# Patient Record
Sex: Male | Born: 1950 | Race: White | Hispanic: No | Marital: Married | Smoking: Never smoker
Health system: Southern US, Community
[De-identification: ages and names within clinical notes are randomized; demographics above are authoritative.]

## PROBLEM LIST (undated history)

## (undated) DIAGNOSIS — S3210XA Unspecified fracture of sacrum, initial encounter for closed fracture: Secondary | ICD-10-CM

## (undated) DIAGNOSIS — M1990 Unspecified osteoarthritis, unspecified site: Secondary | ICD-10-CM

## (undated) DIAGNOSIS — E785 Hyperlipidemia, unspecified: Secondary | ICD-10-CM

## (undated) DIAGNOSIS — C801 Malignant (primary) neoplasm, unspecified: Secondary | ICD-10-CM

## (undated) DIAGNOSIS — R51 Headache: Secondary | ICD-10-CM

## (undated) DIAGNOSIS — D509 Iron deficiency anemia, unspecified: Secondary | ICD-10-CM

## (undated) DIAGNOSIS — I10 Essential (primary) hypertension: Secondary | ICD-10-CM

## (undated) DIAGNOSIS — F32A Depression, unspecified: Secondary | ICD-10-CM

## (undated) DIAGNOSIS — M419 Scoliosis, unspecified: Secondary | ICD-10-CM

## (undated) DIAGNOSIS — IMO0002 Reserved for concepts with insufficient information to code with codable children: Secondary | ICD-10-CM

## (undated) DIAGNOSIS — R011 Cardiac murmur, unspecified: Secondary | ICD-10-CM

## (undated) DIAGNOSIS — I509 Heart failure, unspecified: Secondary | ICD-10-CM

## (undated) DIAGNOSIS — Z9884 Bariatric surgery status: Secondary | ICD-10-CM

## (undated) DIAGNOSIS — K259 Gastric ulcer, unspecified as acute or chronic, without hemorrhage or perforation: Secondary | ICD-10-CM

## (undated) DIAGNOSIS — R569 Unspecified convulsions: Secondary | ICD-10-CM

## (undated) DIAGNOSIS — R55 Syncope and collapse: Secondary | ICD-10-CM

## (undated) DIAGNOSIS — Z8679 Personal history of other diseases of the circulatory system: Secondary | ICD-10-CM

## (undated) DIAGNOSIS — D649 Anemia, unspecified: Secondary | ICD-10-CM

## (undated) DIAGNOSIS — Z8489 Family history of other specified conditions: Secondary | ICD-10-CM

## (undated) DIAGNOSIS — G4730 Sleep apnea, unspecified: Secondary | ICD-10-CM

## (undated) DIAGNOSIS — F1110 Opioid abuse, uncomplicated: Secondary | ICD-10-CM

## (undated) DIAGNOSIS — F419 Anxiety disorder, unspecified: Secondary | ICD-10-CM

## (undated) DIAGNOSIS — Z8719 Personal history of other diseases of the digestive system: Secondary | ICD-10-CM

## (undated) DIAGNOSIS — N209 Urinary calculus, unspecified: Secondary | ICD-10-CM

## (undated) DIAGNOSIS — K219 Gastro-esophageal reflux disease without esophagitis: Secondary | ICD-10-CM

## (undated) DIAGNOSIS — N189 Chronic kidney disease, unspecified: Secondary | ICD-10-CM

## (undated) HISTORY — DX: Bariatric surgery status: Z98.84

## (undated) HISTORY — DX: Chronic kidney disease, unspecified: N18.9

## (undated) HISTORY — DX: Gastric ulcer, unspecified as acute or chronic, without hemorrhage or perforation: K25.9

## (undated) HISTORY — DX: Hyperlipidemia, unspecified: E78.5

## (undated) HISTORY — DX: Cardiac murmur, unspecified: R01.1

## (undated) HISTORY — PX: NASAL SINUS SURGERY: SHX719

## (undated) HISTORY — DX: Unspecified fracture of sacrum, initial encounter for closed fracture: S32.10XA

## (undated) HISTORY — DX: Iron deficiency anemia, unspecified: D50.9

## (undated) HISTORY — DX: Reserved for concepts with insufficient information to code with codable children: IMO0002

## (undated) HISTORY — PX: APPENDECTOMY: SHX54

## (undated) HISTORY — PX: KNEE ARTHROSCOPY: SUR90

## (undated) HISTORY — DX: Personal history of other diseases of the circulatory system: Z86.79

## (undated) HISTORY — DX: Opioid abuse, uncomplicated: F11.10

## (undated) HISTORY — DX: Malignant (primary) neoplasm, unspecified: C80.1

## (undated) HISTORY — PX: CHOLECYSTECTOMY: SHX55

## (undated) HISTORY — DX: Syncope and collapse: R55

## (undated) HISTORY — PX: JOINT REPLACEMENT: SHX530

## (undated) HISTORY — PX: TONSILLECTOMY: SUR1361

## (undated) HISTORY — PX: CARDIAC CATHETERIZATION: SHX172

## (undated) HISTORY — PX: ROUX-EN-Y GASTRIC BYPASS: SHX1104

## (undated) MED FILL — Iron Sucrose Inj 20 MG/ML (Fe Equiv): INTRAVENOUS | Qty: 10 | Status: AC

---

## 1998-08-09 HISTORY — PX: HERNIA REPAIR: SHX51

## 1999-08-10 HISTORY — PX: OSTEOTOMY: SHX137

## 2003-11-13 ENCOUNTER — Other Ambulatory Visit: Payer: Self-pay

## 2004-05-20 ENCOUNTER — Ambulatory Visit: Payer: Self-pay | Admitting: Pain Medicine

## 2004-06-24 ENCOUNTER — Ambulatory Visit: Payer: Self-pay | Admitting: Pain Medicine

## 2004-07-22 ENCOUNTER — Ambulatory Visit: Payer: Self-pay | Admitting: Pain Medicine

## 2004-08-19 ENCOUNTER — Ambulatory Visit: Payer: Self-pay | Admitting: Pain Medicine

## 2004-09-16 ENCOUNTER — Ambulatory Visit: Payer: Self-pay | Admitting: Pain Medicine

## 2004-11-06 ENCOUNTER — Ambulatory Visit: Payer: Self-pay | Admitting: Internal Medicine

## 2004-11-12 ENCOUNTER — Ambulatory Visit: Payer: Self-pay | Admitting: Otolaryngology

## 2004-11-19 ENCOUNTER — Ambulatory Visit: Payer: Self-pay | Admitting: Pain Medicine

## 2004-12-16 ENCOUNTER — Ambulatory Visit: Payer: Self-pay | Admitting: Pain Medicine

## 2005-01-26 ENCOUNTER — Ambulatory Visit: Payer: Self-pay | Admitting: Pain Medicine

## 2005-02-01 ENCOUNTER — Ambulatory Visit: Payer: Self-pay | Admitting: Pain Medicine

## 2005-02-25 ENCOUNTER — Ambulatory Visit: Payer: Self-pay | Admitting: Pain Medicine

## 2005-04-29 ENCOUNTER — Ambulatory Visit: Payer: Self-pay | Admitting: Pain Medicine

## 2005-05-17 ENCOUNTER — Ambulatory Visit: Payer: Self-pay | Admitting: Pain Medicine

## 2005-05-31 ENCOUNTER — Ambulatory Visit: Payer: Self-pay | Admitting: Pain Medicine

## 2005-06-29 ENCOUNTER — Ambulatory Visit: Payer: Self-pay | Admitting: Pain Medicine

## 2005-07-06 ENCOUNTER — Emergency Department (HOSPITAL_COMMUNITY): Admission: EM | Admit: 2005-07-06 | Discharge: 2005-07-06 | Payer: Self-pay | Admitting: Emergency Medicine

## 2005-08-04 ENCOUNTER — Ambulatory Visit: Payer: Self-pay | Admitting: Pain Medicine

## 2005-08-11 ENCOUNTER — Ambulatory Visit: Payer: Self-pay | Admitting: Gastroenterology

## 2005-08-26 ENCOUNTER — Ambulatory Visit: Payer: Self-pay | Admitting: Gastroenterology

## 2005-08-26 ENCOUNTER — Ambulatory Visit: Payer: Self-pay | Admitting: Pain Medicine

## 2005-09-08 ENCOUNTER — Ambulatory Visit: Payer: Self-pay | Admitting: Pain Medicine

## 2005-09-30 ENCOUNTER — Ambulatory Visit: Payer: Self-pay | Admitting: Pain Medicine

## 2005-10-06 ENCOUNTER — Ambulatory Visit: Payer: Self-pay | Admitting: Pain Medicine

## 2005-10-21 ENCOUNTER — Ambulatory Visit: Payer: Self-pay | Admitting: Pain Medicine

## 2005-11-10 ENCOUNTER — Ambulatory Visit: Payer: Self-pay | Admitting: Pain Medicine

## 2005-12-02 ENCOUNTER — Ambulatory Visit: Payer: Self-pay | Admitting: Pain Medicine

## 2005-12-09 ENCOUNTER — Inpatient Hospital Stay: Payer: Self-pay | Admitting: Cardiovascular Disease

## 2005-12-09 ENCOUNTER — Other Ambulatory Visit: Payer: Self-pay

## 2005-12-12 ENCOUNTER — Other Ambulatory Visit: Payer: Self-pay

## 2006-01-05 ENCOUNTER — Ambulatory Visit: Payer: Self-pay | Admitting: Pain Medicine

## 2006-01-21 ENCOUNTER — Ambulatory Visit: Payer: Self-pay | Admitting: Internal Medicine

## 2006-02-01 ENCOUNTER — Ambulatory Visit: Payer: Self-pay | Admitting: Pain Medicine

## 2006-03-03 ENCOUNTER — Ambulatory Visit: Payer: Self-pay | Admitting: Pain Medicine

## 2006-03-07 ENCOUNTER — Ambulatory Visit: Payer: Self-pay | Admitting: Pain Medicine

## 2006-03-17 ENCOUNTER — Ambulatory Visit: Payer: Self-pay | Admitting: Otolaryngology

## 2006-03-29 ENCOUNTER — Ambulatory Visit: Payer: Self-pay | Admitting: Pain Medicine

## 2006-04-06 ENCOUNTER — Other Ambulatory Visit: Payer: Self-pay

## 2006-04-06 ENCOUNTER — Ambulatory Visit: Payer: Self-pay | Admitting: Pain Medicine

## 2006-04-06 ENCOUNTER — Inpatient Hospital Stay: Payer: Self-pay | Admitting: Cardiovascular Disease

## 2006-04-26 ENCOUNTER — Ambulatory Visit: Payer: Self-pay | Admitting: Pain Medicine

## 2007-08-18 ENCOUNTER — Ambulatory Visit: Payer: Self-pay | Admitting: Internal Medicine

## 2007-09-07 ENCOUNTER — Ambulatory Visit: Payer: Self-pay | Admitting: Internal Medicine

## 2008-01-02 ENCOUNTER — Ambulatory Visit (HOSPITAL_COMMUNITY): Admission: RE | Admit: 2008-01-02 | Discharge: 2008-01-02 | Payer: Self-pay | Admitting: Neurosurgery

## 2008-02-06 ENCOUNTER — Ambulatory Visit: Payer: Self-pay | Admitting: Pain Medicine

## 2008-02-14 ENCOUNTER — Ambulatory Visit: Payer: Self-pay | Admitting: Pain Medicine

## 2008-02-28 ENCOUNTER — Ambulatory Visit: Payer: Self-pay | Admitting: Otolaryngology

## 2008-02-28 ENCOUNTER — Other Ambulatory Visit: Payer: Self-pay

## 2008-03-14 ENCOUNTER — Ambulatory Visit: Payer: Self-pay | Admitting: Otolaryngology

## 2008-03-21 ENCOUNTER — Ambulatory Visit: Payer: Self-pay | Admitting: Pain Medicine

## 2008-03-25 ENCOUNTER — Ambulatory Visit: Payer: Self-pay | Admitting: Pain Medicine

## 2008-04-18 ENCOUNTER — Ambulatory Visit: Payer: Self-pay | Admitting: Pain Medicine

## 2008-05-01 ENCOUNTER — Ambulatory Visit: Payer: Self-pay | Admitting: Pain Medicine

## 2008-05-21 ENCOUNTER — Ambulatory Visit: Payer: Self-pay | Admitting: Pain Medicine

## 2008-05-29 ENCOUNTER — Ambulatory Visit: Payer: Self-pay | Admitting: Pain Medicine

## 2008-06-27 ENCOUNTER — Ambulatory Visit: Payer: Self-pay | Admitting: Pain Medicine

## 2008-07-17 ENCOUNTER — Ambulatory Visit: Payer: Self-pay | Admitting: Pain Medicine

## 2008-08-09 HISTORY — PX: GASTRIC BYPASS: SHX52

## 2008-08-15 ENCOUNTER — Ambulatory Visit: Payer: Self-pay | Admitting: Pain Medicine

## 2008-09-25 ENCOUNTER — Ambulatory Visit: Payer: Self-pay | Admitting: Pain Medicine

## 2008-10-24 ENCOUNTER — Ambulatory Visit: Payer: Self-pay | Admitting: Pain Medicine

## 2008-11-04 ENCOUNTER — Ambulatory Visit: Payer: Self-pay | Admitting: Pain Medicine

## 2008-11-26 ENCOUNTER — Ambulatory Visit: Payer: Self-pay | Admitting: Pain Medicine

## 2008-12-04 ENCOUNTER — Ambulatory Visit: Payer: Self-pay | Admitting: Pain Medicine

## 2008-12-26 ENCOUNTER — Ambulatory Visit: Payer: Self-pay | Admitting: Pain Medicine

## 2009-01-28 ENCOUNTER — Ambulatory Visit: Payer: Self-pay | Admitting: Pain Medicine

## 2009-02-25 ENCOUNTER — Ambulatory Visit: Payer: Self-pay | Admitting: Pain Medicine

## 2009-03-05 ENCOUNTER — Ambulatory Visit: Payer: Self-pay | Admitting: Pain Medicine

## 2009-03-27 ENCOUNTER — Ambulatory Visit: Payer: Self-pay | Admitting: Pain Medicine

## 2009-06-30 ENCOUNTER — Ambulatory Visit: Payer: Self-pay | Admitting: Pain Medicine

## 2009-07-29 ENCOUNTER — Ambulatory Visit: Payer: Self-pay | Admitting: Pain Medicine

## 2009-08-09 HISTORY — PX: UVULOPALATOPLASTY: SHX2633

## 2009-08-18 ENCOUNTER — Ambulatory Visit: Payer: Self-pay | Admitting: Pain Medicine

## 2009-09-16 ENCOUNTER — Ambulatory Visit: Payer: Self-pay | Admitting: Pain Medicine

## 2009-10-16 ENCOUNTER — Ambulatory Visit: Payer: Self-pay | Admitting: Pain Medicine

## 2009-10-22 ENCOUNTER — Ambulatory Visit: Payer: Self-pay | Admitting: Pain Medicine

## 2009-11-13 ENCOUNTER — Ambulatory Visit: Payer: Self-pay | Admitting: Pain Medicine

## 2009-11-26 ENCOUNTER — Ambulatory Visit: Payer: Self-pay | Admitting: Pain Medicine

## 2010-01-22 ENCOUNTER — Ambulatory Visit: Payer: Self-pay | Admitting: Pain Medicine

## 2010-01-28 ENCOUNTER — Ambulatory Visit: Payer: Self-pay | Admitting: Pain Medicine

## 2010-02-19 ENCOUNTER — Ambulatory Visit: Payer: Self-pay | Admitting: Pain Medicine

## 2010-03-02 ENCOUNTER — Ambulatory Visit: Payer: Self-pay | Admitting: Pain Medicine

## 2010-10-12 ENCOUNTER — Ambulatory Visit: Payer: Self-pay | Admitting: Pain Medicine

## 2010-11-05 ENCOUNTER — Ambulatory Visit: Payer: Self-pay | Admitting: Pain Medicine

## 2010-11-18 ENCOUNTER — Ambulatory Visit: Payer: Self-pay | Admitting: Pain Medicine

## 2010-12-22 ENCOUNTER — Ambulatory Visit: Payer: Self-pay | Admitting: Pain Medicine

## 2010-12-30 ENCOUNTER — Ambulatory Visit: Payer: Self-pay | Admitting: Pain Medicine

## 2011-01-19 ENCOUNTER — Ambulatory Visit: Payer: Self-pay | Admitting: Pain Medicine

## 2011-01-27 ENCOUNTER — Ambulatory Visit: Payer: Self-pay | Admitting: Pain Medicine

## 2011-02-18 ENCOUNTER — Ambulatory Visit: Payer: Self-pay | Admitting: Pain Medicine

## 2011-03-10 ENCOUNTER — Ambulatory Visit: Payer: Self-pay | Admitting: Pain Medicine

## 2011-03-18 ENCOUNTER — Ambulatory Visit: Payer: Self-pay | Admitting: Pain Medicine

## 2011-03-22 LAB — HM COLONOSCOPY

## 2011-03-24 ENCOUNTER — Ambulatory Visit: Payer: Self-pay | Admitting: Pain Medicine

## 2011-04-15 ENCOUNTER — Ambulatory Visit: Payer: Self-pay | Admitting: Pain Medicine

## 2011-04-21 ENCOUNTER — Ambulatory Visit: Payer: Self-pay | Admitting: Pain Medicine

## 2011-05-13 ENCOUNTER — Ambulatory Visit: Payer: Self-pay | Admitting: Pain Medicine

## 2011-06-02 ENCOUNTER — Ambulatory Visit: Payer: Self-pay | Admitting: Pain Medicine

## 2011-07-08 ENCOUNTER — Ambulatory Visit: Payer: Self-pay | Admitting: Internal Medicine

## 2011-07-10 ENCOUNTER — Ambulatory Visit: Payer: Self-pay | Admitting: Internal Medicine

## 2011-07-12 ENCOUNTER — Ambulatory Visit: Payer: Self-pay | Admitting: Pain Medicine

## 2011-08-10 ENCOUNTER — Ambulatory Visit: Payer: Self-pay | Admitting: Internal Medicine

## 2011-08-11 ENCOUNTER — Ambulatory Visit: Payer: Self-pay | Admitting: Pain Medicine

## 2011-08-16 LAB — IRON AND TIBC
Iron Saturation: 14 %
Iron: 54 ug/dL — ABNORMAL LOW (ref 65–175)
Unbound Iron-Bind.Cap.: 344 ug/dL

## 2011-08-16 LAB — CANCER CENTER HEMOGLOBIN: HGB: 11.2 g/dL — ABNORMAL LOW (ref 13.0–18.0)

## 2011-08-16 LAB — FERRITIN: Ferritin (ARMC): 11 ng/mL (ref 8–388)

## 2011-09-09 ENCOUNTER — Ambulatory Visit: Payer: Self-pay | Admitting: Pain Medicine

## 2011-09-09 LAB — HM COLONOSCOPY

## 2011-09-10 ENCOUNTER — Ambulatory Visit: Payer: Self-pay | Admitting: Internal Medicine

## 2011-10-07 ENCOUNTER — Ambulatory Visit: Payer: Self-pay | Admitting: Pain Medicine

## 2011-10-08 ENCOUNTER — Ambulatory Visit: Payer: Self-pay | Admitting: Internal Medicine

## 2011-10-13 ENCOUNTER — Ambulatory Visit: Payer: Self-pay | Admitting: Pain Medicine

## 2011-10-14 LAB — FERRITIN: Ferritin (ARMC): 11 ng/mL (ref 8–388)

## 2011-11-08 ENCOUNTER — Ambulatory Visit: Payer: Self-pay | Admitting: Internal Medicine

## 2011-11-11 ENCOUNTER — Inpatient Hospital Stay: Payer: Self-pay | Admitting: Internal Medicine

## 2011-11-11 LAB — CBC
HGB: 12.9 g/dL — ABNORMAL LOW (ref 13.0–18.0)
MCH: 31 pg (ref 26.0–34.0)
MCHC: 32.8 g/dL (ref 32.0–36.0)
MCV: 94 fL (ref 80–100)
RDW: 16.9 % — ABNORMAL HIGH (ref 11.5–14.5)
WBC: 9.2 10*3/uL (ref 3.8–10.6)

## 2011-11-11 LAB — COMPREHENSIVE METABOLIC PANEL
Alkaline Phosphatase: 55 U/L (ref 50–136)
Anion Gap: 8 (ref 7–16)
BUN: 41 mg/dL — ABNORMAL HIGH (ref 7–18)
Bilirubin,Total: 0.3 mg/dL (ref 0.2–1.0)
Calcium, Total: 8.9 mg/dL (ref 8.5–10.1)
Chloride: 107 mmol/L (ref 98–107)
Co2: 26 mmol/L (ref 21–32)
EGFR (African American): >60
Potassium: 4.7 mmol/L (ref 3.5–5.1)
SGOT(AST): 19 U/L (ref 15–37)
Total Protein: 7.1 g/dL (ref 6.4–8.2)

## 2011-11-11 LAB — TROPONIN I: Troponin-I: <0.02 ng/mL

## 2011-11-11 LAB — APTT: Activated PTT: 25.5 secs (ref 23.6–35.9)

## 2011-11-12 LAB — BASIC METABOLIC PANEL
Anion Gap: 10 (ref 7–16)
BUN: 37 mg/dL — ABNORMAL HIGH (ref 7–18)
Creatinine: 1.18 mg/dL (ref 0.60–1.30)
EGFR (African American): >60
EGFR (Non-African Amer.): >60
Osmolality: 295 (ref 275–301)
Potassium: 4.6 mmol/L (ref 3.5–5.1)

## 2011-11-12 LAB — CBC WITH DIFFERENTIAL/PLATELET
Basophil #: 0 10*3/uL (ref 0.0–0.1)
Basophil %: 0.1 %
Lymphocyte %: 10.8 %
MCH: 30.9 pg (ref 26.0–34.0)
MCHC: 32.3 g/dL (ref 32.0–36.0)
MCV: 96 fL (ref 80–100)
Monocyte #: 0.2 10*3/uL (ref 0.0–0.7)
Monocyte %: 1.6 %
Neutrophil #: 9.2 10*3/uL — ABNORMAL HIGH (ref 1.4–6.5)
Neutrophil %: 87.4 %
WBC: 10.5 10*3/uL (ref 3.8–10.6)

## 2011-11-12 LAB — LIPASE, BLOOD: Lipase: 164 U/L (ref 73–393)

## 2011-11-12 LAB — HEMOGLOBIN
HGB: 12.3 g/dL — ABNORMAL LOW (ref 13.0–18.0)
HGB: 12.1 g/dL — ABNORMAL LOW (ref 13.0–18.0)

## 2011-11-13 LAB — BASIC METABOLIC PANEL
Calcium, Total: 9.3 mg/dL (ref 8.5–10.1)
Co2: 29 mmol/L (ref 21–32)
Creatinine: 1.23 mg/dL (ref 0.60–1.30)
EGFR (African American): >60
Glucose: 110 mg/dL — ABNORMAL HIGH (ref 65–99)
Osmolality: 291 (ref 275–301)
Potassium: 3.6 mmol/L (ref 3.5–5.1)
Sodium: 143 mmol/L (ref 136–145)

## 2011-11-13 LAB — CBC WITH DIFFERENTIAL/PLATELET
Basophil #: 0 10*3/uL (ref 0.0–0.1)
Basophil %: 0.3 %
Eosinophil %: 0.1 %
HGB: 12.4 g/dL — ABNORMAL LOW (ref 13.0–18.0)
MCH: 29.8 pg (ref 26.0–34.0)
MCHC: 31.5 g/dL — ABNORMAL LOW (ref 32.0–36.0)
MCV: 95 fL (ref 80–100)
Monocyte #: 1.1 10*3/uL — ABNORMAL HIGH (ref 0.0–0.7)
Platelet: 251 10*3/uL (ref 150–440)
WBC: 15.9 10*3/uL — ABNORMAL HIGH (ref 3.8–10.6)

## 2011-11-13 LAB — HEMOGLOBIN: HGB: 11.3 g/dL — ABNORMAL LOW (ref 13.0–18.0)

## 2011-11-14 LAB — CBC WITH DIFFERENTIAL/PLATELET
Basophil %: 0.1 %
Eosinophil #: 0 10*3/uL (ref 0.0–0.7)
Eosinophil %: 0.2 %
HCT: 34.2 % — ABNORMAL LOW (ref 40.0–52.0)
Lymphocyte %: 27.1 %
MCH: 31.3 pg (ref 26.0–34.0)
MCV: 95 fL (ref 80–100)
Monocyte #: 0.8 10*3/uL — ABNORMAL HIGH (ref 0.0–0.7)
Platelet: 173 10*3/uL (ref 150–440)
RBC: 3.6 10*6/uL — ABNORMAL LOW (ref 4.40–5.90)

## 2011-11-14 LAB — BASIC METABOLIC PANEL
Chloride: 105 mmol/L (ref 98–107)
Co2: 28 mmol/L (ref 21–32)
Creatinine: 0.98 mg/dL (ref 0.60–1.30)
EGFR (African American): >60
Glucose: 105 mg/dL — ABNORMAL HIGH (ref 65–99)
Osmolality: 283 (ref 275–301)
Potassium: 4.3 mmol/L (ref 3.5–5.1)

## 2011-11-15 LAB — CBC WITH DIFFERENTIAL/PLATELET
Basophil #: 0 x10 3/mm 3
Basophil %: 0.3 %
Eosinophil #: 0 x10 3/mm 3
Eosinophil %: 0.5 %
HCT: 35.5 % — ABNORMAL LOW
HGB: 11.6 g/dL — ABNORMAL LOW
Lymphocyte %: 30.1 %
Lymphs Abs: 2.8 x10 3/mm 3
MCH: 31.2 pg
MCHC: 32.7 g/dL
MCV: 96 fL
Monocyte #: 0.9 x10 3/mm 3 — ABNORMAL HIGH
Monocyte %: 9.2 %
Neutrophil #: 5.7 x10 3/mm 3
Neutrophil %: 59.9 %
Platelet: 178 x10 3/mm 3
RBC: 3.71 x10 6/mm 3 — ABNORMAL LOW
RDW: 17 % — ABNORMAL HIGH
WBC: 9.5 x10 3/mm 3

## 2011-11-15 LAB — BASIC METABOLIC PANEL
Anion Gap: 5 — ABNORMAL LOW (ref 7–16)
Calcium, Total: 8.8 mg/dL (ref 8.5–10.1)
Chloride: 106 mmol/L (ref 98–107)
Co2: 30 mmol/L (ref 21–32)
Creatinine: 1.02 mg/dL (ref 0.60–1.30)
EGFR (African American): >60
Osmolality: 282 (ref 275–301)
Sodium: 141 mmol/L (ref 136–145)

## 2011-11-25 LAB — CANCER CENTER HEMOGLOBIN: HGB: 12.3 g/dL — ABNORMAL LOW (ref 13.0–18.0)

## 2011-12-08 ENCOUNTER — Ambulatory Visit: Payer: Self-pay

## 2011-12-08 ENCOUNTER — Ambulatory Visit: Payer: Self-pay | Admitting: Internal Medicine

## 2011-12-23 ENCOUNTER — Other Ambulatory Visit: Payer: Self-pay | Admitting: Neurosurgery

## 2011-12-23 DIAGNOSIS — M542 Cervicalgia: Secondary | ICD-10-CM

## 2011-12-23 DIAGNOSIS — M5410 Radiculopathy, site unspecified: Secondary | ICD-10-CM

## 2011-12-23 DIAGNOSIS — M549 Dorsalgia, unspecified: Secondary | ICD-10-CM

## 2011-12-30 ENCOUNTER — Ambulatory Visit
Admission: RE | Admit: 2011-12-30 | Discharge: 2011-12-30 | Disposition: A | Discharge status: Home / Self Care | Payer: BC Managed Care – PPO | Source: Ambulatory Visit | Attending: Neurosurgery | Admitting: Neurosurgery

## 2011-12-30 VITALS — BP 134/75 | HR 56

## 2011-12-30 DIAGNOSIS — M542 Cervicalgia: Secondary | ICD-10-CM

## 2011-12-30 DIAGNOSIS — M5410 Radiculopathy, site unspecified: Secondary | ICD-10-CM

## 2011-12-30 DIAGNOSIS — M549 Dorsalgia, unspecified: Secondary | ICD-10-CM

## 2011-12-30 MED ORDER — IOHEXOL 300 MG/ML  SOLN
10.0000 mL | Freq: Once | INTRAMUSCULAR | Status: AC | PRN
  Administered 2011-12-30: 10 mL via INTRAVENOUS

## 2011-12-30 MED ORDER — MEPERIDINE HCL 100 MG/ML IJ SOLN
75.0000 mg | Freq: Once | INTRAMUSCULAR | Status: AC
  Administered 2011-12-30: 75 mg via INTRAMUSCULAR

## 2011-12-30 MED ORDER — DIAZEPAM 5 MG PO TABS
10.0000 mg | ORAL_TABLET | Freq: Once | ORAL | Status: AC
  Administered 2011-12-30: 10 mg via ORAL

## 2011-12-30 MED ORDER — ONDANSETRON HCL 8 MG PO TABS
8.0000 mg | ORAL_TABLET | Freq: Once | ORAL | Status: AC
  Administered 2011-12-30: 8 mg via ORAL

## 2011-12-30 NOTE — Discharge Instructions (Signed)
Myelogram Discharge Instructions  1. Go home and rest quietly for the next 24 hours.  It is important to lie flat for the next 24 hours.  Get up only to go to the restroom.  You may lie in the bed or on a couch on your back, your stomach, your left side or your right side.  You may have one pillow under your head.  You may have pillows between your knees while you are on your side or under your knees while you are on your back.  2. DO NOT drive today.  Recline the seat as far back as it will go, while still wearing your seat belt, on the way home.  3. You may get up to go to the bathroom as needed.  You may sit up for 10 minutes to eat.  You may resume your normal diet and medications unless otherwise indicated.  Drink lots of extra fluids today and tomorrow.  4. The incidence of headache, nausea, or vomiting is about 5% (one in 20 patients).  If you develop a headache, lie flat and drink plenty of fluids until the headache goes away.  Caffeinated beverages may be helpful.  If you develop severe nausea and vomiting or a headache that does not go away with flat bed rest, call [phone].  5. You may resume normal activities after your 24 hours of bed rest is over; however, do not exert yourself strongly or do any heavy lifting tomorrow. If when you get up you have a headache when standing, go back to bed and force fluids for another 24 hours.  6. Call your physician for a follow-up appointment.  The results of your myelogram will be sent directly to your physician by the following day.  7. If you have any questions or if complications develop after you arrive home, please call [phone].  Discharge instructions have been explained to the patient.  The patient, or the person responsible for the patient, fully understands these instructions.       May resume cymbalta and tramadol on Dec 31, 2011, after 1:00 pm.

## 2011-12-30 NOTE — Progress Notes (Signed)
Pt has stated he has been off tramadol and cymbalta for the past 2 days.

## 2012-01-12 ENCOUNTER — Ambulatory Visit: Payer: Self-pay | Admitting: Internal Medicine

## 2012-01-12 LAB — FERRITIN: Ferritin (ARMC): 15 ng/mL (ref 8–388)

## 2012-01-12 LAB — CANCER CENTER HEMOGLOBIN: HGB: 12.8 g/dL — ABNORMAL LOW (ref 13.0–18.0)

## 2012-01-24 ENCOUNTER — Other Ambulatory Visit: Payer: Self-pay | Admitting: Neurosurgery

## 2012-01-25 ENCOUNTER — Ambulatory Visit (HOSPITAL_COMMUNITY)
Admission: RE | Admit: 2012-01-25 | Discharge: 2012-01-25 | Disposition: A | Discharge status: Home / Self Care | Payer: BC Managed Care – PPO | Source: Ambulatory Visit | Attending: Neurosurgery | Admitting: Neurosurgery

## 2012-01-25 ENCOUNTER — Encounter (HOSPITAL_COMMUNITY): Payer: Self-pay | Admitting: *Deleted

## 2012-01-25 ENCOUNTER — Encounter (HOSPITAL_COMMUNITY)
Admission: RE | Admit: 2012-01-25 | Discharge: 2012-01-25 | Disposition: A | Discharge status: Home / Self Care | Payer: BC Managed Care – PPO | Source: Ambulatory Visit | Attending: Neurosurgery | Admitting: Neurosurgery

## 2012-01-25 DIAGNOSIS — Z01818 Encounter for other preprocedural examination: Secondary | ICD-10-CM | POA: Insufficient documentation

## 2012-01-25 DIAGNOSIS — Z01812 Encounter for preprocedural laboratory examination: Secondary | ICD-10-CM | POA: Insufficient documentation

## 2012-01-25 LAB — CBC
HCT: 40 % (ref 39.0–52.0)
Hemoglobin: 13.5 g/dL (ref 13.0–17.0)
MCHC: 33.8 g/dL (ref 30.0–36.0)

## 2012-01-25 LAB — BASIC METABOLIC PANEL
BUN: 15 mg/dL (ref 6–23)
CO2: 23 mEq/L (ref 19–32)
Chloride: 106 mEq/L (ref 96–112)
GFR calc Af Amer: 82 mL/min — ABNORMAL LOW (ref >90)
Potassium: 3.5 mEq/L (ref 3.5–5.1)

## 2012-01-25 LAB — SURGICAL PCR SCREEN: MRSA, PCR: NEGATIVE

## 2012-01-25 NOTE — Pre-Procedure Instructions (Addendum)
20 AMUN STEMM  01/25/2012   Your procedure is scheduled on:  01/26/12  Report to [facility] at800 AM.  Call this number if you have problems the morning of surgery: [phone]   Remember:   Do not eat food:After Midnight.    Take these medicines the morning of surgery with A SIP OF WATER: hydralizine, metoprolol, protonix cymbalta   Do not wear jewelry, make-up or nail polish.  Do not wear lotions, powders, or perfumes. You may wear deodorant.  Do not shave 48 hours prior to surgery. Men may shave face and neck.  Do not bring valuables to the hospital.  Contacts, dentures or bridgework may not be worn into surgery.  Leave suitcase in the car. After surgery it may be brought to your room.  For patients admitted to the hospital, checkout time is 11:00 AM the day of discharge.   Patients discharged the day of surgery will not be allowed to drive home.  Name and phone number of your driver: beverly wife  Special Instructions: CHG Shower Use Special Wash: 1/2 bottle night before surgery and 1/2 bottle morning of surgery.   Please read over the following fact sheets that you were given: Pain Booklet, Coughing and Deep Breathing and MRSA Information

## 2012-01-25 NOTE — Progress Notes (Signed)
req'd info from dr Lurline [facility] [place] [facility] called to release orders

## 2012-01-26 ENCOUNTER — Ambulatory Visit (HOSPITAL_COMMUNITY): Payer: BC Managed Care – PPO | Admitting: Certified Registered Nurse Anesthetist

## 2012-01-26 ENCOUNTER — Ambulatory Visit (HOSPITAL_COMMUNITY): Payer: BC Managed Care – PPO

## 2012-01-26 ENCOUNTER — Encounter (HOSPITAL_COMMUNITY): Payer: Self-pay | Admitting: *Deleted

## 2012-01-26 ENCOUNTER — Encounter (HOSPITAL_COMMUNITY): Payer: Self-pay | Admitting: Certified Registered Nurse Anesthetist

## 2012-01-26 ENCOUNTER — Encounter (HOSPITAL_COMMUNITY)
Admission: RE | Disposition: A | Discharge status: Home / Self Care | Payer: Self-pay | Source: Ambulatory Visit | Attending: Neurosurgery

## 2012-01-26 ENCOUNTER — Inpatient Hospital Stay (HOSPITAL_COMMUNITY)
Admission: RE | Admit: 2012-01-26 | Discharge: 2012-01-28 | DRG: 865 | Disposition: A | Discharge status: Home / Self Care | Payer: BC Managed Care – PPO | Source: Ambulatory Visit | Attending: Neurosurgery | Admitting: Neurosurgery

## 2012-01-26 DIAGNOSIS — M5030 Other cervical disc degeneration, unspecified cervical region: Principal | ICD-10-CM | POA: Diagnosis present

## 2012-01-26 DIAGNOSIS — Z9089 Acquired absence of other organs: Secondary | ICD-10-CM

## 2012-01-26 DIAGNOSIS — M5380 Other specified dorsopathies, site unspecified: Secondary | ICD-10-CM | POA: Diagnosis present

## 2012-01-26 DIAGNOSIS — M129 Arthropathy, unspecified: Secondary | ICD-10-CM | POA: Diagnosis present

## 2012-01-26 DIAGNOSIS — G4730 Sleep apnea, unspecified: Secondary | ICD-10-CM | POA: Diagnosis present

## 2012-01-26 DIAGNOSIS — K219 Gastro-esophageal reflux disease without esophagitis: Secondary | ICD-10-CM | POA: Diagnosis present

## 2012-01-26 DIAGNOSIS — R1319 Other dysphagia: Secondary | ICD-10-CM | POA: Diagnosis present

## 2012-01-26 DIAGNOSIS — E119 Type 2 diabetes mellitus without complications: Secondary | ICD-10-CM | POA: Diagnosis present

## 2012-01-26 DIAGNOSIS — I10 Essential (primary) hypertension: Secondary | ICD-10-CM | POA: Diagnosis present

## 2012-01-26 DIAGNOSIS — Z01812 Encounter for preprocedural laboratory examination: Secondary | ICD-10-CM

## 2012-01-26 DIAGNOSIS — F411 Generalized anxiety disorder: Secondary | ICD-10-CM | POA: Diagnosis present

## 2012-01-26 DIAGNOSIS — Z87442 Personal history of urinary calculi: Secondary | ICD-10-CM

## 2012-01-26 DIAGNOSIS — Z881 Allergy status to other antibiotic agents status: Secondary | ICD-10-CM

## 2012-01-26 DIAGNOSIS — K449 Diaphragmatic hernia without obstruction or gangrene: Secondary | ICD-10-CM | POA: Diagnosis present

## 2012-01-26 DIAGNOSIS — Z9884 Bariatric surgery status: Secondary | ICD-10-CM

## 2012-01-26 DIAGNOSIS — Z888 Allergy status to other drugs, medicaments and biological substances status: Secondary | ICD-10-CM

## 2012-01-26 HISTORY — PX: ANTERIOR CERVICAL DECOMP/DISCECTOMY FUSION: SHX1161

## 2012-01-26 HISTORY — DX: Urinary calculus, unspecified: N20.9

## 2012-01-26 HISTORY — DX: Anemia, unspecified: D64.9

## 2012-01-26 HISTORY — DX: Anxiety disorder, unspecified: F41.9

## 2012-01-26 HISTORY — DX: Unspecified osteoarthritis, unspecified site: M19.90

## 2012-01-26 HISTORY — DX: Headache: R51

## 2012-01-26 HISTORY — DX: Essential (primary) hypertension: I10

## 2012-01-26 HISTORY — DX: Gastro-esophageal reflux disease without esophagitis: K21.9

## 2012-01-26 HISTORY — DX: Sleep apnea, unspecified: G47.30

## 2012-01-26 HISTORY — DX: Personal history of other diseases of the digestive system: Z87.19

## 2012-01-26 LAB — GLUCOSE, CAPILLARY

## 2012-01-26 SURGERY — ANTERIOR CERVICAL DECOMPRESSION/DISCECTOMY FUSION 3 LEVELS
Anesthesia: General | Site: Neck | Wound class: Clean

## 2012-01-26 MED ORDER — HYDROMORPHONE HCL PF 1 MG/ML IJ SOLN
INTRAMUSCULAR | Status: AC
  Filled 2012-01-26: qty 1

## 2012-01-26 MED ORDER — THROMBIN 20000 UNITS EX KIT
PACK | CUTANEOUS | Status: DC | PRN
  Administered 2012-01-26: 12:00:00 via TOPICAL

## 2012-01-26 MED ORDER — PROMETHAZINE HCL 25 MG/ML IJ SOLN: 6.2500 mg | INTRAMUSCULAR | Status: DC | PRN

## 2012-01-26 MED ORDER — VECURONIUM BROMIDE 10 MG IV SOLR
INTRAVENOUS | Status: DC | PRN
  Administered 2012-01-26: 3 mg via INTRAVENOUS
  Administered 2012-01-26 (×2): 2 mg via INTRAVENOUS

## 2012-01-26 MED ORDER — LOSARTAN POTASSIUM 50 MG PO TABS
100.0000 mg | ORAL_TABLET | Freq: Every day | ORAL | Status: DC
  Administered 2012-01-26 – 2012-01-28 (×3): 100 mg via ORAL
  Filled 2012-01-26 (×3): qty 2

## 2012-01-26 MED ORDER — 0.9 % SODIUM CHLORIDE (POUR BTL) OPTIME
TOPICAL | Status: DC | PRN
  Administered 2012-01-26: 1000 mL

## 2012-01-26 MED ORDER — ATORVASTATIN CALCIUM 10 MG PO TABS
10.0000 mg | ORAL_TABLET | Freq: Every day | ORAL | Status: DC
  Administered 2012-01-26 – 2012-01-27 (×2): 10 mg via ORAL
  Filled 2012-01-26 (×3): qty 1

## 2012-01-26 MED ORDER — NEOSTIGMINE METHYLSULFATE 1 MG/ML IJ SOLN
INTRAMUSCULAR | Status: DC | PRN
  Administered 2012-01-26: 5 mg via INTRAVENOUS

## 2012-01-26 MED ORDER — ARTIFICIAL TEARS OP OINT
TOPICAL_OINTMENT | OPHTHALMIC | Status: DC | PRN
  Administered 2012-01-26: 1 via OPHTHALMIC

## 2012-01-26 MED ORDER — PANTOPRAZOLE SODIUM 40 MG PO TBEC
40.0000 mg | DELAYED_RELEASE_TABLET | Freq: Two times a day (BID) | ORAL | Status: DC
  Administered 2012-01-26 – 2012-01-28 (×4): 40 mg via ORAL
  Filled 2012-01-26 (×4): qty 1

## 2012-01-26 MED ORDER — THROMBIN 5000 UNITS EX SOLR
CUTANEOUS | Status: DC | PRN
  Administered 2012-01-26 (×2): 5000 [IU] via TOPICAL

## 2012-01-26 MED ORDER — ONDANSETRON HCL 4 MG/2ML IJ SOLN: 4.0000 mg | INTRAMUSCULAR | Status: DC | PRN

## 2012-01-26 MED ORDER — OXYCODONE-ACETAMINOPHEN 5-325 MG PO TABS
1.0000 | ORAL_TABLET | ORAL | Status: DC | PRN
  Administered 2012-01-26 – 2012-01-28 (×10): 2 via ORAL
  Filled 2012-01-26 (×10): qty 2

## 2012-01-26 MED ORDER — SODIUM CHLORIDE 0.9 % IV SOLN: 250.0000 mL | INTRAVENOUS | Status: DC

## 2012-01-26 MED ORDER — MORPHINE SULFATE 4 MG/ML IJ SOLN
4.0000 mg | INTRAMUSCULAR | Status: DC | PRN
  Administered 2012-01-26 – 2012-01-27 (×6): 4 mg via INTRAVENOUS
  Filled 2012-01-26 (×6): qty 1

## 2012-01-26 MED ORDER — EPHEDRINE SULFATE 50 MG/ML IJ SOLN
INTRAMUSCULAR | Status: DC | PRN
  Administered 2012-01-26 (×9): 5 mg via INTRAVENOUS
  Administered 2012-01-26: 10 mg via INTRAVENOUS

## 2012-01-26 MED ORDER — PHENOL 1.4 % MT LIQD: 1.0000 | OROMUCOSAL | Status: DC | PRN

## 2012-01-26 MED ORDER — SUCRALFATE 1 G PO TABS
1.0000 g | ORAL_TABLET | Freq: Four times a day (QID) | ORAL | Status: DC
  Administered 2012-01-26 – 2012-01-28 (×8): 1 g via ORAL
  Filled 2012-01-26 (×10): qty 1

## 2012-01-26 MED ORDER — FUROSEMIDE 20 MG PO TABS
20.0000 mg | ORAL_TABLET | ORAL | Status: DC
  Administered 2012-01-26 – 2012-01-27 (×2): 20 mg via ORAL
  Filled 2012-01-26 (×3): qty 1

## 2012-01-26 MED ORDER — LACTATED RINGERS IV SOLN
INTRAVENOUS | Status: DC | PRN
  Administered 2012-01-26 (×2): via INTRAVENOUS

## 2012-01-26 MED ORDER — ROCURONIUM BROMIDE 100 MG/10ML IV SOLN
INTRAVENOUS | Status: DC | PRN
  Administered 2012-01-26: 50 mg via INTRAVENOUS

## 2012-01-26 MED ORDER — ONDANSETRON HCL 4 MG/2ML IJ SOLN
INTRAMUSCULAR | Status: DC | PRN
  Administered 2012-01-26: 4 mg via INTRAVENOUS

## 2012-01-26 MED ORDER — CEFAZOLIN SODIUM-DEXTROSE 2-3 GM-% IV SOLR
INTRAVENOUS | Status: AC
  Administered 2012-01-26: 2 g via INTRAVENOUS
  Filled 2012-01-26: qty 50

## 2012-01-26 MED ORDER — MUPIROCIN 2 % EX OINT
1.0000 "application " | TOPICAL_OINTMENT | Freq: Two times a day (BID) | CUTANEOUS | Status: DC
  Administered 2012-01-26 – 2012-01-28 (×4): 1 via NASAL
  Filled 2012-01-26: qty 22

## 2012-01-26 MED ORDER — LIDOCAINE HCL 4 % MT SOLN
OROMUCOSAL | Status: DC | PRN
  Administered 2012-01-26: 4 mL via TOPICAL

## 2012-01-26 MED ORDER — ACETAMINOPHEN 650 MG RE SUPP: 650.0000 mg | RECTAL | Status: DC | PRN

## 2012-01-26 MED ORDER — INSULIN ASPART 100 UNIT/ML ~~LOC~~ SOLN
0.0000 [IU] | Freq: Three times a day (TID) | SUBCUTANEOUS | Status: DC
  Administered 2012-01-27: 5 [IU] via SUBCUTANEOUS
  Administered 2012-01-27 (×2): 2 [IU] via SUBCUTANEOUS
  Administered 2012-01-28: 8 [IU] via SUBCUTANEOUS

## 2012-01-26 MED ORDER — ZOLPIDEM TARTRATE 10 MG PO TABS: 10.0000 mg | ORAL_TABLET | Freq: Every evening | ORAL | Status: DC | PRN

## 2012-01-26 MED ORDER — DULOXETINE HCL 60 MG PO CPEP
60.0000 mg | ORAL_CAPSULE | Freq: Every day | ORAL | Status: DC
  Administered 2012-01-27 – 2012-01-28 (×2): 60 mg via ORAL
  Filled 2012-01-26 (×2): qty 1

## 2012-01-26 MED ORDER — SODIUM CHLORIDE 0.9 % IJ SOLN: 3.0000 mL | INTRAMUSCULAR | Status: DC | PRN

## 2012-01-26 MED ORDER — MENTHOL 3 MG MT LOZG
1.0000 | LOZENGE | OROMUCOSAL | Status: DC | PRN
  Filled 2012-01-26: qty 9

## 2012-01-26 MED ORDER — CEFAZOLIN SODIUM 1-5 GM-% IV SOLN
1.0000 g | Freq: Three times a day (TID) | INTRAVENOUS | Status: AC
  Administered 2012-01-26 – 2012-01-27 (×2): 1 g via INTRAVENOUS
  Filled 2012-01-26 (×2): qty 50

## 2012-01-26 MED ORDER — HYDROMORPHONE HCL PF 1 MG/ML IJ SOLN
0.2500 mg | INTRAMUSCULAR | Status: DC | PRN
  Administered 2012-01-26 (×4): 0.5 mg via INTRAVENOUS

## 2012-01-26 MED ORDER — DIAZEPAM 5 MG PO TABS
5.0000 mg | ORAL_TABLET | Freq: Four times a day (QID) | ORAL | Status: DC | PRN
  Administered 2012-01-26 – 2012-01-28 (×6): 5 mg via ORAL
  Filled 2012-01-26 (×5): qty 1

## 2012-01-26 MED ORDER — SODIUM CHLORIDE 0.9 % IJ SOLN
3.0000 mL | Freq: Two times a day (BID) | INTRAMUSCULAR | Status: DC
  Administered 2012-01-27 – 2012-01-28 (×3): 3 mL via INTRAVENOUS

## 2012-01-26 MED ORDER — INSULIN DETEMIR 100 UNIT/ML ~~LOC~~ SOLN
26.0000 [IU] | Freq: Every day | SUBCUTANEOUS | Status: DC
  Administered 2012-01-26 – 2012-01-27 (×2): 26 [IU] via SUBCUTANEOUS
  Filled 2012-01-26: qty 10

## 2012-01-26 MED ORDER — MIDAZOLAM HCL 5 MG/5ML IJ SOLN
INTRAMUSCULAR | Status: DC | PRN
  Administered 2012-01-26: 2 mg via INTRAVENOUS

## 2012-01-26 MED ORDER — ACETAMINOPHEN 325 MG PO TABS: 650.0000 mg | ORAL_TABLET | ORAL | Status: DC | PRN

## 2012-01-26 MED ORDER — MEPERIDINE HCL 25 MG/ML IJ SOLN: 6.2500 mg | INTRAMUSCULAR | Status: DC | PRN

## 2012-01-26 MED ORDER — HEMOSTATIC AGENTS (NO CHARGE) OPTIME
TOPICAL | Status: DC | PRN
  Administered 2012-01-26: 1 via TOPICAL

## 2012-01-26 MED ORDER — SODIUM CHLORIDE 0.9 % IV SOLN
INTRAVENOUS | Status: DC
  Administered 2012-01-26: 23:00:00 via INTRAVENOUS

## 2012-01-26 MED ORDER — LIDOCAINE HCL (CARDIAC) 20 MG/ML IV SOLN
INTRAVENOUS | Status: DC | PRN
  Administered 2012-01-26: 100 mg via INTRAVENOUS

## 2012-01-26 MED ORDER — PROPOFOL 10 MG/ML IV EMUL
INTRAVENOUS | Status: DC | PRN
  Administered 2012-01-26: 200 mg via INTRAVENOUS

## 2012-01-26 MED ORDER — METOPROLOL TARTRATE 100 MG PO TABS
200.0000 mg | ORAL_TABLET | ORAL | Status: DC
  Administered 2012-01-28: 200 mg via ORAL
  Filled 2012-01-26 (×3): qty 2

## 2012-01-26 MED ORDER — GLYCOPYRROLATE 0.2 MG/ML IJ SOLN
INTRAMUSCULAR | Status: DC | PRN
  Administered 2012-01-26: .9 mg via INTRAVENOUS

## 2012-01-26 MED ORDER — DROPERIDOL 2.5 MG/ML IJ SOLN
INTRAMUSCULAR | Status: DC | PRN
  Administered 2012-01-26: 0.625 mg via INTRAVENOUS

## 2012-01-26 MED ORDER — DIAZEPAM 5 MG PO TABS
ORAL_TABLET | ORAL | Status: AC
  Filled 2012-01-26: qty 1

## 2012-01-26 MED ORDER — HYDRALAZINE HCL 50 MG PO TABS
100.0000 mg | ORAL_TABLET | Freq: Two times a day (BID) | ORAL | Status: DC
  Administered 2012-01-26 – 2012-01-28 (×4): 100 mg via ORAL
  Filled 2012-01-26 (×5): qty 2

## 2012-01-26 MED ORDER — DEXAMETHASONE 4 MG PO TABS
4.0000 mg | ORAL_TABLET | Freq: Four times a day (QID) | ORAL | Status: DC
  Administered 2012-01-26 – 2012-01-28 (×6): 4 mg via ORAL
  Filled 2012-01-26 (×12): qty 1

## 2012-01-26 MED ORDER — DEXAMETHASONE SODIUM PHOSPHATE 4 MG/ML IJ SOLN
4.0000 mg | Freq: Four times a day (QID) | INTRAMUSCULAR | Status: DC
  Administered 2012-01-26 – 2012-01-28 (×2): 4 mg via INTRAVENOUS
  Filled 2012-01-26 (×9): qty 1

## 2012-01-26 MED ORDER — HETASTARCH-ELECTROLYTES 6 % IV SOLN
INTRAVENOUS | Status: DC | PRN
  Administered 2012-01-26: 14:00:00 via INTRAVENOUS

## 2012-01-26 MED ORDER — FENTANYL CITRATE 0.05 MG/ML IJ SOLN
INTRAMUSCULAR | Status: DC | PRN
  Administered 2012-01-26: 25 ug via INTRAVENOUS
  Administered 2012-01-26: 50 ug via INTRAVENOUS
  Administered 2012-01-26: 25 ug via INTRAVENOUS
  Administered 2012-01-26: 50 ug via INTRAVENOUS
  Administered 2012-01-26: 100 ug via INTRAVENOUS

## 2012-01-26 MED ORDER — DEXAMETHASONE SODIUM PHOSPHATE 4 MG/ML IJ SOLN
INTRAMUSCULAR | Status: DC | PRN
  Administered 2012-01-26: 4 mg via INTRAVENOUS

## 2012-01-26 SURGICAL SUPPLY — 74 items
BANDAGE GAUZE ELAST BULKY 4 IN (GAUZE/BANDAGES/DRESSINGS) ×4 IMPLANT
BENZOIN TINCTURE PRP APPL 2/3 (GAUZE/BANDAGES/DRESSINGS) ×2 IMPLANT
BIT DRILL SM SPINE QC 12 (BIT) ×2 IMPLANT
BLADE ULTRA TIP 2M (BLADE) ×2 IMPLANT
BUR BARREL STRAIGHT FLUTE 4.0 (BURR) ×2 IMPLANT
BUR MATCHSTICK NEURO 3.0 LAGG (BURR) ×2 IMPLANT
CANISTER SUCTION 2500CC (MISCELLANEOUS) ×2 IMPLANT
CLOTH BEACON ORANGE TIMEOUT ST (SAFETY) ×2 IMPLANT
CONT SPEC 4OZ CLIKSEAL STRL BL (MISCELLANEOUS) ×2 IMPLANT
COVER MAYO STAND STRL (DRAPES) ×2 IMPLANT
DRAIN JACKSON PRATT 10MM FLAT (MISCELLANEOUS) ×2 IMPLANT
DRAPE LAPAROTOMY 100X72 PEDS (DRAPES) ×2 IMPLANT
DRAPE MICROSCOPE LEICA (MISCELLANEOUS) ×2 IMPLANT
DRAPE POUCH INSTRU U-SHP 10X18 (DRAPES) ×2 IMPLANT
DRAPE PROXIMA HALF (DRAPES) ×2 IMPLANT
DURAPREP 6ML APPLICATOR 50/CS (WOUND CARE) ×2 IMPLANT
ELECT REM PT RETURN 9FT ADLT (ELECTROSURGICAL) ×2
ELECTRODE REM PT RTRN 9FT ADLT (ELECTROSURGICAL) ×1 IMPLANT
GAUZE SPONGE 4X4 16PLY XRAY LF (GAUZE/BANDAGES/DRESSINGS) IMPLANT
GLOVE BIO SURGEON STRL SZ 6.5 (GLOVE) IMPLANT
GLOVE BIO SURGEON STRL SZ7 (GLOVE) IMPLANT
GLOVE BIO SURGEON STRL SZ7.5 (GLOVE) IMPLANT
GLOVE BIO SURGEON STRL SZ8 (GLOVE) IMPLANT
GLOVE BIO SURGEON STRL SZ8.5 (GLOVE) IMPLANT
GLOVE BIOGEL M 8.0 STRL (GLOVE) ×2 IMPLANT
GLOVE BIOGEL PI IND STRL 7.5 (GLOVE) ×1 IMPLANT
GLOVE BIOGEL PI INDICATOR 7.5 (GLOVE) ×1
GLOVE ECLIPSE 6.5 STRL STRAW (GLOVE) IMPLANT
GLOVE ECLIPSE 7.0 STRL STRAW (GLOVE) IMPLANT
GLOVE ECLIPSE 7.5 STRL STRAW (GLOVE) ×2 IMPLANT
GLOVE ECLIPSE 8.0 STRL XLNG CF (GLOVE) IMPLANT
GLOVE ECLIPSE 8.5 STRL (GLOVE) IMPLANT
GLOVE EXAM NITRILE LRG STRL (GLOVE) IMPLANT
GLOVE EXAM NITRILE MD LF STRL (GLOVE) ×2 IMPLANT
GLOVE EXAM NITRILE XL STR (GLOVE) IMPLANT
GLOVE EXAM NITRILE XS STR PU (GLOVE) IMPLANT
GLOVE INDICATOR 6.5 STRL GRN (GLOVE) IMPLANT
GLOVE INDICATOR 7.0 STRL GRN (GLOVE) ×2 IMPLANT
GLOVE INDICATOR 7.5 STRL GRN (GLOVE) IMPLANT
GLOVE INDICATOR 8.0 STRL GRN (GLOVE) ×8 IMPLANT
GLOVE INDICATOR 8.5 STRL (GLOVE) IMPLANT
GLOVE OPTIFIT SS 8.0 STRL (GLOVE) IMPLANT
GLOVE SS BIOGEL STRL SZ 7 (GLOVE) ×2 IMPLANT
GLOVE SUPERSENSE BIOGEL SZ 7 (GLOVE) ×2
GLOVE SURG SS PI 6.5 STRL IVOR (GLOVE) ×4 IMPLANT
GOWN BRE IMP SLV AUR LG STRL (GOWN DISPOSABLE) ×4 IMPLANT
GOWN BRE IMP SLV AUR XL STRL (GOWN DISPOSABLE) ×2 IMPLANT
GOWN STRL REIN 2XL LVL4 (GOWN DISPOSABLE) IMPLANT
HEAD HALTER (SOFTGOODS) IMPLANT
HEMOSTAT POWDER KIT SURGIFOAM (HEMOSTASIS) ×2 IMPLANT
KIT BASIN OR (CUSTOM PROCEDURE TRAY) ×2 IMPLANT
KIT ROOM TURNOVER OR (KITS) ×2 IMPLANT
NEEDLE SPNL 18GX3.5 QUINCKE PK (NEEDLE) ×2 IMPLANT
NEEDLE SPNL 22GX3.5 QUINCKE BK (NEEDLE) ×2 IMPLANT
NS IRRIG 1000ML POUR BTL (IV SOLUTION) ×2 IMPLANT
PACK LAMINECTOMY NEURO (CUSTOM PROCEDURE TRAY) ×2 IMPLANT
PATTIES SURGICAL .5 X1 (DISPOSABLE) ×2 IMPLANT
PLATE ANT CERV XTEND 4 LV 78 (Plate) ×2 IMPLANT
PUTTY DBX 1CC (Putty) ×2 IMPLANT
PUTTY DBX 1CC DEPUY (Putty) ×1 IMPLANT
RUBBERBAND STERILE (MISCELLANEOUS) ×4 IMPLANT
SCREW XTD VAR 4.2 SELF TAP 12 (Screw) ×12 IMPLANT
SCREW XTEND SELFTAP VAR 4.6X14 (Screw) ×8 IMPLANT
SPACER COLONIAL 7X14X12 (Spacer) ×6 IMPLANT
SPONGE GAUZE 4X4 12PLY (GAUZE/BANDAGES/DRESSINGS) ×2 IMPLANT
SPONGE INTESTINAL PEANUT (DISPOSABLE) ×2 IMPLANT
SPONGE SURGIFOAM ABS GEL 100 (HEMOSTASIS) ×4 IMPLANT
STRIP CLOSURE SKIN 1/2X4 (GAUZE/BANDAGES/DRESSINGS) ×2 IMPLANT
SUT VIC AB 3-0 SH 8-18 (SUTURE) ×6 IMPLANT
SYR 20ML ECCENTRIC (SYRINGE) ×2 IMPLANT
TOWEL OR 17X24 6PK STRL BLUE (TOWEL DISPOSABLE) ×2 IMPLANT
TOWEL OR 17X26 10 PK STRL BLUE (TOWEL DISPOSABLE) ×2 IMPLANT
WATER STERILE IRR 1000ML POUR (IV SOLUTION) ×2 IMPLANT
colonial cervical spacer large 0o degree (Spacer) ×2 IMPLANT

## 2012-01-26 NOTE — Transfer of Care (Signed)
Immediate Anesthesia Transfer of Care Note  Patient: Justin Patel  Procedure(s) Performed: Procedure(s) (LRB): ANTERIOR CERVICAL DECOMPRESSION/DISCECTOMY FUSION 3 LEVELS (N/A)  Patient Location: PACU  Anesthesia Type: General  Level of Consciousness: awake, alert  and oriented  Airway & Oxygen Therapy: Patient Spontanous Breathing and Patient connected to nasal cannula oxygen  Post-op Assessment: Report given to PACU RN and Post -op Vital signs reviewed and stable, moving all 4 extremities  Post vital signs: Reviewed and stable  Complications: No apparent anesthesia complications

## 2012-01-26 NOTE — Anesthesia Preprocedure Evaluation (Addendum)
Anesthesia Evaluation  Patient identified by MRN, date of birth, ID band Patient awake    Reviewed: Allergy & Precautions, H&P , NPO status , Patient's Chart, lab work & pertinent test results  History of Anesthesia Complications Negative for: history of anesthetic complications  Airway Mallampati: I  Neck ROM: Full    Dental  (+) Teeth Intact and Dental Advisory Given   Pulmonary neg pulmonary ROS,  breath sounds clear to auscultation        Cardiovascular hypertension, Rhythm:Regular Rate:Normal     Neuro/Psych    GI/Hepatic Neg liver ROS, hiatal hernia, GERD-  ,  Endo/Other  Diabetes mellitus-  Renal/GU      Musculoskeletal   Abdominal   Peds  Hematology   Anesthesia Other Findings   Reproductive/Obstetrics                           Anesthesia Physical Anesthesia Plan  ASA: II  Anesthesia Plan: General   Post-op Pain Management:    Induction: Intravenous  Airway Management Planned: Oral ETT  Additional Equipment:   Intra-op Plan:   Post-operative Plan: Extubation in OR  Informed Consent: I have reviewed the patients History and Physical, chart, labs and discussed the procedure including the risks, benefits and alternatives for the proposed anesthesia with the patient or authorized representative who has indicated his/her understanding and acceptance.   Dental advisory given  Plan Discussed with: CRNA and Surgeon  Anesthesia Plan Comments:         Anesthesia Quick Evaluation

## 2012-01-26 NOTE — Preoperative (Signed)
Beta Blockers   Reason not to administer Beta Blockers:Not Applicable 

## 2012-01-26 NOTE — Anesthesia Postprocedure Evaluation (Signed)
  Anesthesia Post-op Note  Patient: Justin Patel  Procedure(s) Performed: Procedure(s) (LRB): ANTERIOR CERVICAL DECOMPRESSION/DISCECTOMY FUSION 3 LEVELS (N/A)  Patient Location: PACU  Anesthesia Type: General  Level of Consciousness: awake  Airway and Oxygen Therapy: Patient Spontanous Breathing  Post-op Pain: mild  Post-op Assessment: Post-op Vital signs reviewed  Post-op Vital Signs: stable  Complications: No apparent anesthesia complications

## 2012-01-26 NOTE — Progress Notes (Signed)
Pt. Falling asleep, pain stated at a 8 when asked, Valium given prior to transport

## 2012-01-26 NOTE — H&P (Signed)
Justin Patel is an 61 y.o. male.   Chief Complaint: neck pain [id number] with a history of neck pain with radiation to both upper extremities associated with pain. As for late he has had dificulties swallowing with the feeling of choking. Patient had a cervical myelogram and in view of findings he wants to go ahead with surgery  Past Medical History  Diagnosis Date  . Hypertension   . Anxiety   . Sleep apnea     hx not now since wt loss  . Diabetes mellitus   . Stones in the urinary tract   . GERD (gastroesophageal reflux disease)   . H/O hiatal hernia   . Headache   . Arthritis   . Anemia     iron def anemia    Past Surgical History  Procedure Date  . Cardiac catheterization   . Hernia repair 2000    hiatal  . Gastric bypass 10  . Knee arthroscopy     bil,   . Osteotomy     lft  . Tonsillectomy   . Appendectomy   . Nasal sinus surgery     x5  . Uvulopalatoplasty 11    History reviewed. No pertinent family history. Social History:  reports that he has never smoked. He does not have any smokeless tobacco history on file. He reports that he does not drink alcohol or use illicit drugs.  Allergies:  Allergies  Allergen Reactions  . Altace (Ramipril) Anaphylaxis  . Levaquin (Levofloxacin In D5w) Hives  . Iohexol Hives     Desc: HIVES     Medications Prior to Admission  Medication Sig Dispense Refill  . DULoxetine (CYMBALTA) 60 MG capsule Take 60 mg by mouth daily.      . furosemide (LASIX) 20 MG tablet Take 20 mg by mouth 1 day or 1 dose.      . hydrALAZINE (APRESOLINE) 50 MG tablet Take 100 mg by mouth 2 (two) times daily.      . insulin detemir (LEVEMIR) 100 UNIT/ML injection Inject 26 Units into the skin at bedtime and may repeat dose one time if needed.      Marland Kitchen losartan (COZAAR) 50 MG tablet Take 100 mg by mouth daily.      . metoprolol (LOPRESSOR) 100 MG tablet Take 200 mg by mouth 1 day or 1 dose.      . mupirocin ointment (BACTROBAN) 2 % Place 1  application into the nose 2 (two) times daily.      . pantoprazole (PROTONIX) 40 MG tablet Take 40 mg by mouth 2 (two) times daily.      . rosuvastatin (CRESTOR) 10 MG tablet Take 50 mg by mouth daily.      . sucralfate (CARAFATE) 1 G tablet Take 1 g by mouth 4 (four) times daily.        Results for orders placed during the hospital encounter of 01/26/12 (from the past 48 hour(s))  SURGICAL PCR SCREEN     Status: Abnormal   Collection Time   01/25/12  2:21 PM      Component Value Range Comment   MRSA, PCR NEGATIVE  NEGATIVE    Staphylococcus aureus POSITIVE (*) NEGATIVE   BASIC METABOLIC PANEL     Status: Abnormal   Collection Time   01/25/12  2:27 PM      Component Value Range Comment   Sodium 140  135 - 145 mEq/L    Potassium 3.5  3.5 - 5.1 mEq/L  Chloride 106  96 - 112 mEq/L    CO2 23  19 - 32 mEq/L    Glucose, Bld 151 (*) 70 - 99 mg/dL    BUN 15  6 - 23 mg/dL    Creatinine, Ser 4.54  0.50 - 1.35 mg/dL    Calcium 9.4  8.4 - 09.8 mg/dL    GFR calc non Af Amer 71 (*) >90 mL/min    GFR calc Af Amer 82 (*) >90 mL/min   CBC     Status: Abnormal   Collection Time   01/25/12  2:27 PM      Component Value Range Comment   WBC 10.8 (*) 4.0 - 10.5 K/uL    RBC 4.19 (*) 4.22 - 5.81 MIL/uL    Hemoglobin 13.5  13.0 - 17.0 g/dL    HCT 11.9  14.7 - 82.9 %    MCV 95.5  78.0 - 100.0 fL    MCH 32.2  26.0 - 34.0 pg    MCHC 33.8  30.0 - 36.0 g/dL    RDW 56.2  13.0 - 86.5 %    Platelets 240  150 - 400 K/uL   GLUCOSE, CAPILLARY     Status: Abnormal   Collection Time   01/26/12  8:26 AM      Component Value Range Comment   Glucose-Capillary 106 (*) 70 - 99 mg/dL    Dg Chest 2 View  7/84/6962  *RADIOLOGY REPORT*  Clinical Data: Preop for cervical spine surgery.  CHEST - 2 VIEW  Comparison: None.  Findings: Heart size is upper normal.  Thoracic and aorta hilar contours are normal. There is mild pulmonary vascular congestion. No airspace disease, effusion, or pneumothorax.  Surgical clips are  seen near the gastroesophageal junction.  Typical degenerative changes of the thoracic spine.  IMPRESSION: Borderline cardiomegaly with mild pulmonary vascular congestion.  Original Report Authenticated By: Britta Mccreedy, M.D.    Review of Systems  Constitutional: Negative.   HENT: Positive for neck pain.   Eyes: Negative.   Respiratory: Negative.   Cardiovascular: Negative.   Gastrointestinal:       Dysphagia  Genitourinary: Negative.   Skin: Negative.   Neurological: Positive for focal weakness.  Psychiatric/Behavioral: Negative.     Blood pressure 147/88, pulse 65, temperature 98.4 F (36.9 C), temperature source Oral, resp. rate 16, SpO2 98.00%. Physical Exam hent, nl. Neck, decrease of movement of neck with pain.lungs, nl. Cv, nl. Abdomen, scar from previous SURGERY. NEURO weakness of both biceps. Sensory, nl Cervical myelogram LARGE anterior  Osteophyte from c3 to c7 with ddd at34,56, 67 Assessment/Plan Removal of anterior osteophyte from c3 to c7, decompression and fusion at c34, 56, 67 and poss 45. Patient aware of risks and benefits to surgery  Deannie Resetar M 01/26/2012, 11:25 AM

## 2012-01-26 NOTE — Progress Notes (Signed)
Fusion from c3 to c7 done. Op note 130-122

## 2012-01-27 LAB — GLUCOSE, CAPILLARY
Glucose-Capillary: 149 mg/dL — ABNORMAL HIGH (ref 70–99)
Glucose-Capillary: 143 mg/dL — ABNORMAL HIGH (ref 70–99)
Glucose-Capillary: 210 mg/dL — ABNORMAL HIGH (ref 70–99)
Glucose-Capillary: 198 mg/dL — ABNORMAL HIGH (ref 70–99)

## 2012-01-27 NOTE — Progress Notes (Signed)
UR COMPLETED  

## 2012-01-27 NOTE — Op Note (Signed)
NAMEXZAYVIER, FAGIN NO.:  [id number]  MEDICAL RECORD NO.:  [id number]  LOCATION:  3019                         FACILITY:  [facility]  PHYSICIAN:  Hilda Lias, M.D.   DATE OF BIRTH:  August 26, 1950  DATE OF PROCEDURE:  01/26/2012 DATE OF DISCHARGE:                              OPERATIVE REPORT   PREOPERATIVE DIAGNOSES:  Degenerative disk disease.  Chronic radiculopathy.  Dysphagia.  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  POSTOPERATIVE DIAGNOSES:  Degenerative disk disease.  Chronic radiculopathy. Dysphagia. Large anterior osteophyte from C3-4 down to C6- 7.  PROCEDURE:  Removal of large anterior osteophyte producing the dysphagia.  Diskectomy and decompression of the spinal cord at the level of 3-4, 4-5, 5-6, 6-7.  ___Cages_______    screws and plates from Z6-X0.  Microscope.  SURGEON:  Hilda Lias, M.D.  ASSISTANT:  Hewitt Shorts, M.D.  CLINICAL HISTORY:  Mr. Crisman is a 61 year old gentleman whom I saw in the past because of severity of the degenerative disk disease with large osteophyte.  The patient had difficulty swallowing.  The patient was morbid obese.  He ended up having gastric bypass, and when he came back to see my office, he was a different person nevertheless, although, he lost all of the weight, he has difficulty swallowing and lot of neck pain which radiates to the upper extremity associated with weakness of the biceps.  The myelogram done on the patient shows that he has worsening of the anterior osteophyte C3-4 down to C6-C7 with stenosis indicating the disk disease at the level of 3-4, 5-6, 6-7 on both line between 4-5.  Now _clinically _________ the patient has some mild weakness on the deltoid as well as the biceps.  The patient wants to proceed with surgery and he was scheduled for today.  He knew all the risks with that procedure.  PROCEDURE:  The patient was taken to the OR, and after intubation, the left side of  the neck was Cleaned with DuraPrep.  Drapes were applied. Because of short neck, we decided to do a longitudinal incision through the skin, subcutaneous tissue, platysma, straight down to cervical spine.  Immediately, we found a large osteophyte.  We were to insert one needle at the level of 3-4 and the other one right at the level of the large osteophyte.  This one, we had to hammer into the spine.  The x-ray came back with the opening of the C3-C4 and the level _of the_________ large osteophyte right at the level of 4-5.  Then, using [place] as well as the grill, we did removal of the osteophyte from C4 down to C6-7.  From then on, we started looking for the space.  That was the most difficult part because he had some calcification but nevertheless, we were able to see the space.  Then, pushing the vertebral body above and below, we were able to see opening and with a drill, we started drilling anteriorly.  This was done first at the level of C3-C4.  The disk was completely degenerative and calcified.  We were straight down to the posterior ligament which was opened under compression of the C4 nerve root as  well as the compression of the spinal cord was done.  Then, our attention was at the level of C5-6 and 6-7, but we had to drill all the way down posteriorly with the compression of the spinal cord at the 5th, 6th, and 7th nerve root.  Nevertheless, although C4-5 was borderline, I have decided to involve because the weakness that he was having via deltoid with the operative findings of degenerative disk disease at this level.  Turned on this, this level was worse with large osteophyte centrally mostly in the level of C4 and lateral compromise.  At the end, we had good decompression of the C5 nerve root.  Then, _a cage_________ 7 mm height lordotic with autograft and bdx__________ was inserted.  This was followed with the plate using screws downward from 12 to 14 mm.  A total of 10  screws were used.  At the end, the lateral cervical spine showed good position of the cages on the plate.  Surgifoam was used to help with the hemostasis.  I waited 10 minutes just to make sure that we have good hemostasis.  A drain, was left in the precervical__________ area.  Then, the wound was closed with multiple layers of Vicryl and Steri-Strip.  The patient is going to go to PACU.  I am going to wait for him to wake up and then I will make a decision about going to the floor or to stay overnight in the ICU.          ______________________________ Hilda Lias, M.D.     EB/MEDQ  D:  01/26/2012  T:  01/26/2012  Job:  [id number]

## 2012-01-27 NOTE — Evaluation (Signed)
Physical Therapy Evaluation Patient Details Name: Justin Patel MRN: [id number] DOB: October 07, 1950 Today's Date: 01/27/2012 Time: 0865-7846 PT Time Calculation (min): 20 min  PT Assessment / Plan / Recommendation Clinical Impression  Pt s/p ACDF C3-7. Pt at baseline functional level. No acute PT needs, will not follow.    PT Assessment  Patent does not need any further PT services    Follow Up Recommendations  No PT follow up    Barriers to Discharge        lEquipment Recommendations  None recommended by PT          Precautions / Restrictions Precautions Precautions: Cervical Required Braces or Orthoses: Cervical Brace Restrictions Weight Bearing Restrictions: No         Mobility  Bed Mobility Bed Mobility: Supine to Sit;Sitting - Scoot to Edge of Bed Supine to Sit: 7: Independent Sitting - Scoot to [place] of Bed: 7: Independent Transfers Transfers: Sit to Stand;Stand to Sit Sit to Stand: 6: Modified independent (Device/Increase time) Stand to Sit: 6: Modified independent (Device/Increase time) Ambulation/Gait Ambulation/Gait Assistance: 6: Modified independent (Device/Increase time) Ambulation Distance (Feet): 200 Feet Assistive device: None Ambulation/Gait Assistance Details: Pt with osteomy in 2002 with bone positional changes. 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 No balance difficulties Gait Pattern: Within Functional Limits Stairs: Yes Stairs Assistance: 5: Supervision Stairs Assistance Details (indicate cue type and reason): VC for sequencing Stair Management Technique: Two rails;Step to pattern;Forwards Number of Stairs: 5       Visit Information  Last PT Received On: 01/27/12 Assistance Needed: +1 PT/OT Co-Evaluation/Treatment: Yes    Subjective Data      Prior Functioning  Home Living Lives With: Spouse Available Help at Discharge: Family;Available PRN/intermittently Type of Home: House Home Access: Stairs to enter [employer]  of Steps: 3 Entrance Stairs-Rails: Right;Left;Can reach both Home Layout: One level Bathroom Shower/Tub: Health visitor: Handicapped height Home Adaptive Equipment: Shower chair without back;Raised toilet seat with rails Prior Function Level of Independence: Independent Able to Take Stairs?: Yes Driving: Yes Vocation: Full time employment Comments: works as Warden/ranger in [employer] Communication: No difficulties Dominant Hand: Right    Cognition  Overall Cognitive Status: Appears within functional limits for tasks assessed/performed Arousal/Alertness: Awake/alert Orientation Level: Appears intact for tasks assessed Behavior During Session: [facility] for tasks performed    Extremity/Trunk Assessment Right Lower Extremity Assessment RLE ROM/Strength/Tone: Within functional levels RLE Sensation: WFL - Light Touch Left Lower Extremity Assessment LLE ROM/Strength/Tone: Within functional levels LLE Sensation: WFL - Light Touch   Balance    End of Session PT - End of Session Equipment Utilized During Treatment: Gait belt;Cervical collar Activity Tolerance: Patient tolerated treatment well Patient left: in chair;with call bell/phone within reach Nurse Communication: Mobility status   Milana Kidney 01/27/2012, 1:35 PM  01/27/2012 Milana Kidney DPT PAGER: [phone] OFFICE: [phone]

## 2012-01-27 NOTE — Clinical Social Work Note (Signed)
CSW received a consult for SNF. Discussed pt with RN during progression. PT is not recommending any follow. Per PT, pt anticipates discharge home tomorrow. CSW is signing off as no further needs identified. Please reconsult if a need arises prior to discharge.   Dede Query, MSW, Theresia Majors [phone]

## 2012-01-27 NOTE — Progress Notes (Signed)
Orthopedic Tech Progress Note Patient Details:  Justin Patel [birth year] [id number]  Patient ID: Justin Patel, male   DOB: 01/01/1951, 61 y.o.   MRN: [id number]   Justin Patel 01/27/2012, 9:29 AM Called bio-tech for aspen cervical collar

## 2012-01-27 NOTE — Evaluation (Signed)
Occupational Therapy Evaluation Patient Details Name: Justin Patel MRN: [id number] DOB: 08/09/1951 Today's Date: 01/27/2012 Time: 1254-1324 OT Time Calculation (min): 30 min  OT Assessment / Plan / Recommendation Clinical Impression  This 61 y.o male admitted for C3-7 fusion.  Pt. is performing BADLs at supervision to modifiied independent level  All education completed.  No further OT needs identified.    OT Assessment  Patient does not need any further OT services    Follow Up Recommendations  No OT follow up;Supervision - Intermittent    Barriers to Discharge      Equipment Recommendations  None recommended by PT;None recommended by OT    Recommendations for Other Services    Frequency       Precautions / Restrictions Precautions Precautions: Cervical Required Braces or Orthoses: Cervical Brace Cervical Brace: Hard collar (pt. able to verbalize how to don/doff and change pads) Restrictions Weight Bearing Restrictions: No       ADL  Eating/Feeding: Performed;Independent Where Assessed - Eating/Feeding: Bed level Grooming: Simulated;Wash/dry hands;Modified independent Where Assessed - Grooming: Unsupported standing Upper Body Bathing: Simulated;Set up Where Assessed - Upper Body Bathing: Unsupported sitting Lower Body Bathing: Simulated;Supervision/safety Where Assessed - Lower Body Bathing: Unsupported sit to stand Upper Body Dressing: Simulated;Set up Where Assessed - Upper Body Dressing: Unsupported sitting Lower Body Dressing: Performed;Set up Where Assessed - Lower Body Dressing: Unsupported sit to stand Toilet Transfer: Performed;Modified independent Toilet Transfer Method: Sit to stand Toilet Transfer Equipment: Comfort height toilet Toileting - Clothing Manipulation and Hygiene: Performed;Modified independent Where Assessed - Toileting Clothing Manipulation and Hygiene: Standing Tub/Shower Transfer: Simulated;Modified independent Tub/Shower Transfer  Method: Ambulating Tub/Shower Transfer Equipment: Shower seat with back Equipment Used:  (cervical collar) Transfers/Ambulation Related to ADLs: mod I ADL Comments: Pt. is able to perform all BADLs at supervision to mod I level    OT Diagnosis:    OT Problem List:   OT Treatment Interventions:     OT Goals    Visit Information  Last OT Received On: 01/27/12 Assistance Needed: +1 PT/OT Co-Evaluation/Treatment: Yes    Subjective Data  Subjective: "The sensation in my fingers of my left hand is significantly better" Patient Stated Goal: To go home tomorrow   Prior Functioning  Home Living Lives With: Spouse Available Help at Discharge: Family;Available PRN/intermittently Type of Home: House Home Access: Stairs to enter Entrance Stairs-Number of Steps: 3 Entrance Stairs-Rails: Right;Left;Can reach both Home Layout: One level Bathroom Shower/Tub: Walk-in shower Bathroom Toilet: Handicapped height Home Adaptive Equipment: Shower chair without back;Raised toilet seat with rails Prior Function Level of Independence: Independent Able to Take Stairs?: Yes Driving: Yes Vocation: Full time employment 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 Communication Communication: No difficulties Dominant Hand: Right    Cognition  Orientation Level: Appears intact for tasks assessed    Extremity/Trunk Assessment Right Upper Extremity Assessment RUE ROM/Strength/Tone: Within functional levels RUE Sensation: WFL - Light Touch RUE Coordination: WFL - gross/fine motor Left Upper Extremity Assessment LUE ROM/Strength/Tone: Within functional levels (slightly decreased compared to Rt) LUE Sensation: WFL - Light Touch LUE Coordination: WFL - gross/fine motor Right Lower Extremity Assessment RLE ROM/Strength/Tone: Within functional levels RLE Sensation: WFL - Light Touch Left Lower Extremity Assessment LLE ROM/Strength/Tone: Within functional levels LLE Sensation: WFL - Light  Touch   Mobility Bed Mobility Bed Mobility: Supine to Sit;Sitting - Scoot to Edge of Bed Supine to Sit: 7: Independent Sitting - Scoot to Edge of Bed: 7: Independent Transfers Transfers: Sit to Stand;Stand   to Sit Sit to Stand: 6: Modified independent (Device/Increase time) Stand to Sit: 6: Modified independent (Device/Increase time)   Exercise    Balance    End of Session OT - End of Session Equipment Utilized During Treatment: Cervical collar Activity Tolerance: Patient tolerated treatment well Patient left: in chair;with call bell/phone within reach;with nursing in room Nurse Communication: Mobility status   Diannie Willner M 01/27/2012, 1:40 PM  

## 2012-01-27 NOTE — Evaluation (Deleted)
Occupational Therapy Evaluation Patient Details Name: Justin Patel MRN: [id number] DOB: 1950/09/25 Today's Date: 01/27/2012 Time: 5621-3086 OT Time Calculation (min): 30 min  OT Assessment / Plan / Recommendation Clinical Impression  This 61 y.o male admitted for C3-7 fusion.  Pt. is performing BADLs at supervision to modifiied independent level  All education completed.  No further OT needs identified.    OT Assessment  Patient does not need any further OT services    Follow Up Recommendations  No OT follow up;Supervision - Intermittent    Barriers to Discharge      Equipment Recommendations  None recommended by PT;None recommended by OT    Recommendations for Other Services    Frequency       Precautions / Restrictions Precautions Precautions: Cervical Required Braces or Orthoses: Cervical Brace Cervical Brace: Hard collar (pt. able to verbalize how to don/doff and change pads) Restrictions Weight Bearing Restrictions: No       ADL  Eating/Feeding: Performed;Independent Where Assessed - Eating/Feeding: Bed level Grooming: Simulated;Wash/dry hands;Modified independent Where Assessed - Grooming: Unsupported standing Upper Body Bathing: Simulated;Set up Where Assessed - Upper Body Bathing: Unsupported sitting Lower Body Bathing: Simulated;Supervision/safety Where Assessed - Lower Body Bathing: Unsupported sit to stand Upper Body Dressing: Simulated;Set up Where Assessed - Upper Body Dressing: Unsupported sitting Lower Body Dressing: Performed;Set up Where Assessed - Lower Body Dressing: Unsupported sit to stand Toilet Transfer: Performed;Modified independent Toilet Transfer Method: Sit to Barista: Comfort height toilet Toileting - Clothing Manipulation and Hygiene: Performed;Modified independent Where Assessed - Toileting Clothing Manipulation and Hygiene: Standing Tub/Shower Transfer: Simulated;Modified independent Tub/Shower Transfer  Method: Ecologist with back Equipment Used:  (cervical collar) Transfers/Ambulation Related to ADLs: mod I ADL Comments: Pt. is able to perform all BADLs at supervision to mod I level    OT Diagnosis:    OT Problem List:   OT Treatment Interventions:     OT Goals    Visit Information  Last OT Received On: 01/27/12 Assistance Needed: +1 PT/OT Co-Evaluation/Treatment: Yes    Subjective Data  Subjective: "The sensation in my fingers of my left hand is significantly better" Patient Stated Goal: To go home tomorrow   Prior Functioning  Home Living Lives With: Spouse Available Help at Discharge: Family;Available PRN/intermittently Type of Home: House Home Access: Stairs to enter [employer] of Steps: 3 Entrance Stairs-Rails: Right;Left;Can reach both Home Layout: One level Bathroom Shower/Tub: Health visitor: Handicapped height Home Adaptive Equipment: Shower chair without back;Raised toilet seat with rails Prior Function Level of Independence: Independent Able to Take Stairs?: Yes Driving: Yes Vocation: Full time employment Comments: works as Warden/ranger in [employer] Communication: No difficulties Dominant Hand: Right    Cognition  Orientation Level: Appears intact for tasks assessed    Extremity/Trunk Assessment Right Upper Extremity Assessment RUE ROM/Strength/Tone: Within functional levels RUE Sensation: WFL - Light Touch RUE Coordination: WFL - gross/fine motor Left Upper Extremity Assessment LUE ROM/Strength/Tone: Within functional levels (slightly decreased compared to Rt) LUE Sensation: WFL - Light Touch LUE Coordination: WFL - gross/fine motor Right Lower Extremity Assessment RLE ROM/Strength/Tone: Within functional levels RLE Sensation: WFL - Light Touch Left Lower Extremity Assessment LLE ROM/Strength/Tone: Within functional levels LLE Sensation: WFL - Light  Touch   Mobility Bed Mobility Bed Mobility: Supine to Sit;Sitting - Scoot to Edge of Bed Supine to Sit: 7: Independent Sitting - Scoot to Edge of Bed: 7: Independent Transfers Transfers: Sit to [employer]  to Sit Sit to Stand: 6: Modified independent (Device/Increase time) Stand to Sit: 6: Modified independent (Device/Increase time)   Exercise    Balance    End of Session OT - End of Session Equipment Utilized During Treatment: Cervical collar Activity Tolerance: Patient tolerated treatment well Patient left: in chair;with call bell/phone within reach;with nursing in room Nurse Communication: Mobility status   Justin Patel, Ursula Alert M 01/27/2012, 1:40 PM

## 2012-01-28 ENCOUNTER — Encounter (HOSPITAL_COMMUNITY): Payer: Self-pay | Admitting: Neurosurgery

## 2012-01-28 LAB — GLUCOSE, CAPILLARY

## 2012-01-28 NOTE — Discharge Summary (Signed)
Physician Discharge Summary  Patient ID: Justin Patel MRN: [id number] DOB/AGE: 04-27-1951 61 y.o.  Admit date: 01/26/2012 Discharge date: 01/28/2012  Admission Diagnoses:cervical stenosis. dysphagia  Discharge Diagnoses:same    Discharged Condition:no pain  Hospital Course: surgery  Consults:none  Significant Diagnostic Studies:myelogram  Treatments: surgery  Discharge Exam: Blood pressure 185/93, pulse 68, temperature 97.4 F (36.3 C), temperature source Oral, resp. rate 20, height 5\' 7"  (1.702 m), weight 95.255 kg (210 lb), SpO2 94.00%. No weakness. drin sill working.  Disposition: home on percocet,dizepam. He will empty the drain over the w/e. To see me 01/31/12   Medication List  As of 01/28/2012  1:01 PM   ASK your doctor about these medications         DULoxetine 60 MG capsule   Commonly known as: CYMBALTA   Take 60 mg by mouth daily.      furosemide 20 MG tablet   Commonly known as: LASIX   Take 20 mg by mouth 1 day or 1 dose.      hydrALAZINE 50 MG tablet   Commonly known as: APRESOLINE   Take 100 mg by mouth 2 (two) times daily.      insulin detemir 100 UNIT/ML injection   Commonly known as: LEVEMIR   Inject 26 Units into the skin at bedtime and may repeat dose one time if needed.      losartan 50 MG tablet   Commonly known as: COZAAR   Take 100 mg by mouth daily.      metoprolol 100 MG tablet   Commonly known as: LOPRESSOR   Take 200 mg by mouth 1 day or 1 dose.      mupirocin ointment 2 %   Commonly known as: BACTROBAN   Place 1 application into the nose 2 (two) times daily.      pantoprazole 40 MG tablet   Commonly known as: PROTONIX   Take 40 mg by mouth 2 (two) times daily.      rosuvastatin 10 MG tablet   Commonly known as: CRESTOR   Take 50 mg by mouth daily.      sucralfate 1 G tablet   Commonly known as: CARAFATE   Take 1 g by mouth 4 (four) times daily.             Signed: [name] 01/28/2012, 1:01 PM

## 2012-01-28 NOTE — Progress Notes (Signed)
Patient ID: Justin Patel, male   DOB: 05/02/1951, 61 y.o.   MRN: [id number] Doing well, no weakness./ still some drainage. Plan to go home with drain and to see me next week.

## 2012-01-28 NOTE — Care Management Note (Signed)
    Page 1 of 1   01/28/2012     2:40:54 PM   CARE MANAGEMENT NOTE 01/28/2012  Patient:  Justin Patel, Justin Patel   Account Number:  [id number]  Date Initiated:  01/28/2012  Documentation initiated by:  Onnie Boer  Subjective/Objective Assessment:   PT WAS ADMITTED FOR SURGERY     Action/Plan:   PROGRESSION OF CARE AND DISCHARGE PLANNING   Anticipated DC Date:  01/28/2012   Anticipated DC Plan:  HOME/SELF CARE      DC Planning Services  CM consult      Choice offered to / List presented to:             Status of service:  Completed, signed off Medicare Important Message given?   (If response is "NO", the following Medicare IM given date fields will be blank) Date Medicare IM given:   Date Additional Medicare IM given:    Discharge Disposition:  HOME/SELF CARE  Per UR Regulation:  Reviewed for med. necessity/level of care/duration of stay  If discussed at Long Length of Stay Meetings, dates discussed:    Comments:  01/28/12 Onnie Boer, RN, BSN 1440 PT WAS DC'D TO [facility] WITH SELF CARE

## 2012-02-07 ENCOUNTER — Ambulatory Visit: Payer: Self-pay | Admitting: Internal Medicine

## 2012-02-09 LAB — FERRITIN: Ferritin (ARMC): 112 ng/mL (ref 8–388)

## 2012-02-23 LAB — CBC CANCER CENTER
Basophil %: 0.8 %
Eosinophil %: 1.4 %
HCT: 36.8 % — ABNORMAL LOW (ref 40.0–52.0)
HGB: 11.8 g/dL — ABNORMAL LOW (ref 13.0–18.0)
Lymphocyte #: 2.1 x10 3/mm (ref 1.0–3.6)
Lymphocyte %: 32.6 %
MCH: 31 pg (ref 26.0–34.0)
MCV: 97 fL (ref 80–100)
Monocyte #: 0.5 x10 3/mm (ref 0.2–1.0)
Monocyte %: 7.6 %
Neutrophil #: 3.7 x10 3/mm (ref 1.4–6.5)
Platelet: 219 x10 3/mm (ref 150–440)
RBC: 3.81 10*6/uL — ABNORMAL LOW (ref 4.40–5.90)
WBC: 6.4 x10 3/mm (ref 3.8–10.6)

## 2012-02-23 LAB — RETICULOCYTES
Absolute Retic Count: 0.0494 10*6/uL (ref 0.024–0.084)
Reticulocyte: 1.3 % (ref 0.5–1.5)

## 2012-02-23 LAB — IRON AND TIBC
Iron Saturation: 46 %
Unbound Iron-Bind.Cap.: 167 ug/dL

## 2012-02-23 LAB — FERRITIN: Ferritin (ARMC): 76 ng/mL (ref 8–388)

## 2012-03-04 LAB — CBC
HGB: 13.5 g/dL (ref 13.0–18.0)
MCH: 33 pg (ref 26.0–34.0)
MCHC: 34.5 g/dL (ref 32.0–36.0)
MCV: 96 fL (ref 80–100)
Platelet: 169 10*3/uL (ref 150–440)
RBC: 4.07 10*6/uL — ABNORMAL LOW (ref 4.40–5.90)
RDW: 14.2 % (ref 11.5–14.5)
WBC: 10 10*3/uL (ref 3.8–10.6)

## 2012-03-04 LAB — PROTIME-INR: INR: 1

## 2012-03-04 LAB — COMPREHENSIVE METABOLIC PANEL
Alkaline Phosphatase: 82 U/L (ref 50–136)
Chloride: 107 mmol/L (ref 98–107)
Co2: 27 mmol/L (ref 21–32)
Creatinine: 1.13 mg/dL (ref 0.60–1.30)
EGFR (African American): >60
EGFR (Non-African Amer.): >60
SGOT(AST): 22 U/L (ref 15–37)
SGPT (ALT): 28 U/L
Total Protein: 7.3 g/dL (ref 6.4–8.2)

## 2012-03-04 LAB — TROPONIN I: Troponin-I: <0.02 ng/mL

## 2012-03-05 ENCOUNTER — Inpatient Hospital Stay: Payer: Self-pay | Admitting: Internal Medicine

## 2012-03-05 LAB — HEMOGLOBIN
HGB: 12 g/dL — ABNORMAL LOW (ref 13.0–18.0)
HGB: 12.2 g/dL — ABNORMAL LOW (ref 13.0–18.0)

## 2012-03-07 LAB — CBC WITH DIFFERENTIAL/PLATELET
Basophil #: 0 10*3/uL (ref 0.0–0.1)
Eosinophil #: 0.3 10*3/uL (ref 0.0–0.7)
Lymphocyte #: 2.2 10*3/uL (ref 1.0–3.6)
MCH: 33.1 pg (ref 26.0–34.0)
MCHC: 34.7 g/dL (ref 32.0–36.0)
MCV: 95 fL (ref 80–100)
Monocyte #: 0.6 x10 3/mm (ref 0.2–1.0)
Monocyte %: 9.3 %
Neutrophil #: 3.7 10*3/uL (ref 1.4–6.5)
Neutrophil %: 53.8 %
Platelet: 132 10*3/uL — ABNORMAL LOW (ref 150–440)
RDW: 13.8 % (ref 11.5–14.5)
WBC: 6.8 10*3/uL (ref 3.8–10.6)

## 2012-03-09 ENCOUNTER — Ambulatory Visit: Payer: Self-pay | Admitting: Internal Medicine

## 2012-03-21 LAB — HM DIABETES FOOT EXAM

## 2012-03-21 LAB — HM DIABETES EYE EXAM: HM Diabetic Eye Exam: NORMAL

## 2012-04-09 ENCOUNTER — Ambulatory Visit: Payer: Self-pay | Admitting: Internal Medicine

## 2012-05-10 ENCOUNTER — Ambulatory Visit: Payer: Self-pay | Admitting: Internal Medicine

## 2012-05-10 LAB — FERRITIN: Ferritin (ARMC): 45 ng/mL (ref 8–388)

## 2012-06-09 ENCOUNTER — Ambulatory Visit: Payer: Self-pay | Admitting: Internal Medicine

## 2012-06-30 LAB — FERRITIN: Ferritin (ARMC): 121 ng/mL (ref 8–388)

## 2012-06-30 LAB — CANCER CENTER HEMOGLOBIN: HGB: 14.7 g/dL (ref 13.0–18.0)

## 2012-07-09 ENCOUNTER — Ambulatory Visit: Payer: Self-pay | Admitting: Internal Medicine

## 2012-07-28 LAB — FERRITIN: Ferritin (ARMC): 199 ng/mL (ref 8–388)

## 2012-07-28 LAB — CANCER CENTER HEMOGLOBIN: HGB: 14.5 g/dL (ref 13.0–18.0)

## 2012-08-09 ENCOUNTER — Ambulatory Visit: Payer: Self-pay | Admitting: Internal Medicine

## 2012-09-06 LAB — FERRITIN: Ferritin (ARMC): 109 ng/mL (ref 8–388)

## 2012-09-09 ENCOUNTER — Ambulatory Visit: Payer: Self-pay | Admitting: Internal Medicine

## 2012-10-05 LAB — FERRITIN: Ferritin (ARMC): 110 ng/mL (ref 8–388)

## 2012-10-07 ENCOUNTER — Ambulatory Visit: Payer: Self-pay | Admitting: Internal Medicine

## 2012-11-27 ENCOUNTER — Ambulatory Visit: Payer: Self-pay | Admitting: Internal Medicine

## 2012-12-07 ENCOUNTER — Ambulatory Visit: Payer: Self-pay | Admitting: Internal Medicine

## 2012-12-11 LAB — CANCER CENTER HEMOGLOBIN: HGB: 13.5 g/dL (ref 13.0–18.0)

## 2012-12-11 LAB — FERRITIN: Ferritin (ARMC): 108 ng/mL (ref 8–388)

## 2013-01-07 ENCOUNTER — Ambulatory Visit: Payer: Self-pay | Admitting: Internal Medicine

## 2013-02-06 ENCOUNTER — Ambulatory Visit: Payer: Self-pay | Admitting: Internal Medicine

## 2013-02-12 LAB — CANCER CENTER HEMOGLOBIN: HGB: 13.2 g/dL (ref 13.0–18.0)

## 2013-03-09 ENCOUNTER — Ambulatory Visit: Payer: Self-pay | Admitting: Internal Medicine

## 2013-03-17 ENCOUNTER — Emergency Department: Payer: Self-pay | Admitting: Emergency Medicine

## 2013-03-21 ENCOUNTER — Encounter: Payer: Self-pay | Admitting: Internal Medicine

## 2013-03-21 ENCOUNTER — Ambulatory Visit (INDEPENDENT_AMBULATORY_CARE_PROVIDER_SITE_OTHER): Payer: BC Managed Care – PPO | Admitting: Internal Medicine

## 2013-03-21 VITALS — BP 118/70 | HR 74 | Temp 98.2°F | Ht 68.5 in | Wt 208.0 lb

## 2013-03-21 DIAGNOSIS — M545 Low back pain, unspecified: Secondary | ICD-10-CM

## 2013-03-21 DIAGNOSIS — G8929 Other chronic pain: Secondary | ICD-10-CM

## 2013-03-21 DIAGNOSIS — E785 Hyperlipidemia, unspecified: Secondary | ICD-10-CM | POA: Insufficient documentation

## 2013-03-21 DIAGNOSIS — E119 Type 2 diabetes mellitus without complications: Secondary | ICD-10-CM | POA: Insufficient documentation

## 2013-03-21 DIAGNOSIS — E669 Obesity, unspecified: Secondary | ICD-10-CM

## 2013-03-21 DIAGNOSIS — I10 Essential (primary) hypertension: Secondary | ICD-10-CM

## 2013-03-21 DIAGNOSIS — E663 Overweight: Secondary | ICD-10-CM | POA: Insufficient documentation

## 2013-03-21 LAB — HEMOGLOBIN A1C: Hgb A1c MFr Bld: 5.8 % (ref 4.6–6.5)

## 2013-03-21 LAB — MICROALBUMIN / CREATININE URINE RATIO: Microalb Creat Ratio: 5.5 mg/g (ref 0.0–30.0)

## 2013-03-21 NOTE — Assessment & Plan Note (Signed)
BP Readings from Last 3 Encounters:  03/21/13 118/70  01/28/12 185/93  01/28/12 185/93   BP well controlled today on current medications. Will continue. Will check renal function and urine microalbumin with labs.

## 2013-03-21 NOTE — Assessment & Plan Note (Signed)
Pt reports excellent control of BG. Will check A1c with labs today. Eye exam and foot exam are UTD. Immunizations are UTD. Continue current medications.

## 2013-03-21 NOTE — Assessment & Plan Note (Signed)
Will check lipids and LFTs with labs. Continue Crestor. 

## 2013-03-21 NOTE — Assessment & Plan Note (Signed)
S/p gastric bypass surgery. Body mass index is 31.16 kg/(m^2).  Encouraged continued effort at healthy diet and regular exercise. Note that exercise is limited by chronic low back pain and knee pain. Encouraged water activities.

## 2013-03-21 NOTE — Progress Notes (Signed)
Subjective:    Patient ID: Justin Patel, male    DOB: [birth year], 62 y.o.   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  DM - he reports that his diabetes was previously poorly controlled. He was on much higher doses of insulin. This improved dramatically after gastric bypass surgery he lost over 120 pounds. She is now taking 26 units of Levemir daily and reports blood sugars are well controlled, typically less than 100. He is not sure what his last A1c was. He is up-to-date on her foot and eye exam.   Chronic Low Back Pain - he reports a long history of chronic low back pain which started after a fall backwards onto a hard object a couple of years ago in which he fractured his sacrum. He has been followed by an orthopedic surgeon and has been intermittently on pain medications including tramadol and Flexeril with reasonable control of symptoms. He also recently had another fall at home and was seen in the ER and placed on Valium and Percocet to help with pain. He reports improvement with this. He reports that chronic low back pain limits his ability to exercise.  In regards to hypertension and hyperlipidemia, he reports full compliance with his medication. He denies any new concerns today. He notes some difficulty falling asleep over the last couple of weeks because of increased low back pain. He is not currently taking anything for this, except for noted pain medication.  Outpatient Encounter Prescriptions as of 03/21/2013  Medication Sig Dispense Refill  . dexlansoprazole (DEXILANT) 60 MG capsule Take 60 mg by mouth daily.      . DULoxetine (CYMBALTA) 60 MG capsule Take 60 mg by mouth daily.      . furosemide (LASIX) 20 MG tablet Take 20 mg by mouth 1 day or 1 dose.      . hydrALAZINE (APRESOLINE) 50 MG tablet Take 100 mg by mouth 2  (two) times daily.      . insulin detemir (LEVEMIR) 100 UNIT/ML injection Inject 26 Units into the skin at bedtime and may repeat dose one time if needed.      Marland Kitchen losartan (COZAAR) 50 MG tablet Take 100 mg by mouth daily.      . metoprolol (LOPRESSOR) 100 MG tablet Take 200 mg by mouth 1 day or 1 dose.      . rosuvastatin (CRESTOR) 10 MG tablet Take 50 mg by mouth daily.      . sucralfate (CARAFATE) 1 G tablet Take 1 g by mouth 4 (four) times daily.      . mupirocin ointment (BACTROBAN) 2 % Place 1 application into the nose 2 (two) times daily.      . pantoprazole (PROTONIX) 40 MG tablet Take 40 mg by mouth 2 (two) times daily.       No facility-administered encounter medications on file as of 03/21/2013.   BP 118/70  Pulse 74  Temp(Src) 98.2 F (36.8 C) (Oral)  Ht 5' 8.5" (1.74 m)  Wt 208 lb (94.348 kg)  BMI 31.16 kg/m2  SpO2 94%  Review of Systems  Constitutional: Negative for fever, chills, activity change, appetite change, fatigue and unexpected weight change.  Eyes: Negative for visual disturbance.  Respiratory: Negative for cough and shortness of breath.   Cardiovascular: Negative for chest pain, palpitations and leg swelling.  Gastrointestinal: Negative for abdominal pain and  abdominal distention.  Genitourinary: Negative for dysuria, urgency and difficulty urinating.  Musculoskeletal: Positive for myalgias, back pain and arthralgias. Negative for gait problem.  Skin: Negative for color change and rash.  Hematological: Negative for adenopathy.  Psychiatric/Behavioral: Positive for sleep disturbance (attributes to recent pain from back injury). Negative for dysphoric mood. The patient is not nervous/anxious.        Objective:   Physical Exam  Constitutional: He is oriented to person, place, and time. He appears well-developed and well-nourished. No distress.  HENT:  Head: Normocephalic and atraumatic.  Right Ear: External ear normal. Tympanic membrane is scarred.  Left  Ear: External ear normal. A foreign body (tympanostomy tube) is present.  Nose: Nose normal.  Mouth/Throat: Oropharynx is clear and moist. No oropharyngeal exudate.  Eyes: Conjunctivae and EOM are normal. Pupils are equal, round, and reactive to light. Right eye exhibits no discharge. Left eye exhibits no discharge. No scleral icterus.  Neck: Normal range of motion. Neck supple. No tracheal deviation present. No thyromegaly present.    Cardiovascular: Normal rate, regular rhythm and normal heart sounds.  Exam reveals no gallop and no friction rub.   No murmur heard. Pulmonary/Chest: Effort normal and breath sounds normal. No respiratory distress. He has no wheezes. He has no rales. He exhibits no tenderness.  Musculoskeletal: He exhibits no edema.       Lumbar back: He exhibits decreased range of motion, tenderness, bony tenderness, pain and spasm.  Lymphadenopathy:    He has no cervical adenopathy.  Neurological: He is alert and oriented to person, place, and time. No cranial nerve deficit. Coordination normal.  Skin: Skin is warm and dry. No rash noted. He is not diaphoretic. No erythema. No pallor.  Psychiatric: He has a normal mood and affect. His behavior is normal. Judgment and thought content normal.          Assessment & Plan:

## 2013-03-21 NOTE — Assessment & Plan Note (Signed)
Chronic low back pain after work injury in which he fell backward onto hard object causing sacral fracture. Will request notes on evaluation and management from his orthopedic surgeon.

## 2013-03-22 LAB — LIPID PANEL
Cholesterol: 131 mg/dL (ref 0–200)
HDL: 41.2 mg/dL (ref >39.00)
Total CHOL/HDL Ratio: 3
VLDL: 42.2 mg/dL — ABNORMAL HIGH (ref 0.0–40.0)

## 2013-03-22 LAB — COMPREHENSIVE METABOLIC PANEL
ALT: 22 U/L (ref 0–53)
AST: 21 U/L (ref 0–37)
Alkaline Phosphatase: 52 U/L (ref 39–117)
Sodium: 139 mEq/L (ref 135–145)
Total Bilirubin: 0.3 mg/dL (ref 0.3–1.2)
Total Protein: 6.6 g/dL (ref 6.0–8.3)

## 2013-04-06 ENCOUNTER — Other Ambulatory Visit: Payer: Self-pay | Admitting: Internal Medicine

## 2013-04-10 NOTE — Telephone Encounter (Signed)
Eprescribed.

## 2013-06-12 ENCOUNTER — Other Ambulatory Visit: Payer: Self-pay | Admitting: Internal Medicine

## 2013-06-12 NOTE — Telephone Encounter (Signed)
Eprescribed.

## 2013-06-28 ENCOUNTER — Ambulatory Visit: Payer: BC Managed Care – PPO | Admitting: Internal Medicine

## 2013-07-10 ENCOUNTER — Encounter: Payer: Self-pay | Admitting: *Deleted

## 2013-07-10 ENCOUNTER — Encounter: Payer: Self-pay | Admitting: Internal Medicine

## 2013-07-10 ENCOUNTER — Encounter (INDEPENDENT_AMBULATORY_CARE_PROVIDER_SITE_OTHER): Payer: Self-pay

## 2013-07-10 ENCOUNTER — Ambulatory Visit (INDEPENDENT_AMBULATORY_CARE_PROVIDER_SITE_OTHER): Payer: BC Managed Care – PPO | Admitting: Internal Medicine

## 2013-07-10 ENCOUNTER — Other Ambulatory Visit: Payer: Self-pay | Admitting: *Deleted

## 2013-07-10 VITALS — BP 126/80 | HR 66 | Resp 12 | Ht 67.0 in | Wt 197.5 lb

## 2013-07-10 DIAGNOSIS — E119 Type 2 diabetes mellitus without complications: Secondary | ICD-10-CM

## 2013-07-10 DIAGNOSIS — I10 Essential (primary) hypertension: Secondary | ICD-10-CM

## 2013-07-10 DIAGNOSIS — G8929 Other chronic pain: Secondary | ICD-10-CM

## 2013-07-10 DIAGNOSIS — E669 Obesity, unspecified: Secondary | ICD-10-CM

## 2013-07-10 DIAGNOSIS — M545 Low back pain: Secondary | ICD-10-CM

## 2013-07-10 DIAGNOSIS — Z23 Encounter for immunization: Secondary | ICD-10-CM

## 2013-07-10 LAB — COMPREHENSIVE METABOLIC PANEL
AST: 18 U/L (ref 0–37)
Albumin: 3.7 g/dL (ref 3.5–5.2)
BUN: 16 mg/dL (ref 6–23)
CO2: 25 mEq/L (ref 19–32)
Calcium: 9.1 mg/dL (ref 8.4–10.5)
Chloride: 108 mEq/L (ref 96–112)
Creatinine, Ser: 1 mg/dL (ref 0.4–1.5)
GFR: 78.55 mL/min (ref >60.00)
Glucose, Bld: 113 mg/dL — ABNORMAL HIGH (ref 70–99)
Potassium: 4.3 mEq/L (ref 3.5–5.1)

## 2013-07-10 LAB — HEMOGLOBIN A1C: Hgb A1c MFr Bld: 6.2 % (ref 4.6–6.5)

## 2013-07-10 MED ORDER — HYDRALAZINE HCL 50 MG PO TABS: 100.0000 mg | ORAL_TABLET | Freq: Every day | ORAL | Status: DC

## 2013-07-10 MED ORDER — DULOXETINE HCL 60 MG PO CPEP: 60.0000 mg | ORAL_CAPSULE | Freq: Every day | ORAL | Status: DC

## 2013-07-10 MED ORDER — ROSUVASTATIN CALCIUM 10 MG PO TABS: 10.0000 mg | ORAL_TABLET | Freq: Every day | ORAL | Status: DC

## 2013-07-10 MED ORDER — FUROSEMIDE 20 MG PO TABS: 20.0000 mg | ORAL_TABLET | Freq: Every day | ORAL | Status: DC

## 2013-07-10 MED ORDER — METOPROLOL SUCCINATE ER 200 MG PO TB24: 200.0000 mg | ORAL_TABLET | Freq: Every day | ORAL | Status: DC

## 2013-07-10 MED ORDER — INSULIN DETEMIR 100 UNIT/ML FLEXPEN: PEN_INJECTOR | SUBCUTANEOUS | Status: DC

## 2013-07-10 MED ORDER — ZOSTER VACCINE LIVE 19400 UNT/0.65ML ~~LOC~~ SOLR: 0.6500 mL | Freq: Once | SUBCUTANEOUS | Status: DC

## 2013-07-10 MED ORDER — DEXLANSOPRAZOLE 60 MG PO CPDR: 60.0000 mg | DELAYED_RELEASE_CAPSULE | Freq: Every day | ORAL | Status: DC

## 2013-07-10 NOTE — Assessment & Plan Note (Signed)
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 Minimal improvement with Cymbalta, Tramadol, Hydrocodone. Unable to take NSAIDS because of h/o gastric ulcer. Encouraged him to follow up with pain management. Discussion in progress about possible nerve stimulator. Also question whether he might benefit from medication which is longer acting such as Nucynta.

## 2013-07-10 NOTE — Assessment & Plan Note (Signed)
Wt Readings from Last 3 Encounters:  07/10/13 197 lb 8 oz (89.585 kg)  03/21/13 208 lb (94.348 kg)  01/26/12 210 lb (95.255 kg)   Congratulated pt on weight loss. Encouraged continued healthy diet and regular exercise.

## 2013-07-10 NOTE — Assessment & Plan Note (Signed)
BP Readings from Last 3 Encounters:  07/10/13 126/80  03/21/13 118/70  01/28/12 185/93   BP well controlled on current medications. Will continue. Will check renal function with labs today.

## 2013-07-10 NOTE — Assessment & Plan Note (Signed)
Excellent control of blood sugars. Will check A1c with labs today. Continue Levemir.

## 2013-07-10 NOTE — Progress Notes (Signed)
Pre visit review using our clinic review tool, if applicable. No additional management support is needed unless otherwise documented below in the visit note. 

## 2013-07-10 NOTE — Progress Notes (Signed)
Subjective:    Patient ID: LLIAM HOH, male    DOB: [birth year], 62 y.o.   MRN: [id number]  HPI 62 year old male with history of diabetes, hypertension, obesity status post gastric bypass, and chronic low back pain presents for followup. He reports that blood sugars have been well-controlled. He is compliant with his medication. He did not bring a log of blood sugars with him today.  In regards to low back pain, he describes it as severe. Pain is typically rated as 7/10, described as aching pain it does not generally radiate. It is made worse by physical activity. 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 He is followed at the pain clinic and has tried using Cymbalta, tramadol, and hydrocodone with minimal improvement. He was unable to tolerate Lyrica because of swelling. He is not able to take nonsteroidal drugs because of history of gastric ulcer. He reports that they're discussing using I nerve stimulator.  Outpatient Prescriptions Prior to Visit  Medication Sig Dispense Refill  . losartan (COZAAR) 100 MG tablet take 1 tablet by mouth once daily  30 tablet  5  . mupirocin ointment (BACTROBAN) 2 % Place 1 application into the nose 2 (two) times daily.      Marland Kitchen dexlansoprazole (DEXILANT) 60 MG capsule Take 60 mg by mouth daily.      . DULoxetine (CYMBALTA) 60 MG capsule Take 60 mg by mouth daily.      . furosemide (LASIX) 20 MG tablet Take 20 mg by mouth daily.       . hydrALAZINE (APRESOLINE) 50 MG tablet Take 100 mg by mouth daily.       Marland Kitchen LEVEMIR FLEXTOUCH 100 UNIT/ML SOPN INJECT 20 UNITS SUBCUTANEOUSLY AT BEDTIME OR AS DIRECTED  15 mL  3  . sucralfate (CARAFATE) 1 G tablet Take 1 g by mouth 2 (two) times daily.       . insulin detemir (LEVEMIR) 100 UNIT/ML injection Inject 26 Units into the skin at bedtime and may repeat dose one time if needed.      Marland Kitchen losartan (COZAAR) 50 MG tablet Take 100 mg by mouth daily.      . metoprolol (LOPRESSOR) 100 MG tablet  Take 200 mg by mouth 1 day or 1 dose.      . pantoprazole (PROTONIX) 40 MG tablet Take 40 mg by mouth 2 (two) times daily.      . rosuvastatin (CRESTOR) 10 MG tablet Take 10 mg by mouth daily.        No facility-administered medications prior to visit.   BP 126/80  Pulse 66  Resp 12  Ht 5\' 7"  (1.702 m)  Wt 197 lb 8 oz (89.585 kg)  BMI 30.93 kg/m2  SpO2 97%  Review of Systems  Constitutional: Negative for fever, chills, activity change, appetite change, fatigue and unexpected weight change.  Eyes: Negative for visual disturbance.  Respiratory: Negative for cough and shortness of breath.   Cardiovascular: Negative for chest pain, palpitations and leg swelling.  Gastrointestinal: Negative for abdominal pain and abdominal distention.  Genitourinary: Negative for dysuria, urgency and difficulty urinating.  Musculoskeletal: Positive for arthralgias, back pain and myalgias. Negative for gait problem.  Skin: Negative for color change and rash.  Hematological: Negative for adenopathy.  Psychiatric/Behavioral: Negative for sleep disturbance and dysphoric mood. The patient is not nervous/anxious.        Objective:   Physical Exam  Constitutional: He is oriented to person, place, and  time. He appears well-developed and well-nourished. No distress.  HENT:  Head: Normocephalic and atraumatic.  Right Ear: External ear normal.  Left Ear: External ear normal.  Nose: Nose normal.  Mouth/Throat: Oropharynx is clear and moist. No oropharyngeal exudate.  Eyes: Conjunctivae and EOM are normal. Pupils are equal, round, and reactive to light. Right eye exhibits no discharge. Left eye exhibits no discharge. No scleral icterus.  Neck: Normal range of motion. Neck supple. No tracheal deviation present. No thyromegaly present.  Cardiovascular: Normal rate, regular rhythm and normal heart sounds.  Exam reveals no gallop and no friction rub.   No murmur heard. Pulmonary/Chest: Effort normal and breath  sounds normal. No respiratory distress. He has no wheezes. He has no rales. He exhibits no tenderness.  Musculoskeletal: He exhibits no edema.       Lumbar back: He exhibits decreased range of motion and pain. He exhibits no tenderness, no bony tenderness and no deformity.  Lymphadenopathy:    He has no cervical adenopathy.  Neurological: He is alert and oriented to person, place, and time. No cranial nerve deficit. Coordination normal.  Skin: Skin is warm and dry. No rash noted. He is not diaphoretic. No erythema. No pallor.  Psychiatric: He has a normal mood and affect. His behavior is normal. Judgment and thought content normal.          Assessment & Plan:

## 2013-07-21 ENCOUNTER — Inpatient Hospital Stay: Payer: Self-pay | Admitting: Internal Medicine

## 2013-07-21 ENCOUNTER — Ambulatory Visit: Payer: Self-pay | Admitting: Neurology

## 2013-07-21 LAB — COMPREHENSIVE METABOLIC PANEL
Alkaline Phosphatase: 50 U/L
Anion Gap: 8 (ref 7–16)
BUN: 37 mg/dL — ABNORMAL HIGH (ref 7–18)
Calcium, Total: 7.9 mg/dL — ABNORMAL LOW (ref 8.5–10.1)
Chloride: 108 mmol/L — ABNORMAL HIGH (ref 98–107)
EGFR (African American): 53 — ABNORMAL LOW
EGFR (Non-African Amer.): 46 — ABNORMAL LOW
Osmolality: 303 (ref 275–301)
SGOT(AST): 15 U/L (ref 15–37)
SGPT (ALT): 16 U/L (ref 12–78)
Sodium: 142 mmol/L (ref 136–145)

## 2013-07-21 LAB — CBC
HCT: 30 % — ABNORMAL LOW (ref 40.0–52.0)
HGB: 9.6 g/dL — ABNORMAL LOW (ref 13.0–18.0)
Platelet: 175 10*3/uL (ref 150–440)
WBC: 9.8 10*3/uL (ref 3.8–10.6)

## 2013-07-21 LAB — HEMOGLOBIN: HGB: 8.1 g/dL — ABNORMAL LOW (ref 13.0–18.0)

## 2013-07-22 LAB — COMPREHENSIVE METABOLIC PANEL
Anion Gap: 7 (ref 7–16)
Bilirubin,Total: 0.4 mg/dL (ref 0.2–1.0)
Calcium, Total: 7.6 mg/dL — ABNORMAL LOW (ref 8.5–10.1)
Co2: 22 mmol/L (ref 21–32)
EGFR (African American): >60
Glucose: 95 mg/dL (ref 65–99)
Potassium: 3.3 mmol/L — ABNORMAL LOW (ref 3.5–5.1)
SGOT(AST): 16 U/L (ref 15–37)
SGPT (ALT): 12 U/L (ref 12–78)
Sodium: 144 mmol/L (ref 136–145)
Total Protein: 4.5 g/dL — ABNORMAL LOW (ref 6.4–8.2)

## 2013-07-22 LAB — CBC WITH DIFFERENTIAL/PLATELET
Basophil #: 0 10*3/uL (ref 0.0–0.1)
Basophil %: 0.6 %
Eosinophil #: 0.1 10*3/uL (ref 0.0–0.7)
Eosinophil %: 2 %
Lymphocyte %: 33.6 %
MCH: 32.3 pg (ref 26.0–34.0)
MCHC: 34.1 g/dL (ref 32.0–36.0)
MCV: 95 fL (ref 80–100)
Monocyte #: 0.6 x10 3/mm (ref 0.2–1.0)
Neutrophil #: 4.1 10*3/uL (ref 1.4–6.5)
RBC: 2.66 10*6/uL — ABNORMAL LOW (ref 4.40–5.90)
RDW: 14.3 % (ref 11.5–14.5)

## 2013-07-22 LAB — HEMOGLOBIN A1C: Hemoglobin A1C: 5.5 % (ref 4.2–6.3)

## 2013-07-23 LAB — BASIC METABOLIC PANEL
Anion Gap: 4 — ABNORMAL LOW (ref 7–16)
BUN: 10 mg/dL (ref 7–18)
Calcium, Total: 8.7 mg/dL (ref 8.5–10.1)
Chloride: 110 mmol/L — ABNORMAL HIGH (ref 98–107)
Co2: 27 mmol/L (ref 21–32)
Creatinine: 1 mg/dL (ref 0.60–1.30)
EGFR (African American): >60
EGFR (Non-African Amer.): >60
Glucose: 105 mg/dL — ABNORMAL HIGH (ref 65–99)
Potassium: 4.4 mmol/L (ref 3.5–5.1)

## 2013-07-23 LAB — CBC WITH DIFFERENTIAL/PLATELET
Basophil %: 0.5 %
Eosinophil #: 0.3 10*3/uL (ref 0.0–0.7)
HCT: 31.1 % — ABNORMAL LOW (ref 40.0–52.0)
HGB: 10.4 g/dL — ABNORMAL LOW (ref 13.0–18.0)
Lymphocyte #: 1.6 10*3/uL (ref 1.0–3.6)
Lymphocyte %: 17.4 %
MCHC: 33.5 g/dL (ref 32.0–36.0)
MCV: 96 fL (ref 80–100)
Monocyte #: 0.7 x10 3/mm (ref 0.2–1.0)
Neutrophil %: 70.7 %
Platelet: 93 10*3/uL — ABNORMAL LOW (ref 150–440)
WBC: 9.1 10*3/uL (ref 3.8–10.6)

## 2013-07-23 LAB — URINALYSIS, COMPLETE
Bacteria: NONE SEEN
Bilirubin,UR: NEGATIVE
Blood: NEGATIVE
Glucose,UR: NEGATIVE mg/dL (ref 0–75)
Leukocyte Esterase: NEGATIVE
Protein: NEGATIVE
RBC,UR: NONE SEEN /HPF (ref 0–5)
Specific Gravity: 1.011 (ref 1.003–1.030)
Squamous Epithelial: NONE SEEN

## 2013-07-24 LAB — CBC WITH DIFFERENTIAL/PLATELET
Basophil #: 0.1 10*3/uL (ref 0.0–0.1)
Basophil %: 0.8 %
Eosinophil %: 4.5 %
HGB: 10.8 g/dL — ABNORMAL LOW (ref 13.0–18.0)
Lymphocyte #: 2.2 10*3/uL (ref 1.0–3.6)
MCH: 32.7 pg (ref 26.0–34.0)
MCV: 95 fL (ref 80–100)
Monocyte %: 8.8 %
Neutrophil %: 54.8 %
Platelet: 104 10*3/uL — ABNORMAL LOW (ref 150–440)
WBC: 7.1 10*3/uL (ref 3.8–10.6)

## 2013-07-24 LAB — BASIC METABOLIC PANEL
Anion Gap: 1 — ABNORMAL LOW (ref 7–16)
Calcium, Total: 9 mg/dL (ref 8.5–10.1)
Co2: 31 mmol/L (ref 21–32)
Creatinine: 0.94 mg/dL (ref 0.60–1.30)
EGFR (African American): >60
EGFR (Non-African Amer.): >60
Glucose: 105 mg/dL — ABNORMAL HIGH (ref 65–99)
Osmolality: 276 (ref 275–301)
Potassium: 4.3 mmol/L (ref 3.5–5.1)
Sodium: 139 mmol/L (ref 136–145)

## 2013-07-25 LAB — CBC WITH DIFFERENTIAL/PLATELET
Basophil %: 0.7 %
Eosinophil #: 0.3 10*3/uL (ref 0.0–0.7)
Eosinophil %: 3.6 %
HCT: 32.6 % — ABNORMAL LOW (ref 40.0–52.0)
Lymphocyte #: 1.5 10*3/uL (ref 1.0–3.6)
Lymphocyte %: 20.8 %
MCH: 33 pg (ref 26.0–34.0)
MCHC: 34.7 g/dL (ref 32.0–36.0)
Monocyte %: 9.3 %
Neutrophil %: 65.6 %
Platelet: 138 10*3/uL — ABNORMAL LOW (ref 150–440)
WBC: 7 10*3/uL (ref 3.8–10.6)

## 2013-07-31 ENCOUNTER — Telehealth: Payer: Self-pay | Admitting: Internal Medicine

## 2013-07-31 NOTE — Telephone Encounter (Signed)
The patient is needing prior authorization for Dexilant 40 mg or a prescription for Protonix  40 mg 2 times a day.

## 2013-07-31 NOTE — Telephone Encounter (Signed)
Justin Patel, could you please check on this?

## 2013-07-31 NOTE — Telephone Encounter (Signed)
error 

## 2013-07-31 NOTE — Telephone Encounter (Signed)
Waiting for the PA request form to fax over

## 2013-07-31 NOTE — Telephone Encounter (Signed)
Did we already send this?

## 2013-08-06 NOTE — Telephone Encounter (Signed)
Called Pharmacy Protonix 40 mg  Is ready for pick up at the pharmacy

## 2013-08-20 ENCOUNTER — Ambulatory Visit (INDEPENDENT_AMBULATORY_CARE_PROVIDER_SITE_OTHER): Payer: BC Managed Care – PPO | Admitting: Internal Medicine

## 2013-08-20 ENCOUNTER — Encounter: Payer: Self-pay | Admitting: Internal Medicine

## 2013-08-20 VITALS — BP 128/88 | HR 87 | Temp 98.5°F | Wt 192.0 lb

## 2013-08-20 DIAGNOSIS — R1032 Left lower quadrant pain: Secondary | ICD-10-CM | POA: Insufficient documentation

## 2013-08-20 DIAGNOSIS — N179 Acute kidney failure, unspecified: Secondary | ICD-10-CM

## 2013-08-20 DIAGNOSIS — K922 Gastrointestinal hemorrhage, unspecified: Secondary | ICD-10-CM

## 2013-08-20 LAB — CBC WITH DIFFERENTIAL/PLATELET
Basophils Absolute: 0 10*3/uL (ref 0.0–0.1)
Basophils Relative: 0.3 % (ref 0.0–3.0)
Eosinophils Absolute: 0 10*3/uL (ref 0.0–0.7)
Eosinophils Relative: 0.3 % (ref 0.0–5.0)
HEMATOCRIT: 40.3 % (ref 39.0–52.0)
HEMOGLOBIN: 13.5 g/dL (ref 13.0–17.0)
LYMPHS ABS: 2 10*3/uL (ref 0.7–4.0)
LYMPHS PCT: 19.5 % (ref 12.0–46.0)
MCHC: 33.5 g/dL (ref 30.0–36.0)
MCV: 96.9 fl (ref 78.0–100.0)
MONOS PCT: 9.2 % (ref 3.0–12.0)
Monocytes Absolute: 0.9 10*3/uL (ref 0.1–1.0)
NEUTROS ABS: 7.1 10*3/uL (ref 1.4–7.7)
Neutrophils Relative %: 70.7 % (ref 43.0–77.0)
Platelets: 224 10*3/uL (ref 150.0–400.0)
RBC: 4.16 Mil/uL — ABNORMAL LOW (ref 4.22–5.81)
RDW: 14.5 % (ref 11.5–14.6)
WBC: 10.1 10*3/uL (ref 4.5–10.5)

## 2013-08-20 LAB — COMPREHENSIVE METABOLIC PANEL
ALT: 21 U/L (ref 0–53)
AST: 23 U/L (ref 0–37)
Albumin: 4.1 g/dL (ref 3.5–5.2)
Alkaline Phosphatase: 65 U/L (ref 39–117)
BUN: 19 mg/dL (ref 6–23)
CHLORIDE: 105 meq/L (ref 96–112)
CO2: 25 meq/L (ref 19–32)
CREATININE: 1.4 mg/dL (ref 0.4–1.5)
Calcium: 9.8 mg/dL (ref 8.4–10.5)
GFR: 56.82 mL/min — AB (ref >60.00)
GLUCOSE: 154 mg/dL — AB (ref 70–99)
Potassium: 3.6 mEq/L (ref 3.5–5.1)
Sodium: 141 mEq/L (ref 135–145)
Total Bilirubin: 0.5 mg/dL (ref 0.3–1.2)
Total Protein: 7 g/dL (ref 6.0–8.3)

## 2013-08-20 LAB — FERRITIN: FERRITIN: 86.9 ng/mL (ref 22.0–322.0)

## 2013-08-20 MED ORDER — PANTOPRAZOLE SODIUM 40 MG PO TBEC: 40.0000 mg | DELAYED_RELEASE_TABLET | Freq: Two times a day (BID) | ORAL | Status: DC

## 2013-08-20 NOTE — Assessment & Plan Note (Signed)
During recent hospitalization for GI bleed. Likely secondary to prerenal azotemia. Will recheck renal function with labs today.

## 2013-08-20 NOTE — Assessment & Plan Note (Signed)
Recent onset LLQ abdominal pain. Question diverticulitis. We discussed starting antibiotics, but will hold off for now, given recent GI bleeding, will get CT abdomen for further evaluation first.

## 2013-08-20 NOTE — Assessment & Plan Note (Addendum)
Recent hospitalization for upper GI bleeding secondary to gastric ulcer with life-threatening hemorrhage, hypotension, acute renal failure. No recurrent symptoms of black or bloody stools. No epigastric pain. Following bland diet. Pt insurance will not cover Dexilant or Pantoprazole. Samples given of Dexilant and Nexium today. Will try to get prior auth for bid Pantoprazole. Will check CBC, ferritin today. Follow up with GI as scheduled.  Over 43min of which >50% spent in face-to-face contact with patient discussing plan of care

## 2013-08-20 NOTE — Progress Notes (Signed)
Pre-visit discussion using our clinic review tool. No additional management support is needed unless otherwise documented below in the visit note.  

## 2013-08-20 NOTE — Progress Notes (Signed)
Subjective:    Patient ID: Justin Patel, male    DOB: [birth year], 63 y.o.   MRN: [id number]  HPI 63YO male with h/o DM, obesity s/p gastric bypass, presents for hospital follow up after GI bleed. Dec 13th, woke up, tried to walk to bathroom, felt lightheaded, passed out, landed in garden tub. Wife entered room, noted pt to be confused, pt tried to stand and fell again, became unconscious, gasping for breath. Wife called 911. Wife noted generalized shaking. Pt then had "massive" black tarry stool, then bright red blood. Paramedics took BP 50/38. Syncope again on transport to ED. Transfused 1 unit PRBC. Upper endoscopy by Dr. Candace Cruise, showed gastric ulcer with hemorrhage, cauterized. Developed renal failure during hospitalization. Concluded related to dehydration. Found bilateral renal cysts on [place]. Notably, pt was off his Dexilant for 1-2 months prior to recurrent GI bleeding because of insurance denial of coverage. Since discharged, has been feeling fatigued. No recurrent black or bloody stool. No nausea, vomiting. Following a bland diet. Taking omeprazole twice daily.   Over the last few days, he has noted some left lower quadrant abdominal pain. This is described as mild. Not associated with food intake. No diarrhea,nausea, vomiting, fever, chills. No black or bloody stool. Not taking anything for pain.  Outpatient Encounter Prescriptions as of 08/20/2013  Medication Sig  . DULoxetine (CYMBALTA) 60 MG capsule Take 1 capsule (60 mg total) by mouth daily.  . furosemide (LASIX) 20 MG tablet Take 1 tablet (20 mg total) by mouth daily.  . hydrALAZINE (APRESOLINE) 50 MG tablet Take 100 mg by mouth 4 (four) times daily.  . Insulin Detemir (LEVEMIR FLEXTOUCH) 100 UNIT/ML SOPN INJECT 26 UNITS SUBCUTANEOUSLY AT BEDTIME OR AS DIRECTED  . losartan (COZAAR) 100 MG tablet take 1 tablet by mouth once daily  . metoprolol (TOPROL-XL) 200 MG 24 hr tablet Take 1 tablet (200 mg total) by mouth daily.  . mupirocin  ointment (BACTROBAN) 2 % Place 1 application into the nose 2 (two) times daily.  . rosuvastatin (CRESTOR) 10 MG tablet Take 1 tablet (10 mg total) by mouth daily.  . sucralfate (CARAFATE) 1 G tablet Take 1 g by mouth 4 (four) times daily.   . traMADol (ULTRAM) 50 MG tablet Take 50 mg by mouth every 6 (six) hours as needed.  Marland Kitchen HYDROcodone-acetaminophen (NORCO) 7.5-325 MG per tablet Take 1 tablet by mouth every 8 (eight) hours as needed for moderate pain.  . pantoprazole (PROTONIX) 40 MG tablet Take 1 tablet (40 mg total) by mouth 2 (two) times daily.    BP 128/88  Pulse 87  Temp(Src) 98.5 F (36.9 C) (Oral)  Wt 192 lb (87.091 kg)  SpO2 97%  Review of Systems  Constitutional: Positive for fatigue. Negative for fever, chills, activity change, appetite change and unexpected weight change.  Eyes: Negative for visual disturbance.  Respiratory: Negative for cough and shortness of breath.   Cardiovascular: Negative for chest pain, palpitations and leg swelling.  Gastrointestinal: Positive for abdominal pain and blood in stool. Negative for nausea, vomiting, diarrhea, constipation, abdominal distention and rectal pain.  Genitourinary: Negative for dysuria, urgency and difficulty urinating.  Musculoskeletal: Negative for arthralgias and gait problem.  Skin: Negative for color change and rash.  Neurological: Positive for syncope.  Hematological: Negative for adenopathy.  Psychiatric/Behavioral: Negative for sleep disturbance and dysphoric mood. The patient is not nervous/anxious.        Objective:   Physical Exam  Constitutional: He is oriented to person, place, and  time. He appears well-developed and well-nourished. No distress.  HENT:  Head: Normocephalic and atraumatic.  Right Ear: External ear normal.  Left Ear: External ear normal.  Nose: Nose normal.  Mouth/Throat: Oropharynx is clear and moist. No oropharyngeal exudate.  Eyes: Conjunctivae and EOM are normal. Pupils are equal,  round, and reactive to light. Right eye exhibits no discharge. Left eye exhibits no discharge. No scleral icterus.  Neck: Normal range of motion. Neck supple. No tracheal deviation present. No thyromegaly present.  Cardiovascular: Normal rate, regular rhythm and normal heart sounds.  Exam reveals no gallop and no friction rub.   No murmur heard. Pulmonary/Chest: Effort normal and breath sounds normal. No respiratory distress. He has no wheezes. He has no rales. He exhibits no tenderness.  Abdominal: Soft. Bowel sounds are normal. He exhibits no distension and no mass. There is tenderness (mild LLQ). There is no rebound and no guarding.  Musculoskeletal: Normal range of motion. He exhibits no edema.  Lymphadenopathy:    He has no cervical adenopathy.  Neurological: He is alert and oriented to person, place, and time. No cranial nerve deficit. Coordination normal.  Skin: Skin is warm and dry. No rash noted. He is not diaphoretic. No erythema. No pallor.  Psychiatric: He has a normal mood and affect. His behavior is normal. Judgment and thought content normal.          Assessment & Plan:

## 2013-08-21 ENCOUNTER — Telehealth: Payer: Self-pay | Admitting: *Deleted

## 2013-08-21 ENCOUNTER — Ambulatory Visit: Payer: Self-pay | Admitting: Internal Medicine

## 2013-08-21 NOTE — Telephone Encounter (Signed)
Received PA request form for the Pantoprazole 40 mg, placed in Dr.Walkers folder

## 2013-08-21 NOTE — Telephone Encounter (Signed)
Patient informed of CT results and PA has been completed and faxed.

## 2013-08-21 NOTE — Telephone Encounter (Signed)
CT abdomen showed diverticulosis but no evidence of acute diverticulitis. I would recommend follow up with GI as scheduled, unless left lower abdominal pain is persistent or severe.

## 2013-08-22 NOTE — Telephone Encounter (Signed)
Received PA request form, placed in Dr.walkers folder

## 2013-08-24 ENCOUNTER — Encounter: Payer: Self-pay | Admitting: Internal Medicine

## 2013-08-27 ENCOUNTER — Ambulatory Visit (INDEPENDENT_AMBULATORY_CARE_PROVIDER_SITE_OTHER): Payer: BC Managed Care – PPO | Admitting: Internal Medicine

## 2013-08-27 ENCOUNTER — Encounter: Payer: Self-pay | Admitting: Internal Medicine

## 2013-08-27 ENCOUNTER — Ambulatory Visit: Payer: Self-pay | Admitting: Internal Medicine

## 2013-08-27 ENCOUNTER — Encounter (INDEPENDENT_AMBULATORY_CARE_PROVIDER_SITE_OTHER): Payer: Self-pay

## 2013-08-27 VITALS — BP 132/84 | HR 70 | Temp 98.2°F | Wt 194.0 lb

## 2013-08-27 DIAGNOSIS — H6690 Otitis media, unspecified, unspecified ear: Secondary | ICD-10-CM

## 2013-08-27 DIAGNOSIS — H6691 Otitis media, unspecified, right ear: Secondary | ICD-10-CM | POA: Insufficient documentation

## 2013-08-27 DIAGNOSIS — Q619 Cystic kidney disease, unspecified: Secondary | ICD-10-CM

## 2013-08-27 DIAGNOSIS — N179 Acute kidney failure, unspecified: Secondary | ICD-10-CM

## 2013-08-27 DIAGNOSIS — K922 Gastrointestinal hemorrhage, unspecified: Secondary | ICD-10-CM

## 2013-08-27 DIAGNOSIS — N281 Cyst of kidney, acquired: Secondary | ICD-10-CM | POA: Insufficient documentation

## 2013-08-27 DIAGNOSIS — R3911 Hesitancy of micturition: Secondary | ICD-10-CM | POA: Insufficient documentation

## 2013-08-27 DIAGNOSIS — R1032 Left lower quadrant pain: Secondary | ICD-10-CM

## 2013-08-27 LAB — IRON AND TIBC
Iron Bind.Cap.(Total): 360 ug/dL (ref 250–450)
Iron Saturation: 19 %
Iron: 68 ug/dL (ref 65–175)
Unbound Iron-Bind.Cap.: 292 ug/dL

## 2013-08-27 LAB — CBC CANCER CENTER
Basophil #: 0.1 x10 3/mm (ref 0.0–0.1)
Basophil %: 0.8 %
EOS ABS: 0.2 x10 3/mm (ref 0.0–0.7)
EOS PCT: 2 %
HCT: 39.2 % — ABNORMAL LOW (ref 40.0–52.0)
HGB: 12.9 g/dL — ABNORMAL LOW (ref 13.0–18.0)
LYMPHS ABS: 3.1 x10 3/mm (ref 1.0–3.6)
LYMPHS PCT: 32.3 %
MCH: 32.2 pg (ref 26.0–34.0)
MCHC: 33 g/dL (ref 32.0–36.0)
MCV: 98 fL (ref 80–100)
MONO ABS: 0.6 x10 3/mm (ref 0.2–1.0)
Monocyte %: 6.6 %
NEUTROS ABS: 5.6 x10 3/mm (ref 1.4–6.5)
Neutrophil %: 58.3 %
Platelet: 272 x10 3/mm (ref 150–440)
RBC: 4.01 10*6/uL — AB (ref 4.40–5.90)
RDW: 13.8 % (ref 11.5–14.5)
WBC: 9.6 x10 3/mm (ref 3.8–10.6)

## 2013-08-27 LAB — POCT URINALYSIS DIPSTICK
Bilirubin, UA: NEGATIVE
Blood, UA: NEGATIVE
Glucose, UA: NEGATIVE
KETONES UA: NEGATIVE
Leukocytes, UA: NEGATIVE
Nitrite, UA: NEGATIVE
PH UA: 5
Protein, UA: NEGATIVE
Spec Grav, UA: 1.015
Urobilinogen, UA: 0.2

## 2013-08-27 LAB — BASIC METABOLIC PANEL
BUN: 17 mg/dL (ref 6–23)
CHLORIDE: 105 meq/L (ref 96–112)
CO2: 27 mEq/L (ref 19–32)
Calcium: 9.1 mg/dL (ref 8.4–10.5)
Creatinine, Ser: 1.1 mg/dL (ref 0.4–1.5)
GFR: 73.5 mL/min (ref >60.00)
Glucose, Bld: 97 mg/dL (ref 70–99)
POTASSIUM: 4.3 meq/L (ref 3.5–5.1)
Sodium: 138 mEq/L (ref 135–145)

## 2013-08-27 LAB — FERRITIN: FERRITIN (ARMC): 62 ng/mL (ref 8–388)

## 2013-08-27 MED ORDER — AMOXICILLIN-POT CLAVULANATE 500-125 MG PO TABS: 1.0000 | ORAL_TABLET | Freq: Three times a day (TID) | ORAL | Status: DC

## 2013-08-27 NOTE — Assessment & Plan Note (Signed)
Renal function improved on labs today. Recent ARF likely secondary to prerenal azotemia in setting of GI bleed.

## 2013-08-27 NOTE — Assessment & Plan Note (Signed)
Persistent LLQ abdominal pain. Recent CT abdomen showed no acute findings to explain pain. Question if left renal cyst may be contributing. Will set up urology evaluation.

## 2013-08-27 NOTE — Assessment & Plan Note (Signed)
No recurrent bleeding. Hgb stable on recent labs. Prior authorization completed for pantoprazole with pt insurance. Will follow up with GI as scheduled next week.

## 2013-08-27 NOTE — Assessment & Plan Note (Signed)
Recent symptoms of worsening urinary hesitancy. History of enlarged prostate, followed by urology. Will set up repeat urology evaluation.

## 2013-08-27 NOTE — Assessment & Plan Note (Signed)
Exam is consistent with right otitis media and likely right maxillary sinusitis. Will start Augmentin. If no improvement, plan for ENT referral.

## 2013-08-27 NOTE — Assessment & Plan Note (Signed)
Bilateral renal cysts noted on recent ultrasound. Will set up followup both with urology and nephrology. Question if large left renal cysts may be contributing to left abdominal pain.

## 2013-08-27 NOTE — Progress Notes (Signed)
Subjective:    Patient ID: Justin Patel, male    DOB: 1951-03-15, 63 y.o.   MRN: [id number]  HPI 63 year old male with history of recent life-threatening GI bleeding, gastric bypass, diabetes, hypertension, hyperlipidemia presents for followup. He reports persistent fatigue. He denies any recurrent blood in his stool, black stool. He does continue to have left mid abdominal pain. Recent CT scan performed last week showed no acute findings to explain abdominal pain. He has been noted to have bilateral renal cysts. He has not recently followed up with urology. Over the last few weeks, after his hospitalization, he notes decreased urine output and hesitancy.  Outpatient Encounter Prescriptions as of 08/27/2013  Medication Sig  . DULoxetine (CYMBALTA) 60 MG capsule Take 1 capsule (60 mg total) by mouth daily.  . furosemide (LASIX) 20 MG tablet Take 1 tablet (20 mg total) by mouth daily.  . hydrALAZINE (APRESOLINE) 50 MG tablet Take 100 mg by mouth 4 (four) times daily.  . Insulin Detemir (LEVEMIR FLEXTOUCH) 100 UNIT/ML SOPN INJECT 26 UNITS SUBCUTANEOUSLY AT BEDTIME OR AS DIRECTED  . losartan (COZAAR) 100 MG tablet take 1 tablet by mouth once daily  . metoprolol (TOPROL-XL) 200 MG 24 hr tablet Take 1 tablet (200 mg total) by mouth daily.  . mupirocin ointment (BACTROBAN) 2 % Place 1 application into the nose 2 (two) times daily.  . pantoprazole (PROTONIX) 40 MG tablet Take 1 tablet (40 mg total) by mouth 2 (two) times daily.  . rosuvastatin (CRESTOR) 10 MG tablet Take 1 tablet (10 mg total) by mouth daily.  . sucralfate (CARAFATE) 1 G tablet Take 1 g by mouth 4 (four) times daily.    BP 132/84  Pulse 70  Temp(Src) 98.2 F (36.8 C) (Oral)  Wt 194 lb (87.998 kg)  SpO2 99%  Review of Systems  Constitutional: Positive for fatigue. Negative for fever, chills, activity change, appetite change and unexpected weight change.  Eyes: Negative for visual disturbance.  Respiratory: Negative for cough  and shortness of breath.   Cardiovascular: Negative for chest pain, palpitations and leg swelling.  Gastrointestinal: Positive for abdominal pain. Negative for vomiting, diarrhea, constipation, blood in stool, abdominal distention, anal bleeding and rectal pain.  Genitourinary: Positive for decreased urine volume. Negative for dysuria, urgency, frequency, hematuria and difficulty urinating.  Musculoskeletal: Negative for arthralgias and gait problem.  Skin: Negative for color change and rash.  Hematological: Negative for adenopathy.  Psychiatric/Behavioral: Negative for sleep disturbance and dysphoric mood. The patient is not nervous/anxious.        Objective:   Physical Exam  Constitutional: He is oriented to person, place, and time. He appears well-developed and well-nourished. No distress.  HENT:  Head: Normocephalic and atraumatic.  Right Ear: External ear normal.  Left Ear: External ear normal.  Nose: Nose normal.  Mouth/Throat: Oropharynx is clear and moist. No oropharyngeal exudate.  Eyes: Conjunctivae and EOM are normal. Pupils are equal, round, and reactive to light. Right eye exhibits no discharge. Left eye exhibits no discharge. No scleral icterus.  Neck: Normal range of motion. Neck supple. No tracheal deviation present. No thyromegaly present.  Cardiovascular: Normal rate, regular rhythm and normal heart sounds.  Exam reveals no gallop and no friction rub.   No murmur heard. Pulmonary/Chest: Effort normal and breath sounds normal. No respiratory distress. He has no wheezes. He has no rales. He exhibits no tenderness.  Abdominal: Soft. Bowel sounds are normal. He exhibits no distension.  Musculoskeletal: Normal range of motion. He exhibits  no edema.  Lymphadenopathy:    He has no cervical adenopathy.  Neurological: He is alert and oriented to person, place, and time. No cranial nerve deficit. Coordination normal.  Skin: Skin is warm and dry. No rash noted. He is not  diaphoretic. No erythema. No pallor.  Psychiatric: He has a normal mood and affect. His behavior is normal. Judgment and thought content normal.          Assessment & Plan:

## 2013-08-27 NOTE — Progress Notes (Signed)
Pre-visit discussion using our clinic review tool. No additional management support is needed unless otherwise documented below in the visit note.  

## 2013-09-02 ENCOUNTER — Other Ambulatory Visit: Payer: Self-pay | Admitting: Internal Medicine

## 2013-09-09 ENCOUNTER — Ambulatory Visit: Payer: Self-pay | Admitting: Internal Medicine

## 2013-09-10 ENCOUNTER — Encounter: Payer: Self-pay | Admitting: Internal Medicine

## 2013-09-10 ENCOUNTER — Other Ambulatory Visit: Payer: Self-pay | Admitting: Internal Medicine

## 2013-09-17 LAB — FERRITIN: Ferritin (ARMC): 38 ng/mL (ref 8–388)

## 2013-09-17 LAB — CANCER CENTER HEMOGLOBIN: HGB: 13.6 g/dL (ref 13.0–18.0)

## 2013-09-29 ENCOUNTER — Other Ambulatory Visit: Payer: Self-pay | Admitting: Internal Medicine

## 2013-10-04 ENCOUNTER — Ambulatory Visit: Payer: BC Managed Care – PPO | Admitting: Internal Medicine

## 2013-10-05 DIAGNOSIS — R39198 Other difficulties with micturition: Secondary | ICD-10-CM | POA: Insufficient documentation

## 2013-10-05 DIAGNOSIS — Z87442 Personal history of urinary calculi: Secondary | ICD-10-CM | POA: Insufficient documentation

## 2013-10-07 ENCOUNTER — Ambulatory Visit: Payer: Self-pay | Admitting: Internal Medicine

## 2013-10-09 ENCOUNTER — Telehealth: Payer: Self-pay | Admitting: Internal Medicine

## 2013-10-09 ENCOUNTER — Telehealth: Payer: Self-pay | Admitting: *Deleted

## 2013-10-09 ENCOUNTER — Ambulatory Visit (INDEPENDENT_AMBULATORY_CARE_PROVIDER_SITE_OTHER)
Admission: RE | Admit: 2013-10-09 | Discharge: 2013-10-09 | Disposition: A | Discharge status: Home / Self Care | Payer: BC Managed Care – PPO | Source: Ambulatory Visit | Attending: Internal Medicine | Admitting: Internal Medicine

## 2013-10-09 ENCOUNTER — Ambulatory Visit (INDEPENDENT_AMBULATORY_CARE_PROVIDER_SITE_OTHER): Payer: BC Managed Care – PPO | Admitting: Internal Medicine

## 2013-10-09 ENCOUNTER — Encounter: Payer: Self-pay | Admitting: Internal Medicine

## 2013-10-09 VITALS — BP 102/68 | HR 76 | Temp 98.1°F | Resp 18 | Wt 194.0 lb

## 2013-10-09 DIAGNOSIS — S92309A Fracture of unspecified metatarsal bone(s), unspecified foot, initial encounter for closed fracture: Secondary | ICD-10-CM

## 2013-10-09 DIAGNOSIS — Z13820 Encounter for screening for osteoporosis: Secondary | ICD-10-CM

## 2013-10-09 DIAGNOSIS — M25579 Pain in unspecified ankle and joints of unspecified foot: Secondary | ICD-10-CM

## 2013-10-09 DIAGNOSIS — R296 Repeated falls: Secondary | ICD-10-CM

## 2013-10-09 DIAGNOSIS — E559 Vitamin D deficiency, unspecified: Secondary | ICD-10-CM

## 2013-10-09 DIAGNOSIS — S92301A Fracture of unspecified metatarsal bone(s), right foot, initial encounter for closed fracture: Secondary | ICD-10-CM

## 2013-10-09 DIAGNOSIS — Z9181 History of falling: Secondary | ICD-10-CM

## 2013-10-09 DIAGNOSIS — Z9884 Bariatric surgery status: Secondary | ICD-10-CM

## 2013-10-09 MED ORDER — HYDROCODONE-ACETAMINOPHEN 10-325 MG PO TABS: 1.0000 | ORAL_TABLET | Freq: Three times a day (TID) | ORAL | Status: DC | PRN

## 2013-10-09 NOTE — Progress Notes (Signed)
Pre-visit discussion using our clinic review tool. No additional management support is needed unless otherwise documented below in the visit note.  

## 2013-10-09 NOTE — Telephone Encounter (Signed)
Closed in error.

## 2013-10-09 NOTE — Telephone Encounter (Signed)
Patient was put on Dr. Lupita Dawn schedule and was seen today by her.

## 2013-10-09 NOTE — Telephone Encounter (Signed)
Can he be seen at [facility] today? We have no openings here and he will likely need imaging which can be performed at [facility]?

## 2013-10-09 NOTE — Telephone Encounter (Signed)
Please advise 

## 2013-10-09 NOTE — Telephone Encounter (Signed)
Patient called for X-ray result stated he was given a disc.Staed he prefers [facility] Dr. Sabra Heck any day but tomorrow, Please advise

## 2013-10-09 NOTE — Telephone Encounter (Signed)
Patient Information:  Caller Name: [name]  Phone: [phone]  Patient: Justin Patel  Gender: Male  DOB: 10/16/1950  Age: 63 Years  PCP: Ronette Deter (Adults only)  Office Follow Up:  Does the office need to follow up with this patient?: Yes  Instructions For The Office: Plan of care for possible foot fracture  RN Note:  Patient calling regarding fall in home on 10/08/13.  Injury to right foot.  Unable to bear weight.  Prior hx of 2 closed fractures on that foot.  Symptoms  Reason For Call & Symptoms: foot injury  Reviewed Health History In EMR: Yes  Reviewed Medications In EMR: Yes  Reviewed Allergies In EMR: Yes  Reviewed Surgeries / Procedures: Yes  Date of Onset of Symptoms: 10/08/2013  Guideline(s) Used:  Ankle and Foot Injury  Disposition Per Guideline:   Go to ED Now (or to Office with PCP Approval)  Reason For Disposition Reached:   Can't stand (bear weight) or walk (e.g., 4 steps)  Advice Given:  N/A  RN Overrode Recommendation:  Follow Up With Office Later  Office to follow up possible broken foot (right)

## 2013-10-09 NOTE — Progress Notes (Signed)
Patient ID: Justin Patel, male   DOB: 1951-01-13, 63 y.o.   MRN: [id number] Patient Active Problem List   Diagnosis Date Noted  . Hypovitaminosis D 10/11/2013  . Fracture of metatarsal bone of right foot 10/11/2013  . Pain in joint, ankle and foot 10/09/2013  . Urinary hesitancy 08/27/2013  . Renal cyst 08/27/2013  . Right otitis media 08/27/2013  . Upper GI bleed 08/20/2013  . Acute renal failure 08/20/2013  . Abdominal pain, left lower quadrant 08/20/2013  . Type II or unspecified type diabetes mellitus without mention of complication, not stated as uncontrolled 03/21/2013  . Chronic low back pain 03/21/2013  . Essential hypertension, benign 03/21/2013  . Other and unspecified hyperlipidemia 03/21/2013  . Obesity, unspecified 03/21/2013    Subjective:  CC:   Chief Complaint  Patient presents with  . Foot Pain    Right foot pain/ fell yesterday foot bluish colr noted at right small toe.    HPI:   Justin Patel is a 63 y.o. male who presents for  Right foot pain and discoloration after a fall after standing up too quickly. He was recently prescribed flomax by his urologist. , and since he started the medication he has been having episodes of positional orthostasis.  Has been icing th foot and elevating it.   Cannot take advil or motrin due to history of gastric ulcer with GI bleed after his bypass surgery requiring transfusion.   Taking tylenol.  Tolerates everything  History of other fractures:  Prior 5th metatarsal fracture remotely., before gastric bypass surgery.  History of vertebral fracutre occurred after gastric bvypass.  History of shoulder /humeral fracture occurred 20 yrs ago, before bypass.      Past Medical History  Diagnosis Date  . Hypertension   . Anxiety   . Sleep apnea     hx not now since wt loss  . Diabetes mellitus   . Stones in the urinary tract   . GERD (gastroesophageal reflux disease)   . H/O hiatal hernia   . Headache(784.0)   . Arthritis    . 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 Gastric ulcer   . Degenerative disc disease   . Sacral fracture, closed     Past Surgical History  Procedure Laterality Date  . Cardiac catheterization      [facility], normal  . Hernia repair  2000    hiatal  . Gastric bypass  2010    [facility]  . Knee arthroscopy      bilateral, left x 2  . Osteotomy  2001    left  . Tonsillectomy    . Appendectomy    . Nasal sinus surgery      x5  . Uvulopalatoplasty  2011  . Anterior cervical decomp/discectomy fusion  01/26/2012    Procedure: ANTERIOR CERVICAL DECOMPRESSION/DISCECTOMY FUSION 3 LEVELS;  Surgeon: Floyce Stakes, MD;  Location: [facility];  Service: Neurosurgery;  Laterality: N/A;  Cervical three-four Cervical four-five Cervical five-six Cervical six-seven , Anterior cervical decompression/diskectomy, fusion, plate       The following portions of the patient's history were reviewed and updated as appropriate: Allergies, current medications, and problem list.    Review of Systems:   Patient denies headache, fevers, malaise, unintentional weight loss, skin rash, eye pain, sinus congestion and sinus pain, sore throat, dysphagia,  hemoptysis , cough, dyspnea, wheezing, chest pain, palpitations, orthopnea, edema, abdominal pain, nausea, melena, diarrhea, constipation, flank  pain, dysuria, hematuria, urinary  Frequency, nocturia, numbness, tingling, seizures,  Focal weakness, Loss of consciousness,  Tremor, insomnia, depression, anxiety, and suicidal ideation.     History   Social History  . Marital Status: Married    Spouse Name: N/A    Number of Children: N/A  . Years of Education: N/A   Occupational History  . Not on file.   Social History Main Topics  . Smoking status: Never Smoker   . Smokeless tobacco: Not on file  . Alcohol Use: 0.6 oz/week    1 Glasses of wine per week     Comment: occasional  . Drug Use: No  . Sexual Activity: Not on  file   Other Topics Concern  . Not on file   Social History Narrative   Lives in [place] with wife, [place]. 2 sons, William Hamburger, Lennette Bihari 42.. 4 grandchildren      Work - Retired, previously taught music in [place] - regular diet, limited quantities after gastric bypass      Exercise - no regular, limited by arthritis in knees, occasional water aerobics    Objective:  Filed Vitals:   10/09/13 1412  BP: 102/68  Pulse: 76  Temp: 98.1 F (36.7 C)  Resp: 18     General appearance: alert, cooperative and appears stated age Ears: normal TM's and external ear canals both ears Throat: lips, mucosa, and tongue normal; teeth and gums normal Neck: no adenopathy, no carotid bruit, supple, symmetrical, trachea midline and thyroid not enlarged, symmetric, no tenderness/mass/nodules Back: symmetric, no curvature. ROM normal. No CVA tenderness. Lungs: clear to auscultation bilaterally Heart: regular rate and rhythm, S1, S2 normal, no murmur, click, rub or gallop Abdomen: soft, non-tender; bowel sounds normal; no masses,  no organomegaly Pulses: 2+ and symmetric Skin: Skin color, texture, turgor normal. No rashes or lesions Lymph nodes: Cervical, supraclavicular, and axillary nodes normal. Ext: right foot with small ecchymosis and swelling on dorsolateral side, exquisite pain over MTs 3 to 5   Assessment and Plan:  Pain in joint, ankle and foot Given history of gastric bypass , will rule out fracture.  Stop flomax  vicodin   Hypovitaminosis D Mild but in the setting of MT fractures, will replace, with Drisdol weekly x 1 month    Fracture of metatarsal bone of right foot 3, 4 and 5,  Oblique,  Nondisplaced.  Secondary to fall .  Ortho referral pending.  Vit D replacement in progress .  DEXA scan ordered given history of gastric bypass and low Vit D to rule out osteoporosis iatrogenic . vicodin for pain .  A total of 40 minutes was spent with patient more  than half of which was spent in counseling, reviewing records from other prviders and coordination of care.   Updated Medication List Outpatient Encounter Prescriptions as of 10/09/2013  Medication Sig  . DULoxetine (CYMBALTA) 60 MG capsule take 1 capsule by mouth once daily  . furosemide (LASIX) 20 MG tablet Take 1 tablet (20 mg total) by mouth daily.  . hydrALAZINE (APRESOLINE) 50 MG tablet take 1 tablet by mouth four times a day  . Insulin Detemir (LEVEMIR FLEXTOUCH) 100 UNIT/ML SOPN INJECT 26 UNITS SUBCUTANEOUSLY AT BEDTIME OR AS DIRECTED  . losartan (COZAAR) 100 MG tablet take 1 tablet by mouth once daily  . metoprolol (TOPROL-XL) 200 MG 24 hr tablet Take 1 tablet (200 mg total) by mouth daily.  . mupirocin ointment (BACTROBAN) 2 %  Place 1 application into the nose 2 (two) times daily.  Marland Kitchen NEXIUM 40 MG capsule take 1 capsule by mouth once daily  . rosuvastatin (CRESTOR) 10 MG tablet Take 1 tablet (10 mg total) by mouth daily.  . sucralfate (CARAFATE) 1 G tablet Take 1 g by mouth 4 (four) times daily.   . tamsulosin (FLOMAX) 0.4 MG CAPS capsule Take 0.4 mg by mouth daily.  Marland Kitchen amoxicillin-clavulanate (AUGMENTIN) 500-125 MG per tablet Take 1 tablet (500 mg total) by mouth 3 (three) times daily.  . ergocalciferol (DRISDOL) 50000 UNITS capsule Take 1 capsule (50,000 Units total) by mouth once a week.  Marland Kitchen HYDROcodone-acetaminophen (NORCO) 10-325 MG per tablet Take 1 tablet by mouth every 8 (eight) hours as needed.  . pantoprazole (PROTONIX) 40 MG tablet Take 1 tablet (40 mg total) by mouth 2 (two) times daily.     Orders Placed This Encounter  Procedures  . DG Bone Density  . DG Foot Complete Right  . Vit D  25 hydroxy (rtn osteoporosis monitoring)  . Ambulatory referral to Orthopedic Surgery    No Follow-up on file.

## 2013-10-09 NOTE — Patient Instructions (Signed)
Continue icing the foot every few hours for 15 minutes and elevate it above heart  Suspend the flomax  Plain films today at [facility] office  vicodin every 8 hours as needed  Vitamin d level and DEXA scan ordered

## 2013-10-09 NOTE — Assessment & Plan Note (Signed)
Given history of gastric bypass , will rule out fracture.  Stop flomax  vicodin

## 2013-10-09 NOTE — Telephone Encounter (Signed)
Pt states he was speaking with triage, gave a lot of information and was then placed on hold.  States he thinks he broke his foot yesterday.  States while he was on hold that he was cut off/hung up on.  Transferred back to triage.

## 2013-10-10 LAB — VITAMIN D 25 HYDROXY (VIT D DEFICIENCY, FRACTURES): Vit D, 25-Hydroxy: 26 ng/mL — ABNORMAL LOW (ref 30–89)

## 2013-10-10 NOTE — Telephone Encounter (Signed)
Patient notified we will call when results are received.

## 2013-10-10 NOTE — Telephone Encounter (Signed)
The patient is calling for his X ray results that were done at [facility] on 3.3.15

## 2013-10-11 ENCOUNTER — Encounter: Payer: Self-pay | Admitting: *Deleted

## 2013-10-11 ENCOUNTER — Other Ambulatory Visit: Payer: Self-pay | Admitting: Internal Medicine

## 2013-10-11 ENCOUNTER — Encounter: Payer: Self-pay | Admitting: Internal Medicine

## 2013-10-11 DIAGNOSIS — E559 Vitamin D deficiency, unspecified: Secondary | ICD-10-CM | POA: Insufficient documentation

## 2013-10-11 DIAGNOSIS — S92301A Fracture of unspecified metatarsal bone(s), right foot, initial encounter for closed fracture: Secondary | ICD-10-CM | POA: Insufficient documentation

## 2013-10-11 DIAGNOSIS — S92309A Fracture of unspecified metatarsal bone(s), unspecified foot, initial encounter for closed fracture: Secondary | ICD-10-CM | POA: Insufficient documentation

## 2013-10-11 MED ORDER — ERGOCALCIFEROL 1.25 MG (50000 UT) PO CAPS: 50000.0000 [IU] | ORAL_CAPSULE | ORAL | Status: DC

## 2013-10-11 NOTE — Telephone Encounter (Signed)
Patient notified of results,

## 2013-10-11 NOTE — Assessment & Plan Note (Signed)
3, 4 and 5,  Oblique,  Nondisplaced.  Secondary to fall .  Ortho referral pending.  Vit D replacement in progress .  DEXA scan ordered given history of gastric bypass. vicodin for pain .

## 2013-10-11 NOTE — Assessment & Plan Note (Signed)
Mild but in the setting of MT fractures, will replace, with Drisdol weekly x 1 month

## 2013-10-30 ENCOUNTER — Telehealth: Payer: Self-pay | Admitting: Internal Medicine

## 2013-10-30 NOTE — Telephone Encounter (Signed)
The patient was given this medication when he broke the bones in his foot. He saw Dr. Derrel Nip. He still has his cast on.  Vicodin 10/325 mg

## 2013-10-30 NOTE — Telephone Encounter (Signed)
Please advise 

## 2013-10-31 MED ORDER — HYDROCODONE-ACETAMINOPHEN 10-325 MG PO TABS: 1.0000 | ORAL_TABLET | Freq: Three times a day (TID) | ORAL | Status: DC | PRN

## 2013-10-31 NOTE — Telephone Encounter (Signed)
Ok to refill,  printed rx  

## 2013-10-31 NOTE — Telephone Encounter (Signed)
Script ready for pick up. Patient notified placed up front.

## 2013-11-08 ENCOUNTER — Ambulatory Visit (INDEPENDENT_AMBULATORY_CARE_PROVIDER_SITE_OTHER): Payer: BC Managed Care – PPO | Admitting: Internal Medicine

## 2013-11-08 ENCOUNTER — Encounter: Payer: Self-pay | Admitting: Internal Medicine

## 2013-11-08 VITALS — BP 132/90 | HR 79 | Temp 98.2°F | Wt 188.0 lb

## 2013-11-08 DIAGNOSIS — E119 Type 2 diabetes mellitus without complications: Secondary | ICD-10-CM

## 2013-11-08 DIAGNOSIS — S92301A Fracture of unspecified metatarsal bone(s), right foot, initial encounter for closed fracture: Secondary | ICD-10-CM

## 2013-11-08 DIAGNOSIS — N400 Enlarged prostate without lower urinary tract symptoms: Secondary | ICD-10-CM

## 2013-11-08 DIAGNOSIS — E559 Vitamin D deficiency, unspecified: Secondary | ICD-10-CM

## 2013-11-08 DIAGNOSIS — D649 Anemia, unspecified: Secondary | ICD-10-CM

## 2013-11-08 DIAGNOSIS — S92309A Fracture of unspecified metatarsal bone(s), unspecified foot, initial encounter for closed fracture: Secondary | ICD-10-CM

## 2013-11-08 DIAGNOSIS — E669 Obesity, unspecified: Secondary | ICD-10-CM

## 2013-11-08 LAB — COMPREHENSIVE METABOLIC PANEL
ALK PHOS: 64 U/L (ref 39–117)
ALT: 21 U/L (ref 0–53)
AST: 18 U/L (ref 0–37)
Albumin: 3.8 g/dL (ref 3.5–5.2)
BILIRUBIN TOTAL: 0.7 mg/dL (ref 0.3–1.2)
BUN: 18 mg/dL (ref 6–23)
CO2: 27 meq/L (ref 19–32)
CREATININE: 1.2 mg/dL (ref 0.4–1.5)
Calcium: 9.5 mg/dL (ref 8.4–10.5)
Chloride: 102 mEq/L (ref 96–112)
GFR: 65.05 mL/min (ref >60.00)
GLUCOSE: 101 mg/dL — AB (ref 70–99)
Potassium: 3.6 mEq/L (ref 3.5–5.1)
Sodium: 138 mEq/L (ref 135–145)
Total Protein: 6.9 g/dL (ref 6.0–8.3)

## 2013-11-08 LAB — LIPID PANEL
CHOLESTEROL: 146 mg/dL (ref 0–200)
HDL: 44 mg/dL (ref >39.00)
LDL Cholesterol: 78 mg/dL (ref 0–99)
TRIGLYCERIDES: 121 mg/dL (ref 0.0–149.0)
Total CHOL/HDL Ratio: 3
VLDL: 24.2 mg/dL (ref 0.0–40.0)

## 2013-11-08 LAB — CBC WITH DIFFERENTIAL/PLATELET
Basophils Absolute: 0 10*3/uL (ref 0.0–0.1)
Basophils Relative: 0.3 % (ref 0.0–3.0)
Eosinophils Absolute: 0.1 10*3/uL (ref 0.0–0.7)
Eosinophils Relative: 1.3 % (ref 0.0–5.0)
HEMATOCRIT: 40.5 % (ref 39.0–52.0)
Hemoglobin: 13.5 g/dL (ref 13.0–17.0)
Lymphocytes Relative: 36.3 % (ref 12.0–46.0)
Lymphs Abs: 2.3 10*3/uL (ref 0.7–4.0)
MCHC: 33.4 g/dL (ref 30.0–36.0)
MCV: 95.3 fl (ref 78.0–100.0)
MONOS PCT: 8.6 % (ref 3.0–12.0)
Monocytes Absolute: 0.5 10*3/uL (ref 0.1–1.0)
Neutro Abs: 3.4 10*3/uL (ref 1.4–7.7)
Neutrophils Relative %: 53.5 % (ref 43.0–77.0)
Platelets: 198 10*3/uL (ref 150.0–400.0)
RBC: 4.25 Mil/uL (ref 4.22–5.81)
RDW: 14.1 % (ref 11.5–14.6)
WBC: 6.3 10*3/uL (ref 4.5–10.5)

## 2013-11-08 LAB — HEMOGLOBIN A1C: HEMOGLOBIN A1C: 6.3 % (ref 4.6–6.5)

## 2013-11-08 MED ORDER — TRAMADOL HCL 50 MG PO TABS: 50.0000 mg | ORAL_TABLET | Freq: Three times a day (TID) | ORAL | Status: DC | PRN

## 2013-11-08 NOTE — Progress Notes (Signed)
Pre visit review using our clinic review tool, if applicable. No additional management support is needed unless otherwise documented below in the visit note. 

## 2013-11-08 NOTE — Assessment & Plan Note (Signed)
On Flomax. Initially had some problems with orthostatic hypotension on this medication. However, tolerating well now. Symptoms of urinary hesitancy improved. Will continue medication. Will check PSA with labs. Follow up with Dr. Bernardo Heater as scheduled.

## 2013-11-08 NOTE — Assessment & Plan Note (Signed)
Doing well. Followed by Dr.Krasinski. Pain controlled with prn Tramadol or Hydrocodone. Will continue to follow with ortho. Will recheck Vit D today.

## 2013-11-08 NOTE — Progress Notes (Signed)
Subjective:    Patient ID: Justin Patel, male    DOB: 1951-01-23, 63 y.o.   MRN: [id number]  HPI 63YO male presents for follow up.  Right metatarsal fracture -  Occurred after fall at home. Seen by Dr. Mack Guise. Wearing foot brace. Has follow up today. Pain well controlled with Hydrocodone. Able to bear weight.Removes boot night. Driving with boot.  DM - BG have been well controlled 80-100. Compliant with medication. Continues to follow healthy diet, has lost another 10lbs approximately.  Urinary hesitency - continues on Flomax. Initially had issues with hypotension, but now tolerating without symptoms.  Abdominal pain - improved recently. Continues on Nexium. Stools have been "normal." No blood noted. S/p recent iron infusion.  Review of Systems  Constitutional: Negative for fever, chills, activity change, appetite change, fatigue and unexpected weight change.  Eyes: Negative for visual disturbance.  Respiratory: Negative for cough and shortness of breath.   Cardiovascular: Negative for chest pain, palpitations and leg swelling.  Gastrointestinal: Negative for abdominal pain and abdominal distention.  Genitourinary: Negative for dysuria, urgency, frequency and difficulty urinating.  Musculoskeletal: Positive for arthralgias and back pain. Negative for gait problem.  Skin: Negative for color change and rash.  Hematological: Negative for adenopathy.  Psychiatric/Behavioral: Negative for sleep disturbance and dysphoric mood. The patient is not nervous/anxious.        Objective:    BP 132/90  Pulse 79  Temp(Src) 98.2 F (36.8 C) (Oral)  Wt 188 lb (85.276 kg)  SpO2 96% Physical Exam  Constitutional: He is oriented to person, place, and time. He appears well-developed and well-nourished. No distress.  HENT:  Head: Normocephalic and atraumatic.  Right Ear: External ear normal.  Left Ear: External ear normal.  Nose: Nose normal.  Mouth/Throat: Oropharynx is clear and moist.  No oropharyngeal exudate.  Eyes: Conjunctivae and EOM are normal. Pupils are equal, round, and reactive to light. Right eye exhibits no discharge. Left eye exhibits no discharge. No scleral icterus.  Neck: Normal range of motion. Neck supple. No tracheal deviation present. No thyromegaly present.  Cardiovascular: Normal rate, regular rhythm and normal heart sounds.  Exam reveals no gallop and no friction rub.   No murmur heard. Pulmonary/Chest: Effort normal and breath sounds normal. No accessory muscle usage. Not tachypneic. No respiratory distress. He has no decreased breath sounds. He has no wheezes. He has no rhonchi. He has no rales. He exhibits no tenderness.  Musculoskeletal: He exhibits no edema.       Right ankle: He exhibits decreased range of motion (In ankle boot).  Lymphadenopathy:    He has no cervical adenopathy.  Neurological: He is alert and oriented to person, place, and time. No cranial nerve deficit. Coordination normal.  Skin: Skin is warm and dry. No rash noted. He is not diaphoretic. No erythema. No pallor.  Psychiatric: He has a normal mood and affect. His behavior is normal. Judgment and thought content normal.          Assessment & Plan:  Over 79min of which >50% spent in face-to-face contact with patient discussing plan of care  Problem List Items Addressed This Visit   Anemia     Anemia after recent upper GI bleeding. S/p iron infusion. Will check CBC with labs today.    Relevant Orders      CBC w/Diff   Enlarged prostate     On Flomax. Initially had some problems with orthostatic hypotension on this medication. However, tolerating well now. Symptoms of  urinary hesitancy improved. Will continue medication. Will check PSA with labs. Follow up with Dr. Bernardo Heater as scheduled.    Relevant Orders      PSA, total and free   Fracture of metatarsal bone of right foot     Doing well. Followed by Dr.Krasinski. Pain controlled with prn Tramadol or Hydrocodone. Will  continue to follow with ortho. Will recheck Vit D today.    Hypovitaminosis D     Will recheck Vit D level today.    Relevant Orders      Vitamin D (25 hydroxy)   Obesity, unspecified      Wt Readings from Last 3 Encounters:  11/08/13 188 lb (85.276 kg)  10/09/13 194 lb (87.998 kg)  08/27/13 194 lb (87.998 kg)   Congratulated on weight loss after gastric bypass surgery. Encouraged continued healthy diet and exercise as tolerated.    Type II or unspecified type diabetes mellitus without mention of complication, not stated as uncontrolled - Primary      Lab Results  Component Value Date   HGBA1C 6.2 07/10/2013   Will recheck A1c with labs today. Continue Levemir.    Relevant Orders      Comprehensive metabolic panel      Hemoglobin A1c      Lipid panel      Microalbumin / creatinine urine ratio       Return in about 3 months (around 02/07/2014) for Recheck of Diabetes.

## 2013-11-08 NOTE — Assessment & Plan Note (Signed)
Lab Results  Component Value Date   HGBA1C 6.2 07/10/2013   Will recheck A1c with labs today. Continue Levemir.

## 2013-11-08 NOTE — Assessment & Plan Note (Signed)
Wt Readings from Last 3 Encounters:  11/08/13 188 lb (85.276 kg)  10/09/13 194 lb (87.998 kg)  08/27/13 194 lb (87.998 kg)   Congratulated on weight loss after gastric bypass surgery. Encouraged continued healthy diet and exercise as tolerated.

## 2013-11-08 NOTE — Assessment & Plan Note (Signed)
Anemia after recent upper GI bleeding. S/p iron infusion. Will check CBC with labs today.

## 2013-11-08 NOTE — Assessment & Plan Note (Signed)
Will recheck Vit D level today 

## 2013-11-09 LAB — VITAMIN D 25 HYDROXY (VIT D DEFICIENCY, FRACTURES): VIT D 25 HYDROXY: 37 ng/mL (ref 30–89)

## 2013-11-09 LAB — PSA, TOTAL AND FREE
PSA, Free Pct: 25 % (ref >25)
PSA, Free: 0.16 ng/mL
PSA: 0.64 ng/mL (ref <=4.00)

## 2013-11-12 ENCOUNTER — Ambulatory Visit: Payer: Self-pay | Admitting: Internal Medicine

## 2013-11-12 ENCOUNTER — Encounter: Payer: Self-pay | Admitting: *Deleted

## 2013-11-12 LAB — MICROALBUMIN / CREATININE URINE RATIO
Creatinine,U: 104.8 mg/dL
Microalb Creat Ratio: 4.5 mg/g (ref 0.0–30.0)
Microalb, Ur: 4.7 mg/dL — ABNORMAL HIGH (ref 0.0–1.9)

## 2013-11-12 LAB — CANCER CENTER HEMOGLOBIN: HGB: 13 g/dL (ref 13.0–18.0)

## 2013-11-12 LAB — FERRITIN: Ferritin (ARMC): 128 ng/mL (ref 8–388)

## 2013-11-12 NOTE — Addendum Note (Signed)
Addended by: Karlene Einstein D on: 11/12/2013 10:31 AM   Modules accepted: Orders

## 2013-11-16 DIAGNOSIS — N138 Other obstructive and reflux uropathy: Secondary | ICD-10-CM | POA: Insufficient documentation

## 2013-11-16 DIAGNOSIS — N400 Enlarged prostate without lower urinary tract symptoms: Secondary | ICD-10-CM | POA: Insufficient documentation

## 2013-11-16 DIAGNOSIS — N401 Enlarged prostate with lower urinary tract symptoms: Secondary | ICD-10-CM

## 2013-11-30 ENCOUNTER — Telehealth: Payer: Self-pay

## 2013-11-30 NOTE — Telephone Encounter (Signed)
Relevant patient education mailed to patient.  

## 2013-12-03 ENCOUNTER — Other Ambulatory Visit: Payer: Self-pay | Admitting: Internal Medicine

## 2013-12-04 ENCOUNTER — Telehealth: Payer: Self-pay | Admitting: *Deleted

## 2013-12-04 ENCOUNTER — Other Ambulatory Visit: Payer: Self-pay | Admitting: *Deleted

## 2013-12-04 MED ORDER — TRAMADOL HCL 50 MG PO TABS: 50.0000 mg | ORAL_TABLET | Freq: Three times a day (TID) | ORAL | Status: DC | PRN

## 2013-12-04 NOTE — Telephone Encounter (Signed)
Rx printed and given to Dr. Walker for signature.  

## 2013-12-04 NOTE — Telephone Encounter (Signed)
Pt left vm.  Asking Almyra Free to call him regarding prescription refill.

## 2013-12-04 NOTE — Telephone Encounter (Signed)
Pt called asking for Tramadol refill, note states that he needs to be seen, Pt states that he is going out of town Friday and will need to be seen before then.

## 2013-12-04 NOTE — Telephone Encounter (Signed)
Fine to give refill on Tramadol

## 2013-12-04 NOTE — Telephone Encounter (Signed)
error 

## 2013-12-04 NOTE — Telephone Encounter (Signed)
Advised pt that Rx was sent to pharmacy

## 2013-12-07 ENCOUNTER — Ambulatory Visit: Payer: Self-pay | Admitting: Internal Medicine

## 2013-12-24 ENCOUNTER — Other Ambulatory Visit: Payer: Self-pay | Admitting: Internal Medicine

## 2013-12-26 ENCOUNTER — Other Ambulatory Visit: Payer: Self-pay | Admitting: Internal Medicine

## 2013-12-27 ENCOUNTER — Telehealth: Payer: Self-pay | Admitting: Internal Medicine

## 2013-12-27 ENCOUNTER — Other Ambulatory Visit: Payer: Self-pay | Admitting: Internal Medicine

## 2013-12-27 MED ORDER — TRAMADOL HCL 50 MG PO TABS: 100.0000 mg | ORAL_TABLET | Freq: Three times a day (TID) | ORAL | Status: DC | PRN

## 2013-12-27 NOTE — Telephone Encounter (Signed)
Faxed Rx to pharmacy  

## 2013-12-27 NOTE — Telephone Encounter (Signed)
traMADol (ULTRAM) 50 MG tablet  Please call the patient when it is ready

## 2013-12-27 NOTE — Telephone Encounter (Signed)
Fine to refill 

## 2013-12-27 NOTE — Telephone Encounter (Signed)
Okay to refill? 

## 2014-01-07 ENCOUNTER — Ambulatory Visit: Payer: Self-pay | Admitting: Internal Medicine

## 2014-01-17 LAB — FERRITIN: Ferritin (ARMC): 196 ng/mL (ref 8–388)

## 2014-01-17 LAB — CANCER CENTER HEMOGLOBIN: HGB: 13.6 g/dL (ref 13.0–18.0)

## 2014-01-23 ENCOUNTER — Other Ambulatory Visit: Payer: Self-pay | Admitting: Internal Medicine

## 2014-01-23 NOTE — Telephone Encounter (Signed)
Last OV 4.2.15, last refill 2.23.15.  Please advise refill.

## 2014-02-04 ENCOUNTER — Ambulatory Visit (INDEPENDENT_AMBULATORY_CARE_PROVIDER_SITE_OTHER): Payer: BC Managed Care – PPO | Admitting: Internal Medicine

## 2014-02-04 ENCOUNTER — Encounter: Payer: Self-pay | Admitting: Internal Medicine

## 2014-02-04 ENCOUNTER — Telehealth: Payer: Self-pay | Admitting: Internal Medicine

## 2014-02-04 ENCOUNTER — Ambulatory Visit (INDEPENDENT_AMBULATORY_CARE_PROVIDER_SITE_OTHER)
Admission: RE | Admit: 2014-02-04 | Discharge: 2014-02-04 | Disposition: A | Discharge status: Home / Self Care | Payer: BC Managed Care – PPO | Source: Ambulatory Visit | Attending: Internal Medicine | Admitting: Internal Medicine

## 2014-02-04 VITALS — BP 118/78 | HR 55 | Temp 98.2°F | Ht 67.0 in | Wt 188.5 lb

## 2014-02-04 DIAGNOSIS — E119 Type 2 diabetes mellitus without complications: Secondary | ICD-10-CM

## 2014-02-04 DIAGNOSIS — M25569 Pain in unspecified knee: Secondary | ICD-10-CM | POA: Insufficient documentation

## 2014-02-04 DIAGNOSIS — M25562 Pain in left knee: Secondary | ICD-10-CM | POA: Insufficient documentation

## 2014-02-04 DIAGNOSIS — J0100 Acute maxillary sinusitis, unspecified: Secondary | ICD-10-CM

## 2014-02-04 DIAGNOSIS — I10 Essential (primary) hypertension: Secondary | ICD-10-CM

## 2014-02-04 MED ORDER — INSULIN DETEMIR 100 UNIT/ML FLEXPEN: 20.0000 [IU] | PEN_INJECTOR | Freq: Every day | SUBCUTANEOUS | Status: DC

## 2014-02-04 MED ORDER — HYDRALAZINE HCL 50 MG PO TABS: 50.0000 mg | ORAL_TABLET | Freq: Two times a day (BID) | ORAL | Status: DC

## 2014-02-04 MED ORDER — AMOXICILLIN-POT CLAVULANATE 875-125 MG PO TABS: 1.0000 | ORAL_TABLET | Freq: Two times a day (BID) | ORAL | Status: DC

## 2014-02-04 MED ORDER — HYDROCODONE-ACETAMINOPHEN 10-325 MG PO TABS: 1.0000 | ORAL_TABLET | Freq: Three times a day (TID) | ORAL | Status: DC | PRN

## 2014-02-04 NOTE — Assessment & Plan Note (Signed)
Recent frequent episodes of hypoglycemia. Will decrease Levemir to 20units daily. Pt will call if any BG<70. Follow up 2 weeks.

## 2014-02-04 NOTE — Assessment & Plan Note (Signed)
Left knee pain after fall. Xray today shows diffuse OA but no acute injury, however limited by previous surgery and hardware in place. Recommend referral to ortho for evaluation. Hydrocodone prn severe pain. Follow up after ortho eval.

## 2014-02-04 NOTE — Telephone Encounter (Signed)
Pt needs 2 wk f/u appt (30 min).  No appts available week of, before, or after.  Please advise.  Pt states he can come any day, any time.

## 2014-02-04 NOTE — Progress Notes (Signed)
Subjective:    Patient ID: Justin Patel, male    DOB: 1951/04/18, 63 y.o.   MRN: [id number]  HPI 63YO male presents for acute visit.  Sinus congestion - Pressure over sinuses. Having purulent drainage with some blood mixed in. No fever. Occasional cough with yellow-green phlegm.  Fall on Saturday, down a flight of steps. Landed on left knee. Having severe pain, despite use of tramadol. Knee feels unstable.  DM - BG running low with frequent lows in 50s. Symptomatic with fatigue and lightheadedness. No syncope.  Review of Systems  Constitutional: Positive for fatigue. Negative for fever, chills, activity change, appetite change and unexpected weight change.  HENT: Positive for congestion, postnasal drip and sinus pressure. Negative for rhinorrhea, sore throat, trouble swallowing and voice change.   Eyes: Negative for visual disturbance.  Respiratory: Negative for cough and shortness of breath.   Cardiovascular: Negative for chest pain, palpitations and leg swelling.  Gastrointestinal: Negative for abdominal pain and abdominal distention.  Genitourinary: Negative for dysuria, urgency and difficulty urinating.  Musculoskeletal: Positive for arthralgias and myalgias. Negative for gait problem.  Skin: Negative for color change and rash.  Neurological: Positive for dizziness, weakness and light-headedness. Negative for syncope.  Hematological: Negative for adenopathy.  Psychiatric/Behavioral: Negative for sleep disturbance and dysphoric mood. The patient is not nervous/anxious.        Objective:    BP 118/78  Pulse 55  Temp(Src) 98.2 F (36.8 C) (Oral)  Ht 5\' 7"  (1.702 m)  Wt 188 lb 8 oz (85.503 kg)  BMI 29.52 kg/m2  SpO2 95% Physical Exam  Constitutional: He is oriented to person, place, and time. He appears well-developed and well-nourished. No distress.  HENT:  Head: Normocephalic and atraumatic.  Right Ear: External ear normal.  Left Ear: External ear normal.  Nose:  Mucosal edema present. Right sinus exhibits maxillary sinus tenderness. Left sinus exhibits maxillary sinus tenderness.  Mouth/Throat: Oropharynx is clear and moist. No oropharyngeal exudate.  Eyes: Conjunctivae and EOM are normal. Pupils are equal, round, and reactive to light. Right eye exhibits no discharge. Left eye exhibits no discharge. No scleral icterus.  Neck: Normal range of motion. Neck supple. No tracheal deviation present. No thyromegaly present.  Cardiovascular: Normal rate, regular rhythm and normal heart sounds.  Exam reveals no gallop and no friction rub.   No murmur heard. Pulmonary/Chest: Effort normal and breath sounds normal. No accessory muscle usage. Not tachypneic. No respiratory distress. He has no decreased breath sounds. He has no wheezes. He has no rhonchi. He has no rales. He exhibits no tenderness.  Musculoskeletal: Normal range of motion. He exhibits no edema.       Left knee: He exhibits swelling. He exhibits normal range of motion, no effusion and no erythema. Tenderness found.  Lymphadenopathy:    He has no cervical adenopathy.  Neurological: He is alert and oriented to person, place, and time. No cranial nerve deficit. Coordination normal.  Skin: Skin is warm and dry. No rash noted. He is not diaphoretic. No erythema. No pallor.  Psychiatric: He has a normal mood and affect. His behavior is normal. Judgment and thought content normal.          Assessment & Plan:   Problem List Items Addressed This Visit     Unprioritized   Acute maxillary sinusitis     Symptoms and exam c/w maxillary sinusitis. Will start Augmentin bid. Follow up for recheck in 2 weeks.    Relevant Medications  AMOXICILLIN-POT CLAVULANATE 875-125 MG PO TABS   Essential hypertension, benign      BP Readings from Last 3 Encounters:  02/04/14 118/78  11/08/13 132/90  10/09/13 102/68   Recent low BP. Will decrease Hydralazine to 50mg  po bid with plan to hold if BP<140/90. Follow  up for recheck in 2 weeks.    Relevant Medications      hydrALAZINE (APRESOLINE) tablet   Left knee pain - Primary     Left knee pain after fall. Xray today shows diffuse OA but no acute injury, however limited by previous surgery and hardware in place. Recommend referral to ortho for evaluation. Hydrocodone prn severe pain. Follow up after ortho eval.    Relevant Medications      HYDROcodone-acetaminophen (NORCO) 10-325 MG per tablet   Other Relevant Orders      DG Knee Complete 4 Views Left (Completed)   Type II or unspecified type diabetes mellitus without mention of complication, not stated as uncontrolled     Recent frequent episodes of hypoglycemia. Will decrease Levemir to 20units daily. Pt will call if any BG<70. Follow up 2 weeks.    Relevant Medications      Insulin Detemir (LEVEMIR FLEXTOUCH) 100 UNIT/ML Pen       Return in about 2 weeks (around 02/18/2014) for Recheck.

## 2014-02-04 NOTE — Progress Notes (Signed)
Pre visit review using our clinic review tool, if applicable. No additional management support is needed unless otherwise documented below in the visit note. 

## 2014-02-04 NOTE — Assessment & Plan Note (Signed)
Symptoms and exam c/w maxillary sinusitis. Will start Augmentin bid. Follow up for recheck in 2 weeks.

## 2014-02-04 NOTE — Patient Instructions (Addendum)
Please go to [facility] for an xray of your left knee today.  Continue to ice your knee several times per day.   Start Augmentin twice daily for sinus infection.  Cut dose of Levemir to 20units daily.  Decrease Hydralazine to 50mg  twice daily.  Follow up in 2 weeks or sooner as needed.

## 2014-02-04 NOTE — Assessment & Plan Note (Signed)
BP Readings from Last 3 Encounters:  02/04/14 118/78  11/08/13 132/90  10/09/13 102/68   Recent low BP. Will decrease Hydralazine to 50mg  po bid with plan to hold if BP<140/90. Follow up for recheck in 2 weeks.

## 2014-02-05 ENCOUNTER — Ambulatory Visit: Payer: BC Managed Care – PPO | Admitting: Internal Medicine

## 2014-02-05 NOTE — Telephone Encounter (Signed)
Pt called requesting Xray results.  Advised of results.  Pt states he would prefer to see knee specialist at [facility].  Please advise

## 2014-02-05 NOTE — Telephone Encounter (Signed)
Schedule appt?

## 2014-02-05 NOTE — Telephone Encounter (Signed)
We can work him in on Monday afternoon in 2 weeks as 58min visit. I will place a referral to [place].

## 2014-02-06 ENCOUNTER — Ambulatory Visit: Payer: Self-pay | Admitting: Internal Medicine

## 2014-02-13 ENCOUNTER — Ambulatory Visit: Payer: BC Managed Care – PPO | Admitting: Internal Medicine

## 2014-02-18 ENCOUNTER — Encounter: Payer: Self-pay | Admitting: Internal Medicine

## 2014-02-18 ENCOUNTER — Telehealth: Payer: Self-pay | Admitting: Internal Medicine

## 2014-02-18 ENCOUNTER — Ambulatory Visit (INDEPENDENT_AMBULATORY_CARE_PROVIDER_SITE_OTHER): Payer: BC Managed Care – PPO | Admitting: Internal Medicine

## 2014-02-18 VITALS — BP 82/60 | HR 84 | Temp 98.2°F | Ht 67.0 in | Wt 191.5 lb

## 2014-02-18 DIAGNOSIS — E119 Type 2 diabetes mellitus without complications: Secondary | ICD-10-CM

## 2014-02-18 DIAGNOSIS — M25569 Pain in unspecified knee: Secondary | ICD-10-CM

## 2014-02-18 DIAGNOSIS — R0789 Other chest pain: Secondary | ICD-10-CM | POA: Insufficient documentation

## 2014-02-18 DIAGNOSIS — I959 Hypotension, unspecified: Secondary | ICD-10-CM | POA: Insufficient documentation

## 2014-02-18 DIAGNOSIS — M25562 Pain in left knee: Secondary | ICD-10-CM

## 2014-02-18 DIAGNOSIS — R071 Chest pain on breathing: Secondary | ICD-10-CM

## 2014-02-18 LAB — CBC WITH DIFFERENTIAL/PLATELET
Basophils Absolute: 0 10*3/uL (ref 0.0–0.1)
Basophils Relative: 0.3 % (ref 0.0–3.0)
EOS ABS: 0.2 10*3/uL (ref 0.0–0.7)
Eosinophils Relative: 1.7 % (ref 0.0–5.0)
HCT: 38.2 % — ABNORMAL LOW (ref 39.0–52.0)
HEMOGLOBIN: 12.3 g/dL — AB (ref 13.0–17.0)
LYMPHS PCT: 25.6 % (ref 12.0–46.0)
Lymphs Abs: 2.3 10*3/uL (ref 0.7–4.0)
MCHC: 32.1 g/dL (ref 30.0–36.0)
MCV: 99 fl (ref 78.0–100.0)
MONOS PCT: 6.4 % (ref 3.0–12.0)
Monocytes Absolute: 0.6 10*3/uL (ref 0.1–1.0)
Neutro Abs: 5.9 10*3/uL (ref 1.4–7.7)
Neutrophils Relative %: 66 % (ref 43.0–77.0)
PLATELETS: 265 10*3/uL (ref 150.0–400.0)
RBC: 3.86 Mil/uL — ABNORMAL LOW (ref 4.22–5.81)
RDW: 14.7 % (ref 11.5–15.5)
WBC: 8.9 10*3/uL (ref 4.0–10.5)

## 2014-02-18 LAB — COMPREHENSIVE METABOLIC PANEL
ALT: 20 U/L (ref 0–53)
AST: 18 U/L (ref 0–37)
Albumin: 3.2 g/dL — ABNORMAL LOW (ref 3.5–5.2)
Alkaline Phosphatase: 54 U/L (ref 39–117)
BUN: 13 mg/dL (ref 6–23)
CALCIUM: 8.7 mg/dL (ref 8.4–10.5)
CHLORIDE: 110 meq/L (ref 96–112)
CO2: 23 meq/L (ref 19–32)
Creatinine, Ser: 1 mg/dL (ref 0.4–1.5)
GFR: 78.39 mL/min (ref >60.00)
Glucose, Bld: 62 mg/dL — ABNORMAL LOW (ref 70–99)
POTASSIUM: 3.9 meq/L (ref 3.5–5.1)
SODIUM: 142 meq/L (ref 135–145)
TOTAL PROTEIN: 5.8 g/dL — AB (ref 6.0–8.3)
Total Bilirubin: 0.4 mg/dL (ref 0.2–1.2)

## 2014-02-18 LAB — LIPID PANEL
CHOL/HDL RATIO: 3
Cholesterol: 139 mg/dL (ref 0–200)
HDL: 45.4 mg/dL (ref >39.00)
LDL Cholesterol: 60 mg/dL (ref 0–99)
NonHDL: 93.6
Triglycerides: 170 mg/dL — ABNORMAL HIGH (ref 0.0–149.0)
VLDL: 34 mg/dL (ref 0.0–40.0)

## 2014-02-18 LAB — HEMOGLOBIN A1C: Hgb A1c MFr Bld: 5.7 % (ref 4.6–6.5)

## 2014-02-18 MED ORDER — METOPROLOL SUCCINATE ER 100 MG PO TB24: 100.0000 mg | ORAL_TABLET | Freq: Every day | ORAL | Status: DC

## 2014-02-18 MED ORDER — INSULIN DETEMIR 100 UNIT/ML FLEXPEN: 10.0000 [IU] | PEN_INJECTOR | Freq: Every day | SUBCUTANEOUS | Status: DC

## 2014-02-18 NOTE — Patient Instructions (Addendum)
Please stop Hydralazine. Stop Lasix.  Please decrease Metoprolol to 100mg  daily, 1/2 tablet.  Email with blood pressure reading tomorrow  Please schedule nurse visit to recheck blood pressure Thursday or Friday.  Reduce dose of Levemir to 10units daily.  Labs today.

## 2014-02-18 NOTE — Progress Notes (Signed)
Pre visit review using our clinic review tool, if applicable. No additional management support is needed unless otherwise documented below in the visit note. 

## 2014-02-18 NOTE — Assessment & Plan Note (Signed)
BG running low. Will decrease Levemir to 10units daily. If any BG <70, then plan to stop Levemir.

## 2014-02-18 NOTE — Assessment & Plan Note (Signed)
Recent hypotension. Asymptomatic except for occasional lightheadedness with standing.Will stop Hydralazine and Lasix. Decrease metoprolol to 100mg  daily. Likely overmedicated in light of recent weight loss. Will have him RTC for BP recheck later this week.

## 2014-02-18 NOTE — Telephone Encounter (Signed)
Patient will need a prescription for his tramadol on July 23 to [facility] on [address] [phone].

## 2014-02-18 NOTE — Assessment & Plan Note (Signed)
Left lower chest wall pain after injury. Exam normal except for mild TTP. Discussed evaluation with CXR if symptoms persist, but will hold off for now.

## 2014-02-18 NOTE — Progress Notes (Signed)
Subjective:    Patient ID: Justin Patel, male    DOB: [birth year], 63 y.o.   MRN: [id number]  HPI 63YO male presents for follow up.  Knee pain - Improved. Has not yet seen ortho. Continues to be unable to fully straighten left leg.  Leaned to left side the other day and heard a "snap." Has not been able to lie on left side. Some pain with deep inspiration. Rib cage is sore to touch. However, was able to play this weekend with grandchildren. Feels that pain is improving.  BG continue to be low. 66 this morning. Feeling sweaty at times.  Has felt a little lightheaded recently. Particularly when standing.  Sinus infection improved, however still occasional bloody congestion. No fever, chills.  Review of Systems  Constitutional: Positive for fatigue. Negative for fever, chills, activity change, appetite change and unexpected weight change.  Eyes: Negative for visual disturbance.  Respiratory: Negative for cough and shortness of breath.   Cardiovascular: Positive for chest pain. Negative for palpitations and leg swelling.  Gastrointestinal: Negative for abdominal pain and abdominal distention.  Genitourinary: Negative for dysuria, urgency and difficulty urinating.  Musculoskeletal: Positive for arthralgias, gait problem, joint swelling and myalgias.  Skin: Negative for color change and rash.  Neurological: Positive for light-headedness.  Hematological: Negative for adenopathy.  Psychiatric/Behavioral: Negative for sleep disturbance and dysphoric mood. The patient is not nervous/anxious.        Objective:    BP 82/60  Pulse 84  Temp(Src) 98.2 F (36.8 C) (Oral)  Ht 5\' 7"  (1.702 m)  Wt 191 lb 8 oz (86.864 kg)  BMI 29.99 kg/m2  SpO2 96% Physical Exam  Constitutional: He is oriented to person, place, and time. He appears well-developed and well-nourished. No distress.  HENT:  Head: Normocephalic and atraumatic.  Right Ear: External ear normal.  Left Ear: External ear normal.   Nose: Nose normal.  Mouth/Throat: Oropharynx is clear and moist. No oropharyngeal exudate.  Eyes: Conjunctivae and EOM are normal. Pupils are equal, round, and reactive to light. Right eye exhibits no discharge. Left eye exhibits no discharge. No scleral icterus.  Neck: Normal range of motion. Neck supple. No tracheal deviation present. No thyromegaly present.  Cardiovascular: Normal rate, regular rhythm and normal heart sounds.  Exam reveals no gallop and no friction rub.   No murmur heard. Pulmonary/Chest: Effort normal and breath sounds normal. No accessory muscle usage. Not tachypneic. No respiratory distress. He has no decreased breath sounds. He has no wheezes. He has no rhonchi. He has no rales. He exhibits no tenderness.  Musculoskeletal: He exhibits no edema.       Left knee: He exhibits decreased range of motion and swelling.  Lymphadenopathy:    He has no cervical adenopathy.  Neurological: He is alert and oriented to person, place, and time. No cranial nerve deficit. Coordination normal.  Skin: Skin is warm and dry. No rash noted. He is not diaphoretic. No erythema. No pallor.  Psychiatric: He has a normal mood and affect. His behavior is normal. Judgment and thought content normal.          Assessment & Plan:   Problem List Items Addressed This Visit     Unprioritized   Chest wall pain     Left lower chest wall pain after injury. Exam normal except for mild TTP. Discussed evaluation with CXR if symptoms persist, but will hold off for now.    Hypotension, unspecified - Primary     Recent  hypotension. Asymptomatic except for occasional lightheadedness with standing.Will stop Hydralazine and Lasix. Decrease metoprolol to 100mg  daily. Likely overmedicated in light of recent weight loss. Will have him RTC for BP recheck later this week.    Relevant Medications      metoprolol succinate (TOPROL-XL) 24 hr tablet   Other Relevant Orders      CBC with Differential   Left knee  pain     Pain improved but continues to have swelling and decreased ROM. Will set up ortho evaluation. Question if he needs joint replacement.    Type II or unspecified type diabetes mellitus without mention of complication, not stated as uncontrolled     BG running low. Will decrease Levemir to 10units daily. If any BG <70, then plan to stop Levemir.    Relevant Medications      Insulin Detemir (LEVEMIR FLEXTOUCH) 100 UNIT/ML Pen   Other Relevant Orders      Comprehensive metabolic panel      Hemoglobin A1c      Lipid panel      Microalbumin / creatinine urine ratio       Return in about 4 weeks (around 03/18/2014) for Recheck.

## 2014-02-18 NOTE — Assessment & Plan Note (Signed)
Pain improved but continues to have swelling and decreased ROM. Will set up ortho evaluation. Question if he needs joint replacement.

## 2014-02-19 ENCOUNTER — Other Ambulatory Visit: Payer: Self-pay | Admitting: *Deleted

## 2014-02-19 MED ORDER — TRAMADOL HCL 50 MG PO TABS: 100.0000 mg | ORAL_TABLET | Freq: Three times a day (TID) | ORAL | Status: DC | PRN

## 2014-02-19 NOTE — Telephone Encounter (Signed)
Okay to refill? 

## 2014-02-19 NOTE — Telephone Encounter (Signed)
Yes, fine to refill Tramadol

## 2014-02-21 ENCOUNTER — Telehealth: Payer: Self-pay | Admitting: *Deleted

## 2014-02-21 ENCOUNTER — Ambulatory Visit (INDEPENDENT_AMBULATORY_CARE_PROVIDER_SITE_OTHER): Payer: BC Managed Care – PPO | Admitting: *Deleted

## 2014-02-21 DIAGNOSIS — I10 Essential (primary) hypertension: Secondary | ICD-10-CM

## 2014-02-21 LAB — MICROALBUMIN / CREATININE URINE RATIO
Creatinine,U: 191.1 mg/dL
Microalb Creat Ratio: 8.6 mg/g (ref 0.0–30.0)
Microalb, Ur: 16.4 mg/dL — ABNORMAL HIGH (ref 0.0–1.9)

## 2014-02-21 NOTE — Telephone Encounter (Signed)
Pt presented for BP check. After his visit 02/18/14, he stopped hydralazine and Lasix. Confirmed he decreased his Metoprolol to 100 mg daily. His reading at home this morning was 200/110. In office, BP 170/102, HR 80. Denies headache, chest pain, dizziness. Also states since stopping insulin, fasting glucose has been 80-110. Dr. Gilford Rile notified.  States we may call his home number and leave message if he is out with instructions.

## 2014-02-21 NOTE — Telephone Encounter (Signed)
Please have him increase his Metoprolol to 200mg  daily(his original dose). Have him monitor BP daily and email or call with readings. He will need a followup the week I am back, 69min

## 2014-02-21 NOTE — Progress Notes (Signed)
Pt presented for BP check. After his visit 02/18/14, he stopped hydralazine and Lasix. Confirmed he decreased his Metoprolol to 100 mg daily. His reading at home this morning was 200/110. In office, BP 170/102, HR 80. Denies headache, chest pain, dizziness. Also states since stopping insulin, fasting glucose has been 80-110. Dr. Gilford Rile notified.

## 2014-02-21 NOTE — Telephone Encounter (Signed)
Left message, notifying pt of instructions.

## 2014-02-27 ENCOUNTER — Telehealth: Payer: Self-pay | Admitting: Internal Medicine

## 2014-02-27 NOTE — Telephone Encounter (Signed)
BP readings   7.22.15 190/108 am heart rate 62  7.21.15 138/80  am heart rate  82  7.20.15 151/89 am heart rate  99  7.19.15 164/100 am heart rate  82  7.18.15 190/113 am heart rate  57  7.17.15 185/109 am heart rate   93  7.16.15 200/113 am heart rate  73  7.15.15 177/106 81

## 2014-02-27 NOTE — Telephone Encounter (Signed)
FYI

## 2014-02-28 NOTE — Telephone Encounter (Signed)
These readings are still elevated. Can you ask him to start back on his Hydralazine 50mg  twice daily and continue to send readings?

## 2014-02-28 NOTE — Telephone Encounter (Signed)
Notified pt. 

## 2014-03-05 ENCOUNTER — Ambulatory Visit: Payer: Self-pay | Admitting: General Practice

## 2014-03-05 LAB — BASIC METABOLIC PANEL
Anion Gap: 5 — ABNORMAL LOW (ref 7–16)
BUN: 15 mg/dL (ref 7–18)
CHLORIDE: 104 mmol/L (ref 98–107)
Calcium, Total: 8.9 mg/dL (ref 8.5–10.1)
Co2: 30 mmol/L (ref 21–32)
Creatinine: 1.02 mg/dL (ref 0.60–1.30)
EGFR (African American): >60
EGFR (Non-African Amer.): >60
Glucose: 89 mg/dL (ref 65–99)
Osmolality: 278 (ref 275–301)
Potassium: 4 mmol/L (ref 3.5–5.1)
SODIUM: 139 mmol/L (ref 136–145)

## 2014-03-05 LAB — CBC WITH DIFFERENTIAL/PLATELET
BASOS PCT: 0.5 %
Basophil #: 0 10*3/uL (ref 0.0–0.1)
Eosinophil #: 0.2 10*3/uL (ref 0.0–0.7)
Eosinophil %: 2.1 %
HCT: 34.4 % — ABNORMAL LOW (ref 40.0–52.0)
HGB: 11.1 g/dL — AB (ref 13.0–18.0)
LYMPHS ABS: 1.6 10*3/uL (ref 1.0–3.6)
LYMPHS PCT: 22.9 %
MCH: 32.5 pg (ref 26.0–34.0)
MCHC: 32.3 g/dL (ref 32.0–36.0)
MCV: 101 fL — ABNORMAL HIGH (ref 80–100)
Monocyte #: 0.7 x10 3/mm (ref 0.2–1.0)
Monocyte %: 10.5 %
NEUTROS PCT: 64 %
Neutrophil #: 4.6 10*3/uL (ref 1.4–6.5)
Platelet: 145 10*3/uL — ABNORMAL LOW (ref 150–440)
RBC: 3.42 10*6/uL — ABNORMAL LOW (ref 4.40–5.90)
RDW: 14.5 % (ref 11.5–14.5)
WBC: 7.2 10*3/uL (ref 3.8–10.6)

## 2014-03-13 ENCOUNTER — Encounter: Payer: Self-pay | Admitting: Internal Medicine

## 2014-03-13 ENCOUNTER — Ambulatory Visit (INDEPENDENT_AMBULATORY_CARE_PROVIDER_SITE_OTHER): Payer: BC Managed Care – PPO | Admitting: Internal Medicine

## 2014-03-13 VITALS — BP 138/78 | HR 86 | Ht 67.0 in | Wt 188.0 lb

## 2014-03-13 DIAGNOSIS — E119 Type 2 diabetes mellitus without complications: Secondary | ICD-10-CM

## 2014-03-13 DIAGNOSIS — I10 Essential (primary) hypertension: Secondary | ICD-10-CM

## 2014-03-13 DIAGNOSIS — M25562 Pain in left knee: Secondary | ICD-10-CM

## 2014-03-13 DIAGNOSIS — E669 Obesity, unspecified: Secondary | ICD-10-CM

## 2014-03-13 DIAGNOSIS — M25569 Pain in unspecified knee: Secondary | ICD-10-CM

## 2014-03-13 MED ORDER — SUCRALFATE 1 G PO TABS: 1.0000 g | ORAL_TABLET | Freq: Four times a day (QID) | ORAL | Status: DC

## 2014-03-13 NOTE — Progress Notes (Signed)
Subjective:    Patient ID: Justin Patel, male    DOB: 04/09/1951, 63 y.o.   MRN: [id number]  HPI 63YO male presents for follow up.  Exercising by swimming at [facility] pool daily. Scheduled for removal of left knee hardware and then knee replacement.  DM - BG mostly well controlled. One reading of 333, took 10 units of Levemir, dropped to 62.  Review of Systems  Constitutional: Positive for fatigue. Negative for fever, chills, activity change, appetite change and unexpected weight change.  Eyes: Negative for visual disturbance.  Respiratory: Negative for cough and shortness of breath.   Cardiovascular: Negative for chest pain, palpitations and leg swelling.  Gastrointestinal: Negative for abdominal pain and abdominal distention.  Genitourinary: Negative for dysuria, urgency and difficulty urinating.  Musculoskeletal: Positive for arthralgias and myalgias. Negative for gait problem.  Skin: Negative for color change and rash.  Hematological: Negative for adenopathy.  Psychiatric/Behavioral: Negative for sleep disturbance and dysphoric mood. The patient is not nervous/anxious.        Objective:    BP 138/78  Pulse 86  Ht 5\' 7"  (1.702 m)  Wt 188 lb (85.276 kg)  BMI 29.44 kg/m2  SpO2 98% Physical Exam  Constitutional: He is oriented to person, place, and time. He appears well-developed and well-nourished. No distress.  HENT:  Head: Normocephalic and atraumatic.  Right Ear: External ear normal.  Left Ear: External ear normal.  Nose: Nose normal.  Mouth/Throat: Oropharynx is clear and moist. No oropharyngeal exudate.  Eyes: Conjunctivae and EOM are normal. Pupils are equal, round, and reactive to light. Right eye exhibits no discharge. Left eye exhibits no discharge. No scleral icterus.  Neck: Normal range of motion. Neck supple. No tracheal deviation present. No thyromegaly present.  Cardiovascular: Normal rate, regular rhythm and normal heart sounds.  Exam reveals no  gallop and no friction rub.   No murmur heard. Pulmonary/Chest: Effort normal and breath sounds normal. No accessory muscle usage. Not tachypneic. No respiratory distress. He has no decreased breath sounds. He has no wheezes. He has no rhonchi. He has no rales. He exhibits no tenderness.  Musculoskeletal: He exhibits no edema.       Left knee: He exhibits decreased range of motion and deformity (lower leg internally rotated from knee).  Lymphadenopathy:    He has no cervical adenopathy.  Neurological: He is alert and oriented to person, place, and time. No cranial nerve deficit. Coordination normal.  Skin: Skin is warm and dry. No rash noted. He is not diaphoretic. No erythema. No pallor.  Psychiatric: He has a normal mood and affect. His behavior is normal. Judgment and thought content normal.          Assessment & Plan:   Problem List Items Addressed This Visit     Unprioritized   Essential hypertension, benign - Primary      BP Readings from Last 3 Encounters:  03/13/14 138/78  02/18/14 82/60  02/04/14 118/78   BP improved with reduction in dose of Metoprolol and Hydralazine, and discontinuation of Lasix. Will continue to monitor.    Relevant Medications      hydrALAZINE (APRESOLINE) 50 MG tablet   Left knee pain     Scheduled for left knee hardware removal followed by left knee replacement. Will follow.    Obesity, unspecified      Wt Readings from Last 3 Encounters:  03/13/14 188 lb (85.276 kg)  02/18/14 191 lb 8 oz (86.864 kg)  02/04/14 188 lb 8  oz (85.503 kg)   Body mass index is 29.44 kg/(m^2). Congratulated pt on weight loss. Encouraged continued healthy diet and exercise.    Type II or unspecified type diabetes mellitus without mention of complication, not stated as uncontrolled      Lab Results  Component Value Date   HGBA1C 5.7 02/18/2014   BG well controlled off insulin. Encouraged continued healthy diet and exercise with swimming.        Return in  about 3 months (around 06/13/2014) for Recheck of Diabetes.

## 2014-03-13 NOTE — Progress Notes (Signed)
Pre visit review using our clinic review tool, if applicable. No additional management support is needed unless otherwise documented below in the visit note. 

## 2014-03-13 NOTE — Assessment & Plan Note (Signed)
Wt Readings from Last 3 Encounters:  03/13/14 188 lb (85.276 kg)  02/18/14 191 lb 8 oz (86.864 kg)  02/04/14 188 lb 8 oz (85.503 kg)   Body mass index is 29.44 kg/(m^2). Congratulated pt on weight loss. Encouraged continued healthy diet and exercise.

## 2014-03-13 NOTE — Assessment & Plan Note (Signed)
Lab Results  Component Value Date   HGBA1C 5.7 02/18/2014   BG well controlled off insulin. Encouraged continued healthy diet and exercise with swimming.

## 2014-03-13 NOTE — Patient Instructions (Signed)
Continue current medications.  Labs and follow up in 3 months.

## 2014-03-13 NOTE — Assessment & Plan Note (Signed)
BP Readings from Last 3 Encounters:  03/13/14 138/78  02/18/14 82/60  02/04/14 118/78   BP improved with reduction in dose of Metoprolol and Hydralazine, and discontinuation of Lasix. Will continue to monitor.

## 2014-03-13 NOTE — Assessment & Plan Note (Signed)
Scheduled for left knee hardware removal followed by left knee replacement. Will follow.

## 2014-03-14 ENCOUNTER — Ambulatory Visit: Payer: Self-pay | Admitting: Internal Medicine

## 2014-03-14 ENCOUNTER — Telehealth: Payer: Self-pay | Admitting: Internal Medicine

## 2014-03-14 LAB — CBC CANCER CENTER
BASOS ABS: 0.1 x10 3/mm (ref 0.0–0.1)
Basophil %: 0.8 %
EOS ABS: 0.1 x10 3/mm (ref 0.0–0.7)
Eosinophil %: 0.7 %
HCT: 40.3 % (ref 40.0–52.0)
HGB: 13.1 g/dL (ref 13.0–18.0)
LYMPHS PCT: 22.9 %
Lymphocyte #: 2.1 x10 3/mm (ref 1.0–3.6)
MCH: 32.7 pg (ref 26.0–34.0)
MCHC: 32.4 g/dL (ref 32.0–36.0)
MCV: 101 fL — ABNORMAL HIGH (ref 80–100)
MONO ABS: 0.4 x10 3/mm (ref 0.2–1.0)
Monocyte %: 4.8 %
NEUTROS PCT: 70.8 %
Neutrophil #: 6.5 x10 3/mm (ref 1.4–6.5)
PLATELETS: 180 x10 3/mm (ref 150–440)
RBC: 3.99 10*6/uL — ABNORMAL LOW (ref 4.40–5.90)
RDW: 14.9 % — AB (ref 11.5–14.5)
WBC: 9.1 x10 3/mm (ref 3.8–10.6)

## 2014-03-14 LAB — FERRITIN: Ferritin (ARMC): 118 ng/mL (ref 8–388)

## 2014-03-14 NOTE — Telephone Encounter (Signed)
Relevant patient education mailed to patient.  

## 2014-03-18 ENCOUNTER — Ambulatory Visit: Payer: Self-pay | Admitting: General Practice

## 2014-04-01 ENCOUNTER — Ambulatory Visit: Payer: BC Managed Care – PPO | Admitting: Internal Medicine

## 2014-04-02 ENCOUNTER — Other Ambulatory Visit: Payer: Self-pay | Admitting: Internal Medicine

## 2014-04-09 ENCOUNTER — Ambulatory Visit: Payer: Self-pay | Admitting: Internal Medicine

## 2014-05-01 ENCOUNTER — Ambulatory Visit: Payer: Self-pay | Admitting: General Practice

## 2014-05-01 LAB — URINALYSIS, COMPLETE
Bacteria: NONE SEEN
Bilirubin,UR: NEGATIVE
Blood: NEGATIVE
Glucose,UR: NEGATIVE mg/dL (ref 0–75)
Hyaline Cast: 13
LEUKOCYTE ESTERASE: NEGATIVE
Nitrite: NEGATIVE
PH: 5 (ref 4.5–8.0)
RBC,UR: 2 /HPF (ref 0–5)
SPECIFIC GRAVITY: 1.031 (ref 1.003–1.030)
Squamous Epithelial: <1
WBC UR: 3 /HPF (ref 0–5)

## 2014-05-01 LAB — BASIC METABOLIC PANEL
ANION GAP: 6 — AB (ref 7–16)
BUN: 15 mg/dL (ref 7–18)
CO2: 31 mmol/L (ref 21–32)
CREATININE: 1.08 mg/dL (ref 0.60–1.30)
Calcium, Total: 8.5 mg/dL (ref 8.5–10.1)
Chloride: 104 mmol/L (ref 98–107)
EGFR (Non-African Amer.): >60
Glucose: 86 mg/dL (ref 65–99)
Osmolality: 281 (ref 275–301)
POTASSIUM: 3.5 mmol/L (ref 3.5–5.1)
Sodium: 141 mmol/L (ref 136–145)

## 2014-05-01 LAB — MRSA PCR SCREENING

## 2014-05-01 LAB — PROTIME-INR
INR: 1
PROTHROMBIN TIME: 12.6 s (ref 11.5–14.7)

## 2014-05-01 LAB — CBC
HCT: 37.5 % — AB (ref 40.0–52.0)
HGB: 12 g/dL — ABNORMAL LOW (ref 13.0–18.0)
MCH: 32 pg (ref 26.0–34.0)
MCHC: 31.9 g/dL — AB (ref 32.0–36.0)
MCV: 101 fL — ABNORMAL HIGH (ref 80–100)
PLATELETS: 162 10*3/uL (ref 150–440)
RBC: 3.73 10*6/uL — AB (ref 4.40–5.90)
RDW: 14.5 % (ref 11.5–14.5)
WBC: 6.2 10*3/uL (ref 3.8–10.6)

## 2014-05-01 LAB — SEDIMENTATION RATE: ERYTHROCYTE SED RATE: 20 mm/h (ref 0–20)

## 2014-05-01 LAB — APTT: Activated PTT: 24.8 secs (ref 23.6–35.9)

## 2014-05-02 LAB — URINE CULTURE

## 2014-05-09 ENCOUNTER — Ambulatory Visit: Payer: Self-pay | Admitting: Internal Medicine

## 2014-05-09 HISTORY — PX: TOTAL KNEE ARTHROPLASTY: SHX125

## 2014-05-09 LAB — CANCER CENTER HEMOGLOBIN: HGB: 12.6 g/dL — AB (ref 13.0–18.0)

## 2014-05-09 LAB — FERRITIN: Ferritin (ARMC): 93 ng/mL (ref 8–388)

## 2014-05-15 ENCOUNTER — Inpatient Hospital Stay: Payer: Self-pay | Admitting: General Practice

## 2014-05-16 LAB — BASIC METABOLIC PANEL
Anion Gap: 6 — ABNORMAL LOW (ref 7–16)
BUN: 13 mg/dL (ref 7–18)
CALCIUM: 7.8 mg/dL — AB (ref 8.5–10.1)
Chloride: 107 mmol/L (ref 98–107)
Co2: 28 mmol/L (ref 21–32)
Creatinine: 1.11 mg/dL (ref 0.60–1.30)
Glucose: 109 mg/dL — ABNORMAL HIGH (ref 65–99)
Osmolality: 282 (ref 275–301)
POTASSIUM: 4.1 mmol/L (ref 3.5–5.1)
Sodium: 141 mmol/L (ref 136–145)

## 2014-05-16 LAB — PLATELET COUNT: PLATELETS: 128 10*3/uL — AB (ref 150–440)

## 2014-05-16 LAB — HEMOGLOBIN: HGB: 9.5 g/dL — AB (ref 13.0–18.0)

## 2014-05-17 LAB — BASIC METABOLIC PANEL
ANION GAP: 8 (ref 7–16)
BUN: 10 mg/dL (ref 7–18)
CHLORIDE: 105 mmol/L (ref 98–107)
CO2: 27 mmol/L (ref 21–32)
Calcium, Total: 8.2 mg/dL — ABNORMAL LOW (ref 8.5–10.1)
Creatinine: 0.95 mg/dL (ref 0.60–1.30)
EGFR (African American): >60
EGFR (Non-African Amer.): >60
GLUCOSE: 112 mg/dL — AB (ref 65–99)
OSMOLALITY: 279 (ref 275–301)
POTASSIUM: 3.8 mmol/L (ref 3.5–5.1)
Sodium: 140 mmol/L (ref 136–145)

## 2014-05-17 LAB — HEMOGLOBIN: HGB: 8.9 g/dL — ABNORMAL LOW (ref 13.0–18.0)

## 2014-05-17 LAB — PLATELET COUNT: PLATELETS: 111 10*3/uL — AB (ref 150–440)

## 2014-05-23 ENCOUNTER — Other Ambulatory Visit: Payer: Self-pay | Admitting: Internal Medicine

## 2014-05-28 ENCOUNTER — Other Ambulatory Visit: Payer: Self-pay | Admitting: Internal Medicine

## 2014-05-28 DIAGNOSIS — Z96659 Presence of unspecified artificial knee joint: Secondary | ICD-10-CM | POA: Insufficient documentation

## 2014-06-03 ENCOUNTER — Encounter (HOSPITAL_COMMUNITY): Payer: Self-pay | Admitting: Emergency Medicine

## 2014-06-03 ENCOUNTER — Emergency Department (HOSPITAL_COMMUNITY): Payer: BC Managed Care – PPO

## 2014-06-03 ENCOUNTER — Inpatient Hospital Stay (HOSPITAL_COMMUNITY)
Admission: EM | Admit: 2014-06-03 | Discharge: 2014-06-07 | DRG: 084 | Disposition: A | Discharge status: Home Health | Payer: BC Managed Care – PPO | Attending: Family Medicine | Admitting: Family Medicine

## 2014-06-03 DIAGNOSIS — M1990 Unspecified osteoarthritis, unspecified site: Secondary | ICD-10-CM | POA: Diagnosis present

## 2014-06-03 DIAGNOSIS — M545 Low back pain, unspecified: Secondary | ICD-10-CM

## 2014-06-03 DIAGNOSIS — R269 Unspecified abnormalities of gait and mobility: Secondary | ICD-10-CM | POA: Diagnosis present

## 2014-06-03 DIAGNOSIS — K922 Gastrointestinal hemorrhage, unspecified: Secondary | ICD-10-CM

## 2014-06-03 DIAGNOSIS — R0789 Other chest pain: Secondary | ICD-10-CM

## 2014-06-03 DIAGNOSIS — S065X9A Traumatic subdural hemorrhage with loss of consciousness of unspecified duration, initial encounter: Secondary | ICD-10-CM | POA: Diagnosis not present

## 2014-06-03 DIAGNOSIS — G8929 Other chronic pain: Secondary | ICD-10-CM | POA: Diagnosis present

## 2014-06-03 DIAGNOSIS — M25562 Pain in left knee: Secondary | ICD-10-CM

## 2014-06-03 DIAGNOSIS — I2510 Atherosclerotic heart disease of native coronary artery without angina pectoris: Secondary | ICD-10-CM | POA: Diagnosis present

## 2014-06-03 DIAGNOSIS — R569 Unspecified convulsions: Secondary | ICD-10-CM

## 2014-06-03 DIAGNOSIS — D649 Anemia, unspecified: Secondary | ICD-10-CM | POA: Diagnosis present

## 2014-06-03 DIAGNOSIS — S065XAA Traumatic subdural hemorrhage with loss of consciousness status unknown, initial encounter: Secondary | ICD-10-CM | POA: Insufficient documentation

## 2014-06-03 DIAGNOSIS — Z833 Family history of diabetes mellitus: Secondary | ICD-10-CM

## 2014-06-03 DIAGNOSIS — R42 Dizziness and giddiness: Secondary | ICD-10-CM | POA: Diagnosis present

## 2014-06-03 DIAGNOSIS — E559 Vitamin D deficiency, unspecified: Secondary | ICD-10-CM

## 2014-06-03 DIAGNOSIS — S0101XA Laceration without foreign body of scalp, initial encounter: Secondary | ICD-10-CM | POA: Diagnosis present

## 2014-06-03 DIAGNOSIS — T1490 Injury, unspecified: Secondary | ICD-10-CM

## 2014-06-03 DIAGNOSIS — I619 Nontraumatic intracerebral hemorrhage, unspecified: Secondary | ICD-10-CM | POA: Insufficient documentation

## 2014-06-03 DIAGNOSIS — N281 Cyst of kidney, acquired: Secondary | ICD-10-CM

## 2014-06-03 DIAGNOSIS — N400 Enlarged prostate without lower urinary tract symptoms: Secondary | ICD-10-CM | POA: Diagnosis present

## 2014-06-03 DIAGNOSIS — R3911 Hesitancy of micturition: Secondary | ICD-10-CM

## 2014-06-03 DIAGNOSIS — K219 Gastro-esophageal reflux disease without esophagitis: Secondary | ICD-10-CM | POA: Diagnosis present

## 2014-06-03 DIAGNOSIS — E119 Type 2 diabetes mellitus without complications: Secondary | ICD-10-CM | POA: Diagnosis present

## 2014-06-03 DIAGNOSIS — G40909 Epilepsy, unspecified, not intractable, without status epilepticus: Secondary | ICD-10-CM | POA: Diagnosis present

## 2014-06-03 DIAGNOSIS — I10 Essential (primary) hypertension: Secondary | ICD-10-CM | POA: Diagnosis present

## 2014-06-03 DIAGNOSIS — Z9884 Bariatric surgery status: Secondary | ICD-10-CM

## 2014-06-03 DIAGNOSIS — Y92009 Unspecified place in unspecified non-institutional (private) residence as the place of occurrence of the external cause: Secondary | ICD-10-CM

## 2014-06-03 DIAGNOSIS — T1490XA Injury, unspecified, initial encounter: Secondary | ICD-10-CM

## 2014-06-03 DIAGNOSIS — R55 Syncope and collapse: Secondary | ICD-10-CM | POA: Diagnosis present

## 2014-06-03 DIAGNOSIS — S06349A Traumatic hemorrhage of right cerebrum with loss of consciousness of unspecified duration, initial encounter: Secondary | ICD-10-CM | POA: Diagnosis present

## 2014-06-03 DIAGNOSIS — W19XXXA Unspecified fall, initial encounter: Secondary | ICD-10-CM | POA: Diagnosis present

## 2014-06-03 LAB — BASIC METABOLIC PANEL
ANION GAP: 15 (ref 5–15)
BUN: 17 mg/dL (ref 6–23)
CHLORIDE: 104 meq/L (ref 96–112)
CO2: 19 mEq/L (ref 19–32)
Calcium: 9.4 mg/dL (ref 8.4–10.5)
Creatinine, Ser: 1.12 mg/dL (ref 0.50–1.35)
GFR calc non Af Amer: 68 mL/min — ABNORMAL LOW (ref >90)
GFR, EST AFRICAN AMERICAN: 79 mL/min — AB (ref >90)
Glucose, Bld: 83 mg/dL (ref 70–99)
POTASSIUM: 4 meq/L (ref 3.7–5.3)
SODIUM: 138 meq/L (ref 137–147)

## 2014-06-03 LAB — I-STAT TROPONIN, ED: TROPONIN I, POC: 0 ng/mL (ref 0.00–0.08)

## 2014-06-03 LAB — CBC WITH DIFFERENTIAL/PLATELET
Basophils Absolute: 0 10*3/uL (ref 0.0–0.1)
Basophils Relative: 0 % (ref 0–1)
EOS ABS: 0 10*3/uL (ref 0.0–0.7)
Eosinophils Relative: 0 % (ref 0–5)
HCT: 32.7 % — ABNORMAL LOW (ref 39.0–52.0)
HEMOGLOBIN: 10.7 g/dL — AB (ref 13.0–17.0)
LYMPHS ABS: 1.3 10*3/uL (ref 0.7–4.0)
Lymphocytes Relative: 14 % (ref 12–46)
MCH: 32.8 pg (ref 26.0–34.0)
MCHC: 32.7 g/dL (ref 30.0–36.0)
MCV: 100.3 fL — ABNORMAL HIGH (ref 78.0–100.0)
Monocytes Absolute: 0.6 10*3/uL (ref 0.1–1.0)
Monocytes Relative: 6 % (ref 3–12)
NEUTROS ABS: 7.7 10*3/uL (ref 1.7–7.7)
NEUTROS PCT: 80 % — AB (ref 43–77)
PLATELETS: 371 10*3/uL (ref 150–400)
RBC: 3.26 MIL/uL — AB (ref 4.22–5.81)
RDW: 14.9 % (ref 11.5–15.5)
WBC: 9.7 10*3/uL (ref 4.0–10.5)

## 2014-06-03 LAB — PROTIME-INR
INR: 0.99 (ref 0.00–1.49)
PROTHROMBIN TIME: 13.1 s (ref 11.6–15.2)

## 2014-06-03 LAB — TROPONIN I

## 2014-06-03 MED ORDER — MORPHINE SULFATE 2 MG/ML IJ SOLN
2.0000 mg | INTRAMUSCULAR | Status: DC | PRN
  Administered 2014-06-03 – 2014-06-04 (×2): 4 mg via INTRAVENOUS
  Administered 2014-06-04: 2 mg via INTRAVENOUS
  Administered 2014-06-04: 4 mg via INTRAVENOUS
  Administered 2014-06-04 – 2014-06-05 (×9): 2 mg via INTRAVENOUS
  Administered 2014-06-06 – 2014-06-07 (×5): 4 mg via INTRAVENOUS
  Filled 2014-06-03 (×3): qty 1
  Filled 2014-06-03 (×3): qty 2
  Filled 2014-06-03 (×2): qty 1
  Filled 2014-06-03: qty 2
  Filled 2014-06-03: qty 1
  Filled 2014-06-03 (×2): qty 2
  Filled 2014-06-03: qty 1
  Filled 2014-06-03: qty 2
  Filled 2014-06-03 (×3): qty 1
  Filled 2014-06-03 (×2): qty 2

## 2014-06-03 MED ORDER — MORPHINE SULFATE 2 MG/ML IJ SOLN
2.0000 mg | Freq: Once | INTRAMUSCULAR | Status: AC
  Administered 2014-06-03: 2 mg via INTRAVENOUS
  Filled 2014-06-03: qty 1

## 2014-06-03 MED ORDER — INSULIN ASPART 100 UNIT/ML ~~LOC~~ SOLN
0.0000 [IU] | SUBCUTANEOUS | Status: DC
  Administered 2014-06-04: 1 [IU] via SUBCUTANEOUS

## 2014-06-03 MED ORDER — PANTOPRAZOLE SODIUM 40 MG PO TBEC
80.0000 mg | DELAYED_RELEASE_TABLET | Freq: Every day | ORAL | Status: DC
  Administered 2014-06-04 – 2014-06-07 (×4): 80 mg via ORAL
  Filled 2014-06-03 (×4): qty 2

## 2014-06-03 MED ORDER — METOPROLOL SUCCINATE ER 100 MG PO TB24
100.0000 mg | ORAL_TABLET | Freq: Every day | ORAL | Status: DC
  Administered 2014-06-04 – 2014-06-07 (×4): 100 mg via ORAL
  Filled 2014-06-03 (×4): qty 1

## 2014-06-03 MED ORDER — HYDRALAZINE HCL 50 MG PO TABS
50.0000 mg | ORAL_TABLET | Freq: Two times a day (BID) | ORAL | Status: DC
  Administered 2014-06-04 – 2014-06-07 (×8): 50 mg via ORAL
  Filled 2014-06-03 (×8): qty 1

## 2014-06-03 MED ORDER — SODIUM CHLORIDE 0.9 % IV SOLN
INTRAVENOUS | Status: DC
  Administered 2014-06-03: 23:00:00 via INTRAVENOUS
  Administered 2014-06-05 – 2014-06-06 (×2): 1000 mL via INTRAVENOUS
  Administered 2014-06-07: 08:00:00 via INTRAVENOUS

## 2014-06-03 MED ORDER — TRAMADOL HCL 50 MG PO TABS: 100.0000 mg | ORAL_TABLET | Freq: Three times a day (TID) | ORAL | Status: DC | PRN

## 2014-06-03 MED ORDER — ROSUVASTATIN CALCIUM 10 MG PO TABS
10.0000 mg | ORAL_TABLET | Freq: Every day | ORAL | Status: DC
  Administered 2014-06-04 – 2014-06-07 (×4): 10 mg via ORAL
  Filled 2014-06-03 (×4): qty 1

## 2014-06-03 MED ORDER — HYDRALAZINE HCL 20 MG/ML IJ SOLN
10.0000 mg | Freq: Once | INTRAMUSCULAR | Status: AC
  Administered 2014-06-03: 10 mg via INTRAVENOUS
  Filled 2014-06-03: qty 1

## 2014-06-03 MED ORDER — HYDROMORPHONE HCL 1 MG/ML IJ SOLN
0.5000 mg | INTRAMUSCULAR | Status: AC | PRN
Start: 2014-06-03 — End: 2014-06-03
  Administered 2014-06-03: 0.5 mg via INTRAVENOUS
  Filled 2014-06-03: qty 1

## 2014-06-03 MED ORDER — SUCRALFATE 1 G PO TABS
1.0000 g | ORAL_TABLET | Freq: Four times a day (QID) | ORAL | Status: DC
  Administered 2014-06-04 – 2014-06-07 (×15): 1 g via ORAL
  Filled 2014-06-03 (×15): qty 1

## 2014-06-03 MED ORDER — HYDROMORPHONE HCL 1 MG/ML IJ SOLN
0.5000 mg | Freq: Once | INTRAMUSCULAR | Status: AC
  Administered 2014-06-03: 0.5 mg via INTRAVENOUS
  Filled 2014-06-03: qty 1

## 2014-06-03 MED ORDER — TETANUS-DIPHTH-ACELL PERTUSSIS 5-2.5-18.5 LF-MCG/0.5 IM SUSP
0.5000 mL | Freq: Once | INTRAMUSCULAR | Status: AC
  Administered 2014-06-03: 0.5 mL via INTRAMUSCULAR
  Filled 2014-06-03: qty 0.5

## 2014-06-03 MED ORDER — LOSARTAN POTASSIUM 50 MG PO TABS
50.0000 mg | ORAL_TABLET | Freq: Every day | ORAL | Status: DC
  Administered 2014-06-04 – 2014-06-07 (×4): 50 mg via ORAL
  Filled 2014-06-03 (×4): qty 1

## 2014-06-03 MED ORDER — MORPHINE SULFATE 2 MG/ML IJ SOLN
2.0000 mg | Freq: Once | INTRAMUSCULAR | Status: DC
  Filled 2014-06-03: qty 1

## 2014-06-03 MED ORDER — SODIUM CHLORIDE 0.9 % IJ SOLN
3.0000 mL | Freq: Two times a day (BID) | INTRAMUSCULAR | Status: DC
  Administered 2014-06-04 – 2014-06-07 (×4): 3 mL via INTRAVENOUS

## 2014-06-03 NOTE — ED Notes (Signed)
12 lead by EMS, unremarkable

## 2014-06-03 NOTE — ED Provider Notes (Signed)
CSN: [id number]     Arrival date & time 06/03/14  1439 History   First MD Initiated Contact with Patient 06/03/14 1455     Chief Complaint  Patient presents with  . Fall     (Consider location/radiation/quality/duration/timing/severity/associated sxs/prior Treatment) HPI Justin Patel is a 63 y.o. male status post left knee arthroplasty 3 weeks ago at [facility], currently on Lovenox injections who comes in for evaluation after a fall. 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 Per EMS patient had "seizure-like activity" immediately after the fall. Patient does not remember anything leading up to fall or the fall itself. Denies headache, nausea, vomiting, numbness or weakness. Patient is alert and oriented to person place and time, is mentating appropriately and is speaking with goal oriented speech. Pt also reports having had too much alcohol Saturday evening and falling and hitting his head/ear on a table. Patient does report a history of iron deficiency anemia  Past Medical History  Diagnosis Date  . Hypertension   . Anxiety   . Sleep apnea     hx not now since wt loss  . Diabetes mellitus   . Stones in the urinary tract   . GERD (gastroesophageal reflux disease)   . H/O hiatal hernia   . Headache(784.0)   . Arthritis   . 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 Gastric ulcer   . Degenerative disc disease   . Sacral fracture, closed    Past Surgical History  Procedure Laterality Date  . Cardiac catheterization      [facility], normal  . Hernia repair  2000    hiatal  . Gastric bypass  2010    [place]  . Knee arthroscopy      bilateral, left x 2  . Osteotomy  2001    left  . Tonsillectomy    . Appendectomy    . Nasal sinus surgery      x5  . Uvulopalatoplasty  2011  . Anterior cervical decomp/discectomy fusion  01/26/2012     Procedure: ANTERIOR CERVICAL DECOMPRESSION/DISCECTOMY FUSION 3 LEVELS;  Surgeon: Floyce Stakes, MD;  Location: [facility];  Service: Neurosurgery;  Laterality: N/A;  Cervical three-four Cervical four-five Cervical five-six Cervical six-seven , Anterior cervical decompression/diskectomy, fusion, plate   Family History  Problem Relation Age of Onset  . Dementia Mother 79  . Heart disease Father 54  . Diabetes Father   . Cancer Sister     lymphoma, stage 4  . Cancer Sister 69    Ovarian  . Lupus Sister    History  Substance Use Topics  . Smoking status: Never Smoker   . Smokeless tobacco: Not on file  . Alcohol Use: 0.6 oz/week    1 Glasses of wine per week     Comment: occasional    Review of Systems  Constitutional: Negative for fever.  HENT: Negative for sore throat.   Eyes: Negative for visual disturbance.  Respiratory: Negative for shortness of breath.   Cardiovascular: Negative for chest pain.  Gastrointestinal: Negative for abdominal pain.  Endocrine: Negative for polyuria.  Genitourinary: Negative for dysuria.  Musculoskeletal: Positive for neck pain.  Skin: Negative for rash.  Neurological: Positive for syncope and headaches.  Hematological: Bruises/bleeds easily.      Allergies  Altace; Levaquin; and Iohexol  Home  Medications   Prior to Admission medications   Medication Sig Start Date End Date Taking? Authorizing Provider  acetaminophen (TYLENOL) 500 MG tablet Take 1,000 mg by mouth every 6 (six) hours as needed.   Yes Historical Provider, MD  CRESTOR 10 MG tablet take 1 tablet by mouth once daily 12/26/13  Yes Jackolyn Confer, MD  DULoxetine (CYMBALTA) 60 MG capsule take 1 capsule by mouth once daily 05/23/14  Yes Jackolyn Confer, MD  hydrALAZINE (APRESOLINE) 50 MG tablet Take 50 mg by mouth 2 (two) times daily.   Yes Historical Provider, MD  losartan (COZAAR) 100 MG tablet take 1 tablet by mouth once daily 04/02/14  Yes Jackolyn Confer, MD   metoprolol (TOPROL-XL) 100 MG 24 hr tablet Take 1 tablet (100 mg total) by mouth daily. 02/18/14  Yes Jackolyn Confer, MD  mupirocin ointment (BACTROBAN) 2 % Place 1 application into the nose 2 (two) times daily.   Yes Historical Provider, MD  NEXIUM 40 MG capsule take 1 capsule by mouth once daily 09/02/13  Yes Jackolyn Confer, MD  sucralfate (CARAFATE) 1 G tablet Take 1 tablet (1 g total) by mouth 4 (four) times daily. 03/13/14  Yes Jackolyn Confer, MD  tamsulosin (FLOMAX) 0.4 MG CAPS capsule Take 0.4 mg by mouth daily.   Yes Historical Provider, MD  traMADol (ULTRAM) 50 MG tablet Take 2 tablets (100 mg total) by mouth every 8 (eight) hours as needed for severe pain. 02/28/14  Yes Jackolyn Confer, MD  VITAMIN D, CHOLECALCIFEROL, PO Take 1,000 mg by mouth.    Yes Historical Provider, MD   BP 164/81  Pulse 78  Temp(Src) 99.5 F (37.5 C) (Oral)  Resp 17  Ht 5\' 7"  (1.702 m)  Wt 172 lb (78.019 kg)  BMI 26.93 kg/m2  SpO2 98% Physical Exam  Nursing note and vitals reviewed. Constitutional: He is oriented to person, place, and time. He appears well-developed and well-nourished.  HENT:  Head: Normocephalic.  Mouth/Throat: Oropharynx is clear and moist.  3cm linear/oblique, well approximated laceration to R occiput. Approximately 0.5 cm deep  Eyes: Conjunctivae are normal. Pupils are equal, round, and reactive to light. Right eye exhibits no discharge. Left eye exhibits no discharge. No scleral icterus.  Neck: Neck supple. No JVD present. No tracheal deviation present.  Mild ttp of paravertebral cervical muscles. No midline bony tenderness. No other obvious lesions or rashes. Full Cervical ROM w/o discomfort.  Cardiovascular: Normal rate, regular rhythm and normal heart sounds.   Pulmonary/Chest: Effort normal and breath sounds normal. No respiratory distress. He has no wheezes. He has no rales.  Abdominal: Soft. He exhibits no distension. There is no tenderness.  Musculoskeletal: He  exhibits no tenderness.  Neurological: He is alert and oriented to person, place, and time.  Cranial Nerves II-XII grossly intact. No focal neuro deficits. Neurovascularly intact. Alert and Oriented x 4  Skin: Skin is warm and dry. No rash noted.  Psychiatric: He has a normal mood and affect.    ED Course  Procedures (including critical care time) Labs Review Labs Reviewed  CBC WITH DIFFERENTIAL - Abnormal; Notable for the following:    RBC 3.26 (*)    Hemoglobin 10.7 (*)    HCT 32.7 (*)    MCV 100.3 (*)    Neutrophils Relative % 80 (*)    All other components within normal limits  BASIC METABOLIC PANEL - Abnormal; Notable for the following:    GFR calc non Af Wyvonnia Lora  68 (*)    GFR calc Af Amer 79 (*)    All other components within normal limits  COMPREHENSIVE METABOLIC PANEL - Abnormal; Notable for the following:    Glucose, Bld 101 (*)    Albumin 3.0 (*)    Total Bilirubin 0.2 (*)    GFR calc non Af Amer 70 (*)    GFR calc Af Amer 82 (*)    All other components within normal limits  CBC WITH DIFFERENTIAL - Abnormal; Notable for the following:    RBC 3.02 (*)    Hemoglobin 9.6 (*)    HCT 29.5 (*)    All other components within normal limits  GLUCOSE, CAPILLARY - Abnormal; Notable for the following:    Glucose-Capillary 171 (*)    All other components within normal limits  GLUCOSE, CAPILLARY - Abnormal; Notable for the following:    Glucose-Capillary 127 (*)    All other components within normal limits  PROTIME-INR  TROPONIN I  TROPONIN I  GLUCOSE, CAPILLARY  TROPONIN I  HEMOGLOBIN A1C  I-STAT TROPOININ, ED    Imaging Review Dg Chest 2 View  06/03/2014   CLINICAL DATA:  Loss of consciousness after fall.  EXAM: CHEST  2 VIEW  COMPARISON:  January 25, 2012.  FINDINGS: The heart size and mediastinal contours are within normal limits. Both lungs are clear. No pneumothorax or pleural effusion is noted. The visualized skeletal structures are unremarkable.  IMPRESSION: No acute  cardiopulmonary abnormality seen.   Electronically Signed   By: Sabino Dick M.D.   On: 06/03/2014 22:03   Ct Head Wo Contrast  06/03/2014   CLINICAL DATA:  Patient stood up to open a door, then passed out and fell backwards hitting his head. Patient is taking Lovenox injections. Patient hit his head on a hardwood floor. There was reported seizure-like activity following the fall. Patient has a posterior scalp laceration.  EXAM: CT HEAD WITHOUT CONTRAST  CT CERVICAL SPINE WITHOUT CONTRAST  TECHNIQUE: Multidetector CT imaging of the head and cervical spine was performed following the standard protocol without intravenous contrast. Multiplanar CT image reconstructions of the cervical spine were also generated.  COMPARISON:  Cervical CT, 12/30/2011.  No prior head CT.  FINDINGS: CT HEAD FINDINGS  A small amount of acute hemorrhage extends along the interhemispheric fissure above the corpus callosum, most likely subdural in location. Small amount of adjacent parenchymal hemorrhage is seen in the medial right frontal lobe extending over an area of approximately 13 mm x 13 mm.  No other intracranial hemorrhage.  There are no parenchymal masses or mass effect. The ventricles are normal in configuration. There is age related ventricular and sulcal enlargement. No hydrocephalus.  There is no evidence of an ischemic infarct.  No extra-axial masses.  No skull fracture. There are changes from previous sinus surgery. Minor mucosal thickening lines remaining ethmoid air cells. Mastoid air cells are clear.  A small amount of air lies in the right posterior scalp soft tissues with a small amount of associated hemorrhage reflecting the patient's laceration. No radiopaque foreign body.  CT CERVICAL SPINE FINDINGS  There has been a previous long anterior cervical spine fusion from C3 through C7. Anterior fusion plate and fixation screws are well-seated. There is mature bone graft material across the C3-C4 through C6-C7 disc  spaces.  There is a grade 1 anterolisthesis of C7 on T1. Moderate loss of disc height is noted at this level. There are disc degenerative changes bilaterally also at this level.  This is a chronic finding.  There is no fracture or acute finding.  End plate productive change at C5-C6 and C6-C7 indents the ventral thecal sac causing at least mild central stenosis. There are multiple levels of neural foraminal narrowing, greatest bilaterally at C6-C7 severe on the right and moderate on the left.  Bones are demineralized.  Surrounding soft tissues are unremarkable.  Lung apices are clear.  IMPRESSION: HEAD CT: There is a small amount of extra-axial intracranial hemorrhage along the interhemispheric fissure adjacent to the corpus callosum, likely subdural. There is also a small amount of adjacent parenchymal hemorrhage in the medial right frontal lobe measuring 13 mm size.  No other intracranial hemorrhage.  No skull fracture.  CERVICAL CT: No fracture or acute finding. No evidence of disruption of the anterior fusion hardware.   Electronically Signed   By: Lajean Manes M.D.   On: 06/03/2014 16:00   Ct Cervical Spine Wo Contrast  06/03/2014   CLINICAL DATA:  Patient stood up to open a door, then passed out and fell backwards hitting his head. Patient is taking Lovenox injections. Patient hit his head on a hardwood floor. There was reported seizure-like activity following the fall. Patient has a posterior scalp laceration.  EXAM: CT HEAD WITHOUT CONTRAST  CT CERVICAL SPINE WITHOUT CONTRAST  TECHNIQUE: Multidetector CT imaging of the head and cervical spine was performed following the standard protocol without intravenous contrast. Multiplanar CT image reconstructions of the cervical spine were also generated.  COMPARISON:  Cervical CT, 12/30/2011.  No prior head CT.  FINDINGS: CT HEAD FINDINGS  A small amount of acute hemorrhage extends along the interhemispheric fissure above the corpus callosum, most likely subdural  in location. Small amount of adjacent parenchymal hemorrhage is seen in the medial right frontal lobe extending over an area of approximately 13 mm x 13 mm.  No other intracranial hemorrhage.  There are no parenchymal masses or mass effect. The ventricles are normal in configuration. There is age related ventricular and sulcal enlargement. No hydrocephalus.  There is no evidence of an ischemic infarct.  No extra-axial masses.  No skull fracture. There are changes from previous sinus surgery. Minor mucosal thickening lines remaining ethmoid air cells. Mastoid air cells are clear.  A small amount of air lies in the right posterior scalp soft tissues with a small amount of associated hemorrhage reflecting the patient's laceration. No radiopaque foreign body.  CT CERVICAL SPINE FINDINGS  There has been a previous long anterior cervical spine fusion from C3 through C7. Anterior fusion plate and fixation screws are well-seated. There is mature bone graft material across the C3-C4 through C6-C7 disc spaces.  There is a grade 1 anterolisthesis of C7 on T1. Moderate loss of disc height is noted at this level. There are disc degenerative changes bilaterally also at this level. This is a chronic finding.  There is no fracture or acute finding.  End plate productive change at C5-C6 and C6-C7 indents the ventral thecal sac causing at least mild central stenosis. There are multiple levels of neural foraminal narrowing, greatest bilaterally at C6-C7 severe on the right and moderate on the left.  Bones are demineralized.  Surrounding soft tissues are unremarkable.  Lung apices are clear.  IMPRESSION: HEAD CT: There is a small amount of extra-axial intracranial hemorrhage along the interhemispheric fissure adjacent to the corpus callosum, likely subdural. There is also a small amount of adjacent parenchymal hemorrhage in the medial right frontal lobe measuring 13 mm size.  No other intracranial hemorrhage.  No skull fracture.   CERVICAL CT: No fracture or acute finding. No evidence of disruption of the anterior fusion hardware.   Electronically Signed   By: Lajean Manes M.D.   On: 06/03/2014 16:00     EKG Interpretation   Date/Time:  Monday June 03 2014 14:52:41 EDT Ventricular Rate:  87 PR Interval:  144 QRS Duration: 85 QT Interval:  447 QTC Calculation: 538 R Axis:   44 Text Interpretation:  Sinus rhythm Borderline T abnormalities, lateral  leads Prolonged QT interval Baseline wander in lead(s) II III aVF Since  previous tracing QT has lengthened Confirmed by Canary Brim  MD, MARTHA [phone])  on 06/03/2014 4:20:34 PM     LACERATION REPAIR Performed by: Verl Dicker Authorized by: Verl Dicker Consent: Verbal consent obtained. Risks and benefits: risks, benefits and alternatives were discussed Consent given by: patient Patient identity confirmed: provided demographic data Prepped and Draped in normal sterile fashion Wound explored  Laceration Location: R Occiput  Laceration Length: 3 cm  No Foreign Bodies seen or palpated  Anesthesia: local infiltration  Local anesthetic: None   Anesthetic total: 0 ml  Irrigation method: syringe Amount of cleaning: standard  Skin closure: Staples   Number of sutures: 3 staples   Technique:   Patient tolerance: Patient tolerated the procedure well with no immediate complications.  MDM  Vitals stable - WNL -afebrile Pt resting comfortably in ED. pain managed in ED PE not concerning for any other acute or emergent pathology. No focal neuro deficits. Head lack repaired at bedside with 3 staples. Labwork noncontributory Imaging shows small amount of acute hemorrhage along the interhemispheric fissure above the corpus callosum most likely subdural. Also small amount of remarkable hemorrhage medial right frontal lobe.  Spoke with Dr. Annette Stable from neurosurgery he recommends 12 hour repeat head CT outpatient.  Trauma Surgery Consulted, they  will come see patient in the ED.  Care transferred to Dr. Colin Rhein and Zacarias Pontes, PA-C    I discussed and reviewed this case with Dr.Linker  Final diagnoses:  [place], [place] 06/04/14 [phone]

## 2014-06-03 NOTE — ED Provider Notes (Signed)
  Physical Exam  BP 162/87  Pulse 91  Temp(Src) 99.8 F (37.7 C) (Oral)  Resp 18  SpO2 99%  Physical Exam  ED Course  Procedures  MDM Assumed care of pt from Dr. Canary Brim in stable condition awaiting likely admission for SAH/SDH after syncopal fall from standing on lovenox.  Pt had persistent ha and HTN.  Consulted NSU who recommended CT scan in 12 hours.  Consulted trauma who deferred to medicine for admission. Consulted medicine who requested neurology consult at some point.  Neurology consulted (Dr. Doy Mince) who will follow.  Admitted in stable condition.      Debby Freiberg, MD 06/03/14 [phone]

## 2014-06-03 NOTE — ED Notes (Signed)
C-collar removed by PA

## 2014-06-03 NOTE — ED Notes (Signed)
Pt eating and drinking

## 2014-06-03 NOTE — ED Notes (Signed)
CT notified pt needs scan next, pt will be bumped up the list.

## 2014-06-03 NOTE — ED Notes (Signed)
Report attempted 

## 2014-06-03 NOTE — ED Notes (Signed)
Per [facility], pt stood up to open the door, passed out, fell backward and hit his head. Pt taking lovenox injections. Pt hit head on hardwood floor. Per ems, person at the door stated he had "seizure like activity". Upon [facility] arrival, pt alert. Pt has no recollection of anything that happened. Doesn't remember standing up, remembers only being in the ambulance. Pt is AAOX4. LAceration to back of head. Per EMS pt asking repetative questions. C collar and LSB.

## 2014-06-03 NOTE — ED Notes (Signed)
Dr. Colin Rhein aware of pt trending BP

## 2014-06-03 NOTE — ED Notes (Signed)
Pt returned from xray

## 2014-06-03 NOTE — H&P (Signed)
Hospitalist Admission History and Physical  Patient name: Justin Patel Medical record number: [id number] Date of birth: June 15, 1951 Age: 63 y.o. Gender: male  Primary Care Provider: Rica Mast, MD  Chief Complaint: syncope, fall, SDH  History of Present Illness:This is a 63 y.o. year old male with significant past medical history of recent L TKR recently on lovenox, type 2 DM, poorly controlled HTN, anemia, morbid obesity s/p gastric bypass, hx/o GIB, CAD s/p cardiac catheterization presenting with syncope, fall, SDH. Pt opened the door to receive a ? Package, when pt had witnessed syncopal episode where he fell, striking the back of his head. Per report, pt had some generalized convulsions after fall. EMS was called. Pt had no recollection of what happened. Pt minimally aware of EMS transport.  On arrival to ER, tmax 99.8, HR 60s-90s, resp 10s-30s, BP 120s-180s/80s-80s, Satting > 97% on RA.  WBC 9.7, hgb 10.7, Cr 1.12, INR 0.99. CXR WNL. Head  CT + subdural hematoma and intraparanchemal hemorrhage in R frontal lobe. Trauma consulted with recommendation for medical admission. Per EDP, NS aware of case. Recommends repeat head CT in am.  Pt reports remote hx/o seizure d/o assd with GIB in the past. No bowel or bladder incontinence. Had TKR approx 3 weeks ago in [place]. Last lovenox dose was 2 days ago. Denies any recent illnesses and infections. No fevers or chills. No CP, SOB. Currently alert, oriented x3.   Assessment and Plan: Justin Patel is a 63 y.o. year old male presenting with fall, SDH, IPH, syncope  Active Problems:   SDH (subdural hematoma)   Syncope   Fall   1- Syncope  -Will need to rule out neurocardiogenic sources of sxs -2D ECHO, MRI  -noted ? Seizure activity- neuro consult  -follow up recs  -telemetry bed  -follow closely  2- SDH/IPH -trauma and NS aware of case per EDP  -will need repeat head CT in am  -HOLD anticoagulation -f/u NS recs   3-  HTN -poorly controlled  - would like to keep BP at/near baseline presentation levels given above -cont home regimen   4- DM -SSI, A1C  5- Anemia  -hgb 10.7 on presentation -follow closely  -denies any recent issues with GI bleeding -HOLD anticoagulation  6- CAD -no active CP  -cycle CEs   FEN/GI: heart healthy diet, PPI  Prophylaxis: SCDs  Disposition: pending further evaluation Code Status:Full Code    Patient Active Problem List   Diagnosis Date Noted  . SDH (subdural hematoma) 06/03/2014  . Chest wall pain 02/18/2014  . Left knee pain 02/04/2014  . Enlarged prostate 11/08/2013  . Anemia 11/08/2013  . Hypovitaminosis D 10/11/2013  . Fracture of metatarsal bone of right foot 10/11/2013  . Urinary hesitancy 08/27/2013  . Renal cyst 08/27/2013  . Upper GI bleed 08/20/2013  . Type II or unspecified type diabetes mellitus without mention of complication, not stated as uncontrolled 03/21/2013  . Chronic low back pain 03/21/2013  . Essential hypertension, benign 03/21/2013  . Other and unspecified hyperlipidemia 03/21/2013  . Obesity, unspecified 03/21/2013   Past Medical History: Past Medical History  Diagnosis Date  . Hypertension   . Anxiety   . Sleep apnea     hx not now since wt loss  . Diabetes mellitus   . Stones in the urinary tract   . GERD (gastroesophageal reflux disease)   . H/O hiatal hernia   . Headache(784.0)   . Arthritis   . 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 Gastric ulcer   . Degenerative disc disease   . Sacral fracture, closed     Past Surgical History: Past Surgical History  Procedure Laterality Date  . Cardiac catheterization      [facility], normal  . Hernia repair  2000    hiatal  . Gastric bypass  2010    [facility]  . Knee arthroscopy      bilateral, left x 2  . Osteotomy  2001    left  . Tonsillectomy    . Appendectomy    . Nasal sinus surgery      x5  . Uvulopalatoplasty  2011  . Anterior  cervical decomp/discectomy fusion  01/26/2012    Procedure: ANTERIOR CERVICAL DECOMPRESSION/DISCECTOMY FUSION 3 LEVELS;  Surgeon: Justin Stakes, MD;  Location: [facility];  Service: Neurosurgery;  Laterality: N/A;  Cervical three-four Cervical four-five Cervical five-six Cervical six-seven , Anterior cervical decompression/diskectomy, fusion, plate    Social History: History   Social History  . Marital Status: Married    Spouse Name: N/A    Number of Children: N/A  . Years of Education: N/A   Social History Main Topics  . Smoking status: Never Smoker   . Smokeless tobacco: None  . Alcohol Use: 0.6 oz/week    1 Glasses of wine per week     Comment: occasional  . Drug Use: No  . Sexual Activity: None   Other Topics Concern  . None   Social History Narrative   Lives in [place] with wife, Justin Patel. 2 sons, Justin Patel, Justin Patel 24.. 4 grandchildren      Work - Retired, previously taught music in [place] - regular diet, limited quantities after gastric bypass      Exercise - no regular, limited by arthritis in knees, occasional water aerobics    Family History: Family History  Problem Relation Age of Onset  . Dementia Justin Patel 89  . Heart disease Justin Patel 69  . Diabetes Justin Patel   . Cancer Patel     lymphoma, stage 4  . Cancer Patel 18    Ovarian  . Justin Patel     Allergies: Allergies  Allergen Reactions  . Altace [Ramipril] Anaphylaxis  . Levaquin [Levofloxacin In D5w] Hives  . Iohexol Hives     Desc: HIVES     Current Facility-Administered Medications  Medication Dose Route Frequency Provider Last Rate Last Dose  . 0.9 %  sodium chloride infusion   Intravenous Continuous Justin Howells, MD      . sodium chloride 0.9 % injection 3 mL  3 mL Intravenous Q12H Justin Howells, MD       Current Outpatient Prescriptions  Medication Sig Dispense Refill  . acetaminophen (TYLENOL) 500 MG tablet Take 1,000 mg by mouth every 6 (six) hours as  needed.      . CRESTOR 10 MG tablet take 1 tablet by mouth once daily  30 tablet  5  . DULoxetine (CYMBALTA) 60 MG capsule take 1 capsule by mouth once daily  30 capsule  3  . hydrALAZINE (APRESOLINE) 50 MG tablet Take 50 mg by mouth 2 (two) times daily.      Marland Kitchen losartan (COZAAR) 100 MG tablet take 1 tablet by mouth once daily  30 tablet  5  . metoprolol (TOPROL-XL) 100 MG 24 hr tablet Take 1 tablet (100 mg total) by mouth daily.  30 tablet  5  .  mupirocin ointment (BACTROBAN) 2 % Place 1 application into the nose 2 (two) times daily.      Marland Kitchen NEXIUM 40 MG capsule take 1 capsule by mouth once daily  30 capsule  10  . sucralfate (CARAFATE) 1 G tablet Take 1 tablet (1 g total) by mouth 4 (four) times daily.  120 tablet  11  . tamsulosin (FLOMAX) 0.4 MG CAPS capsule Take 0.4 mg by mouth daily.      . traMADol (ULTRAM) 50 MG tablet Take 2 tablets (100 mg total) by mouth every 8 (eight) hours as needed for severe pain.  180 tablet  1  . VITAMIN D, CHOLECALCIFEROL, PO Take 1,000 mg by mouth.        Review Of Systems: 12 point ROS negative except as noted above in HPI.  Physical Exam: Filed Vitals:   06/03/14 2202  BP: 162/87  Pulse: 91  Temp:   Resp: 18    General: alert and cooperative HEENT: PERRLA, extra ocular movement intact and post abrasions on head, staples inplace  Heart: S1, S2 normal, no murmur, rub or gallop, regular rate and rhythm Lungs: clear to auscultation, no wheezes or rales and unlabored breathing Abdomen: abdomen is soft without significant tenderness, masses, organomegaly or guarding Extremities: extremities normal, atraumatic, no cyanosis or edema Skin:no rashes Neurology: normal without focal findings  Labs and Imaging: Lab Results  Component Value Date/Time   NA 138 06/03/2014  3:28 PM   K 4.0 06/03/2014  3:28 PM   CL 104 06/03/2014  3:28 PM   CO2 19 06/03/2014  3:28 PM   BUN 17 06/03/2014  3:28 PM   CREATININE 1.12 06/03/2014  3:28 PM   GLUCOSE 83 06/03/2014   3:28 PM   Lab Results  Component Value Date   WBC 9.7 06/03/2014   HGB 10.7* 06/03/2014   HCT 32.7* 06/03/2014   MCV 100.3* 06/03/2014   PLT 371 06/03/2014    Dg Chest 2 View  06/03/2014   CLINICAL DATA:  Loss of consciousness after fall.  EXAM: CHEST  2 VIEW  COMPARISON:  January 25, 2012.  FINDINGS: The heart size and mediastinal contours are within normal limits. Both lungs are clear. No pneumothorax or pleural effusion is noted. The visualized skeletal structures are unremarkable.  IMPRESSION: No acute cardiopulmonary abnormality seen.   Electronically Signed   By: Sabino Dick M.D.   On: 06/03/2014 22:03   Ct Head Wo Contrast  06/03/2014   CLINICAL DATA:  Patient stood up to open a door, then passed out and fell backwards hitting his head. Patient is taking Lovenox injections. Patient hit his head on a hardwood floor. There was reported seizure-like activity following the fall. Patient has a posterior scalp laceration.  EXAM: CT HEAD WITHOUT CONTRAST  CT CERVICAL SPINE WITHOUT CONTRAST  TECHNIQUE: Multidetector CT imaging of the head and cervical spine was performed following the standard protocol without intravenous contrast. Multiplanar CT image reconstructions of the cervical spine were also generated.  COMPARISON:  Cervical CT, 12/30/2011.  No prior head CT.  FINDINGS: CT HEAD FINDINGS  A small amount of acute hemorrhage extends along the interhemispheric fissure above the corpus callosum, most likely subdural in location. Small amount of adjacent parenchymal hemorrhage is seen in the medial right frontal lobe extending over an area of approximately 13 mm x 13 mm.  No other intracranial hemorrhage.  There are no parenchymal masses or mass effect. The ventricles are normal in configuration. There is age related ventricular  and sulcal enlargement. No hydrocephalus.  There is no evidence of an ischemic infarct.  No extra-axial masses.  No skull fracture. There are changes from previous sinus  surgery. Minor mucosal thickening lines remaining ethmoid air cells. Mastoid air cells are clear.  A small amount of air lies in the right posterior scalp soft tissues with a small amount of associated hemorrhage reflecting the patient's laceration. No radiopaque foreign body.  CT CERVICAL SPINE FINDINGS  There has been a previous long anterior cervical spine fusion from C3 through C7. Anterior fusion plate and fixation screws are well-seated. There is mature bone graft material across the C3-C4 through C6-C7 disc spaces.  There is a grade 1 anterolisthesis of C7 on T1. Moderate loss of disc height is noted at this level. There are disc degenerative changes bilaterally also at this level. This is a chronic finding.  There is no fracture or acute finding.  End plate productive change at C5-C6 and C6-C7 indents the ventral thecal sac causing at least mild central stenosis. There are multiple levels of neural foraminal narrowing, greatest bilaterally at C6-C7 severe on the right and moderate on the left.  Bones are demineralized.  Surrounding soft tissues are unremarkable.  Lung apices are clear.  IMPRESSION: HEAD CT: There is a small amount of extra-axial intracranial hemorrhage along the interhemispheric fissure adjacent to the corpus callosum, likely subdural. There is also a small amount of adjacent parenchymal hemorrhage in the medial right frontal lobe measuring 13 mm size.  No other intracranial hemorrhage.  No skull fracture.  CERVICAL CT: No fracture or acute finding. No evidence of disruption of the anterior fusion hardware.   Electronically Signed   By: Lajean Manes M.D.   On: 06/03/2014 16:00   Ct Cervical Spine Wo Contrast  06/03/2014   CLINICAL DATA:  Patient stood up to open a door, then passed out and fell backwards hitting his head. Patient is taking Lovenox injections. Patient hit his head on a hardwood floor. There was reported seizure-like activity following the fall. Patient has a posterior  scalp laceration.  EXAM: CT HEAD WITHOUT CONTRAST  CT CERVICAL SPINE WITHOUT CONTRAST  TECHNIQUE: Multidetector CT imaging of the head and cervical spine was performed following the standard protocol without intravenous contrast. Multiplanar CT image reconstructions of the cervical spine were also generated.  COMPARISON:  Cervical CT, 12/30/2011.  No prior head CT.  FINDINGS: CT HEAD FINDINGS  A small amount of acute hemorrhage extends along the interhemispheric fissure above the corpus callosum, most likely subdural in location. Small amount of adjacent parenchymal hemorrhage is seen in the medial right frontal lobe extending over an area of approximately 13 mm x 13 mm.  No other intracranial hemorrhage.  There are no parenchymal masses or mass effect. The ventricles are normal in configuration. There is age related ventricular and sulcal enlargement. No hydrocephalus.  There is no evidence of an ischemic infarct.  No extra-axial masses.  No skull fracture. There are changes from previous sinus surgery. Minor mucosal thickening lines remaining ethmoid air cells. Mastoid air cells are clear.  A small amount of air lies in the right posterior scalp soft tissues with a small amount of associated hemorrhage reflecting the patient's laceration. No radiopaque foreign body.  CT CERVICAL SPINE FINDINGS  There has been a previous long anterior cervical spine fusion from C3 through C7. Anterior fusion plate and fixation screws are well-seated. There is mature bone graft material across the C3-C4 through C6-C7 disc spaces.  There is a grade 1 anterolisthesis of C7 on T1. Moderate loss of disc height is noted at this level. There are disc degenerative changes bilaterally also at this level. This is a chronic finding.  There is no fracture or acute finding.  End plate productive change at C5-C6 and C6-C7 indents the ventral thecal sac causing at least mild central stenosis. There are multiple levels of neural foraminal  narrowing, greatest bilaterally at C6-C7 severe on the right and moderate on the left.  Bones are demineralized.  Surrounding soft tissues are unremarkable.  Lung apices are clear.  IMPRESSION: HEAD CT: There is a small amount of extra-axial intracranial hemorrhage along the interhemispheric fissure adjacent to the corpus callosum, likely subdural. There is also a small amount of adjacent parenchymal hemorrhage in the medial right frontal lobe measuring 13 mm size.  No other intracranial hemorrhage.  No skull fracture.  CERVICAL CT: No fracture or acute finding. No evidence of disruption of the anterior fusion hardware.   Electronically Signed   By: Lajean Manes M.D.   On: 06/03/2014 16:00           Justin Howells MD  Pager: [phone]

## 2014-06-03 NOTE — ED Notes (Signed)
Pt given three words to repeat to nurse after a short time. only able to remember 1 of 3 words.

## 2014-06-03 NOTE — Consult Note (Signed)
Reason for Consult:  Ground level fall Referring Physician: Roxas Patel is an 63 y.o. male.  HPI:  Pt is a 63 yo M 3 weeks s/p left TKA at [facility] and is on prophylactic lovenox.  He fell at home in his foyer and had a LOC.  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  He denies recent illness.  Of note, he did have a small fall 2 days ago.  He did not have any noticeable neurologic deficits from either fall.  He did not pass out the last time, so he did not seek medical attention.  He complains of headache at the site of the fall, and his chronic back pain and surgical site pain.  He has not been on narcotics in the past week; he has been on tramadol.    Past Medical History  Diagnosis Date  . Hypertension   . Anxiety   . Sleep apnea     hx not now since wt loss  . Diabetes mellitus   . Stones in the urinary tract   . GERD (gastroesophageal reflux disease)   . H/O hiatal hernia   . Headache(784.0)   . Arthritis   . 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 Gastric ulcer   . Degenerative disc disease   . Sacral fracture, closed     Past Surgical History  Procedure Laterality Date  . Cardiac catheterization      [facility], normal  . Hernia repair  2000    hiatal  . Gastric bypass  2010    [facility]  . Knee arthroscopy      bilateral, left x 2  . Osteotomy  2001    left  . Tonsillectomy    . Appendectomy    . Nasal sinus surgery      x5  . Uvulopalatoplasty  2011  . Anterior cervical decomp/discectomy fusion  01/26/2012    Procedure: ANTERIOR CERVICAL DECOMPRESSION/DISCECTOMY FUSION 3 LEVELS;  Surgeon: Floyce Stakes, MD;  Location: [facility];  Service: Neurosurgery;  Laterality: N/A;  Cervical three-four Cervical four-five Cervical five-six Cervical six-seven , Anterior cervical decompression/diskectomy, fusion, plate    Family History  Problem Relation Age of  Onset  . Dementia Mother 19  . Heart disease Father 70  . Diabetes Father   . Cancer Sister     lymphoma, stage 4  . Cancer Sister 49    Ovarian  . Lupus Sister     Social History:  reports that he has never smoked. He does not have any smokeless tobacco history on file. He reports that he drinks about .6 ounces of alcohol per week. He reports that he does not use illicit drugs.  Allergies:  Allergies  Allergen Reactions  . Altace [Ramipril] Anaphylaxis  . Levaquin [Levofloxacin In D5w] Hives  . Iohexol Hives     Desc: HIVES     Medications:  MedicationsLong-Term  Outpatient Medications [facility] Medications   New medications from outside sources are available for reconciliation   acetaminophen (TYLENOL) 500 MG tablet    CRESTOR 10 MG tablet    DULoxetine (CYMBALTA) 60 MG capsule    hydrALAZINE (APRESOLINE) 50 MG tablet    losartan (COZAAR) 100 MG tablet    metoprolol (TOPROL-XL) 100 MG 24 hr tablet    mupirocin ointment (BACTROBAN) 2 %    NEXIUM 40  MG capsule    sucralfate (CARAFATE) 1 G tablet    tamsulosin (FLOMAX) 0.4 MG CAPS capsule    traMADol (ULTRAM) 50 MG tablet    VITAMIN D, CHOLECALCIFEROL, PO      Results for orders placed during the [facility] encounter of 06/03/14 (from the past 48 hour(s))  PROTIME-INR     Status: None   Collection Time    06/03/14  3:28 PM      Result Value Ref Range   Prothrombin Time 13.1  11.6 - 15.2 seconds   INR 0.99  0.00 - 1.49  CBC WITH DIFFERENTIAL     Status: Abnormal   Collection Time    06/03/14  3:28 PM      Result Value Ref Range   WBC 9.7  4.0 - 10.5 K/uL   RBC 3.26 (*) 4.22 - 5.81 MIL/uL   Hemoglobin 10.7 (*) 13.0 - 17.0 g/dL   HCT 32.7 (*) 39.0 - 52.0 %   MCV 100.3 (*) 78.0 - 100.0 fL   MCH 32.8  26.0 - 34.0 pg   MCHC 32.7  30.0 - 36.0 g/dL   RDW 14.9  11.5 - 15.5 %   Platelets 371  150 - 400 K/uL   Neutrophils Relative % 80 (*) 43 - 77 %   Neutro Abs 7.7  1.7 - 7.7 K/uL   Lymphocytes Relative 14  12 - 46  %   Lymphs Abs 1.3  0.7 - 4.0 K/uL   Monocytes Relative 6  3 - 12 %   Monocytes Absolute 0.6  0.1 - 1.0 K/uL   Eosinophils Relative 0  0 - 5 %   Eosinophils Absolute 0.0  0.0 - 0.7 K/uL   Basophils Relative 0  0 - 1 %   Basophils Absolute 0.0  0.0 - 0.1 K/uL  BASIC METABOLIC PANEL     Status: Abnormal   Collection Time    06/03/14  3:28 PM      Result Value Ref Range   Sodium 138  137 - 147 mEq/L   Potassium 4.0  3.7 - 5.3 mEq/L   Chloride 104  96 - 112 mEq/L   CO2 19  19 - 32 mEq/L   Glucose, Bld 83  70 - 99 mg/dL   BUN 17  6 - 23 mg/dL   Creatinine, Ser 1.12  0.50 - 1.35 mg/dL   Calcium 9.4  8.4 - 10.5 mg/dL   GFR calc non Af Amer 68 (*) >90 mL/min   GFR calc Af Amer 79 (*) >90 mL/min   Comment: (NOTE)     The eGFR has been calculated using the CKD EPI equation.     This calculation has not been validated in all clinical situations.     eGFR's persistently <90 mL/min signify possible Chronic Kidney     Disease.   Anion gap 15  5 - 15  I-STAT TROPOININ, ED     Status: None   Collection Time    06/03/14  3:40 PM      Result Value Ref Range   Troponin i, poc 0.00  0.00 - 0.08 ng/mL   Comment 3            Comment: Due to the release kinetics of cTnI,     a negative result within the first hours     of the onset of symptoms does not rule out     myocardial infarction with certainty.     If myocardial infarction is  still suspected,     repeat the test at appropriate intervals.    Ct Head Wo Contrast  06/03/2014   CLINICAL DATA:  Patient stood up to open a door, then passed out and fell backwards hitting his head. Patient is taking Lovenox injections. Patient hit his head on a hardwood floor. There was reported seizure-like activity following the fall. Patient has a posterior scalp laceration.  EXAM: CT HEAD WITHOUT CONTRAST  CT CERVICAL SPINE WITHOUT CONTRAST  TECHNIQUE: Multidetector CT imaging of the head and cervical spine was performed following the standard protocol  without intravenous contrast. Multiplanar CT image reconstructions of the cervical spine were also generated.  COMPARISON:  Cervical CT, 12/30/2011.  No prior head CT.  FINDINGS: CT HEAD FINDINGS  A small amount of acute hemorrhage extends along the interhemispheric fissure above the corpus callosum, most likely subdural in location. Small amount of adjacent parenchymal hemorrhage is seen in the medial right frontal lobe extending over an area of approximately 13 mm x 13 mm.  No other intracranial hemorrhage.  There are no parenchymal masses or mass effect. The ventricles are normal in configuration. There is age related ventricular and sulcal enlargement. No hydrocephalus.  There is no evidence of an ischemic infarct.  No extra-axial masses.  No skull fracture. There are changes from previous sinus surgery. Minor mucosal thickening lines remaining ethmoid air cells. Mastoid air cells are clear.  A small amount of air lies in the right posterior scalp soft tissues with a small amount of associated hemorrhage reflecting the patient's laceration. No radiopaque foreign body.  CT CERVICAL SPINE FINDINGS  There has been a previous long anterior cervical spine fusion from C3 through C7. Anterior fusion plate and fixation screws are well-seated. There is mature bone graft material across the C3-C4 through C6-C7 disc spaces.  There is a grade 1 anterolisthesis of C7 on T1. Moderate loss of disc height is noted at this level. There are disc degenerative changes bilaterally also at this level. This is a chronic finding.  There is no fracture or acute finding.  End plate productive change at C5-C6 and C6-C7 indents the ventral thecal sac causing at least mild central stenosis. There are multiple levels of neural foraminal narrowing, greatest bilaterally at C6-C7 severe on the right and moderate on the left.  Bones are demineralized.  Surrounding soft tissues are unremarkable.  Lung apices are clear.  IMPRESSION: HEAD CT: There  is a small amount of extra-axial intracranial hemorrhage along the interhemispheric fissure adjacent to the corpus callosum, likely subdural. There is also a small amount of adjacent parenchymal hemorrhage in the medial right frontal lobe measuring 13 mm size.  No other intracranial hemorrhage.  No skull fracture.  CERVICAL CT: No fracture or acute finding. No evidence of disruption of the anterior fusion hardware.   Electronically Signed   By: Lajean Manes M.D.   On: 06/03/2014 16:00   Ct Cervical Spine Wo Contrast  06/03/2014   CLINICAL DATA:  Patient stood up to open a door, then passed out and fell backwards hitting his head. Patient is taking Lovenox injections. Patient hit his head on a hardwood floor. There was reported seizure-like activity following the fall. Patient has a posterior scalp laceration.  EXAM: CT HEAD WITHOUT CONTRAST  CT CERVICAL SPINE WITHOUT CONTRAST  TECHNIQUE: Multidetector CT imaging of the head and cervical spine was performed following the standard protocol without intravenous contrast. Multiplanar CT image reconstructions of the cervical spine were also generated.  COMPARISON:  Cervical CT, 12/30/2011.  No prior head CT.  FINDINGS: CT HEAD FINDINGS  A small amount of acute hemorrhage extends along the interhemispheric fissure above the corpus callosum, most likely subdural in location. Small amount of adjacent parenchymal hemorrhage is seen in the medial right frontal lobe extending over an area of approximately 13 mm x 13 mm.  No other intracranial hemorrhage.  There are no parenchymal masses or mass effect. The ventricles are normal in configuration. There is age related ventricular and sulcal enlargement. No hydrocephalus.  There is no evidence of an ischemic infarct.  No extra-axial masses.  No skull fracture. There are changes from previous sinus surgery. Minor mucosal thickening lines remaining ethmoid air cells. Mastoid air cells are clear.  A small amount of air lies in  the right posterior scalp soft tissues with a small amount of associated hemorrhage reflecting the patient's laceration. No radiopaque foreign body.  CT CERVICAL SPINE FINDINGS  There has been a previous long anterior cervical spine fusion from C3 through C7. Anterior fusion plate and fixation screws are well-seated. There is mature bone graft material across the C3-C4 through C6-C7 disc spaces.  There is a grade 1 anterolisthesis of C7 on T1. Moderate loss of disc height is noted at this level. There are disc degenerative changes bilaterally also at this level. This is a chronic finding.  There is no fracture or acute finding.  End plate productive change at C5-C6 and C6-C7 indents the ventral thecal sac causing at least mild central stenosis. There are multiple levels of neural foraminal narrowing, greatest bilaterally at C6-C7 severe on the right and moderate on the left.  Bones are demineralized.  Surrounding soft tissues are unremarkable.  Lung apices are clear.  IMPRESSION: HEAD CT: There is a small amount of extra-axial intracranial hemorrhage along the interhemispheric fissure adjacent to the corpus callosum, likely subdural. There is also a small amount of adjacent parenchymal hemorrhage in the medial right frontal lobe measuring 13 mm size.  No other intracranial hemorrhage.  No skull fracture.  CERVICAL CT: No fracture or acute finding. No evidence of disruption of the anterior fusion hardware.   Electronically Signed   By: Lajean Manes M.D.   On: 06/03/2014 16:00    Review of Systems  Constitutional: Negative.   Neurological: Positive for loss of consciousness. Negative for dizziness, sensory change, speech change, focal weakness and seizures.  Endo/Heme/Allergies:       On prophylactic lovenox s/p left TKA  Psychiatric/Behavioral: Negative.    Blood pressure 172/83, pulse 84, temperature 99.8 F (37.7 C), temperature source Oral, resp. rate 18, SpO2 97.00%. Physical Exam  Constitutional:  He is oriented to person, place, and time. He appears well-developed and well-nourished. No distress.  HENT:  Head: Normocephalic.    Right Ear: External ear normal.  Left Ear: External ear normal.  3 cm lac with staples.  Eyes: Conjunctivae are normal. Pupils are equal, round, and reactive to light. Right eye exhibits no discharge. Left eye exhibits no discharge. No scleral icterus.  Neck: Normal range of motion. Neck supple. No JVD present. No tracheal deviation present. No thyromegaly present.  Cardiovascular: Normal rate and intact distal pulses.   Respiratory: Effort normal. No stridor. No respiratory distress.  GI: Soft. He exhibits no distension and no mass. There is no tenderness. There is no rebound and no guarding.  Musculoskeletal: Normal range of motion.  Lymphadenopathy:    He has no cervical adenopathy.  Neurological: He is alert  and oriented to person, place, and time.  Skin: Skin is warm and dry. No rash noted. He is not diaphoretic. No erythema. No pallor.  Psychiatric: He has a normal mood and affect. His behavior is normal. Judgment and thought content normal.    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  Recommend medical admit with neurosurg consult.  I suspect they will want another head CT tomorrow AM.   Recommend neuro checks.    [facility] 06/03/2014, 9:08 PM

## 2014-06-04 ENCOUNTER — Encounter (HOSPITAL_COMMUNITY): Payer: Self-pay | Admitting: *Deleted

## 2014-06-04 ENCOUNTER — Observation Stay (HOSPITAL_COMMUNITY): Payer: BC Managed Care – PPO

## 2014-06-04 DIAGNOSIS — S065X9A Traumatic subdural hemorrhage with loss of consciousness of unspecified duration, initial encounter: Secondary | ICD-10-CM | POA: Diagnosis present

## 2014-06-04 DIAGNOSIS — G8929 Other chronic pain: Secondary | ICD-10-CM | POA: Diagnosis present

## 2014-06-04 DIAGNOSIS — M545 Low back pain: Secondary | ICD-10-CM | POA: Diagnosis present

## 2014-06-04 DIAGNOSIS — K219 Gastro-esophageal reflux disease without esophagitis: Secondary | ICD-10-CM | POA: Diagnosis present

## 2014-06-04 DIAGNOSIS — S06349A Traumatic hemorrhage of right cerebrum with loss of consciousness of unspecified duration, initial encounter: Secondary | ICD-10-CM | POA: Diagnosis present

## 2014-06-04 DIAGNOSIS — R569 Unspecified convulsions: Secondary | ICD-10-CM

## 2014-06-04 DIAGNOSIS — I10 Essential (primary) hypertension: Secondary | ICD-10-CM | POA: Diagnosis present

## 2014-06-04 DIAGNOSIS — I2510 Atherosclerotic heart disease of native coronary artery without angina pectoris: Secondary | ICD-10-CM | POA: Diagnosis present

## 2014-06-04 DIAGNOSIS — W19XXXA Unspecified fall, initial encounter: Secondary | ICD-10-CM | POA: Diagnosis present

## 2014-06-04 DIAGNOSIS — S0101XA Laceration without foreign body of scalp, initial encounter: Secondary | ICD-10-CM | POA: Diagnosis present

## 2014-06-04 DIAGNOSIS — D649 Anemia, unspecified: Secondary | ICD-10-CM | POA: Diagnosis present

## 2014-06-04 DIAGNOSIS — N400 Enlarged prostate without lower urinary tract symptoms: Secondary | ICD-10-CM | POA: Diagnosis present

## 2014-06-04 DIAGNOSIS — Z9884 Bariatric surgery status: Secondary | ICD-10-CM | POA: Diagnosis not present

## 2014-06-04 DIAGNOSIS — M1990 Unspecified osteoarthritis, unspecified site: Secondary | ICD-10-CM | POA: Diagnosis present

## 2014-06-04 DIAGNOSIS — G40909 Epilepsy, unspecified, not intractable, without status epilepticus: Secondary | ICD-10-CM | POA: Diagnosis present

## 2014-06-04 DIAGNOSIS — R269 Unspecified abnormalities of gait and mobility: Secondary | ICD-10-CM | POA: Diagnosis present

## 2014-06-04 DIAGNOSIS — I619 Nontraumatic intracerebral hemorrhage, unspecified: Secondary | ICD-10-CM | POA: Insufficient documentation

## 2014-06-04 DIAGNOSIS — E119 Type 2 diabetes mellitus without complications: Secondary | ICD-10-CM | POA: Diagnosis present

## 2014-06-04 DIAGNOSIS — Z833 Family history of diabetes mellitus: Secondary | ICD-10-CM | POA: Diagnosis not present

## 2014-06-04 DIAGNOSIS — I6200 Nontraumatic subdural hemorrhage, unspecified: Secondary | ICD-10-CM

## 2014-06-04 DIAGNOSIS — R42 Dizziness and giddiness: Secondary | ICD-10-CM | POA: Diagnosis present

## 2014-06-04 DIAGNOSIS — I517 Cardiomegaly: Secondary | ICD-10-CM

## 2014-06-04 DIAGNOSIS — T1490 Injury, unspecified: Secondary | ICD-10-CM | POA: Diagnosis present

## 2014-06-04 DIAGNOSIS — R55 Syncope and collapse: Secondary | ICD-10-CM

## 2014-06-04 DIAGNOSIS — Y92009 Unspecified place in unspecified non-institutional (private) residence as the place of occurrence of the external cause: Secondary | ICD-10-CM | POA: Diagnosis not present

## 2014-06-04 LAB — CBC WITH DIFFERENTIAL/PLATELET
BASOS PCT: 0 % (ref 0–1)
Basophils Absolute: 0 10*3/uL (ref 0.0–0.1)
Eosinophils Absolute: 0 10*3/uL (ref 0.0–0.7)
Eosinophils Relative: 0 % (ref 0–5)
HCT: 29.5 % — ABNORMAL LOW (ref 39.0–52.0)
Hemoglobin: 9.6 g/dL — ABNORMAL LOW (ref 13.0–17.0)
Lymphocytes Relative: 17 % (ref 12–46)
Lymphs Abs: 1.4 10*3/uL (ref 0.7–4.0)
MCH: 31.8 pg (ref 26.0–34.0)
MCHC: 32.5 g/dL (ref 30.0–36.0)
MCV: 97.7 fL (ref 78.0–100.0)
MONOS PCT: 9 % (ref 3–12)
Monocytes Absolute: 0.8 10*3/uL (ref 0.1–1.0)
NEUTROS ABS: 6.2 10*3/uL (ref 1.7–7.7)
NEUTROS PCT: 74 % (ref 43–77)
PLATELETS: 333 10*3/uL (ref 150–400)
RBC: 3.02 MIL/uL — AB (ref 4.22–5.81)
RDW: 15.1 % (ref 11.5–15.5)
WBC: 8.5 10*3/uL (ref 4.0–10.5)

## 2014-06-04 LAB — COMPREHENSIVE METABOLIC PANEL
ALT: 7 U/L (ref 0–53)
ANION GAP: 12 (ref 5–15)
AST: 9 U/L (ref 0–37)
Albumin: 3 g/dL — ABNORMAL LOW (ref 3.5–5.2)
Alkaline Phosphatase: 68 U/L (ref 39–117)
BUN: 23 mg/dL (ref 6–23)
CALCIUM: 9.2 mg/dL (ref 8.4–10.5)
CO2: 22 mEq/L (ref 19–32)
CREATININE: 1.09 mg/dL (ref 0.50–1.35)
Chloride: 106 mEq/L (ref 96–112)
GFR, EST AFRICAN AMERICAN: 82 mL/min — AB (ref >90)
GFR, EST NON AFRICAN AMERICAN: 70 mL/min — AB (ref >90)
GLUCOSE: 101 mg/dL — AB (ref 70–99)
Potassium: 4.5 mEq/L (ref 3.7–5.3)
SODIUM: 140 meq/L (ref 137–147)
TOTAL PROTEIN: 6.2 g/dL (ref 6.0–8.3)
Total Bilirubin: 0.2 mg/dL — ABNORMAL LOW (ref 0.3–1.2)

## 2014-06-04 LAB — TROPONIN I

## 2014-06-04 LAB — HEMOGLOBIN A1C
Hgb A1c MFr Bld: 5.5 % (ref <5.7)
Mean Plasma Glucose: 111 mg/dL (ref <117)

## 2014-06-04 LAB — GLUCOSE, CAPILLARY
GLUCOSE-CAPILLARY: 119 mg/dL — AB (ref 70–99)
GLUCOSE-CAPILLARY: 130 mg/dL — AB (ref 70–99)
GLUCOSE-CAPILLARY: 171 mg/dL — AB (ref 70–99)
Glucose-Capillary: 99 mg/dL (ref 70–99)
Glucose-Capillary: 127 mg/dL — ABNORMAL HIGH (ref 70–99)
Glucose-Capillary: 124 mg/dL — ABNORMAL HIGH (ref 70–99)

## 2014-06-04 MED ORDER — ONDANSETRON HCL 4 MG/2ML IJ SOLN
4.0000 mg | Freq: Four times a day (QID) | INTRAMUSCULAR | Status: DC | PRN
  Administered 2014-06-04: 4 mg via INTRAVENOUS
  Filled 2014-06-04: qty 2

## 2014-06-04 MED ORDER — OXYCODONE HCL 5 MG PO TABS
5.0000 mg | ORAL_TABLET | ORAL | Status: DC | PRN
  Administered 2014-06-04 – 2014-06-07 (×6): 5 mg via ORAL
  Filled 2014-06-04 (×6): qty 1

## 2014-06-04 NOTE — Progress Notes (Signed)
S: Patient seen and evaluated overnight by neurologist, Dr Doy Mince. Currently resting comfortably  A/P: 63y/o admitted for seizure vs syncope workup. CT head shows small SDH and right frontal parenchymal hemorrhage.   -check MRI brain -EEG -hold AED at this time. Ativan prn for seizure activity -hold antiplatelet and anticoagulation -If MRI and EEG unremarkable can be discharged home with outpatient neurology follow up.   Jim Like, DO [facility] [phone]  If 7pm- 7am, please page neurology on call as listed in [facility].

## 2014-06-04 NOTE — Consult Note (Signed)
Reason for Consult:Syncope and possible seizure Referring Physician: Ernestina Patches  CC: Syncope  HPI: Justin Patel is an 63 y.o. male who was at home today expecting a window repairman.  He reports remembering getting a call from the repairman saying he was about 10 minutes away.  He does not recall anything else until getting to the ED.  It seems per the chart that he had a witnessed syncopal event, hit the back of his head and then was noted to have some body jerking.  There was no bowel or bladder incontinence.  EMS was called and the patient was brought in for evaluation.   The patient reports having had a similar event after his gastric bypass.  He had a GIB, had a low hemoglobin and had decreased responsiveness for about 4 days.  He is on no anticonvulsant therapy.    Past Medical History  Diagnosis Date  . Hypertension   . Anxiety   . Sleep apnea     hx not now since wt loss  . Diabetes mellitus   . Stones in the urinary tract   . GERD (gastroesophageal reflux disease)   . H/O hiatal hernia   . Headache(784.0)   . Arthritis   . 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 Gastric ulcer   . Degenerative disc disease   . Sacral fracture, closed     Past Surgical History  Procedure Laterality Date  . Cardiac catheterization      [facility], normal  . Hernia repair  2000    hiatal  . Gastric bypass  2010    [place]  . Knee arthroscopy      bilateral, left x 2  . Osteotomy  2001    left  . Tonsillectomy    . Appendectomy    . Nasal sinus surgery      x5  . Uvulopalatoplasty  2011  . Anterior cervical decomp/discectomy fusion  01/26/2012    Procedure: ANTERIOR CERVICAL DECOMPRESSION/DISCECTOMY FUSION 3 LEVELS;  Surgeon: Floyce Stakes, MD;  Location: [facility];  Service: Neurosurgery;  Laterality: N/A;  Cervical three-four Cervical four-five Cervical five-six Cervical six-seven , Anterior cervical decompression/diskectomy, fusion, plate    Family  History  Problem Relation Age of Onset  . Dementia Mother 19  . Heart disease Father 79  . Diabetes Father   . Cancer Sister     lymphoma, stage 4  . Cancer Sister 82    Ovarian  . Lupus Sister     Social History:  reports that he has never smoked. He does not have any smokeless tobacco history on file. He reports that he drinks about .6 ounces of alcohol per week. He reports that he does not use illicit drugs.  Allergies  Allergen Reactions  . Altace [Ramipril] Anaphylaxis  . Levaquin [Levofloxacin In D5w] Hives  . Iohexol Hives     Desc: HIVES     Medications:  I have reviewed the patient's current medications. Prior to Admission:  Prescriptions prior to admission  Medication Sig Dispense Refill  . acetaminophen (TYLENOL) 500 MG tablet Take 1,000 mg by mouth every 6 (six) hours as needed.      . CRESTOR 10 MG tablet take 1 tablet by mouth once daily  30 tablet  5  . DULoxetine (CYMBALTA) 60 MG capsule take 1 capsule by mouth once daily  30 capsule  3  . hydrALAZINE (APRESOLINE) 50 MG tablet Take 50 mg  by mouth 2 (two) times daily.      Marland Kitchen losartan (COZAAR) 100 MG tablet take 1 tablet by mouth once daily  30 tablet  5  . metoprolol (TOPROL-XL) 100 MG 24 hr tablet Take 1 tablet (100 mg total) by mouth daily.  30 tablet  5  . mupirocin ointment (BACTROBAN) 2 % Place 1 application into the nose 2 (two) times daily.      Marland Kitchen NEXIUM 40 MG capsule take 1 capsule by mouth once daily  30 capsule  10  . sucralfate (CARAFATE) 1 G tablet Take 1 tablet (1 g total) by mouth 4 (four) times daily.  120 tablet  11  . tamsulosin (FLOMAX) 0.4 MG CAPS capsule Take 0.4 mg by mouth daily.      . traMADol (ULTRAM) 50 MG tablet Take 2 tablets (100 mg total) by mouth every 8 (eight) hours as needed for severe pain.  180 tablet  1  . VITAMIN D, CHOLECALCIFEROL, PO Take 1,000 mg by mouth.        Scheduled: . hydrALAZINE  50 mg Oral BID  . insulin aspart  0-9 Units Subcutaneous 6 times per day  .  losartan  50 mg Oral Daily  . metoprolol  100 mg Oral Daily  . pantoprazole  80 mg Oral Q1200  . rosuvastatin  10 mg Oral Daily  . sodium chloride  3 mL Intravenous Q12H  . sucralfate  1 g Oral QID    ROS: History obtained from the patient  General ROS: negative for - chills, fatigue, fever, night sweats, weight gain or weight loss Psychological ROS: negative for - behavioral disorder, hallucinations, memory difficulties, mood swings or suicidal ideation Ophthalmic ROS: negative for - blurry vision, double vision, eye pain or loss of vision ENT ROS: negative for - epistaxis, nasal discharge, oral lesions, sore throat, tinnitus or vertigo Allergy and Immunology ROS: negative for - hives or itchy/watery eyes Hematological and Lymphatic ROS: negative for - bleeding problems, bruising or swollen lymph nodes Endocrine ROS: negative for - galactorrhea, hair pattern changes, polydipsia/polyuria or temperature intolerance Respiratory ROS: negative for - cough, hemoptysis, shortness of breath or wheezing Cardiovascular ROS: negative for - chest pain, dyspnea on exertion, edema or irregular heartbeat Gastrointestinal ROS: negative for - abdominal pain, diarrhea, hematemesis, nausea/vomiting or stool incontinence Genito-Urinary ROS: negative for - dysuria, hematuria, incontinence or urinary frequency/urgency Musculoskeletal ROS: knee surgery three weeks ago Neurological ROS: as noted in HPI Dermatological ROS: negative for rash and skin lesion changes  Physical Examination: Blood pressure 146/67, pulse 91, temperature 99.8 F (37.7 C), temperature source Oral, resp. rate 17, SpO2 97.00%.  Neurologic Examination Mental Status: Alert, oriented, thought content appropriate.  Speech fluent without evidence of aphasia.  Able to follow 3 step commands without difficulty. Cranial Nerves: II: Discs flat bilaterally; Visual fields grossly normal, pupils equal, round, reactive to light and  accommodation III,IV, VI: ptosis not present, extra-ocular motions intact bilaterally V,VII: smile symmetric, facial light touch sensation decreased on the left VIII: hearing normal bilaterally IX,X: gag reflex present XI: bilateral shoulder shrug XII: midline tongue extension Motor: Right : Upper extremity   5/5    Left:     Upper extremity   5/5  Lower extremity   5/5     Lower extremity   5/5 Tone and bulk:normal tone throughout; no atrophy noted Sensory: Pinprick and light touch intact with the exception of the left leg that continues to have paresthesias related to the surgery.  Deep Tendon Reflexes: 2+ and symmetric throughout with left knee jerk not tested Plantars: Right: downgoing   Left: downgoing Cerebellar: normal finger-to-nose and normal heel-to-shin testing bilaterally Gait: Unable to test secondary to restrictions CV: pulses palpable throughout     Laboratory Studies:   Basic Metabolic Panel:  Recent Labs Lab 06/03/14 1528  NA 138  K 4.0  CL 104  CO2 19  GLUCOSE 83  BUN 17  CREATININE 1.12  CALCIUM 9.4    Liver Function Tests: No results found for this basename: AST, ALT, ALKPHOS, BILITOT, PROT, ALBUMIN,  in the last 168 hours No results found for this basename: LIPASE, AMYLASE,  in the last 168 hours No results found for this basename: AMMONIA,  in the last 168 hours  CBC:  Recent Labs Lab 06/03/14 1528  WBC 9.7  NEUTROABS 7.7  HGB 10.7*  HCT 32.7*  MCV 100.3*  PLT 371    Cardiac Enzymes:  Recent Labs Lab 06/03/14 2257  TROPONINI <0.30    BNP: No components found with this basename: POCBNP,   CBG:  Recent Labs Lab 06/04/14 0059  GLUCAP 171*    Microbiology: Results for orders placed during the hospital encounter of 01/26/12  SURGICAL PCR SCREEN     Status: Abnormal   Collection Time    01/25/12  2:21 PM      Result Value Ref Range Status   MRSA, PCR NEGATIVE  NEGATIVE Final   Staphylococcus aureus POSITIVE (*)  NEGATIVE Final   Comment:            The Xpert SA Assay (FDA     approved for NASAL specimens     only), is one component of     a comprehensive surveillance     program.  It is not intended     to diagnose infection nor to     guide or monitor treatment.    Coagulation Studies:  Recent Labs  06/03/14 1528  LABPROT 13.1  INR 0.99    Urinalysis: No results found for this basename: COLORURINE, APPERANCEUR, LABSPEC, PHURINE, GLUCOSEU, HGBUR, BILIRUBINUR, KETONESUR, PROTEINUR, UROBILINOGEN, NITRITE, LEUKOCYTESUR,  in the last 168 hours  Lipid Panel:     Component Value Date/Time   CHOL 139 02/18/2014 1441   TRIG 170.0* 02/18/2014 1441   HDL 45.40 02/18/2014 1441   CHOLHDL 3 02/18/2014 1441   VLDL 34.0 02/18/2014 1441   LDLCALC 60 02/18/2014 1441    HgbA1C:  Lab Results  Component Value Date   HGBA1C 5.7 02/18/2014    Urine Drug Screen:   No results found for this basename: labopia, cocainscrnur, labbenz, amphetmu, thcu, labbarb    Alcohol Level: No results found for this basename: ETH,  in the last 168 hours  Other results: EKG: sinus rhythm at 87 bpm.  Imaging: Dg Chest 2 View  06/03/2014   CLINICAL DATA:  Loss of consciousness after fall.  EXAM: CHEST  2 VIEW  COMPARISON:  January 25, 2012.  FINDINGS: The heart size and mediastinal contours are within normal limits. Both lungs are clear. No pneumothorax or pleural effusion is noted. The visualized skeletal structures are unremarkable.  IMPRESSION: No acute cardiopulmonary abnormality seen.   Electronically Signed   By: Sabino Dick M.D.   On: 06/03/2014 22:03   Ct Head Wo Contrast  06/03/2014   CLINICAL DATA:  Patient stood up to open a door, then passed out and fell backwards hitting his head. Patient is taking Lovenox injections. Patient hit his head  on a hardwood floor. There was reported seizure-like activity following the fall. Patient has a posterior scalp laceration.  EXAM: CT HEAD WITHOUT CONTRAST  CT CERVICAL SPINE  WITHOUT CONTRAST  TECHNIQUE: Multidetector CT imaging of the head and cervical spine was performed following the standard protocol without intravenous contrast. Multiplanar CT image reconstructions of the cervical spine were also generated.  COMPARISON:  Cervical CT, 12/30/2011.  No prior head CT.  FINDINGS: CT HEAD FINDINGS  A small amount of acute hemorrhage extends along the interhemispheric fissure above the corpus callosum, most likely subdural in location. Small amount of adjacent parenchymal hemorrhage is seen in the medial right frontal lobe extending over an area of approximately 13 mm x 13 mm.  No other intracranial hemorrhage.  There are no parenchymal masses or mass effect. The ventricles are normal in configuration. There is age related ventricular and sulcal enlargement. No hydrocephalus.  There is no evidence of an ischemic infarct.  No extra-axial masses.  No skull fracture. There are changes from previous sinus surgery. Minor mucosal thickening lines remaining ethmoid air cells. Mastoid air cells are clear.  A small amount of air lies in the right posterior scalp soft tissues with a small amount of associated hemorrhage reflecting the patient's laceration. No radiopaque foreign body.  CT CERVICAL SPINE FINDINGS  There has been a previous long anterior cervical spine fusion from C3 through C7. Anterior fusion plate and fixation screws are well-seated. There is mature bone graft material across the C3-C4 through C6-C7 disc spaces.  There is a grade 1 anterolisthesis of C7 on T1. Moderate loss of disc height is noted at this level. There are disc degenerative changes bilaterally also at this level. This is a chronic finding.  There is no fracture or acute finding.  End plate productive change at C5-C6 and C6-C7 indents the ventral thecal sac causing at least mild central stenosis. There are multiple levels of neural foraminal narrowing, greatest bilaterally at C6-C7 severe on the right and moderate on  the left.  Bones are demineralized.  Surrounding soft tissues are unremarkable.  Lung apices are clear.  IMPRESSION: HEAD CT: There is a small amount of extra-axial intracranial hemorrhage along the interhemispheric fissure adjacent to the corpus callosum, likely subdural. There is also a small amount of adjacent parenchymal hemorrhage in the medial right frontal lobe measuring 13 mm size.  No other intracranial hemorrhage.  No skull fracture.  CERVICAL CT: No fracture or acute finding. No evidence of disruption of the anterior fusion hardware.   Electronically Signed   By: Lajean Manes M.D.   On: 06/03/2014 16:00   Ct Cervical Spine Wo Contrast  06/03/2014   CLINICAL DATA:  Patient stood up to open a door, then passed out and fell backwards hitting his head. Patient is taking Lovenox injections. Patient hit his head on a hardwood floor. There was reported seizure-like activity following the fall. Patient has a posterior scalp laceration.  EXAM: CT HEAD WITHOUT CONTRAST  CT CERVICAL SPINE WITHOUT CONTRAST  TECHNIQUE: Multidetector CT imaging of the head and cervical spine was performed following the standard protocol without intravenous contrast. Multiplanar CT image reconstructions of the cervical spine were also generated.  COMPARISON:  Cervical CT, 12/30/2011.  No prior head CT.  FINDINGS: CT HEAD FINDINGS  A small amount of acute hemorrhage extends along the interhemispheric fissure above the corpus callosum, most likely subdural in location. Small amount of adjacent parenchymal hemorrhage is seen in the medial right frontal lobe extending  over an area of approximately 13 mm x 13 mm.  No other intracranial hemorrhage.  There are no parenchymal masses or mass effect. The ventricles are normal in configuration. There is age related ventricular and sulcal enlargement. No hydrocephalus.  There is no evidence of an ischemic infarct.  No extra-axial masses.  No skull fracture. There are changes from previous  sinus surgery. Minor mucosal thickening lines remaining ethmoid air cells. Mastoid air cells are clear.  A small amount of air lies in the right posterior scalp soft tissues with a small amount of associated hemorrhage reflecting the patient's laceration. No radiopaque foreign body.  CT CERVICAL SPINE FINDINGS  There has been a previous long anterior cervical spine fusion from C3 through C7. Anterior fusion plate and fixation screws are well-seated. There is mature bone graft material across the C3-C4 through C6-C7 disc spaces.  There is a grade 1 anterolisthesis of C7 on T1. Moderate loss of disc height is noted at this level. There are disc degenerative changes bilaterally also at this level. This is a chronic finding.  There is no fracture or acute finding.  End plate productive change at C5-C6 and C6-C7 indents the ventral thecal sac causing at least mild central stenosis. There are multiple levels of neural foraminal narrowing, greatest bilaterally at C6-C7 severe on the right and moderate on the left.  Bones are demineralized.  Surrounding soft tissues are unremarkable.  Lung apices are clear.  IMPRESSION: HEAD CT: There is a small amount of extra-axial intracranial hemorrhage along the interhemispheric fissure adjacent to the corpus callosum, likely subdural. There is also a small amount of adjacent parenchymal hemorrhage in the medial right frontal lobe measuring 13 mm size.  No other intracranial hemorrhage.  No skull fracture.  CERVICAL CT: No fracture or acute finding. No evidence of disruption of the anterior fusion hardware.   Electronically Signed   By: Lajean Manes M.D.   On: 06/03/2014 16:00     Assessment/Plan: 63 year old male presenting after a syncopal episode with jerking noted after event.  Unclear if the jerking was related to hypoperfusion from the syncope or if this was epileptic in origin.  Patient on Ultram.  Has been on Lovenox at home.  Last dose 2 days ago.  Head CT reviewed and  shows a interhemispheric SDH with some associated right frontal lobe parenchymal hemorrhage which is small.    Recommendations: 1.  Follow up imaging.  Patient to have MR of the brain without contrast 10/27 2.  EEG 3.  Would not start anticonvulsant therapy at this time.  Ativan prn 4.  Seizure precautions 5.  No antiplatelet or anticoagulant therapy 6.  D/C Krystal Eaton, MD [facility] [phone] 06/04/2014, 1:24 AM

## 2014-06-04 NOTE — Progress Notes (Signed)
Echocardiogram 2D Echocardiogram has been performed.  Justin Patel 06/04/2014, 3:18 PM

## 2014-06-04 NOTE — Progress Notes (Signed)
EEG completed, results pending. 

## 2014-06-04 NOTE — Progress Notes (Signed)
PROGRESS NOTE  Justin Patel ZPH:[id number] DOB: 07/27/1951 DOA: 06/03/2014 PCP: Rica Mast, MD  HPI: 63 y.o. year old male with significant past medical history of recent L TKR recently on lovenox, type 2 DM, poorly controlled HTN, anemia, morbid obesity s/p gastric bypass, hx/o GIB, CAD s/p cardiac catheterization presenting with syncope, fall, SDH.  Subjective/ 24 H Interval events - endorses a headache this morning, feels lightheaded and dizzy  Active Problems:   SDH (subdural hematoma)   Syncope   Fall   Intracerebral hemorrhage   Assessment/Plan: Syncope due to CVA - MRI positive for probable ischemia - 2D ECHO, EEG pending - noted ? Seizure activity- neuro consult  - follow closely   SDH/IPH  - trauma and NS aware of case per EDP  - will need repeat head CT in am  - HOLD anticoagulation  - f/u NS recs   HTN  - poorly controlled  - would like to keep BP at/near baseline presentation levels given above  - cont home regimen   DM  -SSI, A1C   Anemia  - hgb 10.7>>9.6, monitor.   CAD  -no active CP  -cycle CEs     Diet: heart healthy/carb modified Fluids: none DVT Prophylaxis: SCD  Code Status: Full Family Communication: d/w patient  Disposition Plan: home when ready   Consultants:  Neurology  Trauma  Procedures:  None    Antibiotics  Anti-infectives   None       Studies  Dg Chest 2 View 06/03/2014 No acute cardiopulmonary abnormality seen.  Ct Head Wo Contrast 06/03/2014 HEAD CT: There is a small amount of extra-axial intracranial hemorrhage along the interhemispheric fissure adjacent to the corpus callosum, likely subdural. There is also a small amount of adjacent parenchymal hemorrhage in the medial right frontal lobe measuring 13 mm size.  No other intracranial hemorrhage.  No skull fracture.  CERVICAL CT: No fracture or acute finding. No evidence of disruption of the anterior fusion hardware.  Ct Cervical Spine Wo  Contrast 06/03/2014 HEAD CT: There is a small amount of extra-axial intracranial hemorrhage along the interhemispheric fissure adjacent to the corpus callosum, likely subdural. There is also a small amount of adjacent parenchymal hemorrhage in the medial right frontal lobe measuring 13 mm size.  No other intracranial hemorrhage.  No skull fracture.  CERVICAL CT: No fracture or acute finding. No evidence of disruption of the anterior fusion hardware. MRI brain 10/27 Medial RIGHT frontal acute parenchymal hemorrhage with adjacent subdural and subarachnoid blood consistent with a posttraumatic shearing injury. Small foci of restricted diffusion within the RIGHT cingulate gyrus and RIGHT frontal subcortical white matter representing probable associated ischemia. Atrophy and small vessel disease. No visible pericallosal artery aneurysm or parenchymal arteriovenous malformation.  Objective  Filed Vitals:   06/04/14 0124 06/04/14 0133 06/04/14 0200 06/04/14 0443  BP:   180/81 164/81  Pulse:    78  Temp:  99.3 F (37.4 C)  99.5 F (37.5 C)  TempSrc:  Oral  Oral  Resp:      Height: 5\' 7"  (1.702 m)     Weight: 78.019 kg (172 lb)     SpO2:    98%    Intake/Output Summary (Last 24 hours) at 06/04/14 1147 Last data filed at 06/04/14 1100  Gross per 24 hour  Intake    240 ml  Output      1 ml  Net    239 ml   Filed Weights   06/04/14 0124  Weight: 78.019 kg (172 lb)    Exam:  General:  NAD  Cardiovascular: RRR  Respiratory: CTA biL  Abdomen: soft, non tender  MSK: no edema  Neuro: non focal  Data Reviewed: Basic Metabolic Panel:  Recent Labs Lab 06/03/14 1528 06/04/14 0420  NA 138 140  K 4.0 4.5  CL 104 106  CO2 19 22  GLUCOSE 83 101*  BUN 17 23  CREATININE 1.12 1.09  CALCIUM 9.4 9.2   Liver Function Tests:  Recent Labs Lab 06/04/14 0420  AST 9  ALT 7  ALKPHOS 68  BILITOT 0.2*  PROT 6.2  ALBUMIN 3.0*   CBC:  Recent Labs Lab 06/03/14 1528 06/04/14 0420    WBC 9.7 8.5  NEUTROABS 7.7 6.2  HGB 10.7* 9.6*  HCT 32.7* 29.5*  MCV 100.3* 97.7  PLT 371 333   Cardiac Enzymes:  Recent Labs Lab 06/03/14 2257 06/04/14 0420  TROPONINI <0.30 <0.30   CBG:  Recent Labs Lab 06/04/14 0059 06/04/14 0440 06/04/14 0757  GLUCAP 171* 99 127*   Scheduled Meds: . hydrALAZINE  50 mg Oral BID  . insulin aspart  0-9 Units Subcutaneous 6 times per day  . losartan  50 mg Oral Daily  . metoprolol  100 mg Oral Daily  . pantoprazole  80 mg Oral Q1200  . rosuvastatin  10 mg Oral Daily  . sodium chloride  3 mL Intravenous Q12H  . sucralfate  1 g Oral QID   Continuous Infusions: . sodium chloride 75 mL/hr at 06/03/14 2254    Time spent: 35 minutes  Marzetta Board, MD [facility] Pager [phone]. If 7 PM - 7 AM, please contact night-coverage at [email or web address], password [email or web address] 06/04/2014, 11:47 AM  LOS: 1 day

## 2014-06-04 NOTE — Progress Notes (Signed)
UR completed 

## 2014-06-04 NOTE — Procedures (Signed)
History: 63 yo M with fall, SDH  Sedation:   Technique: This is a 17 channel routine scalp EEG performed at the bedside with bipolar and monopolar montages arranged in accordance to the international 10/20 system of electrode placement. One channel was dedicated to EKG recording.    Background: There is a well defined posterior dominant rhythm of 9 Hz that attenuates with eye opening. The background consists of intermixed alpha and beta activities. There is no sleep recorded.   Photic stimulation: Physiologic driving is not performed  EEG Abnormalities: none  Clinical Interpretation: This normal EEG is recorded in the waking state. There was no seizure or seizure predisposition recorded on this study.   Roland Rack, MD [facility] [phone]  If 7pm- 7am, please page neurology on call as listed in [place].

## 2014-06-04 NOTE — Progress Notes (Signed)
SLP Cancellation Note  Patient Details Name: Justin Patel MRN: [id number] DOB: [birth year]   Cancelled treatment:       Reason Eval/Treat Not Completed: Patient at procedure or test/unavailable.  SLP will follow up as able.  Gunnar Fusi, M.A., CCC-SLP [phone]  [place] 06/04/2014, 3:01 PM

## 2014-06-04 NOTE — Progress Notes (Signed)
Patient ID: Justin Patel, male   DOB: [birth year], 63 y.o.   MRN: [id number]    Subjective: HA, no other neuro complaints  Objective: Vital signs in last 24 hours: Temp:  [98.7 F (37.1 C)-99.8 F (37.7 C)] 99.5 F (37.5 C) (10/27 0443) Pulse Rate:  [67-99] 78 (10/27 0443) Resp:  [12-36] 17 (10/26 2315) BP: (123-187)/(67-104) 164/81 mmHg (10/27 0443) SpO2:  [97 %-100 %] 98 % (10/27 0443) Weight:  [172 lb (78.019 kg)] 172 lb (78.019 kg) (10/27 0124) Last BM Date: 06/03/14  Intake/Output from previous day:   Intake/Output this shift:    Head: small lac with staples R occipital Resp: clear to auscultation bilaterally Cardio: regular rate and rhythm GI: soft, NT Neuro: A&O, F/C, MAE  Lab Results: CBC   Recent Labs  06/03/14 1528 06/04/14 0420  WBC 9.7 8.5  HGB 10.7* 9.6*  HCT 32.7* 29.5*  PLT 371 333   BMET  Recent Labs  06/03/14 1528 06/04/14 0420  NA 138 140  K 4.0 4.5  CL 104 106  CO2 19 22  GLUCOSE 83 101*  BUN 17 23  CREATININE 1.12 1.09  CALCIUM 9.4 9.2   PT/INR  Recent Labs  06/03/14 1528  LABPROT 13.1  INR 0.99   ABG No results found for this basename: PHART, PCO2, PO2, HCO3,  in the last 72 hours  Studies/Results: Dg Chest 2 View  06/03/2014   CLINICAL DATA:  Loss of consciousness after fall.  EXAM: CHEST  2 VIEW  COMPARISON:  January 25, 2012.  FINDINGS: The heart size and mediastinal contours are within normal limits. Both lungs are clear. No pneumothorax or pleural effusion is noted. The visualized skeletal structures are unremarkable.  IMPRESSION: No acute cardiopulmonary abnormality seen.   Electronically Signed   By: Sabino Dick M.D.   On: 06/03/2014 22:03   Ct Head Wo Contrast  06/03/2014   CLINICAL DATA:  Patient stood up to open a door, then passed out and fell backwards hitting his head. Patient is taking Lovenox injections. Patient hit his head on a hardwood floor. There was reported seizure-like activity following the fall.  Patient has a posterior scalp laceration.  EXAM: CT HEAD WITHOUT CONTRAST  CT CERVICAL SPINE WITHOUT CONTRAST  TECHNIQUE: Multidetector CT imaging of the head and cervical spine was performed following the standard protocol without intravenous contrast. Multiplanar CT image reconstructions of the cervical spine were also generated.  COMPARISON:  Cervical CT, 12/30/2011.  No prior head CT.  FINDINGS: CT HEAD FINDINGS  A small amount of acute hemorrhage extends along the interhemispheric fissure above the corpus callosum, most likely subdural in location. Small amount of adjacent parenchymal hemorrhage is seen in the medial right frontal lobe extending over an area of approximately 13 mm x 13 mm.  No other intracranial hemorrhage.  There are no parenchymal masses or mass effect. The ventricles are normal in configuration. There is age related ventricular and sulcal enlargement. No hydrocephalus.  There is no evidence of an ischemic infarct.  No extra-axial masses.  No skull fracture. There are changes from previous sinus surgery. Minor mucosal thickening lines remaining ethmoid air cells. Mastoid air cells are clear.  A small amount of air lies in the right posterior scalp soft tissues with a small amount of associated hemorrhage reflecting the patient's laceration. No radiopaque foreign body.  CT CERVICAL SPINE FINDINGS  There has been a previous long anterior cervical spine fusion from C3 through C7. Anterior fusion plate and fixation  screws are well-seated. There is mature bone graft material across the C3-C4 through C6-C7 disc spaces.  There is a grade 1 anterolisthesis of C7 on T1. Moderate loss of disc height is noted at this level. There are disc degenerative changes bilaterally also at this level. This is a chronic finding.  There is no fracture or acute finding.  End plate productive change at C5-C6 and C6-C7 indents the ventral thecal sac causing at least mild central stenosis. There are multiple levels of  neural foraminal narrowing, greatest bilaterally at C6-C7 severe on the right and moderate on the left.  Bones are demineralized.  Surrounding soft tissues are unremarkable.  Lung apices are clear.  IMPRESSION: HEAD CT: There is a small amount of extra-axial intracranial hemorrhage along the interhemispheric fissure adjacent to the corpus callosum, likely subdural. There is also a small amount of adjacent parenchymal hemorrhage in the medial right frontal lobe measuring 13 mm size.  No other intracranial hemorrhage.  No skull fracture.  CERVICAL CT: No fracture or acute finding. No evidence of disruption of the anterior fusion hardware.   Electronically Signed   By: Lajean Manes M.D.   On: 06/03/2014 16:00   Ct Cervical Spine Wo Contrast  06/03/2014   CLINICAL DATA:  Patient stood up to open a door, then passed out and fell backwards hitting his head. Patient is taking Lovenox injections. Patient hit his head on a hardwood floor. There was reported seizure-like activity following the fall. Patient has a posterior scalp laceration.  EXAM: CT HEAD WITHOUT CONTRAST  CT CERVICAL SPINE WITHOUT CONTRAST  TECHNIQUE: Multidetector CT imaging of the head and cervical spine was performed following the standard protocol without intravenous contrast. Multiplanar CT image reconstructions of the cervical spine were also generated.  COMPARISON:  Cervical CT, 12/30/2011.  No prior head CT.  FINDINGS: CT HEAD FINDINGS  A small amount of acute hemorrhage extends along the interhemispheric fissure above the corpus callosum, most likely subdural in location. Small amount of adjacent parenchymal hemorrhage is seen in the medial right frontal lobe extending over an area of approximately 13 mm x 13 mm.  No other intracranial hemorrhage.  There are no parenchymal masses or mass effect. The ventricles are normal in configuration. There is age related ventricular and sulcal enlargement. No hydrocephalus.  There is no evidence of an  ischemic infarct.  No extra-axial masses.  No skull fracture. There are changes from previous sinus surgery. Minor mucosal thickening lines remaining ethmoid air cells. Mastoid air cells are clear.  A small amount of air lies in the right posterior scalp soft tissues with a small amount of associated hemorrhage reflecting the patient's laceration. No radiopaque foreign body.  CT CERVICAL SPINE FINDINGS  There has been a previous long anterior cervical spine fusion from C3 through C7. Anterior fusion plate and fixation screws are well-seated. There is mature bone graft material across the C3-C4 through C6-C7 disc spaces.  There is a grade 1 anterolisthesis of C7 on T1. Moderate loss of disc height is noted at this level. There are disc degenerative changes bilaterally also at this level. This is a chronic finding.  There is no fracture or acute finding.  End plate productive change at C5-C6 and C6-C7 indents the ventral thecal sac causing at least mild central stenosis. There are multiple levels of neural foraminal narrowing, greatest bilaterally at C6-C7 severe on the right and moderate on the left.  Bones are demineralized.  Surrounding soft tissues are unremarkable.  Lung  apices are clear.  IMPRESSION: HEAD CT: There is a small amount of extra-axial intracranial hemorrhage along the interhemispheric fissure adjacent to the corpus callosum, likely subdural. There is also a small amount of adjacent parenchymal hemorrhage in the medial right frontal lobe measuring 13 mm size.  No other intracranial hemorrhage.  No skull fracture.  CERVICAL CT: No fracture or acute finding. No evidence of disruption of the anterior fusion hardware.   Electronically Signed   By: Lajean Manes M.D.   On: 06/03/2014 16:00    Anti-infectives: Anti-infectives   None      Assessment/Plan: Fall SDH - neurology is following and MR pending, also doing syncope eval Scalp lac - staples may need to be removed for MR. If so, rec  replace with dermabond   LOS: 1 day    Georganna Skeans, MD, MPH, FACS Trauma: [phone] General Surgery: [phone]  06/04/2014

## 2014-06-04 NOTE — Progress Notes (Signed)
Pt admitted from ED accompanied by wife,alert and oriented but c/o of slight headache,pt settled in bed, call light at bedside, will continue to monitor. Obasogie-Asidi, Lillah Standre Efe

## 2014-06-05 ENCOUNTER — Inpatient Hospital Stay (HOSPITAL_COMMUNITY): Payer: BC Managed Care – PPO

## 2014-06-05 LAB — BASIC METABOLIC PANEL
Anion gap: 11 (ref 5–15)
BUN: 13 mg/dL (ref 6–23)
CALCIUM: 9 mg/dL (ref 8.4–10.5)
CO2: 23 meq/L (ref 19–32)
Chloride: 103 mEq/L (ref 96–112)
Creatinine, Ser: 0.94 mg/dL (ref 0.50–1.35)
GFR calc Af Amer: >90 mL/min (ref >90)
GFR calc non Af Amer: 87 mL/min — ABNORMAL LOW (ref >90)
GLUCOSE: 122 mg/dL — AB (ref 70–99)
Potassium: 4.1 mEq/L (ref 3.7–5.3)
SODIUM: 137 meq/L (ref 137–147)

## 2014-06-05 LAB — CBC
HEMATOCRIT: 29.1 % — AB (ref 39.0–52.0)
HEMOGLOBIN: 9.6 g/dL — AB (ref 13.0–17.0)
MCH: 32.5 pg (ref 26.0–34.0)
MCHC: 33 g/dL (ref 30.0–36.0)
MCV: 98.6 fL (ref 78.0–100.0)
Platelets: 262 10*3/uL (ref 150–400)
RBC: 2.95 MIL/uL — AB (ref 4.22–5.81)
RDW: 14.8 % (ref 11.5–15.5)
WBC: 7.3 10*3/uL (ref 4.0–10.5)

## 2014-06-05 LAB — GLUCOSE, CAPILLARY
GLUCOSE-CAPILLARY: 89 mg/dL (ref 70–99)
GLUCOSE-CAPILLARY: 90 mg/dL (ref 70–99)
Glucose-Capillary: 103 mg/dL — ABNORMAL HIGH (ref 70–99)
Glucose-Capillary: 118 mg/dL — ABNORMAL HIGH (ref 70–99)
Glucose-Capillary: 95 mg/dL (ref 70–99)
Glucose-Capillary: 99 mg/dL (ref 70–99)

## 2014-06-05 MED ORDER — INSULIN ASPART 100 UNIT/ML ~~LOC~~ SOLN: 0.0000 [IU] | Freq: Three times a day (TID) | SUBCUTANEOUS | Status: DC

## 2014-06-05 NOTE — Progress Notes (Signed)
[facility] PROGRESS NOTE  Justin Patel IHW:[id number] DOB: May 21, 1951 DOA: 06/03/2014 PCP: Rica Mast, MD From prior PN: 63 y.o. year old male with significant past medical history of recent L TKR recently on lovenox, type 2 DM, poorly controlled HTN, anemia, morbid obesity s/p gastric bypass, hx/o GIB, CAD s/p cardiac catheterization presenting with syncope, fall, SDH.   Assessment/Plan: Syncope due to CVA  - MRI positive for probable ischemia  - 2D ECHO, EEG: This normal EEG is recorded in the waking state. There was no seizure or seizure predisposition recorded on this study - Neurology on board and assisting with case  SDH/IPH  - trauma and NS aware of case per EDP   - HOLD anticoagulation  - f/u NS recs   HTN  - poorly controlled  - pt on losartan, hydralazine, metoprolol. - cont  To monitor and if persistently elevated will plan on increasing antihypertensive medications.  DM  - SSI, A1C  - diabetic diet.  Anemia  - hgb 10.7>>9.6, and currently at 9.6  CAD  -no active CP reported to me by patient - CEs negative x 3   Code Status: full Family Communication: None at bedside Disposition Plan: Pending improvement in condition   Consultants:  Neurology  Procedures: - MRI completed shows  Medial RIGHT frontal acute parenchymal hemorrhage with adjacent  subdural and subarachnoid blood consistent with a posttraumatic  shearing injury.  Small foci of restricted diffusion within the RIGHT cingulate gyrus  and RIGHT frontal subcortical white matter representing probable  associated ischemia. - EEG  Antibiotics:  None   HPI/Subjective: His only complaint is dizziness with movement of his head.  Objective: Filed Vitals:   06/05/14 1406  BP: 155/90  Pulse: 69  Temp: 99.6 F (37.6 C)  Resp: 20    Intake/Output Summary (Last 24 hours) at 06/05/14 1648 Last data filed at 06/05/14 1108  Gross per 24 hour  Intake 2917.5 ml  Output    1125 ml  Net 1792.5 ml   Filed Weights   06/04/14 0124 06/05/14 0453  Weight: 78.019 kg (172 lb) 79.198 kg (174 lb 9.6 oz)    Exam:   General:  Pt in nad, alert and awake  Cardiovascular: rrr, no mrg  Respiratory: cta bl, no wheezes  Abdomen: soft, ND  Musculoskeletal: no cyanosis or clubbing   Data Reviewed: Basic Metabolic Panel:  Recent Labs Lab 06/03/14 1528 06/04/14 0420 06/05/14 0400  NA 138 140 137  K 4.0 4.5 4.1  CL 104 106 103  CO2 19 22 23   GLUCOSE 83 101* 122*  BUN 17 23 13   CREATININE 1.12 1.09 0.94  CALCIUM 9.4 9.2 9.0   Liver Function Tests:  Recent Labs Lab 06/04/14 0420  AST 9  ALT 7  ALKPHOS 68  BILITOT 0.2*  PROT 6.2  ALBUMIN 3.0*   No results found for this basename: LIPASE, AMYLASE,  in the last 168 hours No results found for this basename: AMMONIA,  in the last 168 hours CBC:  Recent Labs Lab 06/03/14 1528 06/04/14 0420 06/05/14 0400  WBC 9.7 8.5 7.3  NEUTROABS 7.7 6.2  --   HGB 10.7* 9.6* 9.6*  HCT 32.7* 29.5* 29.1*  MCV 100.3* 97.7 98.6  PLT 371 333 262   Cardiac Enzymes:  Recent Labs Lab 06/03/14 2257 06/04/14 0420 06/04/14 1300  TROPONINI <0.30 <0.30 <0.30   BNP (last 3 results) No results found for this basename: PROBNP,  in the last 8760 hours CBG:  Recent  Labs Lab 06/04/14 2026 06/05/14 06/05/14 0423 06/05/14 0751 06/05/14 1140  GLUCAP 130* 118* 89 103* 95    No results found for this or any previous visit (from the past 240 hour(s)).   Studies: Dg Chest 2 View  06/03/2014   CLINICAL DATA:  Loss of consciousness after fall.  EXAM: CHEST  2 VIEW  COMPARISON:  January 25, 2012.  FINDINGS: The heart size and mediastinal contours are within normal limits. Both lungs are clear. No pneumothorax or pleural effusion is noted. The visualized skeletal structures are unremarkable.  IMPRESSION: No acute cardiopulmonary abnormality seen.   Electronically Signed   By: Sabino Dick M.D.   On: 06/03/2014 22:03    Ct Head Wo Contrast  06/05/2014   CLINICAL DATA:  Followup subdural hematoma  EXAM: CT HEAD WITHOUT CONTRAST  TECHNIQUE: Contiguous axial images were obtained from the base of the skull through the vertex without intravenous contrast.  COMPARISON:  06/03/2014  FINDINGS: The bony calvarium is again intact. Surgical staples are noted in the right posterior parietal scalp  The previously seen subdural hematoma is again identified and has decreased slightly in size from the prior exam. Some parenchymal increased attenuation is noted consistent with hemorrhage. This is also decreased in size now measuring approximately 6 mm in size. No new areas of hemorrhage are noted.  IMPRESSION: Improvement in subdural and parenchymal hemorrhage when compared with the original exam. No new focal abnormality is seen.   Electronically Signed   By: Inez Catalina M.D.   On: 06/05/2014 11:26   Mr Brain Wo Contrast  06/04/2014   CLINICAL DATA:  Syncopal episode followed by a seizure 06/03/2014. Patient struck head on floor. Patient anticoagulated.  EXAM: MRI HEAD WITHOUT CONTRAST  TECHNIQUE: Multiplanar, multiecho pulse sequences of the brain and surrounding structures were obtained without intravenous contrast.  COMPARISON:  CT head and cervical spine 06/03/2014.  FINDINGS: There is a medial RIGHT frontal parenchymal hemorrhage with adjacent subdural and subarachnoid blood which is likely posttraumatic. I do not see evidence for pericallosal artery aneurysm or parenchymal vascular malformation. As measured on gradient sequence 8 image 132 cross-section of the parenchymal and subdural blood is 10 x 17 mm, which may be slightly increased from CT.  On diffusion-weighted imaging, there is restricted diffusion within the cingulate gyrus and more superior subcortical white matter which may represent associated ischemia within the RIGHT ACA territory. No similar changes on the LEFT.  Generalized atrophy premature for age. Mild  subcortical and periventricular T2 and FLAIR hyperintensities, likely chronic microvascular ischemic change. Tiny focus hemorrhage RIGHT parietal subcortical white matter in a linear fashion, likely remote insult. Previous cervical fusion is incompletely evaluated. No midline abnormality of the pituitary and cerebellar tonsils. Flow voids are maintained. No osseous lesion. No acute sinus disease. BILATERAL mastoid fluid similar to CT. RIGHT occipital scalp hematoma.  IMPRESSION: Medial RIGHT frontal acute parenchymal hemorrhage with adjacent subdural and subarachnoid blood consistent with a posttraumatic shearing injury.  Small foci of restricted diffusion within the RIGHT cingulate gyrus and RIGHT frontal subcortical white matter representing probable associated ischemia.  Atrophy and small vessel disease.  No visible pericallosal artery aneurysm or parenchymal arteriovenous malformation.   Electronically Signed   By: Rolla Flatten M.D.   On: 06/04/2014 11:44    Scheduled Meds: . hydrALAZINE  50 mg Oral BID  . insulin aspart  0-9 Units Subcutaneous 6 times per day  . losartan  50 mg Oral Daily  . metoprolol  100  mg Oral Daily  . pantoprazole  80 mg Oral Q1200  . rosuvastatin  10 mg Oral Daily  . sodium chloride  3 mL Intravenous Q12H  . sucralfate  1 g Oral QID   Continuous Infusions: . sodium chloride 1,000 mL (06/05/14 1009)     Time spent: > 35 minutes    Velvet Bathe  [facility] Pager [phone] If 7PM-7AM, please contact night-coverage at [email or web address], password [facility] 06/05/2014, 4:48 PM  LOS: 2 days

## 2014-06-05 NOTE — ED Provider Notes (Signed)
Medical screening examination/treatment/procedure(s) were performed by non-physician practitioner and as supervising physician I was immediately available for consultation/collaboration.   EKG Interpretation   Date/Time:  Monday June 03 2014 14:52:41 EDT Ventricular Rate:  87 PR Interval:  144 QRS Duration: 85 QT Interval:  447 QTC Calculation: 538 R Axis:   44 Text Interpretation:  Sinus rhythm Borderline T abnormalities, lateral  leads Prolonged QT interval Baseline wander in lead(s) II III aVF Since  previous tracing QT has lengthened Confirmed by Canary Brim  MD, Casha Estupinan [phone])  on 06/03/2014 4:20:34 PM       Threasa Beards, MD 06/05/14 [phone]

## 2014-06-05 NOTE — Evaluation (Signed)
Clinical/Bedside Swallow Evaluation Patient Details  Name: Justin Patel MRN: [id number] Date of Birth: Feb 06, 1951  Today's Date: 06/05/2014 Time: 5277-8242 SLP Time Calculation (min): 10 min  Past Medical History:  Past Medical History  Diagnosis Date  . Hypertension   . Anxiety   . Sleep apnea     hx not now since wt loss  . Diabetes mellitus   . Stones in the urinary tract   . GERD (gastroesophageal reflux disease)   . H/O hiatal hernia   . Headache(784.0)   . Arthritis   . 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 Gastric ulcer   . Degenerative disc disease   . Sacral fracture, closed    Past Surgical History:  Past Surgical History  Procedure Laterality Date  . Cardiac catheterization      [facility], normal  . Hernia repair  2000    hiatal  . Gastric bypass  2010    [place]  . Knee arthroscopy      bilateral, left x 2  . Osteotomy  2001    left  . Tonsillectomy    . Appendectomy    . Nasal sinus surgery      x5  . Uvulopalatoplasty  2011  . Anterior cervical decomp/discectomy fusion  01/26/2012    Procedure: ANTERIOR CERVICAL DECOMPRESSION/DISCECTOMY FUSION 3 LEVELS;  Surgeon: Floyce Stakes, MD;  Location: [facility];  Service: Neurosurgery;  Laterality: N/A;  Cervical three-four Cervical four-five Cervical five-six Cervical six-seven , Anterior cervical decompression/diskectomy, fusion, plate   HPI:  63 y.o. year old male with significant past medical history of recent L TKR, type 2 DM, HTN, anemia, morbid obesity s/p gastric bypass, hx/o GIB, CAD s/p cardiac catheterization admitted with syncope, fall,.  MRI revealed Medial RIGHT frontal acute parenchymal hemorrhage with adjacent subdural and subarachnoid blood consistent with a posttraumatic shearing injury. CXR 10/26 No acute cardiopulmonary abnormality seen.    Assessment / Plan / Recommendation Clinical Impression  Pt. demonstrated functional oral and pharyngeal swallow  without evidence of aspiration or residual.  No difficulties reported from pt.   Recommend continue regular texture, thin liquids.  No follow up needed.      Aspiration Risk  Mild    Diet Recommendation Regular;Thin liquid   Liquid Administration via: Cup;Straw Medication Administration: Whole meds with liquid Supervision: Patient able to self feed Compensations: Slow rate;Small sips/bites Postural Changes and/or Swallow Maneuvers: Seated upright 90 degrees    Other  Recommendations Oral Care Recommendations: Oral care BID   Follow Up Recommendations  None    Frequency and Duration        Pertinent Vitals/Pain No pain      Swallow Study          Oral/Motor/Sensory Function Overall Oral Motor/Sensory Function: Appears within functional limits for tasks assessed   Ice Chips Ice chips: Not tested   Thin Liquid Thin Liquid: Within functional limits Presentation: Straw    Nectar Thick Nectar Thick Liquid: Not tested   Honey Thick Honey Thick Liquid: Not tested   Puree Puree: Not tested   Solid   GO    Solid: Within functional limits       Alan Riles, Orbie Pyo 06/05/2014,2:42 PM   Orbie Pyo [email or web address] [phone]

## 2014-06-05 NOTE — Progress Notes (Signed)
Subjective: Resting comfortably. Notes feeling of vertigo and unsteadiness when attempting to stand  MRI completed shows Medial RIGHT frontal acute parenchymal hemorrhage with adjacent  subdural and subarachnoid blood consistent with a posttraumatic  shearing injury.  Small foci of restricted diffusion within the RIGHT cingulate gyrus  and RIGHT frontal subcortical white matter representing probable  associated ischemia.   Objective: Current vital signs: BP 167/85  Pulse 55  Temp(Src) 98.6 F (37 C) (Oral)  Resp 18  Ht 5\' 7"  (1.702 m)  Wt 79.198 kg (174 lb 9.6 oz)  BMI 27.34 kg/m2  SpO2 100% Vital signs in last 24 hours: Temp:  [98.2 F (36.8 C)-99.1 F (37.3 C)] 98.6 F (37 C) (10/28 0453) Pulse Rate:  [55-73] 55 (10/28 0453) Resp:  [18] 18 (10/28 0453) BP: (167-184)/(78-89) 167/85 mmHg (10/28 0453) SpO2:  [98 %-100 %] 100 % (10/28 0453) Weight:  [79.198 kg (174 lb 9.6 oz)] 79.198 kg (174 lb 9.6 oz) (10/28 0453)  Intake/Output from previous day: 10/27 0701 - 10/28 0700 In: 2812.5 [P.O.:480; I.V.:2332.5] Out: 976 [Urine:976] Intake/Output this shift:   Nutritional status:    Neurologic Exam: Mental Status:  Alert, oriented, thought content appropriate. Speech fluent without evidence of aphasia. Cranial Nerves:  II: Pupils equal, round, reactive to light III,IV, VI: ptosis not present, extra-ocular motions intact bilaterally  V,VII: smile symmetric, facial light touch sensation intact VIII: hearing normal bilaterally  Motor:  Right : Upper extremity 5/5 Left: Upper extremity 5/5  Lower extremity 5/5 Lower extremity 5/5  Tone and bulk:normal tone throughout; no atrophy noted  Sensory: Pinprick and light touch intact with the exception of the left leg that continues to have paresthesias related to the surgery.  Deep Tendon Reflexes: 2+ and symmetric throughout with left knee jerk not tested  Plantars:  Right: downgoing Left: downgoing  Cerebellar:  normal  finger-to-nose and normal heel-to-shin testing bilaterally   Lab Results: Basic Metabolic Panel:  Recent Labs Lab 06/03/14 1528 06/04/14 0420 06/05/14 0400  NA 138 140 137  K 4.0 4.5 4.1  CL 104 106 103  CO2 19 22 23   GLUCOSE 83 101* 122*  BUN 17 23 13   CREATININE 1.12 1.09 0.94  CALCIUM 9.4 9.2 9.0    Liver Function Tests:  Recent Labs Lab 06/04/14 0420  AST 9  ALT 7  ALKPHOS 68  BILITOT 0.2*  PROT 6.2  ALBUMIN 3.0*   No results found for this basename: LIPASE, AMYLASE,  in the last 168 hours No results found for this basename: AMMONIA,  in the last 168 hours  CBC:  Recent Labs Lab 06/03/14 1528 06/04/14 0420 06/05/14 0400  WBC 9.7 8.5 7.3  NEUTROABS 7.7 6.2  --   HGB 10.7* 9.6* 9.6*  HCT 32.7* 29.5* 29.1*  MCV 100.3* 97.7 98.6  PLT 371 333 262    Cardiac Enzymes:  Recent Labs Lab 06/03/14 2257 06/04/14 0420 06/04/14 1300  TROPONINI <0.30 <0.30 <0.30    Lipid Panel: No results found for this basename: CHOL, TRIG, HDL, CHOLHDL, VLDL, LDLCALC,  in the last 168 hours  CBG:  Recent Labs Lab 06/04/14 1649 06/04/14 2026 06/05/14 06/05/14 0423 06/05/14 0751  GLUCAP 124* 130* 118* 19 103*    Microbiology: Results for orders placed during the hospital encounter of 01/26/12  SURGICAL PCR SCREEN     Status: Abnormal   Collection Time    01/25/12  2:21 PM      Result Value Ref Range Status   MRSA, PCR NEGATIVE  NEGATIVE Final   Staphylococcus aureus POSITIVE (*) NEGATIVE Final   Comment:            The Xpert SA Assay (FDA     approved for NASAL specimens     only), is one component of     a comprehensive surveillance     program.  It is not intended     to diagnose infection nor to     guide or monitor treatment.    Coagulation Studies:  Recent Labs  06/03/14 1528  LABPROT 13.1  INR 0.99    Imaging: Dg Chest 2 View  06/03/2014   CLINICAL DATA:  Loss of consciousness after fall.  EXAM: CHEST  2 VIEW  COMPARISON:  January 25, 2012.  FINDINGS: The heart size and mediastinal contours are within normal limits. Both lungs are clear. No pneumothorax or pleural effusion is noted. The visualized skeletal structures are unremarkable.  IMPRESSION: No acute cardiopulmonary abnormality seen.   Electronically Signed   By: Sabino Dick M.D.   On: 06/03/2014 22:03   Ct Head Wo Contrast  06/03/2014   CLINICAL DATA:  Patient stood up to open a door, then passed out and fell backwards hitting his head. Patient is taking Lovenox injections. Patient hit his head on a hardwood floor. There was reported seizure-like activity following the fall. Patient has a posterior scalp laceration.  EXAM: CT HEAD WITHOUT CONTRAST  CT CERVICAL SPINE WITHOUT CONTRAST  TECHNIQUE: Multidetector CT imaging of the head and cervical spine was performed following the standard protocol without intravenous contrast. Multiplanar CT image reconstructions of the cervical spine were also generated.  COMPARISON:  Cervical CT, 12/30/2011.  No prior head CT.  FINDINGS: CT HEAD FINDINGS  A small amount of acute hemorrhage extends along the interhemispheric fissure above the corpus callosum, most likely subdural in location. Small amount of adjacent parenchymal hemorrhage is seen in the medial right frontal lobe extending over an area of approximately 13 mm x 13 mm.  No other intracranial hemorrhage.  There are no parenchymal masses or mass effect. The ventricles are normal in configuration. There is age related ventricular and sulcal enlargement. No hydrocephalus.  There is no evidence of an ischemic infarct.  No extra-axial masses.  No skull fracture. There are changes from previous sinus surgery. Minor mucosal thickening lines remaining ethmoid air cells. Mastoid air cells are clear.  A small amount of air lies in the right posterior scalp soft tissues with a small amount of associated hemorrhage reflecting the patient's laceration. No radiopaque foreign body.  CT CERVICAL SPINE  FINDINGS  There has been a previous long anterior cervical spine fusion from C3 through C7. Anterior fusion plate and fixation screws are well-seated. There is mature bone graft material across the C3-C4 through C6-C7 disc spaces.  There is a grade 1 anterolisthesis of C7 on T1. Moderate loss of disc height is noted at this level. There are disc degenerative changes bilaterally also at this level. This is a chronic finding.  There is no fracture or acute finding.  End plate productive change at C5-C6 and C6-C7 indents the ventral thecal sac causing at least mild central stenosis. There are multiple levels of neural foraminal narrowing, greatest bilaterally at C6-C7 severe on the right and moderate on the left.  Bones are demineralized.  Surrounding soft tissues are unremarkable.  Lung apices are clear.  IMPRESSION: HEAD CT: There is a small amount of extra-axial intracranial hemorrhage along the interhemispheric fissure adjacent to  the corpus callosum, likely subdural. There is also a small amount of adjacent parenchymal hemorrhage in the medial right frontal lobe measuring 13 mm size.  No other intracranial hemorrhage.  No skull fracture.  CERVICAL CT: No fracture or acute finding. No evidence of disruption of the anterior fusion hardware.   Electronically Signed   By: Lajean Manes M.D.   On: 06/03/2014 16:00   Ct Cervical Spine Wo Contrast  06/03/2014   CLINICAL DATA:  Patient stood up to open a door, then passed out and fell backwards hitting his head. Patient is taking Lovenox injections. Patient hit his head on a hardwood floor. There was reported seizure-like activity following the fall. Patient has a posterior scalp laceration.  EXAM: CT HEAD WITHOUT CONTRAST  CT CERVICAL SPINE WITHOUT CONTRAST  TECHNIQUE: Multidetector CT imaging of the head and cervical spine was performed following the standard protocol without intravenous contrast. Multiplanar CT image reconstructions of the cervical spine were also  generated.  COMPARISON:  Cervical CT, 12/30/2011.  No prior head CT.  FINDINGS: CT HEAD FINDINGS  A small amount of acute hemorrhage extends along the interhemispheric fissure above the corpus callosum, most likely subdural in location. Small amount of adjacent parenchymal hemorrhage is seen in the medial right frontal lobe extending over an area of approximately 13 mm x 13 mm.  No other intracranial hemorrhage.  There are no parenchymal masses or mass effect. The ventricles are normal in configuration. There is age related ventricular and sulcal enlargement. No hydrocephalus.  There is no evidence of an ischemic infarct.  No extra-axial masses.  No skull fracture. There are changes from previous sinus surgery. Minor mucosal thickening lines remaining ethmoid air cells. Mastoid air cells are clear.  A small amount of air lies in the right posterior scalp soft tissues with a small amount of associated hemorrhage reflecting the patient's laceration. No radiopaque foreign body.  CT CERVICAL SPINE FINDINGS  There has been a previous long anterior cervical spine fusion from C3 through C7. Anterior fusion plate and fixation screws are well-seated. There is mature bone graft material across the C3-C4 through C6-C7 disc spaces.  There is a grade 1 anterolisthesis of C7 on T1. Moderate loss of disc height is noted at this level. There are disc degenerative changes bilaterally also at this level. This is a chronic finding.  There is no fracture or acute finding.  End plate productive change at C5-C6 and C6-C7 indents the ventral thecal sac causing at least mild central stenosis. There are multiple levels of neural foraminal narrowing, greatest bilaterally at C6-C7 severe on the right and moderate on the left.  Bones are demineralized.  Surrounding soft tissues are unremarkable.  Lung apices are clear.  IMPRESSION: HEAD CT: There is a small amount of extra-axial intracranial hemorrhage along the interhemispheric fissure  adjacent to the corpus callosum, likely subdural. There is also a small amount of adjacent parenchymal hemorrhage in the medial right frontal lobe measuring 13 mm size.  No other intracranial hemorrhage.  No skull fracture.  CERVICAL CT: No fracture or acute finding. No evidence of disruption of the anterior fusion hardware.   Electronically Signed   By: Lajean Manes M.D.   On: 06/03/2014 16:00   Mr Brain Wo Contrast  06/04/2014   CLINICAL DATA:  Syncopal episode followed by a seizure 06/03/2014. Patient struck head on floor. Patient anticoagulated.  EXAM: MRI HEAD WITHOUT CONTRAST  TECHNIQUE: Multiplanar, multiecho pulse sequences of the brain and surrounding structures were  obtained without intravenous contrast.  COMPARISON:  CT head and cervical spine 06/03/2014.  FINDINGS: There is a medial RIGHT frontal parenchymal hemorrhage with adjacent subdural and subarachnoid blood which is likely posttraumatic. I do not see evidence for pericallosal artery aneurysm or parenchymal vascular malformation. As measured on gradient sequence 8 image 132 cross-section of the parenchymal and subdural blood is 10 x 17 mm, which may be slightly increased from CT.  On diffusion-weighted imaging, there is restricted diffusion within the cingulate gyrus and more superior subcortical white matter which may represent associated ischemia within the RIGHT ACA territory. No similar changes on the LEFT.  Generalized atrophy premature for age. Mild subcortical and periventricular T2 and FLAIR hyperintensities, likely chronic microvascular ischemic change. Tiny focus hemorrhage RIGHT parietal subcortical white matter in a linear fashion, likely remote insult. Previous cervical fusion is incompletely evaluated. No midline abnormality of the pituitary and cerebellar tonsils. Flow voids are maintained. No osseous lesion. No acute sinus disease. BILATERAL mastoid fluid similar to CT. RIGHT occipital scalp hematoma.  IMPRESSION: Medial RIGHT  frontal acute parenchymal hemorrhage with adjacent subdural and subarachnoid blood consistent with a posttraumatic shearing injury.  Small foci of restricted diffusion within the RIGHT cingulate gyrus and RIGHT frontal subcortical white matter representing probable associated ischemia.  Atrophy and small vessel disease.  No visible pericallosal artery aneurysm or parenchymal arteriovenous malformation.   Electronically Signed   By: Rolla Flatten M.D.   On: 06/04/2014 11:44    Medications:  Scheduled: . hydrALAZINE  50 mg Oral BID  . insulin aspart  0-9 Units Subcutaneous 6 times per day  . losartan  50 mg Oral Daily  . metoprolol  100 mg Oral Daily  . pantoprazole  80 mg Oral Q1200  . rosuvastatin  10 mg Oral Daily  . sodium chloride  3 mL Intravenous Q12H  . sucralfate  1 g Oral QID    Assessment/Plan:  63 year old male presenting after a syncopal episode with jerking noted after event. Unclear if the jerking was related to hypoperfusion from the syncope or if this was epileptic in origin. Patient on Ultram. Has been on Lovenox at home. Last dose 2 days ago. Head CT reviewed and shows a interhemispheric SDH with some associated right frontal lobe parenchymal hemorrhage which is small. MRI completed showed similar with small area of restricted diffusion around the hemorrhage. Imaging reviewed with stroke neurology, suspect area of restricted diffusion is related to underlying ICH and does not represent an acute infarct or trigger of syncopal episode.   Unclear etiology of gait instability and feeling of vertigo. May represent a post concussive syndrome.  -would repeat head CT today, if ICH stable no further imaging required at this time -would hold on further stroke workup as areas of restricted diffusion likely related to traumatic ICH -hold all antiplatelet and anticoagulation -no indication for antiepileptic medication at this time -PT evaluation for gait instability   LOS: 2 days    Jim Like, DO [facility] [phone]  If 7pm- 7am, please page neurology on call as listed in [place]. 06/05/2014  8:18 AM

## 2014-06-06 LAB — GLUCOSE, CAPILLARY
GLUCOSE-CAPILLARY: 92 mg/dL (ref 70–99)
Glucose-Capillary: 96 mg/dL (ref 70–99)
Glucose-Capillary: 100 mg/dL — ABNORMAL HIGH (ref 70–99)
Glucose-Capillary: 130 mg/dL — ABNORMAL HIGH (ref 70–99)

## 2014-06-06 MED ORDER — HYDRALAZINE HCL 20 MG/ML IJ SOLN: 10.0000 mg | Freq: Four times a day (QID) | INTRAMUSCULAR | Status: DC | PRN

## 2014-06-06 MED ORDER — MECLIZINE HCL 25 MG PO TABS
12.5000 mg | ORAL_TABLET | Freq: Three times a day (TID) | ORAL | Status: DC | PRN
  Administered 2014-06-06 – 2014-06-07 (×3): 12.5 mg via ORAL
  Filled 2014-06-06 (×3): qty 1

## 2014-06-06 MED ORDER — AMLODIPINE BESYLATE 5 MG PO TABS
5.0000 mg | ORAL_TABLET | Freq: Every day | ORAL | Status: DC
  Administered 2014-06-06 – 2014-06-07 (×2): 5 mg via ORAL
  Filled 2014-06-06: qty 2
  Filled 2014-06-06: qty 1

## 2014-06-06 NOTE — Progress Notes (Signed)
[facility] PROGRESS NOTE  Justin Patel YKD:[id number] DOB: 18-Sep-1950 DOA: 06/03/2014 PCP: Rica Mast, MD From prior PN: 63 y.o. year old male with significant past medical history of recent L TKR recently on lovenox, type 2 DM, poorly controlled HTN, anemia, morbid obesity s/p gastric bypass, hx/o GIB, CAD s/p cardiac catheterization presenting with syncope, fall, SDH.   Assessment/Plan: Syncope due to CVA  - MRI positive for probable ischemia  - 2D ECHO, EEG: This normal EEG is recorded in the waking state. There was no seizure or seizure predisposition recorded on this study - Neurology on board and assisting with case - PT on board  SDH/IPH  - trauma and NS aware of case per EDP   - HOLD anticoagulation  - f/u NS recs   HTN  - Not well controlled today. - pt on losartan, hydralazine, metoprolol. - Will add amlodipine 5 mg po daily - cont  To monitor and if persistently elevated will plan on increasing antihypertensive medications.  DM  - SSI, A1C  - diabetic diet.  Anemia  - hgb 10.7>>9.6, and currently at 9.6  CAD  -no active CP reported to me by patient - CEs negative x 3   Code Status: full Family Communication: None at bedside Disposition Plan: If continued improvement most likely discharge next a.m.   Consultants:  Neurology  Procedures: - MRI completed shows  Medial RIGHT frontal acute parenchymal hemorrhage with adjacent  subdural and subarachnoid blood consistent with a posttraumatic  shearing injury.  Small foci of restricted diffusion within the RIGHT cingulate gyrus  and RIGHT frontal subcortical white matter representing probable  associated ischemia. - EEG  Antibiotics:  None   HPI/Subjective: The patient reports that after Epley maneuvers his dizziness is improved  Objective: Filed Vitals:   06/06/14 1528  BP: 162/97  Pulse: 63  Temp: 98.7 F (37.1 C)  Resp: 18    Intake/Output Summary (Last 24 hours)  at 06/06/14 1900 Last data filed at 06/06/14 0900  Gross per 24 hour  Intake    240 ml  Output   1100 ml  Net   -860 ml   Filed Weights   06/04/14 0124 06/05/14 0453 06/06/14 0442  Weight: 78.019 kg (172 lb) 79.198 kg (174 lb 9.6 oz) 85.14 kg (187 lb 11.2 oz)    Exam:   General:  Pt in nad, alert and awake  Cardiovascular: rrr, no mrg  Respiratory: cta bl, no wheezes  Abdomen: soft, ND  Musculoskeletal: no cyanosis or clubbing   Data Reviewed: Basic Metabolic Panel:  Recent Labs Lab 06/03/14 1528 06/04/14 0420 06/05/14 0400  NA 138 140 137  K 4.0 4.5 4.1  CL 104 106 103  CO2 19 22 23   GLUCOSE 83 101* 122*  BUN 17 23 13   CREATININE 1.12 1.09 0.94  CALCIUM 9.4 9.2 9.0   Liver Function Tests:  Recent Labs Lab 06/04/14 0420  AST 9  ALT 7  ALKPHOS 68  BILITOT 0.2*  PROT 6.2  ALBUMIN 3.0*   No results found for this basename: LIPASE, AMYLASE,  in the last 168 hours No results found for this basename: AMMONIA,  in the last 168 hours CBC:  Recent Labs Lab 06/03/14 1528 06/04/14 0420 06/05/14 0400  WBC 9.7 8.5 7.3  NEUTROABS 7.7 6.2  --   HGB 10.7* 9.6* 9.6*  HCT 32.7* 29.5* 29.1*  MCV 100.3* 97.7 98.6  PLT 371 333 262   Cardiac Enzymes:  Recent Labs Lab 06/03/14 2257  06/04/14 0420 06/04/14 1300  TROPONINI <0.30 <0.30 <0.30   BNP (last 3 results) No results found for this basename: PROBNP,  in the last 8760 hours CBG:  Recent Labs Lab 06/05/14 1650 06/05/14 2042 06/06/14 0733 06/06/14 1135 06/06/14 1641  GLUCAP 90 99 96 92 100*    No results found for this or any previous visit (from the past 240 hour(s)).   Studies: Ct Head Wo Contrast  06/05/2014   CLINICAL DATA:  Followup subdural hematoma  EXAM: CT HEAD WITHOUT CONTRAST  TECHNIQUE: Contiguous axial images were obtained from the base of the skull through the vertex without intravenous contrast.  COMPARISON:  06/03/2014  FINDINGS: The bony calvarium is again intact. Surgical  staples are noted in the right posterior parietal scalp  The previously seen subdural hematoma is again identified and has decreased slightly in size from the prior exam. Some parenchymal increased attenuation is noted consistent with hemorrhage. This is also decreased in size now measuring approximately 6 mm in size. No new areas of hemorrhage are noted.  IMPRESSION: Improvement in subdural and parenchymal hemorrhage when compared with the original exam. No new focal abnormality is seen.   Electronically Signed   By: Inez Catalina M.D.   On: 06/05/2014 11:26    Scheduled Meds: . hydrALAZINE  50 mg Oral BID  . insulin aspart  0-9 Units Subcutaneous TID WC  . losartan  50 mg Oral Daily  . metoprolol  100 mg Oral Daily  . pantoprazole  80 mg Oral Q1200  . rosuvastatin  10 mg Oral Daily  . sodium chloride  3 mL Intravenous Q12H  . sucralfate  1 g Oral QID   Continuous Infusions: . sodium chloride 1,000 mL (06/06/14 1626)     Time spent: > 35 minutes    Velvet Bathe  [facility] Pager [phone] If 7PM-7AM, please contact night-coverage at [email or web address], password [facility] 06/06/2014, 7:00 PM  LOS: 3 days

## 2014-06-06 NOTE — Evaluation (Signed)
Physical Therapy Evaluation Patient Details Name: Justin Patel MRN: [id number] DOB: 1950-09-12 Today's Date: 06/06/2014   History of Present Illness    63 year old male presenting after a syncopal episode with jerking noted after event. Unclear if the jerking was related to hypoperfusion from the syncope or if this was epileptic in origin. Patient on Ultram. Has been on Lovenox at home. Last dose 2 days ago. Head CT reviewed and shows a interhemispheric SDH with some associated right frontal lobe parenchymal hemorrhage which is small. MRI completed showed similar with small area of restricted diffusion around the hemorrhage. Imaging reviewed with stroke neurology, suspect area of restricted diffusion is related to underlying ICH and does not represent an acute infarct or trigger of syncopal episode.      Clinical Impression  Pt admitted with above. Pt currently with functional limitations due to the deficits listed below (see PT Problem List).  Treated pt for BPPV left side with good results.  Pt feeling better.  Will need HHPT for vestibular rehab with transition to [place]. And use his RW at all times initially.   Pt will benefit from skilled PT to increase their independence and safety with mobility to allow discharge to the venue listed below.     Follow Up Recommendations Home health PT;Supervision - Intermittent (for vestibular rehab and knee rehab)    Equipment Recommendations  None recommended by PT    Recommendations for Other Services       Precautions / Restrictions Precautions Precautions: Fall Restrictions Weight Bearing Restrictions: No      Mobility  Bed Mobility Overal bed mobility: Needs Assistance Bed Mobility: Supine to Sit     Supine to sit: Min guard     General bed mobility comments: Initially needed min guard assist due to dizziness.  Assessed for BPPV and positive for left BPPV therefore PT treated pt with left canalith repositioning maneuver.  Pt did  not have any dizziness the rest of the treatment after canalith repositioning.    Transfers Overall transfer level: Needs assistance Equipment used: None Transfers: Sit to/from Stand Sit to Stand: Min guard         General transfer comment: Steadying assist provided since pt had not been up since Monday.    Ambulation/Gait Ambulation/Gait assistance: Min assist Ambulation Distance (Feet): 180 Feet Assistive device: None Gait Pattern/deviations: Step-through pattern;Decreased stride length;Drifts right/left   Gait velocity interpretation: Below normal speed for age/gender General Gait Details: Pt needed guard assist  to ambulate in halls. Occasional drifts right and left but no significant LOB however no challenges given to balance.  Pt instructed to use RW at all times on d/c for safety.    Stairs            Wheelchair Mobility    Modified Rankin (Stroke Patients Only) Modified Rankin (Stroke Patients Only) Pre-Morbid Rankin Score: Slight disability Modified Rankin: Moderately severe disability     Balance Overall balance assessment: Needs assistance;History of Falls Sitting-balance support: No upper extremity supported;Feet supported Sitting balance-Leahy Scale: Good     Standing balance support: No upper extremity supported;During functional activity Standing balance-Leahy Scale: Fair Standing balance comment: Pt can stand without device without assist statically.                               Pertinent Vitals/Pain Pain Assessment: No/denies pain VSS    Home Living Family/patient expects to be discharged to:: Private  residence Living Arrangements: Spouse/significant other Available Help at Discharge: Family;Available PRN/intermittently Type of Home: House Home Access: Stairs to enter Entrance Stairs-Rails: None Entrance Stairs-Number of Steps: 3 Home Layout: One level Home Equipment: Shower seat - built in;Walker - 2 wheels;Cane - single  point;Crutches      Prior Function Level of Independence: Independent         Comments: retired     Journalist, newspaper   Dominant Hand: Right    Extremity/Trunk Assessment   Upper Extremity Assessment: Defer to OT evaluation           Lower Extremity Assessment: LLE deficits/detail   LLE Deficits / Details: Recent TKR.  ROM at left knee 0-100 degrees.  Strength WFL  Cervical / Trunk Assessment: Normal  Communication   Communication: No difficulties  Cognition Arousal/Alertness: Awake/alert Behavior During Therapy: WFL for tasks assessed/performed Overall Cognitive Status: Within Functional Limits for tasks assessed                      General Comments      Exercises        Assessment/Plan    PT Assessment Patient needs continued PT services  PT Diagnosis Difficulty walking (dizziness)   PT Problem List Decreased activity tolerance;Decreased balance;Decreased mobility;Decreased knowledge of use of DME;Decreased safety awareness;Decreased knowledge of precautions  PT Treatment Interventions DME instruction;Gait training;Functional mobility training;Therapeutic activities;Therapeutic exercise;Balance training;Patient/family education   PT Goals (Current goals can be found in the Care Plan section) Acute Rehab PT Goals Patient Stated Goal: to gohome PT Goal Formulation: With patient Time For Goal Achievement: 06/13/14 Potential to Achieve Goals: Good    Frequency Min 3X/week   Barriers to discharge        Co-evaluation               End of Session Equipment Utilized During Treatment: Gait belt Activity Tolerance: Patient limited by fatigue (limited by dizziness) Patient left: in chair;with call bell/phone within reach Nurse Communication: Mobility status         Time: 9794-8016 PT Time Calculation (min): 45 min   Charges:   PT Evaluation $Initial PT Evaluation Tier I: 1 Procedure PT Treatments $Gait Training: 8-22 mins $Self  Care/Home Management: 8-22 $Canalith Rep Proc: 8-22 mins   PT G CodesIrwin Brakeman F 2014-06-20, 12:06 PM  Juliya Magill,PT Acute Rehabilitation [phone] (pager)

## 2014-06-06 NOTE — Progress Notes (Signed)
Subjective: Resting comfortably. Notes continued feeling of vertigo and unsteadiness when attempting to stand. Repeat head CT stable.  Objective: Current vital signs: BP 180/95  Pulse 59  Temp(Src) 98.6 F (37 C) (Oral)  Resp 18  Ht 5\' 7"  (1.702 m)  Wt 85.14 kg (187 lb 11.2 oz)  BMI 29.39 kg/m2  SpO2 100% Vital signs in last 24 hours: Temp:  [98.6 F (37 C)-99.8 F (37.7 C)] 98.6 F (37 C) (10/29 0442) Pulse Rate:  [59-74] 59 (10/29 0442) Resp:  [16-20] 18 (10/29 0442) BP: (155-186)/(90-101) 180/95 mmHg (10/29 0445) SpO2:  [99 %-100 %] 100 % (10/29 0442) Weight:  [85.14 kg (187 lb 11.2 oz)] 85.14 kg (187 lb 11.2 oz) (10/29 0442)  Intake/Output from previous day: 10/28 0701 - 10/29 0700 In: 2330 [P.O.:720; I.V.:825] Out: 1400 [Urine:1400] Intake/Output this shift:   Nutritional status:    Neurologic Exam: Mental Status:  Alert, oriented, thought content appropriate. Speech fluent without evidence of aphasia. Cranial Nerves:  II: Pupils equal, round, reactive to light III,IV, VI: ptosis not present, extra-ocular motions intact bilaterally  V,VII: smile symmetric, facial light touch sensation intact VIII: hearing normal bilaterally  Motor:  Right : Upper extremity 5/5 Left: Upper extremity 5/5  Lower extremity 5/5 Lower extremity 5/5  Tone and bulk:normal tone throughout; no atrophy noted  Sensory: Pinprick and light touch intact with the exception of the left leg that continues to have paresthesias related to the surgery.  Deep Tendon Reflexes: 2+ and symmetric throughout with left knee jerk not tested  Plantars:  Right: downgoing Left: downgoing  Cerebellar:  normal finger-to-nose and normal heel-to-shin testing bilaterally   Lab Results: Basic Metabolic Panel:  Recent Labs Lab 06/03/14 1528 06/04/14 0420 06/05/14 0400  NA 138 140 137  K 4.0 4.5 4.1  CL 104 106 103  CO2 19 22 23   GLUCOSE 83 101* 122*  BUN 17 23 13   CREATININE 1.12 1.09 0.94  CALCIUM  9.4 9.2 9.0    Liver Function Tests:  Recent Labs Lab 06/04/14 0420  AST 9  ALT 7  ALKPHOS 68  BILITOT 0.2*  PROT 6.2  ALBUMIN 3.0*   No results found for this basename: LIPASE, AMYLASE,  in the last 168 hours No results found for this basename: AMMONIA,  in the last 168 hours  CBC:  Recent Labs Lab 06/03/14 1528 06/04/14 0420 06/05/14 0400  WBC 9.7 8.5 7.3  NEUTROABS 7.7 6.2  --   HGB 10.7* 9.6* 9.6*  HCT 32.7* 29.5* 29.1*  MCV 100.3* 97.7 98.6  PLT 371 333 262    Cardiac Enzymes:  Recent Labs Lab 06/03/14 2257 06/04/14 0420 06/04/14 1300  TROPONINI <0.30 <0.30 <0.30    Lipid Panel: No results found for this basename: CHOL, TRIG, HDL, CHOLHDL, VLDL, LDLCALC,  in the last 168 hours  CBG:  Recent Labs Lab 06/05/14 0751 06/05/14 1140 06/05/14 1650 06/05/14 2042 06/06/14 0733  GLUCAP 103* 95 90 99 96    Microbiology: Results for orders placed during the hospital encounter of 01/26/12  SURGICAL PCR SCREEN     Status: Abnormal   Collection Time    01/25/12  2:21 PM      Result Value Ref Range Status   MRSA, PCR NEGATIVE  NEGATIVE Final   Staphylococcus aureus POSITIVE (*) NEGATIVE Final   Comment:            The Xpert SA Assay (FDA     approved for NASAL specimens  only), is one component of     a comprehensive surveillance     program.  It is not intended     to diagnose infection nor to     guide or monitor treatment.    Coagulation Studies:  Recent Labs  06/03/14 1528  LABPROT 13.1  INR 0.99    Imaging: Ct Head Wo Contrast  06/05/2014   CLINICAL DATA:  Followup subdural hematoma  EXAM: CT HEAD WITHOUT CONTRAST  TECHNIQUE: Contiguous axial images were obtained from the base of the skull through the vertex without intravenous contrast.  COMPARISON:  06/03/2014  FINDINGS: The bony calvarium is again intact. Surgical staples are noted in the right posterior parietal scalp  The previously seen subdural hematoma is again identified  and has decreased slightly in size from the prior exam. Some parenchymal increased attenuation is noted consistent with hemorrhage. This is also decreased in size now measuring approximately 6 mm in size. No new areas of hemorrhage are noted.  IMPRESSION: Improvement in subdural and parenchymal hemorrhage when compared with the original exam. No new focal abnormality is seen.   Electronically Signed   By: Inez Catalina M.D.   On: 06/05/2014 11:26   Mr Brain Wo Contrast  06/04/2014   CLINICAL DATA:  Syncopal episode followed by a seizure 06/03/2014. Patient struck head on floor. Patient anticoagulated.  EXAM: MRI HEAD WITHOUT CONTRAST  TECHNIQUE: Multiplanar, multiecho pulse sequences of the brain and surrounding structures were obtained without intravenous contrast.  COMPARISON:  CT head and cervical spine 06/03/2014.  FINDINGS: There is a medial RIGHT frontal parenchymal hemorrhage with adjacent subdural and subarachnoid blood which is likely posttraumatic. I do not see evidence for pericallosal artery aneurysm or parenchymal vascular malformation. As measured on gradient sequence 8 image 132 cross-section of the parenchymal and subdural blood is 10 x 17 mm, which may be slightly increased from CT.  On diffusion-weighted imaging, there is restricted diffusion within the cingulate gyrus and more superior subcortical white matter which may represent associated ischemia within the RIGHT ACA territory. No similar changes on the LEFT.  Generalized atrophy premature for age. Mild subcortical and periventricular T2 and FLAIR hyperintensities, likely chronic microvascular ischemic change. Tiny focus hemorrhage RIGHT parietal subcortical white matter in a linear fashion, likely remote insult. Previous cervical fusion is incompletely evaluated. No midline abnormality of the pituitary and cerebellar tonsils. Flow voids are maintained. No osseous lesion. No acute sinus disease. BILATERAL mastoid fluid similar to CT. RIGHT  occipital scalp hematoma.  IMPRESSION: Medial RIGHT frontal acute parenchymal hemorrhage with adjacent subdural and subarachnoid blood consistent with a posttraumatic shearing injury.  Small foci of restricted diffusion within the RIGHT cingulate gyrus and RIGHT frontal subcortical white matter representing probable associated ischemia.  Atrophy and small vessel disease.  No visible pericallosal artery aneurysm or parenchymal arteriovenous malformation.   Electronically Signed   By: Rolla Flatten M.D.   On: 06/04/2014 11:44    Medications:  Scheduled: . hydrALAZINE  50 mg Oral BID  . insulin aspart  0-9 Units Subcutaneous TID WC  . losartan  50 mg Oral Daily  . metoprolol  100 mg Oral Daily  . pantoprazole  80 mg Oral Q1200  . rosuvastatin  10 mg Oral Daily  . sodium chloride  3 mL Intravenous Q12H  . sucralfate  1 g Oral QID    Assessment/Plan:  63 year old male presenting after a syncopal episode with jerking noted after event. Unclear if the jerking was related  to hypoperfusion from the syncope or if this was epileptic in origin. Patient on Ultram. Has been on Lovenox at home. Last dose 2 days ago. Head CT reviewed and shows a interhemispheric SDH with some associated right frontal lobe parenchymal hemorrhage which is small. MRI completed showed similar with small area of restricted diffusion around the hemorrhage. Imaging reviewed with stroke neurology, suspect area of restricted diffusion is related to underlying ICH and does not represent an acute infarct or trigger of syncopal episode.   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  Will need outpatient neurology follow up.    LOS: 3 days   Jim Like, DO [facility] [phone]  If 7pm- 7am, please page neurology on call as listed in [facility]. 06/06/2014  9:12 AM

## 2014-06-07 LAB — GLUCOSE, CAPILLARY
GLUCOSE-CAPILLARY: 101 mg/dL — AB (ref 70–99)
Glucose-Capillary: 92 mg/dL (ref 70–99)

## 2014-06-07 MED ORDER — MECLIZINE HCL 12.5 MG PO TABS: 12.5000 mg | ORAL_TABLET | Freq: Three times a day (TID) | ORAL | Status: DC | PRN

## 2014-06-07 MED ORDER — AMLODIPINE BESYLATE 10 MG PO TABS: 10.0000 mg | ORAL_TABLET | Freq: Every day | ORAL | Status: DC

## 2014-06-07 MED ORDER — LOSARTAN POTASSIUM 50 MG PO TABS: 50.0000 mg | ORAL_TABLET | Freq: Every day | ORAL | Status: DC

## 2014-06-07 MED ORDER — AMLODIPINE BESYLATE 10 MG PO TABS
10.0000 mg | ORAL_TABLET | Freq: Every day | ORAL | Status: DC
Start: 2014-06-07 — End: 2014-06-13

## 2014-06-07 NOTE — Progress Notes (Signed)
CSW (Clinical Education officer, museum) spoke with pt about SNF. Pt aware that insurance authorization is a lengthy process and facilities will not accept pt with pending authorization. Pt is unable to pay privately. Pt agreeable to dc home today. Pt expressed he feels comfortable with this plan he was just nervous because of his previous falls. CSW spoke with [facility] and pt has already been set up with home health services. Pt wife will be available to provide transportation and will be with pt throughout the weekend. CSW did notify MD. At this time, pt has no further hospital social work needs. CSW signing off.  [place], [place]

## 2014-06-07 NOTE — Progress Notes (Signed)
Pt discharged to home per MD order. Pt received and reviewed all discharge instructions and medication information including follow-up appointments and prescription information. Pt verbalized understanding. Pt alert and oriented at discharge with no complaints of pain. Pt IV and telemetry box removed prior to discharge. Pt escorted to private vehicle via wheelchair by nurse tech. Eulia Hatcher C  

## 2014-06-07 NOTE — Progress Notes (Signed)
Physical Therapy Treatment Patient Details Name: Justin Patel MRN: [id number] DOB: 05/09/1951 Today's Date: 06/07/2014    History of Present Illness   63 year old male presenting after a syncopal episode with jerking noted after event. Unclear if the jerking was related to hypoperfusion from the syncope or if this was epileptic in origin. Patient on Ultram. Has been on Lovenox at home. Last dose 2 days ago. Head CT reviewed and shows a interhemispheric SDH with some associated right frontal lobe parenchymal hemorrhage which is small. MRI completed showed similar with small area of restricted diffusion around the hemorrhage. Imaging reviewed with stroke neurology, suspect area of restricted diffusion is related to underlying ICH and does not represent an acute infarct or trigger of syncopal episode.      PT Comments    Pt admitted with above. Pt currently with functional limitations due to balance and endurance deficits as well as dizziness.  Pt still appropriate for home with HHPT for vestibular rehab using his RW.  Pt will benefit from skilled PT to increase their independence and safety with mobility to allow discharge to the venue listed below.   Follow Up Recommendations  Home health PT;Supervision - Intermittent (for vestibular rehab and knee rehab)     Equipment Recommendations  None recommended by PT    Recommendations for Other Services       Precautions / Restrictions Precautions Precautions: Fall Restrictions Weight Bearing Restrictions: No    Mobility  Bed Mobility Overal bed mobility: Independent                Transfers Overall transfer level: Needs assistance   Transfers: Sit to/from Stand Sit to Stand: Min guard         General transfer comment: Steadying assist provided.    Ambulation/Gait Ambulation/Gait assistance: Min guard Ambulation Distance (Feet): 30 Feet Assistive device: None Gait Pattern/deviations: Step-through pattern;Decreased  stride length   Gait velocity interpretation: Below normal speed for age/gender General Gait Details: Continues to need guard assist when ambulating without RW.  With RW, pt can walk with Modif I.     Stairs            Wheelchair Mobility    Modified Rankin (Stroke Patients Only) Modified Rankin (Stroke Patients Only) Pre-Morbid Rankin Score: Slight disability Modified Rankin: Moderately severe disability     Balance           Standing balance support: No upper extremity supported;During functional activity Standing balance-Leahy Scale: Fair Standing balance comment: Pt can stand without device statically without assist.                     Cognition Arousal/Alertness: Awake/alert Behavior During Therapy: WFL for tasks assessed/performed Overall Cognitive Status: Within Functional Limits for tasks assessed                      Exercises Other Exercises Other Exercises: Instructed pt in Bernice exercise since he has had to have 2 treatments for BPPV with bil BPPV.  Gave handout as well.  Pt verbalizes understanding of exercise.      General Comments General comments (skin integrity, edema, etc.): Pt reports feeling dizzy last night when rolling right.  Performed supine head roll bil with negative for supine head roll.  Pt was positive however for right posterior canal BPPV with 7/10 dizzy.  Treated with right canalith repositioning maneuver for right BPPV. After treatment pt reports dizzy down to 4/10.  Pertinent Vitals/Pain Pain Assessment: No/denies pain VSS    Home Living                      Prior Function            PT Goals (current goals can now be found in the care plan section) Progress towards PT goals: Progressing toward goals    Frequency  Min 3X/week    PT Plan Current plan remains appropriate    Co-evaluation             End of Session Equipment Utilized During Treatment: Gait belt Activity  Tolerance: Patient limited by fatigue (limited by dizziness) Patient left: in bed;with call bell/phone within reach     Time: 0913-0943 PT Time Calculation (min): 30 min  Charges:  $Gait Training: 8-22 mins $Canalith Rep Proc: 8-22 mins                    G CodesDenice Paradise June 28, 2014, 10:47 AM  [employer] Acute Rehabilitation [phone] (pager)

## 2014-06-07 NOTE — Progress Notes (Addendum)
06/07/14 1100  PT Visit Information  Last PT Received On 06/07/14  PT - Assessment/Plan  PT Plan Discharge plan needs to be updated  Follow Up Recommendations SNF (for vestbular rehab )  MD called PT due to pt feeling uncomfortable going home alone at this time.  Pt continues to feel unsteady at times and just feels like he needs more therapy prior to d/c home.  This PT is in agreement that he will benefit from Vestibular rehab and knee rehab at SNF if pt willing.  Pt in agreement therefore MD is going to contact SW re: placement.  Continue PT.  Thanks. [place] [phone] (pager)

## 2014-06-07 NOTE — Discharge Summary (Signed)
Physician Discharge Summary  Justin Patel:[id number] DOB: October 13, 1950 DOA: 06/03/2014  PCP: Rica Mast, MD  Admit date: 06/03/2014 Discharge date: 06/07/2014  Time spent: > 35 minutes  Recommendations for Outpatient Follow-up:  1. Please continue to monitor blood sugars, will recommend diabetic diet 2. Monitor blood pressures and adjust antihypertensive medications 3. Pt will need outpatient vestibular services 4. Will discharge home with home health 5. Pt to avoid any aspirin or any anticoagulation medication.  Discharge Diagnoses:  Active Problems:   SDH (subdural hematoma)   Syncope   Fall   Intracerebral hemorrhage   Discharge Condition: stable  Diet recommendation: diabetic diet  Filed Weights   06/05/14 0453 06/06/14 0442 06/07/14 0500  Weight: 79.198 kg (174 lb 9.6 oz) 85.14 kg (187 lb 11.2 oz) 83.28 kg (183 lb 9.6 oz)    History of present illness:  From original HPI: 63 y.o. year old male with significant past medical history of recent L TKR recently on lovenox, type 2 DM, poorly controlled HTN, anemia, morbid obesity s/p gastric bypass, hx/o GIB, CAD s/p cardiac catheterization presenting with syncope, fall, SDH. Pt opened the door to receive a ? Package, when pt had witnessed syncopal episode where he fell, striking the back of his head   Hospital Course:   Syncope due to CVA  - MRI positive for probable ischemia  - 2D ECHO, EEG: This normal EEG is recorded in the waking state. There was no seizure or seizure predisposition recorded on this study  - Neurology on board and assisted with case and recommended the following:  -would hold on further stroke workup as areas of restricted diffusion likely related to traumatic ICH and not true CVA  -hold all antiplatelet and anticoagulation  -PT evaluation for gait instability  -start Meclizine PRN  -if cleared from rehab standpoint then no further neurological inpatient workup at this time. Will  need outpatient neurology follow up.   - PT on board while pt in house. Patient to continue physical therapy on discharge  SDH/IPH  - trauma and NS aware of case per EDP  - HOLD anticoagulation  - Please see neurology's recommendations listed above.   HTN  - Not well controlled as such added amlodipine with improvement in bp readings. - pt on losartan, hydralazine, metoprolol.    DM  - recommend diabetic diet at this juncture - Pt to f/u with pcp for further evaluation and recommendations.   Anemia  - stable  CAD  -no CP reported to me by patient  - CEs negative x 3    Procedures:  CT of head  Please see below  Consultations:  Neurology  Trauma surgeon  Discharge Exam: Filed Vitals:   06/07/14 1358  BP: 144/90  Pulse: 73  Temp: 98.5 F (36.9 C)  Resp: 18    General: Pt in nad, alert and awake Cardiovascular: rrr, no mrg Respiratory: cta bl, no wheezes  Discharge Instructions You were cared for by a hospitalist during your hospital stay. If you have any questions about your discharge medications or the care you received while you were in the hospital after you are discharged, you can call the unit and asked to speak with the hospitalist on call if the hospitalist that took care of you is not available. Once you are discharged, your primary care physician will handle any further medical issues. Please note that NO REFILLS for any discharge medications will be authorized once you are discharged, as it is imperative that you  return to your primary care physician (or establish a relationship with a primary care physician if you do not have one) for your aftercare needs so that they can reassess your need for medications and monitor your lab values.  Discharge Instructions   Call MD for:  difficulty breathing, headache or visual disturbances    Complete by:  As directed      Call MD for:  temperature >100.4    Complete by:  As directed      Diet - low sodium  heart healthy    Complete by:  As directed      Discharge instructions    Complete by:  As directed   We'll discharge home with home health. May not drive until cleared by neurologist or primary care physician.     Increase activity slowly    Complete by:  As directed           Current Discharge Medication List    START taking these medications   Details  amLODipine (NORVASC) 10 MG tablet Take 1 tablet (10 mg total) by mouth daily. Qty: 30 tablet, Refills: 0    meclizine (ANTIVERT) 12.5 MG tablet Take 1 tablet (12.5 mg total) by mouth 3 (three) times daily as needed for dizziness. Qty: 30 tablet, Refills: 0      CONTINUE these medications which have CHANGED   Details  losartan (COZAAR) 50 MG tablet Take 1 tablet (50 mg total) by mouth daily. Qty: 30 tablet, Refills: 0      CONTINUE these medications which have NOT CHANGED   Details  acetaminophen (TYLENOL) 500 MG tablet Take 1,000 mg by mouth every 6 (six) hours as needed.    CRESTOR 10 MG tablet take 1 tablet by mouth once daily Qty: 30 tablet, Refills: 5    DULoxetine (CYMBALTA) 60 MG capsule take 1 capsule by mouth once daily Qty: 30 capsule, Refills: 3    hydrALAZINE (APRESOLINE) 50 MG tablet Take 50 mg by mouth 2 (two) times daily.    metoprolol (TOPROL-XL) 100 MG 24 hr tablet Take 1 tablet (100 mg total) by mouth daily. Qty: 30 tablet, Refills: 5    NEXIUM 40 MG capsule take 1 capsule by mouth once daily Qty: 30 capsule, Refills: 10    sucralfate (CARAFATE) 1 G tablet Take 1 tablet (1 g total) by mouth 4 (four) times daily. Qty: 120 tablet, Refills: 11    tamsulosin (FLOMAX) 0.4 MG CAPS capsule Take 0.4 mg by mouth daily.    traMADol (ULTRAM) 50 MG tablet Take 2 tablets (100 mg total) by mouth every 8 (eight) hours as needed for severe pain. Qty: 180 tablet, Refills: 1    VITAMIN D, CHOLECALCIFEROL, PO Take 1,000 mg by mouth.       STOP taking these medications     mupirocin ointment (BACTROBAN) 2 %         Allergies  Allergen Reactions  . Altace [Ramipril] Anaphylaxis  . Levaquin [Levofloxacin In D5w] Hives  . Iohexol Hives     Desc: HIVES    Follow-up Information   Follow up with [facility]. (Physical Therapy for vestibular treatments and actual therapy for knee. )    Contact information:   [address] [place] [address] [phone]        The results of significant diagnostics from this hospitalization (including imaging, microbiology, ancillary and laboratory) are listed below for reference.    Significant Diagnostic Studies: Dg Chest 2 View  06/03/2014   CLINICAL DATA:  Loss of consciousness after fall.  EXAM: CHEST  2 VIEW  COMPARISON:  January 25, 2012.  FINDINGS: The heart size and mediastinal contours are within normal limits. Both lungs are clear. No pneumothorax or pleural effusion is noted. The visualized skeletal structures are unremarkable.  IMPRESSION: No acute cardiopulmonary abnormality seen.   Electronically Signed   By: Sabino Dick M.D.   On: 06/03/2014 22:03   Ct Head Wo Contrast  06/05/2014   CLINICAL DATA:  Followup subdural hematoma  EXAM: CT HEAD WITHOUT CONTRAST  TECHNIQUE: Contiguous axial images were obtained from the base of the skull through the vertex without intravenous contrast.  COMPARISON:  06/03/2014  FINDINGS: The bony calvarium is again intact. Surgical staples are noted in the right posterior parietal scalp  The previously seen subdural hematoma is again identified and has decreased slightly in size from the prior exam. Some parenchymal increased attenuation is noted consistent with hemorrhage. This is also decreased in size now measuring approximately 6 mm in size. No new areas of hemorrhage are noted.  IMPRESSION: Improvement in subdural and parenchymal hemorrhage when compared with the original exam. No new focal abnormality is seen.   Electronically Signed   By: Inez Catalina M.D.   On: 06/05/2014 11:26   Ct Head Wo  Contrast  06/03/2014   CLINICAL DATA:  Patient stood up to open a door, then passed out and fell backwards hitting his head. Patient is taking Lovenox injections. Patient hit his head on a hardwood floor. There was reported seizure-like activity following the fall. Patient has a posterior scalp laceration.  EXAM: CT HEAD WITHOUT CONTRAST  CT CERVICAL SPINE WITHOUT CONTRAST  TECHNIQUE: Multidetector CT imaging of the head and cervical spine was performed following the standard protocol without intravenous contrast. Multiplanar CT image reconstructions of the cervical spine were also generated.  COMPARISON:  Cervical CT, 12/30/2011.  No prior head CT.  FINDINGS: CT HEAD FINDINGS  A small amount of acute hemorrhage extends along the interhemispheric fissure above the corpus callosum, most likely subdural in location. Small amount of adjacent parenchymal hemorrhage is seen in the medial right frontal lobe extending over an area of approximately 13 mm x 13 mm.  No other intracranial hemorrhage.  There are no parenchymal masses or mass effect. The ventricles are normal in configuration. There is age related ventricular and sulcal enlargement. No hydrocephalus.  There is no evidence of an ischemic infarct.  No extra-axial masses.  No skull fracture. There are changes from previous sinus surgery. Minor mucosal thickening lines remaining ethmoid air cells. Mastoid air cells are clear.  A small amount of air lies in the right posterior scalp soft tissues with a small amount of associated hemorrhage reflecting the patient's laceration. No radiopaque foreign body.  CT CERVICAL SPINE FINDINGS  There has been a previous long anterior cervical spine fusion from C3 through C7. Anterior fusion plate and fixation screws are well-seated. There is mature bone graft material across the C3-C4 through C6-C7 disc spaces.  There is a grade 1 anterolisthesis of C7 on T1. Moderate loss of disc height is noted at this level. There are disc  degenerative changes bilaterally also at this level. This is a chronic finding.  There is no fracture or acute finding.  End plate productive change at C5-C6 and C6-C7 indents the ventral thecal sac causing at least mild central stenosis. There are multiple levels of neural foraminal narrowing, greatest bilaterally at C6-C7 severe on  the right and moderate on the left.  Bones are demineralized.  Surrounding soft tissues are unremarkable.  Lung apices are clear.  IMPRESSION: HEAD CT: There is a small amount of extra-axial intracranial hemorrhage along the interhemispheric fissure adjacent to the corpus callosum, likely subdural. There is also a small amount of adjacent parenchymal hemorrhage in the medial right frontal lobe measuring 13 mm size.  No other intracranial hemorrhage.  No skull fracture.  CERVICAL CT: No fracture or acute finding. No evidence of disruption of the anterior fusion hardware.   Electronically Signed   By: Lajean Manes M.D.   On: 06/03/2014 16:00   Ct Cervical Spine Wo Contrast  06/03/2014   CLINICAL DATA:  Patient stood up to open a door, then passed out and fell backwards hitting his head. Patient is taking Lovenox injections. Patient hit his head on a hardwood floor. There was reported seizure-like activity following the fall. Patient has a posterior scalp laceration.  EXAM: CT HEAD WITHOUT CONTRAST  CT CERVICAL SPINE WITHOUT CONTRAST  TECHNIQUE: Multidetector CT imaging of the head and cervical spine was performed following the standard protocol without intravenous contrast. Multiplanar CT image reconstructions of the cervical spine were also generated.  COMPARISON:  Cervical CT, 12/30/2011.  No prior head CT.  FINDINGS: CT HEAD FINDINGS  A small amount of acute hemorrhage extends along the interhemispheric fissure above the corpus callosum, most likely subdural in location. Small amount of adjacent parenchymal hemorrhage is seen in the medial right frontal lobe extending over an area  of approximately 13 mm x 13 mm.  No other intracranial hemorrhage.  There are no parenchymal masses or mass effect. The ventricles are normal in configuration. There is age related ventricular and sulcal enlargement. No hydrocephalus.  There is no evidence of an ischemic infarct.  No extra-axial masses.  No skull fracture. There are changes from previous sinus surgery. Minor mucosal thickening lines remaining ethmoid air cells. Mastoid air cells are clear.  A small amount of air lies in the right posterior scalp soft tissues with a small amount of associated hemorrhage reflecting the patient's laceration. No radiopaque foreign body.  CT CERVICAL SPINE FINDINGS  There has been a previous long anterior cervical spine fusion from C3 through C7. Anterior fusion plate and fixation screws are well-seated. There is mature bone graft material across the C3-C4 through C6-C7 disc spaces.  There is a grade 1 anterolisthesis of C7 on T1. Moderate loss of disc height is noted at this level. There are disc degenerative changes bilaterally also at this level. This is a chronic finding.  There is no fracture or acute finding.  End plate productive change at C5-C6 and C6-C7 indents the ventral thecal sac causing at least mild central stenosis. There are multiple levels of neural foraminal narrowing, greatest bilaterally at C6-C7 severe on the right and moderate on the left.  Bones are demineralized.  Surrounding soft tissues are unremarkable.  Lung apices are clear.  IMPRESSION: HEAD CT: There is a small amount of extra-axial intracranial hemorrhage along the interhemispheric fissure adjacent to the corpus callosum, likely subdural. There is also a small amount of adjacent parenchymal hemorrhage in the medial right frontal lobe measuring 13 mm size.  No other intracranial hemorrhage.  No skull fracture.  CERVICAL CT: No fracture or acute finding. No evidence of disruption of the anterior fusion hardware.   Electronically Signed    By: Lajean Manes M.D.   On: 06/03/2014 16:00   Mr Brain Lottie Dawson  Contrast  06/04/2014   CLINICAL DATA:  Syncopal episode followed by a seizure 06/03/2014. Patient struck head on floor. Patient anticoagulated.  EXAM: MRI HEAD WITHOUT CONTRAST  TECHNIQUE: Multiplanar, multiecho pulse sequences of the brain and surrounding structures were obtained without intravenous contrast.  COMPARISON:  CT head and cervical spine 06/03/2014.  FINDINGS: There is a medial RIGHT frontal parenchymal hemorrhage with adjacent subdural and subarachnoid blood which is likely posttraumatic. I do not see evidence for pericallosal artery aneurysm or parenchymal vascular malformation. As measured on gradient sequence 8 image 132 cross-section of the parenchymal and subdural blood is 10 x 17 mm, which may be slightly increased from CT.  On diffusion-weighted imaging, there is restricted diffusion within the cingulate gyrus and more superior subcortical white matter which may represent associated ischemia within the RIGHT ACA territory. No similar changes on the LEFT.  Generalized atrophy premature for age. Mild subcortical and periventricular T2 and FLAIR hyperintensities, likely chronic microvascular ischemic change. Tiny focus hemorrhage RIGHT parietal subcortical white matter in a linear fashion, likely remote insult. Previous cervical fusion is incompletely evaluated. No midline abnormality of the pituitary and cerebellar tonsils. Flow voids are maintained. No osseous lesion. No acute sinus disease. BILATERAL mastoid fluid similar to CT. RIGHT occipital scalp hematoma.  IMPRESSION: Medial RIGHT frontal acute parenchymal hemorrhage with adjacent subdural and subarachnoid blood consistent with a posttraumatic shearing injury.  Small foci of restricted diffusion within the RIGHT cingulate gyrus and RIGHT frontal subcortical white matter representing probable associated ischemia.  Atrophy and small vessel disease.  No visible pericallosal artery  aneurysm or parenchymal arteriovenous malformation.   Electronically Signed   By: Rolla Flatten M.D.   On: 06/04/2014 11:44    Microbiology: No results found for this or any previous visit (from the past 240 hour(s)).   Labs: Basic Metabolic Panel:  Recent Labs Lab 06/03/14 1528 06/04/14 0420 06/05/14 0400  NA 138 140 137  K 4.0 4.5 4.1  CL 104 106 103  CO2 19 22 23   GLUCOSE 83 101* 122*  BUN 17 23 13   CREATININE 1.12 1.09 0.94  CALCIUM 9.4 9.2 9.0   Liver Function Tests:  Recent Labs Lab 06/04/14 0420  AST 9  ALT 7  ALKPHOS 68  BILITOT 0.2*  PROT 6.2  ALBUMIN 3.0*   No results found for this basename: LIPASE, AMYLASE,  in the last 168 hours No results found for this basename: AMMONIA,  in the last 168 hours CBC:  Recent Labs Lab 06/03/14 1528 06/04/14 0420 06/05/14 0400  WBC 9.7 8.5 7.3  NEUTROABS 7.7 6.2  --   HGB 10.7* 9.6* 9.6*  HCT 32.7* 29.5* 29.1*  MCV 100.3* 97.7 98.6  PLT 371 333 262   Cardiac Enzymes:  Recent Labs Lab 06/03/14 2257 06/04/14 0420 06/04/14 1300  TROPONINI <0.30 <0.30 <0.30   BNP: BNP (last 3 results) No results found for this basename: PROBNP,  in the last 8760 hours CBG:  Recent Labs Lab 06/06/14 1135 06/06/14 1641 06/06/14 2115 06/07/14 0729 06/07/14 1132  GLUCAP 92 100* 130* 101* 92       Signed:  Velvet Bathe  [facility] 06/07/2014, 6:02 PM

## 2014-06-07 NOTE — Care Management Note (Signed)
    Page 1 of 1   06/07/2014     11:59:48 AM CARE MANAGEMENT NOTE 06/07/2014  Patient:  Justin Patel, Justin Patel   Account Number:  [id number]  Date Initiated:  06/07/2014  Documentation initiated by:  GRAVES-BIGELOW,Daniela Hernan  Subjective/Objective Assessment:   Pt admitted for syncope and fall. Pt is form home.     Action/Plan:   Plan to return home with [facility] services once medically stable. CM will make referral for [facility]. SOC to begin Tuesday of next week.   Anticipated DC Date:  06/08/2014   Anticipated DC Plan:  [place]  CM consult      [facility] Choice  HOME HEALTH   Choice offered to / List presented to:  C-1 Patient        [place] arranged  HH-2 PT      [place]   Status of service:  Completed, signed off Medicare Important Message given?  NO (If response is "NO", the following Medicare IM given date fields will be blank) Date Medicare IM given:   Medicare IM given by:   Date Additional Medicare IM given:   Additional Medicare IM given by:    Discharge Disposition:  [place]  Per UR Regulation:  Reviewed for med. necessity/level of care/duration of stay  If discussed at [place] of Stay Meetings, dates discussed:    Comments:  06-07-14 Craig, RN,BSN [phone] Pt will need HH orders for services once ready for d/c.

## 2014-06-09 ENCOUNTER — Ambulatory Visit: Payer: Self-pay | Admitting: Internal Medicine

## 2014-06-13 ENCOUNTER — Telehealth: Payer: Self-pay | Admitting: *Deleted

## 2014-06-13 ENCOUNTER — Encounter: Payer: Self-pay | Admitting: Internal Medicine

## 2014-06-13 ENCOUNTER — Ambulatory Visit (INDEPENDENT_AMBULATORY_CARE_PROVIDER_SITE_OTHER): Payer: BC Managed Care – PPO | Admitting: Internal Medicine

## 2014-06-13 VITALS — BP 100/60 | HR 65 | Temp 98.1°F | Ht 67.0 in | Wt 188.5 lb

## 2014-06-13 DIAGNOSIS — D6489 Other specified anemias: Secondary | ICD-10-CM

## 2014-06-13 DIAGNOSIS — I6200 Nontraumatic subdural hemorrhage, unspecified: Secondary | ICD-10-CM

## 2014-06-13 DIAGNOSIS — R55 Syncope and collapse: Secondary | ICD-10-CM

## 2014-06-13 DIAGNOSIS — Z23 Encounter for immunization: Secondary | ICD-10-CM

## 2014-06-13 DIAGNOSIS — S065X9A Traumatic subdural hemorrhage with loss of consciousness of unspecified duration, initial encounter: Secondary | ICD-10-CM

## 2014-06-13 DIAGNOSIS — S06340D Traumatic hemorrhage of right cerebrum without loss of consciousness, subsequent encounter: Secondary | ICD-10-CM

## 2014-06-13 DIAGNOSIS — S065XAA Traumatic subdural hemorrhage with loss of consciousness status unknown, initial encounter: Secondary | ICD-10-CM

## 2014-06-13 MED ORDER — AMLODIPINE BESYLATE 5 MG PO TABS: 5.0000 mg | ORAL_TABLET | Freq: Every day | ORAL | Status: DC

## 2014-06-13 MED ORDER — TRAMADOL HCL 50 MG PO TABS: 100.0000 mg | ORAL_TABLET | Freq: Three times a day (TID) | ORAL | Status: DC | PRN

## 2014-06-13 NOTE — Assessment & Plan Note (Signed)
S/p recent SDH hematoma after witnessed syncopal event. Hold aspirin. Referral placed for follow up with neurology.Reviewed CT and MRI imaging with pt today.

## 2014-06-13 NOTE — Assessment & Plan Note (Signed)
Reviewed notes and CT/MRI imaging with pt today. Hold aspirin.

## 2014-06-13 NOTE — Telephone Encounter (Signed)
Fine for PT

## 2014-06-13 NOTE — Telephone Encounter (Signed)
Notified Claiborne Billings at [employer]

## 2014-06-13 NOTE — Patient Instructions (Signed)
Decrease Amlodipine to 5mg  daily. Monitor blood pressure and home and email with readings.  Follow up 4 weeks.

## 2014-06-13 NOTE — Assessment & Plan Note (Signed)
Recent episode of syncope at home. Likely related to hypotension and anemia. Reviewed MRI brain and EEG, as well as neurology notes with pt today. Will set up follow up with neurology. Will also recheck blood counts Monday with Dr. Cynda Acres in hematology. Will decrease dose of Amlodpine to 5mg  given hypotension . Follow up here in 4 weeks and prn.

## 2014-06-13 NOTE — Progress Notes (Signed)
Pre visit review using our clinic review tool, if applicable. No additional management support is needed unless otherwise documented below in the visit note. 

## 2014-06-13 NOTE — Telephone Encounter (Signed)
[facility] called requesting verbal order for Physical Therapy.

## 2014-06-13 NOTE — Progress Notes (Signed)
Subjective:    Patient ID: Justin Patel, male    DOB: 01-08-1951, 63 y.o.   MRN: [id number]  HPI 63YO male presents for hospital follow up.  Admitted 06/03/2014 Discharged 06/07/2014  Diagnosis: SDH, syncope, fall, intracerebral hemorrhage  Had TKR left leg on 10/7. Did well post-op. Was on Lovenox for DVT prophylaxis. Plan was to continue for 14 days. Discharged from this hospitalization on 10/10.  At home, bumped head when trying to get into car 10/23, Friday. No LOC. Did not seek care. Saturday, took last Lovenox. Sunday, stumbled in home and hit right ear on chair in dining room. Monday, went to answer door to let in [name]. Does not recall doing this. Glass worker witnessed fall backward at door. Head reportedly bounced on floor. Question of seizure activity. Taken by EMS to [facility]. Found to have SDH and laceration on posterior scalp. Staples placed. Work up including neurology evaluation all normal. EEG normal. MRI brain normal except for SDH.  Feeling well since discharge. No further lightheadedness, syncope, weakness. Has been active and walking daily. Would like to have stable removed today.  Noted to be anemic after recent knee replacement. Has follow up with Dr. Cynda Acres on Monday to recheck counts.  Review of Systems  Constitutional: Negative for fever, chills, activity change, appetite change, fatigue and unexpected weight change.  Eyes: Negative for visual disturbance.  Respiratory: Negative for cough and shortness of breath.   Cardiovascular: Negative for chest pain, palpitations and leg swelling.  Gastrointestinal: Negative for nausea, vomiting, abdominal pain, diarrhea, constipation, blood in stool and abdominal distention.  Genitourinary: Negative for dysuria, urgency and difficulty urinating.  Musculoskeletal: Negative for arthralgias and gait problem.  Skin: Positive for wound. Negative for color change and rash.  Neurological: Negative for  dizziness, tremors, seizures, syncope (none after recent event), facial asymmetry, speech difficulty, weakness, light-headedness, numbness and headaches.  Hematological: Negative for adenopathy.  Psychiatric/Behavioral: Negative for sleep disturbance and dysphoric mood. The patient is not nervous/anxious.        Objective:    BP 100/60 mmHg  Pulse 65  Temp(Src) 98.1 F (36.7 C) (Oral)  Ht 5\' 7"  (1.702 m)  Wt 188 lb 8 oz (85.503 kg)  BMI 29.52 kg/m2  SpO2 97% Physical Exam  Constitutional: He is oriented to person, place, and time. He appears well-developed and well-nourished. No distress.  HENT:  Head: Normocephalic and atraumatic.    Right Ear: External ear normal.  Left Ear: External ear normal.  Nose: Nose normal.  Mouth/Throat: Oropharynx is clear and moist. No oropharyngeal exudate.  Eyes: Conjunctivae and EOM are normal. Pupils are equal, round, and reactive to light. Right eye exhibits no discharge. Left eye exhibits no discharge. No scleral icterus.  Neck: Normal range of motion. Neck supple. No tracheal deviation present. No thyromegaly present.  Cardiovascular: Normal rate, regular rhythm and normal heart sounds.  Exam reveals no gallop and no friction rub.   No murmur heard. Pulmonary/Chest: Effort normal and breath sounds normal. No accessory muscle usage. No tachypnea. No respiratory distress. He has no decreased breath sounds. He has no wheezes. He has no rhonchi. He has no rales. He exhibits no tenderness.  Musculoskeletal: Normal range of motion. He exhibits no edema.       Legs: Lymphadenopathy:    He has no cervical adenopathy.  Neurological: He is alert and oriented to person, place, and time. No cranial nerve deficit. Coordination normal.  Skin: Skin is warm and dry. No  rash noted. He is not diaphoretic. No erythema. No pallor.  Psychiatric: He has a normal mood and affect. His behavior is normal. Judgment and thought content normal.          Assessment  & Plan:  Over 41min of which >50% spent in face-to-face contact with patient discussing plan of care  Problem List Items Addressed This Visit      Unprioritized   Anemia   Intracerebral hemorrhage    Reviewed notes and CT/MRI imaging with pt today. Hold aspirin.    Relevant Orders      Ambulatory referral to Neurology   SDH (subdural hematoma) - Primary    S/p recent SDH hematoma after witnessed syncopal event. Hold aspirin. Referral placed for follow up with neurology.Reviewed CT and MRI imaging with pt today.     Relevant Orders      Ambulatory referral to Neurology   Syncope    Recent episode of syncope at home. Likely related to hypotension and anemia. Reviewed MRI brain and EEG, as well as neurology notes with pt today. Will set up follow up with neurology. Will also recheck blood counts Monday with Dr. Cynda Acres in hematology. Will decrease dose of Amlodpine to 5mg  given hypotension . Follow up here in 4 weeks and prn.    Relevant Medications      amLODIpine (NORVASC) tablet    Other Visit Diagnoses    Need for vaccination with 13-polyvalent pneumococcal conjugate vaccine        Relevant Orders       Pneumococcal conjugate vaccine 13-valent (Completed)        Return in about 4 weeks (around 07/11/2014) for Recheck.

## 2014-06-17 LAB — CBC CANCER CENTER
BASOS PCT: 0.8 %
Basophil #: 0.1 x10 3/mm (ref 0.0–0.1)
EOS ABS: 0.1 x10 3/mm (ref 0.0–0.7)
Eosinophil %: 1.5 %
HCT: 35.7 % — ABNORMAL LOW (ref 40.0–52.0)
HGB: 11.7 g/dL — AB (ref 13.0–18.0)
LYMPHS ABS: 1.7 x10 3/mm (ref 1.0–3.6)
LYMPHS PCT: 23.9 %
MCH: 32.7 pg (ref 26.0–34.0)
MCHC: 32.6 g/dL (ref 32.0–36.0)
MCV: 100 fL (ref 80–100)
Monocyte #: 0.4 x10 3/mm (ref 0.2–1.0)
Monocyte %: 5.4 %
NEUTROS ABS: 4.7 x10 3/mm (ref 1.4–6.5)
Neutrophil %: 68.4 %
Platelet: 256 x10 3/mm (ref 150–440)
RBC: 3.56 10*6/uL — ABNORMAL LOW (ref 4.40–5.90)
RDW: 14.4 % (ref 11.5–14.5)
WBC: 6.9 x10 3/mm (ref 3.8–10.6)

## 2014-06-17 LAB — FERRITIN: Ferritin (ARMC): 92 ng/mL (ref 8–388)

## 2014-06-20 ENCOUNTER — Other Ambulatory Visit: Payer: Self-pay | Admitting: Internal Medicine

## 2014-06-22 ENCOUNTER — Other Ambulatory Visit: Payer: Self-pay | Admitting: Internal Medicine

## 2014-07-09 ENCOUNTER — Ambulatory Visit: Payer: Self-pay | Admitting: Internal Medicine

## 2014-07-10 ENCOUNTER — Ambulatory Visit: Payer: BC Managed Care – PPO | Admitting: Internal Medicine

## 2014-07-15 LAB — CANCER CENTER HEMOGLOBIN: HGB: 11 g/dL — ABNORMAL LOW (ref 13.0–18.0)

## 2014-07-15 LAB — FERRITIN: Ferritin (ARMC): 94 ng/mL (ref 8–388)

## 2014-07-18 ENCOUNTER — Ambulatory Visit: Payer: BC Managed Care – PPO | Admitting: Internal Medicine

## 2014-07-19 ENCOUNTER — Encounter: Payer: Self-pay | Admitting: Internal Medicine

## 2014-07-19 ENCOUNTER — Ambulatory Visit (INDEPENDENT_AMBULATORY_CARE_PROVIDER_SITE_OTHER): Payer: BC Managed Care – PPO | Admitting: Internal Medicine

## 2014-07-19 VITALS — BP 128/80 | HR 60 | Temp 98.7°F | Resp 14 | Wt 190.5 lb

## 2014-07-19 DIAGNOSIS — R55 Syncope and collapse: Secondary | ICD-10-CM

## 2014-07-19 NOTE — Progress Notes (Signed)
Subjective:    Patient ID: Justin Patel, male    DOB: Oct 09, 1950, 63 y.o.   MRN: [id number]  HPI 63YO male presents for acute visit.  Fell yesterday when taking garbage out. Did not trip. Head suddenly felt heavy and he felt tightness in his chest. Felt slightly short of breath. No diarphoresis or nausea. No LOC. No injury. Feels that right neck is more swollen recently. Neck is sore to touch. This has been present for several months. Had a fever earlier this week 100-101F. No other URI symptoms. No congestion, cough, sore throat.   Today, denies chest pain or tightness. Occasional shortness of breath.   Past medical, surgical, family and social history per today's encounter.  Review of Systems  Constitutional: Positive for fever. Negative for chills, activity change, appetite change, fatigue and unexpected weight change.  Eyes: Negative for visual disturbance.  Respiratory: Positive for chest tightness and shortness of breath. Negative for cough.   Cardiovascular: Negative for chest pain, palpitations and leg swelling.  Gastrointestinal: Negative for abdominal pain and abdominal distention.  Genitourinary: Negative for dysuria, urgency and difficulty urinating.  Musculoskeletal: Positive for neck pain. Negative for arthralgias, gait problem and neck stiffness.  Skin: Positive for color change. Negative for rash.  Neurological: Positive for syncope and weakness.  Hematological: Negative for adenopathy.  Psychiatric/Behavioral: Negative for sleep disturbance and dysphoric mood. The patient is not nervous/anxious.        Objective:    BP 128/80 mmHg  Pulse 60  Temp(Src) 98.7 F (37.1 C) (Oral)  Resp 14  Wt 190 lb 8 oz (86.41 kg)  SpO2 97% Physical Exam  Constitutional: He is oriented to person, place, and time. He appears well-developed and well-nourished. No distress.  HENT:  Head: Normocephalic and atraumatic.  Right Ear: External ear normal.  Left Ear: External ear  normal.  Nose: Nose normal.  Mouth/Throat: Oropharynx is clear and moist. No oropharyngeal exudate.  Eyes: Conjunctivae and EOM are normal. Pupils are equal, round, and reactive to light. Right eye exhibits no discharge. Left eye exhibits no discharge. No scleral icterus.  Neck: Normal range of motion. Neck supple. Carotid bruit is not present. No tracheal deviation present. No thyromegaly present.    Cardiovascular: Normal rate, regular rhythm and normal heart sounds.  Exam reveals no gallop and no friction rub.   No murmur heard. Pulmonary/Chest: Effort normal and breath sounds normal. No accessory muscle usage. No tachypnea. No respiratory distress. He has no decreased breath sounds. He has no wheezes. He has no rhonchi. He has no rales. He exhibits no tenderness.  Musculoskeletal: Normal range of motion. He exhibits no edema.  Lymphadenopathy:    He has no cervical adenopathy.  Neurological: He is alert and oriented to person, place, and time. No cranial nerve deficit. Coordination normal.  Skin: Skin is warm and dry. No rash noted. He is not diaphoretic. No erythema. No pallor.  Psychiatric: He has a normal mood and affect. His behavior is normal. Judgment and thought content normal.          Assessment & Plan:  Over 64min of which >50% spent in face-to-face contact with patient discussing plan of care   Problem List Items Addressed This Visit      Unprioritized   Syncope - Primary    Recent episodes of pre-syncope or syncope, most recently yesterday. No LOC or injury. EKG normal today. Will get carotid dopplers.Will check CBC and CMP. Will set up cardiology evaluation. Question if  syncopal episodes may be related to arrhythmia. Question if Holter monitor might be helpful. Follow up in 2 weeks.    Relevant Orders      CBC with Differential      Comprehensive metabolic panel      EKG 50-ZTAE (Completed)      US Carotid Duplex Bilateral      Ambulatory referral to Cardiology         Return in about 2 weeks (around 08/02/2014) for Recheck.

## 2014-07-19 NOTE — Progress Notes (Signed)
Pre visit review using our clinic review tool, if applicable. No additional management support is needed unless otherwise documented below in the visit note. 

## 2014-07-19 NOTE — Assessment & Plan Note (Addendum)
Recent episodes of pre-syncope or syncope, most recently yesterday. No LOC or injury. EKG normal today. Will get carotid dopplers.Will check CBC and CMP. Will set up cardiology evaluation. Question if syncopal episodes may be related to arrhythmia. Question if Holter monitor might be helpful. Follow up in 2 weeks.

## 2014-07-19 NOTE — Patient Instructions (Signed)
We will set up a carotid doppler.  Labs today.  Follow up in 2 weeks.

## 2014-07-20 LAB — CBC WITH DIFFERENTIAL/PLATELET
BASOS ABS: 0 10*3/uL (ref 0.0–0.1)
Basophils Relative: 0 % (ref 0–1)
Eosinophils Absolute: 0.1 10*3/uL (ref 0.0–0.7)
Eosinophils Relative: 1 % (ref 0–5)
HEMATOCRIT: 33.1 % — AB (ref 39.0–52.0)
HEMOGLOBIN: 10.6 g/dL — AB (ref 13.0–17.0)
Lymphocytes Relative: 20 % (ref 12–46)
Lymphs Abs: 1.4 10*3/uL (ref 0.7–4.0)
MCH: 31.8 pg (ref 26.0–34.0)
MCHC: 32 g/dL (ref 30.0–36.0)
MCV: 99.4 fL (ref 78.0–100.0)
MONOS PCT: 10 % (ref 3–12)
MPV: 9.9 fL (ref 9.4–12.4)
Monocytes Absolute: 0.7 10*3/uL (ref 0.1–1.0)
NEUTROS ABS: 4.8 10*3/uL (ref 1.7–7.7)
NEUTROS PCT: 69 % (ref 43–77)
Platelets: 209 10*3/uL (ref 150–400)
RBC: 3.33 MIL/uL — ABNORMAL LOW (ref 4.22–5.81)
RDW: 14.7 % (ref 11.5–15.5)
WBC: 6.9 10*3/uL (ref 4.0–10.5)

## 2014-07-20 LAB — COMPREHENSIVE METABOLIC PANEL
ALBUMIN: 3.5 g/dL (ref 3.5–5.2)
ALT: 12 U/L (ref 0–53)
AST: 13 U/L (ref 0–37)
Alkaline Phosphatase: 69 U/L (ref 39–117)
BILIRUBIN TOTAL: 0.3 mg/dL (ref 0.2–1.2)
BUN: 17 mg/dL (ref 6–23)
CO2: 28 mEq/L (ref 19–32)
Calcium: 9.2 mg/dL (ref 8.4–10.5)
Chloride: 104 mEq/L (ref 96–112)
Creat: 0.92 mg/dL (ref 0.50–1.35)
GLUCOSE: 137 mg/dL — AB (ref 70–99)
POTASSIUM: 4.2 meq/L (ref 3.5–5.3)
SODIUM: 141 meq/L (ref 135–145)
TOTAL PROTEIN: 6 g/dL (ref 6.0–8.3)

## 2014-07-22 ENCOUNTER — Encounter: Payer: Self-pay | Admitting: *Deleted

## 2014-07-22 ENCOUNTER — Encounter: Payer: Self-pay | Admitting: Internal Medicine

## 2014-07-29 LAB — CBC CANCER CENTER
BASOS ABS: 0.1 x10 3/mm (ref 0.0–0.1)
Basophil %: 0.9 %
EOS ABS: 0.1 x10 3/mm (ref 0.0–0.7)
EOS PCT: 1.7 %
HCT: 36.9 % — AB (ref 40.0–52.0)
HGB: 11.8 g/dL — ABNORMAL LOW (ref 13.0–18.0)
Lymphocyte #: 1.8 x10 3/mm (ref 1.0–3.6)
Lymphocyte %: 24.3 %
MCH: 31.5 pg (ref 26.0–34.0)
MCHC: 32.1 g/dL (ref 32.0–36.0)
MCV: 98 fL (ref 80–100)
MONO ABS: 0.5 x10 3/mm (ref 0.2–1.0)
MONOS PCT: 6.5 %
NEUTROS ABS: 5 x10 3/mm (ref 1.4–6.5)
NEUTROS PCT: 66.6 %
Platelet: 228 x10 3/mm (ref 150–440)
RBC: 3.75 10*6/uL — AB (ref 4.40–5.90)
RDW: 15 % — AB (ref 11.5–14.5)
WBC: 7.5 x10 3/mm (ref 3.8–10.6)

## 2014-07-31 ENCOUNTER — Encounter: Payer: Self-pay | Admitting: Neurology

## 2014-07-31 ENCOUNTER — Ambulatory Visit (INDEPENDENT_AMBULATORY_CARE_PROVIDER_SITE_OTHER): Payer: BC Managed Care – PPO | Admitting: Neurology

## 2014-07-31 VITALS — BP 134/76 | HR 76 | Temp 98.4°F | Resp 18 | Ht 67.0 in | Wt 192.5 lb

## 2014-07-31 DIAGNOSIS — S065X9A Traumatic subdural hemorrhage with loss of consciousness of unspecified duration, initial encounter: Secondary | ICD-10-CM

## 2014-07-31 DIAGNOSIS — S065XAA Traumatic subdural hemorrhage with loss of consciousness status unknown, initial encounter: Secondary | ICD-10-CM

## 2014-07-31 DIAGNOSIS — R404 Transient alteration of awareness: Secondary | ICD-10-CM

## 2014-07-31 DIAGNOSIS — I6200 Nontraumatic subdural hemorrhage, unspecified: Secondary | ICD-10-CM

## 2014-07-31 MED ORDER — LEVETIRACETAM 500 MG PO TABS: 500.0000 mg | ORAL_TABLET | Freq: Two times a day (BID) | ORAL | Status: DC

## 2014-07-31 NOTE — Patient Instructions (Signed)
1.  Since you have had recurrent episodes of loss of consciousness, a seizure is strongly suspected.  Therefore, we will start Keppra 500mg  twice daily as protection from seizures. 2.  No driving 3.  We will get 24 hour ambulatory EEG 4.  Follow up in April (after 4/26)

## 2014-07-31 NOTE — Progress Notes (Addendum)
NEUROLOGY CONSULTATION NOTE  [name] MRN: [id number] DOB: 23-Oct-1950  Referring provider: Dr. Gilford Rile Primary care provider: Dr. Gilford Rile  Reason for consult:  Subdural hematoma  HISTORY OF PRESENT ILLNESS: Justin Patel is a 63 year old right-handed man with type II diabetes, hypertension, status post gastric bypass, CAD status post catheterization and history of left TKR and GI leed who presents for recent subdural hematoma.  Records, labs and images (MRI and CT) personally reviewed.  He was on lovenox following left total knee replacement on 05/14/14.  He passed out and fell on 06/03/14, hitting the back of his head.  He was waiting for the repairman.  The last thing he remembers was getting a call from the repairman saying he will be at the house in 10 minutes.  Then next thing he remembers was waking up in [facility].  Reportedly, when he answered the door, the repairman said he looked strange and his body became rigid and just fell backwards.  CT of the head showed a small subdural hematoma along the right interhemispheric fissure.  MRI of the brain showed post-traumatic shearing injury presenting as medial right frontal acute parenchymal hemorrhage with adjacent subdural and subarachnoid blood.  It also revealed associated ischemia in the right frontal subcortical white matter, due to injury.  2D echo LVEF 55-60%.  EEG was normal.  Anticoagulation was held.  CT of the head performed on 06/05/14 showed interval improvement in the bleed.  Since hospital admission, he reports aching sensation and numbness over the right side of his head.  He also feels a lump on the right side of his neck.  His PCP ordered a carotid doppler which is scheduled.  He reports that he has had falls since the incident.  The other day, he took out the garbage and when he turned, his legs gave out.  He reports prior history of falls going back to 2013.  He associates it with feel off-balanced.  He has  fractured his coccyx and sacrum as a result.  There is a small degree of dizziness and lightheadedness as well.  It is not associated with rising from a chair.  He says that some of these episodes were associated with loss of consciousness.  About 6 years ago, he had a bleed following gastric bypass surgery.  He had passed out and fell in the bathtub.  He had voided his bowls.  EMS told him he had a seizure.  He also reports an episode of transient global amnesia in 2012.  PAST MEDICAL HISTORY: Past Medical History  Diagnosis Date  . Hypertension   . Anxiety   . Sleep apnea     hx not now since wt loss  . Diabetes mellitus   . Stones in the urinary tract   . GERD (gastroesophageal reflux disease)   . H/O hiatal hernia   . Headache(784.0)   . Arthritis   . 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 Gastric ulcer   . Degenerative disc disease   . Sacral fracture, closed     PAST SURGICAL HISTORY: Past Surgical History  Procedure Laterality Date  . Cardiac catheterization      [facility], normal  . Hernia repair  2000    hiatal  . Gastric bypass  2010    [facility]  . Knee arthroscopy      bilateral, left x 2  . Osteotomy  2001    left  .  Tonsillectomy    . Appendectomy    . Nasal sinus surgery      x5  . Uvulopalatoplasty  2011  . Anterior cervical decomp/discectomy fusion  01/26/2012    Procedure: ANTERIOR CERVICAL DECOMPRESSION/DISCECTOMY FUSION 3 LEVELS;  Surgeon: Floyce Stakes, MD;  Location: [facility];  Service: Neurosurgery;  Laterality: N/A;  Cervical three-four Cervical four-five Cervical five-six Cervical six-seven , Anterior cervical decompression/diskectomy, fusion, plate  . Total knee arthroplasty Left 05/2014    Dr. Marry Guan    MEDICATIONS: Current Outpatient Prescriptions on File Prior to Visit  Medication Sig Dispense Refill  . acetaminophen (TYLENOL) 500 MG tablet Take 1,000 mg by mouth every 6 (six) hours as needed.    Marland Kitchen  amLODipine (NORVASC) 5 MG tablet Take 1 tablet (5 mg total) by mouth daily. 90 tablet 3  . CRESTOR 10 MG tablet take 1 tablet by mouth once daily 30 tablet 5  . hydrALAZINE (APRESOLINE) 50 MG tablet Take 50 mg by mouth 2 (two) times daily.    Marland Kitchen losartan (COZAAR) 50 MG tablet Take 1 tablet (50 mg total) by mouth daily. 30 tablet 0  . metoprolol (TOPROL-XL) 100 MG 24 hr tablet Take 1 tablet (100 mg total) by mouth daily. 30 tablet 5  . NEXIUM 40 MG capsule take 1 capsule by mouth once daily 30 capsule 10  . sucralfate (CARAFATE) 1 G tablet Take 1 tablet (1 g total) by mouth 4 (four) times daily. 120 tablet 11  . tamsulosin (FLOMAX) 0.4 MG CAPS capsule Take 0.4 mg by mouth daily.    . traMADol (ULTRAM) 50 MG tablet Take 2 tablets (100 mg total) by mouth every 8 (eight) hours as needed for severe pain. 180 tablet 2  . VITAMIN D, CHOLECALCIFEROL, PO Take 1,000 mg by mouth.     . DULoxetine (CYMBALTA) 60 MG capsule take 1 capsule by mouth once daily (Patient not taking: Reported on 07/31/2014) 30 capsule 3  . meclizine (ANTIVERT) 12.5 MG tablet Take 1 tablet (12.5 mg total) by mouth 3 (three) times daily as needed for dizziness. (Patient not taking: Reported on 07/31/2014) 30 tablet 0   No current facility-administered medications on file prior to visit.    ALLERGIES: Allergies  Allergen Reactions  . Altace [Ramipril] Anaphylaxis  . Levaquin [Levofloxacin In D5w] Hives  . Iohexol Hives     Desc: HIVES     FAMILY HISTORY: Family History  Problem Relation Age of Onset  . Dementia Mother 58  . Heart disease Father 46  . Diabetes Father   . Cancer Sister     lymphoma, stage 4  . Cancer Sister 42    Ovarian  . Lupus Sister     SOCIAL HISTORY: History   Social History  . Marital Status: Married    Spouse Name: N/A    Number of Children: N/A  . Years of Education: N/A   Occupational History  . Not on file.   Social History Main Topics  . Smoking status: Never Smoker   .  Smokeless tobacco: Never Used  . Alcohol Use: 0.6 oz/week    1 Glasses of wine per week     Comment: occasional  . Drug Use: No  . Sexual Activity:    Partners: Female   Other Topics Concern  . Not on file   Social History Narrative   Lives in [place] with wife, Bennett. 2 sons, William Hamburger, Lennette Bihari 57.. 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      Diet - regular diet, limited quantities after gastric bypass      Exercise - no regular, limited by arthritis in knees, occasional water aerobics    REVIEW OF SYSTEMS: Constitutional: No fevers, chills, or sweats, no generalized fatigue, change in appetite Eyes: No visual changes, double vision, eye pain Ear, nose and throat: No hearing loss, ear pain, nasal congestion, sore throat Cardiovascular: No chest pain, palpitations Respiratory:  No shortness of breath at rest or with exertion, wheezes GastrointestinaI: No nausea, vomiting, diarrhea, abdominal pain, fecal incontinence Genitourinary:  No dysuria, urinary retention or frequency Musculoskeletal:  No neck pain, back pain Integumentary: No rash, pruritus, skin lesions Neurological: as above Psychiatric: No depression, insomnia, anxiety Endocrine: No palpitations, fatigue, diaphoresis, mood swings, change in appetite, change in weight, increased thirst Hematologic/Lymphatic:  No anemia, purpura, petechiae. Allergic/Immunologic: no itchy/runny eyes, nasal congestion, recent allergic reactions, rashes  PHYSICAL EXAM: Filed Vitals:   07/31/14 1033  BP: 134/76  Pulse: 76  Temp: 98.4 F (36.9 C)  Resp: 18   General: No acute distress Head:  Normocephalic/atraumatic Eyes:  fundi unremarkable, without vessel changes, exudates, hemorrhages or papilledema. Neck: supple, no paraspinal tenderness, full range of motion.  No mass appreciated. Back: No paraspinal tenderness Heart: regular rate and rhythm Lungs: Clear to auscultation  bilaterally. Vascular: No carotid bruits. Neurological Exam: Mental status: alert and oriented to person, place, and time, recent and remote memory intact, fund of knowledge intact, attention and concentration intact, speech fluent and not dysarthric, language intact. Cranial nerves: CN I: not tested CN II: pupils equal, round and reactive to light, visual fields intact, fundi unremarkable, without vessel changes, exudates, hemorrhages or papilledema. CN III, IV, VI:  full range of motion, no nystagmus, no ptosis CN V: reduced right V1 CN VII: upper and lower face symmetric CN VIII: hearing intact CN IX, X: gag intact, uvula midline CN XI: sternocleidomastoid and trapezius muscles intact CN XII: tongue midline Bulk & Tone: normal, no fasciculations. Motor:  5/5 throughout Sensation:  Temperature and vibration intact Deep Tendon Reflexes:  2+ throughout, toes downgoing Finger to nose testing:  Mild postural and kinetic tremor.  No dysmetria Heel to shin:  Mild dysmetria bilaterally Gait:  Normal station and stride.  Able to turn but cannot tandem walk. Romberg with sway.  IMPRESSION: Post-traumatic subdural hematoma Recurrent episodes of transient altered awareness.  Suspicion for seizures.  PLAN: 1.  Will start Keppra 500mg  twice daily. 2.  Will get 24 hour ambulatory EEG 3.  No driving for six months from last episode of altered awareness, as per [place] law. 4.  Follow up in April (after the 26).  Thank you for allowing me to take part in the care of this patient.  Metta Clines, DO  CC:  Ronette Deter, MD

## 2014-08-08 ENCOUNTER — Ambulatory Visit: Payer: Self-pay | Admitting: Internal Medicine

## 2014-08-08 ENCOUNTER — Telehealth: Payer: Self-pay | Admitting: Internal Medicine

## 2014-08-08 ENCOUNTER — Ambulatory Visit: Payer: BC Managed Care – PPO | Admitting: Internal Medicine

## 2014-08-08 ENCOUNTER — Ambulatory Visit (INDEPENDENT_AMBULATORY_CARE_PROVIDER_SITE_OTHER): Payer: BC Managed Care – PPO | Admitting: Internal Medicine

## 2014-08-08 ENCOUNTER — Ambulatory Visit (INDEPENDENT_AMBULATORY_CARE_PROVIDER_SITE_OTHER): Payer: BC Managed Care – PPO | Admitting: Cardiovascular Disease

## 2014-08-08 ENCOUNTER — Encounter: Payer: Self-pay | Admitting: Cardiovascular Disease

## 2014-08-08 ENCOUNTER — Encounter: Payer: Self-pay | Admitting: Internal Medicine

## 2014-08-08 VITALS — BP 122/80 | HR 63 | Temp 98.5°F | Ht 67.0 in | Wt 192.2 lb

## 2014-08-08 VITALS — BP 131/92 | HR 58 | Ht 67.5 in | Wt 191.5 lb

## 2014-08-08 DIAGNOSIS — I10 Essential (primary) hypertension: Secondary | ICD-10-CM

## 2014-08-08 DIAGNOSIS — R519 Headache, unspecified: Secondary | ICD-10-CM | POA: Insufficient documentation

## 2014-08-08 DIAGNOSIS — R55 Syncope and collapse: Secondary | ICD-10-CM

## 2014-08-08 DIAGNOSIS — R51 Headache: Secondary | ICD-10-CM

## 2014-08-08 MED ORDER — HYDROCODONE-ACETAMINOPHEN 5-325 MG PO TABS: 1.0000 | ORAL_TABLET | Freq: Three times a day (TID) | ORAL | Status: DC | PRN

## 2014-08-08 MED ORDER — METOPROLOL SUCCINATE ER 50 MG PO TB24: 50.0000 mg | ORAL_TABLET | Freq: Every day | ORAL | Status: DC

## 2014-08-08 NOTE — Addendum Note (Signed)
Addended by: Ronette Deter A on: 08/08/2014 12:43 PM   Modules accepted: Orders

## 2014-08-08 NOTE — Progress Notes (Signed)
Subjective:    Patient ID: Justin Patel, male    DOB: 15-Sep-1950, 63 y.o.   MRN: [id number]  HPI 63YO male presents for follow up.  Recently seen by Neurology. Concern for possible seizure episodes leading to syncope. Driving was restricted. Started on Keppra.  Tuesday night felt lightheaded and may have blacked out briefly, approx 1-17min. He was lying in bed and did not fall. Ever since that time, he has felt lightheaded and "off." No blurred vision.  Notes left sided temporal headache, described as dull. Not taking anything for this. Has history of intracranial hemorrhage.  Past medical, surgical, family and social history per today's encounter.  Review of Systems  Constitutional: Negative for fever, chills, activity change, appetite change, fatigue and unexpected weight change.  Eyes: Negative for visual disturbance.  Respiratory: Negative for cough and shortness of breath.   Cardiovascular: Negative for chest pain, palpitations and leg swelling.  Gastrointestinal: Negative for abdominal pain and abdominal distention.  Genitourinary: Negative for dysuria, urgency and difficulty urinating.  Musculoskeletal: Negative for arthralgias and gait problem.  Skin: Negative for color change and rash.  Neurological: Positive for dizziness, syncope, light-headedness and headaches. Negative for tremors, speech difficulty, weakness and numbness.  Hematological: Negative for adenopathy.  Psychiatric/Behavioral: Negative for sleep disturbance and dysphoric mood. The patient is not nervous/anxious.        Objective:    BP 122/80 mmHg  Pulse 63  Temp(Src) 98.5 F (36.9 C) (Oral)  Ht 5\' 7"  (1.702 m)  Wt 192 lb 4 oz (87.204 kg)  BMI 30.10 kg/m2  SpO2 99% Physical Exam  Constitutional: He is oriented to person, place, and time. He appears well-developed and well-nourished. No distress.  HENT:  Head: Normocephalic and atraumatic.  Right Ear: External ear normal.  Left Ear: External ear  normal.  Nose: Nose normal.  Mouth/Throat: Oropharynx is clear and moist. No oropharyngeal exudate.  Eyes: Conjunctivae and EOM are normal. Pupils are equal, round, and reactive to light. Right eye exhibits no discharge. Left eye exhibits no discharge. No scleral icterus.  Neck: Normal range of motion. Neck supple. No tracheal deviation present. No thyromegaly present.  Cardiovascular: Normal rate, regular rhythm and normal heart sounds.  Exam reveals no gallop and no friction rub.   No murmur heard. Pulmonary/Chest: Effort normal and breath sounds normal. No accessory muscle usage. No tachypnea. No respiratory distress. He has no decreased breath sounds. He has no wheezes. He has no rhonchi. He has no rales. He exhibits no tenderness.  Musculoskeletal: Normal range of motion. He exhibits no edema.  Lymphadenopathy:    He has no cervical adenopathy.  Neurological: He is alert and oriented to person, place, and time. No cranial nerve deficit. Coordination normal.  Skin: Skin is warm and dry. No rash noted. He is not diaphoretic. No erythema. No pallor.  Psychiatric: He has a normal mood and affect. His behavior is normal. Judgment and thought content normal.          Assessment & Plan:   Problem List Items Addressed This Visit      Unprioritized   Cephalalgia    Left sided headache after syncopal episode on Tuesday. Will set STAT head CT to evaluate for recurrent intracranial hemorrhage.    Syncope - Primary    Recent episodes of syncope. Recent neurology evaluation questioned seizures. Keppra started. Given new episode of syncope and focal headache with history of intracerebral hemorrhage, will get STAT head CT to evaluate bleed.  Relevant Orders      CT Head Wo Contrast       Return in about 1 week (around 08/15/2014) for Recheck.

## 2014-08-08 NOTE — Assessment & Plan Note (Signed)
Left sided headache after syncopal episode on Tuesday. Will set STAT head CT to evaluate for recurrent intracranial hemorrhage.

## 2014-08-08 NOTE — Assessment & Plan Note (Signed)
His blood pressure is reasonably controlled. Given his mild bradycardia, I decreased the dose of Toprol to 50 mg once daily.

## 2014-08-08 NOTE — Telephone Encounter (Signed)
Left message, notifying patient of results

## 2014-08-08 NOTE — Assessment & Plan Note (Signed)
Recent episodes of syncope. Recent neurology evaluation questioned seizures. Keppra started. Given new episode of syncope and focal headache with history of intracerebral hemorrhage, will get STAT head CT to evaluate bleed.

## 2014-08-08 NOTE — Progress Notes (Signed)
Pre visit review using our clinic review tool, if applicable. No additional management support is needed unless otherwise documented below in the visit note. 

## 2014-08-08 NOTE — Progress Notes (Signed)
Primary care physician: Dr. Anderson Malta walker  HPI  This is a pleasant 63 year old male who is here today for evaluation of recurrent syncope versus seizure disorder. He use to follow-up with me in the past in 8850 for diastolic heart failure. At that time, he was morbidly obese but underwent successful gastric bypass. He has multiple chronic medical conditions that include hypertension, type 2 diabetes that resolved with weight loss and hyperlipidemia. He is not a smoker and has no family history of coronary artery disease or sudden death. He has experienced multiple episodes of loss of consciousness over the last few months. He had one prolonged episode in October which resulted in subdural hematoma. He reports that he was unconscious for about one half hours. Seizure activities and stiffness were noted by the person who came to his house. He was hospitalized at [facility]. Echocardiogram showed normal LV systolic function with severe left ventricular hypertrophy.  The patient had milder recurrent episodes since then. The episodes are usually sudden and not preceded by other symptoms. He denies chest pain. No palpitations. He does complain of lightheadedness.  He was seen by neurology and was started on Keppra for suspected seizures although EEG was negative. He is currently not driving.    Allergies  Allergen Reactions  . Altace [Ramipril] Anaphylaxis  . Levaquin [Levofloxacin In D5w] Hives  . Iohexol Hives     Desc: HIVES      Current Outpatient Prescriptions on File Prior to Visit  Medication Sig Dispense Refill  . acetaminophen (TYLENOL) 500 MG tablet Take 1,000 mg by mouth every 6 (six) hours as needed.    Marland Kitchen amLODipine (NORVASC) 5 MG tablet Take 1 tablet (5 mg total) by mouth daily. 90 tablet 3  . CRESTOR 10 MG tablet take 1 tablet by mouth once daily 30 tablet 5  . DULoxetine (CYMBALTA) 60 MG capsule take 1 capsule by mouth once daily 30 capsule 3  . hydrALAZINE (APRESOLINE)  50 MG tablet Take 50 mg by mouth 2 (two) times daily.    Marland Kitchen levETIRAcetam (KEPPRA) 500 MG tablet Take 1 tablet (500 mg total) by mouth 2 (two) times daily. 60 tablet 5  . losartan (COZAAR) 50 MG tablet Take 1 tablet (50 mg total) by mouth daily. 30 tablet 0  . metoprolol (TOPROL-XL) 100 MG 24 hr tablet Take 1 tablet (100 mg total) by mouth daily. 30 tablet 5  . NEXIUM 40 MG capsule take 1 capsule by mouth once daily 30 capsule 10  . sucralfate (CARAFATE) 1 G tablet Take 1 tablet (1 g total) by mouth 4 (four) times daily. 120 tablet 11  . tamsulosin (FLOMAX) 0.4 MG CAPS capsule Take 0.4 mg by mouth daily.    . traMADol (ULTRAM) 50 MG tablet Take 2 tablets (100 mg total) by mouth every 8 (eight) hours as needed for severe pain. 180 tablet 2  . VITAMIN D, CHOLECALCIFEROL, PO Take 1,000 mg by mouth.      No current facility-administered medications on file prior to visit.     Past Medical History  Diagnosis Date  . Hypertension   . Anxiety   . Sleep apnea     hx not now since wt loss  . Diabetes mellitus   . Stones in the urinary tract   . GERD (gastroesophageal reflux disease)   . H/O hiatal hernia   . Headache(784.0)   . Arthritis   . 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  Gastric ulcer   . Degenerative disc disease   . Sacral fracture, closed   . Syncope and collapse   . Chronic kidney disease   . Heart murmur   . Hyperlipidemia   . Hx of congestive heart failure      Past Surgical History  Procedure Laterality Date  . Hernia repair  2000    hiatal  . Gastric bypass  2010    [place]  . Knee arthroscopy      bilateral, left x 2  . Osteotomy  2001    left  . Tonsillectomy    . Appendectomy    . Nasal sinus surgery      x5  . Uvulopalatoplasty  2011  . Anterior cervical decomp/discectomy fusion  01/26/2012    Procedure: ANTERIOR CERVICAL DECOMPRESSION/DISCECTOMY FUSION 3 LEVELS;  Surgeon: Floyce Stakes, MD;  Location: [facility];  Service:  Neurosurgery;  Laterality: N/A;  Cervical three-four Cervical four-five Cervical five-six Cervical six-seven , Anterior cervical decompression/diskectomy, fusion, plate  . Total knee arthroplasty Left 05/2014    Dr. Marry Guan  . Cardiac catheterization      [facility], normal  . Cardiac catheterization      [place]     Family History  Problem Relation Age of Onset  . Dementia Mother 72  . Heart disease Father 52  . Diabetes Father   . Cancer Sister     lymphoma, stage 4  . Cancer Sister 21    Ovarian  . Lupus Sister      History   Social History  . Marital Status: Married    Spouse Name: N/A    Number of Children: N/A  . Years of Education: N/A   Occupational History  . Not on file.   Social History Main Topics  . Smoking status: Never Smoker   . Smokeless tobacco: Never Used  . Alcohol Use: 0.6 oz/week    1 Glasses of wine per week     Comment: occasional  . Drug Use: No  . Sexual Activity:    Partners: Female   Other Topics Concern  . Not on file   Social History Narrative   Lives in [place] with wife, [place]. 2 sons, William Hamburger, Lennette Bihari 36.. 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 It is negative other than that mentioned in the history of present illness.   PHYSICAL EXAM   BP 131/92 mmHg  Pulse 58  Ht 5' 7.5" (1.715 m)  Wt 191 lb 8 oz (86.864 kg)  BMI 29.53 kg/m2 Constitutional: He is oriented to person, place, and time. He appears well-developed and well-nourished. No distress.  HENT: No nasal discharge.  Head: Normocephalic and atraumatic.  Eyes: Pupils are equal and round.  No discharge. Neck: Normal range of motion. Neck supple. No JVD present. No thyromegaly present.    Cardiovascular: Normal rate, regular rhythm, normal heart sounds. Exam reveals no gallop and no friction rub. No murmur heard.  Pulmonary/Chest: Effort normal and breath sounds normal. No stridor. No respiratory distress. He has no wheezes. He has no rales. He exhibits no tenderness.  Abdominal: Soft. Bowel sounds are normal. He exhibits no distension.  There is no tenderness. There is no rebound and no guarding.  Musculoskeletal: Normal range of motion. He exhibits no edema and no tenderness.  Neurological: He is alert and oriented to person, place, and time. Coordination normal.  Skin: Skin is warm and dry. No rash noted. He is not diaphoretic. No erythema. No pallor.  Psychiatric: He has a normal mood and affect. His behavior is normal. Judgment and thought content normal.       HAL:PFXTK  Bradycardia  WITHIN NORMAL LIMITS   ASSESSMENT AND PLAN

## 2014-08-08 NOTE — Patient Instructions (Signed)
Please decrease your Toprol to 50 mg once daily.  Your physician has requested that you have a carotid duplex. This test is an ultrasound of the carotid arteries in your neck. It looks at blood flow through these arteries that supply the brain with blood. Allow one hour for this exam. There are no restrictions or special instructions.  Your physician has recommended that you wear an event monitor. Event monitors are medical devices that record the heart's electrical activity. Doctors most often [place] these monitors to diagnose arrhythmias. Arrhythmias are problems with the speed or rhythm of the heartbeat. The monitor is a small, portable device. You can wear one while you do your normal daily activities. This is usually used to diagnose what is causing palpitations/syncope (passing out).  Your physician recommends that you schedule a follow-up appointment in: 2 months

## 2014-08-08 NOTE — Patient Instructions (Signed)
We will get a CT of your head today to evaluate for any bleeding.  We will call you with results.  Follow up next week

## 2014-08-08 NOTE — Telephone Encounter (Signed)
CT head was normal with no acute hemorrhage. I would recommend continued follow up with neurology, as planned.

## 2014-08-08 NOTE — Assessment & Plan Note (Signed)
He has some features suggestive of atypical seizures and other suggestive of syncope. The one prolonged episode of more than 2 hours is not consistent with a cardiac etiology. Echocardiogram showed normal LV systolic function. I still think we have to exclude arrhythmia as the culprit. Thus, I requested a 30 day outpatient telemetry. I also requested carotid Doppler. He is currently on Keppra for possible seizures and he is going to have a 24 hour EEG according to him.

## 2014-08-09 ENCOUNTER — Ambulatory Visit: Payer: Self-pay | Admitting: Internal Medicine

## 2014-08-20 ENCOUNTER — Encounter (INDEPENDENT_AMBULATORY_CARE_PROVIDER_SITE_OTHER): Payer: Self-pay

## 2014-08-20 ENCOUNTER — Encounter (INDEPENDENT_AMBULATORY_CARE_PROVIDER_SITE_OTHER): Payer: BC Managed Care – PPO

## 2014-08-20 DIAGNOSIS — R55 Syncope and collapse: Secondary | ICD-10-CM

## 2014-08-21 LAB — FERRITIN: Ferritin (ARMC): 148 ng/mL (ref 8–388)

## 2014-08-21 LAB — CANCER CENTER HEMOGLOBIN: HGB: 13.5 g/dL (ref 13.0–18.0)

## 2014-09-03 ENCOUNTER — Encounter: Payer: Self-pay | Admitting: Internal Medicine

## 2014-09-06 ENCOUNTER — Other Ambulatory Visit: Payer: Self-pay | Admitting: Internal Medicine

## 2014-09-06 NOTE — Telephone Encounter (Signed)
Last OV 12.31.15, last refill 1.5.16.  Please advise refill

## 2014-09-06 NOTE — Telephone Encounter (Signed)
rx faxed

## 2014-09-09 ENCOUNTER — Ambulatory Visit: Payer: Self-pay | Admitting: Internal Medicine

## 2014-09-23 ENCOUNTER — Other Ambulatory Visit: Payer: BC Managed Care – PPO

## 2014-09-23 ENCOUNTER — Telehealth: Payer: Self-pay | Admitting: Neurology

## 2014-09-23 NOTE — Telephone Encounter (Signed)
Pt resch appt to 09-26-14

## 2014-09-23 NOTE — Telephone Encounter (Signed)
Please call pt to r/s this morning's 24hr EEG. Per Georgeanna Lea, we can add this pt on for Thursday and she will come in Friday to take off his electrodes / Sherri S.

## 2014-09-26 ENCOUNTER — Ambulatory Visit (INDEPENDENT_AMBULATORY_CARE_PROVIDER_SITE_OTHER): Payer: BC Managed Care – PPO | Admitting: Neurology

## 2014-09-26 DIAGNOSIS — R404 Transient alteration of awareness: Secondary | ICD-10-CM

## 2014-09-26 DIAGNOSIS — S065XAA Traumatic subdural hemorrhage with loss of consciousness status unknown, initial encounter: Secondary | ICD-10-CM

## 2014-09-26 DIAGNOSIS — S065X9A Traumatic subdural hemorrhage with loss of consciousness of unspecified duration, initial encounter: Secondary | ICD-10-CM

## 2014-09-30 NOTE — Procedures (Signed)
24 HOUR AMBULATOR ELECTROENCEPHALOGRAM REPORT  Date of Study: 09/25/2014 to 09/26/2014  Patient's Name: Justin Patel MRN: [id number] Date of Birth: [birth year]  Indication: 64 year old man with subdural hematoma and transient altered awareness  Medications include:  Tramadol, Cymbalta  Technical Summary: This is a multichannel digital EEG recording, using the international 10-20 placement system.  Spike detection software was employed.  Description: The EEG background is symmetric, with a well-developed posterior dominant rhythm of 8-9 Hz, which is reactive to eye opening and closing.  Diffuse beta activity is seen, with a bilateral frontal preponderance.  No focal or generalized abnormalities are seen.  No focal or generalized epileptiform discharges are seen.  Stage II sleep is seen, with normal and symmetric sleep patterns.  Hyperventilation and photic stimulation were not performed.  ECG revealed normal cardiac rate and rhythm.  Impression: This is a normal 24 hour ambulatory EEG of the awake and asleep states, without activating procedures.  A normal study does not rule out the possibility of a seizure disorder in this patient.  Lenah Messenger R. Tomi Likens, DO

## 2014-10-01 ENCOUNTER — Ambulatory Visit (INDEPENDENT_AMBULATORY_CARE_PROVIDER_SITE_OTHER): Payer: BC Managed Care – PPO | Admitting: Internal Medicine

## 2014-10-01 ENCOUNTER — Encounter: Payer: Self-pay | Admitting: Internal Medicine

## 2014-10-01 VITALS — BP 147/98 | HR 96 | Temp 98.4°F | Ht 67.5 in | Wt 196.2 lb

## 2014-10-01 DIAGNOSIS — R55 Syncope and collapse: Secondary | ICD-10-CM

## 2014-10-01 DIAGNOSIS — F1010 Alcohol abuse, uncomplicated: Secondary | ICD-10-CM

## 2014-10-01 DIAGNOSIS — I10 Essential (primary) hypertension: Secondary | ICD-10-CM

## 2014-10-01 DIAGNOSIS — Z87898 Personal history of other specified conditions: Secondary | ICD-10-CM | POA: Insufficient documentation

## 2014-10-01 NOTE — Assessment & Plan Note (Signed)
Encouraged him to seek counseling for substance abuse through [facility]. If unsuccessful, we discussed option of [facility].

## 2014-10-01 NOTE — Assessment & Plan Note (Signed)
BP Readings from Last 3 Encounters:  10/01/14 147/98  08/08/14 131/92  08/08/14 122/80   BP generally has been well controlled. Continue current medications. Follow up with Dr. Fletcher Anon next week.

## 2014-10-01 NOTE — Patient Instructions (Signed)
Follow up in 3 months

## 2014-10-01 NOTE — Progress Notes (Signed)
Subjective:    Patient ID: Justin Patel, male    DOB: 24-Jan-1951, 64 y.o.   MRN: [id number]  HPI 64YO male presents for follow up.  Last seen 12/31 for syncope. Evaluated by neurology and started on Keppra.  Recently seen by Dr. Tomi Likens. EEG was normal. Continues on Keppra.  Also seen by cardiology, Dr. Fletcher Anon. Scheduled to have 30 day event monitor.  While on Keppra, has had strange dreams, including ones of suicide. However denies depression outside of dreams.  Recent recheck of Hgb was 13.8.  Feeling well. No recurrent episodes of syncope.  Reports long history of alcohol use. Has only discussed this with his wife. Drinking about 2 glasses of wine per night. Plans to seek counseling at [facility] and quit drinking.   Wt Readings from Last 3 Encounters:  10/01/14 196 lb 4 oz (89.018 kg)  08/08/14 191 lb 8 oz (86.864 kg)  08/08/14 192 lb 4 oz (87.204 kg)    Past medical, surgical, family and social history per today's encounter.  Review of Systems  Constitutional: Negative for fever, chills, activity change, appetite change, fatigue and unexpected weight change.  Eyes: Negative for visual disturbance.  Respiratory: Negative for cough and shortness of breath.   Cardiovascular: Negative for chest pain, palpitations and leg swelling.  Gastrointestinal: Negative for abdominal pain and abdominal distention.  Genitourinary: Negative for dysuria, urgency and difficulty urinating.  Musculoskeletal: Negative for arthralgias and gait problem.  Skin: Negative for color change and rash.  Hematological: Negative for adenopathy.  Psychiatric/Behavioral: Negative for suicidal ideas, sleep disturbance and dysphoric mood. The patient is not nervous/anxious.        Objective:    BP 147/98 mmHg  Pulse 96  Temp(Src) 98.4 F (36.9 C) (Oral)  Ht 5' 7.5" (1.715 m)  Wt 196 lb 4 oz (89.018 kg)  BMI 30.27 kg/m2  SpO2 98% Physical Exam  Constitutional: He is oriented to person, place, and  time. He appears well-developed and well-nourished. No distress.  HENT:  Head: Normocephalic and atraumatic.  Right Ear: External ear normal.  Left Ear: External ear normal.  Nose: Nose normal.  Mouth/Throat: Oropharynx is clear and moist. No oropharyngeal exudate.  Eyes: Conjunctivae and EOM are normal. Pupils are equal, round, and reactive to light. Right eye exhibits no discharge. Left eye exhibits no discharge. No scleral icterus.  Neck: Normal range of motion. Neck supple. No tracheal deviation present. No thyromegaly present.  Cardiovascular: Normal rate, regular rhythm and normal heart sounds.  Exam reveals no gallop and no friction rub.   No murmur heard. Pulmonary/Chest: Effort normal and breath sounds normal. No accessory muscle usage. No tachypnea. No respiratory distress. He has no decreased breath sounds. He has no wheezes. He has no rhonchi. He has no rales. He exhibits no tenderness.  Musculoskeletal: Normal range of motion. He exhibits no edema.  Lymphadenopathy:    He has no cervical adenopathy.  Neurological: He is alert and oriented to person, place, and time. No cranial nerve deficit. Coordination normal.  Skin: Skin is warm and dry. No rash noted. He is not diaphoretic. No erythema. No pallor.  Psychiatric: He has a normal mood and affect. His behavior is normal. Judgment and thought content normal.          Assessment & Plan:   Problem List Items Addressed This Visit      Unprioritized   Alcohol abuse    Encouraged him to seek counseling for substance abuse through [facility]. If unsuccessful,  we discussed option of [facility].      Essential hypertension, benign    BP Readings from Last 3 Encounters:  10/01/14 147/98  08/08/14 131/92  08/08/14 122/80   BP generally has been well controlled. Continue current medications. Follow up with Dr. Fletcher Anon next week.      Syncope - Primary    Evaluation normal to date. EEG normal, however continues on  Keppra. Scheduled for 30 day heart monitor. No recurrent episodes. Discussed that alcohol use would increased risk of syncope. He is seeking counseling through [facility].          Return in about 3 months (around 12/30/2014) for Recheck.

## 2014-10-01 NOTE — Assessment & Plan Note (Signed)
Evaluation normal to date. EEG normal, however continues on Keppra. Scheduled for 30 day heart monitor. No recurrent episodes. Discussed that alcohol use would increased risk of syncope. He is seeking counseling through [facility].

## 2014-10-01 NOTE — Progress Notes (Signed)
Pre visit review using our clinic review tool, if applicable. No additional management support is needed unless otherwise documented below in the visit note. 

## 2014-10-02 ENCOUNTER — Other Ambulatory Visit: Payer: Self-pay | Admitting: Internal Medicine

## 2014-10-06 ENCOUNTER — Other Ambulatory Visit: Payer: Self-pay | Admitting: Internal Medicine

## 2014-10-07 ENCOUNTER — Encounter: Payer: Self-pay | Admitting: Cardiovascular Disease

## 2014-10-07 ENCOUNTER — Ambulatory Visit (INDEPENDENT_AMBULATORY_CARE_PROVIDER_SITE_OTHER): Payer: BC Managed Care – PPO | Admitting: Cardiovascular Disease

## 2014-10-07 VITALS — BP 138/80 | HR 79 | Ht 68.0 in | Wt 193.8 lb

## 2014-10-07 DIAGNOSIS — R55 Syncope and collapse: Secondary | ICD-10-CM

## 2014-10-07 DIAGNOSIS — I10 Essential (primary) hypertension: Secondary | ICD-10-CM

## 2014-10-07 NOTE — Assessment & Plan Note (Signed)
Echocardiogram showed normal LV systolic function. He did have mild sinus bradycardia which resolved completely after decreasing the dose of Toprol. Carotid Doppler showed no evidence of obstructive disease.  He is currently on Keppra for possible seizures .  Symptoms of dizziness improved significantly after decreasing the dose of Toprol. He feels back to his baseline. If he develops any recurrent episodes, then an outpatient telemetry will be pursued.

## 2014-10-07 NOTE — Patient Instructions (Signed)
Continue same medications.   Your physician wants you to follow-up in: 6 months.  You will receive a reminder letter in the mail two months in advance. If you don't receive a letter, please call our office to schedule the follow-up appointment.  

## 2014-10-07 NOTE — Progress Notes (Signed)
Primary care physician: Dr. Anderson Malta walker  HPI  This is a pleasant 64 year old male who is here today for a follow-up visit regarding  syncope versus seizure disorder. He used to follow-up with me in the past in 5188 for diastolic heart failure. At that time, he was morbidly obese but underwent successful gastric bypass. He has multiple chronic medical conditions that include hypertension, type 2 diabetes that resolved with weight loss and hyperlipidemia. He is not a smoker and has no family history of coronary artery disease or sudden death. He has experienced multiple episodes of loss of consciousness over the last few months. He had one prolonged episode in October which resulted in subdural hematoma. Seizure activities and stiffness were noted by the person who came to his house. He was hospitalized at [facility]. Echocardiogram showed normal LV systolic function with severe left ventricular hypertrophy.  The patient had milder recurrent episodes since then. The episodes are usually sudden and not preceded by other symptoms. He denies chest pain. No palpitations. He does complain of lightheadedness.  He was seen by neurology and was started on Keppra for suspected seizures although EEG was negative.  During last visit, he was noted to be mildly bradycardic. I decreased the dose of Toprol to 50 mg once daily. Carotid Doppler showed no evidence of obstructive disease. He is feeling significantly better with no further dizziness since decreasing the dose of Toprol. He was supposed to get a 30 day outpatient telemetry. However, he did not get it as he was feeling better.   Allergies  Allergen Reactions  . Altace [Ramipril] Anaphylaxis  . Levaquin [Levofloxacin In D5w] Hives  . Iohexol Hives     Desc: HIVES      Current Outpatient Prescriptions on File Prior to Visit  Medication Sig Dispense Refill  . acetaminophen (TYLENOL) 500 MG tablet Take 1,000 mg by mouth every 6 (six) hours as  needed.    Marland Kitchen amLODipine (NORVASC) 5 MG tablet Take 1 tablet (5 mg total) by mouth daily. 90 tablet 3  . CRESTOR 10 MG tablet take 1 tablet by mouth once daily 30 tablet 5  . DULoxetine (CYMBALTA) 60 MG capsule take 1 capsule by mouth once daily 30 capsule 3  . hydrALAZINE (APRESOLINE) 50 MG tablet Take 50 mg by mouth 4 (four) times daily.     Marland Kitchen HYDROcodone-acetaminophen (NORCO/VICODIN) 5-325 MG per tablet Take 1 tablet by mouth 3 (three) times daily as needed for moderate pain. 60 tablet 0  . levETIRAcetam (KEPPRA) 500 MG tablet Take 1 tablet (500 mg total) by mouth 2 (two) times daily. 60 tablet 5  . losartan (COZAAR) 50 MG tablet Take 1 tablet (50 mg total) by mouth daily. 30 tablet 0  . metoprolol succinate (TOPROL-XL) 50 MG 24 hr tablet Take 1 tablet (50 mg total) by mouth daily. 30 tablet 6  . NEXIUM 40 MG capsule take 1 capsule by mouth once daily 30 capsule 10  . sucralfate (CARAFATE) 1 G tablet Take 1 tablet (1 g total) by mouth 4 (four) times daily. 120 tablet 11  . tamsulosin (FLOMAX) 0.4 MG CAPS capsule Take 0.4 mg by mouth daily.    . traMADol (ULTRAM) 50 MG tablet take 2 tablets by mouth every 8 hours if needed for SEVERE PAIN 180 tablet 2  . VITAMIN D, CHOLECALCIFEROL, PO Take 1,000 mg by mouth.      No current facility-administered medications on file prior to visit.     Past Medical History  Diagnosis Date  . Hypertension   . Anxiety   . Sleep apnea     hx not now since wt loss  . Diabetes mellitus   . Stones in the urinary tract   . GERD (gastroesophageal reflux disease)   . H/O hiatal hernia   . Headache(784.0)   . Arthritis   . 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 Gastric ulcer   . Degenerative disc disease   . Sacral fracture, closed   . Syncope and collapse   . Chronic kidney disease   . Heart murmur   . Hyperlipidemia   . Hx of congestive heart failure      Past Surgical History  Procedure Laterality Date  . Hernia repair  2000     hiatal  . Gastric bypass  2010    [place]  . Knee arthroscopy      bilateral, left x 2  . Osteotomy  2001    left  . Tonsillectomy    . Appendectomy    . Nasal sinus surgery      x5  . Uvulopalatoplasty  2011  . Anterior cervical decomp/discectomy fusion  01/26/2012    Procedure: ANTERIOR CERVICAL DECOMPRESSION/DISCECTOMY FUSION 3 LEVELS;  Surgeon: Floyce Stakes, MD;  Location: [facility];  Service: Neurosurgery;  Laterality: N/A;  Cervical three-four Cervical four-five Cervical five-six Cervical six-seven , Anterior cervical decompression/diskectomy, fusion, plate  . Total knee arthroplasty Left 05/2014    Dr. Marry Guan  . Cardiac catheterization      [facility], normal  . Cardiac catheterization      [employer]     Family History  Problem Relation Age of Onset  . Dementia Mother 36  . Heart disease Father 52  . Diabetes Father   . Cancer Sister     lymphoma, stage 4  . Cancer Sister 30    Ovarian  . Lupus Sister      History   Social History  . Marital Status: Married    Spouse Name: N/A  . Number of Children: N/A  . Years of Education: N/A   Occupational History  . Not on file.   Social History Main Topics  . Smoking status: Never Smoker   . Smokeless tobacco: Never Used  . Alcohol Use: 0.6 oz/week    1 Glasses of wine per week     Comment: occasional  . Drug Use: No  . Sexual Activity:    Partners: Female   Other Topics Concern  . Not on file   Social History Narrative   Lives in [place] with wife, [place]. 2 sons, William Hamburger, Lennette Bihari 10.. 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 It is negative other than that mentioned in the history of present illness.   PHYSICAL  EXAM   BP 138/80 mmHg  Pulse 79  Ht 5\' 8"  (1.727 m)  Wt 193 lb 12 oz (87.884 kg)  BMI 29.47 kg/m2   Constitutional: He is oriented to person, place, and time. He appears well-developed and well-nourished. No distress.  HENT: No nasal discharge.  Head: Normocephalic and atraumatic.  Eyes: Pupils are equal and  round.  No discharge. Neck: Normal range of motion. Neck supple. No JVD present. No thyromegaly present.  Cardiovascular: Normal rate, regular rhythm, normal heart sounds. Exam reveals no gallop and no friction rub. No murmur heard.  Pulmonary/Chest: Effort normal and breath sounds normal. No stridor. No respiratory distress. He has no wheezes. He has no rales. He exhibits no tenderness.  Abdominal: Soft. Bowel sounds are normal. He exhibits no distension. There is no tenderness. There is no rebound and no guarding.  Musculoskeletal: Normal range of motion. He exhibits no edema and no tenderness.  Neurological: He is alert and oriented to person, place, and time. Coordination normal.  Skin: Skin is warm and dry. No rash noted. He is not diaphoretic. No erythema. No pallor.  Psychiatric: He has a normal mood and affect. His behavior is normal. Judgment and thought content normal.         ASSESSMENT AND PLAN

## 2014-10-07 NOTE — Assessment & Plan Note (Signed)
Blood pressure is well controlled on current medications. 

## 2014-10-09 ENCOUNTER — Telehealth: Payer: Self-pay | Admitting: *Deleted

## 2014-10-09 NOTE — Telephone Encounter (Signed)
-----   Message from Dudley Major, DO sent at 09/30/2014  8:36 AM EST ----- EEG is normal.

## 2014-10-09 NOTE — Telephone Encounter (Signed)
Patient is aware of normal eeg 

## 2014-10-25 ENCOUNTER — Ambulatory Visit
Admit: 2014-10-25 | Disposition: A | Discharge status: Home / Self Care | Payer: Self-pay | Attending: Internal Medicine | Admitting: Internal Medicine

## 2014-10-30 ENCOUNTER — Other Ambulatory Visit: Payer: Self-pay | Admitting: Internal Medicine

## 2014-10-30 ENCOUNTER — Telehealth: Payer: Self-pay | Admitting: *Deleted

## 2014-10-30 DIAGNOSIS — J320 Chronic maxillary sinusitis: Secondary | ICD-10-CM

## 2014-10-30 NOTE — Telephone Encounter (Signed)
Pt called stating that he has an "upper respiratory infection with sore throat, nasal drainage, etc"  Pt states that you and him discussed an ENT referral if he decided that he wanted one.  Pt is now requesting the referral to be put in for Dr Tami Ribas

## 2014-10-30 NOTE — Telephone Encounter (Signed)
OK. I will place a referral

## 2014-11-05 ENCOUNTER — Other Ambulatory Visit: Payer: Self-pay | Admitting: Internal Medicine

## 2014-11-08 ENCOUNTER — Ambulatory Visit
Admit: 2014-11-08 | Disposition: A | Discharge status: Home / Self Care | Payer: Self-pay | Attending: Internal Medicine | Admitting: Internal Medicine

## 2014-11-11 ENCOUNTER — Ambulatory Visit (INDEPENDENT_AMBULATORY_CARE_PROVIDER_SITE_OTHER): Payer: BC Managed Care – PPO | Admitting: Internal Medicine

## 2014-11-11 ENCOUNTER — Encounter: Payer: Self-pay | Admitting: Internal Medicine

## 2014-11-11 ENCOUNTER — Telehealth: Payer: Self-pay | Admitting: Internal Medicine

## 2014-11-11 VITALS — BP 86/58 | HR 59 | Temp 97.4°F | Resp 16 | Ht 68.0 in | Wt 195.0 lb

## 2014-11-11 DIAGNOSIS — M545 Low back pain, unspecified: Secondary | ICD-10-CM

## 2014-11-11 DIAGNOSIS — M7989 Other specified soft tissue disorders: Secondary | ICD-10-CM

## 2014-11-11 DIAGNOSIS — S065XAA Traumatic subdural hemorrhage with loss of consciousness status unknown, initial encounter: Secondary | ICD-10-CM

## 2014-11-11 DIAGNOSIS — R600 Localized edema: Secondary | ICD-10-CM | POA: Diagnosis not present

## 2014-11-11 DIAGNOSIS — I952 Hypotension due to drugs: Secondary | ICD-10-CM | POA: Diagnosis not present

## 2014-11-11 DIAGNOSIS — I6200 Nontraumatic subdural hemorrhage, unspecified: Secondary | ICD-10-CM

## 2014-11-11 DIAGNOSIS — H66002 Acute suppurative otitis media without spontaneous rupture of ear drum, left ear: Secondary | ICD-10-CM

## 2014-11-11 DIAGNOSIS — G8929 Other chronic pain: Secondary | ICD-10-CM

## 2014-11-11 DIAGNOSIS — S065X9A Traumatic subdural hemorrhage with loss of consciousness of unspecified duration, initial encounter: Secondary | ICD-10-CM

## 2014-11-11 MED ORDER — AMOXICILLIN-POT CLAVULANATE 875-125 MG PO TABS: 1.0000 | ORAL_TABLET | Freq: Two times a day (BID) | ORAL | Status: DC

## 2014-11-11 NOTE — Progress Notes (Signed)
Patient ID: Justin Patel, male   DOB: [birth year], 64 y.o.   MRN: [id number]  Patient Active Problem List   Diagnosis Date Noted  . Otitis media 11/12/2014  . Leg edema, right 11/12/2014  . Hypotension 11/12/2014  . Alcohol abuse 10/01/2014  . Cephalalgia 08/08/2014  . Intracerebral hemorrhage 06/04/2014  . SDH (subdural hematoma) 06/03/2014  . Syncope 06/03/2014  . Fall 06/03/2014  . Chest wall pain 02/18/2014  . Left knee pain 02/04/2014  . Enlarged prostate 11/08/2013  . Anemia 11/08/2013  . Hypovitaminosis D 10/11/2013  . Fracture of metatarsal bone of right foot 10/11/2013  . Urinary hesitancy 08/27/2013  . Renal cyst 08/27/2013  . Upper GI bleed 08/20/2013  . Type II or unspecified type diabetes mellitus without mention of complication, not stated as uncontrolled 03/21/2013  . Chronic low back pain 03/21/2013  . Essential hypertension, benign 03/21/2013  . Other and unspecified hyperlipidemia 03/21/2013  . Obesity, unspecified 03/21/2013    Subjective:  CC:   Chief Complaint  Patient presents with  . Acute Visit    Patient has leg swelling right leg has started new medication for pain and thinks he has taken to much 100mg  evry six hours.  . Leg Swelling    bilateral swelling noted non-pitting.    HPI:   Justin Patel is a 64 y.o. male who presents for evaluation of Multiple issues,  40 minute visit due to multiple issues raised once here.   Patient placed on late night schedule by Triage RN for evaluation of  asymmetric swelling of right leg instead OF BEING DIRECTED TO ER FOR EVAL AND DVT RULE OUT.  Fortunately the swelling is occurring without pain of calf or discoloration.   RIGHT LEG SWELLING was first  NOTICED Sunday MORNING. He has not travelled [place] distance in over a week,  Went to [place] over Easter and returned last week,  Walking daily.   Denies calf pain.  No RECENT SURGERY.  History of remote fractures of right foot from fall,  No other history of  trauma or surgery to right leg.  Taks amlodipine and hydralazine for BP control.  Not on a diuretic,  No history of CJF.    2) hypotension.  patient denies dizziness, last fall was  In November, with blunt head trauma resulting in  SDH and seizures per review of prior visits, all attributed to hypotension.   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  Has been taking keppra since his [place] in november ,  But has had no seizure activity,  Per patinet had a 24 hour ECG .  Sees  Dr. Tomi Likens in April   4) Has been having increased drainage from left myringotomy tube, accompanied by left ear pain and bilateral maxillary sinus pressure  For the past week, along with decreased hearing out of left ear.   The  pain in the left ear  is improving  Since the tube started draining but still present .  The tube was placed by chap Tami Ribas 3 years ago and he sees him next Monday . Green sinus drainage , scant.    5) Medication overuse:  Has been taking 2 tramadol every 6 hours,  8 daily.  Wants to know if it is addictive.  Rx is for 6 daily.     Past Medical History  Diagnosis Date  . Hypertension   . Anxiety   . Sleep apnea     hx not now since wt loss  .  Diabetes mellitus   . Stones in the urinary tract   . GERD (gastroesophageal reflux disease)   . H/O hiatal hernia   . Headache(784.0)   . Arthritis   . 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 Gastric ulcer   . Degenerative disc disease   . Sacral fracture, closed   . Syncope and collapse   . Chronic kidney disease   . Heart murmur   . Hyperlipidemia   . Hx of congestive heart failure     Past Surgical History  Procedure Laterality Date  . Hernia repair  2000    hiatal  . Gastric bypass  2010    [place]  . Knee arthroscopy      bilateral, left x 2  . Osteotomy  2001    left  . Tonsillectomy    . Appendectomy    . Nasal sinus surgery      x5  . Uvulopalatoplasty  2011  . Anterior cervical decomp/discectomy fusion   01/26/2012    Procedure: ANTERIOR CERVICAL DECOMPRESSION/DISCECTOMY FUSION 3 LEVELS;  Surgeon: Floyce Stakes, MD;  Location: [facility];  Service: Neurosurgery;  Laterality: N/A;  Cervical three-four Cervical four-five Cervical five-six Cervical six-seven , Anterior cervical decompression/diskectomy, fusion, plate  . Total knee arthroplasty Left 05/2014    Dr. Marry Guan  . Cardiac catheterization      [facility], normal  . Cardiac catheterization      [place]       The following portions of the patient's history were reviewed and updated as appropriate: Allergies, current medications, and problem list.    Review of Systems:   Patient denies headache, fevers, malaise, unintentional weight loss, skin rash, eye pain, sinus congestion and sinus pain, sore throat, dysphagia,  hemoptysis , cough, dyspnea, wheezing, chest pain, palpitations, orthopnea, edema, abdominal pain, nausea, melena, diarrhea, constipation, flank pain, dysuria, hematuria, urinary  Frequency, nocturia, numbness, tingling, seizures,  Focal weakness, Loss of consciousness,  Tremor, insomnia, depression, anxiety, and suicidal ideation.     History   Social History  . Marital Status: Married    Spouse Name: N/A  . Number of Children: N/A  . Years of Education: N/A   Occupational History  . Not on file.   Social History Main Topics  . Smoking status: Never Smoker   . Smokeless tobacco: Never Used  . Alcohol Use: 0.6 oz/week    1 Glasses of wine per week     Comment: occasional  . Drug Use: No  . Sexual Activity:    Partners: Female   Other Topics Concern  . Not on file   Social History Narrative   Lives in [place] with wife, Sombrillo. 2 sons, William Hamburger, Lennette Bihari 33.. 4 grandchildren      Work - Retired, previously taught music in [place] - regular diet, limited quantities after gastric bypass      Exercise - no regular, limited by arthritis in knees, occasional  water aerobics    Objective:  Filed Vitals:   11/11/14 1902  BP: 86/58  Pulse: 59  Temp: 97.4 F (36.3 C)  Resp: 16     General appearance: alert, cooperative and appears stated age HEEN: left TM enflamed,  With effusion ,  Tube in place.  right TM is normal. Bilateral maxillary tenderness to palpation.  Throat: lips, mucosa, and tongue normal; teeth and gums normal Neck: no adenopathy, no  carotid bruit, supple, symmetrical, trachea midline and thyroid not enlarged, symmetric, no tenderness/mass/nodules Back: symmetric, no curvature. ROM normal. No CVA tenderness. Lungs: clear to auscultation bilaterally Heart: regular rate and rhythm, S1, S2 normal, no murmur, click, rub or gallop Abdomen: soft, non-tender; bowel sounds normal; no masses,  no organomegaly Pulses: 2+ and symmetric Ext/Skin: Skin color, texture, turgor normal.  Left lower leg with pitting edema, no calf tenderness.  Negative Homan's sign. No rashes or lesions Lymph nodes: Cervical, supraclavicular, and axillary nodes normal.  Assessment and Plan:  Otitis media Left TM is very red.  Given history will treat with augmentin.  Has follow up wth ENT one week.  Probiotic advised,    Leg edema, right Asymmetry of edema warrants rule out DVT. Bevelyn Buckles' sign is negative and calf is not enflamed, so I did not send him to ED , but ordered  Ultrasound for tomorrow.  Given presence of two medications  that can cause peripheral edema and concurrent hypotension with history of recurrent catastrophic falls, I am suspending amlodipine and hydralazine.    Hypotension Stopping amlodipine and hydralazine.  Continue metoprolol,  Advised to take losartan only for SBP>150 tomorrow   SDH (subdural hematoma) Patient has been seizure free and anticipates that his Keppra will be stopped by Dr. Tomi Likens next month    Chronic low back pain Advised to reduce use of tramadol to 6 daily as directed by PCP. Patient also taking cymbalta  But  no vicodin      Updated Medication List Outpatient Encounter Prescriptions as of 11/11/2014  Medication Sig  . acetaminophen (TYLENOL) 500 MG tablet Take 1,000 mg by mouth every 6 (six) hours as needed.  . CRESTOR 10 MG tablet take 1 tablet by mouth once daily  . DULoxetine (CYMBALTA) 60 MG capsule take 1 capsule by mouth once daily  . levETIRAcetam (KEPPRA) 500 MG tablet Take 1 tablet (500 mg total) by mouth 2 (two) times daily.  Marland Kitchen losartan (COZAAR) 100 MG tablet take 1 tablet by mouth once daily  . metoprolol succinate (TOPROL-XL) 50 MG 24 hr tablet Take 1 tablet (50 mg total) by mouth daily.  Marland Kitchen NEXIUM 40 MG capsule take 1 capsule by mouth once daily  . sucralfate (CARAFATE) 1 G tablet Take 1 tablet (1 g total) by mouth 4 (four) times daily.  . tamsulosin (FLOMAX) 0.4 MG CAPS capsule Take 0.4 mg by mouth daily.  . traMADol (ULTRAM) 50 MG tablet take 2 tablets by mouth every 8 hours if needed for SEVERE PAIN  . VITAMIN D, CHOLECALCIFEROL, PO Take 1,000 mg by mouth.   . [DISCONTINUED] amLODipine (NORVASC) 5 MG tablet Take 1 tablet (5 mg total) by mouth daily.  . [DISCONTINUED] hydrALAZINE (APRESOLINE) 50 MG tablet Take 50 mg by mouth 4 (four) times daily.   Marland Kitchen amoxicillin-clavulanate (AUGMENTIN) 875-125 MG per tablet Take 1 tablet by mouth 2 (two) times daily.  . [DISCONTINUED] HYDROcodone-acetaminophen (NORCO/VICODIN) 5-325 MG per tablet Take 1 tablet by mouth 3 (three) times daily as needed for moderate pain. (Patient not taking: Reported on 11/11/2014)     Orders Placed This Encounter  Procedures  . US Venous Img Lower Unilateral Right    No Follow-up on file.   A total of 40 minutes was spent with patient more than half of which was spent in counseling patient on the above mentioned issues , and coordination of care.

## 2014-11-11 NOTE — Patient Instructions (Addendum)
You are on several blood pressures that are contributing to your leg swelling and are causing low blood pressure STOP THE HYDRALAZINE AND THE AMLODIPINE .   Do continue the metoprolol  And take it  When you wake up.   DO NOT TAKE THE LOSARTAN    UNLESS YOUR  MORNING BP IS > 597 SYSTOLIC (top number)  at 10 am   We will get an ultrasound of the right leg tomorrow.  I am also treating your for augmentin for otitis meida of the left ear  ,  Take with food  Please take a probiotic ( Align, Floraque or Culturelle) while you are on the antibiotic to prevent a serious antibiotic associated diarrhea  Called clostirudium dificile colitis

## 2014-11-11 NOTE — Telephone Encounter (Signed)
[facility]  Patient Name: KASHON KRAYNAK  DOB: 12-Jun-1951    Initial Comment Caller states right leg is swollen, ankle twice the size. Left leg is a little. No pain.   Nurse Assessment  Nurse: Kenton Kingfisher, RN, Meagan Date/Time (Eastern Time): 11/11/2014 11:33:03 AM  Confirm and document reason for call. If symptomatic, describe symptoms. ---Caller states right leg is swollen, ankle twice the size. Left leg is a little. No pain. Leg swelling started yesterday. Caller states they are taking Tramadol pain medication and started it yesterday as well.  Has the patient traveled out of the country within the last 30 days? ---Not Applicable  Does the patient require triage? ---Yes  Related visit to physician within the last 2 weeks? ---No  Does the PT have any chronic conditions? (i.e. diabetes, asthma, etc.) ---Yes  List chronic conditions. ---gastric bypass surgery iron deficiency anemia hypertension on 4 medications history of diabetes but did gastric bypass and is no longer having to treat it     Guidelines    Guideline Title Affirmed Question Affirmed Notes  Leg Swelling and Edema [1] Thigh, calf, or ankle swelling AND [2] bilateral AND [3] 1 side is more swollen right side is swollen worse than the left side   Final Disposition User   See Physician within 4 Hours (or PCP triage) Kenton Kingfisher, RN, Meagan

## 2014-11-11 NOTE — Progress Notes (Signed)
Pre-visit discussion using our clinic review tool. No additional management support is needed unless otherwise documented below in the visit note.  

## 2014-11-12 ENCOUNTER — Telehealth: Payer: Self-pay | Admitting: *Deleted

## 2014-11-12 ENCOUNTER — Ambulatory Visit
Admit: 2014-11-12 | Disposition: A | Discharge status: Home / Self Care | Payer: Self-pay | Attending: Internal Medicine | Admitting: Internal Medicine

## 2014-11-12 DIAGNOSIS — R600 Localized edema: Secondary | ICD-10-CM | POA: Insufficient documentation

## 2014-11-12 DIAGNOSIS — H6690 Otitis media, unspecified, unspecified ear: Secondary | ICD-10-CM | POA: Insufficient documentation

## 2014-11-12 DIAGNOSIS — I959 Hypotension, unspecified: Secondary | ICD-10-CM | POA: Insufficient documentation

## 2014-11-12 NOTE — Assessment & Plan Note (Signed)
Asymmetry of edema warrants rule out DVT. Justin Patel' sign is negative and calf is not enflamed, so I did not send him to ED , but ordered  Ultrasound for tomorrow.  Given presence of two medications  that can cause peripheral edema and concurrent hypotension with history of recurrent catastrophic falls, I am suspending amlodipine and hydralazine.

## 2014-11-12 NOTE — Assessment & Plan Note (Signed)
Patient has been seizure free and anticipates that his Keppra will be stopped by Dr. Tomi Likens next month

## 2014-11-12 NOTE — Assessment & Plan Note (Signed)
Advised to reduce use of tramadol to 6 daily as directed by PCP. Patient also taking cymbalta  But no vicodin

## 2014-11-12 NOTE — Telephone Encounter (Signed)
Spoke to pt, states 88/50 was his BP at home this morning. Has just rechecked it at home again, 98/63. States only has lightheadedness when he stands from a sitting position, is standing slowly to avoid symptom. He did not take Losartan, Amlodipine or hydralazine today. Advised his BP was improved and I would let Dr. Gilford Rile know and call back with any further recommendations.

## 2014-11-12 NOTE — Assessment & Plan Note (Signed)
Left TM is very red.  Given history will treat with augmentin.  Has follow up wth ENT one week.  Probiotic advised,

## 2014-11-12 NOTE — Telephone Encounter (Signed)
OK. He should continue off those medications. Will need a follow up this week. If any worsening symptoms, then needs to be evaluated immediately.

## 2014-11-12 NOTE — Assessment & Plan Note (Signed)
Stopping amlodipine and hydralazine.  Continue metoprolol,  Advised to take losartan only for SBP>150 tomorrow

## 2014-11-12 NOTE — Telephone Encounter (Signed)
I reviewed Dr. Lupita Dawn note. He should have stopped Amlodipine and should be holding Losartan today, as SBP<150. If he has held these medications and continues to be hypotensive, then needs to be seen.

## 2014-11-12 NOTE — Telephone Encounter (Signed)
[facility] ultrasound called, ultrsound negative for DVT. Pt saw Dr. Derrel Nip yesterday, had some BP meds suspended due to hypotension. At ultrasound, pt BP 88/60, felt dizzy and lightheaded, when standing from sitting. Pt states felt ok to leave. Wanted Dr. Gilford Rile to be aware. Advised we would call him with any changes

## 2014-11-12 NOTE — Telephone Encounter (Signed)
PT notified and  verbalized understanding. Appt scheduled 11/14/14

## 2014-11-14 ENCOUNTER — Ambulatory Visit: Payer: BC Managed Care – PPO | Admitting: Internal Medicine

## 2014-11-14 ENCOUNTER — Telehealth: Payer: Self-pay | Admitting: Internal Medicine

## 2014-11-14 NOTE — Telephone Encounter (Signed)
[place] Patient Name: Justin Patel DOB: 14-Feb-1951 Initial Comment Caller states he is on antibiotic for infection in left ear. He has a tube in that ear and liquid is running out of it Nurse Assessment Nurse: Markus Daft, RN, Sherre Poot Date/Time (Eastern Time): 11/14/2014 9:43:10 AM Confirm and document reason for call. If symptomatic, describe symptoms. ---Caller states he started Augmentin for left ear infection 2 wks, still has 3 days left. He has a tube in that same ear and infection is so bad that liquid is running out of it and runs down his face. He is musician and concerned about hearing. Limited hearing, congested in his ear currently. Has the patient traveled out of the country within the last 30 days? ---Not Applicable Does the patient require triage? ---Yes Related visit to physician within the last 2 weeks? ---No Does the PT have any chronic conditions? (i.e. diabetes, asthma, etc.) ---Yes List chronic conditions. ---HTN, 5 surgeries on his sinuses. left ear tube. Gastric bypass surgery - lost 170 lb, and so no further DM. Guidelines Guideline Title Affirmed Question Affirmed Notes Ear - Otitis Media Follow-up Call [1] Taking antibiotic > 72 hours (3 days) and [2] pain persists or recurs Final Disposition User See Physician within [place], [place], [place] Comments No available appts today or tomorrow. Has ENT MD appt on Monday. The drainage is green in color. Temp is 97.2 by mouth just now. **Please call pt back with an option to work in at office today if possible.

## 2014-11-14 NOTE — Telephone Encounter (Signed)
Spoke with pt, he forgot he had appoint on 4.8.16 w/Dr Gilford Rile.  Pt aware and verbalized understanding.

## 2014-11-14 NOTE — Telephone Encounter (Signed)
FYI, pt already has BP f/u appoint scheduled for tomorrow.  Can pt be advised ear drainage will be addressed as well.  Please advise

## 2014-11-14 NOTE — Telephone Encounter (Signed)
I think we can address this at visit tomorrow

## 2014-11-15 ENCOUNTER — Ambulatory Visit (INDEPENDENT_AMBULATORY_CARE_PROVIDER_SITE_OTHER): Payer: BC Managed Care – PPO | Admitting: Internal Medicine

## 2014-11-15 ENCOUNTER — Encounter: Payer: Self-pay | Admitting: Internal Medicine

## 2014-11-15 VITALS — BP 136/87 | HR 62 | Temp 97.8°F | Resp 14 | Ht 68.0 in | Wt 195.5 lb

## 2014-11-15 DIAGNOSIS — R600 Localized edema: Secondary | ICD-10-CM

## 2014-11-15 DIAGNOSIS — H66002 Acute suppurative otitis media without spontaneous rupture of ear drum, left ear: Secondary | ICD-10-CM

## 2014-11-15 DIAGNOSIS — I952 Hypotension due to drugs: Secondary | ICD-10-CM

## 2014-11-15 NOTE — Assessment & Plan Note (Signed)
Persistent right leg edema. [place] for DVT was negative. Will set up vascular evaluation for venous dopplers for reflux with Dr. Lucky Cowboy.

## 2014-11-15 NOTE — Progress Notes (Signed)
Pre visit review using our clinic review tool, if applicable. No additional management support is needed unless otherwise documented below in the visit note. 

## 2014-11-15 NOTE — Patient Instructions (Signed)
We will set up evaluation with ENT and vascular.  Follow up in 4 weeks.

## 2014-11-15 NOTE — Assessment & Plan Note (Signed)
BP Readings from Last 3 Encounters:  11/15/14 136/87  11/11/14 86/58  10/07/14 138/80   BP improved today. Will continue Losartan and Metoprolol.

## 2014-11-15 NOTE — Assessment & Plan Note (Signed)
Persistent infection with TM tube that appears to be dislodged. Will set up ENT evaluation today with Dr. Richardson Landry at 3:45pm. Continue Augmentin.

## 2014-11-15 NOTE — Progress Notes (Signed)
Subjective:    Patient ID: Justin Patel, male    DOB: November 27, 1950, 64 y.o.   MRN: [id number]  HPI  64YO male presents for follow up.  Last seen 4/4 by Dr. Derrel Nip for leg swelling. Also noted to have left OM.  [place] for DVT was negative. Treated with  Augmentin for OM.  Right leg swelling - persistent, however improved somewhat over the last week. No pain in leg.  Left ear pain - Persistent, having drainage of purulent fluid from ear. ENT evaluation planned for Monday, Dr. Tami Ribas. Notes hearing loss from ear and dizziness. Has tubes in place bilaterally.  Started counseling sessions with Kaylyn Lim. Feels this is helpful.    Past medical, surgical, family and social history per today's encounter.  Review of Systems  Constitutional: Positive for fatigue. Negative for fever, chills, activity change, appetite change and unexpected weight change.  HENT: Positive for ear discharge, ear pain and hearing loss. Negative for congestion, postnasal drip, rhinorrhea, sinus pressure, sneezing, sore throat, tinnitus, trouble swallowing and voice change.   Eyes: Negative for visual disturbance.  Respiratory: Negative for cough and shortness of breath.   Cardiovascular: Positive for leg swelling. Negative for chest pain and palpitations.  Gastrointestinal: Negative for abdominal pain and abdominal distention.  Genitourinary: Negative for dysuria, urgency and difficulty urinating.  Musculoskeletal: Negative for arthralgias and gait problem.  Skin: Negative for color change and rash.  Neurological: Positive for dizziness and light-headedness. Negative for syncope, facial asymmetry, weakness and headaches.  Hematological: Negative for adenopathy.  Psychiatric/Behavioral: Positive for dysphoric mood. Negative for suicidal ideas and sleep disturbance. The patient is nervous/anxious.        Objective:    BP 136/87 mmHg  Pulse 62  Temp(Src) 97.8 F (36.6 C) (Oral)  Resp 14  Ht 5\' 8"  (1.727  m)  Wt 195 lb 8 oz (88.678 kg)  BMI 29.73 kg/m2  SpO2 97% Physical Exam  Constitutional: He is oriented to person, place, and time. He appears well-developed and well-nourished. No distress.  HENT:  Head: Normocephalic and atraumatic.  Right Ear: External ear normal. A middle ear effusion is present.  Left Ear: There is drainage. Tympanic membrane is erythematous and bulging. A middle ear effusion is present.  Nose: Nose normal.  Mouth/Throat: Oropharynx is clear and moist. No oropharyngeal exudate.  Right ear with purulent drainage, TM tube visible but unable to tell if it is in place.  Eyes: Conjunctivae and EOM are normal. Pupils are equal, round, and reactive to light. Right eye exhibits no discharge. Left eye exhibits no discharge. No scleral icterus.  Neck: Normal range of motion. Neck supple. No tracheal deviation present. No thyromegaly present.  Cardiovascular: Normal rate, regular rhythm and normal heart sounds.  Exam reveals no gallop and no friction rub.   No murmur heard. Pulmonary/Chest: Effort normal and breath sounds normal. No accessory muscle usage. No tachypnea. No respiratory distress. He has no decreased breath sounds. He has no wheezes. He has no rhonchi. He has no rales. He exhibits no tenderness.  Musculoskeletal: Normal range of motion. He exhibits no edema.  Lymphadenopathy:    He has no cervical adenopathy.  Neurological: He is alert and oriented to person, place, and time. No cranial nerve deficit. Coordination normal.  Skin: Skin is warm and dry. No rash noted. He is not diaphoretic. No erythema. No pallor.  Psychiatric: His behavior is normal. Judgment and thought content normal. His mood appears anxious. He exhibits a depressed mood. He  expresses no suicidal ideation.          Assessment & Plan:   Problem List Items Addressed This Visit      Unprioritized   Hypotension    BP Readings from Last 3 Encounters:  11/15/14 136/87  11/11/14 86/58    10/07/14 138/80   BP improved today. Will continue Losartan and Metoprolol.      Leg edema, right    Persistent right leg edema. [place] for DVT was negative. Will set up vascular evaluation for venous dopplers for reflux with Dr. Lucky Cowboy.      Relevant Orders   Ambulatory referral to Vascular Surgery   Otitis media - Primary    Persistent infection with TM tube that appears to be dislodged. Will set up ENT evaluation today with Dr. Richardson Landry at 3:45pm. Continue Augmentin.          Return in about 4 weeks (around 12/13/2014) for Recheck.

## 2014-11-26 NOTE — Discharge Summary (Signed)
PATIENT NAME:  Justin Patel, Justin Patel MR#:  [id number] DATE OF BIRTH:  [birth year]  DATE OF ADMISSION:  03/05/2012 DATE OF DISCHARGE:  03/07/2012  DISCHARGE DIAGNOSES:  1. Gastric anastomotic ulcer.  2. Gastrointestinal hemorrhage.  3. Chronic anemia.  4. Type II diabetes mellitus.   OPERATIONS/PROCEDURES: Upper GI endoscopy and cauterization.   CONSULTATIONS: Gastroenterology, Dr. Arnetha Courser MEDICATIONS: The patient is to resume home medications. Additional medications are:  1. Protonix 40 mg twice a day.  2. Vicodin 5/325 p.r.n. q.6 hours.   HOSPITAL COURSE: This gentleman was admitted through the Emergency Room after complaining of blood in his stool. He has a history of gastric bypass and previous GI bleed. He was admitted to a monitored bed. A Gastroenterology consultation was placed with Dr. Candace Cruise. His initial hemoglobin was 12 and remained at that level over the next two days. He underwent upper GI endoscopy yesterday which revealed a bleeding ulcer at his gastric anastomotic site which was successfully cauterized by Dr. Candace Cruise. The patient denied any further bright red blood per rectum or melena. His hemoglobin today was 10.7 with normal heart rate and blood pressure. On the advice of Gastroenterology, the patient was discharged to home in a stable and satisfactory condition. He has been advised to come to our office for repeat complete blood count on 03/09/2012.   CONDITION ON DISCHARGE: The patient was discharged in satisfactory condition.   DIET: Diabetic diet, low sodium.   ACTIVITY: As tolerated.   FOLLOW-UP: Follow-up in two days for CBC check and then in 1 to 2 weeks with Dr. Elijio Miles and Dr. Dionne Milo.   TIME SPENT ON DISCHARGE: 36 minutes.   ____________________________ Venetia Maxon Elijio Miles, MD sat:drc D: 03/07/2012 13:24:42 ET T: 03/08/2012 11:05:54 ET JOB#: [id number]  cc: Sheikh A. Elijio Miles, MD, <Dictator> Veverly Fells MD ELECTRONICALLY SIGNED 03/29/2012 12:47

## 2014-11-26 NOTE — Consult Note (Signed)
More melena. EGD showed anastomotic ulcer. THis ulcer cauterized. Reg diet. Continue PPI BID plus carafate qid.If no further bleeding, can be discharged tomorrow with f/u in GI later. Thanks.  Electronic Signatures: Verdie Shire (MD)  (Signed on 29-Jul-13 10:07)  Authored  Last Updated: 29-Jul-13 10:07 by Verdie Shire (MD)

## 2014-11-26 NOTE — Consult Note (Signed)
PATIENT NAME:  Justin Patel, ROUCH MR#:  [id number] DATE OF BIRTH:  07/18/1951  DATE OF CONSULTATION:  03/05/2012  REFERRING PHYSICIAN:   CONSULTING PHYSICIAN:  Lupita Dawn. Synda Bagent, MD  REASON FOR CONSULTATION: Melena with drop in hemoglobin.   HISTORY OF PRESENT ILLNESS: The patient is a 64 year old white male who had gastric bypass surgery approximately three years ago at [facility]. He has lost approximately 130 pounds since. He presents with recurrent gastrointestinal bleeding. In April of this year, he had endoscopy that by Dr. Gustavo Lah which showed evidence of gastric bypass surgery and an ulcer at the gastrojejunal anastomosis. The patient was placed on Carafate four times a day and Protonix twice a day afterwards. Unfortunately, the patient ran out of Protonix about a month ago because his insurance will not cover for it. The patient now presents with at least 6 to 7 bouts of melena since yesterday associated with some abdominal pain, but more in the left lower quadrant area. Also felt somewhat lightheaded when standing and walking for the past week or two. When he presented his blood pressure was high with a systolic blood pressure in the 180s. Because of the high blood pressure and the GI bleed, the patient came to the emergency room for evaluation and then was admitted afterwards.   REVIEW OF SYSTEMS: There is no fever, chills, or fatigue. There are no changes in vision or hearing recently. There is no nausea, vomiting, or dysphagia, but does have abdominal pain with melena. There is no chest pain or palpitations. There is no coughing or shortness of breath. The  rest of the review of symptoms is noncontributory.   PAST MEDICAL HISTORY:  1. Gastric bypass. He had a colonoscopy by Dr. Dionne Milo about a year ago which was supposedly negative.  2. Diabetes. 3. Hypertension. 4. History of depression.  5. Chronic back pain.  6. Hyperlipidemia.  7. History of kidney stones. 8. History of  diverticulosis.    PAST SURGICAL HISTORY:  1. Recent neck surgery for vertebral cervical fusion.  2. Left leg surgery. 3. Gastric bypass.   FAMILY HISTORY: Notable for heart disease and dementia.   SOCIAL HISTORY: Negative for tobacco and alcohol use. He teaches music at a school.  ADMISSION MEDICATIONS: Metoprolol, losartan, Levemir, Ultram, Carafate, hydralazine, Lasix, Cymbalta, and Crestor.  DRUG ALLERGIES: Iodine and Altace.  PHYSICAL EXAMINATION:   GENERAL: He is in no acute distress.   VITAL SIGNS: His initial blood pressure is 185/107 and temperature is 98.5.   HEENT: Normocephalic, atraumatic head. Pupils are equally reactive. Throat was clear.   NECK: Supple.   CARDIAC: Regular rhythm and rate without murmurs.   LUNGS: Clear bilaterally.   ABDOMEN: Normoactive bowel sounds, soft. I did not really appreciate tenderness today. There is no hepatomegaly. There are no palpable masses. He had active bowel sounds.   EXTREMITIES: No clubbing, cyanosis, or edema.   SKIN: Negative.   NEUROLOGIC: Nonfocal.   LABORATORY DATA: Sodium 142, potassium 3.5, chloride 107, CO2 27, glucose 150, BUN 15, and creatinine 1.13. Liver enzymes are normal. Troponin level is normal. His initial hemoglobin was 13.5 and white count 10.0. INR is normal.  ASSESSMENT AND PLAN: This is a patient with known history of gastric bypass with a history of anastomotic ulcer in April. This sounds like he has a recurrent ulcer. We need to monitor his hemoglobin. We will plan on doing an upper endoscopy tomorrow to find the source of the bleeding. If there is active bleeding,  we will cauterize. The patient will go back on PPI twice a day and Carafate. If he keeps on having recurrent ulcerations despite medications then he might need revision of his gastric bypass later.  ____________________________ Lupita Dawn. Candace Cruise, MD pyo:slb D: 03/06/2012 10:13:52 ET T: 03/06/2012 12:09:35 ET JOB#: [id number]  cc: Lupita Dawn. Candace Cruise,  MD, <Dictator> Lupita Dawn Velda Wendt MD ELECTRONICALLY SIGNED 03/06/2012 13:53

## 2014-11-26 NOTE — Consult Note (Signed)
Full consult to follow. S/P gastric bypass 3 yrs ago. Has lost 130lb since. Had gastic ulcers in 4/13. Placed on carafate qid and protonix bid afterwards. Ran out of protonix 1 month ago. Insurance would not approve. Now with melena 6-7x yest. Some abd pain. Hx of tics. Last colon 2012? Liquid diet today. EGD sometime tomorrow. Thanks.  Electronic Signatures: Verdie Shire (MD)  (Signed on 28-Jul-13 10:58)  Authored  Last Updated: 28-Jul-13 10:58 by Verdie Shire (MD)

## 2014-11-26 NOTE — H&P (Signed)
PATIENT NAME:  Justin Patel, Justin Patel MR#:  [id number] DATE OF BIRTH:  01-30-1951  DATE OF ADMISSION:  03/05/2012  PRIMARY CARE PHYSICIAN: Lamonte Sakai, MD   CHIEF COMPLAINT: Black tarry stool.  HISTORY OF PRESENT ILLNESS: Justin Patel is a 64 year old Caucasian male with history of gastric bypass surgery and recurrent presentation with GI bleed presenting with melena. Last workup for him was in April of this year when he had upper endoscopy by Dr. Gustavo Lah. Endoscopy at that time showed evidence of gastric bypass and gastric pouch where a small size was found and the gastrojejunal anastomosis was characterized by ulceration. This showed a vessel under eschar, no evidence of recent bleeding. Two smaller ulcerations were noted in the upper pouch but no active bleeding. The patient is now presenting again to the Emergency Department with black tarry stool, at least five episodes. He reports that he had some diarrhea yesterday and said his stool since that time got darker and darker until it became melanotic or tarry black stool. The patient reports some vague abdominal pain in the left lower quadrant and left flank area anteriorly for the last 4 to 5 days. The pain is described as sharp pain if you push the area in, otherwise, it is a mild pain or negligible. The severity of the pain varies. It is up and down. He also describes lightheadedness especially when he stands up or walks but this is for the last two weeks and not recent. The patient was found to have severe hypertension. His blood pressure is uncontrolled reaching 989 systolic. The patient was admitted to the hospital for further evaluation of his GI bleed and also blood pressure control and to follow-up on his hemoglobin.   REVIEW OF SYSTEMS: CONSTITUTIONAL: Denies any fever. No chills. No fatigue. EYES: No blurring of vision. No double vision. However, the left eye appears slightly inflamed and has some mild tearing or excessive tearing. ENT: No hearing  impairment. No sore throat. No dysphagia. CARDIOVASCULAR: No chest pain. No shortness of breath. No edema. No syncope. RESPIRATORY: No chest pain. No shortness of breath. No cough or sputum production. GASTROINTESTINAL: No vomiting but reported diarrhea yesterday and black tarry stool for the last 24 hours. He has mild left-sided abdominal pain. GENITOURINARY: No dysuria. No frequency of urination. MUSCULOSKELETAL: No joint pain or swelling. No muscular pain or swelling. INTEGUMENTARY: No skin rash. No ulcers. NEUROLOGIC: No focal weakness. No seizure activity. No headache. PSYCHIATRIC: No anxiety. He has no symptoms of depression. ENDOCRINE: No polyuria or polydipsia. No heat or cold intolerance.   PAST MEDICAL HISTORY: 1. History of recurrent GI bleed. 2. History of gastric bypass surgery, using Roux-en-y gastric bypass. Last colonoscopy done by Dr. Dionne Milo about a year ago. Last upper endoscopy was done by Dr. Gustavo Lah in April of this year with findings as above. 3. Systemic hypertension. 4. Diabetes mellitus, type II. 5. Past history of depression. 6. Gastroesophageal reflux disease. 7. Severe diverticulosis.  8. Chronic back pain. 9. Hyperlipidemia. 10. Diabetic neuropathy. 11. Degenerative osteoarthritis. 12. History of kidney stones. 13. History of hemorrhoids.   PAST SURGICAL HISTORY: 1. Recent neck surgery for cervical vertebral fusion. 2. History of gastric bypass surgery as above. 3. Left leg osteotomy.  FAMILY HISTORY: His father died at the age of 53 after having a heart attack. His mother died at the age of 26 from complications of Alzheimer's dementia.   SOCIAL HABITS: Nonsmoker. No history of alcohol or drug abuse.  SOCIAL HISTORY: The patient is  married living with his wife. He is a Engineer, structural.  ADMISSION MEDICATIONS: 1. Ultram 50 mg one tablet q.4 to 6 hours p.r.n. 2. Metoprolol 100 mg two tablets once a day. 3. Losartan 100 mg once a  day. 4. Levemir 26 units once a day at night. 5. Valium q.8 hours p.r.n. 6. Sucralfate 1 gram four times a day. 7. Hydralazine 50 mg twice a day. 8. Furosemide 20 mg once a day. 9. Cymbalta 60 mg once a day. 10. Crestor 10 mg once a day.  ALLERGIES: Iodine and iodinated contrast causing hives. Altace causes swelling. Some kind of side effect from Levaquin.  PHYSICAL EXAMINATION:  VITAL SIGNS: Blood pressure 185/107, respiratory rate 20, pulse 69, temperature 98.5, oxygen saturation 97%.  GENERAL APPEARANCE: Elderly male in no acute distress.  HEAD: No pallor. No icterus. No cyanosis.  EARS, NOSE, AND THROAT: Hearing was normal. Nasal mucosa, lips, and tongue were normal.   EYES: Normal iris and conjunctivae except for mild redness in the left conjunctivae. Pupils about 4 mm, equal and reactive to light.  NECK: Supple. Trachea at midline. No thyromegaly. No cervical lymphadenopathy. There is scar tissue on the left side of the neck from recent surgery. This looks clean.   HEART: Normal S1, S2. No S3, S4. No murmur. No gallop. No carotid bruits.   RESPIRATORY: Normal breathing pattern without use of accessory muscles. No rales. No wheezing.   ABDOMEN: Soft without tenderness. No hepatosplenomegaly. No masses. No hernias.   SKIN: No ulcers. No subcutaneous nodules.  MUSCULOSKELETAL: No joint swelling. No clubbing.   NEUROLOGIC: Cranial nerves II through XII were intact. No focal motor deficits.   PSYCHIATRIC: The patient is alert and oriented x3. Mood and affect were normal.   LABORATORY FINDINGS: EKG showed normal sinus rhythm at rate of 70 per minute. Nonspecific T wave minor abnormalities, otherwise unremarkable EKG.   Serum glucose 150, BUN 15, creatinine 1.1, sodium 142, potassium 3.5. Liver function tests were normal. Troponin less than 0.02. CBC showed white count of 10,000, hemoglobin 13.5, hematocrit 39, platelet count 169. Prothrombin time 13.7 with INR of  1.  ASSESSMENT: 1. Melena likely secondary again to upper GI bleed. His stool is heme positive.  2. Nonspecific left lower quadrant abdominal pain mild to moderate in intensity for the last 4 to 5 days. 3. Systemic hypertension, uncontrolled. 4. History of severe diverticulosis. 5. History of gastric bypass surgery. 6. Diabetes mellitus, type II, on insulin. 7. Diabetic neuropathy.  PLAN: 1. Will admit the patient to the medical floor on telemetry monitoring. 2. Check hemoglobin and hematocrit q.6 hours x3.  3. IV Protonix drip. 4. GI consultation. 5. Avoid non-steroidal anti-inflammatory medications. 6. Use Ultram for pain. 7. Hold Levemir since the patient will be n.p.o. except for his medications. 8. Will place him on Accu-Cheks and sliding scale. 9. Will continue his home medications including blood pressure medications and monitor blood pressure. 10. The patient may be given amlodipine 5 mg twice a day p.r.n. if systolic blood pressure is above 170.  TIME SPENT IN EVALUATING THIS PATIENT: More than 55 minutes.   ____________________________ Clovis Pu. Lenore Manner, MD amd:drc D: 03/05/2012 01:48:54 ET T: 03/05/2012 09:13:19 ET JOB#: [id number] cc: Clovis Pu. Lenore Manner, MD, <Dictator>, Perrin Maltese, MD Mike Craze Irven Coe MD ELECTRONICALLY SIGNED 03/16/2012 22:18

## 2014-11-29 NOTE — Consult Note (Signed)
Chief Complaint:  Subjective/Chief Complaint Left flank pain better. For renal u/s this afternoon. No further bleeding. Tolerating solids. Hgb stable.   VITAL SIGNS/ANCILLARY NOTES: **Vital Signs.:   15-Dec-14 06:19  Vital Signs Type Routine  Temperature Temperature (F) 98.4  Celsius 36.8  Temperature Source oral  Pulse Pulse 69  Respirations Respirations 18  Systolic BP Systolic BP 278  Diastolic BP (mmHg) Diastolic BP (mmHg) 86  Mean BP 110  Pulse Ox % Pulse Ox % 97  Pulse Ox Activity Level  At rest  Oxygen Delivery Room Air/ 21 %   Brief Assessment:  GEN no acute distress   Cardiac Regular   Respiratory clear BS   Gastrointestinal Normal   Lab Results: Routine Chem:  15-Dec-14 04:09   Glucose, Serum  105  BUN 10  Creatinine (comp) 1.00  Sodium, Serum 141  Potassium, Serum 4.4  Chloride, Serum  110  CO2, Serum 27  Calcium (Total), Serum 8.7  Anion Gap  4  Osmolality (calc) 281  eGFR (African American) >60  eGFR (Non-African American) >60 (eGFR values <47m/min/1.73 m2 may be an indication of chronic kidney disease (CKD). Calculated eGFR is useful in patients with stable renal function. The eGFR calculation will not be reliable in acutely ill patients when serum creatinine is changing rapidly. It is not useful in  patients on dialysis. The eGFR calculation may not be applicable to patients at the low and high extremes of body sizes, pregnant women, and vegetarians.)  Routine Hem:  15-Dec-14 04:09   WBC (CBC) 9.1  RBC (CBC)  3.24  Hemoglobin (CBC)  10.4  Hematocrit (CBC)  31.1  Platelet Count (CBC)  93  MCV 96  MCH 32.1  MCHC 33.5  RDW 14.2  Neutrophil % 70.7  Lymphocyte % 17.4  Monocyte % 7.6  Eosinophil % 3.8  Basophil % 0.5  Neutrophil # 6.4  Lymphocyte # 1.6  Monocyte # 0.7  Eosinophil # 0.3  Basophil # 0.0 (Result(s) reported on 23 Jul 2013 at 05:14AM.)   Assessment/Plan:  Assessment/Plan:  Assessment Anastomotic ulcer. On ppi/carafte.    Plan Since his insurance covers protonix, make sure patient is discharged on protonix and carafate[place]. Await renal u/s. Will sign off. [place] [facility] [place]. Thanks.   Electronic Signatures: OVerdie Shire(MD)  (Signed 15-Dec-14 12:55)  Authored: Chief Complaint, VITAL SIGNS/ANCILLARY NOTES, Brief Assessment, Lab Results, Assessment/Plan   Last Updated: 15-Dec-14 12:55 by OVerdie Shire(MD)

## 2014-11-29 NOTE — Consult Note (Signed)
Referring Physician:  Vaughan Basta :   Reason for Consult: Admit Date: 21-Jul-2013  Chief Complaint: "I felt severely lightheaded and passed out."  Reason for Consult: syncope   History of Present Illness: History of Present Illness:   Mr. Mcdanel is a 64 yo man with a history of obesity, gastric bypass surgery, insulin-dependent DM, dyslipidemia, and HTN who presented to [facility] after having two syncopal episodes at home. The Neurology service is consulted to evaluate for seizure activity as part of his presentation.  was in the bathroom this morning, feeling generally weak since waking up this morning, and gradually began to feel extremely lightheaded with tunnel vision. He lost consciousness and fell over into the bathtub. His wife arrived and found him to be ashen pale with generalized tremulousness. He was unconscious for several seconds, and during that time evacuated his bowels, which contained bright red blood as well as melanotic stool. He woke and attempted to stand. The patient remembers leaning against the wall trying to stay upright as his severe lightheadedness overcame him and he buckled and lost consciousness again for several seconds. When he came to, his wife described that he was trembling all over and seemed disoriented. EMS was called and per the wife found his initial BP to be profoundly low at 50/38. They began giving fluids and transported him to [facility] for further evaluation. Here, he was found to have a drop in his hemoglobin from a baseline of 13 to 9.6, and was admitted for management of GI bleed. note, the patient complains of a tremor of the right hand/arm that has impaired his ability to hold utensils, type, write, and play music. This has gotten progressively worse over the last year and is partially relieved by a glass of wine. His mother had a similar tremor.  ROS:  Review of Systems   As per HPI. In addition, the patient denies fevers, chills, visual changes,  chest discomfort, palpitations, cough, shortness of breath, nausea, vomiting, joint tenderness, or rashes. The remainder of a full 12-point review of systems is negative.   Past Medical/Surgical Hx:  anemia:   mastoiditis:   Back Pain, Chronic:   Hypercholesterolemia:   GERD - Esophageal Reflux:   Diverticulosis:   HTN:   Diabetes:   Cervical Fusion: with Dr. Joya Salm  gastric by-pass surgery 02-05-09:   Rhinoplasty:   Osteotomy, Left knee:   Past Medical/ Surgical Hx:  Past Medical History Obesity (lost 162 pounds last several years after gastric bypass), dyslipidemia, HTN, insulin-dependent DM   Past Surgical History Gastric bypass June 2010   Home Medications: Medication Instructions Last Modified Date/Time  Ultram 50 mg oral tablet 3 tab(s) orally once a day, As Needed- for Pain  13-Dec-14 14:02  Cymbalta 60 mg oral delayed release capsule 1 cap(s) orally once a day 13-Dec-14 14:02  furosemide 20 mg oral tablet 1 tab(s) orally once a day 13-Dec-14 14:02  Levemir 26 unit(s) subcutaneous once a day (at bedtime) 13-Dec-14 14:02  metoprolol succinate 200 mg oral tablet, extended release 1 tab(s) orally once a day 13-Dec-14 14:02  sucralfate 1 g oral tablet 1 tab(s) orally 2 times a day (before meals) 13-Dec-14 14:02  Crestor 10 mg oral tablet 1 tab(s) orally once a day (at bedtime) 13-Dec-14 14:02  losartan 100 mg oral tablet 1 tab(s) orally once a day 13-Dec-14 14:02  hydrALAZINE 100 mg oral tablet 1 tab(s) orally 3 times a day 13-Dec-14 14:02   Allergies:  Iodine Strong: Hives  Altace: Swelling  Contrast - Iodinated Radiocontrast Dye: Hives  Levaquin: Unknown  Social/Family History: Employment Status: retired; 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  Lives With: spouse  Social History: Never smoked. Drinks occasional glass of wine. Denies illicit drug use.   Vital Signs: **Vital Signs.:   13-Dec-14 16:12  Vital Signs Type Recheck   Temperature Temperature (F) 99  Celsius 37.2  Temperature Source oral  Pulse Pulse 82  Respirations Respirations 18  Systolic BP Systolic BP 009  Diastolic BP (mmHg) Diastolic BP (mmHg) 84  Mean BP 108  Pulse Ox % Pulse Ox % 99  Pulse Ox Activity Level  At rest  Oxygen Delivery 2L; Nasal Cannula   EXAM: Well-developed, well-nourished, in NAD. No conjunctival injection or scleral edema. Oropharynx clear. No carotid bruits auscultated. Normal S1, S2 and regular cardiac rhythm on exam. Lungs clear to auscultation bilaterally. Abdomen soft and nontender. Peripheral pulses palpated. No clubbing, cyanosis, or edema in extremities.  MENTAL STATUS: Alert and oriented to person, place, and time. Language fluent and appropriate. Cognition and memory conversationally intact. CRANIAL NERVES: Visual fields full to confrontation. PERRL. EOMI. Facial sensation intact. Facial muscles full and symmetric. Hearing intact to finger rub. Uvula midline with symmetric palatal elevation. Tongue midline without fasciculations. MOTOR: Normal bulk and tone. Notable postural tremor is present in the right hand/arm. Strength 5/5 in deltoids, biceps, triceps, wrist flexors and extensors, and hand intrinsics bilaterally. Strength 5/5 in iliopsoas, glutes, hamstrings, quads, and tib ant bilaterally. REFLEXES: 2+ in biceps, triceps, patella, and achilles bilaterally. Flexor plantar responses bilaterally. SENSORY: Intact to vibration and pinprick throughout. COORDINATION: No ataxia or dysmetria on finger-nose or heel-shin maneuvers. GAIT: Not assessed.  Lab Results: Hepatic:  13-Dec-14 07:33   Bilirubin, Total 0.3  Alkaline Phosphatase 50 (45-117 NOTE: New Reference Range 06/29/13)  SGPT (ALT) 16  SGOT (AST) 15  Total Protein, Serum  5.0  Albumin, Serum  2.5  Routine BB:  13-Dec-14 07:33   ABO Group + Rh Type AB Positive  Antibody Screen NEGATIVE (Result(s) reported on 21 Jul 2013 at 08:26AM.)  Crossmatch Unit  1 Ready  Crossmatch Unit 2 Ready  Crossmatch Unit 3 Ready  Crossmatch Unit 4 Issued (Result(s) reported on 21 Jul 2013 at 09:05AM.)  Routine Chem:  13-Dec-14 07:33   Glucose, Serum  305  BUN  37  Creatinine (comp)  1.59  Sodium, Serum 142  Potassium, Serum 4.0  Chloride, Serum  108  CO2, Serum 26  Calcium (Total), Serum  7.9  Osmolality (calc) 303  eGFR (African American)  53  eGFR (Non-African American)  46 (eGFR values <63m/min/1.73 m2 may be an indication of chronic kidney disease (CKD). Calculated eGFR is useful in patients with stable renal function. The eGFR calculation will not be reliable in acutely ill patients when serum creatinine is changing rapidly. It is not useful in  patients on dialysis. The eGFR calculation may not be applicable to patients at the low and high extremes of body sizes, pregnant women, and vegetarians.)  Anion Gap 8  Cardiac:  13-Dec-14 07:33   CK, Total 50 (Result(s) reported on 21 Jul 2013 at 09:22AM.)  Routine Hem:  13-Dec-14 07:33   WBC (CBC) 9.8  RBC (CBC)  3.10  Hemoglobin (CBC)  9.6  Hematocrit (CBC)  30.0  Platelet Count (CBC) 175 (Result(s) reported on 21 Jul 2013 at 07:49AM.)  MCV 97  MCH 30.9  MCHC 32.0  RDW 14.4   Radiology Results: CT:  13-Dec-14 10:09, CT Head Without Contrast  CT Head Without Contrast   REASON FOR EXAM:    syncopal, seizures  COMMENTS:       PROCEDURE: CT  - CT HEAD WITHOUT CONTRAST  - Jul 21 2013 10:09AM     CLINICAL DATA:  Syncopal episode    EXAM:  CT HEAD WITHOUT CONTRAST    TECHNIQUE:  Contiguous axial images were obtained fromthe base of the skull  through the vertex without intravenous contrast.    COMPARISON:  None.  FINDINGS:  There is no evidence of intra-axial nor extra-axial fluid  collections no evidence of acute hemorrhage. The ventricles and  cisterns patent. Thepons, cerebellum and basal ganglia regions are  unremarkable. There is no evidence of mass effect. There is  no  evidence of a depressed skull fracture. Areas of mucosal thickening  with opacification identified within the ethmoid air cells.  Postsurgical changes identified within the left maxillary sinus,  mucosal thickening is identified within a left sphenoid air cell.  Incidental note is made of dystrophic calcifications within the  pontine implant. Mastoid air cells demonstrate areas of  consolidation within the right maxillary sinus.     IMPRESSION:  No evidence of acute intracranial abnormalities. There are findings  which may represent sinus disease involving the ethmoid air cells,  left maxillary sinus and sphenoid air cells.      Electronically Signed    By: Margaree Mackintosh M.D.    On: 07/21/2013 10:18         Verified By: Mikki Santee, M.D., MD   Radiology Impression: Radiology Impression: CT HEAD WITHOUT CONTRAST    No evidence of acute intracranial abnormalities. There are findings  which may represent sinus disease involving the ethmoid air cells,  left maxillary sinus and sphenoid air cells.   Impression/Recommendations: Recommendations:   IMPRESSION: Mr. Kuss is a 64 yo man with a history of gastric bypass and prior GI bleeding who presented today with two syncopal spells associated with confusion and tremors, in the setting of hypotension and anemia associated with GI bleeding. His neurologic exam shows what appears to be essential tremor of the right hand but otherwise is normal. My impression is that the history is consistent with convulsive syncope due to hypotension and acute blood loss anemia, with associated confusion due to temporary cerebral hypoperfusion of the same etiology. His head imaging and exam are not indicative of a central process. Continue excellent management of his GI bleed and fluid resuscitationNo indication for antiepileptic therapy at this timeDefer EEG unless similar episodes recur, which is unlikely now that he has been hemodynamically  stabilizedPatient has been increasingly bothered by tremor of the right hand, which is consistent with essential tremor (postural, +family history, improved with EtOH). Recommend outpatient followup of this with PCP; he is amenable to pharmacologic treatment such as propranolol or primidone but would not start that now you for the opportunity to participate in Mr. Cragg care. Please page Neurology consults with further questions.  Electronic Signatures: Carmin Richmond (MD)  (Signed 13-Dec-14 19:04)  Authored: REFERRING PHYSICIAN, Consult, History of Present Illness, Review of Systems, PAST MEDICAL/SURGICAL HISTORY, HOME MEDICATIONS, ALLERGIES, Social/Family History, NURSING VITAL SIGNS, Physical Exam-, LAB RESULTS, RADIOLOGY RESULTS, Recommendations   Last Updated: 13-Dec-14 19:04 by Carmin Richmond (MD)

## 2014-11-29 NOTE — Consult Note (Signed)
Chief Complaint:  Subjective/Chief Complaint Fatigued. Received 2 more units of blood overnight. Now c/o left flank pain. Does have hx of left kidney stones in the past. Some old blood per rectum last night.   VITAL SIGNS/ANCILLARY NOTES: **Vital Signs.:   14-Dec-14 09:27  Vital Signs Type 1 hr Post Blood  Temperature Temperature (F) 98.8  Celsius 37.1  Temperature Source oral  Pulse Pulse 58  Respirations Respirations 17  Systolic BP Systolic BP 536  Diastolic BP (mmHg) Diastolic BP (mmHg) 80  Mean BP 112  Pulse Ox % Pulse Ox % 100  Pulse Ox Activity Level  At rest  Oxygen Delivery Room Air/ 21 %   Brief Assessment:  GEN no acute distress   Cardiac Regular   Respiratory clear BS   Gastrointestinal Normal   Lab Results: Hepatic:  14-Dec-14 04:10   Bilirubin, Total 0.4  Alkaline Phosphatase  44 (45-117 NOTE: New Reference Range 06/29/13)  SGOT (AST) 16  Total Protein, Serum  4.5  Albumin, Serum  2.3  Routine Chem:  14-Dec-14 04:10   Glucose, Serum 95  BUN 17  Creatinine (comp) 1.02  Sodium, Serum 144  Potassium, Serum  3.3  Chloride, Serum  115  CO2, Serum 22  Calcium (Total), Serum  7.6  Osmolality (calc) 288  eGFR (African American) >60  eGFR (Non-African American) >60 (eGFR values <63m/min/1.73 m2 may be an indication of chronic kidney disease (CKD). Calculated eGFR is useful in patients with stable renal function. The eGFR calculation will not be reliable in acutely ill patients when serum creatinine is changing rapidly. It is not useful in  patients on dialysis. The eGFR calculation may not be applicable to patients at the low and high extremes of body sizes, pregnant women, and vegetarians.)  Anion Gap 7  Hemoglobin A1c ([facility]) 5.5 (The American Diabetes Association recommends that a primary goal of therapy should be <7% and that physicians should reevaluate the treatment regimen in patients with HbA1c values consistently >8%.)  Routine Hem:   14-Dec-14 04:10   WBC (CBC) 7.3  RBC (CBC)  2.66  Hemoglobin (CBC)  8.6  Hematocrit (CBC)  25.1  Platelet Count (CBC)  100  MCV 95  MCH 32.3  MCHC 34.1  RDW 14.3  Neutrophil % 55.3  Lymphocyte % 33.6  Monocyte % 8.5  Eosinophil % 2.0  Basophil % 0.6  Neutrophil # 4.1  Lymphocyte # 2.5  Monocyte # 0.6  Eosinophil # 0.1  Basophil # 0.0 (Result(s) reported on 22 Jul 2013 at 04:52AM.)   Assessment/Plan:  Assessment/Plan:  Assessment Anastomotic ulcer. On PPI.   Plan Soft diet ordered. Carafate added. Evaluation for kidney stone if left flank pain persists. Thanks.   Electronic Signatures: OVerdie Shire(MD)  (Signed 14-Dec-14 10:01)  Authored: Chief Complaint, VITAL SIGNS/ANCILLARY NOTES, Brief Assessment, Lab Results, Assessment/Plan   Last Updated: 14-Dec-14 10:01 by OVerdie Shire(MD)

## 2014-11-29 NOTE — H&P (Signed)
PATIENT NAME:  Justin Patel, Justin Patel MR#:  [id number] DATE OF BIRTH:  10/02/1950  DATE OF ADMISSION:  07/21/2013  PRIMARY CARE PHYSICIAN: With [facility].   REFERRING EMERGENCY ROOM PHYSICIAN: Doren Custard A. Joni Fears, MD  CHIEF COMPLAINT: Dark stools and passing-out episode.   HISTORY OF PRESENTING ILLNESS: A 64 year old male with a past medical history of recurrent GI bleeds, gastric bypass surgery, Roux-en-Y gastric bypass and bleeding gastric ulcer, systemic hypertension, diabetes, severe diverticulosis and chronic back pain. Today morning, woke up to go to the bathroom, but in the bathroom, he passed out and fell in the bathtub. His wife went over there to check on him, and he was unconscious over there, having jerky movements of his limbs, and his color turned totally white, so she got scared. His breathing was labored at the time, and she called EMS at the same time. Finally, she managed to get him to lie down straight in the tub, but he was still confused, and during this episode, he had passed some stool. She noticed the stool was dark maroon color, and EMS brought him to the Emergency Room. On further checkup, his hemoglobin dropped from 13, which was in July last year, to 9.6 today, feeling very weak. On further questioning, the patient denies any burning in the stomach or any dark stool noticed in the last few days. He denies taking any over-the-counter pain medication. He is on prescription Ultram for his back pain. Does not take [facility] Powder or aspirin and does not have any history of seizures in the past. About today's episode, he only remembers going to the bathroom and passing out. After that, when he was confused and semiconscious, he does not remember what happened during the episode.   REVIEW OF SYSTEMS:  CONSTITUTIONAL: Negative for fever or fatigue. Positive for generalized weakness. No pain or weight loss.  EYES: No blurred or double vision, discharge or redness.  ENT: No tinnitus, ear  pain, hearing loss.  RESPIRATORY: No cough, wheezing, hemoptysis or shortness of breath.  CARDIOVASCULAR: No chest pain, orthopnea, edema or palpitations.  GASTROINTESTINAL: No nausea, vomiting, diarrhea or abdominal pain.  GENITOURINARY: No dysuria, hematuria or increased frequency.  ENDOCRINE: No increased sweating. No heat or cold intolerance.  SKIN: No acne, rashes or lesions on the skin.  MUSCULOSKELETAL: No pain or swelling in the joints.  NEUROLOGICAL: No numbness, weakness, tremors or vertigo.  PSYCHIATRIC: No anxiety, insomnia or bipolar disorder.   PAST MEDICAL HISTORY:  1. History of recurrent GI bleed.  2. Gastric bypass surgery in 2010, Roux-en-Y gastric bypass.  3. Systemic hypertension.  4. Diabetes mellitus type 2.  5. Past history of depression.  6. Gastroesophageal reflux disease.   7. Severe diverticulosis.  8. Chronic back pain.  9. Hyperlipidemia.  10. Diabetic neuropathy.  11. Degenerative osteoarthritis.  12. History of kidney stones.  13. History of hemorrhoids.   PAST SURGICAL HISTORY: Neck surgery for cervical vertebral fusion from C3 to C7, gastric bypass surgery, left leg osteotomy to realign the joint to prevent arthritis.   FAMILY HISTORY: Father died at age 64 of heart attack. Mother died at age 64 for complications of Alzheimer's dementia.   SOCIAL HISTORY: Nonsmoker. No history of alcohol or drug abuse. He is married, living with wife. 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 Ultram 50 mg oral tablet 3 tablets once a day as needed.  2. Sucralfate 1 gram oral tablet 2 times  a day.  3. Metoprolol succinate 200 mg oral tablet once a day.  4. Losartan 100 mg tablet once a day.  5. Levemir 26 units subcutaneous once a day at bedtime.  6. Hydralazine 100 mg oral tablet 3 times a day.  7. Furosemide 20 mg once a day. 8. Cymbalta 60 mg once a day.  9. Crestor 10 mg once a day.  PHYSICAL  EXAMINATION: VITAL SIGNS: In ER, temperature 97.8, pulse 72, respiration 22, blood pressure 135/77 and pulse oximetry 98 on room air.  GENERAL: The patient is fully alert and oriented to time, place and person. Does not appear in any acute distress.  HEAD AND NECK: Atraumatic. Conjunctivae pale. Oral mucosa moist. Neck is supple. No JVD.  RESPIRATORY: Bilaterally clear and equal air entry.  CARDIOVASCULAR: S1, S2 present, regular. No murmur.  ABDOMEN: Scar of the surgery present, nontender. Bowel sounds present. No organomegaly.  SKIN: Scar of surgery present in abdomen and left side of his neck. No rashes.  LEGS: No edema.  JOINTS: No swelling or tenderness.  NEUROLOGICAL: Power 5 out of 5. Moves all 4 limbs. Follows commands. No tremors or rigidity.  PSYCHIATRIC: Does not appear in any acute psychiatric illness at this time.   IMPORTANT LABORATORY RESULTS: Glucose 305, BUN 37, creatinine 1.59, sodium 142, potassium 4, chloride 108, CO2 26, calcium 7.9, albumin 2.5, bilirubin 0.3. WBC 9.8, hemoglobin 9.6, platelet count 175, MCV 97.   ASSESSMENT AND PLAN: A 64 year old male with past medical history of recurrent gastrointestinal bleed and gastric bypass surgery, who came to the hospital after having an episode of passing out with jerking movements and had passed stool during the episode which was dark-colored, and there is a drop in the hemoglobin and the patient is feeling weak.   1. Gastrointestinal bleed. This patient had history of recurrent gastrointestinal bleed. Will admit him with Protonix IV drip and GI consult. Will keep him n.p.o. except medication for now and give him IV fluids. Crossmatched already by ER physician. Currently, no need for transfusion as hemoglobin is still above the range for transfusion, but will check it every 12 hours, and if required, then he might get it. Will avoid any anticoagulation at this time.  2. Episode of passing out and possible seizurelike activity, as  wife typically describes him being unconscious and confused after the episode and having stiff limbs with jerky movements and had incontinence of his stool during the episode. He never had history of seizures, so I would like not to start any medication at this time, but will call neurologist to evaluate him for any further need or further recommendation. Meanwhile, I will get CT of the head.  3. Hypertension. As he will be n.p.o. and had gastrointestinal bleed, we are holding metoprolol and hydralazine, but will continue losartan. If blood pressure goes high, we might need to add his home medication.  4. Diabetes. The patient will be n.p.o., so will just keep him on insulin sliding scale coverage, but will not give long-acting Levemir as he was taking at home.  5. Hyperlipidemia. Will continue Crestor  6. Acute renal insufficiency. His creatinine in the past was normal; today, is 1.5. Will give him IV fluids and check it tomorrow   CODE STATUS: Full code.   Condition and plan discussed with the patient and his wife, who is present in the room.   TOTAL TIME SPENT ON THIS ADMISSION: 50 minutes.   ____________________________ Ceasar Lund Anselm Jungling, MD [id number] D: 07/21/2013  09:10:20 ET T: 07/21/2013 09:24:37 ET JOB#: [id number]  cc: Ceasar Lund. Anselm Jungling, MD, <Dictator> Vaughan Basta MD ELECTRONICALLY SIGNED 07/30/2013 14:25

## 2014-11-29 NOTE — Consult Note (Signed)
PATIENT NAME:  Justin Patel, Justin Patel MR#:  [id number] DATE OF BIRTH:  [birth year]  DATE OF CONSULTATION:  07/21/2013  REFERRING PHYSICIAN:   CONSULTING PHYSICIAN:  Lupita Dawn. Candace Cruise, MD  REASON FOR REFERRAL: Syncope, melena.   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 He had a large amount of hematochezia the night before, before he came in, then this morning when he tried to  go to the bathroom in the bathroom the patient had a syncopal episode. When he was found to be unconscious EMS was called. On the way to the hospital, he had another bout of bleeding and had another syncopal episode. In the Emergency Room, his hemoglobin was found to be 9.6. He still feels weak and lightheaded, especially when he stands up. He does admit to having seen a few episodes of dark bloody stools since last night. He denies any aspirin or NSAID use. However, he does have a known history of bleeding from the anastomosis.   Normally he takes Dexilant and Carafate at home. Unfortunately, due to change in insurance, his insurance would not cover for them; therefore, he stopped taking those medicines about a month ago. This is similar to what happened when he had his last endoscopy in 2013 when he ran out of medications and was not taking them.   REVIEW OF SYSTEMS: There are no fevers or chills, but he does complain of weakness. There are no weight changes. There are no visual or hearing losses. There is no coughing or shortness of breath, chest pain or palpitations. There is no nausea, vomiting, or abdominal pain. The rest of the review of symptoms are negative.   PAST MEDICAL HISTORY: Gastric bypass surgery in 2010. His initial weight was 350 pounds, now he is down to 180 pounds. Other history includes diabetes and hypertension and history of reflux. He does have history of colon polyps and diverticulosis. He had a colonoscopy by Dr. Allen Norris in 2007  which showed some diverticulosis. He also had a repeat colonoscopy by Dr. Dionne Milo in 2012. No polyps were seen, according to the patient. Diverticulosis was still present. I did not have the records of this to confirm.   PAST SURGICAL HISTORY: Includes neck surgery, bypass surgery.   FAMILY HISTORY: Notable for heart disease and dementia.   SOCIAL HISTORY: The patient denies alcohol and smoking.   HOME MEDICATIONS: Include Carafate and Dexilant, which he is not taking. Also takes Ultram as needed, losartan 100 mg daily, metoprolol 200 mg daily, Levemir 26 units at bedtime, hydralazine 100 mg 3 times a day, Lasix 20 mg daily, Cymbalta 60 mg daily, and Crestor 10 mg daily.   PHYSICAL EXAMINATION: GENERAL: The patient is somewhat pale. He is in no acute distress.  VITAL SIGNS: Are stable at this time.  HEAD AND NECK: Within normal limits.  CARDIAC: Regular rhythm and rate.  LUNGS: Clear bilaterally.  ABDOMEN: Shows a surgical scar, but is soft and nontender. There is no hepatomegaly. He has active bowel sounds.  EXTREMITIES: Show no clubbing, cyanosis, or edema.   LABORATORY AND DIAGNOSTICS: Sodium 142, potassium 4, chloride 108, CO2 26, BUN 37, creatinine 1.59, and glucose 305. Liver enzymes are normal. White count is 9.8, hemoglobin 9.6.   ASSESSMENT AND PLAN: This is a patient with acute gastrointestinal bleeding since last night with associated syncopal episodes. The patient is no longer on any Carafate or proton pump inhibitors.  Therefore, I  am concerned that he may be having bleeding again from anastomosis where he had an ulcer in the past. The patient has not had anything to eat or drink since p.m. last night. We will go ahead and try to arrange an upper endoscopy this morning to see whether I can find the source of bleeding and cauterize it if necessary. In the meantime, the patient should be placed on proton pump inhibitor. Need to monitor hemoglobins, which are likely to fall. The  patient will likely need blood transfusions. Thank you for the referral.  ____________________________ Lupita Dawn. Candace Cruise, MD pyo:sb D: 07/23/2013 07:36:00 ET T: 07/23/2013 07:57:03 ET JOB#: [id number]  cc: Lupita Dawn. Candace Cruise, MD, <Dictator> Lupita Dawn Dravin Lance MD ELECTRONICALLY SIGNED 07/25/2013 9:09

## 2014-11-29 NOTE — Consult Note (Signed)
EGD showed anastomotic ulcer with adherent clot. Clot removed and area cauterized. Clear liquid diet. Moniter hgb. Thanks  Electronic Signatures: Verdie Shire (MD)  (Signed on 13-Dec-14 11:38)  Authored  Last Updated: 13-Dec-14 11:38 by Verdie Shire (MD)

## 2014-11-29 NOTE — Discharge Summary (Signed)
PATIENT NAME:  Justin Patel, Justin Patel MR#:  [id number] DATE OF BIRTH:  1951/03/15  DATE OF ADMISSION:  07/21/2013 DATE OF DISCHARGE:  07/25/2013  DISCHARGE DIAGNOSES: 1.  Acute blood loss anemia, likely from ulcer, now stable.  2.  Upper gastrointestinal bleed, status post esophagogastroduodenoscopy showing ulcer with cauterization of vessels, status post 3 units of total red blood cell transfusion.  No subsequent bleed.  Syncopal episode likely due to acute blood loss, now resolved.  3.  Acute renal failure, likely prerenal, now resolved with hydration.  4.  Left-sided flank pain with renal ultrasound showing multiple cysts, biggest being 5 cm on the left side.  No stones.  Urinalysis being negative.  5.  Constipation, now resolved.   SECONDARY DIAGNOSES: 1.  History of recurrent gastrointestinal bleed.  2.  Gastric bypass surgery in 2010.  3.  Hypertension.  4.  Diabetes.  5.  History of depression.  6.  Gastroesophageal reflux disease.  7.  Severe diverticulosis.  8.  Chronic back pain.  9.  Hyperlipidemia.  10.  Diabetic neuropathy.  11.  Degenerative joint disease.  12.  History of kidney stones.  13.  Hemorrhoids.   CONSULTATIONS: 1.  GI, Dr. Candace Cruise.  2.  Neurology, Dr. Arna Medici.   PROCEDURES AND RADIOLOGY:  1.  EGD by Dr. Candace Cruise on 13th of December showed anastomotic ulcer with adherent clot which was removed and area was cauterized.  2.  CT scan of the head without contrast on 13th of December showed no evidence of acute intracranial abnormalities.  3.  Bilateral kidney ultrasound on 15th of December showed no hydronephrosis.  Multiple bilateral benign-appearing renal cysts.  The largest was in the mid pole of the left kidney measuring up to 5 cm.  4.  UA on 15th of December was negative.   HISTORY AND SHORT HOSPITAL COURSE:  The patient is a 64 year old male with above-mentioned medical problems who was admitted for GI bleed with questionable syncope.  Please see Dr. Nichola Sizer  dictated history and physical for further details.  GI consultation was obtained with Dr. Verdie Shire who recommended endoscopy which was performed on 13th of December with results dictated above.  The patient had a severe acute blood loss anemia on admission which required a total of 3 units of packed red blood cell transfusion.  Neurological consultation was obtained with Dr. Arna Medici who felt the patient's syncope was more likely from acute blood loss.  He did not think this to be a seizure.  The patient was slowly advanced on diet and was tolerating it okay.  He did finally get a bowel movement on 17th of December and had tolerated diet fine and is being discharged home in stable condition.  He was somewhat concerned about possible kidney stone as he was having left-sided flank pain which actually was resolved with his kidney ultrasound not showing any stones.  On the date of discharge his vital signs were as follows, temperature 98.5, heart rate 62 per minute, respirations 16 per minute, blood pressure 121/76 mmHg.  He was saturating 95% on room air.   PERTINENT PHYSICAL EXAMINATION ON THE DATE OF DISCHARGE:  CARDIOVASCULAR:  S1, S2 normal.  No murmurs, rubs, or gallop.  LUNGS:  Clear to auscultation bilaterally.  No wheezing, rales, rhonchi, or crepitation.  ABDOMEN:  Soft, benign.  NEUROLOGIC:  Nonfocal examination.  All other physical examination remained at baseline.   DISCHARGE MEDICATIONS: 1.  Cymbalta 60 mg by mouth daily.  2.  Lasix 20 mg by mouth daily.  3.  Crestor 10 mg by mouth at bedtime.  4.  Losartan 100 mg by mouth daily.  5.  Sucralfate 1 gram by mouth twice daily.  6.  Ultram 50 mg by mouth 3 tablets daily as needed.  7.  Insulin Levemir 10 units subQ at bedtime.  8.  Metoprolol 50 mg by mouth twice daily.  9.  Hydralazine 50 mg by mouth 4 times a day.  10.  Prilosec 40 mg by mouth twice daily.    DISCHARGE DIET:  Low sodium, low fat, low cholesterol 1800 ADA.    DISCHARGE ACTIVITY:  As tolerated.   DISCHARGE INSTRUCTIONS AND FOLLOW-UP:  The patient was instructed to follow up with his primary care physician, Dr. Lamonte Sakai in 1 to 2 weeks.  He was instructed to follow up with Dr. Verdie Shire from [facility] in 2 to 4 weeks.  Please note, we made some changes in his home medication including metoprolol and insulin Levemir as his blood sugar had been running fairly well-controlled in 90s to 110, so the dose was decreased from 26 to 10 units along with his heart rate running low so his metoprolol dose was reduced.  His blood pressure was somewhat elevated for which his hydralazine was increased to 4 times a day.  The patient was made aware of this and he was agreeable with the same change.   Total time discharging this patient was 55 minutes.    ____________________________ Lucina Mellow. Manuella Ghazi, MD vss:ea D: 07/25/2013 22:11:05 ET T: 07/26/2013 01:34:33 ET JOB#: [id number]  cc: Zakara Parkey S. Manuella Ghazi, MD, <Dictator> Perrin Maltese, MD Lupita Dawn. Candace Cruise, MD Remer Macho MD ELECTRONICALLY SIGNED 07/30/2013 7:51

## 2014-11-29 NOTE — Consult Note (Signed)
Pt seen and examined. Full consult to follow. Pt had a large bout of hematochezia last night. This AM, passed out on way to [facility]. Had another bout of rectal bleeding. EMS called. On the way, had another bout of syncope. No abd pain. No nausea. 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 Was taking dexilant and carafate until a month ago and then ran out. This is similar in hx to last year's when he had bleeding from ulcer. Known hx of colon polyps and diverticulosis. Last colon done by Dr. Dionne Milo in 2102. No polyps then. No food or drink since 11 PM last night. BP/HR ok right now. Likely UGI bleed but diverticular bleed also possible. Keep patient NPO, IV hydration, PPI IV, and plan EGD this AM. Will follow. Thanks.  Electronic Signatures: Verdie Shire (MD)  (Signed on 13-Dec-14 08:44)  Authored  Last Updated: 13-Dec-14 08:44 by Verdie Shire (MD)

## 2014-11-30 NOTE — Op Note (Signed)
PATIENT NAME:  Justin Patel, CHICOINE MR#:  [id number] DATE OF BIRTH:  1950/10/20  DATE OF PROCEDURE:  05/15/2014  PREOPERATIVE DIAGNOSIS: Degenerative arthrosis of the left knee, status post opening wedge proximal tibial osteotomy.   POSTOPERATIVE DIAGNOSIS: Degenerative arthrosis of the left knee, status post opening wedge proximal tibial osteotomy.   PROCEDURE PERFORMED:  Left total knee arthroplasty using computer-assisted navigation.   SURGEON: Laurice Record. Holley Bouche., MD  ASSISTANT: Vance Peper, PA (required to maintain retraction throughout the procedure).   ANESTHESIA: Spinal.   ESTIMATED BLOOD LOSS: 100 mL.   FLUIDS REPLACED: 2000 mL of crystalloid.   TOURNIQUET TIME: 117 minutes.   DRAINS: Two medium drains to reinfusion system.   SOFT TISSUE RELEASES: Anterior cruciate ligament, posterior cruciate ligament, deep medial collateral ligament, patellofemoral ligament, and posterolateral corner.   IMPLANTS UTILIZED: DePuy PFC Sigma size 4 posterior stabilized femoral component (cemented), size 3 MBT tibial component (cemented), 32 mm 3 peg oval dome patella (cemented), and a 10 mm stabilized rotating platform polyethylene insert.   INDICATIONS FOR SURGERY: The patient is a 64 year old male who previously underwent an opening wedge proximal tibial osteotomy as per Dr. Earnestine Leys approximately 9 years ago. The patient has had valgus deformity of the knee with progressive pain. X-rays demonstrated severe degenerative changes. He had previously undergone removal of the hardware. Risks and benefits of surgical intervention were discussed with the patient. He expressed understanding of the risks and benefits, and agreed with plans for surgical intervention.   PROCEDURE IN DETAIL: The patient was brought to the operating room and after adequate spinal anesthesia was achieved, a tourniquet was placed on the patient's upper left thigh. The patient's left knee and leg were cleaned and prepped with  alcohol and DuraPrep, draped in the usual sterile fashion. A "timeout" was performed as per usual protocol. The left lower extremity was exsanguinated using an Esmarch, and the tourniquet was inflated to 300 mmHg.   An anterior incision was made just medial to the patella so as to incorporate the previous surgical incision from the proximal tibial osteotomy. A standard mid vastus approach was utilized. Moderate effusion was evacuated. The deep fibers of the medial collateral ligament were elevated in a subperiosteal fashion off the medial flare of the tibia so as to maintain a continuous soft tissue sleeve. The patella was subluxed laterally and the patellofemoral ligament was incised. Inspection of the knee demonstrated severe degenerative changes with full-thickness loss of articular cartilage to the 3 compartments. Prominent osteophytes were debrided using a rongeur. Anterior and posterior cruciate ligaments were excised. Two 4.0 mm Schanz pins were inserted into the femur and into the tibia for attachment of the array of trackers used for computer-assisted navigation. Hip center was identified using circumduction technique. Distal landmarks were mapped using the computer. The distal femur and proximal tibia were mapped using the computer. Distal femoral cutting guide was positioned using computer-assisted navigation so as to achieve a 5 degree distal valgus cut. Cut was performed and verified using the computer. Next, the extramedullary tibial cutting guide was positioned using computer-assisted navigation so as to achieve 0 degree varus, valgus alignment and 0 degree posterior slope. Cut was performed and verified using the computer. The knee was placed in flexion and Moreland retractors were placed so as to visualize the flexion gap. Given the valgus deformity from the osteotomy, it was elected to slightly internally rotate the femoral component. The distal femur was sized and it was felt that a size 4  femoral component was appropriate. A size four cutting guide was positioned and anterior cut was performed and verified using the computer. This was followed by completion of the posterior and chamfer cuts. Femoral cutting guide for the central box was then positioned, and the central box cut was performed. The proximal tibia was sized and it was felt that a size 3 tibial tray was appropriate. Tibial and femoral trials were inserted followed by insertion of a 10 mm polyethylene trial. The knee was felt to be tight laterally and full extension could not be achieved. Trial components were removed and the knee was placed into full extension with distraction performed using Moreland retractors. The posterolateral corner was carefully released so as to achieve good medial and lateral soft tissue balancing. Trial components were reinserted, with significant improvement of mediolateral soft tissue balancing. However, it was still difficult to achieve full extension. The extramedullary tibial cutting guide was repositioned so as to resect an additional 2 mm of bone. This was verified using the computer. Tibial and femoral trials were inserted followed by insertion of a 10 mm polyethylene insert. Full extension was achieved with excellent mediolateral soft tissue balancing noted both with flexion and extension. Finally, the patella was cut and prepared so as to accommodate a 32 mm 3 peg oval domed patella. Patellar trial was placed and the knee was placed through a range of motion with excellent patellar tracking appreciated.   Femoral trial was removed. Central post hole for the tibial component was reamed followed by insertion of a keel punch. Tibial trial was then removed. The cut surfaces of bone were irrigated with copious amounts of normal saline with antibiotic solution using pulsatile lavage then suctioned dry. Polymethylmethacrylate cement with gentamicin was prepared in the usual fashion using a vacuum mixer.  Cement was applied to the cut surface of the proximal tibia as well as along the undersurface of a size 3 MBT tibial component. The tibial component was positioned and impacted into place. Excess cement was removed using [employer] fast streamer. Cement was then applied to the cut surface of the femur as well as along the posterior flanges of a size 4 posterior stabilized femoral component. Femoral component was positioned and impacted into place. Excess cement was removed using [employer] fast streamer. A 10 mm polyethylene trial was inserted and the knee was brought into full extension with steady axial compression applied. Finally, cement was applied to the backside of a 32 mm 3 peg oval domed patella and the patellar component was positioned and patellar clamp applied. Excess cement was removed using [employer] fast streamer.   After adequate curing of cement, the tourniquet was deflated after total tourniquet time of 117 minutes. Hemostasis was achieved using electrocautery. The knee was irrigated with copious amounts of normal saline with antibiotic solution using pulsatile lavage and then suctioned dry. The knee was inspected for any residual cement debris. Exparel 1.3%, 20 mL, and 40 mL of normal saline was injected along the posterior capsule, medial and lateral gutters, and along the arthrotomy site. A 10 mm stabilized rotating platform polyethylene insert was inserted and the knee was placed through a range of motion. Excellent mediolateral soft tissue balance was appreciated in both flexion and extension. Excellent patellar tracking was appreciated.   Two medium drains were placed in the wound bed and brought out through a separate stab incision to be attached to a reinfusion system. The medial parapatellar portion of the incision was reapproximated using interrupted sutures of #1 Vicryl. Then, 30 mL  of 0.25% Marcaine with epinephrine was injected along the subcutaneous tissue. The subcutaneous tissue was then approximated in  layers using first #0 Vicryl, followed by #2-0 Vicryl. Skin was closed with skin staples. A sterile dressing was applied.   The patient tolerated the procedure well. He was transported to the recovery room in stable condition.    ____________________________ Laurice Record. Holley Bouche., MD jph:LT D: 05/15/2014 18:56:34 ET T: 05/15/2014 21:09:16 ET JOB#: [id number]  cc: Laurice Record. Holley Bouche., MD, <Dictator> JAMES P Holley Bouche MD ELECTRONICALLY SIGNED 05/19/2014 9:32

## 2014-11-30 NOTE — Op Note (Signed)
PATIENT NAME:  Justin Patel, Justin Patel MR#:  [id number] DATE OF BIRTH:  1951/06/13  DATE OF PROCEDURE:  03/18/2014  PREOPERATIVE DIAGNOSIS: Retained hardware status post left opening wedge proximal tibial osteotomy.   POSTOPERATIVE DIAGNOSIS: Retained hardware status post left opening wedge proximal tibial osteotomy.   PROCEDURE PERFORMED: Removal of retained hardware from the left proximal tibia.   SURGEON: Laurice Record. Holley Bouche., MD  ANESTHESIA: General.   ESTIMATED BLOOD LOSS: Minimal.   FLUIDS REPLACED: 1200 mL of crystalloid.  TOURNIQUET TIME: 85 minutes.   DRAINS: None.  INDICATIONS FOR SURGERY: The patient is a 64 year old male who underwent a left opening wedge proximal tibial osteotomy approximately 13 years ago. He has had progressive increased pain as well as decreased range of motion of the knee. X-rays demonstrated severe degenerative changes in the knee. Recommendation was made for a staged approach with removal of the retained hardware and allowing for appropriate healing before conversion to a total knee arthroplasty. The risks and benefits of surgical intervention were discussed with the patient. The patient expressed understanding of the risks, benefits, and agreed with plans for surgical intervention.   PROCEDURE IN DETAIL: The patient was brought to the operating room and, after adequate general anesthesia was achieved, a tourniquet was placed on the patient's upper left thigh. The patient's left knee and leg were cleaned and prepped with alcohol and DuraPrep and draped in the usual sterile fashion. A "timeout" was performed as per usual protocol. The left lower extremity was exsanguinated using an Esmarch, and the tourniquet was inflated to 300 mmHg. An anterior longitudinal incision was made in line with the previous surgical scar on the anteromedial aspect of the proximal tibia. Dissection was carried out to the previously placed plate. There was some bony overgrowth noted. The  Six 6.0 mm cancellous screws were removed without difficulty. An osteotome was used to carefully elevate the overgrown bone off of the surface of the plate. Then, using a combination of osteotome and curettes, the borders of the plate were cleared of overgrown bone. The plate was then carefully levered off of the cortex. There appeared to be no evidence of infection. Good bony consolidation at the osteotomy site was noted. The wound was irrigated with copious amounts of normal saline with antibiotic solution. The deep layer was reapproximated using interrupted sutures of #1 Vicryl. Tourniquet was deflated after a total tourniquet time of 85 minutes. Hemostasis was achieved using electrocautery. The wound was then closed in layers using first #0 Vicryl followed by #2-0 Vicryl. Then, 30 mL of 0.25% Marcaine with epinephrine was injected along the incision site and into the deeper structures. A sterile dressing was applied after approximation of the skin edges with skin staples.   The patient tolerated the procedure well. He was transported to the recovery room in stable condition.   ____________________________ Laurice Record. Holley Bouche., MD jph:sk D: 03/18/2014 18:57:27 ET T: 03/19/2014 00:31:08 ET JOB#: [id number]  cc: Laurice Record. Holley Bouche., MD, <Dictator> Laurice Record Holley Bouche MD ELECTRONICALLY SIGNED 03/20/2014 21:47

## 2014-11-30 NOTE — Discharge Summary (Signed)
PATIENT NAME:  Justin Patel, Justin Patel MR#:  [id number] DATE OF BIRTH:  Oct 01, 1950  DATE OF ADMISSION:  05/15/2014 DATE OF DISCHARGE:  05/18/2014  ADMITTING DIAGNOSIS: Degenerative arthrosis of the left knee.   DISCHARGE DIAGNOSIS: Degenerative arthrosis of the left knee.   HISTORY OF PRESENT ILLNESS: The patient is a 64 year old who has been followed at [facility] for progression of left knee pain. The patient had previously underwent an opening wedge proximal tibia osteotomy years ago by Dr. Earnestine Leys. Approximately 6 weeks ago he underwent removal of the hardware of the proximal tibia. He now has presented for surgery for total knee arthroplasty. The patient had denied any problems from his previous surgical incision. He denied significant swelling. He has continued to have complaints of significant left knee pain with weight-bearing activities. He also had noted significant valgus deformity of his left knee secondary to the opening wedge proximal tibial osteotomy performed in the past. At the time of surgery, he was not using any ambulatory aids. He states that the left knee pain had progressed to the point that it was significantly interfering with his activities of daily living. X-rays taken in [place] showed narrowing of the medial and lateral compartment space. Varus alignment was present due to the previous proximal tibial osteotomy. Prominent posterior slope of the tibial plateau was noted. Osteophyte as well as subchondral sclerosis was noted. After discussion of the risks and benefits of surgical intervention, the patient expressed his understanding of the risks and benefits and agreed for plans for surgical intervention.   PROCEDURE: Left total knee arthroplasty using computer-assisted navigation.   ANESTHESIA: Spinal.   SOFT TISSUE RELEASE: Anterior cruciate ligament, posterior cruciate ligament, deep medial collateral ligament, patellofemoral ligament, and the  posterior lateral corner.   IMPLANTS UTILIZED: DePuy PFC Sigma size 4 posterior stabilized femoral component (cemented), size 3 MBT tibial component (cemented), 32 mm three-pegged oval dome patella (cemented), and a 10 mm stabilized rotating platform polyethylene insert.   HOSPITAL COURSE: The patient tolerated the procedure very well. He had no complications. He was then taken to the PAC-U where he was stabilized and then transferred to the orthopedic floor. He began receiving anticoagulation therapy of Lovenox 30 mg subcutaneous every 12 hours per anesthesia and pharmacy protocol. He was fitted with TED stockings bilaterally. These were allowed to be removed 1 hour per 8 hour shift. The left one was applied on day 2 following removal of the Hemovac and dressing change. The patient was also fitted with the AV-I compression foot pumps bilaterally set at 80 mmHg. His calves have been nontender. There has been no evidence of any DVTs. Negative Homans sign. Heels were elevated off the bed using rolled towels.   The patient has denied any chest pains or shortness of breath. Vital signs have been stable. He has been afebrile. Hemodynamically he was stable and no transfusions were given other than the Autovac transfusion given the first 6 hours postoperatively.   Physical therapy was initiated on day 1 for gait training and transfers. He has done very well. He has had no complications. Upon being discharged he was ambulating greater than 200 feet. He was able go up and down 4 sets of steps. He was independent with bed to chair transfers. Occupational therapy was also initiated on day 1 for ADL and assistive devices. He has done very well with the rehab. There have been no complications.   The patient's IV, Foley and Hemovac were DC'd on day 2  along with the dressing change. The wound was free of any drainage or signs of infection. The Polar Care was reapplied to the surgical leg maintaining a temperature of 40  to 50 degrees Fahrenheit.   DISPOSITION: The patient is being discharged to home in improved stable condition.   DISCHARGE INSTRUCTIONS: He may weight bear as tolerated. Continue using a walker until cleared by physical therapy to go to a quad cane. He will receive home health PT. Elevate the lower extremities. TED stockings are to be worn during the day. These may be removed at night. Incentive spirometer q. 1 while awake. We also encouraged the patient to do cough and deep breathing q. 2 hours while awake. He is placed on a diabetic diet. He is to continue using the Polar Care maintaining a temperature of 40 to 50 degrees Fahrenheit. He was instructed on wound care. He has a follow-up appointment on October 20th at 9:15. He is to call sooner if any temperatures of 101.5 or greater or excessive bleeding.   DRUG ALLERGIES: ACE INHIBITORS, ALTACE, CONTRAST DYE, IODINE, LEVAQUIN.   MEDICATIONS: The patient is to resume his regular medications that he was on prior to admission. He was given a prescription for Lovenox 40 mg subcutaneously daily for 14 days and then discontinue and begin taking one 81 mg enteric-coated aspirin and a prescription for Roxicodone 5 to 10 mg every 4 to 6 hours p.r.n. for pain and Tramadol 50 to 100 mg every 4 to 6 hours p.r.n. for pain.   PAST MEDICAL HISTORY: Diabetes mellitus, hypertension, depression, hypercholesterolemia diverticulosis, hiatal hernia, sleep apnea, congestive heart failure.  ____________________________ Vance Peper, PA jrw:sb D: 05/17/2014 08:09:00 ET T: 05/17/2014 09:35:48 ET JOB#: [id number]  cc: Vance Peper, PA, <Dictator> Lulamae Skorupski PA ELECTRONICALLY SIGNED 05/21/2014 8:12

## 2014-12-01 NOTE — Discharge Summary (Signed)
PATIENT NAME:  Justin Patel, Justin Patel MR#:  [id number] DATE OF BIRTH:  12/09/1950  DATE OF ADMISSION:  11/11/2011 DATE OF DISCHARGE:  11/16/2011  DISCHARGE DIAGNOSES:  1. Acute gastrointestinal bleed.  2. Esophageal motility disorder.  3. Pneumonia, left lower lobe.  4. Hypertension.  5. Diabetes mellitus.  6. Chronic pain.   DISCHARGE MEDICATIONS: The patient is to resume home doses of Cymbalta, furosemide, Crestor, fluticasone, and Levemir. Please see discharge instructions for full details in this regard. Additionally, the patient is to begin taking the following medicines:   1. Metoprolol succinate 100 mg, take one by mouth once a day. DECREASING DOSE. 2. Hydralazine 50 mg, take one by mouth t.i.d.  3. Losartan 100 mg, take one by mouth once a day. INCREASING DOSE. 4. Pantoprazole 40 mg, take one by mouth b.i.d.   5. Sucralfate 1 gram, take one by mouth q.i.d. as directed.  6. Prednisone 40 mg, take one by mouth once a day for two more days.  7. Amoxicillin/potassium clavulanate 875/125 mg, take one by mouth b.i.d. for two more days. 8.   hydrocodone/acetaminophen, 5/325 mg, Take one by mouth *up to* three times a day as needed for breakthrough pain.  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 Dionne Milo of gastroenterology on the same day. The patient initially presented to [facility] care with a chief complaint of melenic diarrhea and cough. He is known to have ulceration as aforementioned, and the patient also admitted he had not been compliant with his sucralfate therapy. The patient was found to have left lower lobe pneumonia, and given the concern for a gastrointestinal bleed, PA Prudence Davidson sent the patient to see Dr. Dionne Milo the same day. Dr. Dionne Milo  was also concerned of the same, and after consulting with PA Prudence Davidson, the decision was made to admit the patient to [facility]. Please see History and Physical examination from the date of admission for full details regarding his initial presentation, evaluation, and treatment once arriving to [facility]. The patient was treated for gastrointestinal bleed in the standard fashion with IV proton pump inhibitors, dietary changes, serial hemoglobins and following of hemodynamics, and the like. Overall his GI bleed was mild, and his hemoglobin at its lowest point was 11.3. He remained hemodynamically stable throughout his entire hospitalization and actually required adjustment of his blood pressure medications to keep his blood pressure from running too high. Gastroenterology did see the patient on his second day in the hospital and felt that the patient would be stable to wait to have his EGD until the following Monday. They also wanted to defer EGD until the patient had been treated for his pneumonia.   The patient was treated in the standard fashion for his pneumonia with antimicrobials, breathing treatments, and the like. Given his very remote history of asthma (i.e. he has not required any medications for his in many years), he was also given prednisone while in the hospital with additional appropriate coverage for any resulting hyperglycemia since he has diabetes. He showed rapid improvement with these interventions, and his pneumonia treatment was uncomplicated.  On Monday, April 8th, the patient did undergo EGD. Please see notes in the patient's chart for full details regarding the findings of the EGD, but briefly ulceration was noted.  There was no active bleeding noted, but signs of a recent bleed were noted. Additionally, the patient was noted to have an esophageal motility disorder. Gastroenterology felt the patient was stable to be discharged from the hospital  with recommendations that he should continue to take sucralfate, 1 gram, 1 tab by mouth q.i.d. Additionally, he is to take pantoprazole 40 mg, 1 tab by mouth b.i.d.  He will follow up with Gastroenterology as an outpatient. Likewise, he will follow up with Wilnette Kales, PA-C for continued Internal Medicine care.   On day of discharge, the patient was deemed to be in satisfactory condition to be discharged from the hospital. Overall, his course of care while in the hospital was uncomplicated. He was discharged from the hospital in satisfactory condition.   DIET: Advance diet as tolerated and as instructed by Gastroenterology.   ACTIVITY: As tolerated.   FOLLOWUP:  1. Follow up with Gastroenterology within two weeks of hospital discharge, sooner if needed. 2. Follow up with Wilnette Kales, PA-C for continued Internal Medicine care as scheduled, sooner if needed.   TIME SPENT ON DISCHARGE: Discharge time spent, including coordination of care, patient education, and the like was approximately 45 minutes.  ____________________________ Kizzie Furnish Audie Clear, MSPAS, dictating on behalf of Dr. Elijio Miles mgm:cbb D: 11/16/2011 08:54:39 ET T: 11/16/2011 15:08:06 ET JOB#: [id number]  cc: Kizzie Furnish. Prudence Davidson, PA, <Dictator> Elwyn Reach PA ELECTRONICALLY SIGNED 11/17/2011 11:57 Veverly Fells MD ELECTRONICALLY SIGNED 11/22/2011 13:27

## 2014-12-01 NOTE — Consult Note (Signed)
Chief Complaint:   Subjective/Chief Complaint doing well.  breathing much better, less abd pain3-4/10.  no n/v/ one black bm today, first since admit.   VITAL SIGNS/ANCILLARY NOTES: **Vital Signs.:   08-Apr-13 13:24   Vital Signs Type Routine   Temperature Temperature (F) 98.2   Celsius 36.7   Temperature Source oral   Pulse Pulse 57   Pulse source per Dinamap   Respirations Respirations 18   Systolic BP Systolic BP 537   Diastolic BP (mmHg) Diastolic BP (mmHg) 91   Mean BP 113   BP Source Dinamap   Pulse Ox % Pulse Ox % 97   Pulse Ox Activity Level  At rest   Oxygen Delivery Room Air/ 21 %  *Intake and Output.:   08-Apr-13 02:30   Stool  1 small stool patient report    10:52   Stool  small grainey textured dark black stool   Brief Assessment:   Cardiac Regular    Respiratory clear BS    Gastrointestinal details normal Soft  Nontender  Nondistended  No masses palpable  Bowel sounds normal   Routine Hem:  08-Apr-13 04:32    WBC (CBC) 9.5   RBC (CBC) 3.71   Hemoglobin (CBC) 11.6   Hematocrit (CBC) 35.5   Platelet Count (CBC) 178   MCV 96   MCH 31.2   MCHC 32.7   RDW 17.0  Routine Chem:  08-Apr-13 04:32    Glucose, Serum 84   BUN 16   Creatinine (comp) 1.02   Sodium, Serum 141   Potassium, Serum 3.9   Chloride, Serum 106   CO2, Serum 30   Calcium (Total), Serum 8.8   Osmolality (calc) 282   eGFR (African American) >60   eGFR (Non-African American) >60   Anion Gap 5  Routine Hem:  08-Apr-13 04:32    Neutrophil % 59.9   Lymphocyte % 30.1   Monocyte % 9.2   Eosinophil % 0.5   Basophil % 0.3   Neutrophil # 5.7   Lymphocyte # 2.8   Monocyte # 0.9   Eosinophil # 0.0   Basophil # 0.0  Blood Glucose:  08-Apr-13 07:26    POCT Blood Glucose 81    11:54    POCT Blood Glucose 89   Radiology Results: XRay:    08-Apr-13 11:19, Chest PA and Lateral   Chest PA and Lateral    REASON FOR EXAM:    pneumonia, compare to previous  COMMENTS:        PROCEDURE: DXR - DXR CHEST PA (OR AP) AND LATERAL  - Nov 15 2011 11:19AM     RESULT: The lung fields are clear. The heart, mediastinal and osseous   structures show no significant abnormalities.    IMPRESSION:   1. No pneumonia or other acute change is identified.  2. The lungs appear mildly hyperinflated bilaterally, but with no   progressive hyperinflation noted as compared to the prior exam of   04/06/2006.      Thank you for the opportunity to contribute to the care of your patient.           Verified By: Dionne Ano WALL, M.D., MD   Assessment/Plan:  Assessment/Plan:   Assessment 1) melena-stable.   2) pneumonia-improved.    Plan 1) egd today,  I have discussed the risks benefits and complications of egd to include not limited to bleeding infection perfotation and sedation and he wishes to proceeed.  further recs to follow.  Electronic Signatures: Skulskie, Martin (MD)  (Signed 08-Apr-13 15:47)  Authored: Chief Complaint, VITAL SIGNS/ANCILLARY NOTES, Brief Assessment, Lab Results, Radiology Results, Assessment/Plan   Last Updated: 08-Apr-13 15:47 by Skulskie, Martin (MD) 

## 2014-12-01 NOTE — Consult Note (Signed)
Chief Complaint:   Subjective/Chief Complaint 4/10 luq pain, no nausea, no vomiting, no black stools. shortness of breath improving.   VITAL SIGNS/ANCILLARY NOTES: **Vital Signs.:   07-Apr-13 13:30   Vital Signs Type Routine   Temperature Temperature (F) 97.6   Celsius 36.4   Temperature Source oral   Pulse Pulse 72   Pulse source per Dinamap   Respirations Respirations 18   Systolic BP Systolic BP 868   Diastolic BP (mmHg) Diastolic BP (mmHg) 94   Mean BP 114   Pulse Ox % Pulse Ox % 93   Pulse Ox Activity Level  At rest   Oxygen Delivery Room Air/ 21 %   Brief Assessment:   Cardiac Regular    Respiratory rhonchi  posterior left lung field    Gastrointestinal details normal Soft  Nondistended  No masses palpable  Bowel sounds normal  tpain to palpation in the luq, toward the epigastrum and ruq, less so   Routine Hem:  07-Apr-13 04:18    WBC (CBC) 10.5   RBC (CBC) 3.60   Hemoglobin (CBC) 11.3   Hematocrit (CBC) 34.2   Platelet Count (CBC) 173   MCV 95   MCH 31.3   MCHC 33.0   RDW 17.0  Routine Chem:  07-Apr-13 04:18    Glucose, Serum 105   BUN 22   Creatinine (comp) 0.98   Sodium, Serum 140   Potassium, Serum 4.3   Chloride, Serum 105   CO2, Serum 28   Calcium (Total), Serum 8.8   Osmolality (calc) 283   eGFR (African American) >60   eGFR (Non-African American) >60   Anion Gap 7  Routine Hem:  07-Apr-13 04:18    Neutrophil % 64.8   Lymphocyte % 27.1   Monocyte % 7.8   Eosinophil % 0.2   Basophil % 0.1   Neutrophil # 6.8   Lymphocyte # 2.8   Monocyte # 0.8   Eosinophil # 0.0   Basophil # 0.0  Blood Glucose:  07-Apr-13 07:27    POCT Blood Glucose 86   Assessment/Plan:  Assessment/Plan:   Assessment 1) abdominal pain, melena, in the setting of previous recent h/o ulceration at roux-en-y gastric bypass anastomosis 2) pneumonia    Plan 1) will check chest films in am, will place on schedule for egd as clinically feasible.  I have discussed the  risks benefits and complications of egd to include not limited to bleeding infection perforation and sedation and he wishes to proceed.   Electronic Signatures: Loistine Simas (MD)  (Signed 07-Apr-13 15:25)  Authored: Chief Complaint, VITAL SIGNS/ANCILLARY NOTES, Brief Assessment, Lab Results, Assessment/Plan   Last Updated: 07-Apr-13 15:25 by Loistine Simas (MD)

## 2014-12-01 NOTE — Consult Note (Signed)
Chief Complaint:   Subjective/Chief Complaint Please see full GI consult.  Patietn seen and examined.  Pateitn presented to PMD with black watery stools, also s/s of pneumonia.  Currently on iv ppi, stable from Gi aspect. On abx for pneumonia.  Feeling some better, no bm today.  Still with abdominal pain, luq, though less so.  continue ppi as you are.  Recommend EGD once clinically improved from pneumonia.  Following.   VITAL SIGNS/ANCILLARY NOTES: **Vital Signs.:   05-Apr-13 13:15   Vital Signs Type Routine   Temperature Temperature (F) 98.3   Celsius 36.8   Temperature Source oral   Pulse Pulse 64   Pulse source per Dinamap   Respirations Respirations 18   Systolic BP Systolic BP 295   Diastolic BP (mmHg) Diastolic BP (mmHg) 89   Mean BP 109   Pulse Ox % Pulse Ox % 92   Pulse Ox Activity Level  At rest   Oxygen Delivery Room Air/ 21 %   Brief Assessment:   Cardiac Regular    Respiratory clear BS    Gastrointestinal details normal Soft  Nondistended  No masses palpable  Bowel sounds normal  No rebound tenderness  tender to palpation luq   Routine Hem:  04-Apr-13 18:59    WBC (CBC) 9.2   RBC (CBC) 4.16   Hemoglobin (CBC) 12.9   Hematocrit (CBC) 39.2   Platelet Count (CBC) 169   MCV 94   MCH 31.0   MCHC 32.8   RDW 16.9  Routine Chem:  04-Apr-13 18:59    Glucose, Serum 122   BUN 41   Creatinine (comp) 1.11   Sodium, Serum 141   Potassium, Serum 4.7   Chloride, Serum 107   CO2, Serum 26   Calcium (Total), Serum 8.9  Hepatic:  04-Apr-13 18:59    Bilirubin, Total 0.3   Alkaline Phosphatase 55   SGPT (ALT) 37   SGOT (AST) 19   Total Protein, Serum 7.1   Albumin, Serum 3.6  Routine Chem:  04-Apr-13 18:59    Osmolality (calc) 293   eGFR (African American) >60   eGFR (Non-African American) >60   Anion Gap 8  Cardiac:  04-Apr-13 18:59    Troponin I < 0.02  Routine Coag:  04-Apr-13 19:38    Prothrombin 13.0   INR 0.9   Activated PTT (APTT) 25.5  Routine  BB:  04-Apr-13 19:38    Antibody Screen NEGATIVE  Blood Glucose:  04-Apr-13 22:07    POCT Blood Glucose 126  Routine Hem:  05-Apr-13 02:57    Hemoglobin (CBC) 12.2    12:50    Hemoglobin (CBC) 12.3  Routine Chem:  05-Apr-13 12:50    Lipase 164  Routine Hem:  05-Apr-13 21:37    Hemoglobin (CBC) 12.1   Assessment/Plan:  Assessment/Plan:   Assessment 1) melena, in the setting of h/o roux-en-y gastric bypass.  Recent egd and colonoscopy by Dr Dionne Milo, with possible anastomotic ulcer.    Plan 1) EGD when clinically feasible, once pneumonia more adequately treated, continue ppi.   Electronic Signatures: Loistine Simas (MD)  (Signed 06-Apr-13 00:04)  Authored: Chief Complaint, VITAL SIGNS/ANCILLARY NOTES, Brief Assessment, Lab Results, Assessment/Plan   Last Updated: 06-Apr-13 00:04 by Loistine Simas (MD)

## 2014-12-01 NOTE — Consult Note (Signed)
Brief Consult Note: Diagnosis: GI bleed.   Patient was seen by consultant.   Consult note dictated.   Comments: Appreciate consult for 64 y/o caucasian male with history of [place] y bypass for possible acute GI bleed/black tarry stools and left sided abdominal pain. Reports having EGD in the last couple of months that demonstrated and ulceration at his anastomosis; was put on Carafate at that time. States that he had 2 large and 1 small black loose stool yesterday: was seen by Dr Dionne Milo and found to be heme positive. States he has not been on PPI until yesterday when he was started on Aciphex.  Was also seen by PA Jerrye Beavers and found to have some pneumonia: is currently on antibiotics and feeling better in that regard. Also relates onset of left sided abdominal pain: more laterally that sometimes radiates downward. Started yesterday, increased with eating, and did have some mild nausea last night, relieved with Zofran. Does state that he also has a history of kidney stones- states he has one now that he was told is too large to pass, that is located in the left kidney. Denies further GI complaints. Colonoscopy also earlier this year by Dr Dionne Milo: states he had one benign polyp and some diverticulosis.  Impression: 1. Likely upper GI bleed from anastomotic ulceration v. other etilogy: agree with Pantoprazole gtt and serial hemoglobins. Would transfuse if necessary. Will plan for EGD when clinically feasible since he is being treated for pneumonia as well. Will order CLIQ diet                   2. Left sided abdominal pain. Will assess lipase. Consideration should be given to evaluate kidney stone to see if this is also the cause, due to his history and lateralization of abdominal pain. May need UA/spiral ct with kidney stone protocol if further concern  Electronic Signatures: Stephens November H (NP)  (Signed 05-Apr-13 19:35)  Authored: Brief Consult Note   Last Updated: 05-Apr-13 19:35 by Theodore Demark (NP)

## 2014-12-01 NOTE — Consult Note (Signed)
PATIENT NAME:  Justin Patel, Justin Patel MR#:  [id number] DATE OF BIRTH:  1951-04-21  DATE OF CONSULTATION:  11/12/2011  REFERRING PHYSICIAN:   CONSULTING PHYSICIAN:  Theodore Demark, NP  PRIMARY CARE PHYSICIAN: Lamonte Sakai, MD  HISTORY OF PRESENT ILLNESS: Mr. Liew is a 64 year old Caucasian male who was admitted with black tarry stool. Please see admission history and physical for full details. Gastroenterology has been consulted at the request of Dr. Bjorn Loser to evaluate for acute GI bleed. Mr. Kintz has a history of Roux-en-Y bypass three years ago. He reports having EGD in the last couple of months that demonstrated an ulceration at anastomosis and was put on Carafate at that time. This EGD was done by Dr. Dionne Milo. He does report that he had been feeling well until yesterday, but did have two large and one small black loose stools. He was seen by Dr. Dionne Milo yesterday and found to be heme positive. The patient reports he has not been on a PPI until yesterday when he was started on AcipHex. Additionally, he was seen by PA Jerrye Beavers and was found to have some pneumonia. He had been experiencing some congestion and cough. He is currently on antibiotics and feeling better, in that regard. Also relates a history of left-sided abdominal pain more laterally and sometimes radiates downward. It started yesterday and increases with eating. He did have some mild nausea last night relieved with Zofran. Reports that he also has a history of kidney stones and states he has one right now and was told it is too large to pass, but it is located in the left kidney. He denies further GI complaints. He states he also had colonoscopy earlier this year by Dr. Dionne Milo, which revealed one benign polyp and some diverticulosis.   PAST MEDICAL HISTORY:  1. Depression. 2. Diabetes. 3. Hypertension. 4. Previous GI bleed due to bleeding around the surgical site. 5. History of transient global amnesia.  6. Chronic back  pain. 7. Gastroesophageal reflux disease.  8. Diverticulosis. 9. Hypertension. 10. Hyperlipidemia. 11. Status post soft palate partial removal and uvulectomy. 12. Degenerative joint disease. 13. Bone spurs. 14. Diabetic neuropathy.  15. Left leg osteotomy. 16. Hemorrhoids. 17. Kidney stones. 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   ALLERGIES: Iodine, Altace, Levaquin.  CURRENT MEDICATIONS:  1. Flonase two sprays each naris daily.  2. Cymbalta 60 mg p.o. daily.  3. Lasix 20 mg p.o. daily.  4. Metoprolol 50 mg p.o. daily.  5. Benzonatate 10 mg one tab p.o. three times daily. 6. Losartan 50 mg p.o. daily.  7. Crestor 10 mg p.o. daily. 8. AcipHex 20 mg p.o. daily.   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 Rare EtOH. No tobacco, or illicits.   FAMILY HISTORY: Significant for hypertension and diabetes. States his mother had peptic ulcer disease. Sister with ovarian cancer. Negative for colorectal cancer or liver disease.  REVIEW OF SYSTEMS: CONSTITUTIONAL: Has had some recent fevers. Positive for fatigue. States his weight is steady. HEENT: No recent complaints of headaches, blurry or double vision, redness, or drainage. Does have recent complaint of mild congestion and cough. No trouble swallowing or nose bleeds. RESPIRATORY: Has had some mild increase in shortness of breath, wheezing, and cough. States this is better at present. No decreased activities of daily living or dyspnea at rest. CARDIOVASCULAR: No chest pain, orthopnea, edema, palpitations, or murmur. GI: As noted. GU: No dysuria, hematuria, or increasing frequency. States that he does have a kidney  stone to the left kidney, as noted above. ENDOCRINE: History positive for diabetes. No history of thyroid problems. No polyuria, polydipsia, or polyphagia at present. HEMATOLOGIC/LYMPH: Does follow with Dr. Inez Pilgrim for history of iron deficiency anemia. Receives iron infusions on a regular  basis. Attributes this to malabsorption from gastric bypass. SKIN: No erythema, lesion, or rash. MUSCULOSKELETAL: History pertinent for degenerative joint disease, chronic back pain. No unusual arthralgia, myalgia. NEUROLOGIC: History positive for numbness in feet secondary to neuropathy. Takes Cymbalta for this. No history of CVA, transient ischemic attack, seizures, or fainting. PSYCH: States he is doing well with his depression. No history of anxiety or insomnia.  LABS/STUDIES: (Most recent lab work) Glucose 194, BUN 37, creatinine 1.18, serum sodium 141, potassium 4.6, chloride 107. GFR greater than 60. Calcium 8.9, total serum protein 7.1, albumin 3.6, total bilirubin 0.3, ALT 55, AST 19, and ALT 37. WBC 10.5, hemoglobin 12.2, hematocrit 37.9, platelet count 158, and red cells are normocytic but have increased RDW. PT 13 and INR 0.9.   PHYSICAL EXAM:   VITAL SIGNS: Most recent vital signs: Temperature 97.8, pulse 65, respiratory rate 18, blood pressure 143/85, and oxygen saturation 94% on room air. Last fingerstick 195.  GENERAL: Well appearing Caucasian male in no acute distress.   HEENT: Normocephalic, atraumatic. Eyes and nares without redness, drainage, or inflammation. Oral mucous membranes are pink and moist.   NECK: No thyromegaly or JVD. Slight anterior adenopathy.   CARDIOVASCULAR: Regular rate and rhythm. S1 and S2. No MRG. Peripheral pulses 2+ bilaterally. No appreciable edema.   RESPIRATORY: Respirations eupneic. Lungs with faint inspiratory and expiratory wheezing to the bilateral bases. No retractions or increased work of breathing.   ABDOMEN: Nondistended. Bowel sounds x4. Soft. Minimal tenderness to the left upper quadrant. Otherwise very nontender. No guarding, rebound, peritoneal signs, hepatosplenomegaly, or hernias.   RECTAL: Deferred.   GENITOURINARY: Deferred.   EXTREMITIES: MAEW x4. Sensation appears intact. No clubbing, cyanosis, or edema.   SKIN: Without  erythema, lesion or rash. It is warm, dry, and pink.   NEUROLOGICAL: Alert and oriented x3. Cranial nerves II through XII grossly intact. Speech clear. No facial droop.   PSYCHIATRIC: Mood stable. Very pleasant. Logical thought. Good memory.   IMPRESSION AND PLAN:  1. Likely upper GI bleed from anastomotic ulceration versus other etiology: Agree with pantoprazole drip and serial hemoglobin. Would transfuse if necessary. We will plan for EGD when clinically feasible since he has been treated for pneumonia as well. We will order a clear liquid diet without carbonation and nothing red.  2. Left-sided abdominal pain: We will assess lipase. Consideration should be given to evaluate kidney stone to see if this is also the cause due to his renal calculus history and lateralization of abdominal pain. Maybe need to consider urinalysis and spinal CT with kidney stone protocol if further concern.   These services were provided by Stephens November, MSN, NP-C in collaboration with Loistine Simas, MD.  ____________________________ Theodore Demark, NP chl:slb D: 11/13/2011 08:26:00 ET T: 11/13/2011 09:03:05 ET JOB#: [id number]  cc: Theodore Demark, NP, <Dictator> [place] SIGNED 11/13/2011 10:37

## 2014-12-01 NOTE — Consult Note (Signed)
Chief Complaint:   Subjective/Chief Complaint doing well, breathing better, luq pain is 6/10.  no n/v, no stool/black stool   VITAL SIGNS/ANCILLARY NOTES: **Vital Signs.:   06-Apr-13 14:45   Vital Signs Type Routine   Temperature Temperature (F) 96.9   Celsius 36   Temperature Source oral   Pulse Pulse 68   Pulse source per Dinamap   Respirations Respirations 18   Systolic BP Systolic BP 295   Diastolic BP (mmHg) Diastolic BP (mmHg) 86   Mean BP 101   Pulse Ox % Pulse Ox % 91   Pulse Ox Activity Level  At rest   Oxygen Delivery Room Air/ 21 %   Brief Assessment:   Cardiac Regular    Respiratory clear BS    Gastrointestinal details normal Soft  Nondistended  No masses palpable  Bowel sounds normal  No rebound tenderness  tender to palpation luq, extending laterally.   Routine Hem:  04-Apr-13 18:59    Hemoglobin (CBC) 12.9  05-Apr-13 02:57    Hemoglobin (CBC) 12.2    12:50    Hemoglobin (CBC) 12.3    21:37    Hemoglobin (CBC) 12.1  06-Apr-13 05:12    Hemoglobin (CBC) 11.3  Blood Glucose:  06-Apr-13 07:21    POCT Blood Glucose 122  Routine Hem:  06-Apr-13 09:29    WBC (CBC) 15.9   RBC (CBC) 4.17   Hemoglobin (CBC) 12.4   Hematocrit (CBC) 39.5   Platelet Count (CBC) 251   MCV 95   MCH 29.8   MCHC 31.5   RDW 16.3  Routine Chem:  06-Apr-13 09:29    Glucose, Serum 110   BUN 27   Creatinine (comp) 1.23   Sodium, Serum 143   Potassium, Serum 3.6   Chloride, Serum 105   CO2, Serum 29   Calcium (Total), Serum 9.3   Osmolality (calc) 291   eGFR (African American) >60   eGFR (Non-African American) >60   Anion Gap 9  Routine Hem:  06-Apr-13 09:29    Neutrophil % 68.1   Lymphocyte % 24.5   Monocyte % 7.0   Eosinophil % 0.1   Basophil % 0.3   Neutrophil # 10.9   Lymphocyte # 3.9   Monocyte # 1.1   Eosinophil # 0.0   Basophil # 0.0  Blood Glucose:  06-Apr-13 11:20    POCT Blood Glucose 124   Assessment/Plan:  Assessment/Plan:   Assessment 1)  melena-stable not recurrent.  h/o r-n-y gasatric bypass with recent anastomotic ulcer 2) pneumonia    Plan 1) continue treating pneumonia, will check films tomorrow, EGD monday as clinically feasible. continue ppi, will change to iv bid.  I have discussed the risks benefits and complications of egd to include not limited to bleeding infection perforation and sedation and he wishes to proceed.   Electronic Signatures: Loistine Simas (MD)  (Signed 06-Apr-13 15:25)  Authored: Chief Complaint, VITAL SIGNS/ANCILLARY NOTES, Brief Assessment, Lab Results, Assessment/Plan   Last Updated: 06-Apr-13 15:25 by Loistine Simas (MD)

## 2014-12-01 NOTE — H&P (Signed)
PATIENT NAME:  Justin Patel, CRIPPS MR#:  [id number] DATE OF BIRTH:  1951/08/06  DATE OF ADMISSION:  11/11/2011  PRIMARY MD: Dr. Lamonte Sakai   ED REFERRING DOCTOR: Dr. Jasmine December    CHIEF COMPLAINT: Dark colored stools.   HISTORY OF PRESENT ILLNESS: The patient is a 64 year old white male with previous history of gastric bypass with history of bleeding ulceration around the anastomosis site who reports that he recently had dark colored stools earlier this year and had endoscopy in February. Since then he has not had any problems with any further bleeding. This morning he started to have dark colored stools. He reports that he had two large bowel movements with these. He was seen by the PA in Dr. Perrin Maltese office and subsequently was sent to Dr. Dionne Milo who referred him here for admission. The patient otherwise reports that he has been having some pain since this afternoon in the mid left upper quadrant and the left lower quadrant which he describes as a sharp type of pain. He denies any nausea, vomiting, or emesis. Denies any hematemesis. Otherwise, denies any chest pain, shortness of breath. Denies any fevers or chills.   PAST MEDICAL HISTORY:  1. Diabetes.  2. Depression.  3. Hypertension.  4. History of previous history of GI bleed due to bleeding around the surgical site.  5. History of transient global amnesia.  6. Gastroesophageal reflux disease.  7. History of severe diverticulosis.  8. Hypertension.  9. Chronic back pain.  10. Hyperlipidemia.  11. Status post pharyngeal surgery/uvulectomy.   12. History of and degenerative joint disease. 13. Bone spurs. 14. Diabetic neuropathy. 15. Left leg osteotomy.  16. History of hemorrhoids.  17. Previous history of kidney stones.   ALLERGIES: Iodine, Altace, and Levaquin.   CURRENT MEDICATIONS:  1. Flonase to each nostril daily.  2. Cymbalta 60 daily.  3. Lasix 20 daily.  4. Metoprolol 50 daily.  5. Benzonatate 10 one tab p.o. t.i.d.   6. Losartan 50 p.o. daily. 7. Crestor 10 daily.  8. AcipHex 20 daily.   SOCIAL HISTORY: Does not smoke. Does not drink. No drugs.   FAMILY HISTORY: Positive for hypertension and diabetes.   REVIEW OF SYSTEMS: CONSTITUTIONAL: Denies any fevers, fatigue, weakness, pain, weight loss or weight gain. EYES: No blurred or double vision. No pain. No redness. No inflammation. No glaucoma. No cataracts. ENT: No tinnitus. No ear pain. No hearing loss. He does have seasonal allergies. No epistaxis. No nasal discharge. No snoring. No postnasal drip. No difficulty swallowing. RESPIRATORY: Complains of having wheezing recently and some cough. No hemoptysis. No COPD. No TB. CARDIOVASCULAR: No chest pain. No orthopnea. No edema. No arrhythmia. GI: No nausea, vomiting, diarrhea. Complains of left-sided abdominal pain. No hematemesis. Complains of melena. Has history of gastroesophageal reflux disease, history of gastric ulcers. No irritable bowel syndrome. No jaundice. GU: Denies any dysuria, hematuria, renal calculus, or frequency. ENDOCRINE: Denies any polyuria, nocturia, or thyroid problems. HEME/LYMPH: Has chronic anemia, iron deficiency. Has been followed by Hematology. SKIN: No acne. No rash. No change in mole or hair. MUSCULOSKELETAL: Has pain related to osteoarthritis. No gout. NEUROLOGIC: No numbness. No CVA. No TIA. No seizures. PSYCHIATRIC: No anxiety. No insomnia. No ADD.   PHYSICAL EXAMINATION:   VITAL SIGNS: Temperature 98.3, pulse 90, respirations 16, blood pressure 156/80, O2 99%.   GENERAL: The patient is a Caucasian male in no acute distress.   HEENT: Head atraumatic, normocephalic. Pupils equally round and reactive to light and accommodation. Extraocular  movements intact. Oropharynx is clear without any exudates.   NECK: There is no thyromegaly. No carotid bruits.   CARDIOVASCULAR: Regular rate and rhythm. No murmurs, rubs, clicks, or gallops. PMI is not displaced.   LUNGS: Clear to  auscultation bilaterally without any rales. He does have right-sided wheezing and some rhonchi.   ABDOMEN: Soft, nondistended. Positive bowel sounds x4. There is no hepatosplenomegaly. He does have some tenderness in the left lower quadrant and left upper abdomen without any guarding or rebound.   EXTREMITIES: Trace edema.   SKIN: No rash.   LYMPHATICS: No lymph nodes palpable.   VASCULAR: Good DP, PT pulses.   NEUROLOGICAL: Awake, alert, oriented x3. No focal deficits.   PSYCHIATRIC: Not anxious or depressed.   LABORATORY, DIAGNOSTIC, AND RADIOLOGICAL DATA: WBC 9.2, hemoglobin 12.9, platelet count 169. The rest of his labs are pending.   ASSESSMENT AND PLAN: The patient is a 64 year old white male with history of gastric bypass with history of previous GI bleed with history of gastric ulcer with ulcerations at the site of the anastomosis who is presenting with dark-colored stools. He has been referred by Dr. Dionne Milo for ED admission.  1. Likely upper GI bleed possibly due to gastritis/esophagitis, possible gastric ulcer. At this time will place him on Protonix drip. His hemoglobin is currently stable. GI consult in the a.m. He will need EGD again.  2. Cough, wheezing, likely has bronchitis with asthma exacerbation. Will place him on nebulizers, IV antibiotics, and p.o. steroid taper. The patient's symptoms have been going on since last week.  3. Diabetes, type II. Will place him on sliding scale insulin. Decrease dose of Levemir.  4. Gastroesophageal reflux disease. He will be on a Protonix drip.  5. Hypertension. Will continue metoprolol and losartan. Hold Lasix.   TIME SPENT: 35 minutes.   ____________________________ Lafonda Mosses Posey Pronto, MD shp:drc D: 11/11/2011 20:31:19 ET T: 11/12/2011 06:24:53 ET JOB#: [id number]  cc: Lillee Mooneyhan H. Posey Pronto, MD, <Dictator> Perrin Maltese, MD Alric Seton MD ELECTRONICALLY SIGNED 11/12/2011 12:20

## 2014-12-04 ENCOUNTER — Ambulatory Visit (INDEPENDENT_AMBULATORY_CARE_PROVIDER_SITE_OTHER): Payer: BC Managed Care – PPO | Admitting: Neurology

## 2014-12-04 ENCOUNTER — Encounter: Payer: Self-pay | Admitting: Neurology

## 2014-12-04 VITALS — BP 136/82 | HR 76 | Resp 16 | Ht 68.0 in | Wt 190.5 lb

## 2014-12-04 DIAGNOSIS — G40909 Epilepsy, unspecified, not intractable, without status epilepticus: Secondary | ICD-10-CM | POA: Diagnosis not present

## 2014-12-04 DIAGNOSIS — Z8679 Personal history of other diseases of the circulatory system: Secondary | ICD-10-CM | POA: Diagnosis not present

## 2014-12-04 NOTE — Progress Notes (Signed)
NEUROLOGY FOLLOW UP OFFICE NOTE  Justin Patel [id number]  HISTORY OF PRESENT ILLNESS: Justin Patel is a 64 year old right-handed man with type II diabetes, hypertension, status post gastric bypass, CAD status post catheterization and history of left TKR and GI bleed and post-traumatic subdural hematoma who presents for recurrent episodes of transient altered awareness.  24 hour EEG and chart reviewed.  UPDATE: Due to possibility that these spells are symptomatic complex partial seizures, he was started on Keppra 500mg  twice daily.  He had a 24 hour ambulatory EEG, which was normal but also did not capture one of his habitual spells.  Otherwise, he has been doing well.  He has had no recurrent spells.  He feels a little fatigued but is tolerating the Keppra.  HISTORY: He was on lovenox following left total knee replacement on 05/14/14.  He passed out and fell on 06/03/14, hitting the back of his head.  He was waiting for the repairman.  The last thing he remembers was getting a call from the repairman saying he will be at the house in 10 minutes.  Then next thing he remembers was waking up in [facility].  Reportedly, when he answered the door, the repairman said he looked strange and his body became rigid and just fell backwards.  He was convulsing on the ground.  CT of the head showed a small subdural hematoma along the right interhemispheric fissure.  MRI of the brain showed post-traumatic shearing injury presenting as medial right frontal acute parenchymal hemorrhage with adjacent subdural and subarachnoid blood.  It also revealed associated ischemia in the right frontal subcortical white matter, due to injury.  2D echo LVEF 55-60%.  EEG was normal.  Anticoagulation was held.  CT of the head performed on 06/05/14 showed interval improvement in the bleed.  Since 2010, he had episodes of bleed following gastric bypass surgery in which he had episodes of syncope.  One time, EMS arrived after he  had passed out and fell in the bathtub.  He had voided his bowls.  EMS told him he had a seizure.  He also reports an episode of transient global amnesia in 2012.  PAST MEDICAL HISTORY: Past Medical History  Diagnosis Date  . Hypertension   . Anxiety   . Sleep apnea     hx not now since wt loss  . Diabetes mellitus   . Stones in the urinary tract   . GERD (gastroesophageal reflux disease)   . H/O hiatal hernia   . Headache(784.0)   . Arthritis   . 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 Gastric ulcer   . Degenerative disc disease   . Sacral fracture, closed   . Syncope and collapse   . Chronic kidney disease   . Heart murmur   . Hyperlipidemia   . Hx of congestive heart failure     MEDICATIONS: Current Outpatient Prescriptions on File Prior to Visit  Medication Sig Dispense Refill  . acetaminophen (TYLENOL) 500 MG tablet Take 1,000 mg by mouth every 6 (six) hours as needed.    Marland Kitchen amoxicillin-clavulanate (AUGMENTIN) 875-125 MG per tablet Take 1 tablet by mouth 2 (two) times daily. 14 tablet 0  . CRESTOR 10 MG tablet take 1 tablet by mouth once daily 30 tablet 5  . DULoxetine (CYMBALTA) 60 MG capsule take 1 capsule by mouth once daily 30 capsule 3  . levETIRAcetam (KEPPRA) 500 MG tablet Take 1 tablet (500 mg  total) by mouth 2 (two) times daily. 60 tablet 5  . losartan (COZAAR) 100 MG tablet take 1 tablet by mouth once daily 30 tablet 5  . metoprolol succinate (TOPROL-XL) 50 MG 24 hr tablet Take 1 tablet (50 mg total) by mouth daily. 30 tablet 6  . NEXIUM 40 MG capsule take 1 capsule by mouth once daily 30 capsule 10  . sucralfate (CARAFATE) 1 G tablet Take 1 tablet (1 g total) by mouth 4 (four) times daily. 120 tablet 11  . tamsulosin (FLOMAX) 0.4 MG CAPS capsule Take 0.4 mg by mouth daily.    . traMADol (ULTRAM) 50 MG tablet take 2 tablets by mouth every 8 hours if needed for SEVERE PAIN 180 tablet 2  . VITAMIN D, CHOLECALCIFEROL, PO Take 1,000 mg by mouth.      No  current facility-administered medications on file prior to visit.    ALLERGIES: Allergies  Allergen Reactions  . Altace [Ramipril] Anaphylaxis  . Levaquin [Levofloxacin In D5w] Hives  . Iohexol Hives     Desc: HIVES     FAMILY HISTORY: Family History  Problem Relation Age of Onset  . Dementia Mother 83  . Heart disease Father 69  . Diabetes Father   . Cancer Sister     lymphoma, stage 4  . Cancer Sister 29    Ovarian  . Lupus Sister     SOCIAL HISTORY: History   Social History  . Marital Status: Married    Spouse Name: N/A  . Number of Children: N/A  . Years of Education: N/A   Occupational History  . Not on file.   Social History Main Topics  . Smoking status: Never Smoker   . Smokeless tobacco: Never Used  . Alcohol Use: 0.6 oz/week    1 Glasses of wine per week     Comment: occasional  . Drug Use: No  . Sexual Activity:    Partners: Female   Other Topics Concern  . Not on file   Social History Narrative   Lives in [place] with wife, [place]. 2 sons, William Hamburger, Lennette Bihari 28.. 4 grandchildren      Work - Retired, previously taught music in [place] - regular diet, limited quantities after gastric bypass      Exercise - no regular, limited by arthritis in knees, occasional water aerobics    REVIEW OF SYSTEMS: Constitutional: No fevers, chills, or sweats, no generalized fatigue, change in appetite Eyes: No visual changes, double vision, eye pain Ear, nose and throat: No hearing loss, ear pain, nasal congestion, sore throat Cardiovascular: No chest pain, palpitations Respiratory:  No shortness of breath at rest or with exertion, wheezes GastrointestinaI: No nausea, vomiting, diarrhea, abdominal pain, fecal incontinence Genitourinary:  No dysuria, urinary retention or frequency Musculoskeletal:  No neck pain, back pain Integumentary: No rash, pruritus, skin lesions Neurological: as above Psychiatric: No depression, insomnia,  anxiety Endocrine: No palpitations, fatigue, diaphoresis, mood swings, change in appetite, change in weight, increased thirst Hematologic/Lymphatic:  No anemia, purpura, petechiae. Allergic/Immunologic: no itchy/runny eyes, nasal congestion, recent allergic reactions, rashes  PHYSICAL EXAM: Filed Vitals:   12/04/14 1127  BP: 136/82  Pulse: 76  Resp: 16   General: No acute distress Head:  Normocephalic/atraumatic Eyes:  Fundoscopic exam unremarkable without vessel changes, exudates, hemorrhages or papilledema. Neck: supple, no paraspinal tenderness, full range of motion Heart:  Regular rate and rhythm Lungs:  Clear to auscultation bilaterally Back: No paraspinal  tenderness Neurological Exam: alert and oriented to person, place, and time. Attention span and concentration intact, recent and remote memory intact, fund of knowledge intact.  Speech fluent and not dysarthric, language intact.  CN II-XII intact. Fundoscopic exam unremarkable without vessel changes, exudates, hemorrhages or papilledema.  Bulk and tone normal, muscle strength 5/5 throughout.  Sensation to light touch, temperature and vibration intact.  Deep tendon reflexes 2+ throughout, toes downgoing.  Finger to nose and heel to shin testing intact.  Gait normal.  IMPRESSION: Possible seizure disorder, as he has had two episodes of witnessed seizure, at least one of them unprovoked.  I don't think he had convulsive syncope.  With the spell from 2013, he had apparently been convulsing for an extended period of time since the first event was witnessed by EMS.  In regards to the second event in October, he appeared to be postictal since he does not remember anything until he was already in the ER. History of subdural hematoma  PLAN: 1.  I would continue Keppra 500mg  twice daily 2.  Has had no further spells since 06/03/14.  Since it has been over 6 months, he may resume driving. 3.  Follow up in 6 months.  30 minutes spent with  patient, over 50% spent counseling and coordinating care.  Metta Clines, DO  CC: Ronette Deter, MD

## 2014-12-04 NOTE — Patient Instructions (Signed)
Since it appears that you may have had 2 unprovoked seizures, I would remain on the Keppra 500mg  twice daily for now. You may now resume driving as it has been over 6 months since the last spell Follow up in 6 months.

## 2014-12-05 ENCOUNTER — Other Ambulatory Visit: Payer: Self-pay | Admitting: Internal Medicine

## 2014-12-05 NOTE — Telephone Encounter (Signed)
Last refill 1.29.16, last OV 4.8.16.  Please advise refill

## 2014-12-05 NOTE — Telephone Encounter (Signed)
rx faxed

## 2014-12-06 LAB — CANCER CENTER HEMOGLOBIN: HGB: 12.1 g/dL — AB (ref 13.0–18.0)

## 2014-12-06 LAB — IRON AND TIBC
IRON BIND. CAP.(TOTAL): 302 (ref 250–450)
Iron Saturation: 34.5
Iron: 104 ug/dL
UNBOUND IRON-BIND. CAP.: 197.7

## 2014-12-06 LAB — FERRITIN: Ferritin (ARMC): 120 ng/mL

## 2014-12-09 ENCOUNTER — Encounter: Payer: Self-pay | Admitting: Internal Medicine

## 2014-12-10 ENCOUNTER — Encounter: Payer: Self-pay | Admitting: Internal Medicine

## 2014-12-16 ENCOUNTER — Telehealth: Payer: Self-pay | Admitting: Internal Medicine

## 2014-12-16 ENCOUNTER — Ambulatory Visit: Payer: BC Managed Care – PPO | Admitting: Internal Medicine

## 2014-12-16 NOTE — Telephone Encounter (Signed)
Fine to cancel today's appt

## 2014-12-16 NOTE — Telephone Encounter (Signed)
Please see below.

## 2014-12-16 NOTE — Telephone Encounter (Signed)
Pt called to state that he would not be able to make appt today. Pt has an appt scheduled on 5/24. Please advise to cancel appt for today/msn

## 2014-12-23 ENCOUNTER — Other Ambulatory Visit: Payer: Self-pay | Admitting: Internal Medicine

## 2014-12-31 ENCOUNTER — Encounter: Payer: Self-pay | Admitting: Internal Medicine

## 2014-12-31 ENCOUNTER — Ambulatory Visit (INDEPENDENT_AMBULATORY_CARE_PROVIDER_SITE_OTHER): Payer: BC Managed Care – PPO | Admitting: Internal Medicine

## 2014-12-31 VITALS — BP 101/72 | HR 86 | Temp 98.3°F | Ht 68.0 in | Wt 189.1 lb

## 2014-12-31 DIAGNOSIS — H66002 Acute suppurative otitis media without spontaneous rupture of ear drum, left ear: Secondary | ICD-10-CM | POA: Diagnosis not present

## 2014-12-31 DIAGNOSIS — D6489 Other specified anemias: Secondary | ICD-10-CM

## 2014-12-31 MED ORDER — CLINDAMYCIN HCL 300 MG PO CAPS: 300.0000 mg | ORAL_CAPSULE | Freq: Three times a day (TID) | ORAL | Status: DC

## 2014-12-31 NOTE — Patient Instructions (Signed)
Follow up with Dr. Richardson Landry at [facility]NT at 2:30pm tomorrow.

## 2014-12-31 NOTE — Assessment & Plan Note (Signed)
Reviewed recent labs from [facility]. Follow up CBC scheduled with cancer center Friday. No active bleeding noted. Will follow.

## 2014-12-31 NOTE — Assessment & Plan Note (Signed)
Left OM with TM tube in place. No improvement with 14 days of Augment.  I am concerned about progressive infection with mastoiditis, given mastoid tenderness. Reviewed ear culture results from ENT which showed staph. Will start Clindamycin 300mg  po tid. Will set up ENT follow up for tomorrow.

## 2014-12-31 NOTE — Progress Notes (Signed)
Pre visit review using our clinic review tool, if applicable. No additional management support is needed unless otherwise documented below in the visit note. 

## 2014-12-31 NOTE — Progress Notes (Signed)
Subjective:    Patient ID: Justin Patel, male    DOB: Dec 10, 1950, 65 y.o.   MRN: [id number]  HPI  64YO male presents for follow up.  Left ear infection - Seen by ENT early this month, treated with Augmentin for 2 weeks. Continues to have purulent drainage from left ear. Fever at home 102F over the weekend. Also having sinus pressure, pain.  Anemia - Followed at [facility]. Labs this Friday pending. Continues to feel fatigued. No recent GI bleeding.  Past medical, surgical, family and social history per today's encounter.  Review of Systems  Constitutional: Negative for fever, chills, activity change, appetite change, fatigue and unexpected weight change.  HENT: Positive for congestion, ear discharge, ear pain, hearing loss and sinus pressure. Negative for postnasal drip, rhinorrhea, sore throat, tinnitus and trouble swallowing.   Eyes: Negative for visual disturbance.  Respiratory: Negative for cough and shortness of breath.   Cardiovascular: Negative for chest pain, palpitations and leg swelling.  Gastrointestinal: Negative for abdominal pain and abdominal distention.  Genitourinary: Negative for dysuria, urgency and difficulty urinating.  Musculoskeletal: Negative for arthralgias and gait problem.  Skin: Negative for color change and rash.  Hematological: Negative for adenopathy.  Psychiatric/Behavioral: Negative for sleep disturbance and dysphoric mood. The patient is not nervous/anxious.        Objective:    BP 101/72 mmHg  Pulse 86  Temp(Src) 98.3 F (36.8 C) (Oral)  Ht 5\' 8"  (1.727 m)  Wt 189 lb 2 oz (85.787 kg)  BMI 28.76 kg/m2  SpO2 97% Physical Exam  Constitutional: He is oriented to person, place, and time. He appears well-developed and well-nourished. No distress.  HENT:  Head: Normocephalic and atraumatic.  Right Ear: Tympanic membrane, external ear and ear canal normal.  Left Ear: There is drainage. There is mastoid tenderness. Tympanic membrane  is erythematous and bulging. A middle ear effusion is present.  Nose: Nose normal.  Mouth/Throat: Oropharynx is clear and moist. No oropharyngeal exudate.  Left TM tube in place  Eyes: Conjunctivae and EOM are normal. Pupils are equal, round, and reactive to light. Right eye exhibits no discharge. Left eye exhibits no discharge. No scleral icterus.  Neck: Normal range of motion. Neck supple. No tracheal deviation present. No thyromegaly present.  Cardiovascular: Normal rate, regular rhythm and normal heart sounds.  Exam reveals no gallop and no friction rub.   No murmur heard. Pulmonary/Chest: Effort normal and breath sounds normal. No accessory muscle usage. No tachypnea. No respiratory distress. He has no decreased breath sounds. He has no wheezes. He has no rhonchi. He has no rales. He exhibits no tenderness.  Musculoskeletal: Normal range of motion. He exhibits no edema.  Lymphadenopathy:    He has no cervical adenopathy.  Neurological: He is alert and oriented to person, place, and time. No cranial nerve deficit. Coordination normal.  Skin: Skin is warm and dry. No rash noted. He is not diaphoretic. No erythema. No pallor.  Psychiatric: He has a normal mood and affect. His behavior is normal. Judgment and thought content normal.          Assessment & Plan:   Problem List Items Addressed This Visit      Unprioritized   Anemia    Reviewed recent labs from [facility]. Follow up CBC scheduled with cancer center Friday. No active bleeding noted. Will follow.      Otitis media - Primary    Left OM with TM tube in place. No improvement with  14 days of Augment.  I am concerned about progressive infection with mastoiditis, given mastoid tenderness. Reviewed ear culture results from ENT which showed staph. Will start Clindamycin 300mg  po tid. Will set up ENT follow up for tomorrow.      Relevant Medications   clindamycin (CLEOCIN) 300 MG capsule   Other Relevant Orders   Ambulatory referral  to ENT       Return in about 4 weeks (around 01/28/2015) for Recheck.

## 2015-01-03 ENCOUNTER — Encounter (INDEPENDENT_AMBULATORY_CARE_PROVIDER_SITE_OTHER): Payer: Self-pay

## 2015-01-03 ENCOUNTER — Inpatient Hospital Stay: Payer: BC Managed Care – PPO | Attending: Family Medicine

## 2015-01-03 ENCOUNTER — Inpatient Hospital Stay: Payer: BC Managed Care – PPO

## 2015-01-03 ENCOUNTER — Encounter: Payer: Self-pay | Admitting: Internal Medicine

## 2015-01-03 ENCOUNTER — Inpatient Hospital Stay (HOSPITAL_BASED_OUTPATIENT_CLINIC_OR_DEPARTMENT_OTHER): Payer: BC Managed Care – PPO | Admitting: Family Medicine

## 2015-01-03 VITALS — BP 114/77 | HR 65 | Temp 97.7°F | Resp 16 | Wt 191.6 lb

## 2015-01-03 DIAGNOSIS — D509 Iron deficiency anemia, unspecified: Secondary | ICD-10-CM

## 2015-01-03 DIAGNOSIS — Z8719 Personal history of other diseases of the digestive system: Secondary | ICD-10-CM | POA: Insufficient documentation

## 2015-01-03 DIAGNOSIS — R531 Weakness: Secondary | ICD-10-CM | POA: Insufficient documentation

## 2015-01-03 DIAGNOSIS — Z9884 Bariatric surgery status: Secondary | ICD-10-CM | POA: Insufficient documentation

## 2015-01-03 DIAGNOSIS — R5381 Other malaise: Secondary | ICD-10-CM | POA: Insufficient documentation

## 2015-01-03 DIAGNOSIS — E538 Deficiency of other specified B group vitamins: Secondary | ICD-10-CM | POA: Diagnosis not present

## 2015-01-03 DIAGNOSIS — E785 Hyperlipidemia, unspecified: Secondary | ICD-10-CM

## 2015-01-03 DIAGNOSIS — R5383 Other fatigue: Secondary | ICD-10-CM

## 2015-01-03 DIAGNOSIS — E119 Type 2 diabetes mellitus without complications: Secondary | ICD-10-CM | POA: Insufficient documentation

## 2015-01-03 DIAGNOSIS — Z79899 Other long term (current) drug therapy: Secondary | ICD-10-CM | POA: Diagnosis not present

## 2015-01-03 DIAGNOSIS — M1990 Unspecified osteoarthritis, unspecified site: Secondary | ICD-10-CM | POA: Insufficient documentation

## 2015-01-03 DIAGNOSIS — I10 Essential (primary) hypertension: Secondary | ICD-10-CM

## 2015-01-03 DIAGNOSIS — D6489 Other specified anemias: Secondary | ICD-10-CM

## 2015-01-03 DIAGNOSIS — K219 Gastro-esophageal reflux disease without esophagitis: Secondary | ICD-10-CM | POA: Insufficient documentation

## 2015-01-03 LAB — VITAMIN B12: Vitamin B-12: 211 pg/mL (ref 180–914)

## 2015-01-03 LAB — HEMOGLOBIN: HEMOGLOBIN: 12 g/dL — AB (ref 13.0–18.0)

## 2015-01-03 LAB — FERRITIN: FERRITIN: 146 ng/mL (ref 24–336)

## 2015-01-03 MED ORDER — SODIUM CHLORIDE 0.9 % IV SOLN
Freq: Once | INTRAVENOUS | Status: AC
  Administered 2015-01-03: 15:00:00 via INTRAVENOUS
  Filled 2015-01-03: qty 1000

## 2015-01-03 MED ORDER — SODIUM CHLORIDE 0.9 % IJ SOLN
10.0000 mL | Freq: Once | INTRAMUSCULAR | Status: DC
Start: 2015-01-03 — End: 2015-01-03
  Filled 2015-01-03: qty 10

## 2015-01-03 MED ORDER — CYANOCOBALAMIN 1000 MCG/ML IJ SOLN
1000.0000 ug | Freq: Once | INTRAMUSCULAR | Status: AC
  Administered 2015-01-03: 1000 ug via SUBCUTANEOUS
  Filled 2015-01-03: qty 1

## 2015-01-03 MED ORDER — SODIUM CHLORIDE 0.9 % IV SOLN
510.0000 mg | Freq: Once | INTRAVENOUS | Status: AC
  Administered 2015-01-03: 510 mg via INTRAVENOUS
  Filled 2015-01-03: qty 17

## 2015-01-03 NOTE — Progress Notes (Signed)
[place]  Telephone:([phone]  Fax:(336) [phone]     Justin Patel OB: 08-Jul-1951  MR#: [id number]  HER#:[id number]  Patient Care Team: Jackolyn Confer, MD as PCP - General (Internal Medicine)  CHIEF COMPLAINT:  Chief Complaint  Patient presents with  . Follow-up    Is scheduled to receive B-12 injection and possible Feraheme today.Marland KitchenMarland KitchenIs in the process of recovering from a staph infection to his left ear...    INTERVAL HISTORY:  Patient is here for further evaluation regarding IDA. He reports recent increase in fatigue and weakness. He denies any blood loss rectally. He has been active but describes that he can noticeably tell that he is more fatigued after routine ADLs.  REVIEW OF SYSTEMS:   Review of Systems  Constitutional: Positive for malaise/fatigue.  Respiratory: Negative.   Cardiovascular: Negative.   Gastrointestinal: Negative.   Genitourinary: Negative.   Musculoskeletal: Negative.   Skin: Negative.   Neurological: Positive for weakness.  Endo/Heme/Allergies: Negative.   Psychiatric/Behavioral: Negative.   All other systems reviewed and are negative.   As per HPI. Otherwise, a complete review of systems is negatve.  ONCOLOGY HISTORY:  No history exists.    PAST MEDICAL HISTORY: Past Medical History  Diagnosis Date  . Hypertension   . Anxiety   . Sleep apnea     hx not now since wt loss  . Diabetes mellitus   . Stones in the urinary tract   . GERD (gastroesophageal reflux disease)   . H/O hiatal hernia   . Headache(784.0)   . Arthritis   . 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 Gastric ulcer   . Degenerative disc disease   . Sacral fracture, closed   . Syncope and collapse   . Chronic kidney disease   . Heart murmur   . Hyperlipidemia   . Hx of congestive heart failure     PAST SURGICAL HISTORY: Past Surgical History  Procedure Laterality Date  . Hernia repair  2000    hiatal  . Gastric bypass  2010      [place]  . Knee arthroscopy      bilateral, left x 2  . Osteotomy  2001    left  . Tonsillectomy    . Appendectomy    . Nasal sinus surgery      x5  . Uvulopalatoplasty  2011  . Anterior cervical decomp/discectomy fusion  01/26/2012    Procedure: ANTERIOR CERVICAL DECOMPRESSION/DISCECTOMY FUSION 3 LEVELS;  Surgeon: Floyce Stakes, MD;  Location: [facility];  Service: Neurosurgery;  Laterality: N/A;  Cervical three-four Cervical four-five Cervical five-six Cervical six-seven , Anterior cervical decompression/diskectomy, fusion, plate  . Total knee arthroplasty Left 05/2014    Dr. Marry Guan  . Cardiac catheterization      [facility], normal  . Cardiac catheterization      [place]    FAMILY HISTORY Family History  Problem Relation Age of Onset  . Dementia Mother 46  . Heart disease Father 43  . Diabetes Father   . Cancer Sister     lymphoma, stage 4  . Cancer Sister 52    Ovarian  . Lupus Sister     GYNECOLOGIC HISTORY:  No LMP for male patient.     ADVANCED DIRECTIVES:    HEALTH MAINTENANCE: History  Substance Use Topics  . Smoking status: Never Smoker   . Smokeless tobacco: Never Used  . Alcohol Use: 0.6 oz/week  1 Glasses of wine per week     Comment: occasional     Colonoscopy:  PAP:  Bone density:  Lipid panel:  Allergies  Allergen Reactions  . Altace [Ramipril] Anaphylaxis  . Levaquin [Levofloxacin In D5w] Hives  . Iohexol Hives     Desc: HIVES     Current Outpatient Prescriptions  Medication Sig Dispense Refill  . acetaminophen (TYLENOL) 500 MG tablet Take 1,000 mg by mouth every 6 (six) hours as needed.    . clindamycin (CLEOCIN) 300 MG capsule Take 1 capsule (300 mg total) by mouth 3 (three) times daily. 30 capsule 0  . CRESTOR 10 MG tablet take 1 tablet by mouth once daily 30 tablet 5  . DULoxetine (CYMBALTA) 60 MG capsule take 1 capsule by mouth once daily 30 capsule 3  . levETIRAcetam (KEPPRA) 500 MG tablet Take 1  tablet (500 mg total) by mouth 2 (two) times daily. 60 tablet 5  . losartan (COZAAR) 100 MG tablet take 1 tablet by mouth once daily 30 tablet 5  . metoprolol succinate (TOPROL-XL) 50 MG 24 hr tablet Take 1 tablet (50 mg total) by mouth daily. 30 tablet 6  . NEXIUM 40 MG capsule take 1 capsule by mouth once daily 30 capsule 10  . sucralfate (CARAFATE) 1 G tablet Take 1 tablet (1 g total) by mouth 4 (four) times daily. 120 tablet 11  . tamsulosin (FLOMAX) 0.4 MG CAPS capsule Take 0.4 mg by mouth daily.    . traMADol (ULTRAM) 50 MG tablet take 2 tablets by mouth every 8 hours if needed for SEVERE PAIN 180 tablet 2  . VITAMIN D, CHOLECALCIFEROL, PO Take 1,000 mg by mouth.      No current facility-administered medications for this visit.    OBJECTIVE: BP 114/77 mmHg  Pulse 65  Temp(Src) 97.7 F (36.5 C) (Oral)  Resp 16  Wt 191 lb 9.3 oz (86.9 kg)  SpO2 100%   Body mass index is 29.14 kg/(m^2).    ECOG FS:1 - Symptomatic but completely ambulatory  General: Well-developed, well-nourished, no acute distress. Eyes: Pink conjunctiva, anicteric sclera. HEENT: Normocephalic, moist mucous membranes, clear oropharnyx. Lungs: Clear to auscultation bilaterally. Heart: Regular rate and rhythm. No rubs, murmurs, or gallops. Musculoskeletal: No edema, cyanosis, or clubbing. Neuro: Alert, answering all questions appropriately. Cranial nerves grossly intact. Skin: No rashes or petechiae noted. Psych: Normal affect.   LAB RESULTS:     Component Value Date/Time   NA 141 07/19/2014 1557   NA 140 05/17/2014 0529   K 4.2 07/19/2014 1557   K 3.8 05/17/2014 0529   CL 104 07/19/2014 1557   CL 105 05/17/2014 0529   CO2 28 07/19/2014 1557   CO2 27 05/17/2014 0529   GLUCOSE 137* 07/19/2014 1557   GLUCOSE 112* 05/17/2014 0529   BUN 17 07/19/2014 1557   BUN 10 05/17/2014 0529   CREATININE 0.92 07/19/2014 1557   CREATININE 0.94 06/05/2014 0400   CREATININE 0.95 05/17/2014 0529   CALCIUM 9.2  07/19/2014 1557   CALCIUM 8.2* 05/17/2014 0529   PROT 6.0 07/19/2014 1557   PROT 4.5* 07/22/2013 0410   ALBUMIN 3.5 07/19/2014 1557   ALBUMIN 2.3* 07/22/2013 0410   AST 13 07/19/2014 1557   AST 16 07/22/2013 0410   ALT 12 07/19/2014 1557   ALT 12 07/22/2013 0410   ALKPHOS 69 07/19/2014 1557   ALKPHOS 44* 07/22/2013 0410   BILITOT 0.3 07/19/2014 1557   GFRNONAA 87* 06/05/2014 0400   GFRNONAA >60 05/01/2014  0928   GFRAA >90 06/05/2014 0400   GFRAA >60 05/01/2014 0928    No results found for: SPEP, UPEP  Lab Results  Component Value Date   WBC 7.5 07/29/2014   NEUTROABS 5.0 07/29/2014   HGB 12.0* 01/03/2015   HCT 36.9* 07/29/2014   MCV 98 07/29/2014   PLT 228 07/29/2014      Chemistry      Component Value Date/Time   NA 141 07/19/2014 1557   NA 140 05/17/2014 0529   K 4.2 07/19/2014 1557   K 3.8 05/17/2014 0529   CL 104 07/19/2014 1557   CL 105 05/17/2014 0529   CO2 28 07/19/2014 1557   CO2 27 05/17/2014 0529   BUN 17 07/19/2014 1557   BUN 10 05/17/2014 0529   CREATININE 0.92 07/19/2014 1557   CREATININE 0.94 06/05/2014 0400   CREATININE 0.95 05/17/2014 0529      Component Value Date/Time   CALCIUM 9.2 07/19/2014 1557   CALCIUM 8.2* 05/17/2014 0529   ALKPHOS 69 07/19/2014 1557   ALKPHOS 44* 07/22/2013 0410   AST 13 07/19/2014 1557   AST 16 07/22/2013 0410   ALT 12 07/19/2014 1557   ALT 12 07/22/2013 0410   BILITOT 0.3 07/19/2014 1557       No results found for: LABCA2  No components found for: LABCA125  No results for input(s): INR in the last 168 hours.     Component Value Date/Time   BILIRUBINUR neg 08/27/2013 0948   PROTEINUR neg 08/27/2013 0948   UROBILINOGEN 0.2 08/27/2013 0948   NITRITE neg 08/27/2013 0948   LEUKOCYTESUR Negative 08/27/2013 0948    STUDIES: No results found.  ASSESSMENT:  IDA.  PLAN:   1. IDA. Ferritin is still pending but as patient reports worsening fatigue and weakenss, will proceed with Feraheme and  supplemental B12.  Will continue with routine evaluation in 8 weeks.  Patient expressed understanding and was in agreement with this plan. He also understands that He can call clinic at any time with any questions, concerns, or complaints.   Dr. Grayland Ormond was available for consultation and review of plan of care for this patient.   Evlyn Kanner, NP   01/03/2015 2:27 PM

## 2015-01-10 ENCOUNTER — Other Ambulatory Visit: Payer: Self-pay | Admitting: Internal Medicine

## 2015-01-21 ENCOUNTER — Other Ambulatory Visit: Payer: Self-pay | Admitting: Internal Medicine

## 2015-01-24 ENCOUNTER — Other Ambulatory Visit: Payer: Self-pay | Admitting: Neurology

## 2015-02-22 ENCOUNTER — Other Ambulatory Visit: Payer: Self-pay | Admitting: Internal Medicine

## 2015-02-22 NOTE — Telephone Encounter (Signed)
Please advise if okay to refill? Last seen in May 2016.

## 2015-02-24 NOTE — Telephone Encounter (Signed)
Rx faxed

## 2015-02-27 ENCOUNTER — Other Ambulatory Visit: Payer: Self-pay | Admitting: *Deleted

## 2015-02-27 DIAGNOSIS — D509 Iron deficiency anemia, unspecified: Secondary | ICD-10-CM

## 2015-02-27 MED ORDER — METOPROLOL SUCCINATE ER 50 MG PO TB24: 50.0000 mg | ORAL_TABLET | Freq: Every day | ORAL | Status: DC

## 2015-02-28 ENCOUNTER — Inpatient Hospital Stay (HOSPITAL_BASED_OUTPATIENT_CLINIC_OR_DEPARTMENT_OTHER): Payer: BC Managed Care – PPO | Admitting: Family Medicine

## 2015-02-28 ENCOUNTER — Inpatient Hospital Stay: Payer: BC Managed Care – PPO | Attending: Family Medicine

## 2015-02-28 ENCOUNTER — Inpatient Hospital Stay: Payer: BC Managed Care – PPO

## 2015-02-28 ENCOUNTER — Encounter: Payer: Self-pay | Admitting: Family Medicine

## 2015-02-28 ENCOUNTER — Telehealth: Payer: Self-pay | Admitting: Internal Medicine

## 2015-02-28 VITALS — BP 125/80 | HR 56 | Temp 98.0°F | Resp 16 | Wt 193.1 lb

## 2015-02-28 VITALS — BP 137/86 | HR 58 | Resp 22

## 2015-02-28 DIAGNOSIS — R5383 Other fatigue: Secondary | ICD-10-CM | POA: Diagnosis not present

## 2015-02-28 DIAGNOSIS — C801 Malignant (primary) neoplasm, unspecified: Secondary | ICD-10-CM

## 2015-02-28 DIAGNOSIS — R600 Localized edema: Secondary | ICD-10-CM | POA: Insufficient documentation

## 2015-02-28 DIAGNOSIS — R531 Weakness: Secondary | ICD-10-CM

## 2015-02-28 DIAGNOSIS — E119 Type 2 diabetes mellitus without complications: Secondary | ICD-10-CM | POA: Diagnosis not present

## 2015-02-28 DIAGNOSIS — Z807 Family history of other malignant neoplasms of lymphoid, hematopoietic and related tissues: Secondary | ICD-10-CM | POA: Insufficient documentation

## 2015-02-28 DIAGNOSIS — I10 Essential (primary) hypertension: Secondary | ICD-10-CM

## 2015-02-28 DIAGNOSIS — Z9884 Bariatric surgery status: Secondary | ICD-10-CM

## 2015-02-28 DIAGNOSIS — R5381 Other malaise: Secondary | ICD-10-CM | POA: Insufficient documentation

## 2015-02-28 DIAGNOSIS — Z79899 Other long term (current) drug therapy: Secondary | ICD-10-CM | POA: Diagnosis not present

## 2015-02-28 DIAGNOSIS — E785 Hyperlipidemia, unspecified: Secondary | ICD-10-CM | POA: Insufficient documentation

## 2015-02-28 DIAGNOSIS — D509 Iron deficiency anemia, unspecified: Secondary | ICD-10-CM

## 2015-02-28 DIAGNOSIS — M1990 Unspecified osteoarthritis, unspecified site: Secondary | ICD-10-CM | POA: Insufficient documentation

## 2015-02-28 DIAGNOSIS — K219 Gastro-esophageal reflux disease without esophagitis: Secondary | ICD-10-CM

## 2015-02-28 HISTORY — DX: Iron deficiency anemia, unspecified: D50.9

## 2015-02-28 LAB — FERRITIN: FERRITIN: 188 ng/mL (ref 24–336)

## 2015-02-28 LAB — HEMOGLOBIN: Hemoglobin: 10.9 g/dL — ABNORMAL LOW (ref 13.0–18.0)

## 2015-02-28 MED ORDER — CYANOCOBALAMIN 1000 MCG/ML IJ SOLN
1000.0000 ug | Freq: Once | INTRAMUSCULAR | Status: AC
  Administered 2015-02-28: 1000 ug via SUBCUTANEOUS
  Filled 2015-02-28: qty 1

## 2015-02-28 MED ORDER — FERUMOXYTOL INJECTION 510 MG/17 ML
510.0000 mg | Freq: Once | INTRAVENOUS | Status: AC
  Administered 2015-02-28: 510 mg via INTRAVENOUS
  Filled 2015-02-28: qty 17

## 2015-02-28 MED ORDER — SODIUM CHLORIDE 0.9 % IV SOLN
INTRAVENOUS | Status: DC
  Administered 2015-02-28: 12:00:00 via INTRAVENOUS
  Filled 2015-02-28: qty 1000

## 2015-02-28 NOTE — Telephone Encounter (Signed)
Pt wanted to make Dr. Gilford Rile aware that his feet are swelling badly since the medication change.msn

## 2015-02-28 NOTE — Telephone Encounter (Signed)
I last saw him in 12/2014 and we did not make any medication changes? Did the hematologist make a medication change? We should try to see him next week to evaluate if worsening leg swelling

## 2015-02-28 NOTE — Progress Notes (Signed)
[place]  Telephone:(336) [phone]  Fax:(336) [phone]     Justin Patel OB: [birth year]  MR#: [id number]  VOZ#:[id number]  Patient Care Team: Jackolyn Confer, MD as PCP - General (Internal Medicine)  CHIEF COMPLAINT:  Chief Complaint  Patient presents with  . Follow-up    Pt reports bruising, has swelling in his ankles, and c/o lethargy...    INTERVAL HISTORY:  Patient is here for further evaluation regarding IDA. He reports recent increase in fatigue and weakness. He denies any blood loss rectally. He has been active but describes that he can noticeably tell that he is more fatigued after routine ADLs. Patient also reports having bilateral lower extremity ankle and feet swelling. He states it does improve with some elevation that he has not been able to keep them up as long as needed.  REVIEW OF SYSTEMS:   Review of Systems  Constitutional: Positive for malaise/fatigue. Negative for fever, chills, weight loss and diaphoresis.  HENT: Negative for congestion, ear discharge, ear pain, hearing loss, nosebleeds, sore throat and tinnitus.   Eyes: Negative for blurred vision, double vision, photophobia, pain, discharge and redness.  Respiratory: Negative for cough, hemoptysis, sputum production, shortness of breath, wheezing and stridor.   Cardiovascular: Positive for leg swelling. Negative for chest pain, palpitations, orthopnea, claudication and PND.  Gastrointestinal: Negative for heartburn, nausea, vomiting, abdominal pain, diarrhea, constipation, blood in stool and melena.  Genitourinary: Negative.   Musculoskeletal: Negative.   Skin: Negative.   Neurological: Positive for weakness. Negative for dizziness, tingling, focal weakness, seizures and headaches.  Endo/Heme/Allergies: Bruises/bleeds easily.  Psychiatric/Behavioral: Negative for depression. The patient is not nervous/anxious and does not have insomnia.    As per HPI. Otherwise, a complete review of  systems is negatve.  ONCOLOGY HISTORY:  No history exists.    PAST MEDICAL HISTORY: Past Medical History  Diagnosis Date  . Hypertension   . Anxiety   . Sleep apnea     hx not now since wt loss  . Diabetes mellitus   . Stones in the urinary tract   . GERD (gastroesophageal reflux disease)   . H/O hiatal hernia   . Headache(784.0)   . Arthritis   . 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 Gastric ulcer   . Degenerative disc disease   . Sacral fracture, closed   . Syncope and collapse   . Chronic kidney disease   . Heart murmur   . Hyperlipidemia   . Hx of congestive heart failure   . Iron deficiency anemia 02/28/2015    PAST SURGICAL HISTORY: Past Surgical History  Procedure Laterality Date  . Hernia repair  2000    hiatal  . Gastric bypass  2010    [place]  . Knee arthroscopy      bilateral, left x 2  . Osteotomy  2001    left  . Tonsillectomy    . Appendectomy    . Nasal sinus surgery      x5  . Uvulopalatoplasty  2011  . Anterior cervical decomp/discectomy fusion  01/26/2012    Procedure: ANTERIOR CERVICAL DECOMPRESSION/DISCECTOMY FUSION 3 LEVELS;  Surgeon: Floyce Stakes, MD;  Location: [facility];  Service: Neurosurgery;  Laterality: N/A;  Cervical three-four Cervical four-five Cervical five-six Cervical six-seven , Anterior cervical decompression/diskectomy, fusion, plate  . Total knee arthroplasty Left 05/2014    Dr. Marry Guan  . Cardiac catheterization      [facility], normal  .  Cardiac catheterization      [place]    FAMILY HISTORY Family History  Problem Relation Age of Onset  . Dementia Mother 58  . Heart disease Father 19  . Diabetes Father   . Cancer Sister     lymphoma, stage 4  . Cancer Sister 90    Ovarian  . Lupus Sister     GYNECOLOGIC HISTORY:  No LMP for male patient.     ADVANCED DIRECTIVES:    HEALTH MAINTENANCE: History  Substance Use Topics  . Smoking status: Never Smoker   . Smokeless  tobacco: Never Used  . Alcohol Use: 0.6 oz/week    1 Glasses of wine per week     Comment: occasional     Colonoscopy:  PAP:  Bone density:  Lipid panel:  Allergies  Allergen Reactions  . Altace [Ramipril] Anaphylaxis  . Levaquin [Levofloxacin In D5w] Hives  . Iohexol Hives     Desc: HIVES     Current Outpatient Prescriptions  Medication Sig Dispense Refill  . acetaminophen (TYLENOL) 500 MG tablet Take 1,000 mg by mouth every 6 (six) hours as needed.    . DULoxetine (CYMBALTA) 60 MG capsule take 1 capsule by mouth once daily 30 capsule 3  . hydrALAZINE (APRESOLINE) 50 MG tablet   0  . losartan (COZAAR) 100 MG tablet take 1 tablet by mouth once daily 30 tablet 5  . metoprolol succinate (TOPROL-XL) 50 MG 24 hr tablet Take 1 tablet (50 mg total) by mouth daily. 30 tablet 6  . NEXIUM 40 MG capsule take 1 capsule by mouth once daily 30 capsule 10  . ofloxacin (OCUFLOX) 0.3 % ophthalmic solution   0  . rosuvastatin (CRESTOR) 10 MG tablet take 1 tablet by mouth once daily 30 tablet 5  . sucralfate (CARAFATE) 1 G tablet Take 1 tablet (1 g total) by mouth 4 (four) times daily. 120 tablet 11  . tamsulosin (FLOMAX) 0.4 MG CAPS capsule Take 0.4 mg by mouth daily.    . traMADol (ULTRAM) 50 MG tablet take 2 tablets by mouth every 8 hours if needed for SEVERE PAIN 180 tablet 2  . VITAMIN D, CHOLECALCIFEROL, PO Take 1,000 mg by mouth.     . clindamycin (CLEOCIN) 300 MG capsule Take 1 capsule (300 mg total) by mouth 3 (three) times daily. (Patient not taking: Reported on 02/28/2015) 30 capsule 0  . hydrALAZINE (APRESOLINE) 50 MG tablet take 1 tablet by mouth four times a day (Patient not taking: Reported on 02/28/2015) 120 tablet 5  . levETIRAcetam (KEPPRA) 500 MG tablet take 1 tablet by mouth twice a day (Patient not taking: Reported on 02/28/2015) 60 tablet 5  . predniSONE (STERAPRED UNI-PAK 21 TAB) 10 MG (21) TBPK tablet   0   Current Facility-Administered Medications  Medication Dose Route  Frequency Provider Last Rate Last Dose  . cyanocobalamin ((VITAMIN B-12)) injection 1,000 mcg  1,000 mcg Subcutaneous Once Evlyn Kanner, NP      . ferumoxytol [facility]) 510 mg in sodium chloride 0.9 % 100 mL IVPB  510 mg Intravenous Once Evlyn Kanner, NP        OBJECTIVE: BP 125/80 mmHg  Pulse 56  Temp(Src) 98 F (36.7 C) (Oral)  Resp 16  Wt 193 lb 2 oz (87.601 kg)  SpO2 99%   Body mass index is 29.37 kg/(m^2).    ECOG FS:1 - Symptomatic but completely ambulatory  General: Well-developed, well-nourished, no acute distress. Eyes: Pink conjunctiva, anicteric sclera. HEENT:  Normocephalic, moist mucous membranes, clear oropharnyx. Lungs: Clear to auscultation bilaterally. Heart: Regular rate and rhythm. No rubs, murmurs, or gallops. Abdomen: Soft, nontender, nondistended. No organomegaly noted, normoactive bowel sounds. Musculoskeletal: 2+ bilateral lower extremity edema, cyanosis, or clubbing. Neuro: Alert, answering all questions appropriately. Cranial nerves grossly intact. Skin: No rashes or petechiae noted. Psych: Normal affect. Lymphatics: No cervical, calvicular, axillary or inguinal LAD.   LAB RESULTS:  Appointment on 02/28/2015  Component Date Value Ref Range Status  . Hemoglobin 02/28/2015 10.9* 13.0 - 18.0 g/dL Final  . Ferritin 02/28/2015 188  24 - 336 ng/mL Final    STUDIES: No results found.  ASSESSMENT:  Iron deficiency anemia  PLAN: 1. IDA. Patient reports being increasingly symptomatic with fatigue and weakness. Ferritin today is 188, hemoglobin 10.9. As patient is symptomatic and reports Feraheme greatly improved symptoms will proceed with 1 dose of 510 mg Feraheme. 2. Bilateral lower extremity edema. Patient with 2+ edema in the lower extremities, over the last 1-2 weeks. Advised patient that he would need to go home elevate feet above heart level and see if there was any improvement. He has appointment with primary care provider, Dr. Ronette Deter, on Monday, 03/03/2015. If edema has not improved over the weekend he is to make sure that this is mentioned to primary care provider.  We will continue with follow-up in approximately 12 weeks.  Patient expressed understanding and was in agreement with this plan. He also understands that He can call clinic at any time with any questions, concerns, or complaints.   Dr. Oliva Bustard was available for consultation and review of plan of care for this patient.   Evlyn Kanner, NP   02/28/2015 11:12 AM

## 2015-02-28 NOTE — Telephone Encounter (Signed)
Please see below.

## 2015-02-28 NOTE — Progress Notes (Signed)
   02/28/15 1010  Clinical Encounter Type  Visited With Patient  Visit Type Initial  Provided pastoral support and presence to patient in the cancer center.  [place] [phone]

## 2015-03-03 ENCOUNTER — Ambulatory Visit (INDEPENDENT_AMBULATORY_CARE_PROVIDER_SITE_OTHER): Payer: BC Managed Care – PPO | Admitting: Internal Medicine

## 2015-03-03 ENCOUNTER — Encounter: Payer: Self-pay | Admitting: Internal Medicine

## 2015-03-03 VITALS — BP 144/87 | HR 68 | Temp 98.2°F | Ht 68.0 in | Wt 185.5 lb

## 2015-03-03 DIAGNOSIS — D509 Iron deficiency anemia, unspecified: Secondary | ICD-10-CM | POA: Diagnosis not present

## 2015-03-03 DIAGNOSIS — R600 Localized edema: Secondary | ICD-10-CM

## 2015-03-03 DIAGNOSIS — I10 Essential (primary) hypertension: Secondary | ICD-10-CM

## 2015-03-03 LAB — COMPREHENSIVE METABOLIC PANEL
ALK PHOS: 71 U/L (ref 39–117)
ALT: 12 U/L (ref 0–53)
AST: 13 U/L (ref 0–37)
Albumin: 3.6 g/dL (ref 3.5–5.2)
BUN: 8 mg/dL (ref 6–23)
CO2: 30 mEq/L (ref 19–32)
Calcium: 9.1 mg/dL (ref 8.4–10.5)
Chloride: 101 mEq/L (ref 96–112)
Creatinine, Ser: 0.84 mg/dL (ref 0.40–1.50)
GFR: 97.76 mL/min (ref >60.00)
Glucose, Bld: 98 mg/dL (ref 70–99)
Potassium: 4 mEq/L (ref 3.5–5.1)
Sodium: 139 mEq/L (ref 135–145)
Total Bilirubin: 0.5 mg/dL (ref 0.2–1.2)
Total Protein: 5.9 g/dL — ABNORMAL LOW (ref 6.0–8.3)

## 2015-03-03 NOTE — Assessment & Plan Note (Signed)
BLE edema in afternoon/evening. Exam normal today. Likely dependent edema, made worse by low protein, anemia and recent warm weather. Will check CMP today. Encouraged limited salt intake, keeping legs elevated.

## 2015-03-03 NOTE — Assessment & Plan Note (Signed)
BP Readings from Last 3 Encounters:  03/03/15 144/87  02/28/15 137/86  02/28/15 125/80   BP well controlled. Renal function with labs.

## 2015-03-03 NOTE — Telephone Encounter (Signed)
Pt is on the schedule to be seen today.

## 2015-03-03 NOTE — Patient Instructions (Signed)
Labs today

## 2015-03-03 NOTE — Progress Notes (Signed)
Subjective:    Patient ID: Justin Patel, male    DOB: 1951-03-22, 64 y.o.   MRN: [id number]  HPI  64YO male presents for follow up.  Edema - Having swelling in bilateral LE. Improved with keeping legs elevated.  Taking Tramadol twice daily for pain, 100mg  with improvement in pain symptoms.  Seen by oncology last week. Treated with iron infusion.  Difficult time for him. His wife was diagnosed with rotator cuff tear and he has been providing care for her.  Past medical, surgical, family and social history per today's encounter.  Review of Systems  Constitutional: Positive for fatigue. Negative for fever, chills, activity change, appetite change and unexpected weight change.  Eyes: Negative for visual disturbance.  Respiratory: Negative for cough and shortness of breath.   Cardiovascular: Positive for leg swelling. Negative for chest pain and palpitations.  Gastrointestinal: Negative for nausea, vomiting, abdominal pain, diarrhea, constipation and abdominal distention.  Genitourinary: Negative for dysuria, urgency and difficulty urinating.  Musculoskeletal: Negative for arthralgias and gait problem.  Skin: Negative for color change and rash.  Hematological: Negative for adenopathy.  Psychiatric/Behavioral: Positive for dysphoric mood. Negative for suicidal ideas and sleep disturbance. The patient is not nervous/anxious.        Objective:    BP 144/87 mmHg  Pulse 68  Temp(Src) 98.2 F (36.8 C) (Oral)  Ht 5\' 8"  (1.727 m)  Wt 185 lb 8 oz (84.142 kg)  BMI 28.21 kg/m2  SpO2 98% Physical Exam  Constitutional: He is oriented to person, place, and time. He appears well-developed and well-nourished. No distress.  HENT:  Head: Normocephalic and atraumatic.  Right Ear: External ear normal.  Left Ear: External ear normal.  Nose: Nose normal.  Mouth/Throat: Oropharynx is clear and moist. No oropharyngeal exudate.  Eyes: Conjunctivae and EOM are normal. Pupils are equal, round,  and reactive to light. Right eye exhibits no discharge. Left eye exhibits no discharge. No scleral icterus.  Neck: Normal range of motion. Neck supple. No tracheal deviation present. No thyromegaly present.  Cardiovascular: Normal rate, regular rhythm and normal heart sounds.  Exam reveals no gallop and no friction rub.   No murmur heard. Pulmonary/Chest: Effort normal and breath sounds normal. No accessory muscle usage. No tachypnea. No respiratory distress. He has no decreased breath sounds. He has no wheezes. He has no rhonchi. He has no rales. He exhibits no tenderness.  Musculoskeletal: Normal range of motion. He exhibits no edema.  Lymphadenopathy:    He has no cervical adenopathy.  Neurological: He is alert and oriented to person, place, and time. No cranial nerve deficit. Coordination normal.  Skin: Skin is warm and dry. No rash noted. He is not diaphoretic. No erythema. No pallor.  Psychiatric: His speech is normal and behavior is normal. Judgment and thought content normal. He exhibits a depressed mood. He expresses no suicidal ideation.          Assessment & Plan:   Problem List Items Addressed This Visit      Unprioritized   Edema leg - Primary    BLE edema in afternoon/evening. Exam normal today. Likely dependent edema, made worse by low protein, anemia and recent warm weather. Will check CMP today. Encouraged limited salt intake, keeping legs elevated.      Relevant Orders   Comprehensive metabolic panel   Essential hypertension, benign    BP Readings from Last 3 Encounters:  03/03/15 144/87  02/28/15 137/86  02/28/15 125/80   BP well controlled. Renal  function with labs.      Iron deficiency anemia    Recent iron deficiency anemia, s/p iron infusion last week. Will follow labs from [facility].          Return in about 4 weeks (around 03/31/2015) for Recheck.

## 2015-03-03 NOTE — Progress Notes (Signed)
Pre visit review using our clinic review tool, if applicable. No additional management support is needed unless otherwise documented below in the visit note. 

## 2015-03-03 NOTE — Assessment & Plan Note (Signed)
Recent iron deficiency anemia, s/p iron infusion last week. Will follow labs from [facility].

## 2015-03-20 ENCOUNTER — Other Ambulatory Visit: Payer: Self-pay | Admitting: Internal Medicine

## 2015-03-31 ENCOUNTER — Ambulatory Visit: Payer: BC Managed Care – PPO | Admitting: Internal Medicine

## 2015-04-16 ENCOUNTER — Encounter: Payer: Self-pay | Admitting: Internal Medicine

## 2015-04-16 ENCOUNTER — Ambulatory Visit (INDEPENDENT_AMBULATORY_CARE_PROVIDER_SITE_OTHER): Payer: BC Managed Care – PPO | Admitting: Internal Medicine

## 2015-04-16 ENCOUNTER — Encounter: Payer: Self-pay | Admitting: *Deleted

## 2015-04-16 VITALS — BP 139/91 | HR 95 | Temp 98.2°F | Ht 68.0 in | Wt 192.5 lb

## 2015-04-16 DIAGNOSIS — R600 Localized edema: Secondary | ICD-10-CM

## 2015-04-16 DIAGNOSIS — I10 Essential (primary) hypertension: Secondary | ICD-10-CM | POA: Diagnosis not present

## 2015-04-16 DIAGNOSIS — G894 Chronic pain syndrome: Secondary | ICD-10-CM

## 2015-04-16 MED ORDER — HYDROCODONE-ACETAMINOPHEN 5-325 MG PO TABS: 1.0000 | ORAL_TABLET | Freq: Four times a day (QID) | ORAL | Status: DC | PRN

## 2015-04-16 MED ORDER — TRAMADOL HCL 50 MG PO TABS: 100.0000 mg | ORAL_TABLET | Freq: Three times a day (TID) | ORAL | Status: DC | PRN

## 2015-04-16 MED ORDER — FUROSEMIDE 20 MG PO TABS: 20.0000 mg | ORAL_TABLET | Freq: Every day | ORAL | Status: DC

## 2015-04-16 MED ORDER — ROSUVASTATIN CALCIUM 10 MG PO TABS: 10.0000 mg | ORAL_TABLET | Freq: Every day | ORAL | Status: DC

## 2015-04-16 NOTE — Progress Notes (Signed)
Subjective:    Patient ID: Justin Patel, male    DOB: July 01, 1951, 64 y.o.   MRN: [id number]  HPI 64YO male presents for followup.  Chronic pain - Taking 100mg  Tramadol every 8 hr with no improvement in pain. Pain described as severe in knees, shoulders, hips. Unable to take NSAIDS because of GI bleeding. Tried Lyrica in the past, however had severe swelling. Also took Neurontin in the past, but unsure of side effects. Requests Hydrocodone or Oxycodone for pain control. Declines referral to pain management, as he did not find this helpful in the past.  Checking BG, mostly near 80.  Also concerned about recurrent swelling in BLE since stopping Lasix. Would like to restart.  Wt Readings from Last 3 Encounters:  04/16/15 192 lb 8 oz (87.317 kg)  03/03/15 185 lb 8 oz (84.142 kg)  02/28/15 193 lb 2 oz (87.601 kg)   BP Readings from Last 3 Encounters:  04/16/15 139/91  03/03/15 144/87  02/28/15 137/86      Past Medical History  Diagnosis Date  . Hypertension   . Anxiety   . Sleep apnea     hx not now since wt loss  . Diabetes mellitus   . Stones in the urinary tract   . GERD (gastroesophageal reflux disease)   . H/O hiatal hernia   . Headache(784.0)   . Arthritis   . 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 Gastric ulcer   . Degenerative disc disease   . Sacral fracture, closed   . Syncope and collapse   . Chronic kidney disease   . Heart murmur   . Hyperlipidemia   . Hx of congestive heart failure   . Iron deficiency anemia 02/28/2015   Family History  Problem Relation Age of Onset  . Dementia Mother 56  . Heart disease Father 17  . Diabetes Father   . Cancer Sister     lymphoma, stage 4  . Cancer Sister 68    Ovarian  . Lupus Sister    Past Surgical History  Procedure Laterality Date  . Hernia repair  2000    hiatal  . Gastric bypass  2010    [place]  . Knee arthroscopy      bilateral, left x 2  . Osteotomy  2001    left  .  Tonsillectomy    . Appendectomy    . Nasal sinus surgery      x5  . Uvulopalatoplasty  2011  . Anterior cervical decomp/discectomy fusion  01/26/2012    Procedure: ANTERIOR CERVICAL DECOMPRESSION/DISCECTOMY FUSION 3 LEVELS;  Surgeon: Floyce Stakes, MD;  Location: [facility];  Service: Neurosurgery;  Laterality: N/A;  Cervical three-four Cervical four-five Cervical five-six Cervical six-seven , Anterior cervical decompression/diskectomy, fusion, plate  . Total knee arthroplasty Left 05/2014    Dr. Marry Guan  . Cardiac catheterization      [facility], normal  . Cardiac catheterization      [place]   Social History   Social History  . Marital Status: Married    Spouse Name: N/A  . Number of Children: N/A  . Years of Education: N/A   Social History Main Topics  . Smoking status: Never Smoker   . Smokeless tobacco: Never Used  . Alcohol Use: 0.6 oz/week    1 Glasses of wine per week     Comment: occasional  . Drug Use: No  . Sexual Activity:  Partners: Female   Other Topics Concern  . None   Social History Narrative   Lives in [place] with wife, [place]. 2 sons, William Hamburger, Lennette Bihari 76.. 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  Eyes: Negative for visual disturbance.  Respiratory: Negative for cough and shortness of breath.   Cardiovascular: Positive for leg swelling. Negative for chest pain and palpitations.  Gastrointestinal: Negative for nausea, abdominal pain, diarrhea, constipation and abdominal distention.  Genitourinary: Negative for dysuria, urgency and difficulty urinating.  Musculoskeletal: Positive for myalgias,  back pain and arthralgias. Negative for gait problem.  Skin: Negative for color change and rash.  Neurological: Negative for weakness and light-headedness.  Hematological: Negative for adenopathy.  Psychiatric/Behavioral: Negative for sleep disturbance and dysphoric mood. The patient is not nervous/anxious.        Objective:    BP 139/91 mmHg  Pulse 95  Temp(Src) 98.2 F (36.8 C) (Oral)  Ht 5\' 8"  (1.727 m)  Wt 192 lb 8 oz (87.317 kg)  BMI 29.28 kg/m2  SpO2 98% Physical Exam  Constitutional: He is oriented to person, place, and time. He appears well-developed and well-nourished. No distress.  HENT:  Head: Normocephalic and atraumatic.  Right Ear: External ear normal.  Left Ear: External ear normal.  Nose: Nose normal.  Mouth/Throat: Oropharynx is clear and moist. No oropharyngeal exudate.  Eyes: Conjunctivae and EOM are normal. Pupils are equal, round, and reactive to light. Right eye exhibits no discharge. Left eye exhibits no discharge. No scleral icterus.  Neck: Normal range of motion. Neck supple. No tracheal deviation present. No thyromegaly present.  Cardiovascular: Normal rate, regular rhythm and normal heart sounds.  Exam reveals no gallop and no friction rub.   No murmur heard. Pulmonary/Chest: Effort normal and breath sounds normal. No accessory muscle usage. No tachypnea. No respiratory distress. He has no decreased breath sounds. He has no wheezes. He has no rhonchi. He has no rales. He exhibits no tenderness.  Musculoskeletal: Normal range of motion. He exhibits no edema.       Right knee: He exhibits normal range of motion.       Left knee: He exhibits normal range of motion.       Thoracic back: He exhibits pain. He exhibits normal range of motion and no tenderness.       Lumbar back: He exhibits pain. He exhibits normal range of motion and no tenderness.  Lymphadenopathy:    He has no cervical adenopathy.  Neurological: He is alert and oriented to person, place,  and time. No cranial nerve deficit. Coordination normal.  Skin: Skin is warm and dry. No rash noted. He is not diaphoretic. No erythema. No pallor.  Psychiatric: He has a normal mood and affect. His behavior is normal. Judgment and thought content normal.          Assessment & Plan:   Problem List Items Addressed This Visit      Unprioritized   Benign essential HTN   Relevant Medications   furosemide (LASIX) 20 MG tablet   rosuvastatin (CRESTOR) 10 MG tablet   Chronic pain syndrome - Primary  Severe chronic pain from OA. Unable to take NSAIDS because of GI bleeding. Intolerant of Lyrica and Neurontin. Minimal improvement with Tramadol. Recommended referral to pain management for consideration of Nucynta. He prefers to hold off on this for now. Discussed that we cannot write for chronic narcotic pain medications here. He is at higher risk of abuse given history of alcohol use. Will use short course of Hydrocodone for severe pain only. He understands the risks of this medication. UDS today. Controlled substance contract today. Follow up 4 weeks and prn.      Relevant Medications   HYDROcodone-acetaminophen (NORCO/VICODIN) 5-325 MG per tablet   traMADol (ULTRAM) 50 MG tablet   Edema leg    Chronic BLE edema. Will restart Furosemide. Recheck electrolytes at next visit in 4 weeks.      Relevant Medications   furosemide (LASIX) 20 MG tablet       Return in about 4 weeks (around 05/14/2015) for Recheck.

## 2015-04-16 NOTE — Assessment & Plan Note (Signed)
Severe chronic pain from OA. Unable to take NSAIDS because of GI bleeding. Intolerant of Lyrica and Neurontin. Minimal improvement with Tramadol. Recommended referral to pain management for consideration of Nucynta. He prefers to hold off on this for now. Discussed that we cannot write for chronic narcotic pain medications here. He is at higher risk of abuse given history of alcohol use. Will use short course of Hydrocodone for severe pain only. He understands the risks of this medication. UDS today. Controlled substance contract today. Follow up 4 weeks and prn.

## 2015-04-16 NOTE — Progress Notes (Signed)
Pre visit review using our clinic review tool, if applicable. No additional management support is needed unless otherwise documented below in the visit note. 

## 2015-04-16 NOTE — Assessment & Plan Note (Signed)
Chronic BLE edema. Will restart Furosemide. Recheck electrolytes at next visit in 4 weeks.

## 2015-04-16 NOTE — Patient Instructions (Addendum)
Start Furosemide 20mg  daily.  Continue Tramadol, however use Hydrocodone for severe pain only.  Follow up in 4 weeks.

## 2015-04-28 ENCOUNTER — Other Ambulatory Visit: Payer: Self-pay | Admitting: Internal Medicine

## 2015-05-14 ENCOUNTER — Ambulatory Visit (INDEPENDENT_AMBULATORY_CARE_PROVIDER_SITE_OTHER): Payer: BC Managed Care – PPO | Admitting: Internal Medicine

## 2015-05-14 ENCOUNTER — Encounter: Payer: Self-pay | Admitting: Internal Medicine

## 2015-05-14 VITALS — BP 147/91 | HR 74 | Temp 98.0°F | Ht 68.0 in | Wt 185.2 lb

## 2015-05-14 DIAGNOSIS — G894 Chronic pain syndrome: Secondary | ICD-10-CM | POA: Diagnosis not present

## 2015-05-14 DIAGNOSIS — D6489 Other specified anemias: Secondary | ICD-10-CM | POA: Diagnosis not present

## 2015-05-14 DIAGNOSIS — F1010 Alcohol abuse, uncomplicated: Secondary | ICD-10-CM

## 2015-05-14 MED ORDER — CYCLOBENZAPRINE HCL 5 MG PO TABS
5.0000 mg | ORAL_TABLET | Freq: Three times a day (TID) | ORAL | Status: DC | PRN
Start: 2015-05-14 — End: 2015-06-25

## 2015-05-14 NOTE — Assessment & Plan Note (Signed)
Followed at [name]. Has follow up scheduled for repeat CBC and iron infusion.

## 2015-05-14 NOTE — Progress Notes (Signed)
Subjective:    Patient ID: Justin Patel, male    DOB: Jul 01, 1951, 64 y.o.   MRN: [id number]  HPI  64YO male presents for follow up.  Last seen 04/16/2015 for chronic pain. Declined referral to pain management.  Seeing a new counselor. Encarnacion Chu in [place]. For ETOH abuse.  Chronic pain - Continues to have pain in neck and back. Left knee also more painful recently with changes in weather. Continues to use Tramadol. Took Hydrocodone for severe pain. Also sees Dr. Marry Guan this week and Dr. Tomi Likens later this month. Notes some aching pain in upper back and neck. Described as cramping pain that does not radiate.    Wt Readings from Last 3 Encounters:  05/14/15 185 lb 4 oz (84.029 kg)  04/16/15 192 lb 8 oz (87.317 kg)  03/03/15 185 lb 8 oz (84.142 kg)   BP Readings from Last 3 Encounters:  05/14/15 147/91  04/16/15 139/91  03/03/15 144/87    Past Medical History  Diagnosis Date  . Hypertension   . Anxiety   . Sleep apnea     hx not now since wt loss  . Diabetes mellitus   . Stones in the urinary tract   . GERD (gastroesophageal reflux disease)   . H/O hiatal hernia   . Headache(784.0)   . Arthritis   . 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 Gastric ulcer   . Degenerative disc disease   . Sacral fracture, closed ([place])   . Syncope and collapse   . Chronic kidney disease   . Heart murmur   . Hyperlipidemia   . Hx of congestive heart failure   . Iron deficiency anemia 02/28/2015   Family History  Problem Relation Age of Onset  . Dementia Mother 76  . Heart disease Father 21  . Diabetes Father   . Cancer Sister     lymphoma, stage 4  . Cancer Sister 44    Ovarian  . Lupus Sister    Past Surgical History  Procedure Laterality Date  . Hernia repair  2000    hiatal  . Gastric bypass  2010    [place]  . Knee arthroscopy      bilateral, left x 2  . Osteotomy  2001    left  . Tonsillectomy    . Appendectomy    . Nasal sinus surgery        x5  . Uvulopalatoplasty  2011  . Anterior cervical decomp/discectomy fusion  01/26/2012    Procedure: ANTERIOR CERVICAL DECOMPRESSION/DISCECTOMY FUSION 3 LEVELS;  Surgeon: Floyce Stakes, MD;  Location: [facility];  Service: Neurosurgery;  Laterality: N/A;  Cervical three-four Cervical four-five Cervical five-six Cervical six-seven , Anterior cervical decompression/diskectomy, fusion, plate  . Total knee arthroplasty Left 05/2014    Dr. Marry Guan  . Cardiac catheterization      [facility], normal  . Cardiac catheterization      [place]   Social History   Social History  . Marital Status: Married    Spouse Name: N/A  . Number of Children: N/A  . Years of Education: N/A   Social History Main Topics  . Smoking status: Never Smoker   . Smokeless tobacco: Never Used  . Alcohol Use: 0.6 oz/week    1 Glasses of wine per week     Comment: occasional  . Drug Use: No  . Sexual Activity:    Partners: Female   Other  Topics Concern  . None   Social History Narrative   Lives in [place] with wife, [place]. 2 sons, William Hamburger, Lennette Bihari 89.. 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  Eyes: Negative for visual disturbance.  Respiratory: Negative for cough and shortness of breath.   Cardiovascular: Negative for chest pain, palpitations and leg swelling.  Gastrointestinal: Negative for nausea, vomiting, abdominal pain, diarrhea, constipation and abdominal distention.  Genitourinary: Negative for dysuria, urgency and difficulty urinating.  Musculoskeletal: Positive for myalgias, back pain, arthralgias and neck pain. Negative for gait problem.   Skin: Negative for color change and rash.  Hematological: Negative for adenopathy.  Psychiatric/Behavioral: Negative for sleep disturbance and dysphoric mood. The patient is not nervous/anxious.        Objective:    BP 147/91 mmHg  Pulse 74  Temp(Src) 98 F (36.7 C) (Oral)  Ht 5\' 8"  (1.727 m)  Wt 185 lb 4 oz (84.029 kg)  BMI 28.17 kg/m2  SpO2 100% Physical Exam  Constitutional: He is oriented to person, place, and time. He appears well-developed and well-nourished. No distress.  HENT:  Head: Normocephalic and atraumatic.  Right Ear: External ear normal.  Left Ear: External ear normal.  Nose: Nose normal.  Mouth/Throat: Oropharynx is clear and moist. No oropharyngeal exudate.  Eyes: Conjunctivae and EOM are normal. Pupils are equal, round, and reactive to light. Right eye exhibits no discharge. Left eye exhibits no discharge. No scleral icterus.  Neck: Normal range of motion. Neck supple. No tracheal deviation present. No thyromegaly present.  Cardiovascular: Normal rate, regular rhythm and normal heart sounds.  Exam reveals no gallop and no friction rub.   No murmur heard. Pulmonary/Chest: Effort normal and breath sounds normal. No accessory muscle usage. No tachypnea. No respiratory distress. He has no decreased breath sounds. He has no wheezes. He has no rhonchi. He has no rales. He exhibits no tenderness.  Musculoskeletal: Normal range of motion. He exhibits no edema.       Cervical back: He exhibits tenderness, pain and spasm. He exhibits normal range of motion and no bony tenderness.  Lymphadenopathy:    He has no cervical adenopathy.  Neurological: He is alert and oriented to person, place, and time. No cranial nerve deficit. Coordination normal.  Skin: Skin is warm and dry. No rash noted. He is not diaphoretic. No erythema. No pallor.  Psychiatric: He has a normal mood and affect. His behavior is normal. Judgment and thought content normal.          Assessment &  Plan:   Problem List Items Addressed This Visit      Unprioritized   Alcohol abuse    Encouraged him to continue counseling for ETOH abuse.      Anemia    Followed at [facility]. Has follow up scheduled for repeat CBC and iron infusion.      Chronic pain syndrome - Primary    Chronic pain in joints, spine. Will add Flexeril. Referral placed to pain management. Question if ESI would be helpful. Continue prn Tramadol. Discussed risks of these medications.      Relevant Orders  Ambulatory referral to Pain Clinic       Return in about 3 months (around 08/14/2015) for Recheck.

## 2015-05-14 NOTE — Progress Notes (Signed)
Pre visit review using our clinic review tool, if applicable. No additional management support is needed unless otherwise documented below in the visit note. 

## 2015-05-14 NOTE — Patient Instructions (Signed)
We will set up evaluation with Dr. Primus Bravo.  Follow up in 3 months.

## 2015-05-14 NOTE — Assessment & Plan Note (Signed)
Encouraged him to continue counseling for ETOH abuse.

## 2015-05-14 NOTE — Assessment & Plan Note (Signed)
Chronic pain in joints, spine. Will add Flexeril. Referral placed to pain management. Question if ESI would be helpful. Continue prn Tramadol. Discussed risks of these medications.

## 2015-05-15 ENCOUNTER — Other Ambulatory Visit: Payer: Self-pay | Admitting: Internal Medicine

## 2015-05-20 ENCOUNTER — Other Ambulatory Visit: Payer: Self-pay | Admitting: Internal Medicine

## 2015-05-23 ENCOUNTER — Encounter: Payer: Self-pay | Admitting: Internal Medicine

## 2015-05-23 ENCOUNTER — Inpatient Hospital Stay: Payer: BC Managed Care – PPO

## 2015-05-23 ENCOUNTER — Inpatient Hospital Stay: Payer: BC Managed Care – PPO | Attending: Internal Medicine

## 2015-05-23 ENCOUNTER — Inpatient Hospital Stay (HOSPITAL_BASED_OUTPATIENT_CLINIC_OR_DEPARTMENT_OTHER): Payer: BC Managed Care – PPO | Admitting: Internal Medicine

## 2015-05-23 VITALS — BP 110/72 | HR 69 | Temp 97.0°F | Resp 18 | Ht 68.0 in | Wt 187.4 lb

## 2015-05-23 VITALS — BP 118/74 | HR 65

## 2015-05-23 DIAGNOSIS — R195 Other fecal abnormalities: Secondary | ICD-10-CM | POA: Diagnosis not present

## 2015-05-23 DIAGNOSIS — D509 Iron deficiency anemia, unspecified: Secondary | ICD-10-CM | POA: Diagnosis present

## 2015-05-23 DIAGNOSIS — R5383 Other fatigue: Secondary | ICD-10-CM | POA: Insufficient documentation

## 2015-05-23 DIAGNOSIS — E119 Type 2 diabetes mellitus without complications: Secondary | ICD-10-CM | POA: Insufficient documentation

## 2015-05-23 DIAGNOSIS — K909 Intestinal malabsorption, unspecified: Secondary | ICD-10-CM | POA: Diagnosis not present

## 2015-05-23 DIAGNOSIS — I129 Hypertensive chronic kidney disease with stage 1 through stage 4 chronic kidney disease, or unspecified chronic kidney disease: Secondary | ICD-10-CM | POA: Diagnosis not present

## 2015-05-23 DIAGNOSIS — Z8719 Personal history of other diseases of the digestive system: Secondary | ICD-10-CM | POA: Diagnosis not present

## 2015-05-23 DIAGNOSIS — Z79899 Other long term (current) drug therapy: Secondary | ICD-10-CM | POA: Insufficient documentation

## 2015-05-23 DIAGNOSIS — E875 Hyperkalemia: Secondary | ICD-10-CM | POA: Diagnosis not present

## 2015-05-23 DIAGNOSIS — K219 Gastro-esophageal reflux disease without esophagitis: Secondary | ICD-10-CM

## 2015-05-23 DIAGNOSIS — D518 Other vitamin B12 deficiency anemias: Secondary | ICD-10-CM

## 2015-05-23 DIAGNOSIS — D508 Other iron deficiency anemias: Secondary | ICD-10-CM

## 2015-05-23 DIAGNOSIS — N189 Chronic kidney disease, unspecified: Secondary | ICD-10-CM

## 2015-05-23 DIAGNOSIS — Z9884 Bariatric surgery status: Secondary | ICD-10-CM

## 2015-05-23 DIAGNOSIS — E785 Hyperlipidemia, unspecified: Secondary | ICD-10-CM

## 2015-05-23 LAB — CBC WITH DIFFERENTIAL/PLATELET
Basophils Absolute: 0.1 10*3/uL (ref 0–0.1)
Basophils Relative: 1 %
EOS ABS: 0.2 10*3/uL (ref 0–0.7)
Eosinophils Relative: 3 %
HEMATOCRIT: 35 % — AB (ref 40.0–52.0)
HEMOGLOBIN: 11.6 g/dL — AB (ref 13.0–18.0)
LYMPHS ABS: 1.3 10*3/uL (ref 1.0–3.6)
LYMPHS PCT: 20 %
MCH: 32.7 pg (ref 26.0–34.0)
MCHC: 33.1 g/dL (ref 32.0–36.0)
MCV: 98.9 fL (ref 80.0–100.0)
MONOS PCT: 8 %
Monocytes Absolute: 0.5 10*3/uL (ref 0.2–1.0)
NEUTROS ABS: 4.7 10*3/uL (ref 1.4–6.5)
NEUTROS PCT: 68 %
Platelets: 179 10*3/uL (ref 150–440)
RBC: 3.54 MIL/uL — AB (ref 4.40–5.90)
RDW: 14.7 % — ABNORMAL HIGH (ref 11.5–14.5)
WBC: 6.7 10*3/uL (ref 3.8–10.6)

## 2015-05-23 LAB — IRON AND TIBC
Iron: 88 ug/dL (ref 45–182)
SATURATION RATIOS: 31 % (ref 17.9–39.5)
TIBC: 288 ug/dL (ref 250–450)
UIBC: 200 ug/dL

## 2015-05-23 LAB — VITAMIN B12: Vitamin B-12: 230 pg/mL (ref 180–914)

## 2015-05-23 LAB — FERRITIN: FERRITIN: 287 ng/mL (ref 24–336)

## 2015-05-23 MED ORDER — SODIUM CHLORIDE 0.9 % IV SOLN
INTRAVENOUS | Status: DC
  Administered 2015-05-23: 12:00:00 via INTRAVENOUS
  Filled 2015-05-23: qty 1000

## 2015-05-23 MED ORDER — CYANOCOBALAMIN 1000 MCG/ML IJ SOLN
1000.0000 ug | Freq: Once | INTRAMUSCULAR | Status: AC
  Administered 2015-05-23: 1000 ug via INTRAMUSCULAR
  Filled 2015-05-23: qty 1

## 2015-05-23 MED ORDER — SODIUM CHLORIDE 0.9 % IV SOLN
510.0000 mg | Freq: Once | INTRAVENOUS | Status: AC
  Administered 2015-05-23: 510 mg via INTRAVENOUS
  Filled 2015-05-23: qty 17

## 2015-05-23 NOTE — Progress Notes (Signed)
[facility] OFFICE PROGRESS NOTE  Patient Care Team: Jackolyn Confer, MD as PCP - General (Internal Medicine)   SUMMARY OF ONCOLOGIC HISTORY:  # IRON DEF ANEMIA  S/p gastric bypass [2010] on IV iron q 89M/ B12 injections  INTERVAL HISTORY:  A very pleasant 64 year old male patient with above history of iron deficiency anemia for malabsorption from previous gastric bypass is here for follow-up.  Patient complains of being tired in the last few weeks. He also complains of black colored stools [not on iron pills]. He denies any abdominal pain. Denies any chest pain or shortness of breath or cough.  REVIEW OF SYSTEMS:  A complete 10 point review of system is done which is negative except mentioned above/history of present illness.   PAST MEDICAL HISTORY :  Past Medical History  Diagnosis Date  . Hypertension   . Anxiety   . Sleep apnea     hx not now since wt loss  . Diabetes mellitus   . Stones in the urinary tract   . GERD (gastroesophageal reflux disease)   . H/O hiatal hernia   . Headache(784.0)   . Arthritis   . 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 Gastric ulcer   . Degenerative disc disease   . Sacral fracture, closed ([place])   . Syncope and collapse   . Chronic kidney disease   . Heart murmur   . Hyperlipidemia   . Hx of congestive heart failure   . Iron deficiency anemia 02/28/2015    PAST SURGICAL HISTORY :   Past Surgical History  Procedure Laterality Date  . Hernia repair  2000    hiatal  . Gastric bypass  2010    [place]  . Knee arthroscopy      bilateral, left x 2  . Osteotomy  2001    left  . Tonsillectomy    . Appendectomy    . Nasal sinus surgery      x5  . Uvulopalatoplasty  2011  . Anterior cervical decomp/discectomy fusion  01/26/2012    Procedure: ANTERIOR CERVICAL DECOMPRESSION/DISCECTOMY FUSION 3 LEVELS;  Surgeon: Floyce Stakes, MD;  Location: [facility];  Service: Neurosurgery;  Laterality: N/A;   Cervical three-four Cervical four-five Cervical five-six Cervical six-seven , Anterior cervical decompression/diskectomy, fusion, plate  . Total knee arthroplasty Left 05/2014    Dr. Marry Guan  . Cardiac catheterization      [facility], normal  . Cardiac catheterization      [place]    FAMILY HISTORY :   Family History  Problem Relation Age of Onset  . Dementia Mother 40  . Heart disease Father 25  . Diabetes Father   . Lymphoma Sister     lymphoma, stage 4  . Ovarian cancer Sister 68    Ovarian  . Lupus Sister     SOCIAL HISTORY:   Social History  Substance Use Topics  . Smoking status: Never Smoker   . Smokeless tobacco: Never Used  . Alcohol Use: 0.6 oz/week    1 Glasses of wine per week     Comment: occasional    ALLERGIES:  is allergic to altace; levaquin; lyrica; metformin; and iohexol.  MEDICATIONS:  Current Outpatient Prescriptions  Medication Sig Dispense Refill  . acetaminophen (TYLENOL) 500 MG tablet Take 1,000 mg by mouth every 6 (six) hours as needed.    Marland Kitchen amLODipine (NORVASC) 5 MG tablet take 1 tablet by mouth daily  90 tablet 3  . cyclobenzaprine (FLEXERIL) 5 MG tablet Take 1 tablet (5 mg total) by mouth 3 (three) times daily as needed for muscle spasms. 30 tablet 1  . DULoxetine (CYMBALTA) 60 MG capsule take 1 capsule by mouth once daily 30 capsule 3  . furosemide (LASIX) 20 MG tablet Take 1 tablet (20 mg total) by mouth daily. 30 tablet 3  . hydrALAZINE (APRESOLINE) 50 MG tablet take 1 tablet by mouth four times a day 120 tablet 5  . levETIRAcetam (KEPPRA) 500 MG tablet take 1 tablet by mouth twice a day 60 tablet 5  . losartan (COZAAR) 100 MG tablet take 1 tablet by mouth once daily 30 tablet 5  . metoprolol succinate (TOPROL-XL) 50 MG 24 hr tablet Take 1 tablet (50 mg total) by mouth daily. 30 tablet 6  . NEXIUM 40 MG capsule take 1 capsule by mouth once daily 30 capsule 10  . rosuvastatin (CRESTOR) 10 MG tablet Take 1 tablet (10 mg total) by  mouth daily. 30 tablet 5  . sucralfate (CARAFATE) 1 G tablet take 1 tablet by mouth four times a day 120 tablet 11  . tamsulosin (FLOMAX) 0.4 MG CAPS capsule Take 0.4 mg by mouth daily.    . traMADol (ULTRAM) 50 MG tablet Take 2 tablets (100 mg total) by mouth 3 (three) times daily as needed. 180 tablet 2  . VITAMIN D, CHOLECALCIFEROL, PO Take 1,000 mg by mouth daily.      No current facility-administered medications for this visit.    PHYSICAL EXAMINATION: ECOG PERFORMANCE STATUS: 1 - Symptomatic but completely ambulatory  BP 110/72 mmHg  Pulse 69  Temp(Src) 97 F (36.1 C) (Tympanic)  Resp 18  Ht 5\' 8"  (1.727 m)  Wt 187 lb 6.3 oz (85 kg)  BMI 28.50 kg/m2  SpO2 98%  Filed Weights   05/23/15 1026  Weight: 187 lb 6.3 oz (85 kg)    GENERAL: Well-nourished well-developed; Alert, no distress and comfortable.   Alone; Obese.  EYES: no pallor or icterus OROPHARYNX: no thrush or ulceration; good dentition  NECK: supple, no masses felt LYMPH:  no palpable lymphadenopathy in the cervical, axillary or inguinal regions LUNGS: clear to auscultation and  No wheeze or crackles HEART/CVS: regular rate & rhythm and no murmurs; 2 plus edema lower extremity edema ABDOMEN:abdomen soft, non-tender and normal bowel sounds Musculoskeletal:no cyanosis of digits and no clubbing  PSYCH: alert & oriented x 3 with fluent speech NEURO: no focal motor/sensory deficits SKIN:  no rashes or significant lesions  LABORATORY DATA:  I have reviewed the data as listed    Component Value Date/Time   NA 139 03/03/2015 1007   NA 140 05/17/2014 0529   K 4.0 03/03/2015 1007   K 3.8 05/17/2014 0529   CL 101 03/03/2015 1007   CL 105 05/17/2014 0529   CO2 30 03/03/2015 1007   CO2 27 05/17/2014 0529   GLUCOSE 98 03/03/2015 1007   GLUCOSE 112* 05/17/2014 0529   BUN 8 03/03/2015 1007   BUN 10 05/17/2014 0529   CREATININE 0.84 03/03/2015 1007   CREATININE 0.92 07/19/2014 1557   CREATININE 0.95 05/17/2014  0529   CALCIUM 9.1 03/03/2015 1007   CALCIUM 8.2* 05/17/2014 0529   PROT 5.9* 03/03/2015 1007   PROT 4.5* 07/22/2013 0410   ALBUMIN 3.6 03/03/2015 1007   ALBUMIN 2.3* 07/22/2013 0410   AST 13 03/03/2015 1007   AST 16 07/22/2013 0410   ALT 12 03/03/2015 1007   ALT 12  07/22/2013 0410   ALKPHOS 71 03/03/2015 1007   ALKPHOS 44* 07/22/2013 0410   BILITOT 0.5 03/03/2015 1007   BILITOT 0.4 07/22/2013 0410   GFRNONAA 87* 06/05/2014 0400   GFRNONAA >60 05/17/2014 0529   GFRNONAA >60 05/01/2014 0928   GFRAA >90 06/05/2014 0400   GFRAA >60 05/17/2014 0529   GFRAA >60 05/01/2014 0928    No results found for: SPEP, UPEP  Lab Results  Component Value Date   WBC 6.7 05/23/2015   NEUTROABS 4.7 05/23/2015   HGB 11.6* 05/23/2015   HCT 35.0* 05/23/2015   MCV 98.9 05/23/2015   PLT 179 05/23/2015      Chemistry      Component Value Date/Time   NA 139 03/03/2015 1007   NA 140 05/17/2014 0529   K 4.0 03/03/2015 1007   K 3.8 05/17/2014 0529   CL 101 03/03/2015 1007   CL 105 05/17/2014 0529   CO2 30 03/03/2015 1007   CO2 27 05/17/2014 0529   BUN 8 03/03/2015 1007   BUN 10 05/17/2014 0529   CREATININE 0.84 03/03/2015 1007   CREATININE 0.92 07/19/2014 1557   CREATININE 0.95 05/17/2014 0529      Component Value Date/Time   CALCIUM 9.1 03/03/2015 1007   CALCIUM 8.2* 05/17/2014 0529   ALKPHOS 71 03/03/2015 1007   ALKPHOS 44* 07/22/2013 0410   AST 13 03/03/2015 1007   AST 16 07/22/2013 0410   ALT 12 03/03/2015 1007   ALT 12 07/22/2013 0410   BILITOT 0.5 03/03/2015 1007   BILITOT 0.4 07/22/2013 0410       RADIOGRAPHIC STUDIES: I have personally reviewed the radiological images as listed and agreed with the findings in the report. No results found.   ASSESSMENT & PLAN:   # IDA sec to Malabsorbtion from previous gastric bypass surgery. Today hemoglobin is 11.6; however he feels extremely tired/asymptomatically. Proceed with IV iron infusion today; he has been getting it every 3  months.  # Black colored stools/history of GI bleed in the past- I do not see any concerns for any active GI bleeding at this time. I recommend he follows up with his gastroenterologist for a repeat colonoscopy/EGD. Patient verbalized.  Patient will follow-up with [place] in approximately 3 months with labs.   No orders of the defined types were placed in this encounter.   All questions were answered. The patient knows to call the clinic with any problems, questions or concerns. No barriers to learning was detected. I spent 15 minutes counseling the patient face to face. The total time spent in the appointment was 30 minutes and more than 50% was on counseling and review of test results     Cammie Sickle, MD 05/23/2015 10:30 AM

## 2015-06-05 ENCOUNTER — Telehealth: Payer: Self-pay | Admitting: Cardiovascular Disease

## 2015-06-05 NOTE — Telephone Encounter (Signed)
Patient not interested in being seen again at this time PCP doing med management Patient aware to call office if he needs to be seen in future  Deleting recall.

## 2015-06-06 ENCOUNTER — Encounter: Payer: Self-pay | Admitting: Neurology

## 2015-06-06 ENCOUNTER — Ambulatory Visit (INDEPENDENT_AMBULATORY_CARE_PROVIDER_SITE_OTHER): Payer: BC Managed Care – PPO | Admitting: Neurology

## 2015-06-06 VITALS — BP 140/80 | HR 106 | Ht 68.0 in | Wt 192.6 lb

## 2015-06-06 DIAGNOSIS — S065X9A Traumatic subdural hemorrhage with loss of consciousness of unspecified duration, initial encounter: Secondary | ICD-10-CM

## 2015-06-06 DIAGNOSIS — R55 Syncope and collapse: Secondary | ICD-10-CM

## 2015-06-06 DIAGNOSIS — I6200 Nontraumatic subdural hemorrhage, unspecified: Secondary | ICD-10-CM | POA: Diagnosis not present

## 2015-06-06 DIAGNOSIS — G40909 Epilepsy, unspecified, not intractable, without status epilepticus: Secondary | ICD-10-CM

## 2015-06-06 DIAGNOSIS — S065XAA Traumatic subdural hemorrhage with loss of consciousness status unknown, initial encounter: Secondary | ICD-10-CM

## 2015-06-06 NOTE — Progress Notes (Addendum)
NEUROLOGY FOLLOW UP OFFICE NOTE  Justin Patel [id number]  HISTORY OF PRESENT ILLNESS: Justin Patel is a 64 year old right-handed man with type II diabetes, hypertension, status post gastric bypass, CAD status post catheterization and history of left TKR and GI bleed and post-traumatic subdural hematoma who follows up for recurrent episodes of transient altered awareness/possible seizure disorder.  Labs reviewed.  UPDATE: He takes Keppra 500mg  twice daily.  Last spell was 06/03/14.  A couple of months ago, he had an episode of falls.  He got up to go to the bathroom and the next thing he felt lightheaded and he was on the floor.  He tried getting up but felt lightheaded and fell back down.  He did not lose consciousness.  It all lasted a few seconds.  He gets ferritin infusions for iron deficient anemia.  Two weeks ago, Hgb/HCT was 11.6/35.  HISTORY: He was on lovenox following left total knee replacement on 05/14/14.  He passed out and fell on 06/03/14, hitting the back of his head.  He was waiting for the repairman.  The last thing he remembers was getting a call from the repairman saying he will be at the house in 10 minutes.  Then next thing he remembers was waking up in [facility].  Reportedly, when he answered the door, the repairman said he looked strange and his body became rigid and just fell backwards.  He was convulsing on the ground.  CT of the head showed a small subdural hematoma along the right interhemispheric fissure.  MRI of the brain showed post-traumatic shearing injury presenting as medial right frontal acute parenchymal hemorrhage with adjacent subdural and subarachnoid blood.  It also revealed associated ischemia in the right frontal subcortical white matter, due to injury.  2D echo LVEF 55-60%.  EEG was normal.  Anticoagulation was held.  CT of the head performed on 06/05/14 showed interval improvement in the bleed.  Due to possibility that these spells are symptomatic  complex partial seizures, he was started on Keppra 500mg  twice daily.    He had a 24 hour ambulatory EEG, which was normal but also did not capture one of his habitual spells.  Otherwise, he has been doing well.  He has had no recurrent spells.  He feels a little fatigued but is tolerating the Keppra.  Since 2010, he had episodes of bleed following gastric bypass surgery in which he had episodes of syncope.  One time, EMS arrived after he had passed out and fell in the bathtub.  He had voided his bowls.  EMS told him he had a seizure.  He also reports an episode of transient global amnesia in 2012.  PAST MEDICAL HISTORY: Past Medical History  Diagnosis Date  . Hypertension   . Anxiety   . Sleep apnea     hx not now since wt loss  . Diabetes mellitus   . Stones in the urinary tract   . GERD (gastroesophageal reflux disease)   . H/O hiatal hernia   . Headache(784.0)   . Arthritis   . 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 Gastric ulcer   . Degenerative disc disease   . Sacral fracture, closed ([place])   . Syncope and collapse   . Chronic kidney disease   . Heart murmur   . Hyperlipidemia   . Hx of congestive heart failure   . Iron deficiency anemia 02/28/2015    MEDICATIONS: Current Outpatient Prescriptions  on File Prior to Visit  Medication Sig Dispense Refill  . acetaminophen (TYLENOL) 500 MG tablet Take 1,000 mg by mouth every 6 (six) hours as needed.    Marland Kitchen amLODipine (NORVASC) 5 MG tablet take 1 tablet by mouth daily 90 tablet 3  . cyclobenzaprine (FLEXERIL) 5 MG tablet Take 1 tablet (5 mg total) by mouth 3 (three) times daily as needed for muscle spasms. 30 tablet 1  . DULoxetine (CYMBALTA) 60 MG capsule take 1 capsule by mouth once daily 30 capsule 3  . FLUARIX QUADRIVALENT 0.5 ML injection inject 0.5 milliliter intramuscularly  0  . furosemide (LASIX) 20 MG tablet Take 1 tablet (20 mg total) by mouth daily. 30 tablet 3  . hydrALAZINE (APRESOLINE) 50 MG tablet take  1 tablet by mouth four times a day 120 tablet 5  . levETIRAcetam (KEPPRA) 500 MG tablet take 1 tablet by mouth twice a day 60 tablet 5  . losartan (COZAAR) 100 MG tablet take 1 tablet by mouth once daily 30 tablet 5  . metoprolol succinate (TOPROL-XL) 50 MG 24 hr tablet Take 1 tablet (50 mg total) by mouth daily. 30 tablet 6  . NEXIUM 40 MG capsule take 1 capsule by mouth once daily 30 capsule 10  . PREVNAR 13 SUSP injection inject 0.5 milliliter intramuscularly  0  . rosuvastatin (CRESTOR) 10 MG tablet Take 1 tablet (10 mg total) by mouth daily. 30 tablet 5  . sucralfate (CARAFATE) 1 G tablet take 1 tablet by mouth four times a day 120 tablet 11  . tamsulosin (FLOMAX) 0.4 MG CAPS capsule Take 0.4 mg by mouth daily.    . traMADol (ULTRAM) 50 MG tablet Take 2 tablets (100 mg total) by mouth 3 (three) times daily as needed. 180 tablet 2  . VITAMIN D, CHOLECALCIFEROL, PO Take 1,000 mg by mouth daily.      No current facility-administered medications on file prior to visit.    ALLERGIES: Allergies  Allergen Reactions  . Altace [Ramipril] Anaphylaxis  . Levaquin [Levofloxacin In D5w] Hives  . Lyrica [Pregabalin]     Edema  . Metformin Other (See Comments) and Diarrhea    High doses cause diarrhea. High doses cause diarrhea.  . Iohexol Hives     Desc: HIVES     FAMILY HISTORY: Family History  Problem Relation Age of Onset  . Dementia Mother 83  . Heart disease Father 70  . Diabetes Father   . Lymphoma Sister     lymphoma, stage 4  . Ovarian cancer Sister 6    Ovarian  . Lupus Sister     SOCIAL HISTORY: Social History   Social History  . Marital Status: Married    Spouse Name: N/A  . Number of Children: N/A  . Years of Education: N/A   Occupational History  . Not on file.   Social History Main Topics  . Smoking status: Never Smoker   . Smokeless tobacco: Never Used  . Alcohol Use: 0.6 oz/week    1 Glasses of wine per week     Comment: occasional  . Drug Use: No    . Sexual Activity:    Partners: Female   Other Topics Concern  . Not on file   Social History Narrative   Lives in [place] with wife, [place]. 2 sons, Justin Patel, Justin Patel 51.. 4 grandchildren      Work - Retired, previously taught music in [place] - regular diet,  limited quantities after gastric bypass      Exercise - no regular, limited by arthritis in knees, occasional water aerobics    REVIEW OF SYSTEMS: Constitutional: No fevers, chills, or sweats, no generalized fatigue, change in appetite Eyes: No visual changes, double vision, eye pain Ear, nose and throat: No hearing loss, ear pain, nasal congestion, sore throat Cardiovascular: No chest pain, palpitations Respiratory:  No shortness of breath at rest or with exertion, wheezes GastrointestinaI: No nausea, vomiting, diarrhea, abdominal pain, fecal incontinence Genitourinary:  No dysuria, urinary retention or frequency Musculoskeletal:  No neck pain, back pain Integumentary: No rash, pruritus, skin lesions Neurological: as above Psychiatric: No depression, insomnia, anxiety Endocrine: No palpitations, fatigue, diaphoresis, mood swings, change in appetite, change in weight, increased thirst Hematologic/Lymphatic:  No anemia, purpura, petechiae. Allergic/Immunologic: no itchy/runny eyes, nasal congestion, recent allergic reactions, rashes  PHYSICAL EXAM: Filed Vitals:   06/06/15 1325  BP: 140/80  Pulse: 106   General: No acute distress.  Patient appears well-groomed.  Head:  Normocephalic/atraumatic Eyes:  Fundoscopic exam unremarkable without vessel changes, exudates, hemorrhages or papilledema. Neck: supple, no paraspinal tenderness, full range of motion Heart:  Regular rate and rhythm Lungs:  Clear to auscultation bilaterally Back: No paraspinal tenderness Neurological Exam: alert and oriented to person, place, and time. Attention span and concentration intact, recent and remote memory  intact, fund of knowledge intact.  Speech fluent and not dysarthric, language intact.  CN II-XII intact. Fundoscopic exam unremarkable without vessel changes, exudates, hemorrhages or papilledema.  Bulk and tone normal, muscle strength 5/5 throughout.  Sensation to temperature reduced in toes.  Sensation to vibration intact.  Deep tendon reflexes 2+ throughout, toes downgoing.  Finger to nose with postural and kinetic tremor.  Gait normal  IMPRESSION: 1.  Possible seizure disorder, as he has had two episodes of witnessed seizure, at least one of them unprovoked.  I don't think he had convulsive syncope.  With the spell from 2013, he had apparently been convulsing for an extended period of time since the first event was witnessed by EMS.  In regards to the second event in October, he appeared to be postictal since he does not remember anything until he was already in the ER. 2.  History of subdural hematoma 3.  Near-syncope. This has been an ongoing issue since his gastric bypass.  PLAN: Keppra 500mg  twice daily Follow up in one year.  26 minutes spent face to face with patient, over 50% spent discussing management.  Metta Clines, DO  CC:  Ronette Deter, MD

## 2015-06-06 NOTE — Patient Instructions (Signed)
Continue Keppra 500mg  twice daily Follow up in one year

## 2015-06-17 ENCOUNTER — Telehealth: Payer: Self-pay | Admitting: *Deleted

## 2015-06-17 ENCOUNTER — Ambulatory Visit: Payer: BC Managed Care – PPO | Attending: Pain Medicine | Admitting: Pain Medicine

## 2015-06-17 ENCOUNTER — Encounter: Payer: Self-pay | Admitting: Pain Medicine

## 2015-06-17 VITALS — BP 147/96 | HR 81 | Temp 98.9°F | Resp 16 | Ht 67.0 in | Wt 170.0 lb

## 2015-06-17 DIAGNOSIS — R569 Unspecified convulsions: Secondary | ICD-10-CM | POA: Diagnosis not present

## 2015-06-17 DIAGNOSIS — Q761 Klippel-Feil syndrome: Secondary | ICD-10-CM

## 2015-06-17 DIAGNOSIS — M79605 Pain in left leg: Secondary | ICD-10-CM | POA: Diagnosis present

## 2015-06-17 DIAGNOSIS — Z981 Arthrodesis status: Secondary | ICD-10-CM | POA: Insufficient documentation

## 2015-06-17 DIAGNOSIS — M2578 Osteophyte, vertebrae: Secondary | ICD-10-CM | POA: Insufficient documentation

## 2015-06-17 DIAGNOSIS — D649 Anemia, unspecified: Secondary | ICD-10-CM | POA: Diagnosis not present

## 2015-06-17 DIAGNOSIS — M5136 Other intervertebral disc degeneration, lumbar region: Secondary | ICD-10-CM

## 2015-06-17 DIAGNOSIS — K289 Gastrojejunal ulcer, unspecified as acute or chronic, without hemorrhage or perforation: Secondary | ICD-10-CM | POA: Diagnosis not present

## 2015-06-17 DIAGNOSIS — M47816 Spondylosis without myelopathy or radiculopathy, lumbar region: Secondary | ICD-10-CM

## 2015-06-17 DIAGNOSIS — M79604 Pain in right leg: Secondary | ICD-10-CM | POA: Diagnosis present

## 2015-06-17 DIAGNOSIS — M5416 Radiculopathy, lumbar region: Secondary | ICD-10-CM | POA: Insufficient documentation

## 2015-06-17 DIAGNOSIS — K219 Gastro-esophageal reflux disease without esophagitis: Secondary | ICD-10-CM | POA: Insufficient documentation

## 2015-06-17 DIAGNOSIS — Z9884 Bariatric surgery status: Secondary | ICD-10-CM | POA: Insufficient documentation

## 2015-06-17 DIAGNOSIS — I10 Essential (primary) hypertension: Secondary | ICD-10-CM | POA: Diagnosis not present

## 2015-06-17 DIAGNOSIS — M533 Sacrococcygeal disorders, not elsewhere classified: Secondary | ICD-10-CM | POA: Insufficient documentation

## 2015-06-17 DIAGNOSIS — M545 Low back pain: Secondary | ICD-10-CM | POA: Diagnosis present

## 2015-06-17 DIAGNOSIS — E1140 Type 2 diabetes mellitus with diabetic neuropathy, unspecified: Secondary | ICD-10-CM | POA: Insufficient documentation

## 2015-06-17 NOTE — Telephone Encounter (Signed)
Form was dropped off for Health Examination Certificate for the [facility] to be completed.  Form is completed and ready to be picked up.

## 2015-06-17 NOTE — Patient Instructions (Addendum)
Continue present medications  Lumbar facet, medial branch nerve, blocks to be performed at time return appointment  F/U PCP for evaliation of  BP and general medical  condition  F/U surgical evaluation. May consider pending follow-up evaluations  F/U neurological evaluation. May consider pending follow-up evaluations  May consider radiofrequency rhizolysis or intraspinal procedures pending response to present treatment and F/U evaluation   Patient to call Pain Management Center should patient have concerns prior to scheduled return appointment.      Pain Management Discharge Instructions  General Discharge Instructions :  If you need to reach your doctor call: Monday-Friday 8:00 am - 4:00 pm at [phone] or toll free [phone].  After clinic hours [phone] to have operator reach doctor.  Bring all of your medication bottles to all your appointments in the pain clinic.  To cancel or reschedule your appointment with Pain Management please remember to call 24 hours in advance to avoid a fee.  Refer to the educational materials which you have been given on: General Risks, I had my Procedure. Discharge Instructions, Post Sedation.  Post Procedure Instructions:  The drugs you were given will stay in your system until tomorrow, so for the next 24 hours you should not drive, make any legal decisions or drink any alcoholic beverages.  You may eat anything you prefer, but it is better to start with liquids then soups and crackers, and gradually work up to solid foods.  Please notify your doctor immediately if you have any unusual bleeding, trouble breathing or pain that is not related to your normal pain.  Depending on the type of procedure that was done, some parts of your body may feel week and/or numb.  This usually clears up by tonight or the next day.  Walk with the use of an assistive device or accompanied by an adult for the 24 hours.  You may use ice on the  affected area for the first 24 hours.  Put ice in a Ziploc bag and cover with a towel and place against area 15 minutes on 15 minutes off.  You may switch to heat after 24 hours.GENERAL RISKS AND COMPLICATIONS  What are the risk, side effects and possible complications? Generally speaking, most procedures are safe.  However, with any procedure there are risks, side effects, and the possibility of complications.  The risks and complications are dependent upon the sites that are lesioned, or the type of nerve block to be performed.  The closer the procedure is to the spine, the more serious the risks are.  Great care is taken when placing the radio frequency needles, block needles or lesioning probes, but sometimes complications can occur. 1. Infection: Any time there is an injection through the skin, there is a risk of infection.  This is why sterile conditions are used for these blocks.  There are four possible types of infection. 1. Localized skin infection. 2. Central Nervous System Infection-This can be in the form of Meningitis, which can be deadly. 3. Epidural Infections-This can be in the form of an epidural abscess, which can cause pressure inside of the spine, causing compression of the spinal cord with subsequent paralysis. This would require an emergency surgery to decompress, and there are no guarantees that the patient would recover from the paralysis. 4. Discitis-This is an infection of the intervertebral discs.  It occurs in about 1% of discography procedures.  It is difficult to treat and it may lead to surgery.  2. Pain: the needles have to go through skin and soft tissues, will cause soreness.       3. Damage to internal structures:  The nerves to be lesioned may be near blood vessels or    other nerves which can be potentially damaged.       4. Bleeding: Bleeding is more common if the patient is taking blood thinners such as  aspirin, Coumadin, Ticiid, Plavix, etc., or if he/she  have some genetic predisposition  such as hemophilia. Bleeding into the spinal canal can cause compression of the spinal  cord with subsequent paralysis.  This would require an emergency surgery to  decompress and there are no guarantees that the patient would recover from the  paralysis.       5. Pneumothorax:  Puncturing of a lung is a possibility, every time a needle is introduced in  the area of the chest or upper back.  Pneumothorax refers to free air around the  collapsed lung(s), inside of the thoracic cavity (chest cavity).  Another two possible  complications related to a similar event would include: Hemothorax and Chylothorax.   These are variations of the Pneumothorax, where instead of air around the collapsed  lung(s), you may have blood or chyle, respectively.       6. Spinal headaches: They may occur with any procedures in the area of the spine.       7. Persistent CSF (Cerebro-Spinal Fluid) leakage: This is a rare problem, but may occur  with prolonged intrathecal or epidural catheters either due to the formation of a fistulous  track or a dural tear.       8. Nerve damage: By working so close to the spinal cord, there is always a possibility of  nerve damage, which could be as serious as a permanent spinal cord injury with  paralysis.       9. Death:  Although rare, severe deadly allergic reactions known as "Anaphylactic  reaction" can occur to any of the medications used.      10. Worsening of the symptoms:  We can always make thing worse.  What are the chances of something like this happening? Chances of any of this occuring are extremely low.  By statistics, you have more of a chance of getting killed in a motor vehicle accident: while driving to the hospital than any of the above occurring .  Nevertheless, you should be aware that they are possibilities.  In general, it is similar to taking a shower.  Everybody knows that you can slip, hit your head and get killed.  Does that mean that  you should not shower again?  Nevertheless always keep in mind that statistics do not mean anything if you happen to be on the wrong side of them.  Even if a procedure has a 1 (one) in a 1,000,000 (million) chance of going wrong, it you happen to be that one..Also, keep in mind that by statistics, you have more of a chance of having something go wrong when taking medications.  Who should not have this procedure? If you are on a blood thinning medication (e.g. Coumadin, Plavix, see list of "Blood Thinners"), or if you have an active infection going on, you should not have the procedure.  If you are taking any blood thinners, please inform your physician.  How should I prepare for this procedure?  Do not eat or drink anything at least six hours prior to the procedure.  Bring a driver  with you .  It cannot be a taxi.  Come accompanied by an adult that can drive you back, and that is strong enough to help you if your legs get weak or numb from the local anesthetic.  Take all of your medicines the morning of the procedure with just enough water to swallow them.  If you have diabetes, make sure that you are scheduled to have your procedure done first thing in the morning, whenever possible.  If you have diabetes, take only half of your insulin dose and notify our nurse that you have done so as soon as you arrive at the clinic.  If you are diabetic, but only take blood sugar pills (oral hypoglycemic), then do not take them on the morning of your procedure.  You may take them after you have had the procedure.  Do not take aspirin or any aspirin-containing medications, at least eleven (11) days prior to the procedure.  They may prolong bleeding.  Wear loose fitting clothing that may be easy to take off and that you would not mind if it got stained with Betadine or blood.  Do not wear any jewelry or perfume  Remove any nail coloring.  It will interfere with some of our monitoring equipment.  NOTE:  Remember that this is not meant to be interpreted as a complete list of all possible complications.  Unforeseen problems may occur.  BLOOD THINNERS The following drugs contain aspirin or other products, which can cause increased bleeding during surgery and should not be taken for 2 weeks prior to and 1 week after surgery.  If you should need take something for relief of minor pain, you may take acetaminophen which is found in Tylenol,m Datril, Anacin-3 and Panadol. It is not blood thinner. The products listed below are.  Do not take any of the products listed below in addition to any listed on your instruction sheet.  A.P.C or A.P.C with Codeine Codeine Phosphate Capsules #3 Ibuprofen Ridaura  ABC compound Congesprin Imuran rimadil  Advil Cope Indocin Robaxisal  Alka-Seltzer Effervescent Pain Reliever and Antacid Coricidin or Coricidin-D  Indomethacin Rufen  Alka-Seltzer plus Cold Medicine Cosprin Ketoprofen S-A-C Tablets  Anacin Analgesic Tablets or Capsules Coumadin Korlgesic Salflex  Anacin Extra Strength Analgesic tablets or capsules CP-2 Tablets Lanoril Salicylate  Anaprox Cuprimine Capsules Levenox Salocol  Anexsia-D Dalteparin Magan Salsalate  Anodynos Darvon compound Magnesium Salicylate Sine-off  Ansaid Dasin Capsules Magsal Sodium Salicylate  Anturane Depen Capsules Marnal Soma  APF Arthritis pain formula Dewitt's Pills Measurin Stanback  Argesic Dia-Gesic Meclofenamic Sulfinpyrazone  Arthritis Bayer Timed Release Aspirin Diclofenac Meclomen Sulindac  Arthritis pain formula Anacin Dicumarol Medipren Supac  Analgesic (Safety coated) Arthralgen Diffunasal Mefanamic Suprofen  Arthritis Strength Bufferin Dihydrocodeine Mepro Compound Suprol  Arthropan liquid Dopirydamole Methcarbomol with Aspirin Synalgos  ASA tablets/Enseals Disalcid Micrainin Tagament  Ascriptin Doan's Midol Talwin  Ascriptin A/D Dolene Mobidin Tanderil  Ascriptin Extra Strength Dolobid Moblgesic Ticlid   Ascriptin with Codeine Doloprin or Doloprin with Codeine Momentum Tolectin  Asperbuf Duoprin Mono-gesic Trendar  Aspergum Duradyne Motrin or Motrin IB Triminicin  Aspirin plain, buffered or enteric coated Durasal Myochrisine Trigesic  Aspirin Suppositories Easprin Nalfon Trillsate  Aspirin with Codeine Ecotrin Regular or Extra Strength Naprosyn Uracel  Atromid-S Efficin Naproxen Ursinus  Auranofin Capsules Elmiron Neocylate Vanquish  Axotal Emagrin Norgesic Verin  Azathioprine Empirin or Empirin with Codeine Normiflo Vitamin E  Azolid Emprazil Nuprin Voltaren  Bayer Aspirin plain, buffered or children's or timed BC Tablets or powders  Encaprin Orgaran Warfarin Sodium  Buff-a-Comp Enoxaparin Orudis Zorpin  Buff-a-Comp with Codeine Equegesic Os-Cal-Gesic   Buffaprin Excedrin plain, buffered or Extra Strength Oxalid   Bufferin Arthritis Strength Feldene Oxphenbutazone   Bufferin plain or Extra Strength Feldene Capsules Oxycodone with Aspirin   Bufferin with Codeine Fenoprofen Fenoprofen Pabalate or Pabalate-SF   Buffets II Flogesic Panagesic   Buffinol plain or Extra Strength Florinal or Florinal with Codeine Panwarfarin   Buf-Tabs Flurbiprofen Penicillamine   Butalbital Compound Four-way cold tablets Penicillin   Butazolidin Fragmin Pepto-Bismol   Carbenicillin Geminisyn Percodan   Carna Arthritis Reliever Geopen Persantine   Carprofen Gold's salt Persistin   Chloramphenicol Goody's Phenylbutazone   Chloromycetin Haltrain Piroxlcam   Clmetidine heparin Plaquenil   Cllnoril Hyco-pap Ponstel   Clofibrate Hydroxy chloroquine Propoxyphen         Before stopping any of these medications, be sure to consult the physician who ordered them.  Some, such as Coumadin (Warfarin) are ordered to prevent or treat serious conditions such as "deep thrombosis", "pumonary embolisms", and other heart problems.  The amount of time that you may need off of the medication may also vary with the medication  and the reason for which you were taking it.  If you are taking any of these medications, please make sure you notify your pain physician before you undergo any procedures.         Facet Joint Block The facet joints connect the bones of the spine (vertebrae). They make it possible for you to bend, twist, and make other movements with your spine. They also prevent you from overbending, overtwisting, and making other excessive movements.  A facet joint block is a procedure where a numbing medicine (anesthetic) is injected into a facet joint. Often, a type of anti-inflammatory medicine called a steroid is also injected. A facet joint block may be done for two reasons:  2. Diagnosis. A facet joint block may be done as a test to see whether neck or back pain is caused by a worn-down or infected facet joint. If the pain gets better after a facet joint block, it means the pain is probably coming from the facet joint. If the pain does not get better, it means the pain is probably not coming from the facet joint.  3. Therapy. A facet joint block may be done to relieve neck or back pain caused by a facet joint. A facet joint block is only done as a therapy if the pain does not improve with medicine, exercise programs, physical therapy, and other forms of pain management. LET [facility] CARE PROVIDER KNOW ABOUT:   Any allergies you have.   All medicines you are taking, including vitamins, herbs, eyedrops, and over-the-counter medicines and creams.   Previous problems you or members of your family have had with the use of anesthetics.   Any blood disorders you have had.   Other health problems you have. RISKS AND COMPLICATIONS Generally, having a facet joint block is safe. However, as with any procedure, complications can occur. Possible complications associated with having a facet joint block include:   Bleeding.   Injury to a nerve near the injection site.   Pain at the injection site.    Weakness or numbness in areas controlled by nerves near the injection site.   Infection.   Temporary fluid retention.   Allergic reaction to anesthetics or medicines used during the procedure. BEFORE THE PROCEDURE   Follow your health care provider's instructions if you are  taking dietary supplements or medicines. You may need to stop taking them or reduce your dosage.   Do not take any new dietary supplements or medicines without asking your health care provider first.   Follow your health care provider's instructions about eating and drinking before the procedure. You may need to stop eating and drinking several hours before the procedure.   Arrange to have an adult drive you home after the procedure. PROCEDURE 12. You may need to remove your clothing and dress in an open-back gown so that your health care provider can access your spine.  13. The procedure will be done while you are lying on an X-ray table. Most of the time you will be asked to lie on your stomach, but you may be asked to lie in a different position if an injection will be made in your neck.  14. Special machines will be used to monitor your oxygen levels, heart rate, and blood pressure.  15. If an injection will be made in your neck, an intravenous (IV) tube will be inserted into one of your veins. Fluids and medicine will flow directly into your body through the IV tube.  16. The area over the facet joint where the injection will be made will be cleaned with an antiseptic soap. The surrounding skin will be covered with sterile drapes.  17. An anesthetic will be applied to your skin to make the injection area numb. You may feel a temporary stinging or burning sensation.  18. A video X-ray machine will be used to locate the joint. A contrast dye may be injected into the facet joint area to help with locating the joint.  19. When the joint is located, an anesthetic medicine will be injected into the joint  through the needle.  25. Your health care provider will ask you whether you feel pain relief. If you do feel relief, a steroid may be injected to provide pain relief for a longer period of time. If you do not feel relief or feel only partial relief, additional injections of an anesthetic may be made in other facet joints.  21. The needle will be removed, the skin will be cleansed, and bandages will be applied.  AFTER THE PROCEDURE   You will be observed for 15-30 minutes before being allowed to go home. Do not drive. Have an adult drive you or take a taxi or public transportation instead.   If you feel pain relief, the pain will return in several hours or days when the anesthetic wears off.   You may feel pain relief 2-14 days after the procedure. The amount of time this relief lasts varies from person to person.   It is normal to feel some tenderness over the injected area(s) for 2 days following the procedure.   If you have diabetes, you may have a temporary increase in blood sugar.   This information is not intended to replace advice given to you by your health care provider. Make sure you discuss any questions you have with your health care provider.   Document Released: 12/15/2006 Document Revised: 08/16/2014 Document Reviewed: 05/15/2012 Elsevier Interactive Patient Education [employer].

## 2015-06-17 NOTE — Progress Notes (Signed)
Subjective:    Patient ID: Justin Patel, male    DOB: May 12, 1951, 64 y.o.   MRN: [id number]  HPI The patient is 64 year old gentleman who comes to pain management Center at the request of Lyndon Code primary care provider for further evaluation and treatment of pain involving the lower back and lower extremity regions. The patient is a history of prior surgical intervention of the cervical region with cervical fusion performed by Dr. Elisabeth Most. At the present time patient states that his pain involves the mid and lower back regions with pain radiating to the lower extremities after prolonged standing and walking. The patient describes the pain is aching and gnawing burning constant sharp shooting sensations awakening patient from sleep at times. Patient stated the pain increased with twisting walking stooping kneeling lifting bending. Patient stated the pain has decreased with nerve blocks resting hot packs and medications at the present time patient has been instructed by his surgeon to return to pain management for further evaluation and treatment of lower back and lower activity pain. We will consider patient for interventional treatment consisting of lumbar facet, medial branch nerve, proximal attempt return appointment in attempt to decrease severity of patient's symptoms, minimize progression of symptoms, and avoid any more involved treatment. 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 There was a well-healed vertical surgical scar of the left anterior cervical region without increased warmth and erythema in the region of the scar. There is limited range of motion of the cervical spine with unremarkable Spurling's maneuver. There was mild tinnitus of the acromioclavicular and glenohumeral joint regions. Patient was a slightly decreased grip strength and Tinel and Phalen's maneuver without increase of pain of significant degree. There was mild just palpation over the upper thoracic paraspinal musculature region and moderate to palpation of the mid thoracic and lower thoracic paraspinal musculature region with moderate to severe muscle spasms. Palpation over the lumbar paraspinal muscles lumbar facet region was with moderate to moderately severe increased pain with lateral bending rotation extension and palpation of the lumbar facets reproducing severe discomfort. Straight leg raise was tolerates approximately 20 without increased pain with dorsiflexion noted. There was negative clonus negative Homans there was well-healed scar of the knee patient status post total knee replacement. There was crepitus of the right knee with negative anterior and posterior drawer signs without ballottement of the patella. EHL strength appeared to be decreased without a definite sensory deficit or dermatomal distribution detected. There was negative clonus negative Homans. There is mild tenderness of the PSIS and PI IS regions. There was negative clonus negative Homans. 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 Lateral recess narrowing at L3-4 through L4-5  Lumbar facet syndrome  Lumbar  radiculopathy  Sacroiliac joint dysfunction  Status post cervical fusion  Diabetes mellitus  Diabetic neuropathy     PLAN   Continue present medications  Lumbar facet, medial branch nerve, blocks to be performed at time return appointment  F/U PCP  Geraldine Solar for evaliation of  BP and general medical  condition  F/U surgical evaluation. Patient will follow-up with Dr.Botero as discussed  F/U neurological evaluation. May consider pending follow-up evaluations  May consider radiofrequency rhizolysis or intraspinal procedures pending response to present treatment and F/U evaluation   Patient to call Pain Management Center should patient have concerns prior to scheduled return appointment.

## 2015-06-17 NOTE — Progress Notes (Signed)
Safety precautions to be maintained throughout the outpatient stay will include: orient to surroundings, keep bed in low position, maintain call bell within reach at all times, provide assistance with transfer out of bed and ambulation.  

## 2015-06-18 NOTE — Telephone Encounter (Signed)
Advised pt that he will need to pick form up, I am unable to fax the form

## 2015-06-18 NOTE — Telephone Encounter (Signed)
Patient requested the form be faxed to the school, fax number is on the form. Patient requested a call back if form can not be faxed.

## 2015-06-19 ENCOUNTER — Telehealth: Payer: Self-pay | Admitting: Internal Medicine

## 2015-06-19 ENCOUNTER — Ambulatory Visit (INDEPENDENT_AMBULATORY_CARE_PROVIDER_SITE_OTHER): Payer: BC Managed Care – PPO | Admitting: Family Medicine

## 2015-06-19 ENCOUNTER — Emergency Department: Payer: BC Managed Care – PPO

## 2015-06-19 ENCOUNTER — Other Ambulatory Visit: Payer: Self-pay

## 2015-06-19 ENCOUNTER — Encounter: Payer: Self-pay | Admitting: Family Medicine

## 2015-06-19 ENCOUNTER — Observation Stay
Admission: EM | Admit: 2015-06-19 | Discharge: 2015-06-21 | Disposition: A | Discharge status: Home / Self Care | Payer: BC Managed Care – PPO | Attending: Specialist | Admitting: Specialist

## 2015-06-19 VITALS — BP 108/62 | HR 60 | Temp 93.9°F | Wt 180.0 lb

## 2015-06-19 DIAGNOSIS — Z87898 Personal history of other specified conditions: Secondary | ICD-10-CM | POA: Diagnosis present

## 2015-06-19 DIAGNOSIS — Z79899 Other long term (current) drug therapy: Secondary | ICD-10-CM | POA: Diagnosis not present

## 2015-06-19 DIAGNOSIS — R531 Weakness: Secondary | ICD-10-CM | POA: Diagnosis not present

## 2015-06-19 DIAGNOSIS — F05 Delirium due to known physiological condition: Secondary | ICD-10-CM | POA: Diagnosis not present

## 2015-06-19 DIAGNOSIS — N178 Other acute kidney failure: Secondary | ICD-10-CM | POA: Diagnosis not present

## 2015-06-19 DIAGNOSIS — F419 Anxiety disorder, unspecified: Secondary | ICD-10-CM | POA: Insufficient documentation

## 2015-06-19 DIAGNOSIS — Z9889 Other specified postprocedural states: Secondary | ICD-10-CM | POA: Diagnosis not present

## 2015-06-19 DIAGNOSIS — M1990 Unspecified osteoarthritis, unspecified site: Secondary | ICD-10-CM | POA: Diagnosis not present

## 2015-06-19 DIAGNOSIS — Z6826 Body mass index (BMI) 26.0-26.9, adult: Secondary | ICD-10-CM | POA: Diagnosis not present

## 2015-06-19 DIAGNOSIS — Z9884 Bariatric surgery status: Secondary | ICD-10-CM | POA: Diagnosis not present

## 2015-06-19 DIAGNOSIS — D509 Iron deficiency anemia, unspecified: Secondary | ICD-10-CM | POA: Diagnosis not present

## 2015-06-19 DIAGNOSIS — K922 Gastrointestinal hemorrhage, unspecified: Secondary | ICD-10-CM | POA: Insufficient documentation

## 2015-06-19 DIAGNOSIS — G894 Chronic pain syndrome: Secondary | ICD-10-CM | POA: Insufficient documentation

## 2015-06-19 DIAGNOSIS — E785 Hyperlipidemia, unspecified: Secondary | ICD-10-CM | POA: Diagnosis not present

## 2015-06-19 DIAGNOSIS — R55 Syncope and collapse: Secondary | ICD-10-CM | POA: Diagnosis not present

## 2015-06-19 DIAGNOSIS — Z87442 Personal history of urinary calculi: Secondary | ICD-10-CM | POA: Insufficient documentation

## 2015-06-19 DIAGNOSIS — K219 Gastro-esophageal reflux disease without esophagitis: Secondary | ICD-10-CM | POA: Diagnosis not present

## 2015-06-19 DIAGNOSIS — N179 Acute kidney failure, unspecified: Secondary | ICD-10-CM

## 2015-06-19 DIAGNOSIS — E1122 Type 2 diabetes mellitus with diabetic chronic kidney disease: Secondary | ICD-10-CM | POA: Diagnosis not present

## 2015-06-19 DIAGNOSIS — Z888 Allergy status to other drugs, medicaments and biological substances status: Secondary | ICD-10-CM | POA: Diagnosis not present

## 2015-06-19 DIAGNOSIS — F1110 Opioid abuse, uncomplicated: Secondary | ICD-10-CM | POA: Insufficient documentation

## 2015-06-19 DIAGNOSIS — Z8711 Personal history of peptic ulcer disease: Secondary | ICD-10-CM | POA: Insufficient documentation

## 2015-06-19 DIAGNOSIS — N189 Chronic kidney disease, unspecified: Secondary | ICD-10-CM | POA: Insufficient documentation

## 2015-06-19 DIAGNOSIS — E860 Dehydration: Secondary | ICD-10-CM | POA: Insufficient documentation

## 2015-06-19 DIAGNOSIS — F1010 Alcohol abuse, uncomplicated: Secondary | ICD-10-CM | POA: Diagnosis present

## 2015-06-19 DIAGNOSIS — K5900 Constipation, unspecified: Secondary | ICD-10-CM | POA: Insufficient documentation

## 2015-06-19 DIAGNOSIS — R3911 Hesitancy of micturition: Secondary | ICD-10-CM | POA: Diagnosis not present

## 2015-06-19 DIAGNOSIS — E559 Vitamin D deficiency, unspecified: Secondary | ICD-10-CM | POA: Diagnosis not present

## 2015-06-19 DIAGNOSIS — R112 Nausea with vomiting, unspecified: Secondary | ICD-10-CM | POA: Diagnosis not present

## 2015-06-19 DIAGNOSIS — N281 Cyst of kidney, acquired: Secondary | ICD-10-CM | POA: Insufficient documentation

## 2015-06-19 DIAGNOSIS — M25562 Pain in left knee: Secondary | ICD-10-CM | POA: Insufficient documentation

## 2015-06-19 DIAGNOSIS — I130 Hypertensive heart and chronic kidney disease with heart failure and stage 1 through stage 4 chronic kidney disease, or unspecified chronic kidney disease: Secondary | ICD-10-CM | POA: Diagnosis not present

## 2015-06-19 DIAGNOSIS — E669 Obesity, unspecified: Secondary | ICD-10-CM | POA: Insufficient documentation

## 2015-06-19 DIAGNOSIS — N401 Enlarged prostate with lower urinary tract symptoms: Secondary | ICD-10-CM | POA: Insufficient documentation

## 2015-06-19 DIAGNOSIS — I509 Heart failure, unspecified: Secondary | ICD-10-CM | POA: Insufficient documentation

## 2015-06-19 DIAGNOSIS — R011 Cardiac murmur, unspecified: Secondary | ICD-10-CM | POA: Insufficient documentation

## 2015-06-19 DIAGNOSIS — R569 Unspecified convulsions: Secondary | ICD-10-CM | POA: Diagnosis not present

## 2015-06-19 DIAGNOSIS — R338 Other retention of urine: Secondary | ICD-10-CM | POA: Insufficient documentation

## 2015-06-19 DIAGNOSIS — G4733 Obstructive sleep apnea (adult) (pediatric): Secondary | ICD-10-CM | POA: Insufficient documentation

## 2015-06-19 LAB — CBC WITH DIFFERENTIAL/PLATELET
Basophils Absolute: 0 10*3/uL (ref 0–0.1)
Basophils Relative: 0 %
EOS ABS: 0.1 10*3/uL (ref 0–0.7)
EOS PCT: 1 %
HCT: 33.5 % — ABNORMAL LOW (ref 40.0–52.0)
HEMOGLOBIN: 10.7 g/dL — AB (ref 13.0–18.0)
LYMPHS ABS: 0.7 10*3/uL — AB (ref 1.0–3.6)
LYMPHS PCT: 9 %
MCH: 32.2 pg (ref 26.0–34.0)
MCHC: 32 g/dL (ref 32.0–36.0)
MCV: 100.7 fL — AB (ref 80.0–100.0)
MONOS PCT: 7 %
Monocytes Absolute: 0.6 10*3/uL (ref 0.2–1.0)
Neutro Abs: 6.4 10*3/uL (ref 1.4–6.5)
Neutrophils Relative %: 83 %
PLATELETS: 163 10*3/uL (ref 150–440)
RBC: 3.33 MIL/uL — ABNORMAL LOW (ref 4.40–5.90)
RDW: 15.2 % — ABNORMAL HIGH (ref 11.5–14.5)
WBC: 7.8 10*3/uL (ref 3.8–10.6)

## 2015-06-19 LAB — COMPREHENSIVE METABOLIC PANEL
ALBUMIN: 3.9 g/dL (ref 3.5–5.0)
ALT: 20 U/L (ref 17–63)
AST: 21 U/L (ref 15–41)
Alkaline Phosphatase: 68 U/L (ref 38–126)
Anion gap: 6 (ref 5–15)
BILIRUBIN TOTAL: 0.5 mg/dL (ref 0.3–1.2)
BUN: 28 mg/dL — AB (ref 6–20)
CHLORIDE: 106 mmol/L (ref 101–111)
CO2: 23 mmol/L (ref 22–32)
CREATININE: 2.33 mg/dL — AB (ref 0.61–1.24)
Calcium: 9 mg/dL (ref 8.9–10.3)
GFR calc Af Amer: 32 mL/min — ABNORMAL LOW (ref >60)
GFR, EST NON AFRICAN AMERICAN: 28 mL/min — AB (ref >60)
GLUCOSE: 116 mg/dL — AB (ref 65–99)
POTASSIUM: 4.2 mmol/L (ref 3.5–5.1)
Sodium: 135 mmol/L (ref 135–145)
TOTAL PROTEIN: 6.7 g/dL (ref 6.5–8.1)

## 2015-06-19 LAB — URINALYSIS COMPLETE WITH MICROSCOPIC (ARMC ONLY)
Glucose, UA: NEGATIVE mg/dL
HGB URINE DIPSTICK: NEGATIVE
Leukocytes, UA: NEGATIVE
Nitrite: NEGATIVE
PH: 5 (ref 5.0–8.0)
Protein, ur: 30 mg/dL — AB
SQUAMOUS EPITHELIAL / LPF: NONE SEEN
Specific Gravity, Urine: 1.023 (ref 1.005–1.030)

## 2015-06-19 MED ORDER — SODIUM CHLORIDE 0.9 % IV BOLUS (SEPSIS)
1000.0000 mL | Freq: Once | INTRAVENOUS | Status: AC
  Administered 2015-06-19: 1000 mL via INTRAVENOUS

## 2015-06-19 MED ORDER — TAMSULOSIN HCL 0.4 MG PO CAPS
0.4000 mg | ORAL_CAPSULE | Freq: Every day | ORAL | Status: DC
  Administered 2015-06-20 – 2015-06-21 (×2): 0.4 mg via ORAL
  Filled 2015-06-19 (×2): qty 1

## 2015-06-19 MED ORDER — LEVETIRACETAM 500 MG PO TABS
500.0000 mg | ORAL_TABLET | Freq: Two times a day (BID) | ORAL | Status: DC
  Administered 2015-06-19 – 2015-06-21 (×4): 500 mg via ORAL
  Filled 2015-06-19 (×4): qty 1

## 2015-06-19 MED ORDER — HYDRALAZINE HCL 50 MG PO TABS
50.0000 mg | ORAL_TABLET | Freq: Four times a day (QID) | ORAL | Status: DC
  Administered 2015-06-20 – 2015-06-21 (×5): 50 mg via ORAL
  Filled 2015-06-19 (×5): qty 1

## 2015-06-19 MED ORDER — SODIUM CHLORIDE 0.9 % IV SOLN
INTRAVENOUS | Status: DC
  Administered 2015-06-19 – 2015-06-20 (×2): via INTRAVENOUS

## 2015-06-19 MED ORDER — LOSARTAN POTASSIUM 50 MG PO TABS
100.0000 mg | ORAL_TABLET | Freq: Every day | ORAL | Status: DC
  Administered 2015-06-20 – 2015-06-21 (×2): 100 mg via ORAL
  Filled 2015-06-19 (×2): qty 2

## 2015-06-19 MED ORDER — SUCRALFATE 1 G PO TABS
1.0000 g | ORAL_TABLET | Freq: Three times a day (TID) | ORAL | Status: DC
  Administered 2015-06-19 – 2015-06-21 (×6): 1 g via ORAL
  Filled 2015-06-19 (×6): qty 1

## 2015-06-19 MED ORDER — ONDANSETRON HCL 4 MG PO TABS: 4.0000 mg | ORAL_TABLET | Freq: Four times a day (QID) | ORAL | Status: DC | PRN

## 2015-06-19 MED ORDER — ACETAMINOPHEN 650 MG RE SUPP: 650.0000 mg | Freq: Four times a day (QID) | RECTAL | Status: DC | PRN

## 2015-06-19 MED ORDER — ACETAMINOPHEN 325 MG PO TABS: 650.0000 mg | ORAL_TABLET | Freq: Four times a day (QID) | ORAL | Status: DC | PRN

## 2015-06-19 MED ORDER — PANTOPRAZOLE SODIUM 40 MG PO TBEC
40.0000 mg | DELAYED_RELEASE_TABLET | Freq: Every day | ORAL | Status: DC
  Administered 2015-06-20 – 2015-06-21 (×2): 40 mg via ORAL
  Filled 2015-06-19 (×2): qty 1

## 2015-06-19 MED ORDER — VITAMIN D 1000 UNITS PO TABS
1000.0000 [IU] | ORAL_TABLET | Freq: Every day | ORAL | Status: DC
  Administered 2015-06-20 – 2015-06-21 (×2): 1000 [IU] via ORAL
  Filled 2015-06-19 (×2): qty 1

## 2015-06-19 MED ORDER — SODIUM CHLORIDE 0.9 % IV BOLUS (SEPSIS)
500.0000 mL | Freq: Once | INTRAVENOUS | Status: AC
  Administered 2015-06-19: 500 mL via INTRAVENOUS

## 2015-06-19 MED ORDER — CYCLOBENZAPRINE HCL 10 MG PO TABS: 5.0000 mg | ORAL_TABLET | Freq: Three times a day (TID) | ORAL | Status: DC | PRN

## 2015-06-19 MED ORDER — DULOXETINE HCL 60 MG PO CPEP
60.0000 mg | ORAL_CAPSULE | Freq: Every day | ORAL | Status: DC
  Administered 2015-06-20 – 2015-06-21 (×2): 60 mg via ORAL
  Filled 2015-06-19 (×2): qty 1

## 2015-06-19 MED ORDER — LACTULOSE 10 GM/15ML PO SOLN
30.0000 g | Freq: Two times a day (BID) | ORAL | Status: DC | PRN
  Administered 2015-06-19: 30 g via ORAL
  Filled 2015-06-19: qty 60

## 2015-06-19 MED ORDER — TRAMADOL HCL 50 MG PO TABS
100.0000 mg | ORAL_TABLET | Freq: Four times a day (QID) | ORAL | Status: DC | PRN
  Administered 2015-06-19 – 2015-06-21 (×6): 100 mg via ORAL
  Filled 2015-06-19 (×6): qty 2

## 2015-06-19 MED ORDER — ROSUVASTATIN CALCIUM 10 MG PO TABS
10.0000 mg | ORAL_TABLET | Freq: Every day | ORAL | Status: DC
  Administered 2015-06-19 – 2015-06-21 (×3): 10 mg via ORAL
  Filled 2015-06-19 (×3): qty 1

## 2015-06-19 MED ORDER — METOPROLOL SUCCINATE ER 50 MG PO TB24
50.0000 mg | ORAL_TABLET | Freq: Every day | ORAL | Status: DC
  Administered 2015-06-20 – 2015-06-21 (×2): 50 mg via ORAL
  Filled 2015-06-19 (×2): qty 1

## 2015-06-19 MED ORDER — ONDANSETRON HCL 4 MG/2ML IJ SOLN
4.0000 mg | Freq: Four times a day (QID) | INTRAMUSCULAR | Status: DC | PRN
  Administered 2015-06-19: 4 mg via INTRAVENOUS
  Filled 2015-06-19: qty 2

## 2015-06-19 MED ORDER — AMLODIPINE BESYLATE 5 MG PO TABS
5.0000 mg | ORAL_TABLET | Freq: Every day | ORAL | Status: DC
  Administered 2015-06-20 – 2015-06-21 (×2): 5 mg via ORAL
  Filled 2015-06-19 (×2): qty 1

## 2015-06-19 MED ORDER — HEPARIN SODIUM (PORCINE) 5000 UNIT/ML IJ SOLN
5000.0000 [IU] | Freq: Three times a day (TID) | INTRAMUSCULAR | Status: DC
  Administered 2015-06-19 – 2015-06-21 (×5): 5000 [IU] via SUBCUTANEOUS
  Filled 2015-06-19 (×5): qty 1

## 2015-06-19 NOTE — Telephone Encounter (Signed)
Pt appt is scheduled. Thank you!

## 2015-06-19 NOTE — ED Notes (Signed)
Patient states fatigue, weakness . States he has not voided for last 3 days or had a BM for last 3 days.

## 2015-06-19 NOTE — Telephone Encounter (Signed)
Please scheduled then for appt. Thanks

## 2015-06-19 NOTE — Assessment & Plan Note (Signed)
Patient comes in the office today with complaint of urinary retention. He has a number of other complaints most concerning of which are confusion, back pain, urinary and stool retention. He is hypothermic in the office with a oral temperature 92.9 Fahrenheit and an axillary temperature 93.9 Fahrenheit. Suspect the patient is septic at this time. Question is what is the source of his infection. Given his temperature and overall appearance he needs emergent evaluation in the ED for this. We'll defer workup and further evaluation to the ED physician. EMS was called to transport the patient to the ED.

## 2015-06-19 NOTE — ED Provider Notes (Signed)
[facility] Emergency Department Provider Note   ____________________________________________  Time seen: 1640  I have reviewed the triage vital signs and the nursing notes.   HISTORY  Chief Complaint Weakness   History limited by: Not Limited   HPI Justin Patel is a 64 y.o. male who presents to the emergency department today because of concerns for decreased urination, decreased bowel movements and weakness. The patient states that he has not had a bowel movement for 3 days. He states he usually has a normal bowel movement every day. He denies any associated abdominal pain with this. He denied any nausea until today when he actually did develop some nausea and had one episode of vomiting. The patient states that he has not noticed any bloody stool or emesis. He also states he has not urinated for the past 3 days. He states he does have a history of an enlarged prostate but has not even had the urge to urinate. He furthermore states he has been having increased weakness over the past 3 days. He denies any fevers, chest pain or shortness breath.   Past Medical History  Diagnosis Date  . Hypertension   . Anxiety   . Sleep apnea     hx not now since wt loss  . Diabetes mellitus   . Stones in the urinary tract   . GERD (gastroesophageal reflux disease)   . H/O hiatal hernia   . Headache(784.0)   . Arthritis   . 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 Gastric ulcer   . Degenerative disc disease   . Sacral fracture, closed ([place])   . Syncope and collapse   . Chronic kidney disease   . Heart murmur   . Hyperlipidemia   . Hx of congestive heart failure   . Iron deficiency anemia 02/28/2015    Patient Active Problem List   Diagnosis Date Noted  . Acute confusional state 06/19/2015  . Chronic pain syndrome 04/16/2015  . Iron deficiency anemia 02/28/2015  . Leg edema, right 11/12/2014  . Alcohol abuse 10/01/2014  . Intracerebral  hemorrhage ([place]) 06/04/2014  . Brain bleed ([place]) 06/04/2014  . SDH (subdural hematoma) ([place]) 06/03/2014  . Syncope 06/03/2014  . Fall 06/03/2014  . 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 Left knee pain 02/04/2014  . Gonalgia 02/04/2014  . Benign prostatic hyperplasia with urinary obstruction 11/16/2013  . Enlarged prostate 11/08/2013  . Anemia 11/08/2013  . Hypovitaminosis D 10/11/2013  . Fracture of metatarsal bone of right foot 10/11/2013  . Fracture of metatarsal 10/11/2013  . Avitaminosis D 10/11/2013  . H/O urinary stone 10/05/2013  . Urinary hesitancy 08/27/2013  . Renal cyst 08/27/2013  . Upper GI bleed 08/20/2013  . Gastrointestinal bleeding, upper 08/20/2013  . Essential hypertension, benign 03/21/2013  . Other and unspecified hyperlipidemia 03/21/2013  . Obesity, unspecified 03/21/2013  . Adiposity 03/21/2013  . Benign essential HTN 03/21/2013  . HLD (hyperlipidemia) 03/21/2013    Past Surgical History  Procedure Laterality Date  . Hernia repair  2000    hiatal  . Gastric bypass  2010    [place]  . Knee arthroscopy      bilateral, left x 2  . Osteotomy  2001    left  . Tonsillectomy    . Appendectomy    . Nasal sinus surgery      x5  . Uvulopalatoplasty  2011  . Anterior  cervical decomp/discectomy fusion  01/26/2012    Procedure: ANTERIOR CERVICAL DECOMPRESSION/DISCECTOMY FUSION 3 LEVELS;  Surgeon: Floyce Stakes, MD;  Location: [facility];  Service: Neurosurgery;  Laterality: N/A;  Cervical three-four Cervical four-five Cervical five-six Cervical six-seven , Anterior cervical decompression/diskectomy, fusion, plate  . Total knee arthroplasty Left 05/2014    Dr. Marry Guan  . Cardiac catheterization      [facility], normal  . Cardiac catheterization      [place]  . Joint replacement      Current Outpatient Rx  Name  Route  Sig  Dispense  Refill  . acetaminophen (TYLENOL) 500 MG  tablet   Oral   Take 1,000 mg by mouth every 6 (six) hours as needed.         Marland Kitchen amLODipine (NORVASC) 5 MG tablet      take 1 tablet by mouth daily   90 tablet   3   . cyclobenzaprine (FLEXERIL) 5 MG tablet   Oral   Take 1 tablet (5 mg total) by mouth 3 (three) times daily as needed for muscle spasms.   30 tablet   1   . DULoxetine (CYMBALTA) 60 MG capsule      take 1 capsule by mouth once daily   30 capsule   3   . furosemide (LASIX) 20 MG tablet   Oral   Take 1 tablet (20 mg total) by mouth daily.   30 tablet   3   . hydrALAZINE (APRESOLINE) 50 MG tablet      take 1 tablet by mouth four times a day   120 tablet   5   . levETIRAcetam (KEPPRA) 500 MG tablet      take 1 tablet by mouth twice a day   60 tablet   5   . losartan (COZAAR) 100 MG tablet      take 1 tablet by mouth once daily   30 tablet   5   . metoprolol succinate (TOPROL-XL) 50 MG 24 hr tablet   Oral   Take 1 tablet (50 mg total) by mouth daily.   30 tablet   6   . NEXIUM 40 MG capsule      take 1 capsule by mouth once daily   30 capsule   10   . rosuvastatin (CRESTOR) 10 MG tablet   Oral   Take 1 tablet (10 mg total) by mouth daily.   30 tablet   5   . sucralfate (CARAFATE) 1 G tablet      take 1 tablet by mouth four times a day   120 tablet   11   . tamsulosin (FLOMAX) 0.4 MG CAPS capsule   Oral   Take 0.4 mg by mouth daily.         . traMADol (ULTRAM) 50 MG tablet   Oral   Take 2 tablets (100 mg total) by mouth 3 (three) times daily as needed.   180 tablet   2   . VITAMIN D, CHOLECALCIFEROL, PO   Oral   Take 1,000 mg by mouth daily.            Allergies Altace; Levaquin; Lyrica; Metformin; and Iohexol  Family History  Problem Relation Age of Onset  . Dementia Mother 59  . Heart disease Father 73  . Diabetes Father   . Lymphoma Sister     lymphoma, stage 4  . Ovarian cancer Sister 65    Ovarian  . Lupus Sister  Social History Social History   Substance Use Topics  . Smoking status: Never Smoker   . Smokeless tobacco: Never Used  . Alcohol Use: 0.6 oz/week    1 Glasses of wine per week     Comment: occasional    Review of Systems  Constitutional: Negative for fever. Cardiovascular: Negative for chest pain. Respiratory: Negative for shortness of breath. Gastrointestinal: Negative for abdominal pain, vomiting and diarrhea. Genitourinary: Decreased urination Musculoskeletal: Negative for back pain. Skin: Negative for rash. Neurological: Negative for headaches, focal weakness or numbness.  10-point ROS otherwise negative.  ____________________________________________   PHYSICAL EXAM:  VITAL SIGNS: ED Triage Vitals  Enc Vitals Group     BP 06/19/15 1516 94/56 mmHg     Pulse Rate 06/19/15 1516 65     Resp 06/19/15 1516 18     Temp 06/19/15 1516 95.1 F (35.1 C)     Temp Source 06/19/15 1516 Rectal     SpO2 06/19/15 1516 98 %     Weight 06/19/15 1516 180 lb (81.647 kg)     Height 06/19/15 1516 5\' 9"  (1.753 m)     Head Cir --      Peak Flow --      Pain Score 06/19/15 1518 0   Constitutional: Alert and oriented. Well appearing and in no distress. Eyes: Conjunctivae are normal. PERRL. Normal extraocular movements. ENT   Head: Normocephalic and atraumatic.   Nose: No congestion/rhinnorhea.   Mouth/Throat: Mucous membranes are moist.   Neck: No stridor. Hematological/Lymphatic/Immunilogical: No cervical lymphadenopathy. Cardiovascular: Normal rate, regular rhythm.  No murmurs, rubs, or gallops. Respiratory: Normal respiratory effort without tachypnea nor retractions. Breath sounds are clear and equal bilaterally. No wheezes/rales/rhonchi. Gastrointestinal: Soft and nontender. No distention.  Genitourinary: Deferred Musculoskeletal: Normal range of motion in all extremities. No joint effusions.  No lower extremity tenderness nor edema. Neurologic:  Normal speech and language. No gross focal  neurologic deficits are appreciated.  Skin:  Skin is warm, dry and intact. No rash noted. Psychiatric: Mood and affect are normal. Speech and behavior are normal. Patient exhibits appropriate insight and judgment.  ____________________________________________    LABS (pertinent positives/negatives)  Labs Reviewed  CBC WITH DIFFERENTIAL/PLATELET - Abnormal; Notable for the following:    RBC 3.33 (*)    Hemoglobin 10.7 (*)    HCT 33.5 (*)    MCV 100.7 (*)    RDW 15.2 (*)    Lymphs Abs 0.7 (*)    All other components within normal limits  COMPREHENSIVE METABOLIC PANEL - Abnormal; Notable for the following:    Glucose, Bld 116 (*)    BUN 28 (*)    Creatinine, Ser 2.33 (*)    GFR calc non Af Amer 28 (*)    GFR calc Af Amer 32 (*)    All other components within normal limits  URINALYSIS COMPLETEWITH MICROSCOPIC ([facility] ONLY) - Abnormal; Notable for the following:    Color, Urine AMBER (*)    APPearance CLEAR (*)    Bilirubin Urine 1+ (*)    Ketones, ur TRACE (*)    Protein, ur 30 (*)    Bacteria, UA RARE (*)    All other components within normal limits     ____________________________________________   EKG  I, Nance Pear, attending physician, personally viewed and interpreted this EKG  EKG Time: 1554 Rate: 61 Rhythm: normal sinus rhythm Axis: normal Intervals: qtc 450 QRS: narrow ST changes: no st elevation Impression: normal ekg ____________________________________________    RADIOLOGY  Abd x-rays IMPRESSION: Moderate stool throughout colon. Bowel gas pattern unremarkable. No obstruction or free air. No obvious urinary bladder distention. No lung edema or consolidation.   ____________________________________________   PROCEDURES  Procedure(s) performed: None  Critical Care performed: No  ____________________________________________   INITIAL IMPRESSION / ASSESSMENT AND PLAN / ED COURSE  Pertinent labs & imaging results that were available  during my care of the patient were reviewed by me and considered in my medical decision making (see chart for details).  Patient presents to the emergency department today because of concerns for decreased bowel movements, urination and weakness over the past 3 days. No overly concerning findings on physical exam except for some mild hypotension. Blood work was notable for elevated creatinine concerning for acute kidney injury. Will plan on IV fluids and admission for further management.  ____________________________________________   FINAL CLINICAL IMPRESSION(S) / ED DIAGNOSES  Final diagnoses:  AKI (acute kidney injury) ([place])     Nance Pear, MD 06/19/15 1752

## 2015-06-19 NOTE — H&P (Signed)
[facility] [place] NAME: Justin Patel    MR#:  [id number]  DATE OF BIRTH:  1951-01-31  DATE OF ADMISSION:  06/19/2015  PRIMARY CARE PHYSICIAN: Rica Mast, MD   REQUESTING/REFERRING PHYSICIAN: Dr. Charolett Bumpers  CHIEF COMPLAINT:   Chief Complaint  Patient presents with  . Weakness    HISTORY OF PRESENT ILLNESS:  Justin Patel  is a 64 y.o. male with a known history of hypertension, obstructive sleep apnea, anxiety, degenerative disc disease, history of CHF, history of hiatal hernia, GERD, BPH who presents to the hospital due to generalized weakness and not having had a bowel movement or urinated about 3 days. Patient says that he is normally constipated but usually this resolves on its own, although over the past few days patient has not had a bowel movement or urinated. He also has become more and more weak and therefore came to the ER for further evaluation. In the emergency room patient was noted to be in acute renal failure with a creatinine of 2.2 and his baseline being normal. Hospitalist services were contacted further treatment and evaluation. Patient says that over the past few days his appetite has been poor and he is also had an episode of nausea and vomiting past couple days. He also says that he does not drink enough water and usually drinks caffeinated beverages like diet soda and coffee. He denies any fevers, chills, abdominal pain, diarrhea, hematuria, hematemesis, hematochezia or melena.  PAST MEDICAL HISTORY:   Past Medical History  Diagnosis Date  . Hypertension   . Anxiety   . Sleep apnea     hx not now since wt loss  . Diabetes mellitus   . Stones in the urinary tract   . GERD (gastroesophageal reflux disease)   . H/O hiatal hernia   . Headache(784.0)   . Arthritis   . 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 Gastric ulcer   . Degenerative disc disease   . Sacral fracture, closed  ([place])   . Syncope and collapse   . Chronic kidney disease   . Heart murmur   . Hyperlipidemia   . Hx of congestive heart failure   . Iron deficiency anemia 02/28/2015    PAST SURGICAL HISTORY:   Past Surgical History  Procedure Laterality Date  . Hernia repair  2000    hiatal  . Gastric bypass  2010    [place]  . Knee arthroscopy      bilateral, left x 2  . Osteotomy  2001    left  . Tonsillectomy    . Appendectomy    . Nasal sinus surgery      x5  . Uvulopalatoplasty  2011  . Anterior cervical decomp/discectomy fusion  01/26/2012    Procedure: ANTERIOR CERVICAL DECOMPRESSION/DISCECTOMY FUSION 3 LEVELS;  Surgeon: Floyce Stakes, MD;  Location: [facility];  Service: Neurosurgery;  Laterality: N/A;  Cervical three-four Cervical four-five Cervical five-six Cervical six-seven , Anterior cervical decompression/diskectomy, fusion, plate  . Total knee arthroplasty Left 05/2014    Dr. Marry Guan  . Cardiac catheterization      [facility], normal  . Cardiac catheterization      [place]  . Joint replacement      SOCIAL HISTORY:   Social History  Substance Use Topics  . Smoking status: Never Smoker   . Smokeless tobacco: Never Used  . Alcohol Use: 0.6 oz/week  1 Glasses of wine per week     Comment: occasional    FAMILY HISTORY:   Family History  Problem Relation Age of Onset  . Dementia Mother 29  . Heart disease Father 4  . Diabetes Father   . Lymphoma Sister     lymphoma, stage 4  . Ovarian cancer Sister 83    Ovarian  . Lupus Sister     DRUG ALLERGIES:   Allergies  Allergen Reactions  . Altace [Ramipril] Anaphylaxis  . Levaquin [Levofloxacin In D5w] Hives  . Lyrica [Pregabalin]     Edema  . Metformin Other (See Comments) and Diarrhea    High doses cause diarrhea. High doses cause diarrhea.  . Iohexol Hives     Desc: HIVES     REVIEW OF SYSTEMS:   Review of Systems  Constitutional: Negative for fever and weight loss.   HENT: Negative for congestion, nosebleeds and tinnitus.   Eyes: Negative for blurred vision, double vision and redness.  Respiratory: Negative for cough, hemoptysis and shortness of breath.   Cardiovascular: Negative for chest pain, orthopnea, leg swelling and PND.  Gastrointestinal: Positive for nausea, vomiting and constipation. Negative for abdominal pain, diarrhea and melena.  Genitourinary: Negative for dysuria, urgency and hematuria.  Musculoskeletal: Negative for joint pain and falls.  Neurological: Positive for weakness. Negative for dizziness, tingling, sensory change, focal weakness, seizures and headaches.  Endo/Heme/Allergies: Negative for polydipsia. Does not bruise/bleed easily.  Psychiatric/Behavioral: Negative for depression and memory loss. The patient is not nervous/anxious.     MEDICATIONS AT HOME:   Prior to Admission medications   Medication Sig Start Date End Date Taking? Authorizing Provider  acetaminophen (TYLENOL) 500 MG tablet Take 1,000 mg by mouth every 6 (six) hours as needed.   Yes Historical Provider, MD  amLODipine (NORVASC) 5 MG tablet take 1 tablet by mouth daily 05/20/15  Yes Jackolyn Confer, MD  cyclobenzaprine (FLEXERIL) 5 MG tablet Take 1 tablet (5 mg total) by mouth 3 (three) times daily as needed for muscle spasms. 05/14/15  Yes Jackolyn Confer, MD  DULoxetine (CYMBALTA) 60 MG capsule take 1 capsule by mouth once daily 05/15/15  Yes Jackolyn Confer, MD  furosemide (LASIX) 20 MG tablet Take 1 tablet (20 mg total) by mouth daily. 04/16/15  Yes Jackolyn Confer, MD  hydrALAZINE (APRESOLINE) 50 MG tablet take 1 tablet by mouth four times a day 01/10/15  Yes Jackolyn Confer, MD  levETIRAcetam (KEPPRA) 500 MG tablet take 1 tablet by mouth twice a day 01/24/15  Yes Pieter Partridge, DO  losartan (COZAAR) 100 MG tablet take 1 tablet by mouth once daily 04/28/15  Yes Jackolyn Confer, MD  metoprolol succinate (TOPROL-XL) 50 MG 24 hr tablet Take 1 tablet (50 mg  total) by mouth daily. 02/27/15  Yes Wellington Hampshire, MD  NEXIUM 40 MG capsule take 1 capsule by mouth once daily 09/02/13  Yes Jackolyn Confer, MD  rosuvastatin (CRESTOR) 10 MG tablet Take 1 tablet (10 mg total) by mouth daily. 04/16/15  Yes Jackolyn Confer, MD  sucralfate (CARAFATE) 1 G tablet take 1 tablet by mouth four times a day 03/20/15  Yes Jackolyn Confer, MD  tamsulosin (FLOMAX) 0.4 MG CAPS capsule Take 0.4 mg by mouth daily.   Yes Historical Provider, MD  traMADol (ULTRAM) 50 MG tablet Take 2 tablets (100 mg total) by mouth 3 (three) times daily as needed. 04/16/15  Yes Jackolyn Confer, MD  VITAMIN D,  CHOLECALCIFEROL, PO Take 1,000 mg by mouth daily.    Yes Historical Provider, MD      VITAL SIGNS:  Blood pressure 94/56, pulse 65, temperature 95.1 F (35.1 C), temperature source Rectal, resp. rate 18, height 5\' 9"  (1.753 m), weight 81.647 kg (180 lb), SpO2 98 %.  PHYSICAL EXAMINATION:  Physical Exam  GENERAL:  64 y.o.-year-old patient lying in the bed with no acute distress.  EYES: Pupils equal, round, reactive to light and accommodation. No scleral icterus. Extraocular muscles intact.  HEENT: Head atraumatic, normocephalic. Oropharynx and nasopharynx clear. No oropharyngeal erythema, dry oral mucosa  NECK:  Supple, no jugular venous distention. No thyroid enlargement, no tenderness.  LUNGS: Normal breath sounds bilaterally, no wheezing, rales, rhonchi. No use of accessory muscles of respiration.  CARDIOVASCULAR: S1, S2 RRR. No murmurs, rubs, gallops, clicks.  ABDOMEN: Soft, nontender, nondistended. Bowel sounds present. No organomegaly or mass.  EXTREMITIES: No pedal edema, cyanosis, or clubbing. + 2 pedal & radial pulses b/l.   NEUROLOGIC: Cranial nerves II through XII are intact. No focal Motor or sensory deficits appreciated b/l PSYCHIATRIC: The patient is alert and oriented x 3. Good affect.  SKIN: No obvious rash, lesion, or ulcer.   LABORATORY PANEL:   CBC  Recent  Labs Lab 06/19/15 1449  WBC 7.8  HGB 10.7*  HCT 33.5*  PLT 163   ------------------------------------------------------------------------------------------------------------------  Chemistries   Recent Labs Lab 06/19/15 1449  NA 135  K 4.2  CL 106  CO2 23  GLUCOSE 116*  BUN 28*  CREATININE 2.33*  CALCIUM 9.0  AST 21  ALT 20  ALKPHOS 68  BILITOT 0.5   ------------------------------------------------------------------------------------------------------------------  Cardiac Enzymes No results for input(s): TROPONINI in the last 168 hours. ------------------------------------------------------------------------------------------------------------------  RADIOLOGY:  Dg Abd Acute W/chest  06/19/2015  CLINICAL DATA:  Constipation and decreased urine output.  Anemia EXAM: DG ABDOMEN ACUTE W/ 1V CHEST COMPARISON:  Chest radiograph June 03, 2014; CT abdomen and pelvis August 21, 2013 FINDINGS: PA chest: There is no edema or consolidation. Heart size and pulmonary vascularity are within normal limits. No adenopathy. There is postoperative change in the lower cervical spine. 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 Supine and upright abdomen: There is moderate stool throughout the colon. There is no bowel dilatation or air-fluid level suggesting obstruction. No free air. There is no soft tissue fullness in the pelvis to suggest urinary bladder distention radiographically. There are surgical clips in the upper and right mid abdomen regions. A surgical clip is also noted in the pelvis. IMPRESSION: Moderate stool throughout colon. Bowel gas pattern unremarkable. No obstruction or free air. No obvious urinary bladder distention. No lung edema or consolidation. Electronically Signed   By: Lowella Grip III M.D.   On: 06/19/2015 15:38     IMPRESSION AND PLAN:   64 year old male with past medical history of hypertension, GERD, history of seizures, BPH, history of CHF,  DJD who presented to the hospital due to generalized weakness and also noted to have acute renal failure.  #1 acute renal failure-this is likely secondary to dehydration and likely prerenal in nature. -Patient apparently drinks a lot of caffeinated beverages without much water. I will aggressively hydrated with IV fluids and follow BUN/creatinine urine output. There is no evidence of urinary retention.  #2 history of seizures-continue Keppra.  #3 GERD-continue Protonix.  #4 BPH-continue Flomax.  #5 Hypertension-continue Toprol, losartan, hydralazine, Norvasc.  #6 osteoarthritis-continue as needed tramadol.  #7 hyperlipidemia-continue Crestor   All the records  are reviewed and case discussed with ED provider. Management plans discussed with the patient, family and they are in agreement.  CODE STATUS: Full  TOTAL TIME TAKING CARE OF THIS PATIENT: 45 minutes.    Henreitta Leber M.D on 06/19/2015 at 5:27 PM  Between 7am to 6pm - Pager - [phone]  After 6pm go to [email or web address] - password [email or web address] [facility]  Office  [phone]  CC: Primary care physician; Rica Mast, MD

## 2015-06-19 NOTE — Telephone Encounter (Signed)
Pt called about not urintating for three days now. Pt lower back is hurting. Thank You!

## 2015-06-19 NOTE — Progress Notes (Signed)
Patient ID: Justin Patel, male   DOB: 1951/02/01, 64 y.o.   MRN: [id number]  [name], MD Phone: [phone]  Justin Patel is a 64 y.o. male who presents today for same-day visit.  Patient presented for urinary retention. Notes he has not urinated in 3 days. He has not had a bowel movement in 3 days as well. He notes he started having some back pain in his low back last night. He additionally notes a number of other complaints. Including right leg swelling that started this morning with a bruise in his right groin. His left leg is swollen but not as much as the right side. He notes he's had a pain in between her shoulder blades as well, though does state he has a cervical spine fusion. Has not had any chest pain or shortness of breath in the last several days. He does note that the pain between her shoulder blades has radiated to his chest. He feels overall weak and fatigued. He also notes he feels confused. He has not had any saddle anesthesia or lower extremity weakness. He notes they checked his temp last night and it was 99 Fahrenheit. He has not had any abdominal pain or the urge to urinate.  PMH: nonsmoker.   ROS see history of present illness  Objective  Physical Exam Filed Vitals:   06/19/15 1346  BP:   Pulse:   Temp: 93.9 F (34.4 C)    Physical Exam  Constitutional:  Patient is tired appearing. He does have a mild shakiness to him., He is not in any acute distress  HENT:  Head: Normocephalic and atraumatic.  Dry mucous membranes  Eyes: Conjunctivae are normal. Pupils are equal, round, and reactive to light.  Cardiovascular: Normal rate, regular rhythm and normal heart sounds.  Exam reveals no gallop and no friction rub.   No murmur heard. Pulmonary/Chest: Effort normal and breath sounds normal. No respiratory distress. He has no wheezes.  Bibasilar crackles  Abdominal: Soft. Bowel sounds are normal. He exhibits no distension. There is no tenderness. There is  no rebound and no guarding.  Musculoskeletal:  Right lower extremity noticeably more swollen than left lower extremity, there is no tenderness in the calves or erythema in the calves, there is bruising just distal to the right inguinal area, there is no tenderness over this bruising, he does have midline lumbar spine tenderness, there is no overlying erythema or swelling  Neurological:  Patient is alert, though does report he is confused, had difficulty standing from wheelchair and was shaky during this standing process, 5 out of 5 strength in bilateral quads, and she is, plantar flexion, and dorsiflexion, sensation to light touch is intact in bilateral lower extremities  Skin: Skin is warm and dry.    Assessment/Plan: Please see individual problem list.  Acute confusional state Patient comes in the office today with complaint of urinary retention. He has a number of other complaints most concerning of which are confusion, back pain, urinary and stool retention. He is hypothermic in the office with a oral temperature 92.9 Fahrenheit and an axillary temperature 93.9 Fahrenheit. Suspect the patient is septic at this time. Question is what is the source of his infection. Given his temperature and overall appearance he needs emergent evaluation in the ED for this. We'll defer workup and further evaluation to the ED physician. EMS was called to transport the patient to the ED.   [name]

## 2015-06-19 NOTE — Progress Notes (Signed)
Pre visit review using our clinic review tool, if applicable. No additional management support is needed unless otherwise documented below in the visit note. 

## 2015-06-19 NOTE — ED Notes (Signed)
Called [facility] and spoke with Jocelyn. Was told they will not take report from [place] until 1915 and that I could call back after then.

## 2015-06-20 DIAGNOSIS — F1110 Opioid abuse, uncomplicated: Secondary | ICD-10-CM

## 2015-06-20 HISTORY — DX: Opioid abuse, uncomplicated: F11.10

## 2015-06-20 LAB — BASIC METABOLIC PANEL
ANION GAP: 5 (ref 5–15)
BUN: 25 mg/dL — ABNORMAL HIGH (ref 6–20)
CALCIUM: 8.1 mg/dL — AB (ref 8.9–10.3)
CO2: 23 mmol/L (ref 22–32)
CREATININE: 1.85 mg/dL — AB (ref 0.61–1.24)
Chloride: 113 mmol/L — ABNORMAL HIGH (ref 101–111)
GFR, EST AFRICAN AMERICAN: 43 mL/min — AB (ref >60)
GFR, EST NON AFRICAN AMERICAN: 37 mL/min — AB (ref >60)
Glucose, Bld: 44 mg/dL — CL (ref 65–99)
Potassium: 4.3 mmol/L (ref 3.5–5.1)
SODIUM: 141 mmol/L (ref 135–145)

## 2015-06-20 LAB — GLUCOSE, CAPILLARY
GLUCOSE-CAPILLARY: 52 mg/dL — AB (ref 65–99)
GLUCOSE-CAPILLARY: 62 mg/dL — AB (ref 65–99)
Glucose-Capillary: 93 mg/dL (ref 65–99)

## 2015-06-20 LAB — TSH: TSH: 1.041 u[IU]/mL (ref 0.350–4.500)

## 2015-06-20 LAB — CORTISOL: CORTISOL PLASMA: 8 ug/dL

## 2015-06-20 MED ORDER — VITAMIN B-1 100 MG PO TABS
100.0000 mg | ORAL_TABLET | Freq: Every day | ORAL | Status: DC
  Administered 2015-06-20 – 2015-06-21 (×2): 100 mg via ORAL
  Filled 2015-06-20 (×2): qty 1

## 2015-06-20 MED ORDER — LORAZEPAM 2 MG/ML IJ SOLN: 1.0000 mg | Freq: Four times a day (QID) | INTRAMUSCULAR | Status: DC | PRN

## 2015-06-20 MED ORDER — ADULT MULTIVITAMIN W/MINERALS CH
1.0000 | ORAL_TABLET | Freq: Every day | ORAL | Status: DC
  Administered 2015-06-20 – 2015-06-21 (×2): 1 via ORAL
  Filled 2015-06-20 (×2): qty 1

## 2015-06-20 MED ORDER — THIAMINE HCL 100 MG/ML IJ SOLN: 100.0000 mg | Freq: Every day | INTRAMUSCULAR | Status: DC

## 2015-06-20 MED ORDER — FOLIC ACID 1 MG PO TABS
1.0000 mg | ORAL_TABLET | Freq: Every day | ORAL | Status: DC
  Administered 2015-06-20 – 2015-06-21 (×2): 1 mg via ORAL
  Filled 2015-06-20 (×2): qty 1

## 2015-06-20 MED ORDER — LORAZEPAM 1 MG PO TABS: 1.0000 mg | ORAL_TABLET | Freq: Four times a day (QID) | ORAL | Status: DC | PRN

## 2015-06-20 MED ORDER — DEXTROSE-NACL 5-0.9 % IV SOLN
INTRAVENOUS | Status: DC
  Administered 2015-06-20 (×2): via INTRAVENOUS

## 2015-06-20 NOTE — Progress Notes (Signed)
Initial Nutrition Assessment   INTERVENTION:   Meals and Snacks: Cater to patient preferences Medical Food Supplement Therapy: will recommend on follow if intake poor  NUTRITION DIAGNOSIS:   Inadequate oral intake related to acute illness as evidenced by per patient/family report.  GOAL:   Patient will meet greater than or equal to 90% of their needs  MONITOR:    (Energy Intake, Electrolyte and Renal Profile, Digestive system)  REASON FOR ASSESSMENT:   Diagnosis    ASSESSMENT:   Pt admitted with acute renal failure secondary to poor po intake and EtOH use.   Past Medical History  Diagnosis Date  . Hypertension   . Anxiety   . Sleep apnea     hx not now since wt loss  . Diabetes mellitus   . Stones in the urinary tract   . GERD (gastroesophageal reflux disease)   . H/O hiatal hernia   . Headache(784.0)   . Arthritis   . 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 Gastric ulcer   . Degenerative disc disease   . Sacral fracture, closed ([place])   . Syncope and collapse   . Chronic kidney disease   . Heart murmur   . Hyperlipidemia   . Hx of congestive heart failure   . Iron deficiency anemia 02/28/2015    Diet Order:  Diet Heart Room service appropriate?: Yes; Fluid consistency:: Thin    Current Nutrition: Recorded po intake 100% of meals since admission  Food/Nutrition-Related History: Per MD note pt with poor po intake with n/v for a few days PTA and per wifes reports pt drinking 2-3 bottles of wine daily. Also note per H&P pt drinks a lot of caffeinated beverages.   Scheduled Medications:  . amLODipine  5 mg Oral Daily  . cholecalciferol  1,000 Units Oral Daily  . DULoxetine  60 mg Oral Daily  . folic acid  1 mg Oral Daily  . heparin  5,000 Units Subcutaneous 3 times per day  . hydrALAZINE  50 mg Oral QID  . levETIRAcetam  500 mg Oral BID  . losartan  100 mg Oral Daily  . metoprolol succinate  50 mg Oral Daily  . multivitamin with minerals  1  tablet Oral Daily  . pantoprazole  40 mg Oral Daily  . rosuvastatin  10 mg Oral Daily  . sucralfate  1 g Oral TID AC & HS  . tamsulosin  0.4 mg Oral Daily  . thiamine  100 mg Oral Daily   Or  . thiamine  100 mg Intravenous Daily    Continuous Medications:  . dextrose 5 % and 0.9% NaCl 75 mL/hr at 06/20/15 1011     Electrolyte/Renal Profile and Glucose Profile:   Recent Labs Lab 06/19/15 1449 06/20/15 0446  NA 135 141  K 4.2 4.3  CL 106 113*  CO2 23 23  BUN 28* 25*  CREATININE 2.33* 1.85*  CALCIUM 9.0 8.1*  GLUCOSE 116* 44*   Protein Profile:  Recent Labs Lab 06/19/15 1449  ALBUMIN 3.9    Gastrointestinal Profile: Last BM:  06/20/2015   Weight Change: Per [facility] no decrease in weight, pt weight potentially down from October per [facility]   Height:   Ht Readings from Last 1 Encounters:  06/19/15 5\' 9"  (1.753 m)    Weight:   Wt Readings from Last 1 Encounters:  06/19/15 180 lb (81.647 kg)    Wt Readings from Last 10 Encounters:  06/19/15 180 lb (  81.647 kg)  06/19/15 180 lb (81.647 kg)  06/17/15 170 lb (77.111 kg)  06/06/15 192 lb 9 oz (87.346 kg)  05/23/15 187 lb 6.3 oz (85 kg)  05/14/15 185 lb 4 oz (84.029 kg)  04/16/15 192 lb 8 oz (87.317 kg)  03/03/15 185 lb 8 oz (84.142 kg)  02/28/15 193 lb 2 oz (87.601 kg)  01/03/15 191 lb 9.3 oz (86.9 kg)    BMI:  Body mass index is 26.57 kg/(m^2).   EDUCATION NEEDS:   No education needs identified at this time   [place], [place], LDN Pager [phone]

## 2015-06-20 NOTE — Plan of Care (Signed)
Problem: Bowel/Gastric: Goal: Will not experience complications related to bowel motility Outcome: Progressing Pt admitted for Acute Renal Failure, pt stated that he have not voided nor had a BM since 3 days ago.Currently in iv fluids at 100 ml/hr. Blood pressure improving. Tramadol for chronic pain with relief. No respiratory issues noted. Continue to monitor.

## 2015-06-20 NOTE — Progress Notes (Signed)
New glucose results 62, will give 4 more oz of orange juice. Continue to monitor.

## 2015-06-20 NOTE — Evaluation (Signed)
Physical Therapy Evaluation Patient Details Name: Justin Patel MRN: [id number] DOB: [birth year] Today's Date: 06/20/2015   History of Present Illness  Patient is a pleasant 64 y/o male that presents with progressive weakness and decreased bowel movements x 3 days. Patient does not endorse any falls over the last several months.   Clinical Impression  Patient is a 64 y/o male that typically ambulates with a SPC on occasion, though generally with no AD. He does not endorse any falls, and displays no strength or balance deficits in this session. Patient has been using the bathroom independently with no loss of balance. No deficits identified during this session, PT will sign off on this case.     Follow Up Recommendations No PT follow up    Equipment Recommendations       Recommendations for Other Services       Precautions / Restrictions Precautions Precautions: None Restrictions Weight Bearing Restrictions: No      Mobility  Bed Mobility Overal bed mobility: Independent                Transfers Overall transfer level: Independent                  Ambulation/Gait Ambulation/Gait assistance: Modified independent (Device/Increase time) Ambulation Distance (Feet): 200 Feet Assistive device:  (IV pole ) Gait Pattern/deviations: WFL(Within Functional Limits)   Gait velocity interpretation: <1.8 ft/sec, indicative of risk for recurrent falls General Gait Details: No balance deficits in gait noted.   Stairs            Wheelchair Mobility    Modified Rankin (Stroke Patients Only)       Balance Overall balance assessment: Modified Independent                                           Pertinent Vitals/Pain Pain Assessment: No/denies pain    Home Living Family/patient expects to be discharged to:: Private residence Living Arrangements: Spouse/significant other Available Help at Discharge: Family;Available  PRN/intermittently Type of Home: House Home Access: Stairs to enter Entrance Stairs-Rails: None Entrance Stairs-Number of Steps: 3 Home Layout: One level Home Equipment: Walker - 2 wheels;Crutches;Shower seat - built in      Prior Function Level of Independence: Independent         Comments: Occasional use of SPC     Hand Dominance   Dominant Hand: Right    Extremity/Trunk Assessment   Upper Extremity Assessment: Overall WFL for tasks assessed           Lower Extremity Assessment: Overall WFL for tasks assessed         Communication   Communication: No difficulties  Cognition Arousal/Alertness: Awake/alert Behavior During Therapy: WFL for tasks assessed/performed Overall Cognitive Status: Within Functional Limits for tasks assessed                      General Comments      Exercises        Assessment/Plan    PT Assessment Patent does not need any further PT services  PT Diagnosis     PT Problem List    PT Treatment Interventions     PT Goals (Current goals can be found in the Care Plan section)      Frequency     Barriers to discharge        Co-evaluation  End of Session Equipment Utilized During Treatment: Gait belt Activity Tolerance: Patient tolerated treatment well Patient left: in bed;with call bell/phone within reach Nurse Communication: Mobility status    Functional Assessment Tool Used: Clinical judgement  Functional Limitation: Mobility: Walking and moving around Mobility: Walking and Moving Around Current Status [id number]): 0 percent impaired, limited or restricted Mobility: Walking and Moving Around Discharge Status [phone]): 0 percent impaired, limited or restricted    Time: [id number] PT Time Calculation (min) (ACUTE ONLY): 8 min   Charges:   PT Evaluation $Initial PT Evaluation Tier I: 1 Procedure     PT G Codes:   PT G-Codes **NOT FOR INPATIENT CLASS** Functional Assessment Tool Used:  Clinical judgement  Functional Limitation: Mobility: Walking and moving around Mobility: Walking and Moving Around Current Status [id number]): 0 percent impaired, limited or restricted Mobility: Walking and Moving Around Discharge Status [id number]): 0 percent impaired, limited or restricted   Kerman Passey, PT, DPT    06/20/2015, 4:28 PM

## 2015-06-20 NOTE — Plan of Care (Signed)
Problem: Bowel/Gastric: Goal: Will not experience complications related to bowel motility Outcome: Progressing Pt is alert and oriented, c/o chronic back pain improved with tramadol, on room air, poor appetite, history of gastric bypass in 2010, afebrile throughout shift, vital signs stable, worked with physical therapy, ambulates to bathroom independently, up to bathroom for urination, wife spoke with doctor and reported alcohol usage, pt now on ciwa, calm and pleasant. BM overnight and in am per pt. dnies abdominal pain and n/v., FSBS improving since this am. Uneventful shfit.

## 2015-06-20 NOTE — Consult Note (Signed)
[facility] Face-to-Face Psychiatry Consult   Reason for Consult: Consult for this 64 year old man with chronic pain with concerns about opiate and alcohol misuse Referring Physician:  Verdell Carmine Patient Identification: SHERIDAN GETTEL MRN:  [id number] Principal Diagnosis: Opiate abuse Diagnosis:   Patient Active Problem List   Diagnosis Date Noted  . Acute confusional state [F05] 06/19/2015  . Acute renal failure (ARF) ([place]) [N17.9] 06/19/2015  . Chronic pain syndrome [G89.4] 04/16/2015  . Iron deficiency anemia [D50.9] 02/28/2015  . Leg edema, right [R60.0] 11/12/2014  . Alcohol abuse [F10.10] 10/01/2014  . Intracerebral hemorrhage ([place]) [I61.9] 06/04/2014  . Brain bleed ([place]) [I61.9] 06/04/2014  . SDH (subdural hematoma) ([place]) [I62.00] 06/03/2014  . Syncope [R55] 06/03/2014  . Fall [W19.XXXA] 06/03/2014  . 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 Left knee pain [M25.562] 02/04/2014  . Gonalgia [M25.569] 02/04/2014  . Benign prostatic hyperplasia with urinary obstruction [N40.1] 11/16/2013  . Enlarged prostate [N40.0] 11/08/2013  . Anemia [D64.9] 11/08/2013  . Hypovitaminosis D [E55.9] 10/11/2013  . Fracture of metatarsal bone of right foot [S92.301A] 10/11/2013  . Fracture of metatarsal [S92.309A] 10/11/2013  . Avitaminosis D [E55.9] 10/11/2013  . H/O urinary stone [Z87.442] 10/05/2013  . Urinary hesitancy [R39.11] 08/27/2013  . Renal cyst [Q61.00] 08/27/2013  . Upper GI bleed [K92.2] 08/20/2013  . Gastrointestinal bleeding, upper [K92.2] 08/20/2013  . Essential hypertension, benign [I10] 03/21/2013  . Other and unspecified hyperlipidemia [E78.5] 03/21/2013  . Obesity, unspecified [E66.9] 03/21/2013  . Adiposity [E66.9] 03/21/2013  . Benign essential HTN [I10] 03/21/2013  . HLD (hyperlipidemia) [E78.5] 03/21/2013    Total Time spent with patient: 45 minutes  Subjective:   KALIEB FREELAND is a 64 y.o. male  patient admitted with "I hadn't had a bowel movement in days".  HPI:  Information from the patient and the chart. Chart reviewed. Labs reviewed. Concern was raised that the patient is overusing his tramadol. He admits to me that he uses about 10 tablets a day when the maximum that he is prescribed to take is 8 tablets a day. According to the information in the note there may be some evidence that he takes even more than that. Tramadol as his primary medicine for his chronic back pain. He also admits that he drinks several pints size bottles of strong fortified wine every day and this is been going on for years. He is aware that it causes problems with his mood his concentration and his physical health. It sounds very much like a lot of the physical symptoms that brought him to the hospital this time could be the result of his overuse of opiates and alcohol. Mood is stated as being good. No complaints of depression. No complaints of suicidal ideation no psychosis or confusion.  Social history: Patient lives with his wife. He is a retired Radio producer. Sounds again has a reasonably good social life.  Medical history: Chronic back pain in part related to an injury. History of bypass surgery in his gut, history of high blood pressure. New history of renal insufficiency or failure.  Substance abuse history: Patient has been aware of his abuse of alcohol for years. He has gotten outpatient treatment a couple places in the area but has not required inpatient treatment. He has been dissatisfied with much of the outpatient treatment he has had.   Past Psychiatric History: Patient has no prior inpatient psychiatric treatment no history of suicide attempts. He does take Cymbalta but it  is primarily for pain to him. He has been to substance abuse treatment in the past and has attended classes at the [facility].  Risk to Self: Is patient at risk for suicide?: No Risk to Others:   Prior Inpatient Therapy:    Prior Outpatient Therapy:    Past Medical History:  Past Medical History  Diagnosis Date  . Hypertension   . Anxiety   . Sleep apnea     hx not now since wt loss  . Diabetes mellitus   . Stones in the urinary tract   . GERD (gastroesophageal reflux disease)   . H/O hiatal hernia   . Headache(784.0)   . Arthritis   . 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 Gastric ulcer   . Degenerative disc disease   . Sacral fracture, closed ([place])   . Syncope and collapse   . Chronic kidney disease   . Heart murmur   . Hyperlipidemia   . Hx of congestive heart failure   . Iron deficiency anemia 02/28/2015    Past Surgical History  Procedure Laterality Date  . Hernia repair  2000    hiatal  . Gastric bypass  2010    [place]  . Knee arthroscopy      bilateral, left x 2  . Osteotomy  2001    left  . Tonsillectomy    . Appendectomy    . Nasal sinus surgery      x5  . Uvulopalatoplasty  2011  . Anterior cervical decomp/discectomy fusion  01/26/2012    Procedure: ANTERIOR CERVICAL DECOMPRESSION/DISCECTOMY FUSION 3 LEVELS;  Surgeon: Floyce Stakes, MD;  Location: [facility];  Service: Neurosurgery;  Laterality: N/A;  Cervical three-four Cervical four-five Cervical five-six Cervical six-seven , Anterior cervical decompression/diskectomy, fusion, plate  . Total knee arthroplasty Left 05/2014    Dr. Marry Guan  . Cardiac catheterization      [facility], normal  . Cardiac catheterization      [place]  . Joint replacement     Family History:  Family History  Problem Relation Age of Onset  . Dementia Mother 26  . Heart disease Father 49  . Diabetes Father   . Lymphoma Sister     lymphoma, stage 4  . Ovarian cancer Sister 30    Ovarian  . Lupus Sister    Family Psychiatric  History: Patient states he had a sister who had a substance abuse problem and a long-term problem with mood. Social History:  History  Alcohol Use  . 0.6 oz/week  . 1 Glasses  of wine per week    Comment: occasional     History  Drug Use No    Social History   Social History  . Marital Status: Married    Spouse Name: N/A  . Number of Children: N/A  . Years of Education: N/A   Social History Main Topics  . Smoking status: Never Smoker   . Smokeless tobacco: Never Used  . Alcohol Use: 0.6 oz/week    1 Glasses of wine per week     Comment: occasional  . Drug Use: No  . Sexual Activity:    Partners: Female   Other Topics Concern  . None   Social History Narrative   Lives in [place] with wife, [place]. 2 sons, William Hamburger, Lennette Bihari 47.. 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 Altace [Ramipril] Anaphylaxis  . Levaquin [Levofloxacin In D5w] Hives  . Lyrica [Pregabalin]     Edema  . Metformin Other (See Comments) and Diarrhea    High doses cause diarrhea. High doses cause diarrhea.  . Iohexol Hives     Desc: HIVES     Labs:  Results for orders placed or performed during the hospital encounter of 06/19/15 (from the past 48 hour(s))  Urinalysis complete, with microscopic ([facility] only)     Status: Abnormal   Collection Time: 06/19/15  2:48 PM  Result Value Ref Range   Color, Urine AMBER (A) YELLOW   APPearance CLEAR (A) CLEAR   Glucose, UA NEGATIVE NEGATIVE mg/dL   Bilirubin Urine 1+ (A) NEGATIVE   Ketones, ur TRACE (A) NEGATIVE mg/dL   Specific Gravity, Urine 1.023 1.005 - 1.030   Hgb urine dipstick NEGATIVE NEGATIVE   pH 5.0 5.0 - 8.0   Protein, ur 30 (A) NEGATIVE mg/dL   Nitrite NEGATIVE NEGATIVE   Leukocytes, UA NEGATIVE NEGATIVE   RBC / HPF 0-5 0 - 5 RBC/hpf   WBC, UA 0-5 0 - 5 WBC/hpf   Bacteria, UA RARE (A) NONE SEEN    Squamous Epithelial / LPF NONE SEEN NONE SEEN   Mucous PRESENT    Hyaline Casts, UA PRESENT   CBC with Differential/Platelet     Status: Abnormal   Collection Time: 06/19/15  2:49 PM  Result Value Ref Range   WBC 7.8 3.8 - 10.6 K/uL   RBC 3.33 (L) 4.40 - 5.90 MIL/uL   Hemoglobin 10.7 (L) 13.0 - 18.0 g/dL   HCT 33.5 (L) 40.0 - 52.0 %   MCV 100.7 (H) 80.0 - 100.0 fL   MCH 32.2 26.0 - 34.0 pg   MCHC 32.0 32.0 - 36.0 g/dL   RDW 15.2 (H) 11.5 - 14.5 %   Platelets 163 150 - 440 K/uL   Neutrophils Relative % 83 %   Neutro Abs 6.4 1.4 - 6.5 K/uL   Lymphocytes Relative 9 %   Lymphs Abs 0.7 (L) 1.0 - 3.6 K/uL   Monocytes Relative 7 %   Monocytes Absolute 0.6 0.2 - 1.0 K/uL   Eosinophils Relative 1 %   Eosinophils Absolute 0.1 0 - 0.7 K/uL   Basophils Relative 0 %   Basophils Absolute 0.0 0 - 0.1 K/uL  Comprehensive metabolic panel     Status: Abnormal   Collection Time: 06/19/15  2:49 PM  Result Value Ref Range   Sodium 135 135 - 145 mmol/L   Potassium 4.2 3.5 - 5.1 mmol/L   Chloride 106 101 - 111 mmol/L   CO2 23 22 - 32 mmol/L   Glucose, Bld 116 (H) 65 - 99 mg/dL   BUN 28 (H) 6 - 20 mg/dL   Creatinine, Ser 2.33 (H) 0.61 - 1.24 mg/dL   Calcium 9.0 8.9 - 10.3 mg/dL   Total Protein 6.7 6.5 - 8.1 g/dL   Albumin 3.9 3.5 - 5.0 g/dL   AST 21 15 - 41 U/L   ALT 20 17 - 63 U/L   Alkaline Phosphatase 68 38 - 126 U/L  Total Bilirubin 0.5 0.3 - 1.2 mg/dL   GFR calc non Af Amer 28 (L) >60 mL/min   GFR calc Af Amer 32 (L) >60 mL/min    Comment: (NOTE) The eGFR has been calculated using the CKD EPI equation. This calculation has not been validated in all clinical situations. eGFR's persistently <60 mL/min signify possible Chronic Kidney Disease.    Anion gap 6 5 - 15  Basic metabolic panel     Status: Abnormal   Collection Time: 06/20/15  4:46 AM  Result Value Ref Range   Sodium 141 135 - 145 mmol/L   Potassium 4.3 3.5 - 5.1 mmol/L   Chloride 113 (H) 101 - 111 mmol/L   CO2 23 22 - 32  mmol/L   Glucose, Bld 44 (LL) 65 - 99 mg/dL    Comment: CRITICAL RESULT CALLED TO, READ BACK BY AND VERIFIED WITH SILVIA FUENTES A 8241 06/20/15 WDM    BUN 25 (H) 6 - 20 mg/dL   Creatinine, Ser 4.13 (H) 0.61 - 1.24 mg/dL   Calcium 8.1 (L) 8.9 - 10.3 mg/dL   GFR calc non Af Amer 37 (L) >60 mL/min   GFR calc Af Amer 43 (L) >60 mL/min    Comment: (NOTE) The eGFR has been calculated using the CKD EPI equation. This calculation has not been validated in all clinical situations. eGFR's persistently <60 mL/min signify possible Chronic Kidney Disease.    Anion gap 5 5 - 15  TSH     Status: None   Collection Time: 06/20/15  4:46 AM  Result Value Ref Range   TSH 1.041 0.350 - 4.500 uIU/mL  Glucose, capillary     Status: Abnormal   Collection Time: 06/20/15  6:42 AM  Result Value Ref Range   Glucose-Capillary 52 (L) 65 - 99 mg/dL  Glucose, capillary     Status: Abnormal   Collection Time: 06/20/15  7:00 AM  Result Value Ref Range   Glucose-Capillary 62 (L) 65 - 99 mg/dL  Glucose, capillary     Status: None   Collection Time: 06/20/15  7:17 AM  Result Value Ref Range   Glucose-Capillary 93 65 - 99 mg/dL  Cortisol     Status: None   Collection Time: 06/20/15 12:03 PM  Result Value Ref Range   Cortisol, Plasma 8.0 ug/dL    Comment: (NOTE) AM    6.7 - 22.6 ug/dL PM   <87.4       ug/dL Performed at [facility]     Current Facility-Administered Medications  Medication Dose Route Frequency Provider Last Rate Last Dose  . acetaminophen (TYLENOL) tablet 650 mg  650 mg Oral Q6H PRN Houston Siren, MD       Or  . acetaminophen (TYLENOL) suppository 650 mg  650 mg Rectal Q6H PRN Houston Siren, MD      . amLODipine (NORVASC) tablet 5 mg  5 mg Oral Daily Houston Siren, MD   5 mg at 06/20/15 0752  . cholecalciferol (VITAMIN D) tablet 1,000 Units  1,000 Units Oral Daily Houston Siren, MD   1,000 Units at 06/20/15 [phone]  . cyclobenzaprine (FLEXERIL) tablet 5 mg  5 mg Oral TID PRN  Houston Siren, MD      . dextrose 5 %-0.9 % sodium chloride infusion   Intravenous Continuous Auburn Bilberry, MD 75 mL/hr at 06/20/15 1011    . DULoxetine (CYMBALTA) DR capsule 60 mg  60 mg Oral Daily Houston Siren, MD  60 mg at 06/20/15 0752  . folic acid (FOLVITE) tablet 1 mg  1 mg Oral Daily Dustin Flock, MD   1 mg at 06/20/15 1341  . heparin injection 5,000 Units  5,000 Units Subcutaneous 3 times per day Henreitta Leber, MD   5,000 Units at 06/20/15 1307  . hydrALAZINE (APRESOLINE) tablet 50 mg  50 mg Oral QID Henreitta Leber, MD   50 mg at 06/20/15 1725  . lactulose (CHRONULAC) 10 GM/15ML solution 30 g  30 g Oral BID PRN Henreitta Leber, MD   30 g at 06/19/15 1731  . levETIRAcetam (KEPPRA) tablet 500 mg  500 mg Oral BID Henreitta Leber, MD   500 mg at 06/20/15 0752  . LORazepam (ATIVAN) tablet 1 mg  1 mg Oral Q6H PRN Dustin Flock, MD       Or  . LORazepam (ATIVAN) injection 1 mg  1 mg Intravenous Q6H PRN Dustin Flock, MD      . losartan (COZAAR) tablet 100 mg  100 mg Oral Daily Henreitta Leber, MD   100 mg at 06/20/15 0752  . metoprolol succinate (TOPROL-XL) 24 hr tablet 50 mg  50 mg Oral Daily Henreitta Leber, MD   50 mg at 06/20/15 0752  . multivitamin with minerals tablet 1 tablet  1 tablet Oral Daily Dustin Flock, MD   1 tablet at 06/20/15 1341  . ondansetron (ZOFRAN) tablet 4 mg  4 mg Oral Q6H PRN Henreitta Leber, MD       Or  . ondansetron [facility]) injection 4 mg  4 mg Intravenous Q6H PRN Henreitta Leber, MD   4 mg at 06/19/15 1910  . pantoprazole (PROTONIX) EC tablet 40 mg  40 mg Oral Daily Henreitta Leber, MD   40 mg at 06/20/15 0752  . rosuvastatin (CRESTOR) tablet 10 mg  10 mg Oral Daily Henreitta Leber, MD   10 mg at 06/20/15 0752  . sucralfate (CARAFATE) tablet 1 g  1 g Oral TID AC & HS Henreitta Leber, MD   1 g at 06/20/15 1725  . tamsulosin (FLOMAX) capsule 0.4 mg  0.4 mg Oral Daily Henreitta Leber, MD   0.4 mg at 06/20/15 0752  . thiamine (VITAMIN B-1) tablet 100  mg  100 mg Oral Daily Dustin Flock, MD   100 mg at 06/20/15 1341   Or  . thiamine (B-1) injection 100 mg  100 mg Intravenous Daily Dustin Flock, MD      . traMADol Veatrice Bourbon) tablet 100 mg  100 mg Oral Q6H PRN Henreitta Leber, MD   100 mg at 06/20/15 2030    Musculoskeletal: Strength & Muscle Tone: decreased Gait & Station: unsteady Patient leans: N/A  Psychiatric Specialty Exam: Review of Systems  Constitutional: Positive for malaise/fatigue.  HENT: Negative.   Eyes: Negative.   Respiratory: Negative.   Cardiovascular: Negative.   Gastrointestinal: Positive for constipation.  Genitourinary: Positive for dysuria.  Musculoskeletal: Positive for back pain.  Skin: Negative.   Neurological: Negative.   Psychiatric/Behavioral: Positive for substance abuse. Negative for depression, suicidal ideas, hallucinations and memory loss. The patient is not nervous/anxious and does not have insomnia.     Blood pressure 106/60, pulse 69, temperature 98.3 F (36.8 C), temperature source Oral, resp. rate 20, height _0  (1.753 m), weight 81.647 kg (180 lb), SpO2 96 %.Body mass index is 26.57 kg/(m^2).  General Appearance: Casual  Eye Contact::  Fair  Speech:  Slow  Volume:  Decreased  Mood:  Euthymic  Affect:  Appropriate  Thought Process:  Goal Directed  Orientation:  Full (Time, Place, and Person)  Thought Content:  Negative  Suicidal Thoughts:  No  Homicidal Thoughts:  No  Memory:  Immediate;   Good Recent;   Good Remote;   Good  Judgement:  Fair  Insight:  Fair  Psychomotor Activity:  Decreased  Concentration:  Fair  Recall:  New Site of Knowledge:Fair  Language: Fair  Akathisia:  No  Handed:  Right  AIMS (if indicated):     Assets:  Desire for Improvement Financial Resources/Insurance Housing Social Support  ADL's:  Intact  Cognition: WNL  Sleep:      Treatment Plan Summary: Plan Patient has good insight into the fact that he has a problem with alcohol. He is open  to the concept that he is misusing opiates as well. Education completed about the physical and psychological dangers of continued abuse of drugs and alcohol. Patient is agreeable to all of this. He states that he would like to get involved in substance abuse treatment through his local church. I encouraged this and also said that I would leave a specific recommendation. I will also ask the social workers to leave some recommendations about outpatient substance abuse treatment. Follow-up as needed. No other acute medical treatment at this point indicated. No sign of delirium tremens or alcohol withdrawal problems.  Disposition: Patient does not meet criteria for psychiatric inpatient admission. Supportive therapy provided about ongoing stressors. Discussed crisis plan, support from social network, calling 911, coming to the Emergency Department, and calling Suicide Hotline.  Aniston Christman 06/20/2015 8:36 PM

## 2015-06-20 NOTE — Progress Notes (Signed)
Received critical glucose of 44 from Lab, Glucose rechecked of 52 and gave pt 4 oz of orange juice. Paged MD Reece Levy to notify him of the glucose results, MD aware and notified. Orders from MD to continue to monitor.

## 2015-06-20 NOTE — Progress Notes (Addendum)
[place] at [facility]                                                                                                                                                                                            Patient Demographics   Justin Patel, is a 64 y.o. male, DOB - [birth year], [id number]:[id number]  Admit date - 06/19/2015   Admitting Physician Henreitta Leber, MD  Outpatient Primary MD for the patient is Rica Mast, MD   LOS -   Subjective: She reports that his feeling better, I discussed with his wife on the phone she reports that patient has been abusing his tramadol. He recently got tramadol prescription filled and its RD missing 70 tablets in 3-1/2 days according to her. She also reports that he has been drinking large amount of wine 2-3 bottles per day. Patient reports that he's feeling a little better but still very weak. Overnight his blood glucose was noted to be in the 60s.        Review of Systems:   CONSTITUTIONAL: No documented fever.  Positive fatigue  and weakness. No weight gain, no weight loss.  EYES: No blurry or double vision.  ENT: No tinnitus. No postnasal drip. No redness of the oropharynx.  RESPIRATORY: No cough, no wheeze, no hemoptysis. No dyspnea.  CARDIOVASCULAR: No chest pain. No orthopnea. No palpitations. No syncope.  GASTROINTESTINAL: No nausea, no vomiting or diarrhea. No abdominal pain. No melena or hematochezia.  GENITOURINARY: No dysuria or hematuria.  ENDOCRINE: No polyuria or nocturia. No heat or cold intolerance.  HEMATOLOGY: No anemia. No bruising. No bleeding.  INTEGUMENTARY: No rashes. No lesions.  MUSCULOSKELETAL: No arthritis. No swelling. No gout.  NEUROLOGIC: No numbness, tingling, or ataxia. No seizure-type activity.  PSYCHIATRIC: No anxiety. No insomnia. No ADD.    Vitals:   Filed Vitals:   06/19/15 2057 06/20/15 0130 06/20/15 0506 06/20/15 0750  BP: 88/56 98/55 108/60 109/65   Pulse: 66 67 69 64  Temp:   98 F (36.7 C)   TempSrc:   Oral   Resp:   20   Height:      Weight:      SpO2: 100%  96%     Wt Readings from Last 3 Encounters:  06/19/15 81.647 kg (180 lb)  06/19/15 81.647 kg (180 lb)  06/17/15 77.111 kg (170 lb)     Intake/Output Summary (Last 24 hours) at 06/20/15 1343 Last data filed at 06/20/15 0900  Gross per 24 hour  Intake 1246.67 ml  Output    650 ml  Net 596.67 ml  Physical Exam:   GENERAL: Pleasant-appearing in no apparent distress.  appears weak  HEAD, EYES, EARS, NOSE AND THROAT: Atraumatic, normocephalic. Extraocular muscles are intact. Pupils equal and reactive to light. Sclerae anicteric. No conjunctival injection. No oro-pharyngeal erythema.  NECK: Supple. There is no jugular venous distention. No bruits, no lymphadenopathy, no thyromegaly.  HEART: Regular rate and rhythm,. No murmurs, no rubs, no clicks.  LUNGS: Clear to auscultation bilaterally. No rales or rhonchi. No wheezes.  ABDOMEN: Soft, flat, nontender, nondistended. Has good bowel sounds. No hepatosplenomegaly appreciated.  EXTREMITIES: No evidence of any cyanosis, clubbing, or peripheral edema.  +2 pedal and radial pulses bilaterally.  NEUROLOGIC: The patient is alert, awake, and oriented x3 with no focal motor or sensory deficits appreciated bilaterally.  SKIN: Moist and warm with no rashes appreciated.  Psych: Not anxious, depressed LN: No inguinal LN enlargement    Antibiotics   Anti-infectives    None      Medications   Scheduled Meds: . amLODipine  5 mg Oral Daily  . cholecalciferol  1,000 Units Oral Daily  . DULoxetine  60 mg Oral Daily  . folic acid  1 mg Oral Daily  . heparin  5,000 Units Subcutaneous 3 times per day  . hydrALAZINE  50 mg Oral QID  . levETIRAcetam  500 mg Oral BID  . losartan  100 mg Oral Daily  . metoprolol succinate  50 mg Oral Daily  . multivitamin with minerals  1 tablet Oral Daily  . pantoprazole  40 mg Oral Daily   . rosuvastatin  10 mg Oral Daily  . sucralfate  1 g Oral TID AC & HS  . tamsulosin  0.4 mg Oral Daily  . thiamine  100 mg Oral Daily   Or  . thiamine  100 mg Intravenous Daily   Continuous Infusions: . dextrose 5 % and 0.9% NaCl 75 mL/hr at 06/20/15 1011   PRN Meds:.acetaminophen **OR** acetaminophen, cyclobenzaprine, lactulose, LORazepam **OR** LORazepam, ondansetron **OR** ondansetron (ZOFRAN) IV, traMADol   Data Review:   Micro Results No results found for this or any previous visit (from the past 240 hour(s)).  Radiology Reports Dg Abd Acute W/chest  06/19/2015  CLINICAL DATA:  Constipation and decreased urine output.  Anemia EXAM: DG ABDOMEN ACUTE W/ 1V CHEST COMPARISON:  Chest radiograph June 03, 2014; CT abdomen and pelvis August 21, 2013 FINDINGS: PA chest: There is no edema or consolidation. Heart size and pulmonary vascularity are within normal limits. No adenopathy. There is postoperative change in the lower cervical spine. 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 Supine and upright abdomen: There is moderate stool throughout the colon. There is no bowel dilatation or air-fluid level suggesting obstruction. No free air. There is no soft tissue fullness in the pelvis to suggest urinary bladder distention radiographically. There are surgical clips in the upper and right mid abdomen regions. A surgical clip is also noted in the pelvis. IMPRESSION: Moderate stool throughout colon. Bowel gas pattern unremarkable. No obstruction or free air. No obvious urinary bladder distention. No lung edema or consolidation. Electronically Signed   By: Lowella Grip III M.D.   On: 06/19/2015 15:38     CBC  Recent Labs Lab 06/19/15 1449  WBC 7.8  HGB 10.7*  HCT 33.5*  PLT 163  MCV 100.7*  MCH 32.2  MCHC 32.0  RDW 15.2*  LYMPHSABS 0.7*  MONOABS 0.6  EOSABS 0.1  BASOSABS 0.0    Chemistries   Recent Labs Lab 06/19/15 1449  06/20/15 0446  NA 135 141  K 4.2  4.3  CL 106 113*  CO2 23 23  GLUCOSE 116* 44*  BUN 28* 25*  CREATININE 2.33* 1.85*  CALCIUM 9.0 8.1*  AST 21  --   ALT 20  --   ALKPHOS 68  --   BILITOT 0.5  --    ------------------------------------------------------------------------------------------------------------------ estimated creatinine clearance is 40.3 mL/min (by C-G formula based on Cr of 1.85). ------------------------------------------------------------------------------------------------------------------ No results for input(s): HGBA1C in the last 72 hours. ------------------------------------------------------------------------------------------------------------------ No results for input(s): CHOL, HDL, LDLCALC, TRIG, CHOLHDL, LDLDIRECT in the last 72 hours. ------------------------------------------------------------------------------------------------------------------  Recent Labs  06/20/15 0446  TSH 1.041   ------------------------------------------------------------------------------------------------------------------ No results for input(s): VITAMINB12, FOLATE, FERRITIN, TIBC, IRON, RETICCTPCT in the last 72 hours.  Coagulation profile No results for input(s): INR, PROTIME in the last 168 hours.  No results for input(s): DDIMER in the last 72 hours.  Cardiac Enzymes No results for input(s): CKMB, TROPONINI, MYOGLOBIN in the last 168 hours.  Invalid input(s): CK ------------------------------------------------------------------------------------------------------------------ Invalid input(s): POCBNP    Assessment & Plan   64 year old male with past medical history of hypertension, GERD, history of seizures, BPH, history of CHF, DJD who presented to the hospital due to generalized weakness and also noted to have acute renal failure.  #1 acute renal failure-due to poor by mouth intake as well as likely alcohol abuse as per wife's report continue IV fluids and supportive care monitor renal  function in the a.m.  #2 alcohol abuse tramadol abuse as per wife: Start patient on CIWA protocol asked psychiatry to see  #3 GERD-continue Protonix.  #4 BPH-continue Flomax.  #5 Hypertension-continue Toprol, losartan, hydralazine, Norvasc.  #6 osteoarthritis-continue as needed tramadol.  #7 hyperlipidemia-continue Crestor  #9 generalized weakness PT evaluation   #10 hypoglycemia suspect due to poor by mouth intake and alcohol abuse and substance abuse currently on D5 containing fluid monitor blood sugar I will also check a random cortisol level     Code Status Orders        Start     Ordered   06/19/15 2111  Full code   Continuous     06/19/15 2110           Consults  physical therapy   DVT Prophylaxisheparin   Lab Results  Component Value Date   PLT 163 06/19/2015     Time Spent in minutes   40min   Dustin Flock M.D on 06/20/2015 at 1:43 PM  Between 7am to 6pm - Pager - [phone]  After 6pm go to [email or web address] - password [email or web address] [facility]   Office  [phone]

## 2015-06-21 LAB — BASIC METABOLIC PANEL
Anion gap: 5 (ref 5–15)
BUN: 17 mg/dL (ref 6–20)
CHLORIDE: 114 mmol/L — AB (ref 101–111)
CO2: 23 mmol/L (ref 22–32)
CREATININE: 1.12 mg/dL (ref 0.61–1.24)
Calcium: 8.4 mg/dL — ABNORMAL LOW (ref 8.9–10.3)
GFR calc Af Amer: >60 mL/min (ref >60)
GFR calc non Af Amer: >60 mL/min (ref >60)
GLUCOSE: 93 mg/dL (ref 65–99)
POTASSIUM: 4 mmol/L (ref 3.5–5.1)
SODIUM: 142 mmol/L (ref 135–145)

## 2015-06-21 MED ORDER — TRAMADOL HCL 50 MG PO TABS: 50.0000 mg | ORAL_TABLET | Freq: Four times a day (QID) | ORAL | Status: DC | PRN

## 2015-06-21 NOTE — Plan of Care (Signed)
Problem: Bowel/Gastric: Goal: Will not experience complications related to bowel motility Outcome: Progressing Continues on CIWA protocol, no signs of detox noted. Continues on iv fluids with 5% dextrose with NS. Labs scheduled for this am, awaiting for results. Up to the bathroom w/o difficulties. Tramadol for pain with relief. Continue to monitor.

## 2015-06-21 NOTE — Discharge Instructions (Signed)

## 2015-06-21 NOTE — Discharge Summary (Signed)
[place] [facility] [place] NAME: Justin Patel    MR#:  [id number]  DATE OF BIRTH:  [birth year]  DATE OF ADMISSION:  06/19/2015 ADMITTING PHYSICIAN: Henreitta Leber, MD  DATE OF DISCHARGE: 06/21/2015 10:47 AM  PRIMARY CARE PHYSICIAN: Rica Mast, MD    ADMISSION DIAGNOSIS:  AKI (acute kidney injury) ([place]) [N17.9]  DISCHARGE DIAGNOSIS:  Active Problems:   Alcohol abuse   Acute renal failure (ARF) (HCC)   Opiate abuse, continuous   SECONDARY DIAGNOSIS:   Past Medical History  Diagnosis Date  . Hypertension   . Anxiety   . Sleep apnea     hx not now since wt loss  . Diabetes mellitus   . Stones in the urinary tract   . GERD (gastroesophageal reflux disease)   . H/O hiatal hernia   . Headache(784.0)   . Arthritis   . 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 Gastric ulcer   . Degenerative disc disease   . Sacral fracture, closed ([place])   . Syncope and collapse   . Chronic kidney disease   . Heart murmur   . Hyperlipidemia   . Hx of congestive heart failure   . Iron deficiency anemia 02/28/2015    HOSPITAL COURSE:   64 year old male with past medical history of hypertension, GERD, history of seizures, BPH, history of CHF, DJD who presented to the hospital due to generalized weakness and also noted to have acute renal failure.  #1 acute renal failure-this was likely secondary to dehydration and increase use of caffeinated beverages and alcohol abuse. -Patient was hydrated with IV fluids aggressively and his BUN/creatinine is improved and now back to baseline.   #2 substance abuse/alcohol abuse-as per the wife patient has been abusing tramadol and also alcohol at home. Patient had no evidence of alcohol withdrawal on the hospital and he was placed on CIWA protocol. -Patient was seen by psychiatry and he recommended outpatient follow-up and patient wants to get help through his local church.  #3 history  of seizures-no acute seizures while in the hospital. continue [place].  #4 GERD-patient will continue Protonix.  #5 BPH-no evidence of urinary retention. continue Flomax.  #6 Hypertension-patient will continue Toprol, losartan, hydralazine, Norvasc.  #7 osteoarthritis-Pt. Will continue as needed tramadol.  #8 hyperlipidemia-pt. Will continue Crestor  DISCHARGE CONDITIONS:   Stable  CONSULTS OBTAINED:  Treatment Team:  Gonzella Lex, MD  DRUG ALLERGIES:   Allergies  Allergen Reactions  . Altace [Ramipril] Anaphylaxis  . Levaquin [Levofloxacin In D5w] Hives  . Lyrica [Pregabalin]     Edema  . Metformin Other (See Comments) and Diarrhea    High doses cause diarrhea. High doses cause diarrhea.  . Iohexol Hives     Desc: HIVES     DISCHARGE MEDICATIONS:   Discharge Medication List as of 06/21/2015  9:33 AM    CONTINUE these medications which have CHANGED   Details  traMADol (ULTRAM) 50 MG tablet Take 1 tablet (50 mg total) by mouth every 6 (six) hours as needed., Starting 06/21/2015, Until Discontinued, No Print      CONTINUE these medications which have NOT CHANGED   Details  acetaminophen (TYLENOL) 500 MG tablet Take 1,000 mg by mouth every 6 (six) hours as needed., Until Discontinued, Historical Med    amLODipine (NORVASC) 5 MG tablet take 1 tablet by mouth daily, Normal    cyclobenzaprine (FLEXERIL) 5 MG tablet Take 1 tablet (5  mg total) by mouth 3 (three) times daily as needed for muscle spasms., Starting 05/14/2015, Until Discontinued, Normal    DULoxetine (CYMBALTA) 60 MG capsule take 1 capsule by mouth once daily, Normal    furosemide (LASIX) 20 MG tablet Take 1 tablet (20 mg total) by mouth daily., Starting 04/16/2015, Until Discontinued, Normal    hydrALAZINE (APRESOLINE) 50 MG tablet take 1 tablet by mouth four times a day, Normal    levETIRAcetam (KEPPRA) 500 MG tablet take 1 tablet by mouth twice a day, Normal    losartan (COZAAR) 100 MG tablet take 1  tablet by mouth once daily, Normal    metoprolol succinate (TOPROL-XL) 50 MG 24 hr tablet Take 1 tablet (50 mg total) by mouth daily., Starting 02/27/2015, Until Discontinued, Normal    NEXIUM 40 MG capsule take 1 capsule by mouth once daily, Normal    rosuvastatin (CRESTOR) 10 MG tablet Take 1 tablet (10 mg total) by mouth daily., Starting 04/16/2015, Until Discontinued, Normal    sucralfate (CARAFATE) 1 G tablet take 1 tablet by mouth four times a day, Normal    tamsulosin (FLOMAX) 0.4 MG CAPS capsule Take 0.4 mg by mouth daily., Until Discontinued, Historical Med    VITAMIN D, CHOLECALCIFEROL, PO Take 1,000 mg by mouth daily. , Until Discontinued, Historical Med         DISCHARGE INSTRUCTIONS:   DIET:  Cardiac diet  DISCHARGE CONDITION:  Stable  ACTIVITY:  Activity as tolerated  OXYGEN:  Home Oxygen: No.   Oxygen Delivery: room air  DISCHARGE LOCATION:  home   If you experience worsening of your admission symptoms, develop shortness of breath, life threatening emergency, suicidal or homicidal thoughts you must seek medical attention immediately by calling 911 or calling your MD immediately  if symptoms less severe.  You Must read complete instructions/literature along with all the possible adverse reactions/side effects for all the Medicines you take and that have been prescribed to you. Take any new Medicines after you have completely understood and accpet all the possible adverse reactions/side effects.   Please note  You were cared for by a hospitalist during your hospital stay. If you have any questions about your discharge medications or the care you received while you were in the hospital after you are discharged, you can call the unit and asked to speak with the hospitalist on call if the hospitalist that took care of you is not available. Once you are discharged, your primary care physician will handle any further medical issues. Please note that NO REFILLS for any  discharge medications will be authorized once you are discharged, as it is imperative that you return to your primary care physician (or establish a relationship with a primary care physician if you do not have one) for your aftercare needs so that they can reassess your need for medications and monitor your lab values.     Today   No acute complaints presently. Renal function back to baseline. No nausea, vomiting, fever, chills or any other associated symptoms.  VITAL SIGNS:  Blood pressure 140/67, pulse 72, temperature 99 F (37.2 C), temperature source Oral, resp. rate 18, height 5\' 9"  (1.753 m), weight 81.647 kg (180 lb), SpO2 97 %.  I/O:   Intake/Output Summary (Last 24 hours) at 06/21/15 1603 Last data filed at 06/21/15 0930  Gross per 24 hour  Intake   5282 ml  Output    175 ml  Net   5107 ml    PHYSICAL EXAMINATION:  GENERAL:  63 y.o.-year-old obese patient lying in the bed with no acute distress.  EYES: Pupils equal, round, reactive to light and accommodation. No scleral icterus. Extraocular muscles intact.  HEENT: Head atraumatic, normocephalic. Oropharynx and nasopharynx clear.  NECK:  Supple, no jugular venous distention. No thyroid enlargement, no tenderness.  LUNGS: Normal breath sounds bilaterally, no wheezing, rales,rhonchi. No use of accessory muscles of respiration.  CARDIOVASCULAR: S1, S2 normal. No murmurs, rubs, or gallops.  ABDOMEN: Soft, non-tender, non-distended. Bowel sounds present. No organomegaly or mass.  EXTREMITIES: No pedal edema, cyanosis, or clubbing.  NEUROLOGIC: Cranial nerves II through XII are intact. No focal motor or sensory defecits b/l.  PSYCHIATRIC: The patient is alert and oriented x 3. SKIN: No obvious rash, lesion, or ulcer.   DATA REVIEW:   CBC  Recent Labs Lab 06/19/15 1449  WBC 7.8  HGB 10.7*  HCT 33.5*  PLT 163    Chemistries   Recent Labs Lab 06/19/15 1449  06/21/15 0511  NA 135  < > 142  K 4.2  < > 4.0  CL  106  < > 114*  CO2 23  < > 23  GLUCOSE 116*  < > 93  BUN 28*  < > 17  CREATININE 2.33*  < > 1.12  CALCIUM 9.0  < > 8.4*  AST 21  --   --   ALT 20  --   --   ALKPHOS 68  --   --   BILITOT 0.5  --   --   < > = values in this interval not displayed.  Cardiac Enzymes No results for input(s): TROPONINI in the last 168 hours.  RADIOLOGY:  No results found.    Management plans discussed with the patient, family and they are in agreement.  CODE STATUS:   TOTAL TIME TAKING CARE OF THIS PATIENT: 40 minutes.    Henreitta Leber M.D on 06/21/2015 at 4:03 PM  Between 7am to 6pm - Pager - [phone]  After 6pm go to [email or web address] - password [email or web address] [facility]  Office  [phone]  CC: Primary care physician; Rica Mast, MD

## 2015-06-21 NOTE — Clinical Social Work Note (Signed)
CSW asked by psychiatry to provide patient with the substance abuse counselor information for: Justin Patel: [id number]. CSW went in to patient's room to present this to patient and informed him of the counselor and his response was, "I will not go see, her." "no" Patient then added that he has seen her before and also that she does not accept his insurance. Patient was not interested in any other resources and stated he would be attending a support group through his church. Nothing further to offer at this time. Shela Leff MSW,LCSW [phone]

## 2015-06-21 NOTE — Care Management Note (Signed)
Case Management Note  Patient Details  Name: Justin Patel MRN: [id number] Date of Birth: 1950/09/26  Subjective/Objective:   No HH orders.                 Action/Plan:   Expected Discharge Date:                  Expected Discharge Plan:     In-House Referral:     Discharge planning Services     Post Acute Care Choice:    Choice offered to:     DME Arranged:    DME Agency:     HH Arranged:    [place] Agency:     Status of Service:     Medicare Important Message Given:    Date Medicare IM Given:    Medicare IM give by:    Date Additional Medicare IM Given:    Additional Medicare Important Message give by:     If discussed at [place] of Stay Meetings, dates discussed:    Additional Comments:  Jasimine Simms A, RN 06/21/2015, 9:10 AM

## 2015-06-21 NOTE — Progress Notes (Signed)
MD order received to discharge pt home today; verbally reviewed AVS with pt, no new Rxs; activity as tolerated; low sodium, heart healthy diet, pt to schedule follow up appointment with Dr Ronette Deter for 1 week; no questions voiced at this time; pt discharged via wheelchair by nursing to the visitor's entrance

## 2015-06-21 NOTE — Progress Notes (Signed)
Pt is alert and oriented x 4, no bm throughout shift, up to bathroom independently, BUN and creatinine improved, pt is d/c to home. Charge nurse Bailey Mech) reviewed d/c instructions and went over instructions with patient, pt pushed to visitor entrance via nursing staff, uneventful shift.

## 2015-06-23 ENCOUNTER — Encounter: Payer: Self-pay | Admitting: Pain Medicine

## 2015-06-23 ENCOUNTER — Ambulatory Visit: Payer: BC Managed Care – PPO | Attending: Pain Medicine | Admitting: Pain Medicine

## 2015-06-23 VITALS — BP 118/78 | HR 59 | Temp 98.4°F | Resp 18 | Ht 68.0 in | Wt 170.0 lb

## 2015-06-23 DIAGNOSIS — M2578 Osteophyte, vertebrae: Secondary | ICD-10-CM | POA: Insufficient documentation

## 2015-06-23 DIAGNOSIS — M79604 Pain in right leg: Secondary | ICD-10-CM | POA: Diagnosis present

## 2015-06-23 DIAGNOSIS — M47816 Spondylosis without myelopathy or radiculopathy, lumbar region: Secondary | ICD-10-CM | POA: Insufficient documentation

## 2015-06-23 DIAGNOSIS — M545 Low back pain: Secondary | ICD-10-CM | POA: Diagnosis present

## 2015-06-23 DIAGNOSIS — M5136 Other intervertebral disc degeneration, lumbar region: Secondary | ICD-10-CM | POA: Diagnosis not present

## 2015-06-23 DIAGNOSIS — M533 Sacrococcygeal disorders, not elsewhere classified: Secondary | ICD-10-CM

## 2015-06-23 DIAGNOSIS — M79605 Pain in left leg: Secondary | ICD-10-CM | POA: Diagnosis present

## 2015-06-23 DIAGNOSIS — M5416 Radiculopathy, lumbar region: Secondary | ICD-10-CM

## 2015-06-23 DIAGNOSIS — Q761 Klippel-Feil syndrome: Secondary | ICD-10-CM

## 2015-06-23 MED ORDER — MIDAZOLAM HCL 5 MG/5ML IJ SOLN: 5.0000 mg | Freq: Once | INTRAMUSCULAR | Status: DC

## 2015-06-23 MED ORDER — ORPHENADRINE CITRATE 30 MG/ML IJ SOLN: 60.0000 mg | Freq: Once | INTRAMUSCULAR | Status: DC

## 2015-06-23 MED ORDER — CEFUROXIME AXETIL 250 MG PO TABS: 250.0000 mg | ORAL_TABLET | Freq: Two times a day (BID) | ORAL | Status: DC

## 2015-06-23 MED ORDER — LACTATED RINGERS IV SOLN
1000.0000 mL | INTRAVENOUS | Status: DC
Start: 2015-06-23 — End: 2015-06-25

## 2015-06-23 MED ORDER — ORPHENADRINE CITRATE 30 MG/ML IJ SOLN
INTRAMUSCULAR | Status: AC
  Filled 2015-06-23: qty 2

## 2015-06-23 MED ORDER — FENTANYL CITRATE (PF) 100 MCG/2ML IJ SOLN: 100.0000 ug | Freq: Once | INTRAMUSCULAR | Status: DC

## 2015-06-23 MED ORDER — CEFAZOLIN SODIUM 1 G IJ SOLR
INTRAMUSCULAR | Status: AC
  Administered 2015-06-23: 1 g
  Filled 2015-06-23: qty 10

## 2015-06-23 MED ORDER — BUPIVACAINE HCL (PF) 0.25 % IJ SOLN: 30.0000 mL | Freq: Once | INTRAMUSCULAR | Status: DC

## 2015-06-23 MED ORDER — FENTANYL CITRATE (PF) 100 MCG/2ML IJ SOLN
INTRAMUSCULAR | Status: AC
  Administered 2015-06-23: 100 ug via INTRAVENOUS
  Filled 2015-06-23: qty 2

## 2015-06-23 MED ORDER — TRIAMCINOLONE ACETONIDE 40 MG/ML IJ SUSP: 40.0000 mg | Freq: Once | INTRAMUSCULAR | Status: DC

## 2015-06-23 MED ORDER — TRIAMCINOLONE ACETONIDE 40 MG/ML IJ SUSP
INTRAMUSCULAR | Status: AC
  Administered 2015-06-23: 14:00:00
  Filled 2015-06-23: qty 1

## 2015-06-23 MED ORDER — BUPIVACAINE HCL (PF) 0.25 % IJ SOLN
INTRAMUSCULAR | Status: AC
  Administered 2015-06-23: 14:00:00
  Filled 2015-06-23: qty 30

## 2015-06-23 MED ORDER — CEFAZOLIN SODIUM 1-5 GM-% IV SOLN: 1.0000 g | Freq: Once | INTRAVENOUS | Status: DC

## 2015-06-23 MED ORDER — MIDAZOLAM HCL 5 MG/5ML IJ SOLN
INTRAMUSCULAR | Status: AC
  Administered 2015-06-23: 5 mg via INTRAVENOUS
  Filled 2015-06-23: qty 5

## 2015-06-23 NOTE — Progress Notes (Signed)
Safety precautions to be maintained throughout the outpatient stay will include: orient to surroundings, keep bed in low position, maintain call bell within reach at all times, provide assistance with transfer out of bed and ambulation.  

## 2015-06-23 NOTE — Progress Notes (Signed)
Subjective:    Patient ID: Justin Patel, male    DOB: April 06, 1951, 64 y.o.   MRN: [id number]  HPI  PROCEDURE PERFORMED: Lumbar facet (medial branch block)   NOTE: The patient is a 64 y.o. male who returns to [place] for further evaluation and treatment of pain involving the lumbar and lower extremity region. 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 Lateral recess narrowing at L3-4 through L4-5. 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 The risks, benefits, and expectations of the procedure have been discussed and explained to the patient who was understanding and in agreement with suggested treatment plan. We will proceed with interventional treatment as discussed and as explained to the patient who was understanding and wished to proceed with procedure as planned.   DESCRIPTION OF PROCEDURE: Lumbar facet (medial branch block) with IV Versed, IV fentanyl conscious sedation, EKG, blood pressure, pulse, and pulse oximetry monitoring. The procedure was performed with the patient in the prone position. Betadine prep of proposed entry site performed.   NEEDLE PLACEMENT AT: Left L 3 lumbar facet (medial branch block). Under fluoroscopic guidance with oblique orientation of 15 degrees, a 22-gauge needle was inserted at the L 3 vertebral body level with needle placed at the targeted area of Burton's Eye or Eye of the Scotty Dog with documentation of needle placement in the superior and lateral border of targeted area of Burton's Eye or Eye of the Scotty Dog with oblique orientation of 15 degrees. Following documentation of needle placement at the L 3 vertebral body level, needle placement was then accomplished at the  L 4 vertebral body level.   NEEDLE PLACEMENT AT L4 and L5 VERTEBRAL BODY LEVELS ON THE LEFT SIDE The procedure was performed at the L4 and L5 vertebral body levels exactly as was performed at the L 3 vertebral body level utilizing the same technique and under fluoroscopic guidance.  NEEDLE PLACEMENT AT THE SACRAL ALA with AP view of the lumbosacral spine. With the patient in the prone position, Betadine prep of proposed entry site accomplished, a 22 gauge needle was inserted in the region of the sacral ala (groove formed by the superior articulating process of S1 and the sacral wing). Following documentation of needle placement at the sacral ala,  needle placement was then accomplished at the S1 foramen level.   NEEDLE PLACEMENT AT THE S1 FORAMEN LEVEL under fluoroscopic guidance with AP view of the lumbosacral spine and cephalad orientation of the fluoroscope, a 22-gauge needle was placed at the superior and lateral border of the S1 foramen under fluoroscopic guidance. Following documentation of needle placement at the S1 foramen.   Needle placement was then verified at all levels on lateral view. Following documentation of needle placement at all levels on lateral view and following negative aspiration for heme and CSF, each level was injected with 1 mL of 0.25% bupivacaine with Kenalog.     LUMBAR FACET, MEDIAL BRANCH NERVE, BLOCKS PERFORMED ON THE RIGHT SIDE   The procedure was performed on the right side exactly as was performed on the left side at the same levels and utilizing the same technique under fluoroscopic guidance.     The patient tolerated the procedure well. A total of 40 mg of Kenalog was  utilized for the procedure.   PLAN:  1. Medications: The patient will continue presently prescribed medications. 2. May consider modification of treatment regimen at time of return appointment pending response to treatment rendered on today's visit. 3. The patient is to follow-up with  primary care physician  Dr. Gilford Rile for further evaluation of blood pressure and general medical condition status post steroid injection performed on today's visit. 4. Surgical follow-up evaluation. Patient prefers to avoid at this time 5. Neurological follow-up evaluation. May consider [facility] EMG studies 6. The patient may be candidate for radiofrequency procedures, implantation type procedures, and other treatment pending response to treatment and follow-up evaluation. 7. The patient has been advised to call the [facility] Management Center prior to scheduled return appointment should there be significant change in condition or should patient have other concerns regarding condition prior to scheduled return appointment.  The patient is understanding and in agreement with suggested treatment plan.     Review of Systems     Objective:   Physical Exam        Assessment & Plan:

## 2015-06-23 NOTE — Patient Instructions (Addendum)
PLAN    Continue present medications and please obtain Ceftin antibiotic today and begin taking Ceftin antibiotic as prescribed   F/U PCP Dr. Gilford Rile for evaliation of  BP and general medical  condition  F/U surgical evaluation. May consider pending follow-up evaluations  F/U neurological evaluation. May consider pending follow-up evaluations  May consider radiofrequency rhizolysis or intraspinal procedures pending response to present treatment and F/U evaluation   Patient to call Pain Management Center should patient have concerns prior to scheduled return appointment.Facet Joint Block The facet joints connect the bones of the spine (vertebrae). They make it possible for you to bend, twist, and make other movements with your spine. They also prevent you from overbending, overtwisting, and making other excessive movements.  A facet joint block is a procedure where a numbing medicine (anesthetic) is injected into a facet joint. Often, a type of anti-inflammatory medicine called a steroid is also injected. A facet joint block may be done for two reasons:   Diagnosis. A facet joint block may be done as a test to see whether neck or back pain is caused by a worn-down or infected facet joint. If the pain gets better after a facet joint block, it means the pain is probably coming from the facet joint. If the pain does not get better, it means the pain is probably not coming from the facet joint.   Therapy. A facet joint block may be done to relieve neck or back pain caused by a facet joint. A facet joint block is only done as a therapy if the pain does not improve with medicine, exercise programs, physical therapy, and other forms of pain management. LET [facility] CARE PROVIDER KNOW ABOUT:   Any allergies you have.   All medicines you are taking, including vitamins, herbs, eyedrops, and over-the-counter medicines and creams.   Previous problems you or members of your family have had with  the use of anesthetics.   Any blood disorders you have had.   Other health problems you have. RISKS AND COMPLICATIONS Generally, having a facet joint block is safe. However, as with any procedure, complications can occur. Possible complications associated with having a facet joint block include:   Bleeding.   Injury to a nerve near the injection site.   Pain at the injection site.   Weakness or numbness in areas controlled by nerves near the injection site.   Infection.   Temporary fluid retention.   Allergic reaction to anesthetics or medicines used during the procedure. BEFORE THE PROCEDURE   Follow your health care provider's instructions if you are taking dietary supplements or medicines. You may need to stop taking them or reduce your dosage.   Do not take any new dietary supplements or medicines without asking your health care provider first.   Follow your health care provider's instructions about eating and drinking before the procedure. You may need to stop eating and drinking several hours before the procedure.   Arrange to have an adult drive you home after the procedure. PROCEDURE  You may need to remove your clothing and dress in an open-back gown so that your health care provider can access your spine.   The procedure will be done while you are lying on an X-ray table. Most of the time you will be asked to lie on your stomach, but you may be asked to lie in a different position if an injection will be made in your neck.   Special machines will be  used to monitor your oxygen levels, heart rate, and blood pressure.   If an injection will be made in your neck, an intravenous (IV) tube will be inserted into one of your veins. Fluids and medicine will flow directly into your body through the IV tube.   The area over the facet joint where the injection will be made will be cleaned with an antiseptic soap. The surrounding skin will be covered with sterile  drapes.   An anesthetic will be applied to your skin to make the injection area numb. You may feel a temporary stinging or burning sensation.   A video X-ray machine will be used to locate the joint. A contrast dye may be injected into the facet joint area to help with locating the joint.   When the joint is located, an anesthetic medicine will be injected into the joint through the needle.   Your health care provider will ask you whether you feel pain relief. If you do feel relief, a steroid may be injected to provide pain relief for a longer period of time. If you do not feel relief or feel only partial relief, additional injections of an anesthetic may be made in other facet joints.   The needle will be removed, the skin will be cleansed, and bandages will be applied.  AFTER THE PROCEDURE   You will be observed for 15-30 minutes before being allowed to go home. Do not drive. Have an adult drive you or take a taxi or public transportation instead.   If you feel pain relief, the pain will return in several hours or days when the anesthetic wears off.   You may feel pain relief 2-14 days after the procedure. The amount of time this relief lasts varies from person to person.   It is normal to feel some tenderness over the injected area(s) for 2 days following the procedure.   If you have diabetes, you may have a temporary increase in blood sugar.   This information is not intended to replace advice given to you by your health care provider. Make sure you discuss any questions you have with your health care provider.   Document Released: 12/15/2006 Document Revised: 08/16/2014 Document Reviewed: 05/15/2012 Elsevier Interactive Patient Education 2016 [employer] Pain Management Discharge Instructions  General Discharge Instructions :  If you need to reach your doctor call: Monday-Friday 8:00 am - 4:00 pm at [phone] or toll free [phone].  After clinic hours  [phone] to have operator reach doctor.  Bring all of your medication bottles to all your appointments in the pain clinic.  To cancel or reschedule your appointment with Pain Management please remember to call 24 hours in advance to avoid a fee.  Refer to the educational materials which you have been given on: General Risks, I had my Procedure. Discharge Instructions, Post Sedation.  Post Procedure Instructions:  The drugs you were given will stay in your system until tomorrow, so for the next 24 hours you should not drive, make any legal decisions or drink any alcoholic beverages.  You may eat anything you prefer, but it is better to start with liquids then soups and crackers, and gradually work up to solid foods.  Please notify your doctor immediately if you have any unusual bleeding, trouble breathing or pain that is not related to your normal pain.  Depending on the type of procedure that was done, some parts of your body may feel week and/or numb.  This usually clears up  by tonight or the next day.  Walk with the use of an assistive device or accompanied by an adult for the 24 hours.  You may use ice on the affected area for the first 24 hours.  Put ice in a Ziploc bag and cover with a towel and place against area 15 minutes on 15 minutes off.  You may switch to heat after 24 hours.

## 2015-06-24 ENCOUNTER — Telehealth: Payer: Self-pay | Admitting: *Deleted

## 2015-06-24 NOTE — Telephone Encounter (Signed)
Patient has discharged from the hospital,06/21/15 please advise a place to patient, pateint requested no appt's the week of thanksgiving.

## 2015-06-24 NOTE — Telephone Encounter (Signed)
Denies complications post procedure. 

## 2015-06-24 NOTE — Telephone Encounter (Signed)
Availability on 06/25/15 at 3:30 -4.  HFU 30 minutes.   Does not qualify for TCM.

## 2015-06-25 ENCOUNTER — Encounter: Payer: Self-pay | Admitting: Internal Medicine

## 2015-06-25 ENCOUNTER — Ambulatory Visit (INDEPENDENT_AMBULATORY_CARE_PROVIDER_SITE_OTHER): Payer: BC Managed Care – PPO | Admitting: Internal Medicine

## 2015-06-25 VITALS — BP 152/80 | HR 64 | Temp 98.6°F | Ht 67.0 in | Wt 190.5 lb

## 2015-06-25 DIAGNOSIS — F1110 Opioid abuse, uncomplicated: Secondary | ICD-10-CM | POA: Diagnosis not present

## 2015-06-25 DIAGNOSIS — F1010 Alcohol abuse, uncomplicated: Secondary | ICD-10-CM

## 2015-06-25 DIAGNOSIS — R600 Localized edema: Secondary | ICD-10-CM | POA: Diagnosis not present

## 2015-06-25 DIAGNOSIS — N179 Acute kidney failure, unspecified: Secondary | ICD-10-CM

## 2015-06-25 MED ORDER — FUROSEMIDE 20 MG PO TABS: 20.0000 mg | ORAL_TABLET | Freq: Every day | ORAL | Status: DC

## 2015-06-25 MED ORDER — CYCLOBENZAPRINE HCL 5 MG PO TABS: 5.0000 mg | ORAL_TABLET | Freq: Three times a day (TID) | ORAL | Status: DC | PRN

## 2015-06-25 NOTE — Assessment & Plan Note (Signed)
Recent admission for ARF, dehydration and confusion in setting of both alcohol and Tramadol abuse. Will repeat renal function with labs today.

## 2015-06-25 NOTE — Assessment & Plan Note (Signed)
He denies excessive alcohol use. However has been in treatment in the past. We discussed risk of death if he continues to use alcohol given previous multiple head injuries, GI bleeding. Will check ETOH level today with bloodwork. He states he is unable to perform a urine sample.

## 2015-06-25 NOTE — Patient Instructions (Signed)
Labs today.  Follow up in 4 weeks and sooner as needed.

## 2015-06-25 NOTE — Assessment & Plan Note (Signed)
Unclear why he was prescribed 180 Tramadol from [facility] discharge given history of alcohol and opiate abuse. If he would like to continue pain medication from our office in the future, he will have to have UDS at each visit, negative for alcohol.

## 2015-06-25 NOTE — Progress Notes (Signed)
Subjective:    Patient ID: Justin Patel, male    DOB: [birth year], 65 y.o.   MRN: [id number]  HPI  64YO male presents for hospital follow up.  ADMITTED: 06/19/2015 DISCHARGED: 06/21/2015  DIAGNOSIS: Acute kidney injury, Alcohol abuse, Opiate abuse  Note on discharge that pt was given an Rx for #180 Tramadol with 2 refills by Dr. Verdell Carmine.  Pt denies excessive alcohol use however has participated in substance abuse treatment in the past. He plans to participate in a church group to help with use of alcohol to "appease his wife." He denies any concerns today.   Wt Readings from Last 3 Encounters:  06/25/15 190 lb 8 oz (86.41 kg)  06/23/15 170 lb (77.111 kg)  06/19/15 180 lb (81.647 kg)   BP Readings from Last 3 Encounters:  06/25/15 152/80  06/23/15 118/78  06/21/15 140/67    Past Medical History  Diagnosis Date  . Hypertension   . Anxiety   . Sleep apnea     hx not now since wt loss  . Diabetes mellitus   . Stones in the urinary tract   . GERD (gastroesophageal reflux disease)   . H/O hiatal hernia   . Headache(784.0)   . Arthritis   . 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 Gastric ulcer   . Degenerative disc disease   . Sacral fracture, closed ([place])   . Syncope and collapse   . Chronic kidney disease   . Heart murmur   . Hyperlipidemia   . Hx of congestive heart failure   . Iron deficiency anemia 02/28/2015   Family History  Problem Relation Age of Onset  . Dementia Mother 67  . Heart disease Father 38  . Diabetes Father   . Lymphoma Sister     lymphoma, stage 4  . Ovarian cancer Sister 40    Ovarian  . Lupus Sister    Past Surgical History  Procedure Laterality Date  . Hernia repair  2000    hiatal  . Gastric bypass  2010    [facility]  . Knee arthroscopy      bilateral, left x 2  . Osteotomy  2001    left  . Tonsillectomy    . Appendectomy    . Nasal sinus surgery      x5  . Uvulopalatoplasty  2011  . Anterior  cervical decomp/discectomy fusion  01/26/2012    Procedure: ANTERIOR CERVICAL DECOMPRESSION/DISCECTOMY FUSION 3 LEVELS;  Surgeon: Floyce Stakes, MD;  Location: [facility];  Service: Neurosurgery;  Laterality: N/A;  Cervical three-four Cervical four-five Cervical five-six Cervical six-seven , Anterior cervical decompression/diskectomy, fusion, plate  . Total knee arthroplasty Left 05/2014    Dr. Marry Guan  . Cardiac catheterization      [facility], normal  . Cardiac catheterization      [facility]  . Joint replacement     Social History   Social History  . Marital Status: Married    Spouse Name: N/A  . Number of Children: N/A  . Years of Education: N/A   Social History Main Topics  . Smoking status: Never Smoker   . Smokeless tobacco: Never Used  . Alcohol Use: 0.6 oz/week    1 Glasses of wine per week     Comment: occasional  . Drug Use: No  . Sexual Activity:    Partners: Female   Other Topics Concern  . None   Social History Narrative  Lives in [place] with wife, [place]. 2 sons, William Hamburger, Lennette Bihari 66.. 4 grandchildren      Work - Retired, previously taught music in [place] - regular diet, limited quantities after gastric bypass      Exercise - no regular, limited by arthritis in knees, occasional water aerobics    Review of Systems  Constitutional: Negative for fever, chills, activity change, appetite change, fatigue and unexpected weight change.  Eyes: Negative for visual disturbance.  Respiratory: Negative for cough and shortness of breath.   Cardiovascular: Negative for chest pain, palpitations and leg swelling.  Gastrointestinal: Negative for abdominal pain and abdominal distention.  Genitourinary: Negative for dysuria, urgency and difficulty urinating.  Musculoskeletal: Negative for arthralgias and gait problem.  Skin: Negative for color change and rash.  Hematological: Negative for adenopathy.  Psychiatric/Behavioral:  Negative for suicidal ideas, sleep disturbance and dysphoric mood. The patient is not nervous/anxious.        Objective:    BP 152/80 mmHg  Pulse 64  Temp(Src) 98.6 F (37 C) (Oral)  Ht 5\' 7"  (1.702 m)  Wt 190 lb 8 oz (86.41 kg)  BMI 29.83 kg/m2  SpO2 97% Physical Exam  Constitutional: He is oriented to person, place, and time. He appears well-developed and well-nourished. No distress.  HENT:  Head: Normocephalic and atraumatic.  Right Ear: External ear normal.  Left Ear: External ear normal.  Nose: Nose normal.  Mouth/Throat: Oropharynx is clear and moist. No oropharyngeal exudate.  Eyes: Conjunctivae and EOM are normal. Pupils are equal, round, and reactive to light. Right eye exhibits no discharge. Left eye exhibits no discharge. No scleral icterus.  Neck: Normal range of motion. Neck supple. No tracheal deviation present. No thyromegaly present.  Cardiovascular: Normal rate, regular rhythm and normal heart sounds.  Exam reveals no gallop and no friction rub.   No murmur heard. Pulmonary/Chest: Effort normal and breath sounds normal. No accessory muscle usage. No tachypnea. No respiratory distress. He has no decreased breath sounds. He has no wheezes. He has no rhonchi. He has no rales. He exhibits no tenderness.  Musculoskeletal: Normal range of motion. He exhibits no edema.  Lymphadenopathy:    He has no cervical adenopathy.  Neurological: He is alert and oriented to person, place, and time. No cranial nerve deficit. Coordination normal.  Skin: Skin is warm and dry. No rash noted. He is not diaphoretic. No erythema. No pallor.  Psychiatric: His speech is normal and behavior is normal. Judgment and thought content normal. His mood appears anxious.          Assessment & Plan:   Problem List Items Addressed This Visit      Unprioritized   Acute renal failure (ARF) ([place]) - Primary    Recent admission for ARF, dehydration and confusion in setting of both alcohol and  Tramadol abuse. Will repeat renal function with labs today.      Relevant Orders   Comprehensive metabolic panel   CBC w/Diff   POCT Urinalysis Dipstick   Alcohol abuse    He denies excessive alcohol use. However has been in treatment in the past. We discussed risk of death if he continues to use alcohol given previous multiple head injuries, GI bleeding. Will check ETOH level today with bloodwork. He states he is unable to perform a urine sample.      Opiate abuse, continuous    Unclear why he was prescribed 180 Tramadol from [facility] discharge given  history of alcohol and opiate abuse. If he would like to continue pain medication from our office in the future, he will have to have UDS at each visit, negative for alcohol.       Other Visit Diagnoses    Bilateral edema of lower extremity        Relevant Medications    furosemide (LASIX) 20 MG tablet        Return in about 4 weeks (around 07/23/2015).

## 2015-06-26 ENCOUNTER — Other Ambulatory Visit: Payer: Self-pay | Admitting: Internal Medicine

## 2015-06-26 LAB — CBC WITH DIFFERENTIAL/PLATELET
BASOS PCT: 0.3 % (ref 0.0–3.0)
Basophils Absolute: 0 10*3/uL (ref 0.0–0.1)
EOS PCT: 0.2 % (ref 0.0–5.0)
Eosinophils Absolute: 0 10*3/uL (ref 0.0–0.7)
HEMATOCRIT: 35.6 % — AB (ref 39.0–52.0)
HEMOGLOBIN: 11.9 g/dL — AB (ref 13.0–17.0)
LYMPHS PCT: 15.2 % (ref 12.0–46.0)
Lymphs Abs: 1.3 10*3/uL (ref 0.7–4.0)
MCHC: 33.3 g/dL (ref 30.0–36.0)
MCV: 98.4 fl (ref 78.0–100.0)
MONOS PCT: 6.6 % (ref 3.0–12.0)
Monocytes Absolute: 0.6 10*3/uL (ref 0.1–1.0)
NEUTROS ABS: 6.7 10*3/uL (ref 1.4–7.7)
Neutrophils Relative %: 77.7 % — ABNORMAL HIGH (ref 43.0–77.0)
PLATELETS: 206 10*3/uL (ref 150.0–400.0)
RBC: 3.62 Mil/uL — ABNORMAL LOW (ref 4.22–5.81)
RDW: 14.8 % (ref 11.5–15.5)
WBC: 8.6 10*3/uL (ref 4.0–10.5)

## 2015-06-26 LAB — COMPREHENSIVE METABOLIC PANEL
ALT: 16 U/L (ref 0–53)
AST: 18 U/L (ref 0–37)
Albumin: 3.9 g/dL (ref 3.5–5.2)
Alkaline Phosphatase: 62 U/L (ref 39–117)
BUN: 13 mg/dL (ref 6–23)
CALCIUM: 9.8 mg/dL (ref 8.4–10.5)
CHLORIDE: 102 meq/L (ref 96–112)
CO2: 24 meq/L (ref 19–32)
CREATININE: 1.03 mg/dL (ref 0.40–1.50)
GFR: 77.18 mL/min (ref >60.00)
Glucose, Bld: 106 mg/dL — ABNORMAL HIGH (ref 70–99)
POTASSIUM: 3.6 meq/L (ref 3.5–5.1)
Sodium: 138 mEq/L (ref 135–145)
Total Bilirubin: 0.4 mg/dL (ref 0.2–1.2)
Total Protein: 6.7 g/dL (ref 6.0–8.3)

## 2015-06-30 ENCOUNTER — Other Ambulatory Visit: Payer: Self-pay | Admitting: Pain Medicine

## 2015-07-09 ENCOUNTER — Emergency Department: Payer: BC Managed Care – PPO

## 2015-07-09 ENCOUNTER — Encounter: Payer: Self-pay | Admitting: *Deleted

## 2015-07-09 ENCOUNTER — Emergency Department
Admission: EM | Admit: 2015-07-09 | Discharge: 2015-07-09 | Disposition: A | Discharge status: Home / Self Care | Payer: BC Managed Care – PPO | Attending: Emergency Medicine | Admitting: Emergency Medicine

## 2015-07-09 DIAGNOSIS — S199XXA Unspecified injury of neck, initial encounter: Secondary | ICD-10-CM | POA: Insufficient documentation

## 2015-07-09 DIAGNOSIS — E119 Type 2 diabetes mellitus without complications: Secondary | ICD-10-CM | POA: Insufficient documentation

## 2015-07-09 DIAGNOSIS — M542 Cervicalgia: Secondary | ICD-10-CM

## 2015-07-09 DIAGNOSIS — N189 Chronic kidney disease, unspecified: Secondary | ICD-10-CM | POA: Insufficient documentation

## 2015-07-09 DIAGNOSIS — I129 Hypertensive chronic kidney disease with stage 1 through stage 4 chronic kidney disease, or unspecified chronic kidney disease: Secondary | ICD-10-CM | POA: Diagnosis not present

## 2015-07-09 DIAGNOSIS — S0101XA Laceration without foreign body of scalp, initial encounter: Secondary | ICD-10-CM | POA: Insufficient documentation

## 2015-07-09 DIAGNOSIS — W01198A Fall on same level from slipping, tripping and stumbling with subsequent striking against other object, initial encounter: Secondary | ICD-10-CM | POA: Diagnosis not present

## 2015-07-09 DIAGNOSIS — S0990XA Unspecified injury of head, initial encounter: Secondary | ICD-10-CM

## 2015-07-09 DIAGNOSIS — Y998 Other external cause status: Secondary | ICD-10-CM | POA: Insufficient documentation

## 2015-07-09 DIAGNOSIS — Y9389 Activity, other specified: Secondary | ICD-10-CM | POA: Insufficient documentation

## 2015-07-09 DIAGNOSIS — Y9289 Other specified places as the place of occurrence of the external cause: Secondary | ICD-10-CM | POA: Insufficient documentation

## 2015-07-09 DIAGNOSIS — R0602 Shortness of breath: Secondary | ICD-10-CM | POA: Diagnosis not present

## 2015-07-09 DIAGNOSIS — T148XXA Other injury of unspecified body region, initial encounter: Secondary | ICD-10-CM

## 2015-07-09 DIAGNOSIS — Z792 Long term (current) use of antibiotics: Secondary | ICD-10-CM | POA: Insufficient documentation

## 2015-07-09 DIAGNOSIS — Z79899 Other long term (current) drug therapy: Secondary | ICD-10-CM | POA: Diagnosis not present

## 2015-07-09 DIAGNOSIS — S2232XA Fracture of one rib, left side, initial encounter for closed fracture: Secondary | ICD-10-CM | POA: Diagnosis not present

## 2015-07-09 DIAGNOSIS — S299XXA Unspecified injury of thorax, initial encounter: Secondary | ICD-10-CM | POA: Diagnosis present

## 2015-07-09 DIAGNOSIS — R55 Syncope and collapse: Secondary | ICD-10-CM

## 2015-07-09 LAB — CBC
HEMATOCRIT: 36.4 % — AB (ref 40.0–52.0)
Hemoglobin: 11.9 g/dL — ABNORMAL LOW (ref 13.0–18.0)
MCH: 32.4 pg (ref 26.0–34.0)
MCHC: 32.7 g/dL (ref 32.0–36.0)
MCV: 99.2 fL (ref 80.0–100.0)
Platelets: 219 10*3/uL (ref 150–440)
RBC: 3.67 MIL/uL — AB (ref 4.40–5.90)
RDW: 14.9 % — ABNORMAL HIGH (ref 11.5–14.5)
WBC: 14.8 10*3/uL — AB (ref 3.8–10.6)

## 2015-07-09 LAB — BASIC METABOLIC PANEL
ANION GAP: 10 (ref 5–15)
BUN: 32 mg/dL — ABNORMAL HIGH (ref 6–20)
CO2: 19 mmol/L — AB (ref 22–32)
Calcium: 8.8 mg/dL — ABNORMAL LOW (ref 8.9–10.3)
Chloride: 109 mmol/L (ref 101–111)
Creatinine, Ser: 1.95 mg/dL — ABNORMAL HIGH (ref 0.61–1.24)
GFR, EST AFRICAN AMERICAN: 40 mL/min — AB (ref >60)
GFR, EST NON AFRICAN AMERICAN: 35 mL/min — AB (ref >60)
GLUCOSE: 137 mg/dL — AB (ref 65–99)
POTASSIUM: 4.2 mmol/L (ref 3.5–5.1)
Sodium: 138 mmol/L (ref 135–145)

## 2015-07-09 LAB — TROPONIN I: Troponin I: <0.03 ng/mL (ref <0.031)

## 2015-07-09 LAB — ETHANOL: ALCOHOL ETHYL (B): 8 mg/dL — AB (ref <5)

## 2015-07-09 MED ORDER — ONDANSETRON HCL 4 MG/2ML IJ SOLN
4.0000 mg | Freq: Once | INTRAMUSCULAR | Status: AC
  Administered 2015-07-09: 4 mg via INTRAVENOUS
  Filled 2015-07-09: qty 2

## 2015-07-09 MED ORDER — MORPHINE SULFATE (PF) 4 MG/ML IV SOLN
4.0000 mg | Freq: Once | INTRAVENOUS | Status: AC
  Administered 2015-07-09: 4 mg via INTRAVENOUS
  Filled 2015-07-09: qty 1

## 2015-07-09 MED ORDER — LIDOCAINE-EPINEPHRINE-TETRACAINE (LET) SOLUTION
3.0000 mL | Freq: Once | NASAL | Status: AC
  Administered 2015-07-09: 3 mL via TOPICAL

## 2015-07-09 MED ORDER — OXYCODONE-ACETAMINOPHEN 5-325 MG PO TABS
2.0000 | ORAL_TABLET | Freq: Once | ORAL | Status: AC
  Administered 2015-07-09: 2 via ORAL
  Filled 2015-07-09: qty 2

## 2015-07-09 MED ORDER — OXYCODONE-ACETAMINOPHEN 5-325 MG PO TABS: 1.0000 | ORAL_TABLET | Freq: Four times a day (QID) | ORAL | Status: DC | PRN

## 2015-07-09 MED ORDER — LIDOCAINE-EPINEPHRINE-TETRACAINE (LET) SOLUTION
NASAL | Status: AC
  Administered 2015-07-09: 3 mL via TOPICAL
  Filled 2015-07-09: qty 3

## 2015-07-09 NOTE — ED Provider Notes (Signed)
[facility] Emergency Department Provider Note  ____________________________________________  Time seen: Approximately 110 AM  I have reviewed the triage vital signs and the nursing notes.   HISTORY  Chief Complaint Fall    HPI Justin Patel is a 64 y.o. male who comes into the hospital with the fall. The patient reports that he fell at 5 PM. He reports he was getting into the fridge and fell to his left. When he fell he hit the baseboard in the left side of his chest. The patient reports that he got up and was having pain in the base of his neck and his left side. The patient has a history of C-spine surgery and was concerned. He reports he also has a cut to his head when he stayed home where he felt the symptoms would improve. After multiple hours since the symptoms did not improve the patient decided to come in for evaluation. The patient reports that he had a glass of wine prior to the fall. According to the patient's wife he had 2 bottles of wine.The patient reports that he does not fully remember the fall but does remember getting off the floor. Patient reports that the pain is on the left side of his chest and is having difficulty breathing due to the pain. The patient reports that he took some Tylenol but the pain did not get any better so he decided to come in to the hospital. The patient does have a history of chronic neck pain and back pain as well and sees Dr. crisp at the pain clinic.   Past Medical History  Diagnosis Date  . Hypertension   . Anxiety   . Sleep apnea     hx not now since wt loss  . Diabetes mellitus   . Stones in the urinary tract   . GERD (gastroesophageal reflux disease)   . H/O hiatal hernia   . Headache(784.0)   . Arthritis   . 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 Gastric ulcer   . Degenerative disc disease   . Sacral fracture, closed ([place])   . Syncope and collapse   . Chronic kidney disease   . Heart  murmur   . Hyperlipidemia   . Hx of congestive heart failure   . Iron deficiency anemia 02/28/2015    Patient Active Problem List   Diagnosis Date Noted  . Opiate abuse, continuous 06/20/2015  . Acute renal failure (ARF) ([place]) 06/19/2015  . Chronic pain syndrome 04/16/2015  . Iron deficiency anemia 02/28/2015  . Leg edema, right 11/12/2014  . Alcohol abuse 10/01/2014  . Intracerebral hemorrhage ([place]) 06/04/2014  . Brain bleed ([place]) 06/04/2014  . SDH (subdural hematoma) ([place]) 06/03/2014  . Syncope 06/03/2014  . Fall 06/03/2014  . 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 Left knee pain 02/04/2014  . Gonalgia 02/04/2014  . Benign prostatic hyperplasia with urinary obstruction 11/16/2013  . Enlarged prostate 11/08/2013  . Anemia 11/08/2013  . Hypovitaminosis D 10/11/2013  . Fracture of metatarsal bone of right foot 10/11/2013  . Fracture of metatarsal 10/11/2013  . Avitaminosis D 10/11/2013  . H/O urinary stone 10/05/2013  . Urinary hesitancy 08/27/2013  . Renal cyst 08/27/2013  . Upper GI bleed 08/20/2013  . Gastrointestinal bleeding, upper 08/20/2013  . Essential hypertension, benign 03/21/2013  . Other and unspecified hyperlipidemia 03/21/2013  . Obesity, unspecified 03/21/2013  . Adiposity 03/21/2013  .  Benign essential HTN 03/21/2013  . HLD (hyperlipidemia) 03/21/2013    Past Surgical History  Procedure Laterality Date  . Hernia repair  2000    hiatal  . Gastric bypass  2010    [place]  . Knee arthroscopy      bilateral, left x 2  . Osteotomy  2001    left  . Tonsillectomy    . Appendectomy    . Nasal sinus surgery      x5  . Uvulopalatoplasty  2011  . Anterior cervical decomp/discectomy fusion  01/26/2012    Procedure: ANTERIOR CERVICAL DECOMPRESSION/DISCECTOMY FUSION 3 LEVELS;  Surgeon: Floyce Stakes, MD;  Location: [facility];  Service: Neurosurgery;  Laterality: N/A;  Cervical three-four  Cervical four-five Cervical five-six Cervical six-seven , Anterior cervical decompression/diskectomy, fusion, plate  . Total knee arthroplasty Left 05/2014    Dr. Marry Guan  . Cardiac catheterization      [facility], normal  . Cardiac catheterization      [place]  . Joint replacement      Current Outpatient Rx  Name  Route  Sig  Dispense  Refill  . acetaminophen (TYLENOL) 500 MG tablet   Oral   Take 1,000 mg by mouth every 6 (six) hours as needed.         Marland Kitchen amLODipine (NORVASC) 5 MG tablet      take 1 tablet by mouth daily   90 tablet   3   . cefUROXime (CEFTIN) 250 MG tablet   Oral   Take 1 tablet (250 mg total) by mouth 2 (two) times daily with a meal.   14 tablet   0   . cyclobenzaprine (FLEXERIL) 5 MG tablet   Oral   Take 1 tablet (5 mg total) by mouth 3 (three) times daily as needed for muscle spasms.   30 tablet   1   . DULoxetine (CYMBALTA) 60 MG capsule      take 1 capsule by mouth once daily   30 capsule   3   . furosemide (LASIX) 20 MG tablet   Oral   Take 1 tablet (20 mg total) by mouth daily.   30 tablet   3   . hydrALAZINE (APRESOLINE) 50 MG tablet      take 1 tablet by mouth four times a day   120 tablet   5   . levETIRAcetam (KEPPRA) 500 MG tablet      take 1 tablet by mouth twice a day   60 tablet   5   . losartan (COZAAR) 100 MG tablet      take 1 tablet by mouth once daily   30 tablet   5   . metoprolol succinate (TOPROL-XL) 50 MG 24 hr tablet   Oral   Take 1 tablet (50 mg total) by mouth daily.   30 tablet   6   . NEXIUM 40 MG capsule      take 1 capsule by mouth once daily   30 capsule   10   . oxyCODONE-acetaminophen (ROXICET) 5-325 MG tablet   Oral   Take 1-2 tablets by mouth every 6 (six) hours as needed.   20 tablet   0   . rosuvastatin (CRESTOR) 10 MG tablet   Oral   Take 1 tablet (10 mg total) by mouth daily.   30 tablet   5   . sucralfate (CARAFATE) 1 G tablet      take 1 tablet by mouth  four times a day   120 tablet   11   . tamsulosin (FLOMAX) 0.4 MG CAPS capsule   Oral   Take 0.4 mg by mouth daily.         . traMADol (ULTRAM) 50 MG tablet   Oral   Take 1 tablet (50 mg total) by mouth every 6 (six) hours as needed.   180 tablet   2   . VITAMIN D, CHOLECALCIFEROL, PO   Oral   Take 1,000 mg by mouth daily.            Allergies Altace; Levaquin; Lyrica; Metformin; and Iohexol  Family History  Problem Relation Age of Onset  . Dementia Mother 61  . Heart disease Father 76  . Diabetes Father   . Lymphoma Sister     lymphoma, stage 4  . Ovarian cancer Sister 8    Ovarian  . Lupus Sister     Social History Social History  Substance Use Topics  . Smoking status: Never Smoker   . Smokeless tobacco: Never Used  . Alcohol Use: 0.6 oz/week    1 Glasses of wine per week     Comment: occasional    Review of Systems Constitutional: No fever/chills Eyes: No visual changes. ENT: No sore throat. Cardiovascular:  chest pain. Respiratory:  shortness of breath. Gastrointestinal: No abdominal pain.  No nausea, no vomiting.  No diarrhea.  No constipation. Genitourinary: Negative for dysuria. Musculoskeletal: Neck pain Skin: Laceration Neurological: Headache and syncope  10-point ROS otherwise negative.  ____________________________________________   PHYSICAL EXAM:  VITAL SIGNS: ED Triage Vitals  Enc Vitals Group     BP 07/09/15 0046 145/86 mmHg     Pulse Rate 07/09/15 0046 95     Resp 07/09/15 0046 18     Temp 07/09/15 0046 98.4 F (36.9 C)     Temp Source 07/09/15 0046 Oral     SpO2 07/09/15 0042 96 %     Weight 07/09/15 0046 170 lb (77.111 kg)     Height 07/09/15 0046 5\' 7"  (1.702 m)     Head Cir --      Peak Flow --      Pain Score 07/09/15 0047 4     Pain Loc --      Pain Edu? --      Excl. in [place]? --     Constitutional: Alert and oriented. Well appearing and in moderate distress. Eyes: Conjunctivae are normal. PERRL.  EOMI. Head: 10 cm laceration to the top of the patient's head with no active bleeding. Nose: No congestion/rhinnorhea. Neck: He shouldn't and collar, cervical spine tenderness to palpation Mouth/Throat: Mucous membranes are moist.  Oropharynx non-erythematous. Cardiovascular: Normal rate, regular rhythm. Grossly normal heart sounds.  Good peripheral circulation. Pain to palpation over left chest wall Respiratory: Normal respiratory effort.  No retractions. Lungs CTAB. Gastrointestinal: Soft and nontender. No distention. Positive bowel sounds Musculoskeletal: No lower extremity tenderness nor edema.  Contusion to patient's left lateral foot Neurologic:  Normal speech and language. No gross focal neurologic deficits are appreciated.  Skin:  Skin is warm, dry and intact.  Psychiatric: Mood and affect are normal.   ____________________________________________   LABS (all labs ordered are listed, but only abnormal results are displayed)  Labs Reviewed  CBC - Abnormal; Notable for the following:    WBC 14.8 (*)    RBC 3.67 (*)    Hemoglobin 11.9 (*)    HCT 36.4 (*)    RDW 14.9 (*)  All other components within normal limits  BASIC METABOLIC PANEL - Abnormal; Notable for the following:    CO2 19 (*)    Glucose, Bld 137 (*)    BUN 32 (*)    Creatinine, Ser 1.95 (*)    Calcium 8.8 (*)    GFR calc non Af Amer 35 (*)    GFR calc Af Amer 40 (*)    All other components within normal limits  ETHANOL - Abnormal; Notable for the following:    Alcohol, Ethyl (B) 8 (*)    All other components within normal limits  TROPONIN I   ____________________________________________  EKG  ED ECG REPORT I, Loney Hering, the attending physician, personally viewed and interpreted this ECG.   Date: 07/09/2015  EKG Time: 0051  Rate: 95  Rhythm: normal sinus rhythm  Axis: normal  Intervals:none  ST&T Change: none  ____________________________________________  RADIOLOGY  CT head and  cervical spine: Negative for acute intracranial traumatic injury, there is generalized atrophy and mild chronic microvascular ischemic change, negative for acute cervical spine fracture, there is solidly ossified interbody fusion from C3-C7 with anterior fixation hardware, right mastoid effusion unchanged from 2015.  Chest x-ray: Fractures of the left second through fifth ribs ____________________________________________   PROCEDURES  Procedure(s) performed: Please, see procedure note(s).   LACERATION REPAIR Performed by: Charlesetta Ivory P Authorized by: Charlesetta Ivory P Consent: Verbal consent obtained. Risks and benefits: risks, benefits and alternatives were discussed Consent given by: patient Patient identity confirmed: provided demographic data Prepped and Draped in normal sterile fashion Wound explored  Laceration Location: frontal scalp  Laceration Length: 10 cm  No Foreign Bodies seen or palpated  Anesthesia: local infiltration  Local anesthetic: LET  Irrigation method: syringe Amount of cleaning: standard  Skin closure: staples  Number of sutures: 6   Patient tolerance: Patient tolerated the procedure well with no immediate complications.   Critical Care performed: No  ____________________________________________   INITIAL IMPRESSION / ASSESSMENT AND PLAN / ED COURSE  Pertinent labs & imaging results that were available during my care of the patient were reviewed by me and considered in my medical decision making (see chart for details).  This is a 64 year old male who fell today and has multiple rib fractures on the left side. The patient's CT of his head and his neck are unremarkable. He did receive 6 staples to his laceration. I give the patient a dose of morphine 4 mg as well as 2 Percocet. When I went back to evaluate the patient he was sitting up in the bed drinking and did not appear to be in any acute distress. I feel the patient is stable to  go home as he has not been hypoxic, has not had any effusions, contusions or pneumonia. I did inform the patient that should he develop any worsening symptoms, develop fever or increased shortness of breath he should come back into the hospital. The patient will be discharged home to follow-up with his primary care physician. ____________________________________________   FINAL CLINICAL IMPRESSION(S) / ED DIAGNOSES  Final diagnoses:  Syncope, unspecified syncope type  Head injury, initial encounter  Neck pain  Contusion  Rib fractures, left, closed, initial encounter      Loney Hering, MD 07/09/15 [phone]

## 2015-07-09 NOTE — Discharge Instructions (Signed)
Concussion, Adult A concussion, or closed-head injury, is a brain injury caused by a direct blow to the head or by a quick and sudden movement (jolt) of the head or neck. Concussions are usually not life-threatening. Even so, the effects of a concussion can be serious. If you have had a concussion before, you are more likely to experience concussion-like symptoms after a direct blow to the head.  CAUSES  Direct blow to the head, such as from running into another player during a soccer game, being hit in a fight, or hitting your head on a hard surface.  A jolt of the head or neck that causes the brain to move back and forth inside the skull, such as in a car crash. SIGNS AND SYMPTOMS The signs of a concussion can be hard to notice. Early on, they may be missed by you, family members, and health care providers. You may look fine but act or feel differently. Symptoms are usually temporary, but they may last for days, weeks, or even longer. Some symptoms may appear right away while others may not show up for hours or days. Every head injury is different. Symptoms include:  Mild to moderate headaches that will not go away.  A feeling of pressure inside your head.  Having more trouble than usual:  Learning or remembering things you have heard.  Answering questions.  Paying attention or concentrating.  Organizing daily tasks.  Making decisions and solving problems.  Slowness in thinking, acting or reacting, speaking, or reading.  Getting lost or being easily confused.  Feeling tired all the time or lacking energy (fatigued).  Feeling drowsy.  Sleep disturbances.  Sleeping more than usual.  Sleeping less than usual.  Trouble falling asleep.  Trouble sleeping (insomnia).  Loss of balance or feeling lightheaded or dizzy.  Nausea or vomiting.  Numbness or tingling.  Increased sensitivity to:  Sounds.  Lights.  Distractions.  Vision problems or eyes that tire  easily.  Diminished sense of taste or smell.  Ringing in the ears.  Mood changes such as feeling sad or anxious.  Becoming easily irritated or angry for little or no reason.  Lack of motivation.  Seeing or hearing things other people do not see or hear (hallucinations). DIAGNOSIS Your health care provider can usually diagnose a concussion based on a description of your injury and symptoms. He or she will ask whether you passed out (lost consciousness) and whether you are having trouble remembering events that happened right before and during your injury. Your evaluation might include:  A brain scan to look for signs of injury to the brain. Even if the test shows no injury, you may still have a concussion.  Blood tests to be sure other problems are not present. TREATMENT  Concussions are usually treated in an emergency department, in urgent care, or at a clinic. You may need to stay in the hospital overnight for further treatment.  Tell your health care provider if you are taking any medicines, including prescription medicines, over-the-counter medicines, and natural remedies. Some medicines, such as blood thinners (anticoagulants) and aspirin, may increase the chance of complications. Also tell your health care provider whether you have had alcohol or are taking illegal drugs. This information may affect treatment.  Your health care provider will send you home with important instructions to follow.  How fast you will recover from a concussion depends on many factors. These factors include how severe your concussion is, what part of your brain was injured,  your age, and how healthy you were before the concussion.  Most people with mild injuries recover fully. Recovery can take time. In general, recovery is slower in older persons. Also, persons who have had a concussion in the past or have other medical problems may find that it takes longer to recover from their current injury. HOME  CARE INSTRUCTIONS General Instructions  Carefully follow the directions your health care provider gave you.  Only take over-the-counter or prescription medicines for pain, discomfort, or fever as directed by your health care provider.  Take only those medicines that your health care provider has approved.  Do not drink alcohol until your health care provider says you are well enough to do so. Alcohol and certain other drugs may slow your recovery and can put you at risk of further injury.  If it is harder than usual to remember things, write them down.  If you are easily distracted, try to do one thing at a time. For example, do not try to watch TV while fixing dinner.  Talk with family members or close friends when making important decisions.  Keep all follow-up appointments. Repeated evaluation of your symptoms is recommended for your recovery.  Watch your symptoms and tell others to do the same. Complications sometimes occur after a concussion. Older adults with a brain injury may have a higher risk of serious complications, such as a blood clot on the brain.  Tell your teachers, school nurse, school counselor, coach, athletic trainer, or work Freight forwarder about your injury, symptoms, and restrictions. Tell them about what you can or cannot do. They should watch for:  Increased problems with attention or concentration.  Increased difficulty remembering or learning new information.  Increased time needed to complete tasks or assignments.  Increased irritability or decreased ability to cope with stress.  Increased symptoms.  Rest. Rest helps the brain to heal. Make sure you:  Get plenty of sleep at night. Avoid staying up late at night.  Keep the same bedtime hours on weekends and weekdays.  Rest during the day. Take daytime naps or rest breaks when you feel tired.  Limit activities that require a lot of thought or concentration. These include:  Doing homework or job-related  work.  Watching TV.  Working on the computer.  Avoid any situation where there is potential for another head injury (football, hockey, soccer, basketball, martial arts, downhill snow sports and horseback riding). Your condition will get worse every time you experience a concussion. You should avoid these activities until you are evaluated by the appropriate follow-up health care providers. Returning To Your Regular Activities You will need to return to your normal activities slowly, not all at once. You must give your body and brain enough time for recovery.  Do not return to sports or other athletic activities until your health care provider tells you it is safe to do so.  Ask your health care provider when you can drive, ride a bicycle, or operate heavy machinery. Your ability to react may be slower after a brain injury. Never do these activities if you are dizzy.  Ask your health care provider about when you can return to work or school. Preventing Another Concussion It is very important to avoid another brain injury, especially before you have recovered. In rare cases, another injury can lead to permanent brain damage, brain swelling, or death. The risk of this is greatest during the first 7-10 days after a head injury. Avoid injuries by:  Wearing a  seat belt when riding in a car.  Drinking alcohol only in moderation.  Wearing a helmet when biking, skiing, skateboarding, skating, or doing similar activities.  Avoiding activities that could lead to a second concussion, such as contact or recreational sports, until your health care provider says it is okay.  Taking safety measures in your home.  Remove clutter and tripping hazards from floors and stairways.  Use grab bars in bathrooms and handrails by stairs.  Place non-slip mats on floors and in bathtubs.  Improve lighting in dim areas. SEEK MEDICAL CARE IF:  You have increased problems paying attention or  concentrating.  You have increased difficulty remembering or learning new information.  You need more time to complete tasks or assignments than before.  You have increased irritability or decreased ability to cope with stress.  You have more symptoms than before. Seek medical care if you have any of the following symptoms for more than 2 weeks after your injury:  Lasting (chronic) headaches.  Dizziness or balance problems.  Nausea.  Vision problems.  Increased sensitivity to noise or light.  Depression or mood swings.  Anxiety or irritability.  Memory problems.  Difficulty concentrating or paying attention.  Sleep problems.  Feeling tired all the time. SEEK IMMEDIATE MEDICAL CARE IF:  You have severe or worsening headaches. These may be a sign of a blood clot in the brain.  You have weakness (even if only in one hand, leg, or part of the face).  You have numbness.  You have decreased coordination.  You vomit repeatedly.  You have increased sleepiness.  One pupil is larger than the other.  You have convulsions.  You have slurred speech.  You have increased confusion. This may be a sign of a blood clot in the brain.  You have increased restlessness, agitation, or irritability.  You are unable to recognize people or places.  You have neck pain.  It is difficult to wake you up.  You have unusual behavior changes.  You lose consciousness. MAKE SURE YOU:  Understand these instructions.  Will watch your condition.  Will get help right away if you are not doing well or get worse.   This information is not intended to replace advice given to you by your health care provider. Make sure you discuss any questions you have with your health care provider.   Document Released: 10/16/2003 Document Revised: 08/16/2014 Document Reviewed: 02/15/2013 Elsevier Interactive Patient Education 2016 [place] Injury, Adult You have a head injury.  Headaches and throwing up (vomiting) are common after a head injury. It should be easy to wake up from sleeping. Sometimes you must stay in the hospital. Most problems happen within the first 24 hours. Side effects may occur up to 7-10 days after the injury.  WHAT ARE THE TYPES OF HEAD INJURIES? Head injuries can be as minor as a bump. Some head injuries can be more severe. More severe head injuries include:  A jarring injury to the brain (concussion).  A bruise of the brain (contusion). This mean there is bleeding in the brain that can cause swelling.  A cracked skull (skull fracture).  Bleeding in the brain that collects, clots, and forms a bump (hematoma). WHEN SHOULD I GET HELP RIGHT AWAY?   You are confused or sleepy.  You cannot be woken up.  You feel sick to your stomach (nauseous) or keep throwing up (vomiting).  Your dizziness or unsteadiness is getting worse.  You have very bad,  lasting headaches that are not helped by medicine. Take medicines only as told by your doctor.  You cannot use your arms or legs like normal.  You cannot walk.  You notice changes in the black spots in the center of the colored part of your eye (pupil).  You have clear or bloody fluid coming from your nose or ears.  You have trouble seeing. During the next 24 hours after the injury, you must stay with someone who can watch you. This person should get help right away (call 911 in the [place] if you start to shake and are not able to control it (have seizures), you pass out, or you are unable to wake up. HOW CAN I PREVENT A HEAD INJURY IN THE FUTURE?  Wear seat belts.  Wear a helmet while bike riding and playing sports like football.  Stay away from dangerous activities around the house. WHEN CAN I RETURN TO NORMAL ACTIVITIES AND ATHLETICS? See your doctor before doing these activities. You should not do normal activities or play contact sports until 1 week after the following symptoms have  stopped:  Headache that does not go away.  Dizziness.  Poor attention.  Confusion.  Memory problems.  Sickness to your stomach or throwing up.  Tiredness.  Fussiness.  Bothered by bright lights or loud noises.  Anxiousness or depression.  Restless sleep. MAKE SURE YOU:   Understand these instructions.  Will watch your condition.  Will get help right away if you are not doing well or get worse.   This information is not intended to replace advice given to you by your health care provider. Make sure you discuss any questions you have with your health care provider.   Document Released: 07/08/2008 Document Revised: 08/16/2014 Document Reviewed: 04/02/2013 Elsevier Interactive Patient Education 2016 [employer].  Rib Fracture A rib fracture is a break or crack in one of the bones of the ribs. The ribs are a group of long, curved bones that wrap around your chest and attach to your spine. They protect your lungs and other organs in the chest cavity. A broken or cracked rib is often painful, but most do not cause other problems. Most rib fractures heal on their own over time. However, rib fractures can be more serious if multiple ribs are broken or if broken ribs move out of place and push against other structures. CAUSES   A direct blow to the chest. For example, this could happen during contact sports, a car accident, or a fall against a hard object.  Repetitive movements with high force, such as pitching a baseball or having severe coughing spells. SYMPTOMS   Pain when you breathe in or cough.  Pain when someone presses on the injured area. DIAGNOSIS  Your caregiver will perform a physical exam. Various imaging tests may be ordered to confirm the diagnosis and to look for related injuries. These tests may include a chest X-ray, computed tomography (CT), magnetic resonance imaging (MRI), or a bone scan. TREATMENT  Rib fractures usually heal on their own in 1-3 months.  The longer healing period is often associated with a continued cough or other aggravating activities. During the healing period, pain control is very important. Medication is usually given to control pain. Hospitalization or surgery may be needed for more severe injuries, such as those in which multiple ribs are broken or the ribs have moved out of place.  HOME CARE INSTRUCTIONS   Avoid strenuous activity and any activities or movements  that cause pain. Be careful during activities and avoid bumping the injured rib.  Gradually increase activity as directed by your caregiver.  Only take over-the-counter or prescription medications as directed by your caregiver. Do not take other medications without asking your caregiver first.  Apply ice to the injured area for the first 1-2 days after you have been treated or as directed by your caregiver. Applying ice helps to reduce inflammation and pain.  Put ice in a plastic bag.  Place a towel between your skin and the bag.   Leave the ice on for 15-20 minutes at a time, every 2 hours while you are awake.  Perform deep breathing as directed by your caregiver. This will help prevent pneumonia, which is a common complication of a broken rib. Your caregiver may instruct you to:  Take deep breaths several times a day.  Try to cough several times a day, holding a pillow against the injured area.  Use a device called an incentive spirometer to practice deep breathing several times a day.  Drink enough fluids to keep your urine clear or pale yellow. This will help you avoid constipation.   Do not wear a rib belt or binder. These restrict breathing, which can lead to pneumonia.  SEEK IMMEDIATE MEDICAL CARE IF:   You have a fever.   You have difficulty breathing or shortness of breath.   You develop a continual cough, or you cough up thick or bloody sputum.  You feel sick to your stomach (nausea), throw up (vomit), or have abdominal pain.    You have worsening pain not controlled with medications.  MAKE SURE YOU:  Understand these instructions.  Will watch your condition.  Will get help right away if you are not doing well or get worse.   This information is not intended to replace advice given to you by your health care provider. Make sure you discuss any questions you have with your health care provider.   Document Released: 07/26/2005 Document Revised: 03/28/2013 Document Reviewed: 09/27/2012 Elsevier Interactive Patient Education 2016 [employer].  Syncope Syncope is a medical term for fainting or passing out. This means you lose consciousness and drop to the ground. People are generally unconscious for less than 5 minutes. You may have some muscle twitches for up to 15 seconds before waking up and returning to normal. Syncope occurs more often in older adults, but it can happen to anyone. While most causes of syncope are not dangerous, syncope can be a sign of a serious medical problem. It is important to seek medical care.  CAUSES  Syncope is caused by a sudden drop in blood flow to the brain. The specific cause is often not determined. Factors that can bring on syncope include:  Taking medicines that lower blood pressure.  Sudden changes in posture, such as standing up quickly.  Taking more medicine than prescribed.  Standing in one place for too long.  Seizure disorders.  Dehydration and excessive exposure to heat.  Low blood sugar (hypoglycemia).  Straining to have a bowel movement.  Heart disease, irregular heartbeat, or other circulatory problems.  Fear, emotional distress, seeing blood, or severe pain. SYMPTOMS  Right before fainting, you may:  Feel dizzy or light-headed.  Feel nauseous.  See all white or all black in your field of vision.  Have cold, clammy skin. DIAGNOSIS  Your health care provider will ask about your symptoms, perform a physical exam, and perform an  electrocardiogram (ECG) to record the electrical  activity of your heart. Your health care provider may also perform other heart or blood tests to determine the cause of your syncope which may include:  Transthoracic echocardiogram (TTE). During echocardiography, sound waves are used to evaluate how blood flows through your heart.  Transesophageal echocardiogram (TEE).  Cardiac monitoring. This allows your health care provider to monitor your heart rate and rhythm in real time.  Holter monitor. This is a portable device that records your heartbeat and can help diagnose heart arrhythmias. It allows your health care provider to track your heart activity for several days, if needed.  Stress tests by exercise or by giving medicine that makes the heart beat faster. TREATMENT  In most cases, no treatment is needed. Depending on the cause of your syncope, your health care provider may recommend changing or stopping some of your medicines. HOME CARE INSTRUCTIONS  Have someone stay with you until you feel stable.  Do not drive, use machinery, or play sports until your health care provider says it is okay.  Keep all follow-up appointments as directed by your health care provider.  Lie down right away if you start feeling like you might faint. Breathe deeply and steadily. Wait until all the symptoms have passed.  Drink enough fluids to keep your urine clear or pale yellow.  If you are taking blood pressure or heart medicine, get up slowly and take several minutes to sit and then stand. This can reduce dizziness. SEEK IMMEDIATE MEDICAL CARE IF:   You have a severe headache.  You have unusual pain in the chest, abdomen, or back.  You are bleeding from your mouth or rectum, or you have black or tarry stool.  You have an irregular or very fast heartbeat.  You have pain with breathing.  You have repeated fainting or seizure-like jerking during an episode.  You faint when sitting or lying  down.  You have confusion.  You have trouble walking.  You have severe weakness.  You have vision problems. If you fainted, call your local emergency services (911 in [place] Do not drive yourself to the hospital.    This information is not intended to replace advice given to you by your health care provider. Make sure you discuss any questions you have with your health care provider.   Document Released: 07/26/2005 Document Revised: 12/10/2014 Document Reviewed: 09/24/2011 Elsevier Interactive Patient Education [employer].

## 2015-07-09 NOTE — ED Notes (Addendum)
Pt fell from a standing position, at 1700 this evening. Pt reports 2 glasses of wine , spouse reports 2 bottles of wine.  Pt c/o neck pain, by Hx, and chest wall pain. Bilateral clear lung sounds, EMS placed C-collar.  No LOC reported.  Lac to top of head noted, no bleeding at this time.

## 2015-07-09 NOTE — ED Notes (Signed)
Patient transported to CT 

## 2015-07-09 NOTE — ED Notes (Signed)
Pt returned from Xray and CT.  

## 2015-07-16 ENCOUNTER — Other Ambulatory Visit: Payer: Self-pay | Admitting: Neurology

## 2015-07-16 NOTE — Telephone Encounter (Signed)
Last OV: 06/06/15 Next OV: 06/07/16

## 2015-07-18 ENCOUNTER — Ambulatory Visit (INDEPENDENT_AMBULATORY_CARE_PROVIDER_SITE_OTHER): Payer: BC Managed Care – PPO | Admitting: Family Medicine

## 2015-07-18 ENCOUNTER — Encounter: Payer: Self-pay | Admitting: Family Medicine

## 2015-07-18 VITALS — BP 122/76 | HR 78 | Temp 98.3°F | Ht 67.0 in | Wt 192.0 lb

## 2015-07-18 DIAGNOSIS — D72829 Elevated white blood cell count, unspecified: Secondary | ICD-10-CM | POA: Diagnosis not present

## 2015-07-18 DIAGNOSIS — N179 Acute kidney failure, unspecified: Secondary | ICD-10-CM | POA: Diagnosis not present

## 2015-07-18 DIAGNOSIS — S2242XA Multiple fractures of ribs, left side, initial encounter for closed fracture: Secondary | ICD-10-CM | POA: Diagnosis not present

## 2015-07-18 DIAGNOSIS — S0990XA Unspecified injury of head, initial encounter: Secondary | ICD-10-CM | POA: Diagnosis not present

## 2015-07-18 DIAGNOSIS — S2249XA Multiple fractures of ribs, unspecified side, initial encounter for closed fracture: Secondary | ICD-10-CM | POA: Insufficient documentation

## 2015-07-18 LAB — BASIC METABOLIC PANEL
BUN: 22 mg/dL (ref 6–23)
CHLORIDE: 108 meq/L (ref 96–112)
CO2: 29 meq/L (ref 19–32)
CREATININE: 1.04 mg/dL (ref 0.40–1.50)
Calcium: 9.3 mg/dL (ref 8.4–10.5)
GFR: 76.31 mL/min (ref >60.00)
GLUCOSE: 108 mg/dL — AB (ref 70–99)
POTASSIUM: 3.7 meq/L (ref 3.5–5.1)
Sodium: 143 mEq/L (ref 135–145)

## 2015-07-18 LAB — CBC
HEMATOCRIT: 33 % — AB (ref 39.0–52.0)
HEMOGLOBIN: 10.7 g/dL — AB (ref 13.0–17.0)
MCHC: 32.6 g/dL (ref 30.0–36.0)
MCV: 100 fl (ref 78.0–100.0)
PLATELETS: 191 10*3/uL (ref 150.0–400.0)
RBC: 3.3 Mil/uL — ABNORMAL LOW (ref 4.22–5.81)
RDW: 14.6 % (ref 11.5–15.5)
WBC: 6.7 10*3/uL (ref 4.0–10.5)

## 2015-07-18 NOTE — Assessment & Plan Note (Signed)
Noted on the CBC in the ED. Has not had any fevers. Suspect this was reactive to his injury. We will repeat CBC today.

## 2015-07-18 NOTE — Assessment & Plan Note (Signed)
Patient with fractures of 4 ribs noted after his fall. Chest x-ray revealed this. He has been doing deep breathing exercises to help keep his lungs adequately inflated. Lungs sound clear today. He has no evidence of flail chest on exam. Discussed pain management with patient. He takes tramadol for pain. I advised him not to increase the dose of this given his history of seizures and he should stay on his current dosing at this time. He will continue to monitor. He will continue deep breathing exercises. He is given return precautions.

## 2015-07-18 NOTE — Progress Notes (Signed)
Pre visit review using our clinic review tool, if applicable. No additional management support is needed unless otherwise documented below in the visit note. 

## 2015-07-18 NOTE — Assessment & Plan Note (Addendum)
Creatinine increased while patient was in the ED. We'll recheck this today.

## 2015-07-18 NOTE — Patient Instructions (Signed)
Nice to see you. We will recheck some blood work today. Please continue the tramadol as prescribed for your discomfort. You need to continue to monitor for recurrent issues with falls or passing out and if this occurs you need to get seen right away. If you develop chest pain, shortness of breath, palpitations, syncope, numbness, weakness, vision changes, or any new or changing symptoms please seek medical attention.

## 2015-07-18 NOTE — Progress Notes (Signed)
Patient ID: Justin Patel, male   DOB: January 09, 1951, 64 y.o.   MRN: [id number]  Justin Rumps, MD Phone: [phone]  Justin Patel is a 64 y.o. male who presents today for ED follow-up.  Patient follows up from an ED visit. He notes he was getting something out of the refrigerator and turned to get a glass when he fell. He notes he hit his head on the corner of a wall and had a laceration to his left anterior scalp. He notes he also hit the left aspect of his chest. He is unsure if he passed out, though he does not think he went unconscious. He had not changed head position specifically with this. He had no chest pain or shortness of breath prior to the event. No palpitations. He had no seizure activity. He had no postictal phase. He had no incontinence. He notes he is able to get up on his own right after this occurred. He was seen in the ED and had no acute changes on CT of head and cervical spine. He had a chest x-ray that revealed rib fractures of the left second through fifth ribs. He notes he had had some neck pain with this as well in the posterior neck. He notes the laceration has done well with the staples. He had 6 staples placed. He notes the rib injury is still moderately painful. This specifically hurts him if he coughs or sneezes. He has no shortness of breath or exertional chest pain. He has no numbness or weakness. No vision changes. He reports overall he is improving. The area of his neck that is still mildly painful is the area around C7. Of note he also had a mildly elevated white blood cell count and an elevated creatinine in the ED. He reports he had one glass of wine prior to this fall.  PMH: nonsmoker.   ROS see history of present illness  Objective  Physical Exam Filed Vitals:   07/18/15 1055  BP: 122/76  Pulse: 78  Temp: 98.3 F (36.8 C)    Physical Exam  Constitutional: He is well-developed, well-nourished, and in no distress.  HENT:  Head: Normocephalic.    Mouth/Throat: Oropharynx is clear and moist.  10 cm well-healed laceration with 6 staples noted, no tenderness or drainage  Eyes: Conjunctivae are normal. Pupils are equal, round, and reactive to light.  Neck: Neck supple.  Cardiovascular: Normal rate, regular rhythm and normal heart sounds.   Pulmonary/Chest: Effort normal and breath sounds normal.  Musculoskeletal:  Patient does have mild tenderness over area of C7, there is no step off in this area, there is no swelling in this area, there is mild muscular tenderness around this as well, he has no other neck tenderness or back tenderness, he has full range of motion of his neck, left anterior chest in the area of the second through fifth ribs is mildly tender to palpation, there is no evidence of flail chest  Lymphadenopathy:    He has no cervical adenopathy.  Neurological: He is alert.  CN 2-12 intact, 5/5 strength in bilateral biceps, triceps, grip, quads, hamstrings, plantar and dorsiflexion, sensation to light touch intact in bilateral UE and LE, normal gait, 2+ patellar reflexes  Skin: Skin is warm and dry. He is not diaphoretic.     Assessment/Plan: Please see individual problem list.  Acute renal failure (ARF) (HCC) Creatinine increased while patient was in the ED. We'll recheck this today.  Head injury Patient is status post  fall with head injury. Laceration with 6 staples in it. Today this is well healing. Staples were removed. 6 were counted. He had a CT scan of his head while in the ED that showed no acute changes. He had a CT scan of his neck that showed no acute changes. He has no head pain at this time. He does still have some neck pain and does have tenderness over C7. CT scan in the ED did not show any changes in this area. I discussed reimaging this versus having him see his neurosurgeon who placed the plate in his neck, though patient declines this and opts to continue to monitor. He is neurologically intact today. Part  of the question today is whether or not the patient had a syncopal event. He is unsure if he passed out. It does not sound as though he had any preceding symptoms or seizure-like activity. I discussed having him follow-up with cardiology for possible to telemetry monitoring, though he declined this stating he would like to continue to monitor. Patient will continue to monitor for recurrence of symptoms or worsening symptoms. He is given return precautions.  Multiple rib fractures Patient with fractures of 4 ribs noted after his fall. Chest x-ray revealed this. He has been doing deep breathing exercises to help keep his lungs adequately inflated. Lungs sound clear today. He has no evidence of flail chest on exam. Discussed pain management with patient. He takes tramadol for pain. I advised him not to increase the dose of this given his history of seizures and he should stay on his current dosing at this time. He will continue to monitor. He will continue deep breathing exercises. He is given return precautions.  Leukocytosis Noted on the CBC in the ED. Has not had any fevers. Suspect this was reactive to his injury. We will repeat CBC today.    Orders Placed This Encounter  Procedures  . CBC  . Basic Metabolic Panel (BMET)    Dragon voice recognition software was used during the dictation process of this note. If any phrases or words seem inappropriate it is likely secondary to the translation process being inefficient.  Justin Patel

## 2015-07-18 NOTE — Assessment & Plan Note (Signed)
Patient is status post fall with head injury. Laceration with 6 staples in it. Today this is well healing. Staples were removed. 6 were counted. He had a CT scan of his head while in the ED that showed no acute changes. He had a CT scan of his neck that showed no acute changes. He has no head pain at this time. He does still have some neck pain and does have tenderness over C7. CT scan in the ED did not show any changes in this area. I discussed reimaging this versus having him see his neurosurgeon who placed the plate in his neck, though patient declines this and opts to continue to monitor. He is neurologically intact today. Part of the question today is whether or not the patient had a syncopal event. He is unsure if he passed out. It does not sound as though he had any preceding symptoms or seizure-like activity. I discussed having him follow-up with cardiology for possible to telemetry monitoring, though he declined this stating he would like to continue to monitor. Patient will continue to monitor for recurrence of symptoms or worsening symptoms. He is given return precautions.

## 2015-07-22 ENCOUNTER — Ambulatory Visit: Payer: BC Managed Care – PPO | Attending: Pain Medicine | Admitting: Pain Medicine

## 2015-07-22 ENCOUNTER — Encounter: Payer: Self-pay | Admitting: Pain Medicine

## 2015-07-22 VITALS — BP 121/60 | HR 109 | Temp 98.9°F | Resp 18 | Ht 68.0 in | Wt 168.0 lb

## 2015-07-22 DIAGNOSIS — E1140 Type 2 diabetes mellitus with diabetic neuropathy, unspecified: Secondary | ICD-10-CM | POA: Diagnosis not present

## 2015-07-22 DIAGNOSIS — M2578 Osteophyte, vertebrae: Secondary | ICD-10-CM | POA: Diagnosis not present

## 2015-07-22 DIAGNOSIS — Z981 Arthrodesis status: Secondary | ICD-10-CM | POA: Diagnosis not present

## 2015-07-22 DIAGNOSIS — X58XXXA Exposure to other specified factors, initial encounter: Secondary | ICD-10-CM | POA: Insufficient documentation

## 2015-07-22 DIAGNOSIS — M62838 Other muscle spasm: Secondary | ICD-10-CM | POA: Diagnosis not present

## 2015-07-22 DIAGNOSIS — M79604 Pain in right leg: Secondary | ICD-10-CM | POA: Diagnosis present

## 2015-07-22 DIAGNOSIS — M533 Sacrococcygeal disorders, not elsewhere classified: Secondary | ICD-10-CM

## 2015-07-22 DIAGNOSIS — M5136 Other intervertebral disc degeneration, lumbar region: Secondary | ICD-10-CM

## 2015-07-22 DIAGNOSIS — M5416 Radiculopathy, lumbar region: Secondary | ICD-10-CM

## 2015-07-22 DIAGNOSIS — S2249XA Multiple fractures of ribs, unspecified side, initial encounter for closed fracture: Secondary | ICD-10-CM | POA: Insufficient documentation

## 2015-07-22 DIAGNOSIS — M47816 Spondylosis without myelopathy or radiculopathy, lumbar region: Secondary | ICD-10-CM

## 2015-07-22 DIAGNOSIS — Q761 Klippel-Feil syndrome: Secondary | ICD-10-CM

## 2015-07-22 DIAGNOSIS — G588 Other specified mononeuropathies: Secondary | ICD-10-CM | POA: Insufficient documentation

## 2015-07-22 DIAGNOSIS — M5116 Intervertebral disc disorders with radiculopathy, lumbar region: Secondary | ICD-10-CM | POA: Diagnosis not present

## 2015-07-22 DIAGNOSIS — S2242XA Multiple fractures of ribs, left side, initial encounter for closed fracture: Secondary | ICD-10-CM | POA: Insufficient documentation

## 2015-07-22 DIAGNOSIS — M79605 Pain in left leg: Secondary | ICD-10-CM | POA: Diagnosis present

## 2015-07-22 DIAGNOSIS — M545 Low back pain: Secondary | ICD-10-CM | POA: Diagnosis present

## 2015-07-22 MED ORDER — OXYCODONE-ACETAMINOPHEN 5-325 MG PO TABS: ORAL_TABLET | ORAL | Status: DC

## 2015-07-22 NOTE — Progress Notes (Signed)
Subjective:    Patient ID: Justin Patel, male    DOB: Dec 19, 1950, 64 y.o.   MRN: [id number]  HPI  The patient is a 64 year old gentleman who returns to pain management for further evaluation and treatment of pain involving the lumbar lower extremity region. The patient states that he recently fell sustaining fracture of ribs on the left. She also sustained laceration of the head at time of his fall. The patient received medical treatment in the emergency department area the patient was advised to follow up in pain management for further evaluation and treatment of his condition. The patient states that he has severe pain involving the thoracic region due to the fractured ribs. We will proceed with intercostal nerve blocks at time return appointment in attempt to decrease severity of symptoms, minimize progression of patient's symptoms and avoid the need for more involved treatment. He discussed potential complications from fractured ribs such as pneumonia with small airway closure occurring as well. We remain available to consider patient for modifications of treatment pending response to treatment and follow-up evaluation. The patient was understanding and agreement suggested treatment plan. Patient also stated that he rolled out of bed and hit his head on the ninth table sustaining injury to the side of the head as well patient denied any severe, and states that at present time his most severe pain involving the ribs. We will proceed with intercostal nerve blocks to be performed at time return appointment pending insurance approval. The patient agreed to suggested treatment plan.  Review of Systems     Objective:   Physical Exam   There was tennis to palpation of paraspinal misreading the cervical region cervical facet region palpation which be produced pain of moderate degree. The patient was with evidence of prior trauma to the head on the left with scar of the scalp on the left as well as  with evidence of bruising of the right side of the face. He appeared to be unremarkable Spurling's maneuver and patient was with slightly decreased grip strength with out significant increase of pain with Tinel and Phalen's maneuver. Palpation over the thoracic region thoracic facet region was a severe tenderness to palpation on the left. There was tends to palpation along the sternal border as well palpation of the thoracic region of the left reproduce severe disabling pain in the upper and mid thoracic region. There was tenderness over the lumbar paraspinal misreading lumbar facet region with palpation over the lumbar paraspinal misreading lumbar facet region reproducing moderate discomfort. Palpation over the PSIS and PII S region as well as the gluteal and piriformis muscles regions reproduced pain of mild-to-moderate degree. Straight leg raise was tolerated to possibly 20. There was tends to palpation over the greater trochanteric region iliotibial band region of moderate degree. No sensory deficit or dermatomal speech detected. Abdomen soft nontender and no costovertebral tenderness noted. There was severe tenderness to palpation of the thoracic facet thoracic paraspinal musculature region on the left with severe increased pain with palpation over the upper and mid thoracic regions. 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 Lateral recess narrowing at L3-4 through L4-5  Lumbar facet syndrome  Lumbar radiculopathy  Sacroiliac joint dysfunction  Status post cervical fusion  Diabetes mellitus  Diabetic neuropathy  PLAN   Continue present medications oxycodone acetaminophen  Intercostal nerve blocks to be performed at time return appointment pending insurance  approval  F/U PCP Dr Ronette Deter  for evaliation of  BP, fractured ribs on left, head laceration and trauma, diabetes mellitus,  and general medical  condition  F/U surgical evaluation. May consider pending follow-up evaluations  F/U neurological evaluation. May consider pending follow-up evaluations  May consider radiofrequency rhizolysis or intraspinal procedures pending response to present treatment and F/U evaluation   Patient to call Pain Management Center should patient have concerns prior to scheduled return appointment

## 2015-07-22 NOTE — Patient Instructions (Addendum)
Continue present medications oxycodone acetaminophen  F/U PCP Dr Ronette Deter  for evaliation of  BP fractured ribs on left head laceration and trauma to his and general medical  condition  F/U surgical evaluation. May consider pending follow-up evaluations  F/U neurological evaluation. May consider pending follow-up evaluations  May consider radiofrequency rhizolysis or intraspinal procedures pending response to present treatment and F/U evaluation   Patient to call Pain Management Center should patient have concerns prior to scheduled return appointment.Intercostal Nerve Block Patient Information  Description: The twelve intercostal nerves arise from the first thru twelfth thoracic nerve roots.  The nerve begins at the spine and wraps around the body, lying in a groove underneath each rib.  Each intercostal nerve innervates a specific strip of skin and body walk of the abdomen and chest.  Therefore, injuries of the chest wall or abdominal wall result in pain that is transmitted back to the brian via the intercostal nerves.  Examples of such injuries include rib fractures and incisions for lung and gall bladder surgery.  Occasionally, pain may persist long after an injury or surgical incision secondary to inflammation and irritation of the intercostal nerve.  The longstanding pain is known as intercostal neuralgia.  An intercostal nerve block is preformed to eliminate pain either temporarily or permanently.  A small needle is placed below the rib and local anesthetic (like Novocaine) and possibly steroid is injected.  Usually 2-4 intercostal nerves are blocked at a time depending on the problem.  The patient will experience a slight "pin-prick" sensation for each injection.  Shortly thereafter, the strip of skin that is innervated by the blocked intercostal nerve will feel numb.  Persistent pain that is only temporarily relieved with local anesthetic may require a more permanent block. This  procedure is called Cryoneurolysis and entails placing a small probe beneath the rib to freeze the nerve.  Conditions that may be treated by intercostal nerve blocks:   Rib fractures  Longstanding pain from surgery of the chest or abdomen (intercostal neuralgia)  Pain from chest tubes  Pain from trauma to the chest  Preparation for the injections:  1. Do not eat any solid food or dairy products within 6 hours of your appointment. 2. You may drink clear liquids up to 2 hours before appointment.  Clear liquids include water, black coffee, juice or soda.  No milk or cream please. 3. You may take your regular medication, including pain medications, with a sip of water before your appointment.  Diabetics should hold regular insulin (if take separately) and take 1/2 normal NPH dose the morning of the procedure.   Carry some sugar containing items with you to your appointment. 4. A driver must accompany you and be prepared to drive you home after your procedure. 5. Bring all your current medications with you. 6. An IV may be inserted and sedation may be given at the discretion of the physician. 7. A blood pressure cuff, EKG and other monitors will often be applied during the procedure.  Some patients may need to have extra oxygen administered for a short period. 8. You will be asked to provide medical information, including your allergies, prior to the procedure.  We must know immediately if you are taking blood thinners (like Coumadin/Warfarin) or if you are allergic to IV iodine contrast (dye). We must know if you could possible be pregnant.  Possible side-effects:   Bleeding from needle site  Infection (rare)  Nerve injury (rare)  Numbness & tingling of  skin  Collapsed lung requiring chest tube (rare)  Local anesthetic toxicity (rare)  Light-headedness (temporary)  Pain at injection site (several days)  Decreased blood pressure (temporary)  Shortness of  breath  Jittery/shaking sensation (temporary)  Call if you experience:   Difficulty breathing or hives (go directly to the emergency room)  Redness, inflammation or drainage at the injection site  Severe pain at the site of the injection  Any new symptoms which are concerning   Please note:  Your pain may subside immediately but may return several hours after the injection.  Often, more than one injection is required to reduce the pain. Also, if several temporary blocks with local anesthetic are ineffective, a more permanent block with cryolysis may be necessary.  This will be discussed with you should this be the case.  If you have any questions, please call [phone] [facility]

## 2015-07-22 NOTE — Progress Notes (Signed)
Safety precautions to be maintained throughout the outpatient stay will include: orient to surroundings, keep bed in low position, maintain call bell within reach at all times, provide assistance with transfer out of bed and ambulation.  

## 2015-07-23 ENCOUNTER — Ambulatory Visit: Payer: BC Managed Care – PPO | Attending: Pain Medicine | Admitting: Pain Medicine

## 2015-07-23 ENCOUNTER — Encounter: Payer: Self-pay | Admitting: Pain Medicine

## 2015-07-23 VITALS — BP 121/78 | HR 72 | Temp 98.3°F | Resp 18 | Ht 68.0 in | Wt 168.0 lb

## 2015-07-23 DIAGNOSIS — S2242XA Multiple fractures of ribs, left side, initial encounter for closed fracture: Secondary | ICD-10-CM

## 2015-07-23 DIAGNOSIS — M47816 Spondylosis without myelopathy or radiculopathy, lumbar region: Secondary | ICD-10-CM

## 2015-07-23 DIAGNOSIS — M533 Sacrococcygeal disorders, not elsewhere classified: Secondary | ICD-10-CM

## 2015-07-23 DIAGNOSIS — W19XXXA Unspecified fall, initial encounter: Secondary | ICD-10-CM | POA: Diagnosis not present

## 2015-07-23 DIAGNOSIS — M5416 Radiculopathy, lumbar region: Secondary | ICD-10-CM

## 2015-07-23 DIAGNOSIS — M546 Pain in thoracic spine: Secondary | ICD-10-CM | POA: Insufficient documentation

## 2015-07-23 DIAGNOSIS — M5136 Other intervertebral disc degeneration, lumbar region: Secondary | ICD-10-CM

## 2015-07-23 DIAGNOSIS — Q761 Klippel-Feil syndrome: Secondary | ICD-10-CM

## 2015-07-23 MED ORDER — TRIAMCINOLONE ACETONIDE 40 MG/ML IJ SUSP: 40.0000 mg | Freq: Once | INTRAMUSCULAR | Status: DC

## 2015-07-23 MED ORDER — MIDAZOLAM HCL 5 MG/5ML IJ SOLN
INTRAMUSCULAR | Status: AC
  Administered 2015-07-23: 5 mg via INTRAVENOUS
  Filled 2015-07-23: qty 5

## 2015-07-23 MED ORDER — BUPIVACAINE HCL (PF) 0.25 % IJ SOLN: 30.0000 mL | Freq: Once | INTRAMUSCULAR | Status: DC

## 2015-07-23 MED ORDER — ORPHENADRINE CITRATE 30 MG/ML IJ SOLN: 60.0000 mg | Freq: Once | INTRAMUSCULAR | Status: DC

## 2015-07-23 MED ORDER — BUPIVACAINE HCL (PF) 0.25 % IJ SOLN
INTRAMUSCULAR | Status: AC
  Administered 2015-07-23: 14:00:00
  Filled 2015-07-23: qty 30

## 2015-07-23 MED ORDER — ORPHENADRINE CITRATE 30 MG/ML IJ SOLN
INTRAMUSCULAR | Status: AC
  Administered 2015-07-23: 14:00:00
  Filled 2015-07-23: qty 2

## 2015-07-23 MED ORDER — FENTANYL CITRATE (PF) 100 MCG/2ML IJ SOLN: 100.0000 ug | Freq: Once | INTRAMUSCULAR | Status: DC

## 2015-07-23 MED ORDER — LACTATED RINGERS IV SOLN: 1000.0000 mL | INTRAVENOUS | Status: DC

## 2015-07-23 MED ORDER — MIDAZOLAM HCL 5 MG/5ML IJ SOLN: 5.0000 mg | Freq: Once | INTRAMUSCULAR | Status: DC

## 2015-07-23 MED ORDER — TRIAMCINOLONE ACETONIDE 40 MG/ML IJ SUSP
INTRAMUSCULAR | Status: AC
  Administered 2015-07-23: 14:00:00
  Filled 2015-07-23: qty 1

## 2015-07-23 MED ORDER — FENTANYL CITRATE (PF) 100 MCG/2ML IJ SOLN
INTRAMUSCULAR | Status: AC
  Administered 2015-07-23: 100 ug via INTRAVENOUS
  Filled 2015-07-23: qty 2

## 2015-07-23 NOTE — Progress Notes (Signed)
Safety precautions to be maintained throughout the outpatient stay will include: orient to surroundings, keep bed in low position, maintain call bell within reach at all times, provide assistance with transfer out of bed and ambulation.  

## 2015-07-23 NOTE — Progress Notes (Signed)
Subjective:    Patient ID: Justin Patel, male    DOB: 1951/02/22, 64 y.o.   MRN: [id number]  HPI   PROCEDURE PERFORMED:  Intercostal nerve block.  HISTORY OF PRESENT ILLNESS:  The patient is a 64 y.o. male who returns to the [facility] for further evaluation and treatment of pain involving the upper midportion of the back status post fall with fracture of ribs on the left fracture of second third fourth and fifth ribs the patient is a severely debilitating pain on the left due to fractured ribs decision has been made to proceed with intercostal nerve blocks in attempt to decrease severity of patient's symptoms, minimize progression of patient's symptoms and avoid development of small airway closure (atelectasis) and development of pneumonia . The risks, benefits, and expectations of the procedure have been discussed and explained to the patient who was understanding and in agreement with suggested treatment plan. We will proceed with interventional treatment as discussed.   DESCRIPTION OF PROCEDURE: Intercostal nerve block with IV Versed, IV fentanyl conscious sedation, EKG, blood pressure, pulse, and pulse oximetry monitoring. The procedure was performed with the patient in the prone position under fluoroscopic guidance.   Intercostal nerve block, left side: With the patient in the prone position, Betadine prep of proposed entry site was performed under fluoroscopic guidance with AP view of the thoracic spine. Under fluoroscopic guidance, a 22 -gauge needle was inserted to contact bone of the sixth rib on the left side after which the needle was repositioned at the inferior border of the sixth rib on the left side under fluoroscopic guidance. Following documentation of needle placement, at the inferior border of the sixth rib on the left side and negative aspiration, a total of 3 mL of 0.25% bupivacaine with Kenalog was injected for left side for 6 rib intercostal nerve block.    INTERCOSTAL NERVE BLOCKS AT T5, T4, T3, T2, and T1 LEVELS: The procedure was performed at these levels as was performed at the previous level, T6, utilizing the same technique and under fluoroscopic guidance  Myoneural block injections of the parasternal margin on the left Following Betadine prep of proposed entry site a 22-gauge needle was inserted along the sternal margin on the left and 1 cc of 0.25% bupivacaine with Norflex was injected for myoneural block injection times to along the parasternal border on the left  The patient tolerated procedure well  A total of 10 mg Kenalog was utilized for the procedure.   PLAN:   1. Medications: We will continue presently prescribed medication oxycodone acetaminophen. 2. The patient is to follow up with primary care physician  Dr. Ronette Deter for further evaluation of blood pressure and general medical condition as discussed. 3. Surgical evaluation. Has been addressed 4. Neurological evaluation. Has been addressed May consider the patient for additional studies pending response to treatment and follow-up evaluation. 5. May consider radiofrequency procedures, implantation type procedures and other treatment pending response to treatment and follow-up evaluation.  The patient has been advised to adhere to proper body mechanics and to call the Pain Management Center prior to scheduled return appointment should there be significant change in condition or have other concerns regarding condition prior to scheduled return appointment. The patient has been advised to call pain management and to go to the emergency room immediately if he develops any shortness of breath has difficulty breathing  or has any change in his condition. The patient is understanding and in agreement with suggested treatment  plan.     Review of Systems     Objective:   Physical Exam        Assessment & Plan:

## 2015-07-23 NOTE — Patient Instructions (Addendum)
  Continue present medications oxycodone acetaminophen  F/U PCP Dr Ronette Deter  for evaliation of  BP fractured ribs on left head laceration and trauma to his and general medical  condition  F/U surgical evaluation. May consider pending follow-up evaluations  F/U neurological evaluation. May consider pending follow-up evaluations  May consider radiofrequency rhizolysis or intraspinal procedures pending response to present treatment and F/U evaluation   Patient to call Pain Management Center should patient have concerns prior to scheduled return appointment  Pain Management Discharge Instructions  General Discharge Instructions :  If you need to reach your doctor call: Monday-Friday 8:00 am - 4:00 pm at [phone] or toll free [phone].  After clinic hours [phone] to have operator reach doctor.  Bring all of your medication bottles to all your appointments in the pain clinic.  To cancel or reschedule your appointment with Pain Management please remember to call 24 hours in advance to avoid a fee.  Refer to the educational materials which you have been given on: General Risks, I had my Procedure. Discharge Instructions, Post Sedation.  Post Procedure Instructions:  The drugs you were given will stay in your system until tomorrow, so for the next 24 hours you should not drive, make any legal decisions or drink any alcoholic beverages.  You may eat anything you prefer, but it is better to start with liquids then soups and crackers, and gradually work up to solid foods.  Please notify your doctor immediately if you have any unusual bleeding, trouble breathing or pain that is not related to your normal pain.  Depending on the type of procedure that was done, some parts of your body may feel week and/or numb.  This usually clears up by tonight or the next day.  Walk with the use of an assistive device or accompanied by an adult for the 24 hours.  You may use ice on the  affected area for the first 24 hours.  Put ice in a Ziploc bag and cover with a towel and place against area 15 minutes on 15 minutes off.  You may switch to heat after 24 hours.

## 2015-07-24 ENCOUNTER — Telehealth: Payer: Self-pay | Admitting: *Deleted

## 2015-07-24 NOTE — Telephone Encounter (Signed)
Spoke with patient re; procedure on yesterday.  Verbalizes that he is doing great and has no  Problems or concerns.

## 2015-07-25 ENCOUNTER — Ambulatory Visit (INDEPENDENT_AMBULATORY_CARE_PROVIDER_SITE_OTHER): Payer: BC Managed Care – PPO | Admitting: Internal Medicine

## 2015-07-25 ENCOUNTER — Encounter: Payer: Self-pay | Admitting: Internal Medicine

## 2015-07-25 VITALS — BP 112/70 | HR 83 | Temp 97.9°F | Ht 68.0 in | Wt 189.1 lb

## 2015-07-25 DIAGNOSIS — R296 Repeated falls: Secondary | ICD-10-CM | POA: Insufficient documentation

## 2015-07-25 DIAGNOSIS — F1010 Alcohol abuse, uncomplicated: Secondary | ICD-10-CM | POA: Diagnosis not present

## 2015-07-25 DIAGNOSIS — R600 Localized edema: Secondary | ICD-10-CM | POA: Diagnosis not present

## 2015-07-25 DIAGNOSIS — S2242XS Multiple fractures of ribs, left side, sequela: Secondary | ICD-10-CM | POA: Diagnosis not present

## 2015-07-25 MED ORDER — FUROSEMIDE 20 MG PO TABS: 20.0000 mg | ORAL_TABLET | Freq: Every day | ORAL | Status: DC

## 2015-07-25 MED ORDER — CYCLOBENZAPRINE HCL 5 MG PO TABS: 5.0000 mg | ORAL_TABLET | Freq: Three times a day (TID) | ORAL | Status: DC | PRN

## 2015-07-25 NOTE — Progress Notes (Signed)
Subjective:    Patient ID: Justin Patel, male    DOB: [birth year], 64 y.o.   MRN: [id number]  HPI  64YO male presents for follow up.  Recently fell off bed and hit head on nightstand. He had bruising over right forehead. This seems to be improving with only mild tenderness at site.  Also had an earlier fall in November and broke ribs 4 ribs. This occurred when going from a standing position to turn while standing at his reifridgerator. He was seen in the ED and had CXR, which showed rib fractures. CT head showed no acute changes. CT cervical spine showed no acute changes.  Seen by Dr. Primus Bravo and had injections to help with rib pain. Started on Oxycodone for pain. Her reports that Dr. Primus Bravo will be managing this.  He continues to drink alcohol. He has been trying to limit use, and feels that he does not have a "physical addiction" to alcohol. He feels that his addiction is more emotional. We discussed his multiple falls over the last couple of years, and their likely relationship to alcohol abuse. He has also had GI bleeding with gastric ulcer which may have been related to alcohol use. He is interested in pursuing one on one counseling at this time.   Wt Readings from Last 3 Encounters:  07/25/15 189 lb 2 oz (85.787 kg)  07/23/15 168 lb (76.204 kg)  07/22/15 168 lb (76.204 kg)   BP Readings from Last 3 Encounters:  07/25/15 112/70  07/23/15 121/78  07/22/15 121/60    Past Medical History  Diagnosis Date  . Hypertension   . Anxiety   . Sleep apnea     hx not now since wt loss  . Diabetes mellitus   . Stones in the urinary tract   . GERD (gastroesophageal reflux disease)   . H/O hiatal hernia   . Headache(784.0)   . Arthritis   . 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 Gastric ulcer   . Degenerative disc disease   . Sacral fracture, closed ([place])   . Syncope and collapse   . Chronic kidney disease   . Heart murmur   . Hyperlipidemia   . Hx of congestive  heart failure   . Iron deficiency anemia 02/28/2015   Family History  Problem Relation Age of Onset  . Dementia Mother 72  . Heart disease Father 11  . Diabetes Father   . Lymphoma Sister     lymphoma, stage 4  . Ovarian cancer Sister 60    Ovarian  . Lupus Sister    Past Surgical History  Procedure Laterality Date  . Hernia repair  2000    hiatal  . Gastric bypass  2010    [place]  . Knee arthroscopy      bilateral, left x 2  . Osteotomy  2001    left  . Tonsillectomy    . Appendectomy    . Nasal sinus surgery      x5  . Uvulopalatoplasty  2011  . Anterior cervical decomp/discectomy fusion  01/26/2012    Procedure: ANTERIOR CERVICAL DECOMPRESSION/DISCECTOMY FUSION 3 LEVELS;  Surgeon: Floyce Stakes, MD;  Location: [facility];  Service: Neurosurgery;  Laterality: N/A;  Cervical three-four Cervical four-five Cervical five-six Cervical six-seven , Anterior cervical decompression/diskectomy, fusion, plate  . Total knee arthroplasty Left 05/2014    Dr. Marry Guan  . Cardiac catheterization      [facility], normal  .  Cardiac catheterization      [place]  . Joint replacement     Social History   Social History  . Marital Status: Married    Spouse Name: N/A  . Number of Children: N/A  . Years of Education: N/A   Social History Main Topics  . Smoking status: Never Smoker   . Smokeless tobacco: Never Used  . Alcohol Use: 0.6 oz/week    1 Glasses of wine per week     Comment: occasional  . Drug Use: No  . Sexual Activity:    Partners: Female   Other Topics Concern  . None   Social History Narrative   Lives in [place] with wife, [place]. 2 sons, William Hamburger, Lennette Bihari 76.. 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  Eyes: Negative for visual disturbance.  Respiratory: Negative for cough and shortness of breath.   Cardiovascular: Negative for chest pain, palpitations and leg swelling.  Gastrointestinal: Negative for nausea, vomiting, abdominal pain, diarrhea, constipation and abdominal distention.  Genitourinary: Negative for dysuria, urgency and difficulty urinating.  Musculoskeletal: Positive for myalgias and arthralgias. Negative for gait problem.  Skin: Positive for color change. Negative for rash.  Neurological: Positive for headaches. Negative for dizziness, weakness, light-headedness and numbness.  Hematological: Negative for adenopathy.  Psychiatric/Behavioral: Negative for sleep disturbance and dysphoric mood. The patient is not nervous/anxious.        Objective:    BP 112/70 mmHg  Pulse 83  Temp(Src) 97.9 F (36.6 C) (Oral)  Ht 5\' 8"  (1.727 m)  Wt 189 lb 2 oz (85.787 kg)  BMI 28.76 kg/m2  SpO2 97% Physical Exam  Constitutional: He is oriented to person, place, and time. He appears well-developed and well-nourished. No distress.  HENT:  Head: Normocephalic and atraumatic.  Right Ear: External ear normal.  Left Ear: External ear normal.  Nose: Nose normal.  Mouth/Throat: Oropharynx is clear and moist. No oropharyngeal exudate.  Eyes: Conjunctivae and EOM are normal. Pupils are equal, round, and reactive to light. Right eye exhibits no discharge. Left eye exhibits no discharge. No scleral icterus.  Neck: Normal range of motion. Neck supple. No tracheal deviation present. No thyromegaly present.  Cardiovascular: Normal rate, regular rhythm and normal heart sounds.  Exam reveals no gallop and no friction rub.   No murmur heard. Pulmonary/Chest: Effort normal and breath sounds normal. No accessory muscle usage. No tachypnea. No respiratory distress. He has no decreased  breath sounds. He has no wheezes. He has no rhonchi. He has no rales. He exhibits no tenderness.  Musculoskeletal: Normal range of motion. He exhibits no edema.       Arms: Lymphadenopathy:    He has no cervical adenopathy.  Neurological: He is alert and oriented to person, place, and time. No cranial nerve deficit. Coordination normal.  Skin: Skin is warm and dry. No rash noted. He is not diaphoretic. No erythema. No pallor.     Psychiatric: He has a normal mood and affect. His behavior is normal. Judgment and thought content normal.          Assessment & Plan:   Problem List  Items Addressed This Visit      Unprioritized   Alcohol abuse    Discussed risks of ongoing alcohol abuse. He is in denial and does not feel he has a "physical addiction." He has had numerous falls with life threatening injuries including SDH. He will agree to start counseling to help address alcohol use. Referral placed.      Relevant Orders   Ambulatory referral to Psychology   Multiple rib fractures    Reviewed imaging from recent fall which showed left sided rib fractures. He is now following with Dr. Primus Bravo for pain management. Discussed risks of sedating medications. Encouraged continued use of incentive spirometer.      Recurrent falls - Primary    Recurrent falls in the setting of alcohol abuse and polypharmacy with multiple narcotics prescribed. Discussed the risks of ongoing alcohol abuse and use of alcohol with narcotic pain medications. Encouraged him to limit use of sedating medications. Will also message his pain management specialist. Referral to psychology placed.       Other Visit Diagnoses    Bilateral edema of lower extremity        Relevant Medications    furosemide (LASIX) 20 MG tablet        Return in about 3 months (around 10/23/2015) for Recheck.

## 2015-07-25 NOTE — Progress Notes (Signed)
Pre visit review using our clinic review tool, if applicable. No additional management support is needed unless otherwise documented below in the visit note. 

## 2015-07-25 NOTE — Assessment & Plan Note (Signed)
Recurrent falls in the setting of alcohol abuse and polypharmacy with multiple narcotics prescribed. Discussed the risks of ongoing alcohol abuse and use of alcohol with narcotic pain medications. Encouraged him to limit use of sedating medications. Will also message his pain management specialist. Referral to psychology placed.

## 2015-07-25 NOTE — Patient Instructions (Signed)
We will set up evaluation with psychology.  Continue to follow up with Dr. Primus Bravo.  Follow up here in 107months and as needed.

## 2015-07-25 NOTE — Assessment & Plan Note (Signed)
Discussed risks of ongoing alcohol abuse. He is in denial and does not feel he has a "physical addiction." He has had numerous falls with life threatening injuries including SDH. He will agree to start counseling to help address alcohol use. Referral placed.

## 2015-07-25 NOTE — Assessment & Plan Note (Signed)
Reviewed imaging from recent fall which showed left sided rib fractures. He is now following with Dr. Primus Bravo for pain management. Discussed risks of sedating medications. Encouraged continued use of incentive spirometer.

## 2015-08-09 ENCOUNTER — Other Ambulatory Visit: Payer: Self-pay | Admitting: Internal Medicine

## 2015-08-12 NOTE — Telephone Encounter (Signed)
Please advise, not on his med. List in our system. Thanks

## 2015-08-14 ENCOUNTER — Ambulatory Visit (INDEPENDENT_AMBULATORY_CARE_PROVIDER_SITE_OTHER): Payer: BC Managed Care – PPO | Admitting: Internal Medicine

## 2015-08-14 ENCOUNTER — Encounter: Payer: Self-pay | Admitting: Internal Medicine

## 2015-08-14 VITALS — BP 119/80 | HR 75 | Temp 98.0°F | Ht 68.0 in | Wt 186.1 lb

## 2015-08-14 DIAGNOSIS — F1010 Alcohol abuse, uncomplicated: Secondary | ICD-10-CM

## 2015-08-14 DIAGNOSIS — G894 Chronic pain syndrome: Secondary | ICD-10-CM

## 2015-08-14 DIAGNOSIS — D6489 Other specified anemias: Secondary | ICD-10-CM | POA: Diagnosis not present

## 2015-08-14 DIAGNOSIS — R296 Repeated falls: Secondary | ICD-10-CM | POA: Diagnosis not present

## 2015-08-14 NOTE — Assessment & Plan Note (Signed)
Encouraged him to follow through with counseling as discussed. Encouraged him to abstain from alcohol use given multiple life threatening injuries from falls.

## 2015-08-14 NOTE — Assessment & Plan Note (Signed)
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 He will continue to follow up with pain management for any opiate Rx for pain.

## 2015-08-14 NOTE — Patient Instructions (Signed)
Follow up in 3 months or sooner as needed. 

## 2015-08-14 NOTE — Assessment & Plan Note (Signed)
No falls since last visit. Again, encouraged him to abstain from alcohol use. Follow up 3 months and prn.

## 2015-08-14 NOTE — Progress Notes (Signed)
Subjective:    Patient ID: Justin Patel, male    DOB: 03/17/1951, 65 y.o.   MRN: [id number]  HPI  65YO male presents for follow up.  Recently seen for alcohol abuse. Referral made for counseling.  Doing well. Counseling is pending this month. No recent alcohol consumption. He continues to feel that he does not have a "physical" addiction to alcohol. However, he sees utility in getting assistance with use of alcohol.  Seen by Dr. Primus Bravo, who is prescribing Percocet for pain. Also having some steroid injections. Notes improvement in overall pain. Continues to have some aching in back, neck, shoulders, and knees.  Scheduled to have iron infusion next week. Followed by Dr. Rogue Bussing. Planning for infusions every 12 weeks. No recent black or bloody stools. Feeling fatigued and lightheaded at times. No recent falls or syncope.    Wt Readings from Last 3 Encounters:  08/14/15 186 lb 2 oz (84.426 kg)  07/25/15 189 lb 2 oz (85.787 kg)  07/23/15 168 lb (76.204 kg)   BP Readings from Last 3 Encounters:  08/14/15 119/80  07/25/15 112/70  07/23/15 121/78    Past Medical History  Diagnosis Date  . Hypertension   . Anxiety   . Sleep apnea     hx not now since wt loss  . Diabetes mellitus   . Stones in the urinary tract   . GERD (gastroesophageal reflux disease)   . H/O hiatal hernia   . Headache(784.0)   . Arthritis   . 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 Gastric ulcer   . Degenerative disc disease   . Sacral fracture, closed ([place])   . Syncope and collapse   . Chronic kidney disease   . Heart murmur   . Hyperlipidemia   . Hx of congestive heart failure   . Iron deficiency anemia 02/28/2015   Family History  Problem Relation Age of Onset  . Dementia Mother 58  . Heart disease Father 40  . Diabetes Father   . Lymphoma Sister     lymphoma, stage 4  . Ovarian cancer Sister 55    Ovarian  . Lupus Sister    Past Surgical History  Procedure Laterality  Date  . Hernia repair  2000    hiatal  . Gastric bypass  2010    [place]  . Knee arthroscopy      bilateral, left x 2  . Osteotomy  2001    left  . Tonsillectomy    . Appendectomy    . Nasal sinus surgery      x5  . Uvulopalatoplasty  2011  . Anterior cervical decomp/discectomy fusion  01/26/2012    Procedure: ANTERIOR CERVICAL DECOMPRESSION/DISCECTOMY FUSION 3 LEVELS;  Surgeon: Floyce Stakes, MD;  Location: [facility];  Service: Neurosurgery;  Laterality: N/A;  Cervical three-four Cervical four-five Cervical five-six Cervical six-seven , Anterior cervical decompression/diskectomy, fusion, plate  . Total knee arthroplasty Left 05/2014    Dr. Marry Guan  . Cardiac catheterization      [facility], normal  . Cardiac catheterization      [place]  . Joint replacement     Social History   Social History  . Marital Status: Married    Spouse Name: N/A  . Number of Children: N/A  . Years of Education: N/A   Social History Main Topics  . Smoking status: Never Smoker   . Smokeless tobacco: Never Used  . Alcohol Use:  0.6 oz/week    1 Glasses of wine per week     Comment: occasional  . Drug Use: No  . Sexual Activity:    Partners: Female   Other Topics Concern  . None   Social History Narrative   Lives in [place] with wife, [place]. 2 sons, William Hamburger, Lennette Bihari 41.. 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 Negative for fever, chills, activity change, appetite change and unexpected weight change.  Eyes: Negative for visual disturbance.  Respiratory: Negative for cough and shortness of breath.   Cardiovascular: Negative for chest pain, palpitations and leg swelling.  Gastrointestinal: Negative for  nausea, vomiting, abdominal pain, diarrhea, constipation and abdominal distention.  Genitourinary: Negative for dysuria, urgency and difficulty urinating.  Musculoskeletal: Positive for myalgias, back pain and arthralgias. Negative for gait problem.  Skin: Negative for color change and rash.  Neurological: Positive for light-headedness. Negative for dizziness, syncope, weakness and headaches.  Hematological: Negative for adenopathy.  Psychiatric/Behavioral: Negative for sleep disturbance and dysphoric mood. The patient is not nervous/anxious.        Objective:    BP 119/80 mmHg  Pulse 75  Temp(Src) 98 F (36.7 C) (Oral)  Ht 5\' 8"  (1.727 m)  Wt 186 lb 2 oz (84.426 kg)  BMI 28.31 kg/m2  SpO2 99% Physical Exam  Constitutional: He is oriented to person, place, and time. He appears well-developed and well-nourished. No distress.  HENT:  Head: Normocephalic and atraumatic.  Right Ear: External ear normal.  Left Ear: External ear normal.  Nose: Nose normal.  Mouth/Throat: Oropharynx is clear and moist. No oropharyngeal exudate.  Eyes: Conjunctivae and EOM are normal. Pupils are equal, round, and reactive to light. Right eye exhibits no discharge. Left eye exhibits no discharge. No scleral icterus.  Neck: Normal range of motion. Neck supple. No tracheal deviation present. No thyromegaly present.  Cardiovascular: Normal rate, regular rhythm and normal heart sounds.  Exam reveals no gallop and no friction rub.   No murmur heard. Pulmonary/Chest: Effort normal and breath sounds normal. No accessory muscle usage. No tachypnea. No respiratory distress. He has no decreased breath sounds. He has no wheezes. He has no rhonchi. He has no rales. He exhibits bony tenderness. He exhibits no tenderness.    Musculoskeletal: Normal range of motion. He exhibits no edema.  Lymphadenopathy:    He has no cervical adenopathy.  Neurological: He is alert and oriented to person, place, and time. No cranial  nerve deficit. Coordination normal.  Skin: Skin is warm and dry. No rash noted. He is not diaphoretic. No erythema. No pallor.  Psychiatric: He has a normal mood and affect. His behavior is normal. Judgment and thought content normal.          Assessment & Plan:   Problem List Items Addressed This Visit      Unprioritized   Alcohol abuse    Encouraged him to follow through with counseling as discussed. Encouraged him to abstain from alcohol use given multiple life threatening injuries from falls.      Anemia - Primary    Reviewed notes from Hematology. Plan for iron infusion next week. Note recent Hgb drop to 10.7.  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 He will continue to follow up with pain management for any opiate Rx for pain.      Recurrent falls    No falls since last visit. Again, encouraged him to abstain from alcohol use. Follow up 3 months and prn.          Return in about 3 months (around 11/12/2015) for Recheck.

## 2015-08-14 NOTE — Assessment & Plan Note (Signed)
Reviewed notes from Hematology. Plan for iron infusion next week. Note recent Hgb drop to 10.7.

## 2015-08-14 NOTE — Progress Notes (Signed)
Pre visit review using our clinic review tool, if applicable. No additional management support is needed unless otherwise documented below in the visit note. 

## 2015-08-15 ENCOUNTER — Other Ambulatory Visit: Payer: BC Managed Care – PPO

## 2015-08-15 ENCOUNTER — Ambulatory Visit: Payer: BC Managed Care – PPO

## 2015-08-15 ENCOUNTER — Ambulatory Visit: Payer: BC Managed Care – PPO | Admitting: Hematology and Oncology

## 2015-08-18 ENCOUNTER — Inpatient Hospital Stay: Payer: BC Managed Care – PPO

## 2015-08-18 ENCOUNTER — Inpatient Hospital Stay: Payer: BC Managed Care – PPO | Admitting: Internal Medicine

## 2015-08-19 ENCOUNTER — Encounter: Payer: Self-pay | Admitting: *Deleted

## 2015-08-19 NOTE — Progress Notes (Signed)
Due to inclement weather, patient's 08/18/15 apt for lab/md/fereheme needs to be r/s. Per Dr. Pecolia Ades to r/s for next week. msg to cancer center scheduling sent. Asked scheduling to see if patient could come the day before his md/iron infusion to get labs drawn (to ensure that results are back prior to apt).

## 2015-08-20 ENCOUNTER — Other Ambulatory Visit: Payer: BC Managed Care – PPO

## 2015-08-21 ENCOUNTER — Encounter: Payer: Self-pay | Admitting: Pain Medicine

## 2015-08-21 ENCOUNTER — Ambulatory Visit: Payer: BC Managed Care – PPO | Attending: Pain Medicine | Admitting: Pain Medicine

## 2015-08-21 VITALS — BP 112/82 | HR 94 | Temp 98.5°F | Resp 16 | Ht 68.0 in | Wt 172.0 lb

## 2015-08-21 DIAGNOSIS — M51369 Other intervertebral disc degeneration, lumbar region without mention of lumbar back pain or lower extremity pain: Secondary | ICD-10-CM

## 2015-08-21 DIAGNOSIS — M542 Cervicalgia: Secondary | ICD-10-CM | POA: Diagnosis present

## 2015-08-21 DIAGNOSIS — M5416 Radiculopathy, lumbar region: Secondary | ICD-10-CM

## 2015-08-21 DIAGNOSIS — Z981 Arthrodesis status: Secondary | ICD-10-CM | POA: Diagnosis not present

## 2015-08-21 DIAGNOSIS — X58XXXA Exposure to other specified factors, initial encounter: Secondary | ICD-10-CM | POA: Insufficient documentation

## 2015-08-21 DIAGNOSIS — M47816 Spondylosis without myelopathy or radiculopathy, lumbar region: Secondary | ICD-10-CM

## 2015-08-21 DIAGNOSIS — M533 Sacrococcygeal disorders, not elsewhere classified: Secondary | ICD-10-CM | POA: Diagnosis not present

## 2015-08-21 DIAGNOSIS — M5116 Intervertebral disc disorders with radiculopathy, lumbar region: Secondary | ICD-10-CM | POA: Diagnosis not present

## 2015-08-21 DIAGNOSIS — S2242XA Multiple fractures of ribs, left side, initial encounter for closed fracture: Secondary | ICD-10-CM | POA: Diagnosis not present

## 2015-08-21 DIAGNOSIS — M62838 Other muscle spasm: Secondary | ICD-10-CM | POA: Insufficient documentation

## 2015-08-21 DIAGNOSIS — M2578 Osteophyte, vertebrae: Secondary | ICD-10-CM | POA: Insufficient documentation

## 2015-08-21 DIAGNOSIS — G588 Other specified mononeuropathies: Secondary | ICD-10-CM | POA: Insufficient documentation

## 2015-08-21 DIAGNOSIS — M546 Pain in thoracic spine: Secondary | ICD-10-CM | POA: Diagnosis present

## 2015-08-21 DIAGNOSIS — M5136 Other intervertebral disc degeneration, lumbar region: Secondary | ICD-10-CM

## 2015-08-21 DIAGNOSIS — Q761 Klippel-Feil syndrome: Secondary | ICD-10-CM

## 2015-08-21 MED ORDER — OXYCODONE-ACETAMINOPHEN 5-325 MG PO TABS: ORAL_TABLET | ORAL | Status: DC

## 2015-08-21 NOTE — Patient Instructions (Addendum)
Continue present medications oxycodone acetaminophen  Lumbar facet, medial branch nerve, blocks to be performed at time of return appointment   F/U PCP Dr Ronette Deter  for evaliation of  BP fractured ribs on left head laceration  and general medical  condition as previously discussed  F/U surgical evaluation. May consider pending follow-up evaluations  F/U neurological evaluation. May consider pending follow-up evaluations  Psych evaluation as discussed. Follow-up with psych evaluation as scheduled with Dr.Perrin   May consider radiofrequency rhizolysis or intraspinal procedures pending response to present treatment and F/U evaluation   Patient to call [facility] should patient have concerns prior to scheduled return appointmenGt.ENERAL RISKS AND COMPLICATIONS  What are the risk, side effects and possible complications? Generally speaking, most procedures are safe.  However, with any procedure there are risks, side effects, and the possibility of complications.  The risks and complications are dependent upon the sites that are lesioned, or the type of nerve block to be performed.  The closer the procedure is to the spine, the more serious the risks are.  Great care is taken when placing the radio frequency needles, block needles or lesioning probes, but sometimes complications can occur. 1. Infection: Any time there is an injection through the skin, there is a risk of infection.  This is why sterile conditions are used for these blocks.  There are four possible types of infection. 1. Localized skin infection. 2. Central Nervous System Infection-This can be in the form of Meningitis, which can be deadly. 3. Epidural Infections-This can be in the form of an epidural abscess, which can cause pressure inside of the spine, causing compression of the spinal cord with subsequent paralysis. This would require an emergency surgery to decompress, and there are no guarantees that the  patient would recover from the paralysis. 4. Discitis-This is an infection of the intervertebral discs.  It occurs in about 1% of discography procedures.  It is difficult to treat and it may lead to surgery.        2. Pain: the needles have to go through skin and soft tissues, will cause soreness.       3. Damage to internal structures:  The nerves to be lesioned may be near blood vessels or    other nerves which can be potentially damaged.       4. Bleeding: Bleeding is more common if the patient is taking blood thinners such as  aspirin, Coumadin, Ticiid, Plavix, etc., or if he/she have some genetic predisposition  such as hemophilia. Bleeding into the spinal canal can cause compression of the spinal  cord with subsequent paralysis.  This would require an emergency surgery to  decompress and there are no guarantees that the patient would recover from the  paralysis.       5. Pneumothorax:  Puncturing of a lung is a possibility, every time a needle is introduced in  the area of the chest or upper back.  Pneumothorax refers to free air around the  collapsed lung(s), inside of the thoracic cavity (chest cavity).  Another two possible  complications related to a similar event would include: Hemothorax and Chylothorax.   These are variations of the Pneumothorax, where instead of air around the collapsed  lung(s), you may have blood or chyle, respectively.       6. Spinal headaches: They may occur with any procedures in the area of the spine.       7. Persistent CSF (Cerebro-Spinal Fluid) leakage: This is  a rare problem, but may occur  with prolonged intrathecal or epidural catheters either due to the formation of a fistulous  track or a dural tear.       8. Nerve damage: By working so close to the spinal cord, there is always a possibility of  nerve damage, which could be as serious as a permanent spinal cord injury with  paralysis.       9. Death:  Although rare, severe deadly allergic reactions known as  "Anaphylactic  reaction" can occur to any of the medications used.      10. Worsening of the symptoms:  We can always make thing worse.  What are the chances of something like this happening? Chances of any of this occuring are extremely low.  By statistics, you have more of a chance of getting killed in a motor vehicle accident: while driving to the hospital than any of the above occurring .  Nevertheless, you should be aware that they are possibilities.  In general, it is similar to taking a shower.  Everybody knows that you can slip, hit your head and get killed.  Does that mean that you should not shower again?  Nevertheless always keep in mind that statistics do not mean anything if you happen to be on the wrong side of them.  Even if a procedure has a 1 (one) in a 1,000,000 (million) chance of going wrong, it you happen to be that one..Also, keep in mind that by statistics, you have more of a chance of having something go wrong when taking medications.  Who should not have this procedure? If you are on a blood thinning medication (e.g. Coumadin, Plavix, see list of "Blood Thinners"), or if you have an active infection going on, you should not have the procedure.  If you are taking any blood thinners, please inform your physician.  How should I prepare for this procedure?  Do not eat or drink anything at least six hours prior to the procedure.  Bring a driver with you .  It cannot be a taxi.  Come accompanied by an adult that can drive you back, and that is strong enough to help you if your legs get weak or numb from the local anesthetic.  Take all of your medicines the morning of the procedure with just enough water to swallow them.  If you have diabetes, make sure that you are scheduled to have your procedure done first thing in the morning, whenever possible.  If you have diabetes, take only half of your insulin dose and notify our nurse that you have done so as soon as you arrive at the  clinic.  If you are diabetic, but only take blood sugar pills (oral hypoglycemic), then do not take them on the morning of your procedure.  You may take them after you have had the procedure.  Do not take aspirin or any aspirin-containing medications, at least eleven (11) days prior to the procedure.  They may prolong bleeding.  Wear loose fitting clothing that may be easy to take off and that you would not mind if it got stained with Betadine or blood.  Do not wear any jewelry or perfume  Remove any nail coloring.  It will interfere with some of our monitoring equipment.  NOTE: Remember that this is not meant to be interpreted as a complete list of all possible complications.  Unforeseen problems may occur.  BLOOD THINNERS The following drugs contain aspirin or other products,  which can cause increased bleeding during surgery and should not be taken for 2 weeks prior to and 1 week after surgery.  If you should need take something for relief of minor pain, you may take acetaminophen which is found in Tylenol,m Datril, Anacin-3 and Panadol. It is not blood thinner. The products listed below are.  Do not take any of the products listed below in addition to any listed on your instruction sheet.  A.P.C or A.P.C with Codeine Codeine Phosphate Capsules #3 Ibuprofen Ridaura  ABC compound Congesprin Imuran rimadil  Advil Cope Indocin Robaxisal  Alka-Seltzer Effervescent Pain Reliever and Antacid Coricidin or Coricidin-D  Indomethacin Rufen  Alka-Seltzer plus Cold Medicine Cosprin Ketoprofen S-A-C Tablets  Anacin Analgesic Tablets or Capsules Coumadin Korlgesic Salflex  Anacin Extra Strength Analgesic tablets or capsules CP-2 Tablets Lanoril Salicylate  Anaprox Cuprimine Capsules Levenox Salocol  Anexsia-D Dalteparin Magan Salsalate  Anodynos Darvon compound Magnesium Salicylate Sine-off  Ansaid Dasin Capsules Magsal Sodium Salicylate  Anturane Depen Capsules Marnal Soma  APF Arthritis pain  formula Dewitt's Pills Measurin Stanback  Argesic Dia-Gesic Meclofenamic Sulfinpyrazone  Arthritis Bayer Timed Release Aspirin Diclofenac Meclomen Sulindac  Arthritis pain formula Anacin Dicumarol Medipren Supac  Analgesic (Safety coated) Arthralgen Diffunasal Mefanamic Suprofen  Arthritis Strength Bufferin Dihydrocodeine Mepro Compound Suprol  Arthropan liquid Dopirydamole Methcarbomol with Aspirin Synalgos  ASA tablets/Enseals Disalcid Micrainin Tagament  Ascriptin Doan's Midol Talwin  Ascriptin A/D Dolene Mobidin Tanderil  Ascriptin Extra Strength Dolobid Moblgesic Ticlid  Ascriptin with Codeine Doloprin or Doloprin with Codeine Momentum Tolectin  Asperbuf Duoprin Mono-gesic Trendar  Aspergum Duradyne Motrin or Motrin IB Triminicin  Aspirin plain, buffered or enteric coated Durasal Myochrisine Trigesic  Aspirin Suppositories Easprin Nalfon Trillsate  Aspirin with Codeine Ecotrin Regular or Extra Strength Naprosyn Uracel  Atromid-S Efficin Naproxen Ursinus  Auranofin Capsules Elmiron Neocylate Vanquish  Axotal Emagrin Norgesic Verin  Azathioprine Empirin or Empirin with Codeine Normiflo Vitamin E  Azolid Emprazil Nuprin Voltaren  Bayer Aspirin plain, buffered or children's or timed BC Tablets or powders Encaprin Orgaran Warfarin Sodium  Buff-a-Comp Enoxaparin Orudis Zorpin  Buff-a-Comp with Codeine Equegesic Os-Cal-Gesic   Buffaprin Excedrin plain, buffered or Extra Strength Oxalid   Bufferin Arthritis Strength Feldene Oxphenbutazone   Bufferin plain or Extra Strength Feldene Capsules Oxycodone with Aspirin   Bufferin with Codeine Fenoprofen Fenoprofen Pabalate or Pabalate-SF   Buffets II Flogesic Panagesic   Buffinol plain or Extra Strength Florinal or Florinal with Codeine Panwarfarin   Buf-Tabs Flurbiprofen Penicillamine   Butalbital Compound Four-way cold tablets Penicillin   Butazolidin Fragmin Pepto-Bismol   Carbenicillin Geminisyn Percodan   Carna Arthritis Reliever Geopen  Persantine   Carprofen Gold's salt Persistin   Chloramphenicol Goody's Phenylbutazone   Chloromycetin Haltrain Piroxlcam   Clmetidine heparin Plaquenil   Cllnoril Hyco-pap Ponstel   Clofibrate Hydroxy chloroquine Propoxyphen         Before stopping any of these medications, be sure to consult the physician who ordered them.  Some, such as Coumadin (Warfarin) are ordered to prevent or treat serious conditions such as "deep thrombosis", "pumonary embolisms", and other heart problems.  The amount of time that you may need off of the medication may also vary with the medication and the reason for which you were taking it.  If you are taking any of these medications, please make sure you notify your pain physician before you undergo any procedures.         Facet Blocks Patient Information  Description: The facets are joints in  the spine between the vertebrae.  Like any joints in the body, facets can become irritated and painful.  Arthritis can also effect the facets.  By injecting steroids and local anesthetic in and around these joints, we can temporarily block the nerve supply to them.  Steroids act directly on irritated nerves and tissues to reduce selling and inflammation which often leads to decreased pain.  Facet blocks may be done anywhere along the spine from the neck to the low back depending upon the location of your pain.   After numbing the skin with local anesthetic (like Novocaine), a small needle is passed onto the facet joints under x-ray guidance.  You may experience a sensation of pressure while this is being done.  The entire block usually lasts about 15-25 minutes.   Conditions which may be treated by facet blocks:   Low back/buttock pain  Neck/shoulder pain  Certain types of headaches  Preparation for the injection:  1. Do not eat any solid food or dairy products within 6 hours of your appointment. 2. You may drink clear liquid up to 2 hours before appointment.   Clear liquids include water, black coffee, juice or soda.  No milk or cream please. 3. You may take your regular medication, including pain medications, with a sip of water before your appointment.  Diabetics should hold regular insulin (if taken separately) and take 1/2 normal NPH dose the morning of the procedure.  Carry some sugar containing items with you to your appointment. 4. A driver must accompany you and be prepared to drive you home after your procedure. 5. Bring all your current medications with you. 6. An IV may be inserted and sedation may be given at the discretion of the physician. 7. A blood pressure cuff, EKG and other monitors will often be applied during the procedure.  Some patients may need to have extra oxygen administered for a short period. 8. You will be asked to provide medical information, including your allergies and medications, prior to the procedure.  We must know immediately if you are taking blood thinners (like Coumadin/Warfarin) or if you are allergic to IV iodine contrast (dye).  We must know if you could possible be pregnant.  Possible side-effects:   Bleeding from needle site  Infection (rare, may require surgery)  Nerve injury (rare)  Numbness & tingling (temporary)  Difficulty urinating (rare, temporary)  Spinal headache (a headache worse with upright posture)  Light-headedness (temporary)  Pain at injection site (serveral days)  Decreased blood pressure (rare, temporary)  Weakness in arm/leg (temporary)  Pressure sensation in back/neck (temporary)   Call if you experience:   Fever/chills associated with headache or increased back/neck pain  Headache worsened by an upright position  New onset, weakness or numbness of an extremity below the injection site  Hives or difficulty breathing (go to the emergency room)  Inflammation or drainage at the injection site(s)  Severe back/neck pain greater than usual  New symptoms which are  concerning to you  Please note:  Although the local anesthetic injected can often make your back or neck feel good for several hours after the injection, the pain will likely return. It takes 3-7 days for steroids to work.  You may not notice any pain relief for at least one week.  If effective, we will often do a series of 2-3 injections spaced 3-6 weeks apart to maximally decrease your pain.  After the initial series, you may be a candidate for a more permanent  nerve block of the facets.  If you have any questions, please call #[phone]) [facility]

## 2015-08-21 NOTE — Progress Notes (Signed)
Subjective:    Patient ID: Justin Patel, male    DOB: 07/31/1951, 65 y.o.   MRN: [id number]  HPI  The patient is a 65 year old gentleman who returns to pain management for further evaluation and treatment of pain involving the neck entire back upper and lower extremity regions. Patient missed a significant improvement of pain involving the mid back status post intercostal nerve blocks for treatment of fractured ribs. The patient missed a significant lower back lower extremity pain aggravated by standing walking and becoming more intense near the end of the day. We discussed patient's condition including your encroachment pain results and consideration for further surgical evaluation and treatment. At the present time we will proceed with lumbar facet, medial branch nerve, blocks at time return appointment in attempt to decrease severity of the lower back lower extremity pain and decreased progression of patient's symptoms. Urine drug screen was repeated on today's visit and patient will follow-up with his psychiatrist Dr.Perrin to address urine drug screen results and for general psych evaluation and treatment. All were in agreement with suggested treatment plan  Review of Systems     Objective:   Physical Exam  There was tenderness of the splenius capitis and occipitalis musculature region with well-healed surgical scar of the cervical region without increased warmth and erythema in the region of the scar. There was tends to palpation of the acromioclavicular and glenohumeral joint regions. Patient appeared to be with slightly decreased grip strength and Tinel and Phalen's maneuver were without increased pain of significant degree. There was tenderness over the thoracic facet thoracic paraspinal musculature region was tenderness to palpation without definite crepitus noted in the thoracic region on the left. (Patient status post fracture of ribs on the left) noted. There was tenderness over the  lumbar paraspinal muscles lumbar facet region of moderate to moderately severe degree with extension and palpation of the lumbar facets reproducing severe discomfort. Lateral bending rotation extension and palpation of the lumbar facets reproduced severe pain on the right compared to the left with tenderness over the PSIS and PII S region a moderate degree as well straight leg raising was tolerates approximately 20 without increased pain with dorsiflexion noted. There appeared to be negative clonus negative Homans and DTRs were difficult to elicit patient had difficulty relaxing. No definite sensory deficit or dermatomal dystrophy detected well-healed surgical scar of the knees noted without increased warmth and erythema in the region of the knee. Negative clonus negative Homans. 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 Lateral recess narrowing at L3-4 through L4-5  Lumbar facet syndrome  Lumbar radiculopathy  Sacroiliac joint dysfunction  Status post cervical fusion      PLAN  Continue present medications oxycodone acetaminophen  Lumbar facet, medial branch nerve, blocks to be performed at time of return appointment   F/U PCP Dr Ronette Deter  for evaliation of  BP fractured ribs on left head laceration  and general medical  condition as previously discussed  F/U surgical evaluation. May consider pending follow-up evaluations  F/U neurological evaluation. May consider pending follow-up evaluations  Repeat UDS today and proceed with psych follow-up evaluation as discussed  Psych evaluation as discussed. Follow-up with psych evaluation as scheduled with Dr.Perrin regarding UDS results and for psych follow-up evaluation  May consider  radiofrequency rhizolysis or intraspinal procedures pending response to present treatment and F/U evaluation   Patient to call [facility] should patient have concerns prior to scheduled return appointment

## 2015-08-21 NOTE — Progress Notes (Signed)
Safety precautions to be maintained throughout the outpatient stay will include: orient to surroundings, keep bed in low position, maintain call bell within reach at all times, provide assistance with transfer out of bed and ambulation.  

## 2015-08-27 ENCOUNTER — Inpatient Hospital Stay: Payer: BC Managed Care – PPO

## 2015-08-27 DIAGNOSIS — E785 Hyperlipidemia, unspecified: Secondary | ICD-10-CM | POA: Diagnosis not present

## 2015-08-27 DIAGNOSIS — M1990 Unspecified osteoarthritis, unspecified site: Secondary | ICD-10-CM | POA: Diagnosis not present

## 2015-08-27 DIAGNOSIS — E1122 Type 2 diabetes mellitus with diabetic chronic kidney disease: Secondary | ICD-10-CM | POA: Diagnosis not present

## 2015-08-27 DIAGNOSIS — G4730 Sleep apnea, unspecified: Secondary | ICD-10-CM | POA: Insufficient documentation

## 2015-08-27 DIAGNOSIS — N189 Chronic kidney disease, unspecified: Secondary | ICD-10-CM | POA: Insufficient documentation

## 2015-08-27 DIAGNOSIS — Z79899 Other long term (current) drug therapy: Secondary | ICD-10-CM | POA: Insufficient documentation

## 2015-08-27 DIAGNOSIS — R5383 Other fatigue: Secondary | ICD-10-CM | POA: Insufficient documentation

## 2015-08-27 DIAGNOSIS — R0981 Nasal congestion: Secondary | ICD-10-CM | POA: Diagnosis not present

## 2015-08-27 DIAGNOSIS — K9089 Other intestinal malabsorption: Secondary | ICD-10-CM | POA: Diagnosis not present

## 2015-08-27 DIAGNOSIS — K219 Gastro-esophageal reflux disease without esophagitis: Secondary | ICD-10-CM | POA: Diagnosis not present

## 2015-08-27 DIAGNOSIS — Z9884 Bariatric surgery status: Secondary | ICD-10-CM | POA: Diagnosis not present

## 2015-08-27 DIAGNOSIS — D508 Other iron deficiency anemias: Secondary | ICD-10-CM

## 2015-08-27 DIAGNOSIS — I129 Hypertensive chronic kidney disease with stage 1 through stage 4 chronic kidney disease, or unspecified chronic kidney disease: Secondary | ICD-10-CM | POA: Insufficient documentation

## 2015-08-27 LAB — CBC WITH DIFFERENTIAL/PLATELET
BASOS ABS: 0 10*3/uL (ref 0–0.1)
BASOS PCT: 1 %
EOS PCT: 2 %
Eosinophils Absolute: 0.2 10*3/uL (ref 0–0.7)
HCT: 37.7 % — ABNORMAL LOW (ref 40.0–52.0)
Hemoglobin: 12.6 g/dL — ABNORMAL LOW (ref 13.0–18.0)
LYMPHS PCT: 21 %
Lymphs Abs: 1.5 10*3/uL (ref 1.0–3.6)
MCH: 32.8 pg (ref 26.0–34.0)
MCHC: 33.6 g/dL (ref 32.0–36.0)
MCV: 97.7 fL (ref 80.0–100.0)
MONO ABS: 0.7 10*3/uL (ref 0.2–1.0)
MONOS PCT: 9 %
Neutro Abs: 4.9 10*3/uL (ref 1.4–6.5)
Neutrophils Relative %: 67 %
PLATELETS: 204 10*3/uL (ref 150–440)
RBC: 3.85 MIL/uL — ABNORMAL LOW (ref 4.40–5.90)
RDW: 13.2 % (ref 11.5–14.5)
WBC: 7.2 10*3/uL (ref 3.8–10.6)

## 2015-08-27 LAB — COMPREHENSIVE METABOLIC PANEL
ALBUMIN: 3.8 g/dL (ref 3.5–5.0)
ALK PHOS: 79 U/L (ref 38–126)
ALT: 38 U/L (ref 17–63)
ANION GAP: 7 (ref 5–15)
AST: 26 U/L (ref 15–41)
BILIRUBIN TOTAL: 0.4 mg/dL (ref 0.3–1.2)
BUN: 22 mg/dL — AB (ref 6–20)
CALCIUM: 9.5 mg/dL (ref 8.9–10.3)
CO2: 28 mmol/L (ref 22–32)
CREATININE: 1.12 mg/dL (ref 0.61–1.24)
Chloride: 104 mmol/L (ref 101–111)
GFR calc Af Amer: >60 mL/min (ref >60)
GFR calc non Af Amer: >60 mL/min (ref >60)
GLUCOSE: 166 mg/dL — AB (ref 65–99)
Potassium: 3.7 mmol/L (ref 3.5–5.1)
SODIUM: 139 mmol/L (ref 135–145)
TOTAL PROTEIN: 7 g/dL (ref 6.5–8.1)

## 2015-08-27 LAB — FERRITIN: Ferritin: 321 ng/mL (ref 24–336)

## 2015-08-27 LAB — RETICULOCYTES
RBC.: 3.85 MIL/uL — AB (ref 4.40–5.90)
Retic Count, Absolute: 69.3 10*3/uL (ref 19.0–183.0)
Retic Ct Pct: 1.8 % (ref 0.4–3.1)

## 2015-08-27 LAB — LACTATE DEHYDROGENASE: LDH: 110 U/L (ref 98–192)

## 2015-08-28 ENCOUNTER — Ambulatory Visit: Payer: BC Managed Care – PPO | Admitting: Psychology

## 2015-08-28 ENCOUNTER — Inpatient Hospital Stay: Payer: BC Managed Care – PPO

## 2015-08-28 ENCOUNTER — Inpatient Hospital Stay: Payer: BC Managed Care – PPO | Attending: Hematology and Oncology | Admitting: Internal Medicine

## 2015-08-28 VITALS — BP 123/81 | HR 91 | Resp 18 | Wt 185.6 lb

## 2015-08-28 DIAGNOSIS — D508 Other iron deficiency anemias: Secondary | ICD-10-CM

## 2015-08-28 DIAGNOSIS — E785 Hyperlipidemia, unspecified: Secondary | ICD-10-CM

## 2015-08-28 DIAGNOSIS — R0981 Nasal congestion: Secondary | ICD-10-CM

## 2015-08-28 DIAGNOSIS — M1990 Unspecified osteoarthritis, unspecified site: Secondary | ICD-10-CM

## 2015-08-28 DIAGNOSIS — N189 Chronic kidney disease, unspecified: Secondary | ICD-10-CM

## 2015-08-28 DIAGNOSIS — D509 Iron deficiency anemia, unspecified: Secondary | ICD-10-CM

## 2015-08-28 DIAGNOSIS — Z9884 Bariatric surgery status: Secondary | ICD-10-CM

## 2015-08-28 DIAGNOSIS — K9089 Other intestinal malabsorption: Secondary | ICD-10-CM | POA: Diagnosis not present

## 2015-08-28 DIAGNOSIS — K219 Gastro-esophageal reflux disease without esophagitis: Secondary | ICD-10-CM

## 2015-08-28 DIAGNOSIS — R5383 Other fatigue: Secondary | ICD-10-CM | POA: Diagnosis not present

## 2015-08-28 DIAGNOSIS — Z79899 Other long term (current) drug therapy: Secondary | ICD-10-CM

## 2015-08-28 DIAGNOSIS — E1122 Type 2 diabetes mellitus with diabetic chronic kidney disease: Secondary | ICD-10-CM

## 2015-08-28 DIAGNOSIS — I129 Hypertensive chronic kidney disease with stage 1 through stage 4 chronic kidney disease, or unspecified chronic kidney disease: Secondary | ICD-10-CM

## 2015-08-28 LAB — SOLUBLE TRANSFERRIN RECEPTOR: Transferrin Receptor: 14.5 nmol/L (ref 12.2–27.3)

## 2015-08-28 NOTE — Progress Notes (Signed)
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  Patient continues to complain of being tired in the last few weeks. He complains of recent viral infection- with body aches; congested sinuses; sore throat etc. This is improving.  He denies any blood in stools or black stools.   REVIEW OF SYSTEMS:  A complete 10 point review of system is done which is negative except mentioned above/history of present illness.   PAST MEDICAL HISTORY :  Past Medical History  Diagnosis Date  . Hypertension   . Anxiety   . Sleep apnea     hx not now since wt loss  . Diabetes mellitus   . Stones in the urinary tract   . GERD (gastroesophageal reflux disease)   . H/O hiatal hernia   . Headache(784.0)   . Arthritis   . 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 Gastric ulcer   . Degenerative disc disease   . Sacral fracture, closed ([place])   . Syncope and collapse   . Chronic kidney disease   . Heart murmur   . Hyperlipidemia   . Hx of congestive heart failure   . Iron deficiency anemia 02/28/2015    PAST SURGICAL HISTORY :   Past Surgical History  Procedure Laterality Date  . Hernia repair  2000    hiatal  . Gastric bypass  2010    [place] [facility]  . Knee arthroscopy      bilateral, left x 2  . Osteotomy  2001    left  . Tonsillectomy    . Appendectomy    . Nasal sinus surgery      x5  . Uvulopalatoplasty  2011  . Anterior cervical decomp/discectomy fusion  01/26/2012    Procedure: ANTERIOR CERVICAL DECOMPRESSION/DISCECTOMY FUSION 3 LEVELS;  Surgeon: Floyce Stakes, MD;  Location: [facility];  Service:  Neurosurgery;  Laterality: N/A;  Cervical three-four Cervical four-five Cervical five-six Cervical six-seven , Anterior cervical decompression/diskectomy, fusion, plate  . Total knee arthroplasty Left 05/2014    Dr. Marry Guan  . Cardiac catheterization      [facility], normal  . Cardiac catheterization      [facility]  . Joint replacement      FAMILY HISTORY :   Family History  Problem Relation Age of Onset  . Dementia Mother 69  . Heart disease Father 67  . Diabetes Father   . Lymphoma Sister     lymphoma, stage 4  . Ovarian cancer Sister 9    Ovarian  . Lupus Sister     SOCIAL HISTORY:   Social History  Substance Use Topics  . Smoking status: Never Smoker   . Smokeless tobacco: Never Used  . Alcohol Use: 0.6 oz/week    1 Glasses of wine per week     Comment: occasional    ALLERGIES:  is allergic to altace; levaquin; lyrica; metformin; and iohexol.  MEDICATIONS:  Current Outpatient Prescriptions  Medication Sig Dispense Refill  . acetaminophen (TYLENOL) 500 MG tablet Take 1,000 mg by mouth every 6 (six) hours as needed.    Marland Kitchen amLODipine (  NORVASC) 5 MG tablet take 1 tablet by mouth daily 90 tablet 3  . cyclobenzaprine (FLEXERIL) 5 MG tablet Take 1 tablet (5 mg total) by mouth 3 (three) times daily as needed for muscle spasms. 30 tablet 3  . DULoxetine (CYMBALTA) 60 MG capsule take 1 capsule by mouth once daily 30 capsule 3  . furosemide (LASIX) 20 MG tablet Take 1 tablet (20 mg total) by mouth daily. 30 tablet 3  . hydrALAZINE (APRESOLINE) 50 MG tablet take 1 tablet by mouth four times a day 120 tablet 5  . levETIRAcetam (KEPPRA) 500 MG tablet take 1 tablet by mouth twice a day 60 tablet 9  . losartan (COZAAR) 100 MG tablet take 1 tablet by mouth once daily 30 tablet 5  . metoprolol succinate (TOPROL-XL) 50 MG 24 hr tablet Take 1 tablet (50 mg total) by mouth daily. 30 tablet 6  . NEXIUM 40 MG capsule take 1 capsule by mouth once daily 30 capsule 10  .  oxyCODONE-acetaminophen (ROXICET) 5-325 MG tablet Limit 1/2  -  1 tab by mouth per day or twice per day if tolerated 40 tablet 0  . rosuvastatin (CRESTOR) 10 MG tablet Take 1 tablet (10 mg total) by mouth daily. 30 tablet 5  . sucralfate (CARAFATE) 1 G tablet take 1 tablet by mouth four times a day 120 tablet 11  . tamsulosin (FLOMAX) 0.4 MG CAPS capsule Take 0.4 mg by mouth daily.    Marland Kitchen VITAMIN D, CHOLECALCIFEROL, PO Take 1,000 mg by mouth daily.      No current facility-administered medications for this visit.    PHYSICAL EXAMINATION: ECOG PERFORMANCE STATUS: 1 - Symptomatic but completely ambulatory  BP 123/81 mmHg  Pulse 91  Resp 18  Wt 185 lb 10 oz (84.2 kg)  Filed Weights   08/28/15 0902  Weight: 185 lb 10 oz (84.2 kg)    GENERAL: Well-nourished well-developed; Alert, no distress and comfortable.   Alone; EYES: no pallor or icterus OROPHARYNX: no thrush or ulceration; good dentition  NECK: supple, no masses felt LYMPH:  no palpable lymphadenopathy in the cervical, axillary or inguinal regions LUNGS: clear to auscultation and  No wheeze or crackles HEART/CVS: regular rate & rhythm and no murmurs; 2 plus edema lower extremity edema ABDOMEN:abdomen soft, non-tender and normal bowel sounds Musculoskeletal:no cyanosis of digits and no clubbing  PSYCH: alert & oriented x 3 with fluent speech NEURO: no focal motor/sensory deficits SKIN:  no rashes or significant lesions  LABORATORY DATA:  I have reviewed the data as listed    Component Value Date/Time   NA 139 08/27/2015 1501   NA 140 05/17/2014 0529   K 3.7 08/27/2015 1501   K 3.8 05/17/2014 0529   CL 104 08/27/2015 1501   CL 105 05/17/2014 0529   CO2 28 08/27/2015 1501   CO2 27 05/17/2014 0529   GLUCOSE 166* 08/27/2015 1501   GLUCOSE 112* 05/17/2014 0529   BUN 22* 08/27/2015 1501   BUN 10 05/17/2014 0529   CREATININE 1.12 08/27/2015 1501   CREATININE 0.92 07/19/2014 1557   CREATININE 0.95 05/17/2014 0529    CALCIUM 9.5 08/27/2015 1501   CALCIUM 8.2* 05/17/2014 0529   PROT 7.0 08/27/2015 1501   PROT 4.5* 07/22/2013 0410   ALBUMIN 3.8 08/27/2015 1501   ALBUMIN 2.3* 07/22/2013 0410   AST 26 08/27/2015 1501   AST 16 07/22/2013 0410   ALT 38 08/27/2015 1501   ALT 12 07/22/2013 0410   ALKPHOS 79 08/27/2015 1501  ALKPHOS 44* 07/22/2013 0410   BILITOT 0.4 08/27/2015 1501   BILITOT 0.4 07/22/2013 0410   GFRNONAA >60 08/27/2015 1501   GFRNONAA >60 05/17/2014 0529   GFRNONAA >60 05/01/2014 0928   GFRAA >60 08/27/2015 1501   GFRAA >60 05/17/2014 0529   GFRAA >60 05/01/2014 0928    No results found for: SPEP, UPEP  Lab Results  Component Value Date   WBC 7.2 08/27/2015   NEUTROABS 4.9 08/27/2015   HGB 12.6* 08/27/2015   HCT 37.7* 08/27/2015   MCV 97.7 08/27/2015   PLT 204 08/27/2015      Chemistry      Component Value Date/Time   NA 139 08/27/2015 1501   NA 140 05/17/2014 0529   K 3.7 08/27/2015 1501   K 3.8 05/17/2014 0529   CL 104 08/27/2015 1501   CL 105 05/17/2014 0529   CO2 28 08/27/2015 1501   CO2 27 05/17/2014 0529   BUN 22* 08/27/2015 1501   BUN 10 05/17/2014 0529   CREATININE 1.12 08/27/2015 1501   CREATININE 0.92 07/19/2014 1557   CREATININE 0.95 05/17/2014 0529      Component Value Date/Time   CALCIUM 9.5 08/27/2015 1501   CALCIUM 8.2* 05/17/2014 0529   ALKPHOS 79 08/27/2015 1501   ALKPHOS 44* 07/22/2013 0410   AST 26 08/27/2015 1501   AST 16 07/22/2013 0410   ALT 38 08/27/2015 1501   ALT 12 07/22/2013 0410   BILITOT 0.4 08/27/2015 1501   BILITOT 0.4 07/22/2013 0410       RADIOGRAPHIC STUDIES: I have personally reviewed the radiological images as listed and agreed with the findings in the report. No results found.   ASSESSMENT & PLAN:   # IDA sec to Malabsorbtion from previous gastric bypass surgery. Today hemoglobin is 12.6/ferritin 321 today. Hold off any IV iron infusion.   # Patient will follow-up with [place] in approximately 3 months with  labs-CBC BMP ferritin/possible IV iron infusion as the time..   Orders Placed This Encounter  Procedures  . CBC with Differential    Standing Status: Future     Number of Occurrences:      Standing Expiration Date: 08/27/2016  . Basic metabolic panel    Standing Status: Future     Number of Occurrences:      Standing Expiration Date: 08/27/2016    Order Specific Question:  Has the patient fasted?    Answer:  No  . Ferritin    Standing Status: Future     Number of Occurrences:      Standing Expiration Date: 08/27/2016   I spent 15 minutes counseling the patient face to face. The total time spent in the appointment was 30 minutes and more than 50% was on counseling and review of test results     Cammie Sickle, MD 08/28/2015 9:32 AM

## 2015-09-02 ENCOUNTER — Ambulatory Visit (INDEPENDENT_AMBULATORY_CARE_PROVIDER_SITE_OTHER): Payer: BC Managed Care – PPO | Admitting: Psychology

## 2015-09-02 ENCOUNTER — Other Ambulatory Visit: Payer: Self-pay | Admitting: Pain Medicine

## 2015-09-02 DIAGNOSIS — F1010 Alcohol abuse, uncomplicated: Secondary | ICD-10-CM | POA: Diagnosis not present

## 2015-09-08 ENCOUNTER — Encounter: Payer: Self-pay | Admitting: Pain Medicine

## 2015-09-08 ENCOUNTER — Ambulatory Visit: Payer: BC Managed Care – PPO | Attending: Pain Medicine | Admitting: Pain Medicine

## 2015-09-08 ENCOUNTER — Other Ambulatory Visit: Payer: Self-pay | Admitting: Internal Medicine

## 2015-09-08 VITALS — BP 87/54 | HR 68 | Temp 98.6°F | Resp 16 | Ht 68.0 in | Wt 172.0 lb

## 2015-09-08 DIAGNOSIS — M1288 Other specific arthropathies, not elsewhere classified, other specified site: Secondary | ICD-10-CM | POA: Insufficient documentation

## 2015-09-08 DIAGNOSIS — M545 Low back pain: Secondary | ICD-10-CM | POA: Diagnosis present

## 2015-09-08 DIAGNOSIS — M79606 Pain in leg, unspecified: Secondary | ICD-10-CM | POA: Diagnosis present

## 2015-09-08 DIAGNOSIS — M51369 Other intervertebral disc degeneration, lumbar region without mention of lumbar back pain or lower extremity pain: Secondary | ICD-10-CM

## 2015-09-08 DIAGNOSIS — M47816 Spondylosis without myelopathy or radiculopathy, lumbar region: Secondary | ICD-10-CM | POA: Diagnosis not present

## 2015-09-08 DIAGNOSIS — M5416 Radiculopathy, lumbar region: Secondary | ICD-10-CM

## 2015-09-08 DIAGNOSIS — M2578 Osteophyte, vertebrae: Secondary | ICD-10-CM | POA: Diagnosis not present

## 2015-09-08 DIAGNOSIS — M533 Sacrococcygeal disorders, not elsewhere classified: Secondary | ICD-10-CM

## 2015-09-08 DIAGNOSIS — M5136 Other intervertebral disc degeneration, lumbar region: Secondary | ICD-10-CM | POA: Insufficient documentation

## 2015-09-08 DIAGNOSIS — Q761 Klippel-Feil syndrome: Secondary | ICD-10-CM

## 2015-09-08 DIAGNOSIS — S2242XA Multiple fractures of ribs, left side, initial encounter for closed fracture: Secondary | ICD-10-CM

## 2015-09-08 MED ORDER — LIDOCAINE HCL (PF) 1 % IJ SOLN: 10.0000 mL | Freq: Once | INTRAMUSCULAR | Status: DC

## 2015-09-08 MED ORDER — FENTANYL CITRATE (PF) 100 MCG/2ML IJ SOLN
100.0000 ug | Freq: Once | INTRAMUSCULAR | Status: AC
  Administered 2015-09-08: 100 ug via INTRAVENOUS

## 2015-09-08 MED ORDER — TRIAMCINOLONE ACETONIDE 40 MG/ML IJ SUSP
INTRAMUSCULAR | Status: AC
  Administered 2015-09-08: 40 mg
  Filled 2015-09-08: qty 1

## 2015-09-08 MED ORDER — CEFAZOLIN SODIUM 1 G IJ SOLR
INTRAMUSCULAR | Status: AC
  Administered 2015-09-08: 1 g via INTRAVENOUS
  Filled 2015-09-08: qty 10

## 2015-09-08 MED ORDER — BUPIVACAINE HCL (PF) 0.25 % IJ SOLN
INTRAMUSCULAR | Status: AC
  Administered 2015-09-08: 30 mL
  Filled 2015-09-08: qty 30

## 2015-09-08 MED ORDER — CEFAZOLIN SODIUM 1-5 GM-% IV SOLN
1.0000 g | Freq: Once | INTRAVENOUS | Status: DC
Start: 2015-09-08 — End: 2015-10-02

## 2015-09-08 MED ORDER — ORPHENADRINE CITRATE 30 MG/ML IJ SOLN: 60.0000 mg | Freq: Once | INTRAMUSCULAR | Status: DC

## 2015-09-08 MED ORDER — FENTANYL CITRATE (PF) 100 MCG/2ML IJ SOLN
INTRAMUSCULAR | Status: AC
  Administered 2015-09-08: 100 ug via INTRAVENOUS
  Filled 2015-09-08: qty 2

## 2015-09-08 MED ORDER — MIDAZOLAM HCL 5 MG/5ML IJ SOLN
INTRAMUSCULAR | Status: AC
  Administered 2015-09-08: 3 mg via INTRAVENOUS
  Filled 2015-09-08: qty 5

## 2015-09-08 MED ORDER — CEFUROXIME AXETIL 250 MG PO TABS: 250.0000 mg | ORAL_TABLET | Freq: Two times a day (BID) | ORAL | Status: DC

## 2015-09-08 MED ORDER — ORPHENADRINE CITRATE 30 MG/ML IJ SOLN
INTRAMUSCULAR | Status: AC
  Filled 2015-09-08: qty 2

## 2015-09-08 MED ORDER — MIDAZOLAM HCL 5 MG/5ML IJ SOLN
5.0000 mg | Freq: Once | INTRAMUSCULAR | Status: AC
  Administered 2015-09-08: 3 mg via INTRAVENOUS

## 2015-09-08 MED ORDER — TRIAMCINOLONE ACETONIDE 40 MG/ML IJ SUSP
40.0000 mg | Freq: Once | INTRAMUSCULAR | Status: AC
  Administered 2015-09-08: 40 mg

## 2015-09-08 MED ORDER — LACTATED RINGERS IV SOLN: 1000.0000 mL | INTRAVENOUS | Status: DC

## 2015-09-08 MED ORDER — LIDOCAINE HCL (PF) 1 % IJ SOLN
INTRAMUSCULAR | Status: AC
  Filled 2015-09-08: qty 10

## 2015-09-08 MED ORDER — BUPIVACAINE HCL (PF) 0.25 % IJ SOLN
30.0000 mL | Freq: Once | INTRAMUSCULAR | Status: AC
  Administered 2015-09-08: 30 mL

## 2015-09-08 NOTE — Progress Notes (Signed)
Subjective:    Patient ID: Justin Patel, male    DOB: 1950/08/24, 65 y.o.   MRN: [id number]  HPI PROCEDURE PERFORMED: Lumbar facet (medial branch block)   NOTE: The patient is a 65 y.o. male who returns to [facility] for further evaluation and treatment of pain involving the lumbar and lower extremity region. 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 Lateral recess narrowing at L3-4 through L4-5. There is concern regarding significant component of patient's pain began due to facet arthropathy with facet syndrome The risks, benefits, and expectations of the procedure have been discussed and explained to the patient who was understanding and in agreement with suggested treatment plan. We will proceed with interventional treatment as discussed and as explained to the patient who was understanding and wished to proceed with procedure as planned.   DESCRIPTION OF PROCEDURE: Lumbar facet (medial branch block) with IV Versed, IV fentanyl conscious sedation, EKG, blood pressure, pulse, and pulse oximetry monitoring. The procedure was performed with the patient in the prone position. Betadine prep of proposed entry site performed.   NEEDLE PLACEMENT AT: Left L 2 lumbar facet (medial branch block). Under fluoroscopic guidance with oblique orientation of 15 degrees, a 22-gauge needle was inserted at the L 2 vertebral body level with needle placed at the targeted area of Burton's Eye or Eye of the Scotty Dog with documentation of needle placement in the superior and lateral border of targeted area of Burton's Eye or Eye of the Scotty Dog with oblique orientation of 15 degrees. Following documentation of needle placement at the L 2 vertebral body level, needle placement was then accomplished at the L 3 vertebral body level.   NEEDLE PLACEMENT AT L3, L4, and L5 VERTEBRAL BODY LEVELS ON THE LEFT  SIDE The procedure was performed at the L3, L4, and L5 vertebral body levels exactly as was performed at the L 2 vertebral body level utilizing the same technique and under fluoroscopic guidance.  NEEDLE PLACEMENT AT THE SACRAL ALA with AP view of the lumbosacral spine. With the patient in the prone position, Betadine prep of proposed entry site accomplished, a 22 gauge needle was inserted in the region of the sacral ala (groove formed by the superior articulating process of S1 and the sacral wing). Following documentation of needle placement at the sacral ala,   Needle placement was then verified at all levels on lateral view. Following documentation of needle placement at all levels on lateral view and following negative aspiration for heme and CSF, each level was injected with 1 mL of 0.25% bupivacaine with Kenalog.     LUMBAR FACET, MEDIAL BRANCH NERVE, BLOCKS PERFORMED ON THE RIGHT SIDE   The procedure was performed on the right side exactly as was performed on the left side at the same levels and utilizing the same technique under fluoroscopic guidance.     The patient tolerated the procedure well. A total of 40 mg of Kenalog was utilized for the procedure.   PLAN:  1. Medications: The patient will continue presently prescribed medications. 2. May consider modification of treatment regimen at time of return appointment pending response to treatment rendered on today's visit. 3. The patient is to follow-up with primary care physician Dr. Ronette Deter for further evaluation of blood pressure and general medical condition status post steroid injection performed on today's visit. 4. Surgical follow-up evaluation.Marland Kitchen Has been addressed 5. Neurological follow-up  evaluation.. May consider [facility] EMG studies and other studies 6. The patient may be candidate for radiofrequency procedures, implantation type procedures, and other treatment pending response to treatment and follow-up  evaluation. 7. The patient has been advised to call the Pain Management Center prior to scheduled return appointment should there be significant change in condition or should patient have other concerns regarding condition prior to scheduled return appointment.  The patient is understanding and in agreement with suggested treatment plan.    Review of Systems     Objective:   Physical Exam        Assessment & Plan:

## 2015-09-08 NOTE — Progress Notes (Signed)
Safety precautions to be maintained throughout the outpatient stay will include: orient to surroundings, keep bed in low position, maintain call bell within reach at all times, provide assistance with transfer out of bed and ambulation.  

## 2015-09-08 NOTE — Patient Instructions (Addendum)
PLAN   Continue present medications oxycodone acetaminophen. Please obtain Ceftin antibiotic today and begin taking Ceftin antibiotic today as prescribed   F/U PCP Dr Ronette Deter  for evaliation of  BP and general medical  condition  F/U surgical evaluation. May consider pending follow-up evaluations  F/U neurological evaluation. May consider pending follow-up evaluations  May consider radiofrequency rhizolysis or intraspinal procedures pending response to present treatment and F/U evaluation   Patient to call Pain Management Center should patient have concerns prior to scheduled return appointmentFacet Blocks Patient Information  Description: The facets are joints in the spine between the vertebrae.  Like any joints in the body, facets can become irritated and painful.  Arthritis can also effect the facets.  By injecting steroids and local anesthetic in and around these joints, we can temporarily block the nerve supply to them.  Steroids act directly on irritated nerves and tissues to reduce selling and inflammation which often leads to decreased pain.  Facet blocks may be done anywhere along the spine from the neck to the low back depending upon the location of your pain.   After numbing the skin with local anesthetic (like Novocaine), a small needle is passed onto the facet joints under x-ray guidance.  You may experience a sensation of pressure while this is being done.  The entire block usually lasts about 15-25 minutes.   Conditions which may be treated by facet blocks:   Low back/buttock pain  Neck/shoulder pain  Certain types of headaches  Preparation for the injection:  1. Do not eat any solid food or dairy products within 6 hours of your appointment. 2. You may drink clear liquid up to 2 hours before appointment.  Clear liquids include water, black coffee, juice or soda.  No milk or cream please. 3. You may take your regular medication, including pain medications, with  a sip of water before your appointment.  Diabetics should hold regular insulin (if taken separately) and take 1/2 normal NPH dose the morning of the procedure.  Carry some sugar containing items with you to your appointment. 4. A driver must accompany you and be prepared to drive you home after your procedure. 5. Bring all your current medications with you. 6. An IV may be inserted and sedation may be given at the discretion of the physician. 7. A blood pressure cuff, EKG and other monitors will often be applied during the procedure.  Some patients may need to have extra oxygen administered for a short period. 8. You will be asked to provide medical information, including your allergies and medications, prior to the procedure.  We must know immediately if you are taking blood thinners (like Coumadin/Warfarin) or if you are allergic to IV iodine contrast (dye).  We must know if you could possible be pregnant.  Possible side-effects:   Bleeding from needle site  Infection (rare, may require surgery)  Nerve injury (rare)  Numbness & tingling (temporary)  Difficulty urinating (rare, temporary)  Spinal headache (a headache worse with upright posture)  Light-headedness (temporary)  Pain at injection site (serveral days)  Decreased blood pressure (rare, temporary)  Weakness in arm/leg (temporary)  Pressure sensation in back/neck (temporary)   Call if you experience:   Fever/chills associated with headache or increased back/neck pain  Headache worsened by an upright position  New onset, weakness or numbness of an extremity below the injection site  Hives or difficulty breathing (go to the emergency room)  Inflammation or drainage at the injection site(s)  Severe back/neck pain greater than usual  New symptoms which are concerning to you  Please note:  Although the local anesthetic injected can often make your back or neck feel good for several hours after the injection,  the pain will likely return. It takes 3-7 days for steroids to work.  You may not notice any pain relief for at least one week.  If effective, we will often do a series of 2-3 injections spaced 3-6 weeks apart to maximally decrease your pain.  After the initial series, you may be a candidate for a more permanent nerve block of the facets.  If you have any questions, please call #[phone]) [facility]  General Discharge Instructions :  If you need to reach your doctor call: Monday-Friday 8:00 am - 4:00 pm at [phone] or toll free [phone].  After clinic hours [phone] to have operator reach doctor.  Bring all of your medication bottles to all your appointments in the pain clinic.  To cancel or reschedule your appointment with Pain Management please remember to call 24 hours in advance to avoid a fee.  Refer to the educational materials which you have been given on: General Risks, I had my Procedure. Discharge Instructions, Post Sedation.  Post Procedure Instructions:  The drugs you were given will stay in your system until tomorrow, so for the next 24 hours you should not drive, make any legal decisions or drink any alcoholic beverages.  You may eat anything you prefer, but it is better to start with liquids then soups and crackers, and gradually work up to solid foods.  Please notify your doctor immediately if you have any unusual bleeding, trouble breathing or pain that is not related to your normal pain.  Depending on the type of procedure that was done, some parts of your body may feel week and/or numb.  This usually clears up by tonight or the next day.  Walk with the use of an assistive device or accompanied by an adult for the 24 hours.  You may use ice on the affected area for the first 24 hours.  Put ice in a Ziploc bag and cover with a towel and place against area 15 minutes on 15 minutes  off.  You may switch to heat after 24 hours.

## 2015-09-09 ENCOUNTER — Telehealth: Payer: Self-pay | Admitting: *Deleted

## 2015-09-09 NOTE — Telephone Encounter (Signed)
Message left

## 2015-09-15 ENCOUNTER — Other Ambulatory Visit: Payer: Self-pay | Admitting: Internal Medicine

## 2015-09-17 ENCOUNTER — Encounter: Payer: Self-pay | Admitting: Neurology

## 2015-09-18 ENCOUNTER — Other Ambulatory Visit: Payer: Self-pay | Admitting: Cardiovascular Disease

## 2015-09-24 ENCOUNTER — Ambulatory Visit (INDEPENDENT_AMBULATORY_CARE_PROVIDER_SITE_OTHER): Payer: BC Managed Care – PPO | Admitting: Psychology

## 2015-09-24 DIAGNOSIS — F4323 Adjustment disorder with mixed anxiety and depressed mood: Secondary | ICD-10-CM | POA: Diagnosis not present

## 2015-09-29 ENCOUNTER — Encounter: Payer: Self-pay | Admitting: Pain Medicine

## 2015-09-29 ENCOUNTER — Ambulatory Visit: Payer: BC Managed Care – PPO | Attending: Pain Medicine | Admitting: Pain Medicine

## 2015-09-29 VITALS — BP 113/75 | HR 81 | Temp 98.4°F | Resp 16 | Ht 68.0 in | Wt 172.0 lb

## 2015-09-29 DIAGNOSIS — G588 Other specified mononeuropathies: Secondary | ICD-10-CM | POA: Diagnosis not present

## 2015-09-29 DIAGNOSIS — M545 Low back pain: Secondary | ICD-10-CM | POA: Diagnosis present

## 2015-09-29 DIAGNOSIS — M47816 Spondylosis without myelopathy or radiculopathy, lumbar region: Secondary | ICD-10-CM

## 2015-09-29 DIAGNOSIS — Z981 Arthrodesis status: Secondary | ICD-10-CM | POA: Insufficient documentation

## 2015-09-29 DIAGNOSIS — M2578 Osteophyte, vertebrae: Secondary | ICD-10-CM | POA: Insufficient documentation

## 2015-09-29 DIAGNOSIS — M79606 Pain in leg, unspecified: Secondary | ICD-10-CM | POA: Diagnosis present

## 2015-09-29 DIAGNOSIS — Q761 Klippel-Feil syndrome: Secondary | ICD-10-CM

## 2015-09-29 DIAGNOSIS — M5116 Intervertebral disc disorders with radiculopathy, lumbar region: Secondary | ICD-10-CM | POA: Insufficient documentation

## 2015-09-29 DIAGNOSIS — M533 Sacrococcygeal disorders, not elsewhere classified: Secondary | ICD-10-CM

## 2015-09-29 DIAGNOSIS — M62838 Other muscle spasm: Secondary | ICD-10-CM | POA: Insufficient documentation

## 2015-09-29 DIAGNOSIS — M5416 Radiculopathy, lumbar region: Secondary | ICD-10-CM

## 2015-09-29 DIAGNOSIS — M542 Cervicalgia: Secondary | ICD-10-CM | POA: Diagnosis present

## 2015-09-29 DIAGNOSIS — X58XXXA Exposure to other specified factors, initial encounter: Secondary | ICD-10-CM | POA: Diagnosis not present

## 2015-09-29 DIAGNOSIS — M5136 Other intervertebral disc degeneration, lumbar region: Secondary | ICD-10-CM

## 2015-09-29 DIAGNOSIS — S2242XA Multiple fractures of ribs, left side, initial encounter for closed fracture: Secondary | ICD-10-CM | POA: Diagnosis not present

## 2015-09-29 DIAGNOSIS — M51369 Other intervertebral disc degeneration, lumbar region without mention of lumbar back pain or lower extremity pain: Secondary | ICD-10-CM

## 2015-09-29 MED ORDER — OXYCODONE-ACETAMINOPHEN 5-325 MG PO TABS: ORAL_TABLET | ORAL | Status: DC

## 2015-09-29 NOTE — Patient Instructions (Addendum)
PLAN   Continue present medications oxycodone acetaminophen  Lumbar facet, medial branch nerve, blocks to be performed at time of return appointment  F/U PCP Dr Ronette Deter  for evaliation of  BP and general medical  condition as previously discussed  F/U surgical evaluation. May consider pending follow-up evaluations  F/U neurological evaluation. May consider pending follow-up evaluations  Ask Angie and secretaries if you have been approved for radiofrequency rhizolysis lumbar facets medial branch nerves  Psych evaluation as discussed. Follow-up with psych evaluation as scheduled with Dr.Perrin   May consider radiofrequency rhizolysis or intraspinal procedures pending response to present treatment and F/U evaluation   Patient to call [facility] should patient have concerns prior to scheduled return appointmentRadiofrequency Lesioning, Care After Refer to this sheet in the next few weeks. These instructions provide you with information about caring for yourself after your procedure. Your health care provider may also give you more specific instructions. Your treatment has been planned according to current medical practices, but problems sometimes occur. Call your health care provider if you have any problems or questions after your procedure. WHAT TO EXPECT AFTER THE PROCEDURE After your procedure, it is common to have:  Pain from the burned nerve.  Temporary numbness. HOME CARE INSTRUCTIONS  Take over-the-counter and prescription medicines only as told by your health care provider.  Return to your normal activities as told by your health care provider. Ask your health care provider what activities are safe for you.  Pay close attention to how you feel after the procedure. If you start to have pain, write down when it hurts and how it feels. This will help you and your health care provider to know if you need an additional treatment.  Check your needle insertion  site every day for signs of infection. Watch for:  Redness, swelling, or pain.  Fluid, blood, or pus.  Keep all follow-up visits as told by your health care provider. This is important. SEEK MEDICAL CARE IF:  Your pain does not get better.  You have redness, swelling, or pain at the needle insertion site.  You have fluid, blood, or pus coming from the needle insertion site.  You have a fever. SEEK IMMEDIATE MEDICAL CARE IF:  You develop sudden, severe pain.  You develop numbness or tingling near the procedure site that does not go away.   This information is not intended to replace advice given to you by your health care provider. Make sure you discuss any questions you have with your health care provider.   Document Released: 03/24/2011 Document Revised: 04/16/2015 Document Reviewed: 09/02/2014 Elsevier Interactive Patient Education 2016 [place]. Radiofrequency Lesioning Radiofrequency lesioning is a procedure that is performed to relieve pain. The procedure is often used for back, neck, or arm pain. Radiofrequency lesioning involves the use of a machine that creates radio waves to make heat. During the procedure, the heat is applied to the nerve that carries the pain signal. The heat damages the nerve and interferes with the pain signal. Pain relief usually lasts for 6 months to 1 year. LET [facility] CARE PROVIDER KNOW ABOUT:  Any allergies you have.  All medicines you are taking, including vitamins, herbs, eye drops, creams, and over-the-counter medicines.  Previous problems you or members of your family have had with the use of anesthetics.  Any blood disorders you have.  Previous surgeries you have had.  Any medical conditions you have.  Whether you are pregnant or may be pregnant. RISKS  AND COMPLICATIONS Generally, this is a safe procedure. However, problems may occur, including:  Pain or soreness at the injection site.  Infection at the injection  site.  Damage to nerves or blood vessels. BEFORE THE PROCEDURE  Ask your health care provider about:  Changing or stopping your regular medicines. This is especially important if you are taking diabetes medicines or blood thinners.  Taking medicines such as aspirin and ibuprofen. These medicines can thin your blood. Do not take these medicines before your procedure if your health care provider instructs you not to.  Follow instructions from your health care provider about eating or drinking restrictions.  Plan to have someone take you home after the procedure.  If you go home right after the procedure, plan to have someone with you for 24 hours. PROCEDURE  You will be given one or more of the following:  A medicine to help you relax (sedative).  A medicine to numb the area (local anesthetic).  You will be awake during the procedure. You will need to be able to talk with the health care provider during the procedure.  With the help of a type of X-ray (fluoroscopy), the health care provider will insert a radiofrequency needle into the area to be treated.  Next, a wire that carries the radio waves (electrode) will be put through the radiofrequency needle. An electrical pulse will be sent through the electrode to verify the correct nerve. You will feel a tingling sensation, and you may have muscle twitching.  Then, the tissue that is around the needle tip will be heated by an electric current that is passed using the radiofrequency machine. This will numb the nerves.  A bandage (dressing) will be put on the insertion area after the procedure is done. The procedure may vary among health care providers and hospitals. AFTER THE PROCEDURE  Your blood pressure, heart rate, breathing rate, and blood oxygen level will be monitored often until the medicines you were given have worn off.  Return to your normal activities as directed by your health care provider.   This information is not  intended to replace advice given to you by your health care provider. Make sure you discuss any questions you have with your health care provider.   Document Released: 03/24/2011 Document Revised: 04/16/2015 Document Reviewed: 09/02/2014 Elsevier Interactive Patient Education 2016 [employer] Pain Management Discharge Instructions  General Discharge Instructions :  If you need to reach your doctor call: Monday-Friday 8:00 am - 4:00 pm at [phone] or toll free [phone].  After clinic hours [phone] to have operator reach doctor.  Bring all of your medication bottles to all your appointments in the pain clinic.  To cancel or reschedule your appointment with Pain Management please remember to call 24 hours in advance to avoid a fee.  Refer to the educational materials which you have been given on: General Risks, I had my Procedure. Discharge Instructions, Post Sedation.  Post Procedure Instructions:  The drugs you were given will stay in your system until tomorrow, so for the next 24 hours you should not drive, make any legal decisions or drink any alcoholic beverages.  You may eat anything you prefer, but it is better to start with liquids then soups and crackers, and gradually work up to solid foods.  Please notify your doctor immediately if you have any unusual bleeding, trouble breathing or pain that is not related to your normal pain.  Depending on the type of procedure that was done,  some parts of your body may feel week and/or numb.  This usually clears up by tonight or the next day.  Walk with the use of an assistive device or accompanied by an adult for the 24 hours.  You may use ice on the affected area for the first 24 hours.  Put ice in a Ziploc bag and cover with a towel and place against area 15 minutes on 15 minutes off.  You may switch to heat after 24 hours.GENERAL RISKS AND COMPLICATIONS  What are the risk, side effects and possible  complications? Generally speaking, most procedures are safe.  However, with any procedure there are risks, side effects, and the possibility of complications.  The risks and complications are dependent upon the sites that are lesioned, or the type of nerve block to be performed.  The closer the procedure is to the spine, the more serious the risks are.  Great care is taken when placing the radio frequency needles, block needles or lesioning probes, but sometimes complications can occur.  Infection: Any time there is an injection through the skin, there is a risk of infection.  This is why sterile conditions are used for these blocks.  There are four possible types of infection.  Localized skin infection.  Central Nervous System Infection-This can be in the form of Meningitis, which can be deadly.  Epidural Infections-This can be in the form of an epidural abscess, which can cause pressure inside of the spine, causing compression of the spinal cord with subsequent paralysis. This would require an emergency surgery to decompress, and there are no guarantees that the patient would recover from the paralysis.  Discitis-This is an infection of the intervertebral discs.  It occurs in about 1% of discography procedures.  It is difficult to treat and it may lead to surgery.        2. Pain: the needles have to go through skin and soft tissues, will cause soreness.       3. Damage to internal structures:  The nerves to be lesioned may be near blood vessels or    other nerves which can be potentially damaged.       4. Bleeding: Bleeding is more common if the patient is taking blood thinners such as  aspirin, Coumadin, Ticiid, Plavix, etc., or if he/she have some genetic predisposition  such as hemophilia. Bleeding into the spinal canal can cause compression of the spinal  cord with subsequent paralysis.  This would require an emergency surgery to  decompress and there are no guarantees that the patient would  recover from the  paralysis.       5. Pneumothorax:  Puncturing of a lung is a possibility, every time a needle is introduced in  the area of the chest or upper back.  Pneumothorax refers to free air around the  collapsed lung(s), inside of the thoracic cavity (chest cavity).  Another two possible  complications related to a similar event would include: Hemothorax and Chylothorax.   These are variations of the Pneumothorax, where instead of air around the collapsed  lung(s), you may have blood or chyle, respectively.       6. Spinal headaches: They may occur with any procedures in the area of the spine.       7. Persistent CSF (Cerebro-Spinal Fluid) leakage: This is a rare problem, but may occur  with prolonged intrathecal or epidural catheters either due to the formation of a fistulous  track or a dural tear.  8. Nerve damage: By working so close to the spinal cord, there is always a possibility of  nerve damage, which could be as serious as a permanent spinal cord injury with  paralysis.       9. Death:  Although rare, severe deadly allergic reactions known as "Anaphylactic  reaction" can occur to any of the medications used.      10. Worsening of the symptoms:  We can always make thing worse.  What are the chances of something like this happening? Chances of any of this occuring are extremely low.  By statistics, you have more of a chance of getting killed in a motor vehicle accident: while driving to the hospital than any of the above occurring .  Nevertheless, you should be aware that they are possibilities.  In general, it is similar to taking a shower.  Everybody knows that you can slip, hit your head and get killed.  Does that mean that you should not shower again?  Nevertheless always keep in mind that statistics do not mean anything if you happen to be on the wrong side of them.  Even if a procedure has a 1 (one) in a 1,000,000 (million) chance of going wrong, it you happen to be that  one..Also, keep in mind that by statistics, you have more of a chance of having something go wrong when taking medications.  Who should not have this procedure? If you are on a blood thinning medication (e.g. Coumadin, Plavix, see list of "Blood Thinners"), or if you have an active infection going on, you should not have the procedure.  If you are taking any blood thinners, please inform your physician.  How should I prepare for this procedure?  Do not eat or drink anything at least six hours prior to the procedure.  Bring a driver with you .  It cannot be a taxi.  Come accompanied by an adult that can drive you back, and that is strong enough to help you if your legs get weak or numb from the local anesthetic.  Take all of your medicines the morning of the procedure with just enough water to swallow them.  If you have diabetes, make sure that you are scheduled to have your procedure done first thing in the morning, whenever possible.  If you have diabetes, take only half of your insulin dose and notify our nurse that you have done so as soon as you arrive at the clinic.  If you are diabetic, but only take blood sugar pills (oral hypoglycemic), then do not take them on the morning of your procedure.  You may take them after you have had the procedure.  Do not take aspirin or any aspirin-containing medications, at least eleven (11) days prior to the procedure.  They may prolong bleeding.  Wear loose fitting clothing that may be easy to take off and that you would not mind if it got stained with Betadine or blood.  Do not wear any jewelry or perfume  Remove any nail coloring.  It will interfere with some of our monitoring equipment.  NOTE: Remember that this is not meant to be interpreted as a complete list of all possible complications.  Unforeseen problems may occur.  BLOOD THINNERS The following drugs contain aspirin or other products, which can cause increased bleeding during  surgery and should not be taken for 2 weeks prior to and 1 week after surgery.  If you should need take something for relief of minor  pain, you may take acetaminophen which is found in Tylenol,m Datril, Anacin-3 and Panadol. It is not blood thinner. The products listed below are.  Do not take any of the products listed below in addition to any listed on your instruction sheet.  A.P.C or A.P.C with Codeine Codeine Phosphate Capsules #3 Ibuprofen Ridaura  ABC compound Congesprin Imuran rimadil  Advil Cope Indocin Robaxisal  Alka-Seltzer Effervescent Pain Reliever and Antacid Coricidin or Coricidin-D  Indomethacin Rufen  Alka-Seltzer plus Cold Medicine Cosprin Ketoprofen S-A-C Tablets  Anacin Analgesic Tablets or Capsules Coumadin Korlgesic Salflex  Anacin Extra Strength Analgesic tablets or capsules CP-2 Tablets Lanoril Salicylate  Anaprox Cuprimine Capsules Levenox Salocol  Anexsia-D Dalteparin Magan Salsalate  Anodynos Darvon compound Magnesium Salicylate Sine-off  Ansaid Dasin Capsules Magsal Sodium Salicylate  Anturane Depen Capsules Marnal Soma  APF Arthritis pain formula Dewitt's Pills Measurin Stanback  Argesic Dia-Gesic Meclofenamic Sulfinpyrazone  Arthritis Bayer Timed Release Aspirin Diclofenac Meclomen Sulindac  Arthritis pain formula Anacin Dicumarol Medipren Supac  Analgesic (Safety coated) Arthralgen Diffunasal Mefanamic Suprofen  Arthritis Strength Bufferin Dihydrocodeine Mepro Compound Suprol  Arthropan liquid Dopirydamole Methcarbomol with Aspirin Synalgos  ASA tablets/Enseals Disalcid Micrainin Tagament  Ascriptin Doan's Midol Talwin  Ascriptin A/D Dolene Mobidin Tanderil  Ascriptin Extra Strength Dolobid Moblgesic Ticlid  Ascriptin with Codeine Doloprin or Doloprin with Codeine Momentum Tolectin  Asperbuf Duoprin Mono-gesic Trendar  Aspergum Duradyne Motrin or Motrin IB Triminicin  Aspirin plain, buffered or enteric coated Durasal Myochrisine Trigesic  Aspirin  Suppositories Easprin Nalfon Trillsate  Aspirin with Codeine Ecotrin Regular or Extra Strength Naprosyn Uracel  Atromid-S Efficin Naproxen Ursinus  Auranofin Capsules Elmiron Neocylate Vanquish  Axotal Emagrin Norgesic Verin  Azathioprine Empirin or Empirin with Codeine Normiflo Vitamin E  Azolid Emprazil Nuprin Voltaren  Bayer Aspirin plain, buffered or children's or timed BC Tablets or powders Encaprin Orgaran Warfarin Sodium  Buff-a-Comp Enoxaparin Orudis Zorpin  Buff-a-Comp with Codeine Equegesic Os-Cal-Gesic   Buffaprin Excedrin plain, buffered or Extra Strength Oxalid   Bufferin Arthritis Strength Feldene Oxphenbutazone   Bufferin plain or Extra Strength Feldene Capsules Oxycodone with Aspirin   Bufferin with Codeine Fenoprofen Fenoprofen Pabalate or Pabalate-SF   Buffets II Flogesic Panagesic   Buffinol plain or Extra Strength Florinal or Florinal with Codeine Panwarfarin   Buf-Tabs Flurbiprofen Penicillamine   Butalbital Compound Four-way cold tablets Penicillin   Butazolidin Fragmin Pepto-Bismol   Carbenicillin Geminisyn Percodan   Carna Arthritis Reliever Geopen Persantine   Carprofen Gold's salt Persistin   Chloramphenicol Goody's Phenylbutazone   Chloromycetin Haltrain Piroxlcam   Clmetidine heparin Plaquenil   Cllnoril Hyco-pap Ponstel   Clofibrate Hydroxy chloroquine Propoxyphen         Before stopping any of these medications, be sure to consult the physician who ordered them.  Some, such as Coumadin (Warfarin) are ordered to prevent or treat serious conditions such as "deep thrombosis", "pumonary embolisms", and other heart problems.  The amount of time that you may need off of the medication may also vary with the medication and the reason for which you were taking it.  If you are taking any of these medications, please make sure you notify your pain physician before you undergo any procedures.         Facet Joint Block, Care After Refer to this sheet in the  next few weeks. These instructions provide you with information on caring for yourself after your procedure. Your health care provider may also give you more specific instructions. Your treatment has been planned according  to current medical practices, but problems sometimes occur. Call your health care provider if you have any problems or questions after your procedure. HOME CARE INSTRUCTIONS   Keep track of the amount of pain relief you feel and how long it lasts.  Limit pain medicine within the first 4-6 hours after the procedure as directed by your health care provider.  Resume taking dietary supplements and medicines as directed by your health care provider.  You may resume your regular diet.  Do not apply heat near or over the injection site(s) for 24 hours.   Do not take a bath or soak in water (such as a pool or lake) for 24 hours.  Do not drive for 24 hours unless approved by your health care provider.  Avoid strenuous activity for 24 hours.  Remove your bandages the morning after the procedure.   If the injection site is tender, applying an ice pack may relieve some tenderness. To do this:  Put ice in a bag.  Place a towel between your skin and the bag.  Leave the ice on for 15-20 minutes, 3-4 times a day.  Keep follow-up appointments as directed by your health care provider. SEEK MEDICAL CARE IF:   Your pain is not controlled by your medicines.   There is drainage from the injection site.   There is significant bleeding or swelling at the injection site.  You have diabetes and your blood sugar is above 180 mg/dL. SEEK IMMEDIATE MEDICAL CARE IF:   You develop a fever of 101F (38.3C) or greater.   You have worsening pain or swelling around the injection site.   You have red streaking around the injection site.   You develop severe pain that is not controlled by your medicines.   You develop a headache, stiff neck, nausea, or vomiting.   Your  eyes become very sensitive to light.   You have weakness, paralysis, or tingling in your arms or legs that was not present before the procedure.   You develop difficulty urinating or breathing.    This information is not intended to replace advice given to you by your health care provider. Make sure you discuss any questions you have with your health care provider.   Document Released: 07/12/2012 Document Revised: 08/16/2014 Document Reviewed: 07/12/2012 Elsevier Interactive Patient Education 2016 [employer] Facet Joint Block The facet joints connect the bones of the spine (vertebrae). They make it possible for you to bend, twist, and make other movements with your spine. They also prevent you from overbending, overtwisting, and making other excessive movements.  A facet joint block is a procedure where a numbing medicine (anesthetic) is injected into a facet joint. Often, a type of anti-inflammatory medicine called a steroid is also injected. A facet joint block may be done for two reasons:   Diagnosis. A facet joint block may be done as a test to see whether neck or back pain is caused by a worn-down or infected facet joint. If the pain gets better after a facet joint block, it means the pain is probably coming from the facet joint. If the pain does not get better, it means the pain is probably not coming from the facet joint.   Therapy. A facet joint block may be done to relieve neck or back pain caused by a facet joint. A facet joint block is only done as a therapy if the pain does not improve with medicine, exercise programs, physical therapy, and other forms of  pain management. LET [facility] CARE PROVIDER KNOW ABOUT:   Any allergies you have.   All medicines you are taking, including vitamins, herbs, eyedrops, and over-the-counter medicines and creams.   Previous problems you or members of your family have had with the use of anesthetics.   Any blood disorders you have  had.   Other health problems you have. RISKS AND COMPLICATIONS Generally, having a facet joint block is safe. However, as with any procedure, complications can occur. Possible complications associated with having a facet joint block include:   Bleeding.   Injury to a nerve near the injection site.   Pain at the injection site.   Weakness or numbness in areas controlled by nerves near the injection site.   Infection.   Temporary fluid retention.   Allergic reaction to anesthetics or medicines used during the procedure. BEFORE THE PROCEDURE   Follow your health care provider's instructions if you are taking dietary supplements or medicines. You may need to stop taking them or reduce your dosage.   Do not take any new dietary supplements or medicines without asking your health care provider first.   Follow your health care provider's instructions about eating and drinking before the procedure. You may need to stop eating and drinking several hours before the procedure.   Arrange to have an adult drive you home after the procedure. PROCEDURE  You may need to remove your clothing and dress in an open-back gown so that your health care provider can access your spine.   The procedure will be done while you are lying on an X-ray table. Most of the time you will be asked to lie on your stomach, but you may be asked to lie in a different position if an injection will be made in your neck.   Special machines will be used to monitor your oxygen levels, heart rate, and blood pressure.   If an injection will be made in your neck, an intravenous (IV) tube will be inserted into one of your veins. Fluids and medicine will flow directly into your body through the IV tube.   The area over the facet joint where the injection will be made will be cleaned with an antiseptic soap. The surrounding skin will be covered with sterile drapes.   An anesthetic will be applied to your skin to  make the injection area numb. You may feel a temporary stinging or burning sensation.   A video X-ray machine will be used to locate the joint. A contrast dye may be injected into the facet joint area to help with locating the joint.   When the joint is located, an anesthetic medicine will be injected into the joint through the needle.   Your health care provider will ask you whether you feel pain relief. If you do feel relief, a steroid may be injected to provide pain relief for a longer period of time. If you do not feel relief or feel only partial relief, additional injections of an anesthetic may be made in other facet joints.   The needle will be removed, the skin will be cleansed, and bandages will be applied.  AFTER THE PROCEDURE   You will be observed for 15-30 minutes before being allowed to go home. Do not drive. Have an adult drive you or take a taxi or public transportation instead.   If you feel pain relief, the pain will return in several hours or days when the anesthetic wears off.   You  may feel pain relief 2-14 days after the procedure. The amount of time this relief lasts varies from person to person.   It is normal to feel some tenderness over the injected area(s) for 2 days following the procedure.   If you have diabetes, you may have a temporary increase in blood sugar.   This information is not intended to replace advice given to you by your health care provider. Make sure you discuss any questions you have with your health care provider.   Document Released: 12/15/2006 Document Revised: 08/16/2014 Document Reviewed: 05/15/2012 Elsevier Interactive Patient Education [employer].

## 2015-09-29 NOTE — Progress Notes (Signed)
Safety precautions to be maintained throughout the outpatient stay will include: orient to surroundings, keep bed in low position, maintain call bell within reach at all times, provide assistance with transfer out of bed and ambulation.  

## 2015-09-29 NOTE — Progress Notes (Signed)
   Subjective:    Patient ID: Justin Patel, male    DOB: 05/18/1951, 65 y.o.   MRN: [id number]  HPI the patient is a 65 year old gentleman who returns to pain management for further evaluation and treatment of pain involving the lower back and lower extremity region predominantly with pain involving the neck and upper extremity region of lesser degree. The patient has had significant improvement of his pain following intercostal nerve blocks performed far fractured ribs and states that the predominant pain at the present time involves the lower back which is aggravated by standing walking twisting turning maneuvers. The patient has had significant improvement with lumbar facet, medial branch nerve, blocks. We will schedule patient for lumbar facet, medial branch nerve, blocks at time return appointment and will request insurance approval for radiofrequency rhizolysis lumbar facet, medial branch nerves. The patient is with understanding and agreed to suggested treatment plan. The patient will continue oxycodone as prescribed this time as well.  Review of Systems     Objective:   Physical Exam   There was tenderness of the splenius capitis and occipitalis musculature regions of mild degree with mild tenderness over the cervical facet cervical paraspinal musculature region. Palpation of the acromioclavicular and glenohumeral joint regions reproduces minimal discomfort. Patient appeared to be with slightly decreased grip strength and Tinel and Phalen's maneuver were with minimal increase of pain. There was tenderness over the thoracic facet thoracic paraspinal musculature region with no crepitus of the thoracic region noted. Lateral bending rotation extension and palpation over the lumbar facets reproduced moderately severe discomfort left as well as on the right. There was moderate tenderness of the PSIS and PII S region as well as the gluteal and piriformis musculature region with mild tenderness of the  greater trochanteric region iliotibial band region. Straight leg raising was tolerates approximately 20 without a definite increased pain with dorsiflexion noted. EHL strength appeared to be decreased and no definite sensory deficit or dermatomal dystrophy she was detected. There was negative clonus negative Homans. 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 Lateral recess narrowing at L3-4 through L4-5  Lumbar facet syndrome  Lumbar radiculopathy  Sacroiliac joint dysfunction  Status post cervical fusion       PLAN    Continue present medications oxycodone acetaminophen  Lumbar facet, medial branch nerve, blocks to be performed at time of return appointment  F/U PCP Dr Ronette Deter  for evaliation of  BP and general medical  condition as previously discussed  F/U surgical evaluation. May consider pending follow-up evaluations  F/U neurological evaluation. May consider pending follow-up evaluations  Ask Angie and secretaries if you have been approved for radiofrequency rhizolysis lumbar facets medial branch nerves  Psych evaluation as discussed. Follow-up with psych evaluation as scheduled with Dr.Perrin   May consider radiofrequency rhizolysis or intraspinal procedures pending response to present treatment and F/U evaluation   Patient to call [facility] should patient have concerns prior to scheduled return appointment

## 2015-10-02 ENCOUNTER — Encounter: Payer: Self-pay | Admitting: Internal Medicine

## 2015-10-02 ENCOUNTER — Ambulatory Visit (INDEPENDENT_AMBULATORY_CARE_PROVIDER_SITE_OTHER): Payer: BC Managed Care – PPO | Admitting: Internal Medicine

## 2015-10-02 VITALS — BP 125/76 | HR 74 | Temp 98.0°F | Ht 68.0 in | Wt 191.0 lb

## 2015-10-02 DIAGNOSIS — R509 Fever, unspecified: Secondary | ICD-10-CM

## 2015-10-02 DIAGNOSIS — J209 Acute bronchitis, unspecified: Secondary | ICD-10-CM | POA: Diagnosis not present

## 2015-10-02 LAB — POCT INFLUENZA A/B
Influenza A, POC: NEGATIVE
Influenza B, POC: NEGATIVE

## 2015-10-02 MED ORDER — AZITHROMYCIN 250 MG PO TABS: ORAL_TABLET | ORAL | Status: DC

## 2015-10-02 MED ORDER — PREDNISONE 10 MG PO TABS: ORAL_TABLET | ORAL | Status: DC

## 2015-10-02 NOTE — Patient Instructions (Addendum)
Your symptoms are consistent with bronchitis. This is most likely due to a viral infection. To be safe, we will start Azithromycin, which will cover some bacteria that your Ceftin missed. Use Oxycodone as needed for pain or cough. Rest, and make sure you are getting adequate fluids. Please call if symptoms are not improving.  Acute Bronchitis Bronchitis is inflammation of the airways that extend from the windpipe into the lungs (bronchi). The inflammation often causes mucus to develop. This leads to a cough, which is the most common symptom of bronchitis.  In acute bronchitis, the condition usually develops suddenly and goes away over time, usually in a couple weeks. Smoking, allergies, and asthma can make bronchitis worse. Repeated episodes of bronchitis may cause further lung problems.  CAUSES Acute bronchitis is most often caused by the same virus that causes a cold. The virus can spread from person to person (contagious) through coughing, sneezing, and touching contaminated objects. SIGNS AND SYMPTOMS   Cough.   Fever.   Coughing up mucus.   Body aches.   Chest congestion.   Chills.   Shortness of breath.   Sore throat.  DIAGNOSIS  Acute bronchitis is usually diagnosed through a physical exam. Your health care provider will also ask you questions about your medical history. Tests, such as chest X-rays, are sometimes done to rule out other conditions.  TREATMENT  Acute bronchitis usually goes away in a couple weeks. Oftentimes, no medical treatment is necessary. Medicines are sometimes given for relief of fever or cough. Antibiotic medicines are usually not needed but may be prescribed in certain situations. In some cases, an inhaler may be recommended to help reduce shortness of breath and control the cough. A cool mist vaporizer may also be used to help thin bronchial secretions and make it easier to clear the chest.  HOME CARE INSTRUCTIONS  Get plenty of rest.    Drink enough fluids to keep your urine clear or pale yellow (unless you have a medical condition that requires fluid restriction). Increasing fluids may help thin your respiratory secretions (sputum) and reduce chest congestion, and it will prevent dehydration.   Take medicines only as directed by your health care provider.  If you were prescribed an antibiotic medicine, finish it all even if you start to feel better.  Avoid smoking and secondhand smoke. Exposure to cigarette smoke or irritating chemicals will make bronchitis worse. If you are a smoker, consider using nicotine gum or skin patches to help control withdrawal symptoms. Quitting smoking will help your lungs heal faster.   Reduce the chances of another bout of acute bronchitis by washing your hands frequently, avoiding people with cold symptoms, and trying not to touch your hands to your mouth, nose, or eyes.   Keep all follow-up visits as directed by your health care provider.  SEEK MEDICAL CARE IF: Your symptoms do not improve after 1 week of treatment.  SEEK IMMEDIATE MEDICAL CARE IF:  You develop an increased fever or chills.   You have chest pain.   You have severe shortness of breath.  You have bloody sputum.   You develop dehydration.  You faint or repeatedly feel like you are going to pass out.  You develop repeated vomiting.  You develop a severe headache. MAKE SURE YOU:   Understand these instructions.  Will watch your condition.  Will get help right away if you are not doing well or get worse.   This information is not intended to replace advice given to  you by your health care provider. Make sure you discuss any questions you have with your health care provider.   Document Released: 09/02/2004 Document Revised: 08/16/2014 Document Reviewed: 01/16/2013 Elsevier Interactive Patient Education [employer].

## 2015-10-02 NOTE — Progress Notes (Signed)
Pre visit review using our clinic review tool, if applicable. No additional management support is needed unless otherwise documented below in the visit note. 

## 2015-10-02 NOTE — Assessment & Plan Note (Signed)
Symptoms and exam c/w bronchitis. Will start steroid taper and add Azithromycin, as he was just treated with Cefuroxime. He can use oxycodone for cough. Recommended CXR but he declines. Follow up prn if symptoms are not continuing to improve.

## 2015-10-02 NOTE — Progress Notes (Signed)
Subjective:    Patient ID: Justin Patel, male    DOB: 12-20-1950, 65 y.o.   MRN: [id number]  HPI  65YO male presents for acute visit.  Fever yesterday to 102F. Cough and congestion over last 2 weeks. Cough productive of thick brownish mucous. Recently took Ceftin x 10 days after treatment with Dr. Primus Bravo. Completed this week. Feels short of breath with some wheezing. No chest pain.  Wt Readings from Last 3 Encounters:  10/02/15 191 lb (86.637 kg)  09/29/15 172 lb (78.019 kg)  09/08/15 172 lb (78.019 kg)   BP Readings from Last 3 Encounters:  10/02/15 125/76  09/29/15 113/75  09/08/15 87/54    Past Medical History  Diagnosis Date  . Hypertension   . Anxiety   . Sleep apnea     hx not now since wt loss  . Diabetes mellitus   . Stones in the urinary tract   . GERD (gastroesophageal reflux disease)   . H/O hiatal hernia   . Headache(784.0)   . Arthritis   . 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 Gastric ulcer   . Degenerative disc disease   . Sacral fracture, closed ([place])   . Syncope and collapse   . Chronic kidney disease   . Heart murmur   . Hyperlipidemia   . Hx of congestive heart failure   . Iron deficiency anemia 02/28/2015   Family History  Problem Relation Age of Onset  . Dementia Mother 25  . Heart disease Father 39  . Diabetes Father   . Lymphoma Sister     lymphoma, stage 4  . Ovarian cancer Sister 51    Ovarian  . Lupus Sister    Past Surgical History  Procedure Laterality Date  . Hernia repair  2000    hiatal  . Gastric bypass  2010    [place]  . Knee arthroscopy      bilateral, left x 2  . Osteotomy  2001    left  . Tonsillectomy    . Appendectomy    . Nasal sinus surgery      x5  . Uvulopalatoplasty  2011  . Anterior cervical decomp/discectomy fusion  01/26/2012    Procedure: ANTERIOR CERVICAL DECOMPRESSION/DISCECTOMY FUSION 3 LEVELS;  Surgeon: Floyce Stakes, MD;  Location: [facility];  Service:  Neurosurgery;  Laterality: N/A;  Cervical three-four Cervical four-five Cervical five-six Cervical six-seven , Anterior cervical decompression/diskectomy, fusion, plate  . Total knee arthroplasty Left 05/2014    Dr. Marry Guan  . Cardiac catheterization      [facility], normal  . Cardiac catheterization      [place]  . Joint replacement     Social History   Social History  . Marital Status: Married    Spouse Name: N/A  . Number of Children: N/A  . Years of Education: N/A   Social History Main Topics  . Smoking status: Never Smoker   . Smokeless tobacco: Never Used  . Alcohol Use: 0.6 oz/week    1 Glasses of wine per week     Comment: occasional  . Drug Use: No  . Sexual Activity:    Partners: Female   Other Topics Concern  . None   Social History Narrative   Lives in [place] with wife, [place]. 2 sons, William Hamburger, Lennette Bihari 35.. 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 Negative for chills and activity change.  HENT: Positive for congestion, postnasal drip and rhinorrhea. Negative for ear discharge, ear pain, hearing loss, nosebleeds, sinus pressure, sneezing, sore throat, tinnitus, trouble swallowing and voice change.   Eyes: Negative for discharge, redness, itching and visual disturbance.  Respiratory: Positive for cough, shortness of breath and wheezing. Negative for chest tightness and stridor.   Cardiovascular: Negative for chest pain and leg swelling.  Musculoskeletal: Negative for myalgias, arthralgias, neck pain and neck stiffness.  Skin: Negative for color change and rash.  Neurological: Negative for dizziness, facial asymmetry and headaches.  Psychiatric/Behavioral: Negative for sleep  disturbance.       Objective:    BP 125/76 mmHg  Pulse 74  Temp(Src) 98 F (36.7 C) (Oral)  Ht 5\' 8"  (1.727 m)  Wt 191 lb (86.637 kg)  BMI 29.05 kg/m2  SpO2 95% Physical Exam  Constitutional: He is oriented to person, place, and time. He appears well-developed and well-nourished. No distress.  HENT:  Head: Normocephalic and atraumatic.  Right Ear: External ear normal.  Left Ear: External ear normal.  Nose: Nose normal.  Mouth/Throat: Oropharynx is clear and moist. No oropharyngeal exudate.  Eyes: Conjunctivae and EOM are normal. Pupils are equal, round, and reactive to light. Right eye exhibits no discharge. Left eye exhibits no discharge. No scleral icterus.  Neck: Normal range of motion. Neck supple. No tracheal deviation present. No thyromegaly present.  Cardiovascular: Normal rate, regular rhythm and normal heart sounds.  Exam reveals no gallop and no friction rub.   No murmur heard. Pulmonary/Chest: Effort normal. No accessory muscle usage. No tachypnea. No respiratory distress. He has no decreased breath sounds. He has wheezes. He has rhonchi (scattered). He has no rales. He exhibits no tenderness.  Musculoskeletal: Normal range of motion. He exhibits no edema.  Lymphadenopathy:    He has no cervical adenopathy.  Neurological: He is alert and oriented to person, place, and time. No cranial nerve deficit. Coordination normal.  Skin: Skin is warm and dry. No rash noted. He is not diaphoretic. No erythema. No pallor.  Psychiatric: He has a normal mood and affect. His behavior is normal. Judgment and thought content normal.          Assessment & Plan:   Problem List Items Addressed This Visit      Unprioritized   Acute bronchitis - Primary    Symptoms and exam c/w bronchitis. Will start steroid taper and add Azithromycin, as he was just treated with Cefuroxime. He can use oxycodone for cough. Recommended CXR but he declines. Follow up prn if symptoms are not continuing  to improve.       Other Visit Diagnoses    Fever, unspecified        Relevant Orders    POCT Influenza A/B (Completed)        Return if symptoms worsen or fail to improve.  Ronette Deter, MD Internal Medicine [facility]

## 2015-10-08 ENCOUNTER — Ambulatory Visit: Payer: BC Managed Care – PPO | Attending: Pain Medicine | Admitting: Pain Medicine

## 2015-10-08 ENCOUNTER — Encounter: Payer: Self-pay | Admitting: Pain Medicine

## 2015-10-08 VITALS — BP 114/77 | HR 82 | Temp 98.5°F | Resp 15 | Ht 68.0 in | Wt 173.0 lb

## 2015-10-08 DIAGNOSIS — M47816 Spondylosis without myelopathy or radiculopathy, lumbar region: Secondary | ICD-10-CM

## 2015-10-08 DIAGNOSIS — M5416 Radiculopathy, lumbar region: Secondary | ICD-10-CM

## 2015-10-08 DIAGNOSIS — M545 Low back pain: Secondary | ICD-10-CM | POA: Diagnosis present

## 2015-10-08 DIAGNOSIS — Q761 Klippel-Feil syndrome: Secondary | ICD-10-CM

## 2015-10-08 DIAGNOSIS — M2578 Osteophyte, vertebrae: Secondary | ICD-10-CM | POA: Diagnosis not present

## 2015-10-08 DIAGNOSIS — M533 Sacrococcygeal disorders, not elsewhere classified: Secondary | ICD-10-CM

## 2015-10-08 DIAGNOSIS — M5136 Other intervertebral disc degeneration, lumbar region: Secondary | ICD-10-CM | POA: Insufficient documentation

## 2015-10-08 DIAGNOSIS — M79606 Pain in leg, unspecified: Secondary | ICD-10-CM | POA: Diagnosis present

## 2015-10-08 DIAGNOSIS — M51369 Other intervertebral disc degeneration, lumbar region without mention of lumbar back pain or lower extremity pain: Secondary | ICD-10-CM

## 2015-10-08 DIAGNOSIS — S2242XA Multiple fractures of ribs, left side, initial encounter for closed fracture: Secondary | ICD-10-CM

## 2015-10-08 MED ORDER — BUPIVACAINE HCL (PF) 0.25 % IJ SOLN
30.0000 mL | Freq: Once | INTRAMUSCULAR | Status: DC
Start: 2015-10-08 — End: 2015-11-12

## 2015-10-08 MED ORDER — FENTANYL CITRATE (PF) 100 MCG/2ML IJ SOLN
INTRAMUSCULAR | Status: AC
  Administered 2015-10-08: 100 ug via INTRAVENOUS
  Filled 2015-10-08: qty 2

## 2015-10-08 MED ORDER — ORPHENADRINE CITRATE 30 MG/ML IJ SOLN
INTRAMUSCULAR | Status: AC
  Administered 2015-10-08: 09:00:00
  Filled 2015-10-08: qty 2

## 2015-10-08 MED ORDER — TRIAMCINOLONE ACETONIDE 40 MG/ML IJ SUSP
INTRAMUSCULAR | Status: AC
  Administered 2015-10-08: 09:00:00
  Filled 2015-10-08: qty 1

## 2015-10-08 MED ORDER — CEFUROXIME AXETIL 250 MG PO TABS: 250.0000 mg | ORAL_TABLET | Freq: Two times a day (BID) | ORAL | Status: DC

## 2015-10-08 MED ORDER — MIDAZOLAM HCL 5 MG/5ML IJ SOLN
INTRAMUSCULAR | Status: AC
  Administered 2015-10-08: 4 mg via INTRAVENOUS
  Filled 2015-10-08: qty 5

## 2015-10-08 MED ORDER — LACTATED RINGERS IV SOLN: 1000.0000 mL | INTRAVENOUS | Status: DC

## 2015-10-08 MED ORDER — ORPHENADRINE CITRATE 30 MG/ML IJ SOLN: 60.0000 mg | Freq: Once | INTRAMUSCULAR | Status: DC

## 2015-10-08 MED ORDER — TRIAMCINOLONE ACETONIDE 40 MG/ML IJ SUSP: 40.0000 mg | Freq: Once | INTRAMUSCULAR | Status: DC

## 2015-10-08 MED ORDER — MIDAZOLAM HCL 5 MG/5ML IJ SOLN: 5.0000 mg | Freq: Once | INTRAMUSCULAR | Status: DC

## 2015-10-08 MED ORDER — BUPIVACAINE HCL (PF) 0.25 % IJ SOLN
INTRAMUSCULAR | Status: AC
  Administered 2015-10-08: 09:00:00
  Filled 2015-10-08: qty 30

## 2015-10-08 MED ORDER — FENTANYL CITRATE (PF) 100 MCG/2ML IJ SOLN: 100.0000 ug | Freq: Once | INTRAMUSCULAR | Status: DC

## 2015-10-08 MED ORDER — CEFAZOLIN SODIUM 1-5 GM-% IV SOLN: 1.0000 g | Freq: Once | INTRAVENOUS | Status: DC

## 2015-10-08 MED ORDER — CEFAZOLIN SODIUM 1 G IJ SOLR
INTRAMUSCULAR | Status: AC
  Administered 2015-10-08: 09:00:00 via INTRAVENOUS
  Filled 2015-10-08: qty 10

## 2015-10-08 NOTE — Progress Notes (Signed)
Subjective:    Patient ID: Justin Patel, male    DOB: 1950/10/06, 65 y.o.   MRN: [id number]  HPI  PROCEDURE PERFORMED: Lumbar facet (medial branch block)   NOTE: The patient is a 65 y.o. male who returns to [facility] for further evaluation and treatment of pain involving the lumbar and lower extremity region. 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 Lateral recess narrowing at L3-4 through L4-5. There is concern regarding significant component of patient's pain being due to facet arthropathy with facet syndrome The risks, benefits, and expectations of the procedure have been discussed and explained to the patient who was understanding and in agreement with suggested treatment plan. We will proceed with interventional treatment as discussed and as explained to the patient who was understanding and wished to proceed with procedure as planned.   DESCRIPTION OF PROCEDURE: Lumbar facet (medial branch block) with IV Versed, IV fentanyl conscious sedation, EKG, blood pressure, pulse, and pulse oximetry monitoring. The procedure was performed with the patient in the prone position. Betadine prep of proposed entry site performed.   NEEDLE PLACEMENT AT: Left L 2 lumbar facet (medial branch block). Under fluoroscopic guidance with oblique orientation of 15 degrees, a 22-gauge needle was inserted at the L 2 vertebral body level with needle placed at the targeted area of Burton's Eye or Eye of the Scotty Dog with documentation of needle placement in the superior and lateral border of targeted area of Burton's Eye or Eye of the Scotty Dog with oblique orientation of 15 degrees. Following documentation of needle placement at the L 2 vertebral body level, needle placement was then accomplished at the L 3 vertebral body level.   NEEDLE PLACEMENT AT L3, L4, and L5 VERTEBRAL BODY LEVELS ON THE LEFT  SIDE The procedure was performed at the L3, L4, and L5 vertebral body levels exactly as was performed at the L 2 vertebral body level utilizing the same technique and under fluoroscopic guidance.  NEEDLE PLACEMENT AT THE SACRAL ALA with AP view of the lumbosacral spine. With the patient in the prone position, Betadine prep of proposed entry site accomplished, a 22 gauge needle was inserted in the region of the sacral ala (groove formed by the superior articulating process of S1 and the sacral wing). Following documentation of needle placement at the sacral ala,   Needle placement was then verified at all levels on lateral view. Following documentation of needle placement at all levels on lateral view and following negative aspiration for heme and CSF, each level was injected with 1 mL of 0.25% bupivacaine with Kenalog.     LUMBAR FACET, MEDIAL BRANCH NERVE, BLOCKS PERFORMED ON THE RIGHT SIDE   The procedure was performed on the right side exactly as was performed on the left side at the same levels and utilizing the same technique under fluoroscopic guidance.  Myoneural block injections of the cervical region Following Betadine prep of proposed entry site a 22-gauge needle was inserted in the cervical musculature region and following negative aspiration 2 cc of 0.25% bupivacaine with Norflex was injected for myoneural block injection of the cervical region times two.  The patient tolerated the procedure well.   A total of 40 mg of Kenalog was utilized for the procedure.   PLAN:  1. Medications: The patient will continue presently prescribed medication oxycodone acetaminophen 2. May consider modification of treatment regimen at time of return  appointment pending response to treatment rendered on today's visit. 3. The patient is to follow-up with primary care physician Dr. Ronette Deter  for further evaluation of blood pressure and general medical condition status post steroid injection  performed on today's visit. 4. Surgical follow-up evaluation. Has been addressed 5. Neurological follow-up evaluation. May consider [facility] EMG studies 6. The patient may be candidate for radiofrequency procedures, implantation type procedures, and other treatment pending response to treatment and follow-up evaluation. 7. The patient has been advised to call the [facility] Management Center prior to scheduled return appointment should there be significant change in condition or should patient have other concerns regarding condition prior to scheduled return appointment.  The patient is understanding and in agreement with suggested treatment plan.   Review of Systems     Objective:   Physical Exam        Assessment & Plan:

## 2015-10-08 NOTE — Patient Instructions (Addendum)
PLAN   Continue present medications oxycodone acetaminophen. Please obtain Ceftin antibiotic today and begin taking Ceftin antibiotic today as prescribed   F/U PCP Dr Ronette Deter  for evaliation of  BP and general medical  condition  F/U surgical evaluation. May consider pending follow-up evaluations  Ask the nurses and secretaries if your insurance has approved you for radiofrequency of the lumbar region  F/U neurological evaluation. May consider pending follow-up evaluations  May consider radiofrequency rhizolysis or intraspinal procedures pending response to present treatment and F/U evaluation   Patient to call [facility] should patient have concerns prior to scheduled return appointmentGENERAL RISKS AND COMPLICATIONS  What are the risk, side effects and possible complications? Generally speaking, most procedures are safe.  However, with any procedure there are risks, side effects, and the possibility of complications.  The risks and complications are dependent upon the sites that are lesioned, or the type of nerve block to be performed.  The closer the procedure is to the spine, the more serious the risks are.  Great care is taken when placing the radio frequency needles, block needles or lesioning probes, but sometimes complications can occur. 1. Infection: Any time there is an injection through the skin, there is a risk of infection.  This is why sterile conditions are used for these blocks.  There are four possible types of infection. 1. Localized skin infection. 2. Central Nervous System Infection-This can be in the form of Meningitis, which can be deadly. 3. Epidural Infections-This can be in the form of an epidural abscess, which can cause pressure inside of the spine, causing compression of the spinal cord with subsequent paralysis. This would require an emergency surgery to decompress, and there are no guarantees that the patient would recover from the  paralysis. 4. Discitis-This is an infection of the intervertebral discs.  It occurs in about 1% of discography procedures.  It is difficult to treat and it may lead to surgery.        2. Pain: the needles have to go through skin and soft tissues, will cause soreness.       3. Damage to internal structures:  The nerves to be lesioned may be near blood vessels or    other nerves which can be potentially damaged.       4. Bleeding: Bleeding is more common if the patient is taking blood thinners such as  aspirin, Coumadin, Ticiid, Plavix, etc., or if he/she have some genetic predisposition  such as hemophilia. Bleeding into the spinal canal can cause compression of the spinal  cord with subsequent paralysis.  This would require an emergency surgery to  decompress and there are no guarantees that the patient would recover from the  paralysis.       5. Pneumothorax:  Puncturing of a lung is a possibility, every time a needle is introduced in  the area of the chest or upper back.  Pneumothorax refers to free air around the  collapsed lung(s), inside of the thoracic cavity (chest cavity).  Another two possible  complications related to a similar event would include: Hemothorax and Chylothorax.   These are variations of the Pneumothorax, where instead of air around the collapsed  lung(s), you may have blood or chyle, respectively.       6. Spinal headaches: They may occur with any procedures in the area of the spine.       7. Persistent CSF (Cerebro-Spinal Fluid) leakage: This is a rare problem, but may  occur  with prolonged intrathecal or epidural catheters either due to the formation of a fistulous  track or a dural tear.       8. Nerve damage: By working so close to the spinal cord, there is always a possibility of  nerve damage, which could be as serious as a permanent spinal cord injury with  paralysis.       9. Death:  Although rare, severe deadly allergic reactions known as "Anaphylactic  reaction" can  occur to any of the medications used.      10. Worsening of the symptoms:  We can always make thing worse.  What are the chances of something like this happening? Chances of any of this occuring are extremely low.  By statistics, you have more of a chance of getting killed in a motor vehicle accident: while driving to the hospital than any of the above occurring .  Nevertheless, you should be aware that they are possibilities.  In general, it is similar to taking a shower.  Everybody knows that you can slip, hit your head and get killed.  Does that mean that you should not shower again?  Nevertheless always keep in mind that statistics do not mean anything if you happen to be on the wrong side of them.  Even if a procedure has a 1 (one) in a 1,000,000 (million) chance of going wrong, it you happen to be that one..Also, keep in mind that by statistics, you have more of a chance of having something go wrong when taking medications.  Who should not have this procedure? If you are on a blood thinning medication (e.g. Coumadin, Plavix, see list of "Blood Thinners"), or if you have an active infection going on, you should not have the procedure.  If you are taking any blood thinners, please inform your physician.  How should I prepare for this procedure?  Do not eat or drink anything at least six hours prior to the procedure.  Bring a driver with you .  It cannot be a taxi.  Come accompanied by an adult that can drive you back, and that is strong enough to help you if your legs get weak or numb from the local anesthetic.  Take all of your medicines the morning of the procedure with just enough water to swallow them.  If you have diabetes, make sure that you are scheduled to have your procedure done first thing in the morning, whenever possible.  If you have diabetes, take only half of your insulin dose and notify our nurse that you have done so as soon as you arrive at the clinic.  If you are  diabetic, but only take blood sugar pills (oral hypoglycemic), then do not take them on the morning of your procedure.  You may take them after you have had the procedure.  Do not take aspirin or any aspirin-containing medications, at least eleven (11) days prior to the procedure.  They may prolong bleeding.  Wear loose fitting clothing that may be easy to take off and that you would not mind if it got stained with Betadine or blood.  Do not wear any jewelry or perfume  Remove any nail coloring.  It will interfere with some of our monitoring equipment.  NOTE: Remember that this is not meant to be interpreted as a complete list of all possible complications.  Unforeseen problems may occur.  BLOOD THINNERS The following drugs contain aspirin or other products, which can cause increased bleeding  during surgery and should not be taken for 2 weeks prior to and 1 week after surgery.  If you should need take something for relief of minor pain, you may take acetaminophen which is found in Tylenol,m Datril, Anacin-3 and Panadol. It is not blood thinner. The products listed below are.  Do not take any of the products listed below in addition to any listed on your instruction sheet.  A.P.C or A.P.C with Codeine Codeine Phosphate Capsules #3 Ibuprofen Ridaura  ABC compound Congesprin Imuran rimadil  Advil Cope Indocin Robaxisal  Alka-Seltzer Effervescent Pain Reliever and Antacid Coricidin or Coricidin-D  Indomethacin Rufen  Alka-Seltzer plus Cold Medicine Cosprin Ketoprofen S-A-C Tablets  Anacin Analgesic Tablets or Capsules Coumadin Korlgesic Salflex  Anacin Extra Strength Analgesic tablets or capsules CP-2 Tablets Lanoril Salicylate  Anaprox Cuprimine Capsules Levenox Salocol  Anexsia-D Dalteparin Magan Salsalate  Anodynos Darvon compound Magnesium Salicylate Sine-off  Ansaid Dasin Capsules Magsal Sodium Salicylate  Anturane Depen Capsules Marnal Soma  APF Arthritis pain formula Dewitt's Pills  Measurin Stanback  Argesic Dia-Gesic Meclofenamic Sulfinpyrazone  Arthritis Bayer Timed Release Aspirin Diclofenac Meclomen Sulindac  Arthritis pain formula Anacin Dicumarol Medipren Supac  Analgesic (Safety coated) Arthralgen Diffunasal Mefanamic Suprofen  Arthritis Strength Bufferin Dihydrocodeine Mepro Compound Suprol  Arthropan liquid Dopirydamole Methcarbomol with Aspirin Synalgos  ASA tablets/Enseals Disalcid Micrainin Tagament  Ascriptin Doan's Midol Talwin  Ascriptin A/D Dolene Mobidin Tanderil  Ascriptin Extra Strength Dolobid Moblgesic Ticlid  Ascriptin with Codeine Doloprin or Doloprin with Codeine Momentum Tolectin  Asperbuf Duoprin Mono-gesic Trendar  Aspergum Duradyne Motrin or Motrin IB Triminicin  Aspirin plain, buffered or enteric coated Durasal Myochrisine Trigesic  Aspirin Suppositories Easprin Nalfon Trillsate  Aspirin with Codeine Ecotrin Regular or Extra Strength Naprosyn Uracel  Atromid-S Efficin Naproxen Ursinus  Auranofin Capsules Elmiron Neocylate Vanquish  Axotal Emagrin Norgesic Verin  Azathioprine Empirin or Empirin with Codeine Normiflo Vitamin E  Azolid Emprazil Nuprin Voltaren  Bayer Aspirin plain, buffered or children's or timed BC Tablets or powders Encaprin Orgaran Warfarin Sodium  Buff-a-Comp Enoxaparin Orudis Zorpin  Buff-a-Comp with Codeine Equegesic Os-Cal-Gesic   Buffaprin Excedrin plain, buffered or Extra Strength Oxalid   Bufferin Arthritis Strength Feldene Oxphenbutazone   Bufferin plain or Extra Strength Feldene Capsules Oxycodone with Aspirin   Bufferin with Codeine Fenoprofen Fenoprofen Pabalate or Pabalate-SF   Buffets II Flogesic Panagesic   Buffinol plain or Extra Strength Florinal or Florinal with Codeine Panwarfarin   Buf-Tabs Flurbiprofen Penicillamine   Butalbital Compound Four-way cold tablets Penicillin   Butazolidin Fragmin Pepto-Bismol   Carbenicillin Geminisyn Percodan   Carna Arthritis Reliever Geopen Persantine    Carprofen Gold's salt Persistin   Chloramphenicol Goody's Phenylbutazone   Chloromycetin Haltrain Piroxlcam   Clmetidine heparin Plaquenil   Cllnoril Hyco-pap Ponstel   Clofibrate Hydroxy chloroquine Propoxyphen         Before stopping any of these medications, be sure to consult the physician who ordered them.  Some, such as Coumadin (Warfarin) are ordered to prevent or treat serious conditions such as "deep thrombosis", "pumonary embolisms", and other heart problems.  The amount of time that you may need off of the medication may also vary with the medication and the reason for which you were taking it.  If you are taking any of these medications, please make sure you notify your pain physician before you undergo any procedures.

## 2015-10-08 NOTE — Progress Notes (Signed)
premenstrual tension syndrome  

## 2015-10-09 ENCOUNTER — Telehealth: Payer: Self-pay | Admitting: *Deleted

## 2015-10-09 NOTE — Telephone Encounter (Signed)
Spoke with patient re; procedure on yesterday.  Verbalizes no concerns.

## 2015-10-18 ENCOUNTER — Other Ambulatory Visit: Payer: Self-pay | Admitting: Internal Medicine

## 2015-10-22 ENCOUNTER — Ambulatory Visit: Payer: BC Managed Care – PPO | Admitting: Psychology

## 2015-10-27 ENCOUNTER — Ambulatory Visit: Payer: BC Managed Care – PPO | Attending: Pain Medicine | Admitting: Pain Medicine

## 2015-10-27 ENCOUNTER — Encounter: Payer: Self-pay | Admitting: Pain Medicine

## 2015-10-27 VITALS — BP 130/82 | HR 104 | Temp 98.9°F | Resp 18 | Ht 68.0 in | Wt 168.0 lb

## 2015-10-27 DIAGNOSIS — M533 Sacrococcygeal disorders, not elsewhere classified: Secondary | ICD-10-CM | POA: Diagnosis not present

## 2015-10-27 DIAGNOSIS — M62838 Other muscle spasm: Secondary | ICD-10-CM | POA: Insufficient documentation

## 2015-10-27 DIAGNOSIS — Z8781 Personal history of (healed) traumatic fracture: Secondary | ICD-10-CM | POA: Insufficient documentation

## 2015-10-27 DIAGNOSIS — M2578 Osteophyte, vertebrae: Secondary | ICD-10-CM | POA: Diagnosis not present

## 2015-10-27 DIAGNOSIS — M51369 Other intervertebral disc degeneration, lumbar region without mention of lumbar back pain or lower extremity pain: Secondary | ICD-10-CM

## 2015-10-27 DIAGNOSIS — M5030 Other cervical disc degeneration, unspecified cervical region: Secondary | ICD-10-CM | POA: Insufficient documentation

## 2015-10-27 DIAGNOSIS — S2242XA Multiple fractures of ribs, left side, initial encounter for closed fracture: Secondary | ICD-10-CM

## 2015-10-27 DIAGNOSIS — M79601 Pain in right arm: Secondary | ICD-10-CM | POA: Diagnosis present

## 2015-10-27 DIAGNOSIS — Q761 Klippel-Feil syndrome: Secondary | ICD-10-CM

## 2015-10-27 DIAGNOSIS — G588 Other specified mononeuropathies: Secondary | ICD-10-CM | POA: Diagnosis not present

## 2015-10-27 DIAGNOSIS — M79602 Pain in left arm: Secondary | ICD-10-CM | POA: Diagnosis present

## 2015-10-27 DIAGNOSIS — M542 Cervicalgia: Secondary | ICD-10-CM | POA: Diagnosis present

## 2015-10-27 DIAGNOSIS — M5416 Radiculopathy, lumbar region: Secondary | ICD-10-CM

## 2015-10-27 DIAGNOSIS — M47816 Spondylosis without myelopathy or radiculopathy, lumbar region: Secondary | ICD-10-CM

## 2015-10-27 DIAGNOSIS — M5116 Intervertebral disc disorders with radiculopathy, lumbar region: Secondary | ICD-10-CM | POA: Diagnosis not present

## 2015-10-27 DIAGNOSIS — M5136 Other intervertebral disc degeneration, lumbar region: Secondary | ICD-10-CM

## 2015-10-27 MED ORDER — OXYCODONE-ACETAMINOPHEN 5-325 MG PO TABS: ORAL_TABLET | ORAL | Status: DC

## 2015-10-27 NOTE — Progress Notes (Signed)
Safety precautions to be maintained throughout the outpatient stay will include: orient to surroundings, keep bed in low position, maintain call bell within reach at all times, provide assistance with transfer out of bed and ambulation.  

## 2015-10-27 NOTE — Progress Notes (Signed)
Subjective:    Patient ID: NAM REISH, male    DOB: 06-02-1951, 65 y.o.   MRN: [id number]  HPI  The patient is a 65 year old gentleman who returns to pain management for further evaluation and treatment of pain involving the neck upper extremity regions as well as the lower back and lower extremity region. The patient states the predominant pain occurs in the lower back region radiating across the buttocks to the lower extremities on the left as well as on the right. The patient states the pain is aggravated by prolonged standing and walking. The patient states that he would like to proceed with radiofrequency rhizolysis lumbar facet, medial branch nerves in attempt to decrease severity of symptoms, minimize progression of symptoms, and avoid the need for more involved treatment. We we will proceed with radiofrequency rhizolysis lumbar facets provided we obtain insurance approval. Denied any trauma change in events of daily living the call significant change in symptomatology. The patient states the pain interferes with activities of daily living also interferes patient ability to obtain restful sleep. We will continue medication oxycodone and will consider patient for radiofrequency rhizolysis lumbar facet medial branch nerves L5 L4 and L3 pending insurance approval. All agreed to suggested treatment plan.           Review of Systems     Objective:   Physical Exam   There was tenderness to palpation of the paraspinal muscular region cervical region cervical facet region palpation which reproduces pain of mild-to-moderate degree. There was well-healed surgical scar of the cervical region without increased warmth and erythema in the region of the scar. The patient was with tenderness of the acromioclavicular and glenohumeral joint region of mild to moderate degree and patient appeared to be with slightly decreased grip strength with Tinel and Phalen's maneuver reproducing minimal  discomfort. Palpation over the thoracic facet thoracic paraspinal must reason reveal patient to be with moderate muscle spasms of the lower thoracic region with no crepitus of the thoracic region noted. Lumbar facet lumbar paraspinal must reason reproduced moderately severe disc comfort. Lateral bending rotation extension and palpation of the lumbar facets reproduce moderate severe discomfort. There was mild to moderate tenderness of the PSIS and PII S region palpation of the greater trochanteric region iliotibial band region was with mild discomfort. There was well-healed scar of the knee without increased warmth and erythema in the region scar no increased warmth erythema in the region of the knees noted. Tinel strength appeared to be slightly decreased. There was negative clonus negative Homans. No definite sensory deficit or dermatomal distribution detected. Abdomen was nontender with no costovertebral tenderness noted.       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 Lateral recess narrowing at L3-4 through L4-5  Lumbar facet syndrome  Lumbar radiculopathy  Sacroiliac joint dysfunction      PLAN   Continue present medications oxycodone acetaminophen  F/U PCP Dr Ronette Deter  for evaliation of  BP and general medical  condition as previously discussed  F/U surgical evaluation. May consider pending follow-up evaluations  F/U neurological evaluation. May consider pending follow-up evaluations  Ask Angie and secretaries if you have been approved for radiofrequency rhizolysis lumbar facets medial branch nerves  Psych evaluation as discussed. Follow-up with psych evaluation as scheduled  with Dr.Perrin   May consider  radiofrequency rhizolysis or intraspinal procedures pending response to present treatment and F/U evaluation   Patient to call [facility] Management Center should patient have concerns prior to scheduled return appointment  Sacroiliac joint dysfunction

## 2015-10-27 NOTE — Patient Instructions (Signed)
  PLAN   Continue present medications oxycodone acetaminophen  F/U PCP Dr Ronette Deter  for evaliation of  BP and general medical  condition as previously discussed  F/U surgical evaluation. May consider pending follow-up evaluations  F/U neurological evaluation. May consider pending follow-up evaluations  Ask Angie and secretaries if you have been approved for radiofrequency rhizolysis lumbar facets medial branch nerves  Psych evaluation as discussed. Follow-up with psych evaluation as scheduled with Dr.Perrin   May consider radiofrequency rhizolysis or intraspinal procedures pending response to present treatment and F/U evaluation   Patient to call [facility] Center should patient have concerns prior to scheduled return appointment

## 2015-11-12 ENCOUNTER — Ambulatory Visit (INDEPENDENT_AMBULATORY_CARE_PROVIDER_SITE_OTHER): Payer: BC Managed Care – PPO | Admitting: Internal Medicine

## 2015-11-12 ENCOUNTER — Encounter: Payer: Self-pay | Admitting: Internal Medicine

## 2015-11-12 VITALS — BP 97/62 | HR 104 | Temp 98.4°F | Ht 68.0 in | Wt 185.2 lb

## 2015-11-12 DIAGNOSIS — I10 Essential (primary) hypertension: Secondary | ICD-10-CM | POA: Diagnosis not present

## 2015-11-12 DIAGNOSIS — D6489 Other specified anemias: Secondary | ICD-10-CM

## 2015-11-12 DIAGNOSIS — G894 Chronic pain syndrome: Secondary | ICD-10-CM | POA: Diagnosis not present

## 2015-11-12 DIAGNOSIS — R600 Localized edema: Secondary | ICD-10-CM | POA: Insufficient documentation

## 2015-11-12 MED ORDER — FUROSEMIDE 20 MG PO TABS: 20.0000 mg | ORAL_TABLET | Freq: Every day | ORAL | Status: DC

## 2015-11-12 MED ORDER — CYCLOBENZAPRINE HCL 5 MG PO TABS: ORAL_TABLET | ORAL | Status: DC

## 2015-11-12 NOTE — Progress Notes (Signed)
Pre visit review using our clinic review tool, if applicable. No additional management support is needed unless otherwise documented below in the visit note. 

## 2015-11-12 NOTE — Assessment & Plan Note (Signed)
Discussed limiting Furosemide to every other day given some lightheadedness. Will also check electrolytes today.

## 2015-11-12 NOTE — Assessment & Plan Note (Signed)
BP Readings from Last 3 Encounters:  11/12/15 97/62  10/27/15 130/82  10/08/15 114/77   BP running low. Will hold Hydralazine. He will call with BP readings. If BP continues to be low, then will stop Amlodipine. Check CMP with labs today.

## 2015-11-12 NOTE — Progress Notes (Signed)
Subjective:    Patient ID: Justin Patel, male    DOB: [birth year], 65 y.o.   MRN: [id number]  HPI  65YO male presents for follow up.  Feeling lightheaded with standing over last few weeks. Concerned about low BP. Has not checked BP at home. Compliant with BP meds. No chest pain, dyspnea.   Doing very well otherwise. Pain in chest wall alleviated after recent injections with Dr. Primus Bravo.  No alcohol in 3 months.   Wt Readings from Last 3 Encounters:  11/12/15 185 lb 4 oz (84.029 kg)  10/27/15 168 lb (76.204 kg)  10/08/15 173 lb (78.472 kg)   BP Readings from Last 3 Encounters:  11/12/15 97/62  10/27/15 130/82  10/08/15 114/77    Past Medical History  Diagnosis Date  . Hypertension   . Anxiety   . Sleep apnea     hx not now since wt loss  . Diabetes mellitus   . Stones in the urinary tract   . GERD (gastroesophageal reflux disease)   . H/O hiatal hernia   . Headache(784.0)   . Arthritis   . 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 Gastric ulcer   . Degenerative disc disease   . Sacral fracture, closed ([place])   . Syncope and collapse   . Chronic kidney disease   . Heart murmur   . Hyperlipidemia   . Hx of congestive heart failure   . Iron deficiency anemia 02/28/2015   Family History  Problem Relation Age of Onset  . Dementia Mother 10  . Heart disease Father 49  . Diabetes Father   . Lymphoma Sister     lymphoma, stage 4  . Ovarian cancer Sister 27    Ovarian  . Lupus Sister    Past Surgical History  Procedure Laterality Date  . Hernia repair  2000    hiatal  . Gastric bypass  2010    [place]  . Knee arthroscopy      bilateral, left x 2  . Osteotomy  2001    left  . Tonsillectomy    . Appendectomy    . Nasal sinus surgery      x5  . Uvulopalatoplasty  2011  . Anterior cervical decomp/discectomy fusion  01/26/2012    Procedure: ANTERIOR CERVICAL DECOMPRESSION/DISCECTOMY FUSION 3 LEVELS;  Surgeon: Floyce Stakes, MD;   Location: [facility];  Service: Neurosurgery;  Laterality: N/A;  Cervical three-four Cervical four-five Cervical five-six Cervical six-seven , Anterior cervical decompression/diskectomy, fusion, plate  . Total knee arthroplasty Left 05/2014    Dr. Marry Guan  . Cardiac catheterization      [facility], normal  . Cardiac catheterization      [facility]  . Joint replacement     Social History   Social History  . Marital Status: Married    Spouse Name: N/A  . Number of Children: N/A  . Years of Education: N/A   Social History Main Topics  . Smoking status: Never Smoker   . Smokeless tobacco: Never Used  . Alcohol Use: 0.6 oz/week    1 Glasses of wine per week     Comment: occasional  . Drug Use: No  . Sexual Activity:    Partners: Female   Other Topics Concern  . None   Social History Narrative   Lives in [place] with wife, [place]. 2 sons, William Hamburger, Lennette Bihari 12.. 4 grandchildren      Work -  Retired, previously taught music in [place] - regular diet, limited quantities after gastric bypass      Exercise - no regular, limited by arthritis in knees, occasional water aerobics    Review of Systems  Constitutional: Positive for fatigue. Negative for fever, chills, activity change, appetite change and unexpected weight change.  Eyes: Negative for visual disturbance.  Respiratory: Negative for cough and shortness of breath.   Cardiovascular: Negative for chest pain, palpitations and leg swelling.  Gastrointestinal: Negative for nausea, vomiting, abdominal pain, diarrhea, constipation and abdominal distention.  Genitourinary: Negative for dysuria, urgency and difficulty urinating.  Musculoskeletal: Positive for myalgias and arthralgias. Negative for gait problem.  Skin: Negative for color change and rash.  Neurological: Positive for light-headedness. Negative for dizziness, tremors, syncope, facial asymmetry, weakness, numbness and headaches.    Hematological: Negative for adenopathy.  Psychiatric/Behavioral: Negative for suicidal ideas, sleep disturbance and dysphoric mood. The patient is not nervous/anxious.        Objective:    BP 97/62 mmHg  Pulse 104  Temp(Src) 98.4 F (36.9 C) (Oral)  Ht 5\' 8"  (1.727 m)  Wt 185 lb 4 oz (84.029 kg)  BMI 28.17 kg/m2  SpO2 97% Physical Exam  Constitutional: He is oriented to person, place, and time. He appears well-developed and well-nourished. No distress.  HENT:  Head: Normocephalic and atraumatic.  Right Ear: External ear normal.  Left Ear: External ear normal.  Nose: Nose normal.  Mouth/Throat: Oropharynx is clear and moist. No oropharyngeal exudate.  Eyes: Conjunctivae and EOM are normal. Pupils are equal, round, and reactive to light. Right eye exhibits no discharge. Left eye exhibits no discharge. No scleral icterus.  Neck: Normal range of motion. Neck supple. No tracheal deviation present. No thyromegaly present.  Cardiovascular: Normal rate, regular rhythm and normal heart sounds.  Exam reveals no gallop and no friction rub.   No murmur heard. Pulmonary/Chest: Effort normal and breath sounds normal. No accessory muscle usage. No tachypnea. No respiratory distress. He has no decreased breath sounds. He has no wheezes. He has no rhonchi. He has no rales. He exhibits no tenderness.  Musculoskeletal: Normal range of motion. He exhibits no edema.  Lymphadenopathy:    He has no cervical adenopathy.  Neurological: He is alert and oriented to person, place, and time. No cranial nerve deficit. Coordination normal.  Skin: Skin is warm and dry. No rash noted. He is not diaphoretic. No erythema. No pallor.  Psychiatric: He has a normal mood and affect. His behavior is normal. Judgment and thought content normal.          Assessment & Plan:   Problem List Items Addressed This Visit      Unprioritized   Anemia    Recheck of CBC pending for 2 weeks with hematology. Will follow.       Bilateral edema of lower extremity    Discussed limiting Furosemide to every other day given some lightheadedness. Will also check electrolytes today.      Relevant Medications   furosemide (LASIX) 20 MG tablet   Chronic pain syndrome    Symptoms of chronic pain improved after recent steroid injections. Will continue to monitor.      Essential hypertension, benign - Primary    BP Readings from Last 3 Encounters:  11/12/15 97/62  10/27/15 130/82  10/08/15 114/77   BP running low. Will hold Hydralazine. He will call with BP readings. If BP continues to be low,  then will stop Amlodipine. Check CMP with labs today.      Relevant Medications   furosemide (LASIX) 20 MG tablet   Other Relevant Orders   Comprehensive metabolic panel       Return in about 4 weeks (around 12/10/2015) for Recheck.  Ronette Deter, MD [facility]

## 2015-11-12 NOTE — Assessment & Plan Note (Signed)
Symptoms of chronic pain improved after recent steroid injections. Will continue to monitor.

## 2015-11-12 NOTE — Patient Instructions (Signed)
Stop Hydralazine.  Monitor blood pressure and email or call with readings.  Use Furosemide every other day.  Labs today.

## 2015-11-12 NOTE — Assessment & Plan Note (Signed)
Recheck of CBC pending for 2 weeks with hematology. Will follow.

## 2015-11-13 LAB — COMPREHENSIVE METABOLIC PANEL
ALBUMIN: 4.4 g/dL (ref 3.5–5.2)
ALT: 45 U/L (ref 0–53)
AST: 22 U/L (ref 0–37)
Alkaline Phosphatase: 62 U/L (ref 39–117)
BILIRUBIN TOTAL: 0.3 mg/dL (ref 0.2–1.2)
BUN: 24 mg/dL — AB (ref 6–23)
CALCIUM: 10 mg/dL (ref 8.4–10.5)
CO2: 25 meq/L (ref 19–32)
CREATININE: 1.18 mg/dL (ref 0.40–1.50)
Chloride: 103 mEq/L (ref 96–112)
GFR: 65.9 mL/min (ref >60.00)
Glucose, Bld: 95 mg/dL (ref 70–99)
Potassium: 4.4 mEq/L (ref 3.5–5.1)
Sodium: 136 mEq/L (ref 135–145)
Total Protein: 7.3 g/dL (ref 6.0–8.3)

## 2015-11-25 ENCOUNTER — Encounter: Payer: Self-pay | Admitting: Pain Medicine

## 2015-11-25 ENCOUNTER — Ambulatory Visit: Payer: BC Managed Care – PPO | Attending: Pain Medicine | Admitting: Pain Medicine

## 2015-11-25 VITALS — BP 128/82 | HR 98 | Temp 98.3°F | Resp 16 | Ht 68.0 in | Wt 170.0 lb

## 2015-11-25 DIAGNOSIS — Z8781 Personal history of (healed) traumatic fracture: Secondary | ICD-10-CM | POA: Insufficient documentation

## 2015-11-25 DIAGNOSIS — M62838 Other muscle spasm: Secondary | ICD-10-CM | POA: Insufficient documentation

## 2015-11-25 DIAGNOSIS — M542 Cervicalgia: Secondary | ICD-10-CM | POA: Diagnosis present

## 2015-11-25 DIAGNOSIS — M5030 Other cervical disc degeneration, unspecified cervical region: Secondary | ICD-10-CM | POA: Insufficient documentation

## 2015-11-25 DIAGNOSIS — M2578 Osteophyte, vertebrae: Secondary | ICD-10-CM | POA: Diagnosis not present

## 2015-11-25 DIAGNOSIS — M533 Sacrococcygeal disorders, not elsewhere classified: Secondary | ICD-10-CM

## 2015-11-25 DIAGNOSIS — Q761 Klippel-Feil syndrome: Secondary | ICD-10-CM

## 2015-11-25 DIAGNOSIS — M5136 Other intervertebral disc degeneration, lumbar region: Secondary | ICD-10-CM

## 2015-11-25 DIAGNOSIS — G588 Other specified mononeuropathies: Secondary | ICD-10-CM | POA: Insufficient documentation

## 2015-11-25 DIAGNOSIS — M5116 Intervertebral disc disorders with radiculopathy, lumbar region: Secondary | ICD-10-CM | POA: Insufficient documentation

## 2015-11-25 DIAGNOSIS — M5416 Radiculopathy, lumbar region: Secondary | ICD-10-CM

## 2015-11-25 DIAGNOSIS — S2242XA Multiple fractures of ribs, left side, initial encounter for closed fracture: Secondary | ICD-10-CM

## 2015-11-25 DIAGNOSIS — M51369 Other intervertebral disc degeneration, lumbar region without mention of lumbar back pain or lower extremity pain: Secondary | ICD-10-CM

## 2015-11-25 DIAGNOSIS — M47816 Spondylosis without myelopathy or radiculopathy, lumbar region: Secondary | ICD-10-CM

## 2015-11-25 MED ORDER — OXYCODONE-ACETAMINOPHEN 5-325 MG PO TABS: ORAL_TABLET | ORAL | Status: DC

## 2015-11-25 NOTE — Progress Notes (Signed)
Safety precautions to be maintained throughout the outpatient stay will include: orient to surroundings, keep bed in low position, maintain call bell within reach at all times, provide assistance with transfer out of bed and ambulation.  

## 2015-11-25 NOTE — Patient Instructions (Addendum)
PLAN   Continue present medications oxycodone acetaminophen  Radiofrequency lumbar facet, medial branch nerves to be performed at time of return appointment  F/U PCP Dr Ronette Deter  for evaliation of  BP and general medical  condition as previously discussed  F/U surgical evaluation. May consider pending follow-up evaluations  F/U neurological evaluation. May consider pending follow-up evaluations  Ask Angie and secretaries if you have been approved for radiofrequency rhizolysis lumbar facets medial branch nerves  Psych evaluation as discussed. Follow-up with psych evaluation as scheduled with Dr.Perrin   May consider radiofrequency rhizolysis or intraspinal procedures pending response to present treatment and F/U evaluation   Patient to call [facility] should patient have concerns prior to scheduled return appointment  Radiofrequency Lesioning Radiofrequency lesioning is a procedure that is performed to relieve pain. The procedure is often used for back, neck, or arm pain. Radiofrequency lesioning involves the use of a machine that creates radio waves to make heat. During the procedure, the heat is applied to the nerve that carries the pain signal. The heat damages the nerve and interferes with the pain signal. Pain relief usually lasts for 6 months to 1 year. LET [facility] CARE PROVIDER KNOW ABOUT:  Any allergies you have.  All medicines you are taking, including vitamins, herbs, eye drops, creams, and over-the-counter medicines.  Previous problems you or members of your family have had with the use of anesthetics.  Any blood disorders you have.  Previous surgeries you have had.  Any medical conditions you have.  Whether you are pregnant or may be pregnant. RISKS AND COMPLICATIONS Generally, this is a safe procedure. However, problems may occur, including:  Pain or soreness at the injection site.  Infection at the injection site.  Damage to nerves or  blood vessels. BEFORE THE PROCEDURE  Ask your health care provider about:  Changing or stopping your regular medicines. This is especially important if you are taking diabetes medicines or blood thinners.  Taking medicines such as aspirin and ibuprofen. These medicines can thin your blood. Do not take these medicines before your procedure if your health care provider instructs you not to.  Follow instructions from your health care provider about eating or drinking restrictions.  Plan to have someone take you home after the procedure.  If you go home right after the procedure, plan to have someone with you for 24 hours. PROCEDURE  You will be given one or more of the following:  A medicine to help you relax (sedative).  A medicine to numb the area (local anesthetic).  You will be awake during the procedure. You will need to be able to talk with the health care provider during the procedure.  With the help of a type of X-ray (fluoroscopy), the health care provider will insert a radiofrequency needle into the area to be treated.  Next, a wire that carries the radio waves (electrode) will be put through the radiofrequency needle. An electrical pulse will be sent through the electrode to verify the correct nerve. You will feel a tingling sensation, and you may have muscle twitching.  Then, the tissue that is around the needle tip will be heated by an electric current that is passed using the radiofrequency machine. This will numb the nerves.  A bandage (dressing) will be put on the insertion area after the procedure is done. The procedure may vary among health care providers and hospitals. AFTER THE PROCEDURE  Your blood pressure, heart rate, breathing rate, and blood  oxygen level will be monitored often until the medicines you were given have worn off.  Return to your normal activities as directed by your health care provider.   This information is not intended to replace advice  given to you by your health care provider. Make sure you discuss any questions you have with your health care provider.   Document Released: 03/24/2011 Document Revised: 04/16/2015 Document Reviewed: 09/02/2014 Elsevier Interactive Patient Education 2016 [employer].  A prescription for OXYCODONE was given to you today.

## 2015-11-25 NOTE — Progress Notes (Signed)
   Subjective:    Patient ID: Justin Patel, male    DOB: 08-18-1950, 65 y.o.   MRN: [id number]  HPI  The patient is a 65 year old gentleman who returns to pain management for further evaluation and treatment of pain involving the neck entire back upper and lower extremity regions. The patient states that the most bothersome pain involves the lower back and lower extremity region. The patient continues medications consisting of oxycodone. We discussed patient's condition and patient is to undergo radiofrequency rhizolysis lumbar facet, medial branch nerves and appointment. We will continue oxycodone at this time and will proceed with radial freaks rhizolysis lumbar facet, medial branch nerves on the left side at time of return appointment. All agreed with suggested treatment plan  Review of Systems     Objective:   Physical Exam  There was tenderness of the splenius capitis and occipitalis musculature region of moderate degree with moderate tenderness over the cervical facet cervical paraspinal musculature region. Palpation of the acromioclavicular and glenohumeral joint region was with moderate tenderness to palpation as well and patient appeared to be with slightly decreased grip strength. Tinel and Phalen's maneuver were without increased pain of significant degree. Palpation over the thoracic facet thoracic paraspinal musculature region was attends to palpation of moderate degree with moderate muscle spasms of the lower thoracic paraspinal muscles region. Palpation over the lumbar paraspinal musculature region lumbar facet region was with moderate to moderately severe tenderness to palpation with lateral bending rotation extension and palpation over the lumbar facets reproducing moderately severe discomfort. There was moderate tenderness of the PSIS and PII S region as well as the gluteal and piriformis musculature region with mild tenderness of the greater trochanteric region iliotibial band  region. Straight leg raising was tolerated to possibly 20 without a definite increased pain with dorsiflexion noted. There was negative clonus negative Homans. No sensory deficit or dermatomal dystrophy detected. Abdomen nontender with no costovertebral tenderness noted The predominant portion of patient's pain was reproduced with palpation over the lumbar facet lumbar paraspinal musculature region.      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 Lateral recess narrowing at L3-4 through L4-5  Lumbar facet syndrome  Lumbar radiculopathy  Sacroiliac joint dysfunction      PLAN   Continue present medications oxycodone acetaminophen  Radiofrequency lumbar facet, medial branch nerves to be performed at time of return appointment  F/U PCP Dr Ronette Deter  for evaliation of  BP and general medical  condition as previously discussed  F/U surgical evaluation. May consider pending follow-up evaluations  F/U neurological evaluation. May consider pending follow-up evaluations  Ask Angie and secretaries if you have been approved for radiofrequency rhizolysis lumbar facets medial branch nerves  Psych evaluation as discussed. Follow-up with psych evaluation as scheduled with Dr.Perrin   May consider radiofrequency rhizolysis or intraspinal procedures pending response to present treatment and F/U evaluation   Patient to call [facility] should patient have concerns prior to scheduled return appointment

## 2015-11-26 ENCOUNTER — Inpatient Hospital Stay: Payer: BC Managed Care – PPO | Attending: Hematology and Oncology

## 2015-11-26 DIAGNOSIS — I129 Hypertensive chronic kidney disease with stage 1 through stage 4 chronic kidney disease, or unspecified chronic kidney disease: Secondary | ICD-10-CM | POA: Insufficient documentation

## 2015-11-26 DIAGNOSIS — R600 Localized edema: Secondary | ICD-10-CM | POA: Diagnosis not present

## 2015-11-26 DIAGNOSIS — I959 Hypotension, unspecified: Secondary | ICD-10-CM | POA: Insufficient documentation

## 2015-11-26 DIAGNOSIS — M1990 Unspecified osteoarthritis, unspecified site: Secondary | ICD-10-CM | POA: Diagnosis not present

## 2015-11-26 DIAGNOSIS — E1122 Type 2 diabetes mellitus with diabetic chronic kidney disease: Secondary | ICD-10-CM | POA: Insufficient documentation

## 2015-11-26 DIAGNOSIS — D508 Other iron deficiency anemias: Secondary | ICD-10-CM | POA: Diagnosis not present

## 2015-11-26 DIAGNOSIS — N189 Chronic kidney disease, unspecified: Secondary | ICD-10-CM | POA: Insufficient documentation

## 2015-11-26 DIAGNOSIS — R110 Nausea: Secondary | ICD-10-CM | POA: Diagnosis not present

## 2015-11-26 DIAGNOSIS — Z9884 Bariatric surgery status: Secondary | ICD-10-CM | POA: Insufficient documentation

## 2015-11-26 DIAGNOSIS — K9089 Other intestinal malabsorption: Secondary | ICD-10-CM | POA: Diagnosis not present

## 2015-11-26 DIAGNOSIS — Z807 Family history of other malignant neoplasms of lymphoid, hematopoietic and related tissues: Secondary | ICD-10-CM | POA: Insufficient documentation

## 2015-11-26 DIAGNOSIS — Z8041 Family history of malignant neoplasm of ovary: Secondary | ICD-10-CM | POA: Diagnosis not present

## 2015-11-26 DIAGNOSIS — K219 Gastro-esophageal reflux disease without esophagitis: Secondary | ICD-10-CM | POA: Diagnosis not present

## 2015-11-26 DIAGNOSIS — Z79899 Other long term (current) drug therapy: Secondary | ICD-10-CM | POA: Insufficient documentation

## 2015-11-26 DIAGNOSIS — R5381 Other malaise: Secondary | ICD-10-CM | POA: Insufficient documentation

## 2015-11-26 DIAGNOSIS — D509 Iron deficiency anemia, unspecified: Secondary | ICD-10-CM

## 2015-11-26 DIAGNOSIS — R42 Dizziness and giddiness: Secondary | ICD-10-CM | POA: Diagnosis not present

## 2015-11-26 LAB — BASIC METABOLIC PANEL
ANION GAP: 6 (ref 5–15)
BUN: 19 mg/dL (ref 6–20)
CALCIUM: 9.7 mg/dL (ref 8.9–10.3)
CO2: 31 mmol/L (ref 22–32)
Chloride: 100 mmol/L — ABNORMAL LOW (ref 101–111)
Creatinine, Ser: 1.01 mg/dL (ref 0.61–1.24)
Glucose, Bld: 101 mg/dL — ABNORMAL HIGH (ref 65–99)
Potassium: 5.3 mmol/L — ABNORMAL HIGH (ref 3.5–5.1)
SODIUM: 137 mmol/L (ref 135–145)

## 2015-11-26 LAB — CBC WITH DIFFERENTIAL/PLATELET
BASOS ABS: 0 10*3/uL (ref 0–0.1)
BASOS PCT: 1 %
EOS PCT: 4 %
Eosinophils Absolute: 0.2 10*3/uL (ref 0–0.7)
HEMATOCRIT: 38.3 % — AB (ref 40.0–52.0)
Hemoglobin: 12.9 g/dL — ABNORMAL LOW (ref 13.0–18.0)
Lymphocytes Relative: 32 %
Lymphs Abs: 1.9 10*3/uL (ref 1.0–3.6)
MCH: 32.7 pg (ref 26.0–34.0)
MCHC: 33.6 g/dL (ref 32.0–36.0)
MCV: 97.3 fL (ref 80.0–100.0)
MONO ABS: 0.5 10*3/uL (ref 0.2–1.0)
Monocytes Relative: 9 %
NEUTROS ABS: 3.2 10*3/uL (ref 1.4–6.5)
Neutrophils Relative %: 54 %
PLATELETS: 174 10*3/uL (ref 150–440)
RBC: 3.93 MIL/uL — ABNORMAL LOW (ref 4.40–5.90)
RDW: 14.5 % (ref 11.5–14.5)
WBC: 5.8 10*3/uL (ref 3.8–10.6)

## 2015-11-26 LAB — FERRITIN: FERRITIN: 327 ng/mL (ref 24–336)

## 2015-11-27 ENCOUNTER — Inpatient Hospital Stay: Payer: BC Managed Care – PPO

## 2015-11-27 ENCOUNTER — Encounter: Payer: Self-pay | Admitting: Emergency Medicine

## 2015-11-27 ENCOUNTER — Other Ambulatory Visit: Payer: Self-pay

## 2015-11-27 ENCOUNTER — Emergency Department
Admission: EM | Admit: 2015-11-27 | Discharge: 2015-11-27 | Disposition: A | Discharge status: Home / Self Care | Payer: BC Managed Care – PPO | Attending: Emergency Medicine | Admitting: Emergency Medicine

## 2015-11-27 ENCOUNTER — Inpatient Hospital Stay (HOSPITAL_BASED_OUTPATIENT_CLINIC_OR_DEPARTMENT_OTHER): Payer: BC Managed Care – PPO | Admitting: Internal Medicine

## 2015-11-27 ENCOUNTER — Encounter: Payer: Self-pay | Admitting: Internal Medicine

## 2015-11-27 ENCOUNTER — Emergency Department: Payer: BC Managed Care – PPO

## 2015-11-27 VITALS — BP 80/50 | HR 62 | Temp 98.4°F | Resp 18 | Ht 68.0 in | Wt 189.2 lb

## 2015-11-27 DIAGNOSIS — Z91041 Radiographic dye allergy status: Secondary | ICD-10-CM | POA: Insufficient documentation

## 2015-11-27 DIAGNOSIS — F1010 Alcohol abuse, uncomplicated: Secondary | ICD-10-CM | POA: Diagnosis not present

## 2015-11-27 DIAGNOSIS — R42 Dizziness and giddiness: Secondary | ICD-10-CM

## 2015-11-27 DIAGNOSIS — E1122 Type 2 diabetes mellitus with diabetic chronic kidney disease: Secondary | ICD-10-CM | POA: Insufficient documentation

## 2015-11-27 DIAGNOSIS — Z9884 Bariatric surgery status: Secondary | ICD-10-CM

## 2015-11-27 DIAGNOSIS — Z79899 Other long term (current) drug therapy: Secondary | ICD-10-CM | POA: Diagnosis not present

## 2015-11-27 DIAGNOSIS — R110 Nausea: Secondary | ICD-10-CM

## 2015-11-27 DIAGNOSIS — Z888 Allergy status to other drugs, medicaments and biological substances status: Secondary | ICD-10-CM | POA: Insufficient documentation

## 2015-11-27 DIAGNOSIS — E669 Obesity, unspecified: Secondary | ICD-10-CM | POA: Diagnosis not present

## 2015-11-27 DIAGNOSIS — Z8679 Personal history of other diseases of the circulatory system: Secondary | ICD-10-CM | POA: Diagnosis not present

## 2015-11-27 DIAGNOSIS — R5381 Other malaise: Secondary | ICD-10-CM

## 2015-11-27 DIAGNOSIS — I959 Hypotension, unspecified: Secondary | ICD-10-CM | POA: Diagnosis not present

## 2015-11-27 DIAGNOSIS — R1032 Left lower quadrant pain: Secondary | ICD-10-CM | POA: Insufficient documentation

## 2015-11-27 DIAGNOSIS — E785 Hyperlipidemia, unspecified: Secondary | ICD-10-CM | POA: Diagnosis not present

## 2015-11-27 DIAGNOSIS — M1990 Unspecified osteoarthritis, unspecified site: Secondary | ICD-10-CM | POA: Diagnosis not present

## 2015-11-27 DIAGNOSIS — F1110 Opioid abuse, uncomplicated: Secondary | ICD-10-CM | POA: Diagnosis not present

## 2015-11-27 DIAGNOSIS — K9089 Other intestinal malabsorption: Secondary | ICD-10-CM

## 2015-11-27 DIAGNOSIS — I509 Heart failure, unspecified: Secondary | ICD-10-CM | POA: Diagnosis not present

## 2015-11-27 DIAGNOSIS — N189 Chronic kidney disease, unspecified: Secondary | ICD-10-CM | POA: Insufficient documentation

## 2015-11-27 DIAGNOSIS — R109 Unspecified abdominal pain: Secondary | ICD-10-CM

## 2015-11-27 DIAGNOSIS — I129 Hypertensive chronic kidney disease with stage 1 through stage 4 chronic kidney disease, or unspecified chronic kidney disease: Secondary | ICD-10-CM

## 2015-11-27 DIAGNOSIS — K5712 Diverticulitis of small intestine without perforation or abscess without bleeding: Secondary | ICD-10-CM

## 2015-11-27 DIAGNOSIS — Z96659 Presence of unspecified artificial knee joint: Secondary | ICD-10-CM | POA: Diagnosis not present

## 2015-11-27 DIAGNOSIS — R600 Localized edema: Secondary | ICD-10-CM

## 2015-11-27 DIAGNOSIS — D508 Other iron deficiency anemias: Secondary | ICD-10-CM | POA: Diagnosis not present

## 2015-11-27 HISTORY — DX: Heart failure, unspecified: I50.9

## 2015-11-27 LAB — CBC
HEMATOCRIT: 38.2 % — AB (ref 40.0–52.0)
Hemoglobin: 12.8 g/dL — ABNORMAL LOW (ref 13.0–18.0)
MCH: 32.6 pg (ref 26.0–34.0)
MCHC: 33.5 g/dL (ref 32.0–36.0)
MCV: 97.1 fL (ref 80.0–100.0)
PLATELETS: 168 10*3/uL (ref 150–440)
RBC: 3.93 MIL/uL — ABNORMAL LOW (ref 4.40–5.90)
RDW: 14.5 % (ref 11.5–14.5)
WBC: 7.6 10*3/uL (ref 3.8–10.6)

## 2015-11-27 LAB — COMPREHENSIVE METABOLIC PANEL
ALT: 39 U/L (ref 17–63)
AST: 28 U/L (ref 15–41)
Albumin: 3.7 g/dL (ref 3.5–5.0)
Alkaline Phosphatase: 58 U/L (ref 38–126)
Anion gap: 7 (ref 5–15)
BILIRUBIN TOTAL: 0.4 mg/dL (ref 0.3–1.2)
BUN: 19 mg/dL (ref 6–20)
CHLORIDE: 103 mmol/L (ref 101–111)
CO2: 26 mmol/L (ref 22–32)
Calcium: 9.6 mg/dL (ref 8.9–10.3)
Creatinine, Ser: 1.13 mg/dL (ref 0.61–1.24)
Glucose, Bld: 129 mg/dL — ABNORMAL HIGH (ref 65–99)
POTASSIUM: 4.8 mmol/L (ref 3.5–5.1)
Sodium: 136 mmol/L (ref 135–145)
TOTAL PROTEIN: 6.2 g/dL — AB (ref 6.5–8.1)

## 2015-11-27 LAB — TYPE AND SCREEN
ABO/RH(D): AB POS
Antibody Screen: NEGATIVE

## 2015-11-27 LAB — URINALYSIS COMPLETE WITH MICROSCOPIC (ARMC ONLY)
BACTERIA UA: NONE SEEN
BILIRUBIN URINE: NEGATIVE
Glucose, UA: NEGATIVE mg/dL
HGB URINE DIPSTICK: NEGATIVE
Leukocytes, UA: NEGATIVE
NITRITE: NEGATIVE
PH: 5 (ref 5.0–8.0)
PROTEIN: NEGATIVE mg/dL
RBC / HPF: NONE SEEN RBC/hpf (ref 0–5)
Specific Gravity, Urine: 1.018 (ref 1.005–1.030)
Squamous Epithelial / LPF: NONE SEEN

## 2015-11-27 LAB — TROPONIN I: Troponin I: <0.03 ng/mL (ref <0.031)

## 2015-11-27 LAB — LACTIC ACID, PLASMA: Lactic Acid, Venous: 1.2 mmol/L (ref 0.5–2.0)

## 2015-11-27 LAB — LIPASE, BLOOD: LIPASE: 35 U/L (ref 11–51)

## 2015-11-27 MED ORDER — DIATRIZOATE MEGLUMINE & SODIUM 66-10 % PO SOLN
15.0000 mL | Freq: Once | ORAL | Status: AC
  Administered 2015-11-27: 15 mL via ORAL

## 2015-11-27 MED ORDER — SODIUM CHLORIDE 0.9 % IV BOLUS (SEPSIS): 1000.0000 mL | Freq: Once | INTRAVENOUS | Status: DC

## 2015-11-27 MED ORDER — HYDROCORTISONE NA SUCCINATE PF 100 MG IJ SOLR
200.0000 mg | Freq: Once | INTRAMUSCULAR | Status: AC
  Administered 2015-11-27: 200 mg via INTRAVENOUS
  Filled 2015-11-27: qty 4

## 2015-11-27 MED ORDER — PIPERACILLIN-TAZOBACTAM 3.375 G IVPB 30 MIN
3.3750 g | Freq: Once | INTRAVENOUS | Status: AC
  Administered 2015-11-27: 3.375 g via INTRAVENOUS
  Filled 2015-11-27: qty 50

## 2015-11-27 MED ORDER — SODIUM CHLORIDE 0.9 % IV BOLUS (SEPSIS)
1000.0000 mL | Freq: Once | INTRAVENOUS | Status: AC
  Administered 2015-11-27: 1000 mL via INTRAVENOUS

## 2015-11-27 MED ORDER — IOPAMIDOL (ISOVUE-300) INJECTION 61%
100.0000 mL | Freq: Once | INTRAVENOUS | Status: AC | PRN
  Administered 2015-11-27: 100 mL via INTRAVENOUS

## 2015-11-27 MED ORDER — DIPHENHYDRAMINE HCL 50 MG/ML IJ SOLN
50.0000 mg | Freq: Once | INTRAMUSCULAR | Status: AC
  Administered 2015-11-27: 50 mg via INTRAVENOUS
  Filled 2015-11-27: qty 1

## 2015-11-27 MED ORDER — AMOXICILLIN-POT CLAVULANATE 200-28.5 MG PO CHEW: 1.0000 | CHEWABLE_TABLET | Freq: Two times a day (BID) | ORAL | Status: DC

## 2015-11-27 MED ORDER — AMOXICILLIN-POT CLAVULANATE 875-125 MG PO TABS: 1.0000 | ORAL_TABLET | Freq: Two times a day (BID) | ORAL | Status: DC

## 2015-11-27 NOTE — ED Notes (Signed)
Patient transported to CT 

## 2015-11-27 NOTE — Progress Notes (Signed)
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 "Patient complains of nauseousness but no vomiting. He gagged Yesterday and spit up some sputum. No blood. He complains of being dizzy. But no syncopal episodes. Denies any fevers. Denies any chest pain. Denies any shortness of breath.  He denies any blood in stools or black stools. Patient states that he was taken off his amlodipine hydralazine; Lasix by his PCP recently.    REVIEW OF SYSTEMS:  A complete 10 point review of system is done which is negative except mentioned above/history of present illness.   PAST MEDICAL HISTORY :  Past Medical History  Diagnosis Date  . Hypertension   . Anxiety   . Sleep apnea     hx not now since wt loss  . Stones in the urinary tract   . GERD (gastroesophageal reflux disease)   . H/O hiatal hernia   . Headache(784.0)   . Arthritis   . 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 Gastric ulcer   . Degenerative disc disease   . Sacral fracture, closed ([place])   . Syncope and collapse   . Chronic kidney disease   . Heart murmur   . Hyperlipidemia   . Hx of congestive heart failure   . Iron deficiency anemia 02/28/2015  . Diabetes mellitus     no longer diabetic-or on meds    PAST SURGICAL HISTORY :   Past Surgical History  Procedure Laterality Date  . Hernia repair  2000    hiatal  . Gastric bypass  2010    [place]  . Knee arthroscopy      bilateral, left x 2  . Osteotomy  2001    left  . Tonsillectomy    . Appendectomy    . Nasal sinus surgery      x5  .  Uvulopalatoplasty  2011  . Anterior cervical decomp/discectomy fusion  01/26/2012    Procedure: ANTERIOR CERVICAL DECOMPRESSION/DISCECTOMY FUSION 3 LEVELS;  Surgeon: Floyce Stakes, MD;  Location: [facility];  Service: Neurosurgery;  Laterality: N/A;  Cervical three-four Cervical four-five Cervical five-six Cervical six-seven , Anterior cervical decompression/diskectomy, fusion, plate  . Total knee arthroplasty Left 05/2014    Dr. Marry Guan  . Cardiac catheterization      [facility], normal  . Cardiac catheterization      [place]  . Joint replacement      FAMILY HISTORY :   Family History  Problem Relation Age of Onset  . Dementia Mother 31  . Heart disease Father 43  . Diabetes Father   . Lymphoma Sister     lymphoma, stage 4  . Ovarian cancer Sister 30    Ovarian  . Lupus Sister     SOCIAL HISTORY:   Social History  Substance Use Topics  . Smoking status: Never Smoker   . Smokeless tobacco: Never Used  . Alcohol Use: No     Comment: stopped 80months ago    ALLERGIES:  is allergic to altace; levaquin; lyrica; metformin; and iohexol.  MEDICATIONS:  Current Outpatient Prescriptions  Medication Sig Dispense Refill  . acetaminophen (TYLENOL) 500 MG tablet Take 1,000 mg by mouth every 6 (six) hours as needed.    . cyclobenzaprine (FLEXERIL) 5 MG tablet take 1 tablet by mouth three times a day if needed for muscle spasm 30 tablet 5  . DULoxetine (CYMBALTA) 60 MG capsule take 1 capsule by mouth once daily 30 capsule 5  . levETIRAcetam (KEPPRA) 500 MG tablet take 1 tablet by mouth twice a day 60 tablet 9  . losartan (COZAAR) 100 MG tablet take 1 tablet by mouth once daily 30 tablet 5  . metoprolol succinate (TOPROL-XL) 50 MG 24 hr tablet take 1 tablet by mouth once daily 30 tablet 6  . NEXIUM 40 MG capsule take 1 capsule by mouth once daily 30 capsule 10  . oxyCODONE-acetaminophen (ROXICET) 5-325 MG tablet Limit 1/2  -  1 tab by mouth per day or twice per day if  tolerated 40 tablet 0  . rosuvastatin (CRESTOR) 10 MG tablet Take 1 tablet (10 mg total) by mouth daily. 30 tablet 5  . sucralfate (CARAFATE) 1 G tablet take 1 tablet by mouth four times a day 120 tablet 11  . tamsulosin (FLOMAX) 0.4 MG CAPS capsule Take 0.4 mg by mouth daily.    Marland Kitchen VITAMIN D, CHOLECALCIFEROL, PO Take 1,000 mg by mouth daily.     Marland Kitchen amLODipine (NORVASC) 5 MG tablet take 1 tablet by mouth daily (Patient not taking: Reported on 11/25/2015) 90 tablet 3  . furosemide (LASIX) 20 MG tablet Take 1 tablet (20 mg total) by mouth daily. (Patient not taking: Reported on 11/27/2015) 30 tablet 3   No current facility-administered medications for this visit.    PHYSICAL EXAMINATION: ECOG PERFORMANCE STATUS: 1 - Symptomatic but completely ambulatory  BP 103/70 mmHg  Pulse 92  Temp(Src) 98.4 F (36.9 C) (Tympanic)  Resp 18  Ht 5\' 8"  (1.727 m)  Wt 189 lb 2.5 oz (85.8 kg)  BMI 28.77 kg/m2  SpO2 95%  Filed Weights   11/27/15 1334  Weight: 189 lb 2.5 oz (85.8 kg)    GENERAL: Well-nourished well-developed; Clammy skin appears fatigued ; sweaty;   Alone; feels dizzy when standing. EYES: no pallor or icterus OROPHARYNX: no thrush or ulceration; good dentition  NECK: supple, no masses felt LYMPH:  no palpable lymphadenopathy in the cervical, axillary or inguinal regions LUNGS: clear to auscultation and  No wheeze or crackles HEART/CVS: regular rate & rhythm and no murmurs; 1 plus edema lower extremity edema ABDOMEN:abdomen soft, non-tender and normal bowel sounds Musculoskeletal:no cyanosis of digits and no clubbing  PSYCH: alert & oriented x 3 with fluent speech NEURO: no focal motor/sensory deficits SKIN:  no rashes or significant lesions  LABORATORY DATA:  I have reviewed the data as listed    Component Value Date/Time   NA 137 11/26/2015 1348   NA 140 05/17/2014 0529   K 5.3* 11/26/2015 1348   K 3.8 05/17/2014 0529   CL 100* 11/26/2015 1348   CL 105 05/17/2014 0529   CO2  31 11/26/2015 1348   CO2 27 05/17/2014 0529   GLUCOSE 101* 11/26/2015 1348   GLUCOSE 112* 05/17/2014 0529   BUN 19 11/26/2015 1348   BUN 10 05/17/2014 0529   CREATININE 1.01 11/26/2015 1348   CREATININE 0.92 07/19/2014 1557   CREATININE 0.95 05/17/2014 0529   CALCIUM 9.7 11/26/2015 1348   CALCIUM 8.2* 05/17/2014 0529   PROT 7.3 11/12/2015 1619   PROT 4.5* 07/22/2013  0410   ALBUMIN 4.4 11/12/2015 1619   ALBUMIN 2.3* 07/22/2013 0410   AST 22 11/12/2015 1619   AST 16 07/22/2013 0410   ALT 45 11/12/2015 1619   ALT 12 07/22/2013 0410   ALKPHOS 62 11/12/2015 1619   ALKPHOS 44* 07/22/2013 0410   BILITOT 0.3 11/12/2015 1619   BILITOT 0.4 07/22/2013 0410   GFRNONAA >60 11/26/2015 1348   GFRNONAA >60 05/17/2014 0529   GFRNONAA >60 05/01/2014 0928   GFRAA >60 11/26/2015 1348   GFRAA >60 05/17/2014 0529   GFRAA >60 05/01/2014 0928    No results found for: SPEP, UPEP  Lab Results  Component Value Date   WBC 5.8 11/26/2015   NEUTROABS 3.2 11/26/2015   HGB 12.9* 11/26/2015   HCT 38.3* 11/26/2015   MCV 97.3 11/26/2015   PLT 174 11/26/2015      Chemistry      Component Value Date/Time   NA 137 11/26/2015 1348   NA 140 05/17/2014 0529   K 5.3* 11/26/2015 1348   K 3.8 05/17/2014 0529   CL 100* 11/26/2015 1348   CL 105 05/17/2014 0529   CO2 31 11/26/2015 1348   CO2 27 05/17/2014 0529   BUN 19 11/26/2015 1348   BUN 10 05/17/2014 0529   CREATININE 1.01 11/26/2015 1348   CREATININE 0.92 07/19/2014 1557   CREATININE 0.95 05/17/2014 0529      Component Value Date/Time   CALCIUM 9.7 11/26/2015 1348   CALCIUM 8.2* 05/17/2014 0529   ALKPHOS 62 11/12/2015 1619   ALKPHOS 44* 07/22/2013 0410   AST 22 11/12/2015 1619   AST 16 07/22/2013 0410   ALT 45 11/12/2015 1619   ALT 12 07/22/2013 0410   BILITOT 0.3 11/12/2015 1619   BILITOT 0.4 07/22/2013 0410       ASSESSMENT & PLAN:   # IDA sec to Malabsorbtion from previous gastric bypass surgery. Today hemoglobin is  12.9/ferritin 327 today. Hold off any IV iron infusion.   # Hypotension symptomatic cold clammy- unclear etiology. Patient was put in a stretcher/taken to the emergency room for further workup.   # Patient will follow-up with [place] in approximately 3 months with labs-CBC BMP ferritin/iron TIBC B12/possible IV iron infusion as the time.      Cammie Sickle, MD 11/27/2015 1:43 PM

## 2015-11-27 NOTE — ED Notes (Signed)
Pt comes into the ED from the cancer center with c/o hypotensive, diaphoretic, and sudden onset of nausea.  Patient is being treated for anemia and has history of gastric bypass.  Patient A&Ox4, in NAD at this time, respirations even and unlabored.  Patient has had a history of blood transfusions in the past gastric ulcers.

## 2015-11-27 NOTE — ED Provider Notes (Signed)
  Physical Exam  BP 133/91 mmHg  Pulse 74  Temp(Src) 98.3 F (36.8 C) (Oral)  Resp 13  Ht 5\' 8"  (1.727 m)  Wt 185 lb (83.915 kg)  BMI 28.14 kg/m2  SpO2 93%  Physical Exam  ED Course  Procedures  MDM Care assumed at 3:30 pm from Dr. Jacqualine Code. Patient here With hypotension and left lower quadrant pain and almost passed out from it. Patient's blood pressure has been stable in the ER. Sign out pending labs, CT ab/pel, delta trop. CT showed diverticulitis with no abscess. WBC nl, afebrile, not septic. Patient allergic to levaquin, given zosyn. Will dc home with augmentin.   Wandra Arthurs, MD 11/27/15 [id number]

## 2015-11-27 NOTE — ED Provider Notes (Addendum)
[facility] Emergency Department Provider Note  ____________________________________________  Time seen: Approximately 2:33 PM  I have reviewed the triage vital signs and the nursing notes.   HISTORY  Chief Complaint Hypotension    HPI Justin Patel is a 65 y.o. male with a history of gastric bypass, previous ulcers, kidney stones.  He reports is been feeling normal, he went to the doctor's office for a evaluation to make sure this hemoglobin levels have been stable in case he needed IV iron infusion. While he was sitting in a doctor's office he began to feel lightheaded as though he is going to pass out. He did not pass out, but was able to lay down. He started feeling very sweaty. He is not having any chest pain or trouble breathing, he does relate to these been having a slight sharp discomfort in his left flank for about the last day. Denies any other symptoms. No fevers or chills. States he no longer feels lightheaded after laying on the stretcher for a while.  States eating and drinking normally. No headache, numbness weakness or other concerns at this time.   Past Medical History  Diagnosis Date  . Hypertension   . Anxiety   . Sleep apnea     hx not now since wt loss  . Stones in the urinary tract   . GERD (gastroesophageal reflux disease)   . H/O hiatal hernia   . Headache(784.0)   . Arthritis   . 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 Gastric ulcer   . Degenerative disc disease   . Sacral fracture, closed ([place])   . Syncope and collapse   . Chronic kidney disease   . Heart murmur   . Hyperlipidemia   . Hx of congestive heart failure   . Iron deficiency anemia 02/28/2015  . Diabetes mellitus     no longer diabetic-or on meds    Patient Active Problem List   Diagnosis Date Noted  . Other iron deficiency anemias 11/27/2015  . Bilateral edema of lower extremity 11/12/2015  . Recurrent falls 07/25/2015  . Head injury  07/18/2015  . Multiple rib fractures 07/18/2015  . Opiate abuse, continuous 06/20/2015  . Chronic pain syndrome 04/16/2015  . Alcohol abuse 10/01/2014  . SDH (subdural hematoma) ([place]) 06/03/2014  . 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 Benign prostatic hyperplasia with urinary obstruction 11/16/2013  . Enlarged prostate 11/08/2013  . Anemia 11/08/2013  . H/O urinary stone 10/05/2013  . Renal cyst 08/27/2013  . Essential hypertension, benign 03/21/2013  . Other and unspecified hyperlipidemia 03/21/2013  . Obesity, unspecified 03/21/2013    Past Surgical History  Procedure Laterality Date  . Hernia repair  2000    hiatal  . Gastric bypass  2010    [place]  . Knee arthroscopy      bilateral, left x 2  . Osteotomy  2001    left  . Tonsillectomy    . Appendectomy    . Nasal sinus surgery      x5  . Uvulopalatoplasty  2011  . Anterior cervical decomp/discectomy fusion  01/26/2012    Procedure: ANTERIOR CERVICAL DECOMPRESSION/DISCECTOMY FUSION 3 LEVELS;  Surgeon: Floyce Stakes, MD;  Location: [facility];  Service: Neurosurgery;  Laterality: N/A;  Cervical three-four Cervical four-five Cervical five-six Cervical six-seven , Anterior cervical decompression/diskectomy, fusion, plate  . Total knee arthroplasty Left 05/2014    Dr. Marry Guan  . Cardiac catheterization  [facility], normal  . Cardiac catheterization      [place]  . Joint replacement      Current Outpatient Rx  Name  Route  Sig  Dispense  Refill  . acetaminophen (TYLENOL) 500 MG tablet   Oral   Take 1,000 mg by mouth every 6 (six) hours as needed.         Marland Kitchen amLODipine (NORVASC) 5 MG tablet      take 1 tablet by mouth daily Patient not taking: Reported on 11/25/2015   90 tablet   3   . cyclobenzaprine (FLEXERIL) 5 MG tablet      take 1 tablet by mouth three times a day if needed for muscle spasm   30 tablet   5   . DULoxetine (CYMBALTA) 60 MG capsule      take 1 capsule by  mouth once daily   30 capsule   5   . furosemide (LASIX) 20 MG tablet   Oral   Take 1 tablet (20 mg total) by mouth daily. Patient not taking: Reported on 11/27/2015   30 tablet   3   . levETIRAcetam (KEPPRA) 500 MG tablet      take 1 tablet by mouth twice a day   60 tablet   9   . losartan (COZAAR) 100 MG tablet      take 1 tablet by mouth once daily   30 tablet   5   . metoprolol succinate (TOPROL-XL) 50 MG 24 hr tablet      take 1 tablet by mouth once daily   30 tablet   6   . NEXIUM 40 MG capsule      take 1 capsule by mouth once daily   30 capsule   10   . oxyCODONE-acetaminophen (ROXICET) 5-325 MG tablet      Limit 1/2  -  1 tab by mouth per day or twice per day if tolerated   40 tablet   0   . rosuvastatin (CRESTOR) 10 MG tablet   Oral   Take 1 tablet (10 mg total) by mouth daily.   30 tablet   5   . sucralfate (CARAFATE) 1 G tablet      take 1 tablet by mouth four times a day   120 tablet   11   . tamsulosin (FLOMAX) 0.4 MG CAPS capsule   Oral   Take 0.4 mg by mouth daily.         Marland Kitchen VITAMIN D, CHOLECALCIFEROL, PO   Oral   Take 1,000 mg by mouth daily.            Allergies Altace; Levaquin; Lyrica; Metformin; and Iohexol  Family History  Problem Relation Age of Onset  . Dementia Mother 58  . Heart disease Father 54  . Diabetes Father   . Lymphoma Sister     lymphoma, stage 4  . Ovarian cancer Sister 52    Ovarian  . Lupus Sister     Social History Social History  Substance Use Topics  . Smoking status: Never Smoker   . Smokeless tobacco: Never Used  . Alcohol Use: No     Comment: stopped 32months ago    Review of Systems Constitutional: No fever/chills Eyes: No visual changes. ENT: No sore throat. Cardiovascular: Denies chest pain. Respiratory: Denies shortness of breath. Gastrointestinal: No nausea, no vomiting.  No diarrhea.  No constipation. Genitourinary: Negative for dysuria. Musculoskeletal: Negative for  back pain. Skin: Negative for  rash. Neurological: Negative for headaches, focal weakness or numbness.  10-point ROS otherwise negative.  ____________________________________________   PHYSICAL EXAM:  VITAL SIGNS: ED Triage Vitals  Enc Vitals Group     BP 11/27/15 1418 114/85 mmHg     Pulse Rate 11/27/15 1418 82     Resp 11/27/15 1418 12     Temp 11/27/15 1418 98.3 F (36.8 C)     Temp Source 11/27/15 1418 Oral     SpO2 11/27/15 1418 96 %     Weight 11/27/15 1418 185 lb (83.915 kg)     Height 11/27/15 1418 5\' 8"  (1.727 m)     Head Cir --      Peak Flow --      Pain Score --      Pain Loc --      Pain Edu? --      Excl. in [place]? --    Constitutional: Alert and oriented. Diaphoretic, fatigued appearing but in no distress. Very conversant, pleasant. Eyes: Conjunctivae are normal. PERRL. EOMI. Head: Atraumatic. Nose: No congestion/rhinnorhea. Mouth/Throat: Mucous membranes are moist.  Oropharynx non-erythematous. Neck: No stridor.   Cardiovascular: Normal rate, regular rhythm. Grossly normal heart sounds.  Good peripheral circulation. Respiratory: Normal respiratory effort.  No retractions. Lungs CTAB. Gastrointestinal: Soft and nontender except for some mild to moderate discomfort noted over the left flank without rebound or guarding. No distention.  No CVA tenderness. Musculoskeletal: No lower extremity tenderness nor edema.  No joint effusions. Neurologic:  Normal speech and language. No gross focal neurologic deficits are appreciated.Skin:  Skin is warm, diaphoretic and intact. No rash noted. Psychiatric: Mood and affect are normal. Speech and behavior are normal.  ____________________________________________   LABS (all labs ordered are listed, but only abnormal results are displayed)  Labs Reviewed  CBC - Abnormal; Notable for the following:    RBC 3.93 (*)    Hemoglobin 12.8 (*)    HCT 38.2 (*)    All other components within normal limits  LACTIC ACID, PLASMA   COMPREHENSIVE METABOLIC PANEL  LIPASE, BLOOD  TROPONIN I  LACTIC ACID, PLASMA  URINALYSIS COMPLETEWITH MICROSCOPIC ([facility] ONLY)  TROPONIN I  TYPE AND SCREEN  ABO/RH   ____________________________________________  EKG  ED ECG REPORT I, QUALE, MARK, the attending physician, personally viewed and interpreted this ECG.  Date: 11/27/2015 EKG Time: 1445 Rate: 75 Rhythm: normal sinus rhythm QRS Axis: normal Intervals: normal ST/T Wave abnormalities: normal Conduction Disturbances: none Narrative Interpretation: unremarkable  ____________________________________________  RADIOLOGY  CT abdomen and pelvis pending  ____________________________________________   PROCEDURES  Procedure(s) performed: None  Critical Care performed: No  ____________________________________________   INITIAL IMPRESSION / ASSESSMENT AND PLAN / ED COURSE  Pertinent labs & imaging results that were available during my care of the patient were reviewed by me and considered in my medical decision making (see chart for details).  Patient presents with some left flank pain which is been experiencing for the last day. Not sure if this is related, but he developed lightheadedness today at the doctor's office. Reports he is feeling fine previous except for some slight left flank pain which he does not report is concerning. He does have a history of GI bleeding, though his guaiac today is negative. His hemoglobin is stable. His vital signs stable in the ER, but he was quite hypotensive especially with sitting in the doctor's office. Definitely an element of orthostatic hypotension, though overall he appears much better now.  No acute neurologic, cardiac, or pulmonary  symptoms. EKG reassuring, with no ST abnormality. Not complaining of chest pain.  Based on the patient's hypotension and associated left flank discomfort we will obtain CT imaging to further evaluate and continue to observe him closely in the  emergency room.  Differential diagnosis includes but is not limited to, abdominal perforation, aortic dissection, cholecystitis, appendicitis, diverticulitis, colitis, esophagitis/gastritis, kidney stone, pyelonephritis, urinary tract infection, aortic aneurysm. All are considered in decision and treatment plan. Based upon the patient's presentation and risk factors, we'll proceed with abdominal labs, close observation and CT imaging to further evaluate. Discussed with Dr. Clovis Riley of radiology, the patient will be pretreated prior to CT. Patient reports he has had many CAT scans with IV contrast without issue given pretreatment, and that the reaction he did have was in the 1970s to some type of dye injection during a kidney study or he developed hives but no swelling of the lips face or "anaphylaxis". He reports he's had no problems with IV contrast since that time.  We will proceed with CT to evaluate for the possibility of vascular lesions, in addition to evaluate for possible evidence of gastric ulcer, dehiscence, perforation, or other potential such as kidney stone.  ----------------------------------------- 3:38 PM on 11/27/2015 -----------------------------------------  Patient resting comfortably at this time. Reports he does feel improved.  Ongoing care and disposition assigned to Dr. Shirlyn Goltz. Follow-up on pending labs including metabolic panel, CT abdomen and pelvis, troponin, and will observe closely for recurrent hypotension or symptoms in the ED. Recommend minimum 3 hour observation and obtaining a second troponin at 3 hours (ordered). A repeat EKG is pending at this time, though the first one was normal.    ____________________________________________   FINAL CLINICAL IMPRESSION(S) / ED DIAGNOSES  Final diagnoses:  Hypotension, unspecified hypotension type  Left lateral abdominal pain      Delman Kitten, MD 11/27/15 1540  Repeat EKG reviewed and interpreted by me at  1520 Heart rate 75 QRS 100 QTc 450 Normal sinus rhythm, normal T waves, no evidence of ischemic abnormality.  Delman Kitten, MD 11/27/15 [phone]

## 2015-11-27 NOTE — Progress Notes (Signed)
Pt here presented to clinic for IV iron infusion today.  Pt became diaphoretic in exam room. Sweating profusely.  States, "I feel like I'm going to pass out. I am very nauseated. I feel the urge to poop loose stools."  Orthostatic vitals. BP hypotensive 87/61 sitting in exam room chair. Attempted to stand patient up for orthostatic bp. BP 80/50. Pt stated at that time that he was going to "pass out."  Dr. Rogue Bussing asked pt to be transported by stretcher to ED to evaluate diaphoresis and hypotension. I spoke with charge nurse Marya Amsler. He asked pt to be transported to rm 25 in ED.

## 2015-11-27 NOTE — Discharge Instructions (Signed)
Take augmentin twice daily for 10 days.   Stay hydrated.   Take tylenol, motrin for pain.   See your doctor   Return to ER if you have worse abdominal pain, vomiting, passing out, fever

## 2015-11-27 NOTE — ED Notes (Signed)
MD at bedside. 

## 2015-11-28 LAB — ABO/RH: ABO/RH(D): AB POS

## 2015-11-30 LAB — TOXASSURE SELECT 13 (MW), URINE: PDF: 0

## 2015-12-01 ENCOUNTER — Encounter: Payer: Self-pay | Admitting: Pain Medicine

## 2015-12-01 ENCOUNTER — Ambulatory Visit: Payer: BC Managed Care – PPO | Attending: Pain Medicine | Admitting: Pain Medicine

## 2015-12-01 DIAGNOSIS — S2242XA Multiple fractures of ribs, left side, initial encounter for closed fracture: Secondary | ICD-10-CM

## 2015-12-01 DIAGNOSIS — M545 Low back pain: Secondary | ICD-10-CM | POA: Diagnosis present

## 2015-12-01 DIAGNOSIS — M5136 Other intervertebral disc degeneration, lumbar region: Secondary | ICD-10-CM | POA: Insufficient documentation

## 2015-12-01 DIAGNOSIS — M2578 Osteophyte, vertebrae: Secondary | ICD-10-CM | POA: Insufficient documentation

## 2015-12-01 DIAGNOSIS — M51369 Other intervertebral disc degeneration, lumbar region without mention of lumbar back pain or lower extremity pain: Secondary | ICD-10-CM

## 2015-12-01 DIAGNOSIS — M5416 Radiculopathy, lumbar region: Secondary | ICD-10-CM

## 2015-12-01 DIAGNOSIS — M47816 Spondylosis without myelopathy or radiculopathy, lumbar region: Secondary | ICD-10-CM

## 2015-12-01 DIAGNOSIS — Q761 Klippel-Feil syndrome: Secondary | ICD-10-CM

## 2015-12-01 DIAGNOSIS — M1288 Other specific arthropathies, not elsewhere classified, other specified site: Secondary | ICD-10-CM | POA: Insufficient documentation

## 2015-12-01 DIAGNOSIS — M79606 Pain in leg, unspecified: Secondary | ICD-10-CM | POA: Diagnosis present

## 2015-12-01 DIAGNOSIS — M533 Sacrococcygeal disorders, not elsewhere classified: Secondary | ICD-10-CM

## 2015-12-01 MED ORDER — BUPIVACAINE HCL (PF) 0.25 % IJ SOLN
INTRAMUSCULAR | Status: AC
  Administered 2015-12-01: 15:00:00
  Filled 2015-12-01: qty 30

## 2015-12-01 MED ORDER — CEFAZOLIN SODIUM 1-5 GM-% IV SOLN: 1.0000 g | Freq: Once | INTRAVENOUS | Status: DC

## 2015-12-01 MED ORDER — CEFUROXIME AXETIL 250 MG PO TABS: 250.0000 mg | ORAL_TABLET | Freq: Two times a day (BID) | ORAL | Status: DC

## 2015-12-01 MED ORDER — LIDOCAINE HCL (PF) 1 % IJ SOLN: 10.0000 mL | Freq: Once | INTRAMUSCULAR | Status: DC

## 2015-12-01 MED ORDER — CEFAZOLIN SODIUM 1 G IJ SOLR
INTRAMUSCULAR | Status: AC
  Administered 2015-12-01: 15:00:00
  Filled 2015-12-01: qty 10

## 2015-12-01 MED ORDER — FENTANYL CITRATE (PF) 100 MCG/2ML IJ SOLN
INTRAMUSCULAR | Status: AC
  Administered 2015-12-01: 100 ug via INTRAVENOUS
  Filled 2015-12-01: qty 2

## 2015-12-01 MED ORDER — MIDAZOLAM HCL 5 MG/5ML IJ SOLN: 5.0000 mg | Freq: Once | INTRAMUSCULAR | Status: DC

## 2015-12-01 MED ORDER — MIDAZOLAM HCL 5 MG/5ML IJ SOLN
INTRAMUSCULAR | Status: AC
  Administered 2015-12-01: 3 mg via INTRAVENOUS
  Filled 2015-12-01: qty 5

## 2015-12-01 MED ORDER — LACTATED RINGERS IV SOLN: 1000.0000 mL | INTRAVENOUS | Status: DC

## 2015-12-01 MED ORDER — LIDOCAINE HCL (PF) 1 % IJ SOLN
INTRAMUSCULAR | Status: AC
  Administered 2015-12-01: 15:00:00
  Filled 2015-12-01: qty 10

## 2015-12-01 MED ORDER — FENTANYL CITRATE (PF) 100 MCG/2ML IJ SOLN: 100.0000 ug | Freq: Once | INTRAMUSCULAR | Status: DC

## 2015-12-01 MED ORDER — ORPHENADRINE CITRATE 30 MG/ML IJ SOLN: 60.0000 mg | Freq: Once | INTRAMUSCULAR | Status: DC

## 2015-12-01 MED ORDER — BUPIVACAINE HCL (PF) 0.25 % IJ SOLN: 30.0000 mL | Freq: Once | INTRAMUSCULAR | Status: DC

## 2015-12-01 NOTE — Progress Notes (Signed)
Subjective:    Patient ID: Justin Patel, male    DOB: 21-Nov-1950, 65 y.o.   MRN: [id number]  HPI  PROCEDURE PERFORMED: Radiofrequency rhizolysis (lunbar facet medial branch nerve radiofrequency rhizolysis).  The procedure was performed with the COOLIEF technique.  COOLIEF technique was performed using the Synergy cooled radiofrequency probe [id number]-17-150-4, the synergy cooled radiofrequency introducer [id number]-[id number], and the synergy cooled sterile tube kit [id number].   HISTORY: The patient is a 65 y.o. male who returns to the [place] for further evaluation of severely disabling pain involving the lumbar and lower extremity region.  MRI revealed Multilevel degenerative disc disease primarily posterior disc osteophytes. Lateral recess narrowing at L3-4 through L4-5  There is concern regarding significant component of patient's pain being due to facet arthropathy with facet syndrome. The patient has had significant relief of pain following previous lumbar facet, medial branch nerve, blocks. Radiofrequency rhizolysis of the lumbar facets, medial branch nerves, will be performed at this time an attempt to provide longer lasting relief of the patient's pain, minimize progression of the patient's symptoms, and avoid the need for more involved treatment. The risks, benefits and expectations of the procedure have been discussed and explained to the patient who was with understanding and wished to proceed with interventional treatment in an attempt to decrease severely  disabling pain of the lumbar and lower extremity region.  We will proceed with what is felt to be a medically necessary procedure, radiofrequency rhizolysis (lumbar facet medial branch nerve radiofrequency rhizolysis) using the COOLIEF technique.  DESCRIPTION OF PROCEDURE: COOLIEF Technique Radiofrequency Rhizolysis of Lumbar Facets (Medial Branch Nerves Radiofrequency Rhizolysis) with IV Versed, IV Fentanyl conscious sedation, EKG,  blood pressure, pulse, capnography, and pulse oximetry monitoring.  The procedure was performed with the patient in the prone position under fluoroscopic guidance.  Following Betadine prep of the proposed entry site, a 1.5% lidocaine plain skin wheal of the proposed needle entry site was  prepared in oblique orientation.   Right, L5 Radiofrequency Rhizolysis L5 Facet  (Medial Branch Nerve): Under fluoroscopic guidance, the radiofrequency needle was inserted at the L5 vertebral body level after a local anesthetic skin wheal of 1.5% lidocaine plain and Betadine prep of the proposed needle entry site was performed.  The needle was inserted under fluoroscopic guidance in the area known as Burton's eye or eye of the Scotty dog with needle placed at the superior and lateral border of the targeted area with oblique orientation utilizing the tunnel (gun  barrel approach) technique to the targeted area.  Right Radiofrequency Rhizolysis at L4 and L3  Lumbar Facets (Medial Branch Nerves) The procedure was performed at L4 and L3 exactly as was performed at the L5 and under fluoroscopic guidance utilizing the identical technique as utilized at the L5 vertebral body level.   Needle was then verified on lateral view and following needle placement verification on lateral view, radiofrequency testing was then carried out with motor testing at 2 Hz stimulation without evidence of stimulation of the lower extremity. Following radiofrequency testing for motor testing, each radiofrequency probe was then prepared with 2 mL of preservative-free lidocaine and following 1 minute of anesthetizing each proposed radiofrequency lesioning  Level, the radiofrequency probe was then inserted in the right, L5 vertebral body level needle with radiofrequency lesioning carried out for 2-1/2 minutes with temperature of the tissue being maintained at 80 degrees centigrade for 2-1/2 minutes.  Radiofrequency probe was then inserted in the right,  L4 vertebral body level needle and  radiofrequency lesioning was performed at 80 degrees centigrade tissue temperature for 2-1/2 minutes.  Radiofrequency probe was then inserted in the right L3 vertebral body level needle and radiofrequency lesioning was performed at 80 degrees centigrade tissue temperature for 2-1/2 minutes  The patient tolerated the procedure well.   PLAN: 1. Medications: The patient will continue the present medications. 2. The patient will follow up with Dr. Ronette Deter regarding blood pressure and general medical condition as discussed with the patient.  3. Surgical evaluation as discussed. 4. Neurological evaluation as discussed.  5. The patient has been advised to call the [facility] prior to scheduled return appointment should there be significant change in the patient's condition or should the patient have other concerns regarding condition prior to scheduled return appointment.  The patient is with understanding and is in agreement with suggested treatment plan.    Review of Systems     Objective:   Physical Exam        Assessment & Plan:

## 2015-12-01 NOTE — Patient Instructions (Addendum)
PLAN   Continue present medications oxycodone acetaminophen. Please obtain Ceftin antibiotic today and begin taking Ceftin antibiotic today as prescribed   F/U PCP Dr Ronette Deter  for evaliation of  BP and general medical  condition  F/U surgical evaluation. May consider pending follow-up evaluations  F/U neurological evaluation. May consider PNCV/EMG studies and other studies pending follow-up evaluations  May consider radiofrequency rhizolysis or intraspinal procedures pending response to present treatment and F/U evaluation   Patient to call [facility] should patient have concerns prior to scheduled return appointmentRadiofrequency Lesioning, Care After Refer to this sheet in the next few weeks. These instructions provide you with information about caring for yourself after your procedure. Your health care provider may also give you more specific instructions. Your treatment has been planned according to current medical practices, but problems sometimes occur. Call your health care provider if you have any problems or questions after your procedure. WHAT TO EXPECT AFTER THE PROCEDURE After your procedure, it is common to have:  Pain from the burned nerve.  Temporary numbness. HOME CARE INSTRUCTIONS  Take over-the-counter and prescription medicines only as told by your health care provider.  Return to your normal activities as told by your health care provider. Ask your health care provider what activities are safe for you.  Pay close attention to how you feel after the procedure. If you start to have pain, write down when it hurts and how it feels. This will help you and your health care provider to know if you need an additional treatment.  Check your needle insertion site every day for signs of infection. Watch for:  Redness, swelling, or pain.  Fluid, blood, or pus.  Keep all follow-up visits as told by your health care provider. This is important. SEEK  MEDICAL CARE IF:  Your pain does not get better.  You have redness, swelling, or pain at the needle insertion site.  You have fluid, blood, or pus coming from the needle insertion site.  You have a fever. SEEK IMMEDIATE MEDICAL CARE IF:  You develop sudden, severe pain.  You develop numbness or tingling near the procedure site that does not go away.   This information is not intended to replace advice given to you by your health care provider. Make sure you discuss any questions you have with your health care provider.   Document Released: 03/24/2011 Document Revised: 04/16/2015 Document Reviewed: 09/02/2014 Elsevier Interactive Patient Education 2016 [place]  What are the risk, side effects and possible complications? Generally speaking, most procedures are safe.  However, with any procedure there are risks, side effects, and the possibility of complications.  The risks and complications are dependent upon the sites that are lesioned, or the type of nerve block to be performed.  The closer the procedure is to the spine, the more serious the risks are.  Great care is taken when placing the radio frequency needles, block needles or lesioning probes, but sometimes complications can occur.  Infection: Any time there is an injection through the skin, there is a risk of infection.  This is why sterile conditions are used for these blocks.  There are four possible types of infection.  Localized skin infection.  Central Nervous System Infection-This can be in the form of Meningitis, which can be deadly.  Epidural Infections-This can be in the form of an epidural abscess, which can cause pressure inside of the spine, causing compression of the spinal cord with subsequent  paralysis. This would require an emergency surgery to decompress, and there are no guarantees that the patient would recover from the paralysis.  Discitis-This is an infection of the  intervertebral discs.  It occurs in about 1% of discography procedures.  It is difficult to treat and it may lead to surgery.        2. Pain: the needles have to go through skin and soft tissues, will cause soreness.       3. Damage to internal structures:  The nerves to be lesioned may be near blood vessels or    other nerves which can be potentially damaged.       4. Bleeding: Bleeding is more common if the patient is taking blood thinners such as  aspirin, Coumadin, Ticiid, Plavix, etc., or if he/she have some genetic predisposition  such as hemophilia. Bleeding into the spinal canal can cause compression of the spinal  cord with subsequent paralysis.  This would require an emergency surgery to  decompress and there are no guarantees that the patient would recover from the  paralysis.       5. Pneumothorax:  Puncturing of a lung is a possibility, every time a needle is introduced in  the area of the chest or upper back.  Pneumothorax refers to free air around the  collapsed lung(s), inside of the thoracic cavity (chest cavity).  Another two possible  complications related to a similar event would include: Hemothorax and Chylothorax.   These are variations of the Pneumothorax, where instead of air around the collapsed  lung(s), you may have blood or chyle, respectively.       6. Spinal headaches: They may occur with any procedures in the area of the spine.       7. Persistent CSF (Cerebro-Spinal Fluid) leakage: This is a rare problem, but may occur  with prolonged intrathecal or epidural catheters either due to the formation of a fistulous  track or a dural tear.       8. Nerve damage: By working so close to the spinal cord, there is always a possibility of  nerve damage, which could be as serious as a permanent spinal cord injury with  paralysis.       9. Death:  Although rare, severe deadly allergic reactions known as "Anaphylactic  reaction" can occur to any of the medications used.       10. Worsening of the symptoms:  We can always make thing worse.  What are the chances of something like this happening? Chances of any of this occuring are extremely low.  By statistics, you have more of a chance of getting killed in a motor vehicle accident: while driving to the hospital than any of the above occurring .  Nevertheless, you should be aware that they are possibilities.  In general, it is similar to taking a shower.  Everybody knows that you can slip, hit your head and get killed.  Does that mean that you should not shower again?  Nevertheless always keep in mind that statistics do not mean anything if you happen to be on the wrong side of them.  Even if a procedure has a 1 (one) in a 1,000,000 (million) chance of going wrong, it you happen to be that one..Also, keep in mind that by statistics, you have more of a chance of having something go wrong when taking medications.  Who should not have this procedure? If you are on a blood thinning medication (e.g. Coumadin, Plavix,  see list of "Blood Thinners"), or if you have an active infection going on, you should not have the procedure.  If you are taking any blood thinners, please inform your physician.  How should I prepare for this procedure?  Do not eat or drink anything at least six hours prior to the procedure.  Bring a driver with you .  It cannot be a taxi.  Come accompanied by an adult that can drive you back, and that is strong enough to help you if your legs get weak or numb from the local anesthetic.  Take all of your medicines the morning of the procedure with just enough water to swallow them.  If you have diabetes, make sure that you are scheduled to have your procedure done first thing in the morning, whenever possible.  If you have diabetes, take only half of your insulin dose and notify our nurse that you have done so as soon as you arrive at the clinic.  If you are diabetic, but only take blood sugar pills (oral  hypoglycemic), then do not take them on the morning of your procedure.  You may take them after you have had the procedure.  Do not take aspirin or any aspirin-containing medications, at least eleven (11) days prior to the procedure.  They may prolong bleeding.  Wear loose fitting clothing that may be easy to take off and that you would not mind if it got stained with Betadine or blood.  Do not wear any jewelry or perfume  Remove any nail coloring.  It will interfere with some of our monitoring equipment.  NOTE: Remember that this is not meant to be interpreted as a complete list of all possible complications.  Unforeseen problems may occur.  BLOOD THINNERS The following drugs contain aspirin or other products, which can cause increased bleeding during surgery and should not be taken for 2 weeks prior to and 1 week after surgery.  If you should need take something for relief of minor pain, you may take acetaminophen which is found in Tylenol,m Datril, Anacin-3 and Panadol. It is not blood thinner. The products listed below are.  Do not take any of the products listed below in addition to any listed on your instruction sheet.  A.P.C or A.P.C with Codeine Codeine Phosphate Capsules #3 Ibuprofen Ridaura  ABC compound Congesprin Imuran rimadil  Advil Cope Indocin Robaxisal  Alka-Seltzer Effervescent Pain Reliever and Antacid Coricidin or Coricidin-D  Indomethacin Rufen  Alka-Seltzer plus Cold Medicine Cosprin Ketoprofen S-A-C Tablets  Anacin Analgesic Tablets or Capsules Coumadin Korlgesic Salflex  Anacin Extra Strength Analgesic tablets or capsules CP-2 Tablets Lanoril Salicylate  Anaprox Cuprimine Capsules Levenox Salocol  Anexsia-D Dalteparin Magan Salsalate  Anodynos Darvon compound Magnesium Salicylate Sine-off  Ansaid Dasin Capsules Magsal Sodium Salicylate  Anturane Depen Capsules Marnal Soma  APF Arthritis pain formula Dewitt's Pills Measurin Stanback  Argesic Dia-Gesic Meclofenamic  Sulfinpyrazone  Arthritis Bayer Timed Release Aspirin Diclofenac Meclomen Sulindac  Arthritis pain formula Anacin Dicumarol Medipren Supac  Analgesic (Safety coated) Arthralgen Diffunasal Mefanamic Suprofen  Arthritis Strength Bufferin Dihydrocodeine Mepro Compound Suprol  Arthropan liquid Dopirydamole Methcarbomol with Aspirin Synalgos  ASA tablets/Enseals Disalcid Micrainin Tagament  Ascriptin Doan's Midol Talwin  Ascriptin A/D Dolene Mobidin Tanderil  Ascriptin Extra Strength Dolobid Moblgesic Ticlid  Ascriptin with Codeine Doloprin or Doloprin with Codeine Momentum Tolectin  Asperbuf Duoprin Mono-gesic Trendar  Aspergum Duradyne Motrin or Motrin IB Triminicin  Aspirin plain, buffered or enteric coated Durasal Myochrisine Trigesic  Aspirin  Suppositories Easprin Nalfon Trillsate  Aspirin with Codeine Ecotrin Regular or Extra Strength Naprosyn Uracel  Atromid-S Efficin Naproxen Ursinus  Auranofin Capsules Elmiron Neocylate Vanquish  Axotal Emagrin Norgesic Verin  Azathioprine Empirin or Empirin with Codeine Normiflo Vitamin E  Azolid Emprazil Nuprin Voltaren  Bayer Aspirin plain, buffered or children's or timed BC Tablets or powders Encaprin Orgaran Warfarin Sodium  Buff-a-Comp Enoxaparin Orudis Zorpin  Buff-a-Comp with Codeine Equegesic Os-Cal-Gesic   Buffaprin Excedrin plain, buffered or Extra Strength Oxalid   Bufferin Arthritis Strength Feldene Oxphenbutazone   Bufferin plain or Extra Strength Feldene Capsules Oxycodone with Aspirin   Bufferin with Codeine Fenoprofen Fenoprofen Pabalate or Pabalate-SF   Buffets II Flogesic Panagesic   Buffinol plain or Extra Strength Florinal or Florinal with Codeine Panwarfarin   Buf-Tabs Flurbiprofen Penicillamine   Butalbital Compound Four-way cold tablets Penicillin   Butazolidin Fragmin Pepto-Bismol   Carbenicillin Geminisyn Percodan   Carna Arthritis Reliever Geopen Persantine   Carprofen Gold's salt Persistin   Chloramphenicol Goody's  Phenylbutazone   Chloromycetin Haltrain Piroxlcam   Clmetidine heparin Plaquenil   Cllnoril Hyco-pap Ponstel   Clofibrate Hydroxy chloroquine Propoxyphen         Before stopping any of these medications, be sure to consult the physician who ordered them.  Some, such as Coumadin (Warfarin) are ordered to prevent or treat serious conditions such as "deep thrombosis", "pumonary embolisms", and other heart problems.  The amount of time that you may need off of the medication may also vary with the medication and the reason for which you were taking it.  If you are taking any of these medications, please make sure you notify your pain physician before you undergo any procedures.

## 2015-12-01 NOTE — Progress Notes (Signed)
Safety precautions to be maintained throughout the outpatient stay will include: orient to surroundings, keep bed in low position, maintain call bell within reach at all times, provide assistance with transfer out of bed and ambulation.  

## 2015-12-02 ENCOUNTER — Telehealth: Payer: Self-pay | Admitting: *Deleted

## 2015-12-02 NOTE — Telephone Encounter (Signed)
Message left

## 2015-12-19 ENCOUNTER — Ambulatory Visit (INDEPENDENT_AMBULATORY_CARE_PROVIDER_SITE_OTHER): Payer: BC Managed Care – PPO | Admitting: Internal Medicine

## 2015-12-19 ENCOUNTER — Other Ambulatory Visit: Payer: Self-pay | Admitting: Internal Medicine

## 2015-12-19 ENCOUNTER — Telehealth: Payer: Self-pay

## 2015-12-19 VITALS — BP 102/52 | Temp 97.7°F | Resp 97 | Ht 68.0 in | Wt 197.8 lb

## 2015-12-19 DIAGNOSIS — K5732 Diverticulitis of large intestine without perforation or abscess without bleeding: Secondary | ICD-10-CM

## 2015-12-19 DIAGNOSIS — K5792 Diverticulitis of intestine, part unspecified, without perforation or abscess without bleeding: Secondary | ICD-10-CM | POA: Insufficient documentation

## 2015-12-19 DIAGNOSIS — E871 Hypo-osmolality and hyponatremia: Secondary | ICD-10-CM | POA: Diagnosis not present

## 2015-12-19 DIAGNOSIS — I10 Essential (primary) hypertension: Secondary | ICD-10-CM

## 2015-12-19 LAB — BASIC METABOLIC PANEL
BUN: 36 mg/dL — AB (ref 7–25)
CALCIUM: 9 mg/dL (ref 8.6–10.3)
CO2: 25 mmol/L (ref 20–31)
CREATININE: 1.34 mg/dL — AB (ref 0.70–1.25)
Chloride: 101 mmol/L (ref 98–110)
Glucose, Bld: 96 mg/dL (ref 65–99)
Potassium: 5.2 mmol/L (ref 3.5–5.3)
Sodium: 134 mmol/L — ABNORMAL LOW (ref 135–146)

## 2015-12-19 LAB — CBC WITH DIFFERENTIAL/PLATELET
BASOS PCT: 0.4 % (ref 0.0–3.0)
Basophils Absolute: 0 10*3/uL (ref 0.0–0.1)
Eosinophils Absolute: 0.4 10*3/uL (ref 0.0–0.7)
Eosinophils Relative: 5.4 % — ABNORMAL HIGH (ref 0.0–5.0)
HEMATOCRIT: 39.3 % (ref 39.0–52.0)
Hemoglobin: 13 g/dL (ref 13.0–17.0)
LYMPHS ABS: 2.2 10*3/uL (ref 0.7–4.0)
LYMPHS PCT: 34.3 % (ref 12.0–46.0)
MCHC: 33.1 g/dL (ref 30.0–36.0)
MCV: 98.4 fl (ref 78.0–100.0)
MONOS PCT: 5.6 % (ref 3.0–12.0)
Monocytes Absolute: 0.4 10*3/uL (ref 0.1–1.0)
NEUTROS ABS: 3.6 10*3/uL (ref 1.4–7.7)
NEUTROS PCT: 54.3 % (ref 43.0–77.0)
PLATELETS: 248 10*3/uL (ref 150.0–400.0)
RBC: 3.99 Mil/uL — ABNORMAL LOW (ref 4.22–5.81)
RDW: 14.1 % (ref 11.5–15.5)
WBC: 6.5 10*3/uL (ref 4.0–10.5)

## 2015-12-19 LAB — COMPREHENSIVE METABOLIC PANEL
ALT: 37 U/L (ref 0–53)
AST: 22 U/L (ref 0–37)
Albumin: 4.1 g/dL (ref 3.5–5.2)
Alkaline Phosphatase: 57 U/L (ref 39–117)
BILIRUBIN TOTAL: 0.3 mg/dL (ref 0.2–1.2)
BUN: 31 mg/dL — ABNORMAL HIGH (ref 6–23)
CALCIUM: 9.1 mg/dL (ref 8.4–10.5)
CHLORIDE: 109 meq/L (ref 96–112)
CO2: 23 meq/L (ref 19–32)
CREATININE: 1.55 mg/dL — AB (ref 0.40–1.50)
GFR: 48.09 mL/min — AB (ref >60.00)
GLUCOSE: 182 mg/dL — AB (ref 70–99)
Potassium: 4 mEq/L (ref 3.5–5.1)
Sodium: 125 mEq/L — ABNORMAL LOW (ref 135–145)
Total Protein: 6.6 g/dL (ref 6.0–8.3)

## 2015-12-19 NOTE — Assessment & Plan Note (Signed)
BP Readings from Last 3 Encounters:  12/19/15 102/52  12/01/15 158/99  11/27/15 146/92   BP low today. We discussed potentially decreasing the Amlodipine to 2.5mg  daily. Will monitor for now. Renal function with labs.

## 2015-12-19 NOTE — Assessment & Plan Note (Signed)
Reviewed recent ER evaluation for diverticulitis. Symptomatically doing well. Mild TTP LLQ on exam. Will recheck CBC and CMP today.

## 2015-12-19 NOTE — Addendum Note (Signed)
Addended by: Leeanne Rio on: 12/19/2015 04:46 PM   Modules accepted: Orders

## 2015-12-19 NOTE — Progress Notes (Signed)
Subjective:    Patient ID: Justin Patel, male    DOB: [birth year], 65 y.o.   MRN: [id number]  HPI  65YO male presents for follow up.  Recently seen in the ER for diverticulitis. Developed fairly sudden onset of abdominal pain and hypotension while at oncology office. Sent to ER. CT abdomen showed diverticulitis without perforation or abscess. Treated with Cefuroxime. Abdominal pain improved. Tolerating a regular diet.  Chronic pain - Recently had RFA of nerves in back.  Wt Readings from Last 3 Encounters:  12/19/15 197 lb 12.8 oz (89.721 kg)  12/01/15 180 lb (81.647 kg)  11/27/15 185 lb (83.915 kg)   BP Readings from Last 3 Encounters:  12/19/15 102/52  12/01/15 158/99  11/27/15 146/92    Past Medical History  Diagnosis Date  . Hypertension   . Anxiety   . Sleep apnea     hx not now since wt loss  . Stones in the urinary tract   . GERD (gastroesophageal reflux disease)   . H/O hiatal hernia   . Headache(784.0)   . Arthritis   . 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 Gastric ulcer   . Degenerative disc disease   . Sacral fracture, closed ([place])   . Syncope and collapse   . Chronic kidney disease   . Heart murmur   . Hyperlipidemia   . Hx of congestive heart failure   . Iron deficiency anemia 02/28/2015  . Diabetes mellitus     no longer diabetic-or on meds  . CHF (congestive heart failure) (HCC)    Family History  Problem Relation Age of Onset  . Dementia Mother 39  . Heart disease Father 61  . Diabetes Father   . Lymphoma Sister     lymphoma, stage 4  . Ovarian cancer Sister 16    Ovarian  . Lupus Sister    Past Surgical History  Procedure Laterality Date  . Hernia repair  2000    hiatal  . Gastric bypass  2010    [place]  . Knee arthroscopy      bilateral, left x 2  . Osteotomy  2001    left  . Tonsillectomy    . Appendectomy    . Nasal sinus surgery      x5  . Uvulopalatoplasty  2011  . Anterior cervical  decomp/discectomy fusion  01/26/2012    Procedure: ANTERIOR CERVICAL DECOMPRESSION/DISCECTOMY FUSION 3 LEVELS;  Surgeon: Floyce Stakes, MD;  Location: [facility];  Service: Neurosurgery;  Laterality: N/A;  Cervical three-four Cervical four-five Cervical five-six Cervical six-seven , Anterior cervical decompression/diskectomy, fusion, plate  . Total knee arthroplasty Left 05/2014    Dr. Marry Guan  . Cardiac catheterization      [facility], normal  . Cardiac catheterization      [place]  . Joint replacement     Social History   Social History  . Marital Status: Married    Spouse Name: N/A  . Number of Children: N/A  . Years of Education: N/A   Social History Main Topics  . Smoking status: Never Smoker   . Smokeless tobacco: Never Used  . Alcohol Use: No     Comment: stopped 22months ago  . Drug Use: No  . Sexual Activity:    Partners: Female   Other Topics Concern  . Not on file   Social History Narrative   Lives in [place] with wife, [place]. 2 sons, Justin Patel  52, Justin Patel 30.. 4 grandchildren      Work - Retired, previously taught music in [place] - regular diet, limited quantities after gastric bypass      Exercise - no regular, limited by arthritis in knees, occasional water aerobics    Review of Systems  Constitutional: Negative for fever, chills, activity change, appetite change, fatigue and unexpected weight change.  Eyes: Negative for visual disturbance.  Respiratory: Negative for cough and shortness of breath.   Cardiovascular: Negative for chest pain, palpitations and leg swelling.  Gastrointestinal: Positive for abdominal pain. Negative for nausea, vomiting, diarrhea, constipation and abdominal distention.  Genitourinary: Negative for dysuria, urgency and difficulty urinating.  Musculoskeletal: Positive for myalgias, back pain and arthralgias. Negative for gait problem.  Skin: Negative for color change and rash.    Neurological: Negative for syncope and light-headedness.  Hematological: Negative for adenopathy.  Psychiatric/Behavioral: Negative for sleep disturbance and dysphoric mood. The patient is not nervous/anxious.        Objective:    BP 102/52 mmHg  Temp(Src) 97.7 F (36.5 C) (Oral)  Resp 97  Ht 5\' 8"  (1.727 m)  Wt 197 lb 12.8 oz (89.721 kg)  BMI 30.08 kg/m2  SpO2 98% Physical Exam  Constitutional: He is oriented to person, place, and time. He appears well-developed and well-nourished. No distress.  HENT:  Head: Normocephalic and atraumatic.  Right Ear: External ear normal.  Left Ear: External ear normal.  Nose: Nose normal.  Mouth/Throat: Oropharynx is clear and moist. No oropharyngeal exudate.  Eyes: Conjunctivae and EOM are normal. Pupils are equal, round, and reactive to light. Right eye exhibits no discharge. Left eye exhibits no discharge. No scleral icterus.  Neck: Normal range of motion. Neck supple. No tracheal deviation present. No thyromegaly present.  Cardiovascular: Normal rate, regular rhythm and normal heart sounds.  Exam reveals no gallop and no friction rub.   No murmur heard. Pulmonary/Chest: Effort normal and breath sounds normal. No respiratory distress. He has no wheezes. He has no rales. He exhibits no tenderness.  Abdominal: Soft. Bowel sounds are normal. He exhibits no distension and no mass. There is no hepatosplenomegaly. There is tenderness in the left lower quadrant. There is no rebound and no guarding.  Musculoskeletal: Normal range of motion. He exhibits no edema.  Lymphadenopathy:    He has no cervical adenopathy.  Neurological: He is alert and oriented to person, place, and time. No cranial nerve deficit. Coordination normal.  Skin: Skin is warm and dry. No rash noted. He is not diaphoretic. No erythema. No pallor.  Psychiatric: He has a normal mood and affect. His behavior is normal. Judgment and thought content normal.          Assessment &  Plan:   Problem List Items Addressed This Visit      Unprioritized   Diverticulitis - Primary    Reviewed recent ER evaluation for diverticulitis. Symptomatically doing well. Mild TTP LLQ on exam. Will recheck CBC and CMP today.      Relevant Orders   Comprehensive metabolic panel   CBC with Differential/Platelet   Essential hypertension, benign    BP Readings from Last 3 Encounters:  12/19/15 102/52  12/01/15 158/99  11/27/15 146/92   BP low today. We discussed potentially decreasing the Amlodipine to 2.5mg  daily. Will monitor for now. Renal function with labs.          Return in about 3 months (around 03/20/2016) for Recheck.  Ronette Deter, MD Internal Medicine [facility]

## 2015-12-19 NOTE — Progress Notes (Signed)
Pre visit review using our clinic review tool, if applicable. No additional management support is needed unless otherwise documented below in the visit note. 

## 2015-12-19 NOTE — Telephone Encounter (Signed)
-----   Message from Jackolyn Confer, MD sent at 12/19/2015  2:52 PM EDT ----- Labs show improved anemia. Labs however show a decline in kidney function and very low sodium levels. I would like to repeat labs tonight to be sure this is a correct reading.

## 2015-12-19 NOTE — Telephone Encounter (Signed)
Spoke to patient. Gave results and instructions. Patient was across the street. Will come into the office to have labs repeated before 5pm.

## 2015-12-19 NOTE — Patient Instructions (Signed)
Labs today.   Follow up in 3 months.  

## 2015-12-23 ENCOUNTER — Ambulatory Visit: Payer: BC Managed Care – PPO | Attending: Pain Medicine | Admitting: Pain Medicine

## 2015-12-23 ENCOUNTER — Encounter: Payer: Self-pay | Admitting: Pain Medicine

## 2015-12-23 VITALS — BP 144/84 | HR 105 | Temp 98.3°F | Resp 16 | Ht 68.0 in | Wt 170.0 lb

## 2015-12-23 DIAGNOSIS — M2578 Osteophyte, vertebrae: Secondary | ICD-10-CM | POA: Insufficient documentation

## 2015-12-23 DIAGNOSIS — M5136 Other intervertebral disc degeneration, lumbar region: Secondary | ICD-10-CM

## 2015-12-23 DIAGNOSIS — M533 Sacrococcygeal disorders, not elsewhere classified: Secondary | ICD-10-CM

## 2015-12-23 DIAGNOSIS — S2242XA Multiple fractures of ribs, left side, initial encounter for closed fracture: Secondary | ICD-10-CM

## 2015-12-23 DIAGNOSIS — M546 Pain in thoracic spine: Secondary | ICD-10-CM | POA: Diagnosis present

## 2015-12-23 DIAGNOSIS — M792 Neuralgia and neuritis, unspecified: Secondary | ICD-10-CM | POA: Diagnosis not present

## 2015-12-23 DIAGNOSIS — M47816 Spondylosis without myelopathy or radiculopathy, lumbar region: Secondary | ICD-10-CM

## 2015-12-23 DIAGNOSIS — Q761 Klippel-Feil syndrome: Secondary | ICD-10-CM

## 2015-12-23 DIAGNOSIS — M542 Cervicalgia: Secondary | ICD-10-CM | POA: Diagnosis present

## 2015-12-23 DIAGNOSIS — M62830 Muscle spasm of back: Secondary | ICD-10-CM | POA: Diagnosis not present

## 2015-12-23 DIAGNOSIS — M79606 Pain in leg, unspecified: Secondary | ICD-10-CM | POA: Diagnosis present

## 2015-12-23 DIAGNOSIS — M5416 Radiculopathy, lumbar region: Secondary | ICD-10-CM

## 2015-12-23 DIAGNOSIS — M5116 Intervertebral disc disorders with radiculopathy, lumbar region: Secondary | ICD-10-CM | POA: Insufficient documentation

## 2015-12-23 DIAGNOSIS — Z8781 Personal history of (healed) traumatic fracture: Secondary | ICD-10-CM | POA: Diagnosis not present

## 2015-12-23 MED ORDER — OXYCODONE-ACETAMINOPHEN 5-325 MG PO TABS: ORAL_TABLET | ORAL | Status: DC

## 2015-12-23 NOTE — Progress Notes (Signed)
Safety precautions to be maintained throughout the outpatient stay will include: orient to surroundings, keep bed in low position, maintain call bell within reach at all times, provide assistance with transfer out of bed and ambulation.  

## 2015-12-23 NOTE — Patient Instructions (Addendum)
PLAN   Continue present medications oxycodone acetaminophen  Lumbar facet, medial branch nerve, blocks to be performed at time return appointment  F/U PCP Dr Ronette Deter  for evaliation of  BP and general medical  condition as previously discussed  F/U surgical evaluation. May consider pending follow-up evaluations  F/U neurological evaluation. May consider pending follow-up evaluations  Ask Angie and secretaries if you have been approved for radiofrequency rhizolysis lumbar facets medial branch nerves  Psych evaluation as discussed. Follow-up with psych evaluation as scheduled with Dr.Perrin   May consider radiofrequency rhizolysis or intraspinal procedures pending response to present treatment and F/U evaluation   Patient to call Pain Management Center should patient have concerns prior to scheduled return appointmentFacet Joint Block The facet joints connect the bones of the spine (vertebrae). They make it possible for you to bend, twist, and make other movements with your spine. They also prevent you from overbending, overtwisting, and making other excessive movements.  A facet joint block is a procedure where a numbing medicine (anesthetic) is injected into a facet joint. Often, a type of anti-inflammatory medicine called a steroid is also injected. A facet joint block may be done for two reasons:   Diagnosis. A facet joint block may be done as a test to see whether neck or back pain is caused by a worn-down or infected facet joint. If the pain gets better after a facet joint block, it means the pain is probably coming from the facet joint. If the pain does not get better, it means the pain is probably not coming from the facet joint.   Therapy. A facet joint block may be done to relieve neck or back pain caused by a facet joint. A facet joint block is only done as a therapy if the pain does not improve with medicine, exercise programs, physical therapy, and other forms of pain  management. LET [facility] CARE PROVIDER KNOW ABOUT:   Any allergies you have.   All medicines you are taking, including vitamins, herbs, eyedrops, and over-the-counter medicines and creams.   Previous problems you or members of your family have had with the use of anesthetics.   Any blood disorders you have had.   Other health problems you have. RISKS AND COMPLICATIONS Generally, having a facet joint block is safe. However, as with any procedure, complications can occur. Possible complications associated with having a facet joint block include:   Bleeding.   Injury to a nerve near the injection site.   Pain at the injection site.   Weakness or numbness in areas controlled by nerves near the injection site.   Infection.   Temporary fluid retention.   Allergic reaction to anesthetics or medicines used during the procedure. BEFORE THE PROCEDURE   Follow your health care provider's instructions if you are taking dietary supplements or medicines. You may need to stop taking them or reduce your dosage.   Do not take any new dietary supplements or medicines without asking your health care provider first.   Follow your health care provider's instructions about eating and drinking before the procedure. You may need to stop eating and drinking several hours before the procedure.   Arrange to have an adult drive you home after the procedure. PROCEDURE  You may need to remove your clothing and dress in an open-back gown so that your health care provider can access your spine.   The procedure will be done while you are lying on an X-ray table. Most of  the time you will be asked to lie on your stomach, but you may be asked to lie in a different position if an injection will be made in your neck.   Special machines will be used to monitor your oxygen levels, heart rate, and blood pressure.   If an injection will be made in your neck, an intravenous (IV) tube will be  inserted into one of your veins. Fluids and medicine will flow directly into your body through the IV tube.   The area over the facet joint where the injection will be made will be cleaned with an antiseptic soap. The surrounding skin will be covered with sterile drapes.   An anesthetic will be applied to your skin to make the injection area numb. You may feel a temporary stinging or burning sensation.   A video X-ray machine will be used to locate the joint. A contrast dye may be injected into the facet joint area to help with locating the joint.   When the joint is located, an anesthetic medicine will be injected into the joint through the needle.   Your health care provider will ask you whether you feel pain relief. If you do feel relief, a steroid may be injected to provide pain relief for a longer period of time. If you do not feel relief or feel only partial relief, additional injections of an anesthetic may be made in other facet joints.   The needle will be removed, the skin will be cleansed, and bandages will be applied.  AFTER THE PROCEDURE   You will be observed for 15-30 minutes before being allowed to go home. Do not drive. Have an adult drive you or take a taxi or public transportation instead.   If you feel pain relief, the pain will return in several hours or days when the anesthetic wears off.   You may feel pain relief 2-14 days after the procedure. The amount of time this relief lasts varies from person to person.   It is normal to feel some tenderness over the injected area(s) for 2 days following the procedure.   If you have diabetes, you may have a temporary increase in blood sugar.   This information is not intended to replace advice given to you by your health care provider. Make sure you discuss any questions you have with your health care provider.   Document Released: 12/15/2006 Document Revised: 08/16/2014 Document Reviewed: 05/15/2012 Elsevier  Interactive Patient Education 2016 [place]  What are the risk, side effects and possible complications? Generally speaking, most procedures are safe.  However, with any procedure there are risks, side effects, and the possibility of complications.  The risks and complications are dependent upon the sites that are lesioned, or the type of nerve block to be performed.  The closer the procedure is to the spine, the more serious the risks are.  Great care is taken when placing the radio frequency needles, block needles or lesioning probes, but sometimes complications can occur.  Infection: Any time there is an injection through the skin, there is a risk of infection.  This is why sterile conditions are used for these blocks.  There are four possible types of infection.  Localized skin infection.  Central Nervous System Infection-This can be in the form of Meningitis, which can be deadly.  Epidural Infections-This can be in the form of an epidural abscess, which can cause pressure inside of the spine, causing compression of the  spinal cord with subsequent paralysis. This would require an emergency surgery to decompress, and there are no guarantees that the patient would recover from the paralysis.  Discitis-This is an infection of the intervertebral discs.  It occurs in about 1% of discography procedures.  It is difficult to treat and it may lead to surgery.        2. Pain: the needles have to go through skin and soft tissues, will cause soreness.       3. Damage to internal structures:  The nerves to be lesioned may be near blood vessels or    other nerves which can be potentially damaged.       4. Bleeding: Bleeding is more common if the patient is taking blood thinners such as  aspirin, Coumadin, Ticiid, Plavix, etc., or if he/she have some genetic predisposition  such as hemophilia. Bleeding into the spinal canal can cause compression of the spinal  cord with  subsequent paralysis.  This would require an emergency surgery to  decompress and there are no guarantees that the patient would recover from the  paralysis.       5. Pneumothorax:  Puncturing of a lung is a possibility, every time a needle is introduced in  the area of the chest or upper back.  Pneumothorax refers to free air around the  collapsed lung(s), inside of the thoracic cavity (chest cavity).  Another two possible  complications related to a similar event would include: Hemothorax and Chylothorax.   These are variations of the Pneumothorax, where instead of air around the collapsed  lung(s), you may have blood or chyle, respectively.       6. Spinal headaches: They may occur with any procedures in the area of the spine.       7. Persistent CSF (Cerebro-Spinal Fluid) leakage: This is a rare problem, but may occur  with prolonged intrathecal or epidural catheters either due to the formation of a fistulous  track or a dural tear.       8. Nerve damage: By working so close to the spinal cord, there is always a possibility of  nerve damage, which could be as serious as a permanent spinal cord injury with  paralysis.       9. Death:  Although rare, severe deadly allergic reactions known as "Anaphylactic  reaction" can occur to any of the medications used.      10. Worsening of the symptoms:  We can always make thing worse.  What are the chances of something like this happening? Chances of any of this occuring are extremely low.  By statistics, you have more of a chance of getting killed in a motor vehicle accident: while driving to the hospital than any of the above occurring .  Nevertheless, you should be aware that they are possibilities.  In general, it is similar to taking a shower.  Everybody knows that you can slip, hit your head and get killed.  Does that mean that you should not shower again?  Nevertheless always keep in mind that statistics do not mean anything if you happen to be on the wrong  side of them.  Even if a procedure has a 1 (one) in a 1,000,000 (million) chance of going wrong, it you happen to be that one..Also, keep in mind that by statistics, you have more of a chance of having something go wrong when taking medications.  Who should not have this procedure? If you are on a blood thinning  medication (e.g. Coumadin, Plavix, see list of "Blood Thinners"), or if you have an active infection going on, you should not have the procedure.  If you are taking any blood thinners, please inform your physician.  How should I prepare for this procedure?  Do not eat or drink anything at least six hours prior to the procedure.  Bring a driver with you .  It cannot be a taxi.  Come accompanied by an adult that can drive you back, and that is strong enough to help you if your legs get weak or numb from the local anesthetic.  Take all of your medicines the morning of the procedure with just enough water to swallow them.  If you have diabetes, make sure that you are scheduled to have your procedure done first thing in the morning, whenever possible.  If you have diabetes, take only half of your insulin dose and notify our nurse that you have done so as soon as you arrive at the clinic.  If you are diabetic, but only take blood sugar pills (oral hypoglycemic), then do not take them on the morning of your procedure.  You may take them after you have had the procedure.  Do not take aspirin or any aspirin-containing medications, at least eleven (11) days prior to the procedure.  They may prolong bleeding.  Wear loose fitting clothing that may be easy to take off and that you would not mind if it got stained with Betadine or blood.  Do not wear any jewelry or perfume  Remove any nail coloring.  It will interfere with some of our monitoring equipment.  NOTE: Remember that this is not meant to be interpreted as a complete list of all possible complications.  Unforeseen problems may  occur.  BLOOD THINNERS The following drugs contain aspirin or other products, which can cause increased bleeding during surgery and should not be taken for 2 weeks prior to and 1 week after surgery.  If you should need take something for relief of minor pain, you may take acetaminophen which is found in Tylenol,m Datril, Anacin-3 and Panadol. It is not blood thinner. The products listed below are.  Do not take any of the products listed below in addition to any listed on your instruction sheet.  A.P.C or A.P.C with Codeine Codeine Phosphate Capsules #3 Ibuprofen Ridaura  ABC compound Congesprin Imuran rimadil  Advil Cope Indocin Robaxisal  Alka-Seltzer Effervescent Pain Reliever and Antacid Coricidin or Coricidin-D  Indomethacin Rufen  Alka-Seltzer plus Cold Medicine Cosprin Ketoprofen S-A-C Tablets  Anacin Analgesic Tablets or Capsules Coumadin Korlgesic Salflex  Anacin Extra Strength Analgesic tablets or capsules CP-2 Tablets Lanoril Salicylate  Anaprox Cuprimine Capsules Levenox Salocol  Anexsia-D Dalteparin Magan Salsalate  Anodynos Darvon compound Magnesium Salicylate Sine-off  Ansaid Dasin Capsules Magsal Sodium Salicylate  Anturane Depen Capsules Marnal Soma  APF Arthritis pain formula Dewitt's Pills Measurin Stanback  Argesic Dia-Gesic Meclofenamic Sulfinpyrazone  Arthritis Bayer Timed Release Aspirin Diclofenac Meclomen Sulindac  Arthritis pain formula Anacin Dicumarol Medipren Supac  Analgesic (Safety coated) Arthralgen Diffunasal Mefanamic Suprofen  Arthritis Strength Bufferin Dihydrocodeine Mepro Compound Suprol  Arthropan liquid Dopirydamole Methcarbomol with Aspirin Synalgos  ASA tablets/Enseals Disalcid Micrainin Tagament  Ascriptin Doan's Midol Talwin  Ascriptin A/D Dolene Mobidin Tanderil  Ascriptin Extra Strength Dolobid Moblgesic Ticlid  Ascriptin with Codeine Doloprin or Doloprin with Codeine Momentum Tolectin  Asperbuf Duoprin Mono-gesic Trendar  Aspergum Duradyne  Motrin or Motrin IB Triminicin  Aspirin plain, buffered or enteric coated Durasal  Myochrisine Trigesic  Aspirin Suppositories Easprin Nalfon Trillsate  Aspirin with Codeine Ecotrin Regular or Extra Strength Naprosyn Uracel  Atromid-S Efficin Naproxen Ursinus  Auranofin Capsules Elmiron Neocylate Vanquish  Axotal Emagrin Norgesic Verin  Azathioprine Empirin or Empirin with Codeine Normiflo Vitamin E  Azolid Emprazil Nuprin Voltaren  Bayer Aspirin plain, buffered or children's or timed BC Tablets or powders Encaprin Orgaran Warfarin Sodium  Buff-a-Comp Enoxaparin Orudis Zorpin  Buff-a-Comp with Codeine Equegesic Os-Cal-Gesic   Buffaprin Excedrin plain, buffered or Extra Strength Oxalid   Bufferin Arthritis Strength Feldene Oxphenbutazone   Bufferin plain or Extra Strength Feldene Capsules Oxycodone with Aspirin   Bufferin with Codeine Fenoprofen Fenoprofen Pabalate or Pabalate-SF   Buffets II Flogesic Panagesic   Buffinol plain or Extra Strength Florinal or Florinal with Codeine Panwarfarin   Buf-Tabs Flurbiprofen Penicillamine   Butalbital Compound Four-way cold tablets Penicillin   Butazolidin Fragmin Pepto-Bismol   Carbenicillin Geminisyn Percodan   Carna Arthritis Reliever Geopen Persantine   Carprofen Gold's salt Persistin   Chloramphenicol Goody's Phenylbutazone   Chloromycetin Haltrain Piroxlcam   Clmetidine heparin Plaquenil   Cllnoril Hyco-pap Ponstel   Clofibrate Hydroxy chloroquine Propoxyphen         Before stopping any of these medications, be sure to consult the physician who ordered them.  Some, such as Coumadin (Warfarin) are ordered to prevent or treat serious conditions such as "deep thrombosis", "pumonary embolisms", and other heart problems.  The amount of time that you may need off of the medication may also vary with the medication and the reason for which you were taking it.  If you are taking any of these medications, please make sure you notify your pain  physician before you undergo any procedures.

## 2015-12-23 NOTE — Progress Notes (Signed)
   Subjective:    Patient ID: Justin Patel, male    DOB: 19-Sep-1950, 65 y.o.   MRN: [id number]  HPI  The patient is a 65 year old gentleman who returns to pain management for further evaluation and treatment of pain involving the neck entire back upper and lower extremity regions. The patient is status post radial freaks rhizolysis lumbar facet, medial branch nerves,. The patient has had some exacerbation of lower back lower extremity pain which appears to be due to the radiofrequency lesion. We discussed patient's condition and patient wished to proceed with interventional treatment at time return appointment in attempt to decrease severity of symptoms,. We will proceed with lumbar facet, medial branch nerve blocks to be performed at time return appointment and will consider patient for additional modifications of treatment regimen pending response to treatment and follow-up evaluation. The patient states that there has been significant improvement of his pain with treatment in pain management Center and wishes to undergo procedure at the present time to decrease severity of symptoms and minimize progression of symptoms. We will proceed with lumbar facet, medial branch nerve, blocks at time return appointment and will continue oxycodone as prescribed. All agreed to suggested treatment plan  Review of Systems     Objective:   Physical Exam   there was tenderness to palpation of the paraspinal musculature region cervical region cervical facet region a moderate degree with moderate tenderness of the splenius capitis and occipitalis musculature regions. Palpation over the region of the nuchal facet cervical paraspinal musculature region was attends to palpation of moderate degree with moderate tenderness over the region of the thoracic facet thoracic paraspinal musculature region. Palpation of the acromioclavicular and glenohumeral joint regions reproduces minimal discomfort. The patient appeared to be  with bilaterally equal grip strength. Palpation of the lumbar paraspinal must reason lumbar facet region was with moderate tends to palpation with moderate tenderness of the PSIS and PII S regions as well lateral bending rotation extension and palpation of the lumbar facets reproduce moderate discomfort. Straight leg raise was tolerates approximately 30 without increased pain with dorsiflexion noted. There was well-healed scar of the knee without increased warmth erythema in the region of the knee. EHL strength appeared to be slightly decreased. There was negative clonus negative Homans. Abdomen nontender with no costovertebral tenderness noted.       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 Lateral recess narrowing at L3-4 through L4-5  Lumbar facet syndrome  Lumbar radiculopathy  Sacroiliac joint dysfunction   history of fractured ribs with severe thoracic spasm and component of intercostal neuralgia       PLAN   Continue present medications oxycodone acetaminophen  Lumbar facet, medial branch nerve, blocks to be performed at time return appointment  F/U PCP Dr Ronette Deter  for evaliation of  BP and general medical  condition as previously discussed  F/U surgical evaluation. May consider pending follow-up evaluations  F/U neurological evaluation. May consider pending follow-up evaluations  Psych evaluation as discussed. Follow-up with psych evaluation as scheduled with Dr.Perrin   May consider radiofrequency rhizolysis or intraspinal procedures pending response to present treatment and F/U evaluation   Patient to call Pain Management Center should patient have concerns prior to scheduled return appointment

## 2015-12-25 ENCOUNTER — Telehealth: Payer: Self-pay | Admitting: *Deleted

## 2015-12-25 ENCOUNTER — Other Ambulatory Visit (INDEPENDENT_AMBULATORY_CARE_PROVIDER_SITE_OTHER): Payer: BC Managed Care – PPO

## 2015-12-25 DIAGNOSIS — E871 Hypo-osmolality and hyponatremia: Secondary | ICD-10-CM | POA: Diagnosis not present

## 2015-12-25 LAB — BASIC METABOLIC PANEL
BUN: 28 mg/dL — ABNORMAL HIGH (ref 6–23)
CALCIUM: 9.3 mg/dL (ref 8.4–10.5)
CO2: 28 meq/L (ref 19–32)
CREATININE: 0.98 mg/dL (ref 0.40–1.50)
Chloride: 105 mEq/L (ref 96–112)
GFR: 81.62 mL/min (ref >60.00)
Glucose, Bld: 118 mg/dL — ABNORMAL HIGH (ref 70–99)
Potassium: 4.7 mEq/L (ref 3.5–5.1)
SODIUM: 138 meq/L (ref 135–145)

## 2015-12-25 NOTE — Telephone Encounter (Signed)
BMP for hyponatremia 

## 2015-12-25 NOTE — Telephone Encounter (Signed)
Labs and dx?  

## 2015-12-26 ENCOUNTER — Telehealth: Payer: Self-pay

## 2015-12-26 NOTE — Telephone Encounter (Signed)
-----   Message from Jackolyn Confer, MD sent at 12/26/2015  7:14 AM EDT ----- Electrolytes were normal and kidney function was back to normal. We will continue to monitor with repeat labs at next visit.

## 2015-12-26 NOTE — Telephone Encounter (Signed)
Patient aware of results and recommendations. °

## 2015-12-29 ENCOUNTER — Encounter: Payer: Self-pay | Admitting: Pain Medicine

## 2015-12-29 ENCOUNTER — Ambulatory Visit: Payer: BC Managed Care – PPO | Attending: Pain Medicine | Admitting: Pain Medicine

## 2015-12-29 VITALS — BP 121/90 | HR 75 | Temp 98.2°F | Resp 12 | Ht 68.0 in | Wt 180.0 lb

## 2015-12-29 DIAGNOSIS — S2242XA Multiple fractures of ribs, left side, initial encounter for closed fracture: Secondary | ICD-10-CM

## 2015-12-29 DIAGNOSIS — M545 Low back pain: Secondary | ICD-10-CM | POA: Diagnosis present

## 2015-12-29 DIAGNOSIS — M47816 Spondylosis without myelopathy or radiculopathy, lumbar region: Secondary | ICD-10-CM

## 2015-12-29 DIAGNOSIS — M79606 Pain in leg, unspecified: Secondary | ICD-10-CM | POA: Diagnosis present

## 2015-12-29 DIAGNOSIS — M2578 Osteophyte, vertebrae: Secondary | ICD-10-CM | POA: Insufficient documentation

## 2015-12-29 DIAGNOSIS — M5136 Other intervertebral disc degeneration, lumbar region: Secondary | ICD-10-CM | POA: Insufficient documentation

## 2015-12-29 DIAGNOSIS — Q761 Klippel-Feil syndrome: Secondary | ICD-10-CM

## 2015-12-29 DIAGNOSIS — M5416 Radiculopathy, lumbar region: Secondary | ICD-10-CM

## 2015-12-29 DIAGNOSIS — M51369 Other intervertebral disc degeneration, lumbar region without mention of lumbar back pain or lower extremity pain: Secondary | ICD-10-CM

## 2015-12-29 MED ORDER — CEFUROXIME AXETIL 250 MG PO TABS: 250.0000 mg | ORAL_TABLET | Freq: Two times a day (BID) | ORAL | Status: DC

## 2015-12-29 MED ORDER — ORPHENADRINE CITRATE 30 MG/ML IJ SOLN
60.0000 mg | Freq: Once | INTRAMUSCULAR | Status: AC
  Administered 2015-12-29: 60 mg via INTRAMUSCULAR
  Filled 2015-12-29: qty 2

## 2015-12-29 MED ORDER — MIDAZOLAM HCL 5 MG/5ML IJ SOLN
5.0000 mg | Freq: Once | INTRAMUSCULAR | Status: AC
  Administered 2015-12-29: 5 mg via INTRAVENOUS
  Filled 2015-12-29: qty 5

## 2015-12-29 MED ORDER — CEFAZOLIN SODIUM 1-5 GM-% IV SOLN
1.0000 g | Freq: Once | INTRAVENOUS | Status: AC
  Administered 2015-12-29: 1 g via INTRAVENOUS

## 2015-12-29 MED ORDER — LACTATED RINGERS IV SOLN: 1000.0000 mL | INTRAVENOUS | Status: DC

## 2015-12-29 MED ORDER — CEFAZOLIN SODIUM 1 G IJ SOLR
INTRAMUSCULAR | Status: AC
  Administered 2015-12-29: 10:00:00
  Filled 2015-12-29: qty 10

## 2015-12-29 MED ORDER — BUPIVACAINE HCL (PF) 0.25 % IJ SOLN
30.0000 mL | Freq: Once | INTRAMUSCULAR | Status: AC
  Administered 2015-12-29: 30 mL
  Filled 2015-12-29: qty 30

## 2015-12-29 MED ORDER — FENTANYL CITRATE (PF) 100 MCG/2ML IJ SOLN
100.0000 ug | Freq: Once | INTRAMUSCULAR | Status: AC
  Administered 2015-12-29: 100 ug via INTRAVENOUS
  Filled 2015-12-29: qty 2

## 2015-12-29 MED ORDER — TRIAMCINOLONE ACETONIDE 40 MG/ML IJ SUSP
40.0000 mg | Freq: Once | INTRAMUSCULAR | Status: AC
  Administered 2015-12-29: 40 mg
  Filled 2015-12-29: qty 1

## 2015-12-29 NOTE — Patient Instructions (Addendum)
PLAN   Continue present medications oxycodone acetaminophen. Please obtain Ceftin antibiotic today and begin taking Ceftin antibiotic today as prescribed   F/U PCP Dr Ronette Deter  for evaliation of  BP and general medical  condition  F/U surgical evaluation. May consider pending follow-up evaluations  F/U neurological evaluation. May consider PNCV/EMG studies and other studies pending follow-up evaluations  May consider radiofrequency rhizolysis or intraspinal procedures pending response to present treatment and F/U evaluation   Patient to call Pain Management Center should patient have concerns prior to scheduled return appointment  Pain Management Discharge Instructions  General Discharge Instructions :  If you need to reach your doctor call: Monday-Friday 8:00 am - 4:00 pm at [phone] or toll free [phone].  After clinic hours [phone] to have operator reach doctor.  Bring all of your medication bottles to all your appointments in the pain clinic.  To cancel or reschedule your appointment with Pain Management please remember to call 24 hours in advance to avoid a fee.  Refer to the educational materials which you have been given on: General Risks, I had my Procedure. Discharge Instructions, Post Sedation.  Post Procedure Instructions:  The drugs you were given will stay in your system until tomorrow, so for the next 24 hours you should not drive, make any legal decisions or drink any alcoholic beverages.  You may eat anything you prefer, but it is better to start with liquids then soups and crackers, and gradually work up to solid foods.  Please notify your doctor immediately if you have any unusual bleeding, trouble breathing or pain that is not related to your normal pain.  Depending on the type of procedure that was done, some parts of your body may feel week and/or numb.  This usually clears up by tonight or the next day.  Walk with the use of an assistive  device or accompanied by an adult for the 24 hours.  You may use ice on the affected area for the first 24 hours.  Put ice in a Ziploc bag and cover with a towel and place against area 15 minutes on 15 minutes off.  You may switch to heat after 24 hours.  A prescription for CEFTIN was sent to your pharmacy and should be available for pickup today.

## 2015-12-29 NOTE — Progress Notes (Signed)
Subjective:    Patient ID: Justin Patel, male    DOB: 12-Apr-1951, 65 y.o.   MRN: [id number]  HPI  PROCEDURE PERFORMED: Lumbar facet (medial branch block)   NOTE: The patient is a 65 y.o. male who returns to [facility] for further evaluation and treatment of pain involving the lumbar and lower extremity region. 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 Lateral recess narrowing at L3-4 through L4-5. There is concern regarding significant component of patient's pain being due to lumbar facet syndrome. The risks, benefits, and expectations of the procedure have been discussed and explained to the patient who was understanding and in agreement with suggested treatment plan. We will proceed with interventional treatment as discussed and as explained to the patient who was understanding and wished to proceed with procedure as planned.   DESCRIPTION OF PROCEDURE: Lumbar facet (medial branch block) with IV Versed, IV fentanyl conscious sedation, EKG, blood pressure, pulse, capnography, and pulse oximetry monitoring. The procedure was performed with the patient in the prone position. Betadine prep of proposed entry site performed.   NEEDLE PLACEMENT AT: Left L 2 lumbar facet (medial branch block). Under fluoroscopic guidance with oblique orientation of 15 degrees, a 22-gauge needle was inserted at the L 2 vertebral body level with needle placed at the targeted area of Burton's Eye or Eye of the Scotty Dog with documentation of needle placement in the superior and lateral border of targeted area of Burton's Eye or Eye of the Scotty Dog with oblique orientation of 15 degrees. Following documentation of needle placement at the L 2 vertebral body level, needle placement was then accomplished at the L 3 vertebral body level.   NEEDLE PLACEMENT AT L3, L4, and L5 VERTEBRAL BODY LEVELS ON THE LEFT  SIDE The procedure was performed at the L3, L4, and L5 vertebral body levels exactly as was performed at the L 2 vertebral body level utilizing the same technique and under fluoroscopic guidance.  NEEDLE PLACEMENT AT THE SACRAL ALA with AP view of the lumbosacral spine. With the patient in the prone position, Betadine prep of proposed entry site accomplished, a 22 gauge needle was inserted in the region of the sacral ala (groove formed by the superior articulating process of S1 and the sacral wing). Following documentation of needle placement at the sacral ala,    Needle placement was then verified at all levels on lateral view. Following documentation of needle placement at all levels on lateral view and following negative aspiration for heme and CSF, each level was injected with 1 mL of 0.25% bupivacaine with Kenalog.     LUMBAR FACET, MEDIAL BRANCH NERVE, BLOCKS PERFORMED ON THE RIGHT SIDE   The procedure was performed on the right side exactly as was performed on the left side at the same levels and utilizing the same technique under fluoroscopic guidance.     The patient tolerated the procedure well. A total of 40 mg of Kenalog was utilized for the procedure.   PLAN:  1. Medications: The patient will continue presently prescribed medication oxycodone 2. May consider modification of treatment regimen at time of return appointment pending response to treatment rendered on today's visit. 3. The patient is to follow-up with primary care physician Dr. Ronette Deter for further evaluation of blood pressure and general medical condition status post steroid injection performed on today's visit. 4. Surgical follow-up evaluation. Has been addressed 5.  Neurological follow-up evaluation. May consider PNCV EMG studies and other studies 6. The patient may be candidate for radiofrequency procedures, implantation type procedures, and other treatment pending response to treatment and follow-up  evaluation. 7. The patient has been advised to call the [facility] prior to scheduled return appointment should there be significant change in condition or should patient have other concerns regarding condition prior to scheduled return appointment.  The patient is understanding and in agreement with suggested treatment plan.   Review of Systems     Objective:   Physical Exam        Assessment & Plan:

## 2015-12-29 NOTE — Progress Notes (Signed)
Patient here for procedure d/t lower back pain. Safety precautions to be maintained throughout the outpatient stay will include: orient to surroundings, keep bed in low position, maintain call bell within reach at all times, provide assistance with transfer out of bed and ambulation.  

## 2015-12-30 NOTE — Telephone Encounter (Signed)
No problems 

## 2016-01-07 ENCOUNTER — Other Ambulatory Visit: Payer: Self-pay | Admitting: Internal Medicine

## 2016-01-15 ENCOUNTER — Other Ambulatory Visit: Payer: Self-pay | Admitting: Internal Medicine

## 2016-01-15 NOTE — Telephone Encounter (Signed)
Pt is requesting a refill, last filled on

## 2016-01-15 NOTE — Telephone Encounter (Signed)
Last filled on 11/12/15 #30 + 5, last OV 12/19/15. Okay to refill?

## 2016-01-19 ENCOUNTER — Encounter: Payer: Self-pay | Admitting: Pain Medicine

## 2016-01-19 ENCOUNTER — Ambulatory Visit: Payer: BC Managed Care – PPO | Attending: Pain Medicine | Admitting: Pain Medicine

## 2016-01-19 VITALS — BP 140/92 | HR 73 | Temp 98.5°F | Resp 16 | Ht 68.0 in | Wt 180.0 lb

## 2016-01-19 DIAGNOSIS — M5116 Intervertebral disc disorders with radiculopathy, lumbar region: Secondary | ICD-10-CM | POA: Diagnosis not present

## 2016-01-19 DIAGNOSIS — M5136 Other intervertebral disc degeneration, lumbar region: Secondary | ICD-10-CM

## 2016-01-19 DIAGNOSIS — M542 Cervicalgia: Secondary | ICD-10-CM | POA: Diagnosis present

## 2016-01-19 DIAGNOSIS — M533 Sacrococcygeal disorders, not elsewhere classified: Secondary | ICD-10-CM

## 2016-01-19 DIAGNOSIS — M2578 Osteophyte, vertebrae: Secondary | ICD-10-CM | POA: Insufficient documentation

## 2016-01-19 DIAGNOSIS — G588 Other specified mononeuropathies: Secondary | ICD-10-CM | POA: Diagnosis not present

## 2016-01-19 DIAGNOSIS — M47816 Spondylosis without myelopathy or radiculopathy, lumbar region: Secondary | ICD-10-CM

## 2016-01-19 DIAGNOSIS — Z8781 Personal history of (healed) traumatic fracture: Secondary | ICD-10-CM | POA: Diagnosis not present

## 2016-01-19 DIAGNOSIS — S2242XA Multiple fractures of ribs, left side, initial encounter for closed fracture: Secondary | ICD-10-CM

## 2016-01-19 DIAGNOSIS — M79606 Pain in leg, unspecified: Secondary | ICD-10-CM | POA: Diagnosis present

## 2016-01-19 DIAGNOSIS — Q761 Klippel-Feil syndrome: Secondary | ICD-10-CM

## 2016-01-19 DIAGNOSIS — M545 Low back pain: Secondary | ICD-10-CM | POA: Diagnosis present

## 2016-01-19 DIAGNOSIS — M5416 Radiculopathy, lumbar region: Secondary | ICD-10-CM

## 2016-01-19 MED ORDER — OXYCODONE-ACETAMINOPHEN 5-325 MG PO TABS: ORAL_TABLET | ORAL | Status: DC

## 2016-01-19 NOTE — Progress Notes (Signed)
Safety precautions to be maintained throughout the outpatient stay will include: orient to surroundings, keep bed in low position, maintain call bell within reach at all times, provide assistance with transfer out of bed and ambulation.  

## 2016-01-19 NOTE — Patient Instructions (Signed)
  PLAN   Continue present medications oxycodone acetaminophen  F/U PCP Dr Ronette Deter  for evaliation of  BP and general medical  condition as previously discussed  F/U surgical evaluation. May consider pending follow-up evaluations  F/U neurological evaluation. May consider pending follow-up evaluations  Psych evaluation as discussed. Follow-up with psych evaluation as scheduled with Dr.Perrin   May consider radiofrequency rhizolysis or intraspinal procedures pending response to present treatment and F/U evaluation   Patient to call [facility] should patient have concerns prior to scheduled return appointment

## 2016-01-19 NOTE — Progress Notes (Signed)
   Subjective:    Patient ID: Justin Patel, male    DOB: July 24, 1951, 65 y.o.   MRN: [id number]  HPI  Is a 65 year old gentleman who returns to pain management for further evaluation and treatment of pain involving the neck upper extremity regions as well as the mid lower back and lower extremity regions. The patient has had improvement of his lower back lower extremity pain and is status post radiofrequency rhizolysis lumbar facet, medial branch nerves, we discussed patient's condition at the present time we will continue oxycodone and we'll avoid additional modifications of patient treatment regimen. The patient denies any trauma change in events of daily living the call significant change in symptomatology. We will remain available to consider additional modifications of treatment regimen as discussed and as explained to patient on today's visit who was with understanding and agreement suggested treatment plan.  Review of Systems     Objective:   Physical Exam  There was tenderness to palpation of the paraspinal muscular treat and cervical region cervical facet region a mild degree with mild tenderness of the splenius capitis and occipitalis regions. Palpation of the acromioclavicular and glenohumeral joint regions reproduce mild discomfort and patient was able to perform drop test without significant difficulty. The patient appeared to be with bilaterally equal grip strength with Tinel and Phalen's maneuver reproducing minimal discomfort. There was mild tenderness over the thoracic region thoracic facet region with no crepitus of the thoracic region noted. Palpation over the lumbar paraspinal musculatures and lumbar facet region was associated with mild discomfort with lateral bending rotation extension and palpation of the lumbar facets reproducing mild to moderate discomfort. There was mild tenderness of the PSIS and PII S region. Palpation over the greater trochanteric region iliotibial band  region was without increased pain of significant degree. Straight leg raise was tolerates approximately 20 without increased pain with dorsiflexion noted. There appeared to be negative clonus negative Homans. 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 Lateral recess narrowing at L3-4 through L4-5  Lumbar facet syndrome  Lumbar radiculopathy  Sacroiliac joint dysfunction   history of fractured ribs with severe thoracic spasm and component of intercostal neuralgia     PLAN   Continue present medications oxycodone acetaminophen  F/U PCP Dr Ronette Deter  for evaliation of  BP and general medical  condition as previously discussed  F/U surgical evaluation. May consider pending follow-up evaluations  F/U neurological evaluation. May consider pending follow-up evaluations  Psych evaluation as discussed. Follow-up with psych evaluation as scheduled with Dr.Perrin   May consider additional radiofrequency rhizolysis or intraspinal procedures pending response to present treatment and F/U evaluation   Patient to call [facility] Center should patient have concerns prior to scheduled return appointment

## 2016-02-01 DIAGNOSIS — C801 Malignant (primary) neoplasm, unspecified: Secondary | ICD-10-CM

## 2016-02-01 HISTORY — DX: Malignant (primary) neoplasm, unspecified: C80.1

## 2016-02-04 DIAGNOSIS — D239 Other benign neoplasm of skin, unspecified: Secondary | ICD-10-CM

## 2016-02-04 HISTORY — DX: Other benign neoplasm of skin, unspecified: D23.9

## 2016-02-18 ENCOUNTER — Ambulatory Visit: Payer: Medicare Other | Attending: Pain Medicine | Admitting: Pain Medicine

## 2016-02-18 ENCOUNTER — Encounter: Payer: Self-pay | Admitting: Pain Medicine

## 2016-02-18 VITALS — BP 141/90 | HR 103 | Temp 98.1°F | Resp 16 | Ht 68.0 in | Wt 178.0 lb

## 2016-02-18 DIAGNOSIS — M542 Cervicalgia: Secondary | ICD-10-CM | POA: Diagnosis present

## 2016-02-18 DIAGNOSIS — M533 Sacrococcygeal disorders, not elsewhere classified: Secondary | ICD-10-CM | POA: Insufficient documentation

## 2016-02-18 DIAGNOSIS — M461 Sacroiliitis, not elsewhere classified: Secondary | ICD-10-CM | POA: Diagnosis not present

## 2016-02-18 DIAGNOSIS — M549 Dorsalgia, unspecified: Secondary | ICD-10-CM | POA: Diagnosis present

## 2016-02-18 DIAGNOSIS — Z8781 Personal history of (healed) traumatic fracture: Secondary | ICD-10-CM | POA: Insufficient documentation

## 2016-02-18 DIAGNOSIS — Q761 Klippel-Feil syndrome: Secondary | ICD-10-CM

## 2016-02-18 DIAGNOSIS — S2242XA Multiple fractures of ribs, left side, initial encounter for closed fracture: Secondary | ICD-10-CM

## 2016-02-18 DIAGNOSIS — M5416 Radiculopathy, lumbar region: Secondary | ICD-10-CM

## 2016-02-18 DIAGNOSIS — M5116 Intervertebral disc disorders with radiculopathy, lumbar region: Secondary | ICD-10-CM | POA: Diagnosis not present

## 2016-02-18 DIAGNOSIS — C439 Malignant melanoma of skin, unspecified: Secondary | ICD-10-CM | POA: Diagnosis not present

## 2016-02-18 DIAGNOSIS — G588 Other specified mononeuropathies: Secondary | ICD-10-CM | POA: Insufficient documentation

## 2016-02-18 DIAGNOSIS — M2578 Osteophyte, vertebrae: Secondary | ICD-10-CM | POA: Insufficient documentation

## 2016-02-18 DIAGNOSIS — M5136 Other intervertebral disc degeneration, lumbar region: Secondary | ICD-10-CM

## 2016-02-18 DIAGNOSIS — M62830 Muscle spasm of back: Secondary | ICD-10-CM | POA: Insufficient documentation

## 2016-02-18 DIAGNOSIS — M47816 Spondylosis without myelopathy or radiculopathy, lumbar region: Secondary | ICD-10-CM

## 2016-02-18 DIAGNOSIS — M79606 Pain in leg, unspecified: Secondary | ICD-10-CM | POA: Diagnosis present

## 2016-02-18 DIAGNOSIS — M47817 Spondylosis without myelopathy or radiculopathy, lumbosacral region: Secondary | ICD-10-CM | POA: Diagnosis not present

## 2016-02-18 DIAGNOSIS — M791 Myalgia: Secondary | ICD-10-CM | POA: Diagnosis not present

## 2016-02-18 MED ORDER — OXYCODONE-ACETAMINOPHEN 5-325 MG PO TABS: ORAL_TABLET | ORAL | Status: DC

## 2016-02-18 NOTE — Progress Notes (Signed)
   Subjective:    Patient ID: Justin Patel, male    DOB: 03-02-1951, 65 y.o.   MRN: [id number]  HPI  The patient is a 65 year old gentleman who returns to pain management for further evaluation and treatment of pain involving the neck upper extremity regions entire back and lower extremity regions. The patient states that the pain is fairly well-controlled present time. The patient is undergone evaluation by Justin Patel and is with evidence of melanoma following biopsy regions of the posterior thoracic region. The patient will follow-up with Justin Patel as planned. We discussed patient's condition and will continue oxycodone as prescribed at this time and we will avoid interventional treatment. The patient denied any trauma change in events of daily living the call significant change in symptomatology. All agreed with suggested treatment plan    Review of Systems     Objective:   Physical Exam   There was tenderness to palpation of the splenius capitis and occipitalis region palpation which reproduces mild discomfort. There was well-healed surgical scar of the cervical region without increased warmth and erythema in the region of the scar. There was tenderness of the acromioclavicular and glenohumeral joint regions of mild to moderate degree and patient appeared to be with bilaterally equal grip strength with Tinel and Phalen's maneuver reproducing minimal discomfort. Palpation of the thoracic region was a tennis to palpation with 2 bandages of the posterior thoracic region and mid thoracic region. Patient is status post biopsy of the skin with evidence of melanoma. There was tenderness over the lumbar region lumbar paraspinal musculature region with lateral bending rotation extension and palpation of the lumbar facets reproducing moderate discomfort as well as moderate tenderness of the PSIS and PII S region as well as mild tenderness of the greater trochanteric region. Straight leg raise  was tolerates a 20 without an increase of pain with dorsiflexion noted. EHL strength appeared to be slightly decreased with no sensory deficit of dermatomal distribution detected. There was negative clonus negative Homans. 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 Lateral recess narrowing at L3-4 through L4-5  Lumbar facet syndrome  Lumbar radiculopathy  Sacroiliac joint dysfunction   history of fractured ribs with severe thoracic spasm and component of intercostal neuralgia       PLAN   Continue present medications oxycodone acetaminophen  F/U PCP Dr Ronette Patel  for evaliation of  BP and general medical  condition   F/U surgical evaluation. May consider pending follow-up evaluations  F/U neurological evaluation. May consider PNCV/EMG studies and other studies pending follow-up evaluations  Psych evaluation as discussed. Follow-up with psych evaluation as scheduled with Justin Patel   F/U Justin Patel as planned  May consider radiofrequency rhizolysis or intraspinal procedures pending response to present treatment and F/U evaluation   Patient to call [facility] should patient have concerns prior to scheduled return appointment

## 2016-02-18 NOTE — Progress Notes (Signed)
Patient here for medication management Safety precautions to be maintained throughout the outpatient stay will include: orient to surroundings, keep bed in low position, maintain call bell within reach at all times, provide assistance with transfer out of bed and ambulation.  

## 2016-02-18 NOTE — Patient Instructions (Addendum)
  PLAN   Continue present medications oxycodone acetaminophen  F/U PCP Dr Ronette Deter  for evaliation of  BP and general medical  condition   F/U surgical evaluation. May consider pending follow-up evaluations  F/U neurological evaluation. May consider PNCV/EMG studies and other studies pending follow-up evaluations  Psych evaluation as discussed. Follow-up with psych evaluation as scheduled with Dr.Perrin   F/U Dr. Sarina Ser as planned  May consider radiofrequency rhizolysis or intraspinal procedures pending response to present treatment and F/U evaluation   Patient to call [facility] should patient have concerns prior to scheduled return appointment

## 2016-02-19 ENCOUNTER — Other Ambulatory Visit: Payer: Self-pay | Admitting: *Deleted

## 2016-02-19 NOTE — Telephone Encounter (Signed)
Patient has requested medication refill for  tamsulosin  Pharmacy [facility]

## 2016-02-19 NOTE — Telephone Encounter (Signed)
Please advise you have not filled this in the past. thanks

## 2016-02-20 MED ORDER — TAMSULOSIN HCL 0.4 MG PO CAPS: 0.4000 mg | ORAL_CAPSULE | Freq: Every day | ORAL | Status: DC

## 2016-02-26 ENCOUNTER — Other Ambulatory Visit: Payer: Self-pay | Admitting: Internal Medicine

## 2016-03-02 DIAGNOSIS — D485 Neoplasm of uncertain behavior of skin: Secondary | ICD-10-CM | POA: Diagnosis not present

## 2016-03-02 DIAGNOSIS — L089 Local infection of the skin and subcutaneous tissue, unspecified: Secondary | ICD-10-CM | POA: Diagnosis not present

## 2016-03-05 ENCOUNTER — Other Ambulatory Visit: Payer: Self-pay | Admitting: Internal Medicine

## 2016-03-15 ENCOUNTER — Ambulatory Visit: Payer: Medicare Other | Attending: Pain Medicine | Admitting: Pain Medicine

## 2016-03-15 ENCOUNTER — Encounter: Payer: Self-pay | Admitting: Pain Medicine

## 2016-03-15 DIAGNOSIS — Z8781 Personal history of (healed) traumatic fracture: Secondary | ICD-10-CM | POA: Diagnosis not present

## 2016-03-15 DIAGNOSIS — M2578 Osteophyte, vertebrae: Secondary | ICD-10-CM | POA: Insufficient documentation

## 2016-03-15 DIAGNOSIS — M62830 Muscle spasm of back: Secondary | ICD-10-CM | POA: Insufficient documentation

## 2016-03-15 DIAGNOSIS — M5116 Intervertebral disc disorders with radiculopathy, lumbar region: Secondary | ICD-10-CM | POA: Diagnosis not present

## 2016-03-15 DIAGNOSIS — Z85820 Personal history of malignant melanoma of skin: Secondary | ICD-10-CM | POA: Insufficient documentation

## 2016-03-15 DIAGNOSIS — M533 Sacrococcygeal disorders, not elsewhere classified: Secondary | ICD-10-CM | POA: Insufficient documentation

## 2016-03-15 DIAGNOSIS — M47816 Spondylosis without myelopathy or radiculopathy, lumbar region: Secondary | ICD-10-CM

## 2016-03-15 DIAGNOSIS — M5416 Radiculopathy, lumbar region: Secondary | ICD-10-CM | POA: Diagnosis not present

## 2016-03-15 DIAGNOSIS — M47812 Spondylosis without myelopathy or radiculopathy, cervical region: Secondary | ICD-10-CM

## 2016-03-15 DIAGNOSIS — M5030 Other cervical disc degeneration, unspecified cervical region: Secondary | ICD-10-CM | POA: Insufficient documentation

## 2016-03-15 DIAGNOSIS — M542 Cervicalgia: Secondary | ICD-10-CM | POA: Diagnosis present

## 2016-03-15 DIAGNOSIS — M461 Sacroiliitis, not elsewhere classified: Secondary | ICD-10-CM | POA: Diagnosis not present

## 2016-03-15 DIAGNOSIS — M545 Low back pain: Secondary | ICD-10-CM | POA: Diagnosis present

## 2016-03-15 DIAGNOSIS — M47817 Spondylosis without myelopathy or radiculopathy, lumbosacral region: Secondary | ICD-10-CM | POA: Diagnosis not present

## 2016-03-15 DIAGNOSIS — M791 Myalgia: Secondary | ICD-10-CM | POA: Diagnosis not present

## 2016-03-15 DIAGNOSIS — M5136 Other intervertebral disc degeneration, lumbar region: Secondary | ICD-10-CM | POA: Insufficient documentation

## 2016-03-15 MED ORDER — OXYCODONE-ACETAMINOPHEN 5-325 MG PO TABS: ORAL_TABLET | ORAL | 0 refills | Status: DC

## 2016-03-15 NOTE — Progress Notes (Signed)
Patient here for medication management Safety precautions to be maintained throughout the outpatient stay will include: orient to surroundings, keep bed in low position, maintain call bell within reach at all times, provide assistance with transfer out of bed and ambulation.  

## 2016-03-15 NOTE — Patient Instructions (Signed)
  PLAN   Continue present medications oxycodone acetaminophen  F/U PCP Dr Ronette Deter or other PCP  for evaliation of  BP and general medical  condition   F/U surgical evaluation. May consider pending follow-up evaluations  F/U neurological evaluation. May consider PNCV/EMG studies and other studies pending follow-up evaluations  Psych evaluation as discussed. Follow-up with psych evaluation as scheduled with Dr.Perrin   F/U Dr. Sarina Ser as planned  May consider radiofrequency rhizolysis or intraspinal procedures pending response to present treatment and F/U evaluation   Patient to call [facility] Center should patient have concerns prior to scheduled return appointment

## 2016-03-15 NOTE — Progress Notes (Signed)
    The patient is a 65 year old gentleman who returns to pain management for further evaluation and treatment of pain involving the neck upper extremity regions mid back lower back lower extremity regions. The patient is status post dermatological procedure by Dr. Sarina Ser. At the present time we will avoid modification of treatment regimen as well as avoid interventional treatment. The patient will undergo follow-up evaluation with Dr. Nehemiah Massed and we will remain available to consider patient for modification of treatment pending follow-up evaluations. The patient denies any other significant change in events of daily living the call significant change in symptoms pathology. The patient states the pain involving the neck and upper extremity region as well as as well as the lower back and lower extremity region is with interference of activities of daily living and doesn't appear with ability to obtain restful sleep as well. We remain available to consider patient for additional treatment modifications pending follow-up evaluation. At the present time we will await patient to undergo further dermatological evaluation and treatment. All agreed to suggested treatment plan.    Physical examination  There was tenderness of the paraspinal musculature in the cervical and cervical facet region with moderate tenderness of the splenius capitis and occipitalis region. The patient was with tenderness of the acromioclavicular and glenohumeral joint regions a moderate degree. Patient appeared to be with bilaterally equal grip strength without increased pain of with Tinel and Phalen's maneuver. Palpation over the thoracic region reveal patient to be with tensed palpation of moderate degree. There was surgical scar of the mid thoracic region noted. Palpation over the lumbar region was attends to palpation with lateral bending rotation extension and palpation of the lumbar facets reproducing moderate discomfort.  There was moderate tenderness of the PSIS and PII S region as well as the gluteal and piriformis muscle regions. There was moderate increased pain with pressure applied to the ileum with patient in lateral decubitus position. Straight leg raising was tolerates approximately 30. Well-healed surgical scar of the knee without increased warmth erythema of the knee negative clonus negative Homans EHL strength was slightly decreased. No sensory deficit or dermatomal dystrophy detected. 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 Lateral recess narrowing at L3-4 through L4-5  Lumbar facet syndrome  Lumbar radiculopathy  Sacroiliac joint dysfunction  History of fractured ribs with severe thoracic spasm and component of intercostal neuralgia  Degenerative disc disease cervical spine  Cervical facet syndrome  Melanoma  (status post excision)     PLAN   Continue present medications oxycodone acetaminophen  F/U PCP Dr Ronette Deter or other PCP  for evaliation of  BP and general medical  condition   F/U surgical evaluation. May consider pending follow-up evaluations  F/U neurological evaluation. May consider PNCV/EMG studies and other studies pending follow-up evaluations  Psych evaluation as discussed. Follow-up with psych evaluation as scheduled with Dr.Perrin   F/U Dr. Sarina Ser as planned  May consider radiofrequency rhizolysis or intraspinal procedures pending response to present treatment and F/U evaluation   Patient to call [facility] should patient have concerns prior to scheduled return appointment

## 2016-03-23 ENCOUNTER — Ambulatory Visit: Payer: Self-pay | Admitting: Internal Medicine

## 2016-03-24 ENCOUNTER — Other Ambulatory Visit: Payer: Self-pay | Admitting: Cardiovascular Disease

## 2016-03-25 ENCOUNTER — Ambulatory Visit (INDEPENDENT_AMBULATORY_CARE_PROVIDER_SITE_OTHER): Payer: Medicare Other | Admitting: Family Medicine

## 2016-03-25 ENCOUNTER — Encounter: Payer: Self-pay | Admitting: Family Medicine

## 2016-03-25 VITALS — BP 126/84 | HR 88 | Temp 98.7°F | Wt 203.6 lb

## 2016-03-25 DIAGNOSIS — E785 Hyperlipidemia, unspecified: Secondary | ICD-10-CM | POA: Diagnosis not present

## 2016-03-25 DIAGNOSIS — I10 Essential (primary) hypertension: Secondary | ICD-10-CM

## 2016-03-25 DIAGNOSIS — M5136 Other intervertebral disc degeneration, lumbar region: Secondary | ICD-10-CM

## 2016-03-25 LAB — COMPREHENSIVE METABOLIC PANEL
ALT: 47 U/L (ref 0–53)
AST: 23 U/L (ref 0–37)
Albumin: 4.3 g/dL (ref 3.5–5.2)
Alkaline Phosphatase: 59 U/L (ref 39–117)
BUN: 15 mg/dL (ref 6–23)
CHLORIDE: 101 meq/L (ref 96–112)
CO2: 27 meq/L (ref 19–32)
CREATININE: 1 mg/dL (ref 0.40–1.50)
Calcium: 9.8 mg/dL (ref 8.4–10.5)
GFR: 79.67 mL/min (ref >60.00)
Glucose, Bld: 123 mg/dL — ABNORMAL HIGH (ref 70–99)
POTASSIUM: 4.2 meq/L (ref 3.5–5.1)
SODIUM: 135 meq/L (ref 135–145)
Total Bilirubin: 0.4 mg/dL (ref 0.2–1.2)
Total Protein: 7.4 g/dL (ref 6.0–8.3)

## 2016-03-25 LAB — LIPID PANEL
CHOL/HDL RATIO: 3
Cholesterol: 165 mg/dL (ref 0–200)
HDL: 51.6 mg/dL (ref >39.00)
LDL CALC: 90 mg/dL (ref 0–99)
NONHDL: 113.42
Triglycerides: 118 mg/dL (ref 0.0–149.0)
VLDL: 23.6 mg/dL (ref 0.0–40.0)

## 2016-03-25 NOTE — Patient Instructions (Signed)
Nice to see you. We will have you return for fasting lab work. Please continue your current blood pressure and cholesterol medications. Please continue see pain management for your back discomfort. If you develop worsening numbness, or develop weakness, numbness between your legs, loss of bowel or bladder function, fevers, or any new or changing symptoms please seek medical attention.

## 2016-03-25 NOTE — Progress Notes (Signed)
Pre visit review using our clinic review tool, if applicable. No additional management support is needed unless otherwise documented below in the visit note. 

## 2016-03-25 NOTE — Assessment & Plan Note (Signed)
Tolerating Crestor. We'll check a lipid panel and CMP today.

## 2016-03-25 NOTE — Assessment & Plan Note (Signed)
Followed by pain management. Notes this issue is stable. He will continue to follow with pain management and continue his pain medication. Given return precautions.

## 2016-03-25 NOTE — Progress Notes (Signed)
  Justin Rumps, MD Phone: [phone]  Justin Patel is a 65 y.o. male who presents today for f/u.  HYPERTENSION  Disease Monitoring  Home BP Monitoring 120-130/80-90  Chest pain- no    Dyspnea- no Medications  Compliance-  taking amlodipine, losartan, metoprolol. Lightheadedness-  no  Edema- no  HYPERLIPIDEMIA Disease Monitoring: See symptoms for Hypertension Medications: Compliance- taking Crestor Right upper quadrant pain- no  Muscle aches- no new muscle aches  Low back pain: Patient notes chronic low back discomfort. Seeing pain management for this. Has had nerve blocks that were unsuccessful. Does get some sciatica. Occasional numbness in his lower posterior legs. No weakness. No saddle anesthesia, loss of bowel or bladder function, or fevers. Has had multiple MRIs. Does take oxycodone with some benefit.   PMH: nonsmoker. Reports he quit drinking alcohol 8 months ago.   ROS see history of present illness  Objective  Physical Exam Vitals:   03/25/16 1038  BP: 126/84  Pulse: 88  Temp: 98.7 F (37.1 C)    BP Readings from Last 3 Encounters:  03/25/16 126/84  03/15/16 133/77  02/18/16 (!) 141/90   Wt Readings from Last 3 Encounters:  03/25/16 203 lb 9.6 oz (92.4 kg)  03/15/16 180 lb (81.6 kg)  02/18/16 178 lb (80.7 kg)    Physical Exam  Constitutional: No distress.  HENT:  Head: Normocephalic and atraumatic.  Cardiovascular: Normal rate, regular rhythm and normal heart sounds.   Pulmonary/Chest: Effort normal and breath sounds normal.  Musculoskeletal:  Mild diffuse muscular back tenderness, no midline spine tenderness or midline spine step-off  Neurological: He is alert. Gait normal.  5 out of 5 strength bilateral quads, hamstrings, plantar foot, and dorsiflexion, sensation to light touch intact bilaterally extremities  Skin: Skin is warm and dry. He is not diaphoretic.     Assessment/Plan: Please see individual problem list.  Essential  hypertension, benign At goal. Continue current medications.  Hyperlipidemia Tolerating Crestor. We'll check a lipid panel and CMP today.  DDD (degenerative disc disease), lumbar Followed by pain management. Notes this issue is stable. He will continue to follow with pain management and continue his pain medication. Given return precautions.   Orders Placed This Encounter  Procedures  . Lipid Profile    Standing Status:   Future    Number of Occurrences:   1    Standing Expiration Date:   03/25/2017  . Comp Met (CMET)    Standing Status:   Future    Number of Occurrences:   1    Standing Expiration Date:   03/25/2017    Justin Rumps, MD [place]

## 2016-03-25 NOTE — Assessment & Plan Note (Signed)
At goal. Continue current medications. 

## 2016-03-30 ENCOUNTER — Other Ambulatory Visit: Payer: Self-pay | Admitting: Surgical

## 2016-03-30 MED ORDER — LOSARTAN POTASSIUM 100 MG PO TABS: 100.0000 mg | ORAL_TABLET | Freq: Every day | ORAL | 5 refills | Status: DC

## 2016-04-06 ENCOUNTER — Telehealth: Payer: Self-pay | Admitting: *Deleted

## 2016-04-09 ENCOUNTER — Other Ambulatory Visit: Payer: Self-pay

## 2016-04-13 ENCOUNTER — Encounter: Payer: Self-pay | Admitting: Pain Medicine

## 2016-04-14 ENCOUNTER — Other Ambulatory Visit: Payer: Self-pay | Admitting: Cardiovascular Disease

## 2016-04-14 ENCOUNTER — Ambulatory Visit: Payer: Medicare Other | Attending: Pain Medicine | Admitting: Pain Medicine

## 2016-04-14 ENCOUNTER — Encounter: Payer: Self-pay | Admitting: Pain Medicine

## 2016-04-14 VITALS — BP 140/78 | HR 87 | Temp 98.3°F | Resp 16 | Ht 68.0 in | Wt 180.0 lb

## 2016-04-14 DIAGNOSIS — M5416 Radiculopathy, lumbar region: Secondary | ICD-10-CM | POA: Diagnosis not present

## 2016-04-14 DIAGNOSIS — M2578 Osteophyte, vertebrae: Secondary | ICD-10-CM | POA: Diagnosis not present

## 2016-04-14 DIAGNOSIS — M461 Sacroiliitis, not elsewhere classified: Secondary | ICD-10-CM | POA: Diagnosis not present

## 2016-04-14 DIAGNOSIS — M47817 Spondylosis without myelopathy or radiculopathy, lumbosacral region: Secondary | ICD-10-CM | POA: Diagnosis not present

## 2016-04-14 DIAGNOSIS — M533 Sacrococcygeal disorders, not elsewhere classified: Secondary | ICD-10-CM | POA: Diagnosis not present

## 2016-04-14 DIAGNOSIS — Z8781 Personal history of (healed) traumatic fracture: Secondary | ICD-10-CM | POA: Insufficient documentation

## 2016-04-14 DIAGNOSIS — M79606 Pain in leg, unspecified: Secondary | ICD-10-CM | POA: Diagnosis present

## 2016-04-14 DIAGNOSIS — M5136 Other intervertebral disc degeneration, lumbar region: Secondary | ICD-10-CM

## 2016-04-14 DIAGNOSIS — M546 Pain in thoracic spine: Secondary | ICD-10-CM | POA: Diagnosis present

## 2016-04-14 DIAGNOSIS — G588 Other specified mononeuropathies: Secondary | ICD-10-CM | POA: Diagnosis not present

## 2016-04-14 DIAGNOSIS — M542 Cervicalgia: Secondary | ICD-10-CM | POA: Diagnosis present

## 2016-04-14 DIAGNOSIS — M47812 Spondylosis without myelopathy or radiculopathy, cervical region: Secondary | ICD-10-CM

## 2016-04-14 DIAGNOSIS — M5030 Other cervical disc degeneration, unspecified cervical region: Secondary | ICD-10-CM | POA: Diagnosis not present

## 2016-04-14 DIAGNOSIS — M62830 Muscle spasm of back: Secondary | ICD-10-CM | POA: Diagnosis not present

## 2016-04-14 DIAGNOSIS — Z85820 Personal history of malignant melanoma of skin: Secondary | ICD-10-CM | POA: Insufficient documentation

## 2016-04-14 DIAGNOSIS — M5116 Intervertebral disc disorders with radiculopathy, lumbar region: Secondary | ICD-10-CM | POA: Diagnosis not present

## 2016-04-14 DIAGNOSIS — S2241XD Multiple fractures of ribs, right side, subsequent encounter for fracture with routine healing: Secondary | ICD-10-CM

## 2016-04-14 DIAGNOSIS — M791 Myalgia: Secondary | ICD-10-CM | POA: Diagnosis not present

## 2016-04-14 MED ORDER — TRAMADOL HCL 50 MG PO TABS: ORAL_TABLET | ORAL | 0 refills | Status: DC

## 2016-04-14 NOTE — Progress Notes (Signed)
Safety precautions to be maintained throughout the outpatient stay will include: orient to surroundings, keep bed in low position, maintain call bell within reach at all times, provide assistance with transfer out of bed and ambulation.  

## 2016-04-14 NOTE — Progress Notes (Signed)
      The patient is a 65 year old gentleman who returns to pain management for further evaluation and treatment of pain involving the neck entire back upper and lower extremity regions. The patient is with prior surgical intervention with pain occurring in the region of the lower back lower extremity region which is aggravated by standing walking twisting turning maneuvers. We discussed additional treatment and will consider interventional treatment pending response to modification of medications. We will resume tramadol and patient will discontinue oxycodone. We will remain available to consider additional modifications of treatment regimen pending response to treatment and follow-up evaluation. The patient denies any trauma change in events of daily living the call significant change in symptomatology. All agreed to suggested treatment plan.     Physical examination   There was tenderness to palpation of the paraspinal muscles region cervical region cervical facet region a mild degree with degree palpation over the splenius capitis and occipitalis regions reproduce mild discomfort. Palpation of the acromioclavicular and glenohumeral joint region was with increased pain of mild degree and patient appeared to be able to perform drop test with mild to moderate difficulty. There was tenderness over the region of the thoracic paraspinal musculature region thoracic facet region with no crepitus of the thoracic region noted. Palpation over the lumbar paraspinal musculature region lumbar facet region was with moderate tenderness to palpation. There was tenderness over the PSIS and PII S regions palpation which be produced pain with lateral bending rotation extension and palpation over the lumbar facet region. There was no definite sensory deficit or dermatomal distribution detected. There appeared to be negative clonus negative Homans. Abdomen was without excessive tends to palpation and no costovertebral  tenderness noted. Abdomen was slightly protruded. Straight leg raising was tolerates approximately 20 without increased pain with dorsiflexion noted. EHL strength appeared to be decreased without sensory deficit or dermatomal distribution detected. There was negative clonus negative Homans. Abdomen nontender with no costovertebral tenderness noted     Assessment    DDD lumbar spine Multilevel degenerative disc disease primarily posterior disc osteophytes. Lateral recess narrowing at L3-4 through L4-5  Lumbar facet syndrome  Lumbar radiculopathy  Sacroiliac joint dysfunction  History of fractured ribs with severe thoracic spasm and component of intercostal neuralgia  Degenerative disc disease cervical spine  Cervical facet syndrome  Melanoma  (status post excision)      PLAN   Continue present medications . STOP oxycodone acetaminophen AND RESUME tramadol CAUTION Medication can cause respiratory depression and cause you to stop breathing, cause excessive sedation, cause confusion and other side effects.  Exercise extreme caution when taking medication and call EMS or go to the Emergency Department immediately if you develop any of these symptoms   We will consider lumbar epidural steroid injection pending follow-up evaluations and response to treatment  F/U PCP Dr Ronette Deter or other PCP  for evaliation of  BP and general medical  condition   F/U surgical evaluation. May consider pending follow-up evaluations  F/U neurological evaluation. May consider PNCV/EMG studies and other studies pending follow-up evaluations  Psych evaluation as discussed. Follow-up with psych evaluation as scheduled with Dr.Perrin   F/U Dr. Sarina Ser as planned  May consider radiofrequency rhizolysis or intraspinal procedures pending response to present treatment and F/U evaluation   Patient to call [facility] should patient have concerns prior to scheduled return  appointment

## 2016-04-14 NOTE — Patient Instructions (Addendum)
  PLAN   Continue present medications . STOP oxycodone acetaminophen AND RESUME tramadol CAUTION Medication can cause respiratory depression and cause you to stop breathing, cause excessive sedation, cause confusion and other side effects.  Exercise extreme caution when taking medication and call EMS or go to the Emergency Department immediately if you develop any of these symptoms   We will consider lumbar epidural steroid injection pending follow-up evaluations and response to treatment  F/U PCP Dr Ronette Deter or other PCP  for evaliation of  BP and general medical  condition   F/U surgical evaluation. May consider pending follow-up evaluations  F/U neurological evaluation. May consider PNCV/EMG studies and other studies pending follow-up evaluations  Psych evaluation as discussed. Follow-up with psych evaluation as scheduled with Dr.Perrin   F/U Dr. Sarina Ser as planned  May consider radiofrequency rhizolysis or intraspinal procedures pending response to present treatment and F/U evaluation   Patient to call [facility] should patient have concerns prior to scheduled return appointment

## 2016-04-19 ENCOUNTER — Telehealth: Payer: Self-pay | Admitting: Cardiovascular Disease

## 2016-04-19 NOTE — Telephone Encounter (Signed)
Pt called needing appointment  Nothing further needed.

## 2016-04-20 ENCOUNTER — Other Ambulatory Visit: Payer: Self-pay | Admitting: Neurology

## 2016-04-22 ENCOUNTER — Encounter: Payer: Self-pay | Admitting: Cardiovascular Disease

## 2016-04-22 ENCOUNTER — Ambulatory Visit (INDEPENDENT_AMBULATORY_CARE_PROVIDER_SITE_OTHER): Payer: Medicare Other | Admitting: Cardiovascular Disease

## 2016-04-22 VITALS — BP 152/94 | HR 82 | Ht 68.0 in | Wt 207.0 lb

## 2016-04-22 DIAGNOSIS — I10 Essential (primary) hypertension: Secondary | ICD-10-CM | POA: Diagnosis not present

## 2016-04-22 DIAGNOSIS — R55 Syncope and collapse: Secondary | ICD-10-CM | POA: Diagnosis not present

## 2016-04-22 MED ORDER — METOPROLOL SUCCINATE ER 50 MG PO TB24: 50.0000 mg | ORAL_TABLET | Freq: Every day | ORAL | 11 refills | Status: DC

## 2016-04-22 NOTE — Progress Notes (Signed)
Cardiology Office Note   Date:  04/22/2016   ID:  Justin Patel, DOB 22-Feb-1951, MRN [id number]  PCP:  [name], MD  Cardiologist:   Kathlyn Sacramento, MD   Chief Complaint  Patient presents with  . other    6 month follow up. Needs refills on medications. Denies any syncope or any other problems.       History of Present Illness: Justin Patel is a 65 y.o. male who presents for A follow-up visit. He was most recently seen by me in February 2016 for syncope versus seizures. He did have some orthostatic dizziness that improved after decreasing Toprol and discontinuing hydralazine. Around the same time, he was placed on Keppra. Since then, he has not had any recurrent episodes of loss of consciousness. He was seen in the past for chronic diastolic heart failure but that has not been an issue since he underwent gastric bypass surgery for morbid obesity. Other medical conditions include hypertension and hyperlipidemia. He has been doing very well and denies any chest pain, worsening dyspnea or palpitations. No dizziness or syncope.    Past Medical History:  Diagnosis Date  . 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 Anxiety   . Arthritis   . Cancer ([place]) 02/01/16   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  removal scheduled to clear margins.  . CHF (congestive heart failure) ([place])   . Chronic kidney disease   . Degenerative disc disease   . Diabetes mellitus    no longer diabetic-or on meds  . Gastric ulcer   . GERD (gastroesophageal reflux disease)   . H/O hiatal hernia   . Headache(784.0)   . Heart murmur   . Hx of congestive heart failure   . Hyperlipidemia   . Hypertension   . Iron deficiency anemia 02/28/2015  . Sacral fracture, closed ([place])   . Sleep apnea    hx not now since wt loss  . Stones in the urinary tract   . Syncope and collapse     Past Surgical History:  Procedure Laterality Date  . ANTERIOR CERVICAL DECOMP/DISCECTOMY  FUSION  01/26/2012   Procedure: ANTERIOR CERVICAL DECOMPRESSION/DISCECTOMY FUSION 3 LEVELS;  Surgeon: Floyce Stakes, MD;  Location: [facility];  Service: Neurosurgery;  Laterality: N/A;  Cervical three-four Cervical four-five Cervical five-six Cervical six-seven , Anterior cervical decompression/diskectomy, fusion, plate  . APPENDECTOMY    . [place], normal  . CARDIAC CATHETERIZATION     [place]  . GASTRIC BYPASS  2010   [employer]  . HERNIA REPAIR  2000   hiatal  . JOINT REPLACEMENT    . KNEE ARTHROSCOPY     bilateral, left x 2  . NASAL SINUS SURGERY     x5  . OSTEOTOMY  2001   left  . TONSILLECTOMY    . TOTAL KNEE ARTHROPLASTY Left 05/2014   Dr. Marry Guan  . UVULOPALATOPLASTY  2011     Current Outpatient Prescriptions  Medication Sig Dispense Refill  . acetaminophen (TYLENOL) 500 MG tablet Take 1,000 mg by mouth every 6 (six) hours as needed.    Marland Kitchen amLODipine (NORVASC) 5 MG tablet take 1 tablet by mouth daily 90 tablet 3  . cefUROXime (CEFTIN) 250 MG tablet Take 1 tablet (250 mg total) by mouth 2 (two) times daily with a meal. 14 tablet 0  . cyclobenzaprine (FLEXERIL) 5 MG tablet take 1 tablet  by mouth three times a day if needed for muscle spasm 30 tablet 5  . DULoxetine (CYMBALTA) 60 MG capsule take 1 capsule by mouth once daily 30 capsule 5  . furosemide (LASIX) 20 MG tablet Take 1 tablet (20 mg total) by mouth daily. (Patient taking differently: Take 20 mg by mouth as needed. ) 30 tablet 3  . levETIRAcetam (KEPPRA) 500 MG tablet take 1 tablet by mouth twice a day 60 tablet 9  . losartan (COZAAR) 100 MG tablet Take 1 tablet (100 mg total) by mouth daily. 30 tablet 5  . metoprolol succinate (TOPROL-XL) 50 MG 24 hr tablet take 1 tablet by mouth once daily 30 tablet 6  . NEXIUM 40 MG capsule take 1 capsule by mouth once daily 30 capsule 10  . rosuvastatin (CRESTOR) 10 MG tablet take 1 tablet by mouth once daily 90 tablet 3  .  sucralfate (CARAFATE) 1 g tablet take 1 tablet by mouth four times a day 120 tablet 11  . tamsulosin (FLOMAX) 0.4 MG CAPS capsule Take 1 capsule (0.4 mg total) by mouth daily. 30 capsule 3  . traMADol (ULTRAM) 50 MG tablet Limit 1-2 tablets by mouth 2-3 times per day if tolerated 150 tablet 0  . VITAMIN D, CHOLECALCIFEROL, PO Take 1,000 mg by mouth daily.      Current Facility-Administered Medications  Medication Dose Route Frequency Provider Last Rate Last Dose  . lactated ringers infusion 1,000 mL  1,000 mL Intravenous Continuous Mohammed Kindle, MD        Allergies:   Altace [ramipril]; Levaquin [levofloxacin in d5w]; Lyrica [pregabalin]; Metformin; and Iohexol    Social History:  The patient  reports that he has never smoked. He has never used smokeless tobacco. He reports that he does not drink alcohol or use drugs.   Family History:  The patient's family history includes Dementia (age of onset: 20) in his mother; Diabetes in his father; Heart disease (age of onset: 72) in his father; Lupus in his sister; Lymphoma in his sister; Ovarian cancer (age of onset: 61) in his sister.    ROS:  Please see the history of present illness.   Otherwise, review of systems are positive for none.   All other systems are reviewed and negative.    PHYSICAL EXAM: VS:  BP (!) 152/94 (BP Location: Left Arm, Patient Position: Sitting, Cuff Size: Normal)   Pulse 82   Ht 5\' 8"  (1.727 m)   Wt 207 lb (93.9 kg)   BMI 31.47 kg/m  , BMI Body mass index is 31.47 kg/m. GEN: Well nourished, well developed, in no acute distress  HEENT: normal  Neck: no JVD, carotid bruits, or masses Cardiac: RRR; no murmurs, rubs, or gallops,no edema  Respiratory:  clear to auscultation bilaterally, normal work of breathing GI: soft, nontender, nondistended, + BS MS: no deformity or atrophy  Skin: warm and dry, no rash Neuro:  Strength and sensation are intact Psych: euthymic mood, full affect   EKG:  EKG is ordered  today. The ekg ordered today demonstrates normal sinus rhythm with no significant ST or T wave changes.   Recent Labs: 06/20/2015: TSH 1.041 12/19/2015: Hemoglobin 13.0; Platelets 248.0 03/25/2016: ALT 47; BUN 15; Creatinine, Ser 1.00; Potassium 4.2; Sodium 135    Lipid Panel    Component Value Date/Time   CHOL 165 03/25/2016 1108   TRIG 118.0 03/25/2016 1108   HDL 51.60 03/25/2016 1108   CHOLHDL 3 03/25/2016 1108   VLDL 23.6 03/25/2016 1108  LDLCALC 90 03/25/2016 1108   LDLDIRECT 78.0 03/21/2013 1410      Wt Readings from Last 3 Encounters:  04/22/16 207 lb (93.9 kg)  04/14/16 180 lb (81.6 kg)  03/25/16 203 lb 9.6 oz (92.4 kg)       No flowsheet data found.    ASSESSMENT AND PLAN:  1.  Previous syncope versus seizure disorder: No recurrent episodes over the last year. He currently has no cardiac symptoms.  2. Essential hypertension: Blood pressure is mildly elevated but he was late in taking his medications today. He reports better control at home.    Disposition:   He can follow-up with me as needed. He can get refills on his antihypertensive medications from his PCP.  Signed,  Kathlyn Sacramento, MD  04/22/2016 11:02 AM    [place]

## 2016-04-22 NOTE — Patient Instructions (Addendum)
Medication Instructions:  Your physician recommends that you continue on your current medications as directed. Please refer to the Current Medication list given to you today.   Labwork: none  Testing/Procedures: none  Follow-Up: Your physician wants you to follow-up as needed.   Any Other Special Instructions Will Be Listed Below (If Applicable).     If you need a refill on your cardiac medications before your next appointment, please call your pharmacy.

## 2016-04-25 ENCOUNTER — Other Ambulatory Visit: Payer: Self-pay | Admitting: Pain Medicine

## 2016-04-28 ENCOUNTER — Telehealth: Payer: Self-pay | Admitting: Family Medicine

## 2016-04-28 MED ORDER — CYCLOBENZAPRINE HCL 5 MG PO TABS: ORAL_TABLET | ORAL | 5 refills | Status: DC

## 2016-04-28 NOTE — Telephone Encounter (Signed)
Pt called about needing a refill for cyclobenzaprine (FLEXERIL) 5 MG tablet refill for 1 month at a time instead of 10 days per pt. If possible.   Ph[facility]acy is [facility] [address] - [place], [place]  Call pt @ [phone]. Thank you!

## 2016-04-28 NOTE — Telephone Encounter (Signed)
Refill request for Flexeril, last seen 17AUG2017, last filled [id number].  Please advise.

## 2016-04-28 NOTE — Telephone Encounter (Signed)
Sent to pharmacy 

## 2016-04-29 ENCOUNTER — Other Ambulatory Visit: Payer: Self-pay | Admitting: Pain Medicine

## 2016-04-30 DIAGNOSIS — M5136 Other intervertebral disc degeneration, lumbar region: Secondary | ICD-10-CM | POA: Diagnosis not present

## 2016-04-30 DIAGNOSIS — M5416 Radiculopathy, lumbar region: Secondary | ICD-10-CM | POA: Diagnosis not present

## 2016-05-01 ENCOUNTER — Other Ambulatory Visit: Payer: Self-pay | Admitting: Pain Medicine

## 2016-05-17 ENCOUNTER — Other Ambulatory Visit: Payer: Self-pay | Admitting: Pain Medicine

## 2016-05-17 DIAGNOSIS — M791 Myalgia: Secondary | ICD-10-CM | POA: Diagnosis not present

## 2016-05-17 DIAGNOSIS — M47817 Spondylosis without myelopathy or radiculopathy, lumbosacral region: Secondary | ICD-10-CM | POA: Diagnosis not present

## 2016-05-17 DIAGNOSIS — M461 Sacroiliitis, not elsewhere classified: Secondary | ICD-10-CM | POA: Diagnosis not present

## 2016-05-17 DIAGNOSIS — M5416 Radiculopathy, lumbar region: Secondary | ICD-10-CM | POA: Diagnosis not present

## 2016-05-21 DIAGNOSIS — J019 Acute sinusitis, unspecified: Secondary | ICD-10-CM | POA: Diagnosis not present

## 2016-05-21 DIAGNOSIS — K121 Other forms of stomatitis: Secondary | ICD-10-CM | POA: Diagnosis not present

## 2016-05-21 DIAGNOSIS — J040 Acute laryngitis: Secondary | ICD-10-CM | POA: Diagnosis not present

## 2016-05-21 DIAGNOSIS — R490 Dysphonia: Secondary | ICD-10-CM | POA: Diagnosis not present

## 2016-06-07 ENCOUNTER — Encounter: Payer: Self-pay | Admitting: Neurology

## 2016-06-07 ENCOUNTER — Ambulatory Visit (INDEPENDENT_AMBULATORY_CARE_PROVIDER_SITE_OTHER): Payer: Medicare Other | Admitting: Neurology

## 2016-06-07 VITALS — BP 142/68 | HR 95 | Ht 68.0 in | Wt 220.0 lb

## 2016-06-07 DIAGNOSIS — G40909 Epilepsy, unspecified, not intractable, without status epilepticus: Secondary | ICD-10-CM

## 2016-06-07 DIAGNOSIS — F419 Anxiety disorder, unspecified: Secondary | ICD-10-CM

## 2016-06-07 DIAGNOSIS — F444 Conversion disorder with motor symptom or deficit: Secondary | ICD-10-CM | POA: Diagnosis not present

## 2016-06-07 MED ORDER — LAMOTRIGINE ER 25 MG PO TB24: ORAL_TABLET | ORAL | 0 refills | Status: DC

## 2016-06-07 MED ORDER — LAMOTRIGINE ER 100 MG PO TB24: 100.0000 mg | ORAL_TABLET | Freq: Every day | ORAL | 3 refills | Status: DC

## 2016-06-07 NOTE — Progress Notes (Signed)
NEUROLOGY FOLLOW UP OFFICE NOTE  [facility] HC:[id number]  HISTORY OF PRESENT ILLNESS: Justin Patel is a 65 year old right-handed man with type II diabetes, hypertension, status post gastric bypass, CAD status post catheterization and history of left TKR and GI bleed and post-traumatic subdural hematoma who follows up for recurrent episodes of transient altered awareness/possible seizure disorder.  Labs reviewed.   UPDATE: He takes Keppra 500mg  twice daily.  He has not had recurrent seizure activity.  However, he had a couple of syncopal episodes over the past year, related to hypotension and changes to his blood pressure medication.  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  He reports increased anxiety.  Over the past year, he has had increased kinetic tremor in his hands that interferes with writing.  When he feels anxious, his entire body shakes.  He reports weight gain over the past several months.   HISTORY: He was on lovenox following left total knee replacement on 05/14/14.  He passed out and fell on 06/03/14, hitting the back of his head.  He was waiting for the repairman.  The last thing he remembers was getting a call from the repairman saying he will be at the house in 10 minutes.  Then next thing he remembers was waking up in [facility].  Reportedly, when he answered the door, the repairman said he looked strange and his body became rigid and just fell backwards.  He was convulsing on the ground.  CT of the head showed a small subdural hematoma along the right interhemispheric fissure.  MRI of the brain showed post-traumatic shearing injury presenting as medial right frontal acute parenchymal hemorrhage with adjacent subdural and subarachnoid blood.  It also revealed associated ischemia in the right frontal subcortical white matter, due to injury.  2D echo LVEF 55-60%.  EEG was normal.  Anticoagulation was held.  CT of the head performed on 06/05/14 showed  interval improvement in the bleed.   Due to possibility that these spells are symptomatic complex partial seizures, he was started on Keppra 500mg  twice daily.     He had a 24 hour ambulatory EEG, which was normal but also did not capture one of his habitual spells.  Otherwise, he has been doing well.  He has had no recurrent spells.  He feels a little fatigued but is tolerating the Keppra.   Since 2010, he had episodes of bleed following gastric bypass surgery in which he had episodes of syncope.  One time, EMS arrived after he had passed out and fell in the bathtub.  He had voided his bowls.  EMS told him he had a seizure.  He also reports an episode of transient global amnesia in 2012.  PAST MEDICAL HISTORY: Past Medical History:  Diagnosis Date  . 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 Anxiety   . Arthritis   . Cancer ([place]) 02/01/16   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  removal scheduled to clear margins.  . CHF (congestive heart failure) ([place])   . Chronic kidney disease   . Degenerative disc disease   . Diabetes mellitus    no longer diabetic-or on meds  . Gastric ulcer   . GERD (gastroesophageal reflux disease)   . H/O hiatal hernia   . Headache(784.0)   . Heart murmur   . Hx of congestive heart failure   . Hyperlipidemia   . Hypertension   .  Iron deficiency anemia 02/28/2015  . Sacral fracture, closed ([place])   . Sleep apnea    hx not now since wt loss  . Stones in the urinary tract   . Syncope and collapse     MEDICATIONS: Current Outpatient Prescriptions on File Prior to Visit  Medication Sig Dispense Refill  . acetaminophen (TYLENOL) 500 MG tablet Take 1,000 mg by mouth every 6 (six) hours as needed.    . cefUROXime (CEFTIN) 250 MG tablet Take 1 tablet (250 mg total) by mouth 2 (two) times daily with a meal. 14 tablet 0  . cyclobenzaprine (FLEXERIL) 5 MG tablet take 1 tablet by mouth three times a day if needed for muscle spasm 90 tablet  5  . DULoxetine (CYMBALTA) 60 MG capsule take 1 capsule by mouth once daily 30 capsule 5  . furosemide (LASIX) 20 MG tablet Take 1 tablet (20 mg total) by mouth daily. (Patient taking differently: Take 20 mg by mouth as needed. ) 30 tablet 3  . levETIRAcetam (KEPPRA) 500 MG tablet take 1 tablet by mouth twice a day 60 tablet 9  . losartan (COZAAR) 100 MG tablet Take 1 tablet (100 mg total) by mouth daily. 30 tablet 5  . metoprolol succinate (TOPROL-XL) 50 MG 24 hr tablet Take 1 tablet (50 mg total) by mouth daily. Take with or immediately following a meal. 30 tablet 11  . NEXIUM 40 MG capsule take 1 capsule by mouth once daily 30 capsule 10  . rosuvastatin (CRESTOR) 10 MG tablet take 1 tablet by mouth once daily 90 tablet 3  . sucralfate (CARAFATE) 1 g tablet take 1 tablet by mouth four times a day 120 tablet 11  . tamsulosin (FLOMAX) 0.4 MG CAPS capsule Take 1 capsule (0.4 mg total) by mouth daily. 30 capsule 3  . traMADol (ULTRAM) 50 MG tablet Limit 1-2 tablets by mouth 2-3 times per day if tolerated 150 tablet 0  . VITAMIN D, CHOLECALCIFEROL, PO Take 1,000 mg by mouth daily.      Current Facility-Administered Medications on File Prior to Visit  Medication Dose Route Frequency Provider Last Rate Last Dose  . lactated ringers infusion 1,000 mL  1,000 mL Intravenous Continuous Mohammed Kindle, MD        ALLERGIES: Allergies  Allergen Reactions  . Altace [Ramipril] Anaphylaxis  . Levaquin [Levofloxacin In D5w] Hives  . Lyrica [Pregabalin]     Edema  . Metformin Other (See Comments) and Diarrhea    High doses cause diarrhea. High doses cause diarrhea.  . Iohexol Hives     Desc: HIVES     FAMILY HISTORY: Family History  Problem Relation Age of Onset  . Dementia Mother 64  . Heart disease Father 78  . Diabetes Father   . Lymphoma Sister     lymphoma, stage 4  . Ovarian cancer Sister 29    Ovarian  . Lupus Sister    .  SOCIAL HISTORY: Social History   Social History  .  Marital status: Married    Spouse name: N/A  . Number of children: N/A  . Years of education: N/A   Occupational History  . Not on file.   Social History Main Topics  . Smoking status: Never Smoker  . Smokeless tobacco: Never Used  . Alcohol use No     Comment: stopped 18months ago  . Drug use: No  . Sexual activity: Yes    Partners: Female   Other Topics Concern  . Not on file  Social History Narrative   Lives in [place] with wife, [place]. 2 sons, William Hamburger, Lennette Bihari 75.. 4 grandchildren      Work - Retired, previously taught music in [place] - regular diet, limited quantities after gastric bypass      Exercise - no regular, limited by arthritis in knees, occasional water aerobics    REVIEW OF SYSTEMS: Constitutional: No fevers, chills, or sweats, no generalized fatigue, change in appetite Eyes: No visual changes, double vision, eye pain Ear, nose and throat: No hearing loss, ear pain, nasal congestion, sore throat Cardiovascular: No chest pain, palpitations Respiratory:  No shortness of breath at rest or with exertion, wheezes GastrointestinaI: No nausea, vomiting, diarrhea, abdominal pain, fecal incontinence Genitourinary:  No dysuria, urinary retention or frequency Musculoskeletal:  No neck pain, back pain Integumentary: No rash, pruritus, skin lesions Neurological: as above Psychiatric: No depression, insomnia, anxiety Endocrine: No palpitations, fatigue, diaphoresis, mood swings, change in appetite, change in weight, increased thirst Hematologic/Lymphatic:  No purpura, petechiae. Allergic/Immunologic: no itchy/runny eyes, nasal congestion, recent allergic reactions, rashes  PHYSICAL EXAM: Vitals:   06/07/16 1330  BP: (!) 142/68  Pulse: 95   General: No acute distress.  Patient appears well-groomed.  Head:  Normocephalic/atraumatic Eyes:  Fundi examined but not visualized Neck: supple, no paraspinal tenderness, full range of  motion Heart:  Regular rate and rhythm Lungs:  Clear to auscultation bilaterally Back: No paraspinal tenderness Neurological Exam: alert and oriented to person, place, and time. Attention span and concentration intact, recent and remote memory intact, fund of knowledge intact.  Speech fluent and not dysarthric, language intact.  CN II-XII intact. Bulk and tone normal, muscle strength 5/5 throughout.  Abrupt onset of tremor in both hands when he extends his arms out in front of him.  No rigidity or bradykinesia.  Sensation to light touch  intact.  Deep tendon reflexes 2+ throughout.  Finger to nose testing intact.  Gait normal  IMPRESSION: 1.  Possible seizure disorder.  He had two episodes of witnessed seizure, at least one of them unprovoked.  Non-epileptic spells cannot be ruled out. 2.  Psychogenic tremor.  Tremor does not seem physiologic. 3.  Anxiety  PLAN: 1.  Since Keppra may cause irritability and change in mood, we will switch from Keppra to Lamictal.  Once we titrate Lamictal XR up to 100mg  daily, plan will be to discontinue Keppra. 2.  Recommend seeing psychiatry to manage treatment of anxiety. 3.  Follow up in 3 to 4 months.  He should contact me in 5 weeks when he starts 100mg  daily of Lamictal and we can discontinue Keppra.  Advised to monitor for new or unusual rash.  26 minutes spent face to face with patient, over 50% spent counseling.  Metta Clines, DO  CC:  [name], MD

## 2016-06-07 NOTE — Patient Instructions (Addendum)
We will switch from Keppra to Lamictal 25mg  XR starter pack.. Week 1&2   25mg  daily Week 3&4   50mg  daily Week 5 and thereafter 100mg  daily  Contact me once you reach 100mg  daily.  Call if you develop any new unusual rash.   2.  Remain on Keppra in the meantime.  Plan is to discontinue Keppra once you are taking Lamictal 50mg  twice daily 3.  Follow up in 3 to 4 months. 4.  Consider seeing psychiatry to adjust medications to treat anxiety 5.  Recommend Mediterranean diet    Why follow it? Research shows. . Those who follow the Mediterranean diet have a reduced risk of heart disease  . The diet is associated with a reduced incidence of Parkinson's and Alzheimer's diseases . People following the diet may have longer life expectancies and lower rates of chronic diseases  . The Dietary Guidelines for Americans recommends the Mediterranean diet as an eating plan to promote health and prevent disease  What Is the Mediterranean Diet?  . Healthy eating plan based on typical foods and recipes of Mediterranean-style cooking . The diet is primarily a plant based diet; these foods should make up a majority of meals   Starches - Plant based foods should make up a majority of meals - They are an important sources of vitamins, minerals, energy, antioxidants, and fiber - Choose whole grains, foods high in fiber and minimally processed items  - Typical grain sources include wheat, oats, barley, corn, brown rice, bulgar, farro, millet, polenta, couscous  - Various types of beans include chickpeas, lentils, fava beans, black beans, white beans   Fruits  Veggies - Large quantities of antioxidant rich fruits & veggies; 6 or more servings  - Vegetables can be eaten raw or lightly drizzled with oil and cooked  - Vegetables common to the traditional Mediterranean Diet include: artichokes, arugula, beets, broccoli, brussel sprouts, cabbage, carrots, celery, collard greens, cucumbers, eggplant, kale, leeks, lemons,  lettuce, mushrooms, okra, onions, peas, peppers, potatoes, pumpkin, radishes, rutabaga, shallots, spinach, sweet potatoes, turnips, zucchini - Fruits common to the Mediterranean Diet include: apples, apricots, avocados, cherries, clementines, dates, figs, grapefruits, grapes, melons, nectarines, oranges, peaches, pears, pomegranates, strawberries, tangerines  Fats - Replace butter and margarine with healthy oils, such as olive oil, canola oil, and tahini  - Limit nuts to no more than a handful a day  - Nuts include walnuts, almonds, pecans, pistachios, pine nuts  - Limit or avoid candied, honey roasted or heavily salted nuts - Olives are central to the [employer] - can be eaten whole or used in a variety of dishes   Meats Protein - Limiting red meat: no more than a few times a month - When eating red meat: choose lean cuts and keep the portion to the size of deck of cards - Eggs: approx. 0 to 4 times a week  - Fish and lean poultry: at least 2 a week  - Healthy protein sources include, chicken, [place], lean beef, lamb - Increase intake of seafood such as tuna, salmon, trout, mackerel, shrimp, scallops - Avoid or limit high fat processed meats such as sausage and bacon  Dairy - Include moderate amounts of low fat dairy products  - Focus on healthy dairy such as fat free yogurt, skim milk, low or reduced fat cheese - Limit dairy products higher in fat such as whole or 2% milk, cheese, ice cream  Alcohol - Moderate amounts of red wine is ok  - No  more than 5 oz daily for women (all ages) and men older than age 69  - No more than 10 oz of wine daily for men younger than 53  Other - Limit sweets and other desserts  - Use herbs and spices instead of salt to flavor foods  - Herbs and spices common to the traditional Mediterranean Diet include: basil, bay leaves, chives, cloves, cumin, fennel, garlic, lavender, marjoram, mint, oregano, parsley, pepper, rosemary, sage, savory, sumac, tarragon,  thyme   It's not just a diet, it's a lifestyle:  . The Mediterranean diet includes lifestyle factors typical of those in the region  . Foods, drinks and meals are best eaten with others and savored . Daily physical activity is important for overall good health . This could be strenuous exercise like running and aerobics . This could also be more leisurely activities such as walking, housework, yard-work, or taking the stairs . Moderation is the key; a balanced and healthy diet accommodates most foods and drinks . Consider portion sizes and frequency of consumption of certain foods   Meal Ideas & Options:  . Breakfast:  o Whole wheat toast or whole wheat English muffins with peanut butter & hard boiled egg o Steel cut oats topped with apples & cinnamon and skim milk  o Fresh fruit: banana, strawberries, melon, berries, peaches  o Smoothies: strawberries, bananas, greek yogurt, peanut butter o Low fat greek yogurt with blueberries and granola  o Egg white omelet with spinach and mushrooms o Breakfast couscous: whole wheat couscous, apricots, skim milk, cranberries  . Sandwiches:  o Hummus and grilled vegetables (peppers, zucchini, squash) on whole wheat bread   o Grilled chicken on whole wheat pita with lettuce, tomatoes, cucumbers or tzatziki  o Tuna salad on whole wheat bread: tuna salad made with greek yogurt, olives, red peppers, capers, green onions o Garlic rosemary lamb pita: lamb sauted with garlic, rosemary, salt & pepper; add lettuce, cucumber, greek yogurt to pita - flavor with lemon juice and black pepper  . Seafood:  o Mediterranean grilled salmon, seasoned with garlic, basil, parsley, lemon juice and black pepper o Shrimp, lemon, and spinach whole-grain pasta salad made with low fat greek yogurt  o Seared scallops with lemon orzo  o Seared tuna steaks seasoned salt, pepper, coriander topped with tomato mixture of olives, tomatoes, olive oil, minced garlic, parsley, green  onions and cappers  . Meats:  o Herbed greek chicken salad with kalamata olives, cucumber, feta  o Red bell peppers stuffed with spinach, bulgur, lean ground beef (or lentils) & topped with feta   o Kebabs: skewers of chicken, tomatoes, onions, zucchini, squash  o [place] burgers: made with red onions, mint, dill, lemon juice, feta cheese topped with roasted red peppers . Vegetarian o Cucumber salad: cucumbers, artichoke hearts, celery, red onion, feta cheese, tossed in olive oil & lemon juice  o Hummus and whole grain pita points with a greek salad (lettuce, tomato, feta, olives, cucumbers, red onion) o Lentil soup with celery, carrots made with vegetable broth, garlic, salt and pepper  o Tabouli salad: parsley, bulgur, mint, scallions, cucumbers, tomato, radishes, lemon juice, olive oil, salt and pepper.

## 2016-06-14 ENCOUNTER — Telehealth: Payer: Self-pay | Admitting: Family Medicine

## 2016-06-14 MED ORDER — TAMSULOSIN HCL 0.4 MG PO CAPS: 0.4000 mg | ORAL_CAPSULE | Freq: Every day | ORAL | 3 refills | Status: DC

## 2016-06-14 NOTE — Telephone Encounter (Signed)
Needing a refill on tamsulosin (FLOMAX) 0.4 MG CAPS capsule.  FYI the patient also has resumed taking his Furosemide

## 2016-06-14 NOTE — Telephone Encounter (Signed)
Flomax sent to pharmacy

## 2016-06-14 NOTE — Telephone Encounter (Signed)
Is it ok refill this

## 2016-06-15 DIAGNOSIS — M47817 Spondylosis without myelopathy or radiculopathy, lumbosacral region: Secondary | ICD-10-CM | POA: Diagnosis not present

## 2016-06-15 DIAGNOSIS — M5416 Radiculopathy, lumbar region: Secondary | ICD-10-CM | POA: Diagnosis not present

## 2016-06-15 DIAGNOSIS — M791 Myalgia: Secondary | ICD-10-CM | POA: Diagnosis not present

## 2016-06-15 DIAGNOSIS — M461 Sacroiliitis, not elsewhere classified: Secondary | ICD-10-CM | POA: Diagnosis not present

## 2016-06-25 ENCOUNTER — Encounter: Payer: Self-pay | Admitting: Family Medicine

## 2016-06-25 ENCOUNTER — Ambulatory Visit (INDEPENDENT_AMBULATORY_CARE_PROVIDER_SITE_OTHER): Payer: Medicare Other | Admitting: Family Medicine

## 2016-06-25 VITALS — BP 132/84 | HR 80 | Temp 98.4°F | Wt 220.0 lb

## 2016-06-25 DIAGNOSIS — I10 Essential (primary) hypertension: Secondary | ICD-10-CM

## 2016-06-25 DIAGNOSIS — E66811 Obesity, class 1: Secondary | ICD-10-CM

## 2016-06-25 DIAGNOSIS — E785 Hyperlipidemia, unspecified: Secondary | ICD-10-CM | POA: Diagnosis not present

## 2016-06-25 DIAGNOSIS — Z23 Encounter for immunization: Secondary | ICD-10-CM | POA: Diagnosis not present

## 2016-06-25 DIAGNOSIS — R7309 Other abnormal glucose: Secondary | ICD-10-CM

## 2016-06-25 DIAGNOSIS — F419 Anxiety disorder, unspecified: Secondary | ICD-10-CM

## 2016-06-25 DIAGNOSIS — E6609 Other obesity due to excess calories: Secondary | ICD-10-CM

## 2016-06-25 DIAGNOSIS — F39 Unspecified mood [affective] disorder: Secondary | ICD-10-CM | POA: Insufficient documentation

## 2016-06-25 DIAGNOSIS — Z6833 Body mass index (BMI) 33.0-33.9, adult: Secondary | ICD-10-CM

## 2016-06-25 LAB — COMPREHENSIVE METABOLIC PANEL
ALT: 41 U/L (ref 0–53)
AST: 20 U/L (ref 0–37)
Albumin: 3.9 g/dL (ref 3.5–5.2)
Alkaline Phosphatase: 58 U/L (ref 39–117)
BILIRUBIN TOTAL: 0.3 mg/dL (ref 0.2–1.2)
BUN: 26 mg/dL — ABNORMAL HIGH (ref 6–23)
CALCIUM: 9.2 mg/dL (ref 8.4–10.5)
CHLORIDE: 103 meq/L (ref 96–112)
CO2: 28 meq/L (ref 19–32)
Creatinine, Ser: 1.08 mg/dL (ref 0.40–1.50)
GFR: 72.85 mL/min (ref >60.00)
Glucose, Bld: 150 mg/dL — ABNORMAL HIGH (ref 70–99)
POTASSIUM: 4 meq/L (ref 3.5–5.1)
Sodium: 140 mEq/L (ref 135–145)
Total Protein: 7 g/dL (ref 6.0–8.3)

## 2016-06-25 LAB — HEMOGLOBIN A1C: Hgb A1c MFr Bld: 7.5 % — ABNORMAL HIGH (ref 4.6–6.5)

## 2016-06-25 NOTE — Assessment & Plan Note (Signed)
Weight has trended up. Suspect related to dietary indiscretions. Could be related to volume status though he has no edema. Most recent EF in the normal range. No CHF symptoms. Discussed diet with the patient. He can continue the Lasix. We'll check an CMP today.

## 2016-06-25 NOTE — Progress Notes (Signed)
Pre visit review using our clinic review tool, if applicable. No additional management support is needed unless otherwise documented below in the visit note. 

## 2016-06-25 NOTE — Patient Instructions (Addendum)
Nice to see you. We will check some lab work and call you with the results. Please work on diet as we discussed. Please monitor anxiety and if this worsens please let us know.

## 2016-06-25 NOTE — Assessment & Plan Note (Signed)
>>  ASSESSMENT AND PLAN FOR ANXIETY WRITTEN ON 06/25/2016  7:07 PM BY SONNENBERG, ERIC G, MD  Mild in nature. Not very frequent. Discussed continuing to monitor and if worsens letting us  know.

## 2016-06-25 NOTE — Assessment & Plan Note (Signed)
Tolerating Crestor. Continue this medication. 

## 2016-06-25 NOTE — Assessment & Plan Note (Signed)
At goal. Continue current medications. Check CMP today. 

## 2016-06-25 NOTE — Assessment & Plan Note (Signed)
Mild in nature. Not very frequent. Discussed continuing to monitor and if worsens letting us know.

## 2016-06-25 NOTE — Progress Notes (Signed)
  [name], MD Phone: [phone]  Justin Patel is a 65 y.o. male who presents today for f/u.  HYPERLIPIDEMIA Symptoms Chest pain on exertion:  no    Medications: Compliance- taking crestor Right upper quadrant pain- no  Muscle aches- no  HYPERTENSION  Disease Monitoring  Home BP Monitoring 130's/80's Chest pain- no    Dyspnea- no Medications  Compliance-  Taking losartan, metoprolol.  Edema- no  Patient notes he has put on about 20 pounds since last time we saw each other. He's been going to get doughnuts more frequently. Not exercising much due to his back. Notes he only eats a small amount of food at home. 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 Notes the weight gain did start after he discontinued Lasix. He's lost 6-7 pounds since starting back on Lasix. Does report a history of heart failure though his most recent EF was 55-60%. 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 No orthopnea or PND. No lower external swelling.  Some intermittent anxiety. Lots of life changes at home. One of his children is moving back to [place]. He is retired somewhat recently and is having difficulty finding things to fill his time. No depression. Anxiety occurs rarely.   PMH: nonsmoker.   ROS see history of present illness  Objective  Physical Exam Vitals:   06/25/16 1006  BP: 132/84  Pulse: 80  Temp: 98.4 F (36.9 C)    BP Readings from Last 3 Encounters:  06/25/16 132/84  06/07/16 (!) 142/68  04/22/16 (!) 152/94   Wt Readings from Last 3 Encounters:  06/25/16 220 lb (99.8 kg)  06/07/16 220 lb (99.8 kg)  04/22/16 207 lb (93.9 kg)    Physical Exam  Constitutional: No distress.  Cardiovascular: Normal rate, regular rhythm and normal heart sounds.   Pulmonary/Chest: Effort normal and breath sounds normal.  Abdominal: Soft. Bowel sounds are normal. He exhibits no distension. There is no tenderness. There is no rebound and no guarding.  Musculoskeletal: He  exhibits no edema.  Neurological: He is alert. Gait normal.  Skin: Skin is warm and dry. He is not diaphoretic.  Psychiatric: Mood and affect normal.     Assessment/Plan: Please see individual problem list.  Essential hypertension, benign At goal. Continue current medications. Check CMP today.  Hyperlipidemia Tolerating Crestor. Continue this medication.  Elevated glucose Elevated on prior checks. We'll check an A1c today.  Obesity Weight has trended up. Suspect related to dietary indiscretions. Could be related to volume status though he has no edema. Most recent EF in the normal range. No CHF symptoms. Discussed diet with the patient. He can continue the Lasix. We'll check an CMP today.  Anxiety Mild in nature. Not very frequent. Discussed continuing to monitor and if worsens letting us know.   Orders Placed This Encounter  Procedures  . Flu vaccine HIGH DOSE PF  . Comp Met (CMET)  . HgB A1c    [name], MD [place]

## 2016-06-25 NOTE — Assessment & Plan Note (Signed)
Elevated on prior checks. We'll check an A1c today.

## 2016-06-30 ENCOUNTER — Other Ambulatory Visit: Payer: Self-pay | Admitting: Family Medicine

## 2016-06-30 MED ORDER — BLOOD GLUCOSE MONITOR KIT: PACK | 0 refills | Status: DC

## 2016-06-30 MED ORDER — GLIPIZIDE 5 MG PO TABS: 5.0000 mg | ORAL_TABLET | Freq: Every day | ORAL | 3 refills | Status: DC

## 2016-07-06 ENCOUNTER — Other Ambulatory Visit: Payer: Self-pay | Admitting: Family Medicine

## 2016-07-06 NOTE — Telephone Encounter (Signed)
Okay to refill? No longer on med list. Amlodipine was discontinued on 06/07/16 by neurology.

## 2016-07-07 NOTE — Telephone Encounter (Signed)
Please determine if patient is taking this medicine. It appears that he was not on this the last time we saw each other. Thanks.

## 2016-07-08 ENCOUNTER — Telehealth: Payer: Self-pay | Admitting: Family Medicine

## 2016-07-08 MED ORDER — AMLODIPINE BESYLATE 5 MG PO TABS: 5.0000 mg | ORAL_TABLET | Freq: Every day | ORAL | 3 refills | Status: DC

## 2016-07-08 NOTE — Telephone Encounter (Signed)
Pt called requesting refill on medication amlodipine. Pt is completely out of medication, he would also like it to be filled for 90 days. Please advise, thank you!  Pharmacy - [facility], [place]  Call pt @ [phone]

## 2016-07-08 NOTE — Telephone Encounter (Signed)
Spoke with patient and he stated that he has been taking this medication

## 2016-07-08 NOTE — Telephone Encounter (Signed)
Sent to pharmacy 

## 2016-07-12 NOTE — Telephone Encounter (Signed)
LM for patient to return call.

## 2016-07-13 DIAGNOSIS — M5416 Radiculopathy, lumbar region: Secondary | ICD-10-CM | POA: Diagnosis not present

## 2016-07-13 DIAGNOSIS — M791 Myalgia: Secondary | ICD-10-CM | POA: Diagnosis not present

## 2016-07-13 DIAGNOSIS — M461 Sacroiliitis, not elsewhere classified: Secondary | ICD-10-CM | POA: Diagnosis not present

## 2016-07-13 DIAGNOSIS — M47817 Spondylosis without myelopathy or radiculopathy, lumbosacral region: Secondary | ICD-10-CM | POA: Diagnosis not present

## 2016-07-15 NOTE — Telephone Encounter (Signed)
LM for patient to return call.

## 2016-07-16 NOTE — Telephone Encounter (Signed)
Patient is taking this medication. It was sent to pharmacy on 07/08/16

## 2016-07-29 ENCOUNTER — Encounter: Payer: Self-pay | Admitting: Family Medicine

## 2016-07-29 ENCOUNTER — Ambulatory Visit (INDEPENDENT_AMBULATORY_CARE_PROVIDER_SITE_OTHER): Payer: Medicare Other | Admitting: Family Medicine

## 2016-07-29 VITALS — BP 134/80 | HR 99 | Temp 98.3°F | Wt 231.2 lb

## 2016-07-29 DIAGNOSIS — J0100 Acute maxillary sinusitis, unspecified: Secondary | ICD-10-CM | POA: Diagnosis not present

## 2016-07-29 DIAGNOSIS — R600 Localized edema: Secondary | ICD-10-CM

## 2016-07-29 LAB — COMPREHENSIVE METABOLIC PANEL
ALT: 33 U/L (ref 0–53)
AST: 18 U/L (ref 0–37)
Albumin: 4 g/dL (ref 3.5–5.2)
Alkaline Phosphatase: 60 U/L (ref 39–117)
BUN: 18 mg/dL (ref 6–23)
CHLORIDE: 100 meq/L (ref 96–112)
CO2: 28 meq/L (ref 19–32)
CREATININE: 1.09 mg/dL (ref 0.40–1.50)
Calcium: 9.2 mg/dL (ref 8.4–10.5)
GFR: 72.06 mL/min (ref >60.00)
Glucose, Bld: 207 mg/dL — ABNORMAL HIGH (ref 70–99)
Potassium: 4.3 mEq/L (ref 3.5–5.1)
SODIUM: 135 meq/L (ref 135–145)
Total Bilirubin: 0.3 mg/dL (ref 0.2–1.2)
Total Protein: 6.3 g/dL (ref 6.0–8.3)

## 2016-07-29 LAB — BRAIN NATRIURETIC PEPTIDE: Pro B Natriuretic peptide (BNP): 24 pg/mL (ref 0.0–100.0)

## 2016-07-29 LAB — CBC
HCT: 39.4 % (ref 39.0–52.0)
Hemoglobin: 13.1 g/dL (ref 13.0–17.0)
MCHC: 33.4 g/dL (ref 30.0–36.0)
MCV: 98.5 fl (ref 78.0–100.0)
PLATELETS: 232 10*3/uL (ref 150.0–400.0)
RBC: 4 Mil/uL — ABNORMAL LOW (ref 4.22–5.81)
RDW: 14.3 % (ref 11.5–15.5)
WBC: 12.8 10*3/uL — ABNORMAL HIGH (ref 4.0–10.5)

## 2016-07-29 LAB — TSH: TSH: 1 u[IU]/mL (ref 0.35–4.50)

## 2016-07-29 MED ORDER — DOXYCYCLINE HYCLATE 100 MG PO TABS: 100.0000 mg | ORAL_TABLET | Freq: Two times a day (BID) | ORAL | 0 refills | Status: DC

## 2016-07-29 MED ORDER — HYDROCOD POLST-CPM POLST ER 10-8 MG/5ML PO SUER: 5.0000 mL | Freq: Two times a day (BID) | ORAL | 0 refills | Status: DC | PRN

## 2016-07-29 NOTE — Assessment & Plan Note (Signed)
Patient's symptoms most consistent with bacterial sinusitis given duration, fevers, and purulent nasal discharge. Benign lung exam. Vital signs stable today. We'll treat with doxycycline to provide coverage for any lung pathology as well given his history of pneumonia. He did want to hold off on a chest x-ray at this time. Tussionex for cough. Given return precautions.

## 2016-07-29 NOTE — Progress Notes (Signed)
Pre visit review using our clinic review tool, if applicable. No additional management support is needed unless otherwise documented below in the visit note. 

## 2016-07-29 NOTE — Progress Notes (Signed)
  [name], MD Phone: [phone]  Justin Patel is a 65 y.o. male who presents today for same-day visit.  Patient notes 1 month of sinus congestion and cough. Cough is nonproductive. Has started to blow green silly putty-looking material out of his nose. He notes some sore ribs with this. No shortness of breath. Had a fever yesterday 101F. Sore throat over the last week. Has tried NyQuil and Flonase. Has not been beneficial. Notes he feels shaky as well. Also notes he feels as though his weight is has up and swelling bilaterally in his ankles. He feels as though he's putting weight on all of a sudden. No orthopnea. No chest pain or palpitations.  PMH: nonsmoker.   ROS see history of present illness  Objective  Physical Exam Vitals:   07/29/16 1021  BP: 134/80  Pulse: 99  Temp: 98.3 F (36.8 C)    BP Readings from Last 3 Encounters:  07/29/16 134/80  06/25/16 132/84  06/07/16 (!) 142/68   Wt Readings from Last 3 Encounters:  07/29/16 231 lb 3.2 oz (104.9 kg)  06/25/16 220 lb (99.8 kg)  06/07/16 220 lb (99.8 kg)    Physical Exam  Constitutional: No distress.  HENT:  Head: Normocephalic and atraumatic.  Mouth/Throat: Oropharynx is clear and moist. No oropharyngeal exudate.  Normal TMs bilaterally, bilateral maxillary and frontal sinus tenderness to percussion  Eyes: Conjunctivae are normal. Pupils are equal, round, and reactive to light.  Neck: Neck supple.  Cardiovascular: Normal rate, regular rhythm and normal heart sounds.   Pulmonary/Chest: Effort normal and breath sounds normal.  Musculoskeletal: He exhibits no edema.  Lymphadenopathy:    He has no cervical adenopathy.  Neurological: He is alert. Gait normal.  Skin: Skin is warm and dry. He is not diaphoretic.     Assessment/Plan: Please see individual problem list.  Bilateral lower extremity edema Does continue to have issues with this. Weight is up though patient had been eating a significant  number of doughnuts which he has stopped. No orthopnea or shortness of breath. We will check lab work as outlined below. We'll check a BNP to ensure no fluid retention. He does take Lasix. Does have a history of diastolic grade 1 dysfunction. He'll continue to monitor.  Sinusitis, acute maxillary Patient's symptoms most consistent with bacterial sinusitis given duration, fevers, and purulent nasal discharge. Benign lung exam. Vital signs stable today. We'll treat with doxycycline to provide coverage for any lung pathology as well given his history of pneumonia. He did want to hold off on a chest x-ray at this time. Tussionex for cough. Given return precautions.   Orders Placed This Encounter  Procedures  . CBC  . Comp Met (CMET)  . TSH  . B Justin Peptide    Meds ordered this encounter  Medications  . doxycycline (VIBRA-TABS) 100 MG tablet    Sig: Take 1 tablet (100 mg total) by mouth 2 (two) times daily.    Dispense:  14 tablet    Refill:  0  . chlorpheniramine-HYDROcodone (TUSSIONEX PENNKINETIC ER) 10-8 MG/5ML SUER    Sig: Take 5 mLs by mouth every 12 (twelve) hours as needed for cough.    Dispense:  140 mL    Refill:  0    [name], MD [place]

## 2016-07-29 NOTE — Assessment & Plan Note (Signed)
Does continue to have issues with this. Weight is up though patient had been eating a significant number of doughnuts which he has stopped. No orthopnea or shortness of breath. We will check lab work as outlined below. We'll check a BNP to ensure no fluid retention. He does take Lasix. Does have a history of diastolic grade 1 dysfunction. He'll continue to monitor.

## 2016-07-29 NOTE — Patient Instructions (Signed)
Nice to see you. You likely have a sinus infection. We'll treat you doxycycline. You can take tussionex for cough. We are going to obtain some lab work as well to help evaluate your lower extremity swelling and weight gain. If you develop shortness of breath, cough productive of blood, persistent fevers, or any new or changing symptoms please seek medical attention immediately.

## 2016-07-30 IMAGING — CR DG CHEST 2V
1 series · 2 of 2 positions shown · non-contrast
Comparison: 06/19/2015

CLINICAL DATA: Fell from standing position at [DATE]

EXAM:
CHEST  2 VIEW

[Series 1: x chest ap · 0.14mm/px · 2 of 2 slices shown]
[im 1/2]
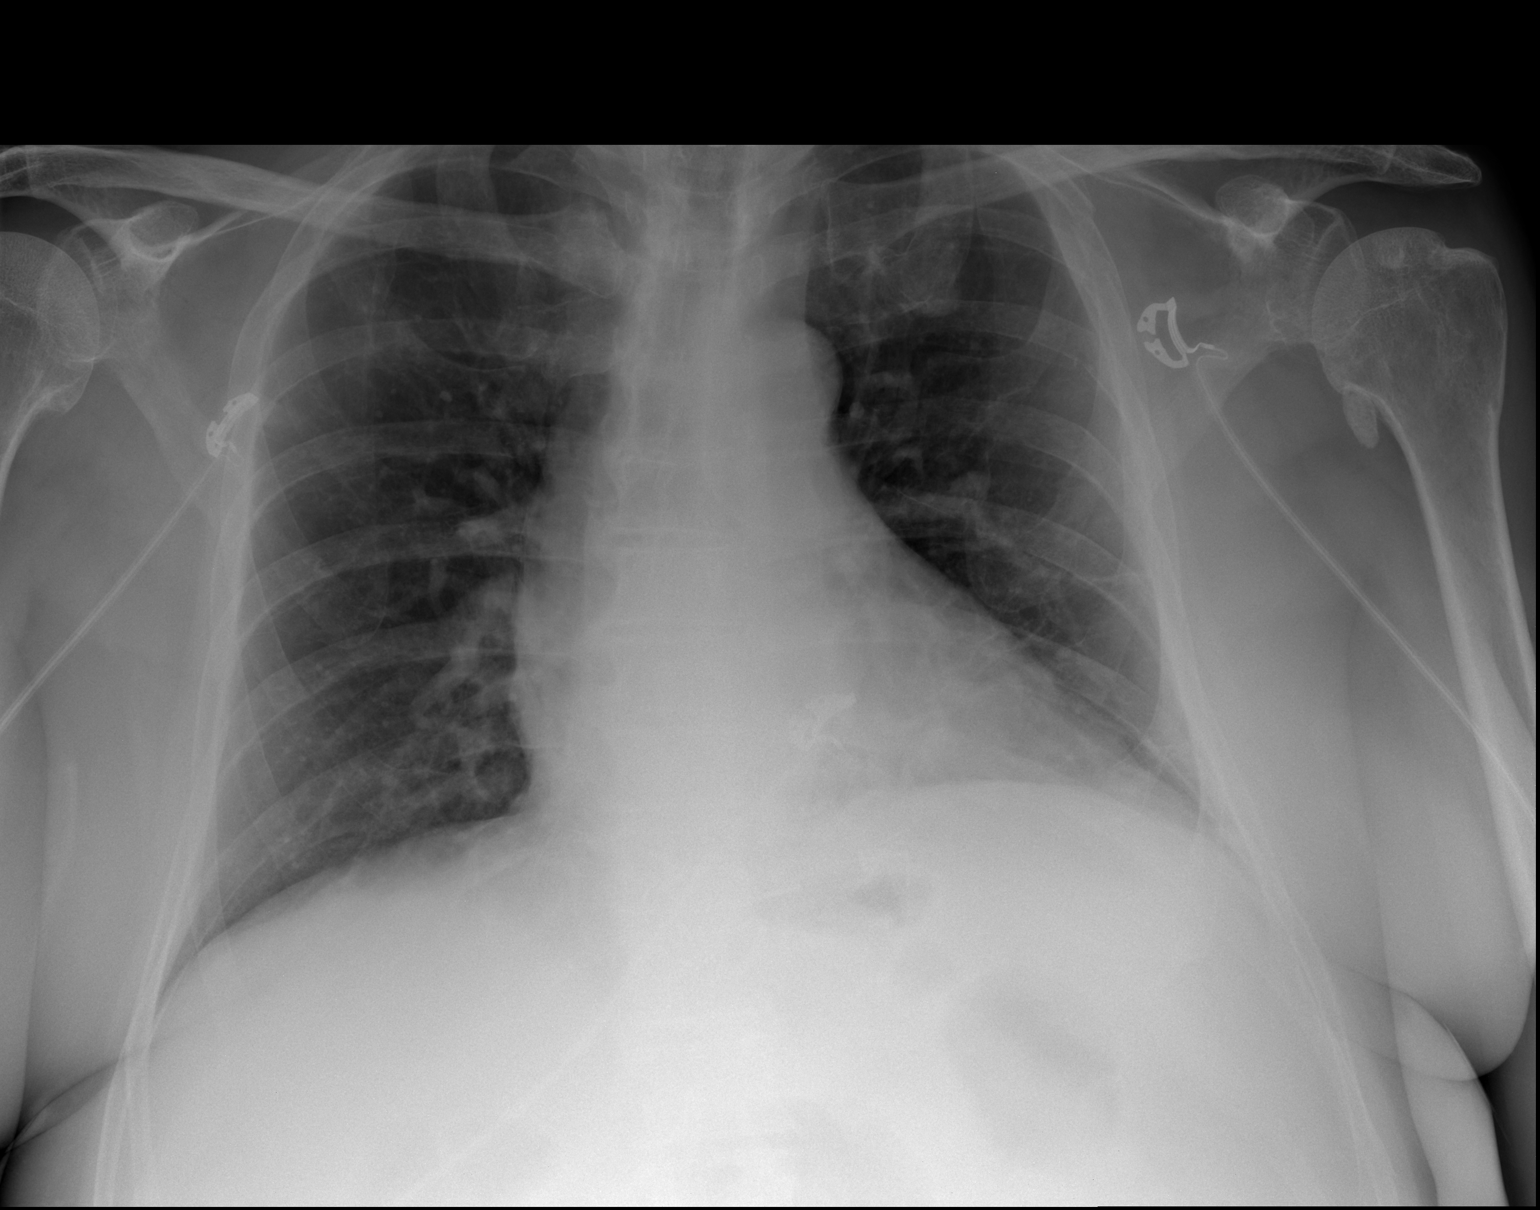
[im 2/2]
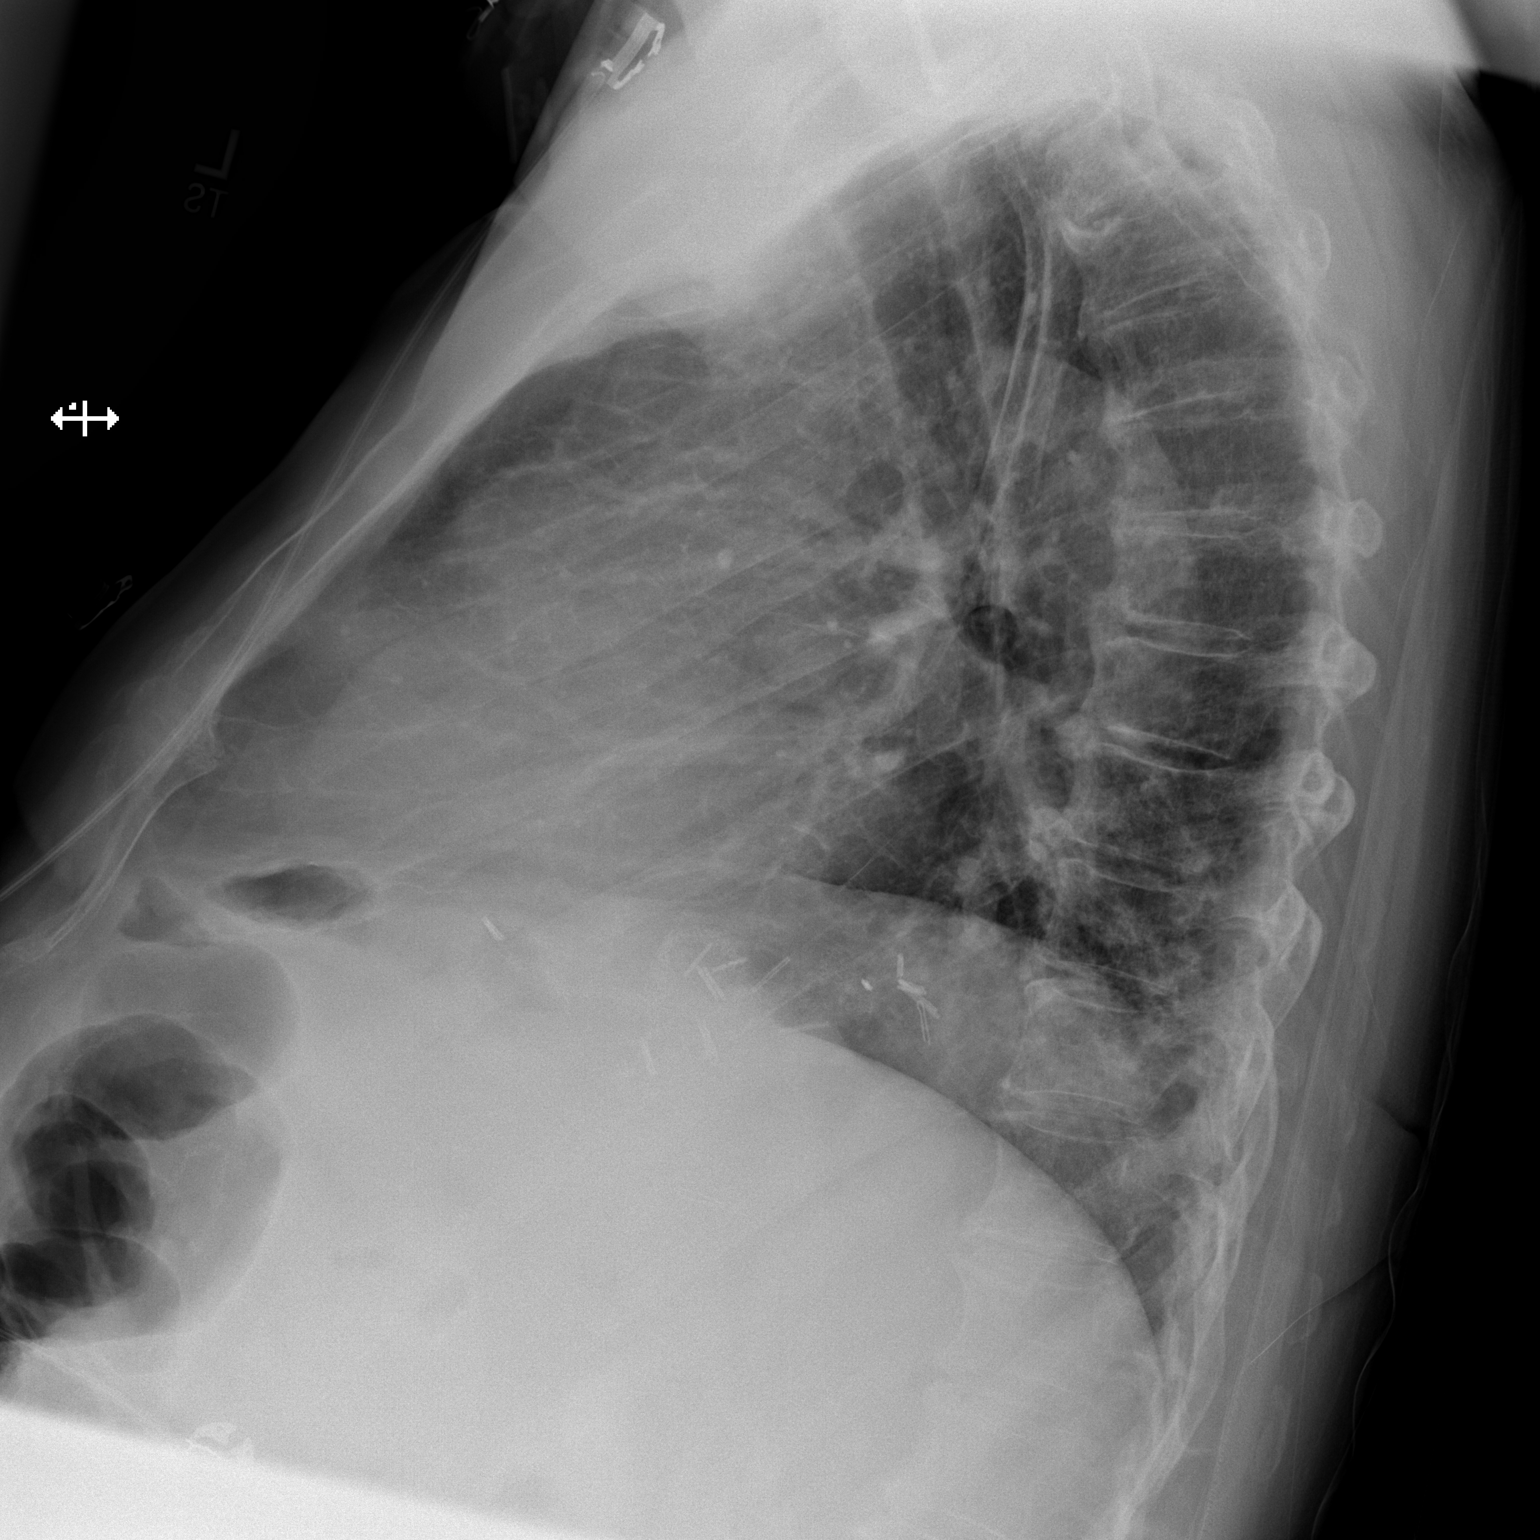

[2 of 2 positions shown; findings below may reference images not displayed]

FINDINGS: There are fractures of the left second through fifth ribs,
moderately displaced. No pneumothorax is evident. No effusion is
evident. Lungs are clear. Mediastinal contours are normal.
IMPRESSION: Fractures of the left second through fifth ribs.

## 2016-07-30 IMAGING — CT CT CERVICAL SPINE W/O CM
2 of 5 series · 4 of 14 positions shown, 5 images · non-contrast
Comparison: 08/08/2014

CLINICAL DATA: Fell from a standing position at [DATE]. Laceration
to the top of the head. Rib fractures.

EXAM:
CT HEAD WITHOUT CONTRAST
CT CERVICAL SPINE WITHOUT CONTRAST
TECHNIQUE: Multidetector CT imaging of the head and cervical spine was
performed following the standard protocol without intravenous
contrast. Multiplanar CT image reconstructions of the cervical spine
were also generated.

[Series 3: head bone · axial · 0.41mm/px · z∈[-19,+45]mm · 2 of 96 slices shown]
[im 32/96  bone]
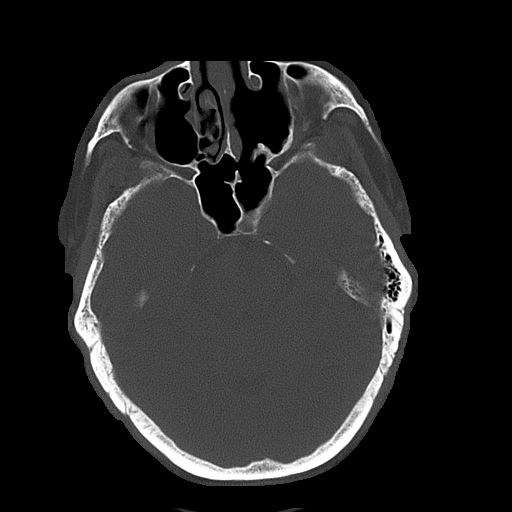
[im 64/96  bone]
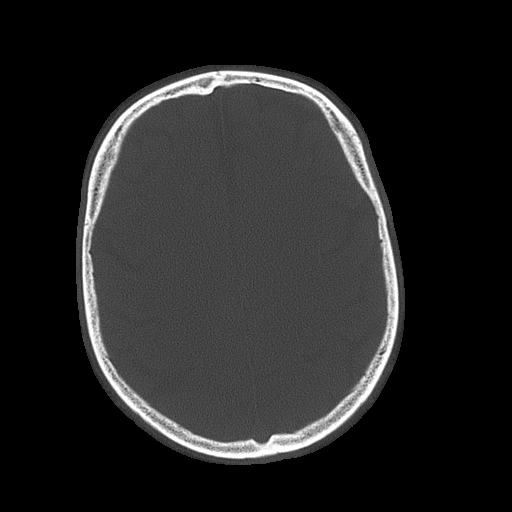

[Series 10: orthogonal axials · axial · 0.33mm/px · z∈[-160,-101]mm · 2 of 92 slices shown, 3 images]
[im 31/92  soft-tissue]
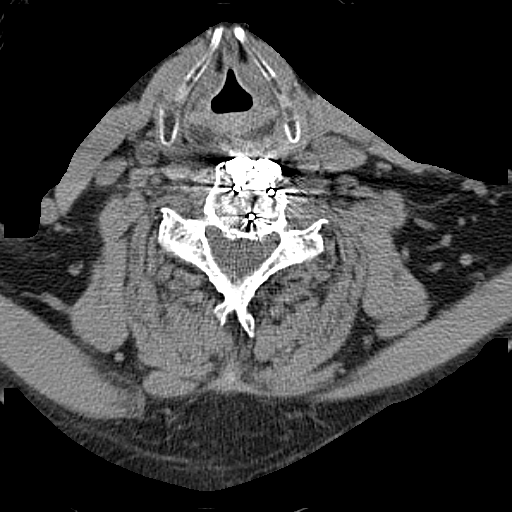
[im 31/92  bone]
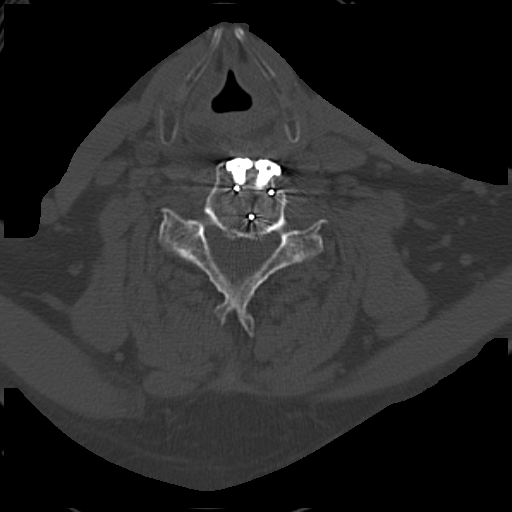
[im 61/92  bone]
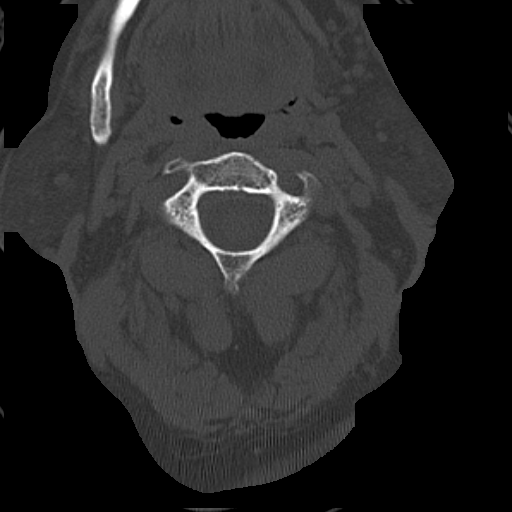

[4 of 14 positions shown; findings below may reference images not displayed]

FINDINGS: CT HEAD FINDINGS

There is no intracranial hemorrhage, mass or evidence of acute
infarction. There is mild generalized atrophy. There is mild chronic
microvascular ischemic change. There is no significant extra-axial
fluid collection.

No acute intracranial findings are evident. Calvarium and skullbase
are intact. There is a right mastoid effusion. This is not
significantly different from 06/05/2014

Visible paranasal sinuses are clear.

CT CERVICAL SPINE FINDINGS

There are intact appearances of the anterior fixation hardware from
C3 through C7 with a solidly ossified interbody fusions. There is
unchanged mild spondylolisthesis at C7-T1. The pedicles and facet
articulations are intact. There is no evidence of acute fracture.
There is no acute soft tissue abnormality.
IMPRESSION: 1. Negative for acute intracranial traumatic injury. There is
generalized atrophy and mild chronic microvascular ischemic change.
2. Negative for acute cervical spine fracture. There is solidly
ossified interbody fusion from C3 through C7 with anterior fixation
hardware.
3. Right mastoid effusion, unchanged from 06/05/2014.

## 2016-08-16 ENCOUNTER — Other Ambulatory Visit: Payer: Self-pay | Admitting: Pain Medicine

## 2016-08-16 ENCOUNTER — Other Ambulatory Visit: Payer: Self-pay

## 2016-08-16 DIAGNOSIS — M461 Sacroiliitis, not elsewhere classified: Secondary | ICD-10-CM | POA: Diagnosis not present

## 2016-08-16 DIAGNOSIS — M5416 Radiculopathy, lumbar region: Secondary | ICD-10-CM | POA: Diagnosis not present

## 2016-08-16 DIAGNOSIS — M47817 Spondylosis without myelopathy or radiculopathy, lumbosacral region: Secondary | ICD-10-CM | POA: Diagnosis not present

## 2016-08-16 DIAGNOSIS — M791 Myalgia: Secondary | ICD-10-CM | POA: Diagnosis not present

## 2016-08-16 MED ORDER — DULOXETINE HCL 60 MG PO CPEP: 60.0000 mg | ORAL_CAPSULE | Freq: Every day | ORAL | 5 refills | Status: DC

## 2016-08-16 NOTE — Telephone Encounter (Signed)
Sent to pharmacy 

## 2016-08-16 NOTE — Telephone Encounter (Signed)
Last filled by Dr.Walker 02/26/16 30 5rf

## 2016-08-17 ENCOUNTER — Telehealth: Payer: Self-pay | Admitting: *Deleted

## 2016-08-17 DIAGNOSIS — G894 Chronic pain syndrome: Secondary | ICD-10-CM

## 2016-08-17 NOTE — Telephone Encounter (Signed)
Patient has requested to have his pain management care referral transferred to [facility] contact [phone]

## 2016-08-17 NOTE — Telephone Encounter (Signed)
Referral placed.

## 2016-08-17 NOTE — Telephone Encounter (Signed)
I do not see where we have referred him in the past, please advise

## 2016-08-20 ENCOUNTER — Telehealth: Payer: Self-pay | Admitting: Family Medicine

## 2016-08-20 ENCOUNTER — Emergency Department
Admission: EM | Admit: 2016-08-20 | Discharge: 2016-08-20 | Disposition: A | Discharge status: Home / Self Care | Payer: Medicare Other | Attending: Emergency Medicine | Admitting: Emergency Medicine

## 2016-08-20 ENCOUNTER — Emergency Department: Payer: Medicare Other

## 2016-08-20 ENCOUNTER — Encounter: Payer: Self-pay | Admitting: Emergency Medicine

## 2016-08-20 DIAGNOSIS — E1122 Type 2 diabetes mellitus with diabetic chronic kidney disease: Secondary | ICD-10-CM | POA: Insufficient documentation

## 2016-08-20 DIAGNOSIS — I509 Heart failure, unspecified: Secondary | ICD-10-CM | POA: Diagnosis not present

## 2016-08-20 DIAGNOSIS — N189 Chronic kidney disease, unspecified: Secondary | ICD-10-CM | POA: Diagnosis not present

## 2016-08-20 DIAGNOSIS — R0789 Other chest pain: Secondary | ICD-10-CM | POA: Diagnosis present

## 2016-08-20 DIAGNOSIS — I130 Hypertensive heart and chronic kidney disease with heart failure and stage 1 through stage 4 chronic kidney disease, or unspecified chronic kidney disease: Secondary | ICD-10-CM | POA: Insufficient documentation

## 2016-08-20 LAB — CBC
HCT: 40.7 % (ref 40.0–52.0)
Hemoglobin: 13.8 g/dL (ref 13.0–18.0)
MCH: 33.9 pg (ref 26.0–34.0)
MCHC: 33.9 g/dL (ref 32.0–36.0)
MCV: 99.9 fL (ref 80.0–100.0)
PLATELETS: 199 10*3/uL (ref 150–440)
RBC: 4.07 MIL/uL — ABNORMAL LOW (ref 4.40–5.90)
RDW: 13.8 % (ref 11.5–14.5)
WBC: 9.7 10*3/uL (ref 3.8–10.6)

## 2016-08-20 LAB — BASIC METABOLIC PANEL
Anion gap: 7 (ref 5–15)
BUN: 17 mg/dL (ref 6–20)
CALCIUM: 9.2 mg/dL (ref 8.9–10.3)
CO2: 27 mmol/L (ref 22–32)
Chloride: 106 mmol/L (ref 101–111)
Creatinine, Ser: 0.95 mg/dL (ref 0.61–1.24)
GFR calc non Af Amer: >60 mL/min (ref >60)
Glucose, Bld: 128 mg/dL — ABNORMAL HIGH (ref 65–99)
Potassium: 4.2 mmol/L (ref 3.5–5.1)
SODIUM: 140 mmol/L (ref 135–145)

## 2016-08-20 LAB — TROPONIN I

## 2016-08-20 NOTE — ED Provider Notes (Signed)
[facility] Emergency Department Provider Note   ____________________________________________   First MD Initiated Contact with Patient 08/20/16 1451     (approximate)  I have reviewed the triage vital signs and the nursing notes.   HISTORY  Chief Complaint Chest Pain    HPI Justin Patel is a 66 y.o. male who reports he was coughing a lot and bringing up a lot of mucus E phlegm. His doctor gave him some antibiotics that got better however now his lower left chest where he had rib fractures is hurting and it continues to hurt. Pain is reproduced by palpation. He is not short of breath he does not have any particular pain with exercising or deep breathing. Pain is moderate patient suggested he take his Ultram for his low back pain to help with this chest pain. Patient also complains of itchiness in his back and looked at his back and did not see any rash. Ask him to come back if he develops a rash or see his doctor.   Past Medical History:  Diagnosis Date  . 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 Anxiety   . Arthritis   . 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  removal scheduled to clear margins.  . CHF (congestive heart failure) ([place])   . Chronic kidney disease   . Degenerative disc disease   . Diabetes mellitus    no longer diabetic-or on meds  . Gastric ulcer   . GERD (gastroesophageal reflux disease)   . H/O hiatal hernia   . Headache(784.0)   . Heart murmur   . Hx of congestive heart failure   . Hyperlipidemia   . Hypertension   . Iron deficiency anemia 02/28/2015  . Sacral fracture, closed ([place])   . Sleep apnea    hx not now since wt loss  . Stones in the urinary tract   . Syncope and collapse     Patient Active Problem List   Diagnosis Date Noted  . Sinusitis, acute maxillary 07/29/2016  . Elevated glucose 06/25/2016  . Anxiety 06/25/2016  . Cervical facet syndrome 03/15/2016    . Facet syndrome, lumbar 03/15/2016  . DDD (degenerative disc disease), cervical 03/15/2016  . DDD (degenerative disc disease), lumbar 03/15/2016  . Diverticulitis 12/19/2015  . Other iron deficiency anemias 11/27/2015  . Bilateral lower extremity edema 11/12/2015  . Recurrent falls 07/25/2015  . Head injury 07/18/2015  . Multiple rib fractures 07/18/2015  . Opiate abuse, continuous 06/20/2015  . Chronic pain syndrome 04/16/2015  . Alcohol abuse 10/01/2014  . SDH (subdural hematoma) ([place]) 06/03/2014  . 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 Benign prostatic hyperplasia with urinary obstruction 11/16/2013  . Enlarged prostate 11/08/2013  . Anemia 11/08/2013  . H/O urinary stone 10/05/2013  . Renal cyst 08/27/2013  . Diabetes ([place]) 03/21/2013  . Essential hypertension, benign 03/21/2013  . Hyperlipidemia 03/21/2013  . Obesity 03/21/2013    Past Surgical History:  Procedure Laterality Date  . ANTERIOR CERVICAL DECOMP/DISCECTOMY FUSION  01/26/2012   Procedure: ANTERIOR CERVICAL DECOMPRESSION/DISCECTOMY FUSION 3 LEVELS;  Surgeon: Floyce Stakes, MD;  Location: [facility];  Service: Neurosurgery;  Laterality: N/A;  Cervical three-four Cervical four-five Cervical five-six Cervical six-seven , Anterior cervical decompression/diskectomy, fusion, plate  . APPENDECTOMY    . [place], normal  . CARDIAC CATHETERIZATION     [place]  . GASTRIC  BYPASS  2010   [employer]  . HERNIA REPAIR  2000   hiatal  . JOINT REPLACEMENT    . KNEE ARTHROSCOPY     bilateral, left x 2  . NASAL SINUS SURGERY     x5  . OSTEOTOMY  2001   left  . TONSILLECTOMY    . TOTAL KNEE ARTHROPLASTY Left 05/2014   Dr. Marry Guan  . UVULOPALATOPLASTY  2011    Prior to Admission medications   Medication Sig Start Date End Date Taking? Authorizing Provider  acetaminophen (TYLENOL) 500 MG tablet Take 1,000 mg by mouth every 6 (six) hours as needed.    Historical  Provider, MD  amLODipine (NORVASC) 5 MG tablet Take 1 tablet (5 mg total) by mouth daily. 07/08/16   [name], MD  blood glucose meter kit and supplies KIT Dispense based on patient and insurance preference. Check once daily. ICD doing E11.9. 06/30/16   [name], MD  chlorpheniramine-HYDROcodone [facility] PENNKINETIC ER) 10-8 MG/5ML SUER Take 5 mLs by mouth every 12 (twelve) hours as needed for cough. 07/29/16   [name], MD  cyclobenzaprine (FLEXERIL) 5 MG tablet take 1 tablet by mouth three times a day if needed for muscle spasm 04/28/16   [name], MD  DULoxetine (CYMBALTA) 60 MG capsule Take 1 capsule (60 mg total) by mouth daily. 08/16/16   [name], MD  furosemide (LASIX) 20 MG tablet Take 1 tablet (20 mg total) by mouth daily. Patient taking differently: Take 20 mg by mouth as needed.  11/12/15   Jackolyn Confer, MD  glipiZIDE (GLUCOTROL) 5 MG tablet Take 1 tablet (5 mg total) by mouth daily before breakfast. 06/30/16   [name], MD  LamoTRIgine (LAMICTAL XR) 100 MG TB24 Take 1 tablet (100 mg total) by mouth daily. 07/09/16   Pieter Partridge, DO  LamoTRIgine (LAMICTAL XR) 25 MG TB24 tablet Take 1 tablet x 14 days, then take 2 tablets daily x 14 days, 06/07/16   Pieter Partridge, DO  levETIRAcetam (KEPPRA) 500 MG tablet take 1 tablet by mouth twice a day 04/20/16   Pieter Partridge, DO  losartan (COZAAR) 100 MG tablet Take 1 tablet (100 mg total) by mouth daily. 03/30/16   [name], MD  metoprolol succinate (TOPROL-XL) 50 MG 24 hr tablet Take 1 tablet (50 mg total) by mouth daily. Take with or immediately following a meal. 04/22/16   Wellington Hampshire, MD  NEXIUM 40 MG capsule take 1 capsule by mouth once daily 09/02/13   Jackolyn Confer, MD  rosuvastatin (CRESTOR) 10 MG tablet take 1 tablet by mouth once daily 01/07/16   Jackolyn Confer, MD  sucralfate (CARAFATE) 1 g tablet take 1 tablet by mouth four times a day 02/26/16   Jackolyn Confer, MD    tamsulosin (FLOMAX) 0.4 MG CAPS capsule Take 1 capsule (0.4 mg total) by mouth daily. 06/14/16   [name], MD  traMADol Veatrice Bourbon) 50 MG tablet Limit 1-2 tablets by mouth 2-3 times per day if tolerated 04/14/16   Mohammed Kindle, MD  VITAMIN D, CHOLECALCIFEROL, PO Take 1,000 mg by mouth daily.     Historical Provider, MD    Allergies Altace [ramipril]; Levaquin [levofloxacin in d5w]; Lyrica [pregabalin]; Metformin; and Iohexol  Family History  Problem Relation Age of Onset  . Dementia Mother 60  . Heart disease Father 36  . Diabetes Father   . Lymphoma Sister  lymphoma, stage 4  . Ovarian cancer Sister 47    Ovarian  . Lupus Sister     Social History Social History  Substance Use Topics  . Smoking status: Never Smoker  . Smokeless tobacco: Never Used  . Alcohol use No     Comment: stopped 66month ago    Review of Systems Constitutional: No fever/chills Eyes: No visual changes. ENT: No sore throat. Cardiovascular:See history of present illness Respiratory: Denies shortness of breath. Gastrointestinal: No abdominal pain.  No nausea, no vomiting.  No diarrhea.  No constipation. Genitourinary: Negative for dysuria. Musculoskeletal: Negative for back pain. Skin: Negative for rash. Neurological: Negative for headaches, focal weakness or numbness.  10-point ROS otherwise negative.  ____________________________________________   PHYSICAL EXAM:  VITAL SIGNS: ED Triage Vitals  Enc Vitals Group     BP 08/20/16 1109 122/71     Pulse Rate 08/20/16 1109 70     Resp 08/20/16 1109 20     Temp 08/20/16 1109 97.9 F (36.6 C)     Temp Source 08/20/16 1109 Oral     SpO2 08/20/16 1109 96 %     Weight 08/20/16 1106 231 lb (104.8 kg)     Height --      Head Circumference --      Peak Flow --      Pain Score 08/20/16 1106 7     Pain Loc --      Pain Edu? --      [place] --     Constitutional: Alert and oriented. Well appearing and in no acute distress. Eyes:  Conjunctivae are normal. PERRL. EOMI. Head: Atraumatic. Nose: No congestion/rhinnorhea. Mouth/Throat: Mucous membranes are moist.  Oropharynx non-erythematous. Neck: No stridor.   Cardiovascular: Normal rate, regular rhythm. Grossly normal heart sounds.  Good peripheral circulation. Chest wall pain is reproduced exactly by palpation on the left lower ribs just above the costochondral junction. Respiratory: Normal respiratory effort.  No retractions. Lungs CTAB. Gastrointestinal: Soft and nontender. No distention. No abdominal bruits. No CVA tenderness. Musculoskeletal: No lower extremity tenderness nor edema.  No joint effusions. Neurologic:  Normal speech and language. No gross focal neurologic deficits are appreciated. No gait instability. Skin:  Skin is warm, dry and intact. No rash noted.   ____________________________________________   LABS (all labs ordered are listed, but only abnormal results are displayed)  Labs Reviewed  BASIC METABOLIC PANEL - Abnormal; Notable for the following:       Result Value   Glucose, Bld 128 (*)    All other components within normal limits  CBC - Abnormal; Notable for the following:    RBC 4.07 (*)    All other components within normal limits  TROPONIN I   ____________________________________________  EKG  EKG read and interpreted by me shows normal sinus rhythm rate of 70 normal axis with prominent U waves in V1 through 3.    ____________________________________________  RADIOLOGY  Study Result   CLINICAL DATA:  Chest pain  EXAM: CHEST  2 VIEW  COMPARISON:  07/09/2015  FINDINGS: Heart and mediastinal contours are within normal limits. No focal opacities or effusions. No acute bony abnormality. Old left rib fractures noted.  IMPRESSION: No active cardiopulmonary disease.   Electronically Signed   By: KRolm BaptiseM.D.   On: 08/20/2016 11:27      ____________________________________________   PROCEDURES  Procedure(s) performed:   Procedures  Critical Care performed:  ____________________________________________   INITIAL IMPRESSION / ASSESSMENT AND PLAN / ED  COURSE  Pertinent labs & imaging results that were available during my care of the patient were reviewed by me and considered in my medical decision making (see chart for details).    Clinical Course      ____________________________________________   FINAL CLINICAL IMPRESSION(S) / ED DIAGNOSES  Final diagnoses:  Chest wall pain      NEW MEDICATIONS STARTED DURING THIS VISIT:  New Prescriptions   No medications on file     Note:  This document was prepared using Dragon voice recognition software and may include unintentional dictation errors.    Nena Polio, MD 08/20/16 [id number]

## 2016-08-20 NOTE — Telephone Encounter (Signed)
Pt called and stated he saw Dr.Sonnenberg last week for congestion and passed on a chest xray. Pt would like to know if he can come in for the xray he feels that he might have cracked a rib or something. Please advise, thank you!  Call pt @ [phone]

## 2016-08-20 NOTE — Discharge Instructions (Signed)
Please return if your chest pain worsens or changes at all. Please return if he becomes short of breath. Otherwise the tramadol and some Tylenol or Motrin should help.

## 2016-08-20 NOTE — ED Notes (Signed)
ED Provider at bedside. 

## 2016-08-20 NOTE — Telephone Encounter (Signed)
Reason for call:left side pain , soreness Symptoms:left side pain, arm pain, cough with production, chest congestion, no fever, left side pain  Duration  Symptoms got better but cough has persisted since 07/29/16, he wants to come in for chest x-ray  Medications: finished Doxycyline no OTC cough meds Last seen for this problem: 07/29/2016 Seen by:Dr Caryl Bis No appointments at [facility], urged patient to go for evaluation, urged patient to go to urgent care , but wants to come in for chest xray  please advise.

## 2016-08-20 NOTE — ED Triage Notes (Signed)
Pt to ed with c/o chest pain constant under left breast worse with left arm movement and worse with palpation of left side of chest x 2 weeks.

## 2016-08-20 NOTE — Telephone Encounter (Signed)
Patient advised he is going to [facility] walk in clinic for evaluation.

## 2016-08-20 NOTE — Telephone Encounter (Signed)
Given patient's complaints of left side pain and arm pain which he did not have previously he should be evaluated for this. I would suggest the walk-in clinic as we have no available appointments at this time.

## 2016-08-27 ENCOUNTER — Telehealth: Payer: Self-pay | Admitting: *Deleted

## 2016-08-27 DIAGNOSIS — M5136 Other intervertebral disc degeneration, lumbar region: Secondary | ICD-10-CM

## 2016-08-27 NOTE — Telephone Encounter (Signed)
Patient requested to be referred to a provider in the [place] area who can give forceps block and epidural block facat injections.   Pt contact [phone]

## 2016-08-27 NOTE — Telephone Encounter (Signed)
Referral to physical medicine and rehabilitation at [facility] placed.

## 2016-08-27 NOTE — Telephone Encounter (Signed)
Please advise 

## 2016-09-15 ENCOUNTER — Other Ambulatory Visit: Payer: Self-pay | Admitting: Family Medicine

## 2016-09-27 ENCOUNTER — Ambulatory Visit (INDEPENDENT_AMBULATORY_CARE_PROVIDER_SITE_OTHER): Payer: Medicare Other | Admitting: Family Medicine

## 2016-09-27 ENCOUNTER — Encounter: Payer: Self-pay | Admitting: Family Medicine

## 2016-09-27 DIAGNOSIS — E119 Type 2 diabetes mellitus without complications: Secondary | ICD-10-CM

## 2016-09-27 DIAGNOSIS — R000 Tachycardia, unspecified: Secondary | ICD-10-CM | POA: Diagnosis not present

## 2016-09-27 DIAGNOSIS — M5136 Other intervertebral disc degeneration, lumbar region: Secondary | ICD-10-CM

## 2016-09-27 DIAGNOSIS — I10 Essential (primary) hypertension: Secondary | ICD-10-CM | POA: Diagnosis not present

## 2016-09-27 DIAGNOSIS — E785 Hyperlipidemia, unspecified: Secondary | ICD-10-CM

## 2016-09-27 LAB — COMPREHENSIVE METABOLIC PANEL
ALK PHOS: 69 U/L (ref 39–117)
ALT: 21 U/L (ref 0–53)
AST: 17 U/L (ref 0–37)
Albumin: 4.3 g/dL (ref 3.5–5.2)
BILIRUBIN TOTAL: 0.3 mg/dL (ref 0.2–1.2)
BUN: 18 mg/dL (ref 6–23)
CO2: 26 mEq/L (ref 19–32)
CREATININE: 1.39 mg/dL (ref 0.40–1.50)
Calcium: 9.4 mg/dL (ref 8.4–10.5)
Chloride: 105 mEq/L (ref 96–112)
GFR: 54.4 mL/min — ABNORMAL LOW (ref >60.00)
GLUCOSE: 164 mg/dL — AB (ref 70–99)
Potassium: 4 mEq/L (ref 3.5–5.1)
SODIUM: 139 meq/L (ref 135–145)
TOTAL PROTEIN: 7.2 g/dL (ref 6.0–8.3)

## 2016-09-27 LAB — CBC
HCT: 46.6 % (ref 39.0–52.0)
Hemoglobin: 15.3 g/dL (ref 13.0–17.0)
MCHC: 32.9 g/dL (ref 30.0–36.0)
MCV: 99.2 fl (ref 78.0–100.0)
Platelets: 224 10*3/uL (ref 150.0–400.0)
RBC: 4.69 Mil/uL (ref 4.22–5.81)
RDW: 13.6 % (ref 11.5–15.5)
WBC: 11.5 10*3/uL — AB (ref 4.0–10.5)

## 2016-09-27 LAB — POCT GLYCOSYLATED HEMOGLOBIN (HGB A1C): Hemoglobin A1C: 6.7

## 2016-09-27 NOTE — Assessment & Plan Note (Signed)
Tolerating Crestor 

## 2016-09-27 NOTE — Progress Notes (Signed)
Pre visit review using our clinic review tool, if applicable. No additional management support is needed unless otherwise documented below in the visit note. 

## 2016-09-27 NOTE — Patient Instructions (Signed)
Nice to see you. We will check some additional lab work today. Please keep your appointment with pain management for your back. If you develop numbness, weakness, loss of bowel or bladder function, numbness between her legs, palpitations, chest pain, shortness of breath, or any new or change in symptoms please seek medical attention medially.

## 2016-09-27 NOTE — Progress Notes (Signed)
  [name], MD Phone: [phone]  Justin Patel is a 66 y.o. male who presents today for follow-up.  HYPERTENSION Disease Monitoring: Blood pressure range-120/80-90 Chest pain, palpitations- no      Dyspnea- no Medications: Compliance- taking amlodipine, Lasix, losartan, metoprolol   Edema- no  DIABETES Disease Monitoring: Blood Sugar ranges-80-90 Polyuria/phagia/dipsia- no      Medications: Compliance- taking glipizide Hypoglycemic symptoms- had one episode where it was 69. His rarely had some sweats since starting on glipizide. He's been decreasing his doughnuts and drinking more water. Also eating healthier.  HYPERLIPIDEMIA Disease Monitoring: See symptoms for Hypertension Medications: Compliance- taking Crestor Right upper quadrant pain- no  Muscle aches- no  Patient notes chronic low back pain. Has spinal stenosis. Follows with pain management and takes tramadol. Is going to see them in a couple of weeks and get set up for injections. He notes numbness down the back of his legs. Occasionally his feet will feel weak if he stands uncomfortably long. Notes no saddle anesthesia, loss of bowel or bladder function, or fevers.  Noted to be tachycardic on exam today and on vital signs. Patient reports a history of A. fib in the past. He also reports a history of a GI bleed. He notes no bleeding now. He's been taking all his medicines as prescribed.  PMH: nonsmoker.   ROS see history of present illness  Objective  Physical Exam Vitals:   09/27/16 1320 09/27/16 1425  BP: 102/84   Pulse: (!) 114 (!) 110  Temp: 98.5 F (36.9 C)     BP Readings from Last 3 Encounters:  09/27/16 102/84  08/20/16 117/74  07/29/16 134/80   Wt Readings from Last 3 Encounters:  09/27/16 214 lb 12.8 oz (97.4 kg)  08/20/16 231 lb (104.8 kg)  07/29/16 231 lb 3.2 oz (104.9 kg)    Physical Exam  Constitutional: No distress.  Cardiovascular: Regular rhythm and normal heart sounds.   Tachycardia present.   Pulmonary/Chest: Effort normal and breath sounds normal.  Musculoskeletal: He exhibits no edema.  No midline spine tenderness, no midline spine step-off, no muscular back tenderness  Neurological: He is alert. Gait normal.  Skin: Skin is warm and dry. He is not diaphoretic.   EKG: Sinus tachycardia, rate 110, upright T waves in lead V1, flattened in lead 3, possibly with multiple morphologies to P waves  Assessment/Plan: Please see individual problem list.  Essential hypertension, benign At goal. Continue current medications.  Diabetes ([place]) A1c well controlled at 6.7. Continue glipizide and diet and exercise. He'll monitor for more frequent low sugars.  DDD (degenerative disc disease), lumbar Followed by pain management. Encouraged him to follow-up with them to consider injections. If injections are not beneficial could consider neurosurgery referral.  Hyperlipidemia Tolerating Crestor.  Tachycardia Tachycardic on exam and on vitals. Asymptomatic. Given his history of reported A. fib that was brought on by anemia from a GI bleed we will obtain an EKG. EKG with apparent sinus tachycardia though P waves do appear to have different morphologies which could indicate a multifocal atrial tachycardia. We will forward the EKG to the patient's cardiologist for review. Patient will continue to monitor his symptoms. We will check lab work as well as outlined below. Given return precautions.   Orders Placed This Encounter  Procedures  . CBC  . Comp Met (CMET)  . POCT HgB A1C  . EKG 12-Lead    [name], MD [place]

## 2016-09-27 NOTE — Assessment & Plan Note (Signed)
At goal. Continue current medications. 

## 2016-09-27 NOTE — Assessment & Plan Note (Signed)
A1c well controlled at 6.7. Continue glipizide and diet and exercise. He'll monitor for more frequent low sugars.

## 2016-09-27 NOTE — Assessment & Plan Note (Addendum)
Tachycardic on exam and on vitals. Asymptomatic. Given his history of reported A. fib that was brought on by anemia from a GI bleed we will obtain an EKG. EKG with apparent sinus tachycardia though P waves do appear to have different morphologies which could indicate a multifocal atrial tachycardia. We will forward the EKG to the patient's cardiologist for review. Patient will continue to monitor his symptoms. We will check lab work as well as outlined below. Given return precautions.

## 2016-09-27 NOTE — Assessment & Plan Note (Signed)
Followed by pain management. Encouraged him to follow-up with them to consider injections. If injections are not beneficial could consider neurosurgery referral.

## 2016-09-30 ENCOUNTER — Other Ambulatory Visit: Payer: Self-pay

## 2016-09-30 DIAGNOSIS — R600 Localized edema: Secondary | ICD-10-CM

## 2016-09-30 NOTE — Telephone Encounter (Signed)
Last OV 09/27/2016 last filled by Dr.Walker 11/12/15 30 3rf

## 2016-10-01 ENCOUNTER — Other Ambulatory Visit: Payer: Self-pay | Admitting: Family Medicine

## 2016-10-01 DIAGNOSIS — D72829 Elevated white blood cell count, unspecified: Secondary | ICD-10-CM

## 2016-10-01 DIAGNOSIS — N179 Acute kidney failure, unspecified: Secondary | ICD-10-CM

## 2016-10-01 MED ORDER — FUROSEMIDE 20 MG PO TABS
20.0000 mg | ORAL_TABLET | Freq: Every day | ORAL | 3 refills | Status: DC
Start: 2016-10-01 — End: 2017-01-21

## 2016-10-01 NOTE — Telephone Encounter (Signed)
Last OV 09/27/16 last filled 06/14/16 30 3rf

## 2016-10-05 ENCOUNTER — Other Ambulatory Visit (INDEPENDENT_AMBULATORY_CARE_PROVIDER_SITE_OTHER): Payer: Medicare Other

## 2016-10-05 DIAGNOSIS — N179 Acute kidney failure, unspecified: Secondary | ICD-10-CM

## 2016-10-05 DIAGNOSIS — D72829 Elevated white blood cell count, unspecified: Secondary | ICD-10-CM | POA: Diagnosis not present

## 2016-10-05 LAB — CBC
HCT: 47.3 % (ref 39.0–52.0)
Hemoglobin: 15.5 g/dL (ref 13.0–17.0)
MCHC: 32.8 g/dL (ref 30.0–36.0)
MCV: 100.3 fl — AB (ref 78.0–100.0)
PLATELETS: 324 10*3/uL (ref 150.0–400.0)
RBC: 4.71 Mil/uL (ref 4.22–5.81)
RDW: 14 % (ref 11.5–15.5)
WBC: 10.4 10*3/uL (ref 4.0–10.5)

## 2016-10-05 LAB — BASIC METABOLIC PANEL
BUN: 19 mg/dL (ref 6–23)
CALCIUM: 9.4 mg/dL (ref 8.4–10.5)
CO2: 22 meq/L (ref 19–32)
Chloride: 106 mEq/L (ref 96–112)
Creatinine, Ser: 1.23 mg/dL (ref 0.40–1.50)
GFR: 62.64 mL/min (ref >60.00)
Glucose, Bld: 212 mg/dL — ABNORMAL HIGH (ref 70–99)
POTASSIUM: 3.7 meq/L (ref 3.5–5.1)
SODIUM: 138 meq/L (ref 135–145)

## 2016-10-07 ENCOUNTER — Encounter: Payer: Self-pay | Admitting: Neurology

## 2016-10-07 ENCOUNTER — Ambulatory Visit (INDEPENDENT_AMBULATORY_CARE_PROVIDER_SITE_OTHER): Payer: Medicare Other | Admitting: Neurology

## 2016-10-07 VITALS — BP 128/88 | HR 95 | Ht 68.0 in | Wt 218.0 lb

## 2016-10-07 DIAGNOSIS — G40909 Epilepsy, unspecified, not intractable, without status epilepticus: Secondary | ICD-10-CM | POA: Diagnosis not present

## 2016-10-07 DIAGNOSIS — R251 Tremor, unspecified: Secondary | ICD-10-CM | POA: Diagnosis not present

## 2016-10-07 DIAGNOSIS — Z8679 Personal history of other diseases of the circulatory system: Secondary | ICD-10-CM | POA: Diagnosis not present

## 2016-10-07 MED ORDER — LAMOTRIGINE ER 50 MG PO TB24: 150.0000 mg | ORAL_TABLET | Freq: Every day | ORAL | 3 refills | Status: DC
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
## 2016-10-07 NOTE — Patient Instructions (Signed)
1.  Your episodes may be seizures, so I want to increase the Lamictal XR to 150mg  daily.  I will prescribe you 50mg  pills, so take 3 pills daily.  In 1 week, I want to check trough lamictal level.  2. Avoid activities in which a seizure would cause danger to yourself or to others.  Don't operate dangerous machinery, swim alone, or climb in high or dangerous places, such as on ladders, roofs, or girders.  Do not drive unless your doctor says you may.  3. If you have any warning that you may have a seizure, lay down in a safe place where you can't hurt yourself.    4.  No driving for 6 months from last seizure, as per [facility].   Please refer to the following link on the [place] website for more information: http://www.epilepsyfoundation.org/answerplace/Social/driving/drivingu.cfm   5.  Maintain good sleep hygiene.  6.  Notify your neurology if you are planning pregnancy or if you become pregnant.  7.  Contact your doctor if you have any problems that may be related to the medicine you are taking.  8.  Call 911 and bring the patient back to the ED if:        A.  The seizure lasts longer than 5 minutes.       B.  The patient doesn't awaken shortly after the seizure  C.  The patient has new problems such as difficulty seeing, speaking or moving  D.  The patient was injured during the seizure  E.  The patient has a temperature over 102 F (39C)  F.  The patient vomited and now is having trouble breathing       9.  Follow up in 3 months.

## 2016-10-11 DIAGNOSIS — Z79891 Long term (current) use of opiate analgesic: Secondary | ICD-10-CM | POA: Diagnosis not present

## 2016-10-11 DIAGNOSIS — G894 Chronic pain syndrome: Secondary | ICD-10-CM | POA: Diagnosis not present

## 2016-10-13 ENCOUNTER — Telehealth: Payer: Self-pay | Admitting: Radiology

## 2016-10-13 NOTE — Telephone Encounter (Signed)
Pt coming in for future labs on Friday, please place future orders. Thank you.

## 2016-10-14 ENCOUNTER — Other Ambulatory Visit: Payer: Medicare Other

## 2016-10-15 ENCOUNTER — Telehealth: Payer: Self-pay | Admitting: *Deleted

## 2016-10-15 ENCOUNTER — Other Ambulatory Visit (INDEPENDENT_AMBULATORY_CARE_PROVIDER_SITE_OTHER): Payer: Medicare Other

## 2016-10-15 DIAGNOSIS — Z5181 Encounter for therapeutic drug level monitoring: Secondary | ICD-10-CM

## 2016-10-15 DIAGNOSIS — G40909 Epilepsy, unspecified, not intractable, without status epilepticus: Secondary | ICD-10-CM | POA: Diagnosis not present

## 2016-10-15 NOTE — Telephone Encounter (Signed)
Lab re entered.

## 2016-10-15 NOTE — Telephone Encounter (Addendum)
I am unsure why he is coming in for labs again. I reviewed his most recent lab work and it appears he already had repeat lab work that was improved. Can you check with him to see why he is returning for labs? Thanks.

## 2016-10-15 NOTE — Telephone Encounter (Signed)
Please reorder patients Lamotrigine level as "fututre" so it can be released from our lab & sent to [facility].

## 2016-10-18 LAB — LAMOTRIGINE LEVEL: LAMOTRIGINE LVL: 2.7 ug/mL — AB (ref 4.0–18.0)

## 2016-10-19 ENCOUNTER — Other Ambulatory Visit: Payer: Self-pay | Admitting: Family Medicine

## 2016-10-19 NOTE — Telephone Encounter (Signed)
Last OV 09/27/16 last filled  04/28/16 90 5rf

## 2016-10-20 ENCOUNTER — Telehealth: Payer: Self-pay

## 2016-10-20 DIAGNOSIS — G40909 Epilepsy, unspecified, not intractable, without status epilepticus: Secondary | ICD-10-CM

## 2016-10-20 NOTE — Telephone Encounter (Signed)
Called patient. No answer. Will try later.  

## 2016-10-20 NOTE — Telephone Encounter (Signed)
-----   Message from Pieter Partridge, DO sent at 10/19/2016  1:02 PM EDT ----- Lamictal level is low.  Since he still has spells where he loses track of time or awareness, I would like to increase the Lamictal XR to 200mg  daily and recheck another trough level in one week.

## 2016-10-21 NOTE — Telephone Encounter (Signed)
Spoke to patient gave results and instructions. Patient verbalized understanding. Pt will call to schedule lab appt in 1 week after he starts the 200mg . Placed lab order.

## 2016-10-26 ENCOUNTER — Telehealth: Payer: Self-pay | Admitting: Neurology

## 2016-10-26 NOTE — Telephone Encounter (Signed)
VM-PT left a message regarding not having enough medication because Dr Tomi Likens upped the dosage/Dawn CB# [phone]

## 2016-10-27 MED ORDER — LAMOTRIGINE ER 200 MG PO TB24: 1.0000 | ORAL_TABLET | Freq: Every day | ORAL | 2 refills | Status: DC

## 2016-10-27 NOTE — Telephone Encounter (Signed)
Spoke to patient.  Advised sent LamoTRIgine XR (LAMICTAL XR) 200 MG Rx. Confirmed pharmacy on file.  Pt was grateful.

## 2016-10-29 ENCOUNTER — Other Ambulatory Visit: Payer: Medicare Other

## 2016-10-29 DIAGNOSIS — G40909 Epilepsy, unspecified, not intractable, without status epilepticus: Secondary | ICD-10-CM

## 2016-11-01 LAB — LAMOTRIGINE LEVEL: LAMOTRIGINE LVL: 3.4 ug/mL — AB (ref 4.0–18.0)

## 2016-11-03 ENCOUNTER — Telehealth: Payer: Self-pay

## 2016-11-03 DIAGNOSIS — G40909 Epilepsy, unspecified, not intractable, without status epilepticus: Secondary | ICD-10-CM

## 2016-11-03 NOTE — Telephone Encounter (Signed)
Called pt no answer. No DPR for numbers on file.

## 2016-11-03 NOTE — Telephone Encounter (Signed)
-----   Message from Pieter Partridge, DO sent at 11/03/2016  9:05 AM EDT ----- lamictal level is still low.  I would like to increase the Lamictal XR from 200mg  daily to 300mg  daily and repeat trough level in one week.

## 2016-11-08 NOTE — Telephone Encounter (Signed)
Called patient. No answer.  Spoke to spouse. Gave instructions per Dr. Georgie Chard previous note. Spouse verbalized understanding. Says she will inform patient.

## 2016-11-17 ENCOUNTER — Telehealth: Payer: Self-pay | Admitting: Neurology

## 2016-11-17 NOTE — Telephone Encounter (Signed)
I wouldn't discontinue it completely.  I would reduce dose of Lamictal XR back down to 200mg  daily.

## 2016-11-17 NOTE — Telephone Encounter (Signed)
Received a call from PT regarding medication: Lamictal  Patient needs a refill of medication. No  Patient having side effects from medication. Yes, PT states that since the increases in dose he has been feeling sick and throwing up, should he discontinue taking it  Patient calling to update [place] on medication. Yes

## 2016-11-17 NOTE — Telephone Encounter (Signed)
I spoke with patient and gave him instructions per Dr. Tomi Likens.  Patient agreed with plan and will keep follow up appointment in June.

## 2016-11-24 ENCOUNTER — Telehealth: Payer: Self-pay | Admitting: Family Medicine

## 2016-11-24 DIAGNOSIS — M48061 Spinal stenosis, lumbar region without neurogenic claudication: Secondary | ICD-10-CM

## 2016-11-24 NOTE — Telephone Encounter (Signed)
Pt lvm requesting a referral for spine surgery. Please advise.

## 2016-11-24 NOTE — Telephone Encounter (Signed)
Please find out what his specific diagnosis that requires the referral is and I will place the referral. Thanks.

## 2016-11-25 NOTE — Telephone Encounter (Signed)
Referral placed.

## 2016-11-25 NOTE — Telephone Encounter (Signed)
He is requesting referral because he has been under going pain management for over a year now and nothing is helping. He was doing the injections before dr. Primus Bravo moved to [facility] and now he is only writing rx's for pain meds-which also isn't working. He said the latest image of his back showed stenosis, scoliosis and DDD and now his lower back is hurting so bad that it now limites his movements and activities. He wants to go to [facility] in [place].

## 2016-12-09 ENCOUNTER — Other Ambulatory Visit: Payer: Self-pay

## 2016-12-09 NOTE — Telephone Encounter (Signed)
Last OV 09/27/16 last filled 01/07/16 by Dr.Walker

## 2016-12-10 MED ORDER — ROSUVASTATIN CALCIUM 10 MG PO TABS: 10.0000 mg | ORAL_TABLET | Freq: Every day | ORAL | 3 refills | Status: DC

## 2016-12-24 ENCOUNTER — Other Ambulatory Visit (HOSPITAL_COMMUNITY): Payer: Self-pay | Admitting: Neurosurgery

## 2016-12-24 ENCOUNTER — Other Ambulatory Visit: Payer: Self-pay | Admitting: Neurosurgery

## 2016-12-24 DIAGNOSIS — M4726 Other spondylosis with radiculopathy, lumbar region: Secondary | ICD-10-CM

## 2016-12-28 ENCOUNTER — Encounter: Payer: Self-pay | Admitting: Family Medicine

## 2016-12-28 ENCOUNTER — Ambulatory Visit (INDEPENDENT_AMBULATORY_CARE_PROVIDER_SITE_OTHER): Payer: Medicare Other | Admitting: Family Medicine

## 2016-12-28 VITALS — BP 130/90 | HR 91 | Temp 98.5°F | Wt 221.8 lb

## 2016-12-28 DIAGNOSIS — E119 Type 2 diabetes mellitus without complications: Secondary | ICD-10-CM | POA: Diagnosis not present

## 2016-12-28 DIAGNOSIS — M5136 Other intervertebral disc degeneration, lumbar region: Secondary | ICD-10-CM | POA: Diagnosis not present

## 2016-12-28 DIAGNOSIS — I10 Essential (primary) hypertension: Secondary | ICD-10-CM

## 2016-12-28 LAB — POCT GLYCOSYLATED HEMOGLOBIN (HGB A1C): Hemoglobin A1C: 7.1

## 2016-12-28 NOTE — Assessment & Plan Note (Signed)
Continues to have issues with this. He is going to undergo a myelogram next week. Neurologically intact in his lower extremities. Discussed discontinuing Flexeril and seeing if he is actually benefiting from it. He'll continue Cymbalta. Next step will depend on myelogram findings in neurosurgery evaluation.

## 2016-12-28 NOTE — Assessment & Plan Note (Signed)
Stable.  Continue current medications.

## 2016-12-28 NOTE — Patient Instructions (Signed)
Nice to see you. Please continue to monitor your blood pressure. We'll check an A1c today and contact you with results. Please discontinue the Flexeril. If you notice there was a big difference when taking this please restart it and let us know. Please see neurosurgery for your myelogram next week.

## 2016-12-28 NOTE — Assessment & Plan Note (Signed)
A1c near goal. Encourage dietary changes. Continue glipizide.

## 2016-12-28 NOTE — Progress Notes (Signed)
  Justin Rumps, MD Phone: [phone]  Justin Patel is a 66 y.o. male who presents today for f/u.  HYPERTENSION  Disease Monitoring  Home BP Monitoring 130/80s Chest pain- no    Dyspnea- no Medications  Compliance-  Taking amlodipine, Lasix, losartan, metoprolol.  Edema- no  Diabetes: 80-110. Taking glipizide. No polyuria or polydipsia. Rarely gets shaky and sweaty and eats something and this resolves.  Chronic low back pain: He is seeing neurosurgery now for this. He has not been taking tramadol over the last month or so. The Flexeril was not very beneficial either. He got a letter from his [facility] stating that they would not cover this any more. Cymbalta does help some. Tylenol helps the most. Does radiate down his legs. Occasionally his legs will feel weak if he stands for any period of time. No numbness. No incontinence. No saddle anesthesia. He has a myelogram scheduled for next week.  He reports an adverse reaction to increased dose of Lamictal that his neurologist placed him on. He was having issues with feeling as though his dreams are mixed with reality. They decreased the dose and he has not had any issues since then.   PMH: nonsmoker.   ROS see history of present illness  Objective  Physical Exam Vitals:   12/28/16 1319  BP: 130/90  Pulse: 91  Temp: 98.5 F (36.9 C)    BP Readings from Last 3 Encounters:  12/28/16 130/90  10/07/16 128/88  09/27/16 102/84   Wt Readings from Last 3 Encounters:  12/28/16 221 lb 12.8 oz (100.6 kg)  10/07/16 218 lb (98.9 kg)  09/27/16 214 lb 12.8 oz (97.4 kg)    Physical Exam  Constitutional: No distress.  Cardiovascular: Normal rate, regular rhythm and normal heart sounds.   Pulmonary/Chest: Effort normal and breath sounds normal.  Musculoskeletal: He exhibits no edema.  No midline spine tenderness, no midline spine step-off, there is mild tenderness over the SI joint in the left side of his back with no  overlying skin changes  Neurological: He is alert. Gait normal.  5 out of 5 strength bilateral quads, hamstrings, plantar flexion, and dorsiflexion, sensation to light touch intact in bilateral lower extremities  Skin: Skin is warm and dry. He is not diaphoretic.     Assessment/Plan: Please see individual problem list.  Essential hypertension, benign Stable. Continue current medications.  Diabetes ([place]) A1c near goal. Encourage dietary changes. Continue glipizide.  DDD (degenerative disc disease), lumbar Continues to have issues with this. He is going to undergo a myelogram next week. Neurologically intact in his lower extremities. Discussed discontinuing Flexeril and seeing if he is actually benefiting from it. He'll continue Cymbalta. Next step will depend on myelogram findings in neurosurgery evaluation.  Adverse symptoms from the increased dose of Lamictal have resolved after decreasing the dose. Encouraged to monitor for further symptoms. Encouraged to keep follow-up with neurology.  Orders Placed This Encounter  Procedures  . POCT HgB A1C    Justin Rumps, MD [place]

## 2017-01-05 ENCOUNTER — Ambulatory Visit (HOSPITAL_COMMUNITY)
Admission: RE | Admit: 2017-01-05 | Discharge: 2017-01-05 | Disposition: A | Discharge status: Home / Self Care | Payer: Medicare Other | Source: Ambulatory Visit | Attending: Neurosurgery | Admitting: Neurosurgery

## 2017-01-05 DIAGNOSIS — M5116 Intervertebral disc disorders with radiculopathy, lumbar region: Secondary | ICD-10-CM | POA: Insufficient documentation

## 2017-01-05 DIAGNOSIS — M1990 Unspecified osteoarthritis, unspecified site: Secondary | ICD-10-CM | POA: Diagnosis not present

## 2017-01-05 DIAGNOSIS — M48061 Spinal stenosis, lumbar region without neurogenic claudication: Secondary | ICD-10-CM | POA: Diagnosis not present

## 2017-01-05 DIAGNOSIS — I700 Atherosclerosis of aorta: Secondary | ICD-10-CM | POA: Diagnosis not present

## 2017-01-05 DIAGNOSIS — M4726 Other spondylosis with radiculopathy, lumbar region: Secondary | ICD-10-CM

## 2017-01-05 LAB — GLUCOSE, CAPILLARY: Glucose-Capillary: 112 mg/dL — ABNORMAL HIGH (ref 65–99)

## 2017-01-05 MED ORDER — ONDANSETRON HCL 4 MG/2ML IJ SOLN: 4.0000 mg | Freq: Four times a day (QID) | INTRAMUSCULAR | Status: DC | PRN

## 2017-01-05 MED ORDER — IOPAMIDOL (ISOVUE-M 200) INJECTION 41%
20.0000 mL | Freq: Once | INTRAMUSCULAR | Status: AC
Start: 2017-01-05 — End: 2017-01-05
  Administered 2017-01-05: 20 mL via INTRATHECAL

## 2017-01-05 MED ORDER — DIAZEPAM 5 MG PO TABS
ORAL_TABLET | ORAL | Status: AC
  Filled 2017-01-05: qty 2

## 2017-01-05 MED ORDER — LIDOCAINE HCL 1 % IJ SOLN
INTRAMUSCULAR | Status: AC
  Filled 2017-01-05: qty 10

## 2017-01-05 MED ORDER — LIDOCAINE HCL (PF) 1 % IJ SOLN
10.0000 mL | Freq: Once | INTRAMUSCULAR | Status: AC
  Administered 2017-01-05: 5 mL via INTRADERMAL

## 2017-01-05 MED ORDER — DIAZEPAM 5 MG PO TABS
10.0000 mg | ORAL_TABLET | Freq: Once | ORAL | Status: AC
  Administered 2017-01-05: 10 mg via ORAL

## 2017-01-05 MED ORDER — OXYCODONE HCL 5 MG PO TABS
5.0000 mg | ORAL_TABLET | ORAL | Status: DC | PRN
  Filled 2017-01-05: qty 2

## 2017-01-05 MED ORDER — IOPAMIDOL (ISOVUE-M 200) INJECTION 41%
INTRAMUSCULAR | Status: AC
  Administered 2017-01-05: 20 mL via INTRATHECAL
  Filled 2017-01-05: qty 10

## 2017-01-05 NOTE — Op Note (Signed)
  Lumbar Myelogram  PATIENT:  Justin Patel is a 66 y.o. male  PRE-OPERATIVE DIAGNOSIS:  Osteoarthritis with lumbar radiculopathy  POST-OPERATIVE DIAGNOSIS:  Osteoarthritis lumbar with radiculopathy  PROCEDURE:  Lumbar Myelogram  SURGEON:  Elvyn Krohn  ANESTHESIA:   local LOCAL MEDICATIONS USED:  LIDOCAINE  and Amount: 8 ml Procedure Note: Justin Patel is a 66 y.o. male Was taken to the fluoroscopy suite and  positioned prone on the fluoroscopy table. HIs back was prepared and draped in a sterile manner. I infiltrated 8 cc into the lumbar region. I then introduced a spinal needle into the thecal sac at the L3/4 interlaminar space. I infiltrated 20cc of Isovue 200 into the thecal sac. Fluoroscopy showed the needle and contrast in the thecal sac. Justin Patel tolerated the procedure well. he Will be taken to CT for evaluation.     PATIENT DISPOSITION:  PACU - hemodynamically stable.

## 2017-01-05 NOTE — Discharge Instructions (Signed)
Myelogram, Care After °These instructions give you information about caring for yourself after your procedure. Your doctor may also give you more specific instructions. Call your doctor if you have any problems or questions after your procedure. °Follow these instructions at home: °· Drink enough fluid to keep your pee (urine) clear or pale yellow. °· Rest as told by your doctor. °· Lie flat with your head slightly raised (elevated). °· Do not bend, lift, or do any hard activities for 24-48 hours or as told by your doctor. °· Take over-the-counter and prescription medicines only as told by your doctor. °· Take care of and remove your bandage (dressing) as told by your doctor. °· Bathe or shower as told by your doctor. °Contact a health care provider if: °· You have a fever. °· You have a headache that lasts longer than 24 hours. °· You feel sick to your stomach (nauseous). °· You throw up (vomit). °· Your neck is stiff. °· Your legs feel numb. °· You cannot pee. °· You cannot poop (have a bowel movement). °· You have a rash. °· You are itchy or sneezing. °Get help right away if: °· You have new symptoms or your symptoms get worse. °· You have a seizure. °· You have trouble breathing. °This information is not intended to replace advice given to you by your health care provider. Make sure you discuss any questions you have with your health care provider. °Document Released: 05/04/2008 Document Revised: 03/25/2016 Document Reviewed: 05/08/2015 °Elsevier Interactive Patient Education © 2017 [employer]vier Inc. ° °

## 2017-01-05 NOTE — Progress Notes (Signed)
Pt states he had a reaction to IVP dye in 1976.  States he has taken benedryl  prior to his radiology procedures since then that have included this dye and he has not had any problems since then.  Pt states that he took 50mg  of Benedryl at home at 0630 this am. Paged Dr. Christella Noa to see if he wanted any further treatment. Awaiting phone call

## 2017-01-07 ENCOUNTER — Ambulatory Visit: Payer: Medicare Other | Admitting: Neurology

## 2017-01-14 ENCOUNTER — Ambulatory Visit (INDEPENDENT_AMBULATORY_CARE_PROVIDER_SITE_OTHER): Payer: Medicare Other | Admitting: Neurology

## 2017-01-14 ENCOUNTER — Encounter: Payer: Self-pay | Admitting: Neurology

## 2017-01-14 VITALS — BP 110/70 | HR 78 | Ht 66.0 in | Wt 218.0 lb

## 2017-01-14 DIAGNOSIS — G40909 Epilepsy, unspecified, not intractable, without status epilepticus: Secondary | ICD-10-CM | POA: Diagnosis not present

## 2017-01-14 MED ORDER — LAMOTRIGINE ER 200 MG PO TB24: 1.0000 | ORAL_TABLET | Freq: Every day | ORAL | 5 refills | Status: DC
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
## 2017-01-21 ENCOUNTER — Other Ambulatory Visit: Payer: Self-pay | Admitting: Family Medicine

## 2017-01-21 DIAGNOSIS — R600 Localized edema: Secondary | ICD-10-CM

## 2017-02-01 ENCOUNTER — Other Ambulatory Visit: Payer: Self-pay | Admitting: Family Medicine

## 2017-02-01 ENCOUNTER — Other Ambulatory Visit: Payer: Self-pay

## 2017-02-01 MED ORDER — SUCRALFATE 1 G PO TABS: 1.0000 g | ORAL_TABLET | Freq: Four times a day (QID) | ORAL | 11 refills | Status: DC

## 2017-02-01 NOTE — Telephone Encounter (Signed)
Last Ov 12/28/16 last filled by DR.Walker 02/25/17

## 2017-02-11 ENCOUNTER — Telehealth: Payer: Self-pay | Admitting: Emergency Medicine

## 2017-02-11 NOTE — Telephone Encounter (Signed)
Pt called stating he is having a reaction to Lamictal.   He has red, raised itchy rash on abdomen, groin area and left leg. He also has had hair loss and he got a haircut today and noticed same bumps in his scalp area.   Advised patient to stop medication and he has benadryl at home for itching. Advised him to go to ER if symptoms get worse and have family watch for any seizures and document how long, etc.   Please advise any changes.

## 2017-02-12 ENCOUNTER — Other Ambulatory Visit: Payer: Self-pay | Admitting: Neurology

## 2017-02-12 MED ORDER — ZONISAMIDE 100 MG PO CAPS: 100.0000 mg | ORAL_CAPSULE | Freq: Every day | ORAL | 2 refills | Status: DC

## 2017-02-12 NOTE — Telephone Encounter (Signed)
Justin Patel contacted [place] yesterday when I was out of the office.  He was told to discontinue Lamictal.  I contacted him today to let him know that I sent a prescription to [facility] for zonisamide 100mg  daily, which I want him to pick up and start today.

## 2017-02-24 ENCOUNTER — Ambulatory Visit (INDEPENDENT_AMBULATORY_CARE_PROVIDER_SITE_OTHER): Payer: Medicare Other | Admitting: Family Medicine

## 2017-02-24 ENCOUNTER — Encounter: Payer: Self-pay | Admitting: Family Medicine

## 2017-02-24 DIAGNOSIS — L219 Seborrheic dermatitis, unspecified: Secondary | ICD-10-CM | POA: Diagnosis not present

## 2017-02-24 DIAGNOSIS — L304 Erythema intertrigo: Secondary | ICD-10-CM

## 2017-02-24 HISTORY — DX: Erythema intertrigo: L30.4

## 2017-02-24 MED ORDER — KETOCONAZOLE 2 % EX FOAM: CUTANEOUS | 0 refills | Status: DC

## 2017-02-24 MED ORDER — FLUCONAZOLE 150 MG PO TABS: 150.0000 mg | ORAL_TABLET | ORAL | 1 refills | Status: DC

## 2017-02-24 MED ORDER — CLOBETASOL PROPIONATE 0.05 % EX SHAM: MEDICATED_SHAMPOO | CUTANEOUS | 0 refills | Status: DC

## 2017-02-24 NOTE — Patient Instructions (Signed)
Medications as prescribed. ° °Take care ° °Dr. Mitul Hallowell  °

## 2017-02-24 NOTE — Assessment & Plan Note (Signed)
New problem. Treating with diflucan.

## 2017-02-24 NOTE — Progress Notes (Signed)
Subjective:  Patient ID: Justin Patel, male    DOB: [birth year]  Age: 66 y.o. MRN: [id number]  CC: Rash  HPI:  66 year old male with an extensive past medical history presents with the above complaint.  Patient reports a 2 month history of rash. Patient has 2 separate rashes. His scalp is dry and scaly. He has had some hair loss. No reports of scalp erythema. It is itchy.  Additionally, he has an erythematous and painful rash of his axilla, and groin. Moderate to severe at times. Significant pain particularly in the axilla. He has tried several over-the-counter treatments (hydrocortisone, topical antifungals, topical antibiotics) without improvement. No known inciting factor. No known exacerbating factors. No other associated symptoms. No other complaints at this time.  Social Hx   Social History   Social History  . Marital status: Married    Spouse name: N/A  . Number of children: N/A  . Years of education: N/A   Social History Main Topics  . Smoking status: Never Smoker  . Smokeless tobacco: Never Used  . Alcohol use No     Comment: stopped 18months ago  . Drug use: No  . Sexual activity: Yes    Partners: Female   Other Topics Concern  . None   Social History Narrative   Lives in [place] with wife, [place]. 2 sons, William Hamburger, Lennette Bihari 8.. 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   Skin: Positive for rash.   Objective:  BP 138/90   Pulse 83   Temp 98.4 F (36.9 C) (Oral)   Wt 214 lb 12.8 oz (97.4 kg)   SpO2 98%   BMI 34.67 kg/m   BP/Weight 02/24/2017 01/14/2017 11/15/8117  Systolic BP 147 829 562  Diastolic BP 90 70 81  Wt. (Lbs) 214.8 218 210  BMI 34.67 35.19 33.89   Physical Exam  Constitutional: He is  oriented to person, place, and time. He appears well-developed. No distress.  HENT:  Head: Normocephalic and atraumatic.  Eyes: Conjunctivae are normal. No scleral icterus.  Pulmonary/Chest: Effort normal. No respiratory distress.  Neurological: He is alert and oriented to person, place, and time.  Skin:  Scalp - severe scalp dryness/scaling; no erythema.  Axilla - erythema noted. Irritation noted as well.  Psychiatric: He has a normal mood and affect.  Vitals reviewed.   Lab Results  Component Value Date   WBC 10.4 10/05/2016   HGB 15.5 10/05/2016   HCT 47.3 10/05/2016   PLT 324.0 10/05/2016   GLUCOSE 212 (H) 10/05/2016   CHOL 165 03/25/2016   TRIG 118.0 03/25/2016   HDL 51.60 03/25/2016   LDLDIRECT 78.0 03/21/2013   LDLCALC 90 03/25/2016   ALT 21 09/27/2016   AST 17 09/27/2016   NA 138 10/05/2016   K 3.7 10/05/2016   CL 106 10/05/2016   CREATININE 1.23 10/05/2016   BUN 19 10/05/2016   CO2 22 10/05/2016   TSH 1.00 07/29/2016   PSA 0.64 11/08/2013   INR 0.99 06/03/2014   HGBA1C 7.1 12/28/2016   MICROALBUR 16.4 (H) 02/18/2014    Assessment & Plan:   Problem List Items Addressed This Visit      Musculoskeletal and Integument   Intertrigo  New problem. Treating with diflucan.      Seborrheic dermatitis    New problem. Treating with ketoconazole and clobetasol.         Meds ordered this encounter  Medications  . cyclobenzaprine (FLEXERIL) 5 MG tablet    Sig: Take 5 mg by mouth daily.    Refill:  0  . fluconazole (DIFLUCAN) 150 MG tablet    Sig: Take 1 tablet (150 mg total) by mouth once a week.    Dispense:  4 tablet    Refill:  1  . Clobetasol Propionate 0.05 % shampoo    Sig: Apply to scalp daily for 2 weeks.    Dispense:  118 mL    Refill:  0  . Ketoconazole 2 % FOAM    Sig: Apply twice daily for 4 weeks.    Dispense:  100 g    Refill:  0    Follow-up: PRN  [place]

## 2017-02-24 NOTE — Assessment & Plan Note (Signed)
New problem. Treating with ketoconazole and clobetasol.

## 2017-03-11 ENCOUNTER — Encounter: Payer: Self-pay | Admitting: Family Medicine

## 2017-03-11 ENCOUNTER — Ambulatory Visit (INDEPENDENT_AMBULATORY_CARE_PROVIDER_SITE_OTHER): Payer: Medicare Other | Admitting: Family Medicine

## 2017-03-11 DIAGNOSIS — R21 Rash and other nonspecific skin eruption: Secondary | ICD-10-CM | POA: Insufficient documentation

## 2017-03-11 DIAGNOSIS — L304 Erythema intertrigo: Secondary | ICD-10-CM | POA: Diagnosis not present

## 2017-03-11 DIAGNOSIS — L989 Disorder of the skin and subcutaneous tissue, unspecified: Secondary | ICD-10-CM | POA: Insufficient documentation

## 2017-03-11 HISTORY — DX: Rash and other nonspecific skin eruption: R21

## 2017-03-11 MED ORDER — NYSTATIN 100000 UNIT/GM EX POWD: Freq: Three times a day (TID) | CUTANEOUS | 0 refills | Status: DC

## 2017-03-11 MED ORDER — CEPHALEXIN 500 MG PO CAPS: 500.0000 mg | ORAL_CAPSULE | Freq: Four times a day (QID) | ORAL | 0 refills | Status: DC

## 2017-03-11 NOTE — Patient Instructions (Signed)
Nice to see you. We'll cover you with Keflex for possible skin infection. Please try the topical nystatin. You should continue the Diflucan. If you develop spreading redness, worsening swelling, increased pain, or any new or changing symptoms please seek medical attention.

## 2017-03-11 NOTE — Assessment & Plan Note (Signed)
Improving though still having issues in his groin area. We'll place him on topical nystatin powder. He'll finish the Diflucan as well. He'll monitor these things.

## 2017-03-11 NOTE — Assessment & Plan Note (Signed)
See picture above. Patient concerned for cellulitis given streaks that appeared with no injury particularly in the setting of skin breakdown due to candidal intertrigo. Discussed that they could be scratches. Discussed they could be an atypical presentation skin infection. I felt this was less likely and I discussed this with the patient. Patient significantly concerned for cellulitis. Given tenderness and slight fullness with streaks we decided to cover for skin infection with Keflex. Discussed the risks of being on the antibiotic. Advised on taking a probiotic with this. We'll recheck next week. If worsens or changes he'll be evaluated over the weekend.

## 2017-03-11 NOTE — Progress Notes (Signed)
  [name], MD Phone: [phone]  Justin Patel is a 66 y.o. male who presents today for f/u.  Patient seen 02/24/17 for seborrheic dermatitis and candidal intertrigo. Started on Diflucan. This rash underneath his armpits and on his skin folds on his sides has improved to some degree. The rash in his groin is still bothering him. He notes over the last couple of days he has noted some linear red streaks on the inside of his left upper arm. Notes a slight swelling in that area as well. Notes no fevers. Does note itching. States he feels well.  PMH: nonsmoker.   ROS see history of present illness  Objective  Physical Exam Vitals:   03/11/17 0832  BP: 118/80  Pulse: 100  Temp: 98 F (36.7 C)    BP Readings from Last 3 Encounters:  03/11/17 118/80  02/24/17 138/90  01/14/17 110/70   Wt Readings from Last 3 Encounters:  03/11/17 213 lb 3.2 oz (96.7 kg)  02/24/17 214 lb 12.8 oz (97.4 kg)  01/14/17 218 lb (98.9 kg)    Physical Exam  Constitutional: No distress.  Cardiovascular: Normal rate, regular rhythm and normal heart sounds.   Pulmonary/Chest: Effort normal and breath sounds normal.  Neurological: He is alert. Gait normal.  Skin: He is not diaphoretic.  Axillary rash appears mildly erythematous, skin folds on bilateral sides with some flaking of skin and erythema, inguinal rash with erythema and slight skin irritation, see picture of bicep    There is a slight fullness underlying streaks as well, slight tenderness   Assessment/Plan: Please see individual problem list.  Intertrigo Improving though still having issues in his groin area. We'll place him on topical nystatin powder. He'll finish the Diflucan as well. He'll monitor these things.  Rash See picture above. Patient concerned for cellulitis given streaks that appeared with no injury particularly in the setting of skin breakdown due to candidal intertrigo. Discussed that they could be scratches.  Discussed they could be an atypical presentation skin infection. I felt this was less likely and I discussed this with the patient. Patient significantly concerned for cellulitis. Given tenderness and slight fullness with streaks we decided to cover for skin infection with Keflex. Discussed the risks of being on the antibiotic. Advised on taking a probiotic with this. We'll recheck next week. If worsens or changes he'll be evaluated over the weekend.   No orders of the defined types were placed in this encounter.   Meds ordered this encounter  Medications  . cephALEXin (KEFLEX) 500 MG capsule    Sig: Take 1 capsule (500 mg total) by mouth 4 (four) times daily.    Dispense:  20 capsule    Refill:  0  . nystatin (MYCOSTATIN/NYSTOP) powder    Sig: Apply topically 3 (three) times daily.    Dispense:  15 g    Refill:  0   [name], MD [place]

## 2017-03-13 ENCOUNTER — Other Ambulatory Visit: Payer: Self-pay | Admitting: Family Medicine

## 2017-03-21 ENCOUNTER — Other Ambulatory Visit: Payer: Self-pay | Admitting: Neurosurgery

## 2017-03-21 DIAGNOSIS — R5381 Other malaise: Secondary | ICD-10-CM

## 2017-03-22 ENCOUNTER — Telehealth: Payer: Self-pay | Admitting: Family Medicine

## 2017-03-22 NOTE — Telephone Encounter (Signed)
Left pt message asking to call Allison back directly at [phone] to schedule AWV. Thanks! ° °*NOTE* Never had AWV before °

## 2017-03-29 NOTE — Telephone Encounter (Signed)
Pt confirmed AWV at 3pm

## 2017-03-30 ENCOUNTER — Ambulatory Visit (INDEPENDENT_AMBULATORY_CARE_PROVIDER_SITE_OTHER): Payer: Medicare Other | Admitting: Family Medicine

## 2017-03-30 ENCOUNTER — Ambulatory Visit (INDEPENDENT_AMBULATORY_CARE_PROVIDER_SITE_OTHER): Payer: Medicare Other

## 2017-03-30 ENCOUNTER — Encounter: Payer: Self-pay | Admitting: Family Medicine

## 2017-03-30 VITALS — BP 110/80 | HR 85 | Temp 98.2°F | Resp 15 | Ht 66.0 in | Wt 213.0 lb

## 2017-03-30 VITALS — BP 110/80 | HR 85 | Temp 98.2°F | Wt 213.0 lb

## 2017-03-30 DIAGNOSIS — R21 Rash and other nonspecific skin eruption: Secondary | ICD-10-CM

## 2017-03-30 DIAGNOSIS — I10 Essential (primary) hypertension: Secondary | ICD-10-CM | POA: Diagnosis not present

## 2017-03-30 DIAGNOSIS — M5136 Other intervertebral disc degeneration, lumbar region: Secondary | ICD-10-CM | POA: Diagnosis not present

## 2017-03-30 DIAGNOSIS — L304 Erythema intertrigo: Secondary | ICD-10-CM

## 2017-03-30 DIAGNOSIS — Z1159 Encounter for screening for other viral diseases: Secondary | ICD-10-CM

## 2017-03-30 DIAGNOSIS — L219 Seborrheic dermatitis, unspecified: Secondary | ICD-10-CM

## 2017-03-30 DIAGNOSIS — Z0000 Encounter for general adult medical examination without abnormal findings: Secondary | ICD-10-CM

## 2017-03-30 DIAGNOSIS — E119 Type 2 diabetes mellitus without complications: Secondary | ICD-10-CM

## 2017-03-30 LAB — HEMOGLOBIN A1C: Hgb A1c MFr Bld: 7.5 % — ABNORMAL HIGH (ref 4.6–6.5)

## 2017-03-30 LAB — COMPREHENSIVE METABOLIC PANEL
ALT: 43 U/L (ref 0–53)
AST: 21 U/L (ref 0–37)
Albumin: 4.5 g/dL (ref 3.5–5.2)
Alkaline Phosphatase: 58 U/L (ref 39–117)
BUN: 23 mg/dL (ref 6–23)
CALCIUM: 9.8 mg/dL (ref 8.4–10.5)
CHLORIDE: 104 meq/L (ref 96–112)
CO2: 28 meq/L (ref 19–32)
CREATININE: 1.21 mg/dL (ref 0.40–1.50)
GFR: 63.74 mL/min (ref >60.00)
Glucose, Bld: 156 mg/dL — ABNORMAL HIGH (ref 70–99)
Potassium: 4.4 mEq/L (ref 3.5–5.1)
Sodium: 138 mEq/L (ref 135–145)
Total Bilirubin: 0.3 mg/dL (ref 0.2–1.2)
Total Protein: 7.5 g/dL (ref 6.0–8.3)

## 2017-03-30 LAB — CBC
HCT: 46.2 % (ref 39.0–52.0)
Hemoglobin: 15.1 g/dL (ref 13.0–17.0)
MCHC: 32.7 g/dL (ref 30.0–36.0)
MCV: 101.7 fl — AB (ref 78.0–100.0)
PLATELETS: 232 10*3/uL (ref 150.0–400.0)
RBC: 4.54 Mil/uL (ref 4.22–5.81)
RDW: 13.5 % (ref 11.5–15.5)
WBC: 9.3 10*3/uL (ref 4.0–10.5)

## 2017-03-30 LAB — SEDIMENTATION RATE: Sed Rate: 18 mm/hr (ref 0–20)

## 2017-03-30 MED ORDER — PNEUMOCOCCAL VAC POLYVALENT 25 MCG/0.5ML IJ INJ: 0.5000 mL | INJECTION | Freq: Once | INTRAMUSCULAR | 0 refills | Status: AC

## 2017-03-30 MED ORDER — CYCLOBENZAPRINE HCL 5 MG PO TABS
5.0000 mg | ORAL_TABLET | Freq: Every day | ORAL | 0 refills | Status: DC
Start: 2017-03-30 — End: 2017-06-06

## 2017-03-30 NOTE — Progress Notes (Signed)
[name], MD Phone: [phone]  Justin Patel is a 66 y.o. male who presents today for f/u.  DIABETES Disease Monitoring: Blood Sugar ranges-80-120 Polyuria/phagia/dipsia- no      optho-utd Medications: Compliance- taking glipizide Hypoglycemic symptoms- no  HYPERTENSION  Disease Monitoring  Home BP Monitoring 130s/80s Chest pain- no    Dyspnea- no Medications  Compliance-  taking amlodipine, Lasix, losartan, metoprolol.  Edema- no Patient does report he feels more poorly when his blood pressure is lower than it has typically been running.  Chronic low back pain: Patient has been evaluated by neurosurgery. They did a myelogram recently which showed numerous defects. They're planning to do surgery in several weeks when his neurosurgeon returns from vacation. Patient has been in contact with their office to try to get medication for pain. He notes some intermittent numbness left greater than right legs. No weakness. No bowel or bladder incontinence. No saddle anesthesia. He has been taking intermittent Tylenol and ibuprofen.  Rash: The rash from his head and his candidal intertrigo has resolved.  He notes rash on his lower legs this is been present for some time now. Left greater than right. Nonblanching. Feels like a bruise.    PMH: nonsmoker.   ROS see history of present illness  Objective  Physical Exam Vitals:   03/30/17 1337 03/30/17 1416  BP: 120/90 110/80  Pulse: 85   Temp: 98.2 F (36.8 C)   SpO2: 97%     BP Readings from Last 3 Encounters:  03/30/17 110/80  03/30/17 110/80  03/11/17 118/80   Wt Readings from Last 3 Encounters:  03/30/17 213 lb (96.6 kg)  03/30/17 213 lb (96.6 kg)  03/11/17 213 lb 3.2 oz (96.7 kg)    Physical Exam  Constitutional: No distress.  Cardiovascular: Normal rate, regular rhythm and normal heart sounds.   Pulmonary/Chest: Effort normal and breath sounds normal.  Musculoskeletal: He exhibits edema (slight bilateral  nonpitting edema).  Neurological: He is alert. Gait normal.  Skin: Skin is warm and dry. He is not diaphoretic.  Scattered small pinpoint purpuric rash on bilateral lower legs left greater than right, only occurring over the anterior and medial legs, slight tenderness proximally over the tibia though nothing distally over the rash     Assessment/Plan: Please see individual problem list.  Diabetes (HCC) Check A1c. Continue current medication  Essential hypertension, benign Relatively well controlled. Continue to monitor.  DDD (degenerative disc disease), lumbar Chronic issues with this. Patient questioned whether or not he could get any pain medication and I discussed that he should discuss that with his neurosurgeon. He can try Tylenol as outlined in the AVS. Ibuprofen as well as outlined in the AVS though discussed that he needs to be quite careful with this and if it causes any stomach irritation he should discontinue it. Refill Flexeril.  Rash Patient with purpuric rash on lower extremities. Potentially could be related to venous insufficiency with minimal swelling bilaterally. We'll check lab work as outlined below. If lab work negative consider referral to dermatology.  Seborrheic dermatitis Resolved.  Intertrigo Resolved.   Orders Placed This Encounter  Procedures  . Sed Rate (ESR)  . CBC  . Comp Met (CMET)  . HgB A1c  . Hepatitis C Antibody    Meds ordered this encounter  Medications  . cyclobenzaprine (FLEXERIL) 5 MG tablet    Sig: Take 1 tablet (5 mg total) by mouth daily.    Dispense:  30 tablet    Refill:  0  .  pneumococcal 23 valent vaccine (PNU-IMMUNE) 25 MCG/0.5ML injection    Sig: Inject 0.5 mLs into the muscle once.    Dispense:  0.5 mL    Refill:  0    [name], MD [place]

## 2017-03-30 NOTE — Patient Instructions (Addendum)
  Mr. Justin Patel , Thank you for taking time to come for your Medicare Wellness Visit. I appreciate your ongoing commitment to your health goals. Please review the following plan we discussed and let me know if I can assist you in the future.   Follow up with Dr. Caryl Bis as needed.    Have a great day!  These are the goals we discussed: Goals    . Increase physical activity          Swim and walk for exercise       This is a list of the screening recommended for you and due dates:  Health Maintenance  Topic Date Due  .  Hepatitis C: One time screening is recommended by Center for Disease Control  (CDC) for  adults born from [birth year] through 1965.   25-Nov-1950  . Pneumonia vaccines (2 of 2 - PPSV23) 02/08/2016  . Flu Shot  03/09/2017  . Hemoglobin A1C  06/30/2017  . Complete foot exam   12/28/2017  . Eye exam for diabetics  02/23/2018  . Colon Cancer Screening  03/21/2021  . Tetanus Vaccine  06/03/2024

## 2017-03-30 NOTE — Patient Instructions (Addendum)
Nice to see you. We'll check lab work today contact her with results. Please continue to monitor your prior rashes and if they recur please let us know. You can try taking Tylenol 1000 mg every 8 hours as needed for your back pain. Also try ibuprofen 400 mg every 6 hours as needed. Please take this with food. If it causes stomach irritation or pain please discontinue it and let us know.

## 2017-03-30 NOTE — Progress Notes (Signed)
Subjective:   Justin Patel is a 66 y.o. male who presents for an Initial Medicare Annual Wellness Visit.  Review of Systems  No ROS.  Medicare Wellness Visit. Additional risk factors are reflected in the social history.  Cardiac Risk Factors include: advanced age (>21mn, >>50women);diabetes mellitus;male gender;hypertension;obesity (BMI >30kg/m2)    Objective:    Today's Vitals   03/30/17 1455  BP: 110/80  Pulse: 85  Resp: 15  Temp: 98.2 F (36.8 C)  TempSrc: Oral  SpO2: 97%  Weight: 213 lb (96.6 kg)  Height: 5' 6"  (1.676 m)   Body mass index is 34.38 kg/m.  Current Medications (verified) Outpatient Encounter Prescriptions as of 03/30/2017  Medication Sig  . acetaminophen (TYLENOL) 500 MG tablet Take 1,000 mg by mouth every 6 (six) hours as needed.  .Marland KitchenamLODipine (NORVASC) 5 MG tablet Take 1 tablet (5 mg total) by mouth daily.  . blood glucose meter kit and supplies KIT Dispense based on patient and insurance preference. Check once daily. ICD doing E11.9.  . Clobetasol Propionate 0.05 % shampoo Apply to scalp daily for 2 weeks.  . cyclobenzaprine (FLEXERIL) 5 MG tablet Take 1 tablet (5 mg total) by mouth daily.  . DULoxetine (CYMBALTA) 60 MG capsule take 1 capsule by mouth once daily  . fluconazole (DIFLUCAN) 150 MG tablet Take 1 tablet (150 mg total) by mouth once a week.  . furosemide (LASIX) 20 MG tablet take 1 tablet by mouth once daily  . glipiZIDE (GLUCOTROL) 5 MG tablet Take 1 tablet (5 mg total) by mouth daily before breakfast.  . Ketoconazole 2 % FOAM Apply twice daily for 4 weeks.  .Marland Kitchenlosartan (COZAAR) 100 MG tablet take 1 tablet by mouth once daily  . metoprolol succinate (TOPROL-XL) 50 MG 24 hr tablet Take 1 tablet (50 mg total) by mouth daily. Take with or immediately following a meal.  . NEXIUM 40 MG capsule take 1 capsule by mouth once daily  . nystatin (MYCOSTATIN/NYSTOP) powder Apply topically 3 (three) times daily.  . pneumococcal 23 valent vaccine  (PNU-IMMUNE) 25 MCG/0.5ML injection Inject 0.5 mLs into the muscle once.  . rosuvastatin (CRESTOR) 10 MG tablet Take 1 tablet (10 mg total) by mouth daily.  . sucralfate (CARAFATE) 1 g tablet Take 1 tablet (1 g total) by mouth 4 (four) times daily.  . tamsulosin (FLOMAX) 0.4 MG CAPS capsule take 1 capsule by mouth once daily  . zonisamide (ZONEGRAN) 100 MG capsule Take 1 capsule (100 mg total) by mouth daily.   Facility-Administered Encounter Medications as of 03/30/2017  Medication  . lactated ringers infusion 1,000 mL    Allergies (verified) Altace [ramipril]; Levaquin [levofloxacin in d5w]; Lyrica [pregabalin]; Metformin; and Iohexol   History: Past Medical History:  Diagnosis Date  . 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 Anxiety   . Arthritis   . 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  . CHF ([place]estive heart failure) ([place]   . Chronic kidney disease   . Degenerative disc disease   . Diabetes mellitus    no longer diabetic-or on meds  . Gastric ulcer   . GERD (gastroesophageal reflux disease)   . H/O hiatal hernia   . Headache(784.0)   . Heart murmur   . Hx of congestive heart failure   . Hyperlipidemia   . Hypertension   . Iron deficiency anemia 02/28/2015  . Sacral fracture,  closed ([place])   . Sleep apnea    hx not now since wt loss  . Stones in the urinary tract   . Syncope and collapse    Past Surgical History:  Procedure Laterality Date  . ANTERIOR CERVICAL DECOMP/DISCECTOMY FUSION  01/26/2012   Procedure: ANTERIOR CERVICAL DECOMPRESSION/DISCECTOMY FUSION 3 LEVELS;  Surgeon: Floyce Stakes, MD;  Location: [facility];  Service: Neurosurgery;  Laterality: N/A;  Cervical three-four Cervical four-five Cervical five-six Cervical six-seven , Anterior cervical decompression/diskectomy, fusion, plate  . APPENDECTOMY    . [place], normal  .  CARDIAC CATHETERIZATION     [place]  . GASTRIC BYPASS  2010   [employer]  . HERNIA REPAIR  2000   hiatal  . JOINT REPLACEMENT    . KNEE ARTHROSCOPY     bilateral, left x 2  . NASAL SINUS SURGERY     x5  . OSTEOTOMY  2001   left  . TONSILLECTOMY    . TOTAL KNEE ARTHROPLASTY Left 05/2014   Dr. Marry Guan  . UVULOPALATOPLASTY  2011   Family History  Problem Relation Age of Onset  . Dementia Mother 5  . Heart disease Father 66  . Diabetes Father   . Lymphoma Sister        lymphoma, stage 4  . Ovarian cancer Sister 77       Ovarian  . Lupus Sister    Social History   Occupational History  . Not on file.   Social History Main Topics  . Smoking status: Never Smoker  . Smokeless tobacco: Never Used  . Alcohol use No  . Drug use: No  . Sexual activity: Yes    Partners: Female   Tobacco Counseling Counseling given: Not Answered   Activities of Daily Living In your present state of health, do you have any difficulty performing the following activities: 03/30/2017 01/05/2017  Hearing? N N  Vision? N N  Difficulty concentrating or making decisions? N N  Walking or climbing stairs? Y Y  Comment Back pain -  Dressing or bathing? N N  Doing errands, shopping? N -  Preparing Food and eating ? N -  Using the Toilet? N -  In the past six months, have you accidently leaked urine? N -  Do you have problems with loss of bowel control? N -  Managing your Medications? N -  Managing your Finances? N -  Comment wife shares the responsibility -  Housekeeping or managing your Housekeeping? Y -  Comment Wife assists as needed -  Some recent data might be hidden    Immunizations and Health Maintenance Immunization History  Administered Date(s) Administered  . Influenza, High Dose Seasonal PF 06/25/2016  . Influenza,inj,Quad PF,6+ Mos 07/10/2013  . Influenza-Unspecified 05/21/2012, 05/29/2014, 04/10/2015  . Pneumococcal Conjugate-13 06/13/2014  . Pneumococcal  Polysaccharide-23 03/21/2010  . Tdap 06/03/2014  . Zoster 05/10/2013   Health Maintenance Due  Topic Date Due  . Hepatitis C Screening  06-Nov-1950  . PNA vac Low Risk Adult (2 of 2 - PPSV23) 02/08/2016  . INFLUENZA VACCINE  03/09/2017    Patient Care Team: [name], MD as PCP - General (Family Medicine)  Indicate any recent Medical Services you may have received from other than [facility] providers in the past year (date may be approximate).    Assessment:   This is a routine wellness examination for [place]. The goal of the wellness visit is to assist  the patient how to close the gaps in care and create a preventative care plan for the patient.   The roster of all physicians providing medical care to patient is listed in the Snapshot section of the chart.  Taking calcium VIT D as appropriate/Osteoporosis risk reviewed.    Safety issues reviewed; Smoke and carbon monoxide detectors in the home. No firearms in the home.  Wears seatbelts when driving or riding with others. Patient does wear sunscreen or protective clothing when in direct sunlight. No violence in the home.  Patient is alert, normal appearance, oriented to person/place/and time.  Correctly identified the president of the [place], recall of 3/3 words, and performing simple calculations. Displays appropriate judgement and can read correct time from watch face.   No new identified risk were noted.  No failures at ADL's or IADL's.   BMI- discussed the importance of a healthy diet, water intake and the benefits of aerobic exercise. Educational material provided.   24 hour diet recall: small  portons Breakfast: none Lunch: alfredo pasta Dinner: 2 vegetable pies Snacks: trail mix Daily fluid intake: 4-5 cups of caffeine,  4-5 cups of water  Dental- every 6 months.  Dr. Zigmund Daniel.  Sleep patterns- Sleeps 7-8 hours at night.  Wakes feeling rested. Naps during the day as needed.  Patient Concerns: None at this time.  Follow up with PCP as needed.  Hearing/Vision screen Hearing Screening Comments: Patient is able to hear conversational tones without difficulty.  No issues reported. Hx of ear infection, tubes in place.     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 Increase physical activity          Swim and walk for exercise      Depression Screen PHQ 2/9 Scores 03/30/2017 04/14/2016 02/18/2016 01/19/2016  PHQ - 2 Score 0 0 0 0  Exception Documentation - - - -    Fall Risk Fall Risk  03/30/2017 01/14/2017 10/07/2016 06/07/2016 04/14/2016  Falls in the past year? No No No Yes No  Comment - - - - -  Number falls in past yr: - - - 1 -  Injury with Fall? - - - Yes -  Comment - - - - -  Risk Factor Category  - - - - -  Risk for fall due to : - - - - -  Follow up - - - Falls evaluation completed -  Comment - - - - -    Cognitive Function: MMSE - Mini Mental State Exam 03/30/2017  Orientation to time 5  Orientation to Place 5  Registration 3  Attention/ Calculation 5  Recall 3  Language- name 2 objects 2  Language- repeat 1  Language- follow 3 step command 3  Language- read & follow direction 1  Write a sentence 1  Copy design 1  Total score 30        Screening Tests Health Maintenance  Topic Date Due  . Hepatitis C Screening  10/29/1950  . PNA vac Low Risk Adult (2 of 2 - PPSV23) 02/08/2016  . INFLUENZA VACCINE  03/09/2017  . HEMOGLOBIN A1C  06/30/2017  . FOOT EXAM  12/28/2017  . OPHTHALMOLOGY EXAM  02/23/2018  .  COLONOSCOPY  03/21/2021  . TETANUS/TDAP  06/03/2024        Plan:   End of life planning; Advanced aging; Advanced directives  discussed.  No HCPOA/Living Will.  Additional information declined at this time.  I have personally reviewed and noted the following in the patient's chart:   . Medical and social history . Use of alcohol, tobacco or illicit drugs  . Current medications and supplements . Functional ability and status . Nutritional status . Physical activity . Advanced directives . List of other physicians . Hospitalizations, surgeries, and ER visits in previous 12 months . Vitals . Screenings to include cognitive, depression, and falls . Referrals and appointments  In addition, I have reviewed and discussed with patient certain preventive protocols, quality metrics, and best practice recommendations. A written personalized care plan for preventive services as well as general preventive health recommendations were provided to patient.     Varney Biles, LPN   [id number]/75/[id number]

## 2017-03-31 ENCOUNTER — Other Ambulatory Visit: Payer: Self-pay | Admitting: Cardiovascular Disease

## 2017-03-31 LAB — HEPATITIS C ANTIBODY: HCV Ab: NONREACTIVE

## 2017-03-31 NOTE — Assessment & Plan Note (Addendum)
Chronic issues with this. Patient questioned whether or not he could get any pain medication and I discussed that he should discuss that with his neurosurgeon. He can try Tylenol as outlined in the AVS. Ibuprofen as well as outlined in the AVS though discussed that he needs to be quite careful with this and if it causes any stomach irritation he should discontinue it. Refill Flexeril.

## 2017-03-31 NOTE — Assessment & Plan Note (Signed)
Check A1c.  Continue current medication. °

## 2017-03-31 NOTE — Assessment & Plan Note (Signed)
Resolved

## 2017-03-31 NOTE — Assessment & Plan Note (Signed)
Patient with purpuric rash on lower extremities. Potentially could be related to venous insufficiency with minimal swelling bilaterally. We'll check lab work as outlined below. If lab work negative consider referral to dermatology.

## 2017-03-31 NOTE — Assessment & Plan Note (Signed)
Relatively well controlled. Continue to monitor.

## 2017-04-01 ENCOUNTER — Telehealth: Payer: Self-pay | Admitting: Family Medicine

## 2017-04-01 NOTE — Telephone Encounter (Signed)
Please advise 

## 2017-04-01 NOTE — Telephone Encounter (Signed)
Pt called looking for lab results. Please advise, thank you!  Call pt @ [phone]

## 2017-04-03 NOTE — Telephone Encounter (Signed)
See result note.  

## 2017-04-04 ENCOUNTER — Other Ambulatory Visit: Payer: Self-pay | Admitting: Family Medicine

## 2017-04-04 DIAGNOSIS — D7589 Other specified diseases of blood and blood-forming organs: Secondary | ICD-10-CM

## 2017-04-04 MED ORDER — EMPAGLIFLOZIN 10 MG PO TABS: 10.0000 mg | ORAL_TABLET | Freq: Every day | ORAL | 3 refills | Status: DC

## 2017-04-04 NOTE — Telephone Encounter (Signed)
See result note.  

## 2017-04-05 ENCOUNTER — Other Ambulatory Visit (INDEPENDENT_AMBULATORY_CARE_PROVIDER_SITE_OTHER): Payer: Medicare Other

## 2017-04-05 DIAGNOSIS — D7589 Other specified diseases of blood and blood-forming organs: Secondary | ICD-10-CM

## 2017-04-05 LAB — FOLATE: Folate: 4.1 ng/mL — ABNORMAL LOW

## 2017-04-05 LAB — VITAMIN B12: Vitamin B-12: 250 pg/mL (ref 211–911)

## 2017-04-06 ENCOUNTER — Telehealth: Payer: Self-pay | Admitting: Family Medicine

## 2017-04-06 NOTE — Telephone Encounter (Signed)
Pt called about his lab results and would like to discuss it.   Call pt @ [phone]. Thank you!

## 2017-04-07 NOTE — Telephone Encounter (Signed)
See result note.  

## 2017-04-25 ENCOUNTER — Telehealth: Payer: Self-pay | Admitting: *Deleted

## 2017-04-25 DIAGNOSIS — Z5181 Encounter for therapeutic drug level monitoring: Secondary | ICD-10-CM

## 2017-04-25 DIAGNOSIS — M8580 Other specified disorders of bone density and structure, unspecified site: Secondary | ICD-10-CM | POA: Insufficient documentation

## 2017-04-25 NOTE — Telephone Encounter (Signed)
Pt requested a script for pain medication for 2 weeks while he waits for his appt with the neuro surgeon. Pt has requested a non- narcotic medication. Pt preferred tramadol for back pain.  Pharmacy [facility] [address]  Pt contact   [phone]

## 2017-04-25 NOTE — Telephone Encounter (Signed)
Please advise 

## 2017-04-26 ENCOUNTER — Other Ambulatory Visit: Payer: Self-pay | Admitting: Neurosurgery

## 2017-04-26 DIAGNOSIS — M8580 Other specified disorders of bone density and structure, unspecified site: Secondary | ICD-10-CM

## 2017-04-27 MED ORDER — MELOXICAM 7.5 MG PO TABS: 7.5000 mg | ORAL_TABLET | Freq: Every day | ORAL | 0 refills | Status: DC

## 2017-04-27 NOTE — Telephone Encounter (Signed)
I reviewed his chart and with his possible seizure disorder tramadol is not an option as it increases the risk of seizures by lowering the seizure threshold. We could try a prescription strength antiinflammatory medication if he would like. Thanks.

## 2017-04-27 NOTE — Telephone Encounter (Signed)
Patient notified and would like this sent to [facility] [address]

## 2017-04-27 NOTE — Telephone Encounter (Signed)
Pt called to check on the status of getting some pain meds

## 2017-04-27 NOTE — Telephone Encounter (Signed)
Please advise 

## 2017-04-27 NOTE — Telephone Encounter (Signed)
Spoke with patient. He has a very distant history of gastric ulcer though has not had any issues with those in many years. He's been taking ibuprofen 200 mg twice daily with little benefit though he has had no stomach upset. Patient requested tramadol though I advised him this was not a medication prescribed given that he has seizure history. Discussed we could trial a low-dose prescription anti-inflammatory to see if that would be beneficial though he would need to take this with food and monitor his stomach to see if there is any discomfort. If this occurred he needs to let us know. We'll need to recheck his kidney function sometime next week following starting this medication. He reports he has an appointment with the pharmacist on Monday and we will check it at that time.

## 2017-05-02 ENCOUNTER — Encounter: Payer: Self-pay | Admitting: Pharmacist

## 2017-05-02 ENCOUNTER — Ambulatory Visit (INDEPENDENT_AMBULATORY_CARE_PROVIDER_SITE_OTHER): Payer: Medicare Other | Admitting: Pharmacist

## 2017-05-02 ENCOUNTER — Other Ambulatory Visit: Payer: Self-pay | Admitting: Neurology

## 2017-05-02 DIAGNOSIS — E119 Type 2 diabetes mellitus without complications: Secondary | ICD-10-CM

## 2017-05-02 MED ORDER — EMPAGLIFLOZIN 25 MG PO TABS: 25.0000 mg | ORAL_TABLET | Freq: Every day | ORAL | 3 refills | Status: DC

## 2017-05-02 NOTE — Progress Notes (Addendum)
S:     Chief Complaint  Patient presents with  . Medication Management    Diabetes    Patient arrives in good spirits, ambulating without assistance.  Presents for diabetes evaluation, education, and management at the request of Dr. Caryl Bis. Patient was referred on 04/04/2017.  Patient was last seen by Primary Care Provider on 03/30/2017. After most recent A1C result, Jardiance 10 mg daily was started.   Pt reports that his diabetes has been under control since he had bariatric surgery in 2010. However, recently his A1C started to creep over 7% which was why he was started on Jardiance and continued on glipizide. He states he is tolerating the Jardiance well. Denies dizziness, lightheadedness, falls, or UTIs.Denies s/sx UTI or Fournier's gangrene, though does endorse "fungal rash" in skin folds under arms, on trunk, and near groin. Treating this under direction of Dr. Caryl Bis.   Pt says that his BG in the mornings are in the 100 to 120 range and 140s after a meal and lowest BG was 92 since starting Jardiance. Previously, fasting readings were in the 150s to 160s.   Family/Social History:  -Mother has a hx of dementia, heart disease, and diabetes -Father has a hx of heart disease, diabetes. -Sister 1 has a hx of lymphoma -Sister 2 has a hx of ovarian cancer -Sister 3 has a hx of lupus  -Has 1 glass of wine a week. Denies tobacco use.  Patient reports adherence with medications.  Current diabetes medications include: glipizide 5 mg daily, jardiance 10 mg daily Current hypertension medications include: amlodipine 5 mg daily, furosemide 20 mg daily, losartan 100 mg daily, metoprolol succinate 50 mg daily, tamsulosin 0.4 mg daily  Patient denies hypoglycemic events.  Patient reported dietary habits: Eats 2 meals/day, one mid morning and one mid afternoon. Usually snacks at night.  Breakfast: Eggs, toast with cheese Lunch: Salad, sweet potato, half rack of ribs  Dinner: [place] and fruit, likes smoked salmon Snacks: Cheez-Its, chocolate Drinks: Starbucks Double Shot, at least twice a day. Tries to drink at least three bottles of water daily.   Patient reported exercise habits: Used to swim in the past but exercise has been limited by back pain   Patient reports nocturia x1 a night   Patient denies neuropathy. Patient denies visual changes. Patient reports self foot exams. No issues   O:  Physical Exam  Constitutional: He appears well-developed and well-nourished.     Review of Systems  All other systems reviewed and are negative.    Lab Results  Component Value Date   HGBA1C 7.5 (H) 03/30/2017   Vitals:   05/02/17 1311  BP: 106/72  Pulse: 97    Home fasting CBG: 100 to 120, lowest was around 92 2 hour post-prandial/random CBG: 140s  10 year ASCVD risk: 19.6%.  Last lipid panel 03/25/17 LDL: 90 HDL: 51.6 TG: 118 TC: 165  A/P: Diabetes longstanding currently uncontrolled per A1C. Patient denies hypoglycemic events and is able to verbalize appropriate hypoglycemia management plan. Patient reports adherence with medication. Control is suboptimal due to dietary indiscretion and un-optimized medications. Following discussion and approval by Dr. Caryl Bis, the following medication changes were made.  -Stop glipizide 5 mg in order to preserve beta cell function. -Increase Jardiance dose to 25 mg daily after watching BG for a few days.  -Counseled on sick day rules for Jardiance, patient verbalized and demonstrated understanding with teach back method. -Next A1C anticipated 06/30/2017.    ASCVD risk  greater than 7.5%. Continue rosuvastatin 10 mg. Will not start aspirin 81 mg given that pt has a history of GI bleeding, is very wary of said hx, and he is on NSAID for back pain.     Patient was seen with Dr. Caryl Bis today in clinic and medication changes were discussed and approved prior to initiation Written patient instructions  provided.  Total time in face to face counseling 45 minutes.   Follow up in Pharmacist Clinic Visit in 1 month.    Patient seen with Barkley Boards, PharmD Candidate.   Carlean Jews, Pharm.D. PGY2 Ambulatory Care Pharmacy Resident Phone: [phone]

## 2017-05-02 NOTE — Assessment & Plan Note (Addendum)
Diabetes longstanding currently uncontrolled per A1C. Patient denies hypoglycemic events and is able to verbalize appropriate hypoglycemia management plan. Patient reports adherence with medication. Control is suboptimal due to dietary indiscretion and un-optimized medications. Following discussion and approval by Dr. Caryl Bis, the following medication changes were made.  -Stop glipizide 5 mg in order to preserve beta cell function. -Increase Jardiance dose to 25 mg daily after watching BG for a few days.  -Counseled on sick day rules for Jardiance, patient verbalized and demonstrated understanding with teach back method. -Next A1C anticipated 06/30/2017.     ASCVD risk greater than 7.5%. Continue rosuvastatin 10 mg. Will not start aspirin 81 mg given that pt has a history of GI bleeding, is very wary of said hx, and he is on NSAID for back pain.

## 2017-05-02 NOTE — Patient Instructions (Addendum)
It was good seeing you today  Your blood glucose readings at home are controlled  Continue taking Jardiance 10 mg once daily. If you notice blood sugar readings greater than 130 in the mornings, increase Jardiance to 25 mg once daily  Stop taking glipizide 5 mg for the reasons we've discussed   Stop taking amlodipine 5 mg. Your blood pressure is good, and Jardiance is helping to lower it.   Follow up in pharmacy clinic in 1 month.

## 2017-05-02 NOTE — Assessment & Plan Note (Signed)
Hypertension longstanding currently controlled.  Patient reports adherence with medication. -Stop amlodipine 5 mg. Expect further decrease in BP with increase in Jardiance. Pt also complains of slight LEE. -F/u tolerance at next visit.

## 2017-05-03 MED ORDER — NYSTATIN 100000 UNIT/GM EX CREA: 1.0000 "application " | TOPICAL_CREAM | Freq: Two times a day (BID) | CUTANEOUS | 0 refills | Status: DC

## 2017-05-03 MED ORDER — HYDROCODONE-ACETAMINOPHEN 5-325 MG PO TABS: 1.0000 | ORAL_TABLET | Freq: Four times a day (QID) | ORAL | 0 refills | Status: DC | PRN

## 2017-05-03 NOTE — Progress Notes (Signed)
  [name], MD Phone: [phone]  Justin Patel is a 66 y.o. male who presents today for follow-up.  Patient seen in conjunction with the pharmacist. I agree with her note.  Patient reports continued rash on his left lower extremity. There are no other symptoms with this. It has not improved at all. He has not seen dermatology.  Continues to have issues with candidiasis in his skin folds particularly on his sides. Notes the areas will break out in a rash and then become raw. He's tried putting topical steroids on it with little benefit. He is also put powder on it.  Patient continues to have low back pain. He is in the process of getting evaluated for surgical intervention by neurosurgery though he reports his neurosurgeon has been in and out of the office over the last month or so. He's been unable to get any pain medication from them. He notes the oral anti-inflammatory that we provided was not beneficial at all. He is unable to take tramadol due to history of seizures.  PMH: nonsmoker.   ROS see history of present illness  Objective  Physical Exam Vitals:   05/02/17 1311  BP: 106/72  Pulse: 97    BP Readings from Last 3 Encounters:  05/02/17 106/72  03/30/17 110/80  03/30/17 110/80   Wt Readings from Last 3 Encounters:  05/02/17 209 lb (94.8 kg)  03/30/17 213 lb (96.6 kg)  03/30/17 213 lb (96.6 kg)    Physical Exam  Constitutional: No distress.  Pulmonary/Chest: Effort normal.  Skin: He is not diaphoretic.  Left lower extremity with pinpoint erythematous rash  Skin and folds on sides with slight erythema and powder on top of this area   Assessment/Plan: Please see individual problem list.  Rash on legs: Patient has a dermatologist whom he states he will make an appointment with.  Candidiasis: Suspect candidal intertrigo. We'll treat with nystatin cream.  Chronic low back pain: Discussed consideration of treatment with narcotic given that he can't  take tramadol and that oral anti-inflammatories have not been beneficial. He will need to follow-up with his surgeon as well. Advised to discontinue the oral anti-inflammatory. Prescription will be given for Norco 10 tablets. This is a short-term prescription and will not be prescribed chronically for this patient through this office.  [name], MD [place]

## 2017-05-03 NOTE — Addendum Note (Signed)
Addended by: Caryl Bis, Aldea Avis G on: 05/03/2017 10:15 AM   Modules accepted: Orders, Level of Service

## 2017-05-03 NOTE — Progress Notes (Signed)
Patient notified

## 2017-05-16 ENCOUNTER — Ambulatory Visit
Admission: RE | Admit: 2017-05-16 | Discharge: 2017-05-16 | Disposition: A | Discharge status: Home / Self Care | Payer: Medicare Other | Source: Ambulatory Visit | Attending: Neurosurgery | Admitting: Neurosurgery

## 2017-05-16 DIAGNOSIS — M8580 Other specified disorders of bone density and structure, unspecified site: Secondary | ICD-10-CM

## 2017-05-17 ENCOUNTER — Telehealth: Payer: Self-pay | Admitting: Neurology

## 2017-05-17 NOTE — Telephone Encounter (Signed)
Patient called in needing a refill on his medication. He was told he had no refills left. Please Advise. Thanks

## 2017-05-18 NOTE — Telephone Encounter (Signed)
Spoke with Pt, advsd him he needs to make an appt for December, and to have pharmacy contact us with medication he needs refilled

## 2017-05-22 ENCOUNTER — Other Ambulatory Visit: Payer: Self-pay | Admitting: Family Medicine

## 2017-05-22 DIAGNOSIS — R600 Localized edema: Secondary | ICD-10-CM

## 2017-05-23 ENCOUNTER — Telehealth: Payer: Self-pay | Admitting: Family Medicine

## 2017-05-23 DIAGNOSIS — M810 Age-related osteoporosis without current pathological fracture: Secondary | ICD-10-CM

## 2017-05-23 NOTE — Telephone Encounter (Signed)
Pt called and stated that he went to see the back surgeon today. They went over his bone density results and they found that he has osteoporosis. The surgeon would like Dr. Caryl Bis to set up treatment for the osteoporosis and to come back to see the surgeon in mid January. Please advise, thank you!  Call pt @ [phone]

## 2017-05-23 NOTE — Telephone Encounter (Signed)
I believe the patient has a history of gastric ulcer though please confirm this with him. Does he have any issues with esophageal ulcers or reflux? If so this would dictate what we do for his osteoporosis. Please find out if he takes calcium and vitamin D. Once I know these things I can give further recommendations. Thanks.

## 2017-05-23 NOTE — Telephone Encounter (Signed)
Please advise 

## 2017-05-24 NOTE — Telephone Encounter (Signed)
Patient states he has had a gastric ulcer in the past, he does not have any problems with esophageal ulcer or reflux. Patient is not taking vit d or calcium. Patient states neurosurgery would like for him to start something soon so that they can do surgery.

## 2017-05-24 NOTE — Telephone Encounter (Signed)
We could consider IV zoledronic acid given once a year which would require a referral to rheumatology at [facility] or prolia which is an injection given in our office every 6 months. Given his history of gastric ulcer he would not be a fosamax candidate. He will need to have calcium and vitamin d testing prior to starting any medications. Please get him set up for labs. Orders have been placed. He will also need to be on calcium 1200 mg daily and vitamin D8 100 international units daily supplements

## 2017-05-25 NOTE — Telephone Encounter (Signed)
Noted. I will need to see the lab results first. I will forward to Lavella Lemons so she is aware and could potentially start the process after labs come back.

## 2017-05-25 NOTE — Telephone Encounter (Signed)
Noted, I will hold till you request, thanks

## 2017-05-25 NOTE — Telephone Encounter (Signed)
Patient notified and states he would like to go ahead and get set up for prolia. Patient is scheduled for labs on Friday and will start vit d8 and calcium

## 2017-05-27 ENCOUNTER — Other Ambulatory Visit: Payer: Self-pay | Admitting: Family Medicine

## 2017-05-27 ENCOUNTER — Other Ambulatory Visit (INDEPENDENT_AMBULATORY_CARE_PROVIDER_SITE_OTHER): Payer: Medicare Other

## 2017-05-27 DIAGNOSIS — E559 Vitamin D deficiency, unspecified: Secondary | ICD-10-CM

## 2017-05-27 DIAGNOSIS — M810 Age-related osteoporosis without current pathological fracture: Secondary | ICD-10-CM | POA: Diagnosis not present

## 2017-05-27 LAB — COMPREHENSIVE METABOLIC PANEL
ALT: 31 U/L (ref 0–53)
AST: 18 U/L (ref 0–37)
Albumin: 4.3 g/dL (ref 3.5–5.2)
Alkaline Phosphatase: 60 U/L (ref 39–117)
BUN: 39 mg/dL — AB (ref 6–23)
CO2: 28 mEq/L (ref 19–32)
CREATININE: 1.3 mg/dL (ref 0.40–1.50)
Calcium: 9.3 mg/dL (ref 8.4–10.5)
Chloride: 100 mEq/L (ref 96–112)
GFR: 58.65 mL/min — ABNORMAL LOW (ref >60.00)
Glucose, Bld: 200 mg/dL — ABNORMAL HIGH (ref 70–99)
Potassium: 4.3 mEq/L (ref 3.5–5.1)
SODIUM: 136 meq/L (ref 135–145)
TOTAL PROTEIN: 7.2 g/dL (ref 6.0–8.3)
Total Bilirubin: 0.3 mg/dL (ref 0.2–1.2)

## 2017-05-27 LAB — VITAMIN D 25 HYDROXY (VIT D DEFICIENCY, FRACTURES): VITD: 16.2 ng/mL — AB (ref 30.00–100.00)

## 2017-05-27 MED ORDER — VITAMIN D (ERGOCALCIFEROL) 1.25 MG (50000 UNIT) PO CAPS: 50000.0000 [IU] | ORAL_CAPSULE | ORAL | 0 refills | Status: DC

## 2017-05-30 NOTE — Telephone Encounter (Signed)
Prolia paperwork started and given to PCP to sign, thanks

## 2017-06-06 ENCOUNTER — Ambulatory Visit (INDEPENDENT_AMBULATORY_CARE_PROVIDER_SITE_OTHER): Payer: Medicare Other | Admitting: Pharmacist

## 2017-06-06 ENCOUNTER — Encounter: Payer: Self-pay | Admitting: Pharmacist

## 2017-06-06 DIAGNOSIS — I10 Essential (primary) hypertension: Secondary | ICD-10-CM

## 2017-06-06 DIAGNOSIS — E119 Type 2 diabetes mellitus without complications: Secondary | ICD-10-CM

## 2017-06-06 MED ORDER — ROSUVASTATIN CALCIUM 20 MG PO TABS: 20.0000 mg | ORAL_TABLET | Freq: Every day | ORAL | 6 refills | Status: DC

## 2017-06-06 MED ORDER — METOPROLOL SUCCINATE ER 50 MG PO TB24: 50.0000 mg | ORAL_TABLET | Freq: Every day | ORAL | 6 refills | Status: DC

## 2017-06-06 NOTE — Progress Notes (Signed)
I have reviewed the above note and agree. I saw the patient with the pharmacist.  Jacque Garrels, M.D.  

## 2017-06-06 NOTE — Progress Notes (Signed)
  S:     No chief complaint on file.  Patient arrives in good spirits, ambulating without assistance.  Presents for diabetes evaluation, education, and management at the request of Dr. Sonnenberg. Patient was referred on 04/04/2017.  Patient was last seen by Primary Care Provider on 05/02/2017.  Last Rx Clinic visit on 05/02/2017 - at that time Jardiance was increased, glipizide was d/c'd.   Patient reports that he thinks he increased Jardiance, however when we called the pharmacy together to double check, patient had just filled for the first time ~ 5 days ago. Patient states that he dumped in the old supply of Jardiance into the new bottle and therefore doesn't think he has taken many if any of the Jardiance 25 mg tablets. Did discontinue Glipizide. Patient reports that his fastings have been running higher than desired - 130-150s fasting, still eating two meals/day. Before bedtime CBGs are 180-low 200s.   He reports that he is following up with Neurosurgery and their pain clinic for management of chronic back issues.   Needs refill on metoprolol.  Has been on other statins before and denies any intolerances - cannot remember which ones or doses. Rash has gotten better, still itchy at times, moisturizing. Denies personal or family h/o MEN2 or thyroid cancers.   Family/Social History: well traveled, served in the [employer], sings and plays piano and harp. -Mother has a hx of dementia, heart disease, and diabetes -Father has a hx of heart disease, diabetes. -Sister 1 has a hx of lymphoma -Sister 2 has a hx of ovarian cancer -Sister 3 has a hx of lupus  -Has 1 glass of wine a week. Denies tobacco use.  Insurance coverage/medication affordability: [employer] Medicare - meds affordable   Patient reports adherence with medications.  Current diabetes medications include: jardiance 25 mg daily Current hypertension medications include: amlodipine 5 mg daily, furosemide 20 mg daily, losartan 100 mg daily,  metoprolol succinate 50 mg daily, tamsulosin 0.4 mg daily  Patient denies hypoglycemic events.  Patient reported dietary habits: Eats 2 meals/day, one mid morning and one mid afternoon. Usually snacks at night.  Breakfast: Eggs, toast with cheese Lunch: Salad, sweet potato, half rack of ribs  Dinner: Cottage cheese and fruit, likes smoked salmon Snacks: Cheez-Its, chocolate Drinks: Starbucks Double Shot, at least twice a day. Tries to drink at least three bottles of water daily.   Patient reported exercise habits: Used to swim in the past but exercise has been limited by back pain   Patient reports nocturia x1 a night   Patient denies neuropathy. Patient denies visual changes. Patient reports self foot exams. No issues    O:  Physical Exam  Constitutional: He appears well-developed and well-nourished.  Vitals reviewed.    Review of Systems  Genitourinary: Negative for dysuria, frequency and urgency.  All other systems reviewed and are negative.   Lab Results  Component Value Date   HGBA1C 7.5 (H) 03/30/2017   Vitals:   06/06/17 1009  BP: 137/88  Pulse: 76    Home fasting CBG: 130-150s 2 hour post-prandial/random CBG: 180s-low 200s.  10 year ASCVD risk: 19.6%.  Last lipid panel 03/25/17 LDL: 90 HDL: 51.6 TG: 118 TC: 165   Hepatic Function Latest Ref Rng & Units 05/27/2017 03/30/2017 09/27/2016  Total Protein 6.0 - 8.3 g/dL 7.2 7.5 7.2  Albumin 3.5 - 5.2 g/dL 4.3 4.5 4.3  AST 0 - 37 U/L 18 21 17  ALT 0 - 53 U/L 31 43 21    Alk Phosphatase 39 - 117 U/L 60 58 69  Total Bilirubin 0.2 - 1.2 mg/dL 0.3 0.3 0.3   A/P: #Diabetes longstanding currently uncontrolled as evidenced by A1C. Patient denies hypoglycemic events and is able to verbalize appropriate hypoglycemia management plan. Patient denies adherence with medication. Control is suboptimal due to confusion over Jardiance dosing. Following discussion and approval by Dr. Acey Lav, the following medication  changes were made:  - Reinforced adherence to Jardiance 25 mg tablets. Counseled on sick day rules, patient endorses understanding.  - Asked patient to check CBGs 2-3 times daily and bring in log - Next A1C anticipated 06/30/2017 or later.    #ASCVD risk - patient with clinical ASCVD/greater than 7.5%. - Increased dose of rosuvastatin to 20 mg daily.  - Will not start ASA with h/o GIB and patient's hesitation to start a chronic NSAID  #Hypertension longstanding currently above goal of <130/80.  Patient reports adherence with medication however did not increase Jardiance. - Reinforced adherence to Jardiance 25 mg daily - anticipate that this will help lower BP. - Sent refill for metoprolol, denies need of others - Will follow up at next visit.   Patient was seen with Dr. Caryl Bis today in clinic and medication changes were discussed and approved prior to initiation.Written patient instructions provided.  Total time in face to face counseling 50  minutes.    Follow up in Pharmacist Clinic Visit 4 weeks.     Carlean Jews, Pharm.D. PGY2 Ambulatory Care Pharmacy Resident Phone: [phone]

## 2017-06-06 NOTE — Patient Instructions (Signed)
Thank you for coming to see me today.   1. Increase your Jardiance to the 25 mg strength. This is the oval shaped tablet that has an S-25 on one side. If you still have some of the lower dose you can take two tablet of the 10 mg strength (marked S10 on one side) at the same time until those are gone.   2. I sent refills for the metoprolol   3. We are increasing your crestor to 20 mg daily.   Come back to see me in about ~4 weeks. Bring your blood sugar log with you.   My phone number is [phone].

## 2017-06-08 ENCOUNTER — Other Ambulatory Visit: Payer: Self-pay | Admitting: Neurology

## 2017-06-10 ENCOUNTER — Telehealth: Payer: Self-pay | Admitting: Family Medicine

## 2017-06-10 ENCOUNTER — Telehealth: Payer: Self-pay

## 2017-06-10 NOTE — Telephone Encounter (Signed)
Attempted to call patient. Will try again Monday.

## 2017-06-10 NOTE — Telephone Encounter (Signed)
Copied from [place] [id number]. Topic: Quick Communication - See Telephone Encounter >> Jun 10, 2017  5:08 PM Boyd Kerbs wrote: CRM for notification. See Telephone encounter for:  06/10/17. Returning call regarding injection - someone called him,  Leaving for a week on sunday

## 2017-06-10 NOTE — Telephone Encounter (Signed)
Left detailed v/m for patient regarding injection date. Advised to call back if any questions.  Copied from [place] [phone]. Topic: Quick Communication - See Telephone Encounter >> Jun 10, 2017  3:28 PM Ebony Hail wrote: CRM for notification. See Telephone encounter for:  06/10/17.  Pt is calling to check if going out of town 11/3-11/10; next date available for Prolia Inj. 11/12 Mon.; pt wants to make sure that not being available for this week will not impact treatment plan... If so please contact this evening if he needs to reschedule his trip

## 2017-06-13 NOTE — Telephone Encounter (Signed)
Spoke with patient and advised him that we would call back and schedule prolia once we have received paperwork from insurance

## 2017-06-23 NOTE — Telephone Encounter (Signed)
MyChart message sent to patient.  Copied from [place] [phone]. Topic: Quick Communication - See Telephone Encounter >> Jun 10, 2017  5:08 PM Boyd Kerbs wrote: CRM for notification. See Telephone encounter for:  06/10/17. >> Jun 23, 2017 12:09 PM Neva Seat wrote: Pt is calling back 2nd time regarding injections is it approval -  Needs a call back asap...thanks >> Jun 23, 2017 12:15 PM Neva Seat wrote: Can leave message in [place] as well - doesn't have to be verbal. -per pt.

## 2017-07-04 ENCOUNTER — Telehealth: Payer: Self-pay | Admitting: *Deleted

## 2017-07-04 NOTE — Telephone Encounter (Signed)
Copied from [place]. Topic: General - Other >> Jul 04, 2017 10:27 AM Conception Chancy, NT wrote: Reason for CRM: pt is calling because he said he is supposed to be getting a prolia injection, and he would like to know if it is in and when he is supposed to get that, please contact pt with information.

## 2017-07-05 NOTE — Telephone Encounter (Signed)
Left message for patient to return callto office needs nurse visit scheduled for Prolia injection.

## 2017-07-13 ENCOUNTER — Other Ambulatory Visit: Payer: Self-pay | Admitting: Family Medicine

## 2017-07-20 ENCOUNTER — Other Ambulatory Visit: Payer: Self-pay

## 2017-07-20 ENCOUNTER — Encounter: Payer: Self-pay | Admitting: Family Medicine

## 2017-07-20 ENCOUNTER — Ambulatory Visit: Payer: Medicare Other | Admitting: Family Medicine

## 2017-07-20 VITALS — BP 130/90 | HR 104 | Temp 98.4°F | Wt 205.8 lb

## 2017-07-20 DIAGNOSIS — E559 Vitamin D deficiency, unspecified: Secondary | ICD-10-CM

## 2017-07-20 DIAGNOSIS — M5136 Other intervertebral disc degeneration, lumbar region: Secondary | ICD-10-CM | POA: Diagnosis not present

## 2017-07-20 DIAGNOSIS — E119 Type 2 diabetes mellitus without complications: Secondary | ICD-10-CM | POA: Diagnosis not present

## 2017-07-20 DIAGNOSIS — J069 Acute upper respiratory infection, unspecified: Secondary | ICD-10-CM

## 2017-07-20 DIAGNOSIS — M810 Age-related osteoporosis without current pathological fracture: Secondary | ICD-10-CM | POA: Diagnosis not present

## 2017-07-20 DIAGNOSIS — M51369 Other intervertebral disc degeneration, lumbar region without mention of lumbar back pain or lower extremity pain: Secondary | ICD-10-CM

## 2017-07-20 HISTORY — DX: Vitamin D deficiency, unspecified: E55.9

## 2017-07-20 LAB — BASIC METABOLIC PANEL
BUN: 27 mg/dL — ABNORMAL HIGH (ref 6–23)
CALCIUM: 10.1 mg/dL (ref 8.4–10.5)
CO2: 29 mEq/L (ref 19–32)
CREATININE: 1.08 mg/dL (ref 0.40–1.50)
Chloride: 99 mEq/L (ref 96–112)
GFR: 72.61 mL/min (ref >60.00)
Glucose, Bld: 213 mg/dL — ABNORMAL HIGH (ref 70–99)
Potassium: 4.7 mEq/L (ref 3.5–5.1)
SODIUM: 138 meq/L (ref 135–145)

## 2017-07-20 LAB — VITAMIN D 25 HYDROXY (VIT D DEFICIENCY, FRACTURES): VITD: 38.4 ng/mL (ref 30.00–100.00)

## 2017-07-20 LAB — HEMOGLOBIN A1C: HEMOGLOBIN A1C: 8 % — AB (ref 4.6–6.5)

## 2017-07-20 MED ORDER — DENOSUMAB 60 MG/ML ~~LOC~~ SOLN
60.0000 mg | Freq: Once | SUBCUTANEOUS | Status: AC
Start: 2017-07-20 — End: 2017-07-20
  Administered 2017-07-20: 60 mg via SUBCUTANEOUS

## 2017-07-20 NOTE — Assessment & Plan Note (Signed)
Poorly controlled.  Will check an A1c and likely start Trulicity.  He notes no family history of thyroid cancer or MEN2 cancers.  No personal history of thyroid cancer.

## 2017-07-20 NOTE — Progress Notes (Signed)
[name], MD Phone: [phone]  Justin Patel is a 66 y.o. male who presents today for follow-up.  Diabetes: Typically 150-230.  Taking Jardiance 25 mg.  No polyuria or polydipsia.  No hypoglycemia.  He is craving sweets.  He notes congestion for several weeks.  It has gotten thinner and it has become clear.  It was green.  Has been using saline.  No fevers.  Does note some external ear soreness.  Osteoporosis: He is due to start Prolia.  He just completed 8 weeks of vitamin D supplementation.  Due for recheck.  Calcium has been normal.  He is following with pain management and neurosurgery for his back.  He is currently on lorcet.  This has been helpful to take the edge off.  He follows up with neurosurgery to make a decision on surgery in January.  He does note dry itchy skin with apparent keratosis Pillaris on his posterior triceps.  Social History   Tobacco Use  Smoking Status Never Smoker  Smokeless Tobacco Never Used     ROS see history of present illness  Objective  Physical Exam Vitals:   07/20/17 1332  BP: 130/90  Pulse: (!) 104  Temp: 98.4 F (36.9 C)  SpO2: 95%    BP Readings from Last 3 Encounters:  07/20/17 130/90  06/06/17 137/88  05/02/17 106/72   Wt Readings from Last 3 Encounters:  07/20/17 205 lb 12.8 oz (93.4 kg)  06/06/17 210 lb 12.8 oz (95.6 kg)  05/02/17 209 lb (94.8 kg)    Physical Exam  Constitutional: No distress.  HENT:  Head: Normocephalic and atraumatic.  Mouth/Throat: Oropharynx is clear and moist. No oropharyngeal exudate.  Left TM with tympanostomy tube, otherwise normal, right TM normal, bilateral ear canals normal  Eyes: Conjunctivae are normal. Pupils are equal, round, and reactive to light.  Cardiovascular: Normal rate, regular rhythm and normal heart sounds.  Pulmonary/Chest: Effort normal and breath sounds normal.  Musculoskeletal: He exhibits no edema.  Neurological: He is alert. Gait normal.  Skin: Skin is  warm and dry. He is not diaphoretic.  Keratosis Pillaris noted on bilateral upper arms, skin is dry   Assessment/Plan: Please see individual problem list.  Diabetes ([place]) Poorly controlled.  Will check an A1c and likely start Trulicity.  He notes no family history of thyroid cancer or MEN2 cancers.  No personal history of thyroid cancer.  DDD (degenerative disc disease), lumbar We will continue to follow with neurosurgery and their pain management specialist.  Osteoporosis without current pathological fracture To start on Prolia today.  Patient has read the package insert and is aware of major risk factors with this medication.  Calcium recently normal.  Encouraged patient to start on calcium supplementation.  Check vitamin D today.  Vitamin D deficiency Recheck today.  This will determine supplementation moving forward.  URI (upper respiratory infection) Improving.  No signs of bacterial infection.  Patient will monitor and if worsens he will let us know.  Could consider calling in an antibiotic if he worsens.  Patient with keratosis Pillaris and dry skin on his arms.  Discussed trying Aveeno lotion for this.  Zachory was seen today for follow-up.  Diagnoses and all orders for this visit:  Type 2 diabetes mellitus without complication, without long-term current use of insulin (HCC) -     Basic Metabolic Panel (BMET) -     HgB A1c  Vitamin D deficiency -     Vitamin D (25 hydroxy)  Osteoporosis without  current pathological fracture, unspecified osteoporosis type -     denosumab (PROLIA) injection 60 mg  DDD (degenerative disc disease), lumbar  Viral upper respiratory tract infection    Orders Placed This Encounter  Procedures  . Basic Metabolic Panel (BMET)  . HgB A1c  . Vitamin D (25 hydroxy)    Meds ordered this encounter  Medications  . denosumab (PROLIA) injection 60 mg     [name], MD [place]

## 2017-07-20 NOTE — Assessment & Plan Note (Signed)
We will continue to follow with neurosurgery and their pain management specialist.

## 2017-07-20 NOTE — Patient Instructions (Signed)
Nice to see you. We will check lab work today and contact you with the results. Please monitor your congestion and if it does not continue to improve please let us know. You can try Aveeno for your dry skin.  If not improving we can have you see dermatology.

## 2017-07-20 NOTE — Assessment & Plan Note (Signed)
Recheck today.  This will determine supplementation moving forward.

## 2017-07-20 NOTE — Assessment & Plan Note (Signed)
Improving.  No signs of bacterial infection.  Patient will monitor and if worsens he will let us know.  Could consider calling in an antibiotic if he worsens.

## 2017-07-20 NOTE — Assessment & Plan Note (Signed)
To start on Prolia today.  Patient has read the package insert and is aware of major risk factors with this medication.  Calcium recently normal.  Encouraged patient to start on calcium supplementation.  Check vitamin D today.

## 2017-07-22 ENCOUNTER — Other Ambulatory Visit: Payer: Self-pay | Admitting: Family Medicine

## 2017-07-22 MED ORDER — DULAGLUTIDE 0.75 MG/0.5ML ~~LOC~~ SOAJ: 0.7500 mg | SUBCUTANEOUS | 2 refills | Status: DC

## 2017-07-25 ENCOUNTER — Ambulatory Visit: Payer: Medicare Other | Admitting: Family Medicine

## 2017-08-29 ENCOUNTER — Other Ambulatory Visit: Payer: Self-pay | Admitting: Family Medicine

## 2017-08-29 DIAGNOSIS — E119 Type 2 diabetes mellitus without complications: Secondary | ICD-10-CM

## 2017-09-20 ENCOUNTER — Other Ambulatory Visit: Payer: Self-pay | Admitting: Family Medicine

## 2017-09-20 DIAGNOSIS — R600 Localized edema: Secondary | ICD-10-CM

## 2017-09-21 ENCOUNTER — Other Ambulatory Visit: Payer: Self-pay | Admitting: Family Medicine

## 2017-09-21 DIAGNOSIS — R600 Localized edema: Secondary | ICD-10-CM

## 2017-10-18 ENCOUNTER — Encounter: Payer: Self-pay | Admitting: Family Medicine

## 2017-10-18 ENCOUNTER — Other Ambulatory Visit: Payer: Self-pay

## 2017-10-18 ENCOUNTER — Ambulatory Visit: Payer: Medicare Other | Admitting: Family Medicine

## 2017-10-18 VITALS — BP 110/76 | HR 89 | Temp 98.5°F | Wt 194.6 lb

## 2017-10-18 DIAGNOSIS — R21 Rash and other nonspecific skin eruption: Secondary | ICD-10-CM | POA: Diagnosis not present

## 2017-10-18 DIAGNOSIS — K5900 Constipation, unspecified: Secondary | ICD-10-CM | POA: Diagnosis not present

## 2017-10-18 DIAGNOSIS — E119 Type 2 diabetes mellitus without complications: Secondary | ICD-10-CM

## 2017-10-18 DIAGNOSIS — R634 Abnormal weight loss: Secondary | ICD-10-CM | POA: Diagnosis not present

## 2017-10-18 DIAGNOSIS — N644 Mastodynia: Secondary | ICD-10-CM | POA: Insufficient documentation

## 2017-10-18 DIAGNOSIS — R222 Localized swelling, mass and lump, trunk: Secondary | ICD-10-CM | POA: Diagnosis not present

## 2017-10-18 DIAGNOSIS — R0789 Other chest pain: Secondary | ICD-10-CM | POA: Diagnosis not present

## 2017-10-18 DIAGNOSIS — K219 Gastro-esophageal reflux disease without esophagitis: Secondary | ICD-10-CM | POA: Diagnosis not present

## 2017-10-18 MED ORDER — ESOMEPRAZOLE MAGNESIUM 40 MG PO CPDR
40.0000 mg | DELAYED_RELEASE_CAPSULE | Freq: Every day | ORAL | 3 refills | Status: DC
Start: 2017-10-18 — End: 2018-02-13

## 2017-10-18 NOTE — Assessment & Plan Note (Signed)
We will send in prescription Nexium for him to continue.

## 2017-10-18 NOTE — Assessment & Plan Note (Addendum)
He will discontinue Trulicity as this may be contributing to some of his appetite issues and nausea and vomiting.  He will return for an A1c.

## 2017-10-18 NOTE — Assessment & Plan Note (Signed)
Possibly related to the Trulicity given that his symptoms started after starting on that medication.  Could be related to dietary changes.  Somewhat concerning that it sounds as though he has some early satiety.  Also a little concerning regarding the breast tenderness and discomfort as well as left supraclavicular discomfort.  We will have him discontinue the Trulicity.  Will obtain a mammogram and ultrasounds.  Will obtain a CT scan chest to evaluate the supraclavicular discomfort.  He will follow-up in 2 weeks for recheck with me.  We will request colonoscopy records.

## 2017-10-18 NOTE — Assessment & Plan Note (Signed)
Refer back to patient's dermatologist for evaluation.

## 2017-10-18 NOTE — Progress Notes (Signed)
[name], MD Phone: [phone]  Justin Patel is a 66 y.o. male who presents today for follow-up.  Diabetes: Fastings are less than 100.  160 after he eats.  He is taking Jardiance and Trulicity.  No polyuria or polydipsia.  No hypoglycemia.  He reports some breast soreness left greater than right over the last couple of months.  Typically is above his nipples or below his nipples.  No masses noted.  He reports some discomfort in his supraclavicular area on the left side with some tenderness and some slight discomfort on palpation of his anterior cervical chain of lymph nodes.  He reports he continues to have some rash on his arms and legs.  Has some slight red spots that appear to be around the follicles on his legs and upper arms.  He has not seen dermatology for this.  Prior lab work was unremarkable for cause.  He reports he has lost some weight with changes in his diet.  He has been eating less food and more low calorie foods.  He has been eating healthier.  He does note that it does not take much to fill him up.  He has had gastric bypass surgery though this is a little different over the last several months.  Does not like the look of certain foods.  Does have some nausea and vomiting following taking his Trulicity on Saturdays.  He does have a history of dumping syndrome and these episodes feel similar.  No symptoms now.  Patient additionally notes some lower abdominal discomfort only when he feels like he has to have a bowel movement.  Notes it is pretty significant at times.  Resolves with having a bowel movement.  He has bowel movement every 3 days.  No blood in his stool.  Passes normal stool.  No diarrhea.  No fevers.  No rectal pain.  Has been going on for 5-6 months.  Notes the issues in this paragraph have been going on since he started on Trulicity.  Last colonoscopy appears to be from 2012.  We will request records on this.  GERD: Taking Nexium.  No reflux or blood in  his stool.    Social History   Tobacco Use  Smoking Status Never Smoker  Smokeless Tobacco Never Used     ROS see history of present illness  Objective  Physical Exam Vitals:   10/18/17 1320  BP: 110/76  Pulse: 89  Temp: 98.5 F (36.9 C)  SpO2: 96%    BP Readings from Last 3 Encounters:  10/18/17 110/76  07/20/17 130/90  06/06/17 137/88   Wt Readings from Last 3 Encounters:  10/18/17 194 lb 9.6 oz (88.3 kg)  07/20/17 205 lb 12.8 oz (93.4 kg)  06/06/17 210 lb 12.8 oz (95.6 kg)    Physical Exam  Constitutional: No distress.  Cardiovascular: Normal rate, regular rhythm and normal heart sounds.  Pulmonary/Chest: Effort normal and breath sounds normal.  Abdominal: Soft. Bowel sounds are normal. He exhibits no distension. There is no tenderness. There is no rebound and no guarding.  Musculoskeletal: He exhibits no edema.  Bilateral chest and axillary areas with no masses, slight tenderness above the left nipple and at the left costochondral area, no apparent gynecomastia on exam, no skin changes, slight discomfort on palpation of the left supraclavicular area though no masses palpated, no adenopathy noted in either supraclavicular area or cervical area though there is slight fullness to bilateral superior clavicular areas  Neurological: He is alert. Gait  normal.  Skin: Skin is warm and dry. He is not diaphoretic.     Assessment/Plan: Please see individual problem list.  Rash Refer back to patient's dermatologist for evaluation.  Diabetes [facility]) He will discontinue Trulicity as this may be contributing to some of his appetite issues and nausea and vomiting.  He will return for an A1c.  Breast pain in male Has had some breast tenderness and discomfort in his left chest and also notes that in his right chest.  He has relatively benign exam other than some tenderness.  He does have costochondral tenderness that seems separate from the tenderness above his left nipple.   We will obtain a mammogram and ultrasounds to evaluate this.  Weight loss Possibly related to the Trulicity given that his symptoms started after starting on that medication.  Could be related to dietary changes.  Somewhat concerning that it sounds as though he has some early satiety.  Also a little concerning regarding the breast tenderness and discomfort as well as left supraclavicular discomfort.  We will have him discontinue the Trulicity.  Will obtain a mammogram and ultrasounds.  Will obtain a CT scan chest to evaluate the supraclavicular discomfort.  He will follow-up in 2 weeks for recheck with me.  We will request colonoscopy records.  GERD (gastroesophageal reflux disease) We will send in prescription Nexium for him to continue.  Constipation It sounds as though the patient is constipated.  Suspect this is what is causing his discomfort and it relieves after having a bowel movement.  Discussed fiber or MiraLAX over-the-counter.  If not improving he will let us know.   Orders Placed This Encounter  Procedures  . MM DIAG BREAST TOMO BILATERAL    Standing Status:   Future    Standing Expiration Date:   12/19/2018    Order Specific Question:   Reason for Exam (SYMPTOM  OR DIAGNOSIS REQUIRED)    Answer:   left breast tenderness superior to left nipple, no mass palpated    Order Specific Question:   Preferred imaging location?    Answer:   [facility]  . US BREAST LTD UNI RIGHT INC AXILLA    Standing Status:   Future    Standing Expiration Date:   12/19/2018    Order Specific Question:   Reason for Exam (SYMPTOM  OR DIAGNOSIS REQUIRED)    Answer:   left breast tenderness superior to left nipple, no mass palpated    Order Specific Question:   Preferred imaging location?    Answer:   [facility]  . US BREAST LTD UNI LEFT INC AXILLA    Standing Status:   Future    Standing Expiration Date:   12/19/2018    Order Specific Question:   Reason for Exam (SYMPTOM  OR DIAGNOSIS  REQUIRED)    Answer:   left breast tenderness superior to left nipple, no mass palpated    Order Specific Question:   Preferred imaging location?    Answer:   [facility]  . CT Chest W Contrast    Standing Status:   Future    Standing Expiration Date:   12/19/2018    Order Specific Question:   If indicated for the ordered procedure, I authorize the administration of contrast media per Radiology protocol    Answer:   Yes    Order Specific Question:   Preferred imaging location?    Answer:   [facility]    Order Specific Question:   Radiology Contrast Protocol -  do NOT remove file path    Answer:   [email or web address]  . Basic metabolic panel    Standing Status:   Future    Standing Expiration Date:   10/19/2018  . HgB A1c    Standing Status:   Future    Standing Expiration Date:   10/19/2018  . Ambulatory referral to Dermatology    Referral Priority:   Routine    Referral Type:   Consultation    Referral Reason:   Specialty Services Required    Requested Specialty:   Dermatology    Number of Visits Requested:   1    Meds ordered this encounter  Medications  . esomeprazole (NEXIUM) 40 MG capsule    Sig: Take 1 capsule (40 mg total) by mouth daily.    Dispense:  30 capsule    Refill:  [place], MD [place]

## 2017-10-18 NOTE — Assessment & Plan Note (Signed)
It sounds as though the patient is constipated.  Suspect this is what is causing his discomfort and it relieves after having a bowel movement.  Discussed fiber or MiraLAX over-the-counter.  If not improving he will let us know.

## 2017-10-18 NOTE — Assessment & Plan Note (Signed)
Has had some breast tenderness and discomfort in his left chest and also notes that in his right chest.  He has relatively benign exam other than some tenderness.  He does have costochondral tenderness that seems separate from the tenderness above his left nipple.  We will obtain a mammogram and ultrasounds to evaluate this.

## 2017-10-18 NOTE — Patient Instructions (Addendum)
Nice to see you. We will get you set up with dermatology. We will can set up for mammogram.  We will get you set up for CT scan of her chest. We will try to track down her colonoscopy results. Please discontinue the Trulicity. If you develop persistent abdominal pain, blood in your stool, or any new or changing symptoms please seek medical attention immediately.

## 2017-10-19 ENCOUNTER — Other Ambulatory Visit (INDEPENDENT_AMBULATORY_CARE_PROVIDER_SITE_OTHER): Payer: Medicare Other

## 2017-10-19 DIAGNOSIS — E119 Type 2 diabetes mellitus without complications: Secondary | ICD-10-CM | POA: Diagnosis not present

## 2017-10-19 LAB — BASIC METABOLIC PANEL
BUN: 38 mg/dL — AB (ref 6–23)
CO2: 30 meq/L (ref 19–32)
Calcium: 10.7 mg/dL — ABNORMAL HIGH (ref 8.4–10.5)
Chloride: 102 mEq/L (ref 96–112)
Creatinine, Ser: 1.54 mg/dL — ABNORMAL HIGH (ref 0.40–1.50)
GFR: 48.18 mL/min — ABNORMAL LOW (ref >60.00)
GLUCOSE: 100 mg/dL — AB (ref 70–99)
POTASSIUM: 4.4 meq/L (ref 3.5–5.1)
SODIUM: 141 meq/L (ref 135–145)

## 2017-10-19 LAB — HEMOGLOBIN A1C: Hgb A1c MFr Bld: 6.8 % — ABNORMAL HIGH (ref 4.6–6.5)

## 2017-10-20 ENCOUNTER — Encounter: Payer: Self-pay | Admitting: Family Medicine

## 2017-10-21 ENCOUNTER — Ambulatory Visit: Admission: RE | Admit: 2017-10-21 | Payer: Medicare Other | Source: Ambulatory Visit

## 2017-10-21 ENCOUNTER — Telehealth: Payer: Self-pay | Admitting: *Deleted

## 2017-10-21 NOTE — Telephone Encounter (Signed)
Please advise 

## 2017-10-21 NOTE — Telephone Encounter (Signed)
Copied from [place] [id number]. Topic: Quick Communication - See Telephone Encounter >> Oct 21, 2017 12:21 PM Antonieta Iba C wrote: CRM for notification. See Telephone encounter for: Susie with [facility] called in because she said that pt was scheduled to come in today to have a scan but pt is allergic to contrast. She said that pt had to be rescheduled until Monday. She said that pt need a Rx sent in to his pharmacy so that he can be seen on Monday.   10/21/17.

## 2017-10-21 NOTE — Telephone Encounter (Signed)
I would defer the scan at this time and it will need to be deferred to later than Monday. Please contact patient to let him know we will need to determine the appropriate steps to take to pre-treat or consider ct scan without contrast. We will need to reschedule the Monday imaging test. Please let Rasheedah know.

## 2017-10-21 NOTE — Telephone Encounter (Signed)
Patient notified and I have messaged rasheedah per skype.

## 2017-10-22 ENCOUNTER — Other Ambulatory Visit: Payer: Self-pay | Admitting: Family Medicine

## 2017-10-22 DIAGNOSIS — N179 Acute kidney failure, unspecified: Secondary | ICD-10-CM

## 2017-10-22 NOTE — Telephone Encounter (Signed)
Please contact the patient and see what kind of reaction he had to contrast previously.  Please see if he knows how quickly it occurred after receiving the contrast, how long the symptoms lasted, and if he required any treatment for this.

## 2017-10-22 NOTE — Progress Notes (Signed)
B

## 2017-10-23 ENCOUNTER — Telehealth: Payer: Self-pay | Admitting: Family Medicine

## 2017-10-23 DIAGNOSIS — R222 Localized swelling, mass and lump, trunk: Secondary | ICD-10-CM

## 2017-10-23 DIAGNOSIS — R0789 Other chest pain: Secondary | ICD-10-CM

## 2017-10-23 DIAGNOSIS — Z86010 Personal history of colonic polyps: Secondary | ICD-10-CM

## 2017-10-23 NOTE — Telephone Encounter (Signed)
Received colonoscopy report. Please let the patient know that they recommended short term follow-up of his colonoscopy given poor prep at the time of his prior colonoscopy. I am going to refer him to GI to consider repeat.

## 2017-10-24 ENCOUNTER — Encounter: Payer: Self-pay | Admitting: *Deleted

## 2017-10-24 ENCOUNTER — Ambulatory Visit: Payer: Medicare Other

## 2017-10-24 NOTE — Telephone Encounter (Signed)
They said that if he had to have pretreatment on his last CT's then he would need to have the pretreatment with the one that you are going to order. If he did not, then he does not need the pretreatment.

## 2017-10-24 NOTE — Telephone Encounter (Signed)
Patient states he had hives and he broke out right after receiving the contrast but did not have any SOB only hives. Patient states they only lasted a few hours. Patient states he has had contrast since then and did not experience any hives and had no prep.

## 2017-10-24 NOTE — Addendum Note (Signed)
Addended by: Caryl Bis Chereese Cilento G on: 10/24/2017 12:49 PM   Modules accepted: Orders

## 2017-10-24 NOTE — Telephone Encounter (Signed)
Noted.  Looking back it does appear that he has had multiple CT scans with contrast since then without apparent pretreatment. I will forward to [facility] so she can check with radiology to determine appropriateness of pretreatment given lack of recurrence of hives with recent imaging studies with contrast.  We will then determine the next step in obtaining imaging.

## 2017-10-24 NOTE — Telephone Encounter (Signed)
Patient notified and would like Dr.Elliott

## 2017-10-24 NOTE — Telephone Encounter (Signed)
Routing to [place] to make sure he see's [place] for this.

## 2017-10-25 ENCOUNTER — Ambulatory Visit
Admission: RE | Admit: 2017-10-25 | Discharge: 2017-10-25 | Disposition: A | Discharge status: Home / Self Care | Payer: Medicare Other | Source: Ambulatory Visit | Attending: Family Medicine | Admitting: Family Medicine

## 2017-10-25 DIAGNOSIS — N644 Mastodynia: Secondary | ICD-10-CM

## 2017-10-25 NOTE — Telephone Encounter (Signed)
Attempted to contact the patient regarding his prior CTs.  There was no answer.  Please attempt to contact the patient again and reconfirm that he had no pretreatment with prednisone or Benadryl prior to his most recent CT scans with contrast.  If he did not then we can proceed with the CT scan without pretreatment.  Please also let him know that his mammogram revealed fat necrosis in 1 of the areas of his discomfort which is a benign finding and likely related to some injury he undertook.  No need for further follow-up unless he develops new issues or does not improve.

## 2017-10-26 ENCOUNTER — Other Ambulatory Visit (INDEPENDENT_AMBULATORY_CARE_PROVIDER_SITE_OTHER): Payer: Medicare Other

## 2017-10-26 ENCOUNTER — Telehealth: Payer: Self-pay | Admitting: Family Medicine

## 2017-10-26 DIAGNOSIS — N179 Acute kidney failure, unspecified: Secondary | ICD-10-CM | POA: Diagnosis not present

## 2017-10-26 NOTE — Telephone Encounter (Signed)
Sorry for the multiple messages though please clarify what they mean by anaphylactic reaction to the dye in the past.  I was under the impression that he had hives in the past.  Please find out if he had any breathing issues, tongue swelling, or throat swelling.  Once I know this I can order the CT scan appropriately.

## 2017-10-26 NOTE — Telephone Encounter (Signed)
Pt. returned call re: inquiry on any pretreatment, prior to previous CT Scans with contrast.  Related hx of having IVP about 45 yrs. 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  Pt. recalled his last CT of Lumbar Spine with contrast from 12/2016;  reported he does not recall taking any oral Benadryl or Prednisone, or receiving any IV premed, prior to the CT Scan.  Denied any issues with the contrast at that time.  Stated "I feel like the contrast dye has probably improved from 45 years ago."   Stated he prefers to schedule the CT scan next week, and prefers not to have it scheduled too early in the AM.  Advised will send message to Dr. Caryl Bis.

## 2017-10-26 NOTE — Telephone Encounter (Signed)
Left message to return call ok for pec to speak to patient about message below

## 2017-10-27 LAB — BASIC METABOLIC PANEL
BUN: 29 mg/dL — ABNORMAL HIGH (ref 6–23)
CHLORIDE: 104 meq/L (ref 96–112)
CO2: 24 mEq/L (ref 19–32)
CREATININE: 1.33 mg/dL (ref 0.40–1.50)
Calcium: 9.7 mg/dL (ref 8.4–10.5)
GFR: 57.05 mL/min — AB (ref >60.00)
Glucose, Bld: 133 mg/dL — ABNORMAL HIGH (ref 70–99)
POTASSIUM: 4.1 meq/L (ref 3.5–5.1)
Sodium: 141 mEq/L (ref 135–145)

## 2017-10-27 NOTE — Telephone Encounter (Signed)
Patient states he had huge hives all over his body and throat. He states he did not have any breathing problems. He states his mouth was swelling but it was from the hives, he states he did not feel like his throat was closing. Patients last CT with no prep was last year in the summer 2018.

## 2017-10-27 NOTE — Telephone Encounter (Signed)
See other message

## 2017-10-27 NOTE — Telephone Encounter (Signed)
Sorry for the multiple messages though please clarify what they mean by anaphylactic reaction to the dye in the past.  I was under the impression that he had hives in the past.  Please find out if he had any breathing issues, tongue swelling, or throat swelling.  Once I know this I can order the CT scan appropriately.      Documentation      Nanci Pina, LPN routed conversation to [name], MD 19 hours ago (3:00 PM)     Pullins, Joline Salt, RN  [facility] 19 hours ago (2:53 PM)   Please forward to Dr. Caryl Bis; pt. reported information on premedication with CT Scan   Routing comment     Pullins, Joline Salt, RN 19 hours ago (2:33 PM)     Pt. returned call re: inquiry on any pretreatment, prior to previous CT Scans with contrast.  Related hx of having IVP about 45 yrs. 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  Pt. recalled his last CT of Lumbar Spine with contrast from 12/2016;  reported he does not recall taking any oral Benadryl or Prednisone, or receiving any IV premed, prior to the CT Scan.  Denied any issues with the contrast at that time.  Stated "I feel like the contrast dye has probably improved from 45 years ago."   Stated he prefers to schedule the CT scan next week, and prefers not to have it scheduled too early in the AM.  Advised will send message to Dr. Caryl Bis.

## 2017-10-28 NOTE — Telephone Encounter (Signed)
Noted.  CT scan reordered.  Based on our referral coordinators prior discussions with radiology he does not need premedication given that he has not had an allergic reaction to contrast on his most recent scans when he did not receive premedication.

## 2017-10-28 NOTE — Addendum Note (Signed)
Addended by: Caryl Bis, ERIC G on: 10/28/2017 01:04 PM   Modules accepted: Orders

## 2017-11-02 ENCOUNTER — Encounter: Payer: Self-pay | Admitting: Family Medicine

## 2017-11-02 ENCOUNTER — Ambulatory Visit: Payer: Medicare Other | Admitting: Family Medicine

## 2017-11-02 VITALS — BP 110/80 | HR 90 | Temp 98.0°F | Wt 190.0 lb

## 2017-11-02 DIAGNOSIS — D7589 Other specified diseases of blood and blood-forming organs: Secondary | ICD-10-CM | POA: Insufficient documentation

## 2017-11-02 DIAGNOSIS — R21 Rash and other nonspecific skin eruption: Secondary | ICD-10-CM | POA: Diagnosis not present

## 2017-11-02 DIAGNOSIS — R634 Abnormal weight loss: Secondary | ICD-10-CM

## 2017-11-02 DIAGNOSIS — Z125 Encounter for screening for malignant neoplasm of prostate: Secondary | ICD-10-CM

## 2017-11-02 DIAGNOSIS — M5136 Other intervertebral disc degeneration, lumbar region: Secondary | ICD-10-CM

## 2017-11-02 LAB — COMPREHENSIVE METABOLIC PANEL
ALBUMIN: 4.2 g/dL (ref 3.5–5.2)
ALT: 49 U/L (ref 0–53)
AST: 29 U/L (ref 0–37)
Alkaline Phosphatase: 50 U/L (ref 39–117)
BUN: 27 mg/dL — AB (ref 6–23)
CHLORIDE: 104 meq/L (ref 96–112)
CO2: 26 meq/L (ref 19–32)
CREATININE: 1.26 mg/dL (ref 0.40–1.50)
Calcium: 10.1 mg/dL (ref 8.4–10.5)
GFR: 60.72 mL/min (ref >60.00)
Glucose, Bld: 180 mg/dL — ABNORMAL HIGH (ref 70–99)
Potassium: 4.3 mEq/L (ref 3.5–5.1)
SODIUM: 140 meq/L (ref 135–145)
Total Bilirubin: 0.4 mg/dL (ref 0.2–1.2)
Total Protein: 7.4 g/dL (ref 6.0–8.3)

## 2017-11-02 LAB — CBC WITH DIFFERENTIAL/PLATELET
BASOS PCT: 0.4 % (ref 0.0–3.0)
Basophils Absolute: 0.1 10*3/uL (ref 0.0–0.1)
Eosinophils Absolute: 0.2 10*3/uL (ref 0.0–0.7)
Eosinophils Relative: 1.4 % (ref 0.0–5.0)
HCT: 49.6 % (ref 39.0–52.0)
HEMOGLOBIN: 16.4 g/dL (ref 13.0–17.0)
Lymphocytes Relative: 18.9 % (ref 12.0–46.0)
Lymphs Abs: 2.4 10*3/uL (ref 0.7–4.0)
MCHC: 33.1 g/dL (ref 30.0–36.0)
MCV: 99.2 fl (ref 78.0–100.0)
Monocytes Absolute: 0.7 10*3/uL (ref 0.1–1.0)
Monocytes Relative: 5.4 % (ref 3.0–12.0)
Neutro Abs: 9.5 10*3/uL — ABNORMAL HIGH (ref 1.4–7.7)
Neutrophils Relative %: 73.9 % (ref 43.0–77.0)
PLATELETS: 297 10*3/uL (ref 150.0–400.0)
RBC: 5 Mil/uL (ref 4.22–5.81)
RDW: 16.1 % — AB (ref 11.5–15.5)
WBC: 12.9 10*3/uL — AB (ref 4.0–10.5)

## 2017-11-02 LAB — PSA: PSA: 2.05 ng/mL (ref 0.10–4.00)

## 2017-11-02 LAB — TSH: TSH: 0.88 u[IU]/mL (ref 0.35–4.50)

## 2017-11-02 LAB — VITAMIN B12: Vitamin B-12: >1500 pg/mL — ABNORMAL HIGH (ref 211–911)

## 2017-11-02 LAB — SEDIMENTATION RATE: Sed Rate: 7 mm/hr (ref 0–20)

## 2017-11-02 LAB — FOLATE: Folate: 17.2 ng/mL (ref >5.9)

## 2017-11-02 NOTE — Progress Notes (Signed)
[name], MD Phone: [phone]  Justin Patel is a 67 y.o. male who presents today for f/u.  Presents for follow-up of weight loss.  He has unintentionally lost weight recently.  He had gastric bypass surgery and lost a fair amount of weight though put a bunch of weight back on and then progressively started to lose some weight somewhat purposefully though recently has lost a drastic amount without trying.  He has also had decreased appetite and gets full more easily than prior.  Not eating very much.  He does like cottage cheese mixed with fruit.  He notes no abdominal pain, blood in his stool, or change in his stool color.  He notes no diffuse itching though has focal areas of itching on his left elbow and his back.  He notes no rash.  He notes the discomfort in his supraclavicular area has resolved though he has a little bit of discomfort in his anterior cervical chain still.  He notes some sinus congestion and drainage as well.  He has not had prostate cancer screening in some time.  He does have an appointment with GI to discuss his appetite issues and early satiety.  He will likely need an endoscopy and colonoscopy.  Did discontinue the Trulicity.  He continues to have back pain and he does see the surgeon in June after he gets his second Prolia shot.  He notes no bowel or bladder incontinence or saddle anesthesia.  He has not yet seen dermatology as he has not contacted them yet for the rash on his legs.  Social History   Tobacco Use  Smoking Status Never Smoker  Smokeless Tobacco Never Used     ROS see history of present illness  Objective  Physical Exam Vitals:   11/02/17 1513  BP: 110/80  Pulse: 90  Temp: 98 F (36.7 C)  SpO2: 95%    BP Readings from Last 3 Encounters:  11/02/17 110/80  10/18/17 110/76  07/20/17 130/90   Wt Readings from Last 3 Encounters:  11/02/17 190 lb (86.2 kg)  10/18/17 194 lb 9.6 oz (88.3 kg)  07/20/17 205 lb 12.8 oz (93.4 kg)     Physical Exam  Constitutional: No distress.  Neck: Neck supple.  No cervical adenopathy though slight discomfort on palpation of the upper anterior cervical chain, no supraclavicular tenderness  Cardiovascular: Normal rate, regular rhythm and normal heart sounds.  Pulmonary/Chest: Effort normal and breath sounds normal.  Abdominal: Soft. Bowel sounds are normal. He exhibits no distension. There is no tenderness. There is no rebound and no guarding.  Musculoskeletal: He exhibits no edema.  Neurological: He is alert. Gait normal.  Skin: Skin is warm and dry. He is not diaphoretic.  Similar scattered erythematous rash on his legs that seems to be associated with follicles     Assessment/Plan: Please see individual problem list.  Weight loss He has lost another 4 or so pounds since his last visit.  The most worrisome component to me is his early satiety and lack of appetite concerning for a gastric issue.  He is going to see GI and I suspect he will need an upper endoscopy as well as a likely colonoscopy.  He will have his CT scan of his chest next week.  I suspect the discomfort in the anterior cervical chain is likely related to his sinus congestion and drainage.  If not improving could consider imaging of this area as well.  Will obtain lab work as outlined below to  evaluate for potential other causes.  QuantiFERON gold obtained given his report of a history of tuberculosis in his mother when he was young.  He has had no symptoms of tuberculosis.  Rash Will place a referral for him to see dermatology.  DDD (degenerative disc disease), lumbar He is going to keep his appointment with neurosurgery in June.  Macrocytosis Previously macrocytic.  Suspect related to B12 and folate deficiency.  Those have been repleted and we will recheck those today.   Orders Placed This Encounter  Procedures  . B12  . Folate  . CBC w/Diff  . Comp Met (CMET)  . PSA  . TSH  . Sedimentation rate  .  QuantiFERON-TB Gold Plus  . Ambulatory referral to Dermatology    Referral Priority:   Routine    Referral Type:   Consultation    Referral Reason:   Specialty Services Required    Requested Specialty:   Dermatology    Number of Visits Requested:   1    No orders of the defined types were placed in this encounter.    [name], MD [place]

## 2017-11-02 NOTE — Assessment & Plan Note (Signed)
Previously macrocytic.  Suspect related to B12 and folate deficiency.  Those have been repleted and we will recheck those today.

## 2017-11-02 NOTE — Telephone Encounter (Signed)
Yes it has been scheduled

## 2017-11-02 NOTE — Patient Instructions (Signed)
Nice to see you. We will check some lab work and contact you with the results. We will get you into see dermatology as well.

## 2017-11-02 NOTE — Assessment & Plan Note (Signed)
Will place a referral for him to see dermatology.

## 2017-11-02 NOTE — Assessment & Plan Note (Signed)
He is going to keep his appointment with neurosurgery in June.

## 2017-11-02 NOTE — Assessment & Plan Note (Signed)
He has lost another 4 or so pounds since his last visit.  The most worrisome component to me is his early satiety and lack of appetite concerning for a gastric issue.  He is going to see GI and I suspect he will need an upper endoscopy as well as a likely colonoscopy.  He will have his CT scan of his chest next week.  I suspect the discomfort in the anterior cervical chain is likely related to his sinus congestion and drainage.  If not improving could consider imaging of this area as well.  Will obtain lab work as outlined below to evaluate for potential other causes.  QuantiFERON gold obtained given his report of a history of tuberculosis in his mother when he was young.  He has had no symptoms of tuberculosis.

## 2017-11-03 ENCOUNTER — Telehealth: Payer: Self-pay

## 2017-11-03 NOTE — Telephone Encounter (Signed)
Please advise 

## 2017-11-03 NOTE — Telephone Encounter (Signed)
Copied from [place]. Topic: Quick Communication - Lab Results >> Nov 03, 2017  2:06 PM Synthia Innocent wrote: Requesting lab results

## 2017-11-03 NOTE — Telephone Encounter (Signed)
Please let the patient know that his blood sugar was slightly elevated at 180.  His B12 is now slightly elevated consistent with his B12 supplementation.  He can discontinue the B12 supplement at this time.  We will need to recheck this at some point in the next few months.  His folate is now normal.  He should continue the folate supplement.  His PSA is in the normal range though has increased from its last check 3 years ago.  I would suggest urology referral given that it has increased.  His white blood cell count is slightly elevated.  I would suggest rechecking this in a couple of weeks.  His other lab tests thus far are otherwise acceptable.  We are still awaiting the TB test.  Thanks.

## 2017-11-04 ENCOUNTER — Other Ambulatory Visit: Payer: Self-pay | Admitting: Family Medicine

## 2017-11-04 DIAGNOSIS — R7612 Nonspecific reaction to cell mediated immunity measurement of gamma interferon antigen response without active tuberculosis: Secondary | ICD-10-CM

## 2017-11-04 LAB — QUANTIFERON-TB GOLD PLUS
NIL: 0.02 IU/mL
QuantiFERON-TB Gold Plus: POSITIVE — AB
TB1-NIL: 7.46 [IU]/mL
TB2-NIL: 7.14 IU/mL

## 2017-11-04 NOTE — Telephone Encounter (Signed)
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

## 2017-11-04 NOTE — Telephone Encounter (Signed)
Please sign encounter to close when completed.

## 2017-11-05 NOTE — Telephone Encounter (Signed)
Chest CT should be ok to evaluate to start with. He may need a chest x-ray. We will need to make sure this test was reported to the health department as he will need some form of treatment and may need other testing in addition to the imaging. Please contact the health department on Monday to ensure they received the results from the lab. Please let the radiology department know about this positive test on Monday morning as it may alter how they mask the patient and themselves. Thanks.

## 2017-11-07 ENCOUNTER — Ambulatory Visit: Admission: RE | Admit: 2017-11-07 | Payer: Medicare Other | Source: Ambulatory Visit

## 2017-11-07 ENCOUNTER — Ambulatory Visit
Admission: RE | Admit: 2017-11-07 | Discharge: 2017-11-07 | Disposition: A | Discharge status: Home / Self Care | Payer: Medicare Other | Source: Ambulatory Visit | Attending: Family Medicine | Admitting: Family Medicine

## 2017-11-07 DIAGNOSIS — R222 Localized swelling, mass and lump, trunk: Secondary | ICD-10-CM | POA: Insufficient documentation

## 2017-11-07 DIAGNOSIS — I2510 Atherosclerotic heart disease of native coronary artery without angina pectoris: Secondary | ICD-10-CM | POA: Insufficient documentation

## 2017-11-07 DIAGNOSIS — D71 Functional disorders of polymorphonuclear neutrophils: Secondary | ICD-10-CM | POA: Insufficient documentation

## 2017-11-07 DIAGNOSIS — R0789 Other chest pain: Secondary | ICD-10-CM | POA: Insufficient documentation

## 2017-11-07 MED ORDER — IOPAMIDOL (ISOVUE-300) INJECTION 61%
75.0000 mL | Freq: Once | INTRAVENOUS | Status: AC | PRN
  Administered 2017-11-07: 75 mL via INTRAVENOUS

## 2017-11-07 NOTE — Telephone Encounter (Signed)
Notified health department and radiology

## 2017-11-09 ENCOUNTER — Telehealth: Payer: Self-pay | Admitting: Family Medicine

## 2017-11-09 NOTE — Telephone Encounter (Signed)
Please let the patient know that there are no causes noted for the discomfort or weight loss on his CT scan.  There are findings consistent with prior granulomatous disease which would be related to an infection previously.  He has no findings of active TB.  He has mild coronary calcifications.  He does appear to have a hiatal hernia that is residual from prior repair.  There is also a little bit of thickening of his esophagus which could be related to reflux though he needs to see GI for that to consider undergoing and EGD.  There also appears to have been an old sternal fracture and rib fractures on the left.  I received a message regarding his TB test and him deferring further evaluation and workup.  I would encourage him to go through the health department to have this worked up and evaluated to see if he would need treatment for TB.  Thanks.

## 2017-11-09 NOTE — Telephone Encounter (Signed)
Please advised  

## 2017-11-09 NOTE — Telephone Encounter (Unsigned)
Copied from [place] [id number]. Topic: Quick Communication - See Telephone Encounter >> Nov 09, 2017 11:15 AM Percell Belt A wrote: CRM for notification. See Telephone encounter for: 11/09/17. Pt called requesting a call back with CT result as soon as they have been resulted   Best number [phone]

## 2017-11-09 NOTE — Telephone Encounter (Signed)
Left message to return call, ok for pec to speak to patient and inform him that he does need to go be evaluated at the health department given his night sweats and weight loss  Called Christy at the [facility], please transfer to me once they call back

## 2017-11-09 NOTE — Telephone Encounter (Signed)
Noted. Please advise him that given the night sweats I advise that he proceed with evaluation through the health department. Please also touch base with the health department regarding the newly reported night sweats and let them know he has reported night sweat symptoms. Please also inform them of the CT chest results and fax them the results.

## 2017-11-09 NOTE — Telephone Encounter (Signed)
Please advise 

## 2017-11-09 NOTE — Telephone Encounter (Signed)
Patient was notified by pec

## 2017-11-09 NOTE — Telephone Encounter (Signed)
Patient notified and states he may call the health department back for evaluation Patient states you had asked him if he was having night sweats and he states he has been having night sweats, not every night. He states it has been happening for about a month.

## 2017-11-14 ENCOUNTER — Ambulatory Visit
Admission: RE | Admit: 2017-11-14 | Discharge: 2017-11-14 | Disposition: A | Discharge status: Home / Self Care | Payer: Medicare Other | Source: Ambulatory Visit | Attending: Family Medicine | Admitting: Family Medicine

## 2017-11-14 ENCOUNTER — Other Ambulatory Visit (HOSPITAL_COMMUNITY): Payer: Self-pay | Admitting: Family Medicine

## 2017-11-14 ENCOUNTER — Telehealth: Payer: Self-pay | Admitting: Family Medicine

## 2017-11-14 DIAGNOSIS — R918 Other nonspecific abnormal finding of lung field: Secondary | ICD-10-CM | POA: Diagnosis not present

## 2017-11-14 DIAGNOSIS — R7612 Nonspecific reaction to cell mediated immunity measurement of gamma interferon antigen response without active tuberculosis: Secondary | ICD-10-CM

## 2017-11-14 DIAGNOSIS — R634 Abnormal weight loss: Secondary | ICD-10-CM

## 2017-11-14 DIAGNOSIS — I700 Atherosclerosis of aorta: Secondary | ICD-10-CM | POA: Insufficient documentation

## 2017-11-14 NOTE — Telephone Encounter (Signed)
Pt called stating that he spoke to Dr. Caryl Bis recently (10/18/17) regarding what he thought was a rash but now it looks like bruising , "If they come open they are itchy."  Pt requesting to speak only with Dr. Caryl Bis re: this issue.  Best number [phone]

## 2017-11-14 NOTE — Telephone Encounter (Signed)
Please advise 

## 2017-11-14 NOTE — Telephone Encounter (Signed)
Copied from [place] [id number]. Topic: Quick Communication - See Telephone Encounter >> Nov 14, 2017 12:53 PM Hewitt Shorts wrote: CRM for notification. See Telephone encounter for: 11/14/17. Pt called stating that he has taled to [facility] recently regarding what he thought was a rash but now it looks like bursies and now if they come open they itch  Pt is only wanting to see Dr. Caryl Bis and he seems very anxious about this and needs to talk with him  Best number [phone]

## 2017-11-14 NOTE — Telephone Encounter (Signed)
Patient needs to see dermatology for this as we discussed previously. Has he been contacted by them yet. Also was he able to go see the health department for the positive TB test? Thanks.

## 2017-11-15 ENCOUNTER — Telehealth: Payer: Self-pay

## 2017-11-15 NOTE — Telephone Encounter (Signed)
Patient states he has not been called by dermatology but he has been seen at the health department for the TB situation. Please contact pt. He would like Dr. Caryl Bis to call him.

## 2017-11-15 NOTE — Telephone Encounter (Signed)
Justin Patel, they state they received referral on 11-07-17 and they will be sending an appointment date for Justin Patel today.

## 2017-11-15 NOTE — Telephone Encounter (Signed)
fyi

## 2017-11-15 NOTE — Telephone Encounter (Signed)
[phone] 

## 2017-11-15 NOTE — Telephone Encounter (Signed)
Spoke with patient.  He notes he has developed increasing rash on his legs that is somewhat itchy.  He notes other scattered areas of itching.  He notes some possible cracks in his skin as well in certain areas.  He has also developed some sporadic bruising underneath his armpits and over his legs.  Prior CBC was unremarkable with normal platelets.  Liver function testing has been normal as well.  He continues to have some early satiety and epigastric discomfort when he eats.  He has an appointment with GI and dermatology in the next 7-10 days.  I discussed that he needs to keep those appointments.  Given the continued weight loss we will need to obtain CT abdomen and pelvis and we will try to get this done before he sees GI.  I advised the patient that the CT scan was reassuring.  He is worried about possible leukemia, lymphoma, or pancreatic cancer based on his Internet research.  I advised that there were no findings concerning for lymphoma on the chest CT scan.  His WBC was minimally elevated though not significantly elevated.  Discussed at this point we will obtain a CT scan of his abdomen and pelvis.  We will need to consider rechecking his CBC in the future as well.  He will keep his appointments with his specialist.  We will then determine the next step in management.  He can trial Claritin for itching.

## 2017-11-15 NOTE — Telephone Encounter (Signed)
Left voicemail for patient that he has an appointment scheduled with Dr. Nehemiah Massed at [facility] on 11-21-17 at 10:45 and if that date and time does not work for him then he can call their office to reschedule that appointment ( number for [facility] left) and for him to call the office if he has any questions or concerns.

## 2017-11-16 ENCOUNTER — Encounter (INDEPENDENT_AMBULATORY_CARE_PROVIDER_SITE_OTHER): Payer: Self-pay

## 2017-11-16 NOTE — Telephone Encounter (Signed)
He is scheduled on Monday 4/15. mychart message has been sent to him.

## 2017-11-21 ENCOUNTER — Telehealth: Payer: Self-pay | Admitting: *Deleted

## 2017-11-21 ENCOUNTER — Ambulatory Visit
Admission: RE | Admit: 2017-11-21 | Discharge: 2017-11-21 | Disposition: A | Discharge status: Home / Self Care | Payer: Medicare Other | Source: Ambulatory Visit | Attending: Family Medicine | Admitting: Family Medicine

## 2017-11-21 DIAGNOSIS — I700 Atherosclerosis of aorta: Secondary | ICD-10-CM | POA: Diagnosis not present

## 2017-11-21 DIAGNOSIS — N289 Disorder of kidney and ureter, unspecified: Secondary | ICD-10-CM | POA: Insufficient documentation

## 2017-11-21 DIAGNOSIS — Z9884 Bariatric surgery status: Secondary | ICD-10-CM | POA: Diagnosis not present

## 2017-11-21 DIAGNOSIS — M419 Scoliosis, unspecified: Secondary | ICD-10-CM | POA: Diagnosis not present

## 2017-11-21 DIAGNOSIS — R634 Abnormal weight loss: Secondary | ICD-10-CM | POA: Diagnosis not present

## 2017-11-21 DIAGNOSIS — K5730 Diverticulosis of large intestine without perforation or abscess without bleeding: Secondary | ICD-10-CM | POA: Diagnosis not present

## 2017-11-21 DIAGNOSIS — Z9889 Other specified postprocedural states: Secondary | ICD-10-CM | POA: Insufficient documentation

## 2017-11-21 MED ORDER — IOPAMIDOL (ISOVUE-300) INJECTION 61%
100.0000 mL | Freq: Once | INTRAVENOUS | Status: AC | PRN
  Administered 2017-11-21: 100 mL via INTRAVENOUS

## 2017-11-21 NOTE — Telephone Encounter (Signed)
lvm at [facility] to rtc [id number]

## 2017-11-21 NOTE — Telephone Encounter (Signed)
Copied from [place] [id number]. Topic: Referral - Status >> Nov 21, 2017  8:32 AM Synthia Innocent wrote: Reason for CRM: Patient checking status of dermatology referral, requesting call back

## 2017-11-22 ENCOUNTER — Telehealth: Payer: Self-pay

## 2017-11-22 DIAGNOSIS — N420 Calculus of prostate: Secondary | ICD-10-CM

## 2017-11-22 MED ORDER — AMOXICILLIN-POT CLAVULANATE 875-125 MG PO TABS: 1.0000 | ORAL_TABLET | Freq: Two times a day (BID) | ORAL | 0 refills | Status: DC

## 2017-11-22 MED ORDER — AMOXICILLIN-POT CLAVULANATE 875-125 MG PO TABS: 1.0000 | ORAL_TABLET | Freq: Three times a day (TID) | ORAL | 0 refills | Status: DC

## 2017-11-22 NOTE — Telephone Encounter (Signed)
Called patient to notify him of results. Patient states that he does have some pain in the left side especially when going to the bathroom. He does have some diarrhear. He also states that he still does not have an appetite. He has an appointment with Dr. Tiffany Kocher at [facility] on 11-23-16. Patient requested message be sent to [facility] so I sent it to him via mychart.

## 2017-11-22 NOTE — Telephone Encounter (Signed)
Spoke with patient regarding results.  He has been having left-sided abdominal discomfort particularly when going to the bathroom.  He has had some diarrhea as well.  He notes this is consistent with his prior episodes of diverticulitis.  Given the findings on the CT scan and his symptoms I discussed treatment with an antibiotic.  He has a Levaquin allergy and thus we decided on Augmentin.  Per up-to-date Augmentin dosing is 3 times daily for treatment of acute diverticulitis.  Initially twice daily dosing was sent to pharmacy though I called the pharmacy and change the instructions and the number of pills dispensed.  He will see GI tomorrow for evaluation as well.  I will have the [facility] contact him tomorrow to follow-up and advise if he is not improving to be reevaluated.  He reports he is still not able to eat very much due to early satiety.  He has continued to lose weight.  He has seen dermatology and had a biopsy of his rash.

## 2017-11-22 NOTE — Telephone Encounter (Signed)
Please let the patient know the following regarding his CT scan.  He appears to have a slightly large common bile duct which is the connection between his gallbladder and intestines.  This appears to be similar to prior based on the report.  He should discuss this with GI to see if any further evaluation is needed.  They did not see a mass or gallstone to be causing this.  He has renal cysts which are benign and noncancerous.  There were findings consistent with his prior gastric bypass and prior hiatal hernia repair.  They noted slight thickening in the distal esophagus that was noted on his CT chest and they felt this may be related to under distention or reflux.  This should be evaluated by GI as well.  They additionally noted diverticulosis in his colon.  They also brought up the possibility of mild diverticulitis which is often related to an infection or inflammation.  Please see if he has been having any stomach pain on the left side or stomach pain elsewhere.  Has he had any diarrhea or fevers.  His prostate appears to have some calcifications in it.  I would like for him to see urology for this.  Please see if he has a urologist.  Please see if he has been scheduled with GI yet.  Thanks.

## 2017-11-22 NOTE — Telephone Encounter (Signed)
[facility] Radiology [phone]  CT/Abdomen Jerene Pitch Impression #1Descending colon and sigmoid colon diverticulosisPossibility of mild diverticulitis of mid descending colon is raised (series 3, image 31, series 7, image 190 and series 5, image 86).

## 2017-11-23 NOTE — Telephone Encounter (Signed)
Patient states that he went to GI today and he will return tomorrow to get a liver enzyme panel. He also states that he will be having a colonoscopy and endoscopy on May 21,2019 and he wanted to make sure that you knew that they prescribed him phenergan for the nausea. He stated he still feels fatigued but no mention of diarrhea.

## 2017-11-25 ENCOUNTER — Telehealth: Payer: Self-pay | Admitting: Family Medicine

## 2017-11-25 DIAGNOSIS — D72829 Elevated white blood cell count, unspecified: Secondary | ICD-10-CM

## 2017-11-25 NOTE — Telephone Encounter (Signed)
Copied from [place] [id number]. Topic: Quick Communication - See Telephone Encounter >> Nov 25, 2017  1:37 PM Vernona Rieger wrote: CRM for notification. See Telephone encounter for: 11/25/17.  Patient states that he was seen at [facility] on 4/18 for lab work. They gave him the results yesterday. He said that the enzymes were normal except a high white blood count that was elevated. He is scheduled for a colonoscopy and endoscopy for May 21 with Dr Alice Reichert.

## 2017-11-28 NOTE — Telephone Encounter (Signed)
Noted.  Given his elevated white blood cell count we could consider having him see hematology to see if that could be contributing to his weight loss.  If he is willing to do this referral I can place this.  Thanks.

## 2017-11-28 NOTE — Telephone Encounter (Signed)
fyi

## 2017-11-29 NOTE — Telephone Encounter (Signed)
Patient states he would like referral. Patient states he sees hematology for iron deficiency anemia Dr.Brahmanday.

## 2017-11-30 NOTE — Telephone Encounter (Signed)
Referral placed.

## 2017-12-07 ENCOUNTER — Encounter: Payer: Self-pay | Admitting: Family Medicine

## 2017-12-07 ENCOUNTER — Ambulatory Visit: Payer: Medicare Other | Admitting: Family Medicine

## 2017-12-07 DIAGNOSIS — R7612 Nonspecific reaction to cell mediated immunity measurement of gamma interferon antigen response without active tuberculosis: Secondary | ICD-10-CM | POA: Diagnosis not present

## 2017-12-07 DIAGNOSIS — R21 Rash and other nonspecific skin eruption: Secondary | ICD-10-CM

## 2017-12-07 DIAGNOSIS — R634 Abnormal weight loss: Secondary | ICD-10-CM

## 2017-12-07 NOTE — Patient Instructions (Signed)
Nice to see you. Please keep your appointment for your endoscopy and colonoscopy. We will get you into see hematology as well.  If you do not hear anything soon regarding this please contact us. 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

## 2017-12-07 NOTE — Assessment & Plan Note (Signed)
He has had an evaluation through dermatology for this.

## 2017-12-07 NOTE — Assessment & Plan Note (Signed)
Weight loss seems to have slowed a little bit though is still trending down.  He has several concerning findings on history which warrant further evaluation.  He is going to have an EGD and colonoscopy through GI to evaluate possible GI causes particularly given esophageal thickening on CT imaging.  We have also referred him to hematology for evaluation given slightly elevated WBC and his overall symptoms.  He may warrant a bone marrow biopsy.  If GI and hematologic work-up are unremarkable for cause we could have him see rheumatology given family history of rheumatologic disease and this was discussed with the patient.

## 2017-12-07 NOTE — Assessment & Plan Note (Signed)
He has had appropriate imaging.  He has been seen by the health department.  He is willing to undergo treatment and I reinforced that he needs to contact them to set that up.

## 2017-12-07 NOTE — Progress Notes (Signed)
[name], MD Phone: [phone]  Justin Patel is a 67 y.o. male who presents today for f/u.  Patient has undergone fairly extensive evaluation thus far for his weight loss.  He continues to lose a little bit of weight though it has slowed down.  He saw dermatology and had a biopsy of his rash that was noted to show a lymphocytic cell involvement around his vessels.  He was advised that this could or could not be indicative of something that is underlying.  He continues to have issues with the rash.  He still does not have much of an appetite.  He is fatigued most of the time.  Just does not have energy.  He is forcing himself to eat.  He continues to have some night sweats and itching.  He has bruising in some odd places.  Has loose stools intermittently.  He has seen GI and they are planning to do an EGD and colonoscopy for his symptoms.  They also placed him on Phenergan to help with nausea.  He has been referred to hematology though an appointment has not been set up yet given slightly elevated white blood cell count.  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  He advised them he would be willing to undergo treatment though he notes he was never informed regarding further treatment for this.  He notes his sister had lupus.  1 of his sisters had ovarian cancer.  1 of them had B-cell lymphoma.  He also has an appointment with urology.  Social History   Tobacco Use  Smoking Status Never Smoker  Smokeless Tobacco Never Used     ROS see history of present illness  Objective  Physical Exam Vitals:   12/07/17 1412  BP: 118/82  Pulse: 78  Temp: 98.7 F (37.1 C)  SpO2: 96%    BP Readings from Last 3 Encounters:  12/07/17 118/82  11/02/17 110/80  10/18/17 110/76   Wt Readings from Last 3 Encounters:  12/07/17 187 lb 6.4 oz (85 kg)  11/02/17 190 lb (86.2 kg)  10/18/17 194 lb 9.6 oz (88.3 kg)    Physical  Exam  Constitutional: No distress.  Cardiovascular: Normal rate, regular rhythm and normal heart sounds.  Pulmonary/Chest: Effort normal and breath sounds normal.  Abdominal: Soft. Bowel sounds are normal.  No distention, slight discomfort on palpation of the right upper quadrant that has been chronic  Musculoskeletal: He exhibits no edema.  Neurological: He is alert.  Skin: Skin is warm and dry. He is not diaphoretic.     Assessment/Plan: Please see individual problem list.  Weight loss Weight loss seems to have slowed a little bit though is still trending down.  He has several concerning findings on history which warrant further evaluation.  He is going to have an EGD and colonoscopy through GI to evaluate possible GI causes particularly given esophageal thickening on CT imaging.  We have also referred him to hematology for evaluation given slightly elevated WBC and his overall symptoms.  He may warrant a bone marrow biopsy.  If GI and hematologic work-up are unremarkable for cause we could have him see rheumatology given family history of rheumatologic disease and this was discussed with the patient.  Rash He has had an evaluation through dermatology for this.  Positive QuantiFERON-TB Gold test He has had appropriate imaging.  He has been seen by the health department.  He is willing to  undergo treatment and I reinforced that he needs to contact them to set that up.   No orders of the defined types were placed in this encounter.   No orders of the defined types were placed in this encounter.    [name], MD [place]

## 2017-12-08 ENCOUNTER — Telehealth: Payer: Self-pay | Admitting: Family Medicine

## 2017-12-08 NOTE — Telephone Encounter (Signed)
fyi

## 2017-12-08 NOTE — Telephone Encounter (Signed)
Does Dr Caryl Bis want pt to still have the DG chest 2 view? Pt had a DG chest 1 view last month ordered by another provider. Please advise? Thank you!

## 2017-12-08 NOTE — Telephone Encounter (Signed)
Please advise 

## 2017-12-08 NOTE — Telephone Encounter (Signed)
He does not need another chest x-ray.

## 2017-12-09 ENCOUNTER — Telehealth: Payer: Self-pay | Admitting: *Deleted

## 2017-12-09 ENCOUNTER — Inpatient Hospital Stay: Payer: Medicare Other | Attending: Internal Medicine | Admitting: Internal Medicine

## 2017-12-09 ENCOUNTER — Encounter: Payer: Self-pay | Admitting: Internal Medicine

## 2017-12-09 ENCOUNTER — Inpatient Hospital Stay: Payer: Medicare Other

## 2017-12-09 ENCOUNTER — Other Ambulatory Visit: Payer: Self-pay

## 2017-12-09 VITALS — BP 135/85 | HR 83 | Temp 97.6°F | Resp 20

## 2017-12-09 DIAGNOSIS — L299 Pruritus, unspecified: Secondary | ICD-10-CM | POA: Insufficient documentation

## 2017-12-09 DIAGNOSIS — Z801 Family history of malignant neoplasm of trachea, bronchus and lung: Secondary | ICD-10-CM | POA: Diagnosis not present

## 2017-12-09 DIAGNOSIS — D72829 Elevated white blood cell count, unspecified: Secondary | ICD-10-CM | POA: Diagnosis not present

## 2017-12-09 DIAGNOSIS — R61 Generalized hyperhidrosis: Secondary | ICD-10-CM | POA: Diagnosis not present

## 2017-12-09 DIAGNOSIS — R6881 Early satiety: Secondary | ICD-10-CM | POA: Diagnosis not present

## 2017-12-09 DIAGNOSIS — R634 Abnormal weight loss: Secondary | ICD-10-CM | POA: Insufficient documentation

## 2017-12-09 DIAGNOSIS — K219 Gastro-esophageal reflux disease without esophagitis: Secondary | ICD-10-CM | POA: Insufficient documentation

## 2017-12-09 DIAGNOSIS — I130 Hypertensive heart and chronic kidney disease with heart failure and stage 1 through stage 4 chronic kidney disease, or unspecified chronic kidney disease: Secondary | ICD-10-CM

## 2017-12-09 DIAGNOSIS — D509 Iron deficiency anemia, unspecified: Secondary | ICD-10-CM | POA: Diagnosis not present

## 2017-12-09 DIAGNOSIS — Z79899 Other long term (current) drug therapy: Secondary | ICD-10-CM | POA: Diagnosis not present

## 2017-12-09 DIAGNOSIS — M1990 Unspecified osteoarthritis, unspecified site: Secondary | ICD-10-CM | POA: Diagnosis not present

## 2017-12-09 DIAGNOSIS — R233 Spontaneous ecchymoses: Secondary | ICD-10-CM

## 2017-12-09 DIAGNOSIS — E1122 Type 2 diabetes mellitus with diabetic chronic kidney disease: Secondary | ICD-10-CM | POA: Insufficient documentation

## 2017-12-09 DIAGNOSIS — N189 Chronic kidney disease, unspecified: Secondary | ICD-10-CM

## 2017-12-09 DIAGNOSIS — Z9884 Bariatric surgery status: Secondary | ICD-10-CM | POA: Insufficient documentation

## 2017-12-09 DIAGNOSIS — E785 Hyperlipidemia, unspecified: Secondary | ICD-10-CM | POA: Diagnosis not present

## 2017-12-09 DIAGNOSIS — R21 Rash and other nonspecific skin eruption: Secondary | ICD-10-CM | POA: Diagnosis not present

## 2017-12-09 DIAGNOSIS — I700 Atherosclerosis of aorta: Secondary | ICD-10-CM | POA: Insufficient documentation

## 2017-12-09 DIAGNOSIS — G8929 Other chronic pain: Secondary | ICD-10-CM | POA: Diagnosis not present

## 2017-12-09 DIAGNOSIS — R7611 Nonspecific reaction to tuberculin skin test without active tuberculosis: Secondary | ICD-10-CM | POA: Diagnosis not present

## 2017-12-09 DIAGNOSIS — R630 Anorexia: Secondary | ICD-10-CM | POA: Diagnosis not present

## 2017-12-09 DIAGNOSIS — R238 Other skin changes: Secondary | ICD-10-CM

## 2017-12-09 DIAGNOSIS — Z8041 Family history of malignant neoplasm of ovary: Secondary | ICD-10-CM | POA: Diagnosis not present

## 2017-12-09 DIAGNOSIS — R5383 Other fatigue: Secondary | ICD-10-CM | POA: Diagnosis not present

## 2017-12-09 LAB — COMPREHENSIVE METABOLIC PANEL
ALBUMIN: 4.7 g/dL (ref 3.5–5.0)
ALT: 48 U/L (ref 17–63)
ANION GAP: 11 (ref 5–15)
AST: 31 U/L (ref 15–41)
Alkaline Phosphatase: 42 U/L (ref 38–126)
BUN: 28 mg/dL — ABNORMAL HIGH (ref 6–20)
CALCIUM: 10.1 mg/dL (ref 8.9–10.3)
CHLORIDE: 103 mmol/L (ref 101–111)
CO2: 24 mmol/L (ref 22–32)
Creatinine, Ser: 1.26 mg/dL — ABNORMAL HIGH (ref 0.61–1.24)
GFR calc non Af Amer: 58 mL/min — ABNORMAL LOW (ref >60)
Glucose, Bld: 171 mg/dL — ABNORMAL HIGH (ref 65–99)
POTASSIUM: 4.5 mmol/L (ref 3.5–5.1)
SODIUM: 138 mmol/L (ref 135–145)
Total Bilirubin: 0.5 mg/dL (ref 0.3–1.2)
Total Protein: 8 g/dL (ref 6.5–8.1)

## 2017-12-09 LAB — CBC WITH DIFFERENTIAL/PLATELET
BASOS ABS: 0 10*3/uL (ref 0–0.1)
Basophils Relative: 0 %
EOS PCT: 2 %
Eosinophils Absolute: 0.3 10*3/uL (ref 0–0.7)
HCT: 50.9 % (ref 40.0–52.0)
HEMOGLOBIN: 17 g/dL (ref 13.0–18.0)
LYMPHS PCT: 25 %
Lymphs Abs: 2.8 10*3/uL (ref 1.0–3.6)
MCH: 33.6 pg (ref 26.0–34.0)
MCHC: 33.5 g/dL (ref 32.0–36.0)
MCV: 100.2 fL — AB (ref 80.0–100.0)
Monocytes Absolute: 0.8 10*3/uL (ref 0.2–1.0)
Monocytes Relative: 7 %
NEUTROS PCT: 66 %
Neutro Abs: 7.1 10*3/uL — ABNORMAL HIGH (ref 1.4–6.5)
PLATELETS: 242 10*3/uL (ref 150–440)
RBC: 5.08 MIL/uL (ref 4.40–5.90)
RDW: 15.7 % — ABNORMAL HIGH (ref 11.5–14.5)
WBC: 11 10*3/uL — AB (ref 3.8–10.6)

## 2017-12-09 LAB — CK: CK TOTAL: 34 U/L — AB (ref 49–397)

## 2017-12-09 LAB — PROTIME-INR
INR: 0.98
PROTHROMBIN TIME: 12.9 s (ref 11.4–15.2)

## 2017-12-09 LAB — C-REACTIVE PROTEIN

## 2017-12-09 LAB — LACTATE DEHYDROGENASE: LDH: 157 U/L (ref 98–192)

## 2017-12-09 LAB — APTT: aPTT: 27 seconds (ref 24–36)

## 2017-12-09 NOTE — Progress Notes (Signed)
Patient being referred back to Dr. Rogue Bussing for abnormal labs (leukocytosis and elevated RBC). Per patient, He had a + quantiferon per cxr - Evidence of previous granulomatous infection. He was referred to the health dept for further work-up and evaluation. He reports an extensive rash on arms and legs, which is still present. He notes a 30 lb weight loss since December.  He reports fatigue. dermatology reports that he brought demonstrated perivascular lymphocytic infiltration per patient. pt highly anxious as his wife Rise Paganini went through chemo. Patient is worried that he has "leukemia." He also reports easy bruising. Patient reports night sweats at bedtime. He soaks through his clothing and in general has to change his clothing at least once at bedtime.  Patient has an endo and colonoscopy scheduled on 12/27/17. He has an apt with urology on 12/20/17 with Dr. Erlene Quan to f/u on h/o renal calculi and for a prostate exam.  Patient has a family h/o of lymphoma (sister), ovarian cancer (sister) and lupus (sister), lung cancer (4 paternal aunts died of lung cancer). and prostate cancer (pat. Uncle). He has a cousin with Mesothelioma.  Pt's family history was updated today.

## 2017-12-09 NOTE — Progress Notes (Signed)
[facility] NOTE  Patient Care Team: Justin Haven, MD as PCP - General (Family Medicine)  CHIEF COMPLAINTS/PURPOSE OF CONSULTATION: Leukocytosis  # Leucocytosis/   # # SDH [[place]- [id number]]; [name] has been referred to [place] again-for concerns for any malignancy.  Patient states that he has lost about 30 pounds in the last 4 months; unintentional.  He also complains of night sweats /drenching 3-4 times a week.  Complains of loss of muscle mass/complains of significant fatigue over the last few months.    Patient also diagnosed with a Skin rash-for more than a year.  Intense itching.  He had a recent skin biopsy with dermatology-that showed lymphocytic infiltrate no concerns for malignancy noted [ malignancy noted.  He also complains of easy bruising.  He had a CT scan of the chest abdomen pelvis -with PCP March/April 2019-that did not show any evidence of obvious concerns for lymphoma.   patient also complains of chronic joint pains.      Review of Systems  Constitutional: Positive for weight loss. Negative for chills, diaphoresis, fever and malaise/fatigue.  HENT: Negative.  Negative for hearing loss.   Eyes: Negative.   Respiratory: Negative.   Cardiovascular: Negative.   Gastrointestinal: Negative.   Genitourinary: Negative.   Musculoskeletal: Positive for joint pain.  Skin: Positive for itching and rash.  Neurological: Negative.   Endo/Heme/Allergies: Bruises/bleeds easily.  Psychiatric/Behavioral: Negative.      MEDICAL HISTORY:  Past Medical History:  Diagnosis Date  . 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 Anxiety   . Arthritis   . 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  removal scheduled to clear margins.  . CHF  (congestive heart failure) ([place])   . Chronic kidney disease   . Degenerative disc disease   . Diabetes mellitus    no longer diabetic-or on meds  . Gastric ulcer   . GERD (gastroesophageal reflux disease)   . H/O hiatal hernia   . Headache(784.0)   . Heart murmur   . Hx of congestive heart failure   . Hyperlipidemia   . Hypertension   . Iron deficiency anemia 02/28/2015  . Sacral fracture, closed ([place])   . Sleep apnea    hx not now since wt loss  . Stones in the urinary tract   . Syncope and collapse     SURGICAL HISTORY: Past Surgical History:  Procedure Laterality Date  . ANTERIOR CERVICAL DECOMP/DISCECTOMY FUSION  01/26/2012   Procedure: ANTERIOR CERVICAL DECOMPRESSION/DISCECTOMY FUSION 3 LEVELS;  Surgeon: Floyce Stakes, MD;  Location: [facility];  Service: Neurosurgery;  Laterality: N/A;  Cervical three-four Cervical four-five Cervical five-six Cervical six-seven , Anterior cervical decompression/diskectomy, fusion, plate  . APPENDECTOMY    . [place], normal  . CARDIAC CATHETERIZATION     [place]  . GASTRIC BYPASS  2010   [employer]  . HERNIA REPAIR  2000   hiatal  . JOINT REPLACEMENT    . KNEE ARTHROSCOPY     bilateral, left x 2  . NASAL SINUS SURGERY     x5  . OSTEOTOMY  2001   left  . TONSILLECTOMY    . TOTAL KNEE ARTHROPLASTY Left 05/2014  Dr. Marry Guan  . UVULOPALATOPLASTY  2011    SOCIAL HISTORY: Social History   Socioeconomic History  . Marital status: Married    Spouse name: Not on file  . Number of children: Not on file  . Years of education: Not on file  . Highest education level: Not on file  Occupational History  . Not on file  Social Needs  . Financial resource strain: Not on file  . Food insecurity:    Worry: Not on file    Inability: Not on file  . Transportation needs:    Medical: Not on file    Non-medical: Not on file  Tobacco Use  . Smoking status: Never Smoker  . Smokeless  tobacco: Never Used  Substance and Sexual Activity  . Alcohol use: No    Alcohol/week: 0.6 oz    Types: 1 Glasses of wine per week  . Drug use: No  . Sexual activity: Yes    Partners: Female  Lifestyle  . Physical activity:    Days per week: Not on file    Minutes per session: Not on file  . Stress: Not on file  Relationships  . Social connections:    Talks on phone: Not on file    Gets together: Not on file    Attends religious service: Not on file    Active member of club or organization: Not on file    Attends meetings of clubs or organizations: Not on file    Relationship status: Not on file  . Intimate partner violence:    Fear of current or ex partner: Not on file    Emotionally abused: Not on file    Physically abused: Not on file    Forced sexual activity: Not on file  Other Topics Concern  . Not on file  Social History Narrative   Lives in [place] with wife, Justin Patel. 2 sons, Justin Patel, Justin Patel 106.. 4 grandchildren      Work - Retired, previously Nurse, learning disability in [place] - regular diet, limited quantities after gastric bypass      Exercise - no regular, limited by arthritis in knees, occasional water aerobics    FAMILY HISTORY: Family History  Problem Relation Age of Onset  . Dementia Mother 57  . Heart disease Father 25  . Diabetes Father   . Lymphoma Sister        lymphoma, stage 4  . Ovarian cancer Sister 13       Ovarian  . Lupus Sister   . Prostate cancer Maternal Uncle   . Skin cancer Maternal Uncle   . Tongue cancer Maternal Uncle        uncle died of heart attack  . Alcoholism Maternal Uncle   . Lung cancer Paternal Aunt        pat aunts x 4 died of lung cancer  . Lung cancer Paternal Aunt   . Lung cancer Paternal Aunt   . Lung cancer Paternal Aunt   . Mesothelioma Cousin        paternal cousin    ALLERGIES:  is allergic to altace [ramipril]; levaquin [levofloxacin in O2V]; metformin; trulicity [dulaglutide];  conray [iothalamate]; and lyrica [pregabalin].  MEDICATIONS:  Current Outpatient Medications  Medication Sig Dispense Refill  . metoprolol succinate (TOPROL-XL) 50 MG 24 hr tablet Take 1 tablet (50 mg total) by mouth daily. Take with or immediately following a meal. 30 tablet 6  . nystatin cream (  MYCOSTATIN) Apply 1 application topically 2 (two) times daily. 30 g 0  . rosuvastatin (CRESTOR) 20 MG tablet Take 1 tablet (20 mg total) by mouth daily. 30 tablet 6  . sucralfate (CARAFATE) 1 g tablet Take 1 tablet (1 g total) by mouth 4 (four) times daily. 120 tablet 11  . tamsulosin (FLOMAX) 0.4 MG CAPS capsule TAKE 1 CAPSULE BY MOUTH ONCE DAILY 90 capsule 0  . Vitamin D, Ergocalciferol, (DRISDOL) 50000 units CAPS capsule Take 1 capsule (50,000 Units total) by mouth every 7 (seven) days. 8 capsule 0  . acetaminophen (TYLENOL) 500 MG tablet Take 1,000 mg by mouth every 6 (six) hours as needed.    . blood glucose meter kit and supplies KIT Dispense based on patient and insurance preference. Check once daily. ICD doing E11.9. 1 each 0  . DULoxetine (CYMBALTA) 60 MG capsule take 1 capsule by mouth once daily 30 capsule 5  . esomeprazole (NEXIUM) 40 MG capsule Take 1 capsule (40 mg total) by mouth daily. 30 capsule 3  . furosemide (LASIX) 20 MG tablet TAKE 1 TABLET BY MOUTH ONCE DAILY 90 tablet 0  . HYDROcodone-acetaminophen (NORCO) 7.5-325 MG tablet take 1 tablet by mouth every 6 hours if needed for pain  0  . JARDIANCE 25 MG TABS tablet take 1 tablet by mouth once daily 30 tablet 3  . losartan (COZAAR) 100 MG tablet TAKE 1 TABLET BY MOUTH DAILY 90 tablet 0   No current facility-administered medications for this visit.       [facility] Kitchen  PHYSICAL EXAMINATION: ECOG PERFORMANCE STATUS: 1 - Symptomatic but completely ambulatory  Vitals:   12/09/17 1500  BP: 135/85  Pulse: 83  Resp: 20  Temp: 97.6 F (36.4 C)   There were no vitals filed for this visit.  Physical Exam  Constitutional: He is oriented  to person, place, and time and well-developed, well-nourished, and in no distress.  HENT:  Head: Normocephalic and atraumatic.  Eyes: Pupils are equal, round, and reactive to light. EOM are normal.  Neck: Normal range of motion. Neck supple.  Cardiovascular: Normal rate, regular rhythm, normal heart sounds and intact distal pulses.  Pulmonary/Chest: Effort normal and breath sounds normal.  Abdominal: Soft. Bowel sounds are normal.  Musculoskeletal: Normal range of motion.  Neurological: He is alert and oriented to person, place, and time. Gait normal.  Skin: Skin is warm. Rash noted.  Psychiatric: Affect normal.     LABORATORY DATA:  I have reviewed the data as listed Lab Results  Component Value Date   WBC 11.0 (H) 12/09/2017   HGB 17.0 12/09/2017   HCT 50.9 12/09/2017   MCV 100.2 (H) 12/09/2017   PLT 242 12/09/2017   Recent Labs    05/27/17 1407  10/26/17 1052 11/02/17 1541 12/09/17 1619  NA 136   < > 141 140 138  K 4.3   < > 4.1 4.3 4.5  CL 100   < > 104 104 103  CO2 28   < > 24 26 24   GLUCOSE 200*   < > 133* 180* 171*  BUN 39*   < > 29* 27* 28*  CREATININE 1.30   < > 1.33 1.26 1.26*  CALCIUM 9.3   < > 9.7 10.1 10.1  GFRNONAA  --   --   --   --  58*  GFRAA  --   --   --   --  >60  PROT 7.2  --   --  7.4 8.0  ALBUMIN 4.3  --   --  4.2 4.7  AST 18  --   --  29 31  ALT 31  --   --  49 48  ALKPHOS 60  --   --  50 42  BILITOT 0.3  --   --  0.4 0.5   < > = values in this interval not displayed.    RADIOGRAPHIC STUDIES: I have personally reviewed the radiological images as listed and agreed with the findings in the report. Dg Chest 1 View  Result Date: 11/14/2017 CLINICAL DATA:  Positive TB test. History of malignancy, CHF, and diabetes. EXAM: CHEST  1 VIEW COMPARISON:  CT scan chest of November 07, 2017 and chest x-ray dated August 20, 2016 FINDINGS: The lungs are adequately inflated. There is no focal infiltrate. There are scattered calcified subcentimeter nodules  noted bilaterally. There is pleural thickening and old rib deformity laterally in the upper left hemithorax. The heart and pulmonary vascularity are normal. There is calcification in the wall of the aortic arch. The mediastinum is normal in width. The patient has undergone lower cervical and upper thoracic posterior fusion. There surgical clips in the GE junction. IMPRESSION: Chronic bronchitic changes. No acute pneumonia. Evidence of previous granulomatous infection. Thoracic aortic atherosclerosis. Electronically Signed   By: David  Martinique M.D.   On: 11/14/2017 09:28   Ct Abdomen Pelvis W Contrast  Result Date: 11/22/2017 CLINICAL DATA:  67 year old male with unexplained rash and loss of appetite over the past 2 months with 30 pound weight loss since December 2018. Nausea, nausea, vomiting and diffuse abdominal pain for several weeks. History of diverticulitis. Gastric bypass 2010. Prior appendectomy and hiatal hernia repair. Subsequent encounter. EXAM: CT ABDOMEN AND PELVIS WITH CONTRAST TECHNIQUE: Multidetector CT imaging of the abdomen and pelvis was performed using the standard protocol following bolus administration of intravenous contrast. CONTRAST:  160m ISOVUE-300 IOPAMIDOL (ISOVUE-300) INJECTION 61% COMPARISON:  11/07/2017 chest CT. 01/05/2017 lumbar spine CT. 11/27/2015 CT of the abdomen and pelvis. FINDINGS: Lower chest: Scattered granulomas and mild scarring lung bases. Heart size within normal limits. Hepatobiliary: No worrisome hepatic lesion. Small left lobe liver without change. No calcified gallstone. Prominent size common bile duct measuring up to 11 mm similar to prior exam without calcified common bile duct stone or obvious obstructing mass. Pancreas: No pancreatic mass or inflammation. Spleen: No splenic mass or enlargement. Adrenals/Urinary Tract: No obstructing stone or hydronephrosis. Bilateral renal lesions larger ones which are cysts, others too small to adequately characterize. No  adrenal lesion. Noncontrast filled views of the urinary bladder unremarkable. Stomach/Bowel: Prior gastric bypass with gastric pouch-small bowel anastomosis patent and small bowel traversing superior and anterior to the transverse colon. Distal small bowel anastomosis has a patulous appearance without obstruction. Prior hiatal hernia repair. Circumferential thickening of the distal esophagus once again noted. This may be related to under distension or reflux. Segmented aspect of the stomach and duodenum under distended without abnormality noted. Descending colon and sigmoid colon diverticulosis. Possibility of mild diverticulitis of mid descending colon is raised (series 3, image 31, series 7, image 190 and series 5, image 86). Cecum displaced superiorly and medially. Moderate stool throughout the colon. Vascular/Lymphatic: Atherosclerotic changes aorta without aneurysm. Atherosclerotic changes aortic branch vessels without large vessel occlusion. No adenopathy. Reproductive: Coarse prostate gland calcifications. Other: No free intraperitoneal air or bowel containing hernia. Musculoskeletal: Scoliosis lumbar spine convex left with superimposed degenerative changes. IMPRESSION: Descending colon and sigmoid colon diverticulosis. Possibility of mild diverticulitis of mid  descending colon is raised (series 3, image 31, series 7, image 190 and series 5, image 86). Prior hiatal hernia repair. Circumferential thickening of the distal esophagus (as previously noted) may be related to under distension or reflux. If it were clinically desired to exclude mucosa abnormality, direct visualization would be necessary. Prior gastric bypass procedure without complication noted. Prominent size common bile duct measuring up to 11 mm similar to prior exam without calcified common bile duct stone or obvious obstructing mass. This may be an incidental finding however if there were elevated liver enzymes, this could be further assessed  with MRCP/ERCP. Bilateral renal lesions, larger ones which are cysts, others too small to adequately characterize (although statistically likely cysts). Aortic Atherosclerosis (ICD10-I70.0). Scoliosis lumbar spine convex left with superimposed degenerative changes. These results will be called to the ordering clinician or representative by the Radiologist Assistant, and communication documented in the PACS or zVision Dashboard. Electronically Signed   By: Genia Del M.D.   On: 11/22/2017 08:33    ASSESSMENT & PLAN:   Abnormal weight loss # Weight loss/loss of appetite/early satiety profuse-drenching night sweats/-unclear etiology CT chest and pelvis negative for any malignant pathology.   #Patient has mild leukocytosis/neutrophilia-which seems to be nonspecific however/the context of skin rash see discussion below[] ; and also unexplainable above systemic symptoms-a bone marrow biopsy might be indicated.  Check CBC CMP LDH  # skin rash - uper extr/ LE perivascular dermatitis-left a message for Dr. Nehemiah Massed to discuss the above.  #Given the mild esophageal thickening/recent possible diverticulitis-awaiting GI evaluation with upper and lower endoscopies.  #Easy bruising-again unclear etiology check PT PTT.  # joint ahces/body aches- lowe back- Dr.Stern Nicki Reaper  [Rx- osteoporosis on abnormal Prolia; June 17th re-appt]  #Check CBC CMP LDH; peripheral blood flow cytometry; CRP review of smear  # blood work today; follow up to be decided on above work-up.   All questions were answered. The patient knows to call the clinic with any problems, questions or concerns.  # 40 minutes face-to-face with the patient discussing the above plan of care; more than 50% of time spent on prognosis/ natural history; counseling and coordination.     Cammie Sickle, MD 12/11/2017 9:02 PM

## 2017-12-09 NOTE — Assessment & Plan Note (Addendum)
#  Weight loss/loss of appetite/early satiety profuse-drenching night sweats/-unclear etiology CT chest and pelvis negative for any malignant pathology.   #Patient has mild leukocytosis/neutrophilia-which seems to be nonspecific however/the context of skin rash see discussion below'[]'$ ; and also unexplainable above systemic symptoms-a bone marrow biopsy might be indicated.  Check CBC CMP LDH  # skin rash - uper extr/ LE perivascular dermatitis-left a message for Dr. Nehemiah Massed to discuss the above.  #Given the mild esophageal thickening/recent possible diverticulitis-awaiting GI evaluation with upper and lower endoscopies.  #Easy bruising-again unclear etiology check PT PTT.  # joint ahces/body aches- lowe back- Dr.Stern Nicki Reaper  [Rx- osteoporosis on abnormal Prolia; June 17th re-appt]  #Check CBC CMP LDH; peripheral blood flow cytometry; CRP review of smear  # blood work today; follow up to be decided on above work-up.

## 2017-12-09 NOTE — Telephone Encounter (Signed)
I contacted the patient to obtain a pathology report from dermatology office. Pt states that he has this report and states that he will bring the documents to the apt today. He stated the reports demonstrated perivascular lymphocytic infiltration.

## 2017-12-10 LAB — TESTOSTERONE: TESTOSTERONE: 614 ng/dL (ref 264–916)

## 2017-12-13 ENCOUNTER — Other Ambulatory Visit: Payer: Self-pay | Admitting: Family Medicine

## 2017-12-13 DIAGNOSIS — E119 Type 2 diabetes mellitus without complications: Secondary | ICD-10-CM

## 2017-12-13 LAB — COMP PANEL: LEUKEMIA/LYMPHOMA

## 2017-12-14 ENCOUNTER — Telehealth: Payer: Self-pay | Admitting: Internal Medicine

## 2017-12-14 ENCOUNTER — Telehealth: Payer: Self-pay

## 2017-12-14 ENCOUNTER — Telehealth: Payer: Self-pay | Admitting: *Deleted

## 2017-12-14 DIAGNOSIS — R233 Spontaneous ecchymoses: Secondary | ICD-10-CM

## 2017-12-14 DIAGNOSIS — R634 Abnormal weight loss: Secondary | ICD-10-CM

## 2017-12-14 DIAGNOSIS — R238 Other skin changes: Secondary | ICD-10-CM

## 2017-12-14 DIAGNOSIS — D72829 Elevated white blood cell count, unspecified: Secondary | ICD-10-CM

## 2017-12-14 NOTE — Telephone Encounter (Signed)
Please advise 

## 2017-12-14 NOTE — Telephone Encounter (Signed)
Spoke to patient regarding fairly unimpressive blood work labs-not suggestive of lymphoma leukemia.  Diagnostic yield of a bone marrow biopsy would be low.  Considering the cost/procedural pain-patient wants to hold off for now.  Skin rash-unclear etiology;I discussed with Dr. Nehemiah Massed.  Defer to dermatology for further recommendations/treatments  Recommend follow-up with neurosurgery as planned for chronic back pain.  #Please have the patient follow-up with me in approximately 2 months CBC CMP LDH.

## 2017-12-14 NOTE — Telephone Encounter (Signed)
His lab work was done through oncology. He should call them for the results.

## 2017-12-14 NOTE — Telephone Encounter (Signed)
Dr. Rogue Bussing, patient is requesting lab results.

## 2017-12-14 NOTE — Telephone Encounter (Signed)
Copied from [place] [phone]. Topic: Quick Communication - Lab Results >> Dec 14, 2017  1:47 PM Synthia Innocent wrote: Requesting lab results

## 2017-12-14 NOTE — Addendum Note (Signed)
Addended by: Sabino Gasser on: 12/14/2017 04:26 PM   Modules accepted: Orders

## 2017-12-14 NOTE — Telephone Encounter (Signed)
Dr. B will call patient with results

## 2017-12-14 NOTE — Telephone Encounter (Signed)
Patient called and asked that Nira Conn return his call to discuss pathology results [phone]

## 2017-12-15 NOTE — Telephone Encounter (Signed)
Called and notified patient that per Dr. Caryl Bis that he can try over the counter zyrtec or claritin. Patient verbalized understanding.

## 2017-12-15 NOTE — Telephone Encounter (Signed)
He could try over-the-counter Zyrtec or Claritin to see if that is beneficial.

## 2017-12-15 NOTE — Telephone Encounter (Signed)
Patient would like to know if there is anything he can take for the itching, the nystatin has not been very beneficial for the itching

## 2017-12-15 NOTE — Telephone Encounter (Signed)
Patient states he spoke to oncology and received his results.

## 2017-12-18 ENCOUNTER — Other Ambulatory Visit: Payer: Self-pay | Admitting: Family Medicine

## 2017-12-18 DIAGNOSIS — R600 Localized edema: Secondary | ICD-10-CM

## 2017-12-19 ENCOUNTER — Telehealth: Payer: Self-pay | Admitting: Family Medicine

## 2017-12-19 NOTE — Telephone Encounter (Signed)
Insurance verification for Prolia filed on [employer] Portal. 

## 2017-12-20 ENCOUNTER — Encounter: Payer: Self-pay | Admitting: Urology

## 2017-12-20 ENCOUNTER — Ambulatory Visit: Payer: Medicare Other | Admitting: Urology

## 2017-12-20 VITALS — BP 136/85 | HR 85 | Ht 66.0 in | Wt 188.0 lb

## 2017-12-20 DIAGNOSIS — N420 Calculus of prostate: Secondary | ICD-10-CM | POA: Diagnosis not present

## 2017-12-20 DIAGNOSIS — N138 Other obstructive and reflux uropathy: Secondary | ICD-10-CM | POA: Diagnosis not present

## 2017-12-20 DIAGNOSIS — N281 Cyst of kidney, acquired: Secondary | ICD-10-CM | POA: Diagnosis not present

## 2017-12-20 DIAGNOSIS — Z87442 Personal history of urinary calculi: Secondary | ICD-10-CM | POA: Diagnosis not present

## 2017-12-20 DIAGNOSIS — N401 Enlarged prostate with lower urinary tract symptoms: Secondary | ICD-10-CM | POA: Diagnosis not present

## 2017-12-20 NOTE — Progress Notes (Signed)
12/20/2017 8:28 PM   Justin Patel 09-30-1950 [id number]  Referring provider: [name], MD [address] [place],  [address]  Chief Complaint  Patient presents with  . New Patient (Initial Visit)    prostate calculi    HPI: 67 year old male referred for further evaluation of incidental prostatic calcification on CT scan.  He underwent CT abdomen pelvis with contrast on 11/22/2017 for further evaluation of unexplained weight loss/ rash.  This revealed multiple incidental findings including a prominent common bile duct, thickening of the distal esophagus, diverticulosis and possible mild diverticulitis amongst others.  He also has some incidental bilateral renal cysts.  Remote history of kidney stones which he was able to pass spontanesouly.  No flank pain or gross hematuria.  He does have a personal history of BPH with obstructive urinary symptoms.  He is on Flomax which she has been for several years which helps him void to completion.  IPSS as below.  He is currently pleased with his urinary symptoms.  He does report a remote history of prostatitis as a young man.  No recent episodes of prostatitis.  Most recent PSA 2.05 on 11/02/2017.   IPSS    Row Name 12/20/17 1300         International Prostate Symptom Score   How often have you had the sensation of not emptying your bladder?  Less than 1 in 5     How often have you had to urinate less than every two hours?  Less than 1 in 5 times     How often have you found you stopped and started again several times when you urinated?  Not at All     How often have you found it difficult to postpone urination?  Less than half the time     How often have you had a weak urinary stream?  Less than 1 in 5 times     How often have you had to strain to start urination?  Not at All     How many times did you typically get up at night to urinate?  1 Time     Total IPSS Score  6        Score:  1-7 Mild 8-19  Moderate 20-35 Severe    PMH: Past Medical History:  Diagnosis Date  . 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 Anxiety   . Arthritis   . 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  removal scheduled to clear margins.  . CHF (congestive heart failure) ([place])   . Chronic kidney disease   . Degenerative disc disease   . Diabetes mellitus    no longer diabetic-or on meds  . Gastric ulcer   . GERD (gastroesophageal reflux disease)   . H/O hiatal hernia   . Headache(784.0)   . Heart murmur   . Hx of congestive heart failure   . Hyperlipidemia   . Hypertension   . Iron deficiency anemia 02/28/2015  . Sacral fracture, closed ([place])   . Sleep apnea    hx not now since wt loss  . Stones in the urinary tract   . Syncope and collapse     Surgical History: Past Surgical History:  Procedure Laterality Date  . ANTERIOR CERVICAL DECOMP/DISCECTOMY FUSION  01/26/2012   Procedure: ANTERIOR CERVICAL DECOMPRESSION/DISCECTOMY FUSION 3 LEVELS;  Surgeon: Floyce Stakes, MD;  Location: [facility];  Service:  Neurosurgery;  Laterality: N/A;  Cervical three-four Cervical four-five Cervical five-six Cervical six-seven , Anterior cervical decompression/diskectomy, fusion, plate  . APPENDECTOMY    . [place], normal  . CARDIAC CATHETERIZATION     [place]  . GASTRIC BYPASS  2010   [employer]  . HERNIA REPAIR  2000   hiatal  . JOINT REPLACEMENT    . KNEE ARTHROSCOPY     bilateral, left x 2  . NASAL SINUS SURGERY     x5  . OSTEOTOMY  2001   left  . TONSILLECTOMY    . TOTAL KNEE ARTHROPLASTY Left 05/2014   Dr. Marry Guan  . UVULOPALATOPLASTY  2011    Home Medications:  Allergies as of 12/20/2017      Reactions   Altace [ramipril] Anaphylaxis   Levaquin [levofloxacin In D5w] Hives   Metformin Diarrhea, Other (See Comments)   High doses cause diarrhea. High doses cause diarrhea.   Trulicity  [dulaglutide] Nausea And Vomiting   Conray [iothalamate] Hives   HIVES, he had this reaction to Ionic contrast in 1974 for an IVP. SPM   Lyrica [pregabalin] Other (See Comments)   Edema      Medication List        Accurate as of 12/20/17 11:59 PM. Always use your most recent med list.          acetaminophen 500 MG tablet Commonly known as:  TYLENOL Take 1,000 mg by mouth every 6 (six) hours as needed.   blood glucose meter kit and supplies Kit Dispense based on patient and insurance preference. Check once daily. ICD doing E11.9.   DULoxetine 60 MG capsule Commonly known as:  CYMBALTA take 1 capsule by mouth once daily   esomeprazole 40 MG capsule Commonly known as:  NEXIUM Take 1 capsule (40 mg total) by mouth daily.   furosemide 20 MG tablet Commonly known as:  LASIX TAKE 1 TABLET BY MOUTH ONCE DAILY   HYDROcodone-acetaminophen 7.5-325 MG tablet Commonly known as:  NORCO take 1 tablet by mouth every 6 hours if needed for pain   JARDIANCE 25 MG Tabs tablet Generic drug:  empagliflozin TAKE 1 TABLET BY MOUTH ONCE DAILY   losartan 100 MG tablet Commonly known as:  COZAAR TAKE 1 TABLET BY MOUTH DAILY   metoprolol succinate 50 MG 24 hr tablet Commonly known as:  TOPROL-XL Take 1 tablet (50 mg total) by mouth daily. Take with or immediately following a meal.   nystatin cream Commonly known as:  MYCOSTATIN Apply 1 application topically 2 (two) times daily.   rosuvastatin 20 MG tablet Commonly known as:  CRESTOR Take 1 tablet (20 mg total) by mouth daily.   sucralfate 1 g tablet Commonly known as:  CARAFATE Take 1 tablet (1 g total) by mouth 4 (four) times daily.   tamsulosin 0.4 MG Caps capsule Commonly known as:  FLOMAX TAKE 1 CAPSULE BY MOUTH ONCE DAILY   Vitamin D (Ergocalciferol) 50000 units Caps capsule Commonly known as:  DRISDOL Take 1 capsule (50,000 Units total) by mouth every 7 (seven) days.       Allergies:  Allergies  Allergen Reactions    . Altace [Ramipril] Anaphylaxis  . Levaquin [Levofloxacin In D5w] Hives  . Metformin Diarrhea and Other (See Comments)    High doses cause diarrhea. High doses cause diarrhea.  Marland Kitchen Trulicity [Dulaglutide] Nausea And Vomiting  . Conray [Iothalamate] Hives    HIVES, he had this reaction to Ionic contrast  in 1974 for an IVP. SPM   . Lyrica [Pregabalin] Other (See Comments)    Edema    Family History: Family History  Problem Relation Age of Onset  . Dementia Mother 34  . Heart disease Father 29  . Diabetes Father   . Lymphoma Sister        lymphoma, stage 4  . Ovarian cancer Sister 79       Ovarian  . Lupus Sister   . Prostate cancer Maternal Uncle   . Skin cancer Maternal Uncle   . Tongue cancer Maternal Uncle        uncle died of heart attack  . Alcoholism Maternal Uncle   . Lung cancer Paternal Aunt        pat aunts x 4 died of lung cancer  . Lung cancer Paternal Aunt   . Lung cancer Paternal Aunt   . Lung cancer Paternal Aunt   . Mesothelioma Cousin        paternal cousin    Social History:  reports that he has never smoked. He has never used smokeless tobacco. He reports that he does not drink alcohol or use drugs.  ROS: UROLOGY Frequent Urination?: Yes Hard to postpone urination?: No Burning/pain with urination?: No Get up at night to urinate?: Yes Leakage of urine?: No Urine stream starts and stops?: Yes Trouble starting stream?: No Do you have to strain to urinate?: No Blood in urine?: No Urinary tract infection?: No Sexually transmitted disease?: No Injury to kidneys or bladder?: No Painful intercourse?: No Weak stream?: Yes Erection problems?: No Penile pain?: No  Gastrointestinal Nausea?: Yes Vomiting?: Yes Indigestion/heartburn?: No Diarrhea?: Yes Constipation?: No  Constitutional Fever: No Night sweats?: No Weight loss?: No Fatigue?: No  Skin Skin rash/lesions?: Yes Itching?: Yes  Eyes Blurred vision?: No Double vision?:  No  Ears/Nose/Throat Sore throat?: No Sinus problems?: Yes  Hematologic/Lymphatic Swollen glands?: Yes Easy bruising?: Yes  Cardiovascular Leg swelling?: No Chest pain?: No  Respiratory Cough?: No Shortness of breath?: No  Endocrine Excessive thirst?: Yes  Musculoskeletal Back pain?: Yes Joint pain?: Yes  Neurological Headaches?: No Dizziness?: No  Psychologic Depression?: No Anxiety?: No  Physical Exam: BP 136/85 (BP Location: Left Arm, Patient Position: Sitting, Cuff Size: Normal)   Pulse 85   Ht _0  (1.676 m)   Wt 188 lb (85.3 kg)   BMI 30.34 kg/m   Constitutional:  Alert and oriented, No acute distress. HEENT: [place] AT, moist mucus membranes.  Trachea midline, no masses. Cardiovascular: No clubbing, cyanosis, or edema. Respiratory: Normal respiratory effort, no increased work of breathing. GI: Abdomen is soft, nontender, nondistended, no abdominal masses GU: No CVA tenderness Rectal: Normal sphincter tone, 30 cc prostate, nontender, nondistended Skin: No rashes, bruises or suspicious lesions. Neurologic: Grossly intact, no focal deficits, moving all 4 extremities. Psychiatric: Normal mood and affect.  Laboratory Data: Lab Results  Component Value Date   WBC 11.0 (H) 12/09/2017   HGB 17.0 12/09/2017   HCT 50.9 12/09/2017   MCV 100.2 (H) 12/09/2017   PLT 242 12/09/2017    Lab Results  Component Value Date   CREATININE 1.26 (H) 12/09/2017    Lab Results  Component Value Date   PSA 2.05 11/02/2017   PSA 0.64 11/08/2013    Lab Results  Component Value Date   TESTOSTERONE 614 12/09/2017    Lab Results  Component Value Date   HGBA1C 6.8 (H) 10/19/2017    Urinalysis N/A  Pertinent Imaging: CT abdomen pelvis  with contrast on 11/22/2016 personally reviewed today with the patient.  CLINICAL DATA:  67 year old male with unexplained rash and loss of appetite over the past 2 months with 30 pound weight loss since December 2018. Nausea,  nausea, vomiting and diffuse abdominal pain for several weeks. History of diverticulitis. Gastric bypass 2010. Prior appendectomy and hiatal hernia repair. Subsequent encounter.  EXAM: CT ABDOMEN AND PELVIS WITH CONTRAST  TECHNIQUE: Multidetector CT imaging of the abdomen and pelvis was performed using the standard protocol following bolus administration of intravenous contrast.  CONTRAST:  154m ISOVUE-300 IOPAMIDOL (ISOVUE-300) INJECTION 61%  COMPARISON:  11/07/2017 chest CT. 01/05/2017 lumbar spine CT. 11/27/2015 CT of the abdomen and pelvis.  FINDINGS: Lower chest: Scattered granulomas and mild scarring lung bases. Heart size within normal limits.  Hepatobiliary: No worrisome hepatic lesion. Small left lobe liver without change.  No calcified gallstone.  Prominent size common bile duct measuring up to 11 mm similar to prior exam without calcified common bile duct stone or obvious obstructing mass.  Pancreas: No pancreatic mass or inflammation.  Spleen: No splenic mass or enlargement.  Adrenals/Urinary Tract: No obstructing stone or hydronephrosis. Bilateral renal lesions larger ones which are cysts, others too small to adequately characterize.  No adrenal lesion.  Noncontrast filled views of the urinary bladder unremarkable.  Stomach/Bowel: Prior gastric bypass with gastric pouch-small bowel anastomosis patent and small bowel traversing superior and anterior to the transverse colon. Distal small bowel anastomosis has a patulous appearance without obstruction.  Prior hiatal hernia repair. Circumferential thickening of the distal esophagus once again noted. This may be related to under distension or reflux.  Segmented aspect of the stomach and duodenum under distended without abnormality noted.  Descending colon and sigmoid colon diverticulosis. Possibility of mild diverticulitis of mid descending colon is raised (series 3, image 31, series  7, image 190 and series 5, image 86).  Cecum displaced superiorly and medially.  Moderate stool throughout the colon.  Vascular/Lymphatic: Atherosclerotic changes aorta without aneurysm. Atherosclerotic changes aortic branch vessels without large vessel occlusion.  No adenopathy.  Reproductive: Coarse prostate gland calcifications.  Other: No free intraperitoneal air or bowel containing hernia.  Musculoskeletal: Scoliosis lumbar spine convex left with superimposed degenerative changes.  IMPRESSION: Descending colon and sigmoid colon diverticulosis. Possibility of mild diverticulitis of mid descending colon is raised (series 3, image 31, series 7, image 190 and series 5, image 86).  Prior hiatal hernia repair. Circumferential thickening of the distal esophagus (as previously noted) may be related to under distension or reflux. If it were clinically desired to exclude mucosa abnormality, direct visualization would be necessary.  Prior gastric bypass procedure without complication noted.  Prominent size common bile duct measuring up to 11 mm similar to prior exam without calcified common bile duct stone or obvious obstructing mass. This may be an incidental finding however if there were elevated liver enzymes, this could be further assessed with MRCP/ERCP.  Bilateral renal lesions, larger ones which are cysts, others too small to adequately characterize (although statistically likely cysts).  Aortic Atherosclerosis (ICD10-I70.0).  Scoliosis lumbar spine convex left with superimposed degenerative changes.  These results will be called to the ordering clinician or representative by the Radiologist Assistant, and communication documented in the PACS or zVision Dashboard.   Electronically Signed   By: SGenia DelM.D.   On: 11/22/2017 08:33  Assessment & Plan:    1. Benign prostatic hyperplasia with urinary obstruction Pleased with urinary  symptoms on Flomax We discussed alternatives to chronic  medical therapy including outlet surgery, patient not interested in surgery at this time as an option Will return if he is ever interested in pursuing this  2. Personal history of kidney stones No stones appreciated on contrast imaging Reviewed stone diet  3. Calculi, prostate Nonspecific calcifications within the prostate without any clinical correlate PSA and rectal exam unremarkable No further evaluation needed  4. Renal cyst Benign-appearing renal cyst bilaterally, no indication for further evaluation  Fu as needed  Hollice Espy, MD  [facility] [address], [place], [place] [address] [phone]

## 2017-12-26 ENCOUNTER — Encounter: Payer: Self-pay | Admitting: *Deleted

## 2017-12-27 ENCOUNTER — Encounter
Admission: RE | Disposition: A | Discharge status: Home / Self Care | Payer: Self-pay | Source: Ambulatory Visit | Attending: Internal Medicine

## 2017-12-27 ENCOUNTER — Other Ambulatory Visit: Payer: Self-pay

## 2017-12-27 ENCOUNTER — Ambulatory Visit: Payer: Medicare Other | Admitting: Certified Registered"

## 2017-12-27 ENCOUNTER — Ambulatory Visit
Admission: RE | Admit: 2017-12-27 | Discharge: 2017-12-27 | Disposition: A | Discharge status: Home / Self Care | Payer: Medicare Other | Source: Ambulatory Visit | Attending: Internal Medicine | Admitting: Internal Medicine

## 2017-12-27 DIAGNOSIS — R194 Change in bowel habit: Secondary | ICD-10-CM | POA: Diagnosis not present

## 2017-12-27 DIAGNOSIS — N189 Chronic kidney disease, unspecified: Secondary | ICD-10-CM | POA: Diagnosis not present

## 2017-12-27 DIAGNOSIS — Z9884 Bariatric surgery status: Secondary | ICD-10-CM | POA: Insufficient documentation

## 2017-12-27 DIAGNOSIS — Z86010 Personal history of colonic polyps: Secondary | ICD-10-CM | POA: Diagnosis not present

## 2017-12-27 DIAGNOSIS — I130 Hypertensive heart and chronic kidney disease with heart failure and stage 1 through stage 4 chronic kidney disease, or unspecified chronic kidney disease: Secondary | ICD-10-CM | POA: Insufficient documentation

## 2017-12-27 DIAGNOSIS — Z888 Allergy status to other drugs, medicaments and biological substances status: Secondary | ICD-10-CM | POA: Diagnosis not present

## 2017-12-27 DIAGNOSIS — R634 Abnormal weight loss: Secondary | ICD-10-CM | POA: Insufficient documentation

## 2017-12-27 DIAGNOSIS — K219 Gastro-esophageal reflux disease without esophagitis: Secondary | ICD-10-CM | POA: Diagnosis not present

## 2017-12-27 DIAGNOSIS — M1990 Unspecified osteoarthritis, unspecified site: Secondary | ICD-10-CM | POA: Insufficient documentation

## 2017-12-27 DIAGNOSIS — R1032 Left lower quadrant pain: Secondary | ICD-10-CM | POA: Insufficient documentation

## 2017-12-27 DIAGNOSIS — K640 First degree hemorrhoids: Secondary | ICD-10-CM | POA: Diagnosis not present

## 2017-12-27 DIAGNOSIS — K5730 Diverticulosis of large intestine without perforation or abscess without bleeding: Secondary | ICD-10-CM | POA: Diagnosis not present

## 2017-12-27 DIAGNOSIS — Z980 Intestinal bypass and anastomosis status: Secondary | ICD-10-CM | POA: Diagnosis not present

## 2017-12-27 DIAGNOSIS — I509 Heart failure, unspecified: Secondary | ICD-10-CM | POA: Diagnosis not present

## 2017-12-27 DIAGNOSIS — K2950 Unspecified chronic gastritis without bleeding: Secondary | ICD-10-CM | POA: Diagnosis not present

## 2017-12-27 DIAGNOSIS — Z85828 Personal history of other malignant neoplasm of skin: Secondary | ICD-10-CM | POA: Insufficient documentation

## 2017-12-27 DIAGNOSIS — Z881 Allergy status to other antibiotic agents status: Secondary | ICD-10-CM | POA: Diagnosis not present

## 2017-12-27 DIAGNOSIS — G4730 Sleep apnea, unspecified: Secondary | ICD-10-CM | POA: Diagnosis not present

## 2017-12-27 DIAGNOSIS — Z8711 Personal history of peptic ulcer disease: Secondary | ICD-10-CM | POA: Diagnosis not present

## 2017-12-27 DIAGNOSIS — E785 Hyperlipidemia, unspecified: Secondary | ICD-10-CM | POA: Diagnosis not present

## 2017-12-27 DIAGNOSIS — F419 Anxiety disorder, unspecified: Secondary | ICD-10-CM | POA: Diagnosis not present

## 2017-12-27 HISTORY — DX: Family history of other specified conditions: Z84.89

## 2017-12-27 HISTORY — PX: COLONOSCOPY WITH PROPOFOL: SHX5780

## 2017-12-27 HISTORY — DX: Unspecified convulsions: R56.9

## 2017-12-27 HISTORY — PX: ESOPHAGOGASTRODUODENOSCOPY (EGD) WITH PROPOFOL: SHX5813

## 2017-12-27 LAB — GLUCOSE, CAPILLARY: Glucose-Capillary: 127 mg/dL — ABNORMAL HIGH (ref 65–99)

## 2017-12-27 SURGERY — COLONOSCOPY WITH PROPOFOL
Anesthesia: General

## 2017-12-27 MED ORDER — PROPOFOL 10 MG/ML IV BOLUS
INTRAVENOUS | Status: DC | PRN
  Administered 2017-12-27 (×2): 80 mg via INTRAVENOUS
  Administered 2017-12-27: 20 mg via INTRAVENOUS

## 2017-12-27 MED ORDER — LIDOCAINE HCL (CARDIAC) PF 100 MG/5ML IV SOSY
PREFILLED_SYRINGE | INTRAVENOUS | Status: DC | PRN
  Administered 2017-12-27: 80 mg via INTRAVENOUS

## 2017-12-27 MED ORDER — FENTANYL CITRATE (PF) 100 MCG/2ML IJ SOLN
INTRAMUSCULAR | Status: DC | PRN
  Administered 2017-12-27: 25 ug via INTRAVENOUS
  Administered 2017-12-27: 50 ug via INTRAVENOUS
  Administered 2017-12-27: 25 ug via INTRAVENOUS

## 2017-12-27 MED ORDER — SODIUM CHLORIDE 0.9 % IV SOLN
INTRAVENOUS | Status: DC
  Administered 2017-12-27: 08:00:00 via INTRAVENOUS

## 2017-12-27 MED ORDER — PROPOFOL 10 MG/ML IV BOLUS
INTRAVENOUS | Status: AC
  Filled 2017-12-27: qty 20

## 2017-12-27 MED ORDER — PHENYLEPHRINE HCL 10 MG/ML IJ SOLN
INTRAMUSCULAR | Status: DC | PRN
  Administered 2017-12-27: 200 ug via INTRAVENOUS

## 2017-12-27 MED ORDER — FENTANYL CITRATE (PF) 100 MCG/2ML IJ SOLN
INTRAMUSCULAR | Status: AC
  Filled 2017-12-27: qty 2

## 2017-12-27 MED ORDER — PROPOFOL 10 MG/ML IV BOLUS
INTRAVENOUS | Status: AC
  Filled 2017-12-27: qty 40

## 2017-12-27 MED ORDER — PROPOFOL 500 MG/50ML IV EMUL
INTRAVENOUS | Status: DC | PRN
  Administered 2017-12-27: 125 ug/kg/min via INTRAVENOUS

## 2017-12-27 NOTE — Op Note (Addendum)
[facility] Gastroenterology Patient Name: Justin Patel Procedure Date: 12/27/2017 8:10 AM MRN: [id number] Account #: [id number] Date of Birth: 10-19-1950 Admit Type: Outpatient Age: 67 Room: [facility] ENDO ROOM 1 Gender: Male Note Status: Supervisor Override Instrument Name: Jasper Riling [id number] Procedure:             Colonoscopy Indications:           Abdominal pain in the left lower quadrant, Functional                         diarrhea, Weight loss, Incidental change in bowel                         habits noted Providers:             Benay Pike. Alice Reichert MD, MD Referring MD:          Angela Adam. Caryl Bis (Referring MD) Medicines:             Propofol per Anesthesia Complications:         No immediate complications. Procedure:             Pre-Anesthesia Assessment:                        - The risks and benefits of the procedure and the                         sedation options and risks were discussed with the                         patient. All questions were answered and informed                         consent was obtained.                        - Patient identification and proposed procedure were                         verified prior to the procedure by the nurse. The                         procedure was verified in the procedure room.                        - ASA Grade Assessment: II - A patient with mild                         systemic disease.                        - After reviewing the risks and benefits, the patient                         was deemed in satisfactory condition to undergo the                         procedure.                        After obtaining  informed consent, the colonoscope was                         passed under direct vision. Throughout the procedure,                         the patient's blood pressure, pulse, and oxygen                         saturations were monitored continuously. The                         Colonoscope was  introduced through the anus and                         advanced to the the cecum, identified by appendiceal                         orifice and ileocecal valve. The colonoscopy was                         performed without difficulty. The patient tolerated                         the procedure well. The quality of the bowel                         preparation was adequate to identify polyps 6 mm and                         larger in size. The ileocecal valve, appendiceal                         orifice, and rectum were photographed. Findings:      The perianal and digital rectal examinations were normal. Pertinent       negatives include normal sphincter tone and no palpable rectal lesions.      A few small-mouthed diverticula were found in the sigmoid colon.      The colon (entire examined portion) appeared normal. Biopsies for       histology were taken with a cold forceps from the entire colon for       evaluation of microscopic colitis.      Non-bleeding internal hemorrhoids were found during retroflexion. The       hemorrhoids were Grade I (internal hemorrhoids that do not prolapse).      The exam was otherwise without abnormality. Impression:            - Diverticulosis in the sigmoid colon.                        - The entire examined colon is normal. Biopsied.                        - Non-bleeding internal hemorrhoids.                        - The examination was otherwise normal. Recommendation:        - Patient has a contact number available for  emergencies. The signs and symptoms of potential                         delayed complications were discussed with the patient.                         Return to normal activities tomorrow. Written                         discharge instructions were provided to the patient.                        - Resume previous diet.                        - Continue present medications.                        - Await  pathology results.                        - Repeat colonoscopy in 10 years for screening                         purposes.                        - Return to physician assistant in 3 months.                        - The findings and recommendations were discussed with                         the patient and their spouse. Procedure Code(s):     --- Professional ---                        [phone], Colonoscopy, flexible; with biopsy, single or                         multiple Diagnosis Code(s):     --- Professional ---                        K57.30, Diverticulosis of large intestine without                         perforation or abscess without bleeding                        R63.4, Abnormal weight loss                        K59.1, Functional diarrhea                        R10.32, Left lower quadrant pain                        K64.0, First degree hemorrhoids CPT copyright 2019 American Medical Association. All rights reserved. The codes documented in this report are preliminary and upon coder review may  be revised to meet current compliance requirements. [place]  MD, MD 12/27/2017 8:49:17 AM This report has been signed electronically. Number of Addenda: 0 Note Initiated On: 12/27/2017 8:10 AM Scope Withdrawal Time: 0 hours 6 minutes 9 seconds  Total Procedure Duration: 0 hours 11 minutes 47 seconds       [facility]

## 2017-12-27 NOTE — Anesthesia Preprocedure Evaluation (Signed)
Anesthesia Evaluation  Patient identified by MRN, date of birth, ID band Patient awake    Reviewed: Allergy & Precautions, H&P , NPO status , Patient's Chart, lab work & pertinent test results, reviewed documented beta blocker date and time   History of Anesthesia Complications Negative for: history of anesthetic complications  Airway Mallampati: I  TM Distance: >3 FB Neck ROM: full    Dental  (+) Dental Advidsory Given, Caps, Missing   Pulmonary neg pulmonary ROS,           Cardiovascular Exercise Tolerance: Good hypertension, (-) angina+CHF (history, but none since weight loss)  (-) CAD, (-) Past MI, (-) Cardiac Stents and (-) CABG negative cardio ROS  (-) dysrhythmias + Valvular Problems/Murmurs      Neuro/Psych Seizures -, Well Controlled,  PSYCHIATRIC DISORDERS Anxiety    GI/Hepatic Neg liver ROS, hiatal hernia, PUD, GERD  ,  Endo/Other  diabetes (borderline)  Renal/GU Renal disease  negative genitourinary   Musculoskeletal   Abdominal   Peds  Hematology negative hematology ROS (+)   Anesthesia Other Findings Past Medical History: No date: Anemia     Comment:  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                removal scheduled to clear margins. No date: CHF (congestive heart failure) ([facility])     Comment:  no longer after weight loss No date: Chronic kidney disease     Comment:  cysts No date: Degenerative disc disease No date: Diabetes mellitus     Comment:  no longer diabetic-or on meds No date: Family history of adverse reaction to anesthesia     Comment:  sons wake up combative No date: Gastric ulcer No date: GERD (gastroesophageal reflux disease) No date: H/O hiatal hernia No date: Headache(784.0)     Comment:  sinus No date: Heart murmur No date: Hx of congestive  heart failure No date: Hyperlipidemia No date: Hypertension 02/28/2015: Iron deficiency anemia No date: Sacral fracture, closed ([facility]) No date: Seizures ([place])     Comment:  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, after GI bleed No date: Sleep apnea     Comment:  hx not now since wt loss No date: Stones in the urinary tract No date: Syncope and collapse   Reproductive/Obstetrics negative OB ROS                             Anesthesia Physical Anesthesia Plan  ASA: II  Anesthesia Plan: General   Post-op Pain Management:    Induction: Intravenous  PONV Risk Score and Plan: 2 and Propofol infusion  Airway Management Planned: Nasal Cannula and Natural Airway  Additional Equipment:   Intra-op Plan:   Post-operative Plan:   Informed Consent: I have reviewed the patients History and Physical, chart, labs and discussed the procedure including the risks, benefits and alternatives for the proposed anesthesia with the patient or authorized representative who has indicated his/her understanding and acceptance.   Dental Advisory Given  Plan Discussed with: Anesthesiologist, CRNA and Surgeon  Anesthesia Plan Comments:         Anesthesia Quick Evaluation

## 2017-12-27 NOTE — Interval H&P Note (Signed)
History and Physical Interval Note:  12/27/2017 8:12 AM  Justin Patel  has presented today for surgery, with the diagnosis of HX COLON POLYPS GERD WT LOSS NAUSEA  The various methods of treatment have been discussed with the patient and family. After consideration of risks, benefits and other options for treatment, the patient has consented to  Procedure(s): COLONOSCOPY WITH PROPOFOL (N/A) ESOPHAGOGASTRODUODENOSCOPY (EGD) WITH PROPOFOL (N/A) as a surgical intervention .  The patient's history has been reviewed, patient examined, no change in status, stable for surgery.  I have reviewed the patient's chart and labs.  Questions were answered to the patient's satisfaction.     [place], [place]

## 2017-12-27 NOTE — Op Note (Signed)
[facility] Gastroenterology Patient Name: Justin Patel Procedure Date: 12/27/2017 8:11 AM MRN: [id number] Account #: [id number] Date of Birth: January 10, 1951 Admit Type: Outpatient Age: 67 Room: [facility] ENDO ROOM 1 Gender: Male Note Status: Finalized Procedure:            Upper GI endoscopy Indications:          Abdominal pain in the left lower quadrant, Esophageal                        reflux, Weight loss Providers:            Benay Pike. Alice Reichert MD, MD Referring MD:         Angela Adam. Caryl Bis (Referring MD) Medicines:            Propofol per Anesthesia Complications:        No immediate complications. Procedure:            Pre-Anesthesia Assessment:                       - The risks and benefits of the procedure and the                        sedation options and risks were discussed with the                        patient. All questions were answered and informed                        consent was obtained.                       - Patient identification and proposed procedure were                        verified prior to the procedure by the nurse. The                        procedure was verified in the procedure room.                       - The risks and benefits of the procedure and the                        sedation options and risks were discussed with the                        patient. All questions were answered and informed                        consent was obtained.                       - Patient identification and proposed procedure were                        verified prior to the procedure by the nurse. The                        procedure was verified in the procedure room.                       -  ASA Grade Assessment: II - A patient with mild                        systemic disease.                       - After reviewing the risks and benefits, the patient                        was deemed in satisfactory condition to undergo the          procedure.                       After obtaining informed consent, the endoscope was                        passed under direct vision. Throughout the procedure,                        the patient's blood pressure, pulse, and oxygen                        saturations were monitored continuously. The Endoscope                        was introduced through the mouth, and advanced to the                        afferent and efferent jejunal loops. The upper GI                        endoscopy was accomplished without difficulty. The                        patient tolerated the procedure well. Findings:      The examined esophagus was normal.      Evidence of a gastric bypass was found. A gastric pouch with a medium       size was found containing staples. The staple line appeared intact. The       gastrojejunal anastomosis was characterized by healthy appearing mucosa.       This was traversed. The pouch-to-jejunum limb was characterized by       healthy appearing mucosa. The jejunojejunal anastomosis was       characterized by healthy appearing mucosa. The duodenum-to-jejunum limb       was not examined as it could not be found. Biopsies were taken with a       cold forceps for histology.      The examined jejunum was normal. Biopsies for histology were taken with       a cold forceps for evaluation of celiac disease. Impression:           - Normal esophagus.                       - Gastric bypass with a medium-sized pouch and intact                        staple line. Gastrojejunal anastomosis characterized by  healthy appearing mucosa. Biopsied.                       - Normal examined jejunum. Biopsied. Recommendation:       - Await pathology results.                       - Proceed with colonoscopy Procedure Code(s):    --- Professional ---                       [phone], Esophagogastroduodenoscopy, flexible, transoral;                        with biopsy,  single or multiple Diagnosis Code(s):    --- Professional ---                       Z98.84, Bariatric surgery status                       R10.32, Left lower quadrant pain                       K21.9, Gastro-esophageal reflux disease without                        esophagitis                       R63.4, Abnormal weight loss CPT copyright 2017 American Medical Association. All rights reserved. The codes documented in this report are preliminary and upon coder review may  be revised to meet current compliance requirements. Efrain Sella MD, MD 12/27/2017 8:30:51 AM This report has been signed electronically. Number of Addenda: 0 Note Initiated On: 12/27/2017 8:11 AM      [facility]

## 2017-12-27 NOTE — Transfer of Care (Signed)
Immediate Anesthesia Transfer of Care Note  Patient: Justin Patel  Procedure(s) Performed: COLONOSCOPY WITH PROPOFOL (N/A ) ESOPHAGOGASTRODUODENOSCOPY (EGD) WITH PROPOFOL (N/A )  Patient Location: PACU  Anesthesia Type:General  Level of Consciousness: sedated  Airway & Oxygen Therapy: Patient Spontanous Breathing and Patient connected to nasal cannula oxygen  Post-op Assessment: Report given to RN and Post -op Vital signs reviewed and stable  Post vital signs: Reviewed and stable  Last Vitals:  Vitals Value Taken Time  BP    Temp    Pulse    Resp    SpO2      Last Pain:  Vitals:   12/27/17 0740  TempSrc: Tympanic  PainSc: 0-No pain         Complications: No apparent anesthesia complications

## 2017-12-27 NOTE — Anesthesia Postprocedure Evaluation (Signed)
Anesthesia Post Note  Patient: ARNEL WYMER  Procedure(s) Performed: COLONOSCOPY WITH PROPOFOL (N/A ) ESOPHAGOGASTRODUODENOSCOPY (EGD) WITH PROPOFOL (N/A )  Patient location during evaluation: Endoscopy Anesthesia Type: General Level of consciousness: awake and alert Pain management: pain level controlled Vital Signs Assessment: post-procedure vital signs reviewed and stable Respiratory status: spontaneous breathing, nonlabored ventilation, respiratory function stable and patient connected to nasal cannula oxygen Cardiovascular status: blood pressure returned to baseline and stable Postop Assessment: no apparent nausea or vomiting Anesthetic complications: no     Last Vitals:  Vitals:   12/27/17 0859 12/27/17 0909  BP: 110/72 119/80  Pulse: 76 74  Resp: 17 18  Temp:    SpO2: 98% 99%    Last Pain:  Vitals:   12/27/17 0909  TempSrc:   PainSc: 7                  Martha Clan

## 2017-12-27 NOTE — Anesthesia Post-op Follow-up Note (Signed)
Anesthesia QCDR form completed.        

## 2017-12-27 NOTE — H&P (Signed)
Outpatient short stay form Pre-procedure 12/27/2017 8:08 AM Teodoro K. Alice Reichert, M.D.  Primary Physician: [name], M.D.  Reason for visit: Weight loss, GERD, Personal hx of colon polyps.  History of present illness: patient's a 67 year old male with a history of weight loss, intermittent diarrhea and the left lower quadrant abdominal pain for the past 5 months. He has lost about 40 pounds unintentionally although he did undergo a gastric bypass surgery in June 2010. He also has a personal history of colon polyps.    Current Facility-Administered Medications:  .  0.9 %  sodium chloride infusion, , Intravenous, Continuous, [place], Benay Pike, MD, Last Rate: 20 mL/hr at 12/27/17 0745  Medications Prior to Admission  Medication Sig Dispense Refill Last Dose  . acetaminophen (TYLENOL) 500 MG tablet Take 1,000 mg by mouth every 6 (six) hours as needed.   Taking  . denosumab (PROLIA) 60 MG/ML SOSY injection Inject 60 mg into the skin every 6 (six) months.     . DULoxetine (CYMBALTA) 60 MG capsule take 1 capsule by mouth once daily 30 capsule 5 12/27/2017 at Unknown time  . esomeprazole (NEXIUM) 40 MG capsule Take 1 capsule (40 mg total) by mouth daily. 30 capsule 3 12/27/2017 at Unknown time  . furosemide (LASIX) 20 MG tablet TAKE 1 TABLET BY MOUTH ONCE DAILY 90 tablet 0 12/27/2017 at Unknown time  . HYDROcodone-acetaminophen (NORCO) 7.5-325 MG tablet take 1 tablet by mouth every 6 hours if needed for pain  0 Past Month at Unknown time  . JARDIANCE 25 MG TABS tablet TAKE 1 TABLET BY MOUTH ONCE DAILY 30 tablet 0 12/27/2017 at Unknown time  . losartan (COZAAR) 100 MG tablet TAKE 1 TABLET BY MOUTH DAILY 90 tablet 0 12/27/2017 at Unknown time  . metoprolol succinate (TOPROL-XL) 50 MG 24 hr tablet Take 1 tablet (50 mg total) by mouth daily. Take with or immediately following a meal. 30 tablet 6 12/27/2017 at Unknown time  . nystatin cream (MYCOSTATIN) Apply 1 application topically 2 (two) times daily. 30  g 0 Taking  . rosuvastatin (CRESTOR) 20 MG tablet Take 1 tablet (20 mg total) by mouth daily. 30 tablet 6 12/27/2017 at Unknown time  . sucralfate (CARAFATE) 1 g tablet Take 1 tablet (1 g total) by mouth 4 (four) times daily. 120 tablet 11 12/26/2017 at Unknown time  . blood glucose meter kit and supplies KIT Dispense based on patient and insurance preference. Check once daily. ICD doing E11.9. 1 each 0 Taking  . tamsulosin (FLOMAX) 0.4 MG CAPS capsule TAKE 1 CAPSULE BY MOUTH ONCE DAILY (Patient not taking: Reported on 12/27/2017) 90 capsule 0 Not Taking at Unknown time  . Vitamin D, Ergocalciferol, (DRISDOL) 50000 units CAPS capsule Take 1 capsule (50,000 Units total) by mouth every 7 (seven) days. 8 capsule 0 Taking     Allergies  Allergen Reactions  . Altace [Ramipril] Anaphylaxis  . Levaquin [Levofloxacin In D5w] Hives  . Metformin Diarrhea and Other (See Comments)    High doses cause diarrhea. High doses cause diarrhea.  Marland Kitchen Trulicity [Dulaglutide] Nausea And Vomiting  . 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 Lyrica [Pregabalin] Other (See Comments)    Edema     Past Medical History:  Diagnosis Date  . 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 Anxiety   . Arthritis   . 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  removal scheduled to clear margins.  . CHF (congestive heart failure) ([place])    no longer after weight loss  . Chronic kidney disease    cysts  . Degenerative disc disease   . Diabetes mellitus    no longer diabetic-or on meds  . Family history of adverse reaction to anesthesia    sons wake up combative  . Gastric ulcer   . GERD (gastroesophageal reflux disease)   . H/O hiatal hernia   . Headache(784.0)    sinus  . Heart murmur   . Hx of congestive heart failure   . Hyperlipidemia   . Hypertension   . Iron deficiency anemia 02/28/2015  . Sacral  fracture, closed ([place])   . Seizures ([place])    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, after GI bleed  . Sleep apnea    hx not now since wt loss  . Stones in the urinary tract   . Syncope and collapse     Review of systems:   Otherwise negative.   Physical Exam  Gen: Alert, oriented. Appears stated age.  HEENT: [place]/AT. PERRLA. Lungs: CTA, no wheezes. CV: RR nl S1, S2. Abd: soft, benign, no masses. BS+ Ext: No edema. Pulses 2+    Planned procedures: proceed with EGD and colonoscopy. The patient understands the nature of the planned procedure, indications, risks, alternatives and potential complications including but not limited to bleeding, infection, perforation, damage to internal organs and possible oversedation/side effects from anesthesia. The patient agrees and gives consent to proceed.  Please refer to procedure notes for findings, recommendations and patient disposition/instructions.    Teodoro K. Alice Reichert, M.D. Gastroenterology 12/27/2017  8:08 AM

## 2017-12-28 LAB — SURGICAL PATHOLOGY

## 2017-12-29 ENCOUNTER — Encounter: Payer: Self-pay | Admitting: Internal Medicine

## 2018-01-09 ENCOUNTER — Other Ambulatory Visit: Payer: Self-pay | Admitting: Family Medicine

## 2018-01-09 DIAGNOSIS — E119 Type 2 diabetes mellitus without complications: Secondary | ICD-10-CM

## 2018-01-10 ENCOUNTER — Other Ambulatory Visit: Payer: Self-pay | Admitting: Family Medicine

## 2018-01-10 NOTE — Telephone Encounter (Signed)
Last OV 12/07/17 last filled 02/01/17 120 11rf

## 2018-01-11 ENCOUNTER — Telehealth: Payer: Self-pay | Admitting: Family Medicine

## 2018-01-11 NOTE — Telephone Encounter (Signed)
Copied from [place] [id number]. Topic: Quick Communication - See Telephone Encounter >> Jan 11, 2018  1:08 PM Ivar Drape wrote: CRM for notification. See Telephone encounter for: 01/11/18. The patient was prescribed Risampin 300mg  by the health department for his TB, but the patient stated he is going to stop taking the medication as of today because he is experiencing burning when urinating, dark urine, cramping in stomach, and his penis is irritated and raw. The patient also wanted the provider to know that his skin issues are not getting any better. Please advise as to future directions.  Also, the urologist took him off of the Flomax because she felt he didn't need it because his prostate is fine. Also the patient's weight is continually dropping with a loss of appetite.  He now weighs 174 pounds.

## 2018-01-11 NOTE — Telephone Encounter (Signed)
He needs to let the physician at the health department know about the side effects.  He should additionally be evaluated if they have been continuing to occur.  At this point he needs to follow-up with Dr. Rogue Bussing to consider bone marrow biopsy.  All of his other work-up has not led to a cause of his weight loss.

## 2018-01-11 NOTE — Telephone Encounter (Signed)
Please advise 

## 2018-01-12 NOTE — Telephone Encounter (Signed)
Left detailed voicemail for patient with Dr. Tharon Aquas recommendations and for the patient to call the office if he has any further questions or concerns.

## 2018-01-13 ENCOUNTER — Telehealth: Payer: Self-pay | Admitting: *Deleted

## 2018-01-13 ENCOUNTER — Telehealth: Payer: Self-pay

## 2018-01-13 ENCOUNTER — Other Ambulatory Visit: Payer: Self-pay | Admitting: Oncology

## 2018-01-13 DIAGNOSIS — R634 Abnormal weight loss: Secondary | ICD-10-CM

## 2018-01-13 DIAGNOSIS — R61 Generalized hyperhidrosis: Secondary | ICD-10-CM

## 2018-01-13 NOTE — Telephone Encounter (Signed)
Patient called and states that his PCP wants him to go ahead and schedule BM biopsy as his condition has accelerated. He states Dr Rogue Bussing had said this was the next option. Please advise

## 2018-01-13 NOTE — Telephone Encounter (Signed)
Copied from [place] [id number]. Topic: General - Other >> Jan 13, 2018  4:33 PM Mcneil, Ja-Kwan wrote: Reason for CRM: Pt states he has not heard from anyone regarding his referral to Oncology and the scheduling of the appt. Pt request a return call. Cb# [phone]

## 2018-01-13 NOTE — Telephone Encounter (Signed)
Order entered by Dr Tasia Catchings

## 2018-01-16 ENCOUNTER — Encounter (INDEPENDENT_AMBULATORY_CARE_PROVIDER_SITE_OTHER): Payer: Self-pay

## 2018-01-16 NOTE — Telephone Encounter (Signed)
mychart message sent to pt regarding this message. He has been seen by oncology. Message sent for clarity

## 2018-01-17 ENCOUNTER — Other Ambulatory Visit: Payer: Self-pay | Admitting: Family Medicine

## 2018-01-19 ENCOUNTER — Ambulatory Visit (INDEPENDENT_AMBULATORY_CARE_PROVIDER_SITE_OTHER): Payer: Medicare Other | Admitting: *Deleted

## 2018-01-19 DIAGNOSIS — M810 Age-related osteoporosis without current pathological fracture: Secondary | ICD-10-CM

## 2018-01-19 MED ORDER — DENOSUMAB 60 MG/ML ~~LOC~~ SOSY
60.0000 mg | PREFILLED_SYRINGE | Freq: Once | SUBCUTANEOUS | Status: AC
  Administered 2018-01-19: 60 mg via SUBCUTANEOUS

## 2018-01-19 NOTE — Progress Notes (Signed)
Patient presented for Prolia injection to Right arm [place], patient voiced no concerns or complaints during or after injection. 

## 2018-01-21 ENCOUNTER — Other Ambulatory Visit: Payer: Self-pay | Admitting: Student

## 2018-01-23 ENCOUNTER — Ambulatory Visit
Admission: RE | Admit: 2018-01-23 | Discharge: 2018-01-23 | Disposition: A | Discharge status: Home / Self Care | Payer: Medicare Other | Source: Ambulatory Visit | Attending: Oncology | Admitting: Oncology

## 2018-01-23 ENCOUNTER — Other Ambulatory Visit (HOSPITAL_COMMUNITY)
Admission: RE | Admit: 2018-01-23 | Disposition: A | Discharge status: Home / Self Care | Payer: Medicare Other | Source: Ambulatory Visit | Attending: Oncology | Admitting: Oncology

## 2018-01-23 DIAGNOSIS — D7589 Other specified diseases of blood and blood-forming organs: Secondary | ICD-10-CM | POA: Insufficient documentation

## 2018-01-23 DIAGNOSIS — E785 Hyperlipidemia, unspecified: Secondary | ICD-10-CM | POA: Insufficient documentation

## 2018-01-23 DIAGNOSIS — K219 Gastro-esophageal reflux disease without esophagitis: Secondary | ICD-10-CM | POA: Diagnosis not present

## 2018-01-23 DIAGNOSIS — R634 Abnormal weight loss: Secondary | ICD-10-CM | POA: Insufficient documentation

## 2018-01-23 DIAGNOSIS — Z8719 Personal history of other diseases of the digestive system: Secondary | ICD-10-CM | POA: Insufficient documentation

## 2018-01-23 DIAGNOSIS — Z96659 Presence of unspecified artificial knee joint: Secondary | ICD-10-CM | POA: Diagnosis not present

## 2018-01-23 DIAGNOSIS — N189 Chronic kidney disease, unspecified: Secondary | ICD-10-CM | POA: Insufficient documentation

## 2018-01-23 DIAGNOSIS — G4730 Sleep apnea, unspecified: Secondary | ICD-10-CM | POA: Diagnosis not present

## 2018-01-23 DIAGNOSIS — E1122 Type 2 diabetes mellitus with diabetic chronic kidney disease: Secondary | ICD-10-CM | POA: Diagnosis not present

## 2018-01-23 DIAGNOSIS — R61 Generalized hyperhidrosis: Secondary | ICD-10-CM | POA: Diagnosis not present

## 2018-01-23 DIAGNOSIS — Z9884 Bariatric surgery status: Secondary | ICD-10-CM | POA: Diagnosis not present

## 2018-01-23 DIAGNOSIS — Z801 Family history of malignant neoplasm of trachea, bronchus and lung: Secondary | ICD-10-CM | POA: Diagnosis not present

## 2018-01-23 DIAGNOSIS — I509 Heart failure, unspecified: Secondary | ICD-10-CM | POA: Insufficient documentation

## 2018-01-23 DIAGNOSIS — I130 Hypertensive heart and chronic kidney disease with heart failure and stage 1 through stage 4 chronic kidney disease, or unspecified chronic kidney disease: Secondary | ICD-10-CM | POA: Insufficient documentation

## 2018-01-23 DIAGNOSIS — K449 Diaphragmatic hernia without obstruction or gangrene: Secondary | ICD-10-CM | POA: Diagnosis not present

## 2018-01-23 DIAGNOSIS — F419 Anxiety disorder, unspecified: Secondary | ICD-10-CM | POA: Insufficient documentation

## 2018-01-23 DIAGNOSIS — D509 Iron deficiency anemia, unspecified: Secondary | ICD-10-CM | POA: Diagnosis not present

## 2018-01-23 DIAGNOSIS — D72829 Elevated white blood cell count, unspecified: Secondary | ICD-10-CM | POA: Diagnosis not present

## 2018-01-23 DIAGNOSIS — Z79899 Other long term (current) drug therapy: Secondary | ICD-10-CM | POA: Diagnosis not present

## 2018-01-23 LAB — CBC WITH DIFFERENTIAL/PLATELET
BASOS ABS: 0 10*3/uL (ref 0–0.1)
BASOS PCT: 0 %
Eosinophils Absolute: 0.2 10*3/uL (ref 0–0.7)
Eosinophils Relative: 3 %
HEMATOCRIT: 47.8 % (ref 40.0–52.0)
HEMOGLOBIN: 15.8 g/dL (ref 13.0–18.0)
Lymphocytes Relative: 36 %
Lymphs Abs: 2.8 10*3/uL (ref 1.0–3.6)
MCH: 33.8 pg (ref 26.0–34.0)
MCHC: 33 g/dL (ref 32.0–36.0)
MCV: 102.6 fL — ABNORMAL HIGH (ref 80.0–100.0)
Monocytes Absolute: 0.5 10*3/uL (ref 0.2–1.0)
Monocytes Relative: 6 %
NEUTROS ABS: 4.2 10*3/uL (ref 1.4–6.5)
NEUTROS PCT: 55 %
Platelets: 237 10*3/uL (ref 150–440)
RBC: 4.66 MIL/uL (ref 4.40–5.90)
RDW: 14 % (ref 11.5–14.5)
WBC: 7.7 10*3/uL (ref 3.8–10.6)

## 2018-01-23 LAB — GLUCOSE, CAPILLARY: Glucose-Capillary: 146 mg/dL — ABNORMAL HIGH (ref 65–99)

## 2018-01-23 LAB — APTT: aPTT: 27 seconds (ref 24–36)

## 2018-01-23 LAB — PROTIME-INR
INR: 0.9
PROTHROMBIN TIME: 12.1 s (ref 11.4–15.2)

## 2018-01-23 MED ORDER — LIDOCAINE HCL (PF) 1 % IJ SOLN
INTRAMUSCULAR | Status: AC | PRN
  Administered 2018-01-23: 10 mL

## 2018-01-23 MED ORDER — MIDAZOLAM HCL 5 MG/5ML IJ SOLN
INTRAMUSCULAR | Status: AC | PRN
  Administered 2018-01-23 (×3): 1 mg via INTRAVENOUS

## 2018-01-23 MED ORDER — HEPARIN SOD (PORK) LOCK FLUSH 100 UNIT/ML IV SOLN
INTRAVENOUS | Status: AC
  Filled 2018-01-23: qty 5

## 2018-01-23 MED ORDER — FENTANYL CITRATE (PF) 100 MCG/2ML IJ SOLN
INTRAMUSCULAR | Status: AC
  Filled 2018-01-23: qty 4

## 2018-01-23 MED ORDER — FENTANYL CITRATE (PF) 100 MCG/2ML IJ SOLN
INTRAMUSCULAR | Status: AC | PRN
  Administered 2018-01-23: 50 ug via INTRAVENOUS
  Administered 2018-01-23 (×2): 25 ug via INTRAVENOUS

## 2018-01-23 MED ORDER — HYDROCODONE-ACETAMINOPHEN 5-325 MG PO TABS
1.0000 | ORAL_TABLET | ORAL | Status: DC | PRN
  Filled 2018-01-23: qty 2

## 2018-01-23 MED ORDER — SODIUM CHLORIDE 0.9 % IV SOLN: INTRAVENOUS | Status: DC

## 2018-01-23 MED ORDER — MIDAZOLAM HCL 5 MG/5ML IJ SOLN
INTRAMUSCULAR | Status: AC
  Filled 2018-01-23: qty 5

## 2018-01-23 NOTE — Procedures (Signed)
CT guided bone marrow biopsy.  2 aspirates and 1 core biopsy.  Minimal blood loss and no immediate complication.  

## 2018-01-23 NOTE — H&P (Signed)
Chief Complaint: Patient was seen in consultation today for CT-guided bone marrow biopsy at the request of Yu,Zhou  Referring Physician(s): Yu,Zhou  Patient Status: [place] - Out-pt  History of Present Illness: Justin Patel is a 67 y.o. male with unexplained weight loss since January, mild leukocytosis and perivascular dermatitis.  Bone marrow biopsy has been requested as part of the patient's work-up.  Patient has chronic severe back pain and is scheduled to see neurosurgery about upcoming surgery.  Back pain is 7 out of 10 and this is his baseline pain level.  He has no other complaints today.  Past Medical History:  Diagnosis Date  . 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 Anxiety   . Arthritis   . 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  removal scheduled to clear margins.  . CHF (congestive heart failure) ([place])    no longer after weight loss  . Chronic kidney disease    cysts  . Degenerative disc disease   . Diabetes mellitus    no longer diabetic-or on meds  . Family history of adverse reaction to anesthesia    sons wake up combative  . Gastric ulcer   . GERD (gastroesophageal reflux disease)   . H/O hiatal hernia   . Headache(784.0)    sinus  . Heart murmur   . Hx of congestive heart failure   . Hyperlipidemia   . Hypertension   . Iron deficiency anemia 02/28/2015  . Sacral fracture, closed ([place])   . Seizures ([place])    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, after GI bleed  . Sleep apnea    hx not now since wt loss  . Stones in the urinary tract   . Syncope and collapse     Past Surgical History:  Procedure Laterality Date  . ANTERIOR CERVICAL DECOMP/DISCECTOMY FUSION  01/26/2012   Procedure: ANTERIOR CERVICAL DECOMPRESSION/DISCECTOMY FUSION 3 LEVELS;  Surgeon: Floyce Stakes, MD;  Location: [facility] NEURO [facility];  Service: Neurosurgery;  Laterality: N/A;  Cervical three-four Cervical four-five Cervical five-six  Cervical six-seven , Anterior cervical decompression/diskectomy, fusion, plate  . APPENDECTOMY    . [place], normal  . CARDIAC CATHETERIZATION     [place]  . COLONOSCOPY WITH PROPOFOL N/A 12/27/2017   Procedure: COLONOSCOPY WITH PROPOFOL;  Surgeon: Toledo, Benay Pike, MD;  Location: [facility] ENDOSCOPY;  Service: Gastroenterology;  Laterality: N/A;  . ESOPHAGOGASTRODUODENOSCOPY (EGD) WITH PROPOFOL N/A 12/27/2017   Procedure: ESOPHAGOGASTRODUODENOSCOPY (EGD) WITH PROPOFOL;  Surgeon: Toledo, Benay Pike, MD;  Location: [facility] ENDOSCOPY;  Service: Gastroenterology;  Laterality: N/A;  . GASTRIC BYPASS  2010   [place]  . HERNIA REPAIR  2000   hiatal  . JOINT REPLACEMENT    . KNEE ARTHROSCOPY     bilateral, left x 2  . NASAL SINUS SURGERY     x5  . OSTEOTOMY  2001   left  . TONSILLECTOMY    . TOTAL KNEE ARTHROPLASTY Left 05/2014   Dr. Marry Guan  . UVULOPALATOPLASTY  2011    Allergies: Altace [ramipril]; Levaquin [levofloxacin in d5w]; Metformin; Trulicity [dulaglutide]; Conray [iothalamate]; and Lyrica [pregabalin]  Medications: Prior to Admission medications   Medication Sig Start Date End Date Taking? Authorizing Provider  acetaminophen (TYLENOL) 500 MG tablet Take 1,000 mg by mouth every 6 (six) hours as needed.   Yes [provider]  DULoxetine (CYMBALTA) 60 MG capsule TAKE ONE CAPSULE  BY MOUTH DAILY 01/17/18  Yes [name], MD  esomeprazole (NEXIUM) 40 MG capsule Take 1 capsule (40 mg total) by mouth daily. 10/18/17  Yes [name], MD  furosemide (LASIX) 20 MG tablet TAKE 1 TABLET BY MOUTH ONCE DAILY 12/20/17  Yes [name], MD  JARDIANCE 25 MG TABS tablet TAKE 1 TABLET BY MOUTH ONCE DAILY 01/10/18  Yes [name], MD  losartan (COZAAR) 100 MG tablet TAKE 1 TABLET BY MOUTH DAILY 12/20/17  Yes [name], MD  metoprolol succinate (TOPROL-XL) 50 MG 24 hr tablet Take 1 tablet (50 mg total) by mouth  daily. Take with or immediately following a meal. 06/06/17  Yes [name], MD  rosuvastatin (CRESTOR) 20 MG tablet Take 1 tablet (20 mg total) by mouth daily. 06/06/17  Yes [name], MD  sucralfate (CARAFATE) 1 g tablet TAKE 1 TABLET BY MOUTH FOUR TIMES DAILY 01/10/18  Yes [name], MD  blood glucose meter kit and supplies KIT Dispense based on patient and insurance preference. Check once daily. ICD doing E11.9. 06/30/16   [name], MD  denosumab (PROLIA) 60 MG/ML SOSY injection Inject 60 mg into the skin every 6 (six) months.    [provider]  HYDROcodone-acetaminophen (NORCO) 7.5-325 MG tablet take 1 tablet by mouth every 6 hours if needed for pain 07/12/17   [provider]  nystatin cream (MYCOSTATIN) Apply 1 application topically 2 (two) times daily. Patient not taking: Reported on 01/23/2018 05/03/17   [name], MD  tamsulosin (FLOMAX) 0.4 MG CAPS capsule TAKE 1 CAPSULE BY MOUTH ONCE DAILY Patient not taking: Reported on 12/27/2017 12/20/17   [name], MD  Vitamin D, Ergocalciferol, (DRISDOL) 50000 units CAPS capsule Take 1 capsule (50,000 Units total) by mouth every 7 (seven) days. Patient not taking: Reported on 01/23/2018 05/27/17   [name], MD     Family History  Problem Relation Age of Onset  . Dementia Mother 24  . Heart disease Father 70  . Diabetes Father   . Lymphoma Sister        lymphoma, stage 4  . Ovarian cancer Sister 3       Ovarian  . Lupus Sister   . Prostate cancer Maternal Uncle   . Skin cancer Maternal Uncle   . Tongue cancer Maternal Uncle        uncle died of heart attack  . Alcoholism Maternal Uncle   . Lung cancer Paternal Aunt        pat aunts x 4 died of lung cancer  . Lung cancer Paternal Aunt   . Lung cancer Paternal Aunt   . Lung cancer Paternal Aunt   . Mesothelioma Cousin        paternal cousin    Social History   Socioeconomic History  . Marital status:  Married    Spouse name: Not on file  . Number of children: Not on file  . Years of education: Not on file  . Highest education level: Not on file  Occupational History  . Not on file  Social Needs  . Financial resource strain: Not on file  . Food insecurity:    Worry: Not on file    Inability: Not on file  . Transportation needs:    Medical: Not on file    Non-medical: Not on file  Tobacco Use  . Smoking status: Never Smoker  . Smokeless tobacco: Never Used  Substance  and Sexual Activity  . Alcohol use: No  . Drug use: No  . Sexual activity: Yes    Partners: Female  Lifestyle  . Physical activity:    Days per week: Not on file    Minutes per session: Not on file  . Stress: Not on file  Relationships  . Social connections:    Talks on phone: Not on file    Gets together: Not on file    Attends religious service: Not on file    Active member of club or organization: Not on file    Attends meetings of clubs or organizations: Not on file    Relationship status: Not on file  Other Topics Concern  . Not on file  Social History Narrative   Lives in [place] with wife, [place]. 2 sons, William Hamburger, Lennette Bihari 59.. 4 grandchildren      Work - Retired, previously taught music in [place] - regular diet, limited quantities after gastric bypass      Exercise - no regular, limited by arthritis in knees, occasional water aerobics      Review of Systems  Constitutional: Positive for appetite change and unexpected weight change.  Respiratory: Negative.   Cardiovascular: Negative.   Gastrointestinal: Negative.   Genitourinary: Negative.   Musculoskeletal: Positive for back pain.    Vital Signs: BP (!) 148/93   Pulse 78   Temp 98.1 F (36.7 C) (Oral)   Resp 14   Ht 5' 5.5" (1.664 m)   Wt 176 lb (79.8 kg)   SpO2 96%   BMI 28.84 kg/m   Physical Exam  Constitutional: No distress.  HENT:  Mouth/Throat: Oropharynx is clear and moist.  Uvula has  been removed.  Cardiovascular: Normal rate, regular rhythm and normal heart sounds.  Pulmonary/Chest: Effort normal and breath sounds normal.  Abdominal: Soft. Bowel sounds are normal.    Imaging: No results found.  Labs:  CBC: Recent Labs    03/30/17 1409 11/02/17 1541 12/09/17 1619 01/23/18 0813  WBC 9.3 12.9* 11.0* 7.7  HGB 15.1 16.4 17.0 15.8  HCT 46.2 49.6 50.9 47.8  PLT 232.0 297.0 242 237    COAGS: Recent Labs    12/09/17 1619 01/23/18 0813  INR 0.98 0.90  APTT 27 27    BMP: Recent Labs    10/19/17 1330 10/26/17 1052 11/02/17 1541 12/09/17 1619  NA 141 141 140 138  K 4.4 4.1 4.3 4.5  CL 102 104 104 103  CO2 _0 GLUCOSE 100* 133* 180* 171*  BUN 38* 29* 27* 28*  CALCIUM 10.7* 9.7 10.1 10.1  CREATININE 1.54* 1.33 1.26 1.26*  GFRNONAA  --   --   --  58*  GFRAA  --   --   --  >60    LIVER FUNCTION TESTS: Recent Labs    03/30/17 1409 05/27/17 1407 11/02/17 1541 12/09/17 1619  BILITOT 0.3 0.3 0.4 0.5  AST _1 ALT 43 31 49 48  ALKPHOS 58 60 50 42  PROT 7.5 7.2 7.4 8.0  ALBUMIN 4.5 4.3 4.2 4.7    TUMOR MARKERS: No results for input(s): AFPTM, CEA, CA199, CHROMGRNA in the last 8760 hours.  Assessment and Plan:   67 year old male with unexplained weight loss and mild leukocytosis.  Request for bone marrow biopsy.  CT-guided bone marrow biopsy was explained to the patient.  Patient has a good understanding of the risks and  benefits.  The use of moderate sedation was also discussed with the patient.  Informed consent was obtained for CT-guided bone marrow biopsy with moderate sedation.  Thank you for this interesting consult.  I greatly enjoyed meeting Justin Patel and look forward to participating in their care.  A copy of this report was sent to the requesting provider on this date.  Electronically Signed: Burman Riis, MD 01/23/2018, 9:14 AM   I spent a total of  15 Minutes   in face to face in clinical consultation,  greater than 50% of which was counseling/coordinating care for CT-guided bone marrow biopsy.

## 2018-01-25 ENCOUNTER — Ambulatory Visit: Payer: Medicare Other | Admitting: Family Medicine

## 2018-01-25 ENCOUNTER — Encounter: Payer: Self-pay | Admitting: Family Medicine

## 2018-01-25 ENCOUNTER — Telehealth: Payer: Self-pay

## 2018-01-25 VITALS — BP 120/82 | HR 83 | Temp 98.3°F | Wt 185.8 lb

## 2018-01-25 DIAGNOSIS — M51369 Other intervertebral disc degeneration, lumbar region without mention of lumbar back pain or lower extremity pain: Secondary | ICD-10-CM

## 2018-01-25 DIAGNOSIS — M5136 Other intervertebral disc degeneration, lumbar region: Secondary | ICD-10-CM | POA: Diagnosis not present

## 2018-01-25 DIAGNOSIS — R634 Abnormal weight loss: Secondary | ICD-10-CM | POA: Diagnosis not present

## 2018-01-25 DIAGNOSIS — R7612 Nonspecific reaction to cell mediated immunity measurement of gamma interferon antigen response without active tuberculosis: Secondary | ICD-10-CM | POA: Diagnosis not present

## 2018-01-25 DIAGNOSIS — J329 Chronic sinusitis, unspecified: Secondary | ICD-10-CM | POA: Insufficient documentation

## 2018-01-25 DIAGNOSIS — R21 Rash and other nonspecific skin eruption: Secondary | ICD-10-CM

## 2018-01-25 DIAGNOSIS — J0100 Acute maxillary sinusitis, unspecified: Secondary | ICD-10-CM

## 2018-01-25 DIAGNOSIS — E119 Type 2 diabetes mellitus without complications: Secondary | ICD-10-CM | POA: Diagnosis not present

## 2018-01-25 LAB — POCT GLYCOSYLATED HEMOGLOBIN (HGB A1C): HEMOGLOBIN A1C: 7.1 % — AB (ref 4.0–5.6)

## 2018-01-25 MED ORDER — AMOXICILLIN-POT CLAVULANATE 875-125 MG PO TABS: 1.0000 | ORAL_TABLET | Freq: Two times a day (BID) | ORAL | 0 refills | Status: DC

## 2018-01-25 MED ORDER — BENZONATATE 200 MG PO CAPS: 200.0000 mg | ORAL_CAPSULE | Freq: Two times a day (BID) | ORAL | 0 refills | Status: DC | PRN

## 2018-01-25 NOTE — Progress Notes (Signed)
[name], MD Phone: [phone]  Justin Patel is a 67 y.o. male who presents today for f/u.  CC: weight loss, congestion, rash, chronic back pain  Patient notes his weight is up a little bit.  He underwent bone marrow biopsy recently.  Results have not returned yet.  He continues to have some sweats at night and during the day.  Possible petechiae in his legs though they seem to be associated with hair follicles.  The rash he had previously is improving as well.  He does still bruise easily.  He has follow-up with oncology in several weeks.  He does not feel well and has fatigue.  He has had some nausea and diarrhea.  He has chronic pain in his back and saw neurosurgery earlier this week.  They decided to hold off on any surgery until his bone marrow biopsy has been completed and there is a plan in place.  If his biopsy appears to be normal it sounds as though neurosurgery will take him fairly quickly for an operation.  He notes he has been coughing a lot over the last 3 weeks.  Notes facial and nasal congestion as well as chest congestion.  He is blowing green mucus out of his nose.  Coughing up yellow phlegm.  Has postnasal drip.  No shortness of breath.  Initially had fever of 101 F.  He was being treated for latent TB though discontinue the medication related to severe stomach discomfort that occurred with this.  With the patient's cough and prior fevers CMA contacted the health department to get their input.  The history was relayed stopped his latent TB treatment advised of fever at the onset of illness several weeks ago facial and nasal congestion as well as postnasal drip and chest congestion with cough productive of yellow mucus.  They noted that no TB precautions were needed at this time was just seen there in active TB was ruled out.  They felt it more likely represented an upper respiratory issue.  The patient was subsequently masked.  Social History   Tobacco Use  Smoking  Status Never Smoker  Smokeless Tobacco Never Used     ROS see history of present illness  Objective  Physical Exam Vitals:   01/25/18 1311  BP: 120/82  Pulse: 83  Temp: 98.3 F (36.8 C)  SpO2: 97%    BP Readings from Last 3 Encounters:  01/25/18 120/82  01/23/18 117/83  12/27/17 119/80   Wt Readings from Last 3 Encounters:  01/25/18 185 lb 12.8 oz (84.3 kg)  01/23/18 176 lb (79.8 kg)  12/27/17 180 lb (81.6 kg)    Physical Exam  Constitutional: No distress.  HENT:  Head: Normocephalic and atraumatic.  Postnasal drip noted with mild erythema of the posterior oropharynx, bilateral TMs normal, maxillary and frontal sinuses tender to percussion  Eyes: Pupils are equal, round, and reactive to light. Conjunctivae are normal.  Neck: Neck supple.  Cardiovascular: Normal rate, regular rhythm and normal heart sounds.  Pulmonary/Chest: Effort normal and breath sounds normal.  Musculoskeletal: He exhibits no edema.  Lymphadenopathy:    He has no cervical adenopathy.  Neurological: He is alert.  Skin: Skin is warm and dry. He is not diaphoretic.     Assessment/Plan: Please see individual problem list.  Diabetes ([place]) A1c today.  DDD (degenerative disc disease), lumbar He will continue to see neurosurgery.  Abnormal weight loss Recently had bone marrow biopsy.  He has had a very extensive work-up. symptoms  are concerning for a hematologic malignancy process.  If the bone marrow biopsy is unremarkable the weight loss potentially could be related to him not feeling well from his back pain.  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  Unable to tolerate treatment.  He will monitor for signs of active symptoms.  Rash Improving.  The scattered red lesions on his legs could be associated with hair follicles or could be petechiae.  Bone marrow biopsy will help determine a specific cause.  Sinusitis Symptoms most consistent with  sinusitis. Treating with augmentin and tessalon. If tessalon is not helpful he will contact us tomorrow.    Orders Placed This Encounter  Procedures  . POCT HgB A1C    Meds ordered this encounter  Medications  . amoxicillin-clavulanate (AUGMENTIN) 875-125 MG tablet    Sig: Take 1 tablet by mouth 2 (two) times daily.    Dispense:  14 tablet    Refill:  0  . benzonatate (TESSALON) 200 MG capsule    Sig: Take 1 capsule (200 mg total) by mouth 2 (two) times daily as needed for cough.    Dispense:  20 capsule    Refill:  0     [name], MD [place]

## 2018-01-25 NOTE — Assessment & Plan Note (Signed)
He will continue to see neurosurgery.

## 2018-01-25 NOTE — Assessment & Plan Note (Signed)
A1c today.  

## 2018-01-25 NOTE — Patient Instructions (Signed)
Nice to see you. We will treat you for a sinus infection with Augmentin and Tessalon. If you develop trouble breathing, cough productive of blood, recurrent fevers, or any new or changing symptoms please seek medical attention immediately.

## 2018-01-25 NOTE — Assessment & Plan Note (Signed)
Improving.  The scattered red lesions on his legs could be associated with hair follicles or could be petechiae.  Bone marrow biopsy will help determine a specific cause.

## 2018-01-25 NOTE — Assessment & Plan Note (Signed)
Recently had bone marrow biopsy.  He has had a very extensive work-up. symptoms are concerning for a hematologic malignancy process.  If the bone marrow biopsy is unremarkable the weight loss potentially could be related to him not feeling well from his back pain.

## 2018-01-25 NOTE — Assessment & Plan Note (Signed)
Diagnosed with latent TB by the health department.  Unable to tolerate treatment.  He will monitor for signs of active symptoms.

## 2018-01-25 NOTE — Assessment & Plan Note (Addendum)
Symptoms most consistent with sinusitis. Treating with augmentin and tessalon. If tessalon is not helpful he will contact us tomorrow.

## 2018-01-25 NOTE — Telephone Encounter (Signed)
Called Sharyn Lull [place] [facility]. Informed her that the patient was recently treated for latent TB and stopped treatment before treatment was completed. Informed her that the patient is in the officre and has been experiencing fevers of 101 a couple days ago and has also been experiencing nasal and chest congestion. I asked her what precautions we should take in the office. She states that no precautions need to be taken since he was just in their office and active TB was ruled out.

## 2018-01-26 ENCOUNTER — Telehealth: Payer: Self-pay

## 2018-01-26 MED ORDER — HYDROCOD POLST-CPM POLST ER 10-8 MG/5ML PO SUER: 5.0000 mL | Freq: Two times a day (BID) | ORAL | 0 refills | Status: DC | PRN

## 2018-01-26 NOTE — Addendum Note (Signed)
Addended by: [name] on: 01/26/2018 02:41 PM   Modules accepted: Orders

## 2018-01-26 NOTE — Telephone Encounter (Signed)
Copied from [place] [phone]. Topic: Quick Communication - See Telephone Encounter >> Jan 26, 2018  1:19 PM Juanda Chance, CMA wrote: CRM for notification. See Telephone encounter for: 01/26/18. Left message to return call, ok for pec to speak to patient about message below >> Jan 26, 2018  2:06 PM Stovall, Shana A wrote: Pt called in and stated that he has not taken the hydrocodone in about a week now.  He stated that he would stop this med so he could get a cough med that would help .  Pt was requesting if it could be call in today before the weather got bad?    Pharmacy - [facility] at [address] number [phone]

## 2018-01-26 NOTE — Telephone Encounter (Signed)
Copied from [place] [id number]. Topic: Inquiry >> Jan 26, 2018 11:06 AM Pricilla Handler wrote: Reason for CRM: Patient called stating that Dr. Caryl Bis wanted him to call in today if the Tessalon [place] did not give the patient relief. Patient did not sleep at all last night. Patient would like for Dr. Caryl Bis to send the other medication Tussinex to his pharmacy. Please call patient [phone].        Thank You!!!

## 2018-01-26 NOTE — Telephone Encounter (Signed)
Left message to return call, ok for pec to speak to patient about message below 

## 2018-01-26 NOTE — Telephone Encounter (Signed)
Please advise 

## 2018-01-26 NOTE — Telephone Encounter (Signed)
Please confirm whether or not the patient is currently taking the hydrocodone.  Patient advised me during his visit that he was not taking it though it appears this was filled on 01/10/2018.  If he has been taking this I cannot prescribe the cough medication.  Thanks.

## 2018-01-26 NOTE — Telephone Encounter (Signed)
Tussionex sent to patient's pharmacy.  He should not take the hydrocodone while he is taking the Tussionex.  The Tussionex may make him very drowsy so he should not drive when he takes this.  If it makes him excessively drowsy he should let us know.  I believe he does not drink much alcohol if any though please advise that he should not drink alcohol while taking this.

## 2018-01-26 NOTE — Telephone Encounter (Signed)
Patient notified and verbalized understanding. 

## 2018-01-27 ENCOUNTER — Encounter (HOSPITAL_COMMUNITY): Payer: Self-pay | Admitting: Oncology

## 2018-01-27 NOTE — Telephone Encounter (Signed)
See other message

## 2018-02-06 ENCOUNTER — Inpatient Hospital Stay: Payer: Medicare Other | Attending: Internal Medicine | Admitting: Internal Medicine

## 2018-02-06 ENCOUNTER — Ambulatory Visit: Payer: Medicare Other | Admitting: Family Medicine

## 2018-02-06 ENCOUNTER — Other Ambulatory Visit: Payer: Self-pay

## 2018-02-06 VITALS — BP 143/92 | HR 81 | Resp 20 | Ht 65.5 in | Wt 184.0 lb

## 2018-02-06 DIAGNOSIS — Z79899 Other long term (current) drug therapy: Secondary | ICD-10-CM | POA: Insufficient documentation

## 2018-02-06 DIAGNOSIS — Z8042 Family history of malignant neoplasm of prostate: Secondary | ICD-10-CM | POA: Insufficient documentation

## 2018-02-06 DIAGNOSIS — Z807 Family history of other malignant neoplasms of lymphoid, hematopoietic and related tissues: Secondary | ICD-10-CM | POA: Diagnosis not present

## 2018-02-06 DIAGNOSIS — D72829 Elevated white blood cell count, unspecified: Secondary | ICD-10-CM | POA: Diagnosis not present

## 2018-02-06 DIAGNOSIS — Z8041 Family history of malignant neoplasm of ovary: Secondary | ICD-10-CM | POA: Diagnosis not present

## 2018-02-06 DIAGNOSIS — E1122 Type 2 diabetes mellitus with diabetic chronic kidney disease: Secondary | ICD-10-CM | POA: Diagnosis not present

## 2018-02-06 DIAGNOSIS — R634 Abnormal weight loss: Secondary | ICD-10-CM

## 2018-02-06 DIAGNOSIS — E785 Hyperlipidemia, unspecified: Secondary | ICD-10-CM | POA: Insufficient documentation

## 2018-02-06 DIAGNOSIS — M549 Dorsalgia, unspecified: Secondary | ICD-10-CM

## 2018-02-06 DIAGNOSIS — I130 Hypertensive heart and chronic kidney disease with heart failure and stage 1 through stage 4 chronic kidney disease, or unspecified chronic kidney disease: Secondary | ICD-10-CM | POA: Diagnosis not present

## 2018-02-06 DIAGNOSIS — R5383 Other fatigue: Secondary | ICD-10-CM | POA: Insufficient documentation

## 2018-02-06 DIAGNOSIS — N189 Chronic kidney disease, unspecified: Secondary | ICD-10-CM | POA: Insufficient documentation

## 2018-02-06 DIAGNOSIS — R21 Rash and other nonspecific skin eruption: Secondary | ICD-10-CM

## 2018-02-06 DIAGNOSIS — Z9884 Bariatric surgery status: Secondary | ICD-10-CM | POA: Diagnosis not present

## 2018-02-06 DIAGNOSIS — Z800 Family history of malignant neoplasm of digestive organs: Secondary | ICD-10-CM | POA: Insufficient documentation

## 2018-02-06 DIAGNOSIS — Z801 Family history of malignant neoplasm of trachea, bronchus and lung: Secondary | ICD-10-CM | POA: Insufficient documentation

## 2018-02-06 DIAGNOSIS — K219 Gastro-esophageal reflux disease without esophagitis: Secondary | ICD-10-CM | POA: Insufficient documentation

## 2018-02-06 DIAGNOSIS — M810 Age-related osteoporosis without current pathological fracture: Secondary | ICD-10-CM

## 2018-02-06 DIAGNOSIS — G8929 Other chronic pain: Secondary | ICD-10-CM

## 2018-02-06 DIAGNOSIS — M1990 Unspecified osteoarthritis, unspecified site: Secondary | ICD-10-CM | POA: Insufficient documentation

## 2018-02-06 NOTE — Progress Notes (Signed)
[place] NOTE  Patient Care Team: [name], MD as PCP - General (Family Medicine)  CHIEF COMPLAINTS/PURPOSE OF CONSULTATION: Leukocytosis  # Leucocytosis/weight loss/extreme fatigue-bone marrow biopsy-June 2019-unremarkable.  # # SDH [[place]- [id number]]; [name]-is here to review the results of his bone marrow biopsy was ordered for mild leukocytosis significantly discussed.  Patient continues to have intermittent skin rash. patient also complains of chronic joint pains.  Weight loss but stable for the last many months.  No fevers.  He does bruise easily.  Skin rash is itching.   Review of Systems  Constitutional: Positive for weight loss. Negative for chills, diaphoresis, fever and malaise/fatigue.  HENT: Negative.  Negative for hearing loss.   Eyes: Negative.   Respiratory: Negative.   Cardiovascular: Negative.   Gastrointestinal: Negative.   Genitourinary: Negative.   Musculoskeletal: Positive for joint pain.  Skin: Positive for itching and rash.  Neurological: Negative.   Endo/Heme/Allergies: Bruises/bleeds easily.  Psychiatric/Behavioral: Negative.      MEDICAL HISTORY:  Past Medical History:  Diagnosis Date  . 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 Anxiety   . Arthritis   . 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  removal scheduled to clear margins.  . CHF (congestive heart failure) ([place])    no longer after weight loss  . Chronic kidney disease    cysts  . Degenerative disc disease   . Diabetes mellitus    no longer diabetic-or on meds  . Family history of adverse reaction to anesthesia    sons wake up combative  . Gastric ulcer   . GERD (gastroesophageal reflux disease)   . H/O hiatal hernia   . Headache(784.0)     sinus  . Heart murmur   . Hx of congestive heart failure   . Hyperlipidemia   . Hypertension   . Iron deficiency anemia 02/28/2015  . Sacral fracture, closed ([place])   . Seizures ([place])    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, after GI bleed  . Sleep apnea    hx not now since wt loss  . Stones in the urinary tract   . Syncope and collapse     SURGICAL HISTORY: Past Surgical History:  Procedure Laterality Date  . ANTERIOR CERVICAL DECOMP/DISCECTOMY FUSION  01/26/2012   Procedure: ANTERIOR CERVICAL DECOMPRESSION/DISCECTOMY FUSION 3 LEVELS;  Surgeon: Floyce Stakes, MD;  Location: [facility];  Service: Neurosurgery;  Laterality: N/A;  Cervical three-four Cervical four-five Cervical five-six Cervical six-seven , Anterior cervical decompression/diskectomy, fusion, plate  . APPENDECTOMY    . [place], normal  . CARDIAC CATHETERIZATION     [place]  . COLONOSCOPY WITH PROPOFOL N/A 12/27/2017   Procedure: COLONOSCOPY WITH PROPOFOL;  Surgeon: Toledo, Benay Pike, MD;  Location: [facility] ENDOSCOPY;  Service: Gastroenterology;  Laterality: N/A;  . ESOPHAGOGASTRODUODENOSCOPY (EGD) WITH PROPOFOL N/A 12/27/2017   Procedure: ESOPHAGOGASTRODUODENOSCOPY (EGD) WITH PROPOFOL;  Surgeon: Toledo, Benay Pike, MD;  Location: [facility] ENDOSCOPY;  Service: Gastroenterology;  Laterality: N/A;  . GASTRIC BYPASS  2010   [place]  . HERNIA REPAIR  2000   hiatal  . JOINT REPLACEMENT    . KNEE ARTHROSCOPY     bilateral, left x 2  .  NASAL SINUS SURGERY     x5  . OSTEOTOMY  2001   left  . TONSILLECTOMY    . TOTAL KNEE ARTHROPLASTY Left 05/2014   Dr. Marry Guan  . UVULOPALATOPLASTY  2011    SOCIAL HISTORY: Social History   Socioeconomic History  . Marital status: Married    Spouse name: Not on file  . Number of children: Not on file  . Years of education: Not on file  . Highest education level: Not on file  Occupational History  . Not on file  Social Needs  .  Financial resource strain: Not on file  . Food insecurity:    Worry: Not on file    Inability: Not on file  . Transportation needs:    Medical: Not on file    Non-medical: Not on file  Tobacco Use  . Smoking status: Never Smoker  . Smokeless tobacco: Never Used  Substance and Sexual Activity  . Alcohol use: No  . Drug use: No  . Sexual activity: Yes    Partners: Female  Lifestyle  . Physical activity:    Days per week: Not on file    Minutes per session: Not on file  . Stress: Not on file  Relationships  . Social connections:    Talks on phone: Not on file    Gets together: Not on file    Attends religious service: Not on file    Active member of club or organization: Not on file    Attends meetings of clubs or organizations: Not on file    Relationship status: Not on file  . Intimate partner violence:    Fear of current or ex partner: Not on file    Emotionally abused: Not on file    Physically abused: Not on file    Forced sexual activity: Not on file  Other Topics Concern  . Not on file  Social History Narrative   Lives in [place] with wife, Lyman. 2 sons, William Hamburger, Lennette Bihari 65.. 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 Dementia Mother 29  . Heart disease Father 58  . Diabetes Father   . Lymphoma Sister        lymphoma, stage 4  . Ovarian cancer Sister 25       Ovarian  . Lupus Sister   . Prostate cancer Maternal Uncle   . Skin cancer Maternal Uncle   . Tongue cancer Maternal Uncle        uncle died of heart attack  . Alcoholism Maternal Uncle   . Lung cancer Paternal Aunt        pat aunts x 4 died of lung cancer  . Lung cancer Paternal Aunt   . Lung cancer Paternal Aunt   . Lung  cancer Paternal Aunt   . Mesothelioma Cousin        paternal cousin    ALLERGIES:  is allergic to altace [ramipril]; levaquin [levofloxacin in U7O]; metformin; trulicity [dulaglutide]; conray [iothalamate]; and lyrica [pregabalin].  MEDICATIONS:  Current Outpatient Medications  Medication Sig Dispense Refill  . blood glucose meter kit and supplies KIT Dispense based on patient and insurance preference. Check once daily.  ICD doing E11.9. 1 each 0  . denosumab (PROLIA) 60 MG/ML SOSY injection Inject 60 mg into the skin every 6 (six) months.    . DULoxetine (CYMBALTA) 60 MG capsule TAKE ONE CAPSULE BY MOUTH DAILY 30 capsule 0  . esomeprazole (NEXIUM) 40 MG capsule Take 1 capsule (40 mg total) by mouth daily. 30 capsule 3  . furosemide (LASIX) 20 MG tablet TAKE 1 TABLET BY MOUTH ONCE DAILY 90 tablet 0  . JARDIANCE 25 MG TABS tablet TAKE 1 TABLET BY MOUTH ONCE DAILY 30 tablet 1  . losartan (COZAAR) 100 MG tablet TAKE 1 TABLET BY MOUTH DAILY 90 tablet 0  . metoprolol succinate (TOPROL-XL) 50 MG 24 hr tablet Take 1 tablet (50 mg total) by mouth daily. Take with or immediately following a meal. 30 tablet 6  . nystatin cream (MYCOSTATIN) Apply 1 application topically 2 (two) times daily. 30 g 0  . rosuvastatin (CRESTOR) 20 MG tablet Take 1 tablet (20 mg total) by mouth daily. 30 tablet 6  . sucralfate (CARAFATE) 1 g tablet TAKE 1 TABLET BY MOUTH FOUR TIMES DAILY 120 tablet 5  . Vitamin D, Ergocalciferol, (DRISDOL) 50000 units CAPS capsule Take 1 capsule (50,000 Units total) by mouth every 7 (seven) days. 8 capsule 0  . acetaminophen (TYLENOL) 500 MG tablet Take 1,000 mg by mouth every 6 (six) hours as needed.    Marland Kitchen HYDROcodone-acetaminophen (NORCO) 7.5-325 MG tablet take 1 tablet by mouth every 6 hours if needed for pain  0   No current facility-administered medications for this visit.       Marland Kitchen  PHYSICAL EXAMINATION: ECOG PERFORMANCE STATUS: 1 - Symptomatic but completely ambulatory  Vitals:    02/06/18 1454  BP: (!) 143/92  Pulse: 81  Resp: 20   Filed Weights   02/06/18 1454  Weight: 184 lb (83.5 kg)    Physical Exam  Constitutional: He is oriented to person, place, and time and well-developed, well-nourished, and in no distress.  HENT:  Head: Normocephalic and atraumatic.  Eyes: Pupils are equal, round, and reactive to light. EOM are normal.  Neck: Normal range of motion. Neck supple.  Cardiovascular: Normal rate, regular rhythm, normal heart sounds and intact distal pulses.  Pulmonary/Chest: Effort normal and breath sounds normal.  Abdominal: Soft. Bowel sounds are normal.  Musculoskeletal: Normal range of motion.  Neurological: He is alert and oriented to person, place, and time. Gait normal.  Skin: Skin is warm. Rash noted.  Psychiatric: Affect normal.     LABORATORY DATA:  I have reviewed the data as listed Lab Results  Component Value Date   WBC 7.7 01/23/2018   HGB 15.8 01/23/2018   HCT 47.8 01/23/2018   MCV 102.6 (H) 01/23/2018   PLT 237 01/23/2018   Recent Labs    05/27/17 1407  10/26/17 1052 11/02/17 1541 12/09/17 1619  NA 136   < > 141 140 138  K 4.3   < > 4.1 4.3 4.5  CL 100   < > 104 104 103  CO2 28   < > 24 26 24   GLUCOSE 200*   < > 133* 180* 171*  BUN 39*   < > 29* 27* 28*  CREATININE 1.30   < > 1.33 1.26 1.26*  CALCIUM 9.3   < > 9.7 10.1 10.1  GFRNONAA  --   --   --   --  58*  GFRAA  --   --   --   --  >60  PROT 7.2  --   --  7.4 8.0  ALBUMIN 4.3  --   --  4.2 4.7  AST 18  --   --  29 31  ALT 31  --   --  49 48  ALKPHOS 60  --   --  50 42  BILITOT 0.3  --   --  0.4 0.5   < > = values in this interval not displayed.    RADIOGRAPHIC STUDIES: I have personally reviewed the radiological images as listed and agreed with the findings in the report. Ct Biopsy  Result Date: 01/23/2018 INDICATION: 67 year old with abnormal weight loss and unexplained night sweats. EXAM: CT GUIDED BONE MARROW ASPIRATES AND BIOPSY Physician: Stephan Minister.  Anselm Pancoast, MD MEDICATIONS: None. ANESTHESIA/SEDATION: Fentanyl 100 mcg IV; Versed 3.0 mg IV Moderate Sedation Time:  23 minutes The patient was continuously monitored during the procedure by the interventional radiology nurse under my direct supervision. COMPLICATIONS: None immediate. PROCEDURE: The procedure was explained to the patient. The risks and benefits of the procedure were discussed and the patient's questions were addressed. Informed consent was obtained from the patient. The patient was placed prone on CT table. Images of the pelvis were obtained. The right side of back was prepped and draped in sterile fashion. The skin and right posterior ilium were anesthetized with 1% lidocaine. 11 gauge bone needle was directed into the right ilium with CT guidance. Two aspirates and one core biopsy were obtained. Bandage placed over the puncture site. IMPRESSION: CT guided bone marrow aspiration and core biopsy. Electronically Signed   By: Markus Daft M.D.   On: 01/23/2018 12:41    ASSESSMENT & PLAN:   Abnormal weight loss # Weight loss-unclear etiology stable over the last few months.  Extensive work-up including CT scan chest and pelvis negative for any obvious malignancy.  Bone marrow biopsy also negative for any obvious malignancy.  Patient is okay to proceed with surgery from oncology standpoint.  #Chronic back pain-awaiting surgery with Dr. Julaine Fusi at [facility] neurosurgery.   #History of osteoporosis and anemia.  # skin rash- itch- anti-shitamine/ hydroctisone 10%; s/p dermatologist [Dr.Kowalski].  # follow up as needed.  All questions were answered. The patient knows to call the clinic with any problems, questions or concerns.  # 40 minutes face-to-face with the patient discussing the above plan of care; more than 50% of time spent on prognosis/ natural history; counseling and coordination.     Cammie Sickle, MD 02/09/2018 7:25 PM

## 2018-02-06 NOTE — Assessment & Plan Note (Addendum)
#  Weight loss-unclear etiology stable over the last few months.  Extensive work-up including CT scan chest and pelvis negative for any obvious malignancy.  Bone marrow biopsy also negative for any obvious malignancy.  Patient is okay to proceed with surgery from oncology standpoint.  #Chronic back pain-awaiting surgery with Dr. Julaine Fusi at [facility] neurosurgery.   #History of osteoporosis and anemia.  # skin rash- itch- anti-shitamine/ hydroctisone 10%; s/p dermatologist [Dr.Kowalski].  # follow up as needed.

## 2018-02-07 ENCOUNTER — Other Ambulatory Visit (HOSPITAL_COMMUNITY): Payer: Self-pay | Admitting: Neurosurgery

## 2018-02-07 DIAGNOSIS — M4150 Other secondary scoliosis, site unspecified: Secondary | ICD-10-CM

## 2018-02-08 ENCOUNTER — Other Ambulatory Visit: Payer: Self-pay | Admitting: Family Medicine

## 2018-02-13 ENCOUNTER — Other Ambulatory Visit: Payer: Self-pay | Admitting: Family Medicine

## 2018-02-15 ENCOUNTER — Inpatient Hospital Stay: Payer: Medicare Other | Admitting: Internal Medicine

## 2018-02-15 ENCOUNTER — Inpatient Hospital Stay: Payer: Medicare Other

## 2018-02-16 ENCOUNTER — Other Ambulatory Visit: Payer: Self-pay | Admitting: Family Medicine

## 2018-02-18 ENCOUNTER — Other Ambulatory Visit: Payer: Self-pay | Admitting: Family Medicine

## 2018-02-18 DIAGNOSIS — I10 Essential (primary) hypertension: Secondary | ICD-10-CM

## 2018-02-23 ENCOUNTER — Ambulatory Visit
Admission: RE | Admit: 2018-02-23 | Discharge: 2018-02-23 | Disposition: A | Discharge status: Home / Self Care | Payer: Medicare Other | Source: Ambulatory Visit | Attending: Neurosurgery | Admitting: Neurosurgery

## 2018-02-23 DIAGNOSIS — M4150 Other secondary scoliosis, site unspecified: Secondary | ICD-10-CM | POA: Diagnosis not present

## 2018-02-23 DIAGNOSIS — M47896 Other spondylosis, lumbar region: Secondary | ICD-10-CM | POA: Diagnosis not present

## 2018-03-07 LAB — HM DIABETES EYE EXAM

## 2018-03-09 ENCOUNTER — Encounter: Payer: Self-pay | Admitting: Family Medicine

## 2018-03-10 ENCOUNTER — Other Ambulatory Visit: Payer: Self-pay | Admitting: Family Medicine

## 2018-03-10 DIAGNOSIS — E119 Type 2 diabetes mellitus without complications: Secondary | ICD-10-CM

## 2018-03-11 ENCOUNTER — Other Ambulatory Visit: Payer: Self-pay | Admitting: Family Medicine

## 2018-03-14 ENCOUNTER — Other Ambulatory Visit: Payer: Self-pay | Admitting: Family Medicine

## 2018-03-15 ENCOUNTER — Telehealth: Payer: Self-pay | Admitting: *Deleted

## 2018-03-15 NOTE — Telephone Encounter (Signed)
Copied from [place] [id number]. Topic: Quick Communication - Office Called Patient >> Mar 14, 2018 11:46 AM Neva Seat wrote: Pt's wife returned missed call.  Please call mobile number if pt is needed.

## 2018-03-16 ENCOUNTER — Other Ambulatory Visit: Payer: Self-pay | Admitting: Neurosurgery

## 2018-03-16 NOTE — Telephone Encounter (Signed)
No mam', I did not. Could have been an Emmi call of some sort.

## 2018-03-16 NOTE — Telephone Encounter (Signed)
Unknown of caller, did you call this patient?

## 2018-03-18 ENCOUNTER — Other Ambulatory Visit: Payer: Self-pay | Admitting: Family Medicine

## 2018-03-18 DIAGNOSIS — R600 Localized edema: Secondary | ICD-10-CM

## 2018-03-19 ENCOUNTER — Other Ambulatory Visit: Payer: Self-pay | Admitting: Family Medicine

## 2018-03-31 ENCOUNTER — Ambulatory Visit (INDEPENDENT_AMBULATORY_CARE_PROVIDER_SITE_OTHER): Payer: Medicare Other

## 2018-03-31 VITALS — BP 124/70 | HR 82 | Temp 98.5°F | Resp 16 | Ht 66.5 in | Wt 185.4 lb

## 2018-03-31 DIAGNOSIS — Z0000 Encounter for general adult medical examination without abnormal findings: Secondary | ICD-10-CM

## 2018-03-31 NOTE — Patient Instructions (Addendum)
  Justin Patel , Thank you for taking time to come for your Medicare Wellness Visit. I appreciate your ongoing commitment to your health goals. Please review the following plan we discussed and let me know if I can assist you in the future.   These are the goals we discussed: Goals      Patient Stated   . Maintain Healthy Lifestyle (pt-stated)     Walk for exercise as tolerated Follow post surgery instructions as directed Healthy diet        This is a list of the screening recommended for you and due dates:  Health Maintenance  Topic Date Due  . Complete foot exam   12/28/2017  . Flu Shot  03/09/2018  . Hemoglobin A1C  07/27/2018  . Colon Cancer Screening  12/28/2018  . Eye exam for diabetics  03/08/2019  . Tetanus Vaccine  06/03/2024  .  Hepatitis C: One time screening is recommended by Center for Disease Control  (CDC) for  adults born from [birth year] through 1965.   Completed  . Pneumonia vaccines  Completed

## 2018-03-31 NOTE — Progress Notes (Addendum)
Subjective:   Justin Patel is a 67 y.o. male who presents for Medicare Annual/Subsequent preventive examination.  Review of Systems:  No ROS.  Medicare Wellness Visit. Additional risk factors are reflected in the social history.  Cardiac Risk Factors include: advanced age (>59mn, >>70women);male gender;diabetes mellitus;hypertension;obesity (BMI >30kg/m2)     Objective:    Vitals: BP 124/70 (BP Location: Right Arm, Patient Position: Sitting, Cuff Size: Normal)   Pulse 82   Temp 98.5 F (36.9 C) (Oral)   Resp 16   Ht 5' 6.5" (1.689 m)   Wt 185 lb 6.4 oz (84.1 kg)   SpO2 94%   BMI 29.48 kg/m   Body mass index is 29.48 kg/m.  Advanced Directives 03/31/2018 02/06/2018 01/23/2018 12/27/2017 12/09/2017 03/30/2017 01/05/2017  Does Patient Have a Medical Advance Directive? Yes No No Yes No No Yes  Type of Advance Directive HMead Does patient want to make changes to medical advance directive? No - Patient declined - - - - - No - Patient declined  Copy of [place] Chart? No - copy requested - - - - - No - copy requested  Would patient like information on creating a medical advance directive? - No - Patient declined - No - Patient declined No - Patient declined No - Patient declined -  Pre-existing out of facility DNR order (yellow form or pink MOST form) - - - - - - -    Tobacco Social History   Tobacco Use  Smoking Status Never Smoker  Smokeless Tobacco Never Used     Counseling given: Not Answered   Clinical Intake:  Pre-visit preparation completed: Yes  Pain : 0-10 Pain Score: 7  Pain Type: Chronic pain Pain Location: Back Pain Orientation: Lower Pain Radiating Towards: Both legs Pain Descriptors / Indicators: Aching, Spasm, Dull Pain Relieving Factors: Pain managed by Dr. HMaryjean KaEffect of Pain on Daily Activities: He paces himself  Pain  Relieving Factors: Pain managed by Dr. HMaryjean Ka Nutritional Status: BMI > 30  Obese Diabetes: Yes(Followed by pcp)  How often do you need to have someone help you when you read instructions, pamphlets, or other written materials from your doctor or pharmacy?: 1 - Never  Interpreter Needed?: No     Past Medical History:  Diagnosis Date  . 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 Anxiety   . Arthritis   . 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  . CHF (congestive heart failure) ([place]    no longer after weight loss  . Chronic kidney disease    cysts  . Degenerative disc disease   . Diabetes mellitus    no longer diabetic-or on meds  . Family history of adverse reaction to anesthesia    sons wake up combative  . Gastric ulcer   . GERD (gastroesophageal reflux disease)   . H/O hiatal hernia   . Headache(784.0)    sinus  . Heart murmur   . Hx of congestive heart failure   . Hyperlipidemia   . Hypertension   . Iron deficiency anemia 02/28/2015  . Sacral fracture, closed ([place]   . Seizures ([place]    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, after GI bleed  . Sleep apnea    hx not now since wt loss  .  Stones in the urinary tract   . Syncope and collapse    Past Surgical History:  Procedure Laterality Date  . ANTERIOR CERVICAL DECOMP/DISCECTOMY FUSION  01/26/2012   Procedure: ANTERIOR CERVICAL DECOMPRESSION/DISCECTOMY FUSION 3 LEVELS;  Surgeon: Floyce Stakes, MD;  Location: [facility];  Service: Neurosurgery;  Laterality: N/A;  Cervical three-four Cervical four-five Cervical five-six Cervical six-seven , Anterior cervical decompression/diskectomy, fusion, plate  . APPENDECTOMY    . [place], normal  . CARDIAC CATHETERIZATION     [place]  . COLONOSCOPY WITH PROPOFOL N/A 12/27/2017   Procedure: COLONOSCOPY WITH PROPOFOL;  Surgeon: Toledo, Benay Pike, MD;   Location: [facility] END[facility]COPY;  Service: Gastroenterology;  Laterality: N/A;  . ESOPHAGOGASTRODUODENOSCOPY (EGD) WITH PROPOFOL N/A 12/27/2017   Procedure: ESOPHAGOGASTRODUODENOSCOPY (EGD) WITH PROPOFOL;  Surgeon: Toledo, Benay Pike, MD;  Location: [facility] ENDOSCOPY;  Service: Gastroenterology;  Laterality: N/A;  . GASTRIC BYPASS  2010   [place]  . HERNIA REPAIR  2000   hiatal  . JOINT REPLACEMENT    . KNEE ARTHROSCOPY     bilateral, left x 2  . NASAL SINUS SURGERY     x5  . OSTEOTOMY  2001   left  . TONSILLECTOMY    . TOTAL KNEE ARTHROPLASTY Left 05/2014   Dr. Marry Guan  . UVULOPALATOPLASTY  2011   Family History  Problem Relation Age of Onset  . Dementia Mother 38  . Heart disease Father 9  . Diabetes Father   . Lymphoma Sister        lymphoma, stage 4  . Ovarian cancer Sister 67       Ovarian  . Lupus Sister   . Prostate cancer Maternal Uncle   . Skin cancer Maternal Uncle   . Tongue cancer Maternal Uncle        uncle died of heart attack  . Alcoholism Maternal Uncle   . Lung cancer Paternal Aunt        pat aunts x 4 died of lung cancer  . Lung cancer Paternal Aunt   . Lung cancer Paternal Aunt   . Lung cancer Paternal Aunt   . Mesothelioma Cousin        paternal cousin   Social History   Socioeconomic History  . Marital status: Married    Spouse name: Not on file  . Number of children: Not on file  . Years of education: Not on file  . Highest education level: Not on file  Occupational History  . Not on file  Social Needs  . Financial resource strain: Not hard at all  . Food insecurity:    Worry: Never true    Inability: Never true  . Transportation needs:    Medical: No    Non-medical: No  Tobacco Use  . Smoking status: Never Smoker  . Smokeless tobacco: Never Used  Substance and Sexual Activity  . Alcohol use: No  . Drug use: No  . Sexual activity: Yes    Partners: Female  Lifestyle  . Physical activity:    Days per week: Not on file    Minutes  per session: Not on file  . Stress: Not on file  Relationships  . Social connections:    Talks on phone: Not on file    Gets together: Not on file    Attends religious service: Not on file    Active member of club or organization: Not on file    Attends meetings of  clubs or organizations: Not on file    Relationship status: Not on file  Other Topics Concern  . Not on file  Social History Narrative   Lives in [place] with wife, [place]. 2 sons, William Hamburger, Lennette Bihari 41.. 4 grandchildren      Work - Retired, previously taught music in [place] - regular diet, limited quantities after gastric bypass      Exercise - no regular, limited by arthritis in knees, occasional water aerobics    Outpatient Encounter Medications as of 03/31/2018  Medication Sig  . acetaminophen (TYLENOL) 500 MG tablet Take 1,000 mg by mouth every 6 (six) hours as needed.  . blood glucose meter kit and supplies KIT Dispense based on patient and insurance preference. Check once daily. ICD doing E11.9.  . denosumab (PROLIA) 60 MG/ML SOSY injection Inject 60 mg into the skin every 6 (six) months.  . DULoxetine (CYMBALTA) 60 MG capsule TAKE ONE CAPSULE BY MOUTH DAILY  . esomeprazole (NEXIUM) 40 MG capsule TAKE 1 CAPSULE(40 MG) BY MOUTH DAILY  . furosemide (LASIX) 20 MG tablet TAKE 1 TABLET BY MOUTH ONCE DAILY  . HYDROcodone-acetaminophen (NORCO) 7.5-325 MG tablet take 1 tablet by mouth every 6 hours if needed for pain  . JARDIANCE 25 MG TABS tablet TAKE 1 TABLET BY MOUTH ONCE DAILY  . losartan (COZAAR) 100 MG tablet TAKE 1 TABLET BY MOUTH DAILY  . metoprolol succinate (TOPROL-XL) 50 MG 24 hr tablet TAKE 1 TABLET BY MOUTH WITH OR IMMEDIATLY FOLLOWING A MEAL  . nystatin cream (MYCOSTATIN) Apply 1 application topically 2 (two) times daily.  . rosuvastatin (CRESTOR) 20 MG tablet Take 1 tablet (20 mg total) by mouth daily.  . sucralfate (CARAFATE) 1 g tablet TAKE 1 TABLET BY MOUTH FOUR TIMES DAILY    . [DISCONTINUED] Vitamin D, Ergocalciferol, (DRISDOL) 50000 units CAPS capsule Take 1 capsule (50,000 Units total) by mouth every 7 (seven) days.   No facility-administered encounter medications on file as of 03/31/2018.     Activities of Daily Living In your present state of health, do you have any difficulty performing the following activities: 03/31/2018  Hearing? N  Vision? N  Difficulty concentrating or making decisions? N  Walking or climbing stairs? Y  Comment Ambulates with cane as needed  Dressing or bathing? N  Doing errands, shopping? N  Preparing Food and eating ? N  Using the Toilet? N  In the past six months, have you accidently leaked urine? N  Do you have problems with loss of bowel control? N  Managing your Medications? N  Managing your Finances? N  Housekeeping or managing your Housekeeping? N  Some recent data might be hidden    Patient Care Team: [name], MD as PCP - General (Family Medicine)   Assessment:   This is a routine wellness examination for [place].  The goal of the wellness visit is to assist the patient how to close the gaps in care and create a preventative care plan for the patient.   The roster of all physicians providing medical care to patient is listed in the Snapshot section of the chart.  Osteoporosis reviewed.    Safety issues reviewed; Smoke and carbon monoxide detectors in the home. No firearms in the home. Wears seatbelts when driving or riding with others. No violence in the home.  They do not have excessive sun exposure.  Discussed the need for sun protection: hats, long sleeves and the  use of sunscreen if there is significant sun exposure.  Patient is alert, normal appearance, oriented to person/place/and time.  Correctly identified the president of the [place] and recalls of 3/3 words. Performs simple calculations and can read correct time from watch face.  Displays appropriate judgement.  No new identified risk were  noted.  No failures at ADL's or IADL's.  Ambulates with cane as needed.   BMI- discussed the importance of a healthy diet, water intake and the benefits of aerobic exercise. Educational material provided.   24 hour diet recall: Regular diet. Limited quantities. Appetite is good.   Dental- UTD.  Sleep patterns- Sleeps without issues through the night.   Low back surgery scheduled September 17  Daily blood sugar checked daily, FBS 90, after meals 150.   Patient Concerns: None at this time. Follow up with PCP as needed.  Exercise Activities and Dietary recommendations Current Exercise Habits: The patient does not participate in regular exercise at present  Goals      Patient Stated   . Maintain Healthy Lifestyle (pt-stated)     Walk for exercise as tolerated Follow post surgery instructions as directed Healthy diet        Fall Risk Fall Risk  03/31/2018 03/30/2017 01/14/2017 10/07/2016 06/07/2016  Falls in the past year? No No No No Yes  Comment - - - - -  Number falls in past yr: - - - - 1  Injury with Fall? - - - - Yes  Comment - - - - -  Risk Factor Category  - - - - -  Risk for fall due to : - - - - -  Follow up - - - - Falls evaluation completed  Comment - - - - -   Depression Screen PHQ 2/9 Scores 03/31/2018 03/30/2017 04/14/2016 02/18/2016  PHQ - 2 Score 0 0 0 0  Exception Documentation - - - -    Cognitive Function MMSE - Mini Mental State Exam 03/31/2018 03/30/2017  Orientation to time 5 5  Orientation to Place 5 5  Registration 3 3  Attention/ Calculation 5 5  Recall 3 3  Language- name 2 objects 2 2  Language- repeat 1 1  Language- follow 3 step command 3 3  Language- read & follow direction 1 1  Write a sentence 1 1  Copy design 1 1  Total score 30 30        Immunization History  Administered Date(s) Administered  . Influenza, High Dose Seasonal PF 06/25/2016  . Influenza,inj,Quad PF,6+ Mos 07/10/2013, 04/05/2017  . Influenza-Unspecified  05/21/2012, 05/29/2014, 04/10/2015, 04/06/2017  . Pneumococcal Conjugate-13 06/13/2014  . Pneumococcal Polysaccharide-23 03/21/2010, 04/05/2017  . Tdap 06/03/2014  . Zoster 05/10/2013   Screening Tests Health Maintenance  Topic Date Due  . FOOT EXAM  12/28/2017  . INFLUENZA VACCINE  03/09/2018  . HEMOGLOBIN A1C  07/27/2018  . COLONOSCOPY  12/28/2018  . OPHTHALMOLOGY EXAM  03/08/2019  . TETANUS/TDAP  06/03/2024  . Hepatitis C Screening  Completed  . PNA vac Low Risk Adult  Completed     Plan:    End of life planning; Advance aging; Advanced directives discussed. Copy of current HCPOA/Living Will requested.    I have personally reviewed and noted the following in the patient's chart:   . Medical and social history . Use of alcohol, tobacco or illicit drugs  . Current medications and supplements . Functional ability and status . Nutritional status . Physical activity . Advanced directives . List  of other physicians . Hospitalizations, surgeries, and ER visits in previous 12 months . Vitals . Screenings to include cognitive, depression, and falls . Referrals and appointments  In addition, I have reviewed and discussed with patient certain preventive protocols, quality metrics, and best practice recommendations. A written personalized care plan for preventive services as well as general preventive health recommendations were provided to patient.     OBrien-Blaney, Ammaar Encina L, LPN  4/32/7614    I have reviewed the above information and agree with above.   Deborra Medina, MD

## 2018-04-07 ENCOUNTER — Other Ambulatory Visit: Payer: Self-pay | Admitting: Family Medicine

## 2018-04-07 DIAGNOSIS — E119 Type 2 diabetes mellitus without complications: Secondary | ICD-10-CM

## 2018-04-10 ENCOUNTER — Other Ambulatory Visit: Payer: Self-pay | Admitting: Family Medicine

## 2018-04-14 NOTE — Pre-Procedure Instructions (Signed)
IRBY FAILS  04/14/2018      [id number] Lorina Rabon, [place] [address] [place] [address] Phone: [phone] Fax: [phone]    Your procedure is scheduled on April 25, 2018.  Report to [facility] Admitting at 530 AM.  Call this number if you have problems the morning of surgery:  [phone]   Remember:  Do not eat or drink after midnight.    Take these medicines the morning of surgery with A SIP OF WATER  Metoprolol succinate (Toprol XL) Tylenol-if needed Benedryl-if needed Duloxetine (cymbalta) Esomeprazole (nexium) Hydrocodone-acetaminophen (norco)-if needed for pain  7 days prior to surgery STOP taking any Aspirin(unless otherwise instructed by your surgeon), Aleve, Naproxen, Ibuprofen, Motrin, Advil, Goody's, BC's, all herbal medications, fish oil, and all vitamins  WHAT DO I DO ABOUT MY DIABETES MEDICATION?  Marland Kitchen Do not take oral diabetes medicines (pills) the morning of surgery-jardiance.  Reviewed and Endorsed by [facility] Patient Education Committee, August 2015  How to Manage Your Diabetes Before and After Surgery  Why is it important to control my blood sugar before and after surgery? . Improving blood sugar levels before and after surgery helps healing and can limit problems. . A way of improving blood sugar control is eating a healthy diet by: o  Eating less sugar and carbohydrates o  Increasing activity/exercise o  Talking with your doctor about reaching your blood sugar goals . High blood sugars (greater than 180 mg/dL) can raise your risk of infections and slow your recovery, so you will need to focus on controlling your diabetes during the weeks before surgery. . Make sure that the doctor who takes care of your diabetes knows about your planned surgery including the date and location.  How do I manage my blood sugar before  surgery? . Check your blood sugar at least 4 times a day, starting 2 days before surgery, to make sure that the level is not too high or low. o Check your blood sugar the morning of your surgery when you wake up and every 2 hours until you get to the Short Stay unit. . If your blood sugar is less than 70 mg/dL, you will need to treat for low blood sugar: o Do not take insulin. o Treat a low blood sugar (less than 70 mg/dL) with  cup of clear juice (cranberry or apple), 4 glucose tablets, OR glucose gel. Recheck blood sugar in 15 minutes after treatment (to make sure it is greater than 70 mg/dL). If your blood sugar is not greater than 70 mg/dL on recheck, call [phone] o  for further instructions. . Report your blood sugar to the short stay nurse when you get to Short Stay.  . If you are admitted to the hospital after surgery: o Your blood sugar will be checked by the staff and you will probably be given insulin after surgery (instead of oral diabetes medicines) to make sure you have good blood sugar levels. o The goal for blood sugar control after surgery is 80-180 mg/dL.  Contacts, dentures or bridgework may not be worn into surgery.  Leave your suitcase in the car.  After surgery it may be brought to your room.  For patients admitted to the hospital, discharge time will be determined by your treatment team.  Patients discharged the day of surgery will not be allowed to drive home.  [facility]- Preparing For Surgery  Before surgery, you can play an important role. Because skin is not sterile, your skin needs to be as free of germs as possible. You can reduce the number of germs on your skin by washing with CHG (chlorahexidine gluconate) Soap before surgery.  CHG is an antiseptic cleaner which kills germs and bonds with the skin to continue killing germs even after washing.    Oral Hygiene is also important to reduce your risk of infection.  Remember - BRUSH YOUR TEETH THE MORNING OF  SURGERY WITH YOUR REGULAR TOOTHPASTE  Please do not use if you have an allergy to CHG or antibacterial soaps. If your skin becomes reddened/irritated stop using the CHG.  Do not shave (including legs and underarms) for at least 48 hours prior to first CHG shower. It is OK to shave your face.  Please follow these instructions carefully.   1. Shower the NIGHT BEFORE SURGERY and the MORNING OF SURGERY with CHG.   2. If you chose to wash your hair, wash your hair first as usual with your normal shampoo.  3. After you shampoo, rinse your hair and body thoroughly to remove the shampoo.  4. Use CHG as you would any other liquid soap. You can apply CHG directly to the skin and wash gently with a scrungie or a clean washcloth.   5. Apply the CHG Soap to your body ONLY FROM THE NECK DOWN.  Do not use on open wounds or open sores. Avoid contact with your eyes, ears, mouth and genitals (private parts). Wash Face and genitals (private parts)  with your normal soap.  6. Wash thoroughly, paying special attention to the area where your surgery will be performed.  7. Thoroughly rinse your body with warm water from the neck down.  8. DO NOT shower/wash with your normal soap after using and rinsing off the CHG Soap.  9. Pat yourself dry with a CLEAN TOWEL.  10. Wear CLEAN PAJAMAS to bed the night before surgery, wear comfortable clothes the morning of surgery  11. Place CLEAN SHEETS on your bed the night of your first shower and DO NOT SLEEP WITH PETS.  Day of Surgery:  Do not apply any deodorants/lotions.  Please wear clean clothes to the hospital/surgery center.   Remember to brush your teeth WITH YOUR REGULAR TOOTHPASTE.   Do not wear jewelry  Do not wear lotions, powders, or colognes, or deodorant.  Men may shave face and neck.  Do not bring valuables to the hospital.   [facility] is not responsible for any belongings or valuables.  Please read over the fact sheets that you were  given.

## 2018-04-15 ENCOUNTER — Other Ambulatory Visit: Payer: Self-pay | Admitting: Family Medicine

## 2018-04-17 ENCOUNTER — Encounter (HOSPITAL_COMMUNITY)
Admission: RE | Admit: 2018-04-17 | Discharge: 2018-04-17 | Disposition: A | Discharge status: Home / Self Care | Payer: Medicare Other | Source: Ambulatory Visit | Attending: Family Medicine | Admitting: Family Medicine

## 2018-04-17 ENCOUNTER — Encounter (HOSPITAL_COMMUNITY): Payer: Self-pay

## 2018-04-17 ENCOUNTER — Other Ambulatory Visit: Payer: Self-pay | Admitting: Family Medicine

## 2018-04-17 DIAGNOSIS — M4316 Spondylolisthesis, lumbar region: Secondary | ICD-10-CM | POA: Diagnosis not present

## 2018-04-17 DIAGNOSIS — Z01812 Encounter for preprocedural laboratory examination: Secondary | ICD-10-CM | POA: Diagnosis present

## 2018-04-17 DIAGNOSIS — R9431 Abnormal electrocardiogram [ECG] [EKG]: Secondary | ICD-10-CM | POA: Insufficient documentation

## 2018-04-17 DIAGNOSIS — I10 Essential (primary) hypertension: Secondary | ICD-10-CM | POA: Diagnosis not present

## 2018-04-17 DIAGNOSIS — R600 Localized edema: Secondary | ICD-10-CM

## 2018-04-17 LAB — TYPE AND SCREEN
ABO/RH(D): AB POS
Antibody Screen: NEGATIVE

## 2018-04-17 LAB — BASIC METABOLIC PANEL
Anion gap: 12 (ref 5–15)
BUN: 29 mg/dL — AB (ref 8–23)
CALCIUM: 9.2 mg/dL (ref 8.9–10.3)
CHLORIDE: 105 mmol/L (ref 98–111)
CO2: 25 mmol/L (ref 22–32)
CREATININE: 1.36 mg/dL — AB (ref 0.61–1.24)
GFR calc Af Amer: >60 mL/min (ref >60)
GFR, EST NON AFRICAN AMERICAN: 52 mL/min — AB (ref >60)
Glucose, Bld: 117 mg/dL — ABNORMAL HIGH (ref 70–99)
Potassium: 4.6 mmol/L (ref 3.5–5.1)
SODIUM: 142 mmol/L (ref 135–145)

## 2018-04-17 LAB — SURGICAL PCR SCREEN
MRSA, PCR: NEGATIVE
Staphylococcus aureus: POSITIVE — AB

## 2018-04-17 LAB — CBC
HCT: 50.6 % (ref 39.0–52.0)
Hemoglobin: 16 g/dL (ref 13.0–17.0)
MCH: 33.8 pg (ref 26.0–34.0)
MCHC: 31.6 g/dL (ref 30.0–36.0)
MCV: 107 fL — ABNORMAL HIGH (ref 78.0–100.0)
PLATELETS: 229 10*3/uL (ref 150–400)
RBC: 4.73 MIL/uL (ref 4.22–5.81)
RDW: 13.4 % (ref 11.5–15.5)
WBC: 9.9 10*3/uL (ref 4.0–10.5)

## 2018-04-17 LAB — ABO/RH: ABO/RH(D): AB POS

## 2018-04-17 LAB — GLUCOSE, CAPILLARY: GLUCOSE-CAPILLARY: 160 mg/dL — AB (ref 70–99)

## 2018-04-17 MED ORDER — CHLORHEXIDINE GLUCONATE CLOTH 2 % EX PADS: 6.0000 | MEDICATED_PAD | Freq: Once | CUTANEOUS | Status: DC

## 2018-04-17 NOTE — Progress Notes (Addendum)
Anesthesia Chart Review:  Case:  [id number] Date/Time:  04/25/18 0715   Procedures:      Right Lumbar 2-3 Lumbar 3-4 Lumbar 4-5 with percutaneous pedicle screws (Right ) - Right Lumbar 2-3 Lumbar 3-4 Lumbar 4-5 with percutaneous pedicle screws     LUMBAR PERCUTANEOUS PEDICLE SCREW 3 LEVEL (N/A )   Anesthesia type:  General   Pre-op diagnosis:  Spondylolisthesis, Lumbar region   Location:  [facility] OR ROOM 20 / [facility]   Surgeon:  Erline Levine, MD      DISCUSSION: Patient is a 67 year old male scheduled for the above procedure.   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  - He has been evaluated by cardiology (Dr. Fletcher Anon) and neurology (Dr. Tomi Likens) for syncope verus possible seizure disorder. 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 He has had recurrent syncopal-type episodes since undergoing gastric bypass with at least 3 EEGs since 2012 that were read as normal (although Dr. Tomi Likens added, "He had a 24 hour ambulatory EEG, which was normal but also did not capture one of his habitual spells." Dr. Tyrell Antonio notes indicate that there was some orthostatic dizziness that improved with medication changes and recommended PRN cardiology follow-up. Neurology tried anti-seizure medication (could not tolerate Keppra or Lamictal), with last prescription seen for zonisamide on 02/12/17 which patient is not longer taking. Last neurology visit was 01/14/17 and patient was not having any recurrent spells at that time.    - By 01/25/18 office note by Dr. 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  Unable to tolerate treatment.  He will monitor for signs of active symptoms."  Discussed known information with anesthesiologist Candida Peeling, MD. Will follow-up with infection prevention regarding latent TB history (intolerant to treatment; last CXR 11/2017) and with patient to see how long he has been off anti-seizure medication and if any recurrent events. If recurrent events then would want neurology input.     ADDENDUM 04/28/18 4:24 PM:  I spoke with patient earlier today about his neurology and latent TB histories. He reports known positive TB screening since childhood. His mother had a history of TB. His PCP tested him on 11/02/17 due to reports of weight loss, but no respiratory symptoms. His Quantiferon-TB Gold Plus test was positive and he was referred to the [facility]. Patient attempted treatment 11/2017, but nausea was too severe and it was stopped. He CXR did not show concerning findings, so he is being monitored for any active symptoms. His weight loss has been ongoing since at least ~ 10/2017 (known when he was evaluated at the health department) and has included a recent hematology evaluation. He denied fever, chills, hemoptysis, night sweats.   In regards to his neurological history, he reports 2012 transient global amnesia event was in the setting of severe stress while his wife was undergoing stem cell transplant and that his syncopal episode in 2015 was felt likely due to orthostatic hypotension when he rose to answer the door and that seizure activity could have been a result of the head trauma he sustained in the fall. He has been on anti-seizure  medication for at least a year now and denied any recurrent syncope, seizure-like activity, or episodes of transient global amnesia. He denied chest pain, SOB, edema. Reports energy is good but activity tolerance is poor due to his back issues which he has been dealing with for well over 6 months.    Based on currently available information and previous input from anesthesiologist, I would anticipate that he can proceed as planned. I did speak with Cheral Marker, [place] [facility]fection Prevention RN who confirmed that a patient with latent TB without symptoms concerning for active TB and up-to-date CXR would not require any other special precautions during his hospital stay. See [place] Policy Preventing TB Disease Management Plan for additional details. (CXR done within the past 6 months.) I have updated Jessica at Dr. Melven Sartorius office.    VS: BP 108/73   Pulse 80   Temp 37 C (Oral)   Ht 5' 6.5" (1.689 m)   Wt 83.5 kg   SpO2 95%   BMI 29.25 kg/m     PROVIDERS: [name], MD is PCP - Metta Clines, DO is neurologist. Last visit 01/14/17 for possible seizure disorder follow-up. Impression is read as "Possible seizure disorder. Recurrent episodes of transient altered awareness. He had two episodes of witnessed seizure, at least one of them unprovoked.  Non-epileptic spells cannot be ruled out." He was kept on Lamictal XR 200 mg daily with 6 month follow-up recommended. (Lamictal changed to zonisamide due on 02/12/17 to rash).  - He is not longer followed by cardiology, but was seen by Kathlyn [place], MD on 04/22/16 for follow-up syncope versus seizure. He had some orthostatic dizziness that improved with decreasing Toprol and discontinuing hydralazine. He had been seen previously for chronic diastolic HF that had not been an issue since undergoing gastric bypass surgery. PRN cardiology follow-up recommended. (He had previously seen Neoma Laming, MD with [facility] in 2011.) - Charlaine Dalton, MD is HEM-ONC. Last visit 02/06/18 for leukocytosis evaluation. By notes, 01/2018 bone marrow biopsy "unremarkable." He wrote, "Patient is okay to proceed with surgery from oncology standpoint." PRN hematology follow-up recommended.    LABS: Labs reviewed: Acceptable for surgery.  Overall Creatinine appears stable.  (all labs ordered are listed, but only abnormal results are displayed)  Labs Reviewed  SURGICAL PCR SCREEN - Abnormal; Notable for the following components:      Result Value   Staphylococcus aureus POSITIVE (*)    All other components within normal limits  CBC - Abnormal; Notable for the following components:   MCV 107.0 (*)    All other components within normal limits  BASIC METABOLIC PANEL - Abnormal; Notable for the following components:   Glucose, Bld 117 (*)    BUN 29 (*)    Creatinine, Ser 1.36 (*)    GFR calc non Af Amer 52 (*)    All other components within normal limits  HEMOGLOBIN A1C - Abnormal; Notable for the following components:   Hgb A1c MFr Bld 7.4 (*)    All other components within normal limits  GLUCOSE, CAPILLARY - Abnormal; Notable for the following components:   Glucose-Capillary 160 (*)    All other components within normal limits  TYPE AND SCREEN  ABO/RH   Lab Results  Component Value Date   CREATININE 1.36 (H) 04/17/2018   CREATININE 1.26 (H) 12/09/2017   CREATININE 1.26 11/02/2017    IMAGES:  1V CXR 11/14/17: IMPRESSION: Chronic bronchitic changes. No acute pneumonia. Evidence of previous granulomatous  infection. Thoracic aortic atherosclerosis.   EKG: 04/17/18: NSR, cannot rule out inferior infarct (age undetermined). He has multiple comparison EKG. I think this tracing is similar to his 04/22/16 tracing.    CV: Carotid U/S 08/20/14: Heterogeneous plaque, bilaterally.  1-39% ICA stenosis, bilaterally.  Normal subclavian arteries, bilaterally.  Patent vertebral arteries with antegrade flow.  Echo 06/04/14: Study Conclusions - Left ventricle: The cavity size was normal. There was severe concentric hypertrophy. Systolic function was normal. The estimated ejection fraction was in the range of 55% to 60%. Wall motion was normal; there were no regional wall motion abnormalities. Doppler parameters are  consistent with abnormal left ventricular relaxation (grade 1 diastolic dysfunction). The E/e&' ratio is between 8-15, suggesting indeterminate LV filling pressure. - Mitral valve: Calcified annulus. There was trivial regurgitation. - Left atrium: Moderately dilated (40 ml/m2). - Right atrium: The atrium was mildly dilated. Impressions: - LVEF 55-60%, severe LVH, normal wall motion, diastolic dysfunction, indeterminate LV filling pressure, moderate LAE.  Nuclear stress test 11/24/09 ([facility] [place]; scanned under Media tab, Correspondence 01/26/12): Impression: Normal study with normal EF, 64%. No evidence of perfusion defects.  According to Dr. Laurelyn Sickle 12/01/09 note (scanned under Media tab, Correspondence 01/26/12), patient reported a normal cardiac cath about 3-4 years prior.    OTHER: - 24 Hour Ambulatory EEG 09/25/14-09/26/17: Impression: This is a normal 24 hour ambulatory EEG of the awake and asleep states, without activating procedures.  A normal study does not rule out the possibility of a seizure disorder in this patient. - EEG 06/05/15:  Clinical Interpretation: This normal EEG is recorded in the waking state. There was no seizure or seizure predisposition recorded on this study.  - EEG 01/22/11 ([place]; for evaluation for transient global amnesia) IMPRESSION: This is a normal awake and asleep EEG.  EGD 12/27/17: Impression:   Normal esophagus. Gastric bypass with a medium-sized pouch and intact staple line. Gastrojejunal anastomosis characterized by healthy appearing mucosa. Biopsied. Normal examined jejunum. Biopsied (no malignancy).  Colonoscopy 12/27/17: Impression:  Diverticulosis in the sigmoid colon.The entire examined colon is normal. Biopsied (no malignancy). Non-bleeding internal hemorrhoids. The examination was otherwise normal.   Past Medical History:  Diagnosis Date  . 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 Anxiety   .  Arthritis   . 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  removal scheduled to clear margins.  . CHF (congestive heart failure) ([place])    no longer after weight loss  . Chronic kidney disease    cysts  . Degenerative disc disease   . Diabetes mellitus    no longer diabetic-or on meds  . Family history of adverse reaction to anesthesia    sons wake up combative  . Gastric ulcer   . GERD (gastroesophageal reflux disease)   . H/O hiatal hernia   . Headache(784.0)    sinus  . Heart murmur   . Hx of congestive heart failure   . Hyperlipidemia   . Hypertension   . Iron deficiency anemia 02/28/2015  . Sacral fracture, closed ([place])   . Seizures ([place])    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, after GI bleed  . Sleep apnea    hx not now since wt loss  . Stones in the urinary tract   . Syncope and collapse     Past Surgical History:  Procedure Laterality Date  . ANTERIOR CERVICAL DECOMP/DISCECTOMY FUSION  01/26/2012   Procedure: ANTERIOR  CERVICAL DECOMPRESSION/DISCECTOMY FUSION 3 LEVELS;  Surgeon: Floyce Stakes, MD;  Location: [facility] NEURO ORS;  Service: Neurosurgery;  Laterality: N/A;  Cervical three-four Cervical four-five Cervical five-six Cervical six-seven , Anterior cervical decompression/diskectomy, fusion, plate  . APPENDECTOMY    . [place], normal  . CARDIAC CATHETERIZATION     [place]  . COLONOSCOPY WITH PROPOFOL N/A 12/27/2017   Procedure: COLONOSCOPY WITH PROPOFOL;  Surgeon: Toledo, Benay Pike, MD;  Location: [facility] ENDOSCOPY;  Service: Gastroenterology;  Laterality: N/A;  . ESOPHAGOGASTRODUODENOSCOPY (EGD) WITH PROPOFOL N/A 12/27/2017   Procedure: ESOPHAGOGASTRODUODENOSCOPY (EGD) WITH PROPOFOL;  Surgeon: Toledo, Benay Pike, MD;  Location: [facility] ENDOSCOPY;  Service: Gastroenterology;  Laterality: N/A;  . GASTRIC BYPASS  2010   [place]  . HERNIA REPAIR  2000   hiatal  . JOINT REPLACEMENT    . KNEE  ARTHROSCOPY     bilateral, left x 2  . NASAL SINUS SURGERY     x5  . OSTEOTOMY  2001   left  . TONSILLECTOMY    . TOTAL KNEE ARTHROPLASTY Left 05/2014   Dr. Marry Guan  . UVULOPALATOPLASTY  2011    MEDICATIONS: . acetaminophen (TYLENOL) 500 MG tablet  . b complex vitamins tablet  . blood glucose meter kit and supplies KIT  . cholecalciferol (VITAMIN D) 1000 units tablet  . denosumab (PROLIA) 60 MG/ML SOSY injection  . diphenhydrAMINE (BENADRYL) 25 MG tablet  . DULoxetine (CYMBALTA) 60 MG capsule  . esomeprazole (NEXIUM) 40 MG capsule  . furosemide (LASIX) 20 MG tablet  . HYDROcodone-acetaminophen (NORCO) 10-325 MG tablet  . JARDIANCE 25 MG TABS tablet  . losartan (COZAAR) 100 MG tablet  . metoprolol succinate (TOPROL-XL) 50 MG 24 hr tablet  . nystatin cream (MYCOSTATIN)  . rosuvastatin (CRESTOR) 20 MG tablet  . sucralfate (CARAFATE) 1 g tablet   . Chlorhexidine Gluconate Cloth 2 % PADS 6 each   And  . Chlorhexidine Gluconate Cloth 2 % PADS 6 each    [facility] Short Stay Center/Anesthesiology Phone [phone] 04/17/2018 4:57 PM

## 2018-04-18 ENCOUNTER — Other Ambulatory Visit: Payer: Self-pay | Admitting: Family Medicine

## 2018-04-18 DIAGNOSIS — E119 Type 2 diabetes mellitus without complications: Secondary | ICD-10-CM

## 2018-04-18 LAB — HEMOGLOBIN A1C
Hgb A1c MFr Bld: 7.4 % — ABNORMAL HIGH (ref 4.8–5.6)
Mean Plasma Glucose: 166 mg/dL

## 2018-04-25 ENCOUNTER — Encounter (HOSPITAL_COMMUNITY)
Admission: RE | Disposition: A | Discharge status: Home / Self Care | Payer: Self-pay | Source: Home / Self Care | Attending: Neurosurgery

## 2018-04-25 ENCOUNTER — Inpatient Hospital Stay (HOSPITAL_COMMUNITY)
Admission: RE | Admit: 2018-04-25 | Discharge: 2018-04-27 | DRG: 460 | Disposition: A | Discharge status: Home / Self Care | Payer: Medicare Other | Attending: Neurosurgery | Admitting: Neurosurgery

## 2018-04-25 ENCOUNTER — Inpatient Hospital Stay (HOSPITAL_COMMUNITY): Payer: Medicare Other | Admitting: Certified Registered Nurse Anesthetist

## 2018-04-25 ENCOUNTER — Encounter (HOSPITAL_COMMUNITY): Payer: Self-pay

## 2018-04-25 ENCOUNTER — Inpatient Hospital Stay (HOSPITAL_COMMUNITY): Payer: Medicare Other

## 2018-04-25 ENCOUNTER — Inpatient Hospital Stay (HOSPITAL_COMMUNITY): Payer: Medicare Other | Admitting: Vascular Surgery

## 2018-04-25 DIAGNOSIS — Z888 Allergy status to other drugs, medicaments and biological substances status: Secondary | ICD-10-CM | POA: Diagnosis not present

## 2018-04-25 DIAGNOSIS — M2578 Osteophyte, vertebrae: Secondary | ICD-10-CM | POA: Diagnosis present

## 2018-04-25 DIAGNOSIS — Z79899 Other long term (current) drug therapy: Secondary | ICD-10-CM

## 2018-04-25 DIAGNOSIS — K219 Gastro-esophageal reflux disease without esophagitis: Secondary | ICD-10-CM | POA: Diagnosis present

## 2018-04-25 DIAGNOSIS — M48061 Spinal stenosis, lumbar region without neurogenic claudication: Secondary | ICD-10-CM | POA: Diagnosis present

## 2018-04-25 DIAGNOSIS — M4316 Spondylolisthesis, lumbar region: Secondary | ICD-10-CM | POA: Diagnosis present

## 2018-04-25 DIAGNOSIS — M5116 Intervertebral disc disorders with radiculopathy, lumbar region: Secondary | ICD-10-CM | POA: Diagnosis present

## 2018-04-25 DIAGNOSIS — M419 Scoliosis, unspecified: Secondary | ICD-10-CM | POA: Diagnosis present

## 2018-04-25 DIAGNOSIS — Z419 Encounter for procedure for purposes other than remedying health state, unspecified: Secondary | ICD-10-CM

## 2018-04-25 HISTORY — PX: ANTERIOR LAT LUMBAR FUSION: SHX1168

## 2018-04-25 HISTORY — PX: LUMBAR PERCUTANEOUS PEDICLE SCREW 3 LEVEL: SHX5562

## 2018-04-25 LAB — GLUCOSE, CAPILLARY
GLUCOSE-CAPILLARY: 192 mg/dL — AB (ref 70–99)
GLUCOSE-CAPILLARY: 77 mg/dL (ref 70–99)
Glucose-Capillary: 179 mg/dL — ABNORMAL HIGH (ref 70–99)
Glucose-Capillary: 221 mg/dL — ABNORMAL HIGH (ref 70–99)

## 2018-04-25 SURGERY — ANTERIOR LATERAL LUMBAR FUSION 3 LEVELS
Anesthesia: General | Laterality: Right

## 2018-04-25 MED ORDER — FENTANYL CITRATE (PF) 100 MCG/2ML IJ SOLN
INTRAMUSCULAR | Status: AC
  Administered 2018-04-25: 50 ug via INTRAVENOUS
  Filled 2018-04-25: qty 2

## 2018-04-25 MED ORDER — FENTANYL CITRATE (PF) 100 MCG/2ML IJ SOLN
25.0000 ug | INTRAMUSCULAR | Status: DC | PRN
  Administered 2018-04-25 (×3): 50 ug via INTRAVENOUS

## 2018-04-25 MED ORDER — PROPOFOL 10 MG/ML IV BOLUS
INTRAVENOUS | Status: DC | PRN
  Administered 2018-04-25: 170 mg via INTRAVENOUS
  Administered 2018-04-25: 60 mg via INTRAVENOUS
  Administered 2018-04-25: 40 mg via INTRAVENOUS

## 2018-04-25 MED ORDER — SODIUM CHLORIDE 0.9% FLUSH: 3.0000 mL | INTRAVENOUS | Status: DC | PRN

## 2018-04-25 MED ORDER — DIPHENHYDRAMINE HCL 25 MG PO CAPS
25.0000 mg | ORAL_CAPSULE | Freq: Every day | ORAL | Status: DC | PRN
  Administered 2018-04-26 (×2): 25 mg via ORAL
  Filled 2018-04-25 (×2): qty 1

## 2018-04-25 MED ORDER — SUGAMMADEX SODIUM 200 MG/2ML IV SOLN
INTRAVENOUS | Status: DC | PRN
  Administered 2018-04-25: 200 mg via INTRAVENOUS

## 2018-04-25 MED ORDER — ONDANSETRON HCL 4 MG/2ML IJ SOLN
INTRAMUSCULAR | Status: AC
  Filled 2018-04-25: qty 2

## 2018-04-25 MED ORDER — LIDOCAINE HCL (CARDIAC) PF 100 MG/5ML IV SOSY
PREFILLED_SYRINGE | INTRAVENOUS | Status: DC | PRN
  Administered 2018-04-25: 80 mg via INTRAVENOUS

## 2018-04-25 MED ORDER — PROPOFOL 10 MG/ML IV BOLUS
INTRAVENOUS | Status: AC
  Filled 2018-04-25: qty 20

## 2018-04-25 MED ORDER — DULOXETINE HCL 30 MG PO CPEP
60.0000 mg | ORAL_CAPSULE | Freq: Every day | ORAL | Status: DC
  Administered 2018-04-26 – 2018-04-27 (×2): 60 mg via ORAL
  Filled 2018-04-25 (×2): qty 2

## 2018-04-25 MED ORDER — FUROSEMIDE 20 MG PO TABS
20.0000 mg | ORAL_TABLET | Freq: Every day | ORAL | Status: DC
  Administered 2018-04-26 – 2018-04-27 (×2): 20 mg via ORAL
  Filled 2018-04-25 (×2): qty 1

## 2018-04-25 MED ORDER — BUPIVACAINE HCL (PF) 0.5 % IJ SOLN
INTRAMUSCULAR | Status: DC | PRN
  Administered 2018-04-25: 20 mL
  Administered 2018-04-25: 6 mL
  Administered 2018-04-25: 27 mL

## 2018-04-25 MED ORDER — INSULIN ASPART 100 UNIT/ML ~~LOC~~ SOLN: 0.0000 [IU] | Freq: Every day | SUBCUTANEOUS | Status: DC

## 2018-04-25 MED ORDER — ONDANSETRON HCL 4 MG/2ML IJ SOLN
INTRAMUSCULAR | Status: DC | PRN
  Administered 2018-04-25: 4 mg via INTRAVENOUS

## 2018-04-25 MED ORDER — METHOCARBAMOL 500 MG PO TABS
500.0000 mg | ORAL_TABLET | Freq: Four times a day (QID) | ORAL | Status: DC | PRN
  Administered 2018-04-25 – 2018-04-26 (×4): 500 mg via ORAL
  Filled 2018-04-25 (×4): qty 1

## 2018-04-25 MED ORDER — SUCRALFATE 1 G PO TABS
1.0000 g | ORAL_TABLET | Freq: Four times a day (QID) | ORAL | Status: DC
  Administered 2018-04-25 – 2018-04-27 (×9): 1 g via ORAL
  Filled 2018-04-25 (×10): qty 1

## 2018-04-25 MED ORDER — LIDOCAINE-EPINEPHRINE 1 %-1:100000 IJ SOLN
INTRAMUSCULAR | Status: AC
  Filled 2018-04-25: qty 1

## 2018-04-25 MED ORDER — ACETAMINOPHEN 325 MG PO TABS
650.0000 mg | ORAL_TABLET | ORAL | Status: DC | PRN
  Administered 2018-04-26 – 2018-04-27 (×4): 650 mg via ORAL
  Filled 2018-04-25 (×4): qty 2

## 2018-04-25 MED ORDER — ALUM & MAG HYDROXIDE-SIMETH 200-200-20 MG/5ML PO SUSP: 30.0000 mL | Freq: Four times a day (QID) | ORAL | Status: DC | PRN

## 2018-04-25 MED ORDER — B COMPLEX PO TABS: 1.0000 | ORAL_TABLET | ORAL | Status: DC

## 2018-04-25 MED ORDER — PHENYLEPHRINE HCL 10 MG/ML IJ SOLN
INTRAMUSCULAR | Status: DC | PRN
  Administered 2018-04-25 (×7): 80 ug via INTRAVENOUS

## 2018-04-25 MED ORDER — ROCURONIUM BROMIDE 50 MG/5ML IV SOSY
PREFILLED_SYRINGE | INTRAVENOUS | Status: AC
  Filled 2018-04-25: qty 5

## 2018-04-25 MED ORDER — LIDOCAINE-EPINEPHRINE 1 %-1:100000 IJ SOLN
INTRAMUSCULAR | Status: DC | PRN
  Administered 2018-04-25: 27 mL
  Administered 2018-04-25: 20 mL
  Administered 2018-04-25: 6 mL

## 2018-04-25 MED ORDER — PROPOFOL 500 MG/50ML IV EMUL
INTRAVENOUS | Status: DC | PRN
  Administered 2018-04-25 (×2): 50 ug/kg/min via INTRAVENOUS

## 2018-04-25 MED ORDER — CEFAZOLIN SODIUM-DEXTROSE 2-4 GM/100ML-% IV SOLN
2.0000 g | INTRAVENOUS | Status: AC
  Administered 2018-04-25: 2 g via INTRAVENOUS
  Filled 2018-04-25: qty 100

## 2018-04-25 MED ORDER — BUPIVACAINE HCL (PF) 0.5 % IJ SOLN
INTRAMUSCULAR | Status: AC
  Filled 2018-04-25: qty 30

## 2018-04-25 MED ORDER — FENTANYL CITRATE (PF) 250 MCG/5ML IJ SOLN
INTRAMUSCULAR | Status: AC
  Filled 2018-04-25: qty 5

## 2018-04-25 MED ORDER — INSULIN ASPART 100 UNIT/ML ~~LOC~~ SOLN
0.0000 [IU] | Freq: Three times a day (TID) | SUBCUTANEOUS | Status: DC
  Administered 2018-04-25: 5 [IU] via SUBCUTANEOUS
  Administered 2018-04-26: 3 [IU] via SUBCUTANEOUS
  Administered 2018-04-26: 2 [IU] via SUBCUTANEOUS

## 2018-04-25 MED ORDER — ROSUVASTATIN CALCIUM 20 MG PO TABS
20.0000 mg | ORAL_TABLET | Freq: Every day | ORAL | Status: DC
  Administered 2018-04-25 – 2018-04-27 (×3): 20 mg via ORAL
  Filled 2018-04-25 (×3): qty 1

## 2018-04-25 MED ORDER — ACETAMINOPHEN 650 MG RE SUPP: 650.0000 mg | RECTAL | Status: DC | PRN

## 2018-04-25 MED ORDER — PANTOPRAZOLE SODIUM 40 MG PO TBEC
40.0000 mg | DELAYED_RELEASE_TABLET | Freq: Every day | ORAL | Status: DC
  Administered 2018-04-26 – 2018-04-27 (×2): 40 mg via ORAL
  Filled 2018-04-25 (×2): qty 1

## 2018-04-25 MED ORDER — EPHEDRINE SULFATE 50 MG/ML IJ SOLN
INTRAMUSCULAR | Status: DC | PRN
  Administered 2018-04-25 (×2): 10 mg via INTRAVENOUS
  Administered 2018-04-25: 15 mg via INTRAVENOUS

## 2018-04-25 MED ORDER — HEMOSTATIC AGENTS (NO CHARGE) OPTIME
TOPICAL | Status: DC | PRN
  Administered 2018-04-25: 1 via TOPICAL

## 2018-04-25 MED ORDER — HYDROCODONE-ACETAMINOPHEN 10-325 MG PO TABS
2.0000 | ORAL_TABLET | Freq: Four times a day (QID) | ORAL | Status: DC | PRN
  Administered 2018-04-25: 2 via ORAL
  Filled 2018-04-25: qty 2

## 2018-04-25 MED ORDER — FLEET ENEMA 7-19 GM/118ML RE ENEM: 1.0000 | ENEMA | Freq: Once | RECTAL | Status: DC | PRN

## 2018-04-25 MED ORDER — ACETAMINOPHEN 500 MG PO TABS
1000.0000 mg | ORAL_TABLET | Freq: Four times a day (QID) | ORAL | Status: DC
  Administered 2018-04-25: 1000 mg via ORAL
  Filled 2018-04-25: qty 2

## 2018-04-25 MED ORDER — MORPHINE SULFATE (PF) 2 MG/ML IV SOLN
2.0000 mg | INTRAVENOUS | Status: DC | PRN
  Administered 2018-04-25 – 2018-04-26 (×3): 2 mg via INTRAVENOUS
  Filled 2018-04-25 (×3): qty 1

## 2018-04-25 MED ORDER — FENTANYL CITRATE (PF) 100 MCG/2ML IJ SOLN
INTRAMUSCULAR | Status: DC | PRN
  Administered 2018-04-25: 50 ug via INTRAVENOUS
  Administered 2018-04-25 (×2): 100 ug via INTRAVENOUS

## 2018-04-25 MED ORDER — NYSTATIN 100000 UNIT/GM EX CREA
1.0000 "application " | TOPICAL_CREAM | Freq: Two times a day (BID) | CUTANEOUS | Status: DC | PRN
  Administered 2018-04-25: 1 via TOPICAL
  Filled 2018-04-25: qty 15

## 2018-04-25 MED ORDER — ALBUMIN HUMAN 5 % IV SOLN
INTRAVENOUS | Status: DC | PRN
  Administered 2018-04-25 (×2): via INTRAVENOUS

## 2018-04-25 MED ORDER — METHOCARBAMOL 500 MG PO TABS
ORAL_TABLET | ORAL | Status: AC
  Filled 2018-04-25: qty 1

## 2018-04-25 MED ORDER — MENTHOL 3 MG MT LOZG: 1.0000 | LOZENGE | OROMUCOSAL | Status: DC | PRN

## 2018-04-25 MED ORDER — BISACODYL 10 MG RE SUPP: 10.0000 mg | Freq: Every day | RECTAL | Status: DC | PRN

## 2018-04-25 MED ORDER — BUPIVACAINE LIPOSOME 1.3 % IJ SUSP
20.0000 mL | INTRAMUSCULAR | Status: AC
  Administered 2018-04-25: 20 mL
  Filled 2018-04-25: qty 20

## 2018-04-25 MED ORDER — ZOLPIDEM TARTRATE 5 MG PO TABS: 5.0000 mg | ORAL_TABLET | Freq: Every evening | ORAL | Status: DC | PRN

## 2018-04-25 MED ORDER — OXYCODONE HCL 5 MG PO TABS
5.0000 mg | ORAL_TABLET | ORAL | Status: DC | PRN
  Administered 2018-04-25: 5 mg via ORAL
  Filled 2018-04-25: qty 1

## 2018-04-25 MED ORDER — MIDAZOLAM HCL 5 MG/5ML IJ SOLN
INTRAMUSCULAR | Status: DC | PRN
  Administered 2018-04-25: 2 mg via INTRAVENOUS

## 2018-04-25 MED ORDER — POLYETHYLENE GLYCOL 3350 17 G PO PACK: 17.0000 g | PACK | Freq: Every day | ORAL | Status: DC | PRN

## 2018-04-25 MED ORDER — PHENOL 1.4 % MT LIQD: 1.0000 | OROMUCOSAL | Status: DC | PRN

## 2018-04-25 MED ORDER — CEFAZOLIN SODIUM-DEXTROSE 2-4 GM/100ML-% IV SOLN
2.0000 g | Freq: Three times a day (TID) | INTRAVENOUS | Status: AC
  Administered 2018-04-25 (×2): 2 g via INTRAVENOUS
  Filled 2018-04-25 (×2): qty 100

## 2018-04-25 MED ORDER — LIDOCAINE 2% (20 MG/ML) 5 ML SYRINGE
INTRAMUSCULAR | Status: AC
  Filled 2018-04-25: qty 5

## 2018-04-25 MED ORDER — KCL IN DEXTROSE-NACL 20-5-0.45 MEQ/L-%-% IV SOLN
INTRAVENOUS | Status: DC
  Administered 2018-04-25: 15:00:00 via INTRAVENOUS

## 2018-04-25 MED ORDER — LACTATED RINGERS IV SOLN
INTRAVENOUS | Status: DC | PRN
  Administered 2018-04-25 (×3): via INTRAVENOUS

## 2018-04-25 MED ORDER — SODIUM CHLORIDE 0.9 % IV SOLN
INTRAVENOUS | Status: DC | PRN
  Administered 2018-04-25: 20 ug/min via INTRAVENOUS

## 2018-04-25 MED ORDER — HYDROCODONE-ACETAMINOPHEN 10-325 MG PO TABS: 1.0000 | ORAL_TABLET | Freq: Four times a day (QID) | ORAL | Status: DC

## 2018-04-25 MED ORDER — THROMBIN 5000 UNITS EX SOLR
CUTANEOUS | Status: AC
  Filled 2018-04-25: qty 5000

## 2018-04-25 MED ORDER — ONDANSETRON HCL 4 MG/2ML IJ SOLN: 4.0000 mg | Freq: Four times a day (QID) | INTRAMUSCULAR | Status: DC | PRN

## 2018-04-25 MED ORDER — INSULIN ASPART 100 UNIT/ML ~~LOC~~ SOLN
4.0000 [IU] | Freq: Three times a day (TID) | SUBCUTANEOUS | Status: DC
  Administered 2018-04-25 – 2018-04-27 (×5): 4 [IU] via SUBCUTANEOUS

## 2018-04-25 MED ORDER — ROCURONIUM BROMIDE 100 MG/10ML IV SOLN
INTRAVENOUS | Status: DC | PRN
  Administered 2018-04-25: 40 mg via INTRAVENOUS

## 2018-04-25 MED ORDER — EPHEDRINE 5 MG/ML INJ
INTRAVENOUS | Status: AC
  Filled 2018-04-25: qty 10

## 2018-04-25 MED ORDER — MIDAZOLAM HCL 2 MG/2ML IJ SOLN
INTRAMUSCULAR | Status: AC
  Filled 2018-04-25: qty 2

## 2018-04-25 MED ORDER — LOSARTAN POTASSIUM 50 MG PO TABS
100.0000 mg | ORAL_TABLET | Freq: Every day | ORAL | Status: DC
  Administered 2018-04-26 – 2018-04-27 (×2): 100 mg via ORAL
  Filled 2018-04-25 (×2): qty 2

## 2018-04-25 MED ORDER — KETAMINE HCL 10 MG/ML IJ SOLN
INTRAMUSCULAR | Status: DC | PRN
  Administered 2018-04-25: 20 mg via INTRAVENOUS
  Administered 2018-04-25 (×3): 10 mg via INTRAVENOUS

## 2018-04-25 MED ORDER — HYDROCODONE-ACETAMINOPHEN 10-325 MG PO TABS: 1.0000 | ORAL_TABLET | Freq: Four times a day (QID) | ORAL | Status: DC | PRN

## 2018-04-25 MED ORDER — 0.9 % SODIUM CHLORIDE (POUR BTL) OPTIME
TOPICAL | Status: DC | PRN
  Administered 2018-04-25: 1000 mL

## 2018-04-25 MED ORDER — METOPROLOL SUCCINATE ER 50 MG PO TB24
50.0000 mg | ORAL_TABLET | Freq: Every day | ORAL | Status: DC
  Administered 2018-04-26 – 2018-04-27 (×2): 50 mg via ORAL
  Filled 2018-04-25 (×2): qty 1

## 2018-04-25 MED ORDER — CANAGLIFLOZIN 100 MG PO TABS
100.0000 mg | ORAL_TABLET | Freq: Every day | ORAL | Status: DC
  Administered 2018-04-26 – 2018-04-27 (×2): 100 mg via ORAL
  Filled 2018-04-25 (×2): qty 1

## 2018-04-25 MED ORDER — ONDANSETRON HCL 4 MG PO TABS
4.0000 mg | ORAL_TABLET | Freq: Four times a day (QID) | ORAL | Status: DC | PRN
  Administered 2018-04-26: 4 mg via ORAL
  Filled 2018-04-25: qty 1

## 2018-04-25 MED ORDER — DOCUSATE SODIUM 100 MG PO CAPS
100.0000 mg | ORAL_CAPSULE | Freq: Two times a day (BID) | ORAL | Status: DC
  Administered 2018-04-25 – 2018-04-27 (×5): 100 mg via ORAL
  Filled 2018-04-25 (×5): qty 1

## 2018-04-25 MED ORDER — SODIUM CHLORIDE 0.9% FLUSH
3.0000 mL | Freq: Two times a day (BID) | INTRAVENOUS | Status: DC
  Administered 2018-04-25 – 2018-04-26 (×3): 3 mL via INTRAVENOUS

## 2018-04-25 MED ORDER — PHENYLEPHRINE 40 MCG/ML (10ML) SYRINGE FOR IV PUSH (FOR BLOOD PRESSURE SUPPORT)
PREFILLED_SYRINGE | INTRAVENOUS | Status: AC
  Filled 2018-04-25: qty 10

## 2018-04-25 MED ORDER — VITAMIN D 1000 UNITS PO TABS: 1000.0000 [IU] | ORAL_TABLET | ORAL | Status: DC

## 2018-04-25 MED ORDER — KETAMINE HCL 50 MG/5ML IJ SOSY
PREFILLED_SYRINGE | INTRAMUSCULAR | Status: AC
  Filled 2018-04-25: qty 5

## 2018-04-25 MED ORDER — HYDROCODONE-ACETAMINOPHEN 10-325 MG PO TABS
2.0000 | ORAL_TABLET | ORAL | Status: DC | PRN
  Administered 2018-04-25 – 2018-04-26 (×2): 2 via ORAL
  Filled 2018-04-25 (×2): qty 2

## 2018-04-25 MED ORDER — METHOCARBAMOL 1000 MG/10ML IJ SOLN
500.0000 mg | Freq: Four times a day (QID) | INTRAVENOUS | Status: DC | PRN
  Filled 2018-04-25: qty 5

## 2018-04-25 SURGICAL SUPPLY — 89 items
ADH SKN CLS APL DERMABOND .7 (GAUZE/BANDAGES/DRESSINGS) ×2
AGENT HMST KT MTR STRL THRMB (HEMOSTASIS) ×1
BLADE CLIPPER SURG (BLADE) IMPLANT
CAGE MODULUS XLW 8X22X50 - 10 (Cage) ×4 IMPLANT
CARTRIDGE OIL MAESTRO DRILL (MISCELLANEOUS) IMPLANT
CLIP NEUROVISION LG (CLIP) ×4 IMPLANT
CONT SPEC 4OZ CLIKSEAL STRL BL (MISCELLANEOUS) ×4 IMPLANT
COVER BACK TABLE 24X17X13 BIG (DRAPES) IMPLANT
COVER BACK TABLE 60X90IN (DRAPES) ×4 IMPLANT
DECANTER SPIKE VIAL GLASS SM (MISCELLANEOUS) ×8 IMPLANT
DERMABOND ADVANCED (GAUZE/BANDAGES/DRESSINGS) ×4
DERMABOND ADVANCED .7 DNX12 (GAUZE/BANDAGES/DRESSINGS) ×4 IMPLANT
DIFFUSER DRILL AIR PNEUMATIC (MISCELLANEOUS) ×8 IMPLANT
DRAPE C-ARM 42X72 X-RAY (DRAPES) ×8 IMPLANT
DRAPE C-ARMOR (DRAPES) ×8 IMPLANT
DRAPE LAPAROTOMY 100X72X124 (DRAPES) ×8 IMPLANT
DRAPE POUCH INSTRU U-SHP 10X18 (DRAPES) ×4 IMPLANT
DRAPE SURG 17X23 STRL (DRAPES) ×4 IMPLANT
DRSG OPSITE POSTOP 3X4 (GAUZE/BANDAGES/DRESSINGS) ×8 IMPLANT
DRSG OPSITE POSTOP 4X6 (GAUZE/BANDAGES/DRESSINGS) ×4 IMPLANT
DRSG OPSITE POSTOP 4X8 (GAUZE/BANDAGES/DRESSINGS) ×8 IMPLANT
DURAPREP 26ML APPLICATOR (WOUND CARE) ×8 IMPLANT
ELECT REM PT RETURN 9FT ADLT (ELECTROSURGICAL) ×8
ELECTRODE REM PT RTRN 9FT ADLT (ELECTROSURGICAL) ×4 IMPLANT
GAUZE 4X4 16PLY RFD (DISPOSABLE) IMPLANT
GAUZE SPONGE 4X4 12PLY STRL (GAUZE/BANDAGES/DRESSINGS) ×4 IMPLANT
GAUZE SPONGE 4X4 16PLY XRAY LF (GAUZE/BANDAGES/DRESSINGS) ×4 IMPLANT
GLOVE BIO SURGEON STRL SZ8 (GLOVE) ×12 IMPLANT
GLOVE BIOGEL PI IND STRL 6 (GLOVE) ×2 IMPLANT
GLOVE BIOGEL PI IND STRL 6.5 (GLOVE) ×2 IMPLANT
GLOVE BIOGEL PI IND STRL 8 (GLOVE) ×4 IMPLANT
GLOVE BIOGEL PI IND STRL 8.5 (GLOVE) ×6 IMPLANT
GLOVE BIOGEL PI INDICATOR 6 (GLOVE) ×2
GLOVE BIOGEL PI INDICATOR 6.5 (GLOVE) ×2
GLOVE BIOGEL PI INDICATOR 8 (GLOVE) ×4
GLOVE BIOGEL PI INDICATOR 8.5 (GLOVE) ×6
GLOVE ECLIPSE 7.5 STRL STRAW (GLOVE) ×12 IMPLANT
GLOVE ECLIPSE 8.0 STRL XLNG CF (GLOVE) ×8 IMPLANT
GLOVE EXAM NITRILE LRG STRL (GLOVE) IMPLANT
GLOVE EXAM NITRILE XL STR (GLOVE) IMPLANT
GLOVE EXAM NITRILE XS STR PU (GLOVE) IMPLANT
GLOVE SURG SS PI 6.5 STRL IVOR (GLOVE) ×8 IMPLANT
GOWN STRL REUS W/ TWL LRG LVL3 (GOWN DISPOSABLE) ×2 IMPLANT
GOWN STRL REUS W/ TWL XL LVL3 (GOWN DISPOSABLE) ×4 IMPLANT
GOWN STRL REUS W/TWL 2XL LVL3 (GOWN DISPOSABLE) ×16 IMPLANT
GOWN STRL REUS W/TWL LRG LVL3 (GOWN DISPOSABLE) ×2
GOWN STRL REUS W/TWL XL LVL3 (GOWN DISPOSABLE) ×4
GUIDEWIRE NITINOL BEVEL TIP (WIRE) ×32 IMPLANT
KIT BASIN OR (CUSTOM PROCEDURE TRAY) ×8 IMPLANT
KIT DILATOR XLIF 5 (KITS) ×3 IMPLANT
KIT INFUSE SMALL (Orthopedic Implant) ×4 IMPLANT
KIT POSITION SURG JACKSON T1 (MISCELLANEOUS) ×4 IMPLANT
KIT SURGICAL ACCESS MAXCESS 4 (KITS) ×4 IMPLANT
KIT TURNOVER KIT B (KITS) ×8 IMPLANT
KIT XLIF (KITS) ×1
MARKER SKIN DUAL TIP RULER LAB (MISCELLANEOUS) ×4 IMPLANT
MODULE NVM5 NEXT GEN EMG (NEEDLE) ×4 IMPLANT
MODULUS XLW 10X22X50MM 10DEG (Spine Construct) ×4 IMPLANT
MODULUS XLW 10X22X55MM 10 (Spine Construct) ×4 IMPLANT
NEEDLE HYPO 21X1.5 SAFETY (NEEDLE) ×4 IMPLANT
NEEDLE HYPO 25X1 1.5 SAFETY (NEEDLE) ×8 IMPLANT
NEEDLE I PASS (NEEDLE) ×4 IMPLANT
NS IRRIG 1000ML POUR BTL (IV SOLUTION) ×4 IMPLANT
OIL CARTRIDGE MAESTRO DRILL (MISCELLANEOUS)
PACK LAMINECTOMY NEURO (CUSTOM PROCEDURE TRAY) ×8 IMPLANT
PAD ARMBOARD 7.5X6 YLW CONV (MISCELLANEOUS) ×12 IMPLANT
PATTIES SURGICAL .5 X.5 (GAUZE/BANDAGES/DRESSINGS) IMPLANT
PATTIES SURGICAL .5 X1 (DISPOSABLE) IMPLANT
PATTIES SURGICAL 1X1 (DISPOSABLE) IMPLANT
PUTTY BONE ATTRAX 10CC STRIP (Putty) ×4 IMPLANT
ROD RELINE MAS LORD 5.5X100MM (Rod) ×4 IMPLANT
ROD RELINE MAS LORD 5.5X95MM (Rod) ×4 IMPLANT
SCREW LOCK RELINE 5.5 TULIP (Screw) ×32 IMPLANT
SCREW MAS RELINE 6.5X45 POLY (Screw) ×12 IMPLANT
SCREW MAS RELINE POLY 6.5X40 (Screw) ×4 IMPLANT
SCREW RELINE MAS POLY 5.5X45MM (Screw) ×16 IMPLANT
SPONGE LAP 4X18 RFD (DISPOSABLE) IMPLANT
STAPLER SKIN PROX WIDE 3.9 (STAPLE) ×4 IMPLANT
SURGIFLO W/THROMBIN 8M KIT (HEMOSTASIS) ×4 IMPLANT
SUT VIC AB 1 CT1 18XBRD ANBCTR (SUTURE) ×6 IMPLANT
SUT VIC AB 1 CT1 8-18 (SUTURE) ×6
SUT VIC AB 2-0 CT1 18 (SUTURE) ×16 IMPLANT
SUT VIC AB 3-0 SH 8-18 (SUTURE) ×16 IMPLANT
SYR INSULIN 1ML 31GX6 SAFETY (SYRINGE) IMPLANT
SYRINGE 20CC LL (MISCELLANEOUS) ×4 IMPLANT
TOWEL GREEN STERILE (TOWEL DISPOSABLE) ×4 IMPLANT
TOWEL GREEN STERILE FF (TOWEL DISPOSABLE) ×4 IMPLANT
TRAY FOLEY MTR SLVR 16FR STAT (SET/KITS/TRAYS/PACK) ×4 IMPLANT
WATER STERILE IRR 1000ML POUR (IV SOLUTION) ×8 IMPLANT

## 2018-04-25 NOTE — Evaluation (Signed)
Physical Therapy Evaluation Patient Details Name: Justin Patel MRN: [id number] DOB: Jan 24, 1951 Today's Date: 04/25/2018   History of Present Illness  patient is a 67 yo male s/p Right Lumbar two-three Lumbar three-four Lumbar four-five anterior  lateral interbody fusion  with posterior percutaneous pedicle screws   Clinical Impression  Patient seen for mobility assessment s/p spinal surgery. Mobilizing well. Educated patient on precautions, mobility expectations, safety and car transfers. No further acute PT needs. Will sign off.     Follow Up Recommendations No PT follow up;Supervision - Intermittent    Equipment Recommendations  None recommended by PT    Recommendations for Other Services       Precautions / Restrictions Precautions Precautions: Back Precaution Booklet Issued: Yes (comment) Precaution Comments: reviewed with patient Required Braces or Orthoses: Spinal Brace Restrictions Weight Bearing Restrictions: No      Mobility  Bed Mobility Overal bed mobility: Modified Independent                Transfers Overall transfer level: Modified independent                  Ambulation/Gait Ambulation/Gait assistance: Supervision Gait Distance (Feet): 410 Feet Assistive device: None Gait Pattern/deviations: Step-through pattern;Decreased stride length Gait velocity: decreased Gait velocity interpretation: <1.8 ft/sec, indicate of risk for recurrent falls General Gait Details: decreased cadence initially, Vcs for increased speed. Supervision for stability  Stairs Stairs: Yes Stairs assistance: Supervision Stair Management: One rail Right Number of Stairs: 4 General stair comments: no difficulty  Wheelchair Mobility    Modified Rankin (Stroke Patients Only)       Balance Overall balance assessment: Mild deficits observed, not formally tested                                           Pertinent Vitals/Pain Pain  Assessment: 0-10 Pain Score: 4  Pain Location: groin Pain Descriptors / Indicators: Sore Pain Intervention(s): Monitored during session    Home Living Family/patient expects to be discharged to:: Private residence Living Arrangements: Spouse/significant other Available Help at Discharge: Family;Available PRN/intermittently Type of Home: House Home Access: Stairs to enter Entrance Stairs-Rails: None Entrance Stairs-Number of Steps: 3 Home Layout: One level Home Equipment: Walker - 2 wheels;Crutches;Shower seat      Prior Function Level of Independence: Independent               Hand Dominance   Dominant Hand: Right    Extremity/Trunk Assessment   Upper Extremity Assessment Upper Extremity Assessment: Overall WFL for tasks assessed    Lower Extremity Assessment Lower Extremity Assessment: Overall WFL for tasks assessed       Communication   Communication: No difficulties  Cognition Arousal/Alertness: Awake/alert Behavior During Therapy: WFL for tasks assessed/performed Overall Cognitive Status: Within Functional Limits for tasks assessed                                        General Comments      Exercises     Assessment/Plan    PT Assessment Patent does not need any further PT services  PT Problem List         PT Treatment Interventions      PT Goals (Current goals can be found in the Care Plan section)  Acute  Rehab PT Goals PT Goal Formulation: All assessment and education complete, DC therapy    Frequency     Barriers to discharge        Co-evaluation               AM-PAC PT "6 Clicks" Daily Activity  Outcome Measure Difficulty turning over in bed (including adjusting bedclothes, sheets and blankets)?: A Little Difficulty moving from lying on back to sitting on the side of the bed? : A Little Difficulty sitting down on and standing up from a chair with arms (e.g., wheelchair, bedside commode, etc,.)?: A  Little Help needed moving to and from a bed to chair (including a wheelchair)?: A Little Help needed walking in hospital room?: A Little Help needed climbing 3-5 steps with a railing? : A Little 6 Click Score: 18    End of Session Equipment Utilized During Treatment: Back brace Activity Tolerance: Patient tolerated treatment well Patient left: in chair;with call bell/phone within reach Nurse Communication: Mobility status PT Visit Diagnosis: Difficulty in walking, not elsewhere classified (R26.2)    Time: 2111-7356 PT Time Calculation (min) (ACUTE ONLY): 19 min   Charges:   PT Evaluation $PT Eval Moderate Complexity: 1 Mod          Alben Deeds, PT DPT  Board Certified Neurologic Specialist Acute Rehabilitation Services Pager [phone] Office [phone]   Justin Patel 04/25/2018, 5:23 PM

## 2018-04-25 NOTE — Interval H&P Note (Signed)
History and Physical Interval Note:  04/25/2018 7:29 AM  Justin Patel  has presented today for surgery, with the diagnosis of Spondylolisthesis, Lumbar region  The various methods of treatment have been discussed with the patient and family. After consideration of risks, benefits and other options for treatment, the patient has consented to  Procedure(s) with comments: Right Lumbar 2-3 Lumbar 3-4 Lumbar 4-5 with percutaneous pedicle screws (Right) - Right Lumbar 2-3 Lumbar 3-4 Lumbar 4-5 with percutaneous pedicle screws LUMBAR PERCUTANEOUS PEDICLE SCREW 3 LEVEL (N/A) as a surgical intervention .  The patient's history has been reviewed, patient examined, no change in status, stable for surgery.  I have reviewed the patient's chart and labs.  Questions were answered to the patient's satisfaction.     Sabryna Lahm D

## 2018-04-25 NOTE — Brief Op Note (Signed)
04/25/2018  12:57 PM  PATIENT:  Justin Patel  67 y.o. male  PRE-OPERATIVE DIAGNOSIS:  Spondylolisthesis, Lumbar region, scoliosis, herniated lumbar disc, scoliosis, foraminal stenosis, lumbago, radiculopathy  POST-OPERATIVE DIAGNOSIS:  Spondylolisthesis, Lumbar region, scoliosis, herniated lumbar disc, scoliosis, foraminal stenosis, lumbago, radiculopathy  PROCEDURE:  Procedure(s): Right Lumbar two-three Lumbar three-four Lumbar four-five anterior  lateral interbody fusion  with posterior percutaneous pedicle screws (Right) LUMBAR PERCUTANEOUS PEDICLE SCREW 3 LEVEL (N/A)  SURGEON:  Surgeon(s) and Role:    Erline Levine, MD - Primary    * Ashok Pall, MD - Assisting  PHYSICIAN ASSISTANT:   ASSISTANTS: [place], RN   ANESTHESIA:   general  EBL:  150 mL   BLOOD ADMINISTERED:none  DRAINS: none   LOCAL MEDICATIONS USED:  MARCAINE    and LIDOCAINE   SPECIMEN:  No Specimen  DISPOSITION OF SPECIMEN:  N/A  COUNTS:  YES  TOURNIQUET:  * No tourniquets in log *  DICTATION: DICTATION: Patient is a 67 year old with severe spondylosis stenosis and scoliosis of the lumbar spine. It was elected to take him to surgery for anterolateral decompression and posterior pedicle screw fixation from a right sided approach at the L 23, L 34, L 45 levels.  Procedure: Patient was brought to the operating room and placed in a left lateral decubitus position on the operative table and using orthogonally projected C-arm fluoroscopy the patient was placed so that the L2-3 L3-4 and L 45 levels were visualized in AP and lateral plane. The patient was then taped into position. The table was flexed so as to expose the L 45 level as the patient has a high iliac crest. Skin was marked along with a posterior finger dissection incision. His flank was then prepped and draped in usual sterile fashion and incisions were made sequentially at L 45,  L3-4 and L2-3 levels. Posterior finger dissection was made to enter  the retroperitoneal space and then subsequently the probe was inserted into the psoas muscle from the right side initially at the L 45 level. After mapping the neural elements were able to dock the probe per the midpoint of this vertebral level and without indications electrically of too close proximity to the neural tissues. Subsequently the self-retaining tractor was.after sequential dilators were utilized the shim was employed and the interspace was cleared of psoas muscle and then incised. A thorough discectomy was performed. Angled instruments were used to clear the interspace of disc material.An anterior entry with posterior trajectory was performed to avoid neural elements.   After thorough discectomy was performed and this was performed using AP and lateral fluoroscopy a 10 lordotic by 55 x 22 mm implant was packed with small BMP and Attrax. This was tamped into position using the slides and its position was confirmed on AP and lateral fluoroscopy. Subsequently exposure was performed at the L3-4 level and similar dissection was performed with locking of the self-retaining retractor. At this level were able to place an 10 lordotic by 22 x 66mm implant packed in a similar fashion. At the L2-3 level were able to place an 8 mm lordotic by 50 x 22 mm implant packed in a similar fashion. Hemostasis was assured the wounds were irrigated and closed with interrupted Vicryl sutures.  Sterile occlusive dressings were placed.  Retractor times were:  L 45: 15:30 minutes;  L 34: 23:39 minutes; L 23: 19:50 minutes.   Patient was then turned into a prone position on the operating table on Purcell Table and using AP  and lateral fluoroscopy throughout this portion of the procedure, pedicle screws were placed using Reline Nuvasive cannulated percutaneous screws. 2 screws were placed at L2 and (5.5 x 45 mm) and 2 at L3 (5.5 x 45), and two at L4 (6.5 x 40 right and 6.5 x 45 left) ,  2 at L5 (6.5 x 45). 100 mm rod was then  affixed to the screw heads do a separate stab incision and locked down on the screws on the left and 95 mm rod on the right. All connections were then torqued and the Towers were disassembled. The wounds were irrigated and then closed with 1, 2-0 and 3-0 Vicryl stitches. Sterile occlusive dressing was placed with Dermabond. Long-acting Marcaine was injected. The patient was then extubated in the operating room and taken to recovery in stable and satisfactory condition having tolerated her operation well. Counts were correct at the end of the case.  Pelvic Parameters:  Preop: PT 7; PI 26; LL-36; PI-LL -10   PLAN OF CARE: Admit to inpatient   PATIENT DISPOSITION:  PACU - hemodynamically stable.   Delay start of Pharmacological VTE agent (>24hrs) due to surgical blood loss or risk of bleeding: yes

## 2018-04-25 NOTE — Progress Notes (Signed)
Awake, alert, conversant.  MAEW with good strength.  Doing well.  Sore in back.

## 2018-04-25 NOTE — Anesthesia Postprocedure Evaluation (Signed)
Anesthesia Post Note  Patient: Justin Patel  Procedure(s) Performed: Right Lumbar two-three Lumbar three-four Lumbar four-five anterior  lateral interbody fusion  with posterior percutaneous pedicle screws (Right ) LUMBAR PERCUTANEOUS PEDICLE SCREW 3 LEVEL (N/A )     Patient location during evaluation: PACU Anesthesia Type: General Level of consciousness: awake Vital Signs Assessment: post-procedure vital signs reviewed and stable Respiratory status: spontaneous breathing Cardiovascular status: stable Postop Assessment: no headache Anesthetic complications: no    Last Vitals:  Vitals:   04/25/18 1340 04/25/18 1403  BP: 110/71 116/65  Pulse: 69 70  Resp: 12 20  Temp: 36.5 C 36.6 C  SpO2: 99% 98%    Last Pain:  Vitals:   04/25/18 1403  TempSrc: Oral  PainSc:                  Romy Ipock

## 2018-04-25 NOTE — Progress Notes (Signed)
Orthopedic Tech Progress Note Patient Details:  Justin Patel 09/16/1950 [id number] Notes stated that patient provided brace. Patient ID: Justin Patel, male   DOB: [birth year], 67 y.o.   MRN: [id number]   Braulio Bosch 04/25/2018, 4:22 PM

## 2018-04-25 NOTE — Transfer of Care (Signed)
Immediate Anesthesia Transfer of Care Note  Patient: Justin Patel  Procedure(s) Performed: Right Lumbar two-three Lumbar three-four Lumbar four-five anterior  lateral interbody fusion  with posterior percutaneous pedicle screws (Right ) LUMBAR PERCUTANEOUS PEDICLE SCREW 3 LEVEL (N/A )  Patient Location: PACU  Anesthesia Type:General  Level of Consciousness: awake, alert  and oriented  Airway & Oxygen Therapy: Patient Spontanous Breathing and Patient connected to nasal cannula oxygen  Post-op Assessment: Report given to RN, Post -op Vital signs reviewed and stable and Patient moving all extremities  Post vital signs: Reviewed and stable  Last Vitals:  Vitals Value Taken Time  BP 98/57 04/25/2018  1:10 PM  Temp 36.5 C 04/25/2018  1:09 PM  Pulse 72 04/25/2018  1:19 PM  Resp 13 04/25/2018  1:19 PM  SpO2 100 % 04/25/2018  1:19 PM  Vitals shown include unvalidated device data.  Last Pain:  Vitals:   04/25/18 1318  TempSrc:   PainSc: 7       Patients Stated Pain Goal: 2 (27/61/47 0929)  Complications: No apparent anesthesia complications

## 2018-04-25 NOTE — Anesthesia Procedure Notes (Addendum)
Procedure Name: Intubation Date/Time: 04/25/2018 7:56 AM Performed by: Annaya Bangert T, CRNA Pre-anesthesia Checklist: Patient identified, Emergency Drugs available, Suction available and Patient being monitored Patient Re-evaluated:Patient Re-evaluated prior to induction Oxygen Delivery Method: Circle system utilized Preoxygenation: Pre-oxygenation with 100% oxygen Induction Type: IV induction Ventilation: Mask ventilation without difficulty Laryngoscope Size: Miller and 3 Grade View: Grade I Tube type: Oral Tube size: 7.5 mm Number of attempts: 1 Airway Equipment and Method: Patient positioned with wedge pillow and Stylet Placement Confirmation: ETT inserted through vocal cords under direct vision,  positive ETCO2 and breath sounds checked- equal and bilateral Secured at: 22 cm Tube secured with: Tape Dental Injury: Teeth and Oropharynx as per pre-operative assessment

## 2018-04-25 NOTE — Op Note (Signed)
04/25/2018  12:57 PM  PATIENT:  Justin Patel  67 y.o. male  PRE-OPERATIVE DIAGNOSIS:  Spondylolisthesis, Lumbar region, scoliosis, herniated lumbar disc, scoliosis, foraminal stenosis, lumbago, radiculopathy  POST-OPERATIVE DIAGNOSIS:  Spondylolisthesis, Lumbar region, scoliosis, herniated lumbar disc, scoliosis, foraminal stenosis, lumbago, radiculopathy  PROCEDURE:  Procedure(s): Right Lumbar two-three Lumbar three-four Lumbar four-five anterior  lateral interbody fusion  with posterior percutaneous pedicle screws (Right) LUMBAR PERCUTANEOUS PEDICLE SCREW 3 LEVEL (N/A)  SURGEON:  Surgeon(s) and Role:    Erline Levine, MD - Primary    * Ashok Pall, MD - Assisting  PHYSICIAN ASSISTANT:   ASSISTANTS: Poteat, RN   ANESTHESIA:   general  EBL:  150 mL   BLOOD ADMINISTERED:none  DRAINS: none   LOCAL MEDICATIONS USED:  MARCAINE    and LIDOCAINE   SPECIMEN:  No Specimen  DISPOSITION OF SPECIMEN:  N/A  COUNTS:  YES  TOURNIQUET:  * No tourniquets in log *  DICTATION: DICTATION: Patient is a 67 year old with severe spondylosis stenosis and scoliosis of the lumbar spine. It was elected to take him to surgery for anterolateral decompression and posterior pedicle screw fixation from a right sided approach at the L 23, L 34, L 45 levels.  Procedure: Patient was brought to the operating room and placed in a left lateral decubitus position on the operative table and using orthogonally projected C-arm fluoroscopy the patient was placed so that the L2-3 L3-4 and L 45 levels were visualized in AP and lateral plane. The patient was then taped into position. The table was flexed so as to expose the L 45 level as the patient has a high iliac crest. Skin was marked along with a posterior finger dissection incision. His flank was then prepped and draped in usual sterile fashion and incisions were made sequentially at L 45,  L3-4 and L2-3 levels. Posterior finger dissection was made to enter  the retroperitoneal space and then subsequently the probe was inserted into the psoas muscle from the right side initially at the L 45 level. After mapping the neural elements were able to dock the probe per the midpoint of this vertebral level and without indications electrically of too close proximity to the neural tissues. Subsequently the self-retaining tractor was.after sequential dilators were utilized the shim was employed and the interspace was cleared of psoas muscle and then incised. A thorough discectomy was performed. Angled instruments were used to clear the interspace of disc material.An anterior entry with posterior trajectory was performed to avoid neural elements.   After thorough discectomy was performed and this was performed using AP and lateral fluoroscopy a 10 lordotic by 55 x 22 mm implant was packed with small BMP and Attrax. This was tamped into position using the slides and its position was confirmed on AP and lateral fluoroscopy. Subsequently exposure was performed at the L3-4 level and similar dissection was performed with locking of the self-retaining retractor. At this level were able to place an 10 lordotic by 22 x 62mm implant packed in a similar fashion. At the L2-3 level were able to place an 8 mm lordotic by 50 x 22 mm implant packed in a similar fashion. Hemostasis was assured the wounds were irrigated and closed with interrupted Vicryl sutures.  Sterile occlusive dressings were placed.  Retractor times were:  L 45: 15:30 minutes;  L 34: 23:39 minutes; L 23: 19:50 minutes.   Patient was then turned into a prone position on the operating table on [place] Table and using AP  and lateral fluoroscopy throughout this portion of the procedure, pedicle screws were placed using Reline Nuvasive cannulated percutaneous screws. 2 screws were placed at L2 and (5.5 x 45 mm) and 2 at L3 (5.5 x 45), and two at L4 (6.5 x 40 right and 6.5 x 45 left) ,  2 at L5 (6.5 x 45). 100 mm rod was then  affixed to the screw heads do a separate stab incision and locked down on the screws on the left and 95 mm rod on the right. All connections were then torqued and the Towers were disassembled. The wounds were irrigated and then closed with 1, 2-0 and 3-0 Vicryl stitches. Sterile occlusive dressing was placed with Dermabond. Long-acting Marcaine was injected. The patient was then extubated in the operating room and taken to recovery in stable and satisfactory condition having tolerated her operation well. Counts were correct at the end of the case.  Pelvic Parameters:  Preop: PT 7; PI 26; LL-36; PI-LL -10   PLAN OF CARE: Admit to inpatient   PATIENT DISPOSITION:  PACU - hemodynamically stable.   Delay start of Pharmacological VTE agent (>24hrs) due to surgical blood loss or risk of bleeding: yes

## 2018-04-25 NOTE — Anesthesia Preprocedure Evaluation (Signed)
Anesthesia Evaluation  Patient identified by MRN, date of birth, ID band Patient awake    Reviewed: Allergy & Precautions, NPO status , Patient's Chart, lab work & pertinent test results  Airway Mallampati: II  TM Distance: >3 FB     Dental   Pulmonary sleep apnea ,    breath sounds clear to auscultation       Cardiovascular hypertension, +CHF  + Valvular Problems/Murmurs  Rhythm:Regular Rate:Normal     Neuro/Psych  Headaches, Seizures -,     GI/Hepatic hiatal hernia, PUD, GERD  ,  Endo/Other  diabetes  Renal/GU Renal disease     Musculoskeletal   Abdominal   Peds  Hematology  (+) anemia ,   Anesthesia Other Findings   Reproductive/Obstetrics                             Anesthesia Physical Anesthesia Plan  ASA: III  Anesthesia Plan: General   Post-op Pain Management:    Induction: Intravenous  PONV Risk Score and Plan: Ondansetron, Dexamethasone, Midazolam and Treatment may vary due to age or medical condition  Airway Management Planned: Oral ETT  Additional Equipment:   Intra-op Plan:   Post-operative Plan: Extubation in OR  Informed Consent: I have reviewed the patients History and Physical, chart, labs and discussed the procedure including the risks, benefits and alternatives for the proposed anesthesia with the patient or authorized representative who has indicated his/her understanding and acceptance.   Dental advisory given  Plan Discussed with: CRNA and Anesthesiologist  Anesthesia Plan Comments:         Anesthesia Quick Evaluation

## 2018-04-25 NOTE — H&P (Signed)
Patient ID:   [id number] Patient: Justin Patel  Date of Birth: 04/04/1951 Visit Type: Office Visit   Date: 04/12/2018 01:00 PM Provider: Bonna Gains PhD MD   This 67 year old male presents for back pain.  HISTORY OF PRESENT ILLNESS:  1.  back pain  Patient returns my clinic today scheduled for routine medication management follow-up.  He reports that his back pain really has not changed in its location, quality, or in intensity.  He has in the interval since I have seen him, followed up with Dr. Vertell Limber, and has surgery scheduled for about 2 weeks from today.  I have assured him that Dr. Vertell Limber and I will collaborate, and coordinate his postoperative pain management.  Other than requesting medicine to get him up to his date of surgery, his only other order of business is to show me pictures of his new granddaughter.          PAST MEDICAL HISTORY, SURGICAL HISTORY, FAMILY HISTORY, SOCIAL HISTORY AND REVIEW OF SYSTEMS I have reviewed the patient's past medical, surgical, family and social history as well as the comprehensive review of systems as included on the [facility] history form dated 07/12/2017, which I have signed.   MEDICATIONS: (added, continued or stopped this visit) Started Medication Directions Instruction Stopped   acetaminophen 500 mg tablet take 2 tablet by oral route  every 6 hours as needed     amlodipine 5 mg tablet take 1 tablet by oral route  every day     cyclobenzaprine 5 mg tablet take 1 tablet by oral route 2 times every day     duloxetine 60 mg capsule,delayed release take 1 capsule by oral route  every day    04/12/2018 hydrocodone 10 mg-acetaminophen 325 mg tablet take 1 tablet by oral route  every 6 hours as needed for pain    01/10/2018 hydrocodone 7.5 mg-acetaminophen 325 mg tablet take 1 tablet by oral route  every 6 hours as needed for pain  04/12/2018  01/10/2018 hydrocodone 7.5 mg-acetaminophen 325 mg tablet take 1  tablet by oral route  every 6 hours as needed for pain (DNF until 02/09/2018)  04/12/2018  01/10/2018 hydrocodone 7.5 mg-acetaminophen 325 mg tablet take 1 tablet by oral route  every 6 hours as needed for pain (DNF until 03/11/2018)  04/12/2018   losartan 100 mg tablet take 1 tablet by oral route  every bedtime     metoprolol succinate ER 50 mg tablet,extended release 24 hr take 1 tablet by oral route  every day     Nexium 40 mg capsule,delayed release take 1 capsule by oral route  every day     rosuvastatin 10 mg tablet take 1 tablet by oral route  every day     sucralfate 1 gram tablet take 1 tablet by oral route  every day on an empty stomach 1 hour before meals and at bedtime       ALLERGIES: Ingredient Reaction Medication Name Comment  METFORMIN     RAMIPRIL  Altace   PREGABALIN  Lyrica      REVIEW OF SYSTEMS   See scanned patient registration form, dated 07/12/2017, signed and dated on 04/12/2018  Review of Systems Details System Neg/Pos Details  Constitutional Negative Chills, Fatigue, Fever, Malaise, Night sweats, Weight gain and Weight loss.  ENMT Negative Ear drainage, Hearing loss, Nasal drainage, Otalgia, Sinus pressure and Sore throat.  Eyes Negative Eye discharge, Eye pain and Vision changes.  Respiratory Negative  Chronic cough, Cough, Dyspnea, Known TB exposure and Wheezing.  Cardio Negative Chest pain, Claudication, Edema and Irregular heartbeat/palpitations.  GI Negative Abdominal pain, Blood in stool, Change in stool pattern, Constipation, Decreased appetite, Diarrhea, Heartburn, Nausea and Vomiting.  GU Negative Dysuria, Hematuria, Polyuria (Genitourinary), Urinary frequency, Urinary incontinence and Urinary retention.  Endocrine Negative Cold intolerance, Heat intolerance, Polydipsia and Polyphagia.  Neuro Negative Dizziness, Extremity weakness, Gait disturbance, Headache, Memory impairment, Numbness in extremity, Seizures and Tremors.  Psych Negative Anxiety,  Depression and Insomnia.  Integumentary Negative Brittle hair, Brittle nails, Change in shape/size of mole(s), Hair loss, Hirsutism, Hives, Pruritus, Rash and Skin lesion.  MS Positive Back pain.  MS Negative Joint pain, Joint swelling, Muscle weakness and Neck pain.  Hema/Lymph Negative Easy bleeding, Easy bruising and Lymphadenopathy.  Allergic/Immuno Negative Contact allergy, Environmental allergies, Food allergies and Seasonal allergies.  Reproductive Negative Breast discharge and Breast lumps.   PHYSICAL EXAM:   Vitals Date Temp F BP Pulse Ht In Wt Lb BMI BSA Pain Score  04/12/2018  127/92 96 66 183.6 29.63  8/10    PHYSICAL EXAM Details General Level of Distress: no acute distress Overall Appearance: normal    Cardiovascular Cardiac: normal  Respiratory Lungs: non-labored  Neurological Orientation: normal Recent and Remote Memory: normal Attention Span and Concentration:   normal Language: normal Fund of Knowledge: normal  Right Left Sensation: normal normal Upper Extremity Coordination: normal normal  Lower Extremity Coordination: normal normal    Cranial Nerves II. Optic Nerve/Visual Fields: normal III. Oculomotor: normal IV. Trochlear: normal V. Trigeminal: normal VI. Abducens: normal VII. Facial: normal VIII. Acoustic/Vestibular: normal IX. Glossopharyngeal: normal X. Vagus: normal XI. Spinal Accessory: normal XII. Hypoglossal: normal      IMPRESSION:   Lumbar stenosis with associated back pain, radiculopathy  PLAN:  Patient has surgery scheduled with Dr. Vertell Limber in about 2 weeks.  I will coordinate with Dr. Vertell Limber regarding perioperative pain management.  Patient's goal is to be weaned off of all pain medicines within several weeks.  At the patient's request, going to slightly increase his dose of medicine, and he may be able to use that in his postoperative period, but I have asked him to coordinate postoperative pain medicine with Dr. Vertell Limber, in  an appropriate way   Assessment/Plan   # Detail Type Description   1. 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            MEDICATIONS PRESCRIBED TODAY    Rx Quantity Refills  HYDROCODONE-ACETAMINOPHEN 10 mg-325 mg  100 0            Provider:  Bonna Gains PhD MD  04/12/2018 01:29 PM Dictation edited by: Bonna Gains    CC Providers: [name] [address] [place],  [place]  [id number]-   [name]  [address] [place], [place] [address]-               Electronically signed by Bonna Gains PhD MD on 04/12/2018 01:30 PM   Patient ID:   [id number] Patient: Justin Patel  Date of Birth: 12-Sep-1950 Visit Type: Office Visit   Date: 03/15/2018 01:45 PM Provider: Marchia Meiers. Vertell Limber MD   This 67 year old male presents for back pain.  HISTORY OF PRESENT ILLNESS:  1.  back pain  Patient, 67 y/o male, returns to discuss his surgical options. He reports pain in his lower back that radiates across his hips and down  into both legs with the right slightly worse than left.  02/23/18 L-spine MRI wo contrast  L2-3: Broad-based disc osteophyte complex. Bilateral lateral recess narrowing. Mild bilateral facet arthropathy. No evidence of neural foraminal stenosis. No central canal stenosis.  L3-4: Broad-based disc bulge. Right lateral recess stenosis. No evidence of neural foraminal stenosis. No central canal stenosis.  L4-5: Mild broad-based disc bulge. Left lateral recess stenosis. Mild bilateral facet arthropathy. No evidence of neural foraminal stenosis. No central canal stenosis.  L5-S1: Mild broad-based disc bulge. Mild bilateral facet arthropathy. No evidence of neural foraminal stenosis. No central canal stenosis.     The patient is continuing to have significant back and bilateral lower extremity pain.  This is affecting his right greater  than left lower extremities.  Due to the lateral recess stenosis on the MRI as well as his scoliotic curvature, he is continuing to have significant pain.  He is bone density has improved with Prolia treatment.  He is anxious to go ahead with surgery which has been delayed for a considerable period  of time while he has improved with regard to his bone density.  Also his workup for weight loss was negative and he was told that he was actually in quite good health.    Medical/Surgical/Interim History Reviewed, no change.  Last detailed document date:12/20/2016.     Family History:  Reviewed, no changes.  Last detailed document date:12/20/2016.   Social History: Reviewed, no changes. Last detailed document date: 12/20/2016.    MEDICATIONS: (added, continued or stopped this visit) Started Medication Directions Instruction Stopped   acetaminophen 500 mg tablet take 2 tablet by oral route  every 6 hours as needed     amlodipine 5 mg tablet take 1 tablet by oral route  every day     cyclobenzaprine 5 mg tablet take 1 tablet by oral route 2 times every day     duloxetine 60 mg capsule,delayed release take 1 capsule by oral route  every day    01/10/2018 hydrocodone 7.5 mg-acetaminophen 325 mg tablet take 1 tablet by oral route  every 6 hours as needed for pain    01/10/2018 hydrocodone 7.5 mg-acetaminophen 325 mg tablet take 1 tablet by oral route  every 6 hours as needed for pain (DNF until 02/09/2018)    01/10/2018 hydrocodone 7.5 mg-acetaminophen 325 mg tablet take 1 tablet by oral route  every 6 hours as needed for pain (DNF until 03/11/2018)     losartan 100 mg tablet take 1 tablet by oral route  every bedtime     metoprolol succinate ER 50 mg tablet,extended release 24 hr take 1 tablet by oral route  every day     Nexium 40 mg capsule,delayed release take 1 capsule by oral route  every day     rosuvastatin 10 mg tablet take 1 tablet by oral route  every day     sucralfate 1 gram tablet take 1  tablet by oral route  every day on an empty stomach 1 hour before meals and at bedtime       ALLERGIES: Ingredient Reaction Medication Name Comment  METFORMIN     RAMIPRIL  Altace   PREGABALIN  Lyrica       PHYSICAL EXAM:   Vitals Date Temp F BP Pulse Ht In Wt Lb BMI BSA Pain Score  03/15/2018  102/68 68 66 189 30.51  9/10      IMPRESSION:   L-spine MRI reveals L2-3 and L3-4 lateral recess stenosis,  no major disc protrusion at L5-S1. Recommended right L2-3, L3-4, and L4-5 XLIF with percutaneous pedicle screws.  PLAN:  1. Nurse education given 2. LSO brace fitting 3. Right-sided approach with L2-3, L3-4 and L4-5 XLIF with percutaneous screws scheduled 4. Follow-up post op with L-spine AP/Lat X-rays  Orders: Office Procedures/Services: Assessment Service Comments   Orthofix bone growth stimulator.  Please notify patient that Dr. Vertell Limber recommends bone growth stimulator as an adjunct given his osteoporosis.    Diagnostic Procedures: Assessment Procedure  M54.16 Lumbar Spine- AP/Lat  Miscellaneous: Assessment   M43.16 Aspen Lo Sag Rigid Panel Quick   Assessment/Plan   # Detail Type Description   1. Assessment Radiculopathy, lumbar region (M54.16).       2. Assessment Spondylolisthesis, lumbar region (M43.16).   Plan Orders Aspen Lo Sag Rigid Panel Quick.       3. Assessment Osteoporosis, unspecified osteoporosis type, unspecified pathological fracture presence (M81.0).       4. Other Orders Orders not associated to today's assessments.   Plan Orders Orthofix bone growth stimulator.  Please notify patient that Dr. Vertell Limber recommends bone growth stimulator as an adjunct given his osteoporosis.                 Provider:  Marchia Meiers. Vertell Limber MD  03/15/2018 08:36 PM Dictation edited by: Mirian Mo    CC Providers: [name] [address] [place],  [place]  [address]-   [name]  [address]  [address] [place], [place] [address]-               Electronically signed by Marchia Meiers. Vertell Limber MD on 03/16/2018 07:07 PM

## 2018-04-26 ENCOUNTER — Encounter (HOSPITAL_COMMUNITY): Payer: Self-pay | Admitting: Neurosurgery

## 2018-04-26 LAB — GLUCOSE, CAPILLARY
GLUCOSE-CAPILLARY: 135 mg/dL — AB (ref 70–99)
GLUCOSE-CAPILLARY: 104 mg/dL — AB (ref 70–99)
Glucose-Capillary: 153 mg/dL — ABNORMAL HIGH (ref 70–99)
Glucose-Capillary: 91 mg/dL (ref 70–99)

## 2018-04-26 MED ORDER — METHOCARBAMOL 750 MG PO TABS
750.0000 mg | ORAL_TABLET | Freq: Four times a day (QID) | ORAL | Status: DC | PRN
  Administered 2018-04-26 – 2018-04-27 (×5): 750 mg via ORAL
  Filled 2018-04-26 (×5): qty 1

## 2018-04-26 MED ORDER — OXYCODONE HCL 5 MG PO TABS
10.0000 mg | ORAL_TABLET | ORAL | Status: DC | PRN
  Administered 2018-04-26 – 2018-04-27 (×9): 10 mg via ORAL
  Filled 2018-04-26 (×9): qty 2

## 2018-04-26 MED ORDER — DIAZEPAM 5 MG PO TABS
5.0000 mg | ORAL_TABLET | Freq: Four times a day (QID) | ORAL | Status: DC | PRN
  Administered 2018-04-26: 5 mg via ORAL
  Filled 2018-04-26: qty 1

## 2018-04-26 NOTE — Evaluation (Signed)
Occupational Therapy Evaluation and Discharge Patient Details Name: Justin Patel MRN: [id number] DOB: [birth year] Today's Date: 04/26/2018    History of Present Illness patient is a 67 yo male s/p Right Lumbar two-three Lumbar three-four Lumbar four-five anterior  lateral interbody fusion  with posterior percutaneous pedicle screws    Clinical Impression   PTA Pt independent in ADL and mobility (used a SPN PRN). Pt is currently mod I with AE for LB (has AE at home) otherwise Mod A for LB ADL without AE. Pt able to perform transfers without assist. Back handout provided and reviewed adls in detail. Pt educated on: clothing between brace, never sleep in brace, set an alarm at night for medication, avoid sitting for long periods of time, correct bed positioning for sleeping, correct sequence for bed mobility, avoiding lifting more than 5 pounds and never wash directly over incision. All education is complete and patient indicates understanding. OT to sign off at this time. Thank you for the opportunity to serve this patient.       Follow Up Recommendations  No OT follow up;Supervision - Intermittent    Equipment Recommendations  None recommended by OT(Pt has appropriate DME at home)    Recommendations for Other Services       Precautions / Restrictions Precautions Precautions: Back Precaution Booklet Issued: Yes (comment) Precaution Comments: reviewed with patient Required Braces or Orthoses: Spinal Brace Spinal Brace: Lumbar corset;Applied in sitting position Restrictions Weight Bearing Restrictions: No      Mobility Bed Mobility Overal bed mobility: Modified Independent                Transfers Overall transfer level: Needs assistance Equipment used: None Transfers: Sit to/from Stand Sit to Stand: Supervision              Balance Overall balance assessment: Mild deficits observed, not formally tested                                          ADL either performed or assessed with clinical judgement   ADL Overall ADL's : Needs assistance/impaired Eating/Feeding: Independent   Grooming: Supervision/safety;Standing Grooming Details (indicate cue type and reason): educated on compensatory techniques for standing grooming Upper Body Bathing: Supervision/ safety;Sitting Upper Body Bathing Details (indicate cue type and reason): Pt has long handle sponge at home that he uses Lower Body Bathing: Supervison/ safety;Sitting/lateral leans Lower Body Bathing Details (indicate cue type and reason): Pt has long handle sponge at home that he uses, built in seat for sitting Upper Body Dressing : Min guard;Sitting Upper Body Dressing Details (indicate cue type and reason): to don brace Lower Body Dressing: Moderate assistance;Sit to/from stand Lower Body Dressing Details (indicate cue type and reason): Pt able to slide shoes on - will have assist from wife for underwear and other items in the morning prior to going to work Toilet Transfer: Min guard;Ambulation Toilet Transfer Details (indicate cue type and reason): more painful today than yesterday Toileting- Clothing Manipulation and Hygiene: Min guard;Sit to/from stand   Tub/ Shower Transfer: Walk-in shower;Supervision/safety;Ambulation   Functional mobility during ADLs: Supervision/safety General ADL Comments: reviewed handout in full with emphasis on ADL and precaution maintenance     Vision Patient Visual Report: No change from baseline       Perception     Praxis      Pertinent Vitals/Pain Pain Assessment: 0-10 Pain Score:  9  Pain Location: groin, incision on back Pain Descriptors / Indicators: Sore;Grimacing Pain Intervention(s): Limited activity within patient's tolerance;Monitored during session     Hand Dominance Right   Extremity/Trunk Assessment Upper Extremity Assessment Upper Extremity Assessment: Overall WFL for tasks assessed   Lower Extremity  Assessment Lower Extremity Assessment: Overall WFL for tasks assessed   Cervical / Trunk Assessment Cervical / Trunk Assessment: Other exceptions Cervical / Trunk Exceptions: s/p sx   Communication Communication Communication: No difficulties   Cognition Arousal/Alertness: Awake/alert Behavior During Therapy: WFL for tasks assessed/performed Overall Cognitive Status: Within Functional Limits for tasks assessed                                     General Comments       Exercises     Shoulder Instructions      Home Living Family/patient expects to be discharged to:: Private residence Living Arrangements: Spouse/significant other Available Help at Discharge: Family;Available PRN/intermittently Type of Home: House Home Access: Stairs to enter [employer] of Steps: 3 Entrance Stairs-Rails: None Home Layout: One level     Bathroom Shower/Tub: Occupational psychologist: Handicapped height Bathroom Accessibility: Yes How Accessible: Accessible via wheelchair Home Equipment: [place] - 2 wheels;Crutches;Shower seat          Prior Functioning/Environment Level of Independence: Independent                 OT Problem List:        OT Treatment/Interventions:      OT Goals(Current goals can be found in the care plan section) Acute Rehab OT Goals Patient Stated Goal: to be independent, spend time with new granddaughter OT Goal Formulation: With patient Time For Goal Achievement: 05/08/18 Potential to Achieve Goals: Good  OT Frequency:     Barriers to D/C:            Co-evaluation              AM-PAC PT "6 Clicks" Daily Activity     Outcome Measure Help from another person eating meals?: None Help from another person taking care of personal grooming?: None Help from another person toileting, which includes using toliet, bedpan, or urinal?: None Help from another person bathing (including washing, rinsing, drying)?:  None Help from another person to put on and taking off regular upper body clothing?: None Help from another person to put on and taking off regular lower body clothing?: A Lot 6 Click Score: 22   End of Session Equipment Utilized During Treatment: Back brace Nurse Communication: Mobility status;Precautions  Activity Tolerance: Patient limited by pain Patient left: in bed;with call bell/phone within reach                   Time: 4782-9562 OT Time Calculation (min): 40 min Charges:  OT General Charges $OT Visit: 1 Visit OT Evaluation $OT Eval Moderate Complexity: 1 Mod OT Treatments $Self Care/Home Management : 23-37 mins  Justin Patel OTR/L Acute Rehabilitation Services Pager: [phone] Office: [place] 04/26/2018, 11:29 AM

## 2018-04-26 NOTE — Progress Notes (Signed)
Subjective: Patient reports doing well  Objective: Vital signs in last 24 hours: Temp:  [97.7 F (36.5 C)-98.9 F (37.2 C)] 98.9 F (37.2 C) (09/18 0715) Pulse Rate:  [68-80] 80 (09/18 0715) Resp:  [12-20] 16 (09/18 0715) BP: (98-120)/(57-71) 113/67 (09/18 0715) SpO2:  [97 %-100 %] 100 % (09/18 0715)  Intake/Output from previous day: 09/17 0701 - 09/18 0700 In: 3504.5 [P.O.:720; I.V.:2134.5; IV Piggyback:600] Out: 4656 [Urine:1725; Blood:150] Intake/Output this shift: Total I/O In: 240 [P.O.:240] Out: -   Physical Exam: Full strength both legs.  Incisions all CDI.  Sore in back.  Lab Results: No results for input(s): WBC, HGB, HCT, PLT in the last 72 hours. BMET No results for input(s): NA, K, CL, CO2, GLUCOSE, BUN, CREATININE, CALCIUM in the last 72 hours.  Studies/Results: Dg Lumbar Spine 2-3 Views  Result Date: 04/25/2018 CLINICAL DATA:  Lumbar fusion. EXAM: DG C-ARM 61-120 MIN; LUMBAR SPINE - 2-3 VIEW COMPARISON:  04/30/2016. FINDINGS: Patient status post multilevel posterior interbody fusion. Hardware intact. Anatomic alignment. 7 minutes 15 seconds fluoroscopy time utilized. Five spot films obtained. IMPRESSION: Multilevel posterior and interbody fusion with an tonic alignment. Electronically Signed   By: Marcello Moores  Register   On: 04/25/2018 12:40   Dg C-arm 1-60 Min  Result Date: 04/25/2018 CLINICAL DATA:  Lumbar fusion. EXAM: DG C-ARM 61-120 MIN; LUMBAR SPINE - 2-3 VIEW COMPARISON:  04/30/2016. FINDINGS: Patient status post multilevel posterior interbody fusion. Hardware intact. Anatomic alignment. 7 minutes 15 seconds fluoroscopy time utilized. Five spot films obtained. IMPRESSION: Multilevel posterior and interbody fusion with an tonic alignment. Electronically Signed   By: Marcello Moores  Register   On: 04/25/2018 12:40   Dg C-arm 1-60 Min  Result Date: 04/25/2018 CLINICAL DATA:  Lumbar fusion. EXAM: DG C-ARM 61-120 MIN; LUMBAR SPINE - 2-3 VIEW COMPARISON:  04/30/2016.  FINDINGS: Patient status post multilevel posterior interbody fusion. Hardware intact. Anatomic alignment. 7 minutes 15 seconds fluoroscopy time utilized. Five spot films obtained. IMPRESSION: Multilevel posterior and interbody fusion with an tonic alignment. Electronically Signed   By: Marcello Moores  Register   On: 04/25/2018 12:40   Dg C-arm 1-60 Min  Result Date: 04/25/2018 CLINICAL DATA:  Lumbar fusion. EXAM: DG C-ARM 61-120 MIN; LUMBAR SPINE - 2-3 VIEW COMPARISON:  04/30/2016. FINDINGS: Patient status post multilevel posterior interbody fusion. Hardware intact. Anatomic alignment. 7 minutes 15 seconds fluoroscopy time utilized. Five spot films obtained. IMPRESSION: Multilevel posterior and interbody fusion with an tonic alignment. Electronically Signed   By: Marcello Moores  Register   On: 04/25/2018 12:40    Assessment/Plan: Mobilizing with PT.  Home in AM.    LOS: 1 day    Taiz Bickle D, MD 04/26/2018, 8:08 AM

## 2018-04-27 LAB — GLUCOSE, CAPILLARY
GLUCOSE-CAPILLARY: 91 mg/dL (ref 70–99)
GLUCOSE-CAPILLARY: 120 mg/dL — AB (ref 70–99)

## 2018-04-27 MED ORDER — HYDROMORPHONE HCL 2 MG PO TABS: 2.0000 mg | ORAL_TABLET | ORAL | 0 refills | Status: DC | PRN

## 2018-04-27 MED ORDER — METHOCARBAMOL 750 MG PO TABS: 750.0000 mg | ORAL_TABLET | Freq: Four times a day (QID) | ORAL | 1 refills | Status: DC | PRN

## 2018-04-27 MED ORDER — HYDROMORPHONE HCL 2 MG PO TABS
2.0000 mg | ORAL_TABLET | ORAL | Status: DC | PRN
  Administered 2018-04-27 (×2): 2 mg via ORAL
  Filled 2018-04-27 (×2): qty 1

## 2018-04-27 NOTE — Discharge Instructions (Signed)

## 2018-04-27 NOTE — Progress Notes (Signed)
Subjective: Patient reports sore in back  Objective: Vital signs in last 24 hours: Temp:  [98.3 F (36.8 C)-99.5 F (37.5 C)] 98.7 F (37.1 C) (09/19 0438) Pulse Rate:  [69-80] 80 (09/19 0438) Resp:  [16-20] 18 (09/19 0438) BP: (98-133)/(51-73) 120/73 (09/19 0438) SpO2:  [96 %-100 %] 99 % (09/19 0438)  Intake/Output from previous day: 09/18 0701 - 09/19 0700 In: 480 [P.O.:480] Out: -  Intake/Output this shift: No intake/output data recorded.  Physical Exam: Strength full.  No leg pain or numbness.  Dressings CDI.  Lab Results: No results for input(s): WBC, HGB, HCT, PLT in the last 72 hours. BMET No results for input(s): NA, K, CL, CO2, GLUCOSE, BUN, CREATININE, CALCIUM in the last 72 hours.  Studies/Results: Dg Lumbar Spine 2-3 Views  Result Date: 04/25/2018 CLINICAL DATA:  Lumbar fusion. EXAM: DG C-ARM 61-120 MIN; LUMBAR SPINE - 2-3 VIEW COMPARISON:  04/30/2016. FINDINGS: Patient status post multilevel posterior interbody fusion. Hardware intact. Anatomic alignment. 7 minutes 15 seconds fluoroscopy time utilized. Five spot films obtained. IMPRESSION: Multilevel posterior and interbody fusion with an tonic alignment. Electronically Signed   By: Marcello Moores  Register   On: 04/25/2018 12:40   Dg C-arm 1-60 Min  Result Date: 04/25/2018 CLINICAL DATA:  Lumbar fusion. EXAM: DG C-ARM 61-120 MIN; LUMBAR SPINE - 2-3 VIEW COMPARISON:  04/30/2016. FINDINGS: Patient status post multilevel posterior interbody fusion. Hardware intact. Anatomic alignment. 7 minutes 15 seconds fluoroscopy time utilized. Five spot films obtained. IMPRESSION: Multilevel posterior and interbody fusion with an tonic alignment. Electronically Signed   By: Marcello Moores  Register   On: 04/25/2018 12:40   Dg C-arm 1-60 Min  Result Date: 04/25/2018 CLINICAL DATA:  Lumbar fusion. EXAM: DG C-ARM 61-120 MIN; LUMBAR SPINE - 2-3 VIEW COMPARISON:  04/30/2016. FINDINGS: Patient status post multilevel posterior interbody fusion.  Hardware intact. Anatomic alignment. 7 minutes 15 seconds fluoroscopy time utilized. Five spot films obtained. IMPRESSION: Multilevel posterior and interbody fusion with an tonic alignment. Electronically Signed   By: Marcello Moores  Register   On: 04/25/2018 12:40   Dg C-arm 1-60 Min  Result Date: 04/25/2018 CLINICAL DATA:  Lumbar fusion. EXAM: DG C-ARM 61-120 MIN; LUMBAR SPINE - 2-3 VIEW COMPARISON:  04/30/2016. FINDINGS: Patient status post multilevel posterior interbody fusion. Hardware intact. Anatomic alignment. 7 minutes 15 seconds fluoroscopy time utilized. Five spot films obtained. IMPRESSION: Multilevel posterior and interbody fusion with an tonic alignment. Electronically Signed   By: Marcello Moores  Register   On: 04/25/2018 12:40    Assessment/Plan: Will change analgesia and try dilaudid po.  Continue to mobilize with PT.  If doing well, D/C this afternoon.    LOS: 2 days    Peggyann Shoals, MD 04/27/2018, 6:56 AM

## 2018-04-27 NOTE — Discharge Summary (Signed)
Physician Discharge Summary  Patient ID: Justin Patel MRN: [id number] DOB/AGE: 13-Jul-1951 67 y.o.  Admit date: 04/25/2018 Discharge date: 04/27/2018  Admission Diagnoses:Scoliosis, spondylolisthesis, foraminal stenosis, degenerative disc disease, lumbago, radiculopathy  Discharge Diagnoses: Scoliosis, spondylolisthesis, foraminal stenosis, degenerative disc disease, lumbago, radiculopathy Active Problems:   Lumbar scoliosis   Discharged Condition: good  Hospital Course: Patient underwent anterolateral decompression and fusion with percutaneous pedicle screw fixation, L 23, L 34, L 45 levels.  He had good relief of leg pain and radiculopathy, and mobilized well with PT.  He did require significant analgesia for back pain.  He was discharged home on POD 2 having tolerated his operation and hospitalization well.   Consults: None  Significant Diagnostic Studies: None  Treatments: surgery: Patient underwent anterolateral decompression and fusion with percutaneous pedicle screw fixation, L 23, L 34, L 45 levels  Discharge Exam: Blood pressure 120/73, pulse 80, temperature 98.7 F (37.1 C), temperature source Oral, resp. rate 18, SpO2 99 %. Neurologic: Alert and oriented X 3, normal strength and tone. Normal symmetric reflexes. Normal coordination and gait Wound:CDI  Disposition: Home   Allergies as of 04/27/2018      Reactions   Altace [ramipril] Anaphylaxis   Levaquin [levofloxacin In D5w] Hives   Metformin Diarrhea   High doses cause diarrhea.   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   b complex vitamins tablet Take 1 tablet  by mouth every other day.   blood glucose meter kit and supplies Kit Dispense based on patient and insurance preference. Check once daily. ICD doing E11.9.   cholecalciferol 1000 units tablet Commonly known as:  VITAMIN D Take 1,000 Units by mouth every Friday.   denosumab 60 MG/ML Sosy injection Commonly known as:  PROLIA Inject 60 mg into the skin every 6 (six) months.   diphenhydrAMINE 25 MG tablet Commonly known as:  BENADRYL Take 25 mg by mouth daily as needed for allergies.   DULoxetine 60 MG capsule Commonly known as:  CYMBALTA TAKE ONE CAPSULE BY MOUTH DAILY   esomeprazole 40 MG capsule Commonly known as:  NEXIUM TAKE 1 CAPSULE(40 MG) BY MOUTH DAILY   furosemide 20 MG tablet Commonly known as:  LASIX TAKE 1 TABLET BY MOUTH ONCE DAILY   HYDROcodone-acetaminophen 10-325 MG tablet Commonly known as:  NORCO Take 1 tablet by mouth every 6 (six) hours as needed for severe pain.   HYDROmorphone 2 MG tablet Commonly known as:  DILAUDID Take 1 tablet (2 mg total) by mouth every 3 (three) hours as needed for severe pain.   JARDIANCE 25 MG Tabs tablet Generic drug:  empagliflozin TAKE 1 TABLET BY MOUTH ONCE DAILY   losartan 100 MG tablet Commonly known as:  COZAAR TAKE 1 TABLET BY MOUTH DAILY   methocarbamol 750 MG tablet Commonly known as:  ROBAXIN Take 1 tablet (750 mg total) by mouth every 6 (six) hours as needed for muscle spasms.   metoprolol succinate 50 MG 24 hr tablet Commonly known as:  TOPROL-XL TAKE 1 TABLET BY MOUTH WITH OR IMMEDIATLY FOLLOWING A MEAL What changed:  See the new  instructions.   nystatin cream Commonly known as:  MYCOSTATIN Apply 1 application topically 2 (two) times daily. What changed:    when to take this  reasons to take this   rosuvastatin 20 MG tablet Commonly known as:  CRESTOR TAKE 1 TABLET BY MOUTH ONCE DAILY   sucralfate 1 g tablet Commonly known as:  CARAFATE TAKE 1 TABLET BY MOUTH FOUR TIMES DAILY         Signed: Peggyann Shoals, MD 04/27/2018, 7:05 AM

## 2018-04-28 MED FILL — Sodium Chloride IV Soln 0.9%: INTRAVENOUS | Qty: 1000 | Status: AC

## 2018-04-28 MED FILL — Heparin Sodium (Porcine) Inj 1000 Unit/ML: INTRAMUSCULAR | Qty: 30 | Status: AC

## 2018-05-08 ENCOUNTER — Ambulatory Visit: Payer: Medicare Other | Admitting: Family Medicine

## 2018-05-08 ENCOUNTER — Encounter: Payer: Self-pay | Admitting: Family Medicine

## 2018-05-08 VITALS — BP 120/82 | HR 84 | Temp 98.7°F | Ht 66.5 in | Wt 184.2 lb

## 2018-05-08 DIAGNOSIS — N1831 Acute kidney failure, unspecified: Secondary | ICD-10-CM

## 2018-05-08 DIAGNOSIS — Z23 Encounter for immunization: Secondary | ICD-10-CM

## 2018-05-08 DIAGNOSIS — N183 Chronic kidney disease, stage 3 unspecified: Secondary | ICD-10-CM

## 2018-05-08 DIAGNOSIS — R634 Abnormal weight loss: Secondary | ICD-10-CM

## 2018-05-08 DIAGNOSIS — M5136 Other intervertebral disc degeneration, lumbar region: Secondary | ICD-10-CM

## 2018-05-08 DIAGNOSIS — N179 Acute kidney failure, unspecified: Secondary | ICD-10-CM

## 2018-05-08 DIAGNOSIS — D7589 Other specified diseases of blood and blood-forming organs: Secondary | ICD-10-CM

## 2018-05-08 DIAGNOSIS — E119 Type 2 diabetes mellitus without complications: Secondary | ICD-10-CM

## 2018-05-08 DIAGNOSIS — L304 Erythema intertrigo: Secondary | ICD-10-CM

## 2018-05-08 DIAGNOSIS — M51369 Other intervertebral disc degeneration, lumbar region without mention of lumbar back pain or lower extremity pain: Secondary | ICD-10-CM

## 2018-05-08 HISTORY — DX: Acute kidney failure, unspecified: N17.9

## 2018-05-08 HISTORY — DX: Acute kidney failure, unspecified: N18.31

## 2018-05-08 LAB — CBC WITH DIFFERENTIAL/PLATELET
BASOS PCT: 0.6 % (ref 0.0–3.0)
Basophils Absolute: 0.1 10*3/uL (ref 0.0–0.1)
EOS PCT: 1.6 % (ref 0.0–5.0)
Eosinophils Absolute: 0.2 10*3/uL (ref 0.0–0.7)
HCT: 41.4 % (ref 39.0–52.0)
HEMOGLOBIN: 13.7 g/dL (ref 13.0–17.0)
LYMPHS ABS: 1.8 10*3/uL (ref 0.7–4.0)
Lymphocytes Relative: 15.6 % (ref 12.0–46.0)
MCHC: 33 g/dL (ref 30.0–36.0)
MCV: 101.5 fl — ABNORMAL HIGH (ref 78.0–100.0)
MONO ABS: 0.6 10*3/uL (ref 0.1–1.0)
Monocytes Relative: 5.4 % (ref 3.0–12.0)
Neutro Abs: 8.9 10*3/uL — ABNORMAL HIGH (ref 1.4–7.7)
Neutrophils Relative %: 76.8 % (ref 43.0–77.0)
Platelets: 404 10*3/uL — ABNORMAL HIGH (ref 150.0–400.0)
RBC: 4.08 Mil/uL — ABNORMAL LOW (ref 4.22–5.81)
RDW: 14.1 % (ref 11.5–15.5)
WBC: 11.6 10*3/uL — AB (ref 4.0–10.5)

## 2018-05-08 LAB — VITAMIN B12: VITAMIN B 12: 581 pg/mL (ref 211–911)

## 2018-05-08 LAB — FOLATE: Folate: 7.4 ng/mL (ref >5.9)

## 2018-05-08 MED ORDER — LINAGLIPTIN 5 MG PO TABS: 5.0000 mg | ORAL_TABLET | Freq: Every day | ORAL | 3 refills | Status: DC

## 2018-05-08 MED ORDER — NYSTATIN-TRIAMCINOLONE 100000-0.1 UNIT/GM-% EX OINT: 1.0000 "application " | TOPICAL_OINTMENT | Freq: Two times a day (BID) | CUTANEOUS | 0 refills | Status: DC

## 2018-05-08 NOTE — Assessment & Plan Note (Signed)
Recent A1c of 7.4.  We are going to discontinue Jardiance given possible side effects of rash.  We will trial Tradjenta.

## 2018-05-08 NOTE — Assessment & Plan Note (Signed)
Recheck CBC. 

## 2018-05-08 NOTE — Assessment & Plan Note (Signed)
Status post fusion.  Appears to be doing quite well with this.  He will follow-up with his surgeon as planned.

## 2018-05-08 NOTE — Assessment & Plan Note (Signed)
Patient underwent an extensive evaluation without an obvious cause.  Potentially could be related to the discomfort he was having and lack of an appetite related to that.  His weight has been stable and thus we will continue to monitor.

## 2018-05-08 NOTE — Patient Instructions (Addendum)
Nice to see you. Please stop the Jardiance.  We will send in [place] for you to start on for your diabetes. Please use the cream on your groin as prescribed.  If it is not improving over the next week please let us know.  If it worsens or involves your testicles or perineum please be evaluated immediately. Please follow-up with neurosurgery as planned.

## 2018-05-08 NOTE — Assessment & Plan Note (Signed)
Trial of Mycolog.  If not improving let us know.  If it involves his testicles, scrotum, or perineum he will be evaluated again.

## 2018-05-08 NOTE — Assessment & Plan Note (Signed)
Noted on lab work.  Renal ultrasound and urine protein testing ordered.

## 2018-05-08 NOTE — Progress Notes (Signed)
[name], MD Phone: [phone]  Justin Patel is a 67 y.o. male who presents today for follow-up.  CC: Diabetes, back pain, weight loss, candidal intertrigo  DIABETES Disease Monitoring: Blood Sugar ranges-90-120 fasting, 160-170 posprandial Polyuria/phagia/dipsia- no     Medications: Compliance- taking jardiance Hypoglycemic symptoms- no  Candidal intertrigo: Patient notes rash in his right groin that is red and raw.  Nystatin was not very beneficial.  He would use topical antibiotic ointment on it it would help.  It does not involve his testicles or perineum.  The patient additionally notes that the rash on his legs is improved though though feels as though it may have been related to the [place] as it started after he started on the Jardiance.  Chronic back pain: Status post fusion.  Notes his pain is quite a bit better.  He notes a dull ache.  Gets a little more intense if he moves around.  He sees neurosurgery this coming month.  No radiation of pain.  No incontinence.  No numbness or weakness.  Weight loss: His weight is been stable for some time now.  He had an extensive evaluation that was unrevealing.  His appetite has gotten a little bit better now that he has had his back surgery.  Eat small amounts at a time.  Patient reports concern regarding a possible EKG abnormality seen previously.  He has had no chest pain or breathing issues.   Social History   Tobacco Use  Smoking Status Never Smoker  Smokeless Tobacco Never Used     ROS see history of present illness  Objective  Physical Exam Vitals:   05/08/18 1105  BP: 120/82  Pulse: 84  Temp: 98.7 F (37.1 C)  SpO2: 96%    BP Readings from Last 3 Encounters:  05/08/18 120/82  04/27/18 127/79  04/17/18 108/73   Wt Readings from Last 3 Encounters:  05/08/18 184 lb 3.2 oz (83.6 kg)  04/17/18 184 lb (83.5 kg)  03/31/18 185 lb 6.4 oz (84.1 kg)    Physical Exam  Constitutional: No distress.    Cardiovascular: Normal rate, regular rhythm and normal heart sounds.  Pulmonary/Chest: Effort normal and breath sounds normal.  Musculoskeletal: He exhibits no edema.  Mild soft tissue tenderness throughout his low back, no erythema, no induration, no fluctuance, surgical sites are healing well with no signs of infection  Neurological: He is alert.  5/5 strength bilateral quads, hamstrings, plantar flexion, and dorsiflexion, sensation light touch intact bilateral lower extremities  Skin: Skin is warm and dry. He is not diaphoretic.  Right groin area with erythematous rash with satellite lesions, does not involve testicles or perineum   Diabetic Foot Exam - Simple   Simple Foot Form Diabetic Foot exam was performed with the following findings:  Yes 05/08/2018 11:39 AM  Visual Inspection See comments:  Yes Sensation Testing Intact to touch and monofilament testing bilaterally:  Yes Pulse Check Posterior Tibialis and Dorsalis pulse intact bilaterally:  Yes Comments Small scar on the dorsum of the left foot distally, nontender, no other lesions noted on bilateral feet      Assessment/Plan: Please see individual problem list.  Diabetes ([place]) Recent A1c of 7.4.  We are going to discontinue Jardiance given possible side effects of rash.  We will trial Tradjenta.  Intertrigo Trial of Mycolog.  If not improving let us know.  If it involves his testicles, scrotum, or perineum he will be evaluated again.  DDD (degenerative disc disease), lumbar Status post  fusion.  Appears to be doing quite well with this.  He will follow-up with his surgeon as planned.  Abnormal weight loss Patient underwent an extensive evaluation without an obvious cause.  Potentially could be related to the discomfort he was having and lack of an appetite related to that.  His weight has been stable and thus we will continue to monitor.  Macrocytosis Recheck CBC.  CKD (chronic kidney disease) stage 3, GFR 30-59  ml/min (HCC) Noted on lab work.  Renal ultrasound and urine protein testing ordered.  Prior EKG has been reviewed.  There are no worrisome findings and it appears stable compared to prior.  He is been asymptomatic.  I reassured the patient.  He should be evaluated if he develops symptoms.  Orders Placed This Encounter  Procedures  . US Renal    Standing Status:   Future    Standing Expiration Date:   07/09/2019    Order Specific Question:   Reason for Exam (SYMPTOM  OR DIAGNOSIS REQUIRED)    Answer:   CKD stage 3    Order Specific Question:   Preferred imaging location?    Answer:   [facility]  . Flu vaccine HIGH DOSE PF (Fluzone High dose)  . Protein / creatinine ratio, urine  . CBC w/Diff  . B12  . Folate    Meds ordered this encounter  Medications  . nystatin-triamcinolone ointment (MYCOLOG)    Sig: Apply 1 application topically 2 (two) times daily.    Dispense:  30 g    Refill:  0  . linagliptin (TRADJENTA) 5 MG TABS tablet    Sig: Take 1 tablet (5 mg total) by mouth daily.    Dispense:  30 tablet    Refill:  Kawela [place], MD [place]

## 2018-05-09 ENCOUNTER — Other Ambulatory Visit: Payer: Self-pay | Admitting: Family Medicine

## 2018-05-09 DIAGNOSIS — D72829 Elevated white blood cell count, unspecified: Secondary | ICD-10-CM

## 2018-05-09 LAB — PROTEIN / CREATININE RATIO, URINE
Creatinine, Urine: 55 mg/dL (ref 20–320)
Protein/Creat Ratio: 327 mg/g creat — ABNORMAL HIGH (ref 22–128)
TOTAL PROTEIN, URINE: 18 mg/dL (ref 5–25)

## 2018-05-10 ENCOUNTER — Other Ambulatory Visit: Payer: Self-pay | Admitting: Family Medicine

## 2018-05-10 DIAGNOSIS — E119 Type 2 diabetes mellitus without complications: Secondary | ICD-10-CM

## 2018-05-10 NOTE — Telephone Encounter (Signed)
This medication is NOT on patient current medication list will need to contact the patient

## 2018-05-12 ENCOUNTER — Telehealth: Payer: Self-pay

## 2018-05-12 ENCOUNTER — Other Ambulatory Visit: Payer: Self-pay | Admitting: Family Medicine

## 2018-05-12 DIAGNOSIS — R809 Proteinuria, unspecified: Secondary | ICD-10-CM

## 2018-05-12 DIAGNOSIS — N183 Chronic kidney disease, stage 3 unspecified: Secondary | ICD-10-CM

## 2018-05-12 NOTE — Telephone Encounter (Signed)
Pt needs to be scheduled for an same day appt.   Rash on left thigh.   10/8 @ 2:00 PM  Sent to [place] to schedule

## 2018-05-12 NOTE — Telephone Encounter (Signed)
Patient scheduled.

## 2018-05-15 ENCOUNTER — Other Ambulatory Visit: Payer: Self-pay | Admitting: Family Medicine

## 2018-05-15 DIAGNOSIS — E119 Type 2 diabetes mellitus without complications: Secondary | ICD-10-CM

## 2018-05-16 ENCOUNTER — Ambulatory Visit: Payer: Medicare Other | Admitting: Family Medicine

## 2018-05-16 ENCOUNTER — Encounter: Payer: Self-pay | Admitting: Family Medicine

## 2018-05-16 VITALS — BP 112/78 | HR 75 | Temp 98.5°F | Ht 66.5 in | Wt 186.2 lb

## 2018-05-16 DIAGNOSIS — L304 Erythema intertrigo: Secondary | ICD-10-CM

## 2018-05-16 DIAGNOSIS — R1031 Right lower quadrant pain: Secondary | ICD-10-CM | POA: Diagnosis not present

## 2018-05-16 LAB — CBC WITH DIFFERENTIAL/PLATELET
BASOS ABS: 0.1 10*3/uL (ref 0.0–0.1)
Basophils Relative: 0.6 % (ref 0.0–3.0)
Eosinophils Absolute: 0.3 10*3/uL (ref 0.0–0.7)
Eosinophils Relative: 3.2 % (ref 0.0–5.0)
HEMATOCRIT: 40.4 % (ref 39.0–52.0)
Hemoglobin: 13 g/dL (ref 13.0–17.0)
LYMPHS PCT: 26.4 % (ref 12.0–46.0)
Lymphs Abs: 2.4 10*3/uL (ref 0.7–4.0)
MCHC: 32.2 g/dL (ref 30.0–36.0)
MCV: 103.2 fl — AB (ref 78.0–100.0)
MONOS PCT: 8.5 % (ref 3.0–12.0)
Monocytes Absolute: 0.8 10*3/uL (ref 0.1–1.0)
NEUTROS PCT: 61.3 % (ref 43.0–77.0)
Neutro Abs: 5.6 10*3/uL (ref 1.4–7.7)
PLATELETS: 326 10*3/uL (ref 150.0–400.0)
RBC: 3.92 Mil/uL — ABNORMAL LOW (ref 4.22–5.81)
RDW: 14.4 % (ref 11.5–15.5)
WBC: 9.1 10*3/uL (ref 4.0–10.5)

## 2018-05-16 NOTE — Patient Instructions (Signed)
Nice to see you. We will check lab work today.  If your white blood cell count is significantly elevated we will likely be obtaining a CT scan of your abdomen and pelvis. If your pain worsens, you develop nausea or vomiting, lack of bowel movements, blood in your stool, fevers, or you feel poorly please go to the emergency room.

## 2018-05-16 NOTE — Progress Notes (Signed)
  Justin Rumps, MD Phone: [phone]  Justin Patel is a 67 y.o. male who presents today for same-day visit.  CC: Right groin rash, right lower quadrant pain  Right groin rash: Patient notes this rash is improved quite a bit with the nystatin-triamcinolone.   Right lower quadrant pain: Patient notes this has been going on since his prior back surgery.  It was going on prior to when he saw me though he did not report this.  He notes his skin in his right lower quadrant feels quite tender.  Also notes discomfort when he pushes.  No pain with just sitting there.  No involvement of his penis, testicles, or inguinal area.  He notes no dysuria, hematuria, constipation, blood in stool, or diarrhea.  No issues with bowel movements.  Reports he is status post appendectomy.  Social History   Tobacco Use  Smoking Status Never Smoker  Smokeless Tobacco Never Used     ROS see history of present illness  Objective  Physical Exam Vitals:   05/16/18 1344  BP: 112/78  Pulse: 75  Temp: 98.5 F (36.9 C)  SpO2: 93%    BP Readings from Last 3 Encounters:  05/16/18 112/78  05/08/18 120/82  04/27/18 127/79   Wt Readings from Last 3 Encounters:  05/16/18 186 lb 3.2 oz (84.5 kg)  05/08/18 184 lb 3.2 oz (83.6 kg)  04/17/18 184 lb (83.5 kg)    Physical Exam  Constitutional: No distress.  Cardiovascular: Normal rate, regular rhythm and normal heart sounds.  Pulmonary/Chest: Effort normal and breath sounds normal.  Abdominal: Soft. Bowel sounds are normal. He exhibits no distension and no mass. There is tenderness (Right lower quadrant). There is no rebound and no guarding.  Musculoskeletal: He exhibits no edema.  Neurological: He is alert.  Skin: Skin is warm and dry. He is not diaphoretic.  Inguinal rash has almost resolved   Assessment/Plan: Please see individual problem list.  RLQ abdominal pain Patient with right lower quadrant abdominal discomfort since having his back  surgery previously.  This could be a musculoskeletal issue.  Could be an intra-abdominal process.  He does not have his appendix.  We will check a CBC and if his white blood cell count is significantly elevated consider imaging.  If persistent would consider imaging.  Given return precautions.  Intertrigo This has improved significantly.  He will continue to monitor.   Orders Placed This Encounter  Procedures  . CBC w/Diff    No orders of the defined types were placed in this encounter.    Justin Rumps, MD [place]

## 2018-05-18 ENCOUNTER — Ambulatory Visit
Admission: RE | Admit: 2018-05-18 | Discharge: 2018-05-18 | Disposition: A | Discharge status: Home / Self Care | Payer: Medicare Other | Source: Ambulatory Visit | Attending: Family Medicine | Admitting: Family Medicine

## 2018-05-18 DIAGNOSIS — R1031 Right lower quadrant pain: Secondary | ICD-10-CM

## 2018-05-18 DIAGNOSIS — N183 Chronic kidney disease, stage 3 unspecified: Secondary | ICD-10-CM

## 2018-05-18 HISTORY — DX: Right lower quadrant pain: R10.31

## 2018-05-18 NOTE — Assessment & Plan Note (Signed)
Patient with right lower quadrant abdominal discomfort since having his back surgery previously.  This could be a musculoskeletal issue.  Could be an intra-abdominal process.  He does not have his appendix.  We will check a CBC and if his white blood cell count is significantly elevated consider imaging.  If persistent would consider imaging.  Given return precautions.

## 2018-05-18 NOTE — Assessment & Plan Note (Signed)
This has improved significantly.  He will continue to monitor.

## 2018-06-02 ENCOUNTER — Telehealth: Payer: Self-pay | Admitting: *Deleted

## 2018-06-02 DIAGNOSIS — D72829 Elevated white blood cell count, unspecified: Secondary | ICD-10-CM

## 2018-06-02 NOTE — Telephone Encounter (Signed)
Pt coming in for labs on Monday. Please place future lab order.  Thanks

## 2018-06-04 NOTE — Telephone Encounter (Signed)
Order placed

## 2018-06-05 ENCOUNTER — Other Ambulatory Visit (INDEPENDENT_AMBULATORY_CARE_PROVIDER_SITE_OTHER): Payer: Medicare Other

## 2018-06-05 DIAGNOSIS — D72829 Elevated white blood cell count, unspecified: Secondary | ICD-10-CM | POA: Diagnosis not present

## 2018-06-05 LAB — CBC WITH DIFFERENTIAL/PLATELET
BASOS ABS: 0 10*3/uL (ref 0.0–0.1)
Basophils Relative: 0.4 % (ref 0.0–3.0)
EOS ABS: 0.1 10*3/uL (ref 0.0–0.7)
Eosinophils Relative: 1.2 % (ref 0.0–5.0)
HEMATOCRIT: 45.8 % (ref 39.0–52.0)
HEMOGLOBIN: 15 g/dL (ref 13.0–17.0)
LYMPHS PCT: 31.6 % (ref 12.0–46.0)
Lymphs Abs: 3.1 10*3/uL (ref 0.7–4.0)
MCHC: 32.8 g/dL (ref 30.0–36.0)
MCV: 103.9 fl — AB (ref 78.0–100.0)
MONOS PCT: 8.4 % (ref 3.0–12.0)
Monocytes Absolute: 0.8 10*3/uL (ref 0.1–1.0)
Neutro Abs: 5.8 10*3/uL (ref 1.4–7.7)
Neutrophils Relative %: 58.4 % (ref 43.0–77.0)
Platelets: 266 10*3/uL (ref 150.0–400.0)
RBC: 4.41 Mil/uL (ref 4.22–5.81)
RDW: 15.6 % — ABNORMAL HIGH (ref 11.5–15.5)
WBC: 9.9 10*3/uL (ref 4.0–10.5)

## 2018-06-07 ENCOUNTER — Telehealth: Payer: Self-pay | Admitting: Family Medicine

## 2018-06-07 DIAGNOSIS — D7589 Other specified diseases of blood and blood-forming organs: Secondary | ICD-10-CM

## 2018-06-07 NOTE — Telephone Encounter (Signed)
-----  Message from Cammie Sickle, MD sent at 06/07/2018  8:33 AM EDT ----- Thanks for reaching out to me. Hope all is well with you.   Clinically I am not concerned-recent bone marrow biopsy was fairly unremarkable.  In general, I would not recommend any further work-up at this time.  However as patient is very nervous anxious-given his history of gastric bypass-copper/zinc levels could be checked-rarely could cause macrocytosis.  In general this should all be asymptomatic.  GB  ----- Message ----- From: [name], MD Sent: 06/06/2018  11:31 AM EDT To: Cammie Sickle, MD  Hi Dr Rogue Bussing,  I have been following Mr Macbride and got a recent CBC that revealed an increased MCV. His B12 and folate were recently normal.  It looks like he had an extensive work up earlier this year for his weight loss. I wanted to see if there was any additional work up that is needed from your perspective given his MCV. Thanks for your help.   [name]   ----- Message ----- From: Interface, Lab In Three Zero One Sent: 06/05/2018   2:23 PM EDT To: [name], MD

## 2018-06-07 NOTE — Telephone Encounter (Signed)
Please let the patient know I heard back from his hematologist.  They recommended checking zinc and copper levels given his history of gastric bypass as he could be deficient.  If he is willing I can place orders for those and we can get him set up for a lab appointment.

## 2018-06-09 NOTE — Telephone Encounter (Signed)
Orders placed.

## 2018-06-09 NOTE — Addendum Note (Signed)
Addended by: [name] on: 06/09/2018 06:23 PM   Modules accepted: Orders

## 2018-06-09 NOTE — Telephone Encounter (Signed)
Called and spoke with patient. Pt advised and has been scheduled fo rlab work on 06/16/2018 please place lab orders.   Thanks  Sent to PCP

## 2018-06-16 ENCOUNTER — Other Ambulatory Visit (INDEPENDENT_AMBULATORY_CARE_PROVIDER_SITE_OTHER): Payer: Medicare Other

## 2018-06-16 ENCOUNTER — Other Ambulatory Visit: Payer: Self-pay | Admitting: Family Medicine

## 2018-06-16 DIAGNOSIS — D7589 Other specified diseases of blood and blood-forming organs: Secondary | ICD-10-CM

## 2018-06-16 DIAGNOSIS — E119 Type 2 diabetes mellitus without complications: Secondary | ICD-10-CM

## 2018-06-20 ENCOUNTER — Telehealth: Payer: Self-pay

## 2018-06-20 DIAGNOSIS — D7589 Other specified diseases of blood and blood-forming organs: Secondary | ICD-10-CM

## 2018-06-20 LAB — ZINC: Zinc: 62 ug/dL (ref 60–130)

## 2018-06-20 NOTE — Telephone Encounter (Signed)
Please let the patient know that the lab work was what was suggested by Dr. Brahmanday.  Clinically Dr. Brahmanday was not concerned given his recent bone marrow biopsy.  He suggested the labs regarding zinc and copper.  Other than that there is no recommended further work-up. 

## 2018-06-20 NOTE — Telephone Encounter (Signed)
Sent to PCP does not look like referral has been placed to see hemologist

## 2018-06-20 NOTE — Telephone Encounter (Signed)
Copied from [place] [id number]. Topic: General - Inquiry >> Jun 20, 2018  2:30 PM Scherrie Gerlach wrote: Reason for CRM: pt wants to know where to go from here since he still has the macrocytosis. Pt wants to know if you are going to refer him to his hematologist, Dr Rogue Bussing?  Pt states ok to send through [employer]

## 2018-06-21 NOTE — Telephone Encounter (Signed)
I am happy to have him see Dr. Rogue Bussing who is a hematologist who he saw previously.  Dr. Rogue Bussing suggested the lab work to check zinc and copper levels and then advised that no additional work-up needed to be completed.  We could get him scheduled with Dr. Rogue Bussing if the patient would prefer that.

## 2018-06-21 NOTE — Telephone Encounter (Signed)
Sent to PCP to place referral pt would like for this to be done. Pt will need an appt in the morning on a Monday. Thank.

## 2018-06-21 NOTE — Telephone Encounter (Signed)
Called and spoke with patient. Pt stated that he does feel that he should stills see a hematologist because he still has macrocytosis and he is concern of having myopathic syndrome since he is dealing with joint pain, weight loss, lack of appetite.   Sent to PCP

## 2018-06-22 ENCOUNTER — Other Ambulatory Visit: Payer: Self-pay | Admitting: Neurosurgery

## 2018-06-22 DIAGNOSIS — M5440 Lumbago with sciatica, unspecified side: Secondary | ICD-10-CM

## 2018-06-22 NOTE — Telephone Encounter (Signed)
Referral placed.

## 2018-06-22 NOTE — Addendum Note (Signed)
Addended by: Caryl Bis, Margerie Fraiser G on: 06/22/2018 09:30 AM   Modules accepted: Orders

## 2018-06-23 LAB — COPPER, BLOOD: Copper, Blood: 72 ug/dL

## 2018-06-27 ENCOUNTER — Telehealth: Payer: Self-pay | Admitting: Family Medicine

## 2018-06-27 NOTE — Telephone Encounter (Signed)
Verification filed on [employer] portal for Prolia.

## 2018-07-05 ENCOUNTER — Inpatient Hospital Stay: Payer: Medicare Other | Attending: Internal Medicine | Admitting: Internal Medicine

## 2018-07-05 ENCOUNTER — Other Ambulatory Visit: Payer: Self-pay | Admitting: Family Medicine

## 2018-07-05 ENCOUNTER — Other Ambulatory Visit: Payer: Self-pay

## 2018-07-05 ENCOUNTER — Encounter: Payer: Self-pay | Admitting: Internal Medicine

## 2018-07-05 VITALS — BP 164/109 | HR 82 | Temp 99.3°F | Resp 22 | Ht 66.5 in | Wt 182.0 lb

## 2018-07-05 DIAGNOSIS — D72829 Elevated white blood cell count, unspecified: Secondary | ICD-10-CM

## 2018-07-05 DIAGNOSIS — R5383 Other fatigue: Secondary | ICD-10-CM

## 2018-07-05 DIAGNOSIS — Z9884 Bariatric surgery status: Secondary | ICD-10-CM | POA: Diagnosis not present

## 2018-07-05 DIAGNOSIS — R634 Abnormal weight loss: Secondary | ICD-10-CM

## 2018-07-05 DIAGNOSIS — Z807 Family history of other malignant neoplasms of lymphoid, hematopoietic and related tissues: Secondary | ICD-10-CM

## 2018-07-05 DIAGNOSIS — Z8041 Family history of malignant neoplasm of ovary: Secondary | ICD-10-CM | POA: Diagnosis not present

## 2018-07-05 DIAGNOSIS — R5381 Other malaise: Secondary | ICD-10-CM

## 2018-07-05 DIAGNOSIS — Z801 Family history of malignant neoplasm of trachea, bronchus and lung: Secondary | ICD-10-CM | POA: Diagnosis not present

## 2018-07-05 DIAGNOSIS — E119 Type 2 diabetes mellitus without complications: Secondary | ICD-10-CM

## 2018-07-05 DIAGNOSIS — Z8042 Family history of malignant neoplasm of prostate: Secondary | ICD-10-CM

## 2018-07-05 DIAGNOSIS — Z79899 Other long term (current) drug therapy: Secondary | ICD-10-CM | POA: Diagnosis not present

## 2018-07-05 DIAGNOSIS — E1122 Type 2 diabetes mellitus with diabetic chronic kidney disease: Secondary | ICD-10-CM

## 2018-07-05 DIAGNOSIS — K219 Gastro-esophageal reflux disease without esophagitis: Secondary | ICD-10-CM

## 2018-07-05 DIAGNOSIS — I130 Hypertensive heart and chronic kidney disease with heart failure and stage 1 through stage 4 chronic kidney disease, or unspecified chronic kidney disease: Secondary | ICD-10-CM | POA: Diagnosis not present

## 2018-07-05 DIAGNOSIS — E785 Hyperlipidemia, unspecified: Secondary | ICD-10-CM

## 2018-07-05 DIAGNOSIS — M1990 Unspecified osteoarthritis, unspecified site: Secondary | ICD-10-CM

## 2018-07-05 DIAGNOSIS — N189 Chronic kidney disease, unspecified: Secondary | ICD-10-CM | POA: Diagnosis not present

## 2018-07-05 DIAGNOSIS — D7589 Other specified diseases of blood and blood-forming organs: Secondary | ICD-10-CM | POA: Diagnosis not present

## 2018-07-05 DIAGNOSIS — Z800 Family history of malignant neoplasm of digestive organs: Secondary | ICD-10-CM | POA: Diagnosis not present

## 2018-07-05 DIAGNOSIS — E610 Copper deficiency: Secondary | ICD-10-CM

## 2018-07-05 DIAGNOSIS — G4730 Sleep apnea, unspecified: Secondary | ICD-10-CM | POA: Diagnosis not present

## 2018-07-05 DIAGNOSIS — R5382 Chronic fatigue, unspecified: Secondary | ICD-10-CM

## 2018-07-05 NOTE — Progress Notes (Signed)
[name], [name]; [name]; [name]; [name]   Review of Systems  Constitutional: Positive for malaise/fatigue. Negative for chills, diaphoresis and fever.  HENT: Negative.  Negative for hearing loss, nosebleeds and sore throat.   Eyes: Negative.  Negative for double vision.  Respiratory: Negative.  Negative for cough, hemoptysis, sputum production, shortness of breath and wheezing.   Cardiovascular: Negative.  Negative for chest pain, palpitations, orthopnea and leg swelling.  Gastrointestinal: Negative.  Negative for abdominal pain, blood in stool, constipation, diarrhea, heartburn, melena, nausea and vomiting.  Genitourinary: Negative.  Negative for dysuria, frequency and urgency.  Musculoskeletal: Negative for back pain.  Neurological: Negative.  Negative for dizziness, tingling, focal weakness, weakness and headaches.  Psychiatric/Behavioral: Negative.  Negative for depression. The patient is not nervous/anxious and does not have insomnia.      MEDICAL HISTORY:  Past Medical History:  Diagnosis Date  . 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 Anxiety   . Arthritis   . 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   removal scheduled to clear margins.  . CHF (congestive heart failure) ([place])    no longer after weight loss  . Chronic kidney disease    cysts  . Degenerative disc disease   . Diabetes mellitus    no longer diabetic-or on meds  . Family history of adverse reaction to anesthesia    sons wake up combative  . Gastric ulcer   . GERD (gastroesophageal reflux disease)   . H/O hiatal hernia   . Headache(784.0)    sinus  . Heart murmur   . Hx of congestive heart failure   . Hyperlipidemia   . Hypertension   . Iron deficiency anemia 02/28/2015  . Sacral fracture, closed ([place])   . Seizures ([place])    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, after GI bleed  . Sleep apnea    hx not now since wt loss  . Stones in the urinary tract   . Syncope and collapse     SURGICAL HISTORY: Past Surgical History:  Procedure Laterality Date  . ANTERIOR CERVICAL DECOMP/DISCECTOMY FUSION  01/26/2012   Procedure: ANTERIOR CERVICAL DECOMPRESSION/DISCECTOMY FUSION 3 LEVELS;  Surgeon: Floyce Stakes, MD;  [facility];  Service: Neurosurgery;  Laterality: N/A;  Cervical three-four Cervical four-five Cervical five-six Cervical six-seven , Anterior cervical decompression/diskectomy, fusion, plate  . ANTERIOR LAT LUMBAR FUSION Right 04/25/2018   Procedure: Right Lumbar two-three Lumbar three-four Lumbar four-five anterior  lateral interbody fusion  with posterior percutaneous pedicle screws;  Surgeon: Erline Levine, MD;  Location: [facility];  Service: Neurosurgery;  Laterality: Right;  . APPENDECTOMY    . [place], normal  . CARDIAC CATHETERIZATION     [place]  . COLONOSCOPY WITH PROPOFOL  N/A 12/27/2017   Procedure: COLONOSCOPY WITH PROPOFOL;  Surgeon: Toledo, Benay Pike, MD;  Location: [facility];  Service: Gastroenterology;  Laterality: N/A;  . ESOPHAGOGASTRODUODENOSCOPY (EGD) WITH PROPOFOL N/A 12/27/2017   Procedure: ESOPHAGOGASTRODUODENOSCOPY (EGD) WITH PROPOFOL;   Surgeon: Toledo, Benay Pike, MD;  Location: [facility];  Service: Gastroenterology;  Laterality: N/A;  . GASTRIC BYPASS  2010   [place]  . HERNIA REPAIR  2000   hiatal  . JOINT REPLACEMENT    . KNEE ARTHROSCOPY     bilateral, left x 2  . LUMBAR PERCUTANEOUS PEDICLE SCREW 3 LEVEL N/A 04/25/2018   Procedure: LUMBAR PERCUTANEOUS PEDICLE SCREW 3 LEVEL;  Surgeon: Erline Levine, MD;  Location: [facility];  Service: Neurosurgery;  Laterality: N/A;  . NASAL SINUS SURGERY     x5  . OSTEOTOMY  2001   left  . TONSILLECTOMY    . TOTAL KNEE ARTHROPLASTY Left 05/2014   Dr. Marry Guan  . UVULOPALATOPLASTY  2011    SOCIAL HISTORY: Social History   Socioeconomic History  . Marital status: Married    Spouse name: Not on file  . Number of children: Not on file  . Years of education: Not on file  . Highest education level: Not on file  Occupational History  . Not on file  Social Needs  . Financial resource strain: Not hard at all  . Food insecurity:    Worry: Never true    Inability: Never true  . Transportation needs:    Medical: No    Non-medical: No  Tobacco Use  . Smoking status: Never Smoker  . Smokeless tobacco: Never Used  Substance and Sexual Activity  . Alcohol use: No  . Drug use: No  . Sexual activity: Yes    Partners: Female  Lifestyle  . Physical activity:    Days per week: Not on file    Minutes per session: Not on file  . Stress: Not on file  Relationships  . Social connections:    Talks on phone: Not on file    Gets together: Not on file    Attends religious service: Not on file    Active member of club or organization: Not on file    Attends meetings of clubs or organizations: Not on file    Relationship status: Not on file  . Intimate partner violence:    Fear of current or ex partner: No    Emotionally abused: No    Physically abused: No    Forced sexual activity: No  Other Topics Concern  . Not on file  Social History Narrative   Lives in  [place] with wife, [place]. 2 sons, William Hamburger, Lennette Bihari 22.. 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 Dementia Mother 31  . Heart disease Father 28  . Diabetes Father   . Lymphoma Sister        lymphoma, stage 4  . Ovarian cancer Sister 64       Ovarian  . Lupus Sister   . Prostate cancer Maternal Uncle   . Skin cancer Maternal Uncle   . Tongue cancer Maternal Uncle        uncle died of  heart attack  . Alcoholism Maternal Uncle   . Lung cancer Paternal Aunt        pat aunts x 4 died of lung cancer  . Lung cancer Paternal Aunt   . Lung cancer Paternal Aunt   . Lung cancer Paternal Aunt   . Mesothelioma Cousin        paternal cousin    ALLERGIES:  is allergic to altace [ramipril]; levaquin [levofloxacin in O3J]; metformin; trulicity [dulaglutide]; conray [iothalamate]; and lyrica [pregabalin].  MEDICATIONS:  Current Outpatient Medications  Medication Sig Dispense Refill  . acetaminophen (TYLENOL) 500 MG tablet Take 1,000 mg by mouth 3 (three) times daily as needed for moderate pain or headache.     . blood glucose meter kit and supplies KIT Dispense based on patient and insurance preference. Check once daily. ICD doing E11.9. 1 each 0  . denosumab (PROLIA) 60 MG/ML SOSY injection Inject 60 mg into the skin every 6 (six) months.    . diphenhydrAMINE (BENADRYL) 25 MG tablet Take 25 mg by mouth daily as needed for allergies.    . DULoxetine (CYMBALTA) 60 MG capsule TAKE ONE CAPSULE BY MOUTH DAILY 30 capsule 3  . furosemide (LASIX) 20 MG tablet TAKE 1 TABLET BY MOUTH ONCE DAILY 90 tablet 0  . linagliptin (TRADJENTA) 5 MG TABS tablet Take 1 tablet (5 mg total) by mouth daily. 30 tablet 3  . losartan (COZAAR) 100 MG  tablet TAKE 1 TABLET BY MOUTH DAILY 90 tablet 0  . methocarbamol (ROBAXIN) 750 MG tablet Take 1 tablet (750 mg total) by mouth every 6 (six) hours as needed for muscle spasms. 60 tablet 1  . metoprolol succinate (TOPROL-XL) 50 MG 24 hr tablet TAKE 1 TABLET BY MOUTH WITH OR IMMEDIATLY FOLLOWING A MEAL (Patient taking differently: Take 50 mg by mouth daily. ) 30 tablet 6  . nystatin cream (MYCOSTATIN) Apply 1 application topically 2 (two) times daily. (Patient taking differently: Apply 1 application topically 2 (two) times daily as needed (dermatitis). ) 30 g 0  . nystatin-triamcinolone ointment (MYCOLOG) Apply 1 application topically 2 (two) times daily. 30 g 0  . esomeprazole (NEXIUM) 40 MG capsule TAKE 1 CAPSULE(40 MG) BY MOUTH DAILY 30 capsule 0  . Multiple Vitamin (MULTIVITAMIN) capsule Take 1 capsule by mouth daily. With copper and zinc    . rosuvastatin (CRESTOR) 20 MG tablet TAKE 1 TABLET BY MOUTH ONCE DAILY 30 tablet 0  . sucralfate (CARAFATE) 1 g tablet TAKE 1 TABLET BY MOUTH FOUR TIMES DAILY 120 tablet 0   No current facility-administered medications for this visit.       Marland Kitchen  PHYSICAL EXAMINATION: ECOG PERFORMANCE STATUS: 1 - Symptomatic but completely ambulatory  Vitals:   07/05/18 1107  BP: (!) 164/109  Pulse: 82  Resp: (!) 22  Temp: 99.3 F (37.4 C)   Filed Weights   07/05/18 1107  Weight: 182 lb (82.6 kg)    Physical Exam  Constitutional: He is oriented to person, place, and time and well-developed, well-nourished, and in no distress.  HENT:  Head: Normocephalic and atraumatic.  Eyes: Pupils are equal, round, and reactive to light. EOM are normal.  Neck: Normal range of motion. Neck supple.  Cardiovascular: Normal rate, regular rhythm, normal heart sounds and intact distal pulses.  Pulmonary/Chest: Effort normal and breath sounds normal.  Abdominal: Soft. Bowel sounds are normal.  Musculoskeletal: Normal range of motion.  Neurological: He is alert and oriented to  person, place, and time. Gait normal.  Skin:  Skin is warm.  Psychiatric: Affect normal.     LABORATORY DATA:  I have reviewed the data as listed Lab Results  Component Value Date   WBC 9.9 06/05/2018   HGB 15.0 06/05/2018   HCT 45.8 06/05/2018   MCV 103.9 (H) 06/05/2018   PLT 266.0 06/05/2018   Recent Labs    11/02/17 1541 12/09/17 1619 04/17/18 1142  NA 140 138 142  K 4.3 4.5 4.6  CL 104 103 105  CO2 26 24 25   GLUCOSE 180* 171* 117*  BUN 27* 28* 29*  CREATININE 1.26 1.26* 1.36*  CALCIUM 10.1 10.1 9.2  GFRNONAA  --  58* 52*  GFRAA  --  >60 >60  PROT 7.4 8.0  --   ALBUMIN 4.2 4.7  --   AST 29 31  --   ALT 49 48  --   ALKPHOS 50 42  --   BILITOT 0.4 0.5  --     RADIOGRAPHIC STUDIES: I have personally reviewed the radiological images as listed and agreed with the findings in the report. No results found.  ASSESSMENT & PLAN:   Macrocytosis without anemia #Mild macrocytosis without anemia-unclear etiology.  Question mild/slight low copper levels.  Discussed with the patient that this should be clinically not significant not limited to fatigue.  Do not suspect MDS as a bone marrow biopsy was negative.  #Chronic back pain-status post surgery clinically stable.  #Continued chronic fatigue stable-unclear etiology.  Defer to PCP.  # recommend Copper 85m/day [nature food'sOTC]  # DISPOSITION; # follow up with PCP/with [place] needed-Dr.B      GCammie Sickle MD 07/11/2018 8:01 PM

## 2018-07-05 NOTE — Assessment & Plan Note (Addendum)
#  Mild macrocytosis without anemia-unclear etiology.  Question mild/slight low copper levels.  Discussed with the patient that this should be clinically not significant not limited to fatigue.  Do not suspect MDS as a bone marrow biopsy was negative.  #Chronic back pain-status post surgery clinically stable.  #Continued chronic fatigue stable-unclear etiology.  Defer to PCP.  # recommend Copper 72m/day [nature food'sOTC]  # DISPOSITION; # follow up with PCP/with u[place] needed-Dr.B

## 2018-07-05 NOTE — Assessment & Plan Note (Deleted)
#  Weight loss-unclear etiology stable over the last few months.  Extensive work-up including CT scan chest and pelvis negative for any obvious malignancy.  Bone marrow biopsy also negative for any obvious malignancy.  Patient is okay to proceed with surgery from oncology standpoint.  #Chronic back pain-awaiting surgery with Dr. Julaine Fusi [facility].   #History of osteoporosis and anemia.  # skin rash- itch- anti-shitamine/ hydroctisone 10%; s/p dermatologist [Dr.Kowalski].  # follow up as needed.

## 2018-07-10 ENCOUNTER — Ambulatory Visit: Payer: Self-pay | Admitting: *Deleted

## 2018-07-10 ENCOUNTER — Telehealth: Payer: Self-pay

## 2018-07-10 NOTE — Telephone Encounter (Signed)
Copied from [place] [id number]. Topic: General - Other >> Jul 10, 2018 11:55 AM Vernona Rieger wrote: Reason for CRM: Patient would like Dr Caryl Bis to know that his oncologist has put him on a multi vitamin that contains zinc/copper.

## 2018-07-10 NOTE — Telephone Encounter (Signed)
Contacted pt regarding his symptoms; this has been going on for 3 weeks to a month; he says he has left ear pain, thick green nasal secretions, slight fever, facial pain/aching around cheeks, and body aches; the pt says that he has been doing sinus rinseshe says that he tested positive for staph in his nares prior to surgery 04/25/18 and was given antibiotics x 1 week prior to surgery; the pt would also like to let  He has had the symptoms off and on for years he states. He is requesting a antibiotic; the pt says that can be see on Monday 07/17/18 if necessary; he uses [facility], [place] [address] [place] [phone]; also the pt would also like for Dr Caryl Bis to know that the trdjenta for diabetes "Is not working"; he says his fasting blood sugar at 0800 07/11/18 was 134; his bedtime blood sugar has been as high as 250's and his A1C was 7.4; spoke with Tye [place] and she states that the pt has a 3 month follow up for his blood sugars; also the pt needs to be seen in the office for sinus issues; pt offered and accepted appointment with Dr Caryl Bis, [facility], per [facility]; 07/17/18 at 1515;  he was also advised to call back if his symptoms worsen; he verbalized understanding; will route to office for notification.      Reason for Disposition . [1] Using nasal washes and pain medicine > 24 hours AND [2] sinus pain (around cheekbone or eye) persists  Answer Assessment - Initial Assessment Questions 1. LOCATION: "Where does it hurt?"      Cheek are 2. ONSET: "When did the sinus pain start?"  (e.g., hours, days)      1 month ago 3. SEVERITY: "How bad is the pain?"   (Scale 1-10; mild, moderate or severe)   - MILD (1-3): doesn't interfere with normal activities    - MODERATE (4-7): interferes with normal activities (e.g., work or school) or awakens from sleep   - SEVERE (8-10): excruciating pain and  patient unable to do any normal activities        Rated 4 out of 10 4. RECURRENT SYMPTOM: "Have you ever had sinus problems before?" If so, ask: "When was the last time?" and "What happened that time?"      Yes recurrent sinus infections; sinus surgery 5. NASAL CONGESTION: "Is the nose blocked?" If so, ask, "Can you open it or must you breathe through the mouth?"     no 6. NASAL DISCHARGE: "Do you have discharge from your nose?" If so ask, "What color?"     Green discharge 7. FEVER: "Do you have a fever?" If so, ask: "What is it, how was it measured, and when did it start?"      99.7 07/10/18 at 0900 with digital oral thermometer 8. OTHER SYMPTOMS: "Do you have any other symptoms?" (e.g., sore throat, cough, earache, difficulty breathing)     Left ear pain, post nasal drainage  9. PREGNANCY: "Is there any chance you are pregnant?" "When was your last menstrual period?"     n/a  Protocols used: SINUS PAIN OR CONGESTION-A-AH

## 2018-07-10 NOTE — Telephone Encounter (Signed)
Patient will not see anyone but PCP scheduled for Monday the 9TH advised patient he is running fever , he really needs to be seen before that time patient refused.

## 2018-07-11 MED ORDER — MULTIVITAMINS PO CAPS: 1.0000 | ORAL_CAPSULE | Freq: Every day | ORAL | Status: AC

## 2018-07-11 NOTE — Telephone Encounter (Signed)
Noted  

## 2018-07-11 NOTE — Telephone Encounter (Signed)
Medication added to patient med list. Sent to PCP as an [place].

## 2018-07-11 NOTE — Addendum Note (Signed)
Addended by: Myriam Forehand on: 07/11/2018 12:33 PM   Modules accepted: Orders

## 2018-07-13 ENCOUNTER — Telehealth: Payer: Self-pay | Admitting: Family Medicine

## 2018-07-13 ENCOUNTER — Other Ambulatory Visit: Payer: Self-pay

## 2018-07-13 DIAGNOSIS — E119 Type 2 diabetes mellitus without complications: Secondary | ICD-10-CM

## 2018-07-13 DIAGNOSIS — I10 Essential (primary) hypertension: Secondary | ICD-10-CM

## 2018-07-13 DIAGNOSIS — R600 Localized edema: Secondary | ICD-10-CM

## 2018-07-13 MED ORDER — SUCRALFATE 1 G PO TABS: 1.0000 g | ORAL_TABLET | Freq: Four times a day (QID) | ORAL | 0 refills | Status: DC

## 2018-07-13 MED ORDER — DULOXETINE HCL 60 MG PO CPEP: 60.0000 mg | ORAL_CAPSULE | Freq: Every day | ORAL | 3 refills | Status: DC

## 2018-07-13 MED ORDER — ROSUVASTATIN CALCIUM 20 MG PO TABS: 20.0000 mg | ORAL_TABLET | Freq: Every day | ORAL | 0 refills | Status: DC

## 2018-07-13 MED ORDER — LOSARTAN POTASSIUM 100 MG PO TABS: 100.0000 mg | ORAL_TABLET | Freq: Every day | ORAL | 0 refills | Status: DC

## 2018-07-13 MED ORDER — FUROSEMIDE 20 MG PO TABS: 20.0000 mg | ORAL_TABLET | Freq: Every day | ORAL | 0 refills | Status: DC

## 2018-07-13 MED ORDER — METOPROLOL SUCCINATE ER 50 MG PO TB24: ORAL_TABLET | ORAL | 6 refills | Status: DC

## 2018-07-13 MED ORDER — LINAGLIPTIN 5 MG PO TABS: 5.0000 mg | ORAL_TABLET | Freq: Every day | ORAL | 3 refills | Status: DC

## 2018-07-13 NOTE — Telephone Encounter (Signed)
Copied from [place] [id number]. Topic: Quick Communication - Rx Refill/Question >> Jul 13, 2018 11:25 AM Andria Frames L wrote: Medication: sucralfate (CARAFATE) 1 g tablet [[id number]] rosuvastatin (CRESTOR) 20 MG tablet [[id number]] esomeprazole (NEXIUM) 40 MG capsule [[id number]] DULoxetine (CYMBALTA) 60 MG capsule [[id number]] furosemide (LASIX) 20 MG tablet [[id number]] linagliptin (TRADJENTA) 5 MG TABS tablet [[id number]] losartan (COZAAR) 100 MG tablet [[id number]] metoprolol succinate (TOPROL-XL) 50 MG 24 hr tablet [[id number]] pt called and stated that [facility]. Pt stated that he called [facility] a weeks ago and they have not transferred medications yet.     Has the patient contacted their pharmacy? Yes  Preferred Pharmacy (with phone number or street name):[facility] [place], [place] - [place] [phone] (Phone) [phone] (Fax)    Agent: Please be advised that RX refills may take up to 3 business days. We ask that you follow-up with your pharmacy.

## 2018-07-13 NOTE — Telephone Encounter (Signed)
I have called patient to let him know that I have sent prescriptions to [facility].

## 2018-07-14 ENCOUNTER — Other Ambulatory Visit: Payer: Self-pay | Admitting: Family Medicine

## 2018-07-14 DIAGNOSIS — I10 Essential (primary) hypertension: Secondary | ICD-10-CM

## 2018-07-15 ENCOUNTER — Other Ambulatory Visit: Payer: Self-pay | Admitting: Family Medicine

## 2018-07-16 ENCOUNTER — Ambulatory Visit
Admission: RE | Admit: 2018-07-16 | Discharge: 2018-07-16 | Disposition: A | Discharge status: Home / Self Care | Payer: Medicare Other | Source: Ambulatory Visit | Attending: Neurosurgery | Admitting: Neurosurgery

## 2018-07-16 DIAGNOSIS — M5440 Lumbago with sciatica, unspecified side: Secondary | ICD-10-CM | POA: Diagnosis not present

## 2018-07-16 DIAGNOSIS — M47816 Spondylosis without myelopathy or radiculopathy, lumbar region: Secondary | ICD-10-CM | POA: Diagnosis not present

## 2018-07-16 DIAGNOSIS — M48061 Spinal stenosis, lumbar region without neurogenic claudication: Secondary | ICD-10-CM | POA: Insufficient documentation

## 2018-07-16 LAB — POCT I-STAT CREATININE: Creatinine, Ser: 1.2 mg/dL (ref 0.61–1.24)

## 2018-07-16 MED ORDER — GADOBUTROL 1 MMOL/ML IV SOLN
8.0000 mL | Freq: Once | INTRAVENOUS | Status: AC | PRN
  Administered 2018-07-16: 8 mL via INTRAVENOUS

## 2018-07-17 ENCOUNTER — Ambulatory Visit (INDEPENDENT_AMBULATORY_CARE_PROVIDER_SITE_OTHER): Payer: Medicare Other | Admitting: Family Medicine

## 2018-07-17 ENCOUNTER — Encounter: Payer: Self-pay | Admitting: Family Medicine

## 2018-07-17 VITALS — BP 110/80 | Temp 98.6°F | Ht 66.5 in | Wt 182.0 lb

## 2018-07-17 DIAGNOSIS — E119 Type 2 diabetes mellitus without complications: Secondary | ICD-10-CM

## 2018-07-17 DIAGNOSIS — D7589 Other specified diseases of blood and blood-forming organs: Secondary | ICD-10-CM

## 2018-07-17 DIAGNOSIS — J0101 Acute recurrent maxillary sinusitis: Secondary | ICD-10-CM

## 2018-07-17 DIAGNOSIS — E559 Vitamin D deficiency, unspecified: Secondary | ICD-10-CM

## 2018-07-17 DIAGNOSIS — J329 Chronic sinusitis, unspecified: Secondary | ICD-10-CM | POA: Diagnosis not present

## 2018-07-17 DIAGNOSIS — M5136 Other intervertebral disc degeneration, lumbar region: Secondary | ICD-10-CM

## 2018-07-17 DIAGNOSIS — N644 Mastodynia: Secondary | ICD-10-CM

## 2018-07-17 DIAGNOSIS — M810 Age-related osteoporosis without current pathological fracture: Secondary | ICD-10-CM

## 2018-07-17 MED ORDER — AMOXICILLIN-POT CLAVULANATE 875-125 MG PO TABS: 1.0000 | ORAL_TABLET | Freq: Two times a day (BID) | ORAL | 0 refills | Status: DC

## 2018-07-17 NOTE — Progress Notes (Signed)
[name], MD Phone: [phone]  Justin Patel is a 67 y.o. male who presents today for follow-up.  CC: Sinusitis, diabetes, osteoporosis, loss of muscle mass  Sinusitis: Patient notes for the last 6 weeks or so he has had fairly significant maxillary and frontal sinus congestion with large amounts of green mucus and solid green chunks coming out of his nose.  Low-grade fever of 99-100 F.  This comes and goes.  No significant cough.  He does note some fatigue.  Has been using Flonase.  He notes some left ear discomfort.  Diabetes: Typically running 120-130 fasting though up to the 250s in the afternoon.  Taking Tradjenta.  He is not watching his food intake.  No polyuria or polydipsia.  No hypoglycemia.  Osteoporosis: Patient is due for Prolia injection on 12/13.  He is taking calcium and vitamin D.  Loss of muscle mass: Patient has noted loss of muscle mass in his legs and arms.  Notes this has been going on since his surgery.  He also notes skin sensitivity with fairly significant pain when touching the skin in his lower legs left greater than right.  He discussed with his oncologist as well as his neurosurgeon.  He saw his neurosurgeon on 07/17/2018.  He notes his neurosurgeon felt it may be a neurologically mediated issue.  They plan to follow-up with the neurosurgeon in 1 week.  He feels as though his muscles are weak as well.  He just had an MRI of his lumbar spine that revealed postsurgical changes and no acute issues.  Social History   Tobacco Use  Smoking Status Never Smoker  Smokeless Tobacco Never Used     ROS see history of present illness  Objective  Physical Exam Vitals:   07/17/18 1524  BP: 110/80  Temp: 98.6 F (37 C)  SpO2: 96%    BP Readings from Last 3 Encounters:  07/17/18 110/80  07/05/18 (!) 164/109  05/16/18 112/78   Wt Readings from Last 3 Encounters:  07/17/18 182 lb (82.6 kg)  07/05/18 182 lb (82.6 kg)  05/16/18 186 lb 3.2 oz (84.5  kg)    Physical Exam  Constitutional: No distress.  HENT:  Head: Normocephalic and atraumatic.  Postnasal drip noted, mild oropharyngeal erythema, clear fluid behind the right eardrum,, bilateral maxillary and frontal sinuses tender to percussion  Eyes: Pupils are equal, round, and reactive to light. Conjunctivae are normal.  Neck: Neck supple.  Cardiovascular: Normal rate, regular rhythm and normal heart sounds.  Pulmonary/Chest: Effort normal and breath sounds normal.  Musculoskeletal: He exhibits no edema.  Muscle mass does seem to be slightly decreased, he does have tenderness of the skin below his knee and is bilateral legs left greater than right  Lymphadenopathy:    He has no cervical adenopathy.  Neurological: He is alert.  5/5 strength in bilateral biceps, triceps, grip, quads, hamstrings, plantar and dorsiflexion, sensation to light touch intact in bilateral UE and LE, normal gait, 2+ patellar reflexes  Skin: Skin is warm and dry. He is not diaphoretic.     Assessment/Plan: Please see individual problem list.  Sinusitis Symptoms consistent with sinusitis.  We will treat with Augmentin.  Refer to ENT given recurrent sinusitis issues.  Diabetes (HCC) Check A1c.  Continue Tradjenta.  Breast pain in male Patient reports this resolved.  He had a benign mammogram previously.  Vitamin D deficiency Check vitamin D.  Macrocytosis without anemia Patient has been evaluated by oncology.  Periodically recheck this.  Osteoporosis without current pathological fracture Continue Prolia.  Check vitamin D.  DDD (degenerative disc disease), lumbar Patient is status post fusion.  He has had a decrease in muscle mass though strength remains intact.  He is following with neurosurgery for this.  I discussed the possibility of nerve conduction studies versus possible neurology evaluation depending on how his follow-up goes with neurosurgery.  He would like to monitor for now and keep his  appointment with neurosurgery.   Orders Placed This Encounter  Procedures  . HgB A1c  . Vitamin D (25 hydroxy)  . Basic Metabolic Panel (BMET)  . Ambulatory referral to ENT    Referral Priority:   Routine    Referral Type:   Consultation    Referral Reason:   Specialty Services Required    Requested Specialty:   Otolaryngology    Number of Visits Requested:   1    Meds ordered this encounter  Medications  . amoxicillin-clavulanate (AUGMENTIN) 875-125 MG tablet    Sig: Take 1 tablet by mouth 2 (two) times daily.    Dispense:  14 tablet    Refill:  0     [name], MD [place]

## 2018-07-17 NOTE — Patient Instructions (Signed)
Nice to see you. We will check lab work today and contact you with the results. We will start you on Augmentin for your sinus infection and have you see ENT given recurrence of sinus infections. Please keep your appointment with your neurosurgeon.

## 2018-07-18 ENCOUNTER — Encounter: Payer: Self-pay | Admitting: Family Medicine

## 2018-07-18 LAB — BASIC METABOLIC PANEL
BUN: 23 mg/dL (ref 6–23)
CO2: 28 mEq/L (ref 19–32)
CREATININE: 1.17 mg/dL (ref 0.40–1.50)
Calcium: 9.7 mg/dL (ref 8.4–10.5)
Chloride: 104 mEq/L (ref 96–112)
GFR: 66 mL/min (ref >60.00)
Glucose, Bld: 139 mg/dL — ABNORMAL HIGH (ref 70–99)
Potassium: 4.6 mEq/L (ref 3.5–5.1)
Sodium: 141 mEq/L (ref 135–145)

## 2018-07-18 LAB — VITAMIN D 25 HYDROXY (VIT D DEFICIENCY, FRACTURES): VITD: 23.25 ng/mL — AB (ref 30.00–100.00)

## 2018-07-18 LAB — HEMOGLOBIN A1C: HEMOGLOBIN A1C: 6.7 % — AB (ref 4.6–6.5)

## 2018-07-18 NOTE — Assessment & Plan Note (Signed)
Continue Prolia.  Check vitamin D. 

## 2018-07-18 NOTE — Assessment & Plan Note (Signed)
Check vitamin D. 

## 2018-07-18 NOTE — Assessment & Plan Note (Signed)
Check A1c.  Continue Tradjenta. 

## 2018-07-18 NOTE — Assessment & Plan Note (Signed)
Patient reports this resolved.  He had a benign mammogram previously.

## 2018-07-18 NOTE — Assessment & Plan Note (Signed)
Patient has been evaluated by oncology.  Periodically recheck this.

## 2018-07-18 NOTE — Assessment & Plan Note (Signed)
Symptoms consistent with sinusitis.  We will treat with Augmentin.  Refer to ENT given recurrent sinusitis issues.

## 2018-07-18 NOTE — Assessment & Plan Note (Signed)
Patient is status post fusion.  He has had a decrease in muscle mass though strength remains intact.  He is following with neurosurgery for this.  I discussed the possibility of nerve conduction studies versus possible neurology evaluation depending on how his follow-up goes with neurosurgery.  He would like to monitor for now and keep his appointment with neurosurgery.

## 2018-07-20 ENCOUNTER — Ambulatory Visit (INDEPENDENT_AMBULATORY_CARE_PROVIDER_SITE_OTHER): Payer: Medicare Other

## 2018-07-20 ENCOUNTER — Other Ambulatory Visit: Payer: Self-pay | Admitting: Family Medicine

## 2018-07-20 DIAGNOSIS — M810 Age-related osteoporosis without current pathological fracture: Secondary | ICD-10-CM | POA: Diagnosis not present

## 2018-07-20 DIAGNOSIS — E559 Vitamin D deficiency, unspecified: Secondary | ICD-10-CM

## 2018-07-20 MED ORDER — VITAMIN D (ERGOCALCIFEROL) 1.25 MG (50000 UNIT) PO CAPS: 50000.0000 [IU] | ORAL_CAPSULE | ORAL | 0 refills | Status: DC

## 2018-07-20 MED ORDER — DENOSUMAB 60 MG/ML ~~LOC~~ SOSY
60.0000 mg | PREFILLED_SYRINGE | Freq: Once | SUBCUTANEOUS | Status: AC
  Administered 2018-07-20: 60 mg via SUBCUTANEOUS

## 2018-07-20 NOTE — Progress Notes (Signed)
Pt is here today for Prolia inject given SQ in the LA. Pt tolerated well. Pt advised to get next shot 6 months from now.

## 2018-07-21 ENCOUNTER — Other Ambulatory Visit: Payer: Self-pay

## 2018-07-21 DIAGNOSIS — R600 Localized edema: Secondary | ICD-10-CM

## 2018-07-21 DIAGNOSIS — E119 Type 2 diabetes mellitus without complications: Secondary | ICD-10-CM

## 2018-07-21 DIAGNOSIS — I10 Essential (primary) hypertension: Secondary | ICD-10-CM

## 2018-07-21 MED ORDER — FUROSEMIDE 20 MG PO TABS: 20.0000 mg | ORAL_TABLET | Freq: Every day | ORAL | 0 refills | Status: DC

## 2018-07-21 MED ORDER — DULOXETINE HCL 60 MG PO CPEP: 60.0000 mg | ORAL_CAPSULE | Freq: Every day | ORAL | 3 refills | Status: DC

## 2018-07-21 MED ORDER — ROSUVASTATIN CALCIUM 20 MG PO TABS: 20.0000 mg | ORAL_TABLET | Freq: Every day | ORAL | 0 refills | Status: DC

## 2018-07-21 MED ORDER — METOPROLOL SUCCINATE ER 50 MG PO TB24: ORAL_TABLET | ORAL | 6 refills | Status: DC

## 2018-07-21 MED ORDER — SUCRALFATE 1 G PO TABS: 1.0000 g | ORAL_TABLET | Freq: Four times a day (QID) | ORAL | 0 refills | Status: DC

## 2018-07-21 MED ORDER — LOSARTAN POTASSIUM 100 MG PO TABS: 100.0000 mg | ORAL_TABLET | Freq: Every day | ORAL | 0 refills | Status: DC

## 2018-07-21 MED ORDER — LINAGLIPTIN 5 MG PO TABS: 5.0000 mg | ORAL_TABLET | Freq: Every day | ORAL | 3 refills | Status: DC

## 2018-07-21 NOTE — Telephone Encounter (Signed)
I have sent to [facility] for patient.

## 2018-07-21 NOTE — Telephone Encounter (Signed)
Justin Patel with [facility] calling and states that they have not received these medications. States that last week, their e-scribe was down. Would like to know if these could be resent. Please advise.

## 2018-07-24 ENCOUNTER — Other Ambulatory Visit: Payer: Self-pay

## 2018-07-24 MED ORDER — ESOMEPRAZOLE MAGNESIUM 40 MG PO CPDR: DELAYED_RELEASE_CAPSULE | ORAL | 1 refills | Status: DC

## 2018-07-24 NOTE — Telephone Encounter (Signed)
Fax from [facility]   Refill request for esomeprazole 40 MG 90 day supply   Rx has been sent.

## 2018-07-27 ENCOUNTER — Telehealth: Payer: Self-pay | Admitting: *Deleted

## 2018-07-27 NOTE — Telephone Encounter (Signed)
Sent to PCP to advise 

## 2018-07-27 NOTE — Telephone Encounter (Signed)
Copied from [place] [phone]. Topic: Referral - Status >> Jul 27, 2018  1:30 PM Vernona Rieger wrote: Reason for CRM: Patient is calling to check and see if Dr Caryl Bis still wanted him to see Dr Metta Clines, neurologist for the muscle loss in his legs. He said that he can call and schedule if that's what Dr Budd Palmer prefers. He said that you can message him on mychart if you would like too. Please Advise.

## 2018-07-27 NOTE — Telephone Encounter (Signed)
When I last spoke to the patient he was going to see his neurosurgeon for follow-up and then determine the next step.  Please see if he saw his neurosurgeon.  I do think it would be worthwhile having him see a neurologist particularly if the neurosurgeon did not proceed with any evaluation.

## 2018-07-28 NOTE — Telephone Encounter (Signed)
Called and spoke with patient. Pt advised and voiced understanding. Pt stated that he will set up his appt with nurology since he is already an established patient there

## 2018-07-28 NOTE — Telephone Encounter (Signed)
Called and spoke with patient. Pt advised and voiced understanding. Pt does have an appt with neurosurgeon on 08/28/2018.   Pt stated that he is just concern bc he is is losing muscle mass and just losing his strength. Plus he still is noticing the patiki rashes along with other "red line like rashes all over his body that look like cuts", pt stated  He understands that lab work did not really explain anything and Dr. Caryl Bis did a complete work-up with the patient.

## 2018-07-28 NOTE — Telephone Encounter (Signed)
Noted.  I think it probably would be a good idea to have him see neurology.  If the patient needs [place] to help arrange this please let me know.  He also may benefit from follow-up with dermatology given his continued rash.
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
## 2018-08-01 ENCOUNTER — Other Ambulatory Visit (INDEPENDENT_AMBULATORY_CARE_PROVIDER_SITE_OTHER): Payer: Medicare Other

## 2018-08-01 ENCOUNTER — Ambulatory Visit: Payer: Medicare Other | Admitting: Neurology

## 2018-08-01 ENCOUNTER — Encounter: Payer: Self-pay | Admitting: Neurology

## 2018-08-01 ENCOUNTER — Other Ambulatory Visit: Payer: Self-pay

## 2018-08-01 VITALS — BP 124/70 | HR 85 | Ht 66.0 in | Wt 181.0 lb

## 2018-08-01 DIAGNOSIS — R202 Paresthesia of skin: Secondary | ICD-10-CM

## 2018-08-01 DIAGNOSIS — M6281 Muscle weakness (generalized): Secondary | ICD-10-CM

## 2018-08-01 DIAGNOSIS — M4126 Other idiopathic scoliosis, lumbar region: Secondary | ICD-10-CM

## 2018-08-01 DIAGNOSIS — E1142 Type 2 diabetes mellitus with diabetic polyneuropathy: Secondary | ICD-10-CM | POA: Diagnosis not present

## 2018-08-01 DIAGNOSIS — R200 Anesthesia of skin: Secondary | ICD-10-CM

## 2018-08-01 NOTE — Patient Instructions (Addendum)
1.  We will check CK and aldolase  Your provider requests that you have LABS drawn today.  We share a lab with [facility] - they are located in [address] (second floor) of this building.  Once you get there, please have a seat and the phlebotomist will call your name.  If you have waited more than 15 minutes, please advise the front desk  2.  We will check MRI of cervical spine  We have sent a referral to [facility] for your MRI and they will call you directly to schedule your appt. They are located at [place]. If you need to contact them directly please call [phone].   3.  Follow up after testing.  More testing pending results.

## 2018-08-03 ENCOUNTER — Telehealth: Payer: Self-pay | Admitting: Neurology

## 2018-08-03 NOTE — Telephone Encounter (Signed)
Tiffany called from [employer] [facility] regarding some questions she had about tests ordered. Please Call. Thanks

## 2018-08-03 NOTE — Telephone Encounter (Signed)
Attempted to return call to [facility].  Received message stating "enter your ID followed by [facility]".  Received message x3, then call was disconnected.

## 2018-08-04 LAB — SPECIMEN STATUS REPORT

## 2018-08-06 LAB — CK: Total CK: 67 U/L (ref 24–204)

## 2018-08-06 LAB — SPECIMEN STATUS REPORT

## 2018-08-06 LAB — ALDOLASE: Aldolase: 2.7 U/L — ABNORMAL LOW (ref 3.3–10.3)

## 2018-08-07 ENCOUNTER — Telehealth: Payer: Self-pay | Admitting: Neurology

## 2018-08-07 DIAGNOSIS — M6281 Muscle weakness (generalized): Secondary | ICD-10-CM

## 2018-08-07 LAB — ALDOLASE: Aldolase: 4.3 U/L (ref <=8.1)

## 2018-08-07 LAB — CK: Total CK: 43 U/L — ABNORMAL LOW (ref 44–196)

## 2018-08-07 NOTE — Telephone Encounter (Signed)
Called and spoke with Pt, changed order to [facility] location, also transferred him to schedule EMG

## 2018-08-07 NOTE — Telephone Encounter (Signed)
Patient is calling about having MRI scheduled for [facility] imaging please not [facility]. Also, he was wanting to speak with someone in regards to recent lab work results. Please call him back at [phone]. Thanks!

## 2018-08-07 NOTE — Addendum Note (Signed)
Addended by: Clois Comber on: 08/07/2018 04:10 PM   Modules accepted: Orders

## 2018-08-14 ENCOUNTER — Telehealth: Payer: Self-pay | Admitting: Neurology

## 2018-08-14 NOTE — Telephone Encounter (Signed)
Called and spoke with Manuela Schwartz in centralized scheduling. She said there is no record of the Pt calling to schedule, but she will call him now. There are questions the Pt's must answer.

## 2018-08-14 NOTE — Telephone Encounter (Signed)
Patient has not been able to get the MRI sch at [facility] on [address] he wants to know if you will call and get it sch for him he can do it before noon on any day

## 2018-08-15 ENCOUNTER — Ambulatory Visit (INDEPENDENT_AMBULATORY_CARE_PROVIDER_SITE_OTHER): Payer: Medicare Other | Admitting: Neurology

## 2018-08-15 ENCOUNTER — Other Ambulatory Visit: Payer: Self-pay | Admitting: Family Medicine

## 2018-08-15 DIAGNOSIS — M6281 Muscle weakness (generalized): Secondary | ICD-10-CM | POA: Diagnosis not present

## 2018-08-15 DIAGNOSIS — E119 Type 2 diabetes mellitus without complications: Secondary | ICD-10-CM

## 2018-08-15 NOTE — Procedures (Signed)
[facility]  [address]  [place], [place] [address] Tel: [phone] Fax:  [phone] Test Date:  08/15/2018  Patient: Justin Patel DOB: [birth year] Physician: Narda Amber, DO  Sex: Male Height: 5\' 6"  Ref Phys: Metta Clines, DO  ID#: [id number] Temp: 34.0C Technician:    Patient Complaints: This is a 68 year old man with diabetes mellitus referred for evaluation of bilateral leg weakness.  NCV & EMG Findings: Extensive electrodiagnostic testing of the right lower extremity and additional studies of the left shows:  1. Bilateral sural and superficial peroneal sensory responses are within normal limits. 2. Left peroneal motor response is mildly reduced at the extensor digitorum brevis, and normal at the tibialis anterior.  Right peroneal and bilateral tibial motor responses are within normal limits.   3. Bilateral tibial H reflex studies are within normal limits.   4. 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       Impression: This is a normal study of the lower extremities.  In particular, there is no evidence of a large fiber sensorimotor polyneuropathy, lumbosacral radiculopathy, or diffuse myopathy.   ___________________________ Narda Amber, DO    Nerve Conduction Studies Anti Sensory Summary Table   Site NR Peak (ms) Norm Peak (ms) P-T Amp (V) Norm P-T Amp  Left Sup Peroneal Anti Sensory (Ant Lat Mall)  34C  12 cm    2.8 <4.6 14.8 >3  Right Sup Peroneal Anti Sensory (Ant Lat Mall)  34C  12 cm    2.8 <4.6 11.9 >3  Left Sural Anti Sensory (Lat Mall)  34C  Calf    3.4 <4.6 6.3 >3  Right Sural Anti Sensory (Lat Mall)  34C  Calf    2.6 <4.6 8.7 >3   Motor Summary Table   Site NR Onset (ms) Norm Onset (ms) O-P Amp (mV) Norm O-P Amp Site1 Site2 Delta-0 (ms) Dist (cm) Vel (m/s) Norm Vel (m/s)  Left Peroneal Motor (Ext Dig Brev)  34C  Ankle    4.2 <6.0 2.3 >2.5  B Fib Ankle 8.6 37.0 43 >40  B Fib    12.8  2.2  Poplt B Fib 1.3 8.0 62 >40  Poplt    14.1  1.9         Right Peroneal Motor (Ext Dig Brev)  34C  Ankle    2.7 <6.0 3.4 >2.5 B Fib Ankle 7.5 37.0 49 >40  B Fib    10.2  3.1  Poplt B Fib 1.3 8.0 62 >40  Poplt    11.5  3.1         Left Peroneal TA Motor (Tib Ant)  34C  Fib Head    3.4 <4.5 4.1 >3 Poplit Fib Head 1.4 8.0 57 >40  Poplit    4.8  3.9         Right Peroneal TA Motor (Tib Ant)  34C  Fib Head    2.8 <4.5 4.9 >3 Poplit Fib Head 1.3 7.0 54 >40  Poplit    4.1  4.8         Left Tibial Motor (Abd Hall Brev)  34C  Ankle    5.3 <6.0 5.7 >4 Knee Ankle 8.0 41.0 51 >40  Knee    13.3  4.7         Right Tibial Motor (Abd Hall Brev)  34C  Ankle    3.4 <6.0 4.7 >4 Knee Ankle 8.2 39.0  48 >40  Knee    11.6  4.3          H Reflex Studies   NR H-Lat (ms) Lat Norm (ms) L-R H-Lat (ms)  Left Tibial (Gastroc)  34C     33.20 <35 0.00  Right Tibial (Gastroc)  34C     33.20 <35 0.00   EMG   Side Muscle Ins Act Fibs Psw Fasc Number Recrt Dur Dur. Amp Amp. Poly Poly. Comment  Right AntTibialis Nml Nml Nml Nml Nml Nml Nml Nml Nml Nml Nml Nml N/A  Right Gastroc Nml Nml Nml Nml Nml Nml Nml Nml Nml Nml Nml Nml N/A  Right Flex Dig Long Nml Nml Nml Nml Nml Nml Nml Nml Nml Nml Nml Nml N/A  Right RectFemoris Nml Nml Nml Nml Nml Nml Nml Nml Nml Nml Nml Nml N/A  Right GluteusMed Nml Nml Nml Nml Nml Nml Nml Nml Nml Nml Nml Nml N/A  Right AdductorLong Nml Nml Nml Nml 1- Rapid Few 1+ Few 1+ Nml Nml N/A  Left Gastroc Nml Nml Nml Nml Nml Nml Nml Nml Nml Nml Nml Nml N/A  Left Flex Dig Long Nml Nml Nml Nml Nml Nml Nml Nml Nml Nml Nml Nml N/A  Left RectFemoris Nml Nml Nml Nml 1- Rapid Few 1+ Few 1+ Nml Nml N/A  Left GluteusMed Nml Nml Nml Nml Nml Nml Nml Nml Nml Nml Nml Nml N/A  Left AdductorLong Nml Nml Nml Nml Nml Nml Nml Nml Nml Nml Nml Nml N/A  Left AntTibialis Nml Nml Nml Nml Nml Nml Nml Nml Nml Nml Nml Nml N/A      Waveforms:

## 2018-08-17 ENCOUNTER — Telehealth: Payer: Self-pay | Admitting: Neurology

## 2018-08-17 ENCOUNTER — Telehealth: Payer: Self-pay

## 2018-08-17 NOTE — Telephone Encounter (Signed)
Called and advised Pt 

## 2018-08-17 NOTE — Telephone Encounter (Signed)
Patient wants to get the results of the EMG done please call

## 2018-08-17 NOTE — Telephone Encounter (Signed)
Called and advised Pt of EMG results.  

## 2018-08-17 NOTE — Telephone Encounter (Signed)
-----   Message from Alda Berthold, DO sent at 08/17/2018  8:02 AM EST ----- Please inform patient that his nerve testing is normal and did not show evidence of muscle or nerve injury.  Thanks.

## 2018-08-21 ENCOUNTER — Encounter: Payer: Self-pay | Admitting: Family Medicine

## 2018-08-21 ENCOUNTER — Ambulatory Visit (INDEPENDENT_AMBULATORY_CARE_PROVIDER_SITE_OTHER): Payer: Medicare Other

## 2018-08-21 ENCOUNTER — Ambulatory Visit: Payer: Medicare Other | Admitting: Family Medicine

## 2018-08-21 VITALS — BP 130/86 | HR 76 | Temp 98.6°F | Ht 66.0 in | Wt 183.8 lb

## 2018-08-21 DIAGNOSIS — Z9884 Bariatric surgery status: Secondary | ICD-10-CM | POA: Diagnosis not present

## 2018-08-21 DIAGNOSIS — R0609 Other forms of dyspnea: Secondary | ICD-10-CM

## 2018-08-21 DIAGNOSIS — R1031 Right lower quadrant pain: Secondary | ICD-10-CM

## 2018-08-21 DIAGNOSIS — F320 Major depressive disorder, single episode, mild: Secondary | ICD-10-CM

## 2018-08-21 DIAGNOSIS — R634 Abnormal weight loss: Secondary | ICD-10-CM

## 2018-08-21 DIAGNOSIS — I10 Essential (primary) hypertension: Secondary | ICD-10-CM | POA: Diagnosis not present

## 2018-08-21 DIAGNOSIS — M791 Myalgia, unspecified site: Secondary | ICD-10-CM | POA: Diagnosis not present

## 2018-08-21 DIAGNOSIS — M6281 Muscle weakness (generalized): Secondary | ICD-10-CM

## 2018-08-21 NOTE — Patient Instructions (Signed)
Nice to see you. We are going to have you return for fasting labs. Please start taking a vitamin B complex supplement. Please send me a picture of your multivitamin ingredients so I can determine if you are getting appropriate supplementation. If your breathing worsens please be evaluated.  If you develop chest pain please be evaluated.  If you feel your weakness worsens please be evaluated.

## 2018-08-21 NOTE — Progress Notes (Signed)
[name], MD Phone: [phone]  Justin Patel is a 68 y.o. male who presents today for follow-up.  CC: Decreased muscle mass, weight loss, hypertension, depression  Patient reports decreased muscle mass in his bilateral legs.  He notes significant pain with palpation of his legs and his arms now.  He notes he feels weak in his legs.  He notes the red spots on his legs have faded.  He underwent evaluation with neurology with nerve conduction studies which were normal.  He had labs which were unremarkable.  He has had a lumbar spine MRI which was unremarkable for cause.  He is going to have a cervical spine MRI later this week.  He has no neuropathy symptoms.  He feels physically wiped out with any kind of exertion.  He notes dyspnea on exertion over the last several months.  No chest pain.  No edema.  He has bowel movements daily.  He is urinating well.  He does note some lower abdominal soreness that has been going on for some time now.  He notes his weight has been stable.  His appetite is as good as it has been.  He is eating fairly well.  He does note some depression.  He is trying to stay active and busy in the afternoon.  He does have lots of positives in his life.  He is taking Cymbalta.  No suicidal ideation.  Social History   Tobacco Use  Smoking Status Never Smoker  Smokeless Tobacco Never Used     ROS see history of present illness  Objective  Physical Exam Vitals:   08/21/18 1316  BP: 130/86  Pulse: 76  Temp: 98.6 F (37 C)  SpO2: 100%    BP Readings from Last 3 Encounters:  08/21/18 130/86  08/01/18 124/70  07/17/18 110/80   Wt Readings from Last 3 Encounters:  08/21/18 183 lb 12.8 oz (83.4 kg)  08/01/18 181 lb (82.1 kg)  07/17/18 182 lb (82.6 kg)    Physical Exam Constitutional:      General: He is not in acute distress.    Appearance: He is not diaphoretic.  Cardiovascular:     Rate and Rhythm: Normal rate and regular rhythm.     Heart  sounds: Normal heart sounds.  Pulmonary:     Effort: Pulmonary effort is normal.     Breath sounds: Normal breath sounds.  Abdominal:     General: Bowel sounds are normal. There is no distension.     Palpations: Abdomen is soft.     Tenderness: There is abdominal tenderness (Mild tenderness lower abdomen). There is no guarding or rebound.  Musculoskeletal:     Comments: Bilateral legs and arms are tender to palpation  Skin:    General: Skin is warm and dry.  Neurological:     Mental Status: He is alert.     Comments: CN 2-12 intact, 5/5 strength in bilateral biceps, triceps, grip, quads, hamstrings, plantar and dorsiflexion, sensation to light touch intact in bilateral UE and LE, normal gait    EKG: Normal sinus rhythm, rate 70, no ischemic changes noted  Assessment/Plan: Please see individual problem list.  Essential hypertension, benign Adequately controlled.  Continue current regimen.  Abnormal weight loss Patient's weight is stable to slightly increased.  He will continue to eat well.  RLQ abdominal pain He has continued to have issues with abdominal discomfort.  Possibly musculoskeletal.  Prior evaluation with GI with no obvious cause.  CT scan previously with no  obvious cause with the exception of possible diverticulitis which was treated.  He underwent colonoscopy and EGD with no abnormalities noted.  He will monitor for now.  Could have him follow back up with GI if persisting.  Muscle weakness Patient with subjective muscle weakness and myalgias.  He has been evaluated by neurology and his evaluation thus far has been unremarkable.  He will complete the cervical spine MRI.  We will refer to rheumatology given his myalgias.  We will have our clinical pharmacist look at his medications to see if anything could be contributing to his symptoms.  We will check for several vitamin deficiencies given his history of gastric bypass.  Dyspnea on exertion Undetermined cause.  Could be  cardiac.  EKG is reassuring.  Lungs are clear.  Will obtain lab work as outlined below.  Likely refer to cardiology pending lab work.  Depression, major, single episode, mild (HCC) Mild.  He will continue Cymbalta.    Orders Placed This Encounter  Procedures  . DG Chest 2 View    Standing Status:   Future    Number of Occurrences:   1    Standing Expiration Date:   10/20/2019    Order Specific Question:   Reason for Exam (SYMPTOM  OR DIAGNOSIS REQUIRED)    Answer:   dyspnea, coarse breath sounds    Order Specific Question:   Preferred imaging location?    Answer:   [facility] Specific Question:   Radiology Contrast Protocol - do NOT remove file path    Answer:   [email or web address]  . Vitamin B1, whole blood    Standing Status:   Future    Number of Occurrences:   1    Standing Expiration Date:   08/22/2019  . Vitamin E    Standing Status:   Future    Number of Occurrences:   1    Standing Expiration Date:   08/22/2019  . CBC    Standing Status:   Future    Number of Occurrences:   1    Standing Expiration Date:   08/22/2019  . B Nat Peptide    Standing Status:   Future    Number of Occurrences:   1    Standing Expiration Date:   08/22/2019  . Comp Met (CMET)    Standing Status:   Future    Number of Occurrences:   1    Standing Expiration Date:   08/22/2019  . Ambulatory referral to Rheumatology    Referral Priority:   Routine    Referral Type:   Consultation    Referral Reason:   Specialty Services Required    Requested Specialty:   Rheumatology    Number of Visits Requested:   1  . EKG 12-Lead    No orders of the defined types were placed in this encounter.    [name], MD [place]

## 2018-08-22 ENCOUNTER — Encounter: Payer: Self-pay | Admitting: Family Medicine

## 2018-08-22 ENCOUNTER — Other Ambulatory Visit (INDEPENDENT_AMBULATORY_CARE_PROVIDER_SITE_OTHER): Payer: Medicare Other

## 2018-08-22 ENCOUNTER — Encounter: Payer: Medicare Other | Admitting: Neurology

## 2018-08-22 DIAGNOSIS — R0609 Other forms of dyspnea: Secondary | ICD-10-CM

## 2018-08-22 DIAGNOSIS — Z9884 Bariatric surgery status: Secondary | ICD-10-CM

## 2018-08-22 LAB — COMPREHENSIVE METABOLIC PANEL
ALBUMIN: 4.1 g/dL (ref 3.5–5.2)
ALT: 32 U/L (ref 0–53)
AST: 21 U/L (ref 0–37)
Alkaline Phosphatase: 45 U/L (ref 39–117)
BUN: 23 mg/dL (ref 6–23)
CO2: 24 mEq/L (ref 19–32)
Calcium: 9.6 mg/dL (ref 8.4–10.5)
Chloride: 106 mEq/L (ref 96–112)
Creatinine, Ser: 1.18 mg/dL (ref 0.40–1.50)
GFR: 65.34 mL/min (ref >60.00)
Glucose, Bld: 119 mg/dL — ABNORMAL HIGH (ref 70–99)
Potassium: 4.1 mEq/L (ref 3.5–5.1)
Sodium: 139 mEq/L (ref 135–145)
TOTAL PROTEIN: 7 g/dL (ref 6.0–8.3)
Total Bilirubin: 0.3 mg/dL (ref 0.2–1.2)

## 2018-08-22 LAB — CBC
HCT: 43.3 % (ref 39.0–52.0)
Hemoglobin: 14.4 g/dL (ref 13.0–17.0)
MCHC: 33.3 g/dL (ref 30.0–36.0)
MCV: 102.1 fl — ABNORMAL HIGH (ref 78.0–100.0)
Platelets: 208 10*3/uL (ref 150.0–400.0)
RBC: 4.24 Mil/uL (ref 4.22–5.81)
RDW: 13.6 % (ref 11.5–15.5)
WBC: 7.9 10*3/uL (ref 4.0–10.5)

## 2018-08-22 LAB — BRAIN NATRIURETIC PEPTIDE: PRO B NATRI PEPTIDE: 22 pg/mL (ref 0.0–100.0)

## 2018-08-23 ENCOUNTER — Telehealth: Payer: Self-pay

## 2018-08-23 ENCOUNTER — Encounter: Payer: Self-pay | Admitting: Family Medicine

## 2018-08-23 DIAGNOSIS — R0609 Other forms of dyspnea: Principal | ICD-10-CM

## 2018-08-23 LAB — TIQ- AMBIGUOUS ORDER

## 2018-08-23 NOTE — Telephone Encounter (Signed)
Pt called he wanted to know to know if the Prolia injection could be causing his leg pain?   Pt has had 3 injects so far.   Sent to PCP

## 2018-08-23 NOTE — Telephone Encounter (Signed)
This is a listed side effect of Prolia.  He should not get any additional injections of Prolia.  We will need to consider alternative treatments for his osteoporosis moving forward.

## 2018-08-23 NOTE — Telephone Encounter (Signed)
Copied from [place] [phone]. Topic: General - Other >> Aug 23, 2018 12:04 PM Windy Kalata wrote: Reason for CRM: Patient wanted message to go to [facility] that he was referred to Dr. Remi Haggard a Cardiologist.

## 2018-08-24 ENCOUNTER — Ambulatory Visit
Admission: RE | Admit: 2018-08-24 | Discharge: 2018-08-24 | Disposition: A | Discharge status: Home / Self Care | Payer: Medicare Other | Source: Ambulatory Visit | Attending: Neurology | Admitting: Neurology

## 2018-08-24 DIAGNOSIS — R0609 Other forms of dyspnea: Principal | ICD-10-CM | POA: Insufficient documentation

## 2018-08-24 DIAGNOSIS — M6281 Muscle weakness (generalized): Secondary | ICD-10-CM | POA: Insufficient documentation

## 2018-08-24 DIAGNOSIS — F320 Major depressive disorder, single episode, mild: Secondary | ICD-10-CM | POA: Insufficient documentation

## 2018-08-24 HISTORY — DX: Other forms of dyspnea: R06.09

## 2018-08-24 NOTE — Assessment & Plan Note (Signed)
He has continued to have issues with abdominal discomfort.  Possibly musculoskeletal.  Prior evaluation with GI with no obvious cause.  CT scan previously with no obvious cause with the exception of possible diverticulitis which was treated.  He underwent colonoscopy and EGD with no abnormalities noted.  He will monitor for now.  Could have him follow back up with GI if persisting.

## 2018-08-24 NOTE — Assessment & Plan Note (Signed)
Mild.  He will continue Cymbalta.

## 2018-08-24 NOTE — Assessment & Plan Note (Signed)
Patient's weight is stable to slightly increased.  He will continue to eat well.

## 2018-08-24 NOTE — Telephone Encounter (Signed)
Called patient and left a VM to call back. CRM created and sent to [facility] pool.  

## 2018-08-24 NOTE — Telephone Encounter (Signed)
Sent to PCP this is where he would like to see for the cardiology referral sent to PCP.

## 2018-08-24 NOTE — Assessment & Plan Note (Signed)
Adequately controlled.  Continue current regimen. 

## 2018-08-24 NOTE — Assessment & Plan Note (Signed)
Undetermined cause.  Could be cardiac.  EKG is reassuring.  Lungs are clear.  Will obtain lab work as outlined below.  Likely refer to cardiology pending lab work.

## 2018-08-24 NOTE — Telephone Encounter (Signed)
Called and spoke with pt. Pt advised and voiced understanding.  

## 2018-08-24 NOTE — Assessment & Plan Note (Signed)
>>  ASSESSMENT AND PLAN FOR DEPRESSION, MAJOR, SINGLE EPISODE, MILD (HCC) WRITTEN ON 08/24/2018 11:15 AM BY SONNENBERG, ERIC G, MD  Mild.  He will continue Cymbalta .

## 2018-08-24 NOTE — Assessment & Plan Note (Addendum)
Patient with subjective muscle weakness and myalgias.  He has been evaluated by neurology and his evaluation thus far has been unremarkable.  He will complete the cervical spine MRI.  We will refer to rheumatology given his myalgias.  We will have our clinical pharmacist look at his medications to see if anything could be contributing to his symptoms.  We will check for several vitamin deficiencies given his history of gastric bypass.

## 2018-08-24 NOTE — Telephone Encounter (Signed)
Called and spoke with patient. Pt advised and voiced understanding.  

## 2018-08-24 NOTE — Telephone Encounter (Signed)
I believe he likely means Dr. Fletcher Anon.  Based on review of his chart he has seen Dr. Fletcher Anon previously.  I placed a referral to him.

## 2018-08-25 ENCOUNTER — Telehealth: Payer: Self-pay

## 2018-08-25 ENCOUNTER — Telehealth: Payer: Self-pay | Admitting: Family Medicine

## 2018-08-25 DIAGNOSIS — M810 Age-related osteoporosis without current pathological fracture: Secondary | ICD-10-CM

## 2018-08-25 NOTE — Telephone Encounter (Signed)
-----   Message from De Hollingshead, [facility] sent at 08/25/2018 11:24 AM EST ----- Hi -  The timeline of when all of this started is difficult to ascertain. I definitely agree with holding on the Prolia for now.   Lady Gary also has an incidence of arthralgia, though I've only ever seen 1 patient potentially have that be a side effect. You could consider stopping the Tradjenta, seeing how symptoms go, and keeping an eye on blood sugars. Could also try restarting metformin XR at just 500 mg daily, since it looks like he had diarrhea with higher doses.   Duloxetine has a dose dependent incidence of weakness, tremor, musculoskeletal pain, but it would be highly unlikely for this to occur at just 60 mg daily, but a possibility?  Could also trial holding rosuvastatin? Though this is probably the least likely.   Catie  ----- Message ----- From: [name], MD Sent: 08/24/2018  11:16 AM EST To: De Hollingshead, [facility]  Hey Catie,   This is the guy I talked to you about on Monday. Can you look at his HPI and then look at his medications to see if any of his medications could be contributing to his symptoms? Thanks.   Randall Hiss

## 2018-08-25 NOTE — Telephone Encounter (Signed)
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  Specifically, no nerve impingement is seen. Thanks.

## 2018-08-25 NOTE — Telephone Encounter (Signed)
Called and spoke with Pt, discussed results. Pt stated he has an appt to see a rheumatologist.  He wanted to thank our office for being so kind and that seeing Dr. Tomi Likens was like visiting a friend.

## 2018-08-25 NOTE — Telephone Encounter (Signed)
Please let the patient know I heard back from our clinical pharmacist regarding his medication review.  She did agree with holding the Prolia.  We should no longer continue with that.  I will discontinue this in his chart.  She also mentioned there can be some issues with joint aches with trajenta though this would seem to be somewhat rare.  We could consider discontinuing this to see if his symptoms improved.  He would need to closely monitor his blood sugars.  I will forward to Juliann Pulse so that the Prolia is not reordered in the future.  I would suggest referral to rheumatology to consider alternative treatments for his osteoporosis.

## 2018-08-26 LAB — VITAMIN E
Gamma-Tocopherol (Vit E): <1 mg/L (ref <=4.3)
Vitamin E (Alpha Tocopherol): 9 mg/L (ref 5.7–19.9)

## 2018-08-26 LAB — VITAMIN B1: Vitamin B1 (Thiamine): 11 nmol/L (ref 8–30)

## 2018-08-28 NOTE — Telephone Encounter (Signed)
I will forward to Melissa to see if she can contact rheumatology to see if they can discuss his osteoporosis treatment during that visit.  Please check with her to make sure she gets this phone message.  Thanks.

## 2018-08-28 NOTE — Telephone Encounter (Signed)
There is an alternative, though he would need to be seen by rheumatology or endocrinology to receive it. I do not think the oral bisphosphonates are an option given his gastric bypass surgery. If he is willing I can place a referral.

## 2018-08-28 NOTE — Telephone Encounter (Signed)
Patient prefers to continue the trajenta he says that his pain started over a year ago and he has not been on the trajenta long enough to cause these symptoms.  Patient ask if there is an alternative to the Prolia he might be able to use.

## 2018-08-28 NOTE — Telephone Encounter (Signed)
Patient is scheduled to see rheumatology for the inflammation and petechiae on his legs 08/31/18 could this be added to that visit? Patient also wanted to inform PCP he saw Dr. Vertell Limber back surgeon and everything is fine and he does not have to see him for 2 months.

## 2018-08-29 NOTE — Telephone Encounter (Signed)
Checked with Lenna Sciara she wil take a lok at the message.

## 2018-08-30 NOTE — Addendum Note (Signed)
Addended by: Caryl Bis, Kyal Arts G on: 08/30/2018 12:12 PM   Modules accepted: Orders

## 2018-08-30 NOTE — Telephone Encounter (Signed)
Endocrinology referral placed.  Please let the patient know that I have been informed that rheumatology does not treat osteoporosis and he will need to see endocrinology for this.

## 2018-08-30 NOTE — Telephone Encounter (Signed)
Rheumatology does not treat osteoporosis. He will need to be referred to Endocrinology for this.

## 2018-08-30 NOTE — Telephone Encounter (Signed)
Called and spoke with patient. Pt advised and voiced understanding.  

## 2018-08-31 DIAGNOSIS — R29898 Other symptoms and signs involving the musculoskeletal system: Secondary | ICD-10-CM | POA: Insufficient documentation

## 2018-09-06 ENCOUNTER — Ambulatory Visit: Payer: Medicare Other | Admitting: Nurse Practitioner

## 2018-09-18 ENCOUNTER — Other Ambulatory Visit (INDEPENDENT_AMBULATORY_CARE_PROVIDER_SITE_OTHER): Payer: Medicare Other

## 2018-09-18 DIAGNOSIS — E559 Vitamin D deficiency, unspecified: Secondary | ICD-10-CM | POA: Diagnosis not present

## 2018-09-18 LAB — VITAMIN D 25 HYDROXY (VIT D DEFICIENCY, FRACTURES): VITD: 48.07 ng/mL (ref 30.00–100.00)

## 2018-09-19 ENCOUNTER — Other Ambulatory Visit: Payer: Self-pay | Admitting: Family Medicine

## 2018-09-19 ENCOUNTER — Encounter: Payer: Self-pay | Admitting: Family Medicine

## 2018-09-20 NOTE — Telephone Encounter (Signed)
Sent to PCP please advise.  

## 2018-09-29 ENCOUNTER — Ambulatory Visit: Payer: Medicare Other | Admitting: Nurse Practitioner

## 2018-10-01 NOTE — Progress Notes (Signed)
Cardiology Office Note Date:  10/02/2018  Patient ID:  Justin Patel, Justin Patel 1950/08/17, MRN [id number] PCP:  [name], MD  Cardiologist:  Dr. Fletcher Anon, MD    Chief Complaint: Dyspnea  History of Present Illness: Justin Patel is a 68 y.o. male with history of chronic diastolic CHF, presyncope versus seizure disorder, morbid obesity status post gastric bypass, iron deficiency anemia in the setting of prior gastric bypass, diabetes, hypertension, hyperlipidemia, anxiety, prior alcohol abuse, degenerative disc disease status post lumbar surgery in 04/2018 with intervention at L3-L2, L3-L4, and L5, cervical arthritis, and GERD who presents for evaluation of dyspnea on exertion.  Remote echo from 2015 showed an EF of 55 to 60%, severe concentric LVH, no regional wall motion abnormalities, grade 1 diastolic dysfunction, trivial mitral regurgitation, moderately dilated left atrium, mildly dilated right atrium.  Carotid artery ultrasound from 08/2014 showed 1 to 39% bilateral ICA stenosis.  Patient was last evaluated by Dr. Fletcher Anon in 04/2016 for follow-up of syncope versus seizures.  He did report some orthostatic dizziness that improved after decreasing Toprol and discontinuing hydralazine.  Around the same time he was also placed on Keppra.  At his last visit with cardiology, he denied any recurrent episodes of loss of consciousness.  He was recently evaluated by PCP on 08/21/2018 with multiple complaints including decreased muscle mass, weight loss, depression, and elevated BP readings.  He reported significant pain with palpation of his legs and arms as well as weakness in his legs.  He noted red spots on the legs that had since improved.  He reported evaluation by neurology with normal nerve conduction studies.  Labs were unrevealing, including normal CPK, aldolase, and TSH.  Lumbar spine MRI was unremarkable.  Cervical spine MRI showed mild changes.  He was evaluated by oncology with an  unrevealing bone marrow biopsy.  Biopsy of prior petechial rash showed lymphocytic inflammation.  He has since been evaluated by rheumatology without specific etiology being determined and referred to physical therapy.  He reported feeling physically wiped out with any kind of exertion and noted dyspnea on exertion over the past prior several months without chest pain or edema.  EKG checked at that time showed sinus rhythm with no acute ST-T changes.  Labs were checked which showed a BNP of 22, hemoglobin 14.4, potassium 4.1, serum creatinine 1.18, LFT normal.  Chest x-ray on 08/21/2018 showed mild left base pleural/parenchymal thickening consistent with scarring with no acute cardiopulmonary findings.  This was a stable exam compared to prior.  He was referred to cardiology for his dyspnea on exertion.  Patient comes in noting intermittent shortness of breath at rest that dates back to 04/2018.  He reports he feels significantly better when he is exerting himself.  He notes the shortness of breath will occur when sitting watching TV or laying in bed watching TV and last 1 to 2 minutes with spontaneous resolution.  There has been no associated chest pain, palpitations, dizziness, presyncope, or syncope.  Patient feels like his shortness of breath is related to underlying depression and malaise secondary to his underlying complaints in which definitive etiology has not been found.  He denies any lower extremity swelling, abdominal distention, orthopnea, PND, or early satiety.  Since he was last seen in 04/2016 his weight has decreased from 207 pounds to a current weight of 186 pounds.  Blood pressure remains well controlled at home.  He remains active with the caretaking of his 5 grandchildren.  No falls since  he was last seen by [place].  No BRBPR or melena.  He continues to note a rash along the bilateral lower extremities.  He reports the lower extremities and abdomen are intermittently exquisitely tender to touch.  He  denies any symptoms of claudication stating he actually feels better when he is exerting himself or walking through a store.  He is currently without cardiac complaint.  Past Medical History:  Diagnosis Date  . 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 Anxiety   . Arthritis   . 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  removal scheduled to clear margins.  . CHF (congestive heart failure) ([place])    no longer after weight loss  . Chronic kidney disease    cysts  . Degenerative disc disease   . Diabetes mellitus    no longer diabetic-or on meds  . Family history of adverse reaction to anesthesia    sons wake up combative  . Gastric ulcer   . GERD (gastroesophageal reflux disease)   . H/O hiatal hernia   . Headache(784.0)    sinus  . Heart murmur   . Hx of congestive heart failure   . Hyperlipidemia   . Hypertension   . Iron deficiency anemia 02/28/2015  . Opiate abuse, continuous ([place]) 06/20/2015  . Sacral fracture, closed ([place])   . Seizures ([place])    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, after GI bleed  . Sleep apnea    hx not now since wt loss  . Stones in the urinary tract   . Syncope and collapse     Past Surgical History:  Procedure Laterality Date  . ANTERIOR CERVICAL DECOMP/DISCECTOMY FUSION  01/26/2012   Procedure: ANTERIOR CERVICAL DECOMPRESSION/DISCECTOMY FUSION 3 LEVELS;  Surgeon: Floyce Stakes, MD;  Location: [facility];  Service: Neurosurgery;  Laterality: N/A;  Cervical three-four Cervical four-five Cervical five-six Cervical six-seven , Anterior cervical decompression/diskectomy, fusion, plate  . ANTERIOR LAT LUMBAR FUSION Right 04/25/2018   Procedure: Right Lumbar two-three Lumbar three-four Lumbar four-five anterior  lateral interbody fusion  with posterior percutaneous pedicle screws;  Surgeon: Erline Levine, MD;  Location: [facility];  Service: Neurosurgery;  Laterality: Right;  . APPENDECTOMY    . [place], normal  . CARDIAC CATHETERIZATION     [place]  . COLONOSCOPY WITH PROPOFOL N/A 12/27/2017   Procedure: COLONOSCOPY WITH PROPOFOL;  Surgeon: Toledo, Benay Pike, MD;  Location: [facility];  Service: Gastroenterology;  Laterality: N/A;  . ESOPHAGOGASTRODUODENOSCOPY (EGD) WITH PROPOFOL N/A 12/27/2017   Procedure: ESOPHAGOGASTRODUODENOSCOPY (EGD) WITH PROPOFOL;  Surgeon: Toledo, Benay Pike, MD;  Location: [facility];  Service: Gastroenterology;  Laterality: N/A;  . GASTRIC BYPASS  2010   [place]  . HERNIA REPAIR  2000   hiatal  . JOINT REPLACEMENT    . KNEE ARTHROSCOPY     bilateral, left x 2  . LUMBAR PERCUTANEOUS PEDICLE SCREW 3 LEVEL N/A 04/25/2018   Procedure: LUMBAR PERCUTANEOUS PEDICLE SCREW 3 LEVEL;  Surgeon: Erline Levine, MD;  Location: [facility];  Service: Neurosurgery;  Laterality: N/A;  . NASAL SINUS SURGERY     x5  . OSTEOTOMY  2001   left  . TONSILLECTOMY    . TOTAL KNEE ARTHROPLASTY Left 05/2014   Dr. Marry Guan  . UVULOPALATOPLASTY  2011    Current Meds  Medication Sig  . acetaminophen (TYLENOL) 500 MG tablet Take 1,000 mg by  mouth 3 (three) times daily as needed for moderate pain or headache.   . blood glucose meter kit and supplies KIT Dispense based on patient and insurance preference. Check once daily. ICD doing E11.9.  . diphenhydrAMINE (BENADRYL) 25 MG tablet Take 25 mg by mouth daily as needed for allergies.  . DULoxetine (CYMBALTA) 60 MG capsule TAKE ONE CAPSULE BY MOUTH DAILY  . esomeprazole (NEXIUM) 40 MG capsule TAKE 1 CAPSULE(40 MG) BY MOUTH DAILY  . furosemide (LASIX) 20 MG tablet TAKE 1 TABLET BY MOUTH ONCE DAILY  . linagliptin (TRADJENTA) 5 MG TABS tablet Take 1 tablet (5 mg total) by mouth daily.  Marland Kitchen losartan (COZAAR) 100 MG tablet TAKE 1 TABLET BY MOUTH DAILY  . metoprolol succinate (TOPROL-XL) 50 MG 24 hr tablet TAKE 1 TABLET BY MOUTH WITH OR IMMEDIATLY FOLLOWING A MEAL  . Multiple Vitamin (MULTIVITAMIN)  capsule Take 1 capsule by mouth daily. With copper and zinc  . nystatin cream (MYCOSTATIN) Apply 1 application topically 2 (two) times daily. (Patient taking differently: Apply 1 application topically 2 (two) times daily as needed (dermatitis). )  . nystatin-triamcinolone ointment (MYCOLOG) Apply 1 application topically 2 (two) times daily.  . rosuvastatin (CRESTOR) 20 MG tablet TAKE 1 TABLET BY MOUTH DAILY  . sucralfate (CARAFATE) 1 g tablet TAKE 1 TABLET BY MOUTH 4 TIMES DAILY  . traMADol (ULTRAM) 50 MG tablet Take by mouth every 6 (six) hours as needed. Take every 6 hours ( 4 times daily) as needed for pain  . Vitamin D, Ergocalciferol, (DRISDOL) 1.25 MG (50000 UT) CAPS capsule TAKE 1 CAPSULE BY MOUTH EVERY 7 DAYS    Allergies:   Altace [ramipril]; Levaquin [levofloxacin in d5w]; Metformin; Levofloxacin; Trulicity [dulaglutide]; Conray [iothalamate]; and Lyrica [pregabalin]   Social History:  The patient  reports that he has never smoked. He has never used smokeless tobacco. He reports that he does not drink alcohol or use drugs.   Family History:  The patient's family history includes Alcoholism in his maternal uncle; Dementia (age of onset: 47) in his mother; Diabetes in his father; Heart disease (age of onset: 46) in his father; Lung cancer in his paternal aunt, paternal aunt, paternal aunt, and paternal aunt; Lupus in his sister; Lymphoma in his sister; Mesothelioma in his cousin; Ovarian cancer (age of onset: 55) in his sister; Prostate cancer in his maternal uncle; Skin cancer in his maternal uncle; Tongue cancer in his maternal uncle.  ROS:   Review of Systems  Constitutional: Positive for malaise/fatigue. Negative for chills, diaphoresis, fever and weight loss.  HENT: Negative for congestion.   Eyes: Negative for discharge and redness.  Respiratory: Positive for shortness of breath. Negative for cough, hemoptysis, sputum production and wheezing.   Cardiovascular: Negative for chest  pain, palpitations, orthopnea, claudication, leg swelling and PND.  Gastrointestinal: Negative for abdominal pain, blood in stool, heartburn, melena, nausea and vomiting.  Genitourinary: Negative for hematuria.  Musculoskeletal: Positive for back pain, joint pain, myalgias and neck pain. Negative for falls.  Skin: Positive for rash.  Neurological: Positive for weakness. Negative for dizziness, tingling, tremors, sensory change, speech change, focal weakness and loss of consciousness.  Endo/Heme/Allergies: Does not bruise/bleed easily.  Psychiatric/Behavioral: Negative for substance abuse. The patient is not nervous/anxious.   All other systems reviewed and are negative.    PHYSICAL EXAM:  VS:  BP 106/70 (BP Location: Left Arm, Patient Position: Sitting, Cuff Size: Normal)   Pulse 64   Ht _0  (1.676 m)   Wt  186 lb 12 oz (84.7 kg)   BMI 30.14 kg/m  BMI: Body mass index is 30.14 kg/m.  Physical Exam  Constitutional: He is oriented to person, place, and time. He appears well-developed and well-nourished.  HENT:  Head: Normocephalic and atraumatic.  Eyes: Right eye exhibits no discharge. Left eye exhibits no discharge.  Neck: Normal range of motion. No JVD present.  Cardiovascular: Normal rate, regular rhythm, S1 normal, S2 normal and normal heart sounds. Exam reveals no distant heart sounds, no friction rub, no midsystolic click and no opening snap.  No murmur heard. Pulses:      Posterior tibial pulses are 2+ on the right side and 2+ on the left side.  Pulmonary/Chest: Effort normal and breath sounds normal. No respiratory distress. He has no decreased breath sounds. He has no wheezes. He has no rales. He exhibits no tenderness.  Abdominal: Soft. He exhibits no distension. There is no abdominal tenderness.  Musculoskeletal:        General: No edema.  Neurological: He is alert and oriented to person, place, and time.  Skin: Skin is warm and dry. No cyanosis. Nails show no clubbing.    Psychiatric: He has a normal mood and affect. His speech is normal and behavior is normal. Judgment and thought content normal.     EKG:  Was ordered and interpreted by me today. Shows NSR, 64 bpm, underlying artifact along inferior leads making interpretation of these leads somewhat difficult, normal axis, no acute ST-T changes  Recent Labs: 11/02/2017: TSH 0.88 08/22/2018: ALT 32; BUN 23; Creatinine, Ser 1.18; Hemoglobin 14.4; Platelets 208.0; Potassium 4.1; Pro B Natriuretic peptide (BNP) 22.0; Sodium 139  No results found for requested labs within last 8760 hours.   CrCl cannot be calculated (Patient's most recent lab result is older than the maximum 21 days allowed.).   Wt Readings from Last 3 Encounters:  10/02/18 186 lb 12 oz (84.7 kg)  08/21/18 183 lb 12.8 oz (83.4 kg)  08/01/18 181 lb (82.1 kg)     Other studies reviewed: Additional studies/records reviewed today include: summarized above  ASSESSMENT AND PLAN:  1. Shortness of breath: Symptoms are relatively atypical for being cardiac in etiology in that they improve with exertion.  Recent CBC and BNP were unrevealing.  He does not appear volume overloaded.  Patient denies any symptoms concerning for angina.  Obtain transthoracic echocardiogram to evaluate LV systolic function, diastolic function, valvular function, and pulmonary arterial pressure.  If this is unrevealing, could consider cardiopulmonary stress testing to further evaluate functional status.  2. Chronic diastolic CHF: Appears well compensated and euvolemic.  Remains on low-dose Lasix.  Recent BMP showed stable renal function and potassium.  CHF education.  3. Orthostatic hypotension: No further symptoms of presyncope or syncope since discontinuing hydralazine and decreasing Toprol several years prior.  Recommend adequate hydration.  4. Morbid obesity: Status post gastric bypass.  5. Iron deficiency anemia: In the setting of prior gastric bypass.  Followed by  PCP.   Disposition: F/u with Dr. Fletcher Anon or an APP in 3 months  Current medicines are reviewed at length with the patient today.  The patient did not have any concerns regarding medicines.  Signed, Christell Faith, PA-C 10/02/2018 10:39 AM     [place] [address] [place] [address] [place], [place] [address] [phone]

## 2018-10-02 ENCOUNTER — Ambulatory Visit: Payer: Medicare Other | Admitting: Physician Assistant

## 2018-10-02 ENCOUNTER — Encounter: Payer: Self-pay | Admitting: Physician Assistant

## 2018-10-02 ENCOUNTER — Other Ambulatory Visit: Payer: Self-pay | Admitting: Family Medicine

## 2018-10-02 VITALS — BP 106/70 | HR 64 | Ht 66.0 in | Wt 186.8 lb

## 2018-10-02 DIAGNOSIS — R0602 Shortness of breath: Secondary | ICD-10-CM | POA: Diagnosis not present

## 2018-10-02 DIAGNOSIS — I5032 Chronic diastolic (congestive) heart failure: Secondary | ICD-10-CM | POA: Diagnosis not present

## 2018-10-02 DIAGNOSIS — R600 Localized edema: Secondary | ICD-10-CM

## 2018-10-02 DIAGNOSIS — Z9884 Bariatric surgery status: Secondary | ICD-10-CM

## 2018-10-02 DIAGNOSIS — I951 Orthostatic hypotension: Secondary | ICD-10-CM | POA: Diagnosis not present

## 2018-10-02 DIAGNOSIS — D509 Iron deficiency anemia, unspecified: Secondary | ICD-10-CM

## 2018-10-02 NOTE — Patient Instructions (Signed)
Medication Instructions:  Your physician recommends that you continue on your current medications as directed. Please refer to the Current Medication list given to you today.  If you need a refill on your cardiac medications before your next appointment, please call your pharmacy.   Lab work: None ordered  If you have labs (blood work) drawn today and your tests are completely normal, you will receive your results only by: Marland Kitchen MyChart Message (if you have MyChart) OR . A paper copy in the mail If you have any lab test that is abnormal or we need to change your treatment, we will call you to review the results.  Testing/Procedures: 1- ECHO  Echo  Please return to [facility] on ______________ at _______________ AM/PM for an Echocardiogram. Your physician has requested that you have an echocardiogram. Echocardiography is a painless test that uses sound waves to create images of your heart. It provides your doctor with information about the size and shape of your heart and how well your heart's chambers and valves are working. This procedure takes approximately one hour. There are no restrictions for this procedure. Please note; depending on visual quality an IV may need to be placed.    Follow-Up: At [facility], you and your health needs are our priority.  As part of our continuing mission to provide you with exceptional heart care, we have created designated Provider Care Teams.  These Care Teams include your primary Cardiologist (physician) and Advanced Practice Providers (APPs -  Physician Assistants and Nurse Practitioners) who all work together to provide you with the care you need, when you need it. You will need a follow up appointment in 3 months. You may see Dr. Fletcher Anon or Christell Faith, PA-C

## 2018-10-09 ENCOUNTER — Telehealth: Payer: Self-pay

## 2018-10-09 NOTE — Telephone Encounter (Signed)
Call patient Please reiterate they were happy to see him. I see he has a history of diverticulitis, I want to ensure he is safe and certainly no recurrence of diverticulitis. He would need CT abdomen to evaluate for diverticulitis  Also see Dr.  Caryl Bis in January and discussed this with him back.  If he declines appointment, please advise him strongly to notify us of any new or worsening symptoms

## 2018-10-09 NOTE — Telephone Encounter (Signed)
Copied from [place] [id number]. Topic: General - Other >> Oct 09, 2018  3:49 PM Mcneil, Ja-Kwan wrote: Reason for CRM: Pt stated he has been experiencing sharp pain in the lower right abdominal area but now it is spreading all the way across from the top of his right groin to his left groin and he would like to make Dr. Caryl Bis aware. Pt has an appt on 10/23/18 at 1:45 pm and declined to see another provider. Pt requests that a message making Dr. Caryl Bis aware of the issue. Pt requests call back. Cb# [phone]

## 2018-10-09 NOTE — Telephone Encounter (Signed)
Called and spoke to patient. Patient said that he spoke to Dr.Sonneberg about abdominal tenderness previously.  Patient is complaining of sharp pain in right lower abdomen radiating to the left near the groin area. States that this has been going on for the last couple of weeks.  Denies any other acute symptoms. No fever, nausea, vomiting or diarrhea.  Offered patient appt prior to visit with Dr. Biagio Quint. Stated he would like to wait.  Advised that PCP is out of the office and will route to covering provider.  Will let pt know if he needs to be seen sooner.  Pt appt is 10/23/18 with PCP.

## 2018-10-09 NOTE — Telephone Encounter (Signed)
Called patient back and scheduled appt w/ Philis Nettle, NP for this Wed (10/11/18) @ 10:00 am.  Informed pt that he should go to the ER to be evaluated sooner if symptoms worsen.

## 2018-10-10 NOTE — Telephone Encounter (Signed)
He is seeing you today :)  See below

## 2018-10-11 ENCOUNTER — Ambulatory Visit: Payer: Medicare Other | Admitting: Family Medicine

## 2018-10-11 ENCOUNTER — Encounter: Payer: Self-pay | Admitting: Family Medicine

## 2018-10-11 VITALS — BP 128/86 | HR 88 | Temp 98.1°F | Resp 18 | Ht 66.0 in | Wt 184.0 lb

## 2018-10-11 DIAGNOSIS — R1030 Lower abdominal pain, unspecified: Secondary | ICD-10-CM | POA: Diagnosis not present

## 2018-10-11 LAB — CBC WITH DIFFERENTIAL/PLATELET
BASOS PCT: 0.6 % (ref 0.0–3.0)
Basophils Absolute: 0.1 10*3/uL (ref 0.0–0.1)
Eosinophils Absolute: 0.3 10*3/uL (ref 0.0–0.7)
Eosinophils Relative: 2.6 % (ref 0.0–5.0)
HCT: 45.2 % (ref 39.0–52.0)
Hemoglobin: 15 g/dL (ref 13.0–17.0)
Lymphocytes Relative: 27.6 % (ref 12.0–46.0)
Lymphs Abs: 3 10*3/uL (ref 0.7–4.0)
MCHC: 33.3 g/dL (ref 30.0–36.0)
MCV: 102.8 fl — ABNORMAL HIGH (ref 78.0–100.0)
Monocytes Absolute: 0.8 10*3/uL (ref 0.1–1.0)
Monocytes Relative: 7.1 % (ref 3.0–12.0)
Neutro Abs: 6.7 10*3/uL (ref 1.4–7.7)
Neutrophils Relative %: 62.1 % (ref 43.0–77.0)
Platelets: 238 10*3/uL (ref 150.0–400.0)
RBC: 4.4 Mil/uL (ref 4.22–5.81)
RDW: 13.5 % (ref 11.5–15.5)
WBC: 10.8 10*3/uL — ABNORMAL HIGH (ref 4.0–10.5)

## 2018-10-11 NOTE — Progress Notes (Signed)
Subjective:    Patient ID: Justin Patel, male    DOB: Dec 26, 1950, 68 y.o.   MRN: [id number]  HPI   Patient presents to clinic complaining of right-sided lower abdominal pain that is now spread across to the left side as well.  Patient does have a history of gastric bypass surgery as well as appendectomy.  Patient states the right lower quadrant pain has been present for many months, unable to give exact timeline.  Patient denies any issues with bowel movements or urination.  Denies diarrhea, nausea or vomiting.  Patient states his appetite is normal.   Recent lab work reviewed: CBC Latest Ref Rng & Units 08/22/2018 06/05/2018 05/16/2018  WBC 4.0 - 10.5 K/uL 7.9 9.9 9.1  Hemoglobin 13.0 - 17.0 g/dL 14.4 15.0 13.0  Hematocrit 39.0 - 52.0 % 43.3 45.8 40.4  Platelets 150.0 - 400.0 K/uL 208.0 266.0 326.0   CMP Latest Ref Rng & Units 08/22/2018 07/17/2018 07/16/2018  Glucose 70 - 99 mg/dL 119(H) 139(H) -  BUN 6 - 23 mg/dL 23 23 -  Creatinine 0.40 - 1.50 mg/dL 1.18 1.17 1.20  Sodium 135 - 145 mEq/L 139 141 -  Potassium 3.5 - 5.1 mEq/L 4.1 4.6 -  Chloride 96 - 112 mEq/L 106 104 -  CO2 19 - 32 mEq/L 24 28 -  Calcium 8.4 - 10.5 mg/dL 9.6 9.7 -  Total Protein 6.0 - 8.3 g/dL 7.0 - -  Total Bilirubin 0.2 - 1.2 mg/dL 0.3 - -  Alkaline Phos 39 - 117 U/L 45 - -  AST 0 - 37 U/L 21 - -  ALT 0 - 53 U/L 32 - -     Patient Active Problem List   Diagnosis Date Noted  . Muscle weakness 08/24/2018  . Dyspnea on exertion 08/24/2018  . Depression, major, single episode, mild ([place]) 08/24/2018  . Macrocytosis without anemia 07/05/2018  . RLQ abdominal pain 05/18/2018  . CKD (chronic kidney disease) stage 3, GFR 30-59 ml/min ([facility]) 05/08/2018  . Lumbar scoliosis 04/25/2018  . Sinusitis 01/25/2018  . Easy bruising 12/09/2017  . 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 Macrocytosis 11/02/2017  . Breast pain in male 10/18/2017  . Abnormal weight loss 10/18/2017  . GERD (gastroesophageal  reflux disease) 10/18/2017  . Constipation 10/18/2017  . Osteoporosis without current pathological fracture 07/20/2017  . Vitamin D deficiency 07/20/2017  . Rash 03/11/2017  . Seborrheic dermatitis 02/24/2017  . Intertrigo 02/24/2017  . Tachycardia 09/27/2016  . Anxiety 06/25/2016  . Cervical facet syndrome 03/15/2016  . Facet syndrome, lumbar 03/15/2016  . DDD (degenerative disc disease), cervical 03/15/2016  . DDD (degenerative disc disease), lumbar 03/15/2016  . Other iron deficiency anemias 11/27/2015  . Recurrent falls 07/25/2015  . Chronic pain syndrome 04/16/2015  . Alcohol abuse 10/01/2014  . 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 Benign prostatic hyperplasia with urinary obstruction 11/16/2013  . Enlarged prostate 11/08/2013  . Anemia 11/08/2013  . H/O urinary stone 10/05/2013  . Renal cyst 08/27/2013  . Diabetes ([employer]) 03/21/2013  . Essential hypertension, benign 03/21/2013  . Hyperlipidemia 03/21/2013  . Obesity 03/21/2013   Social History   Tobacco Use  . Smoking status: Never Smoker  . Smokeless tobacco: Never Used  Substance Use Topics  . Alcohol use: No   Review of Systems   Constitutional: Negative for chills, fatigue and fever.  HENT: Negative for congestion, ear pain, sinus pain and sore throat.   Eyes: Negative.   Respiratory: Negative for cough, shortness  of breath and wheezing.   Cardiovascular: Negative for chest pain, palpitations and leg swelling.  Gastrointestinal: +lower ABD pain. Negative for  diarrhea, nausea and vomiting.  Genitourinary: Negative for dysuria, frequency and urgency.  Musculoskeletal: Negative for arthralgias and myalgias.  Skin: Negative for color change, pallor and rash.  Neurological: Negative for syncope, light-headedness and headaches.  Psychiatric/Behavioral: The patient is not nervous/anxious.       Objective:   Physical Exam Vitals signs and nursing note reviewed.  Constitutional:      General: He is not in  acute distress.    Appearance: He is not ill-appearing, toxic-appearing or diaphoretic.  HENT:     Head: Normocephalic and atraumatic.     Mouth/Throat:     Mouth: Mucous membranes are moist.  Eyes:     General: No scleral icterus.    Extraocular Movements: Extraocular movements intact.  Cardiovascular:     Rate and Rhythm: Normal rate and regular rhythm.  Pulmonary:     Effort: Pulmonary effort is normal. No respiratory distress.     Breath sounds: Normal breath sounds.  Abdominal:     General: Bowel sounds are normal. There is no distension.     Palpations: Abdomen is soft.     Tenderness: There is abdominal tenderness in the right lower quadrant, suprapubic area and left lower quadrant.     Hernia: No hernia is present.  Skin:    General: Skin is warm and dry.     Coloration: Skin is not jaundiced or pale.  Neurological:     Mental Status: He is alert and oriented to person, place, and time.  Psychiatric:        Mood and Affect: Mood normal.        Behavior: Behavior normal.    Vitals:   10/11/18 1019  BP: 128/86  Pulse: 88  Resp: 18  Temp: 98.1 F (36.7 C)  SpO2: 95%      Assessment & Plan:    A total of 25  minutes were spent face-to-face with the patient during this encounter and over half of that time was spent on counseling and coordination of care. The patient was counseled on possible causes of pain, plan for work-up of pain, discussion of recent blood work done in January.  Lower abdominal pain-unclear reason for lower abdominal pain.  Patient states he is unable to provide a urine sample for [place] to check his urine in clinic at this time.  Patient is agreeable to CBC to be drawn today, I also wants to recheck a metabolic panel however patient states he had 1 done recently at his endocrinology appointment so would prefer to just get CBC only.  Due to tenderness on right lower quadrant, suprapubic area and left lower quadrant patient is agreeable to do CT scan for  further imaging.   Patient is aware we will contact in regards to CT scan appointment.  Patient advised that if his pain worsens, he develops other symptoms including fever, vomiting, diarrhea to call office right away and make [place] aware.  Patient will give early scheduled follow-up with PCP as planned.  He is aware he return to clinic sooner if any issues arise.

## 2018-10-13 ENCOUNTER — Other Ambulatory Visit: Payer: Self-pay | Admitting: Family Medicine

## 2018-10-13 DIAGNOSIS — E119 Type 2 diabetes mellitus without complications: Secondary | ICD-10-CM

## 2018-10-23 ENCOUNTER — Ambulatory Visit
Admission: RE | Admit: 2018-10-23 | Discharge: 2018-10-23 | Disposition: A | Discharge status: Home / Self Care | Payer: Medicare Other | Source: Ambulatory Visit | Attending: Family Medicine | Admitting: Family Medicine

## 2018-10-23 ENCOUNTER — Ambulatory Visit: Payer: Medicare Other | Admitting: Family Medicine

## 2018-10-23 ENCOUNTER — Other Ambulatory Visit: Payer: Self-pay

## 2018-10-23 DIAGNOSIS — R1030 Lower abdominal pain, unspecified: Secondary | ICD-10-CM | POA: Insufficient documentation

## 2018-10-23 MED ORDER — IOHEXOL 300 MG/ML  SOLN
100.0000 mL | Freq: Once | INTRAMUSCULAR | Status: AC | PRN
  Administered 2018-10-23: 100 mL via INTRAVENOUS

## 2018-10-24 ENCOUNTER — Other Ambulatory Visit: Payer: Self-pay | Admitting: Family Medicine

## 2018-10-25 ENCOUNTER — Encounter: Payer: Self-pay | Admitting: Family Medicine

## 2018-10-31 ENCOUNTER — Telehealth: Payer: Self-pay

## 2018-10-31 NOTE — Telephone Encounter (Signed)
Copied from [place] [id number]. Topic: Quick Communication - Appointment Cancellation >> Oct 31, 2018  2:59 PM Margot Ables wrote: Patient called to cancel appointment scheduled for 11/07/2018. Patient has not rescheduled their appointment.  Pt open to telehealth visit if needed. He doesn't know that there is a reason right now. He is aware of current issues. Pt cancelled due to [place]. Patient is requesting Dr. Caryl Bis to review last labs and CT results for his opinion. Ok to call with advice.   Route to department's PEC pool.

## 2018-10-31 NOTE — Telephone Encounter (Signed)
Sent to PCP as an FYI  

## 2018-10-31 NOTE — Telephone Encounter (Signed)
If he is not having any new or worsening issues I do think it would be okay to push his follow-up out though we could do a WebEx visit to follow-up on his prior issues.  If he is willing to do that we can get him scheduled.

## 2018-11-01 NOTE — Telephone Encounter (Signed)
I reviewed the CT scan. There did not appear to be anything acute noted. There was thickening in his esophagus though that has been seen previously and evaluated by an endoscopy.

## 2018-11-01 NOTE — Telephone Encounter (Signed)
Called and spoke with pt. Pt advised and voiced understanding.  

## 2018-11-01 NOTE — Telephone Encounter (Signed)
Called and spoke with patient. Pt did not want to do a Webex visit. Pt will call back to reschedule his appt. Pt would like for you to review his  CT on March 16,2020.   Sent to PCP

## 2018-11-06 ENCOUNTER — Other Ambulatory Visit: Payer: Medicare Other

## 2018-11-07 ENCOUNTER — Other Ambulatory Visit: Payer: Self-pay | Admitting: Family Medicine

## 2018-11-07 ENCOUNTER — Ambulatory Visit: Payer: Medicare Other | Admitting: Family Medicine

## 2018-11-07 DIAGNOSIS — E119 Type 2 diabetes mellitus without complications: Secondary | ICD-10-CM

## 2018-11-07 NOTE — Telephone Encounter (Signed)
Last OV 10/11/2018   Vit D last refilled 09/18/2018 48.07   Should he continue vit D?  No next OV scheduled    Sent to PCP to advise

## 2018-11-07 NOTE — Telephone Encounter (Signed)
Carafate and Crestor sent to pharmacy.  He should not need a refill of the vitamin D as he should be taking this over-the-counter.  I reviewed his rheumatology note and they advised that he take vitamin D 1000 international units over-the-counter.

## 2018-11-10 ENCOUNTER — Other Ambulatory Visit: Payer: Medicare Other

## 2018-11-21 ENCOUNTER — Other Ambulatory Visit: Payer: Medicare Other

## 2018-11-24 ENCOUNTER — Other Ambulatory Visit: Payer: Self-pay

## 2018-11-24 ENCOUNTER — Encounter: Payer: Self-pay | Admitting: Family Medicine

## 2018-11-24 ENCOUNTER — Ambulatory Visit (INDEPENDENT_AMBULATORY_CARE_PROVIDER_SITE_OTHER): Payer: Medicare Other | Admitting: Family Medicine

## 2018-11-24 ENCOUNTER — Telehealth: Payer: Self-pay

## 2018-11-24 DIAGNOSIS — H9202 Otalgia, left ear: Secondary | ICD-10-CM

## 2018-11-24 DIAGNOSIS — Z9622 Myringotomy tube(s) status: Secondary | ICD-10-CM

## 2018-11-24 DIAGNOSIS — J019 Acute sinusitis, unspecified: Secondary | ICD-10-CM | POA: Diagnosis not present

## 2018-11-24 DIAGNOSIS — Z9889 Other specified postprocedural states: Secondary | ICD-10-CM

## 2018-11-24 MED ORDER — AMOXICILLIN-POT CLAVULANATE 875-125 MG PO TABS: 1.0000 | ORAL_TABLET | Freq: Two times a day (BID) | ORAL | 0 refills | Status: DC

## 2018-11-24 NOTE — Telephone Encounter (Signed)
FYI Pt has been scheduled for a Virtual visit today at 10:40 Am   Pt stated that he has had many sinus surgery but none recently and has had a tube in his left ear for a long time.   Left ear more irritated has a tube in it bought OTC pain drops an dthat seem to make thing worse per pt. Pt would like to discuss ear drops that will give him relief.   Sent to [facility] and [facility]

## 2018-11-24 NOTE — Telephone Encounter (Signed)
You have a 3:15 today would you like pt to come in for an OV?

## 2018-11-24 NOTE — Telephone Encounter (Signed)
Copied from [place] [phone]. Topic: General - Other >> Nov 23, 2018  4:41 PM Ivar Drape wrote: Reason for CRM:  Patient is experiencing congestion and ear pain.  Dark green to black mucus.  He wants to know if a antibiotic could be called in

## 2018-11-24 NOTE — Telephone Encounter (Signed)
Called and spoke to patient.  Pt c/o of head congestion, ear pain in left ear, sinus pressure, runny nose, and dark green and black mucus when blowing his nose.  Pt gave a history of having surgery on his sinuses and a tube placed in his left ear.  Pt says that recent symptoms started about a month ago but has ongoing chronic issues with ears and sinuses.  Pt also believes symptoms are related to seasonal allergies.  Pt said that he put ear drops in his left ear and feels that the ear drops clogged the tube in his ear.   No fever, no cough, no shortness of breath, no chest congestion, no vomiting, no diarrhea.  Denies travel.  Denies being around anyone sick.    Pt would like for antibiotics to be called in but is open to a phone or in person office visit.  Pt does not have his smart phone set up so he is unable to do a DOXYMe appt.  Notes will be forwarded to PCP to be advised about type of appointment to be scheduled for today.

## 2018-11-24 NOTE — Telephone Encounter (Signed)
Pt has been scheduled for a Virtual visit today at 10:40 Am   Pt stated that he has had many sinus surgery but none recently and has had a tube in his left ear for a long time.   Left ear more irritated has a tube in it bought OTC pain drops an dthat seem to make thing worse per pt. Pt would like to discuss ear drops that will give him relief.   Sent to [facility] and [facility]

## 2018-11-24 NOTE — Progress Notes (Signed)
Patient ID: Justin Patel, male   DOB: 1951/04/04, 68 y.o.   MRN: [id number]  Virtual Visit via video note  This visit type was conducted due to national recommendations for restrictions regarding the COVID-19 pandemic (e.g. social distancing).  This format is felt to be most appropriate for this patient at this time.  All issues noted in this document were discussed and addressed.  No physical exam was performed (except for noted visual exam findings with Video Visits).   I connected with Justin Patel on 11/24/18 at 10:40 AM EDT by a video enabled telemedicine application or telephone and verified that I am speaking with the correct person using two identifiers. Location patient: home Location provider: work or home office Persons participating in the virtual visit: patient, provider  I discussed the limitations, risks, security and privacy concerns of performing an evaluation and management service by telephone and the availability of in person appointments. I also discussed with the patient that there may be a patient responsible charge related to this service. The patient expressed understanding and agreed to proceed.  Interactive audio and video telecommunications were attempted between this provider and patient, however failed, due to patient having technical difficulties. We continued and completed visit with audio only.  HPI:  Patient and I connected via video, we at first were able to see each other he could hear me but cannot hear him.  We ended the video call and went to telephone to proceed forward with visit.  Patient complains of sinus pain and pressure, thick green sinus drainage, postnasal drip have been present for approximately 1 week.  Patient reports a long history of sinus issues including sinus surgeries.  Patient also has ear tube in left ear, and notes that his left ear is sore.  Denies fever or chills.  Denies cough, shortness of breath or wheezing.  Denies GI or GU  issues.  Denies body aches.   ROS: See pertinent positives and negatives per HPI.  Past Medical History:  Diagnosis Date  . 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 Anxiety   . Arthritis   . 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  removal scheduled to clear margins.  . CHF (congestive heart failure) ([place])    no longer after weight loss  . Chronic kidney disease    cysts  . Degenerative disc disease   . Diabetes mellitus    no longer diabetic-or on meds  . Family history of adverse reaction to anesthesia    sons wake up combative  . Gastric ulcer   . GERD (gastroesophageal reflux disease)   . H/O hiatal hernia   . Headache(784.0)    sinus  . Heart murmur   . Hx of congestive heart failure   . Hyperlipidemia   . Hypertension   . Iron deficiency anemia 02/28/2015  . Opiate abuse, continuous ([place]) 06/20/2015  . Sacral fracture, closed ([place])   . Seizures ([place])    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, after GI bleed  . Sleep apnea    hx not now since wt loss  . Stones in the urinary tract   . Syncope and collapse     Past Surgical History:  Procedure Laterality Date  . ANTERIOR CERVICAL DECOMP/DISCECTOMY FUSION  01/26/2012   Procedure: ANTERIOR CERVICAL DECOMPRESSION/DISCECTOMY FUSION 3 LEVELS;  Surgeon: Floyce Stakes, MD;  Location: [facility];  Service: Neurosurgery;  Laterality: N/A;  Cervical three-four  Cervical four-five Cervical five-six Cervical six-seven , Anterior cervical decompression/diskectomy, fusion, plate  . ANTERIOR LAT LUMBAR FUSION Right 04/25/2018   Procedure: Right Lumbar two-three Lumbar three-four Lumbar four-five anterior  lateral interbody fusion  with posterior percutaneous pedicle screws;  Surgeon: Erline Levine, MD;  Location: [facility];  Service: Neurosurgery;  Laterality: Right;  . APPENDECTOMY    . [place], normal  . CARDIAC CATHETERIZATION     [place]  .  COLONOSCOPY WITH PROPOFOL N/A 12/27/2017   Procedure: COLONOSCOPY WITH PROPOFOL;  Surgeon: Toledo, Benay Pike, MD;  Location: [facility];  Service: Gastroenterology;  Laterality: N/A;  . ESOPHAGOGASTRODUODENOSCOPY (EGD) WITH PROPOFOL N/A 12/27/2017   Procedure: ESOPHAGOGASTRODUODENOSCOPY (EGD) WITH PROPOFOL;  Surgeon: Toledo, Benay Pike, MD;  Location: [facility];  Service: Gastroenterology;  Laterality: N/A;  . GASTRIC BYPASS  2010   Ken Caryl  . HERNIA REPAIR  2000   hiatal  . JOINT REPLACEMENT    . KNEE ARTHROSCOPY     bilateral, left x 2  . LUMBAR PERCUTANEOUS PEDICLE SCREW 3 LEVEL N/A 04/25/2018   Procedure: LUMBAR PERCUTANEOUS PEDICLE SCREW 3 LEVEL;  Surgeon: Erline Levine, MD;  Location: [facility];  Service: Neurosurgery;  Laterality: N/A;  . NASAL SINUS SURGERY     x5  . OSTEOTOMY  2001   left  . TONSILLECTOMY    . TOTAL KNEE ARTHROPLASTY Left 05/2014   Dr. Marry Guan  . UVULOPALATOPLASTY  2011    Family History  Problem Relation Age of Onset  . Dementia Mother 94  . Heart disease Father 89  . Diabetes Father   . Lymphoma Sister        lymphoma, stage 4  . Ovarian cancer Sister 59       Ovarian  . Lupus Sister   . Prostate cancer Maternal Uncle   . Skin cancer Maternal Uncle   . Tongue cancer Maternal Uncle        uncle died of heart attack  . Alcoholism Maternal Uncle   . Lung cancer Paternal Aunt        pat aunts x 4 died of lung cancer  . Lung cancer Paternal Aunt   . Lung cancer Paternal Aunt   . Lung cancer Paternal Aunt   . Mesothelioma Cousin        paternal cousin     Current Outpatient Medications:  .  acetaminophen (TYLENOL) 500 MG tablet, Take 1,000 mg by mouth 3 (three) times daily as needed for moderate pain or headache. , Disp: , Rfl:  .  amoxicillin-clavulanate (AUGMENTIN) 875-125 MG tablet, Take 1 tablet by mouth 2 (two) times daily., Disp: 14 tablet, Rfl: 0 .  blood glucose meter kit and supplies KIT, Dispense based on patient and insurance  preference. Check once daily. ICD doing E11.9., Disp: 1 each, Rfl: 0 .  diphenhydrAMINE (BENADRYL) 25 MG tablet, Take 25 mg by mouth daily as needed for allergies., Disp: , Rfl:  .  DULoxetine (CYMBALTA) 60 MG capsule, TAKE ONE CAPSULE BY MOUTH DAILY, Disp: 30 capsule, Rfl: 0 .  esomeprazole (NEXIUM) 40 MG capsule, TAKE 1 CAPSULE BY MOUTH ONCE DAILY, Disp: 90 capsule, Rfl: 1 .  furosemide (LASIX) 20 MG tablet, TAKE 1 TABLET BY MOUTH ONCE DAILY, Disp: 90 tablet, Rfl: 0 .  linagliptin (TRADJENTA) 5 MG TABS tablet, Take 1 tablet (5 mg total) by mouth daily., Disp: 30 tablet, Rfl: 3 .  losartan (COZAAR) 100 MG tablet, TAKE 1 TABLET BY  MOUTH DAILY, Disp: 90 tablet, Rfl: 0 .  methocarbamol (ROBAXIN) 750 MG tablet, Take 1 tablet (750 mg total) by mouth every 6 (six) hours as needed for muscle spasms., Disp: 60 tablet, Rfl: 1 .  metoprolol succinate (TOPROL-XL) 50 MG 24 hr tablet, TAKE 1 TABLET BY MOUTH WITH OR IMMEDIATLY FOLLOWING A MEAL, Disp: 30 tablet, Rfl: 0 .  Multiple Vitamin (MULTIVITAMIN) capsule, Take 1 capsule by mouth daily. With copper and zinc, Disp: , Rfl:  .  nystatin cream (MYCOSTATIN), Apply 1 application topically 2 (two) times daily. (Patient taking differently: Apply 1 application topically 2 (two) times daily as needed (dermatitis). ), Disp: 30 g, Rfl: 0 .  nystatin-triamcinolone ointment (MYCOLOG), Apply 1 application topically 2 (two) times daily., Disp: 30 g, Rfl: 0 .  rosuvastatin (CRESTOR) 20 MG tablet, TAKE ONE TABLET EVERY DAY, Disp: 90 tablet, Rfl: 1 .  sucralfate (CARAFATE) 1 g tablet, TAKE ONE TABLET FOUR TIMES DAILY, Disp: 120 tablet, Rfl: 0 .  traMADol (ULTRAM) 50 MG tablet, Take by mouth every 6 (six) hours as needed. Take every 6 hours ( 4 times daily) as needed for pain, Disp: , Rfl:  .  Vitamin D, Ergocalciferol, (DRISDOL) 1.25 MG (50000 UT) CAPS capsule, TAKE 1 CAPSULE BY MOUTH EVERY 7 DAYS, Disp: 8 capsule, Rfl: 0  EXAM:  GENERAL: alert, oriented, appears well and  in no acute distress  HEENT: atraumatic, conjunttiva clear, no obvious abnormalities on inspection of external nose and ears  NECK: normal movements of the head and neck  LUNGS: on inspection no signs of respiratory distress, breathing rate appears normal, no obvious gross SOB, gasping or wheezing  CV: no obvious cyanosis  MS: moves all visible extremities without noticeable abnormality  PSYCH/NEURO: pleasant and cooperative, no obvious depression or anxiety, speech and thought processing grossly intact  ASSESSMENT AND PLAN:  Discussed the following assessment and plan:  Acute sinusitis, recurrence not specified, unspecified location - Plan: amoxicillin-clavulanate (AUGMENTIN) 875-125 MG tablet  Left ear pain - Plan: Ambulatory referral to ENT  History of placement of ear tubes - Plan: Ambulatory referral to ENT  History of sinus surgery - Plan: Ambulatory referral to ENT  I suspect patient has a sinus infection due to the description of his symptoms, and possibly an ear infection as well due to left ear pain.  Patient will take Augmentin twice daily for 10 days to treat sinus infection.  Advised to do saline nasal rinses, increase fluid intake and do good handwashing to help reduce sinus congestion and inflammation.  Patient interested in seeing an ENT related to his history of sinus surgery/sinus issues, ear tubes and would like referral.  Patient aware that he may not be able to go into the ENT office for the next couple of months due to the novel coronavirus pandemic, and he states that he understands this and this is fine.   I discussed the assessment and treatment plan with the patient. The patient was provided an opportunity to ask questions and all were answered. The patient agreed with the plan and demonstrated an understanding of the instructions.   The patient was advised to call back or seek an in-person evaluation if the symptoms worsen or if the condition fails to improve  as anticipated.   Jodelle Green, FNP

## 2018-11-24 NOTE — Telephone Encounter (Signed)
Please confirm when his sinus surgery was. If it was recently he needs to contact his ENT physician for this. If it was a long time ago he needs to do a virtual visit.

## 2018-12-11 ENCOUNTER — Other Ambulatory Visit: Payer: Self-pay | Admitting: Family Medicine

## 2019-01-10 ENCOUNTER — Other Ambulatory Visit: Payer: Self-pay | Admitting: Family Medicine

## 2019-01-12 ENCOUNTER — Other Ambulatory Visit: Payer: Self-pay | Admitting: Family Medicine

## 2019-01-17 ENCOUNTER — Other Ambulatory Visit: Payer: Self-pay | Admitting: Family Medicine

## 2019-01-26 ENCOUNTER — Telehealth: Payer: Self-pay | Admitting: Family Medicine

## 2019-01-29 ENCOUNTER — Telehealth: Payer: Self-pay

## 2019-01-29 NOTE — Telephone Encounter (Signed)

## 2019-01-30 ENCOUNTER — Ambulatory Visit (INDEPENDENT_AMBULATORY_CARE_PROVIDER_SITE_OTHER): Payer: Medicare Other

## 2019-01-30 ENCOUNTER — Other Ambulatory Visit: Payer: Self-pay

## 2019-01-30 DIAGNOSIS — R0602 Shortness of breath: Secondary | ICD-10-CM | POA: Diagnosis not present

## 2019-01-30 DIAGNOSIS — I951 Orthostatic hypotension: Secondary | ICD-10-CM | POA: Diagnosis not present

## 2019-02-01 NOTE — Telephone Encounter (Signed)
The patient's most recent vitamin D was in the normal range.  This does not need to be refilled in the future and the patient should start taking 1000 international units of vitamin D daily over-the-counter.  We will need to recheck his vitamin D levels at his next visit.  Thanks.

## 2019-02-02 NOTE — Telephone Encounter (Signed)
Called and spoke with the patient and informed him of his vitamin D results.  Jaquann Guarisco,cma

## 2019-02-09 ENCOUNTER — Other Ambulatory Visit: Payer: Self-pay | Admitting: Family Medicine

## 2019-02-09 DIAGNOSIS — E119 Type 2 diabetes mellitus without complications: Secondary | ICD-10-CM

## 2019-02-14 ENCOUNTER — Telehealth: Payer: Self-pay | Admitting: Physician Assistant

## 2019-02-14 NOTE — Progress Notes (Signed)
Cardiology Office Note Date:  02/15/2019  Patient ID:  Khylin, Gutridge 1950/12/14, MRN [id number] PCP:  [name], MD  Cardiologist:  Dr. Fletcher Anon, MD    Chief Complaint: Follow-up  History of Present Illness: EMRY BARBATO is a 68 y.o. male with history of chronic diastolic CHF, presyncope versus seizure disorder, morbid obesity status post gastric bypass, iron deficiency anemia in the setting of prior gastric bypass, diabetes, hypertension, hyperlipidemia, anxiety, prior alcohol abuse, degenerative disc disease status post lumbar surgery in 04/2018 with intervention at L3-L2, L3-L4, and L5, cervical arthritis, and GERD who presents for follow up shortness of breath.  Remote echo from 2015 showed an EF of 55 to 60%, severe concentric LVH, no regional wall motion abnormalities, grade 1 diastolic dysfunction, trivial mitral regurgitation, moderately dilated left atrium, mildly dilated right atrium.  Carotid artery ultrasound from 08/2014 showed 1 to 39% bilateral ICA stenosis.  Patient was evaluated by Dr. Fletcher Anon in 04/2016 for follow-up of syncope versus seizures.  He did report some orthostatic dizziness that improved after decreasing Toprol and discontinuing hydralazine.  Around the same time he was also placed on Keppra.    With this, he denied any recurrent episodes of LOC.    He was evaluated by PCP on 08/21/2018 with multiple complaints including decreased muscle mass, weight loss, depression, and elevated BP readings.  He reported significant pain with palpation of his legs and arms as well as weakness in his legs.  He noted red spots on the legs that had since improved.  He reported evaluation by neurology with normal nerve conduction studies.  Labs were unrevealing, including normal CPK, aldolase, and TSH.  Lumbar spine MRI was unremarkable.  Cervical spine MRI showed mild changes.  He was evaluated by oncology with an unrevealing bone marrow biopsy.  Biopsy of prior petechial  rash showed lymphocytic inflammation.  He has since been evaluated by rheumatology without specific etiology being determined and referred to physical therapy.  He reported feeling physically wiped out with any kind of exertion and noted dyspnea on exertion over the past prior several months without chest pain or edema.  EKG checked at that time showed sinus rhythm with no acute ST-T changes.  Labs were checked which showed a BNP of 22, hemoglobin 14.4, potassium 4.1, serum creatinine 1.18, LFT normal.  Chest x-ray on 08/21/2018 showed mild left base pleural/parenchymal thickening consistent with scarring with no acute cardiopulmonary findings.  This was a stable exam compared to prior.  He was seen by cardiology on 10/02/2018 for evaluation of shortness of breath that dated back to 04/2018.  He reported feeling significantly better when exerting himself and noted the shortness of breath will occur when he is sitting watching TV or laying in bed watching TV, lasting 1 to 2 minutes with spontaneous resolution.  He denied any associated chest pain, palpitations, dizziness, presyncope, or syncope.  He felt like the shortness of breath was related to underlying depression secondary to his above complaints without definitive etiology.  He has subsequently undergone CT of the abdomen pelvis secondary to right lower quadrant pain with no acute findings to explain the patient's symptoms with incidental aortic atherosclerosis.  He underwent echo on 01/30/2019 (which was delayed secondary to COVID-19 pending) which showed an EF of 55 to 60%, normal LV cavity size, diastolic dysfunction, normal RV systolic function, normal RV cavity size, grossly normal tricuspid valve, tricuspid aortic valve without evidence of aortic valve stenosis, aortic root and ascending aorta  were normal in size and structure.  He comes in doing well from a cardiac perspective.  He denies any chest pain, exertional shortness of breath, dizziness,  presyncope, or syncope.  No lower extremity swelling, abdominal distention, orthopnea, PND, early satiety.  No falls, BRBPR, or melena.  He continues to note stable nonexertional shortness of breath that is only noted when he is sitting watching TV and is described as an isolated deep inhalation.  He ambulates 2 miles per day every day without any symptoms of chest pain or shortness of breath.  He continues to note multiple noncardiac complaints including subjective petechia/rash on the lower extremities, lower abdominal pain when Valsalva in for bowel movement or to his palpation, neck pain, and dental pain with multiple dental caries.  Blood pressure remains well controlled.  He is tolerating all cardiac medications without issues.   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  removal scheduled to clear margins.   CHF (congestive heart failure) (HCC)    no longer after weight loss   Chronic kidney disease    cysts   Degenerative disc disease    Diabetes mellitus    no longer diabetic-or on meds   Family history of adverse reaction to anesthesia    sons wake up combative   Gastric ulcer    GERD (gastroesophageal reflux disease)    H/O hiatal hernia    Headache(784.0)    sinus   Heart murmur    Hx of congestive heart failure    Hyperlipidemia    Hypertension    Iron deficiency anemia 02/28/2015   Opiate abuse, continuous ([place]) 06/20/2015   Sacral fracture, closed ([place])    Seizures ([place])    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, after GI bleed   Sleep apnea    hx not now since wt loss   Stones in the urinary tract    Syncope and collapse     Past Surgical History:  Procedure Laterality Date   ANTERIOR CERVICAL DECOMP/DISCECTOMY FUSION  01/26/2012   Procedure: ANTERIOR CERVICAL DECOMPRESSION/DISCECTOMY FUSION 3  LEVELS;  Surgeon: Floyce Stakes, MD;  Location: [facility];  Service: Neurosurgery;  Laterality: N/A;  Cervical three-four Cervical four-five Cervical five-six Cervical six-seven , Anterior cervical decompression/diskectomy, fusion, plate   ANTERIOR LAT LUMBAR FUSION Right 04/25/2018   Procedure: Right Lumbar two-three Lumbar three-four Lumbar four-five anterior  lateral interbody fusion  with posterior percutaneous pedicle screws;  Surgeon: Erline Levine, MD;  Location: [place];  Service: Neurosurgery;  Laterality: Right;   [place], normal   CARDIAC CATHETERIZATION     [place]   COLONOSCOPY WITH PROPOFOL N/A 12/27/2017   Procedure: COLONOSCOPY WITH PROPOFOL;  Surgeon: Toledo, Benay Pike, MD;  Location: [facility];  Service: Gastroenterology;  Laterality: N/A;   ESOPHAGOGASTRODUODENOSCOPY (EGD) WITH PROPOFOL N/A 12/27/2017   Procedure: ESOPHAGOGASTRODUODENOSCOPY (EGD) WITH PROPOFOL;  Surgeon: Toledo, Benay Pike, MD;  Location: [facility];  Service: Gastroenterology;  Laterality: N/A;   GASTRIC BYPASS  2010   [place]   hiatal   JOINT REPLACEMENT     KNEE ARTHROSCOPY     bilateral, left x 2   LUMBAR PERCUTANEOUS PEDICLE SCREW 3 LEVEL N/A 04/25/2018  Procedure: LUMBAR PERCUTANEOUS PEDICLE SCREW 3 LEVEL;  Surgeon: Erline Levine, MD;  Location: [place];  Service: Neurosurgery;  Laterality: N/A;   NASAL SINUS SURGERY     x5   OSTEOTOMY  2001   left   TONSILLECTOMY     TOTAL KNEE ARTHROPLASTY Left 05/2014   Dr. Marry Guan   UVULOPALATOPLASTY  2011    Current Meds  Medication Sig   acetaminophen (TYLENOL) 500 MG tablet Take 1,000 mg by mouth 3 (three) times daily as needed for moderate pain or headache.    blood glucose meter kit and supplies KIT Dispense based on patient and insurance preference. Check once daily. ICD doing E11.9.   diphenhydrAMINE (BENADRYL) 25 MG tablet Take 25  mg by mouth daily as needed for allergies.   DULoxetine (CYMBALTA) 60 MG capsule TAKE 1 CAPSULE EVERY DAY   esomeprazole (NEXIUM) 40 MG capsule TAKE 1 CAPSULE BY MOUTH ONCE DAILY   furosemide (LASIX) 20 MG tablet TAKE 1 TABLET BY MOUTH ONCE DAILY   losartan (COZAAR) 100 MG tablet TAKE 1 TABLET BY MOUTH DAILY   methocarbamol (ROBAXIN) 750 MG tablet Take 1 tablet (750 mg total) by mouth every 6 (six) hours as needed for muscle spasms.   metoprolol succinate (TOPROL-XL) 50 MG 24 hr tablet TAKE 1 TABLET BY MOUTH WITH OR IMMEDIATLY FOLLOWING A MEAL   Multiple Vitamin (MULTIVITAMIN) capsule Take 1 capsule by mouth daily. With copper and zinc   nystatin cream (MYCOSTATIN) Apply 1 application topically 2 (two) times daily. (Patient taking differently: Apply 1 application topically 2 (two) times daily as needed (dermatitis). )   nystatin-triamcinolone ointment (MYCOLOG) Apply 1 application topically 2 (two) times daily.   rosuvastatin (CRESTOR) 20 MG tablet TAKE ONE TABLET BY MOUTH EVERY DAY   sucralfate (CARAFATE) 1 g tablet TAKE ONE TABLET BY MOUTH FOUR TIMES DAILY   TRADJENTA 5 MG TABS tablet TAKE ONE TABLET EVERY DAY   traMADol (ULTRAM) 50 MG tablet Take by mouth every 6 (six) hours as needed. Take every 6 hours ( 4 times daily) as needed for pain   Vitamin D, Ergocalciferol, (DRISDOL) 1.25 MG (50000 UT) CAPS capsule TAKE 1 CAPSULE EVERY 7 DAYS    Allergies:   Altace [ramipril], Levaquin [levofloxacin in d5w], Metformin, Levofloxacin, Trulicity [dulaglutide], Conray [iothalamate], and Lyrica [pregabalin]   Social History:  The patient  reports that he has never smoked. He has never used smokeless tobacco. He reports that he does not drink alcohol or use drugs.   Family History:  The patient's family history includes Alcoholism in his maternal uncle; Dementia (age of onset: 61) in his mother; Diabetes in his father; Heart disease (age of onset: 75) in his father; Lung cancer in his  paternal aunt, paternal aunt, paternal aunt, and paternal aunt; Lupus in his sister; Lymphoma in his sister; Mesothelioma in his cousin; Ovarian cancer (age of onset: 69) in his sister; Prostate cancer in his maternal uncle; Skin cancer in his maternal uncle; Tongue cancer in his maternal uncle.  ROS:   Review of Systems  Constitutional: Negative for chills, diaphoresis, fever, malaise/fatigue and weight loss.  HENT: Negative for congestion.   Eyes: Negative for discharge and redness.  Respiratory: Positive for shortness of breath. Negative for cough, hemoptysis, sputum production and wheezing.   Cardiovascular: Negative for chest pain, palpitations, orthopnea, claudication, leg swelling and PND.  Gastrointestinal: Positive for abdominal pain. Negative for blood in stool, constipation, diarrhea, heartburn, melena, nausea and vomiting.  Genitourinary: Negative for hematuria.  Musculoskeletal: Positive for neck pain. Negative for falls and myalgias.  Skin: Positive for rash.  Neurological: Negative for dizziness, tingling, tremors, sensory change, speech change, focal weakness, loss of consciousness and weakness.  Endo/Heme/Allergies: Does not bruise/bleed easily.  Psychiatric/Behavioral: Negative for substance abuse. The patient is not nervous/anxious.   All other systems reviewed and are negative.    PHYSICAL EXAM:  VS:  BP 120/80 (BP Location: Left Arm, Patient Position: Sitting, Cuff Size: Normal)    Pulse 66    Temp (!) 97.2 F (36.2 C)    Ht _0  (1.676 m)    Wt 188 lb 12 oz (85.6 kg)    SpO2 97%    BMI 30.47 kg/m  BMI: Body mass index is 30.47 kg/m.  Physical Exam  Constitutional: He is oriented to person, place, and time. He appears well-developed and well-nourished.  HENT:  Head: Normocephalic and atraumatic.  Eyes: Right eye exhibits no discharge. Left eye exhibits no discharge.  Neck: Normal range of motion. No JVD present.  Cardiovascular: Normal rate, regular rhythm, S1  normal, S2 normal and normal heart sounds. Exam reveals no distant heart sounds, no friction rub, no midsystolic click and no opening snap.  No murmur heard. Pulses:      Posterior tibial pulses are 2+ on the right side and 2+ on the left side.  Pulmonary/Chest: Effort normal and breath sounds normal. No respiratory distress. He has no decreased breath sounds. He has no wheezes. He has no rales. He exhibits no tenderness.  Abdominal: Soft. He exhibits no distension. There is no abdominal tenderness.  Musculoskeletal:        General: No edema.  Neurological: He is alert and oriented to person, place, and time.  Skin: Skin is warm and dry. No cyanosis. Nails show no clubbing.  Psychiatric: He has a normal mood and affect. His speech is normal and behavior is normal. Judgment and thought content normal.     EKG:  Was ordered and interpreted by me today. Shows NSR, 66 bpm, no acute ST-T changes  Recent Labs: 08/22/2018: ALT 32; BUN 23; Creatinine, Ser 1.18; Potassium 4.1; Pro B Natriuretic peptide (BNP) 22.0; Sodium 139 10/11/2018: Hemoglobin 15.0; Platelets 238.0  No results found for requested labs within last 8760 hours.   CrCl cannot be calculated (Patient's most recent lab result is older than the maximum 21 days allowed.).   Wt Readings from Last 3 Encounters:  02/15/19 188 lb 12 oz (85.6 kg)  10/11/18 184 lb (83.5 kg)  10/02/18 186 lb 12 oz (84.7 kg)     Other studies reviewed: Additional studies/records reviewed today include: summarized above  ASSESSMENT AND PLAN:  1. Shortness of breath: Patient symptoms are atypical for cardiac etiology.  He notes he will be sitting watching TV and feel like he needs to take a deep inspiration.  He continues to ambulate 2 miles per day, every day without any symptoms of angina or shortness of breath.  Prior work-up including laboratory evaluation, chest x-ray, and echo have been mostly unrevealing.  We did offer potential stress testing though  given the stable and atypical symptoms we have agreed to defer this at this time.  Given the patient ambulates 2 miles per day I do not feel like cardiopulmonary stress testing would be of much benefit at this time.  Should he notice a change in symptoms or worsening symptoms we could pursue nuclear stress testing or coronary CTA.  2. Chronic diastolic CHF: Patient appears euvolemic and  well compensated.  Weight is stable.  Remains on low-dose Lasix.  Check CMP.  3. Orthostatic hypotension: No further symptoms of presyncope or syncope since discontinuing hydralazine and decreasing Toprol several years prior.  Continue adequate hydration.  4. Aortic atherosclerosis: Incidentally noted on CT of the abdomen.  Most recent LDL of 90 from 2017.  Check fasting lipid panel and CMP.  Remains on Crestor 20 mg daily.  5. Morbid obesity: Status post gastric bypass.  6. Anemia: In the setting of prior gastric bypass.  Followed by PCP.  Disposition: F/u with Dr. Fletcher Anon or an APP in 6 months.  Current medicines are reviewed at length with the patient today.  The patient did not have any concerns regarding medicines.  Signed, Christell Faith, PA-C 02/15/2019 9:29 AM     [facility] [address] [place] [address] [place], [place] [address] [phone]

## 2019-02-14 NOTE — Telephone Encounter (Signed)

## 2019-02-15 ENCOUNTER — Encounter: Payer: Self-pay | Admitting: Physician Assistant

## 2019-02-15 ENCOUNTER — Other Ambulatory Visit: Payer: Self-pay

## 2019-02-15 ENCOUNTER — Ambulatory Visit (INDEPENDENT_AMBULATORY_CARE_PROVIDER_SITE_OTHER): Payer: Medicare Other | Admitting: Physician Assistant

## 2019-02-15 VITALS — BP 120/80 | HR 66 | Temp 97.2°F | Ht 66.0 in | Wt 188.8 lb

## 2019-02-15 DIAGNOSIS — I5032 Chronic diastolic (congestive) heart failure: Secondary | ICD-10-CM

## 2019-02-15 DIAGNOSIS — I951 Orthostatic hypotension: Secondary | ICD-10-CM | POA: Diagnosis not present

## 2019-02-15 DIAGNOSIS — I700 Atherosclerosis of aorta: Secondary | ICD-10-CM

## 2019-02-15 DIAGNOSIS — R0602 Shortness of breath: Secondary | ICD-10-CM | POA: Diagnosis not present

## 2019-02-15 DIAGNOSIS — Z9884 Bariatric surgery status: Secondary | ICD-10-CM

## 2019-02-15 DIAGNOSIS — E785 Hyperlipidemia, unspecified: Secondary | ICD-10-CM | POA: Diagnosis not present

## 2019-02-15 DIAGNOSIS — D509 Iron deficiency anemia, unspecified: Secondary | ICD-10-CM

## 2019-02-15 NOTE — Patient Instructions (Signed)
Medication Instructions:  No changes  If you need a refill on your cardiac medications before your next appointment, please call your pharmacy.   Lab work: CMET & Lipid panel done today.  If you have labs (blood work) drawn today and your tests are completely normal, you will receive your results only by: Marland Kitchen MyChart Message (if you have MyChart) OR . A paper copy in the mail If you have any lab test that is abnormal or we need to change your treatment, we will call you to review the results.  Testing/Procedures: None  Follow-Up: At [facility], you and your health needs are our priority.  As part of our continuing mission to provide you with exceptional heart care, we have created designated Provider Care Teams.  These Care Teams include your primary Cardiologist (physician) and Advanced Practice Providers (APPs -  Physician Assistants and Nurse Practitioners) who all work together to provide you with the care you need, when you need it. You will need a follow up appointment in 6 months.  Please call our office 2 months in advance to schedule this appointment.  You may see Dr. Fletcher Anon or one of the following Advanced Practice Providers on your designated Care Team:   Murray Hodgkins, NP Christell Faith, PA-C . Marrianne Mood, PA-C  Any Other Special Instructions Will Be Listed Below (If Applicable).

## 2019-02-16 ENCOUNTER — Telehealth: Payer: Self-pay | Admitting: Family Medicine

## 2019-02-16 ENCOUNTER — Telehealth: Payer: Self-pay | Admitting: Cardiovascular Disease

## 2019-02-16 DIAGNOSIS — E119 Type 2 diabetes mellitus without complications: Secondary | ICD-10-CM

## 2019-02-16 DIAGNOSIS — E785 Hyperlipidemia, unspecified: Secondary | ICD-10-CM

## 2019-02-16 LAB — COMPREHENSIVE METABOLIC PANEL
ALT: 32 IU/L (ref 0–44)
AST: 22 IU/L (ref 0–40)
Albumin/Globulin Ratio: 1.9 (ref 1.2–2.2)
Albumin: 4.5 g/dL (ref 3.8–4.8)
Alkaline Phosphatase: 57 IU/L (ref 39–117)
BUN/Creatinine Ratio: 26 — ABNORMAL HIGH (ref 10–24)
BUN: 29 mg/dL — ABNORMAL HIGH (ref 8–27)
Bilirubin Total: 0.3 mg/dL (ref 0.0–1.2)
CO2: 22 mmol/L (ref 20–29)
Calcium: 9.9 mg/dL (ref 8.6–10.2)
Chloride: 100 mmol/L (ref 96–106)
Creatinine, Ser: 1.12 mg/dL (ref 0.76–1.27)
GFR calc Af Amer: 78 mL/min/{1.73_m2} (ref >59)
GFR calc non Af Amer: 67 mL/min/{1.73_m2} (ref >59)
Globulin, Total: 2.4 g/dL (ref 1.5–4.5)
Glucose: 166 mg/dL — ABNORMAL HIGH (ref 65–99)
Potassium: 4.7 mmol/L (ref 3.5–5.2)
Sodium: 142 mmol/L (ref 134–144)
Total Protein: 6.9 g/dL (ref 6.0–8.5)

## 2019-02-16 LAB — LIPID PANEL
Chol/HDL Ratio: 2.9 ratio (ref 0.0–5.0)
Cholesterol, Total: 173 mg/dL (ref 100–199)
HDL: 60 mg/dL (ref >39)
LDL Calculated: 80 mg/dL (ref 0–99)
Triglycerides: 163 mg/dL — ABNORMAL HIGH (ref 0–149)
VLDL Cholesterol Cal: 33 mg/dL (ref 5–40)

## 2019-02-16 MED ORDER — ROSUVASTATIN CALCIUM 40 MG PO TABS: 40.0000 mg | ORAL_TABLET | Freq: Every day | ORAL | 1 refills | Status: DC

## 2019-02-16 NOTE — Telephone Encounter (Signed)
Spoke with the pt. Pt made aware of lab results and Ryan's recommendation. Pt is agreeable with increasing Crestor to 40mg  daily. Pt has recently picked up a 90day supply of the 20mg . Adv the pt to take 2 tablets. Rx sent to the pt pharmacy for the 40mg  tab with a message that the pt will pick it up when needed. Order in [place] for 2 month fasting lipid and lft. [facility]. Pt verbalized understanding and voiced appreciation for the call.

## 2019-02-16 NOTE — Telephone Encounter (Signed)
Patient would like a call back to go over lab results from 7/9 labs.  Please advise

## 2019-02-16 NOTE — Telephone Encounter (Signed)
-----   Message from Rise Mu, PA-C sent at 02/16/2019  8:56 AM EDT ----- Random glucose is elevated - known diabetes.  Renal function normal.  Potassium at goal.  Protein level normal.  Liver function normal.  LDL (bad cholesterol) is reasonably controlled, though with noted incidental aortic atherosclerosis, we should increase Crestor to 40 mg daily in an effort to get the LDL < 70.  Recheck fasting lipid panel and LFT in 2 months.

## 2019-02-16 NOTE — Telephone Encounter (Signed)
Copied from [place] [phone]. Topic: General - Other >> Feb 16, 2019 11:24 AM Keene Breath wrote: Reason for CRM: Patient would like to have the nurse call him regarding the Flomax that he use to take.  Patient said his heart doctor  had taken him off for about a year and now he is experiencing some urine flow issues.  Patient said he thinks he should go back on the Flomax.  Please advise.  CB# [phone]

## 2019-02-19 NOTE — Telephone Encounter (Signed)
I have not seen the patient in about 7 months.  Please see if he would be willing to do a virtual visit to follow-up and we can discuss this at that time.

## 2019-02-19 NOTE — Telephone Encounter (Signed)
This call is about regarding the Flomax that he use to take.  Patient said his heart doctor  had taken him off for about a year and now he is experiencing some urine flow issues.  Patient said he thinks he should go back on the Flomax.  Please advise.

## 2019-02-20 ENCOUNTER — Ambulatory Visit: Payer: Medicare Other | Admitting: Family Medicine

## 2019-02-20 NOTE — Telephone Encounter (Signed)
Called and scheduled appt for the provider to see patient about the medication requested.  Nina,cma

## 2019-02-27 ENCOUNTER — Other Ambulatory Visit: Payer: Self-pay

## 2019-02-27 ENCOUNTER — Ambulatory Visit (INDEPENDENT_AMBULATORY_CARE_PROVIDER_SITE_OTHER): Payer: Medicare Other | Admitting: Family Medicine

## 2019-02-27 ENCOUNTER — Encounter: Payer: Self-pay | Admitting: Family Medicine

## 2019-02-27 DIAGNOSIS — N401 Enlarged prostate with lower urinary tract symptoms: Secondary | ICD-10-CM

## 2019-02-27 DIAGNOSIS — G8929 Other chronic pain: Secondary | ICD-10-CM

## 2019-02-27 DIAGNOSIS — R351 Nocturia: Secondary | ICD-10-CM

## 2019-02-27 DIAGNOSIS — I10 Essential (primary) hypertension: Secondary | ICD-10-CM | POA: Diagnosis not present

## 2019-02-27 DIAGNOSIS — E119 Type 2 diabetes mellitus without complications: Secondary | ICD-10-CM

## 2019-02-27 DIAGNOSIS — R109 Unspecified abdominal pain: Secondary | ICD-10-CM

## 2019-02-27 DIAGNOSIS — D7589 Other specified diseases of blood and blood-forming organs: Secondary | ICD-10-CM | POA: Diagnosis not present

## 2019-02-27 DIAGNOSIS — M5136 Other intervertebral disc degeneration, lumbar region: Secondary | ICD-10-CM

## 2019-02-27 HISTORY — DX: Other chronic pain: G89.29

## 2019-02-27 MED ORDER — TAMSULOSIN HCL 0.4 MG PO CAPS: 0.4000 mg | ORAL_CAPSULE | Freq: Every day | ORAL | 3 refills | Status: DC

## 2019-02-27 NOTE — Assessment & Plan Note (Signed)
Seems to be well controlled based on home CBGs.  We will check an A1c.  He will continue Tradjenta.

## 2019-02-27 NOTE — Assessment & Plan Note (Signed)
Symptoms consistent with BPH.  We will restart his Flomax.  If not improving he will let us know.  He will monitor for lightheadedness with this medication and contact us if this occurs.

## 2019-02-27 NOTE — Assessment & Plan Note (Signed)
This continues to be an issue.  Could be adhesion related or related to some other GI cause.  Will refer back to GI and consider general surgery evaluation if needed after GI evaluation.

## 2019-02-27 NOTE — Progress Notes (Signed)
Virtual Visit via telephone Note  This visit type was conducted due to national recommendations for restrictions regarding the COVID-19 pandemic (e.g. social distancing).  This format is felt to be most appropriate for this patient at this time.  All issues noted in this document were discussed and addressed.  No physical exam was performed (except for noted visual exam findings with Video Visits).   I connected with Justin Patel today at 11:00 AM EDT by telephone and verified that I am speaking with the correct person using two identifiers. Location patient: home Location provider: work Persons participating in the virtual visit: patient, provider  I discussed the limitations, risks, security and privacy concerns of performing an evaluation and management service by telephone and the availability of in person appointments. I also discussed with the patient that there may be a patient responsible charge related to this service. The patient expressed understanding and agreed to proceed.  Interactive audio and video telecommunications were attempted between this provider and patient, however failed, due to patient having technical difficulties OR patient did not have access to video capability.  We continued and completed visit with audio only.   Reason for visit: follow-up  HPI: BPH: Patient notes for the last several weeks he has had a mildly weak urinary stream and is urinating 4 times nightly.  He noted some discomfort with this initially though that has resolved.  He notes the weak stream has improved slightly.  He notes no hematuria.  He feels as though he empties his bladder.  There is no foamy urine.  He was on Flomax so he reports urology took him off of this though that does not appear to be documented in their most recent note.  Diabetes: Typically running 80s-120s.  Taking Tradjenta.  No polydipsia.  No hypoglycemia.  Hypertension: Typically running 120s over 80s.  Taking  losartan, Lasix, and metoprolol.  No chest pain.  Chronic low back pain: Patient notes his low back pain has been eliminated status post surgical intervention.  He continues to follow with his neurosurgeon.  Chronic abdominal pain: Patient notes he continues to have abdominal discomfort in his lower abdomen near the scar from his appendectomy.  He describes this as feeling as though there are needles sticking in that area.  He has had multiple CT scans evaluating this with no obvious cause.  He did undergo colonoscopy and EGD previously with no obvious cause.  He has not followed up with GI.  He has had numerous abdominal surgeries.   ROS: See pertinent positives and negatives per HPI.  Past Medical History:  Diagnosis Date  . 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 Anxiety   . Arthritis   . 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  removal scheduled to clear margins.  . CHF (congestive heart failure) ([place])    no longer after weight loss  . Chronic kidney disease    cysts  . Degenerative disc disease   . Diabetes mellitus    no longer diabetic-or on meds  . Family history of adverse reaction to anesthesia    sons wake up combative  . Gastric ulcer   . GERD (gastroesophageal reflux disease)   . H/O hiatal hernia   . Headache(784.0)    sinus  . Heart murmur   . Hx of congestive heart failure   . Hyperlipidemia   . Hypertension   . Iron deficiency anemia 02/28/2015  .  Opiate abuse, continuous ([place]) 06/20/2015  . Sacral fracture, closed ([place])   . Seizures ([place])    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, after GI bleed  . Sleep apnea    hx not now since wt loss  . Stones in the urinary tract   . Syncope and collapse     Past Surgical History:  Procedure Laterality Date  . ANTERIOR CERVICAL DECOMP/DISCECTOMY FUSION  01/26/2012   Procedure: ANTERIOR CERVICAL DECOMPRESSION/DISCECTOMY FUSION 3 LEVELS;  Surgeon: Floyce Stakes, MD;   Location: [facility];  Service: Neurosurgery;  Laterality: N/A;  Cervical three-four Cervical four-five Cervical five-six Cervical six-seven , Anterior cervical decompression/diskectomy, fusion, plate  . ANTERIOR LAT LUMBAR FUSION Right 04/25/2018   Procedure: Right Lumbar two-three Lumbar three-four Lumbar four-five anterior  lateral interbody fusion  with posterior percutaneous pedicle screws;  Surgeon: Erline Levine, MD;  Location: [place];  Service: Neurosurgery;  Laterality: Right;  . APPENDECTOMY    . [place], normal  . CARDIAC CATHETERIZATION     [place]  . COLONOSCOPY WITH PROPOFOL N/A 12/27/2017   Procedure: COLONOSCOPY WITH PROPOFOL;  Surgeon: Toledo, Benay Pike, MD;  Location: [facility];  Service: Gastroenterology;  Laterality: N/A;  . ESOPHAGOGASTRODUODENOSCOPY (EGD) WITH PROPOFOL N/A 12/27/2017   Procedure: ESOPHAGOGASTRODUODENOSCOPY (EGD) WITH PROPOFOL;  Surgeon: Toledo, Benay Pike, MD;  Location: [facility];  Service: Gastroenterology;  Laterality: N/A;  . GASTRIC BYPASS  2010   [place]  . HERNIA REPAIR  2000   hiatal  . JOINT REPLACEMENT    . KNEE ARTHROSCOPY     bilateral, left x 2  . LUMBAR PERCUTANEOUS PEDICLE SCREW 3 LEVEL N/A 04/25/2018   Procedure: LUMBAR PERCUTANEOUS PEDICLE SCREW 3 LEVEL;  Surgeon: Erline Levine, MD;  Location: [place];  Service: Neurosurgery;  Laterality: N/A;  . NASAL SINUS SURGERY     x5  . OSTEOTOMY  2001   left  . TONSILLECTOMY    . TOTAL KNEE ARTHROPLASTY Left 05/2014   Dr. Marry Guan  . UVULOPALATOPLASTY  2011    Family History  Problem Relation Age of Onset  . Dementia Mother 43  . Heart disease Father 68  . Diabetes Father   . Lymphoma Sister        lymphoma, stage 4  . Ovarian cancer Sister 63       Ovarian  . Lupus Sister   . Prostate cancer Maternal Uncle   . Skin cancer Maternal Uncle   . Tongue cancer Maternal Uncle        uncle died of heart attack  . Alcoholism  Maternal Uncle   . Lung cancer Paternal Aunt        pat aunts x 4 died of lung cancer  . Lung cancer Paternal Aunt   . Lung cancer Paternal Aunt   . Lung cancer Paternal Aunt   . Mesothelioma Cousin        paternal cousin    SOCIAL HX: Non-smoker.   Current Outpatient Medications:  .  acetaminophen (TYLENOL) 500 MG tablet, Take 1,000 mg by mouth 3 (three) times daily as needed for moderate pain or headache. , Disp: , Rfl:  .  blood glucose meter kit and supplies KIT, Dispense based on patient and insurance preference. Check once daily. ICD doing E11.9., Disp: 1 each, Rfl: 0 .  diphenhydrAMINE (BENADRYL) 25 MG tablet, Take 25 mg by mouth daily as needed for allergies., Disp: , Rfl:  .  DULoxetine (CYMBALTA) 60  MG capsule, TAKE 1 CAPSULE EVERY DAY, Disp: 90 capsule, Rfl: 1 .  esomeprazole (NEXIUM) 40 MG capsule, TAKE 1 CAPSULE BY MOUTH ONCE DAILY, Disp: 90 capsule, Rfl: 1 .  furosemide (LASIX) 20 MG tablet, TAKE 1 TABLET BY MOUTH ONCE DAILY, Disp: 90 tablet, Rfl: 0 .  losartan (COZAAR) 100 MG tablet, TAKE 1 TABLET BY MOUTH DAILY, Disp: 90 tablet, Rfl: 0 .  methocarbamol (ROBAXIN) 750 MG tablet, Take 1 tablet (750 mg total) by mouth every 6 (six) hours as needed for muscle spasms., Disp: 60 tablet, Rfl: 1 .  metoprolol succinate (TOPROL-XL) 50 MG 24 hr tablet, TAKE 1 TABLET BY MOUTH WITH OR IMMEDIATLY FOLLOWING A MEAL, Disp: 30 tablet, Rfl: 0 .  Multiple Vitamin (MULTIVITAMIN) capsule, Take 1 capsule by mouth daily. With copper and zinc, Disp: , Rfl:  .  nystatin cream (MYCOSTATIN), Apply 1 application topically 2 (two) times daily. (Patient taking differently: Apply 1 application topically 2 (two) times daily as needed (dermatitis). ), Disp: 30 g, Rfl: 0 .  nystatin-triamcinolone ointment (MYCOLOG), Apply 1 application topically 2 (two) times daily., Disp: 30 g, Rfl: 0 .  rosuvastatin (CRESTOR) 40 MG tablet, Take 1 tablet (40 mg total) by mouth daily., Disp: 90 tablet, Rfl: 1 .  sucralfate  (CARAFATE) 1 g tablet, TAKE ONE TABLET BY MOUTH FOUR TIMES DAILY, Disp: 360 tablet, Rfl: 1 .  TRADJENTA 5 MG TABS tablet, TAKE ONE TABLET EVERY DAY, Disp: 90 tablet, Rfl: 1 .  traMADol (ULTRAM) 50 MG tablet, Take by mouth every 6 (six) hours as needed. Take every 6 hours ( 4 times daily) as needed for pain, Disp: , Rfl:  .  Vitamin D, Ergocalciferol, (DRISDOL) 1.25 MG (50000 UT) CAPS capsule, TAKE 1 CAPSULE EVERY 7 DAYS, Disp: 8 capsule, Rfl: 0 .  tamsulosin (FLOMAX) 0.4 MG CAPS capsule, Take 1 capsule (0.4 mg total) by mouth daily., Disp: 90 capsule, Rfl: 3  EXAM: This was a telehealth telephone visit and thus no physical exam was completed.   ASSESSMENT AND PLAN:  Discussed the following assessment and plan:  BPH (benign prostatic hyperplasia) Symptoms consistent with BPH.  We will restart his Flomax.  If not improving he will let us know.  He will monitor for lightheadedness with this medication and contact us if this occurs.  Diabetes ([place]) Seems to be well controlled based on home CBGs.  We will check an A1c.  He will continue Tradjenta.  Essential hypertension, benign Well-controlled.  Continue current regimen.  Macrocytosis Check CBC.  DDD (degenerative disc disease), lumbar Patient is doing well status post surgery.  He will continue to follow with his neurosurgeon.  Chronic abdominal pain This continues to be an issue.  Could be adhesion related or related to some other GI cause.  Will refer back to GI and consider general surgery evaluation if needed after GI evaluation.   Social distancing precautions and sick precautions given regarding COVID-19.   I discussed the assessment and treatment plan with the patient. The patient was provided an opportunity to ask questions and all were answered. The patient agreed with the plan and demonstrated an understanding of the instructions.   The patient was advised to call back or seek an in-person evaluation if the symptoms worsen  or if the condition fails to improve as anticipated.  I provided 22 minutes of non-face-to-face time during this encounter.   [name], MD

## 2019-02-27 NOTE — Assessment & Plan Note (Signed)
Patient is doing well status post surgery.  He will continue to follow with his neurosurgeon.

## 2019-02-27 NOTE — Assessment & Plan Note (Signed)
Well-controlled.  Continue current regimen. 

## 2019-02-27 NOTE — Assessment & Plan Note (Signed)
Check CBC 

## 2019-03-14 ENCOUNTER — Other Ambulatory Visit: Payer: Self-pay

## 2019-03-14 ENCOUNTER — Other Ambulatory Visit (INDEPENDENT_AMBULATORY_CARE_PROVIDER_SITE_OTHER): Payer: Medicare Other

## 2019-03-14 DIAGNOSIS — D7589 Other specified diseases of blood and blood-forming organs: Secondary | ICD-10-CM | POA: Diagnosis not present

## 2019-03-14 DIAGNOSIS — E785 Hyperlipidemia, unspecified: Secondary | ICD-10-CM

## 2019-03-14 DIAGNOSIS — E119 Type 2 diabetes mellitus without complications: Secondary | ICD-10-CM

## 2019-03-14 LAB — CBC WITH DIFFERENTIAL/PLATELET
Basophils Absolute: 0 10*3/uL (ref 0.0–0.1)
Basophils Relative: 0.5 % (ref 0.0–3.0)
Eosinophils Absolute: 0.2 10*3/uL (ref 0.0–0.7)
Eosinophils Relative: 2.4 % (ref 0.0–5.0)
HCT: 42.2 % (ref 39.0–52.0)
Hemoglobin: 13.9 g/dL (ref 13.0–17.0)
Lymphocytes Relative: 33.4 % (ref 12.0–46.0)
Lymphs Abs: 2.5 10*3/uL (ref 0.7–4.0)
MCHC: 33 g/dL (ref 30.0–36.0)
MCV: 100.7 fl — ABNORMAL HIGH (ref 78.0–100.0)
Monocytes Absolute: 0.6 10*3/uL (ref 0.1–1.0)
Monocytes Relative: 8.2 % (ref 3.0–12.0)
Neutro Abs: 4.2 10*3/uL (ref 1.4–7.7)
Neutrophils Relative %: 55.5 % (ref 43.0–77.0)
Platelets: 189 10*3/uL (ref 150.0–400.0)
RBC: 4.2 Mil/uL — ABNORMAL LOW (ref 4.22–5.81)
RDW: 14.1 % (ref 11.5–15.5)
WBC: 7.5 10*3/uL (ref 4.0–10.5)

## 2019-03-14 LAB — HEMOGLOBIN A1C: Hgb A1c MFr Bld: 6.9 % — ABNORMAL HIGH (ref 4.6–6.5)

## 2019-03-15 ENCOUNTER — Telehealth: Payer: Self-pay | Admitting: Family Medicine

## 2019-03-15 ENCOUNTER — Telehealth: Payer: Self-pay | Admitting: Physician Assistant

## 2019-03-15 DIAGNOSIS — E785 Hyperlipidemia, unspecified: Secondary | ICD-10-CM

## 2019-03-15 LAB — HEPATIC FUNCTION PANEL
ALT: 25 IU/L (ref 0–44)
AST: 21 IU/L (ref 0–40)
Albumin: 4.1 g/dL (ref 3.8–4.8)
Alkaline Phosphatase: 56 IU/L (ref 39–117)
Bilirubin Total: 0.2 mg/dL (ref 0.0–1.2)
Bilirubin, Direct: 0.08 mg/dL (ref 0.00–0.40)
Total Protein: 6.5 g/dL (ref 6.0–8.5)

## 2019-03-15 LAB — LIPID PANEL
Chol/HDL Ratio: 2.7 ratio (ref 0.0–5.0)
Cholesterol, Total: 171 mg/dL (ref 100–199)
HDL: 64 mg/dL (ref >39)
LDL Calculated: 77 mg/dL (ref 0–99)
Triglycerides: 150 mg/dL — ABNORMAL HIGH (ref 0–149)
VLDL Cholesterol Cal: 30 mg/dL (ref 5–40)

## 2019-03-15 LAB — SPECIMEN STATUS REPORT

## 2019-03-15 NOTE — Telephone Encounter (Signed)
Pt states he is returning a call re: lab results.  Please call pt: [phone]

## 2019-03-15 NOTE — Telephone Encounter (Signed)
I called and spoke with [employer] [phone], Roselyn Reef (rep). I advised that the patient had a lipid/ liver drawn yesterday by Dr. Ellen Henri office and this was not supposed to be drawn until 04/2019. I advised we needed to try make sure the patient did not get charged for this.   Per Roselyn Reef, there was no documentation on the lab req that this needed to be done in September.  She is going to email our sales rep, Willadean Carol ([email or web address]) and let him know what is going on to see if this can be credited to the patient.  Shanda Howells, office manager, made aware.  Staff message forwarded to her as well.   Lipid/ liver re-entered for (04/2019).

## 2019-03-15 NOTE — Telephone Encounter (Signed)
Patient inquiring lab results, please advise

## 2019-03-15 NOTE — Telephone Encounter (Signed)
Notes recorded by Rise Mu, PA-C on 03/15/2019 at 7:09 AM EDT  These were not supposed to be drawn until 04/2019. Look like the lab released them early. Can you contact the lab and ensure the patient does not get charged for these two tests?

## 2019-03-16 NOTE — Telephone Encounter (Signed)
I sent a message with his labs. The A1c and the CBC look ok and he does not need to make any changes at this time.

## 2019-03-16 NOTE — Telephone Encounter (Signed)
Patient wanted his results he stated he saw them on my chart, do you have any instructions for him according to his labs.  Shelvie Salsberry,cma

## 2019-03-20 NOTE — Telephone Encounter (Signed)
Called and spoke to patient and informed him that he is to keep his current regimen.  Lore Polka,cma

## 2019-03-21 ENCOUNTER — Ambulatory Visit (INDEPENDENT_AMBULATORY_CARE_PROVIDER_SITE_OTHER): Payer: Medicare Other | Admitting: Family Medicine

## 2019-03-21 ENCOUNTER — Other Ambulatory Visit: Payer: Self-pay

## 2019-03-21 ENCOUNTER — Encounter: Payer: Self-pay | Admitting: Family Medicine

## 2019-03-21 DIAGNOSIS — N401 Enlarged prostate with lower urinary tract symptoms: Secondary | ICD-10-CM

## 2019-03-21 DIAGNOSIS — R1031 Right lower quadrant pain: Secondary | ICD-10-CM

## 2019-03-21 DIAGNOSIS — E119 Type 2 diabetes mellitus without complications: Secondary | ICD-10-CM

## 2019-03-21 DIAGNOSIS — R7612 Nonspecific reaction to cell mediated immunity measurement of gamma interferon antigen response without active tuberculosis: Secondary | ICD-10-CM | POA: Diagnosis not present

## 2019-03-21 DIAGNOSIS — R21 Rash and other nonspecific skin eruption: Secondary | ICD-10-CM | POA: Diagnosis not present

## 2019-03-21 DIAGNOSIS — I10 Essential (primary) hypertension: Secondary | ICD-10-CM

## 2019-03-21 DIAGNOSIS — R61 Generalized hyperhidrosis: Secondary | ICD-10-CM | POA: Insufficient documentation

## 2019-03-21 DIAGNOSIS — R351 Nocturia: Secondary | ICD-10-CM

## 2019-03-21 MED ORDER — AMLODIPINE BESYLATE 5 MG PO TABS: 5.0000 mg | ORAL_TABLET | Freq: Every day | ORAL | 3 refills | Status: DC

## 2019-03-21 NOTE — Assessment & Plan Note (Signed)
Well-controlled.  He will continue Tradjenta.  He is having some low sugars and that may be contributing to his night sweats.  I discussed eating a good dinner and having a snack prior to bed to see if that made a difference.

## 2019-03-21 NOTE — Patient Instructions (Signed)
Nice to see you. Please call the number below to talk to the health department regarding your positive TB test and potential treatment. They should have records of your prior testing. [phone] Please make sure you eat a good meal at night to keep your sugars up. It may be helpful to eat a snack prior to bed to see if that helps.  I will get you referred to a different dermatologist to get their input on your skin lesions.

## 2019-03-21 NOTE — Assessment & Plan Note (Signed)
Doing well on Flomax.  He will continue this.

## 2019-03-21 NOTE — Assessment & Plan Note (Signed)
I suspect this is related to his glucose dropping at night.  Discussed eating a good dinner and having a snack prior to bed to try to prevent this.  It would seem unlikely that this would be related to his prior positive TB testing though I did encourage him to follow through with the health department for treatment.

## 2019-03-21 NOTE — Assessment & Plan Note (Signed)
Patient never completed treatment for this.  It would seem unlikely that the night sweats would be related to this given that he has had negative imaging.  I did encourage him to follow through with treatment through the health department and provided him with the number to call to get this scheduled.

## 2019-03-21 NOTE — Assessment & Plan Note (Signed)
He continues to have issues with rash.  He will check with his wife to see who she is seen for dermatology and he will contact us to let us know so we can place a referral.

## 2019-03-21 NOTE — Assessment & Plan Note (Signed)
We will add amlodipine.  He will continue his other medication.  Follow-up in 6 weeks for BP check.

## 2019-03-21 NOTE — Progress Notes (Signed)
Justin Rumps, MD Phone: [phone]  Justin Patel is a 68 y.o. male who presents today for follow-up.  Hypertension: Typically running 130s over 90.  He is taking losartan, metoprolol, and Lasix.  No chest pain, shortness breath, or edema.  Diabetes: Typically running 80-120 though he has had some lows into the 70s at nights.  He is taking Tradjenta.  Due to see ophthalmology.  He is trying to eat very healthfully.  BPH: Taking Flomax with good benefit.  No straining.  He has good flow.  He gets up once nightly for urination.  Some increased frequency during the day.  No urgency.  No dysuria.  Chronic abdominal pain: He continues to have this in his right lower quadrant.  He saw GI and they recommended having him see a surgeon to determine if this could be adhesion issues.  Describes it as stabbing and aching.  It has not changed.  He has daily bowel movements.  No blood in his stool.  The pain does not occur specifically with eating.  Night sweats: Patient has had these intermittently in the past though notes they have returned over the last year or so.  At times he will wake up soaked at night.  He will check his sugar and it can be down into the 70s.  He does note he will eat something and the sweating will resolve.  He is eating good dinners at night.  He has no cough or fevers.  He has checked his temperature has been normal with this.  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  Patient underwent evaluation with hematology for this and had a negative bone marrow biopsy.  He underwent chest CT and CT abdomen and pelvis with no obvious cause.  He is up-to-date on colon cancer screening.  Rash: He continues to note linear rash on his lower extremities.  He notes they look like scratches though he knows he has not scratched himself.  He discussed with dermatology previously and states they advised and they were  scratches.  Social History   Tobacco Use  Smoking Status Never Smoker  Smokeless Tobacco Never Used     ROS see history of present illness  Objective  Physical Exam Vitals:   03/21/19 1028  BP: 140/90  Pulse: 71  Temp: 98 F (36.7 C)  SpO2: 94%    BP Readings from Last 3 Encounters:  03/21/19 140/90  02/15/19 120/80  10/11/18 128/86   Wt Readings from Last 3 Encounters:  03/21/19 194 lb (88 kg)  02/27/19 184 lb (83.5 kg)  02/15/19 188 lb 12 oz (85.6 kg)    Physical Exam Constitutional:      General: He is not in acute distress.    Appearance: He is not diaphoretic.  Cardiovascular:     Rate and Rhythm: Normal rate and regular rhythm.     Heart sounds: Normal heart sounds.  Pulmonary:     Effort: Pulmonary effort is normal.     Breath sounds: Normal breath sounds.  Abdominal:     General: Bowel sounds are normal. There is no distension.     Palpations: Abdomen is soft.     Tenderness: There is no abdominal tenderness. There is no guarding or rebound.  Musculoskeletal:     Right lower leg: No edema.     Left lower leg: No edema.  Skin:    General: Skin is warm and  dry.     Comments: Scattered linear scratches on the dorsum of his left foot  Neurological:     Mental Status: He is alert.      Assessment/Plan: Please see individual problem list.  Essential hypertension, benign We will add amlodipine.  He will continue his other medication.  Follow-up in 6 weeks for BP check.  Diabetes ([place]) Well-controlled.  He will continue Tradjenta.  He is having some low sugars and that may be contributing to his night sweats.  I discussed eating a good dinner and having a snack prior to bed to see if that made a difference.  Rash He continues to have issues with rash.  He will check with his wife to see who she is seen for dermatology and he will contact us to let us know so we can place a referral.  Positive QuantiFERON-TB Gold test Patient never completed  treatment for this.  It would seem unlikely that the night sweats would be related to this given that he has had negative imaging.  I did encourage him to follow through with treatment through the health department and provided him with the number to call to get this scheduled.  Chronic night sweats I suspect this is related to his glucose dropping at night.  Discussed eating a good dinner and having a snack prior to bed to try to prevent this.  It would seem unlikely that this would be related to his prior positive TB testing though I did encourage him to follow through with the health department for treatment.  RLQ abdominal pain I suspect likely adhesions.  He has seen GI.  They recommended general surgery evaluation.  Patient has an appointment with him later this week.    BPH (benign prostatic hyperplasia) Doing well on Flomax.  He will continue this.   No orders of the defined types were placed in this encounter.   Meds ordered this encounter  Medications  . amLODipine (NORVASC) 5 MG tablet    Sig: Take 1 tablet (5 mg total) by mouth daily.    Dispense:  90 tablet    Refill:  [place], MD [place]

## 2019-03-21 NOTE — Assessment & Plan Note (Signed)
I suspect likely adhesions.  He has seen GI.  They recommended general surgery evaluation.  Patient has an appointment with him later this week.

## 2019-03-22 ENCOUNTER — Other Ambulatory Visit: Payer: Self-pay | Admitting: Family Medicine

## 2019-03-22 DIAGNOSIS — R600 Localized edema: Secondary | ICD-10-CM

## 2019-03-28 ENCOUNTER — Other Ambulatory Visit: Payer: Self-pay | Admitting: Family Medicine

## 2019-03-29 ENCOUNTER — Telehealth: Payer: Self-pay

## 2019-03-29 MED ORDER — AMLODIPINE BESYLATE 5 MG PO TABS: 5.0000 mg | ORAL_TABLET | Freq: Every day | ORAL | 3 refills | Status: DC

## 2019-03-29 NOTE — Telephone Encounter (Signed)
Copied from [place] [id number]. Topic: General - Other >> Mar 29, 2019 12:37 PM Yvette Rack wrote: Reason for CRM: Pt stated [facility] has not received the Rx for amLODipine (NORVASC) 5 MG tablet. Pt asked that the Rx request be sent asap.

## 2019-03-29 NOTE — Telephone Encounter (Signed)
Resent Rx for amlodipine.  Confirmed receipt w/ [facility].  Left vm for pt making aware.

## 2019-03-31 ENCOUNTER — Other Ambulatory Visit: Payer: Self-pay | Admitting: Family Medicine

## 2019-04-02 ENCOUNTER — Other Ambulatory Visit: Payer: Self-pay

## 2019-04-02 ENCOUNTER — Ambulatory Visit (INDEPENDENT_AMBULATORY_CARE_PROVIDER_SITE_OTHER): Payer: Medicare Other

## 2019-04-02 DIAGNOSIS — Z0000 Encounter for general adult medical examination without abnormal findings: Secondary | ICD-10-CM

## 2019-04-02 NOTE — Patient Instructions (Addendum)
  Justin Patel , Thank you for taking time to come for your Medicare Wellness Visit. I appreciate your ongoing commitment to your health goals. Please review the following plan we discussed and let me know if I can assist you in the future.   These are the goals we discussed: Goals      Patient Stated   . Weight (lb) < 185 lb (83.9 kg) (pt-stated)     Stay active  Add swimming       This is a list of the screening recommended for you and due dates:  Health Maintenance  Topic Date Due  . Eye exam for diabetics  03/08/2019  . Flu Shot  03/10/2019  . Complete foot exam   05/09/2019  . Hemoglobin A1C  09/14/2019  . Tetanus Vaccine  06/03/2024  . Colon Cancer Screening  12/28/2027  .  Hepatitis C: One time screening is recommended by Center for Disease Control  (CDC) for  adults born from [birth year] through 1965.   Completed  . Pneumonia vaccines  Completed

## 2019-04-02 NOTE — Progress Notes (Signed)
Subjective:   Justin Patel is a 68 y.o. male who presents for Medicare Annual/Subsequent preventive examination.  Review of Systems:  No ROS.  Medicare Wellness Virtual Visit.  Visual/audio telehealth visit, UTA vital signs.   See social history for additional risk factors.   Cardiac Risk Factors include: advanced age (>73mn, >>30women);male gender;hypertension;diabetes mellitus     Objective:    Vitals: There were no vitals taken for this visit.  There is no height or weight on file to calculate BMI.  Advanced Directives 04/02/2019 07/05/2018 04/17/2018 03/31/2018 02/06/2018 01/23/2018 12/27/2017  Does Patient Have a Medical Advance Directive? Yes No No Yes No No Yes  Type of [place] - - [place] Does patient want to make changes to medical advance directive? No - Patient declined - - No - Patient declined - - -  Copy of HHebronin Chart? No - copy requested - No - copy requested No - copy requested - - -  Would patient like information on creating a medical advance directive? - No - Patient declined - - No - Patient declined - No - Patient declined  Pre-existing out of facility DNR order (yellow form or pink MOST form) - - - - - - -    Tobacco Social History   Tobacco Use  Smoking Status Never Smoker  Smokeless Tobacco Never Used     Counseling given: Not Answered   Clinical Intake:  Pre-visit preparation completed: Yes        Diabetes: Yes(Followed by pcp)  How often do you need to have someone help you when you read instructions, pamphlets, or other written materials from your doctor or pharmacy?: 1 - Never  Interpreter Needed?: No     Past Medical History:  Diagnosis Date  . 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 Anxiety   . Arthritis   . [place]   atypical dysplastic skin bx performed by Dr KNehemiah Massed   removal scheduled to clear margins.  . CHF (congestive heart failure) ([place]    no longer after weight loss  . Chronic kidney disease    cysts  . Degenerative disc disease   . Diabetes mellitus    no longer diabetic-or on meds  . Family history of adverse reaction to anesthesia    sons wake up combative  . Gastric ulcer   . GERD (gastroesophageal reflux disease)   . H/O hiatal hernia   . Headache(784.0)    sinus  . Heart murmur   . Hx of congestive heart failure   . Hyperlipidemia   . Hypertension   . Iron deficiency anemia 02/28/2015  . Opiate abuse, continuous ([facility] 06/20/2015  . Sacral fracture, closed ([place]   . Seizures ([facility]    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, after GI bleed  . Sleep apnea    hx not now since wt loss  . Stones in the urinary tract   . Syncope and collapse    Past Surgical History:  Procedure Laterality Date  . ANTERIOR CERVICAL DECOMP/DISCECTOMY FUSION  01/26/2012   Procedure: ANTERIOR CERVICAL DECOMPRESSION/DISCECTOMY FUSION 3 LEVELS;  Surgeon: EFloyce Stakes MD;  [facility];  Service: Neurosurgery;  Laterality: N/A;  Cervical three-four Cervical four-five Cervical five-six Cervical six-seven , Anterior cervical decompression/diskectomy, fusion, plate  . ANTERIOR LAT LUMBAR FUSION Right 04/25/2018   Procedure: Right  Lumbar two-three Lumbar three-four Lumbar four-five anterior  lateral interbody fusion  with posterior percutaneous pedicle screws;  Surgeon: Erline Levine, MD;  Location: [place];  Service: Neurosurgery;  Laterality: Right;  . APPENDECTOMY    . [place], normal  . CARDIAC CATHETERIZATION     [place]  . COLONOSCOPY WITH PROPOFOL N/A 12/27/2017   Procedure: COLONOSCOPY WITH PROPOFOL;  Surgeon: Toledo, Benay Pike, MD;  Location: [facility];  Service: Gastroenterology;  Laterality: N/A;  . ESOPHAGOGASTRODUODENOSCOPY (EGD) WITH PROPOFOL N/A 12/27/2017   Procedure: ESOPHAGOGASTRODUODENOSCOPY  (EGD) WITH PROPOFOL;  Surgeon: Toledo, Benay Pike, MD;  Location: [facility];  Service: Gastroenterology;  Laterality: N/A;  . GASTRIC BYPASS  2010   [place]  . HERNIA REPAIR  2000   hiatal  . JOINT REPLACEMENT    . KNEE ARTHROSCOPY     bilateral, left x 2  . LUMBAR PERCUTANEOUS PEDICLE SCREW 3 LEVEL N/A 04/25/2018   Procedure: LUMBAR PERCUTANEOUS PEDICLE SCREW 3 LEVEL;  Surgeon: Erline Levine, MD;  Location: [place];  Service: Neurosurgery;  Laterality: N/A;  . NASAL SINUS SURGERY     x5  . OSTEOTOMY  2001   left  . TONSILLECTOMY    . TOTAL KNEE ARTHROPLASTY Left 05/2014   Dr. Marry Guan  . UVULOPALATOPLASTY  2011   Family History  Problem Relation Age of Onset  . Dementia Mother 43  . Osteoporosis Mother   . Heart disease Father 3  . Diabetes Father   . Lymphoma Sister        lymphoma, stage 4  . Ovarian cancer Sister 64       Ovarian  . Lupus Sister   . Prostate cancer Maternal Uncle   . Skin cancer Maternal Uncle   . Tongue cancer Maternal Uncle        uncle died of heart attack  . Alcoholism Maternal Uncle   . Lung cancer Paternal Aunt        pat aunts x 4 died of lung cancer  . Lung cancer Paternal Aunt   . Lung cancer Paternal Aunt   . Lung cancer Paternal Aunt   . Mesothelioma Cousin        paternal cousin   Social History   Socioeconomic History  . Marital status: Married    Spouse name: Not on file  . Number of children: Not on file  . Years of education: Not on file  . Highest education level: Not on file  Occupational History  . Not on file  Social Needs  . Financial resource strain: Not hard at all  . Food insecurity    Worry: Never true    Inability: Never true  . Transportation needs    Medical: No    Non-medical: No  Tobacco Use  . Smoking status: Never Smoker  . Smokeless tobacco: Never Used  Substance and Sexual Activity  . Alcohol use: No  . Drug use: No  . Sexual activity: Yes    Partners: Female  Lifestyle  . Physical  activity    Days per week: 6 days    Minutes per session: 60 min  . Stress: Not at all  Relationships  . Social Herbalist on phone: Not on file    Gets together: Not on file    Attends religious service: Not on file    Active member of club or organization: Not on file    Attends meetings of clubs  or organizations: Not on file    Relationship status: Not on file  Other Topics Concern  . Not on file  Social History Narrative   Lives in [place] with wife, [place]. 2 sons, William Hamburger, Lennette Bihari 63.. 4 grandchildren      Work - Retired, previously taught music in [place] - regular diet, limited quantities after gastric bypass      Exercise - no regular, limited by arthritis in knees, occasional water aerobics    Outpatient Encounter Medications as of 04/02/2019  Medication Sig  . acetaminophen (TYLENOL) 500 MG tablet Take 1,000 mg by mouth 3 (three) times daily as needed for moderate pain or headache.   Marland Kitchen amLODipine (NORVASC) 5 MG tablet Take 1 tablet (5 mg total) by mouth daily.  . blood glucose meter kit and supplies KIT Dispense based on patient and insurance preference. Check once daily. ICD doing E11.9.  . diphenhydrAMINE (BENADRYL) 25 MG tablet Take 25 mg by mouth daily as needed for allergies.  . DULoxetine (CYMBALTA) 60 MG capsule TAKE 1 CAPSULE EVERY DAY  . esomeprazole (NEXIUM) 40 MG capsule TAKE 1 CAPSULE BY MOUTH ONCE DAILY  . furosemide (LASIX) 20 MG tablet TAKE 1 TABLET BY MOUTH DAILY  . losartan (COZAAR) 100 MG tablet TAKE ONE TABLET EVERY DAY  . methocarbamol (ROBAXIN) 750 MG tablet Take 1 tablet (750 mg total) by mouth every 6 (six) hours as needed for muscle spasms.  . metoprolol succinate (TOPROL-XL) 50 MG 24 hr tablet TAKE 1 TABLET BY MOUTH WITH OR IMMEDIATLY FOLLOWING A MEAL  . Multiple Vitamin (MULTIVITAMIN) capsule Take 1 capsule by mouth daily. With copper and zinc  . nystatin-triamcinolone ointment (MYCOLOG) Apply 1  application topically 2 (two) times daily.  . rosuvastatin (CRESTOR) 40 MG tablet Take 1 tablet (40 mg total) by mouth daily.  . sucralfate (CARAFATE) 1 g tablet TAKE ONE TABLET BY MOUTH FOUR TIMES DAILY  . tamsulosin (FLOMAX) 0.4 MG CAPS capsule Take 1 capsule (0.4 mg total) by mouth daily.  . TRADJENTA 5 MG TABS tablet TAKE ONE TABLET EVERY DAY  . traMADol (ULTRAM) 50 MG tablet Take by mouth every 6 (six) hours as needed. Take every 6 hours ( 4 times daily) as needed for pain  . Vitamin D, Ergocalciferol, (DRISDOL) 1.25 MG (50000 UT) CAPS capsule TAKE 1 CAPSULE EVERY 7 DAYS  . nystatin cream (MYCOSTATIN) Apply 1 application topically 2 (two) times daily. (Patient taking differently: Apply 1 application topically 2 (two) times daily as needed (dermatitis). )   No facility-administered encounter medications on file as of 04/02/2019.     Activities of Daily Living In your present state of health, do you have any difficulty performing the following activities: 04/02/2019 04/17/2018  Hearing? Y N  Comment Hearing aids, bilateral -  Vision? N N  Difficulty concentrating or making decisions? N N  Walking or climbing stairs? N Y  Dressing or bathing? N N  Doing errands, shopping? N -  Preparing Food and eating ? N -  Using the Toilet? N -  In the past six months, have you accidently leaked urine? N -  Do you have problems with loss of bowel control? N -  Managing your Medications? N -  Managing your Finances? N -  Housekeeping or managing your Housekeeping? N -  Some recent data might be hidden    Patient Care Team: [name], MD as PCP - General (Family Medicine)  Assessment:   This is a routine wellness examination for [place].  I connected with patient 04/02/19 at 11:00 AM EDT by a video/audio enabled telemedicine application and verified that I am speaking with the correct person using two identifiers. Patient stated full name and DOB. Patient gave permission to continue with  virtual visit. Patient's location was at home and Nurse's location was at [place] office.   Health Maintenance Due: Influenza vaccine 2020- discussed; to be completed in season with doctor or local pharmacy.   Update all pending maintenance due as appropriate.   See completed HM at the end of note.   Eye: Visual acuity not assessed. Virtual visit. Wears corrective lenses. Followed by their ophthalmologist every 12 months.  Retinopathy- mild  Dental: Visits every 6 months.    Hearing: Hearing aids- yes, bilateral  Safety:  Patient feels safe at home- yes Patient does have smoke detectors at home- yes Patient does wear sunscreen or protective clothing when in direct sunlight - yes Patient does wear seat belt when in a moving vehicle - yes Patient drives- yes Adequate lighting in walkways free from debris- yes Grab bars and handrails used as appropriate- yes Ambulates with no assistive device  Social: Alcohol intake - no       Smoking history- never   Smokers in home? none Illicit drug use? none  Depression: PHQ 2 &9 complete. See screening below. Denies irritability, anhedonia, sadness/tearfullness.  Stable.   Falls: See screening below.    Medication: Taking as directed and without issues. Managed with weekly pill box.   Covid-19: Precautions and sickness symptoms discussed. Wears mask, social distancing, hand hygiene as appropriate.   Activities of Daily Living Patient denies needing assistance with: household chores, feeding themselves, getting from bed to chair, getting to the toilet, bathing/showering, dressing, managing money, or preparing meals.   Memory: Patient is alert. Patient denies difficulty focusing or concentrating. Correctly identified the president of the [place], season and recall. Patient likes to play piano for brain stimulation.  BMI- discussed the importance of a healthy diet, water intake and the benefits of aerobic exercise.  Educational  material provided.  Physical activity- walking 2 miles daily  Diet: regular; monitored closely Water: good intake Caffeine: 3 cups of coffee daily  Advanced Directive: End of life planning; Advance aging; Advanced directives discussed.  Copy of current HCPOA/Living Will requested.    Other Providers Patient Care Team: [name], MD as PCP - General (Family Medicine)  Exercise Activities and Dietary recommendations Current Exercise Habits: Home exercise routine, Type of exercise: walking, Time (Minutes): 60, Frequency (Times/Week): 6, Weekly Exercise (Minutes/Week): 360  Goals      Patient Stated   . Weight (lb) < 185 lb (83.9 kg) (pt-stated)     Stay active  Add swimming       Fall Risk Fall Risk  04/02/2019 03/21/2019 02/27/2019 08/01/2018 03/31/2018  Falls in the past year? 0 1 0 0 No  Comment - - - - -  Number falls in past yr: - 0 0 0 -  Injury with Fall? - 0 - 0 -  Comment - - - - -  Risk Factor Category  - - - - -  Risk for fall due to : - - - - -  Follow up - - - - -  Comment - - - - -   Timed Get Up and Go Performed: no, virtual visit  Depression Screen [facility] 2/9 Scores 04/02/2019 03/21/2019 02/27/2019 03/31/2018  PHQ - 2 Score 0 0 0 0  PHQ- 9 Score - - 0 -  Exception Documentation - - - -    Cognitive Function MMSE - Mini Mental State Exam 03/31/2018 03/30/2017  Orientation to time 5 5  Orientation to Place 5 5  Registration 3 3  Attention/ Calculation 5 5  Recall 3 3  Language- name 2 objects 2 2  Language- repeat 1 1  Language- follow 3 step command 3 3  Language- read & follow direction 1 1  Write a sentence 1 1  Copy design 1 1  Total score 30 30     6CIT Screen 04/02/2019  What Year? 0 points  What month? 0 points  What time? 0 points  Count back from 20 0 points  Months in reverse 0 points  Repeat phrase 0 points  Total Score 0    Immunization History  Administered Date(s) Administered  . Influenza, High Dose Seasonal PF  06/25/2016, 05/08/2018  . Influenza,inj,Quad PF,6+ Mos 07/10/2013, 04/05/2017  . Influenza-Unspecified 05/21/2012, 05/29/2014, 04/10/2015, 04/06/2017  . Pneumococcal Conjugate-13 06/13/2014  . Pneumococcal Polysaccharide-23 03/21/2010, 04/05/2017  . Tdap 06/03/2014  . Zoster 05/10/2013   Screening Tests Health Maintenance  Topic Date Due  . OPHTHALMOLOGY EXAM  03/08/2019  . INFLUENZA VACCINE  03/10/2019  . FOOT EXAM  05/09/2019  . HEMOGLOBIN A1C  09/14/2019  . TETANUS/TDAP  06/03/2024  . COLONOSCOPY  12/28/2027  . Hepatitis C Screening  Completed  . PNA vac Low Risk Adult  Completed       Plan:    Keep all routine maintenance appointments.   Follow up 6 week- 05/01/19  Follow up 4 month- 07/02/19  Medicare Attestation I have personally reviewed: The patient's medical and social history Their use of alcohol, tobacco or illicit drugs Their current medications and supplements The patient's functional ability including ADLs,fall risks, home safety risks, cognitive, and hearing and visual impairment Diet and physical activities Evidence for depression   In addition, I have reviewed and discussed with patient certain preventive protocols, quality metrics, and best practice recommendations. A written personalized care plan for preventive services as well as general preventive health recommendations were provided to patient.     Varney Biles, LPN  [id number]

## 2019-04-11 LAB — HM DIABETES EYE EXAM

## 2019-04-26 ENCOUNTER — Ambulatory Visit (INDEPENDENT_AMBULATORY_CARE_PROVIDER_SITE_OTHER): Payer: Medicare Other | Admitting: Family Medicine

## 2019-04-26 ENCOUNTER — Other Ambulatory Visit: Payer: Self-pay

## 2019-04-26 ENCOUNTER — Telehealth: Payer: Self-pay | Admitting: Family Medicine

## 2019-04-26 DIAGNOSIS — M810 Age-related osteoporosis without current pathological fracture: Secondary | ICD-10-CM | POA: Diagnosis not present

## 2019-04-26 DIAGNOSIS — I10 Essential (primary) hypertension: Secondary | ICD-10-CM | POA: Diagnosis not present

## 2019-04-26 DIAGNOSIS — E119 Type 2 diabetes mellitus without complications: Secondary | ICD-10-CM | POA: Diagnosis not present

## 2019-04-26 DIAGNOSIS — J019 Acute sinusitis, unspecified: Secondary | ICD-10-CM

## 2019-04-26 MED ORDER — AMOXICILLIN-POT CLAVULANATE 875-125 MG PO TABS: 1.0000 | ORAL_TABLET | Freq: Two times a day (BID) | ORAL | 0 refills | Status: DC

## 2019-04-26 NOTE — Telephone Encounter (Signed)
Phone is fine

## 2019-04-26 NOTE — Telephone Encounter (Signed)
Sinus infection symptoms will make him most likely not pass covid screening questions  Can this be virtual?

## 2019-04-26 NOTE — Progress Notes (Signed)
Patient ID: Justin Patel, male   DOB: May 14, 1951, 68 y.o.   MRN: [id number]    Virtual Visit via phone Note  This visit type was conducted due to national recommendations for restrictions regarding the COVID-19 pandemic (e.g. social distancing).  This format is felt to be most appropriate for this patient at this time.  All issues noted in this document were discussed and addressed.  No physical exam was performed (except for noted visual exam findings with Video Visits).   I connected with Justin Patel today at 11:00 AM EDT by telephone and verified that I am speaking with the correct person using two identifiers. Location patient: home Location provider: work or home office Persons participating in the virtual visit: patient, provider  I discussed the limitations, risks, security and privacy concerns of performing an evaluation and management service by telephone and the availability of in person appointments. I also discussed with the patient that there may be a patient responsible charge related to this service. The patient expressed understanding and agreed to proceed.  HPI:  Patient and I connected via telephone due to sinus congestion, pain and pressure that is been building up for the last 2 weeks.  Patient reports drainage down back of throat and yellow thick discharge when blows his nose.  Patient does have a long history of sinus problems and severe seasonal allergies.  Has also history of sinus surgery.  Is taking oral antihistamine.  Has not done saline nasal rinse recently.  Patient also would like to report blood pressure this morning 172/96.  Has not taken his BP meds yet.  We takes his blood pressure medications he usually has readings that run between 130-140/70-80.  Denies chest pain, shortness of breath, palpitations, lower extremity swelling.  Blood sugar this morning is 114.  Tolerating diabetes meds without any issues.  Patient states he did have ophthalmology exam  recently, will get those results forwarded here to this office so we have for our charting.  Was on Prolia previously for osteoporosis, now gets Reclast infusion.  Overall feels a fusion is going well.  Does still have chronic generalized joint pains that he associates with some arthritis and also his osteoporosis.  Overall he is been doing well throughout the pandemic.  Has great support from his family and has been enjoying seeing his grandchildren and being able to face time with his family that lives out of state.   ROS: See pertinent positives and negatives per HPI.  Past Medical History:  Diagnosis Date  . 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 Anxiety   . Arthritis   . 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  removal scheduled to clear margins.  . CHF (congestive heart failure) ([place])    no longer after weight loss  . Chronic kidney disease    cysts  . Degenerative disc disease   . Diabetes mellitus    no longer diabetic-or on meds  . Family history of adverse reaction to anesthesia    sons wake up combative  . Gastric ulcer   . GERD (gastroesophageal reflux disease)   . H/O hiatal hernia   . Headache(784.0)    sinus  . Heart murmur   . Hx of congestive heart failure   . Hyperlipidemia   . Hypertension   . Iron deficiency anemia 02/28/2015  . Opiate abuse, continuous ([place]) 06/20/2015  . Sacral fracture, closed ([place])   .  Seizures ([place])    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, after GI bleed  . Sleep apnea    hx not now since wt loss  . Stones in the urinary tract   . Syncope and collapse     Past Surgical History:  Procedure Laterality Date  . ANTERIOR CERVICAL DECOMP/DISCECTOMY FUSION  01/26/2012   Procedure: ANTERIOR CERVICAL DECOMPRESSION/DISCECTOMY FUSION 3 LEVELS;  Surgeon: Floyce Stakes, MD;  Location: [facility];  Service: Neurosurgery;  Laterality: N/A;  Cervical three-four Cervical four-five Cervical  five-six Cervical six-seven , Anterior cervical decompression/diskectomy, fusion, plate  . ANTERIOR LAT LUMBAR FUSION Right 04/25/2018   Procedure: Right Lumbar two-three Lumbar three-four Lumbar four-five anterior  lateral interbody fusion  with posterior percutaneous pedicle screws;  Surgeon: Erline Levine, MD;  Location: [facility];  Service: Neurosurgery;  Laterality: Right;  . APPENDECTOMY    . [place], normal  . CARDIAC CATHETERIZATION     [place]  . COLONOSCOPY WITH PROPOFOL N/A 12/27/2017   Procedure: COLONOSCOPY WITH PROPOFOL;  Surgeon: Toledo, Benay Pike, MD;  Location: [facility];  Service: Gastroenterology;  Laterality: N/A;  . ESOPHAGOGASTRODUODENOSCOPY (EGD) WITH PROPOFOL N/A 12/27/2017   Procedure: ESOPHAGOGASTRODUODENOSCOPY (EGD) WITH PROPOFOL;  Surgeon: Toledo, Benay Pike, MD;  Location: [facility];  Service: Gastroenterology;  Laterality: N/A;  . GASTRIC BYPASS  2010   [place]  . HERNIA REPAIR  2000   hiatal  . JOINT REPLACEMENT    . KNEE ARTHROSCOPY     bilateral, left x 2  . LUMBAR PERCUTANEOUS PEDICLE SCREW 3 LEVEL N/A 04/25/2018   Procedure: LUMBAR PERCUTANEOUS PEDICLE SCREW 3 LEVEL;  Surgeon: Erline Levine, MD;  Location: [place];  Service: Neurosurgery;  Laterality: N/A;  . NASAL SINUS SURGERY     x5  . OSTEOTOMY  2001   left  . TONSILLECTOMY    . TOTAL KNEE ARTHROPLASTY Left 05/2014   Dr. Marry Guan  . UVULOPALATOPLASTY  2011    Family History  Problem Relation Age of Onset  . Dementia Mother 51  . Osteoporosis Mother   . Heart disease Father 54  . Diabetes Father   . Lymphoma Sister        lymphoma, stage 4  . Ovarian cancer Sister 33       Ovarian  . Lupus Sister   . Prostate cancer Maternal Uncle   . Skin cancer Maternal Uncle   . Tongue cancer Maternal Uncle        uncle died of heart attack  . Alcoholism Maternal Uncle   . Lung cancer Paternal Aunt        pat aunts x 4 died of lung cancer  . Lung  cancer Paternal Aunt   . Lung cancer Paternal Aunt   . Lung cancer Paternal Aunt   . Mesothelioma Cousin        paternal cousin   Social History   Tobacco Use  . Smoking status: Never Smoker  . Smokeless tobacco: Never Used  Substance Use Topics  . Alcohol use: No     Current Outpatient Medications:  .  acetaminophen (TYLENOL) 500 MG tablet, Take 1,000 mg by mouth 3 (three) times daily as needed for moderate pain or headache. , Disp: , Rfl:  .  amLODipine (NORVASC) 5 MG tablet, Take 1 tablet (5 mg total) by mouth daily., Disp: 90 tablet, Rfl: 3 .  blood glucose meter kit and supplies KIT, Dispense based on patient and insurance preference.  Check once daily. ICD doing E11.9., Disp: 1 each, Rfl: 0 .  diphenhydrAMINE (BENADRYL) 25 MG tablet, Take 25 mg by mouth daily as needed for allergies., Disp: , Rfl:  .  DULoxetine (CYMBALTA) 60 MG capsule, TAKE 1 CAPSULE EVERY DAY, Disp: 90 capsule, Rfl: 1 .  esomeprazole (NEXIUM) 40 MG capsule, TAKE 1 CAPSULE BY MOUTH ONCE DAILY, Disp: 90 capsule, Rfl: 1 .  furosemide (LASIX) 20 MG tablet, TAKE 1 TABLET BY MOUTH DAILY, Disp: 90 tablet, Rfl: 1 .  losartan (COZAAR) 100 MG tablet, TAKE ONE TABLET EVERY DAY, Disp: 90 tablet, Rfl: 0 .  methocarbamol (ROBAXIN) 750 MG tablet, Take 1 tablet (750 mg total) by mouth every 6 (six) hours as needed for muscle spasms., Disp: 60 tablet, Rfl: 1 .  metoprolol succinate (TOPROL-XL) 50 MG 24 hr tablet, TAKE 1 TABLET BY MOUTH WITH OR IMMEDIATLY FOLLOWING A MEAL, Disp: 30 tablet, Rfl: 0 .  Multiple Vitamin (MULTIVITAMIN) capsule, Take 1 capsule by mouth daily. With copper and zinc, Disp: , Rfl:  .  nystatin cream (MYCOSTATIN), Apply 1 application topically 2 (two) times daily. (Patient taking differently: Apply 1 application topically 2 (two) times daily as needed (dermatitis). ), Disp: 30 g, Rfl: 0 .  nystatin-triamcinolone ointment (MYCOLOG), Apply 1 application topically 2 (two) times daily., Disp: 30 g, Rfl: 0 .   rosuvastatin (CRESTOR) 40 MG tablet, Take 1 tablet (40 mg total) by mouth daily., Disp: 90 tablet, Rfl: 1 .  sucralfate (CARAFATE) 1 g tablet, TAKE ONE TABLET BY MOUTH FOUR TIMES DAILY, Disp: 360 tablet, Rfl: 1 .  tamsulosin (FLOMAX) 0.4 MG CAPS capsule, Take 1 capsule (0.4 mg total) by mouth daily., Disp: 90 capsule, Rfl: 3 .  TRADJENTA 5 MG TABS tablet, TAKE ONE TABLET EVERY DAY, Disp: 90 tablet, Rfl: 1 .  traMADol (ULTRAM) 50 MG tablet, Take by mouth every 6 (six) hours as needed. Take every 6 hours ( 4 times daily) as needed for pain, Disp: , Rfl:  .  Vitamin D, Ergocalciferol, (DRISDOL) 1.25 MG (50000 UT) CAPS capsule, TAKE 1 CAPSULE EVERY 7 DAYS, Disp: 8 capsule, Rfl: 0  EXAM:  VITALS per patient if applicable:  BP 570/17 (has not taken his BP meds yet this AM) Blood sugar - 114 Weight 187  GENERAL: alert, oriented, sounds well and in no acute distress  LUNGS: Speaking in full sentences. No signs of respiratory distress, breathing rate appears normal, no obvious gross SOB, gasping or wheezing  PSYCH/NEURO: pleasant and cooperative, no obvious depression or anxiety, speech and thought processing grossly intact  ASSESSMENT AND PLAN:  Discussed the following assessment and plan:  1. Acute sinusitis, recurrence not specified, unspecified location Augmentin course. Do saline nasal rinses to wash out sinuses. Continue antihistamine.  - amoxicillin-clavulanate (AUGMENTIN) 875-125 MG tablet; Take 1 tablet by mouth 2 (two) times daily.  Dispense: 20 tablet; Refill: 0  2. Essential hypertension, benign Take BP meds as prescribed. BP readings stable and BP responds to medications.   3. Type 2 diabetes mellitus without complication, without long-term current use of insulin (HCC) Stable. Continue current meds.   4. Osteoporosis without current pathological fracture, unspecified osteoporosis type Continue ReClast     I discussed the assessment and treatment plan with the patient. The  patient was provided an opportunity to ask questions and all were answered. The patient agreed with the plan and demonstrated an understanding of the instructions.   The patient was advised to call back or seek an in-person evaluation  if the symptoms worsen or if the condition fails to improve as anticipated.  I provided 23 minutes of non-face-to-face time during this encounter.   Jodelle Green, FNP

## 2019-04-26 NOTE — Telephone Encounter (Signed)
Called Pt and he stated he has a hard time with his camera on his phone. Pt stated he could  do a regular telephone visit

## 2019-04-30 ENCOUNTER — Other Ambulatory Visit: Payer: Self-pay

## 2019-05-01 ENCOUNTER — Telehealth: Payer: Self-pay | Admitting: Family Medicine

## 2019-05-01 ENCOUNTER — Encounter: Payer: Self-pay | Admitting: Family Medicine

## 2019-05-01 ENCOUNTER — Ambulatory Visit (INDEPENDENT_AMBULATORY_CARE_PROVIDER_SITE_OTHER): Payer: Medicare Other | Admitting: Family Medicine

## 2019-05-01 ENCOUNTER — Other Ambulatory Visit: Payer: Self-pay

## 2019-05-01 VITALS — BP 140/90 | HR 82 | Temp 98.1°F | Ht 66.0 in | Wt 196.0 lb

## 2019-05-01 DIAGNOSIS — Z23 Encounter for immunization: Secondary | ICD-10-CM | POA: Diagnosis not present

## 2019-05-01 DIAGNOSIS — N5314 Retrograde ejaculation: Secondary | ICD-10-CM | POA: Diagnosis not present

## 2019-05-01 DIAGNOSIS — R61 Generalized hyperhidrosis: Secondary | ICD-10-CM

## 2019-05-01 DIAGNOSIS — R7612 Nonspecific reaction to cell mediated immunity measurement of gamma interferon antigen response without active tuberculosis: Secondary | ICD-10-CM

## 2019-05-01 DIAGNOSIS — E119 Type 2 diabetes mellitus without complications: Secondary | ICD-10-CM

## 2019-05-01 DIAGNOSIS — I10 Essential (primary) hypertension: Secondary | ICD-10-CM

## 2019-05-01 DIAGNOSIS — J0101 Acute recurrent maxillary sinusitis: Secondary | ICD-10-CM

## 2019-05-01 MED ORDER — AMLODIPINE BESYLATE 10 MG PO TABS: 10.0000 mg | ORAL_TABLET | Freq: Every day | ORAL | 1 refills | Status: DC

## 2019-05-01 NOTE — Telephone Encounter (Signed)
-----   Message from De Hollingshead, [facility] sent at 05/01/2019  1:15 PM EDT ----- Doristine Devoid question -   I was taught what you are thinking, if angioedema w/ ACEI, don't use ARB.   However, in studying for my boards exams, apparently that isn't a hard contraindication. I talked to a cardiology pharmacist this weekend about it, and she confirmed that it's OK to use ARB.... just counsel him that if he ever starts to feel throat swelling, immediately seek medical attention.   With ACEi, you're right, it can happen at any time, no matter how long the patient has been on it. Theoretically, it shouldn't happen w/ ARBs as it's due to elevated bradykinin levels, and ACEis and ARNI (not ARB) affect bradykinin.   Catie ----- Message ----- From: [name], MD Sent: 05/01/2019  12:46 PM EDT To: De Hollingshead, [place] Catie,   This patient reports a history of anaphylaxis/angioedema with ramipril. He has been on losartan for about 6 years with no issues. Given the history with the ramipril I advised that he stop the losartan given the potential for cross reactivity. I wanted to see what you thought about this that. Thanks.  Randall Hiss

## 2019-05-01 NOTE — Assessment & Plan Note (Signed)
I suspect this was related to low CBGs.  He will monitor for recurrence.

## 2019-05-01 NOTE — Progress Notes (Signed)
Justin Rumps, MD Phone: [phone]  SEVASTIAN ZAHRA is a 68 y.o. male who presents today for follow-up.  Hypertension: Typically 130/80 at home though has been higher at times.  He notes a little bit of hand swelling with amlodipine 5 mg daily.  He is taking Lasix, losartan, and metoprolol.  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  He has been on the losartan for about 6 years.  No chest pain, shortness of breath, or edema.  Diabetes: Taking Tradjenta.  Glucose has been running 90-110.  No additional lows at night.  Night sweats have almost resolved with improved sugars.  No polyuria or polydipsia.  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  He was advised that the antibiotic can be hard on the stomach and he was having those issues with it previously.  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  Sinus infection: This is improved significantly on Augmentin.  Retrograde ejaculation: Patient reports for the last couple of months he has had an issue where he will ejaculate and nothing comes out.  He urinates fine.  He is able to maintain an erection and orgasm appropriately.  No pain with erections or orgasm.  Social History   Tobacco Use  Smoking Status Never Smoker  Smokeless Tobacco Never Used     ROS see history of present illness  Objective  Physical Exam Vitals:   05/01/19 0937 05/01/19 1006  BP: (!) 150/90 140/90  Pulse: 82   Temp: 98.1 F (36.7 C)   SpO2: 97%     BP Readings from Last 3 Encounters:  05/01/19 140/90  03/21/19 140/90  02/15/19 120/80   Wt Readings from Last 3 Encounters:  05/01/19 196 lb (88.9 kg)  03/21/19 194 lb (88 kg)  02/27/19 184 lb (83.5 kg)    Physical Exam Constitutional:      General: He is not in acute distress.    Appearance: He is not diaphoretic.  Cardiovascular:     Rate and  Rhythm: Normal rate and regular rhythm.     Heart sounds: Normal heart sounds.  Pulmonary:     Effort: Pulmonary effort is normal.     Breath sounds: Normal breath sounds.  Genitourinary:    Penis: Normal.      Scrotum/Testes: Normal.     Comments: Normal scrotum, normal epididymis, normal vas deferens, no inguinal hernias Skin:    General: Skin is warm and dry.  Neurological:     Mental Status: He is alert.      Assessment/Plan: Please see individual problem list.  Essential hypertension, benign Mild improvement.  Given his issues with ACE inhibitor's previously we will have him discontinue losartan.  We will check with our clinical pharmacist regarding this as well.  He will increase his amlodipine to 10 mg daily.  He will continue Lasix and metoprolol.  I advised him to not wear his rings on his hands until we know whether or not his swelling in his hands will worsen with the increased dose of amlodipine.  If it worsens we will decrease the dose back to 5 mg.  He will follow-up in 2 weeks for BP check with nursing.  Diabetes ([place]) Well-controlled.He will continue his current regimen.  Prior sweating at night was likely related to low CBGs.  Chronic night sweats I suspect this was related to low  CBGs.  He will monitor for recurrence.  Positive QuantiFERON-TB Gold test Per patient report he spoke with the health department.  He has deferred treatment as it was reported to him as optional and he had side effects on it previously.  Ejaculation, retrograde Concern for retrograde ejaculation given his description.  We will refer him to urology.  Sinus infection Improving.  He will finish his course of Augmentin.   Orders Placed This Encounter  Procedures  . Flu Vaccine QUAD High Dose(Fluad)  . Ambulatory referral to Urology    Referral Priority:   Routine    Referral Type:   Consultation    Referral Reason:   Specialty Services Required    Requested Specialty:   Urology     Number of Visits Requested:   1    Meds ordered this encounter  Medications  . amLODipine (NORVASC) 10 MG tablet    Sig: Take 1 tablet (10 mg total) by mouth daily.    Dispense:  90 tablet    Refill:  1     Justin Rumps, MD [place]

## 2019-05-01 NOTE — Telephone Encounter (Signed)
Please let the patient know I heard back from our pharmacist regarding the losartan.  She noted that it should actually be okay for him to remain on this.  He needs to monitor for any throat swelling, tongue swelling, or any similar reaction to what he had with the ramipril and if that occurs he needs to seek medical attention immediately.  He should proceed with the increased dose of amlodipine as discussed as well.

## 2019-05-01 NOTE — Assessment & Plan Note (Signed)
Per patient report he spoke with the health department.  He has deferred treatment as it was reported to him as optional and he had side effects on it previously.

## 2019-05-01 NOTE — Patient Instructions (Addendum)
Nice to see you. Please discontinue the losartan.  We will increase her amlodipine.  We will have you return in 2 weeks for blood pressure check.  If your swelling increases please let us know.  Please do not wear your readings while we increase the dose of your amlodipine. We will get you to see urology.

## 2019-05-01 NOTE — Assessment & Plan Note (Signed)
Mild improvement.  Given his issues with ACE inhibitor's previously we will have him discontinue losartan.  We will check with our clinical pharmacist regarding this as well.  He will increase his amlodipine to 10 mg daily.  He will continue Lasix and metoprolol.  I advised him to not wear his rings on his hands until we know whether or not his swelling in his hands will worsen with the increased dose of amlodipine.  If it worsens we will decrease the dose back to 5 mg.  He will follow-up in 2 weeks for BP check with nursing.

## 2019-05-01 NOTE — Assessment & Plan Note (Signed)
Well-controlled.He will continue his current regimen.  Prior sweating at night was likely related to low CBGs.

## 2019-05-01 NOTE — Assessment & Plan Note (Signed)
Concern for retrograde ejaculation given his description.  We will refer him to urology.

## 2019-05-01 NOTE — Assessment & Plan Note (Signed)
Improving.  He will finish his course of Augmentin.

## 2019-05-02 NOTE — Telephone Encounter (Signed)
Pt was given the information and he understood

## 2019-05-02 NOTE — Telephone Encounter (Signed)
Lmtcb.  [place] for [employer] to advise. Per Dr. Caryl Bis,  Please let the patient know he heard back from our pharmacist regarding the losartan.  She noted that it should actually be okay for him to remain on this.  He needs to monitor for any throat swelling, tongue swelling, or any similar reaction to what he had with the ramipril and if that occurs he needs to seek medical attention immediately.  He should proceed with the increased dose of amlodipine as discussed as well. Marielle Mantione,cma

## 2019-05-15 ENCOUNTER — Telehealth: Payer: Self-pay

## 2019-05-15 ENCOUNTER — Ambulatory Visit: Payer: Medicare Other

## 2019-05-15 ENCOUNTER — Other Ambulatory Visit: Payer: Self-pay

## 2019-05-15 NOTE — Telephone Encounter (Signed)
Please call the patient and offer him a visit to discuss his symptoms. He will need to quarantine at home until he has met the criteria of 10 days from the onset of his symptoms and he has been at least 72 hours with out fever with out the use of ibuprofen or tylenol and he has had at least 3 days of improved symptoms. This means that he should not leave the house unless he has to seek medical attention in the ED and he should not have any one come to visit him. His family needs to quarantine as well and will need to be tested 5-7 days after the onset of his symptoms. If he has any worsening symptoms of shortness of breath he needs to be evaluated in the ED. Thanks.

## 2019-05-15 NOTE — Telephone Encounter (Signed)
Copied from [place] [id number]. Topic: General - Other >> May 15, 2019  4:40 PM Rainey Pines A wrote: Patient would like to speak with Dr. Caryl Bis nurse in regards to patient experiencing covid like symptoms.

## 2019-05-16 ENCOUNTER — Other Ambulatory Visit: Payer: Self-pay | Admitting: *Deleted

## 2019-05-16 DIAGNOSIS — Z20822 Contact with and (suspected) exposure to covid-19: Secondary | ICD-10-CM

## 2019-05-16 NOTE — Telephone Encounter (Signed)
I called and spoke with patient and informed him that until his results are given he is to be quarantined and so does his family. I also informed him that he has to stay quarantined until 10 days from the onset of his symptoms and at least 72 hours with no fever without the use of tylenol or ibuprofen.  I informed the patient to not leave the house unless he is seeking medical attention in the ED and if his symptoms worsens to go to the Er to be evaluated, especially if his SOB is worse. Patient understood and wanted to wait until his results come back (Friday) to schedule a telephone visit if needed.    Shandie Bertz,cma

## 2019-05-16 NOTE — Telephone Encounter (Signed)
Noted. Thanks for calling him.

## 2019-05-18 LAB — NOVEL CORONAVIRUS, NAA: SARS-CoV-2, NAA: NOT DETECTED

## 2019-05-22 ENCOUNTER — Encounter: Payer: Self-pay | Admitting: Family Medicine

## 2019-05-22 ENCOUNTER — Ambulatory Visit (INDEPENDENT_AMBULATORY_CARE_PROVIDER_SITE_OTHER): Payer: Medicare Other | Admitting: Family Medicine

## 2019-05-22 ENCOUNTER — Other Ambulatory Visit: Payer: Self-pay

## 2019-05-22 DIAGNOSIS — J0101 Acute recurrent maxillary sinusitis: Secondary | ICD-10-CM | POA: Diagnosis not present

## 2019-05-22 DIAGNOSIS — R200 Anesthesia of skin: Secondary | ICD-10-CM | POA: Insufficient documentation

## 2019-05-22 DIAGNOSIS — R29818 Other symptoms and signs involving the nervous system: Secondary | ICD-10-CM

## 2019-05-22 HISTORY — DX: Anesthesia of skin: R20.0

## 2019-05-22 MED ORDER — DOXYCYCLINE HYCLATE 100 MG PO TABS: 100.0000 mg | ORAL_TABLET | Freq: Two times a day (BID) | ORAL | 0 refills | Status: DC

## 2019-05-22 NOTE — Assessment & Plan Note (Signed)
Focal V2 and V3 face numbness on the left reported by patient.  This has been going on for a number of months and thus does not represent an acute change.  This is concerning for possible prior stroke.  Will obtain an MRI brain to evaluate further.  I advised that if he develops any acute neurological changes he needs to seek medical attention in the emergency department.

## 2019-05-22 NOTE — Progress Notes (Signed)
Virtual Visit via telephone Note  This visit type was conducted due to national recommendations for restrictions regarding the COVID-19 pandemic (e.g. social distancing).  This format is felt to be most appropriate for this patient at this time.  All issues noted in this document were discussed and addressed.  No physical exam was performed (except for noted visual exam findings with Video Visits).   I connected with Justin Patel today at  1:45 PM EDT by telephone and verified that I am speaking with the correct person using two identifiers. Location patient: home Location provider: work  Persons participating in the virtual visit: patient, provider  I discussed the limitations, risks, security and privacy concerns of performing an evaluation and management service by telephone and the availability of in person appointments. I also discussed with the patient that there may be a patient responsible charge related to this service. The patient expressed understanding and agreed to proceed.  Interactive audio and video telecommunications were attempted between this provider and patient, however failed, due to patient having technical difficulties OR patient did not have access to video capability.  We continued and completed visit with audio only.   Reason for visit: same day visit  HPI: Sinusitis: Patient notes sinus pressure recurred after prior treatment with Augmentin.  He was tested for COVID-19 and tested negative.  He notes sinus pressure and drainage with green mucus coming out of his nose.  He notes initially this responded to the Augmentin though several days after this recurred.  He was having some dyspnea previously that resolved.  No fevers.  Some mild cough.  Left face numbness: Patient notes this has been going on for a number of months now.  It occurs from his ear down to his chin.  He feels as though his smile is a little lopsided in the morning when he wakes up with this.  He  notes no other numbness or weakness.  He has not had any acute changes.   ROS: See pertinent positives and negatives per HPI.  Past Medical History:  Diagnosis Date  . 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 Anxiety   . Arthritis   . 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  removal scheduled to clear margins.  . CHF (congestive heart failure) ([place])    no longer after weight loss  . Chronic kidney disease    cysts  . Degenerative disc disease   . Diabetes mellitus    no longer diabetic-or on meds  . Family history of adverse reaction to anesthesia    sons wake up combative  . Gastric ulcer   . GERD (gastroesophageal reflux disease)   . H/O hiatal hernia   . Headache(784.0)    sinus  . Heart murmur   . Hx of congestive heart failure   . Hyperlipidemia   . Hypertension   . Iron deficiency anemia 02/28/2015  . Opiate abuse, continuous ([place]) 06/20/2015  . Sacral fracture, closed ([place])   . Seizures ([place])    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, after GI bleed  . Sleep apnea    hx not now since wt loss  . Stones in the urinary tract   . Syncope and collapse     Past Surgical History:  Procedure Laterality Date  . ANTERIOR CERVICAL DECOMP/DISCECTOMY FUSION  01/26/2012   Procedure: ANTERIOR CERVICAL DECOMPRESSION/DISCECTOMY FUSION 3 LEVELS;  Surgeon: Floyce Stakes, MD;  Location: [facility]  NEURO ORS;  Service: Neurosurgery;  Laterality: N/A;  Cervical three-four Cervical four-five Cervical five-six Cervical six-seven , Anterior cervical decompression/diskectomy, fusion, plate  . ANTERIOR LAT LUMBAR FUSION Right 04/25/2018   Procedure: Right Lumbar two-three Lumbar three-four Lumbar four-five anterior  lateral interbody fusion  with posterior percutaneous pedicle screws;  Surgeon: Erline Levine, MD;  Location: [place];  Service: Neurosurgery;  Laterality: Right;  . APPENDECTOMY    . [place], normal  .  CARDIAC CATHETERIZATION     [place]  . COLONOSCOPY WITH PROPOFOL N/A 12/27/2017   Procedure: COLONOSCOPY WITH PROPOFOL;  Surgeon: Toledo, Benay Pike, MD;  Location: [facility] ENDOSCOPY;  Service: Gastroenterology;  Laterality: N/A;  . ESOPHAGOGASTRODUODENOSCOPY (EGD) WITH PROPOFOL N/A 12/27/2017   Procedure: ESOPHAGOGASTRODUODENOSCOPY (EGD) WITH PROPOFOL;  Surgeon: Toledo, Benay Pike, MD;  Location: [facility] ENDOSCOPY;  Service: Gastroenterology;  Laterality: N/A;  . GASTRIC BYPASS  2010   [place]  . HERNIA REPAIR  2000   hiatal  . JOINT REPLACEMENT    . KNEE ARTHROSCOPY     bilateral, left x 2  . LUMBAR PERCUTANEOUS PEDICLE SCREW 3 LEVEL N/A 04/25/2018   Procedure: LUMBAR PERCUTANEOUS PEDICLE SCREW 3 LEVEL;  Surgeon: Erline Levine, MD;  Location: [place];  Service: Neurosurgery;  Laterality: N/A;  . NASAL SINUS SURGERY     x5  . OSTEOTOMY  2001   left  . TONSILLECTOMY    . TOTAL KNEE ARTHROPLASTY Left 05/2014   Dr. Marry Guan  . UVULOPALATOPLASTY  2011    Family History  Problem Relation Age of Onset  . Dementia Mother 89  . Osteoporosis Mother   . Heart disease Father 63  . Diabetes Father   . Lymphoma Sister        lymphoma, stage 4  . Ovarian cancer Sister 3       Ovarian  . Lupus Sister   . Prostate cancer Maternal Uncle   . Skin cancer Maternal Uncle   . Tongue cancer Maternal Uncle        uncle died of heart attack  . Alcoholism Maternal Uncle   . Lung cancer Paternal Aunt        pat aunts x 4 died of lung cancer  . Lung cancer Paternal Aunt   . Lung cancer Paternal Aunt   . Lung cancer Paternal Aunt   . Mesothelioma Cousin        paternal cousin    SOCIAL HX: Non-smoker.   Current Outpatient Medications:  .  acetaminophen (TYLENOL) 500 MG tablet, Take 1,000 mg by mouth 3 (three) times daily as needed for moderate pain or headache. , Disp: , Rfl:  .  amLODipine (NORVASC) 10 MG tablet, Take 1 tablet (10 mg total) by mouth daily., Disp: 90 tablet, Rfl: 1 .   blood glucose meter kit and supplies KIT, Dispense based on patient and insurance preference. Check once daily. ICD doing E11.9., Disp: 1 each, Rfl: 0 .  diphenhydrAMINE (BENADRYL) 25 MG tablet, Take 25 mg by mouth daily as needed for allergies., Disp: , Rfl:  .  DULoxetine (CYMBALTA) 60 MG capsule, TAKE 1 CAPSULE EVERY DAY, Disp: 90 capsule, Rfl: 1 .  esomeprazole (NEXIUM) 40 MG capsule, TAKE 1 CAPSULE BY MOUTH ONCE DAILY, Disp: 90 capsule, Rfl: 1 .  furosemide (LASIX) 20 MG tablet, TAKE 1 TABLET BY MOUTH DAILY, Disp: 90 tablet, Rfl: 1 .  methocarbamol (ROBAXIN) 750 MG tablet, Take 1 tablet (750 mg total) by mouth  every 6 (six) hours as needed for muscle spasms., Disp: 60 tablet, Rfl: 1 .  metoprolol succinate (TOPROL-XL) 50 MG 24 hr tablet, TAKE 1 TABLET BY MOUTH WITH OR IMMEDIATLY FOLLOWING A MEAL, Disp: 30 tablet, Rfl: 0 .  Multiple Vitamin (MULTIVITAMIN) capsule, Take 1 capsule by mouth daily. With copper and zinc, Disp: , Rfl:  .  nystatin cream (MYCOSTATIN), Apply 1 application topically 2 (two) times daily. (Patient taking differently: Apply 1 application topically 2 (two) times daily as needed (dermatitis). ), Disp: 30 g, Rfl: 0 .  nystatin-triamcinolone ointment (MYCOLOG), Apply 1 application topically 2 (two) times daily., Disp: 30 g, Rfl: 0 .  rosuvastatin (CRESTOR) 40 MG tablet, Take 1 tablet (40 mg total) by mouth daily., Disp: 90 tablet, Rfl: 1 .  sucralfate (CARAFATE) 1 g tablet, TAKE ONE TABLET BY MOUTH FOUR TIMES DAILY, Disp: 360 tablet, Rfl: 1 .  tamsulosin (FLOMAX) 0.4 MG CAPS capsule, Take 1 capsule (0.4 mg total) by mouth daily., Disp: 90 capsule, Rfl: 3 .  TRADJENTA 5 MG TABS tablet, TAKE ONE TABLET EVERY DAY, Disp: 90 tablet, Rfl: 1 .  traMADol (ULTRAM) 50 MG tablet, Take by mouth every 6 (six) hours as needed. Take every 6 hours ( 4 times daily) as needed for pain, Disp: , Rfl:  .  Vitamin D, Ergocalciferol, (DRISDOL) 1.25 MG (50000 UT) CAPS capsule, TAKE 1 CAPSULE EVERY 7  DAYS, Disp: 8 capsule, Rfl: 0 .  doxycycline (VIBRA-TABS) 100 MG tablet, Take 1 tablet (100 mg total) by mouth 2 (two) times daily., Disp: 14 tablet, Rfl: 0  EXAM:  VITALS per patient if applicable:  GENERAL: alert, oriented, appears well and in no acute distress  HEENT: atraumatic, conjunttiva clear, no obvious abnormalities on inspection of external nose and ears  NECK: normal movements of the head and neck  LUNGS: on inspection no signs of respiratory distress, breathing rate appears normal, no obvious gross SOB, gasping or wheezing  CV: no obvious cyanosis  MS: moves all visible extremities without noticeable abnormality  PSYCH/NEURO: pleasant and cooperative, no obvious depression or anxiety, speech and thought processing grossly intact  ASSESSMENT AND PLAN:  Discussed the following assessment and plan:  Numbness of face Focal V2 and V3 face numbness on the left reported by patient.  This has been going on for a number of months and thus does not represent an acute change.  This is concerning for possible prior stroke.  Will obtain an MRI brain to evaluate further.  I advised that if he develops any acute neurological changes he needs to seek medical attention in the emergency department.  Sinus infection Symptoms are consistent with a sinus infection.  He tested negative for COVID-19.  Given he had recurrent symptoms after Augmentin we will trial doxycycline.  If not beneficial he will let us know.    I discussed the assessment and treatment plan with the patient. The patient was provided an opportunity to ask questions and all were answered. The patient agreed with the plan and demonstrated an understanding of the instructions.   The patient was advised to call back or seek an in-person evaluation if the symptoms worsen or if the condition fails to improve as anticipated.  I provided 13 minutes of non-face-to-face time during this encounter.   [name], MD

## 2019-05-22 NOTE — Assessment & Plan Note (Signed)
Symptoms are consistent with a sinus infection.  He tested negative for COVID-19.  Given he had recurrent symptoms after Augmentin we will trial doxycycline.  If not beneficial he will let us know.

## 2019-05-27 ENCOUNTER — Ambulatory Visit
Admission: RE | Admit: 2019-05-27 | Discharge: 2019-05-27 | Disposition: A | Discharge status: Home / Self Care | Payer: Medicare Other | Source: Ambulatory Visit | Attending: Family Medicine | Admitting: Family Medicine

## 2019-05-27 ENCOUNTER — Other Ambulatory Visit: Payer: Self-pay

## 2019-05-27 DIAGNOSIS — R29818 Other symptoms and signs involving the nervous system: Secondary | ICD-10-CM | POA: Diagnosis present

## 2019-05-28 ENCOUNTER — Ambulatory Visit: Payer: Medicare Other | Admitting: Urology

## 2019-05-28 ENCOUNTER — Encounter: Payer: Self-pay | Admitting: Urology

## 2019-05-28 DIAGNOSIS — N5319 Other ejaculatory dysfunction: Secondary | ICD-10-CM

## 2019-05-28 DIAGNOSIS — Z125 Encounter for screening for malignant neoplasm of prostate: Secondary | ICD-10-CM

## 2019-05-28 NOTE — Progress Notes (Signed)
05/28/2019 9:54 AM   Justin Patel 1950/11/29 [id number]  Referring provider: [name], MD [address] [place],   [address]  Chief Complaint  Patient presents with  . Retrograde Ejaculate    HPI: Justin Patel is a 68 y.o. male seen at the request of Dr. Caryl Bis for evaluation of ejaculatory dysfunction.  Approximately 6 months ago he noted loss of seminal admission.  He achieves good erections and has orgasm.  His wife is in poor health and is status post vulvectomy.  He is not sexually active but does masturbate.  I saw him for BPH at [facility] previously and last saw him in 2016.  He was on tamsulosin and duloxetine at the time and had no issues with loss of ejaculation.  He has no voiding complaints.  Last PSA March 2019 was 2.05.  He does have diabetes.   PMH: Past Medical History:  Diagnosis Date  . 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 Anxiety   . Arthritis   . 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  removal scheduled to clear margins.  . CHF (congestive heart failure) ([place])    no longer after weight loss  . Chronic kidney disease    cysts  . Degenerative disc disease   . Diabetes mellitus    no longer diabetic-or on meds  . Family history of adverse reaction to anesthesia    sons wake up combative  . Gastric ulcer   . GERD (gastroesophageal reflux disease)   . H/O hiatal hernia   . Headache(784.0)    sinus  . Heart murmur   . Hx of congestive heart failure   . Hyperlipidemia   . Hypertension   . Iron deficiency anemia 02/28/2015  . Opiate abuse, continuous ([place]) 06/20/2015  . Sacral fracture, closed ([place])   . Seizures ([place])    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, after GI bleed  . Sleep apnea    hx not now since wt loss  . Stones in the urinary tract   . Syncope and collapse     Surgical History: Past Surgical History:  Procedure Laterality Date  . ANTERIOR CERVICAL  DECOMP/DISCECTOMY FUSION  01/26/2012   Procedure: ANTERIOR CERVICAL DECOMPRESSION/DISCECTOMY FUSION 3 LEVELS;  Surgeon: Floyce Stakes, MD;  Location: [facility] NEURO ORS;  Service: Neurosurgery;  Laterality: N/A;  Cervical three-four Cervical four-five Cervical five-six Cervical six-seven , Anterior cervical decompression/diskectomy, fusion, plate  . ANTERIOR LAT LUMBAR FUSION Right 04/25/2018   Procedure: Right Lumbar two-three Lumbar three-four Lumbar four-five anterior  lateral interbody fusion  with posterior percutaneous pedicle screws;  Surgeon: Erline Levine, MD;  Location: [place];  Service: Neurosurgery;  Laterality: Right;  . APPENDECTOMY    . [place], normal  . CARDIAC CATHETERIZATION     [place]  . COLONOSCOPY WITH PROPOFOL N/A 12/27/2017   Procedure: COLONOSCOPY WITH PROPOFOL;  Surgeon: Toledo, Benay Pike, MD;  Location: [facility] ENDOSCOPY;  Service: Gastroenterology;  Laterality: N/A;  . ESOPHAGOGASTRODUODENOSCOPY (EGD) WITH PROPOFOL N/A 12/27/2017   Procedure: ESOPHAGOGASTRODUODENOSCOPY (EGD) WITH PROPOFOL;  Surgeon: Toledo, Benay Pike, MD;  Location: [facility] ENDOSCOPY;  Service: Gastroenterology;  Laterality: N/A;  . GASTRIC BYPASS  2010   [place]  . HERNIA REPAIR  2000   hiatal  . JOINT REPLACEMENT    . KNEE ARTHROSCOPY     bilateral, left x 2  . LUMBAR  PERCUTANEOUS PEDICLE SCREW 3 LEVEL N/A 04/25/2018   Procedure: LUMBAR PERCUTANEOUS PEDICLE SCREW 3 LEVEL;  Surgeon: Erline Levine, MD;  Location: [place];  Service: Neurosurgery;  Laterality: N/A;  . NASAL SINUS SURGERY     x5  . OSTEOTOMY  2001   left  . TONSILLECTOMY    . TOTAL KNEE ARTHROPLASTY Left 05/2014   Dr. Marry Guan  . UVULOPALATOPLASTY  2011    Home Medications:  Allergies as of 05/28/2019      Reactions   Altace [ramipril] Anaphylaxis   Levaquin [levofloxacin In D5w] Hives   Metformin Diarrhea   High doses cause diarrhea.   Levofloxacin    Trulicity [dulaglutide] Nausea And  Vomiting   Conray [iothalamate] Hives   HIVES, he had this reaction to Ionic contrast in 1974 for an IVP. SPM   Lyrica [pregabalin] Other (See Comments)   Edema      Medication List       Accurate as of May 28, 2019  9:54 AM. If you have any questions, ask your nurse or doctor.        STOP taking these medications   methocarbamol 750 MG tablet Commonly known as: ROBAXIN Stopped by: Abbie Sons, MD     TAKE these medications   acetaminophen 500 MG tablet Commonly known as: TYLENOL Take 1,000 mg by mouth 3 (three) times daily as needed for moderate pain or headache.   amLODipine 10 MG tablet Commonly known as: NORVASC Take 1 tablet (10 mg total) by mouth daily.   blood glucose meter kit and supplies Kit Dispense based on patient and insurance preference. Check once daily. ICD doing E11.9.   diphenhydrAMINE 25 MG tablet Commonly known as: BENADRYL Take 25 mg by mouth daily as needed for allergies.   doxycycline 100 MG tablet Commonly known as: VIBRA-TABS Take 1 tablet (100 mg total) by mouth 2 (two) times daily.   DULoxetine 60 MG capsule Commonly known as: CYMBALTA TAKE 1 CAPSULE EVERY DAY   esomeprazole 40 MG capsule Commonly known as: NEXIUM TAKE 1 CAPSULE BY MOUTH ONCE DAILY   furosemide 20 MG tablet Commonly known as: LASIX TAKE 1 TABLET BY MOUTH DAILY   metoprolol succinate 50 MG 24 hr tablet Commonly known as: TOPROL-XL TAKE 1 TABLET BY MOUTH WITH OR IMMEDIATLY FOLLOWING A MEAL   multivitamin capsule Take 1 capsule by mouth daily. With copper and zinc   nystatin cream Commonly known as: MYCOSTATIN Apply 1 application topically 2 (two) times daily. What changed:   when to take this  reasons to take this   nystatin-triamcinolone ointment Commonly known as: MYCOLOG Apply 1 application topically 2 (two) times daily.   rosuvastatin 40 MG tablet Commonly known as: CRESTOR Take 1 tablet (40 mg total) by mouth daily.   sucralfate 1 g  tablet Commonly known as: CARAFATE TAKE ONE TABLET BY MOUTH FOUR TIMES DAILY   tamsulosin 0.4 MG Caps capsule Commonly known as: FLOMAX Take 1 capsule (0.4 mg total) by mouth daily.   Tradjenta 5 MG Tabs tablet Generic drug: linagliptin TAKE ONE TABLET EVERY DAY   traMADol 50 MG tablet Commonly known as: ULTRAM Take by mouth every 6 (six) hours as needed. Take every 6 hours ( 4 times daily) as needed for pain   Vitamin D (Ergocalciferol) 1.25 MG (50000 UT) Caps capsule Commonly known as: DRISDOL TAKE 1 CAPSULE EVERY 7 DAYS       Allergies:  Allergies  Allergen Reactions  . Altace [Ramipril] Anaphylaxis  .  Levaquin [Levofloxacin In D5w] Hives  . Metformin Diarrhea    High doses cause diarrhea.   . Levofloxacin   . Trulicity [Dulaglutide] Nausea And Vomiting  . 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 Lyrica [Pregabalin] Other (See Comments)    Edema    Family History: Family History  Problem Relation Age of Onset  . Dementia Mother 59  . Osteoporosis Mother   . Heart disease Father 77  . Diabetes Father   . Lymphoma Sister        lymphoma, stage 4  . Ovarian cancer Sister 14       Ovarian  . Lupus Sister   . Prostate cancer Maternal Uncle   . Skin cancer Maternal Uncle   . Tongue cancer Maternal Uncle        uncle died of heart attack  . Alcoholism Maternal Uncle   . Lung cancer Paternal Aunt        pat aunts x 4 died of lung cancer  . Lung cancer Paternal Aunt   . Lung cancer Paternal Aunt   . Lung cancer Paternal Aunt   . Mesothelioma Cousin        paternal cousin    Social History:  reports that he has never smoked. He has never used smokeless tobacco. He reports that he does not drink alcohol or use drugs.  ROS: UROLOGY Frequent Urination?: Yes Hard to postpone urination?: Yes Burning/pain with urination?: No Get up at night to urinate?: Yes Leakage of urine?: No Urine stream  starts and stops?: No Trouble starting stream?: No Do you have to strain to urinate?: No Blood in urine?: No Urinary tract infection?: No Sexually transmitted disease?: No Injury to kidneys or bladder?: No Painful intercourse?: No Weak stream?: No Erection problems?: No Penile pain?: No  Gastrointestinal Nausea?: No Vomiting?: No Indigestion/heartburn?: No Diarrhea?: No Constipation?: No  Constitutional Fever: No Night sweats?: No Weight loss?: No Fatigue?: No  Skin Skin rash/lesions?: Yes Itching?: Yes  Eyes Blurred vision?: No Double vision?: No  Ears/Nose/Throat Sore throat?: No Sinus problems?: Yes  Hematologic/Lymphatic Swollen glands?: No Easy bruising?: Yes  Cardiovascular Leg swelling?: No Chest pain?: No  Respiratory Cough?: No Shortness of breath?: No  Endocrine Excessive thirst?: No  Musculoskeletal Back pain?: Yes Joint pain?: Yes  Neurological Headaches?: No Dizziness?: No  Psychologic Depression?: No Anxiety?: No  Physical Exam: BP 122/72 (BP Location: Left Arm, Patient Position: Sitting, Cuff Size: Normal)   Pulse 97   Ht _0  (1.676 m)   Wt 195 lb 4.8 oz (88.6 kg)   BMI 31.52 kg/m   Constitutional:  Alert and oriented, No acute distress. HEENT: [address] AT, moist mucus membranes.  Trachea midline, no masses. Cardiovascular: No clubbing, cyanosis, or edema. Respiratory: Normal respiratory effort, no increased work of breathing. GU: Prostate 35 g, smooth without nodules Skin: No rashes, bruises or suspicious lesions. Neurologic: Grossly intact, no focal deficits, moving all 4 extremities. Psychiatric: Normal mood and affect.   Assessment & Plan:    - Ejaculatory dysfunction Loss of seminal admission.  He has been on tamsulosin and duloxetine for several years and unlikely related to these medications.  He has not sexually active and is not interested in fertility.  Would not recommend any further evaluation.  - BPH with  lower urinary tract symptoms Stable on tamsulosin.  - Prostate cancer screening We discussed the pros and cons  of prostate cancer screening and current recommendations of the option of screening between ages of 67-69.  He did desire to have PSA and will be notified with the results.  Abbie Sons, [place] [address], [place], [place] [address] [phone]

## 2019-05-29 ENCOUNTER — Encounter: Payer: Self-pay | Admitting: *Deleted

## 2019-05-29 LAB — PSA: Prostate Specific Ag, Serum: 1.4 ng/mL (ref 0.0–4.0)

## 2019-05-30 ENCOUNTER — Telehealth: Payer: Self-pay

## 2019-05-30 NOTE — Telephone Encounter (Signed)
-----   Message from Abbie Sons, MD sent at 05/30/2019  7:20 AM EDT ----- PSA stable at 1.4

## 2019-05-30 NOTE — Telephone Encounter (Signed)
Pt informed via mychart

## 2019-06-09 ENCOUNTER — Other Ambulatory Visit: Payer: Self-pay | Admitting: Family Medicine

## 2019-06-12 ENCOUNTER — Other Ambulatory Visit: Payer: Self-pay | Admitting: Family Medicine

## 2019-06-12 DIAGNOSIS — R600 Localized edema: Secondary | ICD-10-CM

## 2019-06-15 ENCOUNTER — Ambulatory Visit: Payer: Self-pay | Admitting: *Deleted

## 2019-06-15 DIAGNOSIS — R200 Anesthesia of skin: Secondary | ICD-10-CM

## 2019-06-15 NOTE — Telephone Encounter (Signed)
Pt called stating on 06/12/2019 his BP was 72/46 on his home cuff on his right wrist; at that time the pt says he is dizzy/lightheaded, and he was at the grocery store; he admits he had taken his amlodipine and metoprolol, and had not eaten; he says his cbg that morning was in the 90s; today his BP was 120/70 taken on left upper arm cuff; he has no additional BP readings, and only had that one episode; the pt says that he has been seeing stars and his  MRI brain showed mastoid bone around ear has effusions R>L; he has left ear pain; he also has numbness on the right face; per message sent to pt on 05/29/2019 it was requested that he confirm if he would like a referral to neurology; he says that he is not sure if he responded; the states he is willing to see Dr Tomi Likens, neurology; offered to schedule pt an appointment with Dr Caryl Bis, but he declined and states that he will wait for callback from neurology; the pt can be contacted at [phone]; he says that his voicemail is not set up;  will route to office for notification.     [phone]

## 2019-06-15 NOTE — Telephone Encounter (Signed)
I called and spoke with the patient and informed him to monitor his BP and if it drops he needs to let us know when it occurs, I informed him that the provider did put in a referral for a neurologist and they will call to schedule.  I informed him that if he is still seeing stars he needs to see an eye doctor if it gets worse he needs to go to the ED. Patient understood theses instructions.  Nina,cma

## 2019-06-15 NOTE — Addendum Note (Signed)
Addended by: [name] on: 06/15/2019 02:42 PM   Modules accepted: Orders

## 2019-06-15 NOTE — Telephone Encounter (Signed)
Pt called stating on 06/12/2019 his BP was 72/46 on his home cuff on his right wrist; at that time the pt says he is dizzy/lightheaded, and he was at the grocery store; he admits he had taken his amlodipine and metoprolol, and had not eaten; he says his cbg that morning was in the 90s; today his BP was 120/70 taken on left upper arm cuff; he has no additional BP readings, and only had that one episode; the pt says that he has been seeing stars and his  MRI brain showed mastoid bone around ear has effusions R>L; he has left ear pain; he also has numbness on the right face; per message sent to pt on 05/29/2019 it was requested that he confirm if he would like a referral to neurology; he says that he is not sure if he responded; the states he is willing to see Dr Tomi Likens, neurology; offered to schedule pt an appointment with Dr Caryl Bis, but he declined and states that he will wait for callback from neurology; the pt can be contacted at [phone]; he says that his voicemail is not set up;  will route to office for notification.     [phone]

## 2019-06-15 NOTE — Telephone Encounter (Signed)
Noted. He needs to continue to monitor his BP. If it drops again he needs to contact us when this occurs. He should be seen in person for the ear pain. If he continues to see stars he needs to see an eye doctor and he should contact his to be evaluated. If he notices loss of vision or worsening of the star seeing sensation he needs to go to the ED. I have placed a referral to the neurologist.

## 2019-06-23 ENCOUNTER — Other Ambulatory Visit: Payer: Self-pay | Admitting: Family Medicine

## 2019-06-23 DIAGNOSIS — I10 Essential (primary) hypertension: Secondary | ICD-10-CM

## 2019-07-02 ENCOUNTER — Ambulatory Visit: Payer: Medicare Other | Admitting: Family Medicine

## 2019-07-13 ENCOUNTER — Other Ambulatory Visit: Payer: Self-pay | Admitting: Neurosurgery

## 2019-07-13 ENCOUNTER — Other Ambulatory Visit (HOSPITAL_COMMUNITY): Payer: Self-pay | Admitting: Neurosurgery

## 2019-07-13 DIAGNOSIS — M5412 Radiculopathy, cervical region: Secondary | ICD-10-CM

## 2019-07-13 DIAGNOSIS — M4316 Spondylolisthesis, lumbar region: Secondary | ICD-10-CM

## 2019-07-26 ENCOUNTER — Ambulatory Visit
Admission: RE | Admit: 2019-07-26 | Discharge: 2019-07-26 | Disposition: A | Discharge status: Home / Self Care | Payer: Medicare Other | Source: Ambulatory Visit | Attending: Neurosurgery | Admitting: Neurosurgery

## 2019-07-26 ENCOUNTER — Other Ambulatory Visit: Payer: Self-pay

## 2019-07-26 DIAGNOSIS — M4316 Spondylolisthesis, lumbar region: Secondary | ICD-10-CM | POA: Diagnosis present

## 2019-07-26 DIAGNOSIS — M5412 Radiculopathy, cervical region: Secondary | ICD-10-CM

## 2019-07-26 LAB — POCT I-STAT CREATININE: Creatinine, Ser: 1.5 mg/dL — ABNORMAL HIGH (ref 0.61–1.24)

## 2019-07-26 MED ORDER — GADOBUTROL 1 MMOL/ML IV SOLN
10.0000 mL | Freq: Once | INTRAVENOUS | Status: AC | PRN
  Administered 2019-07-26: 9 mL via INTRAVENOUS
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
## 2019-07-31 ENCOUNTER — Other Ambulatory Visit: Payer: Self-pay

## 2019-07-31 ENCOUNTER — Encounter: Payer: Self-pay | Admitting: Neurology

## 2019-07-31 ENCOUNTER — Ambulatory Visit: Payer: Medicare Other | Admitting: Neurology

## 2019-07-31 VITALS — BP 128/80 | HR 94 | Ht 66.5 in | Wt 198.0 lb

## 2019-07-31 DIAGNOSIS — R200 Anesthesia of skin: Secondary | ICD-10-CM | POA: Diagnosis not present

## 2019-07-31 DIAGNOSIS — G250 Essential tremor: Secondary | ICD-10-CM | POA: Diagnosis not present

## 2019-07-31 MED ORDER — PRIMIDONE 50 MG PO TABS: ORAL_TABLET | ORAL | 0 refills | Status: DC

## 2019-07-31 NOTE — Patient Instructions (Signed)
I think the facial numbness may be either related to your sinuses or possibly TMJ dysfunction.  Consider having Dr. Caryl Bis send you to a periodontist.  For tremor, we will start primidone 50mg  tablet.  Take 1/2 tablet at bedtime for a week, then increase to 1 tablet at bedtime.   Follow up in 4 months.

## 2019-08-14 ENCOUNTER — Other Ambulatory Visit
Admission: RE | Admit: 2019-08-14 | Discharge: 2019-08-14 | Disposition: A | Discharge status: Home / Self Care | Payer: Medicare PPO | Source: Ambulatory Visit | Attending: Family Medicine | Admitting: Family Medicine

## 2019-08-14 ENCOUNTER — Ambulatory Visit (INDEPENDENT_AMBULATORY_CARE_PROVIDER_SITE_OTHER): Payer: Medicare PPO | Admitting: Family Medicine

## 2019-08-14 ENCOUNTER — Ambulatory Visit
Admission: RE | Admit: 2019-08-14 | Discharge: 2019-08-14 | Disposition: A | Discharge status: Home / Self Care | Payer: Medicare PPO | Source: Ambulatory Visit | Attending: Family Medicine | Admitting: Family Medicine

## 2019-08-14 ENCOUNTER — Encounter: Payer: Self-pay | Admitting: Family Medicine

## 2019-08-14 ENCOUNTER — Other Ambulatory Visit: Payer: Self-pay

## 2019-08-14 VITALS — Ht 66.0 in | Wt 192.0 lb

## 2019-08-14 DIAGNOSIS — I10 Essential (primary) hypertension: Secondary | ICD-10-CM | POA: Diagnosis not present

## 2019-08-14 DIAGNOSIS — M5136 Other intervertebral disc degeneration, lumbar region: Secondary | ICD-10-CM

## 2019-08-14 DIAGNOSIS — L299 Pruritus, unspecified: Secondary | ICD-10-CM

## 2019-08-14 DIAGNOSIS — E119 Type 2 diabetes mellitus without complications: Secondary | ICD-10-CM | POA: Diagnosis not present

## 2019-08-14 DIAGNOSIS — M7989 Other specified soft tissue disorders: Secondary | ICD-10-CM | POA: Diagnosis not present

## 2019-08-14 DIAGNOSIS — M79644 Pain in right finger(s): Secondary | ICD-10-CM

## 2019-08-14 DIAGNOSIS — M2550 Pain in unspecified joint: Secondary | ICD-10-CM

## 2019-08-14 DIAGNOSIS — R220 Localized swelling, mass and lump, head: Secondary | ICD-10-CM

## 2019-08-14 DIAGNOSIS — S6991XA Unspecified injury of right wrist, hand and finger(s), initial encounter: Secondary | ICD-10-CM | POA: Diagnosis not present

## 2019-08-14 LAB — CBC WITH DIFFERENTIAL/PLATELET
Abs Immature Granulocytes: 0.01 10*3/uL (ref 0.00–0.07)
Basophils Absolute: 0 10*3/uL (ref 0.0–0.1)
Basophils Relative: 1 %
Eosinophils Absolute: 0.2 10*3/uL (ref 0.0–0.5)
Eosinophils Relative: 3 %
HCT: 40.6 % (ref 39.0–52.0)
Hemoglobin: 13.1 g/dL (ref 13.0–17.0)
Immature Granulocytes: 0 %
Lymphocytes Relative: 26 %
Lymphs Abs: 1.8 10*3/uL (ref 0.7–4.0)
MCH: 32.1 pg (ref 26.0–34.0)
MCHC: 32.3 g/dL (ref 30.0–36.0)
MCV: 99.5 fL (ref 80.0–100.0)
Monocytes Absolute: 0.6 10*3/uL (ref 0.1–1.0)
Monocytes Relative: 9 %
Neutro Abs: 4.4 10*3/uL (ref 1.7–7.7)
Neutrophils Relative %: 61 %
Platelets: 205 10*3/uL (ref 150–400)
RBC: 4.08 MIL/uL — ABNORMAL LOW (ref 4.22–5.81)
RDW: 13.4 % (ref 11.5–15.5)
WBC: 7.2 10*3/uL (ref 4.0–10.5)
nRBC: 0 % (ref 0.0–0.2)

## 2019-08-14 LAB — SEDIMENTATION RATE: Sed Rate: 28 mm/hr — ABNORMAL HIGH (ref 0–20)

## 2019-08-14 LAB — COMPREHENSIVE METABOLIC PANEL
ALT: 34 U/L (ref 0–44)
AST: 28 U/L (ref 15–41)
Albumin: 4 g/dL (ref 3.5–5.0)
Alkaline Phosphatase: 55 U/L (ref 38–126)
Anion gap: 10 (ref 5–15)
BUN: 26 mg/dL — ABNORMAL HIGH (ref 8–23)
CO2: 29 mmol/L (ref 22–32)
Calcium: 9 mg/dL (ref 8.9–10.3)
Chloride: 100 mmol/L (ref 98–111)
Creatinine, Ser: 1.33 mg/dL — ABNORMAL HIGH (ref 0.61–1.24)
GFR calc Af Amer: >60 mL/min (ref >60)
GFR calc non Af Amer: 55 mL/min — ABNORMAL LOW (ref >60)
Glucose, Bld: 143 mg/dL — ABNORMAL HIGH (ref 70–99)
Potassium: 4.2 mmol/L (ref 3.5–5.1)
Sodium: 139 mmol/L (ref 135–145)
Total Bilirubin: 0.4 mg/dL (ref 0.3–1.2)
Total Protein: 7.2 g/dL (ref 6.5–8.1)

## 2019-08-14 LAB — HEMOGLOBIN A1C
Hgb A1c MFr Bld: 6.5 % — ABNORMAL HIGH (ref 4.8–5.6)
Mean Plasma Glucose: 139.85 mg/dL

## 2019-08-14 NOTE — Progress Notes (Signed)
Virtual Visit via telephone Note  This visit type was conducted due to national recommendations for restrictions regarding the COVID-19 pandemic (e.g. social distancing).  This format is felt to be most appropriate for this patient at this time.  All issues noted in this document were discussed and addressed.  No physical exam was performed (except for noted visual exam findings with Video Visits).   I connected with Justin Patel today at 10:00 AM EST by telephone and verified that I am speaking with the correct person using two identifiers. Location patient: home Location provider: work or home office Persons participating in the virtual visit: patient, provider  I discussed the limitations, risks, security and privacy concerns of performing an evaluation and management service by telephone and the availability of in person appointments. I also discussed with the patient that there may be a patient responsible charge related to this service. The patient expressed understanding and agreed to proceed.  Interactive audio and video telecommunications were attempted between this provider and patient, however failed, due to patient having technical difficulties OR patient did not have access to video capability.  We continued and completed visit with audio only.   Reason for visit: follow-up  HPI: Itching/arthralgia: Patient notes chronic pain at his joints in his ankles, knees, wrist, and elbows.  Feels as though his bones are painful.  He has itching over these joints as well.  Notes that the itching has been going on since he had surgery on his back previously.  He is on a new medicine for essential tremor called primidone.  He had the symptoms prior to starting that medication.  No detergent changes.  He notes the itching does not resolve with stopping his tramadol.  He does note occasional purpura and has a history of that in the past.  He does bruise easily.  He has been evaluated by oncology  for leukocytosis with weight loss and extreme fatigue.  Work-up was negative.  Back pain: This is a chronic issue.  He also notes neck pain.  He is seeing his neurosurgeon in late January.  He had some numbness to his jaw which he saw neurology for and they felt may be related to TMJ.  He also feels his joints are inflamed.  Also notes pain in his groin area and they feel as though that may be related to a compressed nerve.  He did undergo imaging recently and he will discuss that with his neurosurgeon at follow-up.  Diabetes: Notes his sugars have been running in the upper 60s.  He is taking Tradjenta.  No polyuria or polydipsia.  No hypoglycemia.  Fall: Occurred 1 week ago.  He caught himself on his right hand and his right thumb has been sore.  He can move it though it does hurt quite a bit.  He notes he will not reveal what he was doing to cause the fall.  Lump under jaw: Patient notes this has been there for a couple of months.  It has not gotten smaller.  It has been stable in size.  Notes its greater on the right than the left.  It is somewhat tender.   ROS: See pertinent positives and negatives per HPI.  Past Medical History:  Diagnosis Date  . 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 Anxiety   . Arthritis   . 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  removal scheduled to clear margins.  Marland Kitchen  CHF (congestive heart failure) ([place])    no longer after weight loss  . Chronic kidney disease    cysts  . Degenerative disc disease   . Diabetes mellitus    no longer diabetic-or on meds  . Family history of adverse reaction to anesthesia    sons wake up combative  . Gastric ulcer   . GERD (gastroesophageal reflux disease)   . H/O hiatal hernia   . Headache(784.0)    sinus  . Heart murmur   . Hx of congestive heart failure   . Hyperlipidemia   . Hypertension   . Iron deficiency anemia 02/28/2015  . Opiate abuse, continuous ([place]) 06/20/2015   . Sacral fracture, closed ([place])   . Seizures ([place])    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, after GI bleed  . Sleep apnea    hx not now since wt loss  . Stones in the urinary tract   . Syncope and collapse     Past Surgical History:  Procedure Laterality Date  . ANTERIOR CERVICAL DECOMP/DISCECTOMY FUSION  01/26/2012   Procedure: ANTERIOR CERVICAL DECOMPRESSION/DISCECTOMY FUSION 3 LEVELS;  Surgeon: Floyce Stakes, MD;  [facility];  Service: Neurosurgery;  Laterality: N/A;  Cervical three-four Cervical four-five Cervical five-six Cervical six-seven , Anterior cervical decompression/diskectomy, fusion, plate  . ANTERIOR LAT LUMBAR FUSION Right 04/25/2018   Procedure: Right Lumbar two-three Lumbar three-four Lumbar four-five anterior  lateral interbody fusion  with posterior percutaneous pedicle screws;  Surgeon: Erline Levine, MD;  Location: [place];  Service: Neurosurgery;  Laterality: Right;  . APPENDECTOMY    . [place], normal  . CARDIAC CATHETERIZATION     [place]  . COLONOSCOPY WITH PROPOFOL N/A 12/27/2017   Procedure: COLONOSCOPY WITH PROPOFOL;  Surgeon: Toledo, Benay Pike, MD;  Location: [facility];  Service: Gastroenterology;  Laterality: N/A;  . ESOPHAGOGASTRODUODENOSCOPY (EGD) WITH PROPOFOL N/A 12/27/2017   Procedure: ESOPHAGOGASTRODUODENOSCOPY (EGD) WITH PROPOFOL;  Surgeon: Toledo, Benay Pike, MD;  Location: [facility];  Service: Gastroenterology;  Laterality: N/A;  . GASTRIC BYPASS  2010   [place]  . HERNIA REPAIR  2000   hiatal  . JOINT REPLACEMENT    . KNEE ARTHROSCOPY     bilateral, left x 2  . LUMBAR PERCUTANEOUS PEDICLE SCREW 3 LEVEL N/A 04/25/2018   Procedure: LUMBAR PERCUTANEOUS PEDICLE SCREW 3 LEVEL;  Surgeon: Erline Levine, MD;  Location: [place];  Service: Neurosurgery;  Laterality: N/A;  . NASAL SINUS SURGERY     x5  . OSTEOTOMY  2001   left  . TONSILLECTOMY    . TOTAL KNEE ARTHROPLASTY Left 05/2014    Dr. Marry Guan  . UVULOPALATOPLASTY  2011    Family History  Problem Relation Age of Onset  . Dementia Mother 82  . Osteoporosis Mother   . Heart disease Father 81  . Diabetes Father   . Lymphoma Sister        lymphoma, stage 4  . Ovarian cancer Sister 74       Ovarian  . Lupus Sister   . Prostate cancer Maternal Uncle   . Skin cancer Maternal Uncle   . Tongue cancer Maternal Uncle        uncle died of heart attack  . Alcoholism Maternal Uncle   . Lung cancer Paternal Aunt        pat aunts x 4 died of lung cancer  . Lung cancer Paternal Aunt   . Lung cancer Paternal  Aunt   . Lung cancer Paternal Aunt   . Mesothelioma Cousin        paternal cousin    SOCIAL HX: Non-smoker   Current Outpatient Medications:  .  acetaminophen (TYLENOL) 500 MG tablet, Take 1,000 mg by mouth 3 (three) times daily as needed for moderate pain or headache. , Disp: , Rfl:  .  amLODipine (NORVASC) 10 MG tablet, Take 1 tablet (10 mg total) by mouth daily., Disp: 90 tablet, Rfl: 1 .  blood glucose meter kit and supplies KIT, Dispense based on patient and insurance preference. Check once daily. ICD doing E11.9., Disp: 1 each, Rfl: 0 .  diphenhydrAMINE (BENADRYL) 25 MG tablet, Take 25 mg by mouth daily as needed for allergies., Disp: , Rfl:  .  doxycycline (VIBRA-TABS) 100 MG tablet, Take 1 tablet (100 mg total) by mouth 2 (two) times daily., Disp: 14 tablet, Rfl: 0 .  DULoxetine (CYMBALTA) 60 MG capsule, TAKE 1 CAPSULE BY MOUTH EVERY DAY, Disp: 90 capsule, Rfl: 1 .  esomeprazole (NEXIUM) 40 MG capsule, TAKE 1 CAPSULE BY MOUTH ONCE DAILY, Disp: 90 capsule, Rfl: 1 .  furosemide (LASIX) 20 MG tablet, TAKE ONE TABLET BY MOUTH EVERY DAY, Disp: 90 tablet, Rfl: 1 .  losartan (COZAAR) 100 MG tablet, Take 100 mg by mouth daily., Disp: , Rfl:  .  metoprolol succinate (TOPROL-XL) 50 MG 24 hr tablet, TAKE 1 TABLET BY MOUTH DAILY WITH OR IMMEDIATLY FOLLOWING A MEAL, Disp: 30 tablet, Rfl: 0 .  Multiple Vitamin  (MULTIVITAMIN) capsule, Take 1 capsule by mouth daily. With copper and zinc, Disp: , Rfl:  .  nystatin cream (MYCOSTATIN), Apply 1 application topically 2 (two) times daily. (Patient taking differently: Apply 1 application topically 2 (two) times daily as needed (dermatitis). ), Disp: 30 g, Rfl: 0 .  nystatin-triamcinolone ointment (MYCOLOG), Apply 1 application topically 2 (two) times daily., Disp: 30 g, Rfl: 0 .  primidone (MYSOLINE) 50 MG tablet, Take 1/2 tablet at bedtime for 7 days, then increase to 1 tablet at bedtime, Disp: 30 tablet, Rfl: 0 .  rosuvastatin (CRESTOR) 40 MG tablet, Take 1 tablet (40 mg total) by mouth daily., Disp: 90 tablet, Rfl: 1 .  sucralfate (CARAFATE) 1 g tablet, TAKE ONE TABLET BY MOUTH FOUR TIMES DAILY, Disp: 360 tablet, Rfl: 1 .  tamsulosin (FLOMAX) 0.4 MG CAPS capsule, Take 1 capsule (0.4 mg total) by mouth daily., Disp: 90 capsule, Rfl: 3 .  TRADJENTA 5 MG TABS tablet, TAKE ONE TABLET EVERY DAY, Disp: 90 tablet, Rfl: 1 .  traMADol (ULTRAM) 50 MG tablet, Take by mouth every 6 (six) hours as needed. Take every 6 hours ( 4 times daily) as needed for pain, Disp: , Rfl:  .  Vitamin D, Ergocalciferol, (DRISDOL) 1.25 MG (50000 UT) CAPS capsule, TAKE 1 CAPSULE EVERY 7 DAYS, Disp: 8 capsule, Rfl: 0  EXAM: This is a telehealth telephone visit and thus no physical exam was completed.  ASSESSMENT AND PLAN:  Discussed the following assessment and plan:  Diabetes ([place]) A1c to be checked.  CBGs in the upper 60s are low.  Could consider stopping Tradjenta.  DDD (degenerative disc disease), lumbar He is getting keep his appointment with neurosurgery.  They will help determine the next step in management.  Arthralgia This is been going on for some time now.  He does have itching over his joints as well.  We will check some lab work as outlined below.  Evaluate his bilirubin for cause of itching as  well.  Pain of right thumb Occurred during the fall.  Given persistent  discomfort we will get an x-ray.  Mass of submandibular region Discussed that this needed to be evaluated.  CT scan ordered.   Orders Placed This Encounter  Procedures  . CT Soft Tissue Neck W Contrast    Standing Status:   Future    Standing Expiration Date:   11/11/2020    Order Specific Question:   ** REASON FOR EXAM (FREE TEXT)    Answer:   submandibular mass    Order Specific Question:   If indicated for the ordered procedure, I authorize the administration of contrast media per Radiology protocol    Answer:   Yes    Order Specific Question:   Preferred imaging location?    Answer:   [facility]    Order Specific Question:   Radiology Contrast Protocol - do NOT remove file path    Answer:   _0 [email or web address]  . DG Finger Thumb Right    Standing Status:   Future    Number of Occurrences:   1    Standing Expiration Date:   10/11/2020    Order Specific Question:   Reason for Exam (SYMPTOM  OR DIAGNOSIS REQUIRED)    Answer:   right thumb injury, hyperextended, occurred one week ago    Order Specific Question:   Preferred imaging location?    Answer:   [facility]    Order Specific Question:   Radiology Contrast Protocol - do NOT remove file path    Answer:   _1 [email or web address]s.pdf  . Antinuclear Antib (ANA)    Standing Status:   Future    Number of Occurrences:   1    Standing Expiration Date:   08/13/2020  . Rheumatoid Factor    Standing Status:   Future    Number of Occurrences:   1    Standing Expiration Date:   08/13/2020  . Sedimentation rate    Standing Status:   Future    Number of Occurrences:   1    Standing Expiration Date:   08/13/2020  . CYCLIC CITRUL PEPTIDE ANTIBODY, IGG/IGA    Standing Status:   Future    Number of Occurrences:   1    Standing Expiration Date:   08/13/2020  . CBC w/Diff    Standing Status:   Future    Number of Occurrences:   1    Standing Expiration Date:   08/13/2020  .  Comp Met (CMET)    Standing Status:   Future    Number of Occurrences:   1    Standing Expiration Date:   08/13/2020  . HgB A1c    Standing Status:   Future    Number of Occurrences:   1    Standing Expiration Date:   08/13/2020    No orders of the defined types were placed in this encounter.    I discussed the assessment and treatment plan with the patient. The patient was provided an opportunity to ask questions and all were answered. The patient agreed with the plan and demonstrated an understanding of the instructions.   The patient was advised to call back or seek an in-person evaluation if the symptoms worsen or if the condition fails to improve as anticipated.  I provided 26 minutes of non-face-to-face time during this encounter.   [name], MD

## 2019-08-15 ENCOUNTER — Telehealth: Payer: Self-pay | Admitting: Family Medicine

## 2019-08-15 LAB — RHEUMATOID FACTOR: Rheumatoid fact SerPl-aCnc: <10 IU/mL (ref 0.0–13.9)

## 2019-08-15 LAB — ANA: Anti Nuclear Antibody (ANA): NEGATIVE

## 2019-08-15 NOTE — Telephone Encounter (Signed)
Pt called office and would like x-ray results on his thumb as soon as possible.

## 2019-08-16 LAB — CYCLIC CITRUL PEPTIDE ANTIBODY, IGG/IGA: CCP Antibodies IgG/IgA: 5 units (ref 0–19)

## 2019-08-16 NOTE — Telephone Encounter (Signed)
I called and gave the patient his xray of his thumb results and he understood. Patient inquired about his blood work, can you look at that and advise.  Caci Orren,cma

## 2019-08-17 DIAGNOSIS — M2550 Pain in unspecified joint: Secondary | ICD-10-CM | POA: Insufficient documentation

## 2019-08-17 DIAGNOSIS — M79644 Pain in right finger(s): Secondary | ICD-10-CM | POA: Insufficient documentation

## 2019-08-17 DIAGNOSIS — R220 Localized swelling, mass and lump, head: Secondary | ICD-10-CM | POA: Insufficient documentation

## 2019-08-17 NOTE — Assessment & Plan Note (Signed)
He is getting keep his appointment with neurosurgery.  They will help determine the next step in management.

## 2019-08-17 NOTE — Assessment & Plan Note (Signed)
Discussed that this needed to be evaluated.  CT scan ordered.

## 2019-08-17 NOTE — Assessment & Plan Note (Signed)
This is been going on for some time now.  He does have itching over his joints as well.  We will check some lab work as outlined below.  Evaluate his bilirubin for cause of itching as well.

## 2019-08-17 NOTE — Assessment & Plan Note (Signed)
Occurred during the fall.  Given persistent discomfort we will get an x-ray.

## 2019-08-17 NOTE — Assessment & Plan Note (Signed)
A1c to be checked.  CBGs in the upper 60s are low.  Could consider stopping Tradjenta.

## 2019-08-18 NOTE — Telephone Encounter (Signed)
See result note.  

## 2019-08-27 ENCOUNTER — Other Ambulatory Visit: Payer: Self-pay | Admitting: Neurology

## 2019-09-03 ENCOUNTER — Other Ambulatory Visit: Payer: Self-pay

## 2019-09-03 ENCOUNTER — Ambulatory Visit
Admission: RE | Admit: 2019-09-03 | Discharge: 2019-09-03 | Disposition: A | Discharge status: Home / Self Care | Payer: Medicare PPO | Source: Ambulatory Visit | Attending: Family Medicine | Admitting: Family Medicine

## 2019-09-03 DIAGNOSIS — M4316 Spondylolisthesis, lumbar region: Secondary | ICD-10-CM | POA: Diagnosis not present

## 2019-09-03 DIAGNOSIS — I10 Essential (primary) hypertension: Secondary | ICD-10-CM | POA: Diagnosis not present

## 2019-09-03 DIAGNOSIS — M5440 Lumbago with sciatica, unspecified side: Secondary | ICD-10-CM | POA: Diagnosis not present

## 2019-09-03 DIAGNOSIS — R221 Localized swelling, mass and lump, neck: Secondary | ICD-10-CM | POA: Diagnosis not present

## 2019-09-03 DIAGNOSIS — R220 Localized swelling, mass and lump, head: Secondary | ICD-10-CM | POA: Diagnosis not present

## 2019-09-03 DIAGNOSIS — M4126 Other idiopathic scoliosis, lumbar region: Secondary | ICD-10-CM | POA: Diagnosis not present

## 2019-09-03 DIAGNOSIS — M5416 Radiculopathy, lumbar region: Secondary | ICD-10-CM | POA: Diagnosis not present

## 2019-09-03 DIAGNOSIS — Z6831 Body mass index (BMI) 31.0-31.9, adult: Secondary | ICD-10-CM | POA: Diagnosis not present

## 2019-09-03 DIAGNOSIS — M542 Cervicalgia: Secondary | ICD-10-CM | POA: Diagnosis not present

## 2019-09-03 MED ORDER — IOHEXOL 300 MG/ML  SOLN
75.0000 mL | Freq: Once | INTRAMUSCULAR | Status: AC | PRN
  Administered 2019-09-03: 75 mL via INTRAVENOUS

## 2019-09-11 ENCOUNTER — Other Ambulatory Visit: Payer: Self-pay | Admitting: Family Medicine

## 2019-09-11 DIAGNOSIS — I10 Essential (primary) hypertension: Secondary | ICD-10-CM

## 2019-09-13 ENCOUNTER — Other Ambulatory Visit: Payer: Self-pay

## 2019-09-13 ENCOUNTER — Ambulatory Visit (INDEPENDENT_AMBULATORY_CARE_PROVIDER_SITE_OTHER): Payer: Medicare PPO | Admitting: Cardiovascular Disease

## 2019-09-13 ENCOUNTER — Encounter: Payer: Self-pay | Admitting: Cardiovascular Disease

## 2019-09-13 VITALS — BP 110/84 | HR 90 | Temp 97.3°F | Ht 67.0 in | Wt 196.8 lb

## 2019-09-13 DIAGNOSIS — I10 Essential (primary) hypertension: Secondary | ICD-10-CM | POA: Diagnosis not present

## 2019-09-13 DIAGNOSIS — E785 Hyperlipidemia, unspecified: Secondary | ICD-10-CM | POA: Diagnosis not present

## 2019-09-13 DIAGNOSIS — I5032 Chronic diastolic (congestive) heart failure: Secondary | ICD-10-CM

## 2019-09-13 NOTE — Progress Notes (Signed)
Cardiology Office Note   Date:  09/13/2019   ID:  Justin Patel, DOB 1951/06/23, MRN [id number]  PCP:  [name], MD  Cardiologist:   Kathlyn Sacramento, MD   Chief Complaint  Patient presents with  . office visit    6 month F/U; Meds verbally reviewed with patient.      History of Present Illness: Justin Patel is a 69 y.o. male who presents for a follow-up visit regarding chronic diastolic heart failure and presyncope versus seizure disorder.  He has known history of morbid obesity status post gastric bypass, iron deficiency anemia, diabetes mellitus, essential hypertension, hyperlipidemia, anxiety, prior alcohol abuse, degenerative disc disease with previous back surgery and GERD. He was seen by Thurmond Butts last year for exertional fatigue.  He had an echocardiogram done in June which showed an EF of 55 to 60% with no significant valvular abnormalities. He has been doing reasonably well with no recent chest pain or worsening dyspnea.  No significant leg edema.  Past Medical History:  Diagnosis Date  . 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 Anxiety   . Arthritis   . 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  removal scheduled to clear margins.  . CHF (congestive heart failure) ([place])    no longer after weight loss  . Chronic kidney disease    cysts  . Degenerative disc disease   . Diabetes mellitus    no longer diabetic-or on meds  . Family history of adverse reaction to anesthesia    sons wake up combative  . Gastric ulcer   . GERD (gastroesophageal reflux disease)   . H/O hiatal hernia   . Headache(784.0)    sinus  . Heart murmur   . Hx of congestive heart failure   . Hyperlipidemia   . Hypertension   . Iron deficiency anemia 02/28/2015  . Opiate abuse, continuous ([place]) 06/20/2015  . Sacral fracture, closed ([place])   . Seizures ([place])    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, after GI bleed  . Sleep apnea    hx not  now since wt loss  . Stones in the urinary tract   . Syncope and collapse     Past Surgical History:  Procedure Laterality Date  . ANTERIOR CERVICAL DECOMP/DISCECTOMY FUSION  01/26/2012   Procedure: ANTERIOR CERVICAL DECOMPRESSION/DISCECTOMY FUSION 3 LEVELS;  Surgeon: Floyce Stakes, MD;  [facility];  Service: Neurosurgery;  Laterality: N/A;  Cervical three-four Cervical four-five Cervical five-six Cervical six-seven , Anterior cervical decompression/diskectomy, fusion, plate  . ANTERIOR LAT LUMBAR FUSION Right 04/25/2018   Procedure: Right Lumbar two-three Lumbar three-four Lumbar four-five anterior  lateral interbody fusion  with posterior percutaneous pedicle screws;  Surgeon: Erline Levine, MD;  Location: [place];  Service: Neurosurgery;  Laterality: Right;  . APPENDECTOMY    . [place], normal  . CARDIAC CATHETERIZATION     [place]  . COLONOSCOPY WITH PROPOFOL N/A 12/27/2017   Procedure: COLONOSCOPY WITH PROPOFOL;  Surgeon: Toledo, Benay Pike, MD;  Location: [facility];  Service: Gastroenterology;  Laterality: N/A;  . ESOPHAGOGASTRODUODENOSCOPY (EGD) WITH PROPOFOL N/A 12/27/2017   Procedure: ESOPHAGOGASTRODUODENOSCOPY (EGD) WITH PROPOFOL;  Surgeon: Toledo, Benay Pike, MD;  Location: [facility];  Service: Gastroenterology;  Laterality: N/A;  . GASTRIC BYPASS  2010   [place]  . HERNIA REPAIR  2000   hiatal  . JOINT  REPLACEMENT    . KNEE ARTHROSCOPY     bilateral, left x 2  . LUMBAR PERCUTANEOUS PEDICLE SCREW 3 LEVEL N/A 04/25/2018   Procedure: LUMBAR PERCUTANEOUS PEDICLE SCREW 3 LEVEL;  Surgeon: Erline Levine, MD;  Location: [place];  Service: Neurosurgery;  Laterality: N/A;  . NASAL SINUS SURGERY     x5  . OSTEOTOMY  2001   left  . TONSILLECTOMY    . TOTAL KNEE ARTHROPLASTY Left 05/2014   Dr. Marry Guan  . UVULOPALATOPLASTY  2011     Current Outpatient Medications  Medication Sig Dispense Refill  . acetaminophen  (TYLENOL) 500 MG tablet Take 1,000 mg by mouth 3 (three) times daily as needed for moderate pain or headache.     Marland Kitchen amLODipine (NORVASC) 10 MG tablet Take 1 tablet (10 mg total) by mouth daily. 90 tablet 1  . blood glucose meter kit and supplies KIT Dispense based on patient and insurance preference. Check once daily. ICD doing E11.9. 1 each 0  . diphenhydrAMINE (BENADRYL) 25 MG tablet Take 25 mg by mouth daily as needed for allergies.    . DULoxetine (CYMBALTA) 60 MG capsule TAKE 1 CAPSULE BY MOUTH EVERY DAY 90 capsule 1  . esomeprazole (NEXIUM) 40 MG capsule TAKE 1 CAPSULE BY MOUTH ONCE DAILY 90 capsule 1  . furosemide (LASIX) 20 MG tablet TAKE ONE TABLET BY MOUTH EVERY DAY 90 tablet 1  . metoprolol succinate (TOPROL-XL) 50 MG 24 hr tablet TAKE 1 TABLET BY MOUTH DAILY WITH OR IMMEDIATLY FOLLOWING A MEAL 90 tablet 1  . Multiple Vitamin (MULTIVITAMIN) capsule Take 1 capsule by mouth daily. With copper and zinc    . nystatin cream (MYCOSTATIN) Apply 1 application topically 2 (two) times daily. (Patient taking differently: Apply 1 application topically 2 (two) times daily as needed (dermatitis). ) 30 g 0  . nystatin-triamcinolone ointment (MYCOLOG) Apply 1 application topically 2 (two) times daily. 30 g 0  . primidone (MYSOLINE) 50 MG tablet TAKE 1/2 TABLET AT BEDTIME FOR 7 DAYS THEN INCREASE TO TAKE ONE TABLET AT BEDTIME (Patient taking differently: Take 50 mg by mouth at bedtime. ) 30 tablet 0  . rosuvastatin (CRESTOR) 40 MG tablet Take 1 tablet (40 mg total) by mouth daily. 90 tablet 1  . sucralfate (CARAFATE) 1 g tablet TAKE ONE TABLET BY MOUTH FOUR TIMES DAILY 360 tablet 1  . tamsulosin (FLOMAX) 0.4 MG CAPS capsule Take 1 capsule (0.4 mg total) by mouth daily. 90 capsule 3  . TRADJENTA 5 MG TABS tablet TAKE ONE TABLET BY MOUTH EVERY DAY 90 tablet 1  . traMADol (ULTRAM) 50 MG tablet Take by mouth every 6 (six) hours as needed. Take every 6 hours ( 4 times daily) as needed for pain    . Vitamin D,  Ergocalciferol, (DRISDOL) 1.25 MG (50000 UT) CAPS capsule TAKE 1 CAPSULE EVERY 7 DAYS 8 capsule 0  . Zoledronic Acid (RECLAST IV) Inject into the vein. annually    . doxycycline (VIBRA-TABS) 100 MG tablet Take 1 tablet (100 mg total) by mouth 2 (two) times daily. 14 tablet 0   No current facility-administered medications for this visit.    Allergies:   Altace [ramipril], Levaquin [levofloxacin in d5w], Metformin, Levofloxacin, Trulicity [dulaglutide], Conray [iothalamate], and Lyrica [pregabalin]    Social History:  The patient  reports that he has never smoked. He has never used smokeless tobacco. He reports that he does not drink alcohol or use drugs.   Family History:  The patient's family history  includes Alcoholism in his maternal uncle; Dementia (age of onset: 4) in his mother; Diabetes in his father; Heart disease (age of onset: 26) in his father; Lung cancer in his paternal aunt, paternal aunt, paternal aunt, and paternal aunt; Lupus in his sister; Lymphoma in his sister; Mesothelioma in his cousin; Osteoporosis in his mother; Ovarian cancer (age of onset: 57) in his sister; Prostate cancer in his maternal uncle; Skin cancer in his maternal uncle; Tongue cancer in his maternal uncle.    ROS:  Please see the history of present illness.   Otherwise, review of systems are positive for none.   All other systems are reviewed and negative.    PHYSICAL EXAM: VS:  BP 110/84 (BP Location: Left Arm, Patient Position: Sitting, Cuff Size: Normal)   Pulse 90   Temp (!) 97.3 F (36.3 C)   Ht 5' 7"  (1.702 m)   Wt 196 lb 12 oz (89.2 kg)   SpO2 95%   BMI 30.82 kg/m  , BMI Body mass index is 30.82 kg/m. GEN: Well nourished, well developed, in no acute distress  HEENT: normal  Neck: no JVD, carotid bruits, or masses Cardiac: RRR; no  rubs, or gallops,no edema .  1 out of 6 systolic murmur in the aortic area Respiratory:  clear to auscultation bilaterally, normal work of breathing GI: soft,  nontender, nondistended, + BS MS: no deformity or atrophy  Skin: warm and dry, no rash Neuro:  Strength and sensation are intact Psych: euthymic mood, full affect   EKG:  EKG is ordered today. The ekg ordered today demonstrates normal sinus rhythm with no significant ST or T wave changes.  Isolated Q wave in lead III.   Recent Labs: 08/14/2019: ALT 34; BUN 26; Creatinine, Ser 1.33; Hemoglobin 13.1; Platelets 205; Potassium 4.2; Sodium 139    Lipid Panel    Component Value Date/Time   CHOL 171 03/14/2019 0000   TRIG 150 (H) 03/14/2019 0000   HDL 64 03/14/2019 0000   CHOLHDL 2.7 03/14/2019 0000   CHOLHDL 3 03/25/2016 1108   VLDL 23.6 03/25/2016 1108   LDLCALC 77 03/14/2019 0000   LDLDIRECT 78.0 03/21/2013 1410      Wt Readings from Last 3 Encounters:  09/13/19 196 lb 12 oz (89.2 kg)  08/14/19 192 lb (87.1 kg)  07/31/19 198 lb (89.8 kg)       No flowsheet data found.    ASSESSMENT AND PLAN:  1.  Exertional dyspnea: Seems to be stable overall.  He denies chest pain.  Echocardiogram last year was unremarkable.  2.  Chronic diastolic heart failure: He appears to be euvolemic on small dose furosemide.  3.  Essential hypertension: Blood pressure is controlled on Toprol and amlodipine.  4.  Hyperlipidemia: Currently on high-dose rosuvastatin with most recent LDL of 77.   Disposition: Follow-up in our clinic in 1 year.  Signed,  Kathlyn Sacramento, MD  09/13/2019 1:32 PM    [facility]

## 2019-09-13 NOTE — Patient Instructions (Signed)
Medication Instructions:  Your physician recommends that you continue on your current medications as directed. Please refer to the Current Medication list given to you today.  *If you need a refill on your cardiac medications before your next appointment, please call your pharmacy*  Lab Work: None ordered If you have labs (blood work) drawn today and your tests are completely normal, you will receive your results only by: . MyChart Message (if you have MyChart) OR . A paper copy in the mail If you have any lab test that is abnormal or we need to change your treatment, we will call you to review the results.  Testing/Procedures: None ordered  Follow-Up: At [facility], you and your health needs are our priority.  As part of our continuing mission to provide you with exceptional heart care, we have created designated Provider Care Teams.  These Care Teams include your primary Cardiologist (physician) and Advanced Practice Providers (APPs -  Physician Assistants and Nurse Practitioners) who all work together to provide you with the care you need, when you need it.  Your next appointment:   12 months  The format for your next appointment:   In Person  Provider:    You may see Dr. Arida or one of the following Advanced Practice Providers on your designated Care Team:    Christopher Berge, NP  Ryan Dunn, PA-C  Jacquelyn Visser, PA-C   Other Instructions N/A  

## 2019-09-19 ENCOUNTER — Other Ambulatory Visit: Payer: Self-pay | Admitting: Neurology

## 2019-09-24 ENCOUNTER — Other Ambulatory Visit: Payer: Self-pay | Admitting: Family Medicine

## 2019-09-24 DIAGNOSIS — M3500 Sicca syndrome, unspecified: Secondary | ICD-10-CM

## 2019-09-24 DIAGNOSIS — H04123 Dry eye syndrome of bilateral lacrimal glands: Secondary | ICD-10-CM

## 2019-09-28 ENCOUNTER — Other Ambulatory Visit (INDEPENDENT_AMBULATORY_CARE_PROVIDER_SITE_OTHER): Payer: Medicare PPO

## 2019-09-28 ENCOUNTER — Other Ambulatory Visit: Payer: Self-pay

## 2019-09-28 ENCOUNTER — Other Ambulatory Visit: Payer: Medicare PPO

## 2019-09-28 DIAGNOSIS — M3500 Sicca syndrome, unspecified: Secondary | ICD-10-CM

## 2019-09-28 DIAGNOSIS — H04123 Dry eye syndrome of bilateral lacrimal glands: Secondary | ICD-10-CM | POA: Diagnosis not present

## 2019-10-01 LAB — SJOGRENS SYNDROME-A EXTRACTABLE NUCLEAR ANTIBODY: SSA (Ro) (ENA) Antibody, IgG: <1 AI

## 2019-10-01 LAB — SJOGRENS SYNDROME-B EXTRACTABLE NUCLEAR ANTIBODY: SSB (La) (ENA) Antibody, IgG: <1 AI

## 2019-10-04 ENCOUNTER — Other Ambulatory Visit: Payer: Self-pay | Admitting: Physician Assistant

## 2019-10-04 DIAGNOSIS — E119 Type 2 diabetes mellitus without complications: Secondary | ICD-10-CM

## 2019-10-05 ENCOUNTER — Ambulatory Visit: Payer: Medicare PPO | Attending: Internal Medicine

## 2019-10-05 DIAGNOSIS — Z23 Encounter for immunization: Secondary | ICD-10-CM | POA: Insufficient documentation

## 2019-10-05 NOTE — Progress Notes (Signed)
   Covid-19 Vaccination Clinic  Name:  Justin Patel    MRN: [id number] DOB: January 08, 1951  10/05/2019  Justin Patel was observed post Covid-19 immunization for 15 minutes without incidence. He was provided with Vaccine Information Sheet and instruction to access the V-Safe system.   Justin Patel was instructed to call 911 with any severe reactions post vaccine: Marland Kitchen Difficulty breathing  . Swelling of your face and throat  . A fast heartbeat  . A bad rash all over your body  . Dizziness and weakness    Immunizations Administered    Name Date Dose VIS Date Route   Pfizer COVID-19 Vaccine 10/05/2019  8:19 AM 0.3 mL 07/20/2019 Intramuscular   Manufacturer: [place]   Lot: [id number]   [place]: [id number]

## 2019-10-23 ENCOUNTER — Other Ambulatory Visit: Payer: Self-pay | Admitting: Neurology

## 2019-10-30 ENCOUNTER — Ambulatory Visit: Payer: Medicare PPO | Attending: Internal Medicine

## 2019-10-30 DIAGNOSIS — Z23 Encounter for immunization: Secondary | ICD-10-CM

## 2019-10-30 NOTE — Progress Notes (Signed)
   Covid-19 Vaccination Clinic  Name:  Justin Patel    MRN: [id number] DOB: [birth year]  10/30/2019  [name] was observed post Covid-19 immunization for 15 minutes without incident. He was provided with Vaccine Information Sheet and instruction to access the V-Safe system.   [name] was instructed to call 911 with any severe reactions post vaccine: Marland Kitchen Difficulty breathing  . Swelling of face and throat  . A fast heartbeat  . A bad rash all over body  . Dizziness and weakness   Immunizations Administered    Name Date Dose VIS Date Route   Pfizer COVID-19 Vaccine 10/30/2019  9:11 AM 0.3 mL 07/20/2019 Intramuscular   Manufacturer: [place]   Lot: [id number]   [place]: [id number]

## 2019-10-31 DIAGNOSIS — M810 Age-related osteoporosis without current pathological fracture: Secondary | ICD-10-CM | POA: Diagnosis not present

## 2019-10-31 DIAGNOSIS — E559 Vitamin D deficiency, unspecified: Secondary | ICD-10-CM | POA: Diagnosis not present

## 2019-11-06 ENCOUNTER — Encounter: Payer: Self-pay | Admitting: Emergency Medicine

## 2019-11-06 ENCOUNTER — Emergency Department: Payer: Medicare PPO

## 2019-11-06 ENCOUNTER — Emergency Department
Admission: EM | Admit: 2019-11-06 | Discharge: 2019-11-06 | Disposition: A | Discharge status: Home / Self Care | Payer: Medicare PPO | Attending: Emergency Medicine | Admitting: Emergency Medicine

## 2019-11-06 ENCOUNTER — Other Ambulatory Visit: Payer: Self-pay

## 2019-11-06 DIAGNOSIS — Z7984 Long term (current) use of oral hypoglycemic drugs: Secondary | ICD-10-CM | POA: Insufficient documentation

## 2019-11-06 DIAGNOSIS — E1122 Type 2 diabetes mellitus with diabetic chronic kidney disease: Secondary | ICD-10-CM | POA: Diagnosis not present

## 2019-11-06 DIAGNOSIS — I130 Hypertensive heart and chronic kidney disease with heart failure and stage 1 through stage 4 chronic kidney disease, or unspecified chronic kidney disease: Secondary | ICD-10-CM | POA: Insufficient documentation

## 2019-11-06 DIAGNOSIS — N183 Chronic kidney disease, stage 3 unspecified: Secondary | ICD-10-CM | POA: Insufficient documentation

## 2019-11-06 DIAGNOSIS — S32010A Wedge compression fracture of first lumbar vertebra, initial encounter for closed fracture: Secondary | ICD-10-CM | POA: Insufficient documentation

## 2019-11-06 DIAGNOSIS — R2243 Localized swelling, mass and lump, lower limb, bilateral: Secondary | ICD-10-CM | POA: Insufficient documentation

## 2019-11-06 DIAGNOSIS — M546 Pain in thoracic spine: Secondary | ICD-10-CM | POA: Diagnosis not present

## 2019-11-06 DIAGNOSIS — W19XXXA Unspecified fall, initial encounter: Secondary | ICD-10-CM | POA: Insufficient documentation

## 2019-11-06 DIAGNOSIS — R9431 Abnormal electrocardiogram [ECG] [EKG]: Secondary | ICD-10-CM | POA: Diagnosis not present

## 2019-11-06 DIAGNOSIS — R609 Edema, unspecified: Secondary | ICD-10-CM | POA: Diagnosis not present

## 2019-11-06 DIAGNOSIS — R600 Localized edema: Secondary | ICD-10-CM

## 2019-11-06 DIAGNOSIS — Z981 Arthrodesis status: Secondary | ICD-10-CM | POA: Insufficient documentation

## 2019-11-06 DIAGNOSIS — I509 Heart failure, unspecified: Secondary | ICD-10-CM | POA: Insufficient documentation

## 2019-11-06 DIAGNOSIS — M549 Dorsalgia, unspecified: Secondary | ICD-10-CM | POA: Diagnosis not present

## 2019-11-06 DIAGNOSIS — M859 Disorder of bone density and structure, unspecified: Secondary | ICD-10-CM | POA: Diagnosis not present

## 2019-11-06 DIAGNOSIS — Y999 Unspecified external cause status: Secondary | ICD-10-CM | POA: Insufficient documentation

## 2019-11-06 DIAGNOSIS — Y9389 Activity, other specified: Secondary | ICD-10-CM | POA: Diagnosis not present

## 2019-11-06 DIAGNOSIS — S3992XA Unspecified injury of lower back, initial encounter: Secondary | ICD-10-CM | POA: Diagnosis present

## 2019-11-06 DIAGNOSIS — Y929 Unspecified place or not applicable: Secondary | ICD-10-CM | POA: Insufficient documentation

## 2019-11-06 DIAGNOSIS — M7989 Other specified soft tissue disorders: Secondary | ICD-10-CM | POA: Diagnosis not present

## 2019-11-06 DIAGNOSIS — S299XXA Unspecified injury of thorax, initial encounter: Secondary | ICD-10-CM | POA: Insufficient documentation

## 2019-11-06 LAB — URINALYSIS, COMPLETE (UACMP) WITH MICROSCOPIC
Bacteria, UA: NONE SEEN
Bilirubin Urine: NEGATIVE
Glucose, UA: NEGATIVE mg/dL
Hgb urine dipstick: NEGATIVE
Ketones, ur: NEGATIVE mg/dL
Leukocytes,Ua: NEGATIVE
Nitrite: NEGATIVE
Protein, ur: 30 mg/dL — AB
Specific Gravity, Urine: 1.018 (ref 1.005–1.030)
Squamous Epithelial / HPF: NONE SEEN (ref 0–5)
pH: 6 (ref 5.0–8.0)

## 2019-11-06 LAB — CBC WITH DIFFERENTIAL/PLATELET
Abs Immature Granulocytes: 0.05 10*3/uL (ref 0.00–0.07)
Basophils Absolute: 0 10*3/uL (ref 0.0–0.1)
Basophils Relative: 0 %
Eosinophils Absolute: 0.2 10*3/uL (ref 0.0–0.5)
Eosinophils Relative: 3 %
HCT: 39 % (ref 39.0–52.0)
Hemoglobin: 12.9 g/dL — ABNORMAL LOW (ref 13.0–17.0)
Immature Granulocytes: 1 %
Lymphocytes Relative: 23 %
Lymphs Abs: 1.7 10*3/uL (ref 0.7–4.0)
MCH: 32.6 pg (ref 26.0–34.0)
MCHC: 33.1 g/dL (ref 30.0–36.0)
MCV: 98.5 fL (ref 80.0–100.0)
Monocytes Absolute: 0.6 10*3/uL (ref 0.1–1.0)
Monocytes Relative: 9 %
Neutro Abs: 4.7 10*3/uL (ref 1.7–7.7)
Neutrophils Relative %: 64 %
Platelets: 145 10*3/uL — ABNORMAL LOW (ref 150–400)
RBC: 3.96 MIL/uL — ABNORMAL LOW (ref 4.22–5.81)
RDW: 14.3 % (ref 11.5–15.5)
WBC: 7.3 10*3/uL (ref 4.0–10.5)
nRBC: 0 % (ref 0.0–0.2)

## 2019-11-06 LAB — COMPREHENSIVE METABOLIC PANEL
ALT: 34 U/L (ref 0–44)
AST: 26 U/L (ref 15–41)
Albumin: 3.7 g/dL (ref 3.5–5.0)
Alkaline Phosphatase: 89 U/L (ref 38–126)
Anion gap: 11 (ref 5–15)
BUN: 23 mg/dL (ref 8–23)
CO2: 27 mmol/L (ref 22–32)
Calcium: 8.9 mg/dL (ref 8.9–10.3)
Chloride: 101 mmol/L (ref 98–111)
Creatinine, Ser: 1.37 mg/dL — ABNORMAL HIGH (ref 0.61–1.24)
GFR calc Af Amer: >60 mL/min (ref >60)
GFR calc non Af Amer: 53 mL/min — ABNORMAL LOW (ref >60)
Glucose, Bld: 139 mg/dL — ABNORMAL HIGH (ref 70–99)
Potassium: 4.2 mmol/L (ref 3.5–5.1)
Sodium: 139 mmol/L (ref 135–145)
Total Bilirubin: 0.4 mg/dL (ref 0.3–1.2)
Total Protein: 7.1 g/dL (ref 6.5–8.1)

## 2019-11-06 LAB — BRAIN NATRIURETIC PEPTIDE: B Natriuretic Peptide: 70 pg/mL (ref 0.0–100.0)

## 2019-11-06 LAB — TROPONIN I (HIGH SENSITIVITY)
Troponin I (High Sensitivity): 7 ng/L (ref <18)
Troponin I (High Sensitivity): 7 ng/L (ref <18)

## 2019-11-06 MED ORDER — MORPHINE SULFATE (PF) 4 MG/ML IV SOLN
4.0000 mg | Freq: Once | INTRAVENOUS | Status: AC
  Administered 2019-11-06: 4 mg via INTRAVENOUS
  Filled 2019-11-06: qty 1

## 2019-11-06 MED ORDER — HYDROCODONE-ACETAMINOPHEN 5-325 MG PO TABS
1.0000 | ORAL_TABLET | Freq: Once | ORAL | Status: AC
  Administered 2019-11-06: 1 via ORAL
  Filled 2019-11-06: qty 1

## 2019-11-06 MED ORDER — HYDROCODONE-ACETAMINOPHEN 5-325 MG PO TABS: 1.0000 | ORAL_TABLET | Freq: Four times a day (QID) | ORAL | 0 refills | Status: DC | PRN

## 2019-11-06 MED ORDER — ONDANSETRON HCL 4 MG/2ML IJ SOLN
4.0000 mg | Freq: Once | INTRAMUSCULAR | Status: AC
  Administered 2019-11-06: 4 mg via INTRAVENOUS
  Filled 2019-11-06: qty 2

## 2019-11-06 NOTE — Consult Note (Signed)
Neurosurgery-New Consultation Evaluation 11/06/2019 LONZA METHOT [id number]  Identifying Statement: Justin Patel is a 69 y.o. male from [place] [address] with compression fracture  Physician Requesting Consultation: [facility]  History of Present Illness: Justin Patel is here for evaluation of back pain and was found on CT scan to have a L1 compression fracture.  He states that he underwent an L2-5 fusion in 2019 for back and leg pain.  He states that this did help for some time but the pain ultimately returned.  He also deals with chronic joint pain throughout his lower extremities.  He is seen by his spine provider for chronic pain every 3 months.  He does feel like the pain medication is not helping as much as he used to.  He denies any new numbness or weakness but does have some baseline numbness in his lower extremities.  He is not endorsing any new radiating pain.  Past Medical History:  Past Medical History:  Diagnosis Date  . 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 Anxiety   . Arthritis   . 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  removal scheduled to clear margins.  . CHF (congestive heart failure) ([place])    no longer after weight loss  . Chronic kidney disease    cysts  . Degenerative disc disease   . Diabetes mellitus    no longer diabetic-or on meds  . Family history of adverse reaction to anesthesia    sons wake up combative  . Gastric ulcer   . GERD (gastroesophageal reflux disease)   . H/O hiatal hernia   . Headache(784.0)    sinus  . Heart murmur   . Hx of congestive heart failure   . Hyperlipidemia   . Hypertension   . Iron deficiency anemia 02/28/2015  . Opiate abuse, continuous ([place]) 06/20/2015  . Sacral fracture, closed ([place])   . Seizures ([place])    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, after GI bleed  . Sleep apnea    hx not now since wt loss  . Stones in the urinary tract   . Syncope and  collapse     Social History: Social History   Socioeconomic History  . Marital status: Married    Spouse name: Not on file  . Number of children: 2  . Years of education: Not on file  . Highest education level: Not on file  Occupational History  . Not on file  Tobacco Use  . Smoking status: Never Smoker  . Smokeless tobacco: Never Used  Substance and Sexual Activity  . Alcohol use: No  . Drug use: No  . Sexual activity: Yes    Partners: Female  Other Topics Concern  . Not on file  Social History Narrative   Lives in [place] with wife, [place]. 2 sons, Justin Patel, Justin Patel 51.. 4 grandchildren      Work - Retired, previously Nurse, learning disability in [place] - regular diet, limited quantities after gastric bypass      Exercise - no regular, limited by arthritis in knees, occasional water aerobics   Right handed   One story house   Social Determinants of Health   Financial Resource Strain:   . Difficulty of Paying Living Expenses:   Food Insecurity:   . Worried About Charity fundraiser in the Last Year:   . [employer] of  Food in the Last Year:   Transportation Needs:   . Film/video editor (Medical):   Marland Kitchen Lack of Transportation (Non-Medical):   Physical Activity: Sufficiently Active  . Days of Exercise per Week: 6 days  . Minutes of Exercise per Session: 60 min  Stress: No Stress Concern Present  . Feeling of Stress : Not at all  Social Connections:   . Frequency of Communication with Friends and Family:   . Frequency of Social Gatherings with Friends and Family:   . Attends Religious Services:   . Active Member of Clubs or Organizations:   . Attends Archivist Meetings:   Marland Kitchen Marital Status:   Intimate Partner Violence:   . Fear of Current or Ex-Partner:   . Emotionally Abused:   Marland Kitchen Physically Abused:   . Sexually Abused:     Family History: Family History  Problem Relation Age of Onset  . Dementia Mother 67  . Osteoporosis  Mother   . Heart disease Father 22  . Diabetes Father   . Lymphoma Sister        lymphoma, stage 4  . Ovarian cancer Sister 83       Ovarian  . Lupus Sister   . Prostate cancer Maternal Uncle   . Skin cancer Maternal Uncle   . Tongue cancer Maternal Uncle        uncle died of heart attack  . Alcoholism Maternal Uncle   . Lung cancer Paternal Aunt        pat aunts x 4 died of lung cancer  . Lung cancer Paternal Aunt   . Lung cancer Paternal Aunt   . Lung cancer Paternal Aunt   . Mesothelioma Cousin        paternal cousin    Review of Systems:  Review of Systems - General ROS: Negative Psychological ROS: Negative Ophthalmic ROS: Negative ENT ROS: Negative Hematological and Lymphatic ROS: Negative  Endocrine ROS: Negative Respiratory ROS: Negative Cardiovascular ROS: Negative Gastrointestinal ROS: Negative Genito-Urinary ROS: Negative Musculoskeletal ROS: Positive for back pain Neurological ROS: Positive for leg pain, numbness  Dermatological ROS: Negative  Physical Exam: BP 116/61 (BP Location: Left Arm)   Pulse 70   Temp 98.1 F (36.7 C) (Oral)   Resp 20   Ht 5' 5.5" (1.664 m)   Wt 86.6 kg   SpO2 98%   BMI 31.30 kg/m  Body mass index is 31.3 kg/m. Body surface area is 2 meters squared. General appearance: Alert, cooperative, in no acute distress Head: Normocephalic, atraumatic Eyes: Normal, EOM intact Oropharynx: Wearing facemask Ext: Moderate edema noted in bilateral lower extremities  Neurologic exam:  Mental status: alertness: alert,  affect: normal Speech: fluent and clear Motor:strength symmetric 5/5 in bilateral hip flexion, knee extension, dorsiflexion, plantarflexion Sensory: Decreased light touch in bilateral lower extremities circumferentially below the knee Gait: Not tested  Laboratory: Results for orders placed or performed during the hospital encounter of 11/06/19  Comprehensive metabolic panel  Result Value Ref Range   Sodium 139 135 -  145 mmol/L   Potassium 4.2 3.5 - 5.1 mmol/L   Chloride 101 98 - 111 mmol/L   CO2 27 22 - 32 mmol/L   Glucose, Bld 139 (H) 70 - 99 mg/dL   BUN 23 8 - 23 mg/dL   Creatinine, Ser 1.37 (H) 0.61 - 1.24 mg/dL   Calcium 8.9 8.9 - 10.3 mg/dL   Total Protein 7.1 6.5 - 8.1 g/dL   Albumin 3.7 3.5 - 5.0 g/dL  AST 26 15 - 41 U/L   ALT 34 0 - 44 U/L   Alkaline Phosphatase 89 38 - 126 U/L   Total Bilirubin 0.4 0.3 - 1.2 mg/dL   GFR calc non Af Amer 53 (L) >60 mL/min   GFR calc Af Amer >60 >60 mL/min   Anion gap 11 5 - 15  Brain natriuretic peptide  Result Value Ref Range   B Natriuretic Peptide 70.0 0.0 - 100.0 pg/mL  CBC with Differential  Result Value Ref Range   WBC 7.3 4.0 - 10.5 K/uL   RBC 3.96 (L) 4.22 - 5.81 MIL/uL   Hemoglobin 12.9 (L) 13.0 - 17.0 g/dL   HCT 39.0 39.0 - 52.0 %   MCV 98.5 80.0 - 100.0 fL   MCH 32.6 26.0 - 34.0 pg   MCHC 33.1 30.0 - 36.0 g/dL   RDW 14.3 11.5 - 15.5 %   Platelets 145 (L) 150 - 400 K/uL   nRBC 0.0 0.0 - 0.2 %   Neutrophils Relative % 64 %   Neutro Abs 4.7 1.7 - 7.7 K/uL   Lymphocytes Relative 23 %   Lymphs Abs 1.7 0.7 - 4.0 K/uL   Monocytes Relative 9 %   Monocytes Absolute 0.6 0.1 - 1.0 K/uL   Eosinophils Relative 3 %   Eosinophils Absolute 0.2 0.0 - 0.5 K/uL   Basophils Relative 0 %   Basophils Absolute 0.0 0.0 - 0.1 K/uL   Immature Granulocytes 1 %   Abs Immature Granulocytes 0.05 0.00 - 0.07 K/uL  Urinalysis, Complete w Microscopic  Result Value Ref Range   Color, Urine AMBER (A) YELLOW   APPearance HAZY (A) CLEAR   Specific Gravity, Urine 1.018 1.005 - 1.030   pH 6.0 5.0 - 8.0   Glucose, UA NEGATIVE NEGATIVE mg/dL   Hgb urine dipstick NEGATIVE NEGATIVE   Bilirubin Urine NEGATIVE NEGATIVE   Ketones, ur NEGATIVE NEGATIVE mg/dL   Protein, ur 30 (A) NEGATIVE mg/dL   Nitrite NEGATIVE NEGATIVE   Leukocytes,Ua NEGATIVE NEGATIVE   RBC / HPF 0-5 0 - 5 RBC/hpf   WBC, UA 0-5 0 - 5 WBC/hpf   Bacteria, UA NONE SEEN NONE SEEN   Squamous  Epithelial / LPF NONE SEEN 0 - 5   Mucus PRESENT    Hyaline Casts, UA PRESENT   Troponin I (High Sensitivity)  Result Value Ref Range   Troponin I (High Sensitivity) 7 <18 ng/L   I personally reviewed labs  Imaging: CT Lumbar Spine: 1. Acute benign-appearing moderate compression fracture of the superior endplate of L1 with 6 mm of protrusion of the posterior margin of L1 into the spinal canal with slight symmetrical compression of the thecal sac. 2. Interval interbody and posterior fusion at L2-3, L3-4 and [id number] with no complicating features.   Impression/Plan:  Mr. Frankfort is here for evaluation of an L1 compression fracture causing some back pain without any obvious new neurologic symptoms.  I discussed with him that he does have poor bone quality that he has a diagnosis of osteoporosis and is on annual injections.  I do think his current fracture is likely related to this.  I have recommended a TLSO brace and do feel he may need to wear this for a prolonged period to allow for healing.  Given his chronic pain, it would be very difficult to treat his pain from a medical standpoint but I encouraged him to keep his follow-up with his chronic pain provider.  If he needs a new  referral here, we are glad to provide.   1.  Diagnosis: L1 compression fracture  2.  Plan TLSO brace at all times, we can see in our clinic in 3 weeks for evaluation with x-rays or he can continue follow-up with his known spine provider

## 2019-11-06 NOTE — ED Notes (Signed)
ED Provider at bedside. 

## 2019-11-06 NOTE — ED Provider Notes (Addendum)
[facility] Emergency Department Provider Note  ____________________________________________   First MD Initiated Contact with Patient 11/06/19 1347     (approximate)  I have reviewed the triage vital signs and the nursing notes.   HISTORY  Chief Complaint Back Pain    HPI Justin Patel is a 69 y.o. male presents emergency department with complaints of 2 weeks of back pain.  States he fell 2 weeks ago.  History of lumbar and cervical fusions.  History of osteoporosis along with a long list of other health problems  including cancer, CHF, chronic kidney disease and diabetes.  Also states he has parathyroid disease.  Also noticed that the lower extremities are swelling.  This started yesterday.  No chest pain or shortness of breath at this time.   Past Medical History:  Diagnosis Date  . 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 Anxiety   . Arthritis   . Cancer ([place]) 02/01/16   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  removal scheduled to clear margins.  . CHF (congestive heart failure) ([place])    no longer after weight loss  . Chronic kidney disease    cysts  . Degenerative disc disease   . Diabetes mellitus    no longer diabetic-or on meds  . Family history of adverse reaction to anesthesia    sons wake up combative  . Gastric ulcer   . GERD (gastroesophageal reflux disease)   . H/O hiatal hernia   . Headache(784.0)    sinus  . Heart murmur   . Hx of congestive heart failure   . Hyperlipidemia   . Hypertension   . Iron deficiency anemia 02/28/2015  . Opiate abuse, continuous ([place]) 06/20/2015  . Sacral fracture, closed ([place])   . Seizures ([place])    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, after GI bleed  . Sleep apnea    hx not now since wt loss  . Stones in the urinary tract   . Syncope and collapse     Patient Active Problem List   Diagnosis Date Noted  . Arthralgia 08/17/2019  . Mass of submandibular region  08/17/2019  . Pain of right thumb 08/17/2019  . Numbness of face 05/22/2019  . Chronic night sweats 03/21/2019  . Chronic abdominal pain 02/27/2019  . Muscle weakness 08/24/2018  . Dyspnea on exertion 08/24/2018  . Depression, major, single episode, mild ([place]) 08/24/2018  . Macrocytosis without anemia 07/05/2018  . RLQ abdominal pain 05/18/2018  . CKD (chronic kidney disease) stage 3, GFR 30-59 ml/min 05/08/2018  . Lumbar scoliosis 04/25/2018  . Easy bruising 12/09/2017  . 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 Abnormal weight loss 10/18/2017  . GERD (gastroesophageal reflux disease) 10/18/2017  . Constipation 10/18/2017  . Osteoporosis without current pathological fracture 07/20/2017  . Vitamin D deficiency 07/20/2017  . Rash 03/11/2017  . Seborrheic dermatitis 02/24/2017  . Intertrigo 02/24/2017  . Tachycardia 09/27/2016  . Anxiety 06/25/2016  . Cervical facet syndrome 03/15/2016  . Facet syndrome, lumbar 03/15/2016  . DDD (degenerative disc disease), cervical 03/15/2016  . DDD (degenerative disc disease), lumbar 03/15/2016  . Other iron deficiency anemias 11/27/2015  . Recurrent falls 07/25/2015  . Chronic pain syndrome 04/16/2015  . Alcohol abuse 10/01/2014  . 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 BPH (benign prostatic hyperplasia) 11/16/2013  . Enlarged prostate 11/08/2013  . Anemia 11/08/2013  . H/O urinary stone 10/05/2013  . Renal cyst 08/27/2013  .  Diabetes ([place]) 03/21/2013  . Essential hypertension, benign 03/21/2013  . Hyperlipidemia 03/21/2013  . Obesity 03/21/2013    Past Surgical History:  Procedure Laterality Date  . ANTERIOR CERVICAL DECOMP/DISCECTOMY FUSION  01/26/2012   Procedure: ANTERIOR CERVICAL DECOMPRESSION/DISCECTOMY FUSION 3 LEVELS;  Surgeon: Floyce Stakes, MD;  Location: [facility];  Service: Neurosurgery;  Laterality: N/A;  Cervical three-four Cervical four-five Cervical five-six Cervical six-seven , Anterior cervical  decompression/diskectomy, fusion, plate  . ANTERIOR LAT LUMBAR FUSION Right 04/25/2018   Procedure: Right Lumbar two-three Lumbar three-four Lumbar four-five anterior  lateral interbody fusion  with posterior percutaneous pedicle screws;  Surgeon: Erline Levine, MD;  Location: [place];  Service: Neurosurgery;  Laterality: Right;  . APPENDECTOMY    . [place], normal  . CARDIAC CATHETERIZATION     [place]  . COLONOSCOPY WITH PROPOFOL N/A 12/27/2017   Procedure: COLONOSCOPY WITH PROPOFOL;  Surgeon: Toledo, Benay Pike, MD;  Location: [facility] ENDOSCOPY;  Service: Gastroenterology;  Laterality: N/A;  . ESOPHAGOGASTRODUODENOSCOPY (EGD) WITH PROPOFOL N/A 12/27/2017   Procedure: ESOPHAGOGASTRODUODENOSCOPY (EGD) WITH PROPOFOL;  Surgeon: Toledo, Benay Pike, MD;  Location: [facility] ENDOSCOPY;  Service: Gastroenterology;  Laterality: N/A;  . GASTRIC BYPASS  2010   [place]  . HERNIA REPAIR  2000   hiatal  . JOINT REPLACEMENT    . KNEE ARTHROSCOPY     bilateral, left x 2  . LUMBAR PERCUTANEOUS PEDICLE SCREW 3 LEVEL N/A 04/25/2018   Procedure: LUMBAR PERCUTANEOUS PEDICLE SCREW 3 LEVEL;  Surgeon: Erline Levine, MD;  Location: [place];  Service: Neurosurgery;  Laterality: N/A;  . NASAL SINUS SURGERY     x5  . OSTEOTOMY  2001   left  . TONSILLECTOMY    . TOTAL KNEE ARTHROPLASTY Left 05/2014   Dr. Marry Guan  . UVULOPALATOPLASTY  2011    Prior to Admission medications   Medication Sig Start Date End Date Taking? Authorizing Provider  acetaminophen (TYLENOL) 500 MG tablet Take 1,000 mg by mouth 3 (three) times daily as needed for moderate pain or headache.     [provider]  amLODipine (NORVASC) 10 MG tablet Take 1 tablet (10 mg total) by mouth daily. 05/01/19   [name], MD  blood glucose meter kit and supplies KIT Dispense based on patient and insurance preference. Check once daily. ICD doing E11.9. 06/30/16   [name], MD  diphenhydrAMINE  (BENADRYL) 25 MG tablet Take 25 mg by mouth daily as needed for allergies.    [provider]  doxycycline (VIBRA-TABS) 100 MG tablet Take 1 tablet (100 mg total) by mouth 2 (two) times daily. 05/22/19   [name], MD  DULoxetine (CYMBALTA) 60 MG capsule TAKE 1 CAPSULE BY MOUTH EVERY DAY 06/12/19   [name], MD  esomeprazole (NEXIUM) 40 MG capsule TAKE 1 CAPSULE BY MOUTH ONCE DAILY 06/12/19   [name], MD  furosemide (LASIX) 20 MG tablet TAKE ONE TABLET BY MOUTH EVERY DAY 06/12/19   [name], MD  HYDROcodone-acetaminophen (NORCO/VICODIN) 5-325 MG tablet Take 1 tablet by mouth every 6 (six) hours as needed for moderate pain. 11/06/19   Fisher, Linden Dolin, PA-C  metoprolol succinate (TOPROL-XL) 50 MG 24 hr tablet TAKE 1 TABLET BY MOUTH DAILY WITH OR IMMEDIATLY FOLLOWING A MEAL 09/13/19   [name], MD  Multiple Vitamin (MULTIVITAMIN) capsule Take 1 capsule by mouth daily. With copper and zinc 07/11/18   [name], MD  nystatin cream (MYCOSTATIN) Apply 1 application topically 2 (two) times daily. Patient taking differently: Apply 1 application topically 2 (two) times daily as needed (dermatitis).  05/03/17   [name], MD  nystatin-triamcinolone ointment [facility]) Apply 1 application topically 2 (two) times daily. 05/08/18   [name], MD  primidone (MYSOLINE) 50 MG tablet TAKE 1/2 TABLET AT BEDTIME FOR 7 DAYS THEN INCREASE TO TAKE ONE TABLET AT BEDTIME 10/23/19   Tomi Likens, Adam R, DO  rosuvastatin (CRESTOR) 40 MG tablet TAKE ONE TABLET EVERY DAY 10/05/19   Wellington Hampshire, MD  sucralfate (CARAFATE) 1 g tablet TAKE ONE TABLET BY MOUTH FOUR TIMES DAILY 06/12/19   [name], MD  tamsulosin (FLOMAX) 0.4 MG CAPS capsule Take 1 capsule (0.4 mg total) by mouth daily. 02/27/19   [name], MD  TRADJENTA 5 MG TABS tablet TAKE ONE TABLET BY MOUTH EVERY DAY 09/13/19   [name], MD  traMADol (ULTRAM) 50 MG tablet Take by  mouth every 6 (six) hours as needed. Take every 6 hours ( 4 times daily) as needed for pain    [provider]  Vitamin D, Ergocalciferol, (DRISDOL) 1.25 MG (50000 UT) CAPS capsule TAKE 1 CAPSULE EVERY 7 DAYS 01/29/19   [name], MD  Zoledronic Acid (RECLAST IV) Inject into the vein. annually    [provider]    Allergies Altace [ramipril], Levaquin [levofloxacin in d5w], Metformin, Levofloxacin, Trulicity [dulaglutide], Conray [iothalamate], and Lyrica [pregabalin]  Family History  Problem Relation Age of Onset  . Dementia Mother 32  . Osteoporosis Mother   . Heart disease Father 62  . Diabetes Father   . Lymphoma Sister        lymphoma, stage 4  . Ovarian cancer Sister 32       Ovarian  . Lupus Sister   . Prostate cancer Maternal Uncle   . Skin cancer Maternal Uncle   . Tongue cancer Maternal Uncle        uncle died of heart attack  . Alcoholism Maternal Uncle   . Lung cancer Paternal Aunt        pat aunts x 4 died of lung cancer  . Lung cancer Paternal Aunt   . Lung cancer Paternal Aunt   . Lung cancer Paternal Aunt   . Mesothelioma Cousin        paternal cousin    Social History Social History   Tobacco Use  . Smoking status: Never Smoker  . Smokeless tobacco: Never Used  Substance Use Topics  . Alcohol use: No  . Drug use: No    Review of Systems  Constitutional: No fever/chills Eyes: No visual changes. ENT: No sore throat. Respiratory: Denies cough Cardiovascular: Denies chest pain Gastrointestinal: Denies abdominal pain Genitourinary: Negative for dysuria. Musculoskeletal: Positive for back pain, positive for lower extremity swelling Skin: Negative for rash. Psychiatric: no mood changes,     ____________________________________________   PHYSICAL EXAM:  VITAL SIGNS: ED Triage Vitals  Enc Vitals Group     BP 11/06/19 1332 116/61     Pulse Rate 11/06/19 1332 70     Resp 11/06/19 1332 20     Temp 11/06/19 1332 98.1  F (36.7 C)     Temp Source 11/06/19 1332 Oral     SpO2 11/06/19 1332 98 %     Weight 11/06/19 1332 191 lb (86.6 kg)     Height 11/06/19 1332 5' 5.5" (1.664 m)     Head  Circumference --      Peak Flow --      Pain Score 11/06/19 1335 10     Pain Loc --      Pain Edu? --      Excl. in [place]? --     Constitutional: Alert and oriented. Well appearing and in no acute distress. Eyes: Conjunctivae are normal.  Head: Atraumatic. Nose: No congestion/rhinnorhea. Mouth/Throat: Mucous membranes are moist.   Neck:  supple no lymphadenopathy noted Cardiovascular: Normal rate, regular rhythm. Heart sounds are normal Respiratory: Normal respiratory effort.  No retractions, lungs c t a  Abd: soft nontender bs normal all 4 quad GU: deferred Musculoskeletal: T-spine and L-spine are both tender to palpation, lower extremities have nonpitting edema noted bilaterally  neurologic:  Normal speech and language.  Skin:  Skin is warm, dry and intact. No rash noted. Psychiatric: Mood and affect are normal. Speech and behavior are normal.  ____________________________________________   LABS (all labs ordered are listed, but only abnormal results are displayed)  Labs Reviewed  COMPREHENSIVE METABOLIC PANEL - Abnormal; Notable for the following components:      Result Value   Glucose, Bld 139 (*)    Creatinine, Ser 1.37 (*)    GFR calc non Af Amer 53 (*)    All other components within normal limits  CBC WITH DIFFERENTIAL/PLATELET - Abnormal; Notable for the following components:   RBC 3.96 (*)    Hemoglobin 12.9 (*)    Platelets 145 (*)    All other components within normal limits  URINALYSIS, COMPLETE (UACMP) WITH MICROSCOPIC - Abnormal; Notable for the following components:   Color, Urine AMBER (*)    APPearance HAZY (*)    Protein, ur 30 (*)    All other components within normal limits  BRAIN NATRIURETIC PEPTIDE  TROPONIN I (HIGH SENSITIVITY)  TROPONIN I (HIGH SENSITIVITY)    ____________________________________________   ____________________________________________  RADIOLOGY  CT of the thoracic and lumbar spine shows an acute compression fracture of l1 Chest x-ray is negative  ____________________________________________   PROCEDURES  Procedure(s) performed: EKG nsr  Procedures    ____________________________________________   INITIAL IMPRESSION / ASSESSMENT AND PLAN / ED COURSE  Pertinent labs & imaging results that were available during my care of the patient were reviewed by me and considered in my medical decision making (see chart for details).   Patient 69 year old male who is in poor health presents emergency department after a fall 2 weeks ago complaining of low back pain.  Patient also complaining of swelling in both lower extremities which started yesterday.  Patient states he is unsure if lying down to sleep would bother him as he is not been able to sleep and lay down since he hurt his back 2 weeks ago.  See HPI  Physical exam shows patient to be stable.  Vitals are normal.t spine and l spine tender to palpation, lower extremities with swelling bilaterally.  Unremarkable  DDx: CHF, lumbar or T-spine fracture, edema, metabolic abnormality  CT of the T-spine and lumbar spine Chest x-ray CBC, metabolic panel, troponin, BNP, UA, EKG ordered    ----------------------------------------- 3:15 PM on 11/06/2019 ----------------------------------------- Labs are reassuring and are stable for the patient.  No evidence of CHF noted with the BNP being normal and kidney functions are stable.  CT of the thoracic and lumbar spine shows L1 compression fracture with 6 mm protrusion into the spinal canal  Chest x-ray is normal  I left a message for Dr. Lacinda Axon to  call me back.  Dr. Lacinda Axon coming to the ED to see the patient.  Dr. Lacinda Axon and see the patient.  He said we need to order a T SLO brace for the patient.  The patient will follow up with his  regular surgeon in chronic pain management.  At this time we are still awaiting the brace.  Patient was given a prescription for Vicodin.  Explained to him he needs to let his regular physician know that we had given him this pain medication as tramadol is his normal chronic pain medicine.  He is to apply ice to the lower back.  Follow-up with Dr. Vertell Limber who is his regular spine doctor.  Return if needed  ----------------------------------------- 5:59 PM on 11/06/2019 -----------------------------------------  Back brace has been applied by the representative.  Patient is discharged stable condition.  Justin Patel was evaluated in Emergency Department on 11/06/2019 for the symptoms described in the history of present illness. He was evaluated in the context of the global COVID-19 pandemic, which necessitated consideration that the patient might be at risk for infection with the SARS-CoV-2 virus that causes COVID-19. Institutional protocols and algorithms that pertain to the evaluation of patients at risk for COVID-19 are in a state of rapid change based on information released by regulatory bodies including the CDC and federal and state organizations. These policies and algorithms were followed during the patient's care in the ED.   As part of my medical decision making, I reviewed the following data within the [place] notes reviewed and incorporated, Labs reviewed , EKG interpreted NSR, Old chart reviewed, Radiograph reviewed , A consult was requested and obtained from this/these consultant(s) Neurosurgery, Notes from prior ED visits and [place] Controlled Substance Database  ____________________________________________   FINAL CLINICAL IMPRESSION(S) / ED DIAGNOSES  Final diagnoses:  Compression fracture of L1 lumbar vertebra, closed, initial encounter ([place])  Edema leg      NEW MEDICATIONS STARTED DURING THIS VISIT:  New Prescriptions    HYDROCODONE-ACETAMINOPHEN (NORCO/VICODIN) 5-325 MG TABLET    Take 1 tablet by mouth every 6 (six) hours as needed for moderate pain.     Note:  This document was prepared using Dragon voice recognition software and may include unintentional dictation errors.    Versie Starks, PA-C 11/06/19 1713    Versie Starks, PA-C 11/06/19 1800    Harvest Dark, MD 11/07/19 1359

## 2019-11-06 NOTE — ED Triage Notes (Signed)
Patient reports falling 2 weeks ago and since he has had back pain. Reports history of lumbar fusion and cervical fusion. Patient also reports hx of osteoporosis.

## 2019-11-06 NOTE — Progress Notes (Signed)
Orthopedic Tech Progress Note Patient Details:  Justin Patel 09/22/1950 [id number]  Patient ID: [name], male   DOB: 10-26-1950, 69 y.o.   MRN: [id number]   [place] Pink 11/06/2019, 4:36 PMRouted TLSO BRACE ORDER TO HANGER.

## 2019-11-06 NOTE — ED Notes (Signed)
Back brace applied by vendor person.  Dinner tray given to pt

## 2019-11-06 NOTE — ED Notes (Signed)
Pt trx to CT.  

## 2019-11-06 NOTE — Discharge Instructions (Signed)
Follow-up with your regular spine doctor.  Please call for an appointment.  Apply ice to the lower back.  Return if worsening

## 2019-11-06 NOTE — ED Notes (Signed)
Family at bedside. 

## 2019-11-14 DIAGNOSIS — M5416 Radiculopathy, lumbar region: Secondary | ICD-10-CM | POA: Diagnosis not present

## 2019-11-14 DIAGNOSIS — M4126 Other idiopathic scoliosis, lumbar region: Secondary | ICD-10-CM | POA: Diagnosis not present

## 2019-11-14 DIAGNOSIS — S32010A Wedge compression fracture of first lumbar vertebra, initial encounter for closed fracture: Secondary | ICD-10-CM | POA: Diagnosis not present

## 2019-11-20 ENCOUNTER — Other Ambulatory Visit: Payer: Self-pay | Admitting: Neurology

## 2019-11-20 ENCOUNTER — Other Ambulatory Visit: Payer: Self-pay | Admitting: Family Medicine

## 2019-11-22 DIAGNOSIS — M810 Age-related osteoporosis without current pathological fracture: Secondary | ICD-10-CM | POA: Diagnosis not present

## 2019-12-04 DIAGNOSIS — L299 Pruritus, unspecified: Secondary | ICD-10-CM

## 2019-12-04 DIAGNOSIS — M47816 Spondylosis without myelopathy or radiculopathy, lumbar region: Secondary | ICD-10-CM | POA: Diagnosis not present

## 2019-12-04 DIAGNOSIS — M4302 Spondylolysis, cervical region: Secondary | ICD-10-CM | POA: Diagnosis not present

## 2019-12-04 DIAGNOSIS — M8000XS Age-related osteoporosis with current pathological fracture, unspecified site, sequela: Secondary | ICD-10-CM | POA: Diagnosis not present

## 2019-12-04 HISTORY — DX: Pruritus, unspecified: L29.9

## 2019-12-11 ENCOUNTER — Other Ambulatory Visit: Payer: Self-pay | Admitting: Family Medicine

## 2019-12-11 DIAGNOSIS — R600 Localized edema: Secondary | ICD-10-CM

## 2019-12-11 DIAGNOSIS — I10 Essential (primary) hypertension: Secondary | ICD-10-CM

## 2019-12-17 DIAGNOSIS — S32010A Wedge compression fracture of first lumbar vertebra, initial encounter for closed fracture: Secondary | ICD-10-CM | POA: Diagnosis not present

## 2019-12-17 DIAGNOSIS — I10 Essential (primary) hypertension: Secondary | ICD-10-CM | POA: Diagnosis not present

## 2019-12-17 DIAGNOSIS — Z6831 Body mass index (BMI) 31.0-31.9, adult: Secondary | ICD-10-CM | POA: Diagnosis not present

## 2020-01-01 DIAGNOSIS — H6980 Other specified disorders of Eustachian tube, unspecified ear: Secondary | ICD-10-CM | POA: Diagnosis not present

## 2020-01-01 DIAGNOSIS — J324 Chronic pansinusitis: Secondary | ICD-10-CM | POA: Diagnosis not present

## 2020-01-09 DIAGNOSIS — S32010A Wedge compression fracture of first lumbar vertebra, initial encounter for closed fracture: Secondary | ICD-10-CM | POA: Diagnosis not present

## 2020-01-09 DIAGNOSIS — M48061 Spinal stenosis, lumbar region without neurogenic claudication: Secondary | ICD-10-CM | POA: Diagnosis not present

## 2020-01-09 DIAGNOSIS — M5416 Radiculopathy, lumbar region: Secondary | ICD-10-CM | POA: Diagnosis not present

## 2020-01-09 DIAGNOSIS — M4126 Other idiopathic scoliosis, lumbar region: Secondary | ICD-10-CM | POA: Diagnosis not present

## 2020-01-10 ENCOUNTER — Other Ambulatory Visit: Payer: Self-pay | Admitting: Neurosurgery

## 2020-01-11 ENCOUNTER — Encounter (HOSPITAL_COMMUNITY): Payer: Self-pay | Admitting: Neurosurgery

## 2020-01-11 ENCOUNTER — Other Ambulatory Visit: Payer: Self-pay

## 2020-01-11 ENCOUNTER — Other Ambulatory Visit (HOSPITAL_COMMUNITY)
Admission: RE | Admit: 2020-01-11 | Discharge: 2020-01-11 | Disposition: A | Discharge status: Home / Self Care | Payer: Medicare PPO | Source: Ambulatory Visit | Attending: Neurosurgery | Admitting: Neurosurgery

## 2020-01-11 DIAGNOSIS — I10 Essential (primary) hypertension: Secondary | ICD-10-CM | POA: Diagnosis present

## 2020-01-11 DIAGNOSIS — M48061 Spinal stenosis, lumbar region without neurogenic claudication: Secondary | ICD-10-CM | POA: Diagnosis present

## 2020-01-11 DIAGNOSIS — S0081XA Abrasion of other part of head, initial encounter: Secondary | ICD-10-CM | POA: Diagnosis present

## 2020-01-11 DIAGNOSIS — M4126 Other idiopathic scoliosis, lumbar region: Secondary | ICD-10-CM | POA: Diagnosis present

## 2020-01-11 DIAGNOSIS — Z888 Allergy status to other drugs, medicaments and biological substances status: Secondary | ICD-10-CM | POA: Diagnosis not present

## 2020-01-11 DIAGNOSIS — M40205 Unspecified kyphosis, thoracolumbar region: Secondary | ICD-10-CM | POA: Diagnosis not present

## 2020-01-11 DIAGNOSIS — Z20822 Contact with and (suspected) exposure to covid-19: Secondary | ICD-10-CM | POA: Diagnosis present

## 2020-01-11 DIAGNOSIS — W19XXXA Unspecified fall, initial encounter: Secondary | ICD-10-CM | POA: Diagnosis present

## 2020-01-11 DIAGNOSIS — S32010A Wedge compression fracture of first lumbar vertebra, initial encounter for closed fracture: Secondary | ICD-10-CM | POA: Diagnosis present

## 2020-01-11 DIAGNOSIS — M5416 Radiculopathy, lumbar region: Secondary | ICD-10-CM | POA: Diagnosis present

## 2020-01-11 DIAGNOSIS — E119 Type 2 diabetes mellitus without complications: Secondary | ICD-10-CM | POA: Diagnosis present

## 2020-01-11 DIAGNOSIS — M5116 Intervertebral disc disorders with radiculopathy, lumbar region: Secondary | ICD-10-CM | POA: Diagnosis not present

## 2020-01-11 DIAGNOSIS — Z01812 Encounter for preprocedural laboratory examination: Secondary | ICD-10-CM | POA: Insufficient documentation

## 2020-01-11 DIAGNOSIS — G4730 Sleep apnea, unspecified: Secondary | ICD-10-CM | POA: Diagnosis present

## 2020-01-11 DIAGNOSIS — S32020A Wedge compression fracture of second lumbar vertebra, initial encounter for closed fracture: Secondary | ICD-10-CM | POA: Diagnosis not present

## 2020-01-11 DIAGNOSIS — K219 Gastro-esophageal reflux disease without esophagitis: Secondary | ICD-10-CM | POA: Diagnosis present

## 2020-01-11 DIAGNOSIS — Z981 Arthrodesis status: Secondary | ICD-10-CM | POA: Diagnosis not present

## 2020-01-11 DIAGNOSIS — M5384 Other specified dorsopathies, thoracic region: Secondary | ICD-10-CM | POA: Diagnosis not present

## 2020-01-11 DIAGNOSIS — Z881 Allergy status to other antibiotic agents status: Secondary | ICD-10-CM | POA: Diagnosis not present

## 2020-01-11 DIAGNOSIS — Z9884 Bariatric surgery status: Secondary | ICD-10-CM | POA: Diagnosis not present

## 2020-01-11 NOTE — Anesthesia Preprocedure Evaluation (Addendum)
Anesthesia Evaluation  Patient identified by MRN, date of birth, ID band Patient awake    Reviewed: Allergy & Precautions, NPO status , Patient's Chart, lab work & pertinent test results, reviewed documented beta blocker date and time   History of Anesthesia Complications Negative for: history of anesthetic complications  Airway Mallampati: III  TM Distance: >3 FB Neck ROM: Full    Dental  (+) Dental Advisory Given   Pulmonary sleep apnea (improved with weight loss, and UPPP) , Patient abstained from smoking.,  01/11/2020 SARS coronavirus NEG   breath sounds clear to auscultation       Cardiovascular hypertension, Pt. on medications and Pt. on home beta blockers  Rhythm:Regular Rate:Normal  '20 ECHO: EF 55-60%, valves OK   Neuro/Psych  Headaches, Anxiety Depression    GI/Hepatic GERD  Controlled,(+)     substance abuse  ,   Endo/Other  diabetes (glu 148), Oral Hypoglycemic Agents  Renal/GU Renal InsufficiencyRenal disease     Musculoskeletal  (+) Arthritis , narcotic dependent  Abdominal   Peds  Hematology negative hematology ROS (+)   Anesthesia Other Findings   Reproductive/Obstetrics                          Anesthesia Physical Anesthesia Plan  ASA: III  Anesthesia Plan: General   Post-op Pain Management:    Induction:   PONV Risk Score and Plan: 2 and Ondansetron and Dexamethasone  Airway Management Planned: Oral ETT  Additional Equipment: None  Intra-op Plan:   Post-operative Plan: Extubation in OR  Informed Consent: I have reviewed the patients History and Physical, chart, labs and discussed the procedure including the risks, benefits and alternatives for the proposed anesthesia with the patient or authorized representative who has indicated his/her understanding and acceptance.     Dental advisory given  Plan Discussed with: CRNA and Surgeon  Anesthesia Plan  Comments: (PAT note by Karoline Caldwell, PA-C: Recent cardiac eval for exertional fatigue and diastolic CHF. Echo June 2020 showed normal EF with no significant valvular abnormalities. Last seen by Dr. Fletcher Anon 09/13/19. Doing well at that tie, no chest pain or worsening dyspnea. Recommended one year followup.   Follows with neurologist Dr. Tomi Likens for hx of transient loss of consciousness/awareness, possible seizures. Episodes started in 2010 after gastric bypass when he suffered postop bleed resulting in syncope. In 2015 he fell and developed a subdural bleed (he was on lovenox s/p recent TKA). CT of the head performed on 06/05/14 showed interval improvement in the bleed.  He had a 24 hour ambulatory EEG, which was normal but also did not capture one of his habitual spells. Per pt, his fall in 2015 ultimately felt due to orthostatic hypotension. He was previously on antiepileptic medication but was taken off due to intolerance as well as no recurrence of events.. No recent episodes per note 07/31/19.  Dr. Tomi Likens also follows him for LE muscle weakness of unknown etiology. Extensive workup has been unrevealing.   He is on primidone for benign essential tremor.   Hx of morbid obesity s/p gastric bypass.   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 He was referred to the [facility] Department. Patient attempted treatment 11/2017, but nausea was too severe and it was stopped. He CXR did not show concerning findings. He was advised to monitor for symptoms. Prior to lumber surgery in September 2019, Myra Gianotti, PA-C spoke with [facility] Infection Prevention who confirmed that a patient with  latent TB without symptoms concerning for active TB and up-to-date CXR would not require any other special precautions during his hospital stay. CXR 11/06/19 did not show any evidence of pulmonary disease.   Hx of C3-7 ACDF.  Will need DOS labs and eval.   EKG 11/06/19: NSR. Rate 68.  PORTABLE CHEST 1 VIEW  11/06/19: COMPARISON:  08/20/2016  FINDINGS: The heart size is mildly enlarged. There is no pneumothorax or large pleural effusion. Multiple old healed left-sided rib fractures are noted. There is no focal infiltrate. No acute osseous abnormality. There is an intra-articular loose body within the left glenohumeral joint space.  IMPRESSION: No active disease.  TTE 01/30/19: 1. The left ventricle has normal systolic function, with an ejection  fraction of 55-60%. The cavity size was normal. There is mildly increased  left ventricular wall thickness. Left ventricular diastolic Doppler  parameters are consistent with impaired  relaxation.  2. The right ventricle has normal systolic function. The cavity was  normal. There is no increase in right ventricular wall thickness. Right  ventricular systolic pressure could not be assessed.  3. The tricuspid valve is grossly normal.  4. The aortic valve is tricuspid.  5. The aortic root and ascending aorta are normal in size and structure.  6. The interatrial septum was not well visualized. )      Anesthesia Quick Evaluation

## 2020-01-11 NOTE — Progress Notes (Signed)
Anesthesia Chart Review: Same day workup  Recent cardiac eval for exertional fatigue and diastolic CHF. Echo June 2020 showed normal EF with no significant valvular abnormalities. Last seen by Dr. Fletcher Anon 09/13/19. Doing well at that tie, no chest pain or worsening dyspnea. Recommended one year followup.   Follows with neurologist Dr. Tomi Likens for hx of transient loss of consciousness/awareness, possible seizures. Episodes started in 2010 after gastric bypass when he suffered postop bleed resulting in syncope. In 2015 he fell and developed a subdural bleed (he was on lovenox s/p recent TKA). CT of the head performed on 06/05/14 showed interval improvement in the bleed.  He had a 24 hour ambulatory EEG, which was normal but also did not capture one of his habitual spells. Per pt, his fall in 2015 ultimately felt due to orthostatic hypotension. He was previously on antiepileptic medication but was taken off due to intolerance as well as no recurrence of events.. No recent episodes per note 07/31/19.  Dr. Tomi Likens also follows him for LE muscle weakness of unknown etiology. Extensive workup has been unrevealing.   He is on primidone for benign essential tremor.   Hx of morbid obesity s/p gastric bypass.   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 He was referred to the [facility] Department. Patient attempted treatment 11/2017, but nausea was too severe and it was stopped. He CXR did not show concerning findings. He was advised to monitor for symptoms. 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 CXR 11/06/19 did not show any evidence of pulmonary disease.   Hx of C3-7 ACDF.  Will need DOS labs and eval.   EKG 11/06/19: NSR. Rate 68.  PORTABLE CHEST 1  VIEW 11/06/19: COMPARISON:  08/20/2016  FINDINGS: The heart size is mildly enlarged. There is no pneumothorax or large pleural effusion. Multiple old healed left-sided rib fractures are noted. There is no focal infiltrate. No acute osseous abnormality. There is an intra-articular loose body within the left glenohumeral joint space.  IMPRESSION: No active disease.  TTE 01/30/19: 1. The left ventricle has normal systolic function, with an ejection  fraction of 55-60%. The cavity size was normal. There is mildly increased  left ventricular wall thickness. Left ventricular diastolic Doppler  parameters are consistent with impaired  relaxation.  2. The right ventricle has normal systolic function. The cavity was  normal. There is no increase in right ventricular wall thickness. Right  ventricular systolic pressure could not be assessed.  3. The tricuspid valve is grossly normal.  4. The aortic valve is tricuspid.  5. The aortic root and ascending aorta are normal in size and structure.  6. The interatrial septum was not well visualized.   Wynonia Musty [facility] Short Stay Center/Anesthesiology Phone [phone] 01/11/2020 2:54 PM

## 2020-01-11 NOTE — Progress Notes (Signed)
SDW - Kona Lover  Your procedure is scheduled on Monday June 7.  Report to [facility] Main Entrance "A" at 11:30 A.M., and check in at the Admitting office.   Call this number if you have problems the morning of surgery: [phone]  Remember: Do not eat or drink after midnight the night before your surgery  Take these medicines the morning of surgery with A SIP OF WATER: amLODipine (NORVASC)  DULoxetine (CYMBALTA) esomeprazole (NEXIUM)  metoprolol succinate (TOPROL-XL)  rosuvastatin (CRESTOR)  sucralfate (CARAFATE) As of today, STOP taking any Aspirin (unless otherwise instructed by your surgeon), Aleve, Naproxen, Ibuprofen, Motrin, Advil, Goody's, BC's, all herbal medications, fish oil, and all vitamins.  DIABETIC MEDICATIONS  . THE DAY BEFORE SURGERY - TRADJENTA - take as usual   . THE MORNING OF SURGERY - TRADJENTA - do not take - NO oral diabetic medications morning of surgery   How do I manage my blood sugar before surgery? . Check your blood sugar at least 4 times a day, starting 2 days before surgery, to make sure that the level is not too high or low. . Check your blood sugar the morning of your surgery when you wake up and every 2 hours until you get to the Short Stay unit. o If your blood sugar is less than 70 mg/dL, you will need to treat for low blood sugar: - Do not take insulin. - Treat a low blood sugar (less than 70 mg/dL) with  cup of clear juice (cranberry or apple), 4 glucose tablets, OR glucose gel. - Recheck blood sugar in 15 minutes after treatment (to make sure it is greater than 70 mg/dL). If your blood sugar is not greater than 70 mg/dL on recheck, call [phone] for further instructions. . Report your blood sugar to the short stay nurse when you get to Short Stay.  . If you are admitted to the hospital after surgery: o Your blood sugar will be checked by the staff and you will probably be given insulin after surgery (instead of oral diabetes  medicines) to make sure you have good blood sugar levels. o The goal for blood sugar control after surgery is 80-180 mg/dL.     The Morning of Surgery  Do not wear jewelry  Do not wear lotions, powders, colognes, or deodorant Men may shave face and neck.  Do not bring valuables to the hospital.  [facility] is not responsible for any belongings or valuables.  If you are a smoker, DO NOT Smoke 24 hours prior to surgery  If you wear a CPAP at night please bring your mask the morning of surgery   Remember that you must have someone to transport you home after your surgery, and remain with you for 24 hours if you are discharged the same day.   Please bring cases for contacts, glasses, hearing aids, dentures or bridgework because it cannot be worn into surgery.    Leave your suitcase in the car.  After surgery it may be brought to your room.  For patients admitted to the hospital, discharge time will be determined by your treatment team.  Patients discharged the day of surgery will not be allowed to drive home.     Day of Surgery:  Please shower the morning of surgery with the CHG soap Do not apply any deodorants/lotions. Please wear clean clothes to the hospital/surgery center.   Remember to brush your teeth WITH YOUR REGULAR TOOTHPASTE.  Pt verbalized understanding with readback on medications  to take morning of surgery. Pt understands instructions on how to take diabetic medications. No further questions were asked.    PCP - Caryl Bis, MD [facility]) Cardiologist - Fletcher Anon, MD ([facility])  Chest x-ray - n/a EKG - 11/07/19 Stress Test - pt denies ECHO - 01/30/19 Cardiac Cath - pt denies  Sleep Study - yes CPAP - pt used to wear CPAP, but after gastic bypass surgery and weight loss, pt no longer has sleep apnea and denies wearing a CPAP anymore.  Fasting Blood Sugar - 80s Checks Blood Sugar 2x/week Pt has an endocrinologist at [facility] but does not see them,  cannot remember their name, pt states that they are not involved in his diabetic care. Pt states his last A1C was 6.3  Blood Thinner Instructions: n/a Aspirin Instructions: n/a  ERAS Protcol - n/a PRE-SURGERY Ensure or G2- n/a  COVID TEST- 01/11/20   Anesthesia review: cardiac hx  Patient denies shortness of breath, fever, cough and chest pain during phone call

## 2020-01-12 LAB — SARS CORONAVIRUS 2 (TAT 6-24 HRS): SARS Coronavirus 2: NEGATIVE

## 2020-01-14 ENCOUNTER — Inpatient Hospital Stay (HOSPITAL_COMMUNITY)
Admission: RE | Admit: 2020-01-14 | Discharge: 2020-01-16 | DRG: 460 | Disposition: A | Discharge status: Home / Self Care | Payer: Medicare PPO | Attending: Neurosurgery | Admitting: Neurosurgery

## 2020-01-14 ENCOUNTER — Other Ambulatory Visit: Payer: Self-pay

## 2020-01-14 ENCOUNTER — Inpatient Hospital Stay (HOSPITAL_COMMUNITY): Payer: Medicare PPO | Admitting: Physician Assistant

## 2020-01-14 ENCOUNTER — Encounter (HOSPITAL_COMMUNITY): Payer: Self-pay | Admitting: Neurosurgery

## 2020-01-14 ENCOUNTER — Inpatient Hospital Stay (HOSPITAL_COMMUNITY): Payer: Medicare PPO

## 2020-01-14 ENCOUNTER — Encounter (HOSPITAL_COMMUNITY)
Admission: RE | Disposition: A | Discharge status: Home / Self Care | Payer: Self-pay | Source: Home / Self Care | Attending: Neurosurgery

## 2020-01-14 DIAGNOSIS — G4730 Sleep apnea, unspecified: Secondary | ICD-10-CM | POA: Diagnosis present

## 2020-01-14 DIAGNOSIS — S32019A Unspecified fracture of first lumbar vertebra, initial encounter for closed fracture: Secondary | ICD-10-CM | POA: Diagnosis present

## 2020-01-14 DIAGNOSIS — M5416 Radiculopathy, lumbar region: Secondary | ICD-10-CM | POA: Diagnosis present

## 2020-01-14 DIAGNOSIS — K219 Gastro-esophageal reflux disease without esophagitis: Secondary | ICD-10-CM | POA: Diagnosis present

## 2020-01-14 DIAGNOSIS — W19XXXA Unspecified fall, initial encounter: Secondary | ICD-10-CM | POA: Diagnosis present

## 2020-01-14 DIAGNOSIS — Z888 Allergy status to other drugs, medicaments and biological substances status: Secondary | ICD-10-CM | POA: Diagnosis not present

## 2020-01-14 DIAGNOSIS — M48061 Spinal stenosis, lumbar region without neurogenic claudication: Secondary | ICD-10-CM | POA: Diagnosis present

## 2020-01-14 DIAGNOSIS — Z20822 Contact with and (suspected) exposure to covid-19: Secondary | ICD-10-CM | POA: Diagnosis present

## 2020-01-14 DIAGNOSIS — Z9884 Bariatric surgery status: Secondary | ICD-10-CM

## 2020-01-14 DIAGNOSIS — E119 Type 2 diabetes mellitus without complications: Secondary | ICD-10-CM | POA: Diagnosis present

## 2020-01-14 DIAGNOSIS — Z881 Allergy status to other antibiotic agents status: Secondary | ICD-10-CM | POA: Diagnosis not present

## 2020-01-14 DIAGNOSIS — S32010A Wedge compression fracture of first lumbar vertebra, initial encounter for closed fracture: Secondary | ICD-10-CM | POA: Diagnosis present

## 2020-01-14 DIAGNOSIS — M4126 Other idiopathic scoliosis, lumbar region: Secondary | ICD-10-CM | POA: Diagnosis present

## 2020-01-14 DIAGNOSIS — I10 Essential (primary) hypertension: Secondary | ICD-10-CM | POA: Diagnosis present

## 2020-01-14 DIAGNOSIS — Z981 Arthrodesis status: Secondary | ICD-10-CM | POA: Diagnosis not present

## 2020-01-14 DIAGNOSIS — M5384 Other specified dorsopathies, thoracic region: Secondary | ICD-10-CM | POA: Diagnosis not present

## 2020-01-14 DIAGNOSIS — S0081XA Abrasion of other part of head, initial encounter: Secondary | ICD-10-CM | POA: Diagnosis present

## 2020-01-14 DIAGNOSIS — Z419 Encounter for procedure for purposes other than remedying health state, unspecified: Secondary | ICD-10-CM

## 2020-01-14 HISTORY — DX: Unspecified fracture of first lumbar vertebra, initial encounter for closed fracture: S32.019A

## 2020-01-14 HISTORY — PX: LAMINECTOMY WITH POSTERIOR LATERAL ARTHRODESIS LEVEL 4: SHX6338

## 2020-01-14 LAB — CBC
HCT: 44.3 % (ref 39.0–52.0)
Hemoglobin: 14.3 g/dL (ref 13.0–17.0)
MCH: 31.9 pg (ref 26.0–34.0)
MCHC: 32.3 g/dL (ref 30.0–36.0)
MCV: 98.9 fL (ref 80.0–100.0)
Platelets: 221 10*3/uL (ref 150–400)
RBC: 4.48 MIL/uL (ref 4.22–5.81)
RDW: 14.1 % (ref 11.5–15.5)
WBC: 7.2 10*3/uL (ref 4.0–10.5)
nRBC: 0 % (ref 0.0–0.2)

## 2020-01-14 LAB — TYPE AND SCREEN
ABO/RH(D): AB POS
Antibody Screen: NEGATIVE

## 2020-01-14 LAB — BASIC METABOLIC PANEL
Anion gap: 9 (ref 5–15)
BUN: 12 mg/dL (ref 8–23)
CO2: 26 mmol/L (ref 22–32)
Calcium: 9.1 mg/dL (ref 8.9–10.3)
Chloride: 103 mmol/L (ref 98–111)
Creatinine, Ser: 1.15 mg/dL (ref 0.61–1.24)
GFR calc Af Amer: >60 mL/min (ref >60)
GFR calc non Af Amer: >60 mL/min (ref >60)
Glucose, Bld: 148 mg/dL — ABNORMAL HIGH (ref 70–99)
Potassium: 3.8 mmol/L (ref 3.5–5.1)
Sodium: 138 mmol/L (ref 135–145)

## 2020-01-14 LAB — GLUCOSE, CAPILLARY
Glucose-Capillary: 148 mg/dL — ABNORMAL HIGH (ref 70–99)
Glucose-Capillary: 109 mg/dL — ABNORMAL HIGH (ref 70–99)
Glucose-Capillary: 149 mg/dL — ABNORMAL HIGH (ref 70–99)
Glucose-Capillary: 169 mg/dL — ABNORMAL HIGH (ref 70–99)
Glucose-Capillary: 245 mg/dL — ABNORMAL HIGH (ref 70–99)

## 2020-01-14 LAB — SURGICAL PCR SCREEN
MRSA, PCR: NEGATIVE
Staphylococcus aureus: POSITIVE — AB

## 2020-01-14 LAB — HEMOGLOBIN A1C
Hgb A1c MFr Bld: 7 % — ABNORMAL HIGH (ref 4.8–5.6)
Mean Plasma Glucose: 154.2 mg/dL

## 2020-01-14 SURGERY — LAMINECTOMY WITH POSTERIOR LATERAL ARTHRODESIS LEVEL 4
Anesthesia: General | Site: Spine Lumbar

## 2020-01-14 MED ORDER — MIDAZOLAM HCL 5 MG/5ML IJ SOLN
INTRAMUSCULAR | Status: DC | PRN
  Administered 2020-01-14: 2 mg via INTRAVENOUS

## 2020-01-14 MED ORDER — ACETAMINOPHEN 325 MG PO TABS
650.0000 mg | ORAL_TABLET | ORAL | Status: DC | PRN
  Administered 2020-01-14 – 2020-01-15 (×2): 650 mg via ORAL
  Filled 2020-01-14 (×2): qty 2

## 2020-01-14 MED ORDER — LACTATED RINGERS IV SOLN: INTRAVENOUS | Status: DC

## 2020-01-14 MED ORDER — BUPIVACAINE HCL (PF) 0.5 % IJ SOLN
INTRAMUSCULAR | Status: AC
  Filled 2020-01-14: qty 30

## 2020-01-14 MED ORDER — HYDROCODONE-ACETAMINOPHEN 5-325 MG PO TABS
2.0000 | ORAL_TABLET | ORAL | Status: DC | PRN
  Administered 2020-01-14 – 2020-01-15 (×3): 2 via ORAL
  Filled 2020-01-14 (×2): qty 2

## 2020-01-14 MED ORDER — SODIUM CHLORIDE (PF) 0.9 % IJ SOLN
INTRAMUSCULAR | Status: DC | PRN
  Administered 2020-01-14: 20 mL via INTRAVENOUS

## 2020-01-14 MED ORDER — OXYCODONE HCL 5 MG PO TABS
ORAL_TABLET | ORAL | Status: AC
  Filled 2020-01-14: qty 1

## 2020-01-14 MED ORDER — BUPIVACAINE LIPOSOME 1.3 % IJ SUSP
INTRAMUSCULAR | Status: DC | PRN
  Administered 2020-01-14: 20 mL

## 2020-01-14 MED ORDER — PANTOPRAZOLE SODIUM 40 MG PO TBEC: 40.0000 mg | DELAYED_RELEASE_TABLET | Freq: Every day | ORAL | Status: DC

## 2020-01-14 MED ORDER — ACETAMINOPHEN 650 MG RE SUPP: 650.0000 mg | RECTAL | Status: DC | PRN

## 2020-01-14 MED ORDER — PHENYLEPHRINE 40 MCG/ML (10ML) SYRINGE FOR IV PUSH (FOR BLOOD PRESSURE SUPPORT)
PREFILLED_SYRINGE | INTRAVENOUS | Status: DC | PRN
  Administered 2020-01-14: 120 ug via INTRAVENOUS

## 2020-01-14 MED ORDER — MIDAZOLAM HCL 2 MG/2ML IJ SOLN
INTRAMUSCULAR | Status: AC
  Filled 2020-01-14: qty 2

## 2020-01-14 MED ORDER — ROSUVASTATIN CALCIUM 20 MG PO TABS
40.0000 mg | ORAL_TABLET | Freq: Every day | ORAL | Status: DC
  Administered 2020-01-14 – 2020-01-15 (×2): 40 mg via ORAL
  Filled 2020-01-14 (×2): qty 2

## 2020-01-14 MED ORDER — PHENOL 1.4 % MT LIQD: 1.0000 | OROMUCOSAL | Status: DC | PRN

## 2020-01-14 MED ORDER — CALCIUM CARBONATE-VITAMIN D 500-200 MG-UNIT PO TABS
1.0000 | ORAL_TABLET | Freq: Every day | ORAL | Status: DC
  Administered 2020-01-15 – 2020-01-16 (×2): 1 via ORAL
  Filled 2020-01-14 (×2): qty 1

## 2020-01-14 MED ORDER — INSULIN ASPART 100 UNIT/ML ~~LOC~~ SOLN
0.0000 [IU] | Freq: Every day | SUBCUTANEOUS | Status: DC
  Administered 2020-01-14: 2 [IU] via SUBCUTANEOUS

## 2020-01-14 MED ORDER — FLEET ENEMA 7-19 GM/118ML RE ENEM: 1.0000 | ENEMA | Freq: Once | RECTAL | Status: DC | PRN

## 2020-01-14 MED ORDER — INSULIN ASPART 100 UNIT/ML ~~LOC~~ SOLN
SUBCUTANEOUS | Status: AC
  Administered 2020-01-14: 4 [IU] via SUBCUTANEOUS
  Filled 2020-01-14: qty 1

## 2020-01-14 MED ORDER — PROMETHAZINE HCL 25 MG/ML IJ SOLN: 6.2500 mg | INTRAMUSCULAR | Status: DC | PRN

## 2020-01-14 MED ORDER — SUCRALFATE 1 G PO TABS
1.0000 g | ORAL_TABLET | Freq: Four times a day (QID) | ORAL | Status: DC
  Administered 2020-01-14 – 2020-01-16 (×6): 1 g via ORAL
  Filled 2020-01-14 (×9): qty 1

## 2020-01-14 MED ORDER — CHLORHEXIDINE GLUCONATE CLOTH 2 % EX PADS: 6.0000 | MEDICATED_PAD | Freq: Once | CUTANEOUS | Status: DC

## 2020-01-14 MED ORDER — LIDOCAINE 2% (20 MG/ML) 5 ML SYRINGE
INTRAMUSCULAR | Status: DC | PRN
  Administered 2020-01-14: 20 mg via INTRAVENOUS

## 2020-01-14 MED ORDER — KCL IN DEXTROSE-NACL 20-5-0.45 MEQ/L-%-% IV SOLN: INTRAVENOUS | Status: DC

## 2020-01-14 MED ORDER — FENTANYL CITRATE (PF) 250 MCG/5ML IJ SOLN
INTRAMUSCULAR | Status: AC
  Filled 2020-01-14: qty 5

## 2020-01-14 MED ORDER — SUGAMMADEX SODIUM 200 MG/2ML IV SOLN
INTRAVENOUS | Status: DC | PRN
  Administered 2020-01-14: 200 mg via INTRAVENOUS

## 2020-01-14 MED ORDER — EPHEDRINE 5 MG/ML INJ
INTRAVENOUS | Status: AC
  Filled 2020-01-14: qty 10

## 2020-01-14 MED ORDER — MENTHOL 3 MG MT LOZG: 1.0000 | LOZENGE | OROMUCOSAL | Status: DC | PRN

## 2020-01-14 MED ORDER — ZOLPIDEM TARTRATE 5 MG PO TABS: 5.0000 mg | ORAL_TABLET | Freq: Every evening | ORAL | Status: DC | PRN

## 2020-01-14 MED ORDER — SODIUM CHLORIDE 0.9% FLUSH: 3.0000 mL | INTRAVENOUS | Status: DC | PRN

## 2020-01-14 MED ORDER — ONDANSETRON HCL 4 MG/2ML IJ SOLN
INTRAMUSCULAR | Status: DC | PRN
  Administered 2020-01-14: 4 mg via INTRAVENOUS

## 2020-01-14 MED ORDER — SODIUM CHLORIDE 0.9% FLUSH
3.0000 mL | Freq: Two times a day (BID) | INTRAVENOUS | Status: DC
  Administered 2020-01-14: 3 mL via INTRAVENOUS

## 2020-01-14 MED ORDER — THROMBIN 5000 UNITS EX SOLR
OROMUCOSAL | Status: DC | PRN
  Administered 2020-01-14: 5 mL

## 2020-01-14 MED ORDER — PANTOPRAZOLE SODIUM 40 MG PO TBEC
40.0000 mg | DELAYED_RELEASE_TABLET | Freq: Every day | ORAL | Status: DC
  Administered 2020-01-14 – 2020-01-15 (×2): 40 mg via ORAL
  Filled 2020-01-14 (×2): qty 1

## 2020-01-14 MED ORDER — DULOXETINE HCL 30 MG PO CPEP
60.0000 mg | ORAL_CAPSULE | Freq: Every day | ORAL | Status: DC
  Administered 2020-01-15 – 2020-01-16 (×2): 60 mg via ORAL
  Filled 2020-01-14 (×2): qty 2

## 2020-01-14 MED ORDER — HYDROMORPHONE HCL 1 MG/ML IJ SOLN
INTRAMUSCULAR | Status: AC
  Filled 2020-01-14: qty 1

## 2020-01-14 MED ORDER — PHENYLEPHRINE HCL-NACL 10-0.9 MG/250ML-% IV SOLN
INTRAVENOUS | Status: DC | PRN
  Administered 2020-01-14: 75 ug/min via INTRAVENOUS

## 2020-01-14 MED ORDER — CALCIUM CARBONATE-VITAMIN D 600-400 MG-UNIT PO CHEW: CHEWABLE_TABLET | Freq: Every day | ORAL | Status: DC

## 2020-01-14 MED ORDER — OXYCODONE HCL 5 MG/5ML PO SOLN: 5.0000 mg | Freq: Once | ORAL | Status: AC | PRN

## 2020-01-14 MED ORDER — METOPROLOL SUCCINATE ER 50 MG PO TB24
50.0000 mg | ORAL_TABLET | Freq: Every day | ORAL | Status: DC
  Administered 2020-01-15 – 2020-01-16 (×2): 50 mg via ORAL
  Filled 2020-01-14 (×2): qty 1

## 2020-01-14 MED ORDER — DOCUSATE SODIUM 100 MG PO CAPS
100.0000 mg | ORAL_CAPSULE | Freq: Two times a day (BID) | ORAL | Status: DC
  Administered 2020-01-14 – 2020-01-16 (×4): 100 mg via ORAL
  Filled 2020-01-14 (×4): qty 1

## 2020-01-14 MED ORDER — FENTANYL CITRATE (PF) 250 MCG/5ML IJ SOLN
INTRAMUSCULAR | Status: DC | PRN
  Administered 2020-01-14: 50 ug via INTRAVENOUS
  Administered 2020-01-14: 200 ug via INTRAVENOUS
  Administered 2020-01-14: 50 ug via INTRAVENOUS
  Administered 2020-01-14: 100 ug via INTRAVENOUS
  Administered 2020-01-14 (×2): 50 ug via INTRAVENOUS

## 2020-01-14 MED ORDER — BUPIVACAINE HCL (PF) 0.5 % IJ SOLN
INTRAMUSCULAR | Status: DC | PRN
  Administered 2020-01-14: 7 mL

## 2020-01-14 MED ORDER — LINAGLIPTIN 5 MG PO TABS
5.0000 mg | ORAL_TABLET | Freq: Every day | ORAL | Status: DC
  Administered 2020-01-15: 5 mg via ORAL
  Filled 2020-01-14 (×2): qty 1

## 2020-01-14 MED ORDER — METHOCARBAMOL 500 MG PO TABS
500.0000 mg | ORAL_TABLET | Freq: Four times a day (QID) | ORAL | Status: DC | PRN
  Administered 2020-01-14 – 2020-01-16 (×6): 500 mg via ORAL
  Filled 2020-01-14 (×6): qty 1

## 2020-01-14 MED ORDER — AMLODIPINE BESYLATE 5 MG PO TABS
10.0000 mg | ORAL_TABLET | Freq: Every day | ORAL | Status: DC
  Administered 2020-01-15 – 2020-01-16 (×2): 10 mg via ORAL
  Filled 2020-01-14 (×2): qty 2

## 2020-01-14 MED ORDER — PRIMIDONE 50 MG PO TABS
50.0000 mg | ORAL_TABLET | Freq: Every day | ORAL | Status: DC
  Administered 2020-01-14 – 2020-01-15 (×2): 50 mg via ORAL
  Filled 2020-01-14 (×3): qty 1

## 2020-01-14 MED ORDER — CHLORHEXIDINE GLUCONATE 0.12 % MT SOLN: 15.0000 mL | Freq: Once | OROMUCOSAL | Status: DC

## 2020-01-14 MED ORDER — OXYCODONE HCL 5 MG PO TABS
5.0000 mg | ORAL_TABLET | Freq: Once | ORAL | Status: AC | PRN
  Administered 2020-01-14: 5 mg via ORAL

## 2020-01-14 MED ORDER — POLYETHYLENE GLYCOL 3350 17 G PO PACK
17.0000 g | PACK | Freq: Every day | ORAL | Status: DC | PRN
  Administered 2020-01-15: 17 g via ORAL
  Filled 2020-01-14: qty 1

## 2020-01-14 MED ORDER — ONDANSETRON HCL 4 MG PO TABS: 4.0000 mg | ORAL_TABLET | Freq: Four times a day (QID) | ORAL | Status: DC | PRN

## 2020-01-14 MED ORDER — HYDROMORPHONE HCL 1 MG/ML IJ SOLN
0.5000 mg | INTRAMUSCULAR | Status: DC | PRN
  Administered 2020-01-14 – 2020-01-15 (×2): 0.5 mg via INTRAVENOUS
  Filled 2020-01-14 (×2): qty 0.5

## 2020-01-14 MED ORDER — PROPOFOL 10 MG/ML IV BOLUS
INTRAVENOUS | Status: DC | PRN
  Administered 2020-01-14: 150 mg via INTRAVENOUS

## 2020-01-14 MED ORDER — HYDROCODONE-ACETAMINOPHEN 5-325 MG PO TABS
ORAL_TABLET | ORAL | Status: AC
  Filled 2020-01-14: qty 2

## 2020-01-14 MED ORDER — LIDOCAINE-EPINEPHRINE 1 %-1:100000 IJ SOLN
INTRAMUSCULAR | Status: AC
  Filled 2020-01-14: qty 1

## 2020-01-14 MED ORDER — ROCURONIUM BROMIDE 10 MG/ML (PF) SYRINGE
PREFILLED_SYRINGE | INTRAVENOUS | Status: AC
  Filled 2020-01-14: qty 20

## 2020-01-14 MED ORDER — TAMSULOSIN HCL 0.4 MG PO CAPS
0.4000 mg | ORAL_CAPSULE | Freq: Every day | ORAL | Status: DC
  Administered 2020-01-14 – 2020-01-16 (×3): 0.4 mg via ORAL
  Filled 2020-01-14 (×3): qty 1

## 2020-01-14 MED ORDER — THROMBIN 5000 UNITS EX SOLR
CUTANEOUS | Status: AC
  Filled 2020-01-14: qty 5000

## 2020-01-14 MED ORDER — CEFAZOLIN SODIUM-DEXTROSE 2-4 GM/100ML-% IV SOLN
2.0000 g | Freq: Three times a day (TID) | INTRAVENOUS | Status: AC
  Administered 2020-01-14 – 2020-01-15 (×2): 2 g via INTRAVENOUS
  Filled 2020-01-14 (×2): qty 100

## 2020-01-14 MED ORDER — PROPOFOL 10 MG/ML IV BOLUS
INTRAVENOUS | Status: AC
  Filled 2020-01-14: qty 20

## 2020-01-14 MED ORDER — METHOCARBAMOL 1000 MG/10ML IJ SOLN
500.0000 mg | Freq: Four times a day (QID) | INTRAVENOUS | Status: DC | PRN
  Filled 2020-01-14: qty 5

## 2020-01-14 MED ORDER — MEPERIDINE HCL 25 MG/ML IJ SOLN: 6.2500 mg | INTRAMUSCULAR | Status: DC | PRN

## 2020-01-14 MED ORDER — MIDAZOLAM HCL 2 MG/2ML IJ SOLN: 0.5000 mg | Freq: Once | INTRAMUSCULAR | Status: DC | PRN

## 2020-01-14 MED ORDER — DEXAMETHASONE SODIUM PHOSPHATE 10 MG/ML IJ SOLN
INTRAMUSCULAR | Status: AC
  Filled 2020-01-14: qty 3

## 2020-01-14 MED ORDER — PHENYLEPHRINE 40 MCG/ML (10ML) SYRINGE FOR IV PUSH (FOR BLOOD PRESSURE SUPPORT)
PREFILLED_SYRINGE | INTRAVENOUS | Status: AC
  Filled 2020-01-14: qty 10

## 2020-01-14 MED ORDER — BUPIVACAINE LIPOSOME 1.3 % IJ SUSP
20.0000 mL | Freq: Once | INTRAMUSCULAR | Status: DC
  Filled 2020-01-14: qty 20

## 2020-01-14 MED ORDER — FUROSEMIDE 20 MG PO TABS
20.0000 mg | ORAL_TABLET | Freq: Every day | ORAL | Status: DC
  Administered 2020-01-14 – 2020-01-16 (×3): 20 mg via ORAL
  Filled 2020-01-14 (×3): qty 1

## 2020-01-14 MED ORDER — ALUM & MAG HYDROXIDE-SIMETH 200-200-20 MG/5ML PO SUSP: 30.0000 mL | Freq: Four times a day (QID) | ORAL | Status: DC | PRN

## 2020-01-14 MED ORDER — LIDOCAINE-EPINEPHRINE 1 %-1:100000 IJ SOLN
INTRAMUSCULAR | Status: DC | PRN
  Administered 2020-01-14: 7 mL

## 2020-01-14 MED ORDER — ONDANSETRON HCL 4 MG/2ML IJ SOLN: 4.0000 mg | Freq: Four times a day (QID) | INTRAMUSCULAR | Status: DC | PRN

## 2020-01-14 MED ORDER — CEFAZOLIN SODIUM-DEXTROSE 2-4 GM/100ML-% IV SOLN
2.0000 g | INTRAVENOUS | Status: AC
  Administered 2020-01-14: 2 g via INTRAVENOUS

## 2020-01-14 MED ORDER — LIDOCAINE 2% (20 MG/ML) 5 ML SYRINGE
INTRAMUSCULAR | Status: AC
  Filled 2020-01-14: qty 5

## 2020-01-14 MED ORDER — SODIUM CHLORIDE 0.9 % IV SOLN: 250.0000 mL | INTRAVENOUS | Status: DC

## 2020-01-14 MED ORDER — VITAMIN D 25 MCG (1000 UNIT) PO TABS
4000.0000 [IU] | ORAL_TABLET | Freq: Every day | ORAL | Status: DC
  Administered 2020-01-14 – 2020-01-16 (×3): 4000 [IU] via ORAL
  Filled 2020-01-14 (×3): qty 4

## 2020-01-14 MED ORDER — INSULIN ASPART 100 UNIT/ML ~~LOC~~ SOLN
0.0000 [IU] | Freq: Three times a day (TID) | SUBCUTANEOUS | Status: DC
  Administered 2020-01-14: 3 [IU] via SUBCUTANEOUS
  Administered 2020-01-15: 2 [IU] via SUBCUTANEOUS
  Administered 2020-01-15: 5 [IU] via SUBCUTANEOUS
  Administered 2020-01-15: 2 [IU] via SUBCUTANEOUS
  Filled 2020-01-14: qty 0.15

## 2020-01-14 MED ORDER — PANTOPRAZOLE SODIUM 40 MG IV SOLR: 40.0000 mg | Freq: Every day | INTRAVENOUS | Status: DC

## 2020-01-14 MED ORDER — HYDROCODONE-ACETAMINOPHEN 5-325 MG PO TABS: 1.0000 | ORAL_TABLET | Freq: Four times a day (QID) | ORAL | Status: DC | PRN

## 2020-01-14 MED ORDER — HYDROMORPHONE HCL 1 MG/ML IJ SOLN
0.2500 mg | INTRAMUSCULAR | Status: DC | PRN
  Administered 2020-01-14 (×4): 0.5 mg via INTRAVENOUS

## 2020-01-14 MED ORDER — OXYCODONE HCL 5 MG PO TABS
5.0000 mg | ORAL_TABLET | ORAL | Status: DC | PRN
  Administered 2020-01-14 – 2020-01-15 (×2): 5 mg via ORAL
  Filled 2020-01-14 (×2): qty 1

## 2020-01-14 MED ORDER — EPHEDRINE SULFATE-NACL 50-0.9 MG/10ML-% IV SOSY
PREFILLED_SYRINGE | INTRAVENOUS | Status: DC | PRN
  Administered 2020-01-14: 10 mg via INTRAVENOUS
  Administered 2020-01-14: 15 mg via INTRAVENOUS

## 2020-01-14 MED ORDER — INSULIN ASPART 100 UNIT/ML ~~LOC~~ SOLN
4.0000 [IU] | Freq: Three times a day (TID) | SUBCUTANEOUS | Status: DC
  Administered 2020-01-15 (×3): 4 [IU] via SUBCUTANEOUS
  Filled 2020-01-14: qty 0.04

## 2020-01-14 MED ORDER — ONDANSETRON HCL 4 MG/2ML IJ SOLN
INTRAMUSCULAR | Status: AC
  Filled 2020-01-14: qty 6

## 2020-01-14 MED ORDER — DEXAMETHASONE SODIUM PHOSPHATE 10 MG/ML IJ SOLN
INTRAMUSCULAR | Status: DC | PRN
  Administered 2020-01-14: 10 mg via INTRAVENOUS

## 2020-01-14 MED ORDER — ORAL CARE MOUTH RINSE: 15.0000 mL | Freq: Once | OROMUCOSAL | Status: DC

## 2020-01-14 MED ORDER — BISACODYL 10 MG RE SUPP: 10.0000 mg | Freq: Every day | RECTAL | Status: DC | PRN

## 2020-01-14 MED ORDER — ROCURONIUM BROMIDE 10 MG/ML (PF) SYRINGE
PREFILLED_SYRINGE | INTRAVENOUS | Status: DC | PRN
  Administered 2020-01-14: 20 mg via INTRAVENOUS
  Administered 2020-01-14: 60 mg via INTRAVENOUS

## 2020-01-14 SURGICAL SUPPLY — 83 items
BASKET BONE COLLECTION (BASKET) ×3 IMPLANT
BLADE CLIPPER SURG (BLADE) IMPLANT
BONE CANC CHIPS 40CC CAN1/2 (Bone Implant) ×3 IMPLANT
BUR MATCHSTICK NEURO 3.0 LAGG (BURR) ×3 IMPLANT
BUR PRECISION FLUTE 5.0 (BURR) ×3 IMPLANT
CANISTER SUCT 3000ML PPV (MISCELLANEOUS) ×3 IMPLANT
CARTRIDGE OIL MAESTRO DRILL (MISCELLANEOUS) ×1 IMPLANT
CHIPS CANC BONE 40CC CAN1/2 (Bone Implant) ×1 IMPLANT
CNTNR URN SCR LID CUP LEK RST (MISCELLANEOUS) ×1 IMPLANT
CONT SPEC 4OZ STRL OR WHT (MISCELLANEOUS) ×6
COVER BACK TABLE 60X90IN (DRAPES) ×3 IMPLANT
COVER WAND RF STERILE (DRAPES) ×1 IMPLANT
DECANTER SPIKE VIAL GLASS SM (MISCELLANEOUS) ×1 IMPLANT
DERMABOND ADVANCED (GAUZE/BANDAGES/DRESSINGS) ×4
DERMABOND ADVANCED .7 DNX12 (GAUZE/BANDAGES/DRESSINGS) ×1 IMPLANT
DIFFUSER DRILL AIR PNEUMATIC (MISCELLANEOUS) ×3 IMPLANT
DRAPE C-ARM 42X72 X-RAY (DRAPES) ×3 IMPLANT
DRAPE C-ARMOR (DRAPES) ×3 IMPLANT
DRAPE LAPAROTOMY 100X72X124 (DRAPES) ×3 IMPLANT
DRAPE SURG 17X23 STRL (DRAPES) ×3 IMPLANT
DRSG OPSITE POSTOP 4X10 (GAUZE/BANDAGES/DRESSINGS) ×2 IMPLANT
DURAPREP 26ML APPLICATOR (WOUND CARE) ×3 IMPLANT
ELECT REM PT RETURN 9FT ADLT (ELECTROSURGICAL) ×3
ELECTRODE REM PT RTRN 9FT ADLT (ELECTROSURGICAL) ×1 IMPLANT
EVACUATOR 1/8 PVC DRAIN (DRAIN) ×2 IMPLANT
GAUZE 4X4 16PLY RFD (DISPOSABLE) ×2 IMPLANT
GAUZE SPONGE 4X4 12PLY STRL (GAUZE/BANDAGES/DRESSINGS) ×3 IMPLANT
GLOVE BIO SURGEON STRL SZ8 (GLOVE) ×6 IMPLANT
GLOVE BIOGEL PI IND STRL 6.5 (GLOVE) IMPLANT
GLOVE BIOGEL PI IND STRL 7.5 (GLOVE) IMPLANT
GLOVE BIOGEL PI IND STRL 8 (GLOVE) ×2 IMPLANT
GLOVE BIOGEL PI IND STRL 8.5 (GLOVE) ×2 IMPLANT
GLOVE BIOGEL PI INDICATOR 6.5 (GLOVE) ×2
GLOVE BIOGEL PI INDICATOR 7.5 (GLOVE) ×2
GLOVE BIOGEL PI INDICATOR 8 (GLOVE) ×2
GLOVE BIOGEL PI INDICATOR 8.5 (GLOVE) ×4
GLOVE ECLIPSE 7.0 STRL STRAW (GLOVE) ×2 IMPLANT
GLOVE ECLIPSE 8.0 STRL XLNG CF (GLOVE) ×4 IMPLANT
GLOVE SURG SS PI 6.5 STRL IVOR (GLOVE) ×2 IMPLANT
GOWN STRL REUS W/ TWL LRG LVL3 (GOWN DISPOSABLE) IMPLANT
GOWN STRL REUS W/ TWL XL LVL3 (GOWN DISPOSABLE) ×2 IMPLANT
GOWN STRL REUS W/TWL 2XL LVL3 (GOWN DISPOSABLE) ×4 IMPLANT
GOWN STRL REUS W/TWL LRG LVL3 (GOWN DISPOSABLE) ×6
GOWN STRL REUS W/TWL XL LVL3 (GOWN DISPOSABLE) ×6
GRAFT BNE CHIP CANC 1-8 40 (Bone Implant) IMPLANT
HEMOSTAT POWDER KIT SURGIFOAM (HEMOSTASIS) ×3 IMPLANT
KIT BASIN OR (CUSTOM PROCEDURE TRAY) ×3 IMPLANT
KIT INFUSE X SMALL 1.4CC (Orthopedic Implant) ×2 IMPLANT
KIT POSITION SURG JACKSON T1 (MISCELLANEOUS) ×3 IMPLANT
KIT TURNOVER KIT B (KITS) ×3 IMPLANT
MILL MEDIUM DISP (BLADE) ×3 IMPLANT
NDL HYPO 21X1.5 SAFETY (NEEDLE) IMPLANT
NDL HYPO 25X1 1.5 SAFETY (NEEDLE) ×1 IMPLANT
NDL SPNL 18GX3.5 QUINCKE PK (NEEDLE) IMPLANT
NEEDLE HYPO 21X1.5 SAFETY (NEEDLE) ×3 IMPLANT
NEEDLE HYPO 25X1 1.5 SAFETY (NEEDLE) ×3 IMPLANT
NEEDLE SPNL 18GX3.5 QUINCKE PK (NEEDLE) ×3 IMPLANT
NS IRRIG 1000ML POUR BTL (IV SOLUTION) ×3 IMPLANT
OIL CARTRIDGE MAESTRO DRILL (MISCELLANEOUS) ×3
PACK LAMINECTOMY NEURO (CUSTOM PROCEDURE TRAY) ×3 IMPLANT
PAD ARMBOARD 7.5X6 YLW CONV (MISCELLANEOUS) ×9 IMPLANT
PATTIES SURGICAL .5 X.5 (GAUZE/BANDAGES/DRESSINGS) IMPLANT
PATTIES SURGICAL .5 X1 (DISPOSABLE) IMPLANT
PATTIES SURGICAL 1X1 (DISPOSABLE) IMPLANT
ROD RELINE 0-0 CON M 5.0/6.0MM (Rod) ×4 IMPLANT
ROD RELINE-O 5.5X300 STRT NS (Rod) IMPLANT
ROD RELINE-O 5.5X300MM STRT (Rod) ×6 IMPLANT
SCREW LOCK RELINE 5.5 TULIP (Screw) ×20 IMPLANT
SCREW RELINE-O POLY 6.5X45 (Screw) ×12 IMPLANT
SPONGE LAP 4X18 RFD (DISPOSABLE) IMPLANT
SPONGE SURGIFOAM ABS GEL 100 (HEMOSTASIS) IMPLANT
STAPLER SKIN PROX WIDE 3.9 (STAPLE) ×2 IMPLANT
SUT VIC AB 1 CT1 18XBRD ANBCTR (SUTURE) ×2 IMPLANT
SUT VIC AB 1 CT1 8-18 (SUTURE) ×6
SUT VIC AB 2-0 CT1 18 (SUTURE) ×6 IMPLANT
SUT VIC AB 3-0 SH 8-18 (SUTURE) ×8 IMPLANT
SYR 20ML LL LF (SYRINGE) ×4 IMPLANT
SYR 5ML LL (SYRINGE) IMPLANT
TOWEL GREEN STERILE (TOWEL DISPOSABLE) ×3 IMPLANT
TOWEL GREEN STERILE FF (TOWEL DISPOSABLE) ×3 IMPLANT
TRAP SPECIMEN MUCUS 40CC (MISCELLANEOUS) ×2 IMPLANT
TRAY FOLEY MTR SLVR 16FR STAT (SET/KITS/TRAYS/PACK) ×3 IMPLANT
WATER STERILE IRR 1000ML POUR (IV SOLUTION) ×3 IMPLANT

## 2020-01-14 NOTE — Interval H&P Note (Signed)
History and Physical Interval Note:  01/14/2020 12:16 PM  Justin Patel  has presented today for surgery, with the diagnosis of Wedge compression fracture of lumbar vertebra.  The various methods of treatment have been discussed with the patient and family. After consideration of risks, benefits and other options for treatment, the patient has consented to  Procedure(s): Decompressive Laminectomy Lumbar 1 with pedicle screw fixation from Thoracic 10 to Lumbar 2 (N/A) as a surgical intervention.  The patient's history has been reviewed, patient examined, no change in status, stable for surgery.  I have reviewed the patient's chart and labs.  Questions were answered to the patient's satisfaction.     Peggyann Shoals

## 2020-01-14 NOTE — Brief Op Note (Signed)
01/14/2020  5:13 PM  PATIENT:  Justin Patel  69 y.o. male  PRE-OPERATIVE DIAGNOSIS:  Wedge compression fracture of lumbar vertebra with retropulsion of bone into spinal canal, lumbar radiculopathy, lumbago, kyphotic deformity  POST-OPERATIVE DIAGNOSIS:  Wedge compression fracture of lumbar vertebra with retropulsion of bone into spinal canal, lumbar radiculopathy, lumbago, kyphotic deformity  PROCEDURE:  Procedure(s) with comments: Decompressive Laminectomy Lumbar One with pedicle screw fixation from Thoracic Ten to Lumbar Two (N/A) - Decompressive Laminectomy Lumbar One with pedicle screw fixation from Thoracic Ten to Lumbar Two with posterolateral arthrodesis with tamping of bone from the spinal canal  SURGEON:  Surgeon(s) and Role:    Erline Levine, MD - Primary    Consuella Lose, MD - Assisting  PHYSICIAN ASSISTANT:   ASSISTANTS: Poteat, RN   Glenford Peers, NP   Jimmye Norman, MS-3   ANESTHESIA:   general  EBL:  150 mL   BLOOD ADMINISTERED:none  DRAINS: (Medium) Hemovact drain(s) in the epidural space with  Suction Open   LOCAL MEDICATIONS USED:  MARCAINE    and LIDOCAINE   SPECIMEN:  No Specimen  DISPOSITION OF SPECIMEN:  N/A  COUNTS:  YES  TOURNIQUET:  * No tourniquets in log *  DICTATION: Patient is 69 year old man with L1 fracture and osteoporosis, status post L 2 - L 5 fusion with progressive kyphosis and stenosis due to retropulsion of bone from L 1 fracture.  He has severe back pain and bilateral lower extremity pain. It was elected to take him to surgery for decompression and fusion at this level.   Procedure: Patient was placed in a prone position on the [place] table after smooth and uncomplicated induction of general endotracheal anesthesia. His low back was prepped and draped in usual sterile fashion with betadine scrub and DuraPrep. Area of incision was infiltrated with local lidocaine. Incision was made to the lumbodorsal fascia was incised and exposure  was performed of the T10-L3 spinous processes laminae facet joint and transverse processes. Intraoperative x-ray was obtained which confirmed correct orientation. Rod connectors were placed  Bilaterally between L 2 and L 3 screws.  A total laminectomy of L1 was performed decompression of the spinal cord at this level and thorough decompression was performed of  nerve roots along with the common dural tube.  The bone fragments were tamped away from the nerves, particularly on the right, which was the patient's more symptomatic side.  The posterolateral region was extensively decorticated and pedicle probes were placed at T10, T11, T 12   bilaterally. Intraoperative fluoroscopy confirmed correct orientationin the AP and lateral plane. 45 x 6.5 mm pedicle screws were placed at T10, T11, T12 bilaterally.  Intraoperative fluroscopy was used during this portion of the procedure. Final x-rays demonstrated well-positioned pedicle screw fixation. There seemed to be some extravasation of bone cement cephalad in the spinal canal, so laminectomy was extended to the level of T11 and the spinal cord and neural elements were confirmed to be decompressed. A 160 mm rod was placed on the right and a 150 mm rod was placed on the left locked down in situ and the posterolateral region was packed with the extra small BMP and autograft and bone allograft. A medium Hemovac drain was placed through a separate incision.  Fascia was closed with 1 Vicryl sutures skin edges were reapproximated 2 and 3-0 Vicryl sutures. The wound is dressed with Dermabond and an occlusive dressing.   the patient was extubated in the operating room and taken to recovery  in stable satisfactory condition he tolerated traction well counts were correct at the end of the case.  PLAN OF CARE: Admit to inpatient   PATIENT DISPOSITION:  PACU - hemodynamically stable.   Delay start of Pharmacological VTE agent (>24hrs) due to surgical blood loss or risk of bleeding:  yes

## 2020-01-14 NOTE — Transfer of Care (Signed)
Immediate Anesthesia Transfer of Care Note  Patient: Justin Patel  Procedure(s) Performed: Decompressive Laminectomy Lumbar One with pedicle screw fixation from Thoracic Ten to Lumbar Two (N/A Spine Lumbar)  Patient Location: PACU  Anesthesia Type:General  Level of Consciousness: awake, alert  and oriented  Airway & Oxygen Therapy: Patient Spontanous Breathing  Post-op Assessment: Report given to RN and Post -op Vital signs reviewed and stable  Post vital signs: Reviewed and stable  Last Vitals:  Vitals Value Taken Time  BP 111/74 01/14/20 1707  Temp 36.6 C 01/14/20 1707  Pulse 91 01/14/20 1713  Resp 11 01/14/20 1713  SpO2 91 % 01/14/20 1713  Vitals shown include unvalidated device data.  Last Pain:  Vitals:   01/14/20 1707  TempSrc:   PainSc: (P) 0-No pain      Patients Stated Pain Goal: 3 (91/50/56 [id number])  Complications: No apparent anesthesia complications

## 2020-01-14 NOTE — Anesthesia Postprocedure Evaluation (Signed)
Anesthesia Post Note  Patient: Justin Patel  Procedure(s) Performed: Decompressive Laminectomy Lumbar One with pedicle screw fixation from Thoracic Ten to Lumbar Two (N/A Spine Lumbar)     Patient location during evaluation: PACU Anesthesia Type: General Level of consciousness: awake and alert, oriented and patient cooperative Pain management: pain level controlled Vital Signs Assessment: post-procedure vital signs reviewed and stable Respiratory status: spontaneous breathing, nonlabored ventilation and respiratory function stable Cardiovascular status: blood pressure returned to baseline and stable Postop Assessment: no apparent nausea or vomiting Anesthetic complications: no    Last Vitals:  Vitals:   01/14/20 1752 01/14/20 1807  BP: (!) 92/55 103/66  Pulse: 85 81  Resp: 20 11  Temp:    SpO2: 93% 95%    Last Pain:  Vitals:   01/14/20 1807  TempSrc:   PainSc: 4                  Lason Eveland,E. Keandrea Tapley

## 2020-01-14 NOTE — Op Note (Signed)
01/14/2020  5:13 PM  PATIENT:  Justin Patel  69 y.o. male  PRE-OPERATIVE DIAGNOSIS:  Wedge compression fracture of lumbar vertebra with retropulsion of bone into spinal canal, lumbar radiculopathy, lumbago, kyphotic deformity  POST-OPERATIVE DIAGNOSIS:  Wedge compression fracture of lumbar vertebra with retropulsion of bone into spinal canal, lumbar radiculopathy, lumbago, kyphotic deformity  PROCEDURE:  Procedure(s) with comments: Decompressive Laminectomy Lumbar One with pedicle screw fixation from Thoracic Ten to Lumbar Two (N/A) - Decompressive Laminectomy Lumbar One with pedicle screw fixation from Thoracic Ten to Lumbar Two with posterolateral arthrodesis with tamping of bone from the spinal canal  SURGEON:  Surgeon(s) and Role:    Erline Levine, MD - Primary    Consuella Lose, MD - Assisting  PHYSICIAN ASSISTANT:   ASSISTANTS: Poteat, RN   Glenford Peers, NP   Jimmye Norman, MS-3   ANESTHESIA:   general  EBL:  150 mL   BLOOD ADMINISTERED:none  DRAINS: (Medium) Hemovact drain(s) in the epidural space with  Suction Open   LOCAL MEDICATIONS USED:  MARCAINE    and LIDOCAINE   SPECIMEN:  No Specimen  DISPOSITION OF SPECIMEN:  N/A  COUNTS:  YES  TOURNIQUET:  * No tourniquets in log *  DICTATION: Patient is 69 year old man with L1 fracture and osteoporosis, status post L 2 - L 5 fusion with progressive kyphosis and stenosis due to retropulsion of bone from L 1 fracture.  He has severe back pain and bilateral lower extremity pain. It was elected to take him to surgery for decompression and fusion at this level.   Procedure: Patient was placed in a prone position on the [place] table after smooth and uncomplicated induction of general endotracheal anesthesia. His low back was prepped and draped in usual sterile fashion with betadine scrub and DuraPrep. Area of incision was infiltrated with local lidocaine. Incision was made to the lumbodorsal fascia was incised and exposure  was performed of the T10-L3 spinous processes laminae facet joint and transverse processes. Intraoperative x-ray was obtained which confirmed correct orientation. Rod connectors were placed  Bilaterally between L 2 and L 3 screws.  A total laminectomy of L1 was performed decompression of the spinal cord at this level and thorough decompression was performed of  nerve roots along with the common dural tube.  The bone fragments were tamped away from the nerves, particularly on the right, which was the patient's more symptomatic side.  The posterolateral region was extensively decorticated and pedicle probes were placed at T10, T11, T 12   bilaterally. Intraoperative fluoroscopy confirmed correct orientationin the AP and lateral plane. 45 x 6.5 mm pedicle screws were placed at T10, T11, T12 bilaterally.  Intraoperative fluroscopy was used during this portion of the procedure. Final x-rays demonstrated well-positioned pedicle screw fixation. There seemed to be some extravasation of bone cement cephalad in the spinal canal, so laminectomy was extended to the level of T11 and the spinal cord and neural elements were confirmed to be decompressed. A 160 mm rod was placed on the right and a 150 mm rod was placed on the left locked down in situ and the posterolateral region was packed with the extra small BMP and autograft and bone allograft. A medium Hemovac drain was placed through a separate incision.  Fascia was closed with 1 Vicryl sutures skin edges were reapproximated 2 and 3-0 Vicryl sutures. The wound is dressed with Dermabond and an occlusive dressing.   the patient was extubated in the operating room and taken to recovery  in stable satisfactory condition he tolerated traction well counts were correct at the end of the case.  PLAN OF CARE: Admit to inpatient   PATIENT DISPOSITION:  PACU - hemodynamically stable.   Delay start of Pharmacological VTE agent (>24hrs) due to surgical blood loss or risk of bleeding:  yes

## 2020-01-14 NOTE — Anesthesia Procedure Notes (Signed)
Procedure Name: Intubation Date/Time: 01/14/2020 2:06 PM Performed by: Griffin Dakin, CRNA Pre-anesthesia Checklist: Patient identified, Emergency Drugs available, Suction available and Patient being monitored Patient Re-evaluated:Patient Re-evaluated prior to induction Oxygen Delivery Method: Circle system utilized Preoxygenation: Pre-oxygenation with 100% oxygen Induction Type: IV induction Ventilation: Mask ventilation without difficulty Laryngoscope Size: Glidescope and 4 Grade View: Grade I Tube type: Oral Tube size: 7.5 mm Number of attempts: 1 Airway Equipment and Method: Video-laryngoscopy and Rigid stylet Placement Confirmation: ETT inserted through vocal cords under direct vision,  positive ETCO2 and breath sounds checked- equal and bilateral Secured at: 24 cm Tube secured with: Tape Dental Injury: Teeth and Oropharynx as per pre-operative assessment

## 2020-01-14 NOTE — H&P (Signed)
Patient ID:   [id number] Patient: Justin Patel  Date of Birth: [birth year] Visit Type: Office Visit   Date: 01/09/2020 02:45 PM Provider: Marchia Meiers. Vertell Limber MD   This 69 year old male presents for back pain.  HISTORY OF PRESENT ILLNESS:  1.  back pain  Patient returns after calling to report increased lumbar, left buttock, and left hip pain.  Patient is quite anxious and fearful with this visit, forthcoming with his concerns of recent alcohol use in treatment of his pain.  He found no pain relief with tramadol or Norco.  Oxycodone offered some pain relief but he found himself taking too many.  He then flushed all his narcotic pain medications.  Over the last couple days he has begun drinking, suffering a fall and abrasion to his forehead yesterday.  X-ray on canopy    Lumbar radiographs demonstrate progression of L1 fracture.  Review of previous imaging demonstrates retropulsion of bone into the spinal canal.    The patient is describing pain at 7 to 8/10 and says it is unrelenting.  He is getting pain into his left buttock and hip and upper leg.  He describes the pain as unbearable.         Medical/Surgical/Interim History Reviewed, no change.  Last detailed document date:12/20/2016.     PAST MEDICAL HISTORY, SURGICAL HISTORY, FAMILY HISTORY, SOCIAL HISTORY AND REVIEW OF SYSTEMS I have reviewed the patient's past medical, surgical, family and social history as well as the comprehensive review of systems as included on the [facility] history form dated 01/08/2019, which I have signed.  Family History:  Reviewed, no changes.  Last detailed document date:12/20/2016.   Social History: Reviewed, no changes. Last detailed document date: 12/20/2016.    MEDICATIONS: (added, continued or stopped this visit) Started Medication Directions Instruction Stopped  11/21/2019 acetaminophen 500 mg tablet take 1 tablet by oral route  every 6 hours as needed      amlodipine 5 mg tablet take 1 tablet by oral route  every day     cyclobenzaprine 5 mg tablet take 1 tablet by oral route 2 times every day  01/11/2020   duloxetine 60 mg capsule,delayed release take 1 capsule by oral route  every day    06/22/2018 hydrocodone 10 mg-acetaminophen 325 mg tablet take 1 tablet by oral route  every 4 hours as needed for pain    12/10/2019 hydrocodone 5 mg-acetaminophen 325 mg tablet take 1 tablet by oral route  every 6 hours as needed for pain    07/10/2018 hydrocodone 5 mg-ibuprofen 200 mg tablet take 1 tablet by oral route  every 6 hours as needed not to exceed 5 tablets in 24hrs     losartan 100 mg tablet take 1 tablet by oral route  every bedtime    05/22/2018 methocarbamol 500 mg tablet take 1 tablet by oral route 4 times every day     metoprolol succinate ER 50 mg tablet,extended release 24 hr take 1 tablet by oral route  every day     Nexium 40 mg capsule,delayed release take 1 capsule by oral route  every day    12/17/2019 Percocet 5 mg-325 mg tablet take 1 tablet by oral route  every 6 hours as needed for pain     Reclast 5 mg/100 mL intravenous piggyback      rosuvastatin 10 mg tablet take 1 tablet by oral route  every day     sucralfate 1 gram tablet take 1 tablet  by oral route  every day on an empty stomach 1 hour before meals and at bedtime    08/07/2019 tramadol 50 mg tablet take 1 tablet by oral route  every 6 hours as needed DNF09/10/2018       ALLERGIES: Ingredient Reaction Medication Name Comment  METFORMIN     RAMIPRIL  Altace   PREGABALIN  Lyrica    Reviewed, no changes.    PHYSICAL EXAM:   Vitals Date Temp F BP Pulse Ht In Wt Lb BMI BSA Pain Score  01/09/2020  167/88 103 66 188.6 30.44  8/10      IMPRESSION:     Because of the progression of the spinal fracture I have recommended proceeding with posterior decompression L1 level with T10 through L2 pedicle screw fixation with attaching hardware to prior fusion  construct.  PLAN:    The patient wishes to proceed with surgery and we will do so on 01/14/2020 because of the severity of the patient's pain he is  aware of risks and benefits and wishes to proceed  Orders: Diagnostic Procedures: Assessment Procedure  M54.16 Thoraco-lumbar Spine- AP/Lat  Instruction(s)/Education: Assessment Instruction  I10 Lifestyle education  Z68.30 Lifestyle education regarding diet   Completed Orders (this encounter) Order Details Reason Side Interpretation Result Initial Treatment Date Region  Lifestyle education Patient will follow up with Primary care physician.        Lifestyle education regarding diet Encouraged patient to eat well balanced diet.         Assessment/Plan   # Detail Type Description   1. Assessment Idiopathic scoliosis of lumbar region (M41.26).       2. 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       3. Assessment Wedge compression fracture of first lumbar vertebra, init (S32.010A).       4. Assessment Radiculopathy, lumbar region (M54.16).       5. Assessment Essential (primary) hypertension (I10).       6. Assessment Body mass index (BMI) 30.0-30.9, adult (Z68.30).   Plan Orders Today's instructions / counseling include(s) Lifestyle education regarding diet. Clinical information/comments: Encouraged patient to eat well balanced diet.         Pain Management Plan Pain Scale: 8/10. Method: Numeric Pain Intensity Scale. Location: back. Onset: 12/21/2015. Duration: varies. Quality: discomforting. Pain management follow-up plan of care: Patient will continue medication management..              Provider:  Marchia Meiers. Vertell Limber MD  01/13/2020 04:20 PM    Dictation edited by: Marchia Meiers. Vertell Limber    CC Providers: [name] [address] [place],  [place]  [address]-   [name]  [address] [place], [place] [address]-               Electronically signed by Marchia Meiers. Vertell Limber MD on 01/13/2020 04:20 PM

## 2020-01-15 LAB — GLUCOSE, CAPILLARY
Glucose-Capillary: 138 mg/dL — ABNORMAL HIGH (ref 70–99)
Glucose-Capillary: 144 mg/dL — ABNORMAL HIGH (ref 70–99)
Glucose-Capillary: 231 mg/dL — ABNORMAL HIGH (ref 70–99)
Glucose-Capillary: 57 mg/dL — ABNORMAL LOW (ref 70–99)
Glucose-Capillary: 100 mg/dL — ABNORMAL HIGH (ref 70–99)

## 2020-01-15 MED ORDER — HYDROCODONE-ACETAMINOPHEN 7.5-325 MG PO TABS
1.0000 | ORAL_TABLET | ORAL | Status: DC | PRN
  Administered 2020-01-15 – 2020-01-16 (×7): 2 via ORAL
  Filled 2020-01-15 (×7): qty 2

## 2020-01-15 NOTE — Progress Notes (Addendum)
Subjective: Patient reports "I've been better, but I'm doing pretty good"  Objective: Vital signs in last 24 hours: Temp:  [97.8 F (36.6 C)-98.7 F (37.1 C)] 98.4 F (36.9 C) (06/08 0341) Pulse Rate:  [72-90] 72 (06/08 0341) Resp:  [10-20] 18 (06/08 0341) BP: (92-137)/(55-99) 121/71 (06/08 0341) SpO2:  [93 %-100 %] 95 % (06/08 0341) Weight:  [82.1 kg] 82.1 kg (06/07 1116)  Intake/Output from previous day: 06/07 0701 - 06/08 0700 In: 1003 [I.V.:1003] Out: 1060 [Urine:610; Drains:300; Blood:150] Intake/Output this shift: No intake/output data recorded.  Alert, conversant, smiling. Reports appropriate post-op back pain. Good strength BLE. Ambulated in hallway last night. Incision without erythema swelling or drainage beneath honeycomb and Dermabond. Hemovac patent ~164ml since surgery.   Lab Results: Recent Labs    01/14/20 1155  WBC 7.2  HGB 14.3  HCT 44.3  PLT 221   BMET Recent Labs    01/14/20 1155  NA 138  K 3.8  CL 103  CO2 26  GLUCOSE 148*  BUN 12  CREATININE 1.15  CALCIUM 9.1    Studies/Results: DG Lumbar Spine Complete  Result Date: 01/14/2020 CLINICAL DATA:  69 year old male undergoing lumbar surgery. EXAM: LUMBAR SPINE - COMPLETE 4+ VIEW; DG C-ARM 1-60 MIN COMPARISON:  Lumbar spine CT 11/06/2019. FLUOROSCOPY TIME:  0 minutes 57 seconds FINDINGS: Same numbering system as on the March lumbar CT. Four intraoperative fluoroscopic spot views of the lower thoracic spine. Bilateral transpedicular screws are in place, from T10 to T12 judging by the L1 compression fracture visible on the prior CT. Connecting rods not yet in place. Faintly visible chronic L2 fusion hardware. IMPRESSION: Placement of bilateral T10 through T12 pedicle screws. Electronically Signed   By: Genevie Ann M.D.   On: 01/14/2020 18:00   DG C-Arm 1-60 Min  Result Date: 01/14/2020 CLINICAL DATA:  69 year old male undergoing lumbar surgery. EXAM: LUMBAR SPINE - COMPLETE 4+ VIEW; DG C-ARM 1-60 MIN  COMPARISON:  Lumbar spine CT 11/06/2019. FLUOROSCOPY TIME:  0 minutes 57 seconds FINDINGS: Same numbering system as on the March lumbar CT. Four intraoperative fluoroscopic spot views of the lower thoracic spine. Bilateral transpedicular screws are in place, from T10 to T12 judging by the L1 compression fracture visible on the prior CT. Connecting rods not yet in place. Faintly visible chronic L2 fusion hardware. IMPRESSION: Placement of bilateral T10 through T12 pedicle screws. Electronically Signed   By: Genevie Ann M.D.   On: 01/14/2020 18:00    Assessment/Plan: improving  LOS: 1 day  Mobilize today with therapies. May d/c Hemovac later today if drainage decreases (40ml since midnight).    Verdis Prime 01/15/2020, 7:53 AM  Leg and radicular pain resolved.  Patient now has incisional pain, to be expected.  Mobilize with PT.

## 2020-01-15 NOTE — Progress Notes (Signed)
Hypoglycemic Event  CBG:57  Treatment:4 oz regular soda Symptoms:none Follow-up CBG: Time2205 CBG Result100  Possible Reasons for Event :unknown Comments/MD notified: NO   Madriaga-Acosta,Juanpablo Ciresi D

## 2020-01-15 NOTE — Evaluation (Addendum)
Occupational Therapy Evaluation Patient Details Name: Justin Patel MRN: [id number] DOB: 05-Nov-1950 Today's Date: 01/15/2020    History of Present Illness Pt is a 69 y/o male who initially presents with L1 fx, now s/p decompressive Laminectomy L1 with pedicle screw fixation from T10-L2. PMHx includes anxiety, CHF, anemia, DM, CKD, HTN, hx of L TKA, ACDF (2013) and previous lumbar sx 04/2018.    Clinical Impression   This 69 y/o male presents with the above. PTA pt reports independence with ADL and functional mobility. Pt pleasant and willing to participate in therapy session today, mostly with limitations due to post-op pain at this time. Pt requiring minguard-minA for LB ADL, setup assist for seated UB ADL and minguard assist for functional transfers/room level mobility without AD. Reviewed and further educated re: back precautions, brace management and overall safety and compensatory strategies for performing ADL and functional taks while adhering to precautions. Pt verbalizing understanding and with good carryover throughout functional tasks today. He reports plans to return home with intermittent assist from his spouse. Pt to benefit from continued acute OT services to maximize his overall safety and independence with ADL and mobility prior to return home; do not anticipate pt will require additional OT services after discharge.     Follow Up Recommendations  No OT follow up;Supervision - Intermittent    Equipment Recommendations  None recommended by OT           Precautions / Restrictions Precautions Precautions: Fall;Back Precaution Comments: verbally reviewed 3/3 back precautions, pt with good recall from previous surgeries  Required Braces or Orthoses: Spinal Brace Spinal Brace: Applied in sitting position Restrictions Weight Bearing Restrictions: No      Mobility Bed Mobility Overal bed mobility: Needs Assistance Bed Mobility: Rolling;Sidelying to Sit Rolling:  Supervision Sidelying to sit: Supervision       General bed mobility comments: requires cues to initiate/utilize log roll technique, use of bed rail, once cued pt able to perform without assist. reports he/spouse have adjustable bed at home   Transfers Overall transfer level: Needs assistance Equipment used: None Transfers: Sit to/from Stand Sit to Stand: Supervision;Min guard         General transfer comment: for initial balance and safety    Balance Overall balance assessment: Mild deficits observed, not formally tested                                         ADL either performed or assessed with clinical judgement   ADL Overall ADL's : Needs assistance/impaired Eating/Feeding: Modified independent;Sitting   Grooming: Wash/dry hands;Supervision/safety;Standing Grooming Details (indicate cue type and reason): standing at sink in bathroom Upper Body Bathing: Set up;Supervision/ safety;Sitting   Lower Body Bathing: Min guard;Sit to/from stand   Upper Body Dressing : Set up;Supervision/safety;Sitting Upper Body Dressing Details (indicate cue type and reason): pt donning TLSO seated EOB Lower Body Dressing: Min guard;Sit to/from stand Lower Body Dressing Details (indicate cue type and reason): pt using figure 4 to don socks - educated to limit this position if feeling increased pulling/pain at incision site  Toilet Transfer: Min guard;Ambulation   Toileting- Clothing Manipulation and Hygiene: Supervision/safety;Sit to/from stand Toileting - Clothing Manipulation Details (indicate cue type and reason): pt standing to void bladder      Functional mobility during ADLs: Min guard  Pertinent Vitals/Pain Pain Assessment: Faces Faces Pain Scale: Hurts little more Pain Location: back, incisional Pain Descriptors / Indicators: Discomfort;Guarding;Sore Pain Intervention(s): Monitored during session;RN gave pain meds during  session;Repositioned     Hand Dominance     Extremity/Trunk Assessment Upper Extremity Assessment Upper Extremity Assessment: Overall WFL for tasks assessed   Lower Extremity Assessment Lower Extremity Assessment: Defer to PT evaluation   Cervical / Trunk Assessment Cervical / Trunk Assessment: Other exceptions Cervical / Trunk Exceptions: s/p lumbar surgery   Communication Communication Communication: No difficulties   Cognition Arousal/Alertness: Awake/alert Behavior During Therapy: WFL for tasks assessed/performed Overall Cognitive Status: Within Functional Limits for tasks assessed                                     General Comments  incision unobserved     Exercises     Shoulder Instructions      Home Living Family/patient expects to be discharged to:: Private residence Living Arrangements: Spouse/significant other Available Help at Discharge: Family;Available PRN/intermittently Type of Home: House Home Access: Stairs to enter [employer] of Steps: 1 in garage, 2-3 at front Entrance Stairs-Rails: Right;Left(rails at front) Home Layout: One level     Bathroom Shower/Tub: Occupational psychologist: Handicapped height     Home Equipment: Shower seat - built in;Grab bars - tub/shower          Prior Functioning/Environment Level of Independence: Independent                 OT Problem List: Decreased strength;Decreased range of motion;Decreased activity tolerance;Decreased knowledge of use of DME or AE;Decreased knowledge of precautions;Pain;Impaired balance (sitting and/or standing)      OT Treatment/Interventions: Self-care/ADL training;Therapeutic exercise;Energy conservation;DME and/or AE instruction;Therapeutic activities;Cognitive remediation/compensation;Patient/family education;Balance training    OT Goals(Current goals can be found in the care plan section) Acute Rehab OT Goals Patient Stated Goal: home  when able OT Goal Formulation: With patient Time For Goal Achievement: 01/29/20 Potential to Achieve Goals: Good  OT Frequency: Min 2X/week   Barriers to D/C:            Co-evaluation              AM-PAC OT "6 Clicks" Daily Activity     Outcome Measure Help from another person eating meals?: None Help from another person taking care of personal grooming?: A Little Help from another person toileting, which includes using toliet, bedpan, or urinal?: A Little Help from another person bathing (including washing, rinsing, drying)?: A Little Help from another person to put on and taking off regular upper body clothing?: A Little Help from another person to put on and taking off regular lower body clothing?: A Little 6 Click Score: 19   End of Session Equipment Utilized During Treatment: Gait belt;Back brace Nurse Communication: Mobility status  Activity Tolerance: Patient tolerated treatment well Patient left: in chair;with call bell/phone within reach  OT Visit Diagnosis: Pain;Other abnormalities of gait and mobility (R26.89) Pain - part of body: (back)                Time: 1761-6073 OT Time Calculation (min): 29 min Charges:  OT General Charges $OT Visit: 1 Visit OT Evaluation $OT Eval Low Complexity: 1 Low OT Treatments $Self Care/Home Management : 8-22 mins  Lou Cal, OT Acute Rehabilitation Services Pager [phone] Office [phone]   Raymondo Band 01/15/2020, 9:47 AM

## 2020-01-15 NOTE — Evaluation (Signed)
Physical Therapy Evaluation Patient Details Name: Justin Patel MRN: [id number] DOB: Feb 10, 1951 Today's Date: 01/15/2020   History of Present Illness  Pt is a 69 y/o male who initially presents with L1 fx, now s/p decompressive Laminectomy L1 with pedicle screw fixation from T10-L2. PMHx includes anxiety, CHF, anemia, DM, CKD, HTN, hx of L TKA, ACDF (2013) and previous lumbar sx 04/2018.   Clinical Impression  Pt admitted with above diagnosis. At the time of PT eval, pt was able to demonstrate transfers and ambulation with gross supervision for safety and no AD. TLSO was donned incorrectly upon PT entry, and increased time required for brace adjustment and education. Pt was also educated on precautions, brace application/wearing schedule, appropriate activity progression, and car transfer. Pt currently with functional limitations due to the deficits listed below (see PT Problem List). Pt will benefit from skilled PT to increase their independence and safety with mobility to allow discharge to the venue listed below.      Follow Up Recommendations No PT follow up;Supervision for mobility/OOB    Equipment Recommendations  None recommended by PT    Recommendations for Other Services       Precautions / Restrictions Precautions Precautions: Fall;Back Precaution Comments: verbally reviewed 3/3 back precautions, pt with good recall from previous surgeries  Required Braces or Orthoses: Spinal Brace Spinal Brace: Thoracolumbosacral orthotic;Applied in sitting position Restrictions Weight Bearing Restrictions: No      Mobility  Bed Mobility               General bed mobility comments: Pt was received sitting up in recliner.   Transfers Overall transfer level: Needs assistance Equipment used: None Transfers: Sit to/from Stand Sit to Stand: Supervision         General transfer comment: for initial balance and safety  Ambulation/Gait Ambulation/Gait assistance:  Supervision Gait Distance (Feet): 300 Feet Assistive device: None Gait Pattern/deviations: Step-through pattern;Decreased stride length;Leaning posteriorly Gait velocity: Decreased Gait velocity interpretation: <1.8 ft/sec, indicate of risk for recurrent falls General Gait Details: Pt with increased lumbar lordosis and posterior lean. No LOB noted.   Stairs            Wheelchair Mobility    Modified Rankin (Stroke Patients Only)       Balance Overall balance assessment: Mild deficits observed, not formally tested                                           Pertinent Vitals/Pain Pain Assessment: Faces Faces Pain Scale: Hurts little more Pain Location: back, incisional Pain Descriptors / Indicators: Discomfort;Guarding;Sore Pain Intervention(s): Limited activity within patient's tolerance;Monitored during session;Repositioned    Home Living Family/patient expects to be discharged to:: Private residence Living Arrangements: Spouse/significant other Available Help at Discharge: Family;Available PRN/intermittently Type of Home: House Home Access: Stairs to enter Entrance Stairs-Rails: Right;Left(rails at front) Technical brewer of Steps: 1 in garage, 2-3 at front Home Layout: One level Home Equipment: Shower seat - built in;Grab bars - tub/shower      Prior Function Level of Independence: Independent               Hand Dominance   Dominant Hand: Right    Extremity/Trunk Assessment   Upper Extremity Assessment Upper Extremity Assessment: Defer to OT evaluation    Lower Extremity Assessment Lower Extremity Assessment: Generalized weakness    Cervical / Trunk Assessment Cervical /  Trunk Assessment: Other exceptions Cervical / Trunk Exceptions: s/p lumbar surgery  Communication   Communication: No difficulties  Cognition Arousal/Alertness: Awake/alert Behavior During Therapy: WFL for tasks assessed/performed Overall Cognitive  Status: Within Functional Limits for tasks assessed                                        General Comments      Exercises     Assessment/Plan    PT Assessment Patient needs continued PT services  PT Problem List Decreased strength;Decreased activity tolerance;Decreased balance;Decreased mobility;Decreased knowledge of use of DME;Decreased safety awareness;Decreased knowledge of precautions;Pain       PT Treatment Interventions DME instruction;Gait training;Functional mobility training;Therapeutic activities;Therapeutic exercise;Neuromuscular re-education;Patient/family education    PT Goals (Current goals can be found in the Care Plan section)  Acute Rehab PT Goals Patient Stated Goal: home when able PT Goal Formulation: With patient Time For Goal Achievement: 01/22/20 Potential to Achieve Goals: Good    Frequency Min 5X/week   Barriers to discharge        Co-evaluation               AM-PAC PT "6 Clicks" Mobility  Outcome Measure Help needed turning from your back to your side while in a flat bed without using bedrails?: None Help needed moving from lying on your back to sitting on the side of a flat bed without using bedrails?: None Help needed moving to and from a bed to a chair (including a wheelchair)?: None Help needed standing up from a chair using your arms (e.g., wheelchair or bedside chair)?: None Help needed to walk in hospital room?: None Help needed climbing 3-5 steps with a railing? : A Little 6 Click Score: 23    End of Session Equipment Utilized During Treatment: Back brace Activity Tolerance: Patient tolerated treatment well Patient left: in chair;with call bell/phone within reach Nurse Communication: Mobility status PT Visit Diagnosis: Unsteadiness on feet (R26.81);Pain Pain - part of body: (back)    Time: 9532-0233 PT Time Calculation (min) (ACUTE ONLY): 21 min   Charges:   PT Evaluation $PT Eval Low Complexity: 1  Low          Justin Patel, PT, DPT Acute Rehabilitation Services Pager: [phone] Office: [phone]   Thelma Comp 01/15/2020, 1:27 PM

## 2020-01-16 LAB — GLUCOSE, CAPILLARY: Glucose-Capillary: 73 mg/dL (ref 70–99)

## 2020-01-16 MED ORDER — MUPIROCIN 2 % EX OINT
TOPICAL_OINTMENT | Freq: Two times a day (BID) | CUTANEOUS | Status: DC
  Filled 2020-01-16: qty 22

## 2020-01-16 MED ORDER — METHOCARBAMOL 500 MG PO TABS: 500.0000 mg | ORAL_TABLET | Freq: Four times a day (QID) | ORAL | 1 refills | Status: DC | PRN

## 2020-01-16 MED ORDER — HYDROCODONE-ACETAMINOPHEN 7.5-325 MG PO TABS: 1.0000 | ORAL_TABLET | ORAL | 0 refills | Status: AC | PRN

## 2020-01-16 NOTE — Discharge Instructions (Signed)
Wound Care REMOVED OUTER DRESSING IN 2 DAYS Leave incision open to air. You may shower. Do not scrub directly on incision.  Do not put any creams, lotions, or ointments on incision. REMOVE GLUE IN 2 WEEKS Activity Walk each and every day, increasing distance each day. No lifting greater than 5 lbs.  Avoid bending, arching, and twisting. No driving for 2 weeks; may ride as a passenger locally. If provided with back brace, wear when out of bed.  It is not necessary to wear in bed. Diet Resume your normal diet.  Return to Work Will be discussed at you follow up appointment. Call Your Doctor If Any of These Occur Redness, drainage, or swelling at the wound.  Temperature greater than 101 degrees. Severe pain not relieved by pain medication. Incision starts to come apart. Follow Up Appt Call today for appointment in 3-4 weeks ([phone]) or for problems.  If you have any hardware placed in your spine, you will need an x-ray before your appointment.

## 2020-01-16 NOTE — Progress Notes (Addendum)
Physical Therapy Treatment and Discharge Patient Details Name: Justin Patel MRN: [id number] DOB: [birth year] Today's Date: 01/16/2020    History of Present Illness Pt is a 69 y/o male who initially presents with L1 fx, now s/p decompressive Laminectomy L1 with pedicle screw fixation from T10-L2. PMHx includes anxiety, CHF, anemia, DM, CKD, HTN, hx of L TKA, ACDF (2013) and previous lumbar sx 04/2018.     PT Comments    Pt progressing well with post-op mobility. He was able to demonstrate transfers and ambulation with gross modified independence and no AD. Education was reinforced on precautions, brace application/wearing schedule, appropriate activity progression, and car transfer. Pt has met acute PT goals and we will sign off at this time. If needs change, please reconsult.      Follow Up Recommendations  No PT follow up;Supervision for mobility/OOB     Equipment Recommendations  None recommended by PT    Recommendations for Other Services       Precautions / Restrictions Precautions Precautions: Fall;Back Precaution Comments: verbally reviewed 3/3 back precautions, pt with good recall from previous surgeries  Required Braces or Orthoses: Spinal Brace Spinal Brace: Thoracolumbosacral orthotic;Applied in sitting position Restrictions Weight Bearing Restrictions: No    Mobility  Bed Mobility               General bed mobility comments: Pt was received sitting up in recliner.   Transfers Overall transfer level: Modified independent Equipment used: None Transfers: Sit to/from Stand           General transfer comment: Pt demonstrated proper hand placement on seated surface for safety. No assist required.   Ambulation/Gait Ambulation/Gait assistance: Modified independent (Device/Increase time) Gait Distance (Feet): 350 Feet Assistive device: None Gait Pattern/deviations: Step-through pattern;Decreased stride length;Leaning posteriorly Gait velocity:  Decreased Gait velocity interpretation: <1.8 ft/sec, indicate of risk for recurrent falls General Gait Details: Pt with increased lumbar lordosis and posterior lean. No LOB noted.    Stairs             Wheelchair Mobility    Modified Rankin (Stroke Patients Only)       Balance Overall balance assessment: Mild deficits observed, not formally tested                                          Cognition Arousal/Alertness: Awake/alert Behavior During Therapy: WFL for tasks assessed/performed Overall Cognitive Status: Within Functional Limits for tasks assessed                                        Exercises      General Comments        Pertinent Vitals/Pain Pain Assessment: Faces Faces Pain Scale: Hurts little more Pain Location: back, incisional Pain Descriptors / Indicators: Discomfort;Guarding;Sore Pain Intervention(s): Limited activity within patient's tolerance;Monitored during session;Repositioned    Home Living                      Prior Function            PT Goals (current goals can now be found in the care plan section) Acute Rehab PT Goals Patient Stated Goal: home when able PT Goal Formulation: With patient Time For Goal Achievement: 01/22/20 Potential to Achieve Goals: Good Progress towards PT goals:  Goals met/education completed, patient discharged from PT    Frequency    Min 5X/week      PT Plan Current plan remains appropriate    Co-evaluation              AM-PAC PT "6 Clicks" Mobility   Outcome Measure  Help needed turning from your back to your side while in a flat bed without using bedrails?: None Help needed moving from lying on your back to sitting on the side of a flat bed without using bedrails?: None Help needed moving to and from a bed to a chair (including a wheelchair)?: None Help needed standing up from a chair using your arms (e.g., wheelchair or bedside chair)?:  None Help needed to walk in hospital room?: None Help needed climbing 3-5 steps with a railing? : A Little 6 Click Score: 23    End of Session Equipment Utilized During Treatment: Back brace Activity Tolerance: Patient tolerated treatment well Patient left: in chair;with call bell/phone within reach Nurse Communication: Mobility status PT Visit Diagnosis: Unsteadiness on feet (R26.81);Pain Pain - part of body: (back)     Time: 3532-9924 PT Time Calculation (min) (ACUTE ONLY): 11 min  Charges:  $Gait Training: 8-22 mins                     Rolinda Roan, PT, DPT Acute Rehabilitation Services Pager: [phone] Office: [phone]    Thelma Comp 01/16/2020, 10:02 AM

## 2020-01-16 NOTE — Progress Notes (Signed)
Occupational Therapy Treatment Patient Details Name: Justin Patel MRN: [id number] DOB: 03-31-1951 Today's Date: 01/16/2020    History of present illness Pt is a 69 y/o male who initially presents with L1 fx, now s/p decompressive Laminectomy L1 with pedicle screw fixation from T10-L2. PMHx includes anxiety, CHF, anemia, DM, CKD, HTN, hx of L TKA, ACDF (2013) and previous lumbar sx 04/2018.    OT comments  Pt making steady progress towards OT goals this session. Session focus on education related to maintaining back precautions during ADLs. Pt declined transfer training but able to verbalize back precautions, log roll technique, and brace wearing schedule. Pt reports wife can assist with ADLs and IADLs at home. Agree with DC plan below, will follow acutely per POC.    Follow Up Recommendations  No OT follow up;Supervision - Intermittent    Equipment Recommendations  None recommended by OT    Recommendations for Other Services      Precautions / Restrictions Precautions Precautions: Fall;Back Precaution Comments: verbally reviewed 3/3 back precautions, pt with good recall from previous surgeries  Required Braces or Orthoses: Spinal Brace Spinal Brace: Thoracolumbosacral orthotic;Applied in sitting position Restrictions Weight Bearing Restrictions: No       Mobility Bed Mobility               General bed mobility comments: pt able to verbalize log roll technique  Transfers Overall transfer level: Modified independent Equipment used: None Transfers: Sit to/from Stand           General transfer comment: pt declined transfer training    Balance Overall balance assessment: Mild deficits observed, not formally tested                                         ADL either performed or assessed with clinical judgement   ADL Overall ADL's : Needs assistance/impaired       Grooming Details (indicate cue type and reason): education provided on  compensatory methods related to grooming tasks to maintain back precautions with pt verbalizing understanding           Upper Body Dressing Details (indicate cue type and reason): pt with TLSO donned and able to verbalize how to don / doff as well as accurate wear schedule   Lower Body Dressing Details (indicate cue type and reason): pt reports can figure four but reports more discomfort doing so today; pt reports likely to wear slide on shoes at home and wife can assist with LB dressing   Toilet Transfer Details (indicate cue type and reason): pt declined transfer traning and pt had just been up   Toileting - Clothing Manipulation Details (indicate cue type and reason): education on maintaining back precautions during pericare with pt verbalizing       General ADL Comments: session focus on education related to maintaining back precautionsn during ADLs     Vision       Perception     Praxis      Cognition Arousal/Alertness: Awake/alert Behavior During Therapy: WFL for tasks assessed/performed Overall Cognitive Status: Within Functional Limits for tasks assessed                                          Exercises     Shoulder Instructions  General Comments incision unobserved    Pertinent Vitals/ Pain       Pain Assessment: Faces Faces Pain Scale: Hurts little more Pain Location: back, incisional Pain Descriptors / Indicators: Discomfort;Guarding;Sore Pain Intervention(s): Limited activity within patient's tolerance;Monitored during session  Home Living                                          Prior Functioning/Environment              Frequency  Min 2X/week        Progress Toward Goals  OT Goals(current goals can now be found in the care plan section)  Progress towards OT goals: Progressing toward goals  Acute Rehab OT Goals Patient Stated Goal: home when able OT Goal Formulation: With patient Time For  Goal Achievement: 01/29/20 Potential to Achieve Goals: Good  Plan Discharge plan remains appropriate    Co-evaluation                 AM-PAC OT "6 Clicks" Daily Activity     Outcome Measure   Help from another person eating meals?: None Help from another person taking care of personal grooming?: A Little Help from another person toileting, which includes using toliet, bedpan, or urinal?: A Little Help from another person bathing (including washing, rinsing, drying)?: A Little Help from another person to put on and taking off regular upper body clothing?: A Little Help from another person to put on and taking off regular lower body clothing?: A Little 6 Click Score: 19    End of Session Equipment Utilized During Treatment: Back brace  OT Visit Diagnosis: Pain;Other abnormalities of gait and mobility (R26.89)   Activity Tolerance Patient tolerated treatment well   Patient Left in chair;with call bell/phone within reach   Nurse Communication Mobility status        Time: 4536-4680 OT Time Calculation (min): 17 min  Charges: OT General Charges $OT Visit: 1 Visit OT Treatments $Self Care/Home Management : 8-22 mins  Lanier Clam., COTA/L Acute Rehabilitation Services [phone] [place] 01/16/2020, 10:43 AM

## 2020-01-16 NOTE — Plan of Care (Signed)
Patient alert and oriented, mae's well, voiding adequate amount of urine, swallowing without difficulty, no c/o pain at time of discharge. Patient discharged home with family. Script and discharged instructions given to patient. Patient and family stated understanding of instructions given. Patient has an appointment with Dr.Stern    

## 2020-01-16 NOTE — Progress Notes (Addendum)
Subjective: Patient reports "I'm feeling good, just sore"   Objective: Vital signs in last 24 hours: Temp:  [98.2 F (36.8 C)-98.6 F (37 C)] 98.6 F (37 C) (06/09 0726) Pulse Rate:  [66-80] 80 (06/09 0726) Resp:  [16-18] 16 (06/09 0726) BP: (108-128)/(59-81) 109/61 (06/09 0726) SpO2:  [98 %-100 %] 98 % (06/09 0726)  Intake/Output from previous day: 06/08 0701 - 06/09 0700 In: 175  Out: 1250 [Urine:1150; Drains:100] Intake/Output this shift: No intake/output data recorded.  Alert & conversant, sitting in chair eating breakfast. Reports back soreness with position changes and deep breathing exercises, but denies pain at present. Good strength BLE. Incision without erythema, selling or drainage beneath honeycomb and Dermabond. Hemovac pulled this am.   Lab Results: Recent Labs    01/14/20 1155  WBC 7.2  HGB 14.3  HCT 44.3  PLT 221   BMET Recent Labs    01/14/20 1155  NA 138  K 3.8  CL 103  CO2 26  GLUCOSE 148*  BUN 12  CREATININE 1.15  CALCIUM 9.1    Studies/Results: DG Lumbar Spine Complete  Result Date: 01/14/2020 CLINICAL DATA:  69 year old male undergoing lumbar surgery. EXAM: LUMBAR SPINE - COMPLETE 4+ VIEW; DG C-ARM 1-60 MIN COMPARISON:  Lumbar spine CT 11/06/2019. FLUOROSCOPY TIME:  0 minutes 57 seconds FINDINGS: Same numbering system as on the March lumbar CT. Four intraoperative fluoroscopic spot views of the lower thoracic spine. Bilateral transpedicular screws are in place, from T10 to T12 judging by the L1 compression fracture visible on the prior CT. Connecting rods not yet in place. Faintly visible chronic L2 fusion hardware. IMPRESSION: Placement of bilateral T10 through T12 pedicle screws. Electronically Signed   By: Genevie Ann M.D.   On: 01/14/2020 18:00   DG C-Arm 1-60 Min  Result Date: 01/14/2020 CLINICAL DATA:  69 year old male undergoing lumbar surgery. EXAM: LUMBAR SPINE - COMPLETE 4+ VIEW; DG C-ARM 1-60 MIN COMPARISON:  Lumbar spine CT 11/06/2019.  FLUOROSCOPY TIME:  0 minutes 57 seconds FINDINGS: Same numbering system as on the March lumbar CT. Four intraoperative fluoroscopic spot views of the lower thoracic spine. Bilateral transpedicular screws are in place, from T10 to T12 judging by the L1 compression fracture visible on the prior CT. Connecting rods not yet in place. Faintly visible chronic L2 fusion hardware. IMPRESSION: Placement of bilateral T10 through T12 pedicle screws. Electronically Signed   By: Genevie Ann M.D.   On: 01/14/2020 18:00    Assessment/Plan: improving  LOS: 2 days  Ok per DrStern to d/c to home this am. Pt verbalizes understanding of d/cnstructions and appt has been made for 3 week f/u appt. Norco and Robaxin will be eRx'ed to his pharmacy for prn home use.    Verdis Prime 01/16/2020, 8:12 AM   Patient is doing well.  Discharge home.

## 2020-01-16 NOTE — Discharge Summary (Signed)
Physician Discharge Summary  Patient ID: SHAHIN KNIERIM MRN: [id number] DOB/AGE: November 20, 1950 69 y.o.  Admit date: 01/14/2020 Discharge date: 01/16/2020  Admission Diagnoses: Wedge compression fracture of lumbar vertebra with retropulsion of bone into spinal canal, lumbar radiculopathy, lumbago, kyphotic deformity     Discharge Diagnoses: Wedge compression fracture of lumbar vertebra with retropulsion of bone into spinal canal, lumbar radiculopathy, lumbago, kyphotic deformity  s/p Decompressive Laminectomy Lumbar One with pedicle screw fixation from Thoracic Ten to Lumbar Two (N/A) - Decompressive Laminectomy Lumbar One with pedicle screw fixation from Thoracic Ten to Lumbar Two with posterolateral arthrodesis with tamping of bone from the spinal canal     Active Problems:   Fracture of L1 vertebra [facility])   Discharged Condition: good  Hospital Course: Jabri Blancett was admitted for surgery with dx L1 compression fracture and radiculopathy. Following uncomplicated surgery (above), he recovered well and transferred to [facility] for nursing care and therapies. He is mobilizing nicely with good pain control.   Consults: None  Significant Diagnostic Studies: radiology: X-Ray: intra-op  Treatments: surgery: Decompressive Laminectomy Lumbar One with pedicle screw fixation from Thoracic Ten to Lumbar Two (N/A) - Decompressive Laminectomy Lumbar One with pedicle screw fixation from Thoracic Ten to Lumbar Two with posterolateral arthrodesis with tamping of bone from the spinal canal     Discharge Exam: Blood pressure 109/61, pulse 80, temperature 98.6 F (37 C), temperature source Oral, resp. rate 16, height 5' 6" (1.676 m), weight 82.1 kg, SpO2 98 %. Alert & conversant, sitting in chair eating breakfast. Reports back soreness with position changes and deep breathing exercises, but denies pain at present. Good strength BLE. Incision without erythema, selling or drainage beneath honeycomb and Dermabond.  Hemovac pulled this am.      Disposition: Discharge disposition: 01-Home or Self Care Pt verbalizes understanding of d/cnstructions and appt has been made for 3 week f/u appt. Norco and Robaxin will be eRx'ed to his pharmacy for prn home use.           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   Trulicity [dulaglutide] Nausea And Vomiting      Medication List    TAKE these medications   amLODipine 10 MG tablet Commonly known as: NORVASC TAKE ONE TABLET EVERY DAY   blood glucose meter kit and supplies Kit Dispense based on patient and insurance preference. Check once daily. ICD doing E11.9.   CALCIUM 600/VITAMIN D PO Take 1 tablet by mouth daily.   doxycycline 100 MG tablet Commonly known as: VIBRA-TABS Take 1 tablet (100 mg total) by mouth 2 (two) times daily.   DULoxetine 60 MG capsule Commonly known as: CYMBALTA TAKE 1 CAPSULE BY MOUTH EVERY DAY What changed: how much to take   esomeprazole 40 MG capsule Commonly known as: NEXIUM TAKE 1 CAPSULE BY MOUTH ONCE DAILY   furosemide 20 MG tablet  Commonly known as: LASIX TAKE ONE TABLET BY MOUTH EVERY DAY   HYDROcodone-acetaminophen 5-325 MG tablet Commonly known as: NORCO/VICODIN Take 1 tablet by mouth every 6 (six) hours as needed for moderate pain. What changed: Another medication with the same name was added. Make sure you understand how and when to take each.   HYDROcodone-acetaminophen 7.5-325 MG tablet Commonly  known as: NORCO Take 1-2 tablets by mouth every 4 (four) hours as needed for up to 7 days for severe pain. What changed: You were already taking a medication with the same name, and this prescription was added. Make sure you understand how and when to take each.   methocarbamol 500 MG tablet Commonly known as: ROBAXIN Take 1 tablet (500 mg total) by mouth every 6 (six) hours as needed for muscle spasms.   metoprolol succinate 50 MG 24 hr tablet Commonly known as: TOPROL-XL TAKE 1 TABLET BY MOUTH DAILY WITH OR IMMEDIATLY FOLLOWING A MEAL What changed: See the new instructions.   multivitamin capsule Take 1 capsule by mouth daily. With copper and zinc   nystatin cream Commonly known as: MYCOSTATIN Apply 1 application topically 2 (two) times daily.   nystatin-triamcinolone ointment Commonly known as: MYCOLOG Apply 1 application topically 2 (two) times daily.   primidone 50 MG tablet Commonly known as: MYSOLINE Take 1 tablet (50 mg total) by mouth at bedtime. TAKE 1/2 TABLET AT BEDTIME FOR 7 DAYS THEN INCREASE TO TAKE ONE TABLET AT BEDTIME What changed: additional instructions   rosuvastatin 40 MG tablet Commonly known as: CRESTOR TAKE ONE TABLET EVERY DAY   sucralfate 1 g tablet Commonly known as: CARAFATE TAKE ONE TABLET BY MOUTH FOUR TIMES DAILY   tamsulosin 0.4 MG Caps capsule Commonly known as: FLOMAX TAKE 1 CAPSULE BY MOUTH EVERY DAY   Tradjenta 5 MG Tabs tablet Generic drug: linagliptin TAKE ONE TABLET BY MOUTH EVERY DAY What changed: how much to take   Vitamin D (Ergocalciferol) 1.25 MG (50000 UNIT) Caps capsule Commonly known as: DRISDOL TAKE 1 CAPSULE EVERY 7 DAYS   Vitamin D 50 MCG (2000 UT) Caps Take 4,000 Units by mouth daily.        Signed: Verdis Prime 01/16/2020, 8:16 AM   Patient is doing well.  Discharge home.

## 2020-01-17 MED FILL — Sodium Chloride IV Soln 0.9%: INTRAVENOUS | Qty: 1000 | Status: AC

## 2020-01-17 MED FILL — Heparin Sodium (Porcine) Inj 1000 Unit/ML: INTRAMUSCULAR | Qty: 30 | Status: AC

## 2020-01-22 ENCOUNTER — Other Ambulatory Visit: Payer: Self-pay | Admitting: *Deleted

## 2020-01-22 NOTE — Patient Outreach (Signed)
[facility]) Care Management  01/22/2020  Justin Patel July 16, 1951 [id number]   General discharge red flag alert Day #4 Date : 01/21/20 Reason : Who reached Patient  Other questions/problems? Yes   Outreach attempt #1 Subjective: Unsuccessful outreach call to patient , no answer able to leave a HIPAA compliant message for return call.  Received immediate return call.   Doing fine after surgery, states that he does not have any questions. He reports having reasonable pain control after back surgery and has medications to manage. He reports tolerating mobility slowly in home, denies concerns regarding incision and reports having a bowel movement. He verbalized understanding of notifying surgeon any concern regarding recovery.  He states that he has follow up appointment with surgeon and support of wife for transportation.  He denies any other concerns expressed  appreciation of this call but does not like automated calls. Explained that he has completed the 2 post discharge calls.    Plan Will plan case closure will send [facility] successful outreach letter.    Joylene Draft, RN, BSN  [place] Management Coordinator  [phone]- Mobile [phone]- Toll Free Main Office

## 2020-01-28 ENCOUNTER — Emergency Department: Payer: Medicare PPO

## 2020-01-28 ENCOUNTER — Encounter: Payer: Self-pay | Admitting: *Deleted

## 2020-01-28 ENCOUNTER — Other Ambulatory Visit: Payer: Self-pay

## 2020-01-28 ENCOUNTER — Inpatient Hospital Stay
Admission: EM | Admit: 2020-01-28 | Discharge: 2020-01-29 | DRG: 440 | Disposition: A | Discharge status: Other Acute Inpatient Hospital | Payer: Medicare PPO | Attending: Internal Medicine | Admitting: Internal Medicine

## 2020-01-28 DIAGNOSIS — R932 Abnormal findings on diagnostic imaging of liver and biliary tract: Secondary | ICD-10-CM | POA: Diagnosis not present

## 2020-01-28 DIAGNOSIS — Z881 Allergy status to other antibiotic agents status: Secondary | ICD-10-CM

## 2020-01-28 DIAGNOSIS — K8020 Calculus of gallbladder without cholecystitis without obstruction: Secondary | ICD-10-CM

## 2020-01-28 DIAGNOSIS — M810 Age-related osteoporosis without current pathological fracture: Secondary | ICD-10-CM | POA: Diagnosis present

## 2020-01-28 DIAGNOSIS — M1991 Primary osteoarthritis, unspecified site: Secondary | ICD-10-CM | POA: Diagnosis present

## 2020-01-28 DIAGNOSIS — I10 Essential (primary) hypertension: Secondary | ICD-10-CM | POA: Diagnosis not present

## 2020-01-28 DIAGNOSIS — Z833 Family history of diabetes mellitus: Secondary | ICD-10-CM | POA: Diagnosis not present

## 2020-01-28 DIAGNOSIS — K8051 Calculus of bile duct without cholangitis or cholecystitis with obstruction: Secondary | ICD-10-CM | POA: Diagnosis present

## 2020-01-28 DIAGNOSIS — N183 Chronic kidney disease, stage 3 unspecified: Secondary | ICD-10-CM | POA: Diagnosis present

## 2020-01-28 DIAGNOSIS — Z20822 Contact with and (suspected) exposure to covid-19: Secondary | ICD-10-CM | POA: Diagnosis present

## 2020-01-28 DIAGNOSIS — I129 Hypertensive chronic kidney disease with stage 1 through stage 4 chronic kidney disease, or unspecified chronic kidney disease: Secondary | ICD-10-CM | POA: Diagnosis present

## 2020-01-28 DIAGNOSIS — R109 Unspecified abdominal pain: Secondary | ICD-10-CM | POA: Diagnosis not present

## 2020-01-28 DIAGNOSIS — R52 Pain, unspecified: Secondary | ICD-10-CM

## 2020-01-28 DIAGNOSIS — E785 Hyperlipidemia, unspecified: Secondary | ICD-10-CM | POA: Diagnosis present

## 2020-01-28 DIAGNOSIS — Z79899 Other long term (current) drug therapy: Secondary | ICD-10-CM | POA: Diagnosis not present

## 2020-01-28 DIAGNOSIS — R935 Abnormal findings on diagnostic imaging of other abdominal regions, including retroperitoneum: Secondary | ICD-10-CM | POA: Diagnosis not present

## 2020-01-28 DIAGNOSIS — Z87442 Personal history of urinary calculi: Secondary | ICD-10-CM | POA: Diagnosis not present

## 2020-01-28 DIAGNOSIS — Z7984 Long term (current) use of oral hypoglycemic drugs: Secondary | ICD-10-CM

## 2020-01-28 DIAGNOSIS — Z981 Arthrodesis status: Secondary | ICD-10-CM

## 2020-01-28 DIAGNOSIS — Z8249 Family history of ischemic heart disease and other diseases of the circulatory system: Secondary | ICD-10-CM | POA: Diagnosis not present

## 2020-01-28 DIAGNOSIS — K851 Biliary acute pancreatitis without necrosis or infection: Principal | ICD-10-CM | POA: Diagnosis present

## 2020-01-28 DIAGNOSIS — Z8262 Family history of osteoporosis: Secondary | ICD-10-CM | POA: Diagnosis not present

## 2020-01-28 DIAGNOSIS — K219 Gastro-esophageal reflux disease without esophagitis: Secondary | ICD-10-CM | POA: Diagnosis present

## 2020-01-28 DIAGNOSIS — M5489 Other dorsalgia: Secondary | ICD-10-CM | POA: Diagnosis not present

## 2020-01-28 DIAGNOSIS — K8050 Calculus of bile duct without cholangitis or cholecystitis without obstruction: Secondary | ICD-10-CM | POA: Diagnosis present

## 2020-01-28 DIAGNOSIS — F419 Anxiety disorder, unspecified: Secondary | ICD-10-CM | POA: Diagnosis present

## 2020-01-28 DIAGNOSIS — K859 Acute pancreatitis without necrosis or infection, unspecified: Secondary | ICD-10-CM | POA: Diagnosis not present

## 2020-01-28 DIAGNOSIS — R1032 Left lower quadrant pain: Secondary | ICD-10-CM | POA: Diagnosis present

## 2020-01-28 DIAGNOSIS — Z888 Allergy status to other drugs, medicaments and biological substances status: Secondary | ICD-10-CM | POA: Diagnosis not present

## 2020-01-28 LAB — URINALYSIS, COMPLETE (UACMP) WITH MICROSCOPIC
Bacteria, UA: NONE SEEN
Bilirubin Urine: NEGATIVE
Glucose, UA: NEGATIVE mg/dL
Hgb urine dipstick: NEGATIVE
Ketones, ur: NEGATIVE mg/dL
Leukocytes,Ua: NEGATIVE
Nitrite: NEGATIVE
Protein, ur: 30 mg/dL — AB
Specific Gravity, Urine: 1.026 (ref 1.005–1.030)
pH: 5 (ref 5.0–8.0)

## 2020-01-28 LAB — COMPREHENSIVE METABOLIC PANEL
ALT: 14 U/L (ref 0–44)
AST: 23 U/L (ref 15–41)
Albumin: 3.6 g/dL (ref 3.5–5.0)
Alkaline Phosphatase: 71 U/L (ref 38–126)
Anion gap: 12 (ref 5–15)
BUN: 13 mg/dL (ref 8–23)
CO2: 22 mmol/L (ref 22–32)
Calcium: 9.5 mg/dL (ref 8.9–10.3)
Chloride: 103 mmol/L (ref 98–111)
Creatinine, Ser: 1.33 mg/dL — ABNORMAL HIGH (ref 0.61–1.24)
GFR calc Af Amer: >60 mL/min (ref >60)
GFR calc non Af Amer: 55 mL/min — ABNORMAL LOW (ref >60)
Glucose, Bld: 85 mg/dL (ref 70–99)
Potassium: 4 mmol/L (ref 3.5–5.1)
Sodium: 137 mmol/L (ref 135–145)
Total Bilirubin: 0.6 mg/dL (ref 0.3–1.2)
Total Protein: 7.4 g/dL (ref 6.5–8.1)

## 2020-01-28 LAB — CBC
HCT: 36.8 % — ABNORMAL LOW (ref 39.0–52.0)
Hemoglobin: 12.2 g/dL — ABNORMAL LOW (ref 13.0–17.0)
MCH: 32 pg (ref 26.0–34.0)
MCHC: 33.2 g/dL (ref 30.0–36.0)
MCV: 96.6 fL (ref 80.0–100.0)
Platelets: 498 10*3/uL — ABNORMAL HIGH (ref 150–400)
RBC: 3.81 MIL/uL — ABNORMAL LOW (ref 4.22–5.81)
RDW: 14.4 % (ref 11.5–15.5)
WBC: 13.8 10*3/uL — ABNORMAL HIGH (ref 4.0–10.5)
nRBC: 0 % (ref 0.0–0.2)

## 2020-01-28 LAB — LIPASE, BLOOD: Lipase: 130 U/L — ABNORMAL HIGH (ref 11–51)

## 2020-01-28 MED ORDER — SODIUM CHLORIDE 0.9 % IV BOLUS
1000.0000 mL | Freq: Once | INTRAVENOUS | Status: AC
  Administered 2020-01-28: 1000 mL via INTRAVENOUS

## 2020-01-28 MED ORDER — HYDROMORPHONE HCL 1 MG/ML IJ SOLN
1.0000 mg | Freq: Once | INTRAMUSCULAR | Status: AC
  Administered 2020-01-28: 1 mg via INTRAVENOUS
  Filled 2020-01-28: qty 1

## 2020-01-28 MED ORDER — ONDANSETRON HCL 4 MG/2ML IJ SOLN
4.0000 mg | Freq: Once | INTRAMUSCULAR | Status: AC
  Administered 2020-01-28: 4 mg via INTRAVENOUS
  Filled 2020-01-28: qty 2

## 2020-01-28 NOTE — ED Triage Notes (Signed)
Pt to ED reporting left sided flank pain x 1 week that has worsened today. Lumbar spinal fusion performed 2 weeks ago without complications. Surgical site shows no signs of infection or complications. No urinary changes. No nausea or vomiting, diarrhea reported this morning that pt took imodium to treat without relief. Hx of AKI, kidney stones and diverticulitis.

## 2020-01-29 ENCOUNTER — Inpatient Hospital Stay (HOSPITAL_COMMUNITY)
Admission: EM | Admit: 2020-01-29 | Discharge: 2020-01-30 | DRG: 446 | Disposition: A | Discharge status: Other Acute Inpatient Hospital | Payer: Medicare PPO | Source: Other Acute Inpatient Hospital | Attending: Internal Medicine | Admitting: Internal Medicine

## 2020-01-29 ENCOUNTER — Emergency Department: Payer: Medicare PPO

## 2020-01-29 ENCOUNTER — Inpatient Hospital Stay: Admission: AD | Admit: 2020-01-29 | Payer: Medicare PPO | Admitting: Internal Medicine

## 2020-01-29 ENCOUNTER — Encounter (HOSPITAL_COMMUNITY): Payer: Self-pay | Admitting: Internal Medicine

## 2020-01-29 DIAGNOSIS — Z8041 Family history of malignant neoplasm of ovary: Secondary | ICD-10-CM

## 2020-01-29 DIAGNOSIS — G894 Chronic pain syndrome: Secondary | ICD-10-CM | POA: Diagnosis present

## 2020-01-29 DIAGNOSIS — N138 Other obstructive and reflux uropathy: Secondary | ICD-10-CM | POA: Diagnosis present

## 2020-01-29 DIAGNOSIS — K219 Gastro-esophageal reflux disease without esophagitis: Secondary | ICD-10-CM | POA: Diagnosis present

## 2020-01-29 DIAGNOSIS — K8020 Calculus of gallbladder without cholecystitis without obstruction: Secondary | ICD-10-CM | POA: Diagnosis not present

## 2020-01-29 DIAGNOSIS — Z9884 Bariatric surgery status: Secondary | ICD-10-CM

## 2020-01-29 DIAGNOSIS — E669 Obesity, unspecified: Secondary | ICD-10-CM | POA: Diagnosis not present

## 2020-01-29 DIAGNOSIS — F39 Unspecified mood [affective] disorder: Secondary | ICD-10-CM | POA: Diagnosis present

## 2020-01-29 DIAGNOSIS — N183 Chronic kidney disease, stage 3 unspecified: Secondary | ICD-10-CM | POA: Diagnosis present

## 2020-01-29 DIAGNOSIS — Z8042 Family history of malignant neoplasm of prostate: Secondary | ICD-10-CM

## 2020-01-29 DIAGNOSIS — K859 Acute pancreatitis without necrosis or infection, unspecified: Secondary | ICD-10-CM | POA: Diagnosis not present

## 2020-01-29 DIAGNOSIS — I10 Essential (primary) hypertension: Secondary | ICD-10-CM | POA: Diagnosis not present

## 2020-01-29 DIAGNOSIS — Z808 Family history of malignant neoplasm of other organs or systems: Secondary | ICD-10-CM | POA: Diagnosis not present

## 2020-01-29 DIAGNOSIS — Z807 Family history of other malignant neoplasms of lymphoid, hematopoietic and related tissues: Secondary | ICD-10-CM

## 2020-01-29 DIAGNOSIS — Z8262 Family history of osteoporosis: Secondary | ICD-10-CM

## 2020-01-29 DIAGNOSIS — K449 Diaphragmatic hernia without obstruction or gangrene: Secondary | ICD-10-CM | POA: Diagnosis not present

## 2020-01-29 DIAGNOSIS — Z801 Family history of malignant neoplasm of trachea, bronchus and lung: Secondary | ICD-10-CM

## 2020-01-29 DIAGNOSIS — N179 Acute kidney failure, unspecified: Secondary | ICD-10-CM | POA: Diagnosis present

## 2020-01-29 DIAGNOSIS — N400 Enlarged prostate without lower urinary tract symptoms: Secondary | ICD-10-CM | POA: Diagnosis present

## 2020-01-29 DIAGNOSIS — I129 Hypertensive chronic kidney disease with stage 1 through stage 4 chronic kidney disease, or unspecified chronic kidney disease: Secondary | ICD-10-CM | POA: Diagnosis present

## 2020-01-29 DIAGNOSIS — K8050 Calculus of bile duct without cholangitis or cholecystitis without obstruction: Secondary | ICD-10-CM | POA: Diagnosis not present

## 2020-01-29 DIAGNOSIS — R109 Unspecified abdominal pain: Secondary | ICD-10-CM | POA: Diagnosis not present

## 2020-01-29 DIAGNOSIS — M1991 Primary osteoarthritis, unspecified site: Secondary | ICD-10-CM | POA: Diagnosis present

## 2020-01-29 DIAGNOSIS — Z833 Family history of diabetes mellitus: Secondary | ICD-10-CM | POA: Diagnosis not present

## 2020-01-29 DIAGNOSIS — Z20822 Contact with and (suspected) exposure to covid-19: Secondary | ICD-10-CM | POA: Diagnosis not present

## 2020-01-29 DIAGNOSIS — K811 Chronic cholecystitis: Secondary | ICD-10-CM | POA: Diagnosis not present

## 2020-01-29 DIAGNOSIS — F419 Anxiety disorder, unspecified: Secondary | ICD-10-CM | POA: Diagnosis present

## 2020-01-29 DIAGNOSIS — N1831 Chronic kidney disease, stage 3a: Secondary | ICD-10-CM | POA: Diagnosis present

## 2020-01-29 DIAGNOSIS — K660 Peritoneal adhesions (postprocedural) (postinfection): Secondary | ICD-10-CM | POA: Diagnosis not present

## 2020-01-29 DIAGNOSIS — K8070 Calculus of gallbladder and bile duct without cholecystitis without obstruction: Secondary | ICD-10-CM | POA: Diagnosis not present

## 2020-01-29 DIAGNOSIS — Z981 Arthrodesis status: Secondary | ICD-10-CM

## 2020-01-29 DIAGNOSIS — Z7984 Long term (current) use of oral hypoglycemic drugs: Secondary | ICD-10-CM | POA: Diagnosis not present

## 2020-01-29 DIAGNOSIS — R1032 Left lower quadrant pain: Secondary | ICD-10-CM | POA: Diagnosis present

## 2020-01-29 DIAGNOSIS — Z832 Family history of diseases of the blood and blood-forming organs and certain disorders involving the immune mechanism: Secondary | ICD-10-CM

## 2020-01-29 DIAGNOSIS — K8042 Calculus of bile duct with acute cholecystitis without obstruction: Secondary | ICD-10-CM | POA: Diagnosis not present

## 2020-01-29 DIAGNOSIS — Z96652 Presence of left artificial knee joint: Secondary | ICD-10-CM | POA: Diagnosis present

## 2020-01-29 DIAGNOSIS — Z881 Allergy status to other antibiotic agents status: Secondary | ICD-10-CM

## 2020-01-29 DIAGNOSIS — E785 Hyperlipidemia, unspecified: Secondary | ICD-10-CM | POA: Diagnosis not present

## 2020-01-29 DIAGNOSIS — E119 Type 2 diabetes mellitus without complications: Secondary | ICD-10-CM

## 2020-01-29 DIAGNOSIS — Z8249 Family history of ischemic heart disease and other diseases of the circulatory system: Secondary | ICD-10-CM

## 2020-01-29 DIAGNOSIS — E1122 Type 2 diabetes mellitus with diabetic chronic kidney disease: Secondary | ICD-10-CM | POA: Diagnosis present

## 2020-01-29 DIAGNOSIS — K8051 Calculus of bile duct without cholangitis or cholecystitis with obstruction: Secondary | ICD-10-CM | POA: Diagnosis present

## 2020-01-29 DIAGNOSIS — R935 Abnormal findings on diagnostic imaging of other abdominal regions, including retroperitoneum: Secondary | ICD-10-CM | POA: Diagnosis not present

## 2020-01-29 DIAGNOSIS — K831 Obstruction of bile duct: Secondary | ICD-10-CM | POA: Diagnosis not present

## 2020-01-29 DIAGNOSIS — R932 Abnormal findings on diagnostic imaging of liver and biliary tract: Secondary | ICD-10-CM | POA: Diagnosis not present

## 2020-01-29 DIAGNOSIS — K851 Biliary acute pancreatitis without necrosis or infection: Secondary | ICD-10-CM | POA: Diagnosis present

## 2020-01-29 DIAGNOSIS — Z79899 Other long term (current) drug therapy: Secondary | ICD-10-CM | POA: Diagnosis not present

## 2020-01-29 DIAGNOSIS — Z980 Intestinal bypass and anastomosis status: Secondary | ICD-10-CM | POA: Diagnosis not present

## 2020-01-29 DIAGNOSIS — M810 Age-related osteoporosis without current pathological fracture: Secondary | ICD-10-CM | POA: Diagnosis present

## 2020-01-29 DIAGNOSIS — Z888 Allergy status to other drugs, medicaments and biological substances status: Secondary | ICD-10-CM | POA: Diagnosis not present

## 2020-01-29 DIAGNOSIS — Z9889 Other specified postprocedural states: Secondary | ICD-10-CM | POA: Diagnosis not present

## 2020-01-29 DIAGNOSIS — Z87442 Personal history of urinary calculi: Secondary | ICD-10-CM | POA: Diagnosis not present

## 2020-01-29 LAB — CBC
HCT: 32.5 % — ABNORMAL LOW (ref 39.0–52.0)
Hemoglobin: 10.4 g/dL — ABNORMAL LOW (ref 13.0–17.0)
MCH: 32.5 pg (ref 26.0–34.0)
MCHC: 32 g/dL (ref 30.0–36.0)
MCV: 101.6 fL — ABNORMAL HIGH (ref 80.0–100.0)
Platelets: 367 10*3/uL (ref 150–400)
RBC: 3.2 MIL/uL — ABNORMAL LOW (ref 4.22–5.81)
RDW: 14.6 % (ref 11.5–15.5)
WBC: 10.8 10*3/uL — ABNORMAL HIGH (ref 4.0–10.5)
nRBC: 0 % (ref 0.0–0.2)

## 2020-01-29 LAB — HIV ANTIBODY (ROUTINE TESTING W REFLEX): HIV Screen 4th Generation wRfx: NONREACTIVE

## 2020-01-29 LAB — SARS CORONAVIRUS 2 BY RT PCR (HOSPITAL ORDER, PERFORMED IN ~~LOC~~ HOSPITAL LAB): SARS Coronavirus 2: NEGATIVE

## 2020-01-29 LAB — GLUCOSE, CAPILLARY
Glucose-Capillary: 80 mg/dL (ref 70–99)
Glucose-Capillary: 83 mg/dL (ref 70–99)
Glucose-Capillary: 79 mg/dL (ref 70–99)

## 2020-01-29 LAB — LACTIC ACID, PLASMA: Lactic Acid, Venous: 1.9 mmol/L (ref 0.5–1.9)

## 2020-01-29 MED ORDER — ACETAMINOPHEN 325 MG PO TABS: 650.0000 mg | ORAL_TABLET | Freq: Four times a day (QID) | ORAL | Status: DC | PRN

## 2020-01-29 MED ORDER — HYDROMORPHONE HCL 1 MG/ML IJ SOLN
1.0000 mg | Freq: Once | INTRAMUSCULAR | Status: AC
  Administered 2020-01-29: 1 mg via INTRAVENOUS
  Filled 2020-01-29: qty 1

## 2020-01-29 MED ORDER — ACETAMINOPHEN 325 MG PO TABS
650.0000 mg | ORAL_TABLET | Freq: Once | ORAL | Status: AC
  Administered 2020-01-29: 650 mg via ORAL
  Filled 2020-01-29: qty 2

## 2020-01-29 MED ORDER — HYDROMORPHONE HCL 1 MG/ML IJ SOLN
1.0000 mg | INTRAMUSCULAR | Status: DC | PRN
  Administered 2020-01-29 (×3): 1 mg via INTRAVENOUS
  Filled 2020-01-29 (×3): qty 1

## 2020-01-29 MED ORDER — POLYETHYLENE GLYCOL 3350 17 G PO PACK: 17.0000 g | PACK | Freq: Every day | ORAL | Status: DC | PRN

## 2020-01-29 MED ORDER — HYDROMORPHONE HCL 1 MG/ML IJ SOLN
0.5000 mg | INTRAMUSCULAR | Status: DC | PRN
  Administered 2020-01-29 (×2): 1 mg via INTRAVENOUS
  Filled 2020-01-29 (×2): qty 1

## 2020-01-29 MED ORDER — SODIUM CHLORIDE 0.9 % IV SOLN: INTRAVENOUS | Status: DC

## 2020-01-29 MED ORDER — INSULIN ASPART 100 UNIT/ML ~~LOC~~ SOLN: 0.0000 [IU] | Freq: Three times a day (TID) | SUBCUTANEOUS | Status: DC

## 2020-01-29 MED ORDER — SODIUM CHLORIDE 0.9 % IV SOLN: Freq: Once | INTRAVENOUS | Status: DC

## 2020-01-29 MED ORDER — ACETAMINOPHEN 650 MG RE SUPP: 650.0000 mg | Freq: Four times a day (QID) | RECTAL | Status: DC | PRN

## 2020-01-29 MED ORDER — ONDANSETRON HCL 4 MG PO TABS: 4.0000 mg | ORAL_TABLET | Freq: Four times a day (QID) | ORAL | Status: DC | PRN

## 2020-01-29 MED ORDER — OXYCODONE HCL 5 MG PO TABS
15.0000 mg | ORAL_TABLET | ORAL | Status: DC | PRN
  Administered 2020-01-29 – 2020-01-30 (×4): 15 mg via ORAL
  Filled 2020-01-29 (×4): qty 3

## 2020-01-29 MED ORDER — BISACODYL 5 MG PO TBEC: 5.0000 mg | DELAYED_RELEASE_TABLET | Freq: Every day | ORAL | Status: DC | PRN

## 2020-01-29 MED ORDER — HYDROMORPHONE HCL 1 MG/ML IJ SOLN
1.0000 mg | INTRAMUSCULAR | Status: DC | PRN
  Administered 2020-01-29 – 2020-01-30 (×8): 1 mg via INTRAVENOUS
  Filled 2020-01-29 (×8): qty 1

## 2020-01-29 MED ORDER — ONDANSETRON HCL 4 MG/2ML IJ SOLN: 4.0000 mg | Freq: Four times a day (QID) | INTRAMUSCULAR | Status: DC | PRN

## 2020-01-29 MED ORDER — ENOXAPARIN SODIUM 40 MG/0.4ML ~~LOC~~ SOLN
40.0000 mg | SUBCUTANEOUS | Status: DC
  Administered 2020-01-29: 40 mg via SUBCUTANEOUS
  Filled 2020-01-29: qty 0.4

## 2020-01-29 MED ORDER — OXYCODONE HCL 5 MG PO TABS
5.0000 mg | ORAL_TABLET | ORAL | Status: DC | PRN
  Administered 2020-01-29: 5 mg via ORAL
  Filled 2020-01-29: qty 1

## 2020-01-29 NOTE — ED Notes (Signed)
Pt transported to MRI 

## 2020-01-29 NOTE — Consult Note (Signed)
Referring Provider:  [facility]         Primary Care Physician:  [name], MD Primary Gastroenterologist:  Dr. Alice Reichert           We were asked to see this patient for:   CBD stone               ASSESSMENT /  PLAN    Justin Patel is a 69 y.o. male PMH significant for, but not necessarily limited to, kidney stones, diverticulitis, Roux-en-Y gastric bypass, hiatal hernia repair, cholelithiasis, HTN, diabetes,  hyperlipidemia, and GERD  # Choledocholithiasis --Surprisingly LFTs are normal --WBC mildly elevated at 13.8 --Mildly elevated lipase but no evidence for pancreatitis on MRCP  --Patient needs ERCP but case complicated by history of Roux-en-Y gastric bypass.  Awaiting callback from our biliary endoscopist to discuss the case.  --In the interim, clear liquids are fine  Addendum: Just communicated with Dr. Ardis Hughs, our biliary endoscopist. Given gastric bypass anatomy he will not be able to proceed with ERCP. Patient will need transfer to tertiary care center.     HPI:    Chief Complaint: abdominal pain  Justin Patel is a 69 y.o. male with PMH as above. He presented to [facility] ED yesterday evening with left-sided flank pain for a week. He was status post lumbar fusion 2 weeks prior.  WBC 13.8, hemoglobin 12.2, LFTs normal, lipase 130, creatinine 1.33. Stone protocol CT scan showed gastric bypass anatomy with moderate hiatal hernia, no renal stones, distended gallbladder with cholelithiasis but no pericholecystic inflammation.  RUQ ultrasound remarkable for cholelithiasis mild intra and more pronounced extrahepatic biliary duct dilation.  Follow-up MRCP showed diffuse biliary duct dilation, abrupt termination of the distal CBD duct near the ampulla suspicious for impacted distal CBD stone.  His LFTs are normal.  No ERCP coverage at [facility], patient transferred here for possible ERCP  Patient gives a two month history of postprandial , stabbing pain in left flank  radiating around to left abdomen into epigastrium. Pain radiates into his back. He has some associated nausea. No significant weight loss. No fevers. Recently had lumbar fusion, pain didn't improve at all and patient says his pain is definitely not related to back / spine. He has chronic constipation secondary to narcotics but manages nicely with stool softeners. Last colonoscopy was with Dr. Alice Reichert in 2019, a 10 year recall was recommended.   PREVIOUS ENDOSCOPIC EVALUATIONS / GI STUDIES : 01/28/20  RUQ US IMPRESSION: Echogenic, shadowing gallstones as well as a larger possible sludge ball/tumefactive sludge.  Gallbladder distention is nonspecific in the absence of wall thickening or a positive sonographic Murphy sign though acute cholecystitis can present with an isolated hydropic appearance of the gallbladder.  Mild intra and more pronounced extrahepatic biliary ductal dilatation. If there is concern for choledocholithiasis, consider MRCP  01/29/20 MRCP  IMPRESSION: 1. Cholelithiasis. No radiographic evidence of acute cholecystitis. 2. Diffuse biliary ductal dilatation. Abrupt termination of the distal common bile duct near the ampulla, with "meniscus sign "which is suspicious for an impacted distal common bile duct stone. Consider ERCP for further evaluation. 3. No radiographic evidence of acute pancreatitis, pancreatic ductal dilatation or pancreas divisum    Past Medical History:  Diagnosis Date  . 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 Anxiety   . Arthritis   . 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  removal scheduled to clear margins.  Marland Kitchen  CHF (congestive heart failure) ([place])    no longer after weight loss  . Chronic kidney disease    cysts  . Degenerative disc disease   . Diabetes mellitus    no longer diabetic-or on meds  . Family history of adverse reaction to anesthesia    sons wake up combative  . Gastric ulcer   .  GERD (gastroesophageal reflux disease)   . H/O hiatal hernia   . Headache(784.0)    sinus  . Heart murmur   . Hx of congestive heart failure   . Hyperlipidemia   . Hypertension   . Iron deficiency anemia 02/28/2015  . Opiate abuse, continuous ([place]) 06/20/2015  . Sacral fracture, closed ([place])   . Seizures ([place])    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, after GI bleed  . Sleep apnea    hx not now since wt loss  . Stones in the urinary tract   . Syncope and collapse     Past Surgical History:  Procedure Laterality Date  . ANTERIOR CERVICAL DECOMP/DISCECTOMY FUSION  01/26/2012   Procedure: ANTERIOR CERVICAL DECOMPRESSION/DISCECTOMY FUSION 3 LEVELS;  Surgeon: Floyce Stakes, MD;  Location: [facility];  Service: Neurosurgery;  Laterality: N/A;  Cervical three-four Cervical four-five Cervical five-six Cervical six-seven , Anterior cervical decompression/diskectomy, fusion, plate  . ANTERIOR LAT LUMBAR FUSION Right 04/25/2018   Procedure: Right Lumbar two-three Lumbar three-four Lumbar four-five anterior  lateral interbody fusion  with posterior percutaneous pedicle screws;  Surgeon: Erline Levine, MD;  Location: [place];  Service: Neurosurgery;  Laterality: Right;  . APPENDECTOMY    . [place], normal  . CARDIAC CATHETERIZATION     [place]  . COLONOSCOPY WITH PROPOFOL N/A 12/27/2017   Procedure: COLONOSCOPY WITH PROPOFOL;  Surgeon: Toledo, Benay Pike, MD;  Location: [facility] ENDOSCOPY;  Service: Gastroenterology;  Laterality: N/A;  . ESOPHAGOGASTRODUODENOSCOPY (EGD) WITH PROPOFOL N/A 12/27/2017   Procedure: ESOPHAGOGASTRODUODENOSCOPY (EGD) WITH PROPOFOL;  Surgeon: Toledo, Benay Pike, MD;  Location: [facility] ENDOSCOPY;  Service: Gastroenterology;  Laterality: N/A;  . GASTRIC BYPASS  2010   [place]  . HERNIA REPAIR  2000   hiatal  . JOINT REPLACEMENT    . KNEE ARTHROSCOPY     bilateral, left x 2  . LAMINECTOMY WITH POSTERIOR LATERAL ARTHRODESIS LEVEL 4  N/A 01/14/2020   Procedure: Decompressive Laminectomy Lumbar One with pedicle screw fixation from Thoracic Ten to Lumbar Two;  Surgeon: Erline Levine, MD;  Location: [place];  Service: Neurosurgery;  Laterality: N/A;  Decompressive Laminectomy Lumbar One with pedicle screw fixation from Thoracic Ten to Lumbar Two  . LUMBAR PERCUTANEOUS PEDICLE SCREW 3 LEVEL N/A 04/25/2018   Procedure: LUMBAR PERCUTANEOUS PEDICLE SCREW 3 LEVEL;  Surgeon: Erline Levine, MD;  Location: [place];  Service: Neurosurgery;  Laterality: N/A;  . NASAL SINUS SURGERY     x5  . OSTEOTOMY  2001   left  . TONSILLECTOMY    . TOTAL KNEE ARTHROPLASTY Left 05/2014   Dr. Marry Guan  . UVULOPALATOPLASTY  2011    Prior to Admission medications   Medication Sig Start Date End Date Taking? Authorizing Provider  amLODipine (NORVASC) 10 MG tablet TAKE ONE TABLET EVERY DAY Patient taking differently: Take 10 mg by mouth daily.  11/20/19   [name], MD  blood glucose meter kit and supplies KIT Dispense based on patient and insurance preference. Check once daily. ICD doing E11.9. 06/30/16   [name], MD  Calcium Carbonate-Vitamin  D (CALCIUM 600/VITAMIN D PO) Take 1 tablet by mouth daily.    [provider]  Cholecalciferol (VITAMIN D) 50 MCG (2000 UT) CAPS Take 4,000 Units by mouth daily.    [provider]  doxycycline (VIBRA-TABS) 100 MG tablet Take 1 tablet (100 mg total) by mouth 2 (two) times daily. Patient not taking: Reported on 01/10/2020 05/22/19   [name], MD  DULoxetine (CYMBALTA) 60 MG capsule TAKE 1 CAPSULE BY MOUTH EVERY DAY Patient taking differently: Take 60 mg by mouth daily.  12/12/19   [name], MD  esomeprazole (NEXIUM) 40 MG capsule TAKE 1 CAPSULE BY MOUTH ONCE DAILY Patient taking differently: Take 40 mg by mouth daily.  12/12/19   [name], MD  furosemide (LASIX) 20 MG tablet TAKE ONE TABLET BY MOUTH EVERY DAY Patient taking differently: Take 20 mg by mouth  daily.  12/12/19   [name], MD  HYDROcodone-acetaminophen (NORCO/VICODIN) 5-325 MG tablet Take 1 tablet by mouth every 6 (six) hours as needed for moderate pain. 11/06/19   Fisher, Linden Dolin, PA-C  methocarbamol (ROBAXIN) 500 MG tablet Take 1 tablet (500 mg total) by mouth every 6 (six) hours as needed for muscle spasms. 01/16/20   Erline Levine, MD  metoprolol succinate (TOPROL-XL) 50 MG 24 hr tablet TAKE 1 TABLET BY MOUTH DAILY WITH OR IMMEDIATLY FOLLOWING A MEAL Patient taking differently: Take 50 mg by mouth daily.  12/12/19   [name], MD  Multiple Vitamin (MULTIVITAMIN) capsule Take 1 capsule by mouth daily. With copper and zinc Patient not taking: Reported on 01/10/2020 07/11/18   [name], MD  nystatin cream (MYCOSTATIN) Apply 1 application topically 2 (two) times daily. Patient not taking: Reported on 01/10/2020 05/03/17   [name], MD  nystatin-triamcinolone ointment [facility]) Apply 1 application topically 2 (two) times daily. Patient not taking: Reported on 01/10/2020 05/08/18   [name], MD  primidone (MYSOLINE) 50 MG tablet Take 1 tablet (50 mg total) by mouth at bedtime. TAKE 1/2 TABLET AT BEDTIME FOR 7 DAYS THEN INCREASE TO TAKE ONE TABLET AT BEDTIME Patient not taking: Reported on 01/29/2020 11/20/19   Pieter Partridge, DO  rosuvastatin (CRESTOR) 40 MG tablet TAKE ONE TABLET EVERY DAY Patient taking differently: Take 40 mg by mouth daily.  10/05/19   Wellington Hampshire, MD  sucralfate (CARAFATE) 1 g tablet TAKE ONE TABLET BY MOUTH FOUR TIMES DAILY Patient taking differently: Take 1 g by mouth 4 (four) times daily.  12/12/19   [name], MD  tamsulosin (FLOMAX) 0.4 MG CAPS capsule TAKE 1 CAPSULE BY MOUTH EVERY DAY Patient taking differently: Take 0.4 mg by mouth daily.  11/20/19   [name], MD  TRADJENTA 5 MG TABS tablet TAKE ONE TABLET BY MOUTH EVERY DAY Patient taking differently: Take 5 mg by mouth daily.  12/12/19   [name], MD   Vitamin D, Ergocalciferol, (DRISDOL) 1.25 MG (50000 UT) CAPS capsule TAKE 1 CAPSULE EVERY 7 DAYS Patient not taking: No sig reported 01/29/19   [name], MD    Current Facility-Administered Medications  Medication Dose Route Frequency Provider Last Rate Last Admin  . 0.9 %  sodium chloride infusion   Intravenous Continuous Nicole Kindred A, DO 100 mL/hr at 01/29/20 1325 New Bag at 01/29/20 1325  . acetaminophen (TYLENOL) tablet 650 mg  650 mg Oral Q6H PRN Nicole Kindred A, DO       Or  . acetaminophen (  TYLENOL) suppository 650 mg  650 mg Rectal Q6H PRN Nicole Kindred A, DO      . bisacodyl (DULCOLAX) EC tablet 5 mg  5 mg Oral Daily PRN Nicole Kindred A, DO      . enoxaparin (LOVENOX) injection 40 mg  40 mg Subcutaneous Q24H Nicole Kindred A, DO      . HYDROmorphone (DILAUDID) injection 0.5-1 mg  0.5-1 mg Intravenous Q2H PRN Nicole Kindred A, DO   1 mg at 01/29/20 1326  . insulin aspart (novoLOG) injection 0-9 Units  0-9 Units Subcutaneous TID WC Nicole Kindred A, DO      . ondansetron (ZOFRAN) tablet 4 mg  4 mg Oral Q6H PRN Nicole Kindred A, DO       Or  . ondansetron (ZOFRAN) injection 4 mg  4 mg Intravenous Q6H PRN Nicole Kindred A, DO      . oxyCODONE (Oxy IR/ROXICODONE) immediate release tablet 5 mg  5 mg Oral Q4H PRN Nicole Kindred A, DO      . polyethylene glycol (MIRALAX / GLYCOLAX) packet 17 g  17 g Oral Daily PRN Nicole Kindred A, DO        Allergies as of 01/29/2020 - Review Complete 01/29/2020  Allergen Reaction Noted  . Altace [ramipril] Anaphylaxis 12/30/2011  . Levaquin [levofloxacin in d5w] Hives 01/25/2012  . Levofloxacin  10/02/2018  . Conray [iothalamate] Hives 11/02/2007  . Lyrica [pregabalin] Other (See Comments) 04/16/2015  . Metformin Diarrhea 05/23/2015  . Trulicity [dulaglutide] Nausea And Vomiting 11/02/2017    Family History  Problem Relation Age of Onset  . Dementia Mother 100  . Osteoporosis Mother   . Heart disease Father 60  .  Diabetes Father   . Lymphoma Sister        lymphoma, stage 4  . Ovarian cancer Sister 59       Ovarian  . Lupus Sister   . Prostate cancer Maternal Uncle   . Skin cancer Maternal Uncle   . Tongue cancer Maternal Uncle        uncle died of heart attack  . Alcoholism Maternal Uncle   . Lung cancer Paternal Aunt        pat aunts x 4 died of lung cancer  . Lung cancer Paternal Aunt   . Lung cancer Paternal Aunt   . Lung cancer Paternal Aunt   . Mesothelioma Cousin        paternal cousin    Social History   Socioeconomic History  . Marital status: Married    Spouse name: Not on file  . Number of children: 2  . Years of education: Not on file  . Highest education level: Not on file  Occupational History  . Not on file  Tobacco Use  . Smoking status: Never Smoker  . Smokeless tobacco: Never Used  Vaping Use  . Vaping Use: Never used  Substance and Sexual Activity  . Alcohol use: No  . Drug use: No  . Sexual activity: Yes    Partners: Female  Other Topics Concern  . Not on file  Social History Narrative   Lives in [place] with wife, [place]. 2 sons, William Hamburger, Lennette Bihari 38.. 4 grandchildren      Work - Retired, previously taught music in [place] - regular diet, limited quantities after gastric bypass      Exercise - no regular, limited by arthritis in knees, occasional water aerobics   Right handed  One story house   Social Determinants of Health   Financial Resource Strain:   . Difficulty of Paying Living Expenses:   Food Insecurity:   . Worried About Charity fundraiser in the Last Year:   . Arboriculturist in the Last Year:   Transportation Needs:   . Film/video editor (Medical):   Marland Kitchen Lack of Transportation (Non-Medical):   Physical Activity: Sufficiently Active  . Days of Exercise per Week: 6 days  . Minutes of Exercise per Session: 60 min  Stress: No Stress Concern Present  . Feeling of Stress : Not at all  Social  Connections:   . Frequency of Communication with Friends and Family:   . Frequency of Social Gatherings with Friends and Family:   . Attends Religious Services:   . Active Member of Clubs or Organizations:   . Attends Archivist Meetings:   Marland Kitchen Marital Status:   Intimate Partner Violence:   . Fear of Current or Ex-Partner:   . Emotionally Abused:   Marland Kitchen Physically Abused:   . Sexually Abused:     Review of Systems: All systems reviewed and negative except where noted in HPI.  Physical Exam: Vital signs in last 24 hours: Temp:  [97.6 F (36.4 C)-98.6 F (37 C)] 98 F (36.7 C) (06/22 1148) Pulse Rate:  [67-102] 74 (06/22 1416) Resp:  [12-19] 17 (06/22 1416) BP: (114-148)/(72-99) 134/75 (06/22 1416) SpO2:  [98 %-100 %] 98 % (06/22 1416) Weight:  [82.1 kg] 82.1 kg (06/22 1148) Last BM Date: 01/29/20 General:   Alert, obese  male in NAD Psych:  Pleasant, cooperative. Normal mood and affect. Eyes:  Pupils equal, sclera clear, no icterus.   Conjunctiva pink. Ears:  Normal auditory acuity. Nose:  No deformity, discharge,  or lesions. Neck:  Supple; no masses Lungs:  Clear throughout to auscultation.   No wheezes, crackles, or rhonchi.  Heart:  Regular rate and rhythm; no murmurs, no lower extremity edema Abdomen:  Soft, non-distended, nontender, BS active, no palp mass   Rectal:  Deferred  Msk:  Symmetrical without gross deformities. . Neurologic:  Alert and  oriented x4;  grossly normal neurologically. Skin:  Intact without significant lesions or rashes.   Intake/Output from previous day: No intake/output data recorded. Intake/Output this shift: Total I/O In: 16.7 [I.V.:16.7] Out: 0   Lab Results: Recent Labs    01/28/20 1922  WBC 13.8*  HGB 12.2*  HCT 36.8*  PLT 498*   BMET Recent Labs    01/28/20 1922  NA 137  K 4.0  CL 103  CO2 22  GLUCOSE 85  BUN 13  CREATININE 1.33*  CALCIUM 9.5   LFT Recent Labs    01/28/20 1922  PROT 7.4  ALBUMIN 3.6    AST 23  ALT 14  ALKPHOS 71  BILITOT 0.6   PT/INR No results for input(s): LABPROT, INR in the last 72 hours. Hepatitis Panel No results for input(s): HEPBSAG, HCVAB, HEPAIGM, HEPBIGM in the last 72 hours.   . CBC Latest Ref Rng & Units 01/28/2020 01/14/2020 11/06/2019  WBC 4.0 - 10.5 K/uL 13.8(H) 7.2 7.3  Hemoglobin 13.0 - 17.0 g/dL 12.2(L) 14.3 12.9(L)  Hematocrit 39 - 52 % 36.8(L) 44.3 39.0  Platelets 150 - 400 K/uL 498(H) 221 145(L)    . CMP Latest Ref Rng & Units 01/28/2020 01/14/2020 11/06/2019  Glucose 70 - 99 mg/dL 85 148(H) 139(H)  BUN 8 - 23 mg/dL 13 12 23   Creatinine  0.61 - 1.24 mg/dL 1.33(H) 1.15 1.37(H)  Sodium 135 - 145 mmol/L 137 138 139  Potassium 3.5 - 5.1 mmol/L 4.0 3.8 4.2  Chloride 98 - 111 mmol/L 103 103 101  CO2 22 - 32 mmol/L 22 26 27   Calcium 8.9 - 10.3 mg/dL 9.5 9.1 8.9  Total Protein 6.5 - 8.1 g/dL 7.4 - 7.1  Total Bilirubin 0.3 - 1.2 mg/dL 0.6 - 0.4  Alkaline Phos 38 - 126 U/L 71 - 89  AST 15 - 41 U/L 23 - 26  ALT 0 - 44 U/L 14 - 34   Studies/Results: MR ABDOMEN MRCP WO CONTRAST  Result Date: 01/29/2020 CLINICAL DATA:  Gallstone pancreatitis. Biliary ductal dilatation on recent ultrasound. EXAM: MRI ABDOMEN WITHOUT CONTRAST  (INCLUDING MRCP) TECHNIQUE: Multiplanar multisequence MR imaging of the abdomen was performed. Heavily T2-weighted images of the biliary and pancreatic ducts were obtained, and three-dimensional MRCP images were rendered by post processing. COMPARISON:  Right upper quadrant ultrasound on 01/29/2020, and noncontrast CT on 01/28/2020 FINDINGS: Lower chest: No acute findings. Hepatobiliary: No masses visualized on this unenhanced exam. A few tiny gallstones are seen. The gallbladder is distended, however there is no evidence of gallbladder wall thickening or pericholecystic inflammatory changes. Diffuse biliary ductal dilatation is seen with common bile duct measuring 13 mm. Abrupt termination of the distal common bile duct is seen near the  ampulla with a "meniscus" sign, best seen on image 3/series 18. This is suspicious for an impacted distal common bile duct stone. Pancreas: No mass or inflammatory process visualized on this unenhanced exam. No evidence of pancreatic ductal dilatation or pancreas divisum. Spleen:  Within normal limits in size. Adrenals/Urinary tract: Multiple renal cysts are seen, left side greater than right, with largest in the left kidney measuring 6 cm. A few of the left renal cysts show T1 hyperintense hemorrhage or proteinaceous fluid. No evidence of renal mass or hydronephrosis. Stomach/Bowel: Previous gastric bypass surgery noted, with small hiatal hernia. Otherwise unremarkable. Vascular/Lymphatic: No pathologically enlarged lymph nodes identified. No evidence of abdominal aortic aneurysm. Other:  None. Musculoskeletal: No suspicious bone lesions identified. Lumbar spine fusion hardware noted. IMPRESSION: 1. Cholelithiasis. No radiographic evidence of acute cholecystitis. 2. Diffuse biliary ductal dilatation. Abrupt termination of the distal common bile duct near the ampulla, with "meniscus sign "which is suspicious for an impacted distal common bile duct stone. Consider ERCP for further evaluation. 3. No radiographic evidence of acute pancreatitis, pancreatic ductal dilatation or pancreas divisum. Electronically Signed   By: Marlaine Hind M.D.   On: 01/29/2020 07:52   MR 3D Recon At Scanner  Result Date: 01/29/2020 CLINICAL DATA:  Gallstone pancreatitis. Biliary ductal dilatation on recent ultrasound. EXAM: MRI ABDOMEN WITHOUT CONTRAST  (INCLUDING MRCP) TECHNIQUE: Multiplanar multisequence MR imaging of the abdomen was performed. Heavily T2-weighted images of the biliary and pancreatic ducts were obtained, and three-dimensional MRCP images were rendered by post processing. COMPARISON:  Right upper quadrant ultrasound on 01/29/2020, and noncontrast CT on 01/28/2020 FINDINGS: Lower chest: No acute findings. Hepatobiliary:  No masses visualized on this unenhanced exam. A few tiny gallstones are seen. The gallbladder is distended, however there is no evidence of gallbladder wall thickening or pericholecystic inflammatory changes. Diffuse biliary ductal dilatation is seen with common bile duct measuring 13 mm. Abrupt termination of the distal common bile duct is seen near the ampulla with a "meniscus" sign, best seen on image 3/series 18. This is suspicious for an impacted distal common bile duct stone. Pancreas: No mass or  inflammatory process visualized on this unenhanced exam. No evidence of pancreatic ductal dilatation or pancreas divisum. Spleen:  Within normal limits in size. Adrenals/Urinary tract: Multiple renal cysts are seen, left side greater than right, with largest in the left kidney measuring 6 cm. A few of the left renal cysts show T1 hyperintense hemorrhage or proteinaceous fluid. No evidence of renal mass or hydronephrosis. Stomach/Bowel: Previous gastric bypass surgery noted, with small hiatal hernia. Otherwise unremarkable. Vascular/Lymphatic: No pathologically enlarged lymph nodes identified. No evidence of abdominal aortic aneurysm. Other:  None. Musculoskeletal: No suspicious bone lesions identified. Lumbar spine fusion hardware noted. IMPRESSION: 1. Cholelithiasis. No radiographic evidence of acute cholecystitis. 2. Diffuse biliary ductal dilatation. Abrupt termination of the distal common bile duct near the ampulla, with "meniscus sign "which is suspicious for an impacted distal common bile duct stone. Consider ERCP for further evaluation. 3. No radiographic evidence of acute pancreatitis, pancreatic ductal dilatation or pancreas divisum. Electronically Signed   By: Marlaine Hind M.D.   On: 01/29/2020 07:52   DG Chest Port 1 View  Result Date: 01/29/2020 CLINICAL DATA:  Left flank pain EXAM: PORTABLE CHEST 1 VIEW COMPARISON:  11/06/2019 FINDINGS: The heart size and mediastinal contours are within normal limits.  Both lungs are clear. The visualized skeletal structures are unremarkable. Incompletely visualized lumbar spinal hardware. IMPRESSION: No active disease. Electronically Signed   By: Ulyses Jarred M.D.   On: 01/29/2020 00:40   CT Renal Stone Study  Result Date: 01/29/2020 CLINICAL DATA:  Left flank pain for 1 week, worse today. Lumbar spinal fusion 2 weeks ago. EXAM: CT ABDOMEN AND PELVIS WITHOUT CONTRAST TECHNIQUE: Multidetector CT imaging of the abdomen and pelvis was performed following the standard protocol without IV contrast. COMPARISON:  Abdominopelvic CT 10/23/2018 FINDINGS: Lower chest: The lung bases are clear. No consolidation or pleural fluid. Tiny subpleural nodules in the right lower lobe are stable and considered benign. Hepatobiliary: No evidence of focal abnormality allowing for lack of IV contrast. Gallbladder is distended. Layering gallstones are noted. No pericholecystic inflammation. There is no biliary dilatation Pancreas: Mild fatty atrophy.  No ductal dilatation or inflammation. Spleen: Normal in size without focal abnormality. Adrenals/Urinary Tract: No adrenal nodule. No hydronephrosis. No renal or ureteral calculi. Mild symmetric perinephric edema that appears similar to prior exam. Again seen bilateral renal cortical atrophy. There are bilateral renal cysts, grossly stable from prior exam, some of which not well-defined in the absence of IV contrast. Ureters are decompressed without stones along the course. Urinary bladder is partially distended. No bladder stone or wall thickening. Stomach/Bowel: Gastric bypass anatomy with moderate hiatal hernia. The excluded gastric remnant is decompressed and unremarkable. The Roux limb is nondilated. Jejunal anastomosis is slightly patulous, as before. There is no evidence of obstruction or bowel inflammation. Appendix not definitively seen, no evidence of appendicitis. Colonic diverticulosis involving the descending and sigmoid colon. No evidence  of diverticulitis. Vascular/Lymphatic: Aortic atherosclerosis. No aortic aneurysm. No enlarged lymph nodes in the abdomen or pelvis. Reproductive: Prominent prostate gland spanning 4.7 cm with central calcifications. Other: No ascites or free air. Tiny fat containing umbilical hernia. Postsurgical change of the upper anterior abdominal wall. Musculoskeletal: Thoracolumbar spinal fusion fixating L1 compression fracture. There are interbody spacers at L2-L3, L3-L4, and L4-L5. There are no adverse features. Expected postsurgical change in the soft tissues. No muscular abnormality to account for flank pain. IMPRESSION: 1. No renal stones or obstructive uropathy. No acute abnormality in the abdomen/pelvis. 2. Gastric bypass anatomy with moderate hiatal  hernia. 3. Distended gallbladder with gallstones, but no pericholecystic inflammation 4. Colonic diverticulosis without diverticulitis. 5. Recent postsurgical change in the thoracolumbar spine without evident complication. Aortic Atherosclerosis (ICD10-I70.0). Electronically Signed   By: Keith Rake M.D.   On: 01/29/2020 00:20   US ABDOMEN LIMITED RUQ  Result Date: 01/29/2020 CLINICAL DATA:  Pancreatitis, gallstones seen on CT. EXAM: ULTRASOUND ABDOMEN LIMITED RIGHT UPPER QUADRANT COMPARISON:  CT 01/28/2020 FINDINGS: Gallbladder: Distended gallbladder is present containing echogenic, posteriorly shadowing gallstones. More intermediate echogenicity structure measuring up to 1.7 cm along the dependent gallbladder wall likely reflecting some tumefactive sludge (30/47). No frank gallbladder wall thickening. Sonographic Percell Miller sign was reportedly negative. Common bile duct: Diameter: Intra and extrahepatic biliary ductal dilatation with the proximal common bile duct measuring up to 11.9 mm, 10 mm distally. No visible calcified stones within the common bile duct though portions of the more distal duct are poorly visualized due to bowel gas. Liver: No focal lesion  identified. Within normal limits in parenchymal echogenicity. Portal vein is patent on color Doppler imaging with normal direction of blood flow towards the liver. Other: None. IMPRESSION: Echogenic, shadowing gallstones as well as a larger possible sludge ball/tumefactive sludge. Gallbladder distention is nonspecific in the absence of wall thickening or a positive sonographic Murphy sign though acute cholecystitis can present with an isolated hydropic appearance of the gallbladder. Mild intra and more pronounced extrahepatic biliary ductal dilatation. If there is concern for choledocholithiasis, consider MRCP. Electronically Signed   By: Lovena Le M.D.   On: 01/29/2020 02:03    Principal Problem:   Choledocholithiasis Active Problems:   Diabetes ([place])   Essential hypertension, benign   Hyperlipidemia   BPH (benign prostatic hyperplasia)   Chronic pain syndrome   Anxiety   GERD (gastroesophageal reflux disease)   CKD (chronic kidney disease) stage 3, GFR 30-59 ml/min    Tye Savoy, NP-C @  01/29/2020, 3:07 PM

## 2020-01-29 NOTE — ED Provider Notes (Deleted)
[facility] Emergency Department Provider Note   ____________________________________________   First MD Initiated Contact with Patient 01/28/20 2319     (approximate)  I have reviewed the triage vital signs and the nursing notes.   HISTORY  Chief Complaint Flank Pain    HPI Justin CREAN is a 69 y.o. male who presents to the ED from home with a chief complaint of left-sided flank pain.  Patient reports waxing/waning left-sided flank pain radiating to his left lower quadrant for the past week.  Recently had lumbar spinal fusion performed 2 weeks ago without complications and is recovering nicely.  Symptoms not associated with nausea, vomiting or urinary symptoms.  Denies fever, cough, chest pain, shortness of breath, diarrhea.  Denies testicular pain or swelling.  Patient with a history of kidney stones and diverticulosis.  Ate steak for Father's Day.  Has had both doses of his COVID-19 vaccination.       Past Medical History:  Diagnosis Date  . 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 Anxiety   . Arthritis   . 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  removal scheduled to clear margins.  . CHF (congestive heart failure) ([place])    no longer after weight loss  . Chronic kidney disease    cysts  . Degenerative disc disease   . Diabetes mellitus    no longer diabetic-or on meds  . Family history of adverse reaction to anesthesia    sons wake up combative  . Gastric ulcer   . GERD (gastroesophageal reflux disease)   . H/O hiatal hernia   . Headache(784.0)    sinus  . Heart murmur   . Hx of congestive heart failure   . Hyperlipidemia   . Hypertension   . Iron deficiency anemia 02/28/2015  . Opiate abuse, continuous ([place]) 06/20/2015  . Sacral fracture, closed ([place])   . Seizures ([place])    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, after GI bleed  . Sleep apnea    hx not now since wt loss  . Stones in the  urinary tract   . Syncope and collapse     Patient Active Problem List   Diagnosis Date Noted  . Choledocholithiasis with obstruction 01/29/2020  . Fracture of L1 vertebra ([place]) 01/14/2020  . Arthralgia 08/17/2019  . Mass of submandibular region 08/17/2019  . Pain of right thumb 08/17/2019  . Numbness of face 05/22/2019  . Chronic night sweats 03/21/2019  . Chronic abdominal pain 02/27/2019  . Muscle weakness 08/24/2018  . Dyspnea on exertion 08/24/2018  . Depression, major, single episode, mild ([place]) 08/24/2018  . Macrocytosis without anemia 07/05/2018  . RLQ abdominal pain 05/18/2018  . CKD (chronic kidney disease) stage 3, GFR 30-59 ml/min 05/08/2018  . Lumbar scoliosis 04/25/2018  . Easy bruising 12/09/2017  . 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 Abnormal weight loss 10/18/2017  . GERD (gastroesophageal reflux disease) 10/18/2017  . Constipation 10/18/2017  . Osteoporosis without current pathological fracture 07/20/2017  . Vitamin D deficiency 07/20/2017  . Rash 03/11/2017  . Seborrheic dermatitis 02/24/2017  . Intertrigo 02/24/2017  . Tachycardia 09/27/2016  . Anxiety 06/25/2016  . Cervical facet syndrome 03/15/2016  . Facet syndrome, lumbar 03/15/2016  . DDD (degenerative disc disease), cervical 03/15/2016  . DDD (degenerative disc disease), lumbar 03/15/2016  . Other iron deficiency anemias 11/27/2015  . Recurrent falls 07/25/2015  . Chronic pain syndrome 04/16/2015  .  Alcohol abuse 10/01/2014  . 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 BPH (benign prostatic hyperplasia) 11/16/2013  . Enlarged prostate 11/08/2013  . Anemia 11/08/2013  . H/O urinary stone 10/05/2013  . Renal cyst 08/27/2013  . Diabetes ([place]) 03/21/2013  . Essential hypertension, benign 03/21/2013  . Hyperlipidemia 03/21/2013  . Obesity 03/21/2013    Past Surgical History:  Procedure Laterality Date  . ANTERIOR CERVICAL DECOMP/DISCECTOMY FUSION  01/26/2012   Procedure: ANTERIOR  CERVICAL DECOMPRESSION/DISCECTOMY FUSION 3 LEVELS;  Surgeon: Floyce Stakes, MD;  Location: [facility];  Service: Neurosurgery;  Laterality: N/A;  Cervical three-four Cervical four-five Cervical five-six Cervical six-seven , Anterior cervical decompression/diskectomy, fusion, plate  . ANTERIOR LAT LUMBAR FUSION Right 04/25/2018   Procedure: Right Lumbar two-three Lumbar three-four Lumbar four-five anterior  lateral interbody fusion  with posterior percutaneous pedicle screws;  Surgeon: Erline Levine, MD;  Location: [place];  Service: Neurosurgery;  Laterality: Right;  . APPENDECTOMY    . [place], normal  . CARDIAC CATHETERIZATION     [place]  . COLONOSCOPY WITH PROPOFOL N/A 12/27/2017   Procedure: COLONOSCOPY WITH PROPOFOL;  Surgeon: Toledo, Benay Pike, MD;  Location: [facility];  Service: Gastroenterology;  Laterality: N/A;  . ESOPHAGOGASTRODUODENOSCOPY (EGD) WITH PROPOFOL N/A 12/27/2017   Procedure: ESOPHAGOGASTRODUODENOSCOPY (EGD) WITH PROPOFOL;  Surgeon: Toledo, Benay Pike, MD;  Location: [facility];  Service: Gastroenterology;  Laterality: N/A;  . GASTRIC BYPASS  2010   [place]  . HERNIA REPAIR  2000   hiatal  . JOINT REPLACEMENT    . KNEE ARTHROSCOPY     bilateral, left x 2  . LAMINECTOMY WITH POSTERIOR LATERAL ARTHRODESIS LEVEL 4 N/A 01/14/2020   Procedure: Decompressive Laminectomy Lumbar One with pedicle screw fixation from Thoracic Ten to Lumbar Two;  Surgeon: Erline Levine, MD;  Location: [place];  Service: Neurosurgery;  Laterality: N/A;  Decompressive Laminectomy Lumbar One with pedicle screw fixation from Thoracic Ten to Lumbar Two  . LUMBAR PERCUTANEOUS PEDICLE SCREW 3 LEVEL N/A 04/25/2018   Procedure: LUMBAR PERCUTANEOUS PEDICLE SCREW 3 LEVEL;  Surgeon: Erline Levine, MD;  Location: [place];  Service: Neurosurgery;  Laterality: N/A;  . NASAL SINUS SURGERY     x5  . OSTEOTOMY  2001   left  . TONSILLECTOMY    . TOTAL KNEE  ARTHROPLASTY Left 05/2014   Dr. Marry Guan  . UVULOPALATOPLASTY  2011    Prior to Admission medications   Medication Sig Start Date End Date Taking? Authorizing Provider  amLODipine (NORVASC) 10 MG tablet TAKE ONE TABLET EVERY DAY Patient taking differently: Take 10 mg by mouth daily.  11/20/19  Yes [name], MD  Calcium Carbonate-Vitamin D (CALCIUM 600/VITAMIN D PO) Take 1 tablet by mouth daily.   Yes [provider]  Cholecalciferol (VITAMIN D) 50 MCG (2000 UT) CAPS Take 4,000 Units by mouth daily.   Yes [provider]  DULoxetine (CYMBALTA) 60 MG capsule TAKE 1 CAPSULE BY MOUTH EVERY DAY Patient taking differently: Take 60 mg by mouth daily.  12/12/19  Yes [name], MD  esomeprazole (NEXIUM) 40 MG capsule TAKE 1 CAPSULE BY MOUTH ONCE DAILY Patient taking differently: Take 40 mg by mouth daily.  12/12/19  Yes [name], MD  furosemide (LASIX) 20 MG tablet TAKE ONE TABLET BY MOUTH EVERY DAY Patient taking differently: Take 20 mg by mouth daily.  12/12/19  Yes [name], MD  HYDROcodone-acetaminophen (NORCO/VICODIN) 5-325 MG tablet Take 1  tablet by mouth every 6 (six) hours as needed for moderate pain. 11/06/19  Yes Fisher, Linden Dolin, PA-C  methocarbamol (ROBAXIN) 500 MG tablet Take 1 tablet (500 mg total) by mouth every 6 (six) hours as needed for muscle spasms. 01/16/20  Yes Erline Levine, MD  metoprolol succinate (TOPROL-XL) 50 MG 24 hr tablet TAKE 1 TABLET BY MOUTH DAILY WITH OR IMMEDIATLY FOLLOWING A MEAL Patient taking differently: Take 50 mg by mouth daily.  12/12/19  Yes [name], MD  rosuvastatin (CRESTOR) 40 MG tablet TAKE ONE TABLET EVERY DAY Patient taking differently: Take 40 mg by mouth daily.  10/05/19  Yes Wellington Hampshire, MD  sucralfate (CARAFATE) 1 g tablet TAKE ONE TABLET BY MOUTH FOUR TIMES DAILY Patient taking differently: Take 1 g by mouth 4 (four) times daily.  12/12/19  Yes [name], MD  tamsulosin (FLOMAX) 0.4  MG CAPS capsule TAKE 1 CAPSULE BY MOUTH EVERY DAY Patient taking differently: Take 0.4 mg by mouth daily.  11/20/19  Yes [name], MD  TRADJENTA 5 MG TABS tablet TAKE ONE TABLET BY MOUTH EVERY DAY Patient taking differently: Take 5 mg by mouth daily.  12/12/19  Yes [name], MD  blood glucose meter kit and supplies KIT Dispense based on patient and insurance preference. Check once daily. ICD doing E11.9. 06/30/16   [name], MD  doxycycline (VIBRA-TABS) 100 MG tablet Take 1 tablet (100 mg total) by mouth 2 (two) times daily. Patient not taking: Reported on 01/10/2020 05/22/19   [name], MD  Multiple Vitamin (MULTIVITAMIN) capsule Take 1 capsule by mouth daily. With copper and zinc Patient not taking: Reported on 01/10/2020 07/11/18   [name], MD  nystatin cream (MYCOSTATIN) Apply 1 application topically 2 (two) times daily. Patient not taking: Reported on 01/10/2020 05/03/17   [name], MD  nystatin-triamcinolone ointment [facility]) Apply 1 application topically 2 (two) times daily. Patient not taking: Reported on 01/10/2020 05/08/18   [name], MD  primidone (MYSOLINE) 50 MG tablet Take 1 tablet (50 mg total) by mouth at bedtime. TAKE 1/2 TABLET AT BEDTIME FOR 7 DAYS THEN INCREASE TO TAKE ONE TABLET AT BEDTIME Patient not taking: Reported on 01/29/2020 11/20/19   Pieter Partridge, DO  Vitamin D, Ergocalciferol, (DRISDOL) 1.25 MG (50000 UT) CAPS capsule TAKE 1 CAPSULE EVERY 7 DAYS Patient not taking: No sig reported 01/29/19   [name], MD    Allergies Altace [ramipril], Levaquin [levofloxacin in d5w], Levofloxacin, Conray [iothalamate], Lyrica [pregabalin], Metformin, and Trulicity [dulaglutide]  Family History  Problem Relation Age of Onset  . Dementia Mother 91  . Osteoporosis Mother   . Heart disease Father 54  . Diabetes Father   . Lymphoma Sister        lymphoma, stage 4  . Ovarian cancer Sister 56       Ovarian  .  Lupus Sister   . Prostate cancer Maternal Uncle   . Skin cancer Maternal Uncle   . Tongue cancer Maternal Uncle        uncle died of heart attack  . Alcoholism Maternal Uncle   . Lung cancer Paternal Aunt        pat aunts x 4 died of lung cancer  . Lung cancer Paternal Aunt   . Lung cancer Paternal Aunt   . Lung cancer Paternal Aunt   . Mesothelioma Cousin        paternal cousin  Social History Social History   Tobacco Use  . Smoking status: Never Smoker  . Smokeless tobacco: Never Used  Vaping Use  . Vaping Use: Never used  Substance Use Topics  . Alcohol use: No  . Drug use: No    Review of Systems  Constitutional: No fever/chills Eyes: No visual changes. ENT: No sore throat. Cardiovascular: Denies chest pain. Respiratory: Denies shortness of breath. Gastrointestinal: Positive for left flank and abdominal pain.  No nausea, no vomiting.  No diarrhea.  No constipation. Genitourinary: Negative for dysuria. Musculoskeletal: Negative for back pain. Skin: Negative for rash. Neurological: Negative for headaches, focal weakness or numbness.   ____________________________________________   PHYSICAL EXAM:  VITAL SIGNS: ED Triage Vitals  Enc Vitals Group     BP 01/28/20 1919 128/86     Pulse Rate 01/28/20 1919 (!) 102     Resp 01/28/20 1919 18     Temp 01/28/20 1919 98.2 F (36.8 C)     Temp Source 01/28/20 1919 Oral     SpO2 01/28/20 1919 98 %     Weight 01/28/20 1922 181 lb (82.1 kg)     Height 01/28/20 1922 _0  (1.702 m)     Head Circumference --      Peak Flow --      Pain Score --      Pain Loc --      Pain Edu? --      Excl. in [place]? --     Constitutional: Alert and oriented. Well appearing and in mild acute distress. Eyes: Conjunctivae are normal. PERRL. EOMI. Head: Atraumatic. Nose: No congestion/rhinnorhea. Mouth/Throat: Mucous membranes are moist.   Neck: No stridor.   Cardiovascular: Normal rate, regular rhythm. Grossly normal heart sounds.   Good peripheral circulation. Respiratory: Normal respiratory effort.  No retractions. Lungs CTAB. Gastrointestinal: Soft and nontender to light or deep palpation. No distention. No abdominal bruits. Mild left CVA tenderness.  Midline surgical scar clean/dry/intact without warmth or erythema. Musculoskeletal: No lower extremity tenderness nor edema.  No joint effusions. Neurologic:  Normal speech and language. No gross focal neurologic deficits are appreciated.  Skin:  Skin is warm, dry and intact. No rash noted. Psychiatric: Mood and affect are normal. Speech and behavior are normal.  ____________________________________________   LABS (all labs ordered are listed, but only abnormal results are displayed)  Labs Reviewed  LIPASE, BLOOD - Abnormal; Notable for the following components:      Result Value   Lipase 130 (*)    All other components within normal limits  COMPREHENSIVE METABOLIC PANEL - Abnormal; Notable for the following components:   Creatinine, Ser 1.33 (*)    GFR calc non Af Amer 55 (*)    All other components within normal limits  CBC - Abnormal; Notable for the following components:   WBC 13.8 (*)    RBC 3.81 (*)    Hemoglobin 12.2 (*)    HCT 36.8 (*)    Platelets 498 (*)    All other components within normal limits  URINALYSIS, COMPLETE (UACMP) WITH MICROSCOPIC - Abnormal; Notable for the following components:   Color, Urine YELLOW (*)    APPearance HAZY (*)    Protein, ur 30 (*)    All other components within normal limits  SARS CORONAVIRUS 2 BY RT PCR (HOSPITAL ORDER, [facility])   ____________________________________________  EKG  ED ECG REPORT I, Yassir Enis J, the attending physician, personally viewed and interpreted this ECG.   Date:  01/28/2020  EKG Time: 0042  Rate: 93  Rhythm: normal EKG, normal sinus rhythm  Axis: Normal  Intervals:none  ST&T Change:  Nonspecific  ____________________________________________  RADIOLOGY  ED MD interpretation: No acute cardiopulmonary process; CT demonstrates no kidney stones, gallstones; ultrasound demonstrates cholelithiasis without cholecystitis; intra and extrahepatic biliary ductal dilatation; MRCP  + choledocholithiasis  Official radiology report(s): DG Chest Port 1 View  Result Date: 01/29/2020 CLINICAL DATA:  Left flank pain EXAM: PORTABLE CHEST 1 VIEW COMPARISON:  11/06/2019 FINDINGS: The heart size and mediastinal contours are within normal limits. Both lungs are clear. The visualized skeletal structures are unremarkable. Incompletely visualized lumbar spinal hardware. IMPRESSION: No active disease. Electronically Signed   By: Ulyses Jarred M.D.   On: 01/29/2020 00:40   CT Renal Stone Study  Result Date: 01/29/2020 CLINICAL DATA:  Left flank pain for 1 week, worse today. Lumbar spinal fusion 2 weeks ago. EXAM: CT ABDOMEN AND PELVIS WITHOUT CONTRAST TECHNIQUE: Multidetector CT imaging of the abdomen and pelvis was performed following the standard protocol without IV contrast. COMPARISON:  Abdominopelvic CT 10/23/2018 FINDINGS: Lower chest: The lung bases are clear. No consolidation or pleural fluid. Tiny subpleural nodules in the right lower lobe are stable and considered benign. Hepatobiliary: No evidence of focal abnormality allowing for lack of IV contrast. Gallbladder is distended. Layering gallstones are noted. No pericholecystic inflammation. There is no biliary dilatation Pancreas: Mild fatty atrophy.  No ductal dilatation or inflammation. Spleen: Normal in size without focal abnormality. Adrenals/Urinary Tract: No adrenal nodule. No hydronephrosis. No renal or ureteral calculi. Mild symmetric perinephric edema that appears similar to prior exam. Again seen bilateral renal cortical atrophy. There are bilateral renal cysts, grossly stable from prior exam, some of which not well-defined in the absence  of IV contrast. Ureters are decompressed without stones along the course. Urinary bladder is partially distended. No bladder stone or wall thickening. Stomach/Bowel: Gastric bypass anatomy with moderate hiatal hernia. The excluded gastric remnant is decompressed and unremarkable. The Roux limb is nondilated. Jejunal anastomosis is slightly patulous, as before. There is no evidence of obstruction or bowel inflammation. Appendix not definitively seen, no evidence of appendicitis. Colonic diverticulosis involving the descending and sigmoid colon. No evidence of diverticulitis. Vascular/Lymphatic: Aortic atherosclerosis. No aortic aneurysm. No enlarged lymph nodes in the abdomen or pelvis. Reproductive: Prominent prostate gland spanning 4.7 cm with central calcifications. Other: No ascites or free air. Tiny fat containing umbilical hernia. Postsurgical change of the upper anterior abdominal wall. Musculoskeletal: Thoracolumbar spinal fusion fixating L1 compression fracture. There are interbody spacers at L2-L3, L3-L4, and L4-L5. There are no adverse features. Expected postsurgical change in the soft tissues. No muscular abnormality to account for flank pain. IMPRESSION: 1. No renal stones or obstructive uropathy. No acute abnormality in the abdomen/pelvis. 2. Gastric bypass anatomy with moderate hiatal hernia. 3. Distended gallbladder with gallstones, but no pericholecystic inflammation 4. Colonic diverticulosis without diverticulitis. 5. Recent postsurgical change in the thoracolumbar spine without evident complication. Aortic Atherosclerosis (ICD10-I70.0). Electronically Signed   By: Keith Rake M.D.   On: 01/29/2020 00:20   US ABDOMEN LIMITED RUQ  Result Date: 01/29/2020 CLINICAL DATA:  Pancreatitis, gallstones seen on CT. EXAM: ULTRASOUND ABDOMEN LIMITED RIGHT UPPER QUADRANT COMPARISON:  CT 01/28/2020 FINDINGS: Gallbladder: Distended gallbladder is present containing echogenic, posteriorly shadowing  gallstones. More intermediate echogenicity structure measuring up to 1.7 cm along the dependent gallbladder wall likely reflecting some tumefactive sludge (30/47). No frank gallbladder wall thickening. Sonographic Percell Miller sign was reportedly negative. Common  bile duct: Diameter: Intra and extrahepatic biliary ductal dilatation with the proximal common bile duct measuring up to 11.9 mm, 10 mm distally. No visible calcified stones within the common bile duct though portions of the more distal duct are poorly visualized due to bowel gas. Liver: No focal lesion identified. Within normal limits in parenchymal echogenicity. Portal vein is patent on color Doppler imaging with normal direction of blood flow towards the liver. Other: None. IMPRESSION: Echogenic, shadowing gallstones as well as a larger possible sludge ball/tumefactive sludge. Gallbladder distention is nonspecific in the absence of wall thickening or a positive sonographic Murphy sign though acute cholecystitis can present with an isolated hydropic appearance of the gallbladder. Mild intra and more pronounced extrahepatic biliary ductal dilatation. If there is concern for choledocholithiasis, consider MRCP. Electronically Signed   By: Lovena Le M.D.   On: 01/29/2020 02:03    ____________________________________________   PROCEDURES  Procedure(s) performed (including Critical Care):  Procedures   ____________________________________________   INITIAL IMPRESSION / ASSESSMENT AND PLAN / ED COURSE  As part of my medical decision making, I reviewed the following data within the [facility] notes reviewed and incorporated, Labs reviewed, EKG interpreted, Old chart reviewed, Radiograph reviewed, Notes from prior ED visits and [place] Controlled Substance Database     AVA DEGUIRE was evaluated in Emergency Department on 01/29/2020 for the symptoms described in the history of present illness. He was evaluated in the  context of the global COVID-19 pandemic, which necessitated consideration that the patient might be at risk for infection with the SARS-CoV-2 virus that causes COVID-19. Institutional protocols and algorithms that pertain to the evaluation of patients at risk for COVID-19 are in a state of rapid change based on information released by regulatory bodies including the CDC and federal and state organizations. These policies and algorithms were followed during the patient's care in the ED.    69 year old male presenting with left flank pain; recent lumbar fusion 2 weeks ago. Differential diagnosis includes, but is not limited to, acute appendicitis, renal colic, testicular torsion, urinary tract infection/pyelonephritis, prostatitis,  epididymitis, diverticulitis, small bowel obstruction or ileus, colitis, abdominal aortic aneurysm, gastroenteritis, hernia, etc.  Laboratory results remarkable for AKI, moderate leukocytosis, elevated lipase.  Will initiate IV fluid hydration, IV Dilaudid for pain, IV Zofran for nausea and proceed with CT renal colic study.   Clinical Course as of Jan 29 547  Tue Jan 29, 2020  0032 Patient given additional IV Dilaudid for pain returning.  Updated him on CT results.  Will obtain RUQ ultrasound.   [JS]  0221 Patient requiring additional rounds of IV Dilaudid as pain is poorly controlled.  Updated him on ultrasound result.  Will discuss with hospitalist services for admission.   [JS]  0256 No GI available for ERCP this week. Will obtain MRCP to evaluate choledocholithiasis. If positive, patient will require transfer to another facility. If negative, will admit to our facility.   [JS]  [phone] Spoke with Dr. Marguerita Merles from radiology regarding patient's MRCP; report in PACS. + for choledocholithiasis with stone in the cystic duct as well as stones in the CBD.  Will call [facility] for transfer.   [JS]  [id number] Patient accepted by Dr. Alcario Drought to [facility]; placed on the waiting list  as [facility] is currently full.  Have ordered maintenance IV fluids and PRN Dilaudid for pain control.  Patient updated and agreeable with plan of care.   [JS]  6606 Bed available at [facility]; patient updated.  Arranging transport.   [JS]    Clinical Course User Index [JS] Paulette Blanch, MD     ____________________________________________   FINAL CLINICAL IMPRESSION(S) / ED DIAGNOSES  Final diagnoses:  Left flank pain  Acute gallstone pancreatitis  Intractable pain  Choledocholithiasis     ED Discharge Orders    None       Note:  This document was prepared using Dragon voice recognition software and may include unintentional dictation errors.   Paulette Blanch, MD 01/29/20 [phone]

## 2020-01-29 NOTE — H&P (Signed)
History and Physical    Justin Patel JQD:[id number] DOB: [birth year] DOA: 01/29/2020  PCP: [name], MD  Patient coming from: home  I have personally briefly reviewed patient's old medical records in [place]  Chief Complaint: abdominal / flank pain  HPI: Justin Patel is a 69 y.o. male with medical history significant of gastric bypass surgery, hypertension, hyperlipidemia, degenerative disc disease status post lumbar fusion 2 weeks ago, type 2 diabetes, GERD, anxiety, and kidney stones presented to [facility] last night due to left-sided flank and abdominal pain.  Patient reports that he is actually had waxing and waning pain for the past couple of months but has become progressive and more constant nature.  He previously thought he had a kidney stone but was not having the dysuria that he typically has with that.  States his pain is worse after eating, ate leftover ribeye steak yesterday for lunch which exacerbated his pain significantly.  He states pain is located at the left flank and wraps around to the left lower quadrant of his abdomen.  He denies any nausea or vomiting, fevers or chills.  Reports 4 episodes of nonbloody diarrhea yesterday in the setting of using stool softeners for opioid-induced constipation, no bowel movement today.  He reports having some recent palpitations, denies history of A. fib or other arrhythmia but does report history of PVCs.  Currently he reports feeling very shaky, worried his blood sugar is low from not eating.  He otherwise denies any recent illnesses including cough or congestion, sore throat, dysuria or urinary frequency, headaches or other recent complaints.    In the ED at [facility], evaluation labs are notable for creatinine 1.33, lipase 130, WBCs 13.8, hemoglobin 12.2, platelets 498.  Urinalysis was negative for infection.  CT abdomen pelvis renal stone protocol showed was negative for stones or obstructive uropathy, gastric bypass anatomy  with moderate hiatal hernia, distended gallbladder with gallstones but no surrounding inflammation (other nonpertinent findings per report).  Right upper quadrant ultrasound showed gallstones, possible sludge ball / tumefactive sludge, and mild intra and more pronounced extrahepatic biliary ductal dilatation.    MRCP was therefore obtained and showed:  1. Cholelithiasis. No radiographic evidence of acute cholecystitis. 2. Diffuse biliary ductal dilatation. Abrupt termination of the distal common bile duct near the ampulla, with "meniscus sign "which is suspicious for an impacted distal common bile duct stone. Consider ERCP for further evaluation. 3. No radiographic evidence of acute pancreatitis, pancreatic ductal dilatation or pancreas divisum.  Given the need for ERCP unable to be performed at [facility], patient was transferred to [facility] for admission.  GI is consulted.   Review of Systems: As per HPI otherwise 10 point review of systems negative.    Past Medical History:  Diagnosis Date  . 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 Anxiety   . Arthritis   . 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  removal scheduled to clear margins.  . CHF (congestive heart failure) ([place])    no longer after weight loss  . Chronic kidney disease    cysts  . Degenerative disc disease   . Diabetes mellitus    no longer diabetic-or on meds  . Family history of adverse reaction to anesthesia    sons wake up combative  . Gastric ulcer   . GERD (gastroesophageal reflux disease)   . H/O hiatal hernia   . Headache(784.0)  sinus  . Heart murmur   . Hx of congestive heart failure   . Hyperlipidemia   . Hypertension   . Iron deficiency anemia 02/28/2015  . Opiate abuse, continuous ([place]) 06/20/2015  . Sacral fracture, closed ([place])   . Seizures ([place])    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, after GI bleed  . Sleep apnea    hx not now since wt loss  .  Stones in the urinary tract   . Syncope and collapse     Past Surgical History:  Procedure Laterality Date  . ANTERIOR CERVICAL DECOMP/DISCECTOMY FUSION  01/26/2012   Procedure: ANTERIOR CERVICAL DECOMPRESSION/DISCECTOMY FUSION 3 LEVELS;  Surgeon: Floyce Stakes, MD;  Location: [facility];  Service: Neurosurgery;  Laterality: N/A;  Cervical three-four Cervical four-five Cervical five-six Cervical six-seven , Anterior cervical decompression/diskectomy, fusion, plate  . ANTERIOR LAT LUMBAR FUSION Right 04/25/2018   Procedure: Right Lumbar two-three Lumbar three-four Lumbar four-five anterior  lateral interbody fusion  with posterior percutaneous pedicle screws;  Surgeon: Erline Levine, MD;  Location: [place];  Service: Neurosurgery;  Laterality: Right;  . APPENDECTOMY    . [place], normal  . CARDIAC CATHETERIZATION     [place]  . COLONOSCOPY WITH PROPOFOL N/A 12/27/2017   Procedure: COLONOSCOPY WITH PROPOFOL;  Surgeon: Toledo, Benay Pike, MD;  Location: [facility] ENDOSCOPY;  Service: Gastroenterology;  Laterality: N/A;  . ESOPHAGOGASTRODUODENOSCOPY (EGD) WITH PROPOFOL N/A 12/27/2017   Procedure: ESOPHAGOGASTRODUODENOSCOPY (EGD) WITH PROPOFOL;  Surgeon: Toledo, Benay Pike, MD;  Location: [facility] ENDOSCOPY;  Service: Gastroenterology;  Laterality: N/A;  . GASTRIC BYPASS  2010   [place]  . HERNIA REPAIR  2000   hiatal  . JOINT REPLACEMENT    . KNEE ARTHROSCOPY     bilateral, left x 2  . LAMINECTOMY WITH POSTERIOR LATERAL ARTHRODESIS LEVEL 4 N/A 01/14/2020   Procedure: Decompressive Laminectomy Lumbar One with pedicle screw fixation from Thoracic Ten to Lumbar Two;  Surgeon: Erline Levine, MD;  Location: [place];  Service: Neurosurgery;  Laterality: N/A;  Decompressive Laminectomy Lumbar One with pedicle screw fixation from Thoracic Ten to Lumbar Two  . LUMBAR PERCUTANEOUS PEDICLE SCREW 3 LEVEL N/A 04/25/2018   Procedure: LUMBAR PERCUTANEOUS PEDICLE SCREW 3  LEVEL;  Surgeon: Erline Levine, MD;  Location: [place];  Service: Neurosurgery;  Laterality: N/A;  . NASAL SINUS SURGERY     x5  . OSTEOTOMY  2001   left  . TONSILLECTOMY    . TOTAL KNEE ARTHROPLASTY Left 05/2014   Dr. Marry Guan  . UVULOPALATOPLASTY  2011     reports that he has never smoked. He has never used smokeless tobacco. He reports that he does not drink alcohol and does not use drugs.  Allergies  Allergen Reactions  . Altace [Ramipril] Anaphylaxis  . Levaquin [Levofloxacin In D5w] Hives  . Levofloxacin   . 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 Lyrica [Pregabalin] Other (See Comments)    Edema  . Metformin Diarrhea    High doses cause diarrhea.   Marland Kitchen Trulicity [Dulaglutide] Nausea And Vomiting    Family History  Problem Relation Age of Onset  . Dementia Mother 30  . Osteoporosis Mother   . Heart disease Father 62  . Diabetes Father   . Lymphoma Sister        lymphoma, stage 4  . Ovarian cancer Sister 71  Ovarian  . Lupus Sister   . Prostate cancer Maternal Uncle   . Skin cancer Maternal Uncle   . Tongue cancer Maternal Uncle        uncle died of heart attack  . Alcoholism Maternal Uncle   . Lung cancer Paternal Aunt        pat aunts x 4 died of lung cancer  . Lung cancer Paternal Aunt   . Lung cancer Paternal Aunt   . Lung cancer Paternal Aunt   . Mesothelioma Cousin        paternal cousin     Prior to Admission medications   Medication Sig Start Date End Date Taking? Authorizing Provider  amLODipine (NORVASC) 10 MG tablet TAKE ONE TABLET EVERY DAY Patient taking differently: Take 10 mg by mouth daily.  11/20/19   [name], MD  blood glucose meter kit and supplies KIT Dispense based on patient and insurance preference. Check once daily. ICD doing E11.9. 06/30/16   [name], MD  Calcium Carbonate-Vitamin D (CALCIUM 600/VITAMIN D PO) Take 1 tablet by mouth daily.     [provider]  Cholecalciferol (VITAMIN D) 50 MCG (2000 UT) CAPS Take 4,000 Units by mouth daily.    [provider]  doxycycline (VIBRA-TABS) 100 MG tablet Take 1 tablet (100 mg total) by mouth 2 (two) times daily. Patient not taking: Reported on 01/10/2020 05/22/19   [name], MD  DULoxetine (CYMBALTA) 60 MG capsule TAKE 1 CAPSULE BY MOUTH EVERY DAY Patient taking differently: Take 60 mg by mouth daily.  12/12/19   [name], MD  esomeprazole (NEXIUM) 40 MG capsule TAKE 1 CAPSULE BY MOUTH ONCE DAILY Patient taking differently: Take 40 mg by mouth daily.  12/12/19   [name], MD  furosemide (LASIX) 20 MG tablet TAKE ONE TABLET BY MOUTH EVERY DAY Patient taking differently: Take 20 mg by mouth daily.  12/12/19   [name], MD  HYDROcodone-acetaminophen (NORCO/VICODIN) 5-325 MG tablet Take 1 tablet by mouth every 6 (six) hours as needed for moderate pain. 11/06/19   Fisher, Linden Dolin, PA-C  methocarbamol (ROBAXIN) 500 MG tablet Take 1 tablet (500 mg total) by mouth every 6 (six) hours as needed for muscle spasms. 01/16/20   Erline Levine, MD  metoprolol succinate (TOPROL-XL) 50 MG 24 hr tablet TAKE 1 TABLET BY MOUTH DAILY WITH OR IMMEDIATLY FOLLOWING A MEAL Patient taking differently: Take 50 mg by mouth daily.  12/12/19   [name], MD  Multiple Vitamin (MULTIVITAMIN) capsule Take 1 capsule by mouth daily. With copper and zinc Patient not taking: Reported on 01/10/2020 07/11/18   [name], MD  nystatin cream (MYCOSTATIN) Apply 1 application topically 2 (two) times daily. Patient not taking: Reported on 01/10/2020 05/03/17   [name], MD  nystatin-triamcinolone ointment [facility]) Apply 1 application topically 2 (two) times daily. Patient not taking: Reported on 01/10/2020 05/08/18   [name], MD  primidone (MYSOLINE) 50 MG tablet Take 1 tablet (50 mg total) by mouth at bedtime. TAKE 1/2 TABLET AT BEDTIME FOR 7 DAYS THEN  INCREASE TO TAKE ONE TABLET AT BEDTIME Patient not taking: Reported on 01/29/2020 11/20/19   Pieter Partridge, DO  rosuvastatin (CRESTOR) 40 MG tablet TAKE ONE TABLET EVERY DAY Patient taking differently: Take 40 mg by mouth daily.  10/05/19   Wellington Hampshire, MD  sucralfate (CARAFATE) 1 g tablet TAKE ONE TABLET BY MOUTH FOUR TIMES DAILY Patient taking differently:  Take 1 g by mouth 4 (four) times daily.  12/12/19   [name], MD  tamsulosin (FLOMAX) 0.4 MG CAPS capsule TAKE 1 CAPSULE BY MOUTH EVERY DAY Patient taking differently: Take 0.4 mg by mouth daily.  11/20/19   [name], MD  TRADJENTA 5 MG TABS tablet TAKE ONE TABLET BY MOUTH EVERY DAY Patient taking differently: Take 5 mg by mouth daily.  12/12/19   [name], MD  Vitamin D, Ergocalciferol, (DRISDOL) 1.25 MG (50000 UT) CAPS capsule TAKE 1 CAPSULE EVERY 7 DAYS Patient not taking: No sig reported 01/29/19   [name], MD    Physical Exam: Vitals:   01/29/20 1148 01/29/20 1416  BP: (!) 147/80 134/75  Pulse: 71 74  Resp: 18 17  Temp: 98 F (36.7 C)   TempSrc: Oral   SpO2: 99% 98%  Weight: 82.1 kg   Height: 5' 7"  (1.702 m)      Constitutional: NAD, calm, comfortable  Eyes: EOMI, lids and conjunctivae normal ENMT: Mucous membranes are moist. Normal dentition.  Hearing grossly normal. Respiratory: CTAB, no wheezing, no crackles. Normal respiratory effort. No accessory muscle use.  Cardiovascular: RRR, no murmurs / rubs / gallops. No extremity edema.  Abdomen: soft with epigastric and right upper quadrant tenderness on deep palpation, unable to elicit tenderness on palpation of the left side abdomen, nondistended, bowel sounds present. Musculoskeletal: no clubbing / cyanosis. No joint deformity upper and lower extremities. Normal muscle tone.  Skin: dry, intact, normal color, normal temperature Neurologic: CN 2-12 grossly intact. Normal speech.  Grossly non-focal exam. Psychiatric: Alert and oriented  x 3. Normal mood. Congruent affect.  Normal judgement and insight.   Labs on Admission: I have personally reviewed following labs and imaging studies  CBC: Recent Labs  Lab 01/28/20 1922  WBC 13.8*  HGB 12.2*  HCT 36.8*  MCV 96.6  PLT 209*   Basic Metabolic Panel: Recent Labs  Lab 01/28/20 1922  NA 137  K 4.0  CL 103  CO2 22  GLUCOSE 85  BUN 13  CREATININE 1.33*  CALCIUM 9.5   GFR: Estimated Creatinine Clearance: 54.5 mL/min (A) (by C-G formula based on SCr of 1.33 mg/dL (H)). Liver Function Tests: Recent Labs  Lab 01/28/20 1922  AST 23  ALT 14  ALKPHOS 71  BILITOT 0.6  PROT 7.4  ALBUMIN 3.6   Recent Labs  Lab 01/28/20 1922  LIPASE 130*   No results for input(s): AMMONIA in the last 168 hours. Coagulation Profile: No results for input(s): INR, PROTIME in the last 168 hours. Cardiac Enzymes: No results for input(s): CKTOTAL, CKMB, CKMBINDEX, TROPONINI in the last 168 hours. BNP (last 3 results) No results for input(s): PROBNP in the last 8760 hours. HbA1C: No results for input(s): HGBA1C in the last 72 hours. CBG: Recent Labs  Lab 01/29/20 1330 01/29/20 1524  GLUCAP 80 83   Lipid Profile: No results for input(s): CHOL, HDL, LDLCALC, TRIG, CHOLHDL, LDLDIRECT in the last 72 hours. Thyroid Function Tests: No results for input(s): TSH, T4TOTAL, FREET4, T3FREE, THYROIDAB in the last 72 hours. Anemia Panel: No results for input(s): VITAMINB12, FOLATE, FERRITIN, TIBC, IRON, RETICCTPCT in the last 72 hours. Urine analysis:    Component Value Date/Time   COLORURINE YELLOW (A) 01/28/2020 1922   APPEARANCEUR HAZY (A) 01/28/2020 1922   APPEARANCEUR Clear 05/01/2014 0928   LABSPEC 1.026 01/28/2020 1922   LABSPEC 1.031 05/01/2014 0928   PHURINE 5.0 01/28/2020 1922   GLUCOSEU NEGATIVE 01/28/2020  1922   GLUCOSEU Negative 05/01/2014 0928   HGBUR NEGATIVE 01/28/2020 1922   BILIRUBINUR NEGATIVE 01/28/2020 1922   BILIRUBINUR Negative 05/01/2014 [place] 01/28/2020 1922   PROTEINUR 30 (A) 01/28/2020 1922   UROBILINOGEN 0.2 08/27/2013 0948   NITRITE NEGATIVE 01/28/2020 1922   LEUKOCYTESUR NEGATIVE 01/28/2020 1922   LEUKOCYTESUR Negative 05/01/2014 0928    Radiological Exams on Admission: MR ABDOMEN MRCP WO CONTRAST  Result Date: 01/29/2020 CLINICAL DATA:  Gallstone pancreatitis. Biliary ductal dilatation on recent ultrasound. EXAM: MRI ABDOMEN WITHOUT CONTRAST  (INCLUDING MRCP) TECHNIQUE: Multiplanar multisequence MR imaging of the abdomen was performed. Heavily T2-weighted images of the biliary and pancreatic ducts were obtained, and three-dimensional MRCP images were rendered by post processing. COMPARISON:  Right upper quadrant ultrasound on 01/29/2020, and noncontrast CT on 01/28/2020 FINDINGS: Lower chest: No acute findings. Hepatobiliary: No masses visualized on this unenhanced exam. A few tiny gallstones are seen. The gallbladder is distended, however there is no evidence of gallbladder wall thickening or pericholecystic inflammatory changes. Diffuse biliary ductal dilatation is seen with common bile duct measuring 13 mm. Abrupt termination of the distal common bile duct is seen near the ampulla with a "meniscus" sign, best seen on image 3/series 18. This is suspicious for an impacted distal common bile duct stone. Pancreas: No mass or inflammatory process visualized on this unenhanced exam. No evidence of pancreatic ductal dilatation or pancreas divisum. Spleen:  Within normal limits in size. Adrenals/Urinary tract: Multiple renal cysts are seen, left side greater than right, with largest in the left kidney measuring 6 cm. A few of the left renal cysts show T1 hyperintense hemorrhage or proteinaceous fluid. No evidence of renal mass or hydronephrosis. Stomach/Bowel: Previous gastric bypass surgery noted, with small hiatal hernia. Otherwise unremarkable. Vascular/Lymphatic: No pathologically enlarged lymph nodes identified. No  evidence of abdominal aortic aneurysm. Other:  None. Musculoskeletal: No suspicious bone lesions identified. Lumbar spine fusion hardware noted. IMPRESSION: 1. Cholelithiasis. No radiographic evidence of acute cholecystitis. 2. Diffuse biliary ductal dilatation. Abrupt termination of the distal common bile duct near the ampulla, with "meniscus sign "which is suspicious for an impacted distal common bile duct stone. Consider ERCP for further evaluation. 3. No radiographic evidence of acute pancreatitis, pancreatic ductal dilatation or pancreas divisum. Electronically Signed   By: Marlaine Hind M.D.   On: 01/29/2020 07:52   MR 3D Recon At Scanner  Result Date: 01/29/2020 CLINICAL DATA:  Gallstone pancreatitis. Biliary ductal dilatation on recent ultrasound. EXAM: MRI ABDOMEN WITHOUT CONTRAST  (INCLUDING MRCP) TECHNIQUE: Multiplanar multisequence MR imaging of the abdomen was performed. Heavily T2-weighted images of the biliary and pancreatic ducts were obtained, and three-dimensional MRCP images were rendered by post processing. COMPARISON:  Right upper quadrant ultrasound on 01/29/2020, and noncontrast CT on 01/28/2020 FINDINGS: Lower chest: No acute findings. Hepatobiliary: No masses visualized on this unenhanced exam. A few tiny gallstones are seen. The gallbladder is distended, however there is no evidence of gallbladder wall thickening or pericholecystic inflammatory changes. Diffuse biliary ductal dilatation is seen with common bile duct measuring 13 mm. Abrupt termination of the distal common bile duct is seen near the ampulla with a "meniscus" sign, best seen on image 3/series 18. This is suspicious for an impacted distal common bile duct stone. Pancreas: No mass or inflammatory process visualized on this unenhanced exam. No evidence of pancreatic ductal dilatation or pancreas divisum. Spleen:  Within normal limits in size. Adrenals/Urinary tract: Multiple renal cysts are seen, left side greater than  right,  with largest in the left kidney measuring 6 cm. A few of the left renal cysts show T1 hyperintense hemorrhage or proteinaceous fluid. No evidence of renal mass or hydronephrosis. Stomach/Bowel: Previous gastric bypass surgery noted, with small hiatal hernia. Otherwise unremarkable. Vascular/Lymphatic: No pathologically enlarged lymph nodes identified. No evidence of abdominal aortic aneurysm. Other:  None. Musculoskeletal: No suspicious bone lesions identified. Lumbar spine fusion hardware noted. IMPRESSION: 1. Cholelithiasis. No radiographic evidence of acute cholecystitis. 2. Diffuse biliary ductal dilatation. Abrupt termination of the distal common bile duct near the ampulla, with "meniscus sign "which is suspicious for an impacted distal common bile duct stone. Consider ERCP for further evaluation. 3. No radiographic evidence of acute pancreatitis, pancreatic ductal dilatation or pancreas divisum. Electronically Signed   By: Marlaine Hind M.D.   On: 01/29/2020 07:52   DG Chest Port 1 View  Result Date: 01/29/2020 CLINICAL DATA:  Left flank pain EXAM: PORTABLE CHEST 1 VIEW COMPARISON:  11/06/2019 FINDINGS: The heart size and mediastinal contours are within normal limits. Both lungs are clear. The visualized skeletal structures are unremarkable. Incompletely visualized lumbar spinal hardware. IMPRESSION: No active disease. Electronically Signed   By: Ulyses Jarred M.D.   On: 01/29/2020 00:40   CT Renal Stone Study  Result Date: 01/29/2020 CLINICAL DATA:  Left flank pain for 1 week, worse today. Lumbar spinal fusion 2 weeks ago. EXAM: CT ABDOMEN AND PELVIS WITHOUT CONTRAST TECHNIQUE: Multidetector CT imaging of the abdomen and pelvis was performed following the standard protocol without IV contrast. COMPARISON:  Abdominopelvic CT 10/23/2018 FINDINGS: Lower chest: The lung bases are clear. No consolidation or pleural fluid. Tiny subpleural nodules in the right lower lobe are stable and considered benign.  Hepatobiliary: No evidence of focal abnormality allowing for lack of IV contrast. Gallbladder is distended. Layering gallstones are noted. No pericholecystic inflammation. There is no biliary dilatation Pancreas: Mild fatty atrophy.  No ductal dilatation or inflammation. Spleen: Normal in size without focal abnormality. Adrenals/Urinary Tract: No adrenal nodule. No hydronephrosis. No renal or ureteral calculi. Mild symmetric perinephric edema that appears similar to prior exam. Again seen bilateral renal cortical atrophy. There are bilateral renal cysts, grossly stable from prior exam, some of which not well-defined in the absence of IV contrast. Ureters are decompressed without stones along the course. Urinary bladder is partially distended. No bladder stone or wall thickening. Stomach/Bowel: Gastric bypass anatomy with moderate hiatal hernia. The excluded gastric remnant is decompressed and unremarkable. The Roux limb is nondilated. Jejunal anastomosis is slightly patulous, as before. There is no evidence of obstruction or bowel inflammation. Appendix not definitively seen, no evidence of appendicitis. Colonic diverticulosis involving the descending and sigmoid colon. No evidence of diverticulitis. Vascular/Lymphatic: Aortic atherosclerosis. No aortic aneurysm. No enlarged lymph nodes in the abdomen or pelvis. Reproductive: Prominent prostate gland spanning 4.7 cm with central calcifications. Other: No ascites or free air. Tiny fat containing umbilical hernia. Postsurgical change of the upper anterior abdominal wall. Musculoskeletal: Thoracolumbar spinal fusion fixating L1 compression fracture. There are interbody spacers at L2-L3, L3-L4, and L4-L5. There are no adverse features. Expected postsurgical change in the soft tissues. No muscular abnormality to account for flank pain. IMPRESSION: 1. No renal stones or obstructive uropathy. No acute abnormality in the abdomen/pelvis. 2. Gastric bypass anatomy with  moderate hiatal hernia. 3. Distended gallbladder with gallstones, but no pericholecystic inflammation 4. Colonic diverticulosis without diverticulitis. 5. Recent postsurgical change in the thoracolumbar spine without evident complication. Aortic Atherosclerosis (ICD10-I70.0). Electronically Signed  By: Keith Rake M.D.   On: 01/29/2020 00:20   US ABDOMEN LIMITED RUQ  Result Date: 01/29/2020 CLINICAL DATA:  Pancreatitis, gallstones seen on CT. EXAM: ULTRASOUND ABDOMEN LIMITED RIGHT UPPER QUADRANT COMPARISON:  CT 01/28/2020 FINDINGS: Gallbladder: Distended gallbladder is present containing echogenic, posteriorly shadowing gallstones. More intermediate echogenicity structure measuring up to 1.7 cm along the dependent gallbladder wall likely reflecting some tumefactive sludge (30/47). No frank gallbladder wall thickening. Sonographic Percell Miller sign was reportedly negative. Common bile duct: Diameter: Intra and extrahepatic biliary ductal dilatation with the proximal common bile duct measuring up to 11.9 mm, 10 mm distally. No visible calcified stones within the common bile duct though portions of the more distal duct are poorly visualized due to bowel gas. Liver: No focal lesion identified. Within normal limits in parenchymal echogenicity. Portal vein is patent on color Doppler imaging with normal direction of blood flow towards the liver. Other: None. IMPRESSION: Echogenic, shadowing gallstones as well as a larger possible sludge ball/tumefactive sludge. Gallbladder distention is nonspecific in the absence of wall thickening or a positive sonographic Murphy sign though acute cholecystitis can present with an isolated hydropic appearance of the gallbladder. Mild intra and more pronounced extrahepatic biliary ductal dilatation. If there is concern for choledocholithiasis, consider MRCP. Electronically Signed   By: Lovena Le M.D.   On: 01/29/2020 02:03     Assessment/Plan Principal Problem:    Choledocholithiasis Active Problems:   Diabetes ([place])   Essential hypertension, benign   CKD (chronic kidney disease) stage 3, GFR 30-59 ml/min   Hyperlipidemia   BPH (benign prostatic hyperplasia)   Chronic pain syndrome   Anxiety   GERD (gastroesophageal reflux disease)   History of gastric bypass    Choledocholithiasis - present on admission.  With associated mild pancreatitis, lipase 130.  No evidence of cholecystitis on imaging.  Gastroenterology is consulted.  Due to patient's gastric bypass anatomy, we are unable to perform ERCP here.  Have reached out to [facility] and [facility] so far and awaiting callback from both.  Transfer to [facility] would be preferable as that is where patient had his gastric bypass surgery. --Clear liquid diet --Pain management & bowel regimen --IV fluids --Transfer in progress  Type 2 Diabetes -recent A1c 7.0%.  [place] Ly, held.  Sensitive sliding scale NovoLog and CBGs.   Essential hypertension -chronic, stable.  Continue amlodipine, metoprolol.  Hold Lasix for now.   CKD stage 3a -presented with creatinine 1.33, baseline appears to be around 1.2.  Monitor renal function. Avoid nephrotoxins and hypotension.   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  No acute issues related to this aside from pain.  Continue pain management bowel regimen.  Anxiety -chronic, stable  GERD -takes Nexium, will substitute Protonix.  History of gastric bypass -no acute issues, but gastric bypass anatomy requires transfer to tertiary care center for ERCP.   DVT prophylaxis: enoxaparin (LOVENOX) injection 40 mg Start: 01/29/20 2200   Code Status: Full Family Communication: Wife at bedside during encounter Disposition Plan: Will require transfer to tertiary care center where ERCP can be performed in the setting of gastric  bypass anatomy.  Calls made to [facility] and [facility] so far, awaiting callback's. Consults called: Gastroenterology  Admission status:  Status is: Inpatient  Remains inpatient appropriate because:Inpatient level of care appropriate due to severity of illness   Dispo: The  patient is from: Home              Anticipated d/c is to: Transfer to tertiary center, pending              Anticipated d/c date is: 2 days              Patient currently is not medically stable to d/c.         Ezekiel Slocumb, DO [facility]  01/29/2020, 4:51 PM    If 7PM-7AM, please contact night-coverage. How to contact the [facility] Attending or Consulting provider [place] or covering provider during after hours [place], for this patient?    1. Check the care team in [facility] and look for a) attending/consulting [facility] provider listed and b) the [facility] team listed 2. Log into [email or web address] and use [facility] universal password to access. If you do not have the password, please contact the hospital operator. 3. Locate the [facility] provider you are looking for under [facility] and page to a number that you can be directly reached. 4. If you still have difficulty reaching the provider, please page the [facility] (Director on Call) for the Hospitalists listed on amion for assistance.

## 2020-01-29 NOTE — Progress Notes (Signed)
Pt c/o increased shaking uncontrollably, starting within the last 30 minutes. Temp 98.0, BP slightly elevated, rest of VS wnl. Pt denies feeling cold or feverish. Notified Dr. Hal Hope. See new orders.

## 2020-01-29 NOTE — Plan of Care (Addendum)
Hospitalist note from [facility]:  Called by EDP for transfer: 69 yo M with choledocholithiasis, MRCP demonstrates stone in distal CBD with dilation of duct to up to 62mm!  Their GI doc that does ERCP is out this week, so pt needs transfer to [facility].  Call GI in AM here.  Note, may be delay in transfer due to no beds available.  Update: apparently 1 med-surg bed at [facility] is available so pt will be xfered to [facility].  Call GI in AM after she gets there.

## 2020-01-29 NOTE — ED Notes (Signed)
Pt to the er for left side flank pain. Pt reports he has several kidney stones and the pain is severe. Pt reports a recent back surgery. Pt is not vomiting at this time and appears calm. IV obtained, meds given and pt to CT.

## 2020-01-29 NOTE — ED Provider Notes (Signed)
[facility] Emergency Department Provider Note   ____________________________________________   First MD Initiated Contact with Patient 01/28/20 2319     (approximate)  I have reviewed the triage vital signs and the nursing notes.   HISTORY  Chief Complaint Flank Pain    HPI Justin Patel is a 69 y.o. male who presents to the ED from home with a chief complaint of left-sided flank pain.  Patient reports waxing/waning left-sided flank pain radiating to his left lower quadrant for the past week.  Recently had lumbar spinal fusion performed 2 weeks ago without complications and is recovering nicely.  Symptoms not associated with nausea, vomiting or urinary symptoms.  Denies fever, cough, chest pain, shortness of breath, diarrhea.  Denies testicular pain or swelling.  Patient with a history of kidney stones and diverticulosis.  Ate steak for Father's Day.  Has had both doses of his COVID-19 vaccination.       Past Medical History:  Diagnosis Date  . 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 Anxiety   . Arthritis   . 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  removal scheduled to clear margins.  . CHF (congestive heart failure) ([place])    no longer after weight loss  . Chronic kidney disease    cysts  . Degenerative disc disease   . Diabetes mellitus    no longer diabetic-or on meds  . Family history of adverse reaction to anesthesia    sons wake up combative  . Gastric ulcer   . GERD (gastroesophageal reflux disease)   . H/O hiatal hernia   . Headache(784.0)    sinus  . Heart murmur   . Hx of congestive heart failure   . Hyperlipidemia   . Hypertension   . Iron deficiency anemia 02/28/2015  . Opiate abuse, continuous ([place]) 06/20/2015  . Sacral fracture, closed ([place])   . Seizures ([place])    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, after GI bleed  . Sleep apnea    hx not now since wt loss  . Stones in the  urinary tract   . Syncope and collapse     Patient Active Problem List   Diagnosis Date Noted  . Choledocholithiasis with obstruction 01/29/2020  . Fracture of L1 vertebra ([place]) 01/14/2020  . Arthralgia 08/17/2019  . Mass of submandibular region 08/17/2019  . Pain of right thumb 08/17/2019  . Numbness of face 05/22/2019  . Chronic night sweats 03/21/2019  . Chronic abdominal pain 02/27/2019  . Muscle weakness 08/24/2018  . Dyspnea on exertion 08/24/2018  . Depression, major, single episode, mild ([place]) 08/24/2018  . Macrocytosis without anemia 07/05/2018  . RLQ abdominal pain 05/18/2018  . CKD (chronic kidney disease) stage 3, GFR 30-59 ml/min 05/08/2018  . Lumbar scoliosis 04/25/2018  . Easy bruising 12/09/2017  . 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 Abnormal weight loss 10/18/2017  . GERD (gastroesophageal reflux disease) 10/18/2017  . Constipation 10/18/2017  . Osteoporosis without current pathological fracture 07/20/2017  . Vitamin D deficiency 07/20/2017  . Rash 03/11/2017  . Seborrheic dermatitis 02/24/2017  . Intertrigo 02/24/2017  . Tachycardia 09/27/2016  . Anxiety 06/25/2016  . Cervical facet syndrome 03/15/2016  . Facet syndrome, lumbar 03/15/2016  . DDD (degenerative disc disease), cervical 03/15/2016  . DDD (degenerative disc disease), lumbar 03/15/2016  . Other iron deficiency anemias 11/27/2015  . Recurrent falls 07/25/2015  . Chronic pain syndrome 04/16/2015  .  Alcohol abuse 10/01/2014  . 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 BPH (benign prostatic hyperplasia) 11/16/2013  . Enlarged prostate 11/08/2013  . Anemia 11/08/2013  . H/O urinary stone 10/05/2013  . Renal cyst 08/27/2013  . Diabetes ([place]) 03/21/2013  . Essential hypertension, benign 03/21/2013  . Hyperlipidemia 03/21/2013  . Obesity 03/21/2013    Past Surgical History:  Procedure Laterality Date  . ANTERIOR CERVICAL DECOMP/DISCECTOMY FUSION  01/26/2012   Procedure: ANTERIOR  CERVICAL DECOMPRESSION/DISCECTOMY FUSION 3 LEVELS;  Surgeon: Floyce Stakes, MD;  Location: [facility];  Service: Neurosurgery;  Laterality: N/A;  Cervical three-four Cervical four-five Cervical five-six Cervical six-seven , Anterior cervical decompression/diskectomy, fusion, plate  . ANTERIOR LAT LUMBAR FUSION Right 04/25/2018   Procedure: Right Lumbar two-three Lumbar three-four Lumbar four-five anterior  lateral interbody fusion  with posterior percutaneous pedicle screws;  Surgeon: Erline Levine, MD;  Location: [place];  Service: Neurosurgery;  Laterality: Right;  . APPENDECTOMY    . [place], normal  . CARDIAC CATHETERIZATION     [place]  . COLONOSCOPY WITH PROPOFOL N/A 12/27/2017   Procedure: COLONOSCOPY WITH PROPOFOL;  Surgeon: Toledo, Benay Pike, MD;  Location: [facility];  Service: Gastroenterology;  Laterality: N/A;  . ESOPHAGOGASTRODUODENOSCOPY (EGD) WITH PROPOFOL N/A 12/27/2017   Procedure: ESOPHAGOGASTRODUODENOSCOPY (EGD) WITH PROPOFOL;  Surgeon: Toledo, Benay Pike, MD;  Location: [facility];  Service: Gastroenterology;  Laterality: N/A;  . GASTRIC BYPASS  2010   [place]  . HERNIA REPAIR  2000   hiatal  . JOINT REPLACEMENT    . KNEE ARTHROSCOPY     bilateral, left x 2  . LAMINECTOMY WITH POSTERIOR LATERAL ARTHRODESIS LEVEL 4 N/A 01/14/2020   Procedure: Decompressive Laminectomy Lumbar One with pedicle screw fixation from Thoracic Ten to Lumbar Two;  Surgeon: Erline Levine, MD;  Location: [place];  Service: Neurosurgery;  Laterality: N/A;  Decompressive Laminectomy Lumbar One with pedicle screw fixation from Thoracic Ten to Lumbar Two  . LUMBAR PERCUTANEOUS PEDICLE SCREW 3 LEVEL N/A 04/25/2018   Procedure: LUMBAR PERCUTANEOUS PEDICLE SCREW 3 LEVEL;  Surgeon: Erline Levine, MD;  Location: [place];  Service: Neurosurgery;  Laterality: N/A;  . NASAL SINUS SURGERY     x5  . OSTEOTOMY  2001   left  . TONSILLECTOMY    . TOTAL KNEE  ARTHROPLASTY Left 05/2014   Dr. Marry Guan  . UVULOPALATOPLASTY  2011    Prior to Admission medications   Medication Sig Start Date End Date Taking? Authorizing Provider  amLODipine (NORVASC) 10 MG tablet TAKE ONE TABLET EVERY DAY Patient taking differently: Take 10 mg by mouth daily.  11/20/19  Yes [name], MD  Calcium Carbonate-Vitamin D (CALCIUM 600/VITAMIN D PO) Take 1 tablet by mouth daily.   Yes [provider]  Cholecalciferol (VITAMIN D) 50 MCG (2000 UT) CAPS Take 4,000 Units by mouth daily.   Yes [provider]  DULoxetine (CYMBALTA) 60 MG capsule TAKE 1 CAPSULE BY MOUTH EVERY DAY Patient taking differently: Take 60 mg by mouth daily.  12/12/19  Yes [name], MD  esomeprazole (NEXIUM) 40 MG capsule TAKE 1 CAPSULE BY MOUTH ONCE DAILY Patient taking differently: Take 40 mg by mouth daily.  12/12/19  Yes [name], MD  furosemide (LASIX) 20 MG tablet TAKE ONE TABLET BY MOUTH EVERY DAY Patient taking differently: Take 20 mg by mouth daily.  12/12/19  Yes [name], MD  HYDROcodone-acetaminophen (NORCO/VICODIN) 5-325 MG tablet Take 1  tablet by mouth every 6 (six) hours as needed for moderate pain. 11/06/19  Yes Fisher, Linden Dolin, PA-C  methocarbamol (ROBAXIN) 500 MG tablet Take 1 tablet (500 mg total) by mouth every 6 (six) hours as needed for muscle spasms. 01/16/20  Yes Erline Levine, MD  metoprolol succinate (TOPROL-XL) 50 MG 24 hr tablet TAKE 1 TABLET BY MOUTH DAILY WITH OR IMMEDIATLY FOLLOWING A MEAL Patient taking differently: Take 50 mg by mouth daily.  12/12/19  Yes [name], MD  rosuvastatin (CRESTOR) 40 MG tablet TAKE ONE TABLET EVERY DAY Patient taking differently: Take 40 mg by mouth daily.  10/05/19  Yes Wellington Hampshire, MD  sucralfate (CARAFATE) 1 g tablet TAKE ONE TABLET BY MOUTH FOUR TIMES DAILY Patient taking differently: Take 1 g by mouth 4 (four) times daily.  12/12/19  Yes [name], MD  tamsulosin (FLOMAX) 0.4  MG CAPS capsule TAKE 1 CAPSULE BY MOUTH EVERY DAY Patient taking differently: Take 0.4 mg by mouth daily.  11/20/19  Yes [name], MD  TRADJENTA 5 MG TABS tablet TAKE ONE TABLET BY MOUTH EVERY DAY Patient taking differently: Take 5 mg by mouth daily.  12/12/19  Yes [name], MD  blood glucose meter kit and supplies KIT Dispense based on patient and insurance preference. Check once daily. ICD doing E11.9. 06/30/16   [name], MD  doxycycline (VIBRA-TABS) 100 MG tablet Take 1 tablet (100 mg total) by mouth 2 (two) times daily. Patient not taking: Reported on 01/10/2020 05/22/19   [name], MD  Multiple Vitamin (MULTIVITAMIN) capsule Take 1 capsule by mouth daily. With copper and zinc Patient not taking: Reported on 01/10/2020 07/11/18   [name], MD  nystatin cream (MYCOSTATIN) Apply 1 application topically 2 (two) times daily. Patient not taking: Reported on 01/10/2020 05/03/17   [name], MD  nystatin-triamcinolone ointment [facility]) Apply 1 application topically 2 (two) times daily. Patient not taking: Reported on 01/10/2020 05/08/18   [name], MD  primidone (MYSOLINE) 50 MG tablet Take 1 tablet (50 mg total) by mouth at bedtime. TAKE 1/2 TABLET AT BEDTIME FOR 7 DAYS THEN INCREASE TO TAKE ONE TABLET AT BEDTIME Patient not taking: Reported on 01/29/2020 11/20/19   Pieter Partridge, DO  Vitamin D, Ergocalciferol, (DRISDOL) 1.25 MG (50000 UT) CAPS capsule TAKE 1 CAPSULE EVERY 7 DAYS Patient not taking: No sig reported 01/29/19   [name], MD    Allergies Altace [ramipril], Levaquin [levofloxacin in d5w], Levofloxacin, Conray [iothalamate], Lyrica [pregabalin], Metformin, and Trulicity [dulaglutide]  Family History  Problem Relation Age of Onset  . Dementia Mother 91  . Osteoporosis Mother   . Heart disease Father 54  . Diabetes Father   . Lymphoma Sister        lymphoma, stage 4  . Ovarian cancer Sister 56       Ovarian  .  Lupus Sister   . Prostate cancer Maternal Uncle   . Skin cancer Maternal Uncle   . Tongue cancer Maternal Uncle        uncle died of heart attack  . Alcoholism Maternal Uncle   . Lung cancer Paternal Aunt        pat aunts x 4 died of lung cancer  . Lung cancer Paternal Aunt   . Lung cancer Paternal Aunt   . Lung cancer Paternal Aunt   . Mesothelioma Cousin        paternal cousin  Social History Social History   Tobacco Use  . Smoking status: Never Smoker  . Smokeless tobacco: Never Used  Vaping Use  . Vaping Use: Never used  Substance Use Topics  . Alcohol use: No  . Drug use: No    Review of Systems  Constitutional: No fever/chills Eyes: No visual changes. ENT: No sore throat. Cardiovascular: Denies chest pain. Respiratory: Denies shortness of breath. Gastrointestinal: Positive for left flank and abdominal pain.  No nausea, no vomiting.  No diarrhea.  No constipation. Genitourinary: Negative for dysuria. Musculoskeletal: Negative for back pain. Skin: Negative for rash. Neurological: Negative for headaches, focal weakness or numbness.   ____________________________________________   PHYSICAL EXAM:  VITAL SIGNS: ED Triage Vitals  Enc Vitals Group     BP 01/28/20 1919 128/86     Pulse Rate 01/28/20 1919 (!) 102     Resp 01/28/20 1919 18     Temp 01/28/20 1919 98.2 F (36.8 C)     Temp Source 01/28/20 1919 Oral     SpO2 01/28/20 1919 98 %     Weight 01/28/20 1922 181 lb (82.1 kg)     Height 01/28/20 1922 _0  (1.702 m)     Head Circumference --      Peak Flow --      Pain Score --      Pain Loc --      Pain Edu? --      Excl. in [place]? --     Constitutional: Alert and oriented. Well appearing and in mild acute distress. Eyes: Conjunctivae are normal. PERRL. EOMI. Head: Atraumatic. Nose: No congestion/rhinnorhea. Mouth/Throat: Mucous membranes are moist.   Neck: No stridor.   Cardiovascular: Normal rate, regular rhythm. Grossly normal heart sounds.   Good peripheral circulation. Respiratory: Normal respiratory effort.  No retractions. Lungs CTAB. Gastrointestinal: Soft and nontender to light or deep palpation. No distention. No abdominal bruits. Mild left CVA tenderness.  Midline surgical scar clean/dry/intact without warmth or erythema. Musculoskeletal: No lower extremity tenderness nor edema.  No joint effusions. Neurologic:  Normal speech and language. No gross focal neurologic deficits are appreciated.  Skin:  Skin is warm, dry and intact. No rash noted. Psychiatric: Mood and affect are normal. Speech and behavior are normal.  ____________________________________________   LABS (all labs ordered are listed, but only abnormal results are displayed)  Labs Reviewed  LIPASE, BLOOD - Abnormal; Notable for the following components:      Result Value   Lipase 130 (*)    All other components within normal limits  COMPREHENSIVE METABOLIC PANEL - Abnormal; Notable for the following components:   Creatinine, Ser 1.33 (*)    GFR calc non Af Amer 55 (*)    All other components within normal limits  CBC - Abnormal; Notable for the following components:   WBC 13.8 (*)    RBC 3.81 (*)    Hemoglobin 12.2 (*)    HCT 36.8 (*)    Platelets 498 (*)    All other components within normal limits  URINALYSIS, COMPLETE (UACMP) WITH MICROSCOPIC - Abnormal; Notable for the following components:   Color, Urine YELLOW (*)    APPearance HAZY (*)    Protein, ur 30 (*)    All other components within normal limits  SARS CORONAVIRUS 2 BY RT PCR (HOSPITAL ORDER, [facility])   ____________________________________________  EKG  ED ECG REPORT I, Corryn Madewell J, the attending physician, personally viewed and interpreted this ECG.   Date:  01/28/2020  EKG Time: 0042  Rate: 93  Rhythm: normal EKG, normal sinus rhythm  Axis: Normal  Intervals:none  ST&T Change:  Nonspecific  ____________________________________________  RADIOLOGY  ED MD interpretation: No acute cardiopulmonary process; CT demonstrates no kidney stones, gallstones; ultrasound demonstrates cholelithiasis without cholecystitis; intra and extrahepatic biliary ductal dilatation; MRCP  + choledocholithiasis  Official radiology report(s): DG Chest Port 1 View  Result Date: 01/29/2020 CLINICAL DATA:  Left flank pain EXAM: PORTABLE CHEST 1 VIEW COMPARISON:  11/06/2019 FINDINGS: The heart size and mediastinal contours are within normal limits. Both lungs are clear. The visualized skeletal structures are unremarkable. Incompletely visualized lumbar spinal hardware. IMPRESSION: No active disease. Electronically Signed   By: Ulyses Jarred M.D.   On: 01/29/2020 00:40   CT Renal Stone Study  Result Date: 01/29/2020 CLINICAL DATA:  Left flank pain for 1 week, worse today. Lumbar spinal fusion 2 weeks ago. EXAM: CT ABDOMEN AND PELVIS WITHOUT CONTRAST TECHNIQUE: Multidetector CT imaging of the abdomen and pelvis was performed following the standard protocol without IV contrast. COMPARISON:  Abdominopelvic CT 10/23/2018 FINDINGS: Lower chest: The lung bases are clear. No consolidation or pleural fluid. Tiny subpleural nodules in the right lower lobe are stable and considered benign. Hepatobiliary: No evidence of focal abnormality allowing for lack of IV contrast. Gallbladder is distended. Layering gallstones are noted. No pericholecystic inflammation. There is no biliary dilatation Pancreas: Mild fatty atrophy.  No ductal dilatation or inflammation. Spleen: Normal in size without focal abnormality. Adrenals/Urinary Tract: No adrenal nodule. No hydronephrosis. No renal or ureteral calculi. Mild symmetric perinephric edema that appears similar to prior exam. Again seen bilateral renal cortical atrophy. There are bilateral renal cysts, grossly stable from prior exam, some of which not well-defined in the absence  of IV contrast. Ureters are decompressed without stones along the course. Urinary bladder is partially distended. No bladder stone or wall thickening. Stomach/Bowel: Gastric bypass anatomy with moderate hiatal hernia. The excluded gastric remnant is decompressed and unremarkable. The Roux limb is nondilated. Jejunal anastomosis is slightly patulous, as before. There is no evidence of obstruction or bowel inflammation. Appendix not definitively seen, no evidence of appendicitis. Colonic diverticulosis involving the descending and sigmoid colon. No evidence of diverticulitis. Vascular/Lymphatic: Aortic atherosclerosis. No aortic aneurysm. No enlarged lymph nodes in the abdomen or pelvis. Reproductive: Prominent prostate gland spanning 4.7 cm with central calcifications. Other: No ascites or free air. Tiny fat containing umbilical hernia. Postsurgical change of the upper anterior abdominal wall. Musculoskeletal: Thoracolumbar spinal fusion fixating L1 compression fracture. There are interbody spacers at L2-L3, L3-L4, and L4-L5. There are no adverse features. Expected postsurgical change in the soft tissues. No muscular abnormality to account for flank pain. IMPRESSION: 1. No renal stones or obstructive uropathy. No acute abnormality in the abdomen/pelvis. 2. Gastric bypass anatomy with moderate hiatal hernia. 3. Distended gallbladder with gallstones, but no pericholecystic inflammation 4. Colonic diverticulosis without diverticulitis. 5. Recent postsurgical change in the thoracolumbar spine without evident complication. Aortic Atherosclerosis (ICD10-I70.0). Electronically Signed   By: Keith Rake M.D.   On: 01/29/2020 00:20   US ABDOMEN LIMITED RUQ  Result Date: 01/29/2020 CLINICAL DATA:  Pancreatitis, gallstones seen on CT. EXAM: ULTRASOUND ABDOMEN LIMITED RIGHT UPPER QUADRANT COMPARISON:  CT 01/28/2020 FINDINGS: Gallbladder: Distended gallbladder is present containing echogenic, posteriorly shadowing  gallstones. More intermediate echogenicity structure measuring up to 1.7 cm along the dependent gallbladder wall likely reflecting some tumefactive sludge (30/47). No frank gallbladder wall thickening. Sonographic Percell Miller sign was reportedly negative. Common  bile duct: Diameter: Intra and extrahepatic biliary ductal dilatation with the proximal common bile duct measuring up to 11.9 mm, 10 mm distally. No visible calcified stones within the common bile duct though portions of the more distal duct are poorly visualized due to bowel gas. Liver: No focal lesion identified. Within normal limits in parenchymal echogenicity. Portal vein is patent on color Doppler imaging with normal direction of blood flow towards the liver. Other: None. IMPRESSION: Echogenic, shadowing gallstones as well as a larger possible sludge ball/tumefactive sludge. Gallbladder distention is nonspecific in the absence of wall thickening or a positive sonographic Murphy sign though acute cholecystitis can present with an isolated hydropic appearance of the gallbladder. Mild intra and more pronounced extrahepatic biliary ductal dilatation. If there is concern for choledocholithiasis, consider MRCP. Electronically Signed   By: Lovena Le M.D.   On: 01/29/2020 02:03    ____________________________________________   PROCEDURES  Procedure(s) performed (including Critical Care):  Procedures  .1-3 Lead EKG Interpretation Performed by: Paulette Blanch, MD Authorized by: Paulette Blanch, MD     Interpretation: normal     ECG rate:  85   ECG rate assessment: normal     Rhythm: sinus rhythm     Ectopy: none     Conduction: normal   Comments:     Patient placed on cardiac monitor to evaluate for arrhythmias   CRITICAL CARE Performed by: Paulette Blanch   Total critical care time: 45 minutes  Critical care time was exclusive of separately billable procedures and treating other patients.  Critical care was necessary to treat or prevent  imminent or life-threatening deterioration.  Critical care was time spent personally by me on the following activities: development of treatment plan with patient and/or surrogate as well as nursing, discussions with consultants, evaluation of patient's response to treatment, examination of patient, obtaining history from patient or surrogate, ordering and performing treatments and interventions, ordering and review of laboratory studies, ordering and review of radiographic studies, pulse oximetry and re-evaluation of patient's condition.  ____________________________________________   INITIAL IMPRESSION / ASSESSMENT AND PLAN / ED COURSE  As part of my medical decision making, I reviewed the following data within the [place] notes reviewed and incorporated, Labs reviewed, EKG interpreted, Old chart reviewed, Radiograph reviewed, Notes from prior ED visits and [place] Controlled Substance Database     Justin Patel was evaluated in Emergency Department on 01/29/2020 for the symptoms described in the history of present illness. He was evaluated in the context of the global COVID-19 pandemic, which necessitated consideration that the patient might be at risk for infection with the SARS-CoV-2 virus that causes COVID-19. Institutional protocols and algorithms that pertain to the evaluation of patients at risk for COVID-19 are in a state of rapid change based on information released by regulatory bodies including the CDC and federal and state organizations. These policies and algorithms were followed during the patient's care in the ED.    69 year old male presenting with left flank pain; recent lumbar fusion 2 weeks ago. Differential diagnosis includes, but is not limited to, acute appendicitis, renal colic, testicular torsion, urinary tract infection/pyelonephritis, prostatitis,  epididymitis, diverticulitis, small bowel obstruction or ileus, colitis, abdominal aortic aneurysm,  gastroenteritis, hernia, etc.  Laboratory results remarkable for AKI, moderate leukocytosis, elevated lipase.  Will initiate IV fluid hydration, IV Dilaudid for pain, IV Zofran for nausea and proceed with CT renal colic study.   Clinical Course as of Jan 28 550  Tue  Jan 29, 2020  0032 Patient given additional IV Dilaudid for pain returning.  Updated him on CT results.  Will obtain RUQ ultrasound.   [JS]  0221 Patient requiring additional rounds of IV Dilaudid as pain is poorly controlled.  Updated him on ultrasound result.  Will discuss with hospitalist services for admission.   [JS]  0256 No GI available for ERCP this week. Will obtain MRCP to evaluate choledocholithiasis. If positive, patient will require transfer to another facility. If negative, will admit to our facility.   [JS]  [phone] Spoke with Dr. Marguerita Merles from radiology regarding patient's MRCP; report in PACS. + for choledocholithiasis with stone in the cystic duct as well as stones in the CBD.  Will call [facility] for transfer.   [JS]  [id number] Patient accepted by Dr. Alcario Drought to [facility]; placed on the waiting list as [facility] is currently full.  Have ordered maintenance IV fluids and PRN Dilaudid for pain control.  Patient updated and agreeable with plan of care.   [JS]  1003 Bed available at [facility]; patient updated.  Arranging transport.   [JS]    Clinical Course User Index [JS] Paulette Blanch, MD     ____________________________________________   FINAL CLINICAL IMPRESSION(S) / ED DIAGNOSES  Final diagnoses:  Left flank pain  Acute gallstone pancreatitis  Intractable pain  Choledocholithiasis     ED Discharge Orders    None       Note:  This document was prepared using Dragon voice recognition software and may include unintentional dictation errors.   Paulette Blanch, MD 01/29/20 4961    Paulette Blanch, MD 01/29/20 [phone]

## 2020-01-29 NOTE — ED Notes (Signed)
EMTALA reviewed. 

## 2020-01-29 NOTE — ED Notes (Signed)
Pt reports pain is at an 11 now. 2nd dose of dilaudid given. Dr Beather Arbour at bedside. Pt offered the possibility of going home if pain is controlled. Pt states he will not be able to control his pain at home.

## 2020-01-30 DIAGNOSIS — Z9884 Bariatric surgery status: Secondary | ICD-10-CM | POA: Diagnosis not present

## 2020-01-30 DIAGNOSIS — K831 Obstruction of bile duct: Secondary | ICD-10-CM | POA: Diagnosis not present

## 2020-01-30 DIAGNOSIS — Z980 Intestinal bypass and anastomosis status: Secondary | ICD-10-CM | POA: Diagnosis not present

## 2020-01-30 DIAGNOSIS — K660 Peritoneal adhesions (postprocedural) (postinfection): Secondary | ICD-10-CM | POA: Diagnosis not present

## 2020-01-30 DIAGNOSIS — E669 Obesity, unspecified: Secondary | ICD-10-CM | POA: Diagnosis not present

## 2020-01-30 DIAGNOSIS — K219 Gastro-esophageal reflux disease without esophagitis: Secondary | ICD-10-CM | POA: Diagnosis not present

## 2020-01-30 DIAGNOSIS — Z20822 Contact with and (suspected) exposure to covid-19: Secondary | ICD-10-CM | POA: Diagnosis not present

## 2020-01-30 DIAGNOSIS — Z9889 Other specified postprocedural states: Secondary | ICD-10-CM | POA: Diagnosis not present

## 2020-01-30 DIAGNOSIS — K811 Chronic cholecystitis: Secondary | ICD-10-CM | POA: Diagnosis not present

## 2020-01-30 LAB — COMPREHENSIVE METABOLIC PANEL
ALT: 12 U/L (ref 0–44)
AST: 15 U/L (ref 15–41)
Albumin: 3 g/dL — ABNORMAL LOW (ref 3.5–5.0)
Alkaline Phosphatase: 59 U/L (ref 38–126)
Anion gap: 4 — ABNORMAL LOW (ref 5–15)
BUN: 9 mg/dL (ref 8–23)
CO2: 24 mmol/L (ref 22–32)
Calcium: 7.9 mg/dL — ABNORMAL LOW (ref 8.9–10.3)
Chloride: 109 mmol/L (ref 98–111)
Creatinine, Ser: 0.85 mg/dL (ref 0.61–1.24)
GFR calc Af Amer: >60 mL/min (ref >60)
GFR calc non Af Amer: >60 mL/min (ref >60)
Glucose, Bld: 95 mg/dL (ref 70–99)
Potassium: 3.5 mmol/L (ref 3.5–5.1)
Sodium: 137 mmol/L (ref 135–145)
Total Bilirubin: 0.4 mg/dL (ref 0.3–1.2)
Total Protein: 5.7 g/dL — ABNORMAL LOW (ref 6.5–8.1)

## 2020-01-30 LAB — CBC
HCT: 31.2 % — ABNORMAL LOW (ref 39.0–52.0)
Hemoglobin: 10 g/dL — ABNORMAL LOW (ref 13.0–17.0)
MCH: 32.7 pg (ref 26.0–34.0)
MCHC: 32.1 g/dL (ref 30.0–36.0)
MCV: 102 fL — ABNORMAL HIGH (ref 80.0–100.0)
Platelets: 337 10*3/uL (ref 150–400)
RBC: 3.06 MIL/uL — ABNORMAL LOW (ref 4.22–5.81)
RDW: 14.7 % (ref 11.5–15.5)
WBC: 10 10*3/uL (ref 4.0–10.5)
nRBC: 0 % (ref 0.0–0.2)

## 2020-01-30 LAB — LACTIC ACID, PLASMA: Lactic Acid, Venous: 1.1 mmol/L (ref 0.5–1.9)

## 2020-01-30 LAB — GLUCOSE, CAPILLARY
Glucose-Capillary: 74 mg/dL (ref 70–99)
Glucose-Capillary: 79 mg/dL (ref 70–99)

## 2020-01-30 MED ORDER — AMLODIPINE BESYLATE 10 MG PO TABS
10.0000 mg | ORAL_TABLET | Freq: Every day | ORAL | Status: DC
  Administered 2020-01-30: 10 mg via ORAL
  Filled 2020-01-30: qty 1

## 2020-01-30 MED ORDER — DULOXETINE HCL 60 MG PO CPEP
60.0000 mg | ORAL_CAPSULE | Freq: Every day | ORAL | Status: DC
  Administered 2020-01-30: 60 mg via ORAL
  Filled 2020-01-30: qty 1

## 2020-01-30 MED ORDER — METOPROLOL SUCCINATE ER 50 MG PO TB24
50.0000 mg | ORAL_TABLET | Freq: Every day | ORAL | Status: DC
  Administered 2020-01-30: 50 mg via ORAL
  Filled 2020-01-30: qty 1

## 2020-01-30 MED ORDER — ROSUVASTATIN CALCIUM 20 MG PO TABS
40.0000 mg | ORAL_TABLET | Freq: Every day | ORAL | Status: DC
  Administered 2020-01-30: 40 mg via ORAL
  Filled 2020-01-30: qty 2

## 2020-01-30 NOTE — Progress Notes (Signed)
Pt left with transport.

## 2020-01-30 NOTE — Discharge Summary (Signed)
Physician Discharge Summary  Justin Patel PIR:[id number] DOB: 02-16-1951   PCP: Justin Haven, MD  Admit date: 01/29/2020 Discharge date: 01/30/2020 Length of Stay: 1 days   Code Status: Full Code  Admitted From:  Home Discharged to:  [facility] Discharge Condition:  Stable   Hospital Summary  This is a 69 year old male with history of gastric bypass surgery at [name], hypertension, hyperlipidemia, degenerative disc disease s/p lumbar fusion 2 weeks ago at [name], type 2 diabetes on Tradjenta, GERD, anxiety, nephrolithiasis who presented to [name] on 6/21 due to left-sided flank and abdominal pain.  Has had waxing and waning pain for the past several months but has become progressively worse and more constant as of recently.  Initially thought to have a kidney stone but did not have any dysuria that he typically does have and noted the pain was worse after eating.  Denied nausea or vomiting, fever or chills on admission.  Had 4 episodes of nonbloody diarrhea prior to presentation insetting of stool softeners for opioid induced constipation.   In the ED at [name], evaluation labs are notable for creatinine 1.33, lipase 130, WBCs 13.8, hemoglobin 12.2, platelets 498, COVID-19 negative.  Urinalysis was negative for infection.  CT abdomen pelvis renal stone protocol showed was negative for stones or obstructive uropathy, gastric bypass anatomy with moderate hiatal hernia, distended gallbladder with gallstones but no surrounding inflammation (other nonpertinent findings per report).  Right upper quadrant ultrasound showed gallstones, possible sludge ball / tumefactive sludge, and mild intra and more pronounced extrahepatic biliary ductal dilatation.    MRCP was therefore obtained and showed:  1. Cholelithiasis. No radiographic evidence of acute cholecystitis. 2. Diffuse biliary ductal dilatation. Abrupt termination of the distal common bile duct near the ampulla, with "meniscus  sign "which is suspicious for an impacted distal common bile duct stone. Consider ERCP for further evaluation. 3. No radiographic evidence of acute pancreatitis, pancreatic ductal dilatation or pancreas divisum.  Given the need for ERCP unable to be performed at [name], patient was transferred to [facility] for admission.  GI is consulted.  Per GI: given his gastric bypass history, he was prohibited from having a normal ERCP technique for this.  GI discussed with advanced endoscopy and they felt that his case should be referred to a tertiary care facility.  Arrangements were made for patient to be transferred to [name] health and patient was transferred in stable condition on 6/23.  A & P   Principal Problem:   Choledocholithiasis Active Problems:   Diabetes ([place])   Essential hypertension, benign   Hyperlipidemia   BPH (benign prostatic hyperplasia)   Chronic pain syndrome   Anxiety   GERD (gastroesophageal reflux disease)   CKD (chronic kidney disease) stage 3, GFR 30-59 ml/min   History of gastric bypass   1. Choledocholithiasis with associated mild pancreatitis in setting of history of gastric bypass 1. Hemodynamically stable on room air 2. MRCP findings as above 3. Initially transferred from [name] to [facility] but due to gastric bypass/abnormal abdominal anatomy ERCP was unable to be performed and patient was transferred to [place] at the recommendation of GI 4. Pain management and bowel regimen per tertiary center  2. Type 2 diabetes 1. Well-controlled on Tradjenta 2. Held this admission 3. Per tertiary center  3. Hypertension 1. Continue home amlodipine and metoprolol  4. CKD 3 a, stable  5. Hyperlipidemia on statin  6. BPH on Flomax  7. Degenerative disc disease  s/p lumbar spinal fusion 2 weeks ago at [name] 1. Pain regimen and bowel regimen per tertiary center  8. GERD  9. Anxiety, stable  10. History of gastric bypass at  [name]   GI    Antibiotics   Anti-infectives (From admission, onward)   None       Subjective  Patient seen and examined at bedside no acute distress and resting comfortably.  No events overnight.   In good spirits and anticipating discharge.   Denies any chest pain, shortness of breath,  nausea, vomiting, urinary or bowel complaints.  Does have occasional reported rigors but no fevers.  Otherwise ROS negative    Objective   Discharge Exam: Vitals:   01/30/20 0924 01/30/20 1323  BP: (!) 152/85 (!) 158/80  Pulse: 70 89  Resp: 18 (!) 21  Temp: 98.6 F (37 C) 98.1 F (36.7 C)  SpO2: 98% 98%   Vitals:   01/30/20 0141 01/30/20 0543 01/30/20 0924 01/30/20 1323  BP: 122/71 137/80 (!) 152/85 (!) 158/80  Pulse: 64 69 70 89  Resp: 18 18 18  (!) 21  Temp: 98.4 F (36.9 C) 97.9 F (36.6 C) 98.6 F (37 C) 98.1 F (36.7 C)  TempSrc: Oral Oral Oral Oral  SpO2: 100% 100% 98% 98%  Weight:      Height:        Physical Exam Vitals and nursing note reviewed.  Constitutional:      Appearance: Normal appearance.  HENT:     Head: Normocephalic and atraumatic.  Eyes:     Conjunctiva/sclera: Conjunctivae normal.  Cardiovascular:     Rate and Rhythm: Normal rate and regular rhythm.  Pulmonary:     Effort: Pulmonary effort is normal.     Breath sounds: Normal breath sounds.  Abdominal:     General: Abdomen is flat.     Palpations: Abdomen is soft.  Musculoskeletal:        General: No swelling or tenderness.     Comments: Well-healing postsurgical spinal incision  Skin:    Coloration: Skin is not jaundiced or pale.  Neurological:     Mental Status: He is alert. Mental status is at baseline.  Psychiatric:        Mood and Affect: Mood normal.        Behavior: Behavior normal.       The results of significant diagnostics from this hospitalization (including imaging, microbiology, ancillary and laboratory) are listed below for reference.      Microbiology: Recent Results (from the past 240 hour(s))  SARS Coronavirus 2 by RT PCR (hospital order, performed in [place] hospital lab) Nasopharyngeal Nasopharyngeal Swab     Status: None   Collection Time: 01/29/20  3:13 AM   Specimen: Nasopharyngeal Swab  Result Value Ref Range Status   SARS Coronavirus 2 NEGATIVE NEGATIVE Final    Comment: (NOTE) SARS-CoV-2 target nucleic acids are NOT DETECTED.  The SARS-CoV-2 RNA is generally detectable in upper and lower respiratory specimens during the acute phase of infection. The lowest concentration of SARS-CoV-2 viral copies this assay can detect is 250 copies / mL. A negative result does not preclude SARS-CoV-2 infection and should not be used as the sole basis for treatment or other patient management decisions.  A negative result may occur with improper specimen collection / handling, submission of specimen other than nasopharyngeal swab, presence of viral mutation(s) within the areas targeted by this assay, and inadequate number of viral copies (<250 copies /  mL). A negative result must be combined with clinical observations, patient history, and epidemiological information.  Fact Sheet for Patients:   StrictlyIdeas.no  Fact Sheet for Healthcare Providers: BankingDealers.co.za  This test is not yet approved or  cleared by the [place] FDA and has been authorized for detection and/or diagnosis of SARS-CoV-2 by FDA under an Emergency Use Authorization (EUA).  This EUA will remain in effect (meaning this test can be used) for the duration of the COVID-19 declaration under Section 564(b)(1) of the Act, 21 U.S.C. section 360bbb-3(b)(1), unless the authorization is terminated or revoked sooner.  Performed at [facility], [address] [place], [place] [address]      Labs: BNP (last 3 results) Recent Labs    11/06/19 1408  BNP 71.2   Basic Metabolic  Panel: Recent Labs  Lab 01/28/20 1922 01/30/20 0129  NA 137 137  K 4.0 3.5  CL 103 109  CO2 22 24  GLUCOSE 85 95  BUN 13 9  CREATININE 1.33* 0.85  CALCIUM 9.5 7.9*   Liver Function Tests: Recent Labs  Lab 01/28/20 1922 01/30/20 0129  AST 23 15  ALT 14 12  ALKPHOS 71 59  BILITOT 0.6 0.4  PROT 7.4 5.7*  ALBUMIN 3.6 3.0*   Recent Labs  Lab 01/28/20 1922  LIPASE 130*   No results for input(s): AMMONIA in the last 168 hours. CBC: Recent Labs  Lab 01/28/20 1922 01/29/20 2245 01/30/20 0129  WBC 13.8* 10.8* 10.0  HGB 12.2* 10.4* 10.0*  HCT 36.8* 32.5* 31.2*  MCV 96.6 101.6* 102.0*  PLT 498* 367 337   Cardiac Enzymes: No results for input(s): CKTOTAL, CKMB, CKMBINDEX, TROPONINI in the last 168 hours. BNP: Invalid input(s): POCBNP CBG: Recent Labs  Lab 01/29/20 1330 01/29/20 1524 01/29/20 2155 01/30/20 0730 01/30/20 1201  GLUCAP 80 83 79 74 79   D-Dimer No results for input(s): DDIMER in the last 72 hours. Hgb A1c No results for input(s): HGBA1C in the last 72 hours. Lipid Profile No results for input(s): CHOL, HDL, LDLCALC, TRIG, CHOLHDL, LDLDIRECT in the last 72 hours. Thyroid function studies No results for input(s): TSH, T4TOTAL, T3FREE, THYROIDAB in the last 72 hours.  Invalid input(s): FREET3 Anemia work up No results for input(s): VITAMINB12, FOLATE, FERRITIN, TIBC, IRON, RETICCTPCT in the last 72 hours. Urinalysis    Component Value Date/Time   COLORURINE YELLOW (A) 01/28/2020 1922   APPEARANCEUR HAZY (A) 01/28/2020 1922   APPEARANCEUR Clear 05/01/2014 0928   LABSPEC 1.026 01/28/2020 1922   LABSPEC 1.031 05/01/2014 0928   PHURINE 5.0 01/28/2020 1922   GLUCOSEU NEGATIVE 01/28/2020 1922   GLUCOSEU Negative 05/01/2014 0928   HGBUR NEGATIVE 01/28/2020 1922   BILIRUBINUR NEGATIVE 01/28/2020 1922   BILIRUBINUR Negative 05/01/2014 0928   KETONESUR NEGATIVE 01/28/2020 1922   PROTEINUR 30 (A) 01/28/2020 1922   UROBILINOGEN 0.2 08/27/2013 0948    NITRITE NEGATIVE 01/28/2020 1922   LEUKOCYTESUR NEGATIVE 01/28/2020 1922   LEUKOCYTESUR Negative 05/01/2014 0928   Sepsis Labs Invalid input(s): PROCALCITONIN,  WBC,  LACTICIDVEN Microbiology Recent Results (from the past 240 hour(s))  SARS Coronavirus 2 by RT PCR (hospital order, performed in [place] hospital lab) Nasopharyngeal Nasopharyngeal Swab     Status: None   Collection Time: 01/29/20  3:13 AM   Specimen: Nasopharyngeal Swab  Result Value Ref Range Status   SARS Coronavirus 2 NEGATIVE NEGATIVE Final    Comment: (NOTE) SARS-CoV-2 target nucleic acids are NOT DETECTED.  The SARS-CoV-2 RNA is generally  detectable in upper and lower respiratory specimens during the acute phase of infection. The lowest concentration of SARS-CoV-2 viral copies this assay can detect is 250 copies / mL. A negative result does not preclude SARS-CoV-2 infection and should not be used as the sole basis for treatment or other patient management decisions.  A negative result may occur with improper specimen collection / handling, submission of specimen other than nasopharyngeal swab, presence of viral mutation(s) within the areas targeted by this assay, and inadequate number of viral copies (<250 copies / mL). A negative result must be combined with clinical observations, patient history, and epidemiological information.  Fact Sheet for Patients:   StrictlyIdeas.no  Fact Sheet for Healthcare Providers: BankingDealers.co.za  This test is not yet approved or  cleared by the [place] FDA and has been authorized for detection and/or diagnosis of SARS-CoV-2 by FDA under an Emergency Use Authorization (EUA).  This EUA will remain in effect (meaning this test can be used) for the duration of the COVID-19 declaration under Section 564(b)(1) of the Act, 21 U.S.C. section 360bbb-3(b)(1), unless the authorization is terminated or revoked  sooner.  Performed at [facility], [address] [place], [place] [address]     Discharge Instructions     Discharge Instructions    Diet - low sodium heart healthy   Complete by: As directed    Increase activity slowly   Complete by: As directed      Allergies as of 01/30/2020      Reactions   Altace [ramipril] Anaphylaxis   Levaquin [levofloxacin In D5w] Hives   Levofloxacin    Conray [iothalamate] Hives   HIVES, he had this reaction to Ionic contrast in 1974 for an IVP. SPM   Lyrica [pregabalin] Other (See Comments)   Edema   Metformin Diarrhea   High doses cause diarrhea.   Trulicity [dulaglutide] Nausea And Vomiting      Medication List    STOP taking these medications   doxycycline 100 MG tablet Commonly known as: VIBRA-TABS     TAKE these medications   amLODipine 10 MG tablet Commonly known as: NORVASC TAKE ONE TABLET EVERY DAY   blood glucose meter kit and supplies Kit Dispense based on patient and insurance preference. Check once daily. ICD doing E11.9.   CALCIUM 600/VITAMIN D PO Take 1 tablet by mouth daily.   DULoxetine 60 MG capsule Commonly known as: CYMBALTA TAKE 1 CAPSULE BY MOUTH EVERY DAY What changed: how much to take   esomeprazole 40 MG capsule Commonly known as: NEXIUM TAKE 1 CAPSULE BY MOUTH ONCE DAILY   furosemide 20 MG tablet Commonly known as: LASIX TAKE ONE TABLET BY MOUTH EVERY DAY   HYDROcodone-acetaminophen 5-325 MG tablet Commonly known as: NORCO/VICODIN Take 1 tablet by mouth every 6 (six) hours as needed for moderate pain.   methocarbamol 500 MG tablet Commonly known as: ROBAXIN Take 1 tablet (500 mg total) by mouth every 6 (six) hours as needed for muscle spasms.   metoprolol succinate 50 MG 24 hr tablet Commonly known as: TOPROL-XL TAKE 1 TABLET BY MOUTH DAILY WITH OR IMMEDIATLY FOLLOWING A MEAL What changed: See the new instructions.   multivitamin capsule Take 1 capsule by mouth daily. With  copper and zinc   nystatin cream Commonly known as: MYCOSTATIN Apply 1 application topically 2 (two) times daily.   nystatin-triamcinolone ointment Commonly known as: MYCOLOG Apply 1 application topically 2 (two) times daily.   primidone 50 MG tablet Commonly known as:  MYSOLINE Take 1 tablet (50 mg total) by mouth at bedtime. TAKE 1/2 TABLET AT BEDTIME FOR 7 DAYS THEN INCREASE TO TAKE ONE TABLET AT BEDTIME   rosuvastatin 40 MG tablet Commonly known as: CRESTOR TAKE ONE TABLET EVERY DAY   sucralfate 1 g tablet Commonly known as: CARAFATE TAKE ONE TABLET BY MOUTH FOUR TIMES DAILY   tamsulosin 0.4 MG Caps capsule Commonly known as: FLOMAX TAKE 1 CAPSULE BY MOUTH EVERY DAY   Tradjenta 5 MG Tabs tablet Generic drug: linagliptin TAKE ONE TABLET BY MOUTH EVERY DAY What changed: how much to take   Vitamin D (Ergocalciferol) 1.25 MG (50000 UNIT) Caps capsule Commonly known as: DRISDOL TAKE 1 CAPSULE EVERY 7 DAYS   Vitamin D 50 MCG (2000 UT) Caps Take 4,000 Units by mouth daily.       Allergies  Allergen Reactions   Altace [Ramipril] Anaphylaxis   Levaquin [Levofloxacin In D5w] Hives   Levofloxacin    Conray [Iothalamate] Hives    HIVES, he had this reaction to Ionic contrast in 1974 for an IVP. SPM    Lyrica [Pregabalin] Other (See Comments)    Edema   Metformin Diarrhea    High doses cause diarrhea.    Trulicity [Dulaglutide] Nausea And Vomiting     Dispo: The patient is from: Home              Anticipated d/c is to: [place]            Time coordinating discharge: Over 30 minutes   SIGNED:   Harold Hedge, D.O. [facility] Pager: [phone]  01/30/2020, 3:52 PM

## 2020-01-30 NOTE — Progress Notes (Signed)
LFTs and WBC normal. Hospitalist coordinating transfer to tertiary care center for ERCP and cholecystectomy. We are unable to proceed with ERCP given Roux-en-Y gastric bypass anatomy. GI will sign off.

## 2020-01-30 NOTE — Progress Notes (Signed)
Pt stable and awaiting pickup and transfer by [place] [phone]. Report called to nurse he is going to room [address]. PT is aware.

## 2020-01-31 DIAGNOSIS — Z20822 Contact with and (suspected) exposure to covid-19: Secondary | ICD-10-CM | POA: Diagnosis not present

## 2020-01-31 DIAGNOSIS — Z9884 Bariatric surgery status: Secondary | ICD-10-CM | POA: Diagnosis not present

## 2020-01-31 DIAGNOSIS — K8050 Calculus of bile duct without cholangitis or cholecystitis without obstruction: Secondary | ICD-10-CM | POA: Diagnosis not present

## 2020-01-31 DIAGNOSIS — I10 Essential (primary) hypertension: Secondary | ICD-10-CM | POA: Diagnosis not present

## 2020-01-31 DIAGNOSIS — K8042 Calculus of bile duct with acute cholecystitis without obstruction: Secondary | ICD-10-CM | POA: Diagnosis not present

## 2020-01-31 DIAGNOSIS — Z980 Intestinal bypass and anastomosis status: Secondary | ICD-10-CM | POA: Diagnosis not present

## 2020-01-31 DIAGNOSIS — K8020 Calculus of gallbladder without cholecystitis without obstruction: Secondary | ICD-10-CM | POA: Diagnosis not present

## 2020-01-31 DIAGNOSIS — Z8719 Personal history of other diseases of the digestive system: Secondary | ICD-10-CM | POA: Diagnosis not present

## 2020-01-31 DIAGNOSIS — K811 Chronic cholecystitis: Secondary | ICD-10-CM | POA: Diagnosis not present

## 2020-01-31 DIAGNOSIS — E119 Type 2 diabetes mellitus without complications: Secondary | ICD-10-CM | POA: Diagnosis not present

## 2020-01-31 DIAGNOSIS — K219 Gastro-esophageal reflux disease without esophagitis: Secondary | ICD-10-CM | POA: Diagnosis not present

## 2020-01-31 DIAGNOSIS — R1084 Generalized abdominal pain: Secondary | ICD-10-CM | POA: Diagnosis not present

## 2020-01-31 DIAGNOSIS — K660 Peritoneal adhesions (postprocedural) (postinfection): Secondary | ICD-10-CM | POA: Diagnosis not present

## 2020-01-31 DIAGNOSIS — K831 Obstruction of bile duct: Secondary | ICD-10-CM | POA: Diagnosis not present

## 2020-01-31 DIAGNOSIS — Z9889 Other specified postprocedural states: Secondary | ICD-10-CM | POA: Diagnosis not present

## 2020-01-31 DIAGNOSIS — E669 Obesity, unspecified: Secondary | ICD-10-CM | POA: Diagnosis not present

## 2020-02-01 DIAGNOSIS — K219 Gastro-esophageal reflux disease without esophagitis: Secondary | ICD-10-CM | POA: Diagnosis not present

## 2020-02-01 DIAGNOSIS — Z8711 Personal history of peptic ulcer disease: Secondary | ICD-10-CM | POA: Diagnosis not present

## 2020-02-01 DIAGNOSIS — E119 Type 2 diabetes mellitus without complications: Secondary | ICD-10-CM | POA: Diagnosis not present

## 2020-02-01 DIAGNOSIS — K811 Chronic cholecystitis: Secondary | ICD-10-CM | POA: Diagnosis not present

## 2020-02-01 DIAGNOSIS — Z9889 Other specified postprocedural states: Secondary | ICD-10-CM | POA: Diagnosis not present

## 2020-02-01 DIAGNOSIS — T182XXA Foreign body in stomach, initial encounter: Secondary | ICD-10-CM | POA: Diagnosis not present

## 2020-02-01 DIAGNOSIS — I10 Essential (primary) hypertension: Secondary | ICD-10-CM | POA: Diagnosis not present

## 2020-02-01 DIAGNOSIS — Z20822 Contact with and (suspected) exposure to covid-19: Secondary | ICD-10-CM | POA: Diagnosis not present

## 2020-02-01 DIAGNOSIS — Z13810 Encounter for screening for upper gastrointestinal disorder: Secondary | ICD-10-CM | POA: Diagnosis not present

## 2020-02-01 DIAGNOSIS — K289 Gastrojejunal ulcer, unspecified as acute or chronic, without hemorrhage or perforation: Secondary | ICD-10-CM | POA: Diagnosis not present

## 2020-02-01 DIAGNOSIS — K660 Peritoneal adhesions (postprocedural) (postinfection): Secondary | ICD-10-CM | POA: Diagnosis not present

## 2020-02-01 DIAGNOSIS — Z7984 Long term (current) use of oral hypoglycemic drugs: Secondary | ICD-10-CM | POA: Diagnosis not present

## 2020-02-01 DIAGNOSIS — K8020 Calculus of gallbladder without cholecystitis without obstruction: Secondary | ICD-10-CM | POA: Diagnosis not present

## 2020-02-01 DIAGNOSIS — Z980 Intestinal bypass and anastomosis status: Secondary | ICD-10-CM | POA: Diagnosis not present

## 2020-02-01 DIAGNOSIS — K9189 Other postprocedural complications and disorders of digestive system: Secondary | ICD-10-CM | POA: Diagnosis not present

## 2020-02-01 DIAGNOSIS — Z79899 Other long term (current) drug therapy: Secondary | ICD-10-CM | POA: Diagnosis not present

## 2020-02-01 DIAGNOSIS — E669 Obesity, unspecified: Secondary | ICD-10-CM | POA: Diagnosis not present

## 2020-02-01 DIAGNOSIS — Z9884 Bariatric surgery status: Secondary | ICD-10-CM | POA: Diagnosis not present

## 2020-02-01 DIAGNOSIS — K831 Obstruction of bile duct: Secondary | ICD-10-CM | POA: Diagnosis not present

## 2020-02-02 DIAGNOSIS — K219 Gastro-esophageal reflux disease without esophagitis: Secondary | ICD-10-CM | POA: Diagnosis not present

## 2020-02-02 DIAGNOSIS — K8050 Calculus of bile duct without cholangitis or cholecystitis without obstruction: Secondary | ICD-10-CM | POA: Diagnosis not present

## 2020-02-02 DIAGNOSIS — K831 Obstruction of bile duct: Secondary | ICD-10-CM | POA: Diagnosis not present

## 2020-02-02 DIAGNOSIS — K259 Gastric ulcer, unspecified as acute or chronic, without hemorrhage or perforation: Secondary | ICD-10-CM | POA: Diagnosis not present

## 2020-02-02 DIAGNOSIS — E669 Obesity, unspecified: Secondary | ICD-10-CM | POA: Diagnosis not present

## 2020-02-02 DIAGNOSIS — K8042 Calculus of bile duct with acute cholecystitis without obstruction: Secondary | ICD-10-CM | POA: Diagnosis not present

## 2020-02-02 DIAGNOSIS — K660 Peritoneal adhesions (postprocedural) (postinfection): Secondary | ICD-10-CM | POA: Diagnosis not present

## 2020-02-02 DIAGNOSIS — Z9884 Bariatric surgery status: Secondary | ICD-10-CM | POA: Diagnosis not present

## 2020-02-02 DIAGNOSIS — Z20822 Contact with and (suspected) exposure to covid-19: Secondary | ICD-10-CM | POA: Diagnosis not present

## 2020-02-02 DIAGNOSIS — Z980 Intestinal bypass and anastomosis status: Secondary | ICD-10-CM | POA: Diagnosis not present

## 2020-02-02 DIAGNOSIS — K811 Chronic cholecystitis: Secondary | ICD-10-CM | POA: Diagnosis not present

## 2020-02-02 DIAGNOSIS — Z9889 Other specified postprocedural states: Secondary | ICD-10-CM | POA: Diagnosis not present

## 2020-02-02 DIAGNOSIS — E1169 Type 2 diabetes mellitus with other specified complication: Secondary | ICD-10-CM | POA: Diagnosis not present

## 2020-02-03 DIAGNOSIS — K660 Peritoneal adhesions (postprocedural) (postinfection): Secondary | ICD-10-CM | POA: Diagnosis not present

## 2020-02-03 DIAGNOSIS — Z9884 Bariatric surgery status: Secondary | ICD-10-CM | POA: Diagnosis not present

## 2020-02-03 DIAGNOSIS — E669 Obesity, unspecified: Secondary | ICD-10-CM | POA: Diagnosis not present

## 2020-02-03 DIAGNOSIS — Z20822 Contact with and (suspected) exposure to covid-19: Secondary | ICD-10-CM | POA: Diagnosis not present

## 2020-02-03 DIAGNOSIS — Z980 Intestinal bypass and anastomosis status: Secondary | ICD-10-CM | POA: Diagnosis not present

## 2020-02-03 DIAGNOSIS — K289 Gastrojejunal ulcer, unspecified as acute or chronic, without hemorrhage or perforation: Secondary | ICD-10-CM | POA: Diagnosis not present

## 2020-02-03 DIAGNOSIS — K811 Chronic cholecystitis: Secondary | ICD-10-CM | POA: Diagnosis not present

## 2020-02-03 DIAGNOSIS — K831 Obstruction of bile duct: Secondary | ICD-10-CM | POA: Diagnosis not present

## 2020-02-03 DIAGNOSIS — E1169 Type 2 diabetes mellitus with other specified complication: Secondary | ICD-10-CM | POA: Diagnosis not present

## 2020-02-03 DIAGNOSIS — Z9889 Other specified postprocedural states: Secondary | ICD-10-CM | POA: Diagnosis not present

## 2020-02-03 DIAGNOSIS — K8042 Calculus of bile duct with acute cholecystitis without obstruction: Secondary | ICD-10-CM | POA: Diagnosis not present

## 2020-02-03 DIAGNOSIS — K219 Gastro-esophageal reflux disease without esophagitis: Secondary | ICD-10-CM | POA: Diagnosis not present

## 2020-02-04 DIAGNOSIS — K8042 Calculus of bile duct with acute cholecystitis without obstruction: Secondary | ICD-10-CM | POA: Diagnosis not present

## 2020-02-04 DIAGNOSIS — K831 Obstruction of bile duct: Secondary | ICD-10-CM | POA: Diagnosis not present

## 2020-02-04 DIAGNOSIS — E669 Obesity, unspecified: Secondary | ICD-10-CM | POA: Diagnosis not present

## 2020-02-04 DIAGNOSIS — K660 Peritoneal adhesions (postprocedural) (postinfection): Secondary | ICD-10-CM | POA: Diagnosis not present

## 2020-02-04 DIAGNOSIS — K811 Chronic cholecystitis: Secondary | ICD-10-CM | POA: Diagnosis not present

## 2020-02-04 DIAGNOSIS — Z9889 Other specified postprocedural states: Secondary | ICD-10-CM | POA: Diagnosis not present

## 2020-02-04 DIAGNOSIS — E1169 Type 2 diabetes mellitus with other specified complication: Secondary | ICD-10-CM | POA: Diagnosis not present

## 2020-02-04 DIAGNOSIS — Z980 Intestinal bypass and anastomosis status: Secondary | ICD-10-CM | POA: Diagnosis not present

## 2020-02-04 DIAGNOSIS — K289 Gastrojejunal ulcer, unspecified as acute or chronic, without hemorrhage or perforation: Secondary | ICD-10-CM | POA: Diagnosis not present

## 2020-02-04 DIAGNOSIS — Z9884 Bariatric surgery status: Secondary | ICD-10-CM | POA: Diagnosis not present

## 2020-02-04 DIAGNOSIS — Z20822 Contact with and (suspected) exposure to covid-19: Secondary | ICD-10-CM | POA: Diagnosis not present

## 2020-02-04 DIAGNOSIS — K219 Gastro-esophageal reflux disease without esophagitis: Secondary | ICD-10-CM | POA: Diagnosis not present

## 2020-02-04 LAB — CULTURE, BLOOD (ROUTINE X 2)
Culture: NO GROWTH
Culture: NO GROWTH
Special Requests: ADEQUATE
Special Requests: ADEQUATE

## 2020-02-05 DIAGNOSIS — Z9889 Other specified postprocedural states: Secondary | ICD-10-CM | POA: Diagnosis not present

## 2020-02-05 DIAGNOSIS — Z6827 Body mass index (BMI) 27.0-27.9, adult: Secondary | ICD-10-CM | POA: Diagnosis not present

## 2020-02-05 DIAGNOSIS — K819 Cholecystitis, unspecified: Secondary | ICD-10-CM | POA: Diagnosis not present

## 2020-02-05 DIAGNOSIS — K660 Peritoneal adhesions (postprocedural) (postinfection): Secondary | ICD-10-CM | POA: Diagnosis not present

## 2020-02-05 DIAGNOSIS — Z9884 Bariatric surgery status: Secondary | ICD-10-CM | POA: Diagnosis not present

## 2020-02-05 DIAGNOSIS — K219 Gastro-esophageal reflux disease without esophagitis: Secondary | ICD-10-CM | POA: Diagnosis not present

## 2020-02-05 DIAGNOSIS — K8050 Calculus of bile duct without cholangitis or cholecystitis without obstruction: Secondary | ICD-10-CM | POA: Diagnosis not present

## 2020-02-05 DIAGNOSIS — K838 Other specified diseases of biliary tract: Secondary | ICD-10-CM | POA: Diagnosis not present

## 2020-02-05 DIAGNOSIS — K8020 Calculus of gallbladder without cholecystitis without obstruction: Secondary | ICD-10-CM | POA: Diagnosis not present

## 2020-02-05 DIAGNOSIS — E669 Obesity, unspecified: Secondary | ICD-10-CM | POA: Diagnosis not present

## 2020-02-05 DIAGNOSIS — K8042 Calculus of bile duct with acute cholecystitis without obstruction: Secondary | ICD-10-CM | POA: Diagnosis not present

## 2020-02-05 DIAGNOSIS — K289 Gastrojejunal ulcer, unspecified as acute or chronic, without hemorrhage or perforation: Secondary | ICD-10-CM | POA: Diagnosis not present

## 2020-02-05 DIAGNOSIS — Z20822 Contact with and (suspected) exposure to covid-19: Secondary | ICD-10-CM | POA: Diagnosis not present

## 2020-02-05 DIAGNOSIS — K831 Obstruction of bile duct: Secondary | ICD-10-CM | POA: Diagnosis not present

## 2020-02-05 DIAGNOSIS — K811 Chronic cholecystitis: Secondary | ICD-10-CM | POA: Diagnosis not present

## 2020-02-05 DIAGNOSIS — Z980 Intestinal bypass and anastomosis status: Secondary | ICD-10-CM | POA: Diagnosis not present

## 2020-02-06 DIAGNOSIS — Z9049 Acquired absence of other specified parts of digestive tract: Secondary | ICD-10-CM | POA: Diagnosis not present

## 2020-02-06 DIAGNOSIS — K8020 Calculus of gallbladder without cholecystitis without obstruction: Secondary | ICD-10-CM | POA: Diagnosis not present

## 2020-02-06 DIAGNOSIS — J9811 Atelectasis: Secondary | ICD-10-CM | POA: Diagnosis not present

## 2020-02-06 DIAGNOSIS — Z9884 Bariatric surgery status: Secondary | ICD-10-CM | POA: Diagnosis not present

## 2020-02-06 DIAGNOSIS — K660 Peritoneal adhesions (postprocedural) (postinfection): Secondary | ICD-10-CM | POA: Diagnosis not present

## 2020-02-06 DIAGNOSIS — E669 Obesity, unspecified: Secondary | ICD-10-CM | POA: Diagnosis not present

## 2020-02-06 DIAGNOSIS — K219 Gastro-esophageal reflux disease without esophagitis: Secondary | ICD-10-CM | POA: Diagnosis not present

## 2020-02-06 DIAGNOSIS — K831 Obstruction of bile duct: Secondary | ICD-10-CM | POA: Diagnosis not present

## 2020-02-06 DIAGNOSIS — K838 Other specified diseases of biliary tract: Secondary | ICD-10-CM | POA: Diagnosis not present

## 2020-02-06 DIAGNOSIS — E119 Type 2 diabetes mellitus without complications: Secondary | ICD-10-CM | POA: Diagnosis not present

## 2020-02-06 DIAGNOSIS — K811 Chronic cholecystitis: Secondary | ICD-10-CM | POA: Diagnosis not present

## 2020-02-06 DIAGNOSIS — M25511 Pain in right shoulder: Secondary | ICD-10-CM | POA: Diagnosis not present

## 2020-02-06 DIAGNOSIS — Z980 Intestinal bypass and anastomosis status: Secondary | ICD-10-CM | POA: Diagnosis not present

## 2020-02-06 DIAGNOSIS — Z9889 Other specified postprocedural states: Secondary | ICD-10-CM | POA: Diagnosis not present

## 2020-02-06 DIAGNOSIS — Z20822 Contact with and (suspected) exposure to covid-19: Secondary | ICD-10-CM | POA: Diagnosis not present

## 2020-02-06 DIAGNOSIS — K8042 Calculus of bile duct with acute cholecystitis without obstruction: Secondary | ICD-10-CM | POA: Diagnosis not present

## 2020-02-08 ENCOUNTER — Telehealth: Payer: Self-pay

## 2020-02-08 NOTE — Telephone Encounter (Signed)
Reviewed

## 2020-02-08 NOTE — Telephone Encounter (Signed)
Transition Care Management Follow-up Telephone Call  Date of discharge and from where: 02/06/20 from [facility]   How have you been since you were released from the hospital? Patient states,"I am doing well for someone who had two back surgeries in the last month, just a little soreness."  Denies abd pain, flank pain, n/v/d, bloody stool, fever and all other symptoms. Eating/drinking with no problem, input/output appropriate.   Any questions or concerns? None.  Encouraged to call the office if anything new presents.   Items Reviewed:  Did the pt receive and understand the discharge instructions provided? Yes   Medications obtained and verified? Yes , taking all medications as directed without issues.   Any new allergies since your discharge? No   Dietary orders reviewed? Regular  Do you have support at home? Yes , wife  Functional Questionnaire: (I = Independent and D = Dependent) ADLs: wife assist as needed  Maintaining continence- i  Transferring/Ambulation- i  Managing Meds- i  Follow up appointments reviewed:   PCP Hospital f/u appt confirmed? Yes  Scheduled to see Dr. Caryl Bis on 02/18/20 @ 11:30.  [facility] f/u appt confirmed? Yes  Scheduled to see minimal invasive surgery 02/27/20 and gastroenterology 05/06/20.    Are transportation arrangements needed? No   If their condition worsens, is the pt aware to call PCP or go to the Emergency Dept.? Yes  Was the patient provided with contact information for the PCP's office or ED? Yes  Was to pt encouraged to call back with questions or concerns? Yes

## 2020-02-13 DIAGNOSIS — M5416 Radiculopathy, lumbar region: Secondary | ICD-10-CM | POA: Diagnosis not present

## 2020-02-13 DIAGNOSIS — M48061 Spinal stenosis, lumbar region without neurogenic claudication: Secondary | ICD-10-CM | POA: Diagnosis not present

## 2020-02-13 DIAGNOSIS — M4126 Other idiopathic scoliosis, lumbar region: Secondary | ICD-10-CM | POA: Diagnosis not present

## 2020-02-13 DIAGNOSIS — S32010A Wedge compression fracture of first lumbar vertebra, initial encounter for closed fracture: Secondary | ICD-10-CM | POA: Diagnosis not present

## 2020-02-13 DIAGNOSIS — M5440 Lumbago with sciatica, unspecified side: Secondary | ICD-10-CM | POA: Diagnosis not present

## 2020-02-18 ENCOUNTER — Encounter: Payer: Self-pay | Admitting: Family Medicine

## 2020-02-18 ENCOUNTER — Ambulatory Visit: Payer: Medicare PPO | Admitting: Family Medicine

## 2020-02-18 ENCOUNTER — Other Ambulatory Visit: Payer: Self-pay

## 2020-02-18 VITALS — BP 120/70 | HR 80 | Temp 97.9°F | Ht 67.0 in | Wt 175.0 lb

## 2020-02-18 DIAGNOSIS — Z9049 Acquired absence of other specified parts of digestive tract: Secondary | ICD-10-CM

## 2020-02-18 DIAGNOSIS — S32010A Wedge compression fracture of first lumbar vertebra, initial encounter for closed fracture: Secondary | ICD-10-CM | POA: Diagnosis not present

## 2020-02-18 HISTORY — DX: Acquired absence of other specified parts of digestive tract: Z90.49

## 2020-02-18 NOTE — Patient Instructions (Addendum)
Nice to see you. Please keep your follow-up appointment with Dr. Emeterio Reeve at [facility]. I have referred you to see Dr. Alice Reichert locally for GI follow-up. We will obtain labs in about 3 weeks. If you develop significant abdominal pain or you develop signs of infection from your incision sites please seek medical attention immediately.

## 2020-02-18 NOTE — Assessment & Plan Note (Signed)
Patient is doing quite well following cholecystectomy.  Discussed monitoring for any recurrent symptoms and if they occur to seek medical attention.  Will obtain LFTs in about 3 weeks.  Patient has an appointment with general surgery later this month though reports he needs a referral.  This was placed.  We will also refer to his local GI physician at his request.  We will have GI see him to determine when the follow-up MRCP should be completed.

## 2020-02-18 NOTE — Assessment & Plan Note (Signed)
Status post surgical intervention and fusion for this.  He will continue to see neurosurgery and follow their recommendations regarding his restrictions.

## 2020-02-18 NOTE — Progress Notes (Signed)
Justin Rumps, MD Phone: [phone]  GARV KUECHLE is a 69 y.o. male who presents today for hospital follow-up.  Patient was hospitalized from 01/30/2020-02/06/2020.  He was hospitalized for symptomatic cholelithiasis.  He was initially seen in the emergency room at [facility] and then transferred to [facility] as there was nobody locally that would complete the needed ERCP on him.  Once he got to [facility] they did an MRCP that did not reveal any bile duct stones and they subsequently treated him for symptomatic cholelithiasis with a cholecystectomy.  He notes no significant abdominal pain.  Occasionally he has a little uncomfortable sensation in his abdomen.  Some mild nausea.  No vomiting.  His bowel movements are sporadic given that he is taking pain medication though he is on MiraLAX.  He has follow-up with general surgery at [facility] later this month.  He would like to complete GI follow-up and MRCP follow-up locally.  He also underwent a decompressive laminectomy early in June and notes that went well.  He has been a little sore related to that.  He has restrictions on his lifting and reaching and he has difficulty bending given stiffness in his back muscles.  Discharge summary reviewed.  Medications reviewed.  Social History   Tobacco Use  Smoking Status Never Smoker  Smokeless Tobacco Never Used     ROS see history of present illness  Objective  Physical Exam Vitals:   02/18/20 1126  BP: 120/70  Pulse: 80  Temp: 97.9 F (36.6 C)  SpO2: 97%    BP Readings from Last 3 Encounters:  02/18/20 120/70  01/30/20 (!) 158/80  01/29/20 138/88   Wt Readings from Last 3 Encounters:  02/18/20 175 lb (79.4 kg)  01/29/20 181 lb (82.1 kg)  01/28/20 181 lb (82.1 kg)    Physical Exam Constitutional:      General: He is not in acute distress.    Appearance: He is not diaphoretic.  Cardiovascular:     Rate and Rhythm: Normal rate and regular rhythm.      Heart sounds: Normal heart sounds.  Pulmonary:     Effort: Pulmonary effort is normal.     Breath sounds: Normal breath sounds.  Abdominal:     General: Bowel sounds are normal. There is no distension.     Palpations: Abdomen is soft.     Tenderness: There is no abdominal tenderness. There is no guarding or rebound.     Comments: Well-healing laparoscopic port wounds with no signs of infection  Musculoskeletal:     Comments: Well-healing midline scar with no tenderness in his lumbar/thoracic spine  Skin:    General: Skin is warm and dry.  Neurological:     Mental Status: He is alert.      Assessment/Plan: Please see individual problem list.  S/P laparoscopic cholecystectomy Patient is doing quite well following cholecystectomy.  Discussed monitoring for any recurrent symptoms and if they occur to seek medical attention.  Will obtain LFTs in about 3 weeks.  Patient has an appointment with general surgery later this month though reports he needs a referral.  This was placed.  We will also refer to his local GI physician at his request.  We will have GI see him to determine when the follow-up MRCP should be completed.  Fracture of L1 vertebra (HCC) Status post surgical intervention and fusion for this.  He will continue to see neurosurgery and follow their recommendations regarding his restrictions.  Orders Placed This Encounter  Procedures  . Hepatic function panel    Standing Status:   Future    Standing Expiration Date:   02/17/2021  . Ambulatory referral to General Surgery    Referral Priority:   Routine    Referral Type:   Surgical    Referral Reason:   Specialty Services Required    Requested Specialty:   General Surgery    Number of Visits Requested:   1  . Ambulatory referral to Gastroenterology    Referral Priority:   Routine    Referral Type:   Consultation    Referral Reason:   Specialty Services Required    Number of Visits Requested:   1    No orders of the  defined types were placed in this encounter.   This visit occurred during the SARS-CoV-2 public health emergency.  Safety protocols were in place, including screening questions prior to the visit, additional usage of staff PPE, and extensive cleaning of exam room while observing appropriate contact time as indicated for disinfecting solutions.    Justin Rumps, MD [place]

## 2020-02-26 ENCOUNTER — Other Ambulatory Visit: Payer: Self-pay | Admitting: Family Medicine

## 2020-02-27 DIAGNOSIS — Z9049 Acquired absence of other specified parts of digestive tract: Secondary | ICD-10-CM | POA: Diagnosis not present

## 2020-02-27 DIAGNOSIS — Z8719 Personal history of other diseases of the digestive system: Secondary | ICD-10-CM | POA: Diagnosis not present

## 2020-02-27 DIAGNOSIS — Z48815 Encounter for surgical aftercare following surgery on the digestive system: Secondary | ICD-10-CM | POA: Diagnosis not present

## 2020-03-07 ENCOUNTER — Ambulatory Visit: Payer: Medicare PPO | Admitting: Family Medicine

## 2020-03-10 ENCOUNTER — Other Ambulatory Visit: Payer: Self-pay

## 2020-03-10 ENCOUNTER — Other Ambulatory Visit (INDEPENDENT_AMBULATORY_CARE_PROVIDER_SITE_OTHER): Payer: Medicare PPO

## 2020-03-10 DIAGNOSIS — Z9049 Acquired absence of other specified parts of digestive tract: Secondary | ICD-10-CM

## 2020-03-10 LAB — HEPATIC FUNCTION PANEL
ALT: 20 U/L (ref 0–53)
AST: 17 U/L (ref 0–37)
Albumin: 3.9 g/dL (ref 3.5–5.2)
Alkaline Phosphatase: 85 U/L (ref 39–117)
Bilirubin, Direct: 0 mg/dL (ref 0.0–0.3)
Total Bilirubin: 0.2 mg/dL (ref 0.2–1.2)
Total Protein: 6.4 g/dL (ref 6.0–8.3)

## 2020-03-12 DIAGNOSIS — S32010A Wedge compression fracture of first lumbar vertebra, initial encounter for closed fracture: Secondary | ICD-10-CM | POA: Diagnosis not present

## 2020-03-12 DIAGNOSIS — M48061 Spinal stenosis, lumbar region without neurogenic claudication: Secondary | ICD-10-CM | POA: Diagnosis not present

## 2020-03-12 DIAGNOSIS — M5440 Lumbago with sciatica, unspecified side: Secondary | ICD-10-CM | POA: Diagnosis not present

## 2020-03-12 DIAGNOSIS — M4126 Other idiopathic scoliosis, lumbar region: Secondary | ICD-10-CM | POA: Diagnosis not present

## 2020-03-12 DIAGNOSIS — M5416 Radiculopathy, lumbar region: Secondary | ICD-10-CM | POA: Diagnosis not present

## 2020-03-24 DIAGNOSIS — M545 Low back pain: Secondary | ICD-10-CM | POA: Diagnosis not present

## 2020-03-24 DIAGNOSIS — S32010S Wedge compression fracture of first lumbar vertebra, sequela: Secondary | ICD-10-CM | POA: Diagnosis not present

## 2020-03-26 DIAGNOSIS — S32010S Wedge compression fracture of first lumbar vertebra, sequela: Secondary | ICD-10-CM | POA: Diagnosis not present

## 2020-03-26 DIAGNOSIS — M545 Low back pain: Secondary | ICD-10-CM | POA: Diagnosis not present

## 2020-03-31 DIAGNOSIS — S32010S Wedge compression fracture of first lumbar vertebra, sequela: Secondary | ICD-10-CM | POA: Diagnosis not present

## 2020-03-31 DIAGNOSIS — M545 Low back pain: Secondary | ICD-10-CM | POA: Diagnosis not present

## 2020-04-02 ENCOUNTER — Ambulatory Visit: Payer: Medicare Other

## 2020-04-03 DIAGNOSIS — S32010S Wedge compression fracture of first lumbar vertebra, sequela: Secondary | ICD-10-CM | POA: Diagnosis not present

## 2020-04-03 DIAGNOSIS — M545 Low back pain: Secondary | ICD-10-CM | POA: Diagnosis not present

## 2020-04-07 ENCOUNTER — Other Ambulatory Visit: Payer: Self-pay | Admitting: Gastroenterology

## 2020-04-07 DIAGNOSIS — K8689 Other specified diseases of pancreas: Secondary | ICD-10-CM | POA: Diagnosis not present

## 2020-04-07 DIAGNOSIS — Z9049 Acquired absence of other specified parts of digestive tract: Secondary | ICD-10-CM | POA: Diagnosis not present

## 2020-04-07 DIAGNOSIS — K289 Gastrojejunal ulcer, unspecified as acute or chronic, without hemorrhage or perforation: Secondary | ICD-10-CM | POA: Diagnosis not present

## 2020-04-07 DIAGNOSIS — K838 Other specified diseases of biliary tract: Secondary | ICD-10-CM

## 2020-04-07 DIAGNOSIS — Z9884 Bariatric surgery status: Secondary | ICD-10-CM | POA: Diagnosis not present

## 2020-04-08 ENCOUNTER — Telehealth: Payer: Self-pay | Admitting: Family Medicine

## 2020-04-08 DIAGNOSIS — M545 Low back pain: Secondary | ICD-10-CM | POA: Diagnosis not present

## 2020-04-08 DIAGNOSIS — S32010S Wedge compression fracture of first lumbar vertebra, sequela: Secondary | ICD-10-CM | POA: Diagnosis not present

## 2020-04-08 NOTE — Telephone Encounter (Signed)
Please see if the patient is taking methocarbamol.  I got a fax from his insurance advising that this is a high risk medication for side effects.  If he is taking this he should discuss it with the prescribing physician to see if there is an alternative that he could take such as baclofen if it would be appropriate to change his medication.

## 2020-04-09 ENCOUNTER — Ambulatory Visit: Payer: Medicare PPO

## 2020-04-10 DIAGNOSIS — M545 Low back pain: Secondary | ICD-10-CM | POA: Diagnosis not present

## 2020-04-10 DIAGNOSIS — S32010S Wedge compression fracture of first lumbar vertebra, sequela: Secondary | ICD-10-CM | POA: Diagnosis not present

## 2020-04-10 NOTE — Telephone Encounter (Signed)
I called and spoke with the patient and he stated he is not and has not taking that medication in a while he is fine not taking it at this time,  I informed him that if he ever feels he needs it again to remember to inform the prescribing provider that he cannot take it to use an alternative.  He understood.  Dmonte Maher,cma

## 2020-04-15 DIAGNOSIS — S32010S Wedge compression fracture of first lumbar vertebra, sequela: Secondary | ICD-10-CM | POA: Diagnosis not present

## 2020-04-15 DIAGNOSIS — M545 Low back pain: Secondary | ICD-10-CM | POA: Diagnosis not present

## 2020-04-17 DIAGNOSIS — M545 Low back pain: Secondary | ICD-10-CM | POA: Diagnosis not present

## 2020-04-17 DIAGNOSIS — S32010S Wedge compression fracture of first lumbar vertebra, sequela: Secondary | ICD-10-CM | POA: Diagnosis not present

## 2020-04-21 ENCOUNTER — Other Ambulatory Visit: Payer: Self-pay | Admitting: Neurology

## 2020-04-22 DIAGNOSIS — S32010S Wedge compression fracture of first lumbar vertebra, sequela: Secondary | ICD-10-CM | POA: Diagnosis not present

## 2020-04-22 DIAGNOSIS — M545 Low back pain: Secondary | ICD-10-CM | POA: Diagnosis not present

## 2020-04-24 DIAGNOSIS — S32010S Wedge compression fracture of first lumbar vertebra, sequela: Secondary | ICD-10-CM | POA: Diagnosis not present

## 2020-04-24 DIAGNOSIS — M545 Low back pain: Secondary | ICD-10-CM | POA: Diagnosis not present

## 2020-04-28 DIAGNOSIS — S32010S Wedge compression fracture of first lumbar vertebra, sequela: Secondary | ICD-10-CM | POA: Diagnosis not present

## 2020-04-28 DIAGNOSIS — M545 Low back pain: Secondary | ICD-10-CM | POA: Diagnosis not present

## 2020-04-30 DIAGNOSIS — S32010A Wedge compression fracture of first lumbar vertebra, initial encounter for closed fracture: Secondary | ICD-10-CM | POA: Diagnosis not present

## 2020-04-30 DIAGNOSIS — M5440 Lumbago with sciatica, unspecified side: Secondary | ICD-10-CM | POA: Diagnosis not present

## 2020-04-30 DIAGNOSIS — M48061 Spinal stenosis, lumbar region without neurogenic claudication: Secondary | ICD-10-CM | POA: Diagnosis not present

## 2020-04-30 DIAGNOSIS — M5416 Radiculopathy, lumbar region: Secondary | ICD-10-CM | POA: Diagnosis not present

## 2020-05-08 ENCOUNTER — Ambulatory Visit
Admission: RE | Admit: 2020-05-08 | Discharge: 2020-05-08 | Disposition: A | Discharge status: Home / Self Care | Payer: Medicare PPO | Source: Ambulatory Visit | Attending: Gastroenterology | Admitting: Gastroenterology

## 2020-05-08 ENCOUNTER — Other Ambulatory Visit: Payer: Self-pay | Admitting: Gastroenterology

## 2020-05-08 ENCOUNTER — Other Ambulatory Visit: Payer: Self-pay

## 2020-05-08 DIAGNOSIS — K838 Other specified diseases of biliary tract: Secondary | ICD-10-CM | POA: Diagnosis not present

## 2020-05-08 DIAGNOSIS — R935 Abnormal findings on diagnostic imaging of other abdominal regions, including retroperitoneum: Secondary | ICD-10-CM | POA: Diagnosis not present

## 2020-05-08 MED ORDER — GADOBUTROL 1 MMOL/ML IV SOLN
7.5000 mL | Freq: Once | INTRAVENOUS | Status: AC | PRN
  Administered 2020-05-08: 7.5 mL via INTRAVENOUS

## 2020-05-20 ENCOUNTER — Encounter: Payer: Self-pay | Admitting: Family Medicine

## 2020-05-20 ENCOUNTER — Other Ambulatory Visit: Payer: Self-pay

## 2020-05-20 ENCOUNTER — Ambulatory Visit (INDEPENDENT_AMBULATORY_CARE_PROVIDER_SITE_OTHER): Payer: Medicare PPO | Admitting: Family Medicine

## 2020-05-20 VITALS — BP 120/80 | HR 96 | Temp 98.0°F | Ht 67.0 in | Wt 186.0 lb

## 2020-05-20 DIAGNOSIS — Z23 Encounter for immunization: Secondary | ICD-10-CM

## 2020-05-20 DIAGNOSIS — K838 Other specified diseases of biliary tract: Secondary | ICD-10-CM | POA: Diagnosis not present

## 2020-05-20 DIAGNOSIS — I10 Essential (primary) hypertension: Secondary | ICD-10-CM | POA: Diagnosis not present

## 2020-05-20 DIAGNOSIS — E119 Type 2 diabetes mellitus without complications: Secondary | ICD-10-CM | POA: Diagnosis not present

## 2020-05-20 DIAGNOSIS — M5136 Other intervertebral disc degeneration, lumbar region: Secondary | ICD-10-CM | POA: Diagnosis not present

## 2020-05-20 DIAGNOSIS — I700 Atherosclerosis of aorta: Secondary | ICD-10-CM | POA: Diagnosis not present

## 2020-05-20 DIAGNOSIS — E663 Overweight: Secondary | ICD-10-CM | POA: Diagnosis not present

## 2020-05-20 LAB — POCT GLYCOSYLATED HEMOGLOBIN (HGB A1C): Hemoglobin A1C: 6.5 % — AB (ref 4.0–5.6)

## 2020-05-20 NOTE — Progress Notes (Signed)
[name], MD Phone: [phone]  Justin Patel is a 69 y.o. male who presents today for f/u.  DIABETES Disease Monitoring: Blood Sugar ranges-not checking Polyuria/phagia/dipsia- no      Optho- due Medications: Compliance- taking tradjenta Hypoglycemic symptoms- rare, eats and improves  HYPERTENSION  Disease Monitoring  Home BP Monitoring 120/70 Chest pain- no    Dyspnea- no Medications  Compliance-  Taking amlodipine, lasix, metoprolol.  Edema- no  Overweight: Patient does floor biking 30 minutes a day.  Tries to eat relatively healthy though does eat cookies.  Common bile duct dilatation: Recently had an MRI looking at this.  Notes there was a persistently dilated common bile duct.  Possible stone or intraluminal nodule at the ampulla.  No masses or suspicious lesions.  He has been following with GI.  Notes rare pain when he eats.  Chronic back pain: He continues to follow with neurosurgery.  Takes tramadol which does benefit him some.  Occasionally takes Tylenol as well.  Takes 1 ibuprofen per day.  He did physical therapy in the pool without any significant benefit.  He is to follow-up with his surgeon in November.     Social History   Tobacco Use  Smoking Status Never Smoker  Smokeless Tobacco Never Used     ROS see history of present illness  Objective  Physical Exam Vitals:   05/20/20 1236  BP: 120/80  Pulse: 96  Temp: 98 F (36.7 C)  SpO2: 99%    BP Readings from Last 3 Encounters:  05/20/20 120/80  02/18/20 120/70  01/30/20 (!) 158/80   Wt Readings from Last 3 Encounters:  05/20/20 186 lb (84.4 kg)  02/18/20 175 lb (79.4 kg)  01/29/20 181 lb (82.1 kg)    Physical Exam Constitutional:      General: He is not in acute distress.    Appearance: He is not diaphoretic.  Cardiovascular:     Rate and Rhythm: Normal rate and regular rhythm.     Heart sounds: Normal heart sounds.  Pulmonary:     Effort: Pulmonary effort is normal.      Breath sounds: Normal breath sounds.  Musculoskeletal:     Right lower leg: No edema.     Left lower leg: No edema.     Comments: Slight muscular tenderness on the right side of his thoracic spine  Skin:    General: Skin is warm and dry.  Neurological:     Mental Status: He is alert.      Assessment/Plan: Please see individual problem list.  Problem List Items Addressed This Visit    Aortic atherosclerosis ([place])    Continue risk factor management.      Common bile duct dilatation    Ongoing issue.  I will send a message to his GI team to get their input on the next step for this.      DDD (degenerative disc disease), lumbar    He will continue to see his neurosurgeon.      Relevant Medications   cyclobenzaprine (FLEXERIL) 5 MG tablet   traMADol (ULTRAM) 50 MG tablet   Diabetes (HCC)    Check A1c.  Continue Tradjenta 5 mg once daily.  Urine microalbumin today.      Essential hypertension, benign    Adequate control.  Continue amlodipine 10 mg once daily, Lasix 20 mg once daily, and metoprolol 50 mg once daily.      Overweight (BMI 25.0-29.9)    Encouraged continued exercise as he is  able to and as approved by neurosurgery.  Continue to monitor his diet and discussed decreasing cookie intake.       Other Visit Diagnoses    Need for immunization against influenza    -  Primary   Relevant Orders   Flu Vaccine QUAD High Dose(Fluad) (Completed)       Health Maintenance: encouraged to see ophthalmology for yearly eye exam.    This visit occurred during the SARS-CoV-2 public health emergency.  Safety protocols were in place, including screening questions prior to the visit, additional usage of staff PPE, and extensive cleaning of exam room while observing appropriate contact time as indicated for disinfecting solutions.    [name], MD [place]

## 2020-05-20 NOTE — Patient Instructions (Signed)
Nice to see you. Please continue to monitor your blood pressure. GI will reach out to you regarding the next steps for your common bile duct.

## 2020-05-20 NOTE — Assessment & Plan Note (Signed)
He will continue to see his neurosurgeon.

## 2020-05-20 NOTE — Addendum Note (Signed)
Addended by: Fulton Mole D on: 05/20/2020 01:17 PM   Modules accepted: Orders

## 2020-05-20 NOTE — Assessment & Plan Note (Signed)
Check A1c.  Continue Tradjenta 5 mg once daily.  Urine microalbumin today.

## 2020-05-20 NOTE — Assessment & Plan Note (Signed)
Continue risk factor management. 

## 2020-05-20 NOTE — Assessment & Plan Note (Signed)
Encouraged continued exercise as he is able to and as approved by neurosurgery.  Continue to monitor his diet and discussed decreasing cookie intake.

## 2020-05-20 NOTE — Assessment & Plan Note (Signed)
Adequate control.  Continue amlodipine 10 mg once daily, Lasix 20 mg once daily, and metoprolol 50 mg once daily.

## 2020-05-20 NOTE — Assessment & Plan Note (Signed)
Ongoing issue.  I will send a message to his GI team to get their input on the next step for this.

## 2020-05-23 ENCOUNTER — Other Ambulatory Visit: Payer: Self-pay | Admitting: Family Medicine

## 2020-06-16 DIAGNOSIS — K838 Other specified diseases of biliary tract: Secondary | ICD-10-CM | POA: Diagnosis not present

## 2020-06-16 DIAGNOSIS — K8689 Other specified diseases of pancreas: Secondary | ICD-10-CM | POA: Diagnosis not present

## 2020-06-19 ENCOUNTER — Ambulatory Visit (INDEPENDENT_AMBULATORY_CARE_PROVIDER_SITE_OTHER): Payer: Medicare PPO

## 2020-06-19 VITALS — Ht 67.0 in | Wt 173.0 lb

## 2020-06-19 DIAGNOSIS — Z0000 Encounter for general adult medical examination without abnormal findings: Secondary | ICD-10-CM | POA: Diagnosis not present

## 2020-06-19 NOTE — Progress Notes (Addendum)
Subjective:   Justin Patel is a 69 y.o. male who presents for Medicare Annual/Subsequent preventive examination.  Review of Systems    No ROS.  Medicare Wellness Virtual Visit.    Cardiac Risk Factors include: advanced age (>25mn, >>52women);hypertension;diabetes mellitus;male gender     Objective:    Today's Vitals   06/19/20 1338  Weight: 173 lb (78.5 kg)  Height: 5' 7"  (1.702 m)   Body mass index is 27.1 kg/m.  Advanced Directives 06/19/2020 01/29/2020 01/28/2020 01/14/2020 11/06/2019 07/31/2019 04/02/2019  Does Patient Have a Medical Advance Directive? Yes No No No No Yes Yes  Type of AParamedicof [place]reck[place]ridgeLiving will - - - - - HPress photographerLiving will  Does patient want to make changes to medical advance directive? No - Patient declined - - - - - No - Patient declined  Copy of [place] Chart? No - copy requested - - - - - No - copy requested  Would patient like information on creating a medical advance directive? - No - Patient declined No - Patient declined No - Patient declined - - -  Pre-existing out of facility DNR order (yellow form or pink MOST form) - - - - - - -    Current Medications (verified) Outpatient Encounter Medications as of 06/19/2020  Medication Sig  . amLODipine (NORVASC) 10 MG tablet TAKE ONE TABLET EVERY DAY  . blood glucose meter kit and supplies KIT Dispense based on patient and insurance preference. Check once daily. ICD doing E11.9.  . Calcium Carbonate-Vitamin D (CALCIUM 600/VITAMIN D PO) Take 1 tablet by mouth daily.  . Cholecalciferol (VITAMIN D) 50 MCG (2000 UT) CAPS Take 4,000 Units by mouth daily.  . cyclobenzaprine (FLEXERIL) 5 MG tablet Take 5-10 mg by mouth every 8 (eight) hours as needed.  . DULoxetine (CYMBALTA) 60 MG capsule TAKE 1 CAPSULE BY MOUTH EVERY DAY  . esomeprazole (NEXIUM) 40 MG capsule TAKE 1 CAPSULE BY MOUTH ONCE DAILY (Patient taking differently: Take 40 mg  by mouth daily. )  . furosemide (LASIX) 20 MG tablet TAKE ONE TABLET BY MOUTH EVERY DAY (Patient taking differently: Take 20 mg by mouth daily. )  . HYDROcodone-acetaminophen (NORCO/VICODIN) 5-325 MG tablet Take 1 tablet by mouth every 6 (six) hours as needed for moderate pain.  . methocarbamol (ROBAXIN) 500 MG tablet Take 1 tablet (500 mg total) by mouth every 6 (six) hours as needed for muscle spasms.  . metoprolol succinate (TOPROL-XL) 50 MG 24 hr tablet TAKE 1 TABLET BY MOUTH DAILY WITH OR IMMEDIATLY FOLLOWING A MEAL (Patient taking differently: Take 50 mg by mouth daily. )  . Multiple Vitamin (MULTIVITAMIN) capsule Take 1 capsule by mouth daily. With copper and zinc  . nystatin cream (MYCOSTATIN) Apply 1 application topically 2 (two) times daily.  .Marland Kitchennystatin-triamcinolone ointment (MYCOLOG) Apply 1 application topically 2 (two) times daily.  . primidone (MYSOLINE) 50 MG tablet TAKE 1/2 TABLET AT BEDTIME FOR 7 DAYS THEN INCREASE TO TAKE ONE TABLET AT BEDTIME  . rosuvastatin (CRESTOR) 40 MG tablet TAKE ONE TABLET EVERY DAY (Patient taking differently: Take 40 mg by mouth daily. )  . sucralfate (CARAFATE) 1 g tablet TAKE ONE TABLET BY MOUTH FOUR TIMES DAILY  . tamsulosin (FLOMAX) 0.4 MG CAPS capsule TAKE 1 CAPSULE BY MOUTH EVERY DAY (Patient taking differently: Take 0.4 mg by mouth daily. )  . TRADJENTA 5 MG TABS tablet TAKE ONE TABLET BY MOUTH EVERY DAY  .  traMADol (ULTRAM) 50 MG tablet Take 50 mg by mouth every 6 (six) hours as needed.  . Vitamin D, Ergocalciferol, (DRISDOL) 1.25 MG (50000 UT) CAPS capsule TAKE 1 CAPSULE EVERY 7 DAYS   No facility-administered encounter medications on file as of 06/19/2020.    Allergies (verified) Altace [ramipril], Levaquin [levofloxacin in d5w], Levofloxacin, Conray [iothalamate], Lyrica [pregabalin], Metformin, and Trulicity [dulaglutide]   History: Past Medical History:  Diagnosis Date  . 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 Anxiety     . Arthritis   . 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  removal scheduled to clear margins.  . CHF (congestive heart failure) ([place])    no longer after weight loss  . Chronic kidney disease    cysts  . Degenerative disc disease   . Diabetes mellitus    no longer diabetic-or on meds  . Family history of adverse reaction to anesthesia    sons wake up combative  . Gastric ulcer   . GERD (gastroesophageal reflux disease)   . H/O hiatal hernia   . Headache(784.0)    sinus  . Heart murmur   . Hx of congestive heart failure   . Hyperlipidemia   . Hypertension   . Iron deficiency anemia 02/28/2015  . Opiate abuse, continuous ([place]) 06/20/2015  . Sacral fracture, closed ([place])   . Seizures ([place])    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, after GI bleed  . Sleep apnea    hx not now since wt loss  . Stones in the urinary tract   . Syncope and collapse    Past Surgical History:  Procedure Laterality Date  . ANTERIOR CERVICAL DECOMP/DISCECTOMY FUSION  01/26/2012   Procedure: ANTERIOR CERVICAL DECOMPRESSION/DISCECTOMY FUSION 3 LEVELS;  Surgeon: Floyce Stakes, MD;  Location: [facility];  Service: Neurosurgery;  Laterality: N/A;  Cervical three-four Cervical four-five Cervical five-six Cervical six-seven , Anterior cervical decompression/diskectomy, fusion, plate  . ANTERIOR LAT LUMBAR FUSION Right 04/25/2018   Procedure: Right Lumbar two-three Lumbar three-four Lumbar four-five anterior  lateral interbody fusion  with posterior percutaneous pedicle screws;  Surgeon: Erline Levine, MD;  Location: [facility];  Service: Neurosurgery;  Laterality: Right;  . APPENDECTOMY    . [place], normal  . CARDIAC CATHETERIZATION     [place]  . COLONOSCOPY WITH PROPOFOL N/A 12/27/2017   Procedure: COLONOSCOPY WITH PROPOFOL;  Surgeon: Toledo, Benay Pike, MD;  Location: [facility];  Service: Gastroenterology;  Laterality: N/A;  .  ESOPHAGOGASTRODUODENOSCOPY (EGD) WITH PROPOFOL N/A 12/27/2017   Procedure: ESOPHAGOGASTRODUODENOSCOPY (EGD) WITH PROPOFOL;  Surgeon: Toledo, Benay Pike, MD;  Location: [facility] END[facility];  Service: Gastroenterology;  Laterality: N/A;  . GASTRIC BYPASS  2010   [place]  . HERNIA REPAIR  2000   hiatal  . JOINT REPLACEMENT    . KNEE ARTHROSCOPY     bilateral, left x 2  . LAMINECTOMY WITH POSTERIOR LATERAL ARTHRODESIS LEVEL 4 N/A 01/14/2020   Procedure: Decompressive Laminectomy Lumbar One with pedicle screw fixation from Thoracic Ten to Lumbar Two;  Surgeon: Erline Levine, MD;  Location: [facility];  Service: Neurosurgery;  Laterality: N/A;  Decompressive Laminectomy Lumbar One with pedicle screw fixation from Thoracic Ten to Lumbar Two  . LUMBAR PERCUTANEOUS PEDICLE SCREW 3 LEVEL N/A 04/25/2018   Procedure: LUMBAR PERCUTANEOUS PEDICLE SCREW 3 LEVEL;  Surgeon: Erline Levine, MD;  Location: [facility];  Service: Neurosurgery;  Laterality:  N/A;  . NASAL SINUS SURGERY     x5  . OSTEOTOMY  2001   left  . TONSILLECTOMY    . TOTAL KNEE ARTHROPLASTY Left 05/2014   Dr. Marry Guan  . UVULOPALATOPLASTY  2011   Family History  Problem Relation Age of Onset  . Dementia Mother 82  . Osteoporosis Mother   . Heart disease Father 18  . Diabetes Father   . Lymphoma Sister        lymphoma, stage 4  . Ovarian cancer Sister 59       Ovarian  . Lupus Sister   . Prostate cancer Maternal Uncle   . Skin cancer Maternal Uncle   . Tongue cancer Maternal Uncle        uncle died of heart attack  . Alcoholism Maternal Uncle   . Lung cancer Paternal Aunt        pat aunts x 4 died of lung cancer  . Lung cancer Paternal Aunt   . Lung cancer Paternal Aunt   . Lung cancer Paternal Aunt   . Mesothelioma Cousin        paternal cousin   Social History   Socioeconomic History  . Marital status: Married    Spouse name: Not on file  . Number of children: 2  . Years of education: Not on file  . Highest education level: Not  on file  Occupational History  . Not on file  Tobacco Use  . Smoking status: Never Smoker  . Smokeless tobacco: Never Used  Vaping Use  . Vaping Use: Never used  Substance and Sexual Activity  . Alcohol use: No  . Drug use: No  . Sexual activity: Yes    Partners: Female  Other Topics Concern  . Not on file  Social History Narrative   Lives in [place] with wife, [place]. 2 sons, William Hamburger, Lennette Bihari 52.. 4 grandchildren      Work - Retired, previously Nurse, learning disability in [place] - regular diet, limited quantities after gastric bypass      Exercise - no regular, limited by arthritis in knees, occasional water aerobics   Right handed   One story house   Social Determinants of Health   Financial Resource Strain: Low Risk   . Difficulty of Paying Living Expenses: Not hard at all  Food Insecurity: No Food Insecurity  . Worried About Charity fundraiser in the Last Year: Never true  . Ran Out of Food in the Last Year: Never true  Transportation Needs: No Transportation Needs  . Lack of Transportation (Medical): No  . Lack of Transportation (Non-Medical): No  Physical Activity:   . Days of Exercise per Week: Not on file  . Minutes of Exercise per Session: Not on file  Stress: No Stress Concern Present  . Feeling of Stress : Not at all  Social Connections: Unknown  . Frequency of Communication with Friends and Family: Not on file  . Frequency of Social Gatherings with Friends and Family: More than three times a week  . Attends Religious Services: Not on file  . Active Member of Clubs or Organizations: Not on file  . Attends Archivist Meetings: Not on file  . Marital Status: Married    Tobacco Counseling Counseling given: Not Answered   Clinical Intake:  Pre-visit preparation completed: Yes       Diabetes: Yes (Followed by pcp and Endocrinology)  How often do you  need to have someone help you when you read instructions, pamphlets,  or other written materials from your doctor or pharmacy?: 1 - Never   Nutrition Risk Assessment: Does the patient have any non-healing wounds?  No  Has the patient had any unintentional weight loss or weight gain?  No   Diabetes: If diabetic, was a CBG obtained today?  No  Did the patient bring in their glucometer from home?  No . Virtual visit How often do you monitor your CBG's? About twice weekly.   Financial Strains and Diabetes Management: Are you having any financial strains with the device, your supplies or your medication? No .  Does the patient want to be seen by Chronic Care Management for management of their diabetes?  No  Would the patient like to be referred to a Nutritionist or for Diabetic Management?  No   Interpreter Needed?: No    Activities of Daily Living In your present state of health, do you have any difficulty performing the following activities: 06/19/2020 01/29/2020  Hearing? Y N  Comment hearing aids -  Vision? N N  Difficulty concentrating or making decisions? N N  Walking or climbing stairs? N N  Comment Paces self -  Dressing or bathing? N N  Doing errands, shopping? N N  Preparing Food and eating ? Y -  Comment Wife assist with meal prep. Self feeds. -  Using the Toilet? N -  In the past six months, have you accidently leaked urine? N -  Do you have problems with loss of bowel control? N -  Managing your Medications? N -  Managing your Finances? N -  Housekeeping or managing your Housekeeping? Y -  Comment Wife assist. He paces himself. 5lb weight lift limit -  Some recent data might be hidden    Patient Care Team: [name], MD as PCP - General (Family Medicine) Pieter Partridge, DO as Consulting Physician (Neurology)  Indicate any recent Medical Services you may have received from other than [facility] providers in the past year (date may be approximate).     Assessment:   This is a routine wellness examination for [place].  I  connected with Cai today by telephone and verified that I am speaking with the correct person using two identifiers. Location patient: home Location provider: work Persons participating in the virtual visit: patient, Marine scientist.    I discussed the limitations, risks, security and privacy concerns of performing an evaluation and management service by telephone and the availability of in person appointments. The patient expressed understanding and verbally consented to this telephonic visit.    Interactive audio and video telecommunications were attempted between this provider and patient, however failed, due to patient having technical difficulties OR patient did not have access to video capability.  We continued and completed visit with audio only.  Some vital signs may be absent or patient reported.   Hearing/Vision screen  Hearing Screening   125Hz  250Hz  500Hz  1000Hz  2000Hz  3000Hz  4000Hz  6000Hz  8000Hz   Right ear:           Left ear:           Comments: Hearing aids, bilateral  Vision Screening Comments: Followed by [facility]  Wears corrective lenses  They have regular follow up with the ophthalmologist  Dietary issues and exercise activities discussed: Type of exercise: stretching;walking  Low fat diet Good water intake  Goals    . Increase physical activity     Add aquatic  exercise       Depression Screen PHQ 2/9 Scores 06/19/2020 05/20/2020 02/18/2020 08/14/2019 05/01/2019 04/26/2019 04/02/2019  PHQ - 2 Score 0 0 0 0 0 0 0  PHQ- 9 Score - - - - - 0 -  Exception Documentation - - - - - - -    Fall Risk Fall Risk  06/19/2020 05/20/2020 07/31/2019 05/01/2019 04/26/2019  Falls in the past year? 0 0 1 0 0  Comment - - - - -  Number falls in past yr: 0 0 0 0 0  Injury with Fall? 0 - 0 - 0  Comment - - - - -  Risk Factor Category  - - - - -  Risk for fall due to : - - - - -  Follow up Falls evaluation completed Falls evaluation completed - Falls evaluation completed Falls  evaluation completed  Comment - - - - -   Handrails in use when climbing stairs? Yes Home free of loose throw rugs in walkways, pet beds, electrical cords, etc? Yes  Adequate lighting in your home to reduce risk of falls? Yes   ASSISTIVE DEVICES UTILIZED TO PREVENT FALLS: Use of a cane, walker or w/c? No   TIMED UP AND GO: Was the test performed? No . Virtual visit.   Cognitive Function: Patient is alert and oriented x3.  Denies difficulty focusing, making decisions, memory loss.  Enjoys playing musical instruments, speaking a foreign language and other brain health activities.  MMSE/6CIT deferred. Normal by direct communication/observation.   MMSE - Mini Mental State Exam 03/31/2018 03/30/2017  Orientation to time 5 5  Orientation to Place 5 5  Registration 3 3  Attention/ Calculation 5 5  Recall 3 3  Language- name 2 objects 2 2  Language- repeat 1 1  Language- follow 3 step command 3 3  Language- read & follow direction 1 1  Write a sentence 1 1  Copy design 1 1  Total score 30 30     6CIT Screen 04/02/2019  What Year? 0 points  What month? 0 points  What time? 0 points  Count back from 20 0 points  Months in reverse 0 points  Repeat phrase 0 points  Total Score 0    Immunizations Immunization History  Administered Date(s) Administered  . Fluad Quad(high Dose 65+) 05/01/2019, 05/20/2020  . Influenza, High Dose Seasonal PF 06/25/2016, 05/08/2018  . Influenza,inj,Quad PF,6+ Mos 07/10/2013, 04/05/2017  . Influenza-Unspecified 05/21/2012, 05/29/2014, 04/10/2015, 04/06/2017  . PFIZER SARS-COV-2 Vaccination 10/05/2019, 10/30/2019  . Pneumococcal Conjugate-13 06/13/2014  . Pneumococcal Polysaccharide-23 03/21/2010, 04/05/2017  . Tdap 06/03/2014  . Zoster 05/10/2013    Health Maintenance Health Maintenance  Topic Date Due  . URINE MICROALBUMIN  02/19/2015  . FOOT EXAM  05/09/2019  . OPHTHALMOLOGY EXAM  04/10/2020  . HEMOGLOBIN A1C  11/18/2020  . TETANUS/TDAP   06/03/2024  . COLONOSCOPY  12/28/2027  . INFLUENZA VACCINE  Completed  . COVID-19 Vaccine  Completed  . Hepatitis C Screening  Completed  . PNA vac Low Risk Adult  Completed   Colorectal cancer screening: Completed 12/27/17. Repeat every 10 years.  Lung Cancer Screening: (Low Dose CT Chest recommended if Age 17-80 years, 30 pack-year currently smoking OR have quit w/in 15years.) does not qualify.    Hepatitis C Screening: Completed 03/30/17.  Dental Screening: Recommended annual dental exams for proper oral hygiene.  Community Resource Referral / Chronic Care Management: CRR required this visit?  No   CCM required this visit?  No      Plan:   Keep all routine maintenance appointments.   Follow up 11/18/20  I have personally reviewed and noted the following in the patient's chart:   . Medical and social history . Use of alcohol, tobacco or illicit drugs  . Current medications and supplements . Functional ability and status . Nutritional status . Physical activity . Advanced directives . List of other physicians . Hospitalizations, surgeries, and ER visits in previous 12 months . Vitals . Screenings to include cognitive, depression, and falls . Referrals and appointments  In addition, I have reviewed and discussed with patient certain preventive protocols, quality metrics, and best practice recommendations. A written personalized care plan for preventive services as well as general preventive health recommendations were provided to patient via mychart.     Varney Biles, LPN   80/32/1224   Agree Dr. Olivia Mackie McLean-scocuzza

## 2020-06-19 NOTE — Patient Instructions (Signed)
Justin Patel , Thank you for taking time to come for your Medicare Wellness Visit. I appreciate your ongoing commitment to your health goals. Please review the following plan we discussed and let me know if I can assist you in the future.   These are the goals we discussed: Goals    . Increase physical activity     Add aquatic exercise        This is a list of the screening recommended for you and due dates:  Health Maintenance  Topic Date Due  . Urine Protein Check  02/19/2015  . Complete foot exam   05/09/2019  . Eye exam for diabetics  04/10/2020  . Hemoglobin A1C  11/18/2020  . Tetanus Vaccine  06/03/2024  . Colon Cancer Screening  12/28/2027  . Flu Shot  Completed  . COVID-19 Vaccine  Completed  .  Hepatitis C: One time screening is recommended by Center for Disease Control  (CDC) for  adults born from [birth year] through 1965.   Completed  . Pneumonia vaccines  Completed    Immunizations Immunization History  Administered Date(s) Administered  . Fluad Quad(high Dose 65+) 05/01/2019, 05/20/2020  . Influenza, High Dose Seasonal PF 06/25/2016, 05/08/2018  . Influenza,inj,Quad PF,6+ Mos 07/10/2013, 04/05/2017  . Influenza-Unspecified 05/21/2012, 05/29/2014, 04/10/2015, 04/06/2017  . PFIZER SARS-COV-2 Vaccination 10/05/2019, 10/30/2019  . Pneumococcal Conjugate-13 06/13/2014  . Pneumococcal Polysaccharide-23 03/21/2010, 04/05/2017  . Tdap 06/03/2014  . Zoster 05/10/2013   Keep all routine maintenance appointments.   Follow up 11/18/20  Follow up in one year for your annual wellness visit.   Preventive Care 69 Years and Older, Male Preventive care refers to lifestyle choices and visits with your health care provider that can promote health and wellness. What does preventive care include?  A yearly physical exam. This is also called an annual well check.  Dental exams once or twice a year.  Routine eye exams. Ask your health care provider how often you should have your  eyes checked.  Personal lifestyle choices, including:  Daily care of your teeth and gums.  Regular physical activity.  Eating a healthy diet.  Avoiding tobacco and drug use.  Limiting alcohol use.  Practicing safe sex.  Taking low doses of aspirin every day.  Taking vitamin and mineral supplements as recommended by your health care provider. What happens during an annual well check? The services and screenings done by your health care provider during your annual well check will depend on your age, overall health, lifestyle risk factors, and family history of disease. Counseling  Your health care provider may ask you questions about your:  Alcohol use.  Tobacco use.  Drug use.  Emotional well-being.  Home and relationship well-being.  Sexual activity.  Eating habits.  History of falls.  Memory and ability to understand (cognition).  Work and work Statistician. Screening  You may have the following tests or measurements:  Height, weight, and BMI.  Blood pressure.  Lipid and cholesterol levels. These may be checked every 5 years, or more frequently if you are over 57 years old.  Skin check.  Lung cancer screening. You may have this screening every year starting at age 65 if you have a 30-pack-year history of smoking and currently smoke or have quit within the past 15 years.  Fecal occult blood test (FOBT) of the stool. You may have this test every year starting at age 59.  Flexible sigmoidoscopy or colonoscopy. You may have a sigmoidoscopy every 5 years or a  colonoscopy every 10 years starting at age 56.  Prostate cancer screening. Recommendations will vary depending on your family history and other risks.  Hepatitis C blood test.  Hepatitis B blood test.  Sexually transmitted disease (STD) testing.  Diabetes screening. This is done by checking your blood sugar (glucose) after you have not eaten for a while (fasting). You may have this done every 1-3  years.  Abdominal aortic aneurysm (AAA) screening. You may need this if you are a current or former smoker.  Osteoporosis. You may be screened starting at age 73 if you are at high risk. Talk with your health care provider about your test results, treatment options, and if necessary, the need for more tests. Vaccines  Your health care provider may recommend certain vaccines, such as:  Influenza vaccine. This is recommended every year.  Tetanus, diphtheria, and acellular pertussis (Tdap, Td) vaccine. You may need a Td booster every 10 years.  Zoster vaccine. You may need this after age 73.  Pneumococcal 13-valent conjugate (PCV13) vaccine. One dose is recommended after age 58.  Pneumococcal polysaccharide (PPSV23) vaccine. One dose is recommended after age 31. Talk to your health care provider about which screenings and vaccines you need and how often you need them. This information is not intended to replace advice given to you by your health care provider. Make sure you discuss any questions you have with your health care provider. Document Released: 08/22/2015 Document Revised: 04/14/2016 Document Reviewed: 05/27/2015 Elsevier Interactive Patient Education  2017 Justin Patel Prevention in the Home Falls can cause injuries. They can happen to people of all ages. There are many things you can do to make your home safe and to help prevent falls. What can I do on the outside of my home?  Regularly fix the edges of walkways and driveways and fix any cracks.  Remove anything that might make you trip as you walk through a door, such as a raised step or threshold.  Trim any bushes or trees on the path to your home.  Use bright outdoor lighting.  Clear any walking paths of anything that might make someone trip, such as rocks or tools.  Regularly check to see if handrails are loose or broken. Make sure that both sides of any steps have handrails.  Any raised decks and porches  should have guardrails on the edges.  Have any leaves, snow, or ice cleared regularly.  Use sand or salt on walking paths during winter.  Clean up any spills in your garage right away. This includes oil or grease spills. What can I do in the bathroom?  Use night lights.  Install grab bars by the toilet and in the tub and shower. Do not use towel bars as grab bars.  Use non-skid mats or decals in the tub or shower.  If you need to sit down in the shower, use a plastic, non-slip stool.  Keep the floor dry. Clean up any water that spills on the floor as soon as it happens.  Remove soap buildup in the tub or shower regularly.  Attach bath mats securely with double-sided non-slip rug tape.  Do not have throw rugs and other things on the floor that can make you trip. What can I do in the bedroom?  Use night lights.  Make sure that you have a light by your bed that is easy to reach.  Do not use any sheets or blankets that are too big for your bed. They  should not hang down onto the floor.  Have a firm chair that has side arms. You can use this for support while you get dressed.  Do not have throw rugs and other things on the floor that can make you trip. What can I do in the kitchen?  Clean up any spills right away.  Avoid walking on wet floors.  Keep items that you use a lot in easy-to-reach places.  If you need to reach something above you, use a strong step stool that has a grab bar.  Keep electrical cords out of the way.  Do not use floor polish or wax that makes floors slippery. If you must use wax, use non-skid floor wax.  Do not have throw rugs and other things on the floor that can make you trip. What can I do with my stairs?  Do not leave any items on the stairs.  Make sure that there are handrails on both sides of the stairs and use them. Fix handrails that are broken or loose. Make sure that handrails are as long as the stairways.  Check any carpeting to  make sure that it is firmly attached to the stairs. Fix any carpet that is loose or worn.  Avoid having throw rugs at the top or bottom of the stairs. If you do have throw rugs, attach them to the floor with carpet tape.  Make sure that you have a light switch at the top of the stairs and the bottom of the stairs. If you do not have them, ask someone to add them for you. What else can I do to help prevent falls?  Wear shoes that:  Do not have high heels.  Have rubber bottoms.  Are comfortable and fit you well.  Are closed at the toe. Do not wear sandals.  If you use a stepladder:  Make sure that it is fully opened. Do not climb a closed stepladder.  Make sure that both sides of the stepladder are locked into place.  Ask someone to hold it for you, if possible.  Clearly mark and make sure that you can see:  Any grab bars or handrails.  First and last steps.  Where the edge of each step is.  Use tools that help you move around (mobility aids) if they are needed. These include:  Canes.  Walkers.  Scooters.  Crutches.  Turn on the lights when you go into a dark area. Replace any light bulbs as soon as they burn out.  Set up your furniture so you have a clear path. Avoid moving your furniture around.  If any of your floors are uneven, fix them.  If there are any pets around you, be aware of where they are.  Review your medicines with your doctor. Some medicines can make you feel dizzy. This can increase your chance of falling. Ask your doctor what other things that you can do to help prevent falls. This information is not intended to replace advice given to you by your health care provider. Make sure you discuss any questions you have with your health care provider. Document Released: 05/22/2009 Document Revised: 01/01/2016 Document Reviewed: 08/30/2014 Elsevier Interactive Patient Education  2017 Justin Patel.

## 2020-06-20 ENCOUNTER — Other Ambulatory Visit: Payer: Self-pay | Admitting: Family Medicine

## 2020-06-20 ENCOUNTER — Other Ambulatory Visit: Payer: Self-pay | Admitting: Neurology

## 2020-06-20 DIAGNOSIS — I10 Essential (primary) hypertension: Secondary | ICD-10-CM

## 2020-06-20 DIAGNOSIS — R600 Localized edema: Secondary | ICD-10-CM

## 2020-07-01 DIAGNOSIS — J324 Chronic pansinusitis: Secondary | ICD-10-CM | POA: Diagnosis not present

## 2020-07-01 DIAGNOSIS — H6980 Other specified disorders of Eustachian tube, unspecified ear: Secondary | ICD-10-CM | POA: Diagnosis not present

## 2020-07-14 ENCOUNTER — Other Ambulatory Visit: Payer: Self-pay | Admitting: Cardiovascular Disease

## 2020-07-14 DIAGNOSIS — M5440 Lumbago with sciatica, unspecified side: Secondary | ICD-10-CM | POA: Diagnosis not present

## 2020-07-14 DIAGNOSIS — E119 Type 2 diabetes mellitus without complications: Secondary | ICD-10-CM

## 2020-07-14 DIAGNOSIS — M4126 Other idiopathic scoliosis, lumbar region: Secondary | ICD-10-CM | POA: Diagnosis not present

## 2020-07-14 DIAGNOSIS — S32010A Wedge compression fracture of first lumbar vertebra, initial encounter for closed fracture: Secondary | ICD-10-CM | POA: Diagnosis not present

## 2020-07-14 DIAGNOSIS — M48061 Spinal stenosis, lumbar region without neurogenic claudication: Secondary | ICD-10-CM | POA: Diagnosis not present

## 2020-07-15 DIAGNOSIS — Z980 Intestinal bypass and anastomosis status: Secondary | ICD-10-CM | POA: Diagnosis not present

## 2020-07-15 DIAGNOSIS — Z79899 Other long term (current) drug therapy: Secondary | ICD-10-CM | POA: Diagnosis not present

## 2020-07-15 DIAGNOSIS — R933 Abnormal findings on diagnostic imaging of other parts of digestive tract: Secondary | ICD-10-CM | POA: Diagnosis not present

## 2020-07-15 DIAGNOSIS — K838 Other specified diseases of biliary tract: Secondary | ICD-10-CM | POA: Diagnosis not present

## 2020-07-15 DIAGNOSIS — Z9884 Bariatric surgery status: Secondary | ICD-10-CM | POA: Diagnosis not present

## 2020-07-15 DIAGNOSIS — K3189 Other diseases of stomach and duodenum: Secondary | ICD-10-CM | POA: Diagnosis not present

## 2020-07-15 DIAGNOSIS — K2950 Unspecified chronic gastritis without bleeding: Secondary | ICD-10-CM | POA: Diagnosis not present

## 2020-07-15 DIAGNOSIS — R109 Unspecified abdominal pain: Secondary | ICD-10-CM | POA: Diagnosis not present

## 2020-07-15 DIAGNOSIS — Z791 Long term (current) use of non-steroidal anti-inflammatories (NSAID): Secondary | ICD-10-CM | POA: Diagnosis not present

## 2020-08-11 ENCOUNTER — Other Ambulatory Visit: Payer: Self-pay | Admitting: Neurology

## 2020-08-11 DIAGNOSIS — S32010A Wedge compression fracture of first lumbar vertebra, initial encounter for closed fracture: Secondary | ICD-10-CM | POA: Diagnosis not present

## 2020-08-11 DIAGNOSIS — M5416 Radiculopathy, lumbar region: Secondary | ICD-10-CM | POA: Diagnosis not present

## 2020-08-11 DIAGNOSIS — M48061 Spinal stenosis, lumbar region without neurogenic claudication: Secondary | ICD-10-CM | POA: Diagnosis not present

## 2020-08-11 DIAGNOSIS — M5440 Lumbago with sciatica, unspecified side: Secondary | ICD-10-CM | POA: Diagnosis not present

## 2020-08-18 ENCOUNTER — Ambulatory Visit: Payer: Medicare PPO | Admitting: Family Medicine

## 2020-08-27 ENCOUNTER — Other Ambulatory Visit: Payer: Self-pay | Admitting: Family Medicine

## 2020-09-05 ENCOUNTER — Ambulatory Visit: Payer: Medicare PPO | Admitting: Family Medicine

## 2020-09-08 ENCOUNTER — Ambulatory Visit: Payer: Medicare PPO | Admitting: Family Medicine

## 2020-09-10 DIAGNOSIS — S32010A Wedge compression fracture of first lumbar vertebra, initial encounter for closed fracture: Secondary | ICD-10-CM | POA: Diagnosis not present

## 2020-09-10 DIAGNOSIS — M810 Age-related osteoporosis without current pathological fracture: Secondary | ICD-10-CM | POA: Diagnosis not present

## 2020-09-10 DIAGNOSIS — M4126 Other idiopathic scoliosis, lumbar region: Secondary | ICD-10-CM | POA: Diagnosis not present

## 2020-09-10 DIAGNOSIS — M546 Pain in thoracic spine: Secondary | ICD-10-CM | POA: Diagnosis not present

## 2020-09-18 ENCOUNTER — Other Ambulatory Visit: Payer: Self-pay

## 2020-09-18 ENCOUNTER — Ambulatory Visit (INDEPENDENT_AMBULATORY_CARE_PROVIDER_SITE_OTHER): Payer: Medicare PPO | Admitting: Cardiovascular Disease

## 2020-09-18 ENCOUNTER — Encounter: Payer: Self-pay | Admitting: Cardiovascular Disease

## 2020-09-18 VITALS — BP 106/78 | HR 77 | Ht 67.0 in | Wt 181.0 lb

## 2020-09-18 DIAGNOSIS — I10 Essential (primary) hypertension: Secondary | ICD-10-CM

## 2020-09-18 DIAGNOSIS — E785 Hyperlipidemia, unspecified: Secondary | ICD-10-CM

## 2020-09-18 DIAGNOSIS — I5032 Chronic diastolic (congestive) heart failure: Secondary | ICD-10-CM | POA: Diagnosis not present

## 2020-09-18 NOTE — Progress Notes (Signed)
Cardiology Office Note   Date:  09/18/2020   ID:  Justin Patel, DOB June 10, 1951, MRN [id number]  PCP:  Justin Haven, MD  Cardiologist:   Justin Sacramento, MD   Chief Complaint  Patient presents with  . Annual Exam      History of Present Illness: Justin Patel is a 70 y.o. male who presents for a follow-up visit regarding chronic diastolic heart failure and presyncope versus seizure disorder.  He has known history of morbid obesity status post gastric bypass, iron deficiency anemia, diabetes mellitus, essential hypertension, hyperlipidemia, anxiety, prior alcohol abuse, degenerative disc disease with previous back surgery and GERD. Echocardiogram in June 2020 showed an EF of 55 to 60% with no significant valvular abnormalities.  He had issues with back problems last year with multiple surgeries.  In addition, he had cholecystectomy and he is going to follow-up at the [facility] cancer center due to suspicion of underlying cancer given dilated bile duct.  From a cardiac standpoint he has been stable with no chest pain, shortness of breath or palpitations.  Past Medical History:  Diagnosis Date  . 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 Anxiety   . Arthritis   . 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  removal scheduled to clear margins.  . CHF (congestive heart failure) ([place])    no longer after weight loss  . Chronic kidney disease    cysts  . Degenerative disc disease   . Diabetes mellitus    no longer diabetic-or on meds  . Family history of adverse reaction to anesthesia    sons wake up combative  . Gastric ulcer   . GERD (gastroesophageal reflux disease)   . H/O hiatal hernia   . Headache(784.0)    sinus  . Heart murmur   . Hx of congestive heart failure   . Hyperlipidemia   . Hypertension   . Iron deficiency anemia 02/28/2015  . Opiate abuse, continuous ([place]) 06/20/2015  . Sacral fracture, closed ([place])   .  Seizures ([place])    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, after GI bleed  . Sleep apnea    hx not now since wt loss  . Stones in the urinary tract   . Syncope and collapse     Past Surgical History:  Procedure Laterality Date  . ANTERIOR CERVICAL DECOMP/DISCECTOMY FUSION  01/26/2012   Procedure: ANTERIOR CERVICAL DECOMPRESSION/DISCECTOMY FUSION 3 LEVELS;  Surgeon: Floyce Stakes, MD;  Location: [facility];  Service: Neurosurgery;  Laterality: N/A;  Cervical three-four Cervical four-five Cervical five-six Cervical six-seven , Anterior cervical decompression/diskectomy, fusion, plate  . ANTERIOR LAT LUMBAR FUSION Right 04/25/2018   Procedure: Right Lumbar two-three Lumbar three-four Lumbar four-five anterior  lateral interbody fusion  with posterior percutaneous pedicle screws;  Surgeon: Erline Levine, MD;  Location: [place];  Service: Neurosurgery;  Laterality: Right;  . APPENDECTOMY    . [place], normal  . CARDIAC CATHETERIZATION     [place]  . COLONOSCOPY WITH PROPOFOL N/A 12/27/2017   Procedure: COLONOSCOPY WITH PROPOFOL;  Surgeon: Toledo, Benay Pike, MD;  Location: [facility];  Service: Gastroenterology;  Laterality: N/A;  . ESOPHAGOGASTRODUODENOSCOPY (EGD) WITH PROPOFOL N/A 12/27/2017   Procedure: ESOPHAGOGASTRODUODENOSCOPY (EGD) WITH PROPOFOL;  Surgeon: Toledo, Benay Pike, MD;  Location: [facility];  Service: Gastroenterology;  Laterality: N/A;  . GASTRIC BYPASS  2010   [place]  .  HERNIA REPAIR  2000   hiatal  . JOINT REPLACEMENT    . KNEE ARTHROSCOPY     bilateral, left x 2  . LAMINECTOMY WITH POSTERIOR LATERAL ARTHRODESIS LEVEL 4 N/A 01/14/2020   Procedure: Decompressive Laminectomy Lumbar One with pedicle screw fixation from Thoracic Ten to Lumbar Two;  Surgeon: Erline Levine, MD;  Location: [place];  Service: Neurosurgery;  Laterality: N/A;  Decompressive Laminectomy Lumbar One with pedicle screw fixation from Thoracic Ten to  Lumbar Two  . LUMBAR PERCUTANEOUS PEDICLE SCREW 3 LEVEL N/A 04/25/2018   Procedure: LUMBAR PERCUTANEOUS PEDICLE SCREW 3 LEVEL;  Surgeon: Erline Levine, MD;  Location: [place];  Service: Neurosurgery;  Laterality: N/A;  . NASAL SINUS SURGERY     x5  . OSTEOTOMY  2001   left  . TONSILLECTOMY    . TOTAL KNEE ARTHROPLASTY Left 05/2014   Dr. Marry Guan  . UVULOPALATOPLASTY  2011     Current Outpatient Medications  Medication Sig Dispense Refill  . amLODipine (NORVASC) 10 MG tablet TAKE ONE TABLET EVERY DAY 90 tablet 1  . blood glucose meter kit and supplies KIT Dispense based on patient and insurance preference. Check once daily. ICD doing E11.9. 1 each 0  . Calcium Carbonate-Vitamin D (CALCIUM 600/VITAMIN D PO) Take 1 tablet by mouth daily.    . cyclobenzaprine (FLEXERIL) 5 MG tablet Take 5-10 mg by mouth every 8 (eight) hours as needed.    . DULoxetine (CYMBALTA) 60 MG capsule TAKE 1 CAPSULE BY MOUTH EVERY DAY 90 capsule 1  . esomeprazole (NEXIUM) 40 MG capsule TAKE 1 CAPSULE BY MOUTH ONCE DAILY 90 capsule 1  . furosemide (LASIX) 20 MG tablet TAKE ONE TABLET BY MOUTH EVERY DAY 90 tablet 1  . HYDROcodone-acetaminophen (NORCO/VICODIN) 5-325 MG tablet Take 1 tablet by mouth every 6 (six) hours as needed for moderate pain. 15 tablet 0  . metoprolol succinate (TOPROL-XL) 50 MG 24 hr tablet TAKE 1 TABLET BY MOUTH DAILY WITH OR IMMEDIATLY FOLLOWING A MEAL 90 tablet 1  . Multiple Vitamin (MULTIVITAMIN) capsule Take 1 capsule by mouth daily. With copper and zinc    . nystatin cream (MYCOSTATIN) Apply 1 application topically 2 (two) times daily. 30 g 0  . nystatin-triamcinolone ointment (MYCOLOG) Apply 1 application topically 2 (two) times daily. 30 g 0  . primidone (MYSOLINE) 50 MG tablet TAKE 1/2 TABLET AT BEDTIME FOR 7 DAYS THEN INCREASE TO TAKE ONE TABLET AT BEDTIME 30 tablet 1  . rosuvastatin (CRESTOR) 40 MG tablet TAKE ONE TABLET EVERY DAY 90 tablet 0  . sucralfate (CARAFATE) 1 g tablet TAKE ONE  TABLET BY MOUTH FOUR TIMES DAILY 360 tablet 1  . tamsulosin (FLOMAX) 0.4 MG CAPS capsule TAKE 1 CAPSULE BY MOUTH EVERY DAY (Patient taking differently: Take 0.4 mg by mouth daily.) 90 capsule 3  . TRADJENTA 5 MG TABS tablet TAKE ONE TABLET BY MOUTH EVERY DAY 90 tablet 1  . traMADol (ULTRAM) 50 MG tablet Take 50 mg by mouth every 6 (six) hours as needed.     No current facility-administered medications for this visit.    Allergies:   Altace [ramipril], Levaquin [levofloxacin in d5w], Levofloxacin, Conray [iothalamate], Lyrica [pregabalin], Metformin, and Trulicity [dulaglutide]    Social History:  The patient  reports that he has never smoked. He has never used smokeless tobacco. He reports that he does not drink alcohol and does not use drugs.   Family History:  The patient's family history includes Alcoholism in his maternal uncle; Dementia (  age of onset: 64) in his mother; Diabetes in his father; Heart disease (age of onset: 44) in his father; Lung cancer in his paternal aunt, paternal aunt, paternal aunt, and paternal aunt; Lupus in his sister; Lymphoma in his sister; Mesothelioma in his cousin; Osteoporosis in his mother; Ovarian cancer (age of onset: 69) in his sister; Prostate cancer in his maternal uncle; Skin cancer in his maternal uncle; Tongue cancer in his maternal uncle.    ROS:  Please see the history of present illness.   Otherwise, review of systems are positive for none.   All other systems are reviewed and negative.    PHYSICAL EXAM: VS:  BP 106/78   Pulse 77   Ht 5' 7"  (1.702 m)   Wt 181 lb (82.1 kg)   BMI 28.35 kg/m  , BMI Body mass index is 28.35 kg/m. GEN: Well nourished, well developed, in no acute distress  HEENT: normal  Neck: no JVD, carotid bruits, or masses Cardiac: RRR; no  rubs, or gallops,no edema .  1/ 6 systolic murmur in the aortic area Respiratory:  clear to auscultation bilaterally, normal work of breathing GI: soft, nontender, nondistended, +  BS MS: no deformity or atrophy  Skin: warm and dry, no rash Neuro:  Strength and sensation are intact Psych: euthymic mood, full affect   EKG:  EKG is ordered today. The ekg ordered today demonstrates normal sinus rhythm with no significant ST or T wave changes.     Recent Labs: 11/06/2019: B Natriuretic Peptide 70.0 01/30/2020: BUN 9; Creatinine, Ser 0.85; Hemoglobin 10.0; Platelets 337; Potassium 3.5; Sodium 137 03/10/2020: ALT 20    Lipid Panel    Component Value Date/Time   CHOL 171 03/14/2019 0000   TRIG 150 (H) 03/14/2019 0000   HDL 64 03/14/2019 0000   CHOLHDL 2.7 03/14/2019 0000   CHOLHDL 3 03/25/2016 1108   VLDL 23.6 03/25/2016 1108   LDLCALC 77 03/14/2019 0000   LDLDIRECT 78.0 03/21/2013 1410      Wt Readings from Last 3 Encounters:  09/18/20 181 lb (82.1 kg)  06/19/20 173 lb (78.5 kg)  05/20/20 186 lb (84.4 kg)       No flowsheet data found.    ASSESSMENT AND PLAN:   1. Chronic diastolic heart failure: He is euvolemic on small dose furosemide 20 mg daily.  2.   Essential hypertension: Blood pressure is controlled on Toprol and amlodipine.  Blood pressures on the low side and we could consider decreasing amlodipine to 5 mg daily.  3. Hyperlipidemia: Currently on high-dose rosuvastatin with most recent LDL of 77.   Disposition: Follow-up in our clinic in 1 year.  Signed,  Justin Sacramento, MD  09/18/2020 2:52 PM    [facility]

## 2020-09-18 NOTE — Patient Instructions (Signed)
Medication Instructions:  Your physician recommends that you continue on your current medications as directed. Please refer to the Current Medication list given to you today.  *If you need a refill on your cardiac medications before your next appointment, please call your pharmacy*   Lab Work: None ordeerd If you have labs (blood work) drawn today and your tests are completely normal, you will receive your results only by: Marland Kitchen MyChart Message (if you have MyChart) OR . A paper copy in the mail If you have any lab test that is abnormal or we need to change your treatment, we will call you to review the results.   Testing/Procedures: None ordered  Follow-Up: At [facility], you and your health needs are our priority.  As part of our continuing mission to provide you with exceptional heart care, we have created designated Provider Care Teams.  These Care Teams include your primary Cardiologist (physician) and Advanced Practice Providers (APPs -  Physician Assistants and Nurse Practitioners) who all work together to provide you with the care you need, when you need it.  We recommend signing up for the patient portal called "MyChart".  Sign up information is provided on this After Visit Summary.  MyChart is used to connect with patients for Virtual Visits (Telemedicine).  Patients are able to view lab/test results, encounter notes, upcoming appointments, etc.  Non-urgent messages can be sent to your provider as well.   To learn more about what you can do with MyChart, go to [email or web address].    Your next appointment:   Your physician wants you to follow-up in: 1 year You will receive a reminder letter in the mail two months in advance. If you don't receive a letter, please call our office to schedule the follow-up appointment.    The format for your next appointment:   In Person  Provider:   You may see Kathlyn Sacramento, MD or one of the following Advanced Practice Providers on your  designated Care Team:    Murray Hodgkins, NP  Christell Faith, PA-C  Marrianne Mood, PA-C  Cadence Luke, [place]  Laurann Montana, NP    Other Instructions N/A

## 2020-09-24 ENCOUNTER — Other Ambulatory Visit: Payer: Self-pay

## 2020-09-25 ENCOUNTER — Ambulatory Visit: Payer: Medicare PPO | Admitting: Family Medicine

## 2020-09-25 ENCOUNTER — Encounter: Payer: Self-pay | Admitting: Family Medicine

## 2020-09-25 ENCOUNTER — Other Ambulatory Visit: Payer: Self-pay

## 2020-09-25 VITALS — BP 130/70 | HR 84 | Temp 98.4°F | Ht 67.0 in | Wt 179.4 lb

## 2020-09-25 DIAGNOSIS — G8929 Other chronic pain: Secondary | ICD-10-CM | POA: Insufficient documentation

## 2020-09-25 DIAGNOSIS — M545 Low back pain, unspecified: Secondary | ICD-10-CM

## 2020-09-25 DIAGNOSIS — J0110 Acute frontal sinusitis, unspecified: Secondary | ICD-10-CM | POA: Diagnosis not present

## 2020-09-25 DIAGNOSIS — E119 Type 2 diabetes mellitus without complications: Secondary | ICD-10-CM

## 2020-09-25 DIAGNOSIS — M5416 Radiculopathy, lumbar region: Secondary | ICD-10-CM | POA: Insufficient documentation

## 2020-09-25 DIAGNOSIS — J329 Chronic sinusitis, unspecified: Secondary | ICD-10-CM | POA: Insufficient documentation

## 2020-09-25 DIAGNOSIS — K838 Other specified diseases of biliary tract: Secondary | ICD-10-CM

## 2020-09-25 DIAGNOSIS — M810 Age-related osteoporosis without current pathological fracture: Secondary | ICD-10-CM

## 2020-09-25 LAB — POCT GLYCOSYLATED HEMOGLOBIN (HGB A1C): Hemoglobin A1C: 6.7 % — AB (ref 4.0–5.6)

## 2020-09-25 MED ORDER — AMOXICILLIN-POT CLAVULANATE 875-125 MG PO TABS: 1.0000 | ORAL_TABLET | Freq: Two times a day (BID) | ORAL | 0 refills | Status: DC

## 2020-09-25 NOTE — Assessment & Plan Note (Signed)
>>  ASSESSMENT AND PLAN FOR CHRONIC BACK PAIN WRITTEN ON 09/25/2020  1:23 PM BY SONNENBERG, ERIC G, MD  He will continue to see his neurosurgeon and his pain specialist.

## 2020-09-25 NOTE — Progress Notes (Signed)
[name], MD Phone: [phone]  Justin Patel is a 70 y.o. male who presents today for follow-up.  Chronic back pain: This is an ongoing issue.  He follows with neurosurgery.  Has had several spine surgeries.  He saw neurosurgery earlier this month and they had an x-ray that revealed one of the screws is not holding its place.  They believe that is causing his pain.  He is supposed to see their pain specialist and then follow-up with neurosurgery afterwards.  Osteoporosis: He follows with rheumatology.  He does take Reclast once yearly.  Common bile duct dilation: Patient underwent work-up through GI at [facility].  He had a small intestine endoscopy which revealed chronic gastritis with negative H. pylori.  There was no mass seen at the ampulla of Vater.  They plan to follow-up in March and have another scan.  Diabetes: Taking Tradjenta once daily.  Glucose has been 75-90.  Does note some polyuria.  No polydipsia.  No hypoglycemia.  His weight has been trending down some with laying off sweets and cookies.  Sinusitis: Patient notes sinus drainage and congestion for the past 3 weeks.  He notes he is getting very little out of his nose.  He gets some brown mucus out of his throat.  He reports a negative home COVID test about a week ago.  Notes his symptoms are consistent with his prior sinus infections.  Social History   Tobacco Use  Smoking Status Never Smoker  Smokeless Tobacco Never Used    Current Outpatient Medications on File Prior to Visit  Medication Sig Dispense Refill  . amLODipine (NORVASC) 10 MG tablet TAKE ONE TABLET EVERY DAY 90 tablet 1  . blood glucose meter kit and supplies KIT Dispense based on patient and insurance preference. Check once daily. ICD doing E11.9. 1 each 0  . Calcium Carbonate-Vitamin D (CALCIUM 600/VITAMIN D PO) Take 1 tablet by mouth daily.    . DULoxetine (CYMBALTA) 60 MG capsule TAKE 1 CAPSULE BY MOUTH EVERY DAY 90 capsule 1  . esomeprazole  (NEXIUM) 40 MG capsule TAKE 1 CAPSULE BY MOUTH ONCE DAILY 90 capsule 1  . furosemide (LASIX) 20 MG tablet TAKE ONE TABLET BY MOUTH EVERY DAY 90 tablet 1  . HYDROcodone-acetaminophen (NORCO) 7.5-325 MG tablet Take 1 tablet by mouth every 6 (six) hours as needed.    . metoprolol succinate (TOPROL-XL) 50 MG 24 hr tablet TAKE 1 TABLET BY MOUTH DAILY WITH OR IMMEDIATLY FOLLOWING A MEAL 90 tablet 1  . Multiple Vitamin (MULTIVITAMIN) capsule Take 1 capsule by mouth daily. With copper and zinc    . nystatin cream (MYCOSTATIN) Apply 1 application topically 2 (two) times daily. 30 g 0  . nystatin-triamcinolone ointment (MYCOLOG) Apply 1 application topically 2 (two) times daily. 30 g 0  . primidone (MYSOLINE) 50 MG tablet TAKE 1/2 TABLET AT BEDTIME FOR 7 DAYS THEN INCREASE TO TAKE ONE TABLET AT BEDTIME 30 tablet 1  . rosuvastatin (CRESTOR) 40 MG tablet TAKE ONE TABLET EVERY DAY 90 tablet 0  . sucralfate (CARAFATE) 1 g tablet TAKE ONE TABLET BY MOUTH FOUR TIMES DAILY 360 tablet 1  . tamsulosin (FLOMAX) 0.4 MG CAPS capsule TAKE 1 CAPSULE BY MOUTH EVERY DAY (Patient taking differently: Take 0.4 mg by mouth daily.) 90 capsule 3  . TRADJENTA 5 MG TABS tablet TAKE ONE TABLET BY MOUTH EVERY DAY 90 tablet 1  . cyclobenzaprine (FLEXERIL) 5 MG tablet Take 5-10 mg by mouth every 8 (eight) hours as needed. (Patient  not taking: Reported on 09/25/2020)     No current facility-administered medications on file prior to visit.     ROS see history of present illness  Objective  Physical Exam Vitals:   09/25/20 1117  BP: 130/70  Pulse: 84  Temp: 98.4 F (36.9 C)  SpO2: 96%    BP Readings from Last 3 Encounters:  09/25/20 130/70  09/18/20 106/78  05/20/20 120/80   Wt Readings from Last 3 Encounters:  09/25/20 179 lb 6.4 oz (81.4 kg)  09/18/20 181 lb (82.1 kg)  06/19/20 173 lb (78.5 kg)    Physical Exam Constitutional:      General: He is not in acute distress.    Appearance: He is not diaphoretic.   Cardiovascular:     Rate and Rhythm: Normal rate and regular rhythm.     Heart sounds: Normal heart sounds.  Pulmonary:     Effort: Pulmonary effort is normal.     Breath sounds: Normal breath sounds.  Musculoskeletal:        General: No edema.     Right lower leg: No edema.     Left lower leg: No edema.  Skin:    General: Skin is warm and dry.  Neurological:     Mental Status: He is alert.      Assessment/Plan: Please see individual problem list.  Problem List Items Addressed This Visit    Chronic back pain    He will continue to see his neurosurgeon and his pain specialist.      Relevant Medications   HYDROcodone-acetaminophen (NORCO) 7.5-325 MG tablet   Common bile duct dilatation    The patient has completed work-up for this.  He'll continue to see GI at [facility].      Diabetes ([place]) - Primary    Check A1c.  Continue Tradjenta 5 mg once daily.      Relevant Orders   POCT HgB A1C (Completed)   Osteoporosis without current pathological fracture    He will continue to see his specialist for his Reclast infusions.      Sinusitis    Symptoms are likely related to sinusitis.  Given duration there is no point testing for Covid again.  Discussed that he would not have passed the screening questionnaire to come into the office though he does report he reported the Covid test and his symptoms to the staff when they called to screen him.  We will proceed with Augmentin 1 tablet twice daily for 1 week.  If not improving he'll let us know.  In the future if he has similar symptoms he needs to let us know so we can do the visit virtually.      Relevant Medications   amoxicillin-clavulanate (AUGMENTIN) 875-125 MG tablet       This visit occurred during the SARS-CoV-2 public health emergency.  Safety protocols were in place, including screening questions prior to the visit, additional usage of staff PPE, and extensive cleaning of exam room while observing appropriate contact time  as indicated for disinfecting solutions.    [name], MD [place]

## 2020-09-25 NOTE — Assessment & Plan Note (Signed)
He will continue to see his specialist for his Reclast infusions.

## 2020-09-25 NOTE — Assessment & Plan Note (Signed)
Check A1c.  Continue Tradjenta 5 mg once daily. °

## 2020-09-25 NOTE — Patient Instructions (Signed)
Nice to see you. We will contact you with your A1c result. Please let us know if the antibiotic is not beneficial. Please continue to see your specialist at [facility] and with the neurosurgeon.

## 2020-09-25 NOTE — Assessment & Plan Note (Signed)
The patient has completed work-up for this.  He'll continue to see GI at [facility].

## 2020-09-25 NOTE — Assessment & Plan Note (Signed)
He will continue to see his neurosurgeon and his pain specialist.

## 2020-09-25 NOTE — Assessment & Plan Note (Signed)
Symptoms are likely related to sinusitis.  Given duration there is no point testing for Covid again.  Discussed that he would not have passed the screening questionnaire to come into the office though he does report he reported the Covid test and his symptoms to the staff when they called to screen him.  We will proceed with Augmentin 1 tablet twice daily for 1 week.  If not improving he'll let us know.  In the future if he has similar symptoms he needs to let us know so we can do the visit virtually.

## 2020-10-08 DIAGNOSIS — M4322 Fusion of spine, cervical region: Secondary | ICD-10-CM | POA: Diagnosis not present

## 2020-10-08 DIAGNOSIS — M7918 Myalgia, other site: Secondary | ICD-10-CM | POA: Diagnosis not present

## 2020-10-08 DIAGNOSIS — G894 Chronic pain syndrome: Secondary | ICD-10-CM | POA: Diagnosis not present

## 2020-10-08 DIAGNOSIS — M8580 Other specified disorders of bone density and structure, unspecified site: Secondary | ICD-10-CM | POA: Diagnosis not present

## 2020-10-10 ENCOUNTER — Other Ambulatory Visit: Payer: Self-pay | Admitting: Cardiovascular Disease

## 2020-10-10 DIAGNOSIS — E119 Type 2 diabetes mellitus without complications: Secondary | ICD-10-CM

## 2020-10-14 ENCOUNTER — Other Ambulatory Visit: Payer: Self-pay | Admitting: Neurology

## 2020-10-14 DIAGNOSIS — Z6828 Body mass index (BMI) 28.0-28.9, adult: Secondary | ICD-10-CM | POA: Diagnosis not present

## 2020-10-14 DIAGNOSIS — M5441 Lumbago with sciatica, right side: Secondary | ICD-10-CM | POA: Diagnosis not present

## 2020-10-14 DIAGNOSIS — I10 Essential (primary) hypertension: Secondary | ICD-10-CM | POA: Diagnosis not present
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
## 2020-10-16 ENCOUNTER — Encounter: Payer: Self-pay | Admitting: Neurology

## 2020-10-16 ENCOUNTER — Ambulatory Visit: Payer: Medicare PPO | Admitting: Neurology

## 2020-10-16 ENCOUNTER — Other Ambulatory Visit: Payer: Self-pay

## 2020-10-16 VITALS — BP 173/103 | HR 87 | Ht 67.0 in | Wt 178.0 lb

## 2020-10-16 DIAGNOSIS — G250 Essential tremor: Secondary | ICD-10-CM

## 2020-10-16 MED ORDER — PRIMIDONE 50 MG PO TABS: 50.0000 mg | ORAL_TABLET | Freq: Every day | ORAL | 5 refills | Status: DC

## 2020-10-24 DIAGNOSIS — R933 Abnormal findings on diagnostic imaging of other parts of digestive tract: Secondary | ICD-10-CM | POA: Diagnosis not present

## 2020-10-24 DIAGNOSIS — K8689 Other specified diseases of pancreas: Secondary | ICD-10-CM | POA: Diagnosis not present

## 2020-10-24 DIAGNOSIS — K838 Other specified diseases of biliary tract: Secondary | ICD-10-CM | POA: Diagnosis not present

## 2020-10-27 DIAGNOSIS — R933 Abnormal findings on diagnostic imaging of other parts of digestive tract: Secondary | ICD-10-CM | POA: Diagnosis not present

## 2020-11-18 ENCOUNTER — Encounter: Payer: Self-pay | Admitting: Family Medicine

## 2020-11-18 ENCOUNTER — Other Ambulatory Visit: Payer: Self-pay

## 2020-11-18 ENCOUNTER — Ambulatory Visit: Payer: Medicare PPO | Admitting: Family Medicine

## 2020-11-18 VITALS — BP 140/80 | HR 100 | Temp 99.2°F | Ht 67.0 in | Wt 176.2 lb

## 2020-11-18 DIAGNOSIS — I10 Essential (primary) hypertension: Secondary | ICD-10-CM | POA: Diagnosis not present

## 2020-11-18 DIAGNOSIS — K838 Other specified diseases of biliary tract: Secondary | ICD-10-CM

## 2020-11-18 DIAGNOSIS — S32010D Wedge compression fracture of first lumbar vertebra, subsequent encounter for fracture with routine healing: Secondary | ICD-10-CM | POA: Diagnosis not present

## 2020-11-18 DIAGNOSIS — F320 Major depressive disorder, single episode, mild: Secondary | ICD-10-CM | POA: Diagnosis not present

## 2020-11-18 DIAGNOSIS — M810 Age-related osteoporosis without current pathological fracture: Secondary | ICD-10-CM

## 2020-11-18 MED ORDER — VENLAFAXINE HCL ER 37.5 MG PO CP24: ORAL_CAPSULE | ORAL | 1 refills | Status: DC

## 2020-11-18 NOTE — Assessment & Plan Note (Signed)
Patient is having significant discomfort related to his fusion.  He sees his neurosurgeon tomorrow and I encouraged him to keep that appointment.  They will be the ones to decide what the next step would be and will help determine what appropriate pain management will be.

## 2020-11-18 NOTE — Patient Instructions (Signed)
Nice to see you. Please see Dr. Vertell Limber as planned. We will switch you from Cymbalta to Effexor.  You can discontinue the Cymbalta and switch straight to the Effexor the next day.  If you notice any side effects or if your blood pressure trends up with this please let us know.

## 2020-11-18 NOTE — Assessment & Plan Note (Signed)
The patient has had a reassuring work-up through GI.  He will continue to see them once yearly.

## 2020-11-18 NOTE — Assessment & Plan Note (Signed)
Well-controlled at home.  He will continue amlodipine 10 mg once daily, Lasix 20 mg daily, and metoprolol 50 mg daily.

## 2020-11-18 NOTE — Assessment & Plan Note (Signed)
He will continue to see endocrinology for his request.

## 2020-11-18 NOTE — Assessment & Plan Note (Signed)
>>  ASSESSMENT AND PLAN FOR DEPRESSION, MAJOR, SINGLE EPISODE, MILD (HCC) WRITTEN ON 11/18/2020 10:29 AM BY SONNENBERG, ERIC G, MD  The patient continues to have depression.  We are going to transition him from Cymbalta  to Effexor .  Discussed that he needs to monitor his blood pressure with this change.  I will see him back in 4 to 6 weeks for follow-up.  If he notices any side effects to let us  know.

## 2020-11-18 NOTE — Assessment & Plan Note (Signed)
The patient continues to have depression.  We are going to transition him from Cymbalta to Effexor.  Discussed that he needs to monitor his blood pressure with this change.  I will see him back in 4 to 6 weeks for follow-up.  If he notices any side effects to let us know.

## 2020-11-18 NOTE — Progress Notes (Signed)
Justin Rumps, MD Phone: [phone]  Justin Patel is a 70 y.o. male who presents today for f/u.  HYPERTENSION  Disease Monitoring  Home BP Monitoring 120s/70s Chest pain- no    Dyspnea- no Medications  Compliance-  Taking amlodipine, lasix, metoprolol.  Depression: Patient notes this has gotten worse.  A lot of it has to do with his chronic pain issues.  He notes his son is also getting a divorce after being married for about 14 years.  He feels as though the Cymbalta has not been beneficial.  He denies SI.  Bile duct abnormality: Patient has followed up with GI at [facility].  He had an MRCP recently that was reassuring.  He still has some digestion issues with looser stools though does use Imodium as needed.  GI advised he could follow-up in 1 year with repeat MRCP.  Chronic back pain: He sees Dr. Vertell Limber tomorrow for follow-up.  Has had fusion of T10-L2.  He notes quite a bit of pain around T10 and reports that the screws are backing out.  He has trouble resting and sleeping.  The Norco was not helping at all so he stopped taking it.  He takes 2 extra strength Tylenol twice daily and that does help some.  He saw pain management and had some trigger point injections.  He has osteoporosis which complicates the matter.  He is due for his Reclast soon.    Social History   Tobacco Use  Smoking Status Never Smoker  Smokeless Tobacco Never Used    Current Outpatient Medications on File Prior to Visit  Medication Sig Dispense Refill  . amLODipine (NORVASC) 10 MG tablet TAKE ONE TABLET EVERY DAY 90 tablet 1  . blood glucose meter kit and supplies KIT Dispense based on patient and insurance preference. Check once daily. ICD doing E11.9. 1 each 0  . Calcium Carbonate-Vitamin D (CALCIUM 600/VITAMIN D PO) Take 1 tablet by mouth daily.    . cyclobenzaprine (FLEXERIL) 5 MG tablet Take 5-10 mg by mouth every 8 (eight) hours as needed.    Marland Kitchen esomeprazole (NEXIUM) 40 MG capsule TAKE 1 CAPSULE BY  MOUTH ONCE DAILY 90 capsule 1  . furosemide (LASIX) 20 MG tablet TAKE ONE TABLET BY MOUTH EVERY DAY 90 tablet 1  . HYDROcodone-acetaminophen (NORCO) 7.5-325 MG tablet Take 1 tablet by mouth every 6 (six) hours as needed.    . metoprolol succinate (TOPROL-XL) 50 MG 24 hr tablet TAKE 1 TABLET BY MOUTH DAILY WITH OR IMMEDIATLY FOLLOWING A MEAL 90 tablet 1  . Multiple Vitamin (MULTIVITAMIN) capsule Take 1 capsule by mouth daily. With copper and zinc    . nystatin cream (MYCOSTATIN) Apply 1 application topically 2 (two) times daily. 30 g 0  . nystatin-triamcinolone ointment (MYCOLOG) Apply 1 application topically 2 (two) times daily. 30 g 0  . primidone (MYSOLINE) 50 MG tablet Take 1 tablet (50 mg total) by mouth at bedtime. 30 tablet 5  . rosuvastatin (CRESTOR) 40 MG tablet TAKE ONE TABLET EVERY DAY 90 tablet 3  . sucralfate (CARAFATE) 1 g tablet TAKE ONE TABLET BY MOUTH FOUR TIMES DAILY 360 tablet 1  . tamsulosin (FLOMAX) 0.4 MG CAPS capsule TAKE 1 CAPSULE BY MOUTH EVERY DAY (Patient taking differently: Take 0.4 mg by mouth daily.) 90 capsule 3  . TRADJENTA 5 MG TABS tablet TAKE ONE TABLET BY MOUTH EVERY DAY 90 tablet 1   No current facility-administered medications on file prior to visit.     ROS see history  of present illness  Objective  Physical Exam Vitals:   11/18/20 1006  BP: 140/80  Pulse: 100  Temp: 99.2 F (37.3 C)  SpO2: 98%    BP Readings from Last 3 Encounters:  11/18/20 140/80  10/16/20 (!) 173/103  09/25/20 130/70   Wt Readings from Last 3 Encounters:  11/18/20 176 lb 3.2 oz (79.9 kg)  10/16/20 178 lb (80.7 kg)  09/25/20 179 lb 6.4 oz (81.4 kg)    Physical Exam Constitutional:      General: He is not in acute distress.    Appearance: He is not diaphoretic.  Cardiovascular:     Rate and Rhythm: Normal rate and regular rhythm.     Heart sounds: Normal heart sounds.  Pulmonary:     Effort: Pulmonary effort is normal.     Breath sounds: Normal breath sounds.   Musculoskeletal:     Right lower leg: No edema.     Left lower leg: No edema.     Comments: No significant tenderness of the paraspinous.  Hematomas thoracic spine  Skin:    General: Skin is warm and dry.  Neurological:     Mental Status: He is alert.      Assessment/Plan: Please see individual problem list.  Problem List Items Addressed This Visit    Essential hypertension, benign    Well-controlled at home.  He will continue amlodipine 10 mg once daily, Lasix 20 mg daily, and metoprolol 50 mg daily.      Osteoporosis without current pathological fracture    He will continue to see endocrinology for his request.      Depression, major, single episode, mild ([place])    The patient continues to have depression.  We are going to transition him from Cymbalta to Effexor.  Discussed that he needs to monitor his blood pressure with this change.  I will see him back in 4 to 6 weeks for follow-up.  If he notices any side effects to let us know.      Relevant Medications   venlafaxine XR (EFFEXOR XR) 37.5 MG 24 hr capsule   Fracture of L1 vertebra (HCC) - Primary    Patient is having significant discomfort related to his fusion.  He sees his neurosurgeon tomorrow and I encouraged him to keep that appointment.  They will be the ones to decide what the next step would be and will help determine what appropriate pain management will be.      Common bile duct dilatation    The patient has had a reassuring work-up through GI.  He will continue to see them once yearly.         This visit occurred during the SARS-CoV-2 public health emergency.  Safety protocols were in place, including screening questions prior to the visit, additional usage of staff PPE, and extensive cleaning of exam room while observing appropriate contact time as indicated for disinfecting solutions.    Justin Rumps, MD [place]

## 2020-11-19 DIAGNOSIS — I10 Essential (primary) hypertension: Secondary | ICD-10-CM | POA: Diagnosis not present

## 2020-11-19 DIAGNOSIS — Z6828 Body mass index (BMI) 28.0-28.9, adult: Secondary | ICD-10-CM | POA: Diagnosis not present

## 2020-11-19 DIAGNOSIS — M4126 Other idiopathic scoliosis, lumbar region: Secondary | ICD-10-CM | POA: Diagnosis not present

## 2020-11-19 DIAGNOSIS — M546 Pain in thoracic spine: Secondary | ICD-10-CM | POA: Diagnosis not present

## 2020-12-01 ENCOUNTER — Other Ambulatory Visit: Payer: Self-pay | Admitting: Family Medicine

## 2020-12-02 DIAGNOSIS — G894 Chronic pain syndrome: Secondary | ICD-10-CM | POA: Diagnosis not present

## 2020-12-02 DIAGNOSIS — M47814 Spondylosis without myelopathy or radiculopathy, thoracic region: Secondary | ICD-10-CM | POA: Diagnosis not present

## 2020-12-02 DIAGNOSIS — M546 Pain in thoracic spine: Secondary | ICD-10-CM | POA: Diagnosis not present

## 2020-12-18 ENCOUNTER — Other Ambulatory Visit: Payer: Self-pay | Admitting: Family Medicine

## 2020-12-18 DIAGNOSIS — R600 Localized edema: Secondary | ICD-10-CM

## 2021-01-02 ENCOUNTER — Other Ambulatory Visit: Payer: Self-pay

## 2021-01-02 ENCOUNTER — Ambulatory Visit: Payer: Medicare PPO | Admitting: Family Medicine

## 2021-01-02 ENCOUNTER — Encounter: Payer: Self-pay | Admitting: Family Medicine

## 2021-01-02 VITALS — BP 110/64 | HR 67 | Temp 97.6°F | Ht 67.0 in | Wt 182.2 lb

## 2021-01-02 DIAGNOSIS — G8929 Other chronic pain: Secondary | ICD-10-CM | POA: Diagnosis not present

## 2021-01-02 DIAGNOSIS — F320 Major depressive disorder, single episode, mild: Secondary | ICD-10-CM | POA: Diagnosis not present

## 2021-01-02 DIAGNOSIS — E119 Type 2 diabetes mellitus without complications: Secondary | ICD-10-CM

## 2021-01-02 DIAGNOSIS — M545 Low back pain, unspecified: Secondary | ICD-10-CM

## 2021-01-02 NOTE — Assessment & Plan Note (Signed)
This is an ongoing issue.  He will continue to see his pain specialist and neurosurgeon.  Discussed the potential for increasing his Effexor though he wants to wait until he is finished his current supply.  He will contact us 1 to 2 weeks before he runs out of the 75 mg dose of Effexor.

## 2021-01-02 NOTE — Assessment & Plan Note (Signed)
>>  ASSESSMENT AND PLAN FOR CHRONIC BACK PAIN WRITTEN ON 01/02/2021  1:41 PM BY SONNENBERG, ERIC G, MD  This is an ongoing issue.  He will continue to see his pain specialist and neurosurgeon.  Discussed the potential for increasing his Effexor  though he wants to wait until he is finished his current supply.  He will contact us  1 to 2 weeks before he runs out of the 75 mg dose of Effexor .

## 2021-01-02 NOTE — Patient Instructions (Signed)
Nice to see you. Please contact us 1 to 2 weeks before you run out of your Effexor. We will let you know what your urine result shows.

## 2021-01-02 NOTE — Assessment & Plan Note (Signed)
Quite a bit improved. Discussed the potential for increasing his Effexor though he wants to wait until he is finished his current supply.  He will contact us 1 to 2 weeks before he runs out of the 75 mg dose of Effexor and we will consider increasing the dose at that time.

## 2021-01-02 NOTE — Assessment & Plan Note (Addendum)
Well-controlled.  He will continue Tradjenta 5 mg once daily. He defers checking a urine microalbumin/creatinine ratio to his next visit.

## 2021-01-02 NOTE — Assessment & Plan Note (Signed)
>>  ASSESSMENT AND PLAN FOR DEPRESSION, MAJOR, SINGLE EPISODE, MILD (HCC) WRITTEN ON 01/02/2021  1:41 PM BY SONNENBERG, ERIC G, MD  Quite a bit improved. Discussed the potential for increasing his Effexor  though he wants to wait until he is finished his current supply.  He will contact us  1 to 2 weeks before he runs out of the 75 mg dose of Effexor  and we will consider increasing the dose at that time.

## 2021-01-02 NOTE — Progress Notes (Signed)
[name], MD Phone: [phone]  Justin Patel is a 70 y.o. male who presents today for follow-up.  Depression/anxiety: Patient notes his depression and anxiety are much improved though he does still have some symptoms.  He has been on the 75 mg dose of Effexor for a few weeks.  He is also getting counseling through his church.  Denies SI.  Chronic pain: Patient notes the Effexor has helped some with his pain.  He is no longer on Norco though is now on tramadol 100 mg 4 times a day.  He is seeing pain management through his neurosurgeons office and they are going to be more aggressive about his spinal injections.  He notes that they discussed a spinal cord stimulator if the injections do not help.  Diabetes: Typically ranging 90-120.  Taking Tradjenta.  No polyuria or polydipsia.  No hypoglycemia.  Social History   Tobacco Use  Smoking Status Never Smoker  Smokeless Tobacco Never Used    Current Outpatient Medications on File Prior to Visit  Medication Sig Dispense Refill  . amLODipine (NORVASC) 10 MG tablet TAKE ONE TABLET EVERY DAY 90 tablet 1  . blood glucose meter kit and supplies KIT Dispense based on patient and insurance preference. Check once daily. ICD doing E11.9. 1 each 0  . Calcium Carbonate-Vitamin D (CALCIUM 600/VITAMIN D PO) Take 1 tablet by mouth daily.    . cyclobenzaprine (FLEXERIL) 5 MG tablet Take 5-10 mg by mouth every 8 (eight) hours as needed.    Marland Kitchen esomeprazole (NEXIUM) 40 MG capsule TAKE 1 CAPSULE BY MOUTH ONCE DAILY 90 capsule 1  . furosemide (LASIX) 20 MG tablet TAKE ONE TABLET BY MOUTH EVERY DAY 90 tablet 1  . metoprolol succinate (TOPROL-XL) 50 MG 24 hr tablet TAKE 1 TABLET BY MOUTH DAILY WITH OR IMMEDIATLY FOLLOWING A MEAL 90 tablet 1  . Multiple Vitamin (MULTIVITAMIN) capsule Take 1 capsule by mouth daily. With copper and zinc    . nystatin cream (MYCOSTATIN) Apply 1 application topically 2 (two) times daily. 30 g 0  . nystatin-triamcinolone  ointment (MYCOLOG) Apply 1 application topically 2 (two) times daily. 30 g 0  . primidone (MYSOLINE) 50 MG tablet Take 1 tablet (50 mg total) by mouth at bedtime. 30 tablet 5  . rosuvastatin (CRESTOR) 40 MG tablet TAKE ONE TABLET EVERY DAY 90 tablet 3  . sucralfate (CARAFATE) 1 g tablet TAKE ONE TABLET BY MOUTH FOUR TIMES DAILY 360 tablet 1  . tamsulosin (FLOMAX) 0.4 MG CAPS capsule TAKE 1 CAPSULE BY MOUTH EVERY DAY 90 capsule 3  . TRADJENTA 5 MG TABS tablet TAKE 1 TABLET BY MOUTH DAILY 90 tablet 1  . traMADol (ULTRAM) 50 MG tablet Take 50 mg by mouth every 6 (six) hours as needed.    . venlafaxine XR (EFFEXOR-XR) 75 MG 24 hr capsule Take 75 mg by mouth every morning.     No current facility-administered medications on file prior to visit.     ROS see history of present illness  Objective  Physical Exam Vitals:   01/02/21 1320  BP: 110/64  Pulse: 67  Temp: 97.6 F (36.4 C)  SpO2: 97%    BP Readings from Last 3 Encounters:  01/02/21 110/64  11/18/20 140/80  10/16/20 (!) 173/103   Wt Readings from Last 3 Encounters:  01/02/21 182 lb 3.2 oz (82.6 kg)  11/18/20 176 lb 3.2 oz (79.9 kg)  10/16/20 178 lb (80.7 kg)    Physical Exam Constitutional:  General: He is not in acute distress.    Appearance: He is not diaphoretic.  Cardiovascular:     Rate and Rhythm: Normal rate and regular rhythm.     Heart sounds: Normal heart sounds.  Pulmonary:     Effort: Pulmonary effort is normal.     Breath sounds: Normal breath sounds.  Skin:    General: Skin is warm and dry.  Neurological:     Mental Status: He is alert.    Diabetic Foot Exam - Simple   Simple Foot Form Diabetic Foot exam was performed with the following findings: Yes 01/02/2021  1:38 PM  Visual Inspection No deformities, no ulcerations, no other skin breakdown bilaterally: Yes Sensation Testing Intact to touch and monofilament testing bilaterally: Yes Pulse Check Posterior Tibialis and Dorsalis pulse  intact bilaterally: Yes Comments       Assessment/Plan: Please see individual problem list.  Problem List Items Addressed This Visit    Diabetes ([place]) - Primary    Well-controlled.  He will continue Tradjenta 5 mg once daily. He defers checking a urine microalbumin/creatinine ratio to his next visit.       Depression, major, single episode, mild ([place])    Quite a bit improved. Discussed the potential for increasing his Effexor though he wants to wait until he is finished his current supply.  He will contact us 1 to 2 weeks before he runs out of the 75 mg dose of Effexor and we will consider increasing the dose at that time.      Relevant Medications   venlafaxine XR (EFFEXOR-XR) 75 MG 24 hr capsule   Chronic back pain    This is an ongoing issue.  He will continue to see his pain specialist and neurosurgeon.  Discussed the potential for increasing his Effexor though he wants to wait until he is finished his current supply.  He will contact us 1 to 2 weeks before he runs out of the 75 mg dose of Effexor.      Relevant Medications   traMADol (ULTRAM) 50 MG tablet   venlafaxine XR (EFFEXOR-XR) 75 MG 24 hr capsule      Return in about 3 months (around 04/04/2021).  This visit occurred during the SARS-CoV-2 public health emergency.  Safety protocols were in place, including screening questions prior to the visit, additional usage of staff PPE, and extensive cleaning of exam room while observing appropriate contact time as indicated for disinfecting solutions.    [name], MD [place]

## 2021-01-22 DIAGNOSIS — M47814 Spondylosis without myelopathy or radiculopathy, thoracic region: Secondary | ICD-10-CM | POA: Diagnosis not present

## 2021-01-26 DIAGNOSIS — H6983 Other specified disorders of Eustachian tube, bilateral: Secondary | ICD-10-CM | POA: Diagnosis not present

## 2021-01-26 DIAGNOSIS — J324 Chronic pansinusitis: Secondary | ICD-10-CM | POA: Diagnosis not present

## 2021-02-04 DIAGNOSIS — M810 Age-related osteoporosis without current pathological fracture: Secondary | ICD-10-CM | POA: Diagnosis not present

## 2021-02-04 DIAGNOSIS — E559 Vitamin D deficiency, unspecified: Secondary | ICD-10-CM | POA: Diagnosis not present

## 2021-02-17 DIAGNOSIS — M47814 Spondylosis without myelopathy or radiculopathy, thoracic region: Secondary | ICD-10-CM | POA: Diagnosis not present

## 2021-02-17 DIAGNOSIS — M7918 Myalgia, other site: Secondary | ICD-10-CM | POA: Diagnosis not present

## 2021-02-17 DIAGNOSIS — G894 Chronic pain syndrome: Secondary | ICD-10-CM | POA: Diagnosis not present

## 2021-02-17 DIAGNOSIS — M4322 Fusion of spine, cervical region: Secondary | ICD-10-CM | POA: Diagnosis not present

## 2021-02-20 DIAGNOSIS — M810 Age-related osteoporosis without current pathological fracture: Secondary | ICD-10-CM | POA: Diagnosis not present

## 2021-02-21 ENCOUNTER — Other Ambulatory Visit: Payer: Self-pay | Admitting: Family Medicine

## 2021-02-21 DIAGNOSIS — I10 Essential (primary) hypertension: Secondary | ICD-10-CM

## 2021-03-04 DIAGNOSIS — G894 Chronic pain syndrome: Secondary | ICD-10-CM | POA: Diagnosis not present

## 2021-03-04 DIAGNOSIS — M792 Neuralgia and neuritis, unspecified: Secondary | ICD-10-CM | POA: Diagnosis not present

## 2021-03-04 DIAGNOSIS — Z981 Arthrodesis status: Secondary | ICD-10-CM | POA: Diagnosis not present

## 2021-03-05 ENCOUNTER — Other Ambulatory Visit: Payer: Self-pay | Admitting: Family Medicine

## 2021-03-18 DIAGNOSIS — Z981 Arthrodesis status: Secondary | ICD-10-CM | POA: Diagnosis not present

## 2021-03-18 DIAGNOSIS — Z4542 Encounter for adjustment and management of neuropacemaker (brain) (peripheral nerve) (spinal cord): Secondary | ICD-10-CM | POA: Diagnosis not present

## 2021-03-18 DIAGNOSIS — G894 Chronic pain syndrome: Secondary | ICD-10-CM | POA: Diagnosis not present

## 2021-03-18 DIAGNOSIS — M792 Neuralgia and neuritis, unspecified: Secondary | ICD-10-CM | POA: Diagnosis not present

## 2021-03-21 ENCOUNTER — Other Ambulatory Visit: Payer: Self-pay | Admitting: Neurology

## 2021-04-06 ENCOUNTER — Other Ambulatory Visit: Payer: Self-pay

## 2021-04-06 ENCOUNTER — Encounter: Payer: Self-pay | Admitting: Family Medicine

## 2021-04-06 ENCOUNTER — Ambulatory Visit: Payer: Medicare PPO | Admitting: Family Medicine

## 2021-04-06 VITALS — BP 140/80 | HR 85 | Temp 99.0°F | Ht 67.0 in | Wt 191.4 lb

## 2021-04-06 DIAGNOSIS — D649 Anemia, unspecified: Secondary | ICD-10-CM | POA: Diagnosis not present

## 2021-04-06 DIAGNOSIS — Z23 Encounter for immunization: Secondary | ICD-10-CM | POA: Diagnosis not present

## 2021-04-06 DIAGNOSIS — M5136 Other intervertebral disc degeneration, lumbar region: Secondary | ICD-10-CM

## 2021-04-06 DIAGNOSIS — J988 Other specified respiratory disorders: Secondary | ICD-10-CM | POA: Diagnosis not present

## 2021-04-06 DIAGNOSIS — E119 Type 2 diabetes mellitus without complications: Secondary | ICD-10-CM

## 2021-04-06 DIAGNOSIS — E785 Hyperlipidemia, unspecified: Secondary | ICD-10-CM

## 2021-04-06 NOTE — Assessment & Plan Note (Signed)
Continue Crestor 40 mg daily.  Check lipid panel.

## 2021-04-06 NOTE — Progress Notes (Signed)
Eric Sonnenberg, MD Phone: [phone]  Justin Patel is a 70 y.o. male who presents today for follow-up.  Diabetes: CBGs are typically 90-100.  He is taking Tradjenta.  No polyuria or polydipsia.  Rarely has hypoglycemia.  Notes he will get sweaty and typically eat something sugary and improved.  Notes he is due for his ophthalmology visit.  Chronic back pain: He has a temporary spinal cord stimulator in place.  Notes this has been somewhat beneficial.  He is on a trial of this.  He sees neurosurgery in about 3 weeks.  Hyperlipidemia: Taking Crestor.  No chest pain, right upper quadrant pain, or myalgias.  Upper respiratory illness: Patient reports he had of cough that was productive of phlegm several weeks ago.  He notes a little bit of wheezing at night per his wife.  Overall he feels quite a bit better.  Social History   Tobacco Use  Smoking Status Never  Smokeless Tobacco Never    Current Outpatient Medications on File Prior to Visit  Medication Sig Dispense Refill   amLODipine (NORVASC) 10 MG tablet TAKE ONE TABLET EVERY DAY 90 tablet 1   blood glucose meter kit and supplies KIT Dispense based on patient and insurance preference. Check once daily. ICD doing E11.9. 1 each 0   Calcium Carbonate-Vitamin D (CALCIUM 600/VITAMIN D PO) Take 1 tablet by mouth daily.     cyclobenzaprine (FLEXERIL) 5 MG tablet Take 5-10 mg by mouth every 8 (eight) hours as needed.     esomeprazole (NEXIUM) 40 MG capsule TAKE 1 CAPSULE BY MOUTH ONCE DAILY 90 capsule 1   furosemide (LASIX) 20 MG tablet TAKE ONE TABLET BY MOUTH EVERY DAY 90 tablet 1   gabapentin (NEURONTIN) 300 MG capsule Take by mouth.     metoprolol succinate (TOPROL-XL) 50 MG 24 hr tablet TAKE 1 TABLET BY MOUTH DAILY WITH OR IMMEDIATLY FOLLOWING A MEAL 90 tablet 1   Multiple Vitamin (MULTIVITAMIN) capsule Take 1 capsule by mouth daily. With copper and zinc     nystatin cream (MYCOSTATIN) Apply 1 application topically 2 (two) times  daily. 30 g 0   nystatin-triamcinolone ointment (MYCOLOG) Apply 1 application topically 2 (two) times daily. 30 g 0   primidone (MYSOLINE) 50 MG tablet TAKE 1 TABLET BY MOUTH AT BEDTIME 30 tablet 1   rosuvastatin (CRESTOR) 40 MG tablet TAKE ONE TABLET EVERY DAY 90 tablet 3   sucralfate (CARAFATE) 1 g tablet TAKE 1 TABLET BY MOUTH 4 TIMES DAILY 360 tablet 1   tamsulosin (FLOMAX) 0.4 MG CAPS capsule TAKE 1 CAPSULE BY MOUTH EVERY DAY 90 capsule 3   TRADJENTA 5 MG TABS tablet TAKE 1 TABLET BY MOUTH DAILY 90 tablet 1   traMADol (ULTRAM) 50 MG tablet Take 50 mg by mouth every 6 (six) hours as needed.     venlafaxine XR (EFFEXOR-XR) 75 MG 24 hr capsule Take 75 mg by mouth every morning.     No current facility-administered medications on file prior to visit.     ROS see history of present illness  Objective  Physical Exam Vitals:   04/06/21 1606  BP: 140/80  Pulse: 85  Temp: 99 F (37.2 C)  SpO2: 97%    BP Readings from Last 3 Encounters:  04/06/21 140/80  01/02/21 110/64  11/18/20 140/80   Wt Readings from Last 3 Encounters:  04/06/21 191 lb 6.4 oz (86.8 kg)  01/02/21 182 lb 3.2 oz (82.6 kg)  11/18/20 176 lb 3.2 oz (79.9 kg)      Physical Exam Constitutional:      General: He is not in acute distress.    Appearance: He is not diaphoretic.  Cardiovascular:     Rate and Rhythm: Normal rate and regular rhythm.     Heart sounds: Normal heart sounds.  Pulmonary:     Effort: Pulmonary effort is normal.     Breath sounds: Normal breath sounds. No wheezing.     Comments: Very mildly extended expiratory phase of breathing Skin:    General: Skin is warm and dry.  Neurological:     Mental Status: He is alert.     Assessment/Plan: Please see individual problem list.  Problem List Items Addressed This Visit     Anemia   Relevant Orders   CBC   DDD (degenerative disc disease), lumbar    Somewhat improved with the spinal cord stimulator.  He will continue to see his  neurosurgeon.      Diabetes ([place]) - Primary    Well-controlled.  Check A1c and urine microalbumin.  He will continue Tradjenta 5 mg daily.      Relevant Orders   Urine Microalbumin w/creat. ratio   HgB A1c   Hyperlipidemia    Continue Crestor 40 mg daily.  Check lipid panel.      Relevant Orders   Lipid panel   Respiratory infection    Mostly improved.  Slight prolongation of his expiratory phase.  Discussed that he continue to monitor and if he does not continue to improve he should let us know.      Other Visit Diagnoses     Need for immunization against influenza       Relevant Orders   Flu Vaccine QUAD High Dose(Fluad) (Completed)        Return in about 4 months (around 08/06/2021).  This visit occurred during the SARS-CoV-2 public health emergency.  Safety protocols were in place, including screening questions prior to the visit, additional usage of staff PPE, and extensive cleaning of exam room while observing appropriate contact time as indicated for disinfecting solutions.    [name], MD [place]

## 2021-04-06 NOTE — Assessment & Plan Note (Signed)
Mostly improved.  Slight prolongation of his expiratory phase.  Discussed that he continue to monitor and if he does not continue to improve he should let us know.

## 2021-04-06 NOTE — Patient Instructions (Signed)
Nice to see you. We will get lab work today and contact you with results. If your slight wheezing does not continue to improve please let us know.

## 2021-04-06 NOTE — Assessment & Plan Note (Signed)
Somewhat improved with the spinal cord stimulator.  He will continue to see his neurosurgeon.

## 2021-04-06 NOTE — Assessment & Plan Note (Signed)
Well-controlled.  Check A1c and urine microalbumin.  He will continue Tradjenta 5 mg daily.

## 2021-04-07 LAB — LIPID PANEL
Cholesterol: 157 mg/dL (ref 0–200)
HDL: 60.9 mg/dL (ref >39.00)
LDL Cholesterol: 76 mg/dL (ref 0–99)
NonHDL: 96.09
Total CHOL/HDL Ratio: 3
Triglycerides: 102 mg/dL (ref 0.0–149.0)
VLDL: 20.4 mg/dL (ref 0.0–40.0)

## 2021-04-07 LAB — CBC
HCT: 42 % (ref 39.0–52.0)
Hemoglobin: 13.7 g/dL (ref 13.0–17.0)
MCHC: 32.6 g/dL (ref 30.0–36.0)
MCV: 99.5 fl (ref 78.0–100.0)
Platelets: 208 10*3/uL (ref 150.0–400.0)
RBC: 4.22 Mil/uL (ref 4.22–5.81)
RDW: 13.9 % (ref 11.5–15.5)
WBC: 9.7 10*3/uL (ref 4.0–10.5)

## 2021-04-07 LAB — HEMOGLOBIN A1C: Hgb A1c MFr Bld: 7 % — ABNORMAL HIGH (ref 4.6–6.5)

## 2021-04-07 LAB — MICROALBUMIN / CREATININE URINE RATIO
Creatinine,U: 103.8 mg/dL
Microalb Creat Ratio: 25 mg/g (ref 0.0–30.0)
Microalb, Ur: 26 mg/dL — ABNORMAL HIGH (ref 0.0–1.9)

## 2021-04-10 MED ORDER — EZETIMIBE 10 MG PO TABS: 10.0000 mg | ORAL_TABLET | Freq: Every day | ORAL | 1 refills | Status: DC

## 2021-04-10 NOTE — Addendum Note (Signed)
Addended by: Fulton Mole D on: 04/10/2021 11:22 AM   Modules accepted: Orders

## 2021-04-10 NOTE — Addendum Note (Signed)
Addended by: Fulton Mole D on: 04/10/2021 11:20 AM   Modules accepted: Orders

## 2021-05-12 ENCOUNTER — Other Ambulatory Visit: Payer: Self-pay | Admitting: Neurology

## 2021-05-20 DIAGNOSIS — G894 Chronic pain syndrome: Secondary | ICD-10-CM | POA: Diagnosis not present

## 2021-05-20 DIAGNOSIS — M792 Neuralgia and neuritis, unspecified: Secondary | ICD-10-CM | POA: Diagnosis not present

## 2021-05-20 DIAGNOSIS — Z6830 Body mass index (BMI) 30.0-30.9, adult: Secondary | ICD-10-CM | POA: Diagnosis not present

## 2021-05-20 DIAGNOSIS — I10 Essential (primary) hypertension: Secondary | ICD-10-CM | POA: Diagnosis not present

## 2021-05-27 ENCOUNTER — Other Ambulatory Visit: Payer: Self-pay

## 2021-05-27 ENCOUNTER — Other Ambulatory Visit (INDEPENDENT_AMBULATORY_CARE_PROVIDER_SITE_OTHER): Payer: Medicare PPO

## 2021-05-27 DIAGNOSIS — E785 Hyperlipidemia, unspecified: Secondary | ICD-10-CM | POA: Diagnosis not present

## 2021-05-27 LAB — LDL CHOLESTEROL, DIRECT: Direct LDL: 70 mg/dL

## 2021-06-02 ENCOUNTER — Other Ambulatory Visit: Payer: Self-pay | Admitting: Family Medicine

## 2021-06-04 ENCOUNTER — Other Ambulatory Visit: Payer: Self-pay | Admitting: Neurology

## 2021-06-06 ENCOUNTER — Other Ambulatory Visit: Payer: Self-pay

## 2021-06-06 ENCOUNTER — Emergency Department
Admission: EM | Admit: 2021-06-06 | Discharge: 2021-06-07 | Disposition: A | Discharge status: Home / Self Care | Payer: Medicare PPO | Attending: Emergency Medicine | Admitting: Emergency Medicine

## 2021-06-06 ENCOUNTER — Encounter: Payer: Self-pay | Admitting: Emergency Medicine

## 2021-06-06 DIAGNOSIS — K5730 Diverticulosis of large intestine without perforation or abscess without bleeding: Secondary | ICD-10-CM | POA: Diagnosis not present

## 2021-06-06 DIAGNOSIS — R1084 Generalized abdominal pain: Secondary | ICD-10-CM | POA: Diagnosis not present

## 2021-06-06 DIAGNOSIS — E1122 Type 2 diabetes mellitus with diabetic chronic kidney disease: Secondary | ICD-10-CM | POA: Diagnosis not present

## 2021-06-06 DIAGNOSIS — Z85828 Personal history of other malignant neoplasm of skin: Secondary | ICD-10-CM | POA: Insufficient documentation

## 2021-06-06 DIAGNOSIS — G8929 Other chronic pain: Secondary | ICD-10-CM | POA: Diagnosis not present

## 2021-06-06 DIAGNOSIS — R748 Abnormal levels of other serum enzymes: Secondary | ICD-10-CM | POA: Insufficient documentation

## 2021-06-06 DIAGNOSIS — I2510 Atherosclerotic heart disease of native coronary artery without angina pectoris: Secondary | ICD-10-CM | POA: Diagnosis not present

## 2021-06-06 DIAGNOSIS — K922 Gastrointestinal hemorrhage, unspecified: Secondary | ICD-10-CM

## 2021-06-06 DIAGNOSIS — K5731 Diverticulosis of large intestine without perforation or abscess with bleeding: Secondary | ICD-10-CM | POA: Diagnosis not present

## 2021-06-06 DIAGNOSIS — Z96652 Presence of left artificial knee joint: Secondary | ICD-10-CM | POA: Insufficient documentation

## 2021-06-06 DIAGNOSIS — R109 Unspecified abdominal pain: Secondary | ICD-10-CM | POA: Diagnosis not present

## 2021-06-06 DIAGNOSIS — N281 Cyst of kidney, acquired: Secondary | ICD-10-CM | POA: Diagnosis not present

## 2021-06-06 DIAGNOSIS — I130 Hypertensive heart and chronic kidney disease with heart failure and stage 1 through stage 4 chronic kidney disease, or unspecified chronic kidney disease: Secondary | ICD-10-CM | POA: Diagnosis not present

## 2021-06-06 DIAGNOSIS — D649 Anemia, unspecified: Secondary | ICD-10-CM | POA: Diagnosis not present

## 2021-06-06 DIAGNOSIS — Z79899 Other long term (current) drug therapy: Secondary | ICD-10-CM | POA: Insufficient documentation

## 2021-06-06 DIAGNOSIS — I10 Essential (primary) hypertension: Secondary | ICD-10-CM | POA: Diagnosis not present

## 2021-06-06 DIAGNOSIS — Z9049 Acquired absence of other specified parts of digestive tract: Secondary | ICD-10-CM | POA: Diagnosis not present

## 2021-06-06 DIAGNOSIS — R1012 Left upper quadrant pain: Secondary | ICD-10-CM | POA: Diagnosis not present

## 2021-06-06 DIAGNOSIS — G894 Chronic pain syndrome: Secondary | ICD-10-CM

## 2021-06-06 DIAGNOSIS — I509 Heart failure, unspecified: Secondary | ICD-10-CM | POA: Insufficient documentation

## 2021-06-06 DIAGNOSIS — N183 Chronic kidney disease, stage 3 unspecified: Secondary | ICD-10-CM | POA: Insufficient documentation

## 2021-06-06 DIAGNOSIS — R42 Dizziness and giddiness: Secondary | ICD-10-CM | POA: Diagnosis not present

## 2021-06-06 DIAGNOSIS — R58 Hemorrhage, not elsewhere classified: Secondary | ICD-10-CM | POA: Diagnosis not present

## 2021-06-06 LAB — COMPREHENSIVE METABOLIC PANEL
ALT: 31 U/L (ref 0–44)
AST: 31 U/L (ref 15–41)
Albumin: 4 g/dL (ref 3.5–5.0)
Alkaline Phosphatase: 41 U/L (ref 38–126)
Anion gap: 10 (ref 5–15)
BUN: 17 mg/dL (ref 8–23)
CO2: 28 mmol/L (ref 22–32)
Calcium: 9.5 mg/dL (ref 8.9–10.3)
Chloride: 100 mmol/L (ref 98–111)
Creatinine, Ser: 1 mg/dL (ref 0.61–1.24)
GFR, Estimated: >60 mL/min (ref >60)
Glucose, Bld: 131 mg/dL — ABNORMAL HIGH (ref 70–99)
Potassium: 4.1 mmol/L (ref 3.5–5.1)
Sodium: 138 mmol/L (ref 135–145)
Total Bilirubin: 0.6 mg/dL (ref 0.3–1.2)
Total Protein: 7.3 g/dL (ref 6.5–8.1)

## 2021-06-06 LAB — CBC
HCT: 39.2 % (ref 39.0–52.0)
Hemoglobin: 13 g/dL (ref 13.0–17.0)
MCH: 32.9 pg (ref 26.0–34.0)
MCHC: 33.2 g/dL (ref 30.0–36.0)
MCV: 99.2 fL (ref 80.0–100.0)
Platelets: 274 10*3/uL (ref 150–400)
RBC: 3.95 MIL/uL — ABNORMAL LOW (ref 4.22–5.81)
RDW: 14.4 % (ref 11.5–15.5)
WBC: 9.5 10*3/uL (ref 4.0–10.5)
nRBC: 0 % (ref 0.0–0.2)

## 2021-06-06 LAB — LIPASE, BLOOD: Lipase: 74 U/L — ABNORMAL HIGH (ref 11–51)

## 2021-06-06 LAB — TYPE AND SCREEN
ABO/RH(D): AB POS
Antibody Screen: NEGATIVE

## 2021-06-06 NOTE — ED Triage Notes (Signed)
First nurse note: pt comes ems from home with black, tarry stools for 4 days. Hx of bleeds and bypass. C/o upper left abd pain. Tender with palpation. Dizziness with standing. VSS. Ambulatory on scene.

## 2021-06-06 NOTE — ED Triage Notes (Signed)
Pt via EMS from home. Pt c/o black tarry stool for the past 4 days. Pt states that he has been lightheaded and dizzy also. Denies SOB. Pt also c/o RLQ pain and nausea. Pt has a hx of a GI bleed in the past in 2013. 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  Pt is A&Ox4 and NAD

## 2021-06-06 NOTE — ED Provider Notes (Signed)
[facility] Emergency Department Provider Note  ____________________________________________   Event Date/Time   First MD Initiated Contact with Patient 06/06/21 2332     (approximate)  I have reviewed the triage vital signs and the nursing notes.   HISTORY  Chief Complaint Abdominal Pain and Nausea    HPI Justin Patel is a 70 y.o. male with complicated and extensive past medical history including multiple prior abdominal surgeries including gastric bypass, cholecystectomy, and appendectomy.  He has also had prior GI bleeding due to peptic ulcer disease.  He presents for evaluation of about 4 days of left upper quadrant abdominal pain that is waxing and waning but has gotten worse.  Additionally he has had black and tarry stools for about 2 days.  He occasionally gets iron infusions but has not had any for quite a while because his hemoglobin has been stable.  He has also felt lightheaded and occasionally dizzy for the last couple of days.  He has had nausea but no vomiting.  He reports a subjective fever.  He denies sore throat, chest pain, shortness of breath, cough, vomiting, diarrhea.  Nothing in particular makes the symptoms better or worse and they are gradually worsening over time and are at least moderate in severity.       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  removal scheduled to clear margins.   CHF (congestive heart failure) (HCC)    no longer after weight loss   Chronic kidney disease    cysts   Degenerative disc disease    Diabetes mellitus    no longer diabetic-or on meds   Family history of adverse reaction to anesthesia    sons wake up combative   Gastric ulcer    GERD (gastroesophageal reflux disease)    H/O hiatal hernia    Headache(784.0)    sinus   Heart murmur     Hx of congestive heart failure    Hyperlipidemia    Hypertension    Iron deficiency anemia 02/28/2015   Opiate abuse, continuous ([place]) 06/20/2015   Sacral fracture, closed ([place])    Seizures ([place])    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, after GI bleed   Sleep apnea    hx not now since wt loss   Stones in the urinary tract    Syncope and collapse     Patient Active Problem List   Diagnosis Date Noted   Respiratory infection 04/06/2021   Chronic back pain 09/25/2020   Aortic atherosclerosis ([place]) 05/20/2020   Common bile duct dilatation 05/20/2020   S/P laparoscopic cholecystectomy 02/18/2020   History of gastric bypass    Fracture of L1 vertebra ([place]) 01/14/2020   Arthralgia 08/17/2019   Mass of submandibular region 08/17/2019   Pain of right thumb 08/17/2019   Numbness of face 05/22/2019   Chronic night sweats 03/21/2019   Chronic abdominal pain 02/27/2019   Muscle weakness 08/24/2018   Dyspnea on exertion 08/24/2018   Depression, major, single episode, mild ([place]) 08/24/2018   Macrocytosis without anemia 07/05/2018   RLQ abdominal pain 05/18/2018   CKD (chronic kidney disease) stage 3, GFR 30-59 ml/min 05/08/2018   Lumbar scoliosis 04/25/2018   Easy bruising 12/09/2017   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   Abnormal weight loss 10/18/2017  GERD (gastroesophageal reflux disease) 10/18/2017   Constipation 10/18/2017   Osteoporosis without current pathological fracture 07/20/2017   Vitamin D deficiency 07/20/2017   Rash 03/11/2017   Seborrheic dermatitis 02/24/2017   Intertrigo 02/24/2017   Tachycardia 09/27/2016   Anxiety 06/25/2016   Cervical facet syndrome 03/15/2016   Facet syndrome, lumbar 03/15/2016   DDD (degenerative disc disease), cervical 03/15/2016   DDD (degenerative disc disease), lumbar 03/15/2016   Other iron deficiency anemias 11/27/2015   Recurrent falls 07/25/2015   Chronic pain syndrome 04/16/2015   Alcohol abuse 10/01/2014   H/O total  knee replacement 05/28/2014   BPH (benign prostatic hyperplasia) 11/16/2013   Enlarged prostate 11/08/2013   Anemia 11/08/2013   H/O urinary stone 10/05/2013   Renal cyst 08/27/2013   Diabetes ([place]) 03/21/2013   Essential hypertension, benign 03/21/2013   Hyperlipidemia 03/21/2013   Overweight (BMI 25.0-29.9) 03/21/2013    Past Surgical History:  Procedure Laterality Date   ANTERIOR CERVICAL DECOMP/DISCECTOMY FUSION  01/26/2012   Procedure: ANTERIOR CERVICAL DECOMPRESSION/DISCECTOMY FUSION 3 LEVELS;  Surgeon: Floyce Stakes, MD;  Location: [facility];  Service: Neurosurgery;  Laterality: N/A;  Cervical three-four Cervical four-five Cervical five-six Cervical six-seven , Anterior cervical decompression/diskectomy, fusion, plate   ANTERIOR LAT LUMBAR FUSION Right 04/25/2018   Procedure: Right Lumbar two-three Lumbar three-four Lumbar four-five anterior  lateral interbody fusion  with posterior percutaneous pedicle screws;  Surgeon: Erline Levine, MD;  Location: [place];  Service: Neurosurgery;  Laterality: Right;   [place], normal   CARDIAC CATHETERIZATION     [place]   COLONOSCOPY WITH PROPOFOL N/A 12/27/2017   Procedure: COLONOSCOPY WITH PROPOFOL;  Surgeon: Toledo, Benay Pike, MD;  Location: [facility];  Service: Gastroenterology;  Laterality: N/A;   ESOPHAGOGASTRODUODENOSCOPY (EGD) WITH PROPOFOL N/A 12/27/2017   Procedure: ESOPHAGOGASTRODUODENOSCOPY (EGD) WITH PROPOFOL;  Surgeon: Toledo, Benay Pike, MD;  Location: [facility];  Service: Gastroenterology;  Laterality: N/A;   GASTRIC BYPASS  2010   [place]   hiatal   JOINT REPLACEMENT     KNEE ARTHROSCOPY     bilateral, left x 2   LAMINECTOMY WITH POSTERIOR LATERAL ARTHRODESIS LEVEL 4 N/A 01/14/2020   Procedure: Decompressive Laminectomy Lumbar One with pedicle screw fixation from Thoracic Ten to Lumbar Two;  Surgeon: Erline Levine, MD;   Location: [place];  Service: Neurosurgery;  Laterality: N/A;  Decompressive Laminectomy Lumbar One with pedicle screw fixation from Thoracic Ten to Lumbar Two   LUMBAR PERCUTANEOUS PEDICLE SCREW 3 LEVEL N/A 04/25/2018   Procedure: LUMBAR PERCUTANEOUS PEDICLE SCREW 3 LEVEL;  Surgeon: Erline Levine, MD;  Location: [place];  Service: Neurosurgery;  Laterality: N/A;   NASAL SINUS SURGERY     x5   OSTEOTOMY  2001   left   TONSILLECTOMY     TOTAL KNEE ARTHROPLASTY Left 05/2014   Dr. Kym Groom  2011    Prior to Admission medications   Medication Sig Start Date End Date Taking? Authorizing Provider  amLODipine (NORVASC) 10 MG tablet TAKE ONE TABLET EVERY DAY 02/23/21   [name], MD  blood glucose meter kit and supplies KIT Dispense based on patient and insurance preference. Check once daily. ICD doing E11.9. 06/30/16   [name], MD  Calcium Carbonate-Vitamin D (CALCIUM 600/VITAMIN D PO) Take 1 tablet by mouth daily.    [provider]  cyclobenzaprine (FLEXERIL) 5 MG tablet Take  5-10 mg by mouth every 8 (eight) hours as needed. 05/14/20   [provider]  esomeprazole (NEXIUM) 40 MG capsule TAKE 1 CAPSULE BY MOUTH ONCE DAILY 06/20/20   [name], MD  ezetimibe (ZETIA) 10 MG tablet Take 1 tablet (10 mg total) by mouth daily. 04/10/21   [name], MD  furosemide (LASIX) 20 MG tablet TAKE ONE TABLET BY MOUTH EVERY DAY 12/18/20   [name], MD  gabapentin (NEURONTIN) 300 MG capsule Take by mouth. 03/13/21   [provider]  metoprolol succinate (TOPROL-XL) 50 MG 24 hr tablet TAKE 1 TABLET BY MOUTH DAILY WITH OR IMMEDIATLY FOLLOWING A MEAL 02/23/21   [name], MD  Multiple Vitamin (MULTIVITAMIN) capsule Take 1 capsule by mouth daily. With copper and zinc 07/11/18   [name], MD  nystatin cream (MYCOSTATIN) Apply 1 application topically 2 (two) times daily. 05/03/17   [name], MD   nystatin-triamcinolone ointment [facility]) Apply 1 application topically 2 (two) times daily. 05/08/18   [name], MD  primidone (MYSOLINE) 50 MG tablet TAKE 1 TABLET BY MOUTH AT BEDTIME 06/05/21   Tomi Likens, Adam R, DO  rosuvastatin (CRESTOR) 40 MG tablet TAKE ONE TABLET EVERY DAY 10/10/20   Wellington Hampshire, MD  sucralfate (CARAFATE) 1 g tablet TAKE 1 TABLET BY MOUTH 4 TIMES DAILY 06/04/21   [name], MD  tamsulosin (FLOMAX) 0.4 MG CAPS capsule TAKE 1 CAPSULE BY MOUTH EVERY DAY 12/01/20   [name], MD  TRADJENTA 5 MG TABS tablet TAKE 1 TABLET BY MOUTH DAILY 06/04/21   [name], MD  traMADol (ULTRAM) 50 MG tablet Take 50 mg by mouth every 6 (six) hours as needed. 12/30/20   [provider]  venlafaxine XR (EFFEXOR-XR) 75 MG 24 hr capsule TAKE ONE CAPSULE EVERY MORNING WITH BREAKFAST 06/04/21   [name], MD    Allergies Altace [ramipril], Levaquin [levofloxacin in d5w], Levofloxacin, Conray [iothalamate], Lyrica [pregabalin], Metformin, and Trulicity [dulaglutide]  Family History  Problem Relation Age of Onset   Dementia Mother 62   Osteoporosis Mother    Heart disease Father 11   Diabetes Father    Lymphoma Sister        lymphoma, stage 19   Ovarian cancer Sister 39       Ovarian   Lupus Sister    Prostate cancer Maternal Uncle    Skin cancer Maternal Uncle    Tongue cancer Maternal Uncle        uncle died of heart attack   Alcoholism Maternal Uncle    Lung cancer Paternal Aunt        pat aunts x 4 died of lung cancer   Lung cancer Paternal Aunt    Lung cancer Paternal Aunt    Lung cancer Paternal Aunt    Mesothelioma Cousin        paternal cousin    Social History Social History   Tobacco Use   Smoking status: Never   Smokeless tobacco: Never  Vaping Use   Vaping Use: Never used  Substance Use Topics   Alcohol use: No   Drug use: No    Review of Systems Constitutional: Subjective fever, no chills. Eyes: No  visual changes. ENT: No sore throat. Cardiovascular: Denies chest pain. Respiratory: Denies shortness of breath. Gastrointestinal: Positive for left upper quadrant pain x4 days.  Positive for black and tarry stools.  Negative for vomiting. Genitourinary: Negative for dysuria. Musculoskeletal: Positive  for chronic back pain. Integumentary: Negative for rash. Neurological: Negative for headaches, focal weakness or numbness.   ____________________________________________   PHYSICAL EXAM:  VITAL SIGNS: ED Triage Vitals  Enc Vitals Group     BP 06/06/21 1815 (!) 147/101     Pulse Rate 06/06/21 1815 76     Resp 06/06/21 1815 18     Temp 06/06/21 1815 98.8 F (37.1 C)     Temp Source 06/06/21 1815 Oral     SpO2 06/06/21 1815 100 %     Weight 06/06/21 1818 79.8 kg (176 lb)     Height 06/06/21 1818 1.702 m (5' 7" )     Head Circumference --      Peak Flow --      Pain Score 06/06/21 1818 6     Pain Loc --      Pain Edu? --      Excl. in [place]? --     Constitutional: Alert and oriented.  Patient does not appear to be in distress. Eyes: Conjunctivae are normal.  Head: Atraumatic. Nose: No congestion/rhinnorhea. Mouth/Throat: Patient is wearing a mask. Neck: No stridor.  No meningeal signs.   Cardiovascular: Normal rate, regular rhythm. Good peripheral circulation. Respiratory: Normal respiratory effort.  No retractions. Gastrointestinal: Soft and nondistended.  Patient has some tenderness to palpation of the left upper quadrant but it does not appear to be severe and he continues to talk throughout the abdominal exam.  Rectal exam is notable for no gross blood but strongly heme positive dark-colored but not soft or tarry stool.  Does not really meet criteria for melena but is heme positive. Musculoskeletal: No lower extremity tenderness nor edema. No gross deformities of extremities. Neurologic:  Normal speech and language. No gross focal neurologic deficits are appreciated.  Skin:   Skin is warm, dry and intact. Psychiatric: Mood and affect are normal. Speech and behavior are normal.  Very talkative and interactive, appears to be in good spirits.  ____________________________________________   LABS (all labs ordered are listed, but only abnormal results are displayed)  Labs Reviewed  LIPASE, BLOOD - Abnormal; Notable for the following components:      Result Value   Lipase 74 (*)    All other components within normal limits  COMPREHENSIVE METABOLIC PANEL - Abnormal; Notable for the following components:   Glucose, Bld 131 (*)    All other components within normal limits  CBC - Abnormal; Notable for the following components:   RBC 3.95 (*)    All other components within normal limits  URINALYSIS, ROUTINE W REFLEX MICROSCOPIC  TYPE AND SCREEN   ____________________________________________  EKG  ED ECG REPORT I, Hinda Kehr, the attending physician, personally viewed and interpreted this ECG.  Date: 06/06/2021 EKG Time: 18: 24 Rate: 83 Rhythm: normal sinus rhythm QRS Axis: normal Intervals: normal ST/T Wave abnormalities: normal Narrative Interpretation: no evidence of acute ischemia  ____________________________________________  RADIOLOGY I, Hinda Kehr, personally viewed and evaluated these images (plain radiographs) as part of my medical decision making, as well as reviewing the written report by the radiologist.  ED MD interpretation: No acute abnormality identified on CT scan, patient does have diverticulosis  Official radiology report(s): CT Angio Abd/Pel W and/or Wo Contrast  Result Date: 06/07/2021 CLINICAL DATA:  Left upper quadrant abdominal pain, GI bleed, elevated lipase EXAM: CTA ABDOMEN AND PELVIS WITHOUT AND WITH CONTRAST TECHNIQUE: Multidetector CT imaging of the abdomen and pelvis was performed using the standard protocol during bolus administration of intravenous  contrast. Multiplanar reconstructed images and MIPs were obtained  and reviewed to evaluate the vascular anatomy. CONTRAST:  125m OMNIPAQUE IOHEXOL 350 MG/ML SOLN COMPARISON:  MRI abdomen dated 05/08/2020. CT abdomen/pelvis dated 01/28/2020. FINDINGS: VASCULAR Aorta: No evidence of aneurysm or dissection. Atherosclerotic calcifications. Patent. Celiac: Patent. SMA: Patent. Renals: Patent bilaterally. IMA: Patent. Inflow: Patent.  Atherosclerotic calcifications. Proximal Outflow: Patent. Veins: Within normal limits. Review of the MIP images confirms the above findings. NON-VASCULAR Lower chest: Lung bases are clear. Hepatobiliary: Liver is within normal limits. Status post cholecystectomy. No intrahepatic ductal dilatation. Common duct measures 14 mm and smoothly tapers at the ampulla. Pancreas: Within normal limits. Spleen: Within normal limits. Adrenals/Urinary Tract: Adrenal glands are within normal limits. Multiple bilateral renal cysts measuring up to 6.7 cm in the left upper kidney (series 7/image 29), simple (Bosniak I). No enhancing renal lesions. No renal calculi or hydronephrosis. Bladder is within normal limits. Stomach/Bowel: Stomach is notable for postsurgical changes related to gastric bypass with patent gastrojejunostomy. No evidence of bowel obstruction. Small bowel resection with suture line along the anterior abdomen. Appendix is not discretely visualized, reportedly surgically absent. Sigmoid diverticulosis, without evidence of diverticulitis. No bowel wall thickening or inflammatory changes. No intraluminal contrast intravasation to suggest active GI bleeding. Lymphatic: No suspicious abdominopelvic lymphadenopathy. Reproductive: Prostate is unremarkable. Other: No abdominopelvic ascites. Musculoskeletal: Mild superior endplate compression fracture deformity at L1, unchanged. Status post posterior fixation at T10 through L5. Mild multilevel degenerative changes. IMPRESSION: No evidence of active GI bleeding on CT. Sigmoid diverticulosis, without evidence of  diverticulitis. Status post gastric bypass with patent gastrojejunostomy. No evidence of bowel obstruction. Prior appendectomy. Additional ancillary findings as above. Electronically Signed   By: SJulian HyM.D.   On: 06/07/2021 01:20    ____________________________________________   PROCEDURES   Procedure(s) performed (including Critical Care):  Procedures   ____________________________________________   INITIAL IMPRESSION / MDM / [place]/ ED COURSE  As part of my medical decision making, I reviewed the following data within the e[facility]notes reviewed and incorporated, Labs reviewed , Old chart reviewed, Notes from prior ED visits, and [place] Controlled Substance Database   Differential diagnosis includes, but is not limited to, diverticulosis, neoplasm, AV malformation, peptic ulcer disease, other nonspecific stomach ulcer or upper GI bleed, variceal bleeding, colitis.  Patient does not exactly have melena but he does have heme positive stool.  However his vital signs are stable and within normal limits.  His CBC is notable for a normal hemoglobin of 13.0 with no leukocytosis.  Comprehensive metabolic panel is all within normal limits but he has a very slight elevation of his lipase.  He has no epigastric tenderness nor right upper quadrant tenderness but he does have some left upper quadrant tenderness and that is where he is feeling his pain, but he also reports that he has had pain in this area before.  To try to get a full assessment of what is going on in his abdomen as well as looking for any possible source of GI bleed, I have ordered a CTA abdomen/pelvis, which should evaluate for possible sources of bleeding as well as to get a good look at his pancreas.  Patient is not requesting any pain or nausea medicine at this time and he is stable for further assessment.  He understands and agrees with the plan.            ____________________________________________  FINAL CLINICAL IMPRESSION(S) / ED DIAGNOSES  Final diagnoses:  Elevated lipase  Gastrointestinal hemorrhage, unspecified gastrointestinal hemorrhage type  Chronic pain syndrome     MEDICATIONS GIVEN DURING THIS VISIT:  Medications  iohexol (OMNIPAQUE) 350 MG/ML injection 100 mL (100 mLs Intravenous Contrast Given 06/07/21 0041)  morphine 4 MG/ML injection 4 mg (4 mg Intravenous Given 06/07/21 0159)     ED Discharge Orders     None        Note:  This document was prepared using Dragon voice recognition software and may include unintentional dictation errors.   Hinda Kehr, MD 06/07/21 [phone]

## 2021-06-06 NOTE — ED Notes (Signed)
Pt placed on cardiac, BP, pulse ox monitors.

## 2021-06-07 ENCOUNTER — Emergency Department: Payer: Medicare PPO

## 2021-06-07 DIAGNOSIS — N281 Cyst of kidney, acquired: Secondary | ICD-10-CM | POA: Diagnosis not present

## 2021-06-07 DIAGNOSIS — K5730 Diverticulosis of large intestine without perforation or abscess without bleeding: Secondary | ICD-10-CM | POA: Diagnosis not present

## 2021-06-07 DIAGNOSIS — Z9049 Acquired absence of other specified parts of digestive tract: Secondary | ICD-10-CM | POA: Diagnosis not present

## 2021-06-07 DIAGNOSIS — R109 Unspecified abdominal pain: Secondary | ICD-10-CM | POA: Diagnosis not present

## 2021-06-07 DIAGNOSIS — K922 Gastrointestinal hemorrhage, unspecified: Secondary | ICD-10-CM | POA: Diagnosis not present

## 2021-06-07 MED ORDER — IOHEXOL 350 MG/ML SOLN
100.0000 mL | Freq: Once | INTRAVENOUS | Status: AC | PRN
  Administered 2021-06-07: 100 mL via INTRAVENOUS

## 2021-06-07 MED ORDER — MORPHINE SULFATE (PF) 4 MG/ML IV SOLN
4.0000 mg | Freq: Once | INTRAVENOUS | Status: AC
Start: 2021-06-07 — End: 2021-06-07
  Administered 2021-06-07: 4 mg via INTRAVENOUS
  Filled 2021-06-07: qty 1

## 2021-06-07 NOTE — Discharge Instructions (Signed)
As we discussed, although you do have blood in your stool, your overall exam was very reassuring including a stable hemoglobin.  We recommend you call Dr. Alice Reichert for the next available appointment.  We also sent a message through the computer to Dr. Alice Reichert so that he would be aware of your visit.  Please continue taking your regular medications.  You can follow-up with your primary care doctor or Dr. Alice Reichert or another GI specialist at the next available opportunity.  Return to the emergency department if you develop new or worsening symptoms that concern you.

## 2021-06-08 ENCOUNTER — Other Ambulatory Visit: Payer: Self-pay | Admitting: Family Medicine

## 2021-06-08 DIAGNOSIS — I10 Essential (primary) hypertension: Secondary | ICD-10-CM

## 2021-06-16 DIAGNOSIS — R933 Abnormal findings on diagnostic imaging of other parts of digestive tract: Secondary | ICD-10-CM | POA: Diagnosis not present

## 2021-06-16 DIAGNOSIS — Z9884 Bariatric surgery status: Secondary | ICD-10-CM | POA: Diagnosis not present

## 2021-06-16 DIAGNOSIS — R1012 Left upper quadrant pain: Secondary | ICD-10-CM | POA: Diagnosis not present

## 2021-06-16 DIAGNOSIS — K921 Melena: Secondary | ICD-10-CM | POA: Diagnosis not present

## 2021-06-17 ENCOUNTER — Other Ambulatory Visit: Payer: Self-pay

## 2021-06-17 ENCOUNTER — Encounter: Payer: Self-pay | Admitting: Dermatology

## 2021-06-17 ENCOUNTER — Ambulatory Visit: Payer: Medicare PPO | Admitting: Dermatology

## 2021-06-17 DIAGNOSIS — L578 Other skin changes due to chronic exposure to nonionizing radiation: Secondary | ICD-10-CM

## 2021-06-17 DIAGNOSIS — Z86018 Personal history of other benign neoplasm: Secondary | ICD-10-CM | POA: Diagnosis not present

## 2021-06-17 DIAGNOSIS — D1801 Hemangioma of skin and subcutaneous tissue: Secondary | ICD-10-CM | POA: Diagnosis not present

## 2021-06-17 DIAGNOSIS — Z1283 Encounter for screening for malignant neoplasm of skin: Secondary | ICD-10-CM | POA: Diagnosis not present

## 2021-06-17 DIAGNOSIS — D1800 Hemangioma unspecified site: Secondary | ICD-10-CM

## 2021-06-17 DIAGNOSIS — L814 Other melanin hyperpigmentation: Secondary | ICD-10-CM

## 2021-06-17 DIAGNOSIS — D692 Other nonthrombocytopenic purpura: Secondary | ICD-10-CM | POA: Diagnosis not present

## 2021-06-17 DIAGNOSIS — D229 Melanocytic nevi, unspecified: Secondary | ICD-10-CM

## 2021-06-17 DIAGNOSIS — L719 Rosacea, unspecified: Secondary | ICD-10-CM

## 2021-06-17 DIAGNOSIS — L821 Other seborrheic keratosis: Secondary | ICD-10-CM

## 2021-06-17 DIAGNOSIS — L918 Other hypertrophic disorders of the skin: Secondary | ICD-10-CM

## 2021-06-17 MED ORDER — METRONIDAZOLE 0.75 % EX GEL: 1.0000 "application " | Freq: Two times a day (BID) | CUTANEOUS | 3 refills | Status: AC

## 2021-06-17 NOTE — Progress Notes (Signed)
New Patient Visit  Subjective  Justin Patel is a 70 y.o. male who presents for the following: Lesions (On the R upper back - unknown how long the red papule has been there, but bruise appeared around it about 2 weeks ago. ). Patient would like a UBSE today - he has a hx of severely dysplastic nevus. The patient presents for Upper Body Skin Exam (UBSE) for skin cancer screening and mole check.  The following portions of the chart were reviewed this encounter and updated as appropriate:   Tobacco  Allergies  Meds  Problems  Med Hx  Surg Hx  Fam Hx     Review of Systems:  No other skin or systemic complaints except as noted in HPI or Assessment and Plan.  Objective  Well appearing patient in no apparent distress; mood and affect are within normal limits.  All skin waist up examined.  R upper arm Red papule with surrounding purpura.      Face Active papules of the face.          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  Observation.  Call clinic for new or changing lesions.  Recommend daily use of broad spectrum spf 30+ sunscreen to sun-exposed areas.   Rosacea Face Rosacea is a chronic progressive skin condition usually affecting the face of adults, causing redness and/or acne bumps. It is treatable but not curable. It sometimes affects the eyes (ocular rosacea) as well. It may respond to topical and/or systemic medication and can flare with stress, sun exposure, alcohol, exercise and some foods.  Daily application of broad spectrum spf 30+ sunscreen to face is recommended to reduce flares.  Start Metrogel BID over the nose, cheeks, and chin.   metroNIDAZOLE (METROGEL) 0.75 % gel - Face Apply 1 application topically 2 (two) times daily. For rosacea - apply to the cheeks, chin, and nose BID.  Skin cancer screening  History of Dysplastic Nevus - No evidence of recurrence today - Recommend regular full  body skin exams - Recommend daily broad spectrum sunscreen SPF 30+ to sun-exposed areas, reapply every 2 hours as needed.  - Call if any new or changing lesions are noted between office visits  Lentigines - Scattered tan macules - Due to sun exposure - Benign-appearing, observe - Recommend daily broad spectrum sunscreen SPF 30+ to sun-exposed areas, reapply every 2 hours as needed. - Call for any changes  Seborrheic Keratoses - Stuck-on, waxy, tan-brown papules and/or plaques  - Benign-appearing - Discussed benign etiology and prognosis. - Observe - Call for any changes  Melanocytic Nevi - Tan-brown and/or pink-flesh-colored symmetric macules and papules - Benign appearing on exam today - Observation - Call clinic for new or changing moles - Recommend daily use of broad spectrum spf 30+ sunscreen to sun-exposed areas.   Hemangiomas - Red papules - Discussed benign nature - Observe - Call for any changes  Actinic Damage - Chronic condition, secondary to cumulative UV/sun exposure - diffuse scaly erythematous macules with underlying dyspigmentation - Recommend daily broad spectrum sunscreen SPF 30+ to sun-exposed areas, reapply every 2 hours as needed.  - Staying in the shade or wearing long sleeves, sun glasses (UVA+UVB protection) and wide brim hats (4-inch brim around the entire circumference of the hat) are also recommended for sun protection.  - Call for new or changing lesions.  Acrochordons (Skin Tags) - Fleshy, skin-colored pedunculated papules - Benign appearing.  - Observe. - If  desired, they can be removed with an in office procedure that is not covered by insurance. - Please call the clinic if you notice any new or changing lesions.  Return in about 1 year (around 06/17/2022) for TBSE; 4 mths rosacea f/u.  Luther Redo, CMA, am acting as scribe for Sarina Ser, MD . Documentation: I have reviewed the above documentation for accuracy and completeness, and  I agree with the above.  Sarina Ser, MD

## 2021-06-17 NOTE — Patient Instructions (Signed)

## 2021-06-18 ENCOUNTER — Encounter: Payer: Self-pay | Admitting: Gastroenterology

## 2021-06-19 ENCOUNTER — Ambulatory Visit
Admission: RE | Admit: 2021-06-19 | Discharge: 2021-06-19 | Disposition: A | Discharge status: Home / Self Care | Payer: Medicare PPO | Attending: Gastroenterology | Admitting: Gastroenterology

## 2021-06-19 ENCOUNTER — Encounter
Admission: RE | Disposition: A | Discharge status: Home / Self Care | Payer: Self-pay | Source: Home / Self Care | Attending: Gastroenterology

## 2021-06-19 ENCOUNTER — Ambulatory Visit: Payer: Medicare PPO | Admitting: Certified Registered Nurse Anesthetist

## 2021-06-19 ENCOUNTER — Other Ambulatory Visit: Payer: Self-pay | Admitting: Family Medicine

## 2021-06-19 ENCOUNTER — Encounter: Payer: Self-pay | Admitting: Gastroenterology

## 2021-06-19 DIAGNOSIS — Z9884 Bariatric surgery status: Secondary | ICD-10-CM | POA: Insufficient documentation

## 2021-06-19 DIAGNOSIS — K289 Gastrojejunal ulcer, unspecified as acute or chronic, without hemorrhage or perforation: Secondary | ICD-10-CM | POA: Diagnosis not present

## 2021-06-19 DIAGNOSIS — K219 Gastro-esophageal reflux disease without esophagitis: Secondary | ICD-10-CM | POA: Diagnosis not present

## 2021-06-19 DIAGNOSIS — K633 Ulcer of intestine: Secondary | ICD-10-CM | POA: Diagnosis not present

## 2021-06-19 DIAGNOSIS — Z96652 Presence of left artificial knee joint: Secondary | ICD-10-CM | POA: Diagnosis not present

## 2021-06-19 DIAGNOSIS — Z9049 Acquired absence of other specified parts of digestive tract: Secondary | ICD-10-CM | POA: Diagnosis not present

## 2021-06-19 DIAGNOSIS — I130 Hypertensive heart and chronic kidney disease with heart failure and stage 1 through stage 4 chronic kidney disease, or unspecified chronic kidney disease: Secondary | ICD-10-CM | POA: Insufficient documentation

## 2021-06-19 DIAGNOSIS — Z980 Intestinal bypass and anastomosis status: Secondary | ICD-10-CM | POA: Diagnosis not present

## 2021-06-19 DIAGNOSIS — R600 Localized edema: Secondary | ICD-10-CM

## 2021-06-19 DIAGNOSIS — K2950 Unspecified chronic gastritis without bleeding: Secondary | ICD-10-CM | POA: Insufficient documentation

## 2021-06-19 DIAGNOSIS — E1122 Type 2 diabetes mellitus with diabetic chronic kidney disease: Secondary | ICD-10-CM | POA: Insufficient documentation

## 2021-06-19 DIAGNOSIS — K921 Melena: Secondary | ICD-10-CM | POA: Diagnosis not present

## 2021-06-19 DIAGNOSIS — G4730 Sleep apnea, unspecified: Secondary | ICD-10-CM | POA: Diagnosis not present

## 2021-06-19 DIAGNOSIS — F32A Depression, unspecified: Secondary | ICD-10-CM | POA: Insufficient documentation

## 2021-06-19 DIAGNOSIS — E785 Hyperlipidemia, unspecified: Secondary | ICD-10-CM | POA: Diagnosis not present

## 2021-06-19 DIAGNOSIS — I509 Heart failure, unspecified: Secondary | ICD-10-CM | POA: Diagnosis not present

## 2021-06-19 DIAGNOSIS — N189 Chronic kidney disease, unspecified: Secondary | ICD-10-CM | POA: Diagnosis not present

## 2021-06-19 DIAGNOSIS — F419 Anxiety disorder, unspecified: Secondary | ICD-10-CM | POA: Insufficient documentation

## 2021-06-19 DIAGNOSIS — M1990 Unspecified osteoarthritis, unspecified site: Secondary | ICD-10-CM | POA: Diagnosis not present

## 2021-06-19 HISTORY — DX: Unspecified osteoarthritis, unspecified site: M19.90

## 2021-06-19 HISTORY — DX: Depression, unspecified: F32.A

## 2021-06-19 HISTORY — PX: ESOPHAGOGASTRODUODENOSCOPY (EGD) WITH PROPOFOL: SHX5813

## 2021-06-19 HISTORY — DX: Scoliosis, unspecified: M41.9

## 2021-06-19 LAB — GLUCOSE, CAPILLARY: Glucose-Capillary: 208 mg/dL — ABNORMAL HIGH (ref 70–99)

## 2021-06-19 SURGERY — ESOPHAGOGASTRODUODENOSCOPY (EGD) WITH PROPOFOL
Anesthesia: General

## 2021-06-19 MED ORDER — PROPOFOL 10 MG/ML IV BOLUS
INTRAVENOUS | Status: DC | PRN
  Administered 2021-06-19: 20 mg via INTRAVENOUS
  Administered 2021-06-19: 60 mg via INTRAVENOUS

## 2021-06-19 MED ORDER — LIDOCAINE HCL (PF) 2 % IJ SOLN
INTRAMUSCULAR | Status: AC
  Filled 2021-06-19: qty 5

## 2021-06-19 MED ORDER — LIDOCAINE HCL (CARDIAC) PF 100 MG/5ML IV SOSY
PREFILLED_SYRINGE | INTRAVENOUS | Status: DC | PRN
  Administered 2021-06-19: 50 mg via INTRAVENOUS

## 2021-06-19 MED ORDER — PROPOFOL 500 MG/50ML IV EMUL
INTRAVENOUS | Status: DC | PRN
  Administered 2021-06-19: 150 ug/kg/min via INTRAVENOUS

## 2021-06-19 MED ORDER — SODIUM CHLORIDE 0.9 % IV SOLN
INTRAVENOUS | Status: DC
  Administered 2021-06-19: 1000 mL via INTRAVENOUS

## 2021-06-19 MED ORDER — PROPOFOL 500 MG/50ML IV EMUL
INTRAVENOUS | Status: AC
  Filled 2021-06-19: qty 50

## 2021-06-19 NOTE — Op Note (Signed)
[facility] Gastroenterology Patient Name: Justin Patel Procedure Date: 06/19/2021 8:25 AM MRN: [id number] Account #: [id number] Date of Birth: 05/03/1951 Admit Type: Outpatient Age: 70 Room: [facility] ENDO ROOM 3 Gender: Male Note Status: Finalized Instrument Name: Altamese Cabal Endoscope [id number] Procedure:             Upper GI endoscopy Indications:           Melena Providers:             Andrey Farmer MD, MD Referring MD:          Angela Adam. Caryl Bis (Referring MD) Medicines:             Monitored Anesthesia Care Complications:         No immediate complications. Estimated blood loss:                         Minimal. Procedure:             Pre-Anesthesia Assessment:                        - Prior to the procedure, a History and Physical was                         performed, and patient medications and allergies were                         reviewed. The patient is competent. The risks and                         benefits of the procedure and the sedation options and                         risks were discussed with the patient. All questions                         were answered and informed consent was obtained.                         Patient identification and proposed procedure were                         verified by the physician, the nurse, the anesthetist                         and the technician in the endoscopy suite. Mental                         Status Examination: alert and oriented. Airway                         Examination: normal oropharyngeal airway and neck                         mobility. Respiratory Examination: clear to                         auscultation. CV Examination: normal. Prophylactic  Antibiotics: The patient does not require prophylactic                         antibiotics. Prior Anticoagulants: The patient has                         taken no previous anticoagulant or antiplatelet                          agents. ASA Grade Assessment: II - A patient with mild                         systemic disease. After reviewing the risks and                         benefits, the patient was deemed in satisfactory                         condition to undergo the procedure. The anesthesia                         plan was to use monitored anesthesia care (MAC).                         Immediately prior to administration of medications,                         the patient was re-assessed for adequacy to receive                         sedatives. The heart rate, respiratory rate, oxygen                         saturations, blood pressure, adequacy of pulmonary                         ventilation, and response to care were monitored                         throughout the procedure. The physical status of the                         patient was re-assessed after the procedure.                        After obtaining informed consent, the endoscope was                         passed under direct vision. Throughout the procedure,                         the patient's blood pressure, pulse, and oxygen                         saturations were monitored continuously. The Endoscope                         was introduced through the mouth, and advanced to the  second part of duodenum. The upper GI endoscopy was                         accomplished without difficulty. The patient tolerated                         the procedure well. Findings:      The examined esophagus was normal.      Evidence of a Roux-en-Y gastrojejunostomy was found. The gastrojejunal       anastomosis was characterized by ulceration. This was traversed. The       jejunojejunal anastomosis was characterized by healthy appearing mucosa.       Biopsies were taken with a cold forceps for histology. Estimated blood       loss was minimal.      The examined jejunum was normal. Impression:            - Normal esophagus.                         - Roux-en-Y gastrojejunostomy with gastrojejunal                         anastomosis characterized by ulceration. Biopsied.                        - Normal examined jejunum. Recommendation:        - Discharge patient to home.                        - Resume previous diet.                        - Continue present medications. For the PPI you are                         taking, I recommend breaking open the capsule and                         swallowing the powder or taking the powder with                         applesauce.                        - Await pathology results.                        - Repeat upper endoscopy in 3 months to check healing.                        - Return to referring physician as previously                         scheduled. Procedure Code(s):     --- Professional ---                        [phone], Esophagogastroduodenoscopy, flexible,                         transoral; with biopsy, single or multiple Diagnosis Code(s):     ---  Professional ---                        Z98.0, Intestinal bypass and anastomosis status                        K92.1, Melena (includes Hematochezia) CPT copyright 2019 American Medical Association. All rights reserved. The codes documented in this report are preliminary and upon coder review may  be revised to meet current compliance requirements. Andrey Farmer MD, MD 06/19/2021 8:40:47 AM Number of Addenda: 0 Note Initiated On: 06/19/2021 8:25 AM Estimated Blood Loss:  Estimated blood loss was minimal.      [facility]

## 2021-06-19 NOTE — Interval H&P Note (Signed)
History and Physical Interval Note:  06/19/2021 8:23 AM  Justin Patel  has presented today for surgery, with the diagnosis of Melena Hx gastric bypass LUQ abd pain.  The various methods of treatment have been discussed with the patient and family. After consideration of risks, benefits and other options for treatment, the patient has consented to  Procedure(s): ESOPHAGOGASTRODUODENOSCOPY (EGD) WITH PROPOFOL (N/A) as a surgical intervention.  The patient's history has been reviewed, patient examined, no change in status, stable for surgery.  I have reviewed the patient's chart and labs.  Questions were answered to the patient's satisfaction.     [name]  Ok to proceed with EGD

## 2021-06-19 NOTE — Transfer of Care (Signed)
Immediate Anesthesia Transfer of Care Note  Patient: Justin Patel  Procedure(s) Performed: ESOPHAGOGASTRODUODENOSCOPY (EGD) WITH PROPOFOL  Patient Location: PACU  Anesthesia Type:General  Level of Consciousness: awake, alert  and oriented  Airway & Oxygen Therapy: Patient Spontanous Breathing  Post-op Assessment: Report given to RN and Post -op Vital signs reviewed and stable  Post vital signs: Reviewed and stable  Last Vitals:  Vitals Value Taken Time  BP 100/61 06/19/21 0840  Temp    Pulse 83 06/19/21 0842  Resp 23 06/19/21 0842  SpO2 100 % 06/19/21 0842  Vitals shown include unvalidated device data.  Last Pain:  Vitals:   06/19/21 0830  TempSrc: Temporal  PainSc:          Complications: No notable events documented.

## 2021-06-19 NOTE — Anesthesia Postprocedure Evaluation (Signed)
Anesthesia Post Note  Patient: Justin Patel  Procedure(s) Performed: ESOPHAGOGASTRODUODENOSCOPY (EGD) WITH PROPOFOL  Patient location during evaluation: PACU Anesthesia Type: General Level of consciousness: awake and awake and alert Pain management: pain level controlled Vital Signs Assessment: post-procedure vital signs reviewed and stable Respiratory status: spontaneous breathing and respiratory function stable Cardiovascular status: blood pressure returned to baseline Anesthetic complications: no   No notable events documented.   Last Vitals:  Vitals:   06/19/21 0900 06/19/21 0910  BP: (!) 144/130 (!) 154/82  Pulse: 84 84  Resp: (!) 24 (!) 24  Temp:    SpO2: 98% 98%    Last Pain:  Vitals:   06/19/21 0830  TempSrc: Temporal  PainSc:                  VAN STAVEREN,Alzora Ha

## 2021-06-19 NOTE — H&P (Signed)
Outpatient short stay form Pre-procedure 06/19/2021  Justin Rubenstein, MD  Primary Physician: [name], MD  Reason for visit:  Melena  History of present illness:   70 y/o gentleman with history of roux-en-y gastric bypass here for EGD for history of melena that has since resolved. Also with history of cholecystectomy and appendectomy. No blood thinners. No family history of GI malignancies. Has had loose stool since gallbladder removed about one year ago.    Current Facility-Administered Medications:    0.9 %  sodium chloride infusion, , Intravenous, Continuous, Shawnae Leiva, Hilton Cork, MD, Last Rate: 20 mL/hr at 06/19/21 0809, 1,000 mL at 06/19/21 0809  Medications Prior to Admission  Medication Sig Dispense Refill Last Dose   amLODipine (NORVASC) 10 MG tablet TAKE ONE TABLET EVERY DAY 90 tablet 1 06/18/2021   blood glucose meter kit and supplies KIT Dispense based on patient and insurance preference. Check once daily. ICD doing E11.9. 1 each 0 06/18/2021   Calcium Carbonate-Vitamin D (CALCIUM 600/VITAMIN D PO) Take 1 tablet by mouth daily.   06/18/2021   cyclobenzaprine (FLEXERIL) 5 MG tablet Take 5-10 mg by mouth every 8 (eight) hours as needed.   Past Week   esomeprazole (NEXIUM) 40 MG capsule TAKE 1 CAPSULE BY MOUTH ONCE DAILY (Patient taking differently: in the morning and at bedtime. TAKE 1 CAPSULE BY MOUTH ONCE DAILY) 90 capsule 1 06/18/2021   ezetimibe (ZETIA) 10 MG tablet Take 1 tablet (10 mg total) by mouth daily. 90 tablet 1 06/18/2021   furosemide (LASIX) 20 MG tablet TAKE ONE TABLET BY MOUTH EVERY DAY 90 tablet 1 06/18/2021   gabapentin (NEURONTIN) 300 MG capsule Take by mouth.   06/18/2021   metoprolol succinate (TOPROL-XL) 50 MG 24 hr tablet TAKE 1 TABLET BY MOUTH DAILY WITH OR IMMEDIATLY FOLLOWING A MEAL 90 tablet 1 06/18/2021   Multiple Vitamin (MULTIVITAMIN) capsule Take 1 capsule by mouth daily. With copper and zinc   06/18/2021   primidone (MYSOLINE) 50 MG  tablet TAKE 1 TABLET BY MOUTH AT BEDTIME 30 tablet 1 06/18/2021   rosuvastatin (CRESTOR) 40 MG tablet TAKE ONE TABLET EVERY DAY 90 tablet 3 06/18/2021   sucralfate (CARAFATE) 1 g tablet TAKE 1 TABLET BY MOUTH 4 TIMES DAILY 360 tablet 1 06/18/2021   tamsulosin (FLOMAX) 0.4 MG CAPS capsule TAKE 1 CAPSULE BY MOUTH EVERY DAY 90 capsule 3 06/18/2021   TRADJENTA 5 MG TABS tablet TAKE 1 TABLET BY MOUTH DAILY 90 tablet 1 06/18/2021   traMADol (ULTRAM) 50 MG tablet Take 50 mg by mouth every 6 (six) hours as needed.   Past Week   venlafaxine XR (EFFEXOR-XR) 75 MG 24 hr capsule TAKE ONE CAPSULE EVERY MORNING WITH BREAKFAST 90 capsule 1 06/18/2021   metroNIDAZOLE (METROGEL) 0.75 % gel Apply 1 application topically 2 (two) times daily. For rosacea - apply to the cheeks, chin, and nose BID. 45 g 3    nystatin cream (MYCOSTATIN) Apply 1 application topically 2 (two) times daily. (Patient not taking: Reported on 06/17/2021) 30 g 0    nystatin-triamcinolone ointment (MYCOLOG) Apply 1 application topically 2 (two) times daily. (Patient not taking: Reported on 06/17/2021) 30 g 0      Allergies  Allergen Reactions   Altace [Ramipril] Anaphylaxis   Levaquin [Levofloxacin In D5w] Hives   Ace Inhibitors Hives   Iodine Hives   Levofloxacin    Conray [Iothalamate] Hives    HIVES, he had this reaction to Ionic contrast in 1974 for an IVP. SPM  Lyrica [Pregabalin] Other (See Comments)    Edema   Metformin Diarrhea    High doses cause diarrhea.    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  removal scheduled to clear margins.   CHF (congestive heart failure) (HCC)    no longer after weight loss   Chronic kidney disease    cysts   Degenerative disc disease    Depression    Diabetes mellitus    no longer  diabetic-or on meds   DJD (degenerative joint disease)    Dysplastic nevus 02/04/2016   R upper back paraspinal - severe   Family history of adverse reaction to anesthesia    sons wake up combative   Gastric ulcer    GERD (gastroesophageal reflux disease)    H/O hiatal hernia    Headache(784.0)    sinus   Heart murmur    Hx of congestive heart failure    Hyperlipidemia    Hypertension    Iron deficiency anemia 02/28/2015   Lumbar scoliosis    Opiate abuse, continuous ([place]) 06/20/2015   Sacral fracture, closed ([place])    Seizures ([place])    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, after GI bleed   Sleep apnea    hx not now since wt loss   Stones in the urinary tract    Syncope and collapse     Review of systems:  Otherwise negative.    Physical Exam  Gen: Alert, oriented. Appears stated age.  HEENT: PERRLA. Lungs: No respiratory distress CV: RRR Abd: soft, benign, no masses Ext: No edema    Planned procedures: Proceed with EGD. The patient understands the nature of the planned procedure, indications, risks, alternatives and potential complications including but not limited to bleeding, infection, perforation, damage to internal organs and possible oversedation/side effects from anesthesia. The patient agrees and gives consent to proceed.  Please refer to procedure notes for findings, recommendations and patient disposition/instructions.     Justin Rubenstein, MD [facility] Gastroenterology

## 2021-06-19 NOTE — Anesthesia Preprocedure Evaluation (Signed)
Anesthesia Evaluation  Patient identified by MRN, date of birth, ID band Patient awake    Reviewed: Allergy & Precautions, NPO status , Patient's Chart, lab work & pertinent test results  Airway Mallampati: II  TM Distance: >3 FB Neck ROM: full    Dental  (+) Teeth Intact   Pulmonary neg pulmonary ROS, sleep apnea ,    Pulmonary exam normal  + decreased breath sounds      Cardiovascular hypertension, negative cardio ROS Normal cardiovascular exam Rhythm:Regular Rate:Normal     Neuro/Psych  Headaches, Anxiety Depression negative neurological ROS  negative psych ROS   GI/Hepatic negative GI ROS, Neg liver ROS,   Endo/Other  negative endocrine ROSdiabetes  Renal/GU negative Renal ROS  negative genitourinary   Musculoskeletal   Abdominal (+) + obese,   Peds  Hematology negative hematology ROS (+)   Anesthesia Other Findings Past Medical History: No date: Anemia     Comment:  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                removal scheduled to clear margins. No date: CHF (congestive heart failure) (HCC)     Comment:  no longer after weight loss No date: Chronic kidney disease     Comment:  cysts No date: Degenerative disc disease No date: Depression No date: Diabetes mellitus     Comment:  no longer diabetic-or on meds No date: DJD (degenerative joint disease) 02/04/2016: Dysplastic nevus     Comment:  R upper back paraspinal - severe No date: Family history of adverse reaction to anesthesia     Comment:  sons wake up combative No date: Gastric ulcer No date: GERD (gastroesophageal reflux disease) No date: H/O hiatal hernia No date: Headache(784.0)     Comment:  sinus No date: Heart murmur No date: Hx of congestive heart failure No date: Hyperlipidemia No date:  Hypertension 02/28/2015: Iron deficiency anemia No date: Lumbar scoliosis 06/20/2015: Opiate abuse, continuous (HCC) No date: Sacral fracture, closed ([place]) No date: Seizures ([place])     Comment:  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, after GI bleed No date: Sleep apnea     Comment:  hx not now since wt loss No date: Stones in the urinary tract No date: Syncope and collapse  Past Surgical History: 01/26/2012: ANTERIOR CERVICAL DECOMP/DISCECTOMY FUSION     Comment:  Procedure: ANTERIOR CERVICAL DECOMPRESSION/DISCECTOMY               FUSION 3 LEVELS;  Surgeon: Floyce Stakes, MD;                Location: [facility];  Service: Neurosurgery;                Laterality: N/A;  Cervical three-four Cervical four-five               Cervical five-six Cervical six-seven , Anterior cervical               decompression/diskectomy, fusion, plate 04/25/2018: ANTERIOR LAT LUMBAR FUSION; Right     Comment:  Procedure: Right Lumbar two-three Lumbar three-four               Lumbar four-five anterior  lateral interbody fusion  with              posterior percutaneous pedicle screws;  Surgeon: Vertell Limber,  Broadus John, MD;  Location: [place];  Service: Neurosurgery;                Laterality: Right; No date: APPENDECTOMY No date: CARDIAC CATHETERIZATION     Comment:  [facility], normal No date: CARDIAC CATHETERIZATION     Comment:  [place] No date: CHOLECYSTECTOMY 12/27/2017: COLONOSCOPY WITH PROPOFOL; N/A     Comment:  Procedure: COLONOSCOPY WITH PROPOFOL;  Surgeon: [place],               Benay Pike, MD;  Location: [facility] ENDOSCOPY;  Service:               Gastroenterology;  Laterality: N/A; 12/27/2017: ESOPHAGOGASTRODUODENOSCOPY (EGD) WITH PROPOFOL; N/A     Comment:  Procedure: ESOPHAGOGASTRODUODENOSCOPY (EGD) WITH               PROPOFOL;  Surgeon: Toledo, Benay Pike, MD;  Location:               [facility] ENDOSCOPY;  Service: Gastroenterology;  Laterality:               N/A; 08/09/2008:  GASTRIC BYPASS     Comment:  [place] 08/09/1998: HERNIA REPAIR     Comment:  hiatal No date: JOINT REPLACEMENT No date: KNEE ARTHROSCOPY     Comment:  bilateral, left x 2 01/14/2020: LAMINECTOMY WITH POSTERIOR LATERAL ARTHRODESIS LEVEL 4; N/ A     Comment:  Procedure: Decompressive Laminectomy Lumbar One with               pedicle screw fixation from Thoracic Ten to Lumbar Two;                Surgeon: Erline Levine, MD;  Location: [place];  Service:               Neurosurgery;  Laterality: N/A;  Decompressive               Laminectomy Lumbar One with pedicle screw fixation from               Thoracic Ten to Lumbar Two 04/25/2018: LUMBAR PERCUTANEOUS PEDICLE SCREW 3 LEVEL; N/A     Comment:  Procedure: LUMBAR PERCUTANEOUS PEDICLE SCREW 3 LEVEL;                Surgeon: Erline Levine, MD;  Location: [place];  Service:               Neurosurgery;  Laterality: N/A; No date: NASAL SINUS SURGERY     Comment:  x5 08/10/1999: OSTEOTOMY     Comment:  left No date: TONSILLECTOMY 05/09/2014: TOTAL KNEE ARTHROPLASTY; Left     Comment:  Dr. Marry Guan 08/09/2009: UVULOPALATOPLASTY  BMI    Body Mass Index: 30.51 kg/m      Reproductive/Obstetrics negative OB ROS                             Anesthesia Physical Anesthesia Plan  ASA: 3  Anesthesia Plan: General   Post-op Pain Management:    Induction: Intravenous  PONV Risk Score and Plan: Propofol infusion and TIVA  Airway Management Planned: Nasal Cannula  Additional Equipment:   Intra-op Plan:   Post-operative Plan:   Informed Consent: I have reviewed the patients History and Physical, chart, labs and discussed the procedure including the risks, benefits and alternatives for the proposed anesthesia with the patient or authorized representative who has indicated his/her understanding and  acceptance.     Dental Advisory Given  Plan Discussed with: CRNA and Surgeon  Anesthesia Plan Comments:          Anesthesia Quick Evaluation

## 2021-06-22 ENCOUNTER — Ambulatory Visit (INDEPENDENT_AMBULATORY_CARE_PROVIDER_SITE_OTHER): Payer: Medicare PPO

## 2021-06-22 ENCOUNTER — Encounter: Payer: Self-pay | Admitting: Gastroenterology

## 2021-06-22 VITALS — Ht 66.0 in | Wt 189.0 lb

## 2021-06-22 DIAGNOSIS — K289 Gastrojejunal ulcer, unspecified as acute or chronic, without hemorrhage or perforation: Secondary | ICD-10-CM | POA: Diagnosis not present

## 2021-06-22 DIAGNOSIS — Z0000 Encounter for general adult medical examination without abnormal findings: Secondary | ICD-10-CM

## 2021-06-22 DIAGNOSIS — Z980 Intestinal bypass and anastomosis status: Secondary | ICD-10-CM | POA: Diagnosis not present

## 2021-06-22 LAB — SURGICAL PATHOLOGY

## 2021-06-22 NOTE — Patient Instructions (Addendum)
Justin Patel , Thank you for taking time to come for your Medicare Wellness Visit. I appreciate your ongoing commitment to your health goals. Please review the following plan we discussed and let me know if I can assist you in the future.   These are the goals we discussed:  Goals      Increase physical activity     As tolerated        This is a list of the screening recommended for you and due dates:  Health Maintenance  Topic Date Due   COVID-19 Vaccine (3 - Booster for Pfizer series) 07/08/2021*   Eye exam for diabetics  08/08/2021*   Hemoglobin A1C  10/06/2021   Complete foot exam   01/02/2022   Urine Protein Check  04/06/2022   Tetanus Vaccine  06/03/2024   Colon Cancer Screening  12/28/2027   Pneumonia Vaccine  Completed   Flu Shot  Completed   Hepatitis C Screening: USPSTF Recommendation to screen - Ages 18-79 yo.  Completed   Zoster (Shingles) Vaccine  Completed   HPV Vaccine  Aged Out  *Topic was postponed. The date shown is not the original due date.    Advanced directives: End of life planning; Advance aging; Advanced directives discussed.  Copy of current HCPOA/Living Will requested.    Conditions/risks identified: none new  Follow up in one year for your annual wellness visit.   Preventive Care 24 Years and Older, Male Preventive care refers to lifestyle choices and visits with your health care provider that can promote health and wellness. What does preventive care include? A yearly physical exam. This is also called an annual well check. Dental exams once or twice a year. Routine eye exams. Ask your health care provider how often you should have your eyes checked. Personal lifestyle choices, including: Daily care of your teeth and gums. Regular physical activity. Eating a healthy diet. Avoiding tobacco and drug use. Limiting alcohol use. Practicing safe sex. Taking low doses of aspirin every day. Taking vitamin and mineral supplements as recommended by  your health care provider. What happens during an annual well check? The services and screenings done by your health care provider during your annual well check will depend on your age, overall health, lifestyle risk factors, and family history of disease. Counseling  Your health care provider may ask you questions about your: Alcohol use. Tobacco use. Drug use. Emotional well-being. Home and relationship well-being. Sexual activity. Eating habits. History of falls. Memory and ability to understand (cognition). Work and work Statistician. Screening  You may have the following tests or measurements: Height, weight, and BMI. Blood pressure. Lipid and cholesterol levels. These may be checked every 5 years, or more frequently if you are over 68 years old. Skin check. Lung cancer screening. You may have this screening every year starting at age 44 if you have a 30-pack-year history of smoking and currently smoke or have quit within the past 15 years. Fecal occult blood test (FOBT) of the stool. You may have this test every year starting at age 37. Flexible sigmoidoscopy or colonoscopy. You may have a sigmoidoscopy every 5 years or a colonoscopy every 10 years starting at age 3. Prostate cancer screening. Recommendations will vary depending on your family history and other risks. Hepatitis C blood test. Hepatitis B blood test. Sexually transmitted disease (STD) testing. Diabetes screening. This is done by checking your blood sugar (glucose) after you have not eaten for a while (fasting). You may have this done  every 1-3 years. Abdominal aortic aneurysm (AAA) screening. You may need this if you are a current or former smoker. Osteoporosis. You may be screened starting at age 87 if you are at high risk. Talk with your health care provider about your test results, treatment options, and if necessary, the need for more tests. Vaccines  Your health care provider may recommend certain vaccines,  such as: Influenza vaccine. This is recommended every year. Tetanus, diphtheria, and acellular pertussis (Tdap, Td) vaccine. You may need a Td booster every 10 years. Zoster vaccine. You may need this after age 92. Pneumococcal 13-valent conjugate (PCV13) vaccine. One dose is recommended after age 34. Pneumococcal polysaccharide (PPSV23) vaccine. One dose is recommended after age 28. Talk to your health care provider about which screenings and vaccines you need and how often you need them. This information is not intended to replace advice given to you by your health care provider. Make sure you discuss any questions you have with your health care provider. Document Released: 08/22/2015 Document Revised: 04/14/2016 Document Reviewed: 05/27/2015 Elsevier Interactive Patient Education  2017 [place] Prevention in the Home Falls can cause injuries. They can happen to people of all ages. There are many things you can do to make your home safe and to help prevent falls. What can I do on the outside of my home? Regularly fix the edges of walkways and driveways and fix any cracks. Remove anything that might make you trip as you walk through a door, such as a raised step or threshold. Trim any bushes or trees on the path to your home. Use bright outdoor lighting. Clear any walking paths of anything that might make someone trip, such as rocks or tools. Regularly check to see if handrails are loose or broken. Make sure that both sides of any steps have handrails. Any raised decks and porches should have guardrails on the edges. Have any leaves, snow, or ice cleared regularly. Use sand or salt on walking paths during winter. Clean up any spills in your garage right away. This includes oil or grease spills. What can I do in the bathroom? Use night lights. Install grab bars by the toilet and in the tub and shower. Do not use towel bars as grab bars. Use non-skid mats or decals in the tub or  shower. If you need to sit down in the shower, use a plastic, non-slip stool. Keep the floor dry. Clean up any water that spills on the floor as soon as it happens. Remove soap buildup in the tub or shower regularly. Attach bath mats securely with double-sided non-slip rug tape. Do not have throw rugs and other things on the floor that can make you trip. What can I do in the bedroom? Use night lights. Make sure that you have a light by your bed that is easy to reach. Do not use any sheets or blankets that are too big for your bed. They should not hang down onto the floor. Have a firm chair that has side arms. You can use this for support while you get dressed. Do not have throw rugs and other things on the floor that can make you trip. What can I do in the kitchen? Clean up any spills right away. Avoid walking on wet floors. Keep items that you use a lot in easy-to-reach places. If you need to reach something above you, use a strong step stool that has a grab bar. Keep electrical cords out of the  way. Do not use floor polish or wax that makes floors slippery. If you must use wax, use non-skid floor wax. Do not have throw rugs and other things on the floor that can make you trip. What can I do with my stairs? Do not leave any items on the stairs. Make sure that there are handrails on both sides of the stairs and use them. Fix handrails that are broken or loose. Make sure that handrails are as long as the stairways. Check any carpeting to make sure that it is firmly attached to the stairs. Fix any carpet that is loose or worn. Avoid having throw rugs at the top or bottom of the stairs. If you do have throw rugs, attach them to the floor with carpet tape. Make sure that you have a light switch at the top of the stairs and the bottom of the stairs. If you do not have them, ask someone to add them for you. What else can I do to help prevent falls? Wear shoes that: Do not have high heels. Have  rubber bottoms. Are comfortable and fit you well. Are closed at the toe. Do not wear sandals. If you use a stepladder: Make sure that it is fully opened. Do not climb a closed stepladder. Make sure that both sides of the stepladder are locked into place. Ask someone to hold it for you, if possible. Clearly mark and make sure that you can see: Any grab bars or handrails. First and last steps. Where the edge of each step is. Use tools that help you move around (mobility aids) if they are needed. These include: Canes. Walkers. Scooters. Crutches. Turn on the lights when you go into a dark area. Replace any light bulbs as soon as they burn out. Set up your furniture so you have a clear path. Avoid moving your furniture around. If any of your floors are uneven, fix them. If there are any pets around you, be aware of where they are. Review your medicines with your doctor. Some medicines can make you feel dizzy. This can increase your chance of falling. Ask your doctor what other things that you can do to help prevent falls. This information is not intended to replace advice given to you by your health care provider. Make sure you discuss any questions you have with your health care provider. Document Released: 05/22/2009 Document Revised: 01/01/2016 Document Reviewed: 08/30/2014 Elsevier Interactive Patient Education  2017 [employer].

## 2021-06-22 NOTE — Progress Notes (Signed)
Subjective:   Justin Patel is a 70 y.o. male who presents for Medicare Annual/Subsequent preventive examination.  Review of Systems    No ROS.  Medicare Wellness Virtual Visit.  Visual/audio telehealth visit, UTA vital signs.   See social history for additional risk factors.   Cardiac Risk Factors include: advanced age (>2mn, >>28women);male gender;diabetes mellitus     Objective:    Today's Vitals   06/22/21 1451  Weight: 189 lb (85.7 kg)  Height: _0  (1.676 m)   Body mass index is 30.51 kg/m.  Advanced Directives 06/22/2021 06/19/2021 06/06/2021 10/16/2020 06/19/2020 01/29/2020 01/28/2020  Does Patient Have a Medical Advance Directive? Yes Yes No Yes Yes No No  Type of AParamed[place]of [place] will [place]Living will - [place] will [place] will - -  Does patient want to make changes to medical advance directive? No - Guardian declined - - - No - Patient declined - -  Copy of [place] Chart? No - copy requested No - copy requested - - No - copy requested - -  Would patient like information on creating a medical advance directive? - - - - - No - Patient declined No - Patient declined  Pre-existing out of facility DNR order (yellow form or pink MOST form) - - - - - - -    Current Medications (verified) Outpatient Encounter Medications as of 06/22/2021  Medication Sig   amLODipine (NORVASC) 10 MG tablet TAKE ONE TABLET EVERY DAY   blood glucose meter kit and supplies KIT Dispense based on patient and insurance preference. Check once daily. ICD doing E11.9.   Calcium Carbonate-Vitamin D (CALCIUM 600/VITAMIN D PO) Take 1 tablet by mouth daily.   cyclobenzaprine (FLEXERIL) 5 MG tablet Take 5-10 mg by mouth every 8 (eight) hours as needed.   esomeprazole (NEXIUM) 40 MG capsule TAKE 1 CAPSULE BY MOUTH ONCE DAILY (Patient taking differently: in the  morning and at bedtime. TAKE 1 CAPSULE BY MOUTH ONCE DAILY)   ezetimibe (ZETIA) 10 MG tablet Take 1 tablet (10 mg total) by mouth daily.   furosemide (LASIX) 20 MG tablet TAKE ONE TABLET BY MOUTH EVERY DAY   gabapentin (NEURONTIN) 300 MG capsule Take by mouth.   metoprolol succinate (TOPROL-XL) 50 MG 24 hr tablet TAKE 1 TABLET BY MOUTH DAILY WITH OR IMMEDIATLY FOLLOWING A MEAL   metroNIDAZOLE (METROGEL) 0.75 % gel Apply 1 application topically 2 (two) times daily. For rosacea - apply to the cheeks, chin, and nose BID.   Multiple Vitamin (MULTIVITAMIN) capsule Take 1 capsule by mouth daily. With copper and zinc   nystatin cream (MYCOSTATIN) Apply 1 application topically 2 (two) times daily. (Patient not taking: Reported on 06/17/2021)   nystatin-triamcinolone ointment (MYCOLOG) Apply 1 application topically 2 (two) times daily. (Patient not taking: Reported on 06/17/2021)   primidone (MYSOLINE) 50 MG tablet TAKE 1 TABLET BY MOUTH AT BEDTIME   rosuvastatin (CRESTOR) 40 MG tablet TAKE ONE TABLET EVERY DAY   sucralfate (CARAFATE) 1 g tablet TAKE 1 TABLET BY MOUTH 4 TIMES DAILY   tamsulosin (FLOMAX) 0.4 MG CAPS capsule TAKE 1 CAPSULE BY MOUTH EVERY DAY   TRADJENTA 5 MG TABS tablet TAKE 1 TABLET BY MOUTH DAILY   traMADol (ULTRAM) 50 MG tablet Take 50 mg by mouth every 6 (six) hours as needed.   venlafaxine XR (EFFEXOR-XR) 75 MG 24 hr capsule TAKE ONE CAPSULE EVERY MORNING WITH  BREAKFAST   No facility-administered encounter medications on file as of 06/22/2021.    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  removal scheduled to clear margins.    CHF (congestive heart failure) (HCC)    no longer after weight loss   Chronic kidney disease    cysts   Degenerative disc disease    Depression    Diabetes mellitus    no longer diabetic-or on meds   DJD (degenerative joint disease)    Dysplastic nevus 02/04/2016   R upper back paraspinal - severe   Family history of adverse reaction to anesthesia    sons wake up combative   Gastric ulcer    GERD (gastroesophageal reflux disease)    H/O hiatal hernia    Headache(784.0)    sinus   Heart murmur    Hx of congestive heart failure    Hyperlipidemia    Hypertension    Iron deficiency anemia 02/28/2015   Lumbar scoliosis    Opiate abuse, continuous ([place]) 06/20/2015   Sacral fracture, closed ([place])    Seizures ([place])    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, after GI bleed   Sleep apnea    hx not now since wt loss   Stones in the urinary tract    Syncope and collapse    Past Surgical History:  Procedure Laterality Date   ANTERIOR CERVICAL DECOMP/DISCECTOMY FUSION  01/26/2012   Procedure: ANTERIOR CERVICAL DECOMPRESSION/DISCECTOMY FUSION 3 LEVELS;  Surgeon: Floyce Stakes, MD;  Location: [facility];  Service: Neurosurgery;  Laterality: N/A;  Cervical three-four Cervical four-five Cervical five-six Cervical six-seven , Anterior cervical decompression/diskectomy, fusion, plate   ANTERIOR LAT LUMBAR FUSION Right 04/25/2018   Procedure: Right Lumbar two-three Lumbar three-four Lumbar four-five anterior  lateral interbody fusion  with posterior percutaneous pedicle screws;  Surgeon: Erline Levine, MD;  Location: [facility];  Service: Neurosurgery;  Laterality: Right;   [place], normal   CARDIAC CATHETERIZATION     [place]     COLONOSCOPY WITH PROPOFOL N/A 12/27/2017   Procedure: COLONOSCOPY WITH PROPOFOL;  Surgeon: Toledo, Benay Pike, MD;  Location: [facility];  Service: Gastroenterology;  Laterality: N/A;    ESOPHAGOGASTRODUODENOSCOPY (EGD) WITH PROPOFOL N/A 12/27/2017   Procedure: ESOPHAGOGASTRODUODENOSCOPY (EGD) WITH PROPOFOL;  Surgeon: Toledo, Benay Pike, MD;  Location: [facility];  Service: Gastroenterology;  Laterality: N/A;   ESOPHAGOGASTRODUODENOSCOPY (EGD) WITH PROPOFOL N/A 06/19/2021   Procedure: ESOPHAGOGASTRODUODENOSCOPY (EGD) WITH PROPOFOL;  Surgeon: [name], MD;  Location: [facility];  Service: Gastroenterology;  Laterality: N/A;   GASTRIC BYPASS  08/09/2008   [employer]   HERNIA REPAIR  08/09/1998   hiatal   JOINT REPLACEMENT     KNEE ARTHROSCOPY     bilateral, left x 2   LAMINECTOMY WITH POSTERIOR LATERAL ARTHRODESIS LEVEL 4 N/A 01/14/2020   Procedure: Decompressive Laminectomy Lumbar One with pedicle screw fixation from Thoracic Ten to Lumbar Two;  Surgeon: Erline Levine, MD;  Location: [place];  Service: Neurosurgery;  Laterality: N/A;  Decompressive Laminectomy Lumbar One with pedicle screw fixation from Thoracic Ten to Lumbar  Two   LUMBAR PERCUTANEOUS PEDICLE SCREW 3 LEVEL N/A 04/25/2018   Procedure: LUMBAR PERCUTANEOUS PEDICLE SCREW 3 LEVEL;  Surgeon: Erline Levine, MD;  Location: [place];  Service: Neurosurgery;  Laterality: N/A;   NASAL SINUS SURGERY     x5   OSTEOTOMY  08/10/1999   left   TONSILLECTOMY     TOTAL KNEE ARTHROPLASTY Left 05/09/2014   Dr. Marry Guan   UVULOPALATOPLASTY  08/09/2009   Family History  Problem Relation Age of Onset   Dementia Mother 6   Osteoporosis Mother    Heart disease Father 62   Diabetes Father    Lymphoma Sister        lymphoma, stage 4   Ovarian cancer Sister 73       Ovarian   Lupus Sister    Prostate cancer Maternal Uncle    Skin cancer Maternal Uncle    Tongue cancer Maternal Uncle        uncle died of heart attack   Alcoholism Maternal Uncle    Lung cancer Paternal Aunt        pat aunts x 4 died of lung cancer   Lung cancer Paternal Aunt    Lung cancer Paternal Aunt    Lung cancer Paternal 32     Mesothelioma Cousin        paternal cousin   Social History   Socioeconomic History   Marital status: Married    Spouse name: Not on file   Number of children: 2   Years of education: Not on file   Highest education level: Not on file  Occupational History   Not on file  Tobacco Use   Smoking status: Never   Smokeless tobacco: Never  Vaping Use   Vaping Use: Never used  Substance and Sexual Activity   Alcohol use: No   Drug use: No   Sexual activity: Yes    Partners: Female  Other Topics Concern   Not on file  Social History Narrative   Lives in [place] with wife, [place]. 2 sons, William Hamburger, Lennette Bihari 65.. 4 grandchildren      Work - Retired, previously Nurse, learning disability in [place] - regular diet, limited quantities after gastric bypass      Exercise - no regular, limited by arthritis in knees, occasional water aerobics   Right handed   One story house   Social Determinants of Health   Financial Resource Strain: Low Risk    Difficulty of Paying Living Expenses: Not hard at all  Food Insecurity: No Food Insecurity   Worried About Charity fundraiser in the Last Year: Never true   Arboriculturist in the Last Year: Never true  Transportation Needs: No Transportation Needs   Lack of Transportation (Medical): No   Lack of Transportation (Non-Medical): No  Physical Activity: Unknown   Days of Exercise per Week: 0 days   Minutes of Exercise per Session: Not on file  Stress: No Stress Concern Present   Feeling of Stress : Not at all  Social Connections: Unknown   Frequency of Communication with Friends and Family: Not on file   Frequency of Social Gatherings with Friends and Family: More than three times a week   Attends Religious Services: Not on file   Active Member of Clubs or Organizations: Not on file   Attends Archivist Meetings: Not on file   Marital Status: Married    Tobacco Counseling  Counseling given: Not  Answered   Clinical Intake:  Pre-visit preparation completed: Yes        Diabetes: Yes (Followed by pcp)  Nutrition Risk Assessment: Does the patient have any non-healing wounds?  No  Has the patient had any unintentional weight loss or weight gain?  No   Diabetes: How often do you monitor your CBG's? Once weekly.   Financial Strains and Diabetes Management: Are you having any financial strains with the device, your supplies or your medication? No .  Does the patient want to be seen by Chronic Care Management for management of their diabetes?  No  Would the patient like to be referred to a Nutritionist or for Diabetic Management?  No     How often do you need to have someone help you when you read instructions, pamphlets, or other written materials from your doctor or pharmacy?: 1 - Never    Interpreter Needed?: No    Activities of Daily Living In your present state of health, do you have any difficulty performing the following activities: 06/22/2021  Hearing? Y  Comment Hearing aids  Vision? N  Difficulty concentrating or making decisions? N  Walking or climbing stairs? Y  Comment Cane in use as needed  Dressing or bathing? N  Doing errands, shopping? N  Preparing Food and eating ? N  Using the Toilet? N  In the past six months, have you accidently leaked urine? N  Do you have problems with loss of bowel control? N  Managing your Medications? N  Managing your Finances? N  Housekeeping or managing your Housekeeping? N  Some recent data might be hidden    Patient Care Team: [name], MD as PCP - General (Family Medicine) Pieter Partridge, DO as Consulting Physician (Neurology)  Indicate any recent Medical Services you may have received from other than [facility] providers in the past year (date may be approximate).     Assessment:   This is a routine wellness examination for [place].  Virtual Visit via Telephone Note I connected with  Justin Patel on  06/22/21 at  2:45 PM EST by telephone and verified that I am speaking with the correct person using two identifiers.  Location: Patient: home Provider: office Persons participating in the virtual visit: patient/Nurse Health Advisor   I discussed the limitations, risks, security and privacy concerns of performing an evaluation and management service by telephone and the availability of in person appointments. The patient expressed understanding and agreed to proceed.  Interactive audio and video telecommunications were attempted between this nurse and patient, however failed, due to patient having technical difficulties OR patient did not have access to video capability.  We continued and completed visit with audio only.  Some vital signs may be absent or patient reported.   Hearing/Vision screen Hearing Screening - Comments:: Hearing aids Vision Screening - Comments:: Followed by [facility] Wears corrective lenses  They have regular follow up with the ophthalmologist  Dietary issues and exercise activities discussed:   Diabetic diet Good water intake    Goals Addressed             This Visit's Progress    Increase physical activity       As tolerated       Depression Screen PHQ 2/9 Scores 06/22/2021 04/06/2021 09/25/2020 09/25/2020 06/19/2020 05/20/2020 02/18/2020  PHQ - 2 Score 0 0 0 0 0 0 0  PHQ- 9 Score - - - - - - -  Exception Documentation - - - - - - -    Fall Risk Fall Risk  06/22/2021 04/06/2021 10/16/2020 09/25/2020 06/19/2020  Falls in the past year? - 1 0 0 0  Comment - - - - -  Number falls in past yr: - 0 0 0 0  Injury with Fall? - 0 0 - 0  Comment - - - - -  Risk Factor Category  - - - - -  Risk for fall due to : - - - - -  Follow up Falls evaluation completed Falls evaluation completed - Falls evaluation completed Falls evaluation completed  Comment - - - - -    FALL RISK PREVENTION PERTAINING TO THE HOME: Home free of loose throw rugs in  walkways, pet beds, electrical cords, etc? Yes  Adequate lighting in your home to reduce risk of falls? Yes   ASSISTIVE DEVICES UTILIZED TO PREVENT FALLS: Life alert? No  Use of a cane, walker or w/c? Yes , cane as needed Grab bars in the bathroom? Yes  Shower chair or bench in shower? Yes   TIMED UP AND GO: Was the test performed? No .   Cognitive Function: Patient is alert and oriented x3.  MMSE - Mini Mental State Exam 03/31/2018 03/30/2017  Orientation to time 5 5  Orientation to Place 5 5  Registration 3 3  Attention/ Calculation 5 5  Recall 3 3  Language- name 2 objects 2 2  Language- repeat 1 1  Language- follow 3 step command 3 3  Language- read & follow direction 1 1  Write a sentence 1 1  Copy design 1 1  Total score 30 30     6CIT Screen 04/02/2019  What Year? 0 points  What month? 0 points  What time? 0 points  Count back from 20 0 points  Months in reverse 0 points  Repeat phrase 0 points  Total Score 0    Immunizations Immunization History  Administered Date(s) Administered   Fluad Quad(high Dose 65+) 05/01/2019, 05/20/2020, 04/06/2021   Influenza, High Dose Seasonal PF 06/25/2016, 05/08/2018   Influenza,inj,Quad PF,6+ Mos 07/10/2013, 04/05/2017   Influenza-Unspecified 05/21/2012, 05/29/2014, 04/10/2015, 04/06/2017   PFIZER(Purple Top)SARS-COV-2 Vaccination 10/05/2019, 10/30/2019   Pneumococcal Conjugate-13 06/13/2014   Pneumococcal Polysaccharide-23 03/21/2010, 04/05/2017   Tdap 06/03/2014   Zoster Recombinat (Shingrix) 04/08/2021, 06/09/2021   Zoster, Live 05/10/2013   Eye exam- scheduled in 07/2021.   Screening Tests Health Maintenance  Topic Date Due   COVID-19 Vaccine (3 - Booster for Pfizer series) 07/08/2021 (Originally 12/25/2019)   OPHTHALMOLOGY EXAM  08/08/2021 (Originally 04/10/2020)   HEMOGLOBIN A1C  10/06/2021   FOOT EXAM  01/02/2022   URINE MICROALBUMIN  04/06/2022   TETANUS/TDAP  06/03/2024   COLONOSCOPY (Pts 45-45yr Insurance  coverage will need to be confirmed)  12/28/2027   Pneumonia Vaccine 70 Years old  Completed   INFLUENZA VACCINE  Completed   Hepatitis C Screening  Completed   Zoster Vaccines- Shingrix  Completed   HPV VACCINES  Aged Out    Health Maintenance There are no preventive care reminders to display for this patient.  Lung Cancer Screening: (Low Dose CT Chest recommended if Age 530-80years, 30 pack-year currently smoking OR have quit w/in 15years.) does not qualify.   Vision Screening: Recommended annual ophthalmology exams for early detection of glaucoma and other disorders of the eye.  Dental Screening: Recommended annual dental exams for proper oral hygiene  Community Resource Referral / Chronic Care Management: CRR required  this visit?  No   CCM required this visit?  No      Plan:   Keep all routine maintenance appointments.   I have personally reviewed and noted the following in the patient's chart:   Medical and social history Use of alcohol, tobacco or illicit drugs  Current medications and supplements including opioid prescriptions. Patient is currently taking opioid prescriptions. Information provided to patient regarding non-opioid alternatives. Patient advised to discuss non-opioid treatment plan with their provider. [facility]. Visits every 2 months.  Functional ability and status Nutritional status Physical activity Advanced directives List of other physicians Hospitalizations, surgeries, and ER visits in previous 12 months Vitals Screenings to include cognitive, depression, and falls Referrals and appointments  In addition, I have reviewed and discussed with patient certain preventive protocols, quality metrics, and best practice recommendations. A written personalized care plan for preventive services as well as general preventive health recommendations were provided to patient.     Varney Biles, LPN   [id number]

## 2021-07-22 DIAGNOSIS — G894 Chronic pain syndrome: Secondary | ICD-10-CM | POA: Diagnosis not present

## 2021-07-22 DIAGNOSIS — M4326 Fusion of spine, lumbar region: Secondary | ICD-10-CM | POA: Diagnosis not present

## 2021-07-22 DIAGNOSIS — M792 Neuralgia and neuritis, unspecified: Secondary | ICD-10-CM | POA: Diagnosis not present

## 2021-07-22 DIAGNOSIS — Z981 Arthrodesis status: Secondary | ICD-10-CM | POA: Diagnosis not present

## 2021-07-31 ENCOUNTER — Other Ambulatory Visit: Payer: Self-pay | Admitting: Neurology

## 2021-08-01 ENCOUNTER — Other Ambulatory Visit: Payer: Self-pay | Admitting: Family Medicine

## 2021-08-04 ENCOUNTER — Other Ambulatory Visit: Payer: Self-pay | Admitting: Neurology

## 2021-08-19 ENCOUNTER — Ambulatory Visit: Payer: Medicare PPO | Admitting: Family Medicine

## 2021-08-24 DIAGNOSIS — H6123 Impacted cerumen, bilateral: Secondary | ICD-10-CM | POA: Diagnosis not present

## 2021-08-24 DIAGNOSIS — H6983 Other specified disorders of Eustachian tube, bilateral: Secondary | ICD-10-CM | POA: Diagnosis not present

## 2021-09-01 ENCOUNTER — Other Ambulatory Visit: Payer: Self-pay | Admitting: Family Medicine

## 2021-09-09 ENCOUNTER — Telehealth: Payer: Self-pay | Admitting: Family Medicine

## 2021-09-09 ENCOUNTER — Other Ambulatory Visit: Payer: Self-pay | Admitting: Family Medicine

## 2021-09-09 NOTE — Telephone Encounter (Signed)
I called and spoke with the patient and I offered him a visit with the provider and he declined he stated he would treat OVC at home and if it does not help he will call and schedule a visit.  Catheline Hixon,cma

## 2021-09-09 NOTE — Telephone Encounter (Signed)
Patient Called in stated having Congestion and sinus issues in nasal passage . Patient stated that Left ear ache But the right ear is fine. There is pressure around the eye and upper cheek. Patient advise Dr Caryl Bis is aware of sinus  history and requesting  Amoxicillin to be sent to pharmacy

## 2021-09-25 ENCOUNTER — Ambulatory Visit
Admission: RE | Admit: 2021-09-25 | Discharge: 2021-09-25 | Disposition: A | Discharge status: Home / Self Care | Payer: Medicare PPO | Attending: Gastroenterology | Admitting: Gastroenterology

## 2021-09-25 ENCOUNTER — Ambulatory Visit: Payer: Medicare PPO | Admitting: Certified Registered"

## 2021-09-25 ENCOUNTER — Encounter: Payer: Self-pay | Admitting: *Deleted

## 2021-09-25 ENCOUNTER — Other Ambulatory Visit: Payer: Self-pay

## 2021-09-25 ENCOUNTER — Encounter
Admission: RE | Disposition: A | Discharge status: Home / Self Care | Payer: Self-pay | Source: Home / Self Care | Attending: Gastroenterology

## 2021-09-25 DIAGNOSIS — E785 Hyperlipidemia, unspecified: Secondary | ICD-10-CM | POA: Diagnosis not present

## 2021-09-25 DIAGNOSIS — K253 Acute gastric ulcer without hemorrhage or perforation: Secondary | ICD-10-CM | POA: Diagnosis not present

## 2021-09-25 DIAGNOSIS — Z8711 Personal history of peptic ulcer disease: Secondary | ICD-10-CM | POA: Diagnosis not present

## 2021-09-25 DIAGNOSIS — Z7984 Long term (current) use of oral hypoglycemic drugs: Secondary | ICD-10-CM | POA: Insufficient documentation

## 2021-09-25 DIAGNOSIS — K259 Gastric ulcer, unspecified as acute or chronic, without hemorrhage or perforation: Secondary | ICD-10-CM | POA: Diagnosis not present

## 2021-09-25 DIAGNOSIS — Z09 Encounter for follow-up examination after completed treatment for conditions other than malignant neoplasm: Secondary | ICD-10-CM | POA: Diagnosis not present

## 2021-09-25 DIAGNOSIS — Z79899 Other long term (current) drug therapy: Secondary | ICD-10-CM | POA: Insufficient documentation

## 2021-09-25 DIAGNOSIS — I509 Heart failure, unspecified: Secondary | ICD-10-CM | POA: Diagnosis not present

## 2021-09-25 DIAGNOSIS — K219 Gastro-esophageal reflux disease without esophagitis: Secondary | ICD-10-CM | POA: Diagnosis not present

## 2021-09-25 DIAGNOSIS — E1122 Type 2 diabetes mellitus with diabetic chronic kidney disease: Secondary | ICD-10-CM | POA: Insufficient documentation

## 2021-09-25 DIAGNOSIS — Z980 Intestinal bypass and anastomosis status: Secondary | ICD-10-CM | POA: Diagnosis not present

## 2021-09-25 DIAGNOSIS — N189 Chronic kidney disease, unspecified: Secondary | ICD-10-CM | POA: Insufficient documentation

## 2021-09-25 DIAGNOSIS — Z9884 Bariatric surgery status: Secondary | ICD-10-CM | POA: Diagnosis not present

## 2021-09-25 DIAGNOSIS — F419 Anxiety disorder, unspecified: Secondary | ICD-10-CM | POA: Diagnosis not present

## 2021-09-25 DIAGNOSIS — I130 Hypertensive heart and chronic kidney disease with heart failure and stage 1 through stage 4 chronic kidney disease, or unspecified chronic kidney disease: Secondary | ICD-10-CM | POA: Diagnosis not present

## 2021-09-25 DIAGNOSIS — F32A Depression, unspecified: Secondary | ICD-10-CM | POA: Diagnosis not present

## 2021-09-25 HISTORY — PX: ESOPHAGOGASTRODUODENOSCOPY (EGD) WITH PROPOFOL: SHX5813

## 2021-09-25 LAB — GLUCOSE, CAPILLARY: Glucose-Capillary: 113 mg/dL — ABNORMAL HIGH (ref 70–99)

## 2021-09-25 SURGERY — ESOPHAGOGASTRODUODENOSCOPY (EGD) WITH PROPOFOL
Anesthesia: General

## 2021-09-25 MED ORDER — SODIUM CHLORIDE 0.9 % IV SOLN: INTRAVENOUS | Status: DC

## 2021-09-25 MED ORDER — PROPOFOL 500 MG/50ML IV EMUL
INTRAVENOUS | Status: DC | PRN
  Administered 2021-09-25: 150 ug/kg/min via INTRAVENOUS

## 2021-09-25 MED ORDER — LIDOCAINE HCL (CARDIAC) PF 100 MG/5ML IV SOSY
PREFILLED_SYRINGE | INTRAVENOUS | Status: DC | PRN
  Administered 2021-09-25: 100 mg via INTRAVENOUS

## 2021-09-25 MED ORDER — GLYCOPYRROLATE 0.2 MG/ML IJ SOLN
INTRAMUSCULAR | Status: DC | PRN
  Administered 2021-09-25: .2 mg via INTRAVENOUS

## 2021-09-25 MED ORDER — PROPOFOL 10 MG/ML IV BOLUS
INTRAVENOUS | Status: DC | PRN
Start: 2021-09-25 — End: 2021-09-25
  Administered 2021-09-25: 100 mg via INTRAVENOUS

## 2021-09-25 MED ORDER — EPHEDRINE SULFATE (PRESSORS) 50 MG/ML IJ SOLN
INTRAMUSCULAR | Status: DC | PRN
  Administered 2021-09-25: 15 mg via INTRAVENOUS

## 2021-09-25 NOTE — Interval H&P Note (Signed)
History and Physical Interval Note:  09/25/2021 10:13 AM  Justin Patel  has presented today for surgery, with the diagnosis of Gastric Ulcer.  The various methods of treatment have been discussed with the patient and family. After consideration of risks, benefits and other options for treatment, the patient has consented to  Procedure(s): ESOPHAGOGASTRODUODENOSCOPY (EGD) WITH PROPOFOL (N/A) as a surgical intervention.  The patient's history has been reviewed, patient examined, no change in status, stable for surgery.  I have reviewed the patient's chart and labs.  Questions were answered to the patient's satisfaction.     [name]  Ok to proceed with EGD

## 2021-09-25 NOTE — Transfer of Care (Addendum)
Immediate Anesthesia Transfer of Care Note  Patient: Justin Patel  Procedure(s) Performed: ESOPHAGOGASTRODUODENOSCOPY (EGD) WITH PROPOFOL  Patient Location: PACU  Anesthesia Type:General  Level of Consciousness: awake  Airway & Oxygen Therapy: Patient Spontanous Breathing  Post-op Assessment: Report given to RN  Post vital signs: stable  Last Vitals:  Vitals Value Taken Time  BP    Temp    Pulse    Resp    SpO2      Last Pain:  Vitals:   09/25/21 0949  TempSrc: Temporal  PainSc: 5          Complications: No notable events documented.

## 2021-09-25 NOTE — Anesthesia Preprocedure Evaluation (Signed)
Anesthesia Evaluation  Patient identified by MRN, date of birth, ID band Patient awake    Reviewed: Allergy & Precautions, NPO status , Patient's Chart, lab work & pertinent test results  History of Anesthesia Complications Negative for: history of anesthetic complications  Airway Mallampati: II  TM Distance: >3 FB Neck ROM: full    Dental no notable dental hx. (+) Teeth Intact   Pulmonary neg sleep apnea, neg COPD, Patient abstained from smoking.Not current smoker,    Pulmonary exam normal breath sounds clear to auscultation       Cardiovascular Exercise Tolerance: Good METShypertension, Pt. on medications (-) CAD and (-) Past MI Normal cardiovascular exam(-) dysrhythmias  Rhythm:Regular Rate:Normal - Systolic murmurs 1. The left ventricle has normal systolic function, with an ejection  fraction of 55-60%. The cavity size was normal. There is mildly increased  left ventricular wall thickness. Left ventricular diastolic Doppler  parameters are consistent with impaired  relaxation.  2. The right ventricle has normal systolic function. The cavity was  normal. There is no increase in right ventricular wall thickness. Right  ventricular systolic pressure could not be assessed.  3. The tricuspid valve is grossly normal.  4. The aortic valve is tricuspid.  5. The aortic root and ascending aorta are normal in size and structure.  6. The interatrial septum was not well visualized.    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                removal scheduled to clear margins. No date: CHF (congestive heart failure) (HCC)     Comment:  no longer after weight loss No date: Chronic kidney disease     Comment:  cysts No date: Degenerative disc disease No date: Depression No date: Diabetes mellitus     Comment:  no longer diabetic-or on meds No date: DJD (degenerative joint disease) 02/04/2016: Dysplastic nevus     Comment:  R upper back paraspinal - severe No date: Family history of adverse reaction to anesthesia     Comment:  sons wake up combative No date: Gastric ulcer No date: GERD (gastroesophageal reflux disease) No date: H/O hiatal hernia No date: Headache(784.0)     Comment:  sinus No date: Heart murmur No date: Hx of congestive heart failure No date: Hyperlipidemia No date: Hypertension 02/28/2015: Iron deficiency anemia No date: Lumbar scoliosis 06/20/2015: Opiate abuse, continuous (HCC) No date: Sacral fracture, closed ([place]) No date: Seizures ([place])     Comment:  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, after GI bleed No date: Sleep apnea     Comment:  hx not now since wt loss No date: Stones in the urinary tract No date: Syncope and collapse  Past Surgical History: 01/26/2012: ANTERIOR CERVICAL DECOMP/DISCECTOMY FUSION     Comment:  Procedure: ANTERIOR CERVICAL DECOMPRESSION/DISCECTOMY               FUSION 3 LEVELS;  Surgeon: Floyce Stakes, MD;  Location: [place] [facility];  Service: Neurosurgery;                Laterality: N/A;  Cervical three-four Cervical four-five               Cervical five-six Cervical six-seven , Anterior cervical               decompression/diskectomy, fusion, plate 04/25/2018: ANTERIOR LAT LUMBAR FUSION; Right     Comment:  Procedure: Right Lumbar two-three Lumbar three-four               Lumbar four-five anterior  lateral interbody fusion  with               posterior percutaneous pedicle screws;  Surgeon: Erline Levine, MD;  Location: [place];  Service: Neurosurgery;                Laterality: Right; No date: APPENDECTOMY No date: CARDIAC CATHETERIZATION     Comment:  [facility], normal No date: CARDIAC CATHETERIZATION     Comment:  [place] No date: CHOLECYSTECTOMY 12/27/2017: COLONOSCOPY WITH PROPOFOL; N/A     Comment:  Procedure: COLONOSCOPY WITH PROPOFOL;  Surgeon: [place],               Benay Pike, MD;  Location: [facility];  Service:               Gastroenterology;  Laterality: N/A; 12/27/2017: ESOPHAGOGASTRODUODENOSCOPY (EGD) WITH PROPOFOL; N/A     Comment:  Procedure: ESOPHAGOGASTRODUODENOSCOPY (EGD) WITH               PROPOFOL;  Surgeon: Toledo, Benay Pike, MD;  Location:               [facility] ENDOSCOPY;  Service: Gastroenterology;  Laterality:               N/A; 08/09/2008: GASTRIC BYPASS     Comment:  [place] 08/09/1998: HERNIA REPAIR     Comment:  hiatal No date: JOINT REPLACEMENT No date: KNEE ARTHROSCOPY     Comment:  bilateral, left x 2 01/14/2020: LAMINECTOMY WITH POSTERIOR LATERAL ARTHRODESIS LEVEL 4; N/ A     Comment:  Procedure: Decompressive Laminectomy Lumbar One with               pedicle screw fixation from Thoracic Ten to Lumbar Two;                Surgeon: Erline Levine, MD;  Location: [place];  Service:               Neurosurgery;  Laterality: N/A;  Decompressive               Laminectomy Lumbar One with pedicle screw fixation from               Thoracic Ten to Lumbar Two 04/25/2018: LUMBAR PERCUTANEOUS PEDICLE SCREW 3 LEVEL; N/A     Comment:  Procedure: LUMBAR PERCUTANEOUS PEDICLE SCREW 3 LEVEL;                Surgeon: Erline Levine, MD;  Location: [place];  Service:               Neurosurgery;  Laterality: N/A; No date: NASAL SINUS SURGERY     Comment:  x5 08/10/1999: OSTEOTOMY     Comment:  left No date: TONSILLECTOMY 05/09/2014: TOTAL KNEE ARTHROPLASTY; Left  Comment:  Dr. Marry Guan 08/09/2009: UVULOPALATOPLASTY  BMI    Body Mass Index: 30.51 kg/m      Reproductive/Obstetrics negative OB ROS                             Anesthesia Physical  Anesthesia Plan  ASA: 3  Anesthesia Plan: General   Post-op Pain Management: Minimal or no pain anticipated   Induction: Intravenous  PONV Risk Score and Plan: 2 and Propofol infusion and TIVA  Airway Management Planned: Nasal Cannula  Additional Equipment: None  Intra-op Plan:   Post-operative Plan:   Informed Consent: I have reviewed the patients History and Physical, chart, labs and discussed the procedure including the risks, benefits and alternatives for the proposed anesthesia with the patient or authorized representative who has indicated his/her understanding and acceptance.     Dental Advisory Given  Plan Discussed with: CRNA and Surgeon  Anesthesia Plan Comments: (Discussed risks of anesthesia with patient, including possibility of difficulty with spontaneous ventilation under anesthesia necessitating airway intervention, PONV, and rare risks such as cardiac or respiratory or neurological events, and allergic reactions. Discussed the role of CRNA in patient's perioperative care. Patient understands.)        Anesthesia Quick Evaluation

## 2021-09-25 NOTE — H&P (Signed)
Outpatient short stay form Pre-procedure 09/25/2021  [name], MD  Primary Physician: [name], MD  Reason for visit:  Anastomotic Ulcer  History of present illness:    71 y/o gentleman with history of roux-en-y with anastomotic ulcer on last EGD here to assess if healed. No blood thinners. History of cholecystectomy and appendectomy. No family history of GI malignancies.    Current Facility-Administered Medications:    0.9 %  sodium chloride infusion, , Intravenous, Continuous, Vetta Couzens, Hilton Cork, MD, Last Rate: 20 mL/hr at 09/25/21 1008, New Bag at 09/25/21 1008  Medications Prior to Admission  Medication Sig Dispense Refill Last Dose   amLODipine (NORVASC) 10 MG tablet TAKE 1 TABLET BY MOUTH DAILY 90 tablet 1 09/25/2021 at 0730   Calcium Carbonate-Vitamin D (CALCIUM 600/VITAMIN D PO) Take 1 tablet by mouth daily.   09/24/2021   esomeprazole (NEXIUM) 40 MG capsule TAKE 1 CAPSULE BY MOUTH ONCE DAILY (Patient taking differently: in the morning and at bedtime. TAKE 1 CAPSULE BY MOUTH ONCE DAILY) 90 capsule 1 09/25/2021 at 0730   ezetimibe (ZETIA) 10 MG tablet Take 1 tablet (10 mg total) by mouth daily. 90 tablet 1 09/24/2021   furosemide (LASIX) 20 MG tablet TAKE ONE TABLET BY MOUTH EVERY DAY 90 tablet 1 09/24/2021   gabapentin (NEURONTIN) 300 MG capsule Take by mouth.   09/25/2021 at 0730   metoprolol succinate (TOPROL-XL) 50 MG 24 hr tablet TAKE 1 TABLET BY MOUTH DAILY WITH OR IMMEDIATLY FOLLOWING A MEAL 90 tablet 1 09/25/2021 at 0730   Multiple Vitamin (MULTIVITAMIN) capsule Take 1 capsule by mouth daily. With copper and zinc   09/24/2021   primidone (MYSOLINE) 50 MG tablet TAKE 1 TABLET BY MOUTH AT BEDTIME 30 tablet 2 09/24/2021   rosuvastatin (CRESTOR) 40 MG tablet TAKE ONE TABLET EVERY DAY 90 tablet 3 09/24/2021   sucralfate (CARAFATE) 1 g tablet TAKE 1 TABLET BY MOUTH 4 TIMES DAILY 360 tablet 1 09/25/2021 at 0730   tamsulosin (FLOMAX) 0.4 MG CAPS capsule TAKE 1 CAPSULE BY  MOUTH EVERY DAY 90 capsule 3 09/24/2021   TRADJENTA 5 MG TABS tablet TAKE 1 TABLET BY MOUTH DAILY 90 tablet 1 09/25/2021 at 0730   traMADol (ULTRAM) 50 MG tablet Take 50 mg by mouth every 6 (six) hours as needed.   09/24/2021   venlafaxine XR (EFFEXOR-XR) 75 MG 24 hr capsule TAKE ONE CAPSULE EVERY MORNING WITH BREAKFAST 90 capsule 1 09/25/2021 at 0730   blood glucose meter kit and supplies KIT Dispense based on patient and insurance preference. Check once daily. ICD doing E11.9. 1 each 0    cyclobenzaprine (FLEXERIL) 5 MG tablet Take 5-10 mg by mouth every 8 (eight) hours as needed.      metroNIDAZOLE (METROGEL) 0.75 % gel Apply 1 application topically 2 (two) times daily. For rosacea - apply to the cheeks, chin, and nose BID. 45 g 3    nystatin cream (MYCOSTATIN) Apply 1 application topically 2 (two) times daily. (Patient not taking: Reported on 06/17/2021) 30 g 0    nystatin-triamcinolone ointment (MYCOLOG) Apply 1 application topically 2 (two) times daily. (Patient not taking: Reported on 06/17/2021) 30 g 0      Allergies  Allergen Reactions   Altace [Ramipril] Anaphylaxis   Levaquin [Levofloxacin In D5w] Hives   Ace Inhibitors Hives   Iodine Hives   Levofloxacin    Conray [Iothalamate] Hives    HIVES, he had this reaction to Ionic contrast in 1974 for an IVP. SPM  Lyrica [Pregabalin] Other (See Comments)    Edema   Metformin Diarrhea    High doses cause diarrhea.    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  removal scheduled to clear margins.   CHF (congestive heart failure) (HCC)    no longer after weight loss   Chronic kidney disease    cysts   Degenerative disc disease    Depression    Diabetes mellitus    no longer diabetic-or on meds   DJD (degenerative joint disease)     Dysplastic nevus 02/04/2016   R upper back paraspinal - severe   Family history of adverse reaction to anesthesia    sons wake up combative   Gastric ulcer    GERD (gastroesophageal reflux disease)    H/O hiatal hernia    Headache(784.0)    sinus   Heart murmur    Hx of congestive heart failure    Hyperlipidemia    Hypertension    Iron deficiency anemia 02/28/2015   Lumbar scoliosis    Opiate abuse, continuous ([place]) 06/20/2015   Sacral fracture, closed ([place])    Seizures ([place])    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, after GI bleed   Sleep apnea    hx not now since wt loss   Stones in the urinary tract    Syncope and collapse     Review of systems:  Otherwise negative.    Physical Exam  Gen: Alert, oriented. Appears stated age.  HEENT: PERRLA. Lungs: No respiratory distress CV: RRR Abd: soft, benign, no masses Ext: No edema    Planned procedures: Proceed with EGD. The patient understands the nature of the planned procedure, indications, risks, alternatives and potential complications including but not limited to bleeding, infection, perforation, damage to internal organs and possible oversedation/side effects from anesthesia. The patient agrees and gives consent to proceed.  Please refer to procedure notes for findings, recommendations and patient disposition/instructions.     [name], MD [facility] Gastroenterology

## 2021-09-25 NOTE — Op Note (Signed)
[facility] Gastroenterology Patient Name: Justin Patel Procedure Date: 09/25/2021 10:02 AM MRN: [id number] Account #: [id number] Date of Birth: 03/10/1951 Admit Type: Outpatient Age: 71 Room: [facility] ENDO ROOM 3 Gender: Male Note Status: Finalized Instrument Name: Upper Endoscope [id number] Procedure:             Upper GI endoscopy Indications:           Follow-up of acute gastric ulcer Providers:             Andrey Farmer MD, MD Medicines:             Monitored Anesthesia Care Complications:         No immediate complications. Procedure:             Pre-Anesthesia Assessment:                        - Prior to the procedure, a History and Physical was                         performed, and patient medications and allergies were                         reviewed. The patient is competent. The risks and                         benefits of the procedure and the sedation options and                         risks were discussed with the patient. All questions                         were answered and informed consent was obtained.                         Patient identification and proposed procedure were                         verified by the physician, the nurse, the                         anesthesiologist, the anesthetist and the technician                         in the endoscopy suite. Mental Status Examination:                         alert and oriented. Airway Examination: normal                         oropharyngeal airway and neck mobility. Respiratory                         Examination: clear to auscultation. CV Examination:                         normal. Prophylactic Antibiotics: The patient does not                         require prophylactic antibiotics. Prior  Anticoagulants: The patient has taken no previous                         anticoagulant or antiplatelet agents. ASA Grade                         Assessment: II - A patient  with mild systemic disease.                         After reviewing the risks and benefits, the patient                         was deemed in satisfactory condition to undergo the                         procedure. The anesthesia plan was to use monitored                         anesthesia care (MAC). Immediately prior to                         administration of medications, the patient was                         re-assessed for adequacy to receive sedatives. The                         heart rate, respiratory rate, oxygen saturations,                         blood pressure, adequacy of pulmonary ventilation, and                         response to care were monitored throughout the                         procedure. The physical status of the patient was                         re-assessed after the procedure.                        After obtaining informed consent, the endoscope was                         passed under direct vision. Throughout the procedure,                         the patient's blood pressure, pulse, and oxygen                         saturations were monitored continuously. The Endoscope                         was introduced through the mouth, and advanced to the                         second part of duodenum. The upper GI endoscopy was  accomplished without difficulty. The patient tolerated                         the procedure well. Findings:      The examined esophagus was normal.      Evidence of a Roux-en-Y gastrojejunostomy was found. The gastrojejunal       anastomosis was characterized by healthy appearing mucosa. The       jejunojejunal anastomosis was characterized by healthy appearing mucosa.       Previous gastric ulcer has healed.      The examined jejunum was normal. Impression:            - Normal esophagus.                        - Roux-en-Y gastrojejunostomy with gastrojejunal                         anastomosis  characterized by healthy appearing mucosa.                        - Normal examined jejunum.                        - No specimens collected. Recommendation:        - Discharge patient to home.                        - Resume previous diet.                        - Continue present medications.                        - Return to referring physician as previously                         scheduled. Procedure Code(s):     --- Professional ---                        [phone], Esophagogastroduodenoscopy, flexible,                         transoral; diagnostic, including collection of                         specimen(s) by brushing or washing, when performed                         (separate procedure) Diagnosis Code(s):     --- Professional ---                        Z98.0, Intestinal bypass and anastomosis status                        K25.3, Acute gastric ulcer without hemorrhage or                         perforation CPT copyright 2019 [employer]. All rights reserved. The codes documented in this report are preliminary and upon coder review may  be revised to meet current compliance  requirements. Andrey Farmer MD, MD 09/25/2021 10:33:14 AM Number of Addenda: 0 Note Initiated On: 09/25/2021 10:02 AM Estimated Blood Loss:  Estimated blood loss: none.      [facility]

## 2021-09-25 NOTE — Anesthesia Postprocedure Evaluation (Signed)
Anesthesia Post Note  Patient: MORGON PAMER  Procedure(s) Performed: ESOPHAGOGASTRODUODENOSCOPY (EGD) WITH PROPOFOL  Patient location during evaluation: Endoscopy Anesthesia Type: General Level of consciousness: awake and alert Pain management: pain level controlled Vital Signs Assessment: post-procedure vital signs reviewed and stable Respiratory status: spontaneous breathing, nonlabored ventilation, respiratory function stable and patient connected to nasal cannula oxygen Cardiovascular status: blood pressure returned to baseline and stable Postop Assessment: no apparent nausea or vomiting Anesthetic complications: no   No notable events documented.   Last Vitals:  Vitals:   09/25/21 1045 09/25/21 1055  BP: (!) 89/60 90/64  Pulse: 81 77  Resp: 16 (!) 21  Temp:    SpO2: 99% 100%    Last Pain:  Vitals:   09/25/21 1055  TempSrc:   PainSc: 0-No pain                 Arita Miss

## 2021-09-28 ENCOUNTER — Encounter: Payer: Self-pay | Admitting: Gastroenterology

## 2021-09-30 ENCOUNTER — Other Ambulatory Visit: Payer: Self-pay | Admitting: Neurology

## 2021-10-05 ENCOUNTER — Other Ambulatory Visit: Payer: Self-pay | Admitting: Cardiovascular Disease

## 2021-10-05 ENCOUNTER — Other Ambulatory Visit: Payer: Self-pay | Admitting: Family Medicine

## 2021-10-05 DIAGNOSIS — E119 Type 2 diabetes mellitus without complications: Secondary | ICD-10-CM

## 2021-10-05 DIAGNOSIS — E785 Hyperlipidemia, unspecified: Secondary | ICD-10-CM

## 2021-10-05 NOTE — Telephone Encounter (Signed)
Please schedule overdue F/U appointment. Thank you! ?

## 2021-10-07 ENCOUNTER — Ambulatory Visit: Payer: Medicare PPO | Admitting: Medical

## 2021-10-07 DIAGNOSIS — M5416 Radiculopathy, lumbar region: Secondary | ICD-10-CM | POA: Diagnosis not present

## 2021-10-07 DIAGNOSIS — F1120 Opioid dependence, uncomplicated: Secondary | ICD-10-CM | POA: Diagnosis not present

## 2021-10-07 DIAGNOSIS — M546 Pain in thoracic spine: Secondary | ICD-10-CM | POA: Diagnosis not present

## 2021-10-07 DIAGNOSIS — M4326 Fusion of spine, lumbar region: Secondary | ICD-10-CM | POA: Diagnosis not present
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
## 2021-10-16 ENCOUNTER — Telehealth (INDEPENDENT_AMBULATORY_CARE_PROVIDER_SITE_OTHER): Payer: Medicare PPO | Admitting: Neurology

## 2021-10-16 ENCOUNTER — Other Ambulatory Visit: Payer: Self-pay

## 2021-10-16 ENCOUNTER — Encounter: Payer: Self-pay | Admitting: Neurology

## 2021-10-16 VITALS — Ht 67.0 in | Wt 160.0 lb

## 2021-10-16 DIAGNOSIS — G250 Essential tremor: Secondary | ICD-10-CM

## 2021-10-16 MED ORDER — PRIMIDONE 50 MG PO TABS
50.0000 mg | ORAL_TABLET | Freq: Every day | ORAL | 11 refills | Status: DC
Start: 2021-10-16 — End: 2022-10-20

## 2021-10-22 ENCOUNTER — Ambulatory Visit: Payer: Medicare PPO | Admitting: Dermatology

## 2021-10-30 DIAGNOSIS — R933 Abnormal findings on diagnostic imaging of other parts of digestive tract: Secondary | ICD-10-CM | POA: Diagnosis not present

## 2021-10-30 DIAGNOSIS — K862 Cyst of pancreas: Secondary | ICD-10-CM | POA: Diagnosis not present

## 2021-11-04 DIAGNOSIS — M810 Age-related osteoporosis without current pathological fracture: Secondary | ICD-10-CM | POA: Diagnosis not present

## 2021-12-02 DIAGNOSIS — M810 Age-related osteoporosis without current pathological fracture: Secondary | ICD-10-CM | POA: Diagnosis not present

## 2021-12-04 ENCOUNTER — Other Ambulatory Visit: Payer: Self-pay | Admitting: Family Medicine

## 2021-12-05 ENCOUNTER — Other Ambulatory Visit: Payer: Self-pay | Admitting: Family Medicine

## 2021-12-05 DIAGNOSIS — I10 Essential (primary) hypertension: Secondary | ICD-10-CM

## 2021-12-17 ENCOUNTER — Emergency Department: Payer: Medicare PPO

## 2021-12-17 ENCOUNTER — Other Ambulatory Visit: Payer: Self-pay

## 2021-12-17 ENCOUNTER — Emergency Department
Admission: EM | Admit: 2021-12-17 | Discharge: 2021-12-17 | Disposition: A | Discharge status: Home / Self Care | Payer: Medicare PPO | Attending: Emergency Medicine | Admitting: Emergency Medicine

## 2021-12-17 DIAGNOSIS — N289 Disorder of kidney and ureter, unspecified: Secondary | ICD-10-CM | POA: Diagnosis not present

## 2021-12-17 DIAGNOSIS — I110 Hypertensive heart disease with heart failure: Secondary | ICD-10-CM | POA: Insufficient documentation

## 2021-12-17 DIAGNOSIS — I509 Heart failure, unspecified: Secondary | ICD-10-CM | POA: Diagnosis not present

## 2021-12-17 DIAGNOSIS — I959 Hypotension, unspecified: Secondary | ICD-10-CM | POA: Diagnosis not present

## 2021-12-17 DIAGNOSIS — E119 Type 2 diabetes mellitus without complications: Secondary | ICD-10-CM | POA: Insufficient documentation

## 2021-12-17 DIAGNOSIS — R531 Weakness: Secondary | ICD-10-CM | POA: Diagnosis not present

## 2021-12-17 DIAGNOSIS — R42 Dizziness and giddiness: Secondary | ICD-10-CM | POA: Diagnosis not present

## 2021-12-17 DIAGNOSIS — M549 Dorsalgia, unspecified: Secondary | ICD-10-CM | POA: Insufficient documentation

## 2021-12-17 DIAGNOSIS — N179 Acute kidney failure, unspecified: Secondary | ICD-10-CM

## 2021-12-17 DIAGNOSIS — R55 Syncope and collapse: Secondary | ICD-10-CM | POA: Insufficient documentation

## 2021-12-17 DIAGNOSIS — K449 Diaphragmatic hernia without obstruction or gangrene: Secondary | ICD-10-CM | POA: Diagnosis not present

## 2021-12-17 DIAGNOSIS — M8448XA Pathological fracture, other site, initial encounter for fracture: Secondary | ICD-10-CM | POA: Diagnosis not present

## 2021-12-17 DIAGNOSIS — W19XXXA Unspecified fall, initial encounter: Secondary | ICD-10-CM | POA: Diagnosis not present

## 2021-12-17 DIAGNOSIS — I2510 Atherosclerotic heart disease of native coronary artery without angina pectoris: Secondary | ICD-10-CM | POA: Diagnosis not present

## 2021-12-17 LAB — CBC
HCT: 40.3 % (ref 39.0–52.0)
Hemoglobin: 12.7 g/dL — ABNORMAL LOW (ref 13.0–17.0)
MCH: 29.3 pg (ref 26.0–34.0)
MCHC: 31.5 g/dL (ref 30.0–36.0)
MCV: 93.1 fL (ref 80.0–100.0)
Platelets: 247 10*3/uL (ref 150–400)
RBC: 4.33 MIL/uL (ref 4.22–5.81)
RDW: 15.4 % (ref 11.5–15.5)
WBC: 12.2 10*3/uL — ABNORMAL HIGH (ref 4.0–10.5)
nRBC: 0 % (ref 0.0–0.2)

## 2021-12-17 LAB — BASIC METABOLIC PANEL
Anion gap: 6 (ref 5–15)
BUN: 27 mg/dL — ABNORMAL HIGH (ref 8–23)
CO2: 25 mmol/L (ref 22–32)
Calcium: 9.3 mg/dL (ref 8.9–10.3)
Chloride: 106 mmol/L (ref 98–111)
Creatinine, Ser: 1.92 mg/dL — ABNORMAL HIGH (ref 0.61–1.24)
GFR, Estimated: 37 mL/min — ABNORMAL LOW (ref >60)
Glucose, Bld: 93 mg/dL (ref 70–99)
Potassium: 3.5 mmol/L (ref 3.5–5.1)
Sodium: 137 mmol/L (ref 135–145)

## 2021-12-17 LAB — TROPONIN I (HIGH SENSITIVITY): Troponin I (High Sensitivity): 10 ng/L (ref <18)

## 2021-12-17 MED ORDER — SODIUM CHLORIDE 0.9 % IV BOLUS
1000.0000 mL | Freq: Once | INTRAVENOUS | Status: AC
  Administered 2021-12-17: 1000 mL via INTRAVENOUS

## 2021-12-17 MED ORDER — FENTANYL CITRATE PF 50 MCG/ML IJ SOSY
50.0000 ug | PREFILLED_SYRINGE | Freq: Once | INTRAMUSCULAR | Status: AC
  Administered 2021-12-17: 50 ug via INTRAVENOUS
  Filled 2021-12-17: qty 1

## 2021-12-17 NOTE — ED Triage Notes (Signed)
Patient arrived by EMS from home for hypotension. C/o lightheadedness and dizziness that led to syncope and woke up in kitchen floor. EMS blood pressure 130/80. C/o increased back pain but does have chronic back pain. EMS administed 567m fluid. ?

## 2021-12-17 NOTE — ED Provider Notes (Signed)
? ?[facility] ?Provider Note ? ? ? Event Date/Time  ? First MD Initiated Contact with Patient 12/17/21 2115   ?  (approximate) ? ?History  ? ?Chief Complaint: Loss of Consciousness ? ?HPI ? ?Justin Patel is a 71 y.o. male past medical history of anemia, CHF, diabetes, hypertension, hyperlipidemia, chronic back pain, presents to the emergency department for a near syncopal episode.  According to the patient he has chronic pain, takes pain medication at home for chronic back pain. ?According to the wife patient does not always eat and drink as he should and has chronic constipation and then has multiple bowel movements at once.  Patient states today he was having multiple bowel movements 6 or 7 in total per patient.  States he has not eaten or drink very much today.  States after getting home from their doctor's appointment he was feeling lightheaded and he felt his knees buckle under him causing him to fall and patient had a brief syncopal versus near syncopal event.  Patient has a history of chronic back pain with back surgeries previously.  States back pain upon arrival.  No weakness or numbness.  No incontinence. ? ?Physical Exam  ? ?Triage Vital Signs: ?ED Triage Vitals  ?Enc Vitals Group  ?   BP 12/17/21 1727 115/73  ?   Pulse Rate 12/17/21 1727 72  ?   Resp 12/17/21 1727 18  ?   Temp 12/17/21 1727 98.2 ?F (36.8 ?C)  ?   Temp Source 12/17/21 1727 Oral  ?   SpO2 12/17/21 1727 93 %  ?   Weight 12/17/21 1728 160 lb (72.6 kg)  ?   Height 12/17/21 1728 '5\' 7"'$  (1.702 m)  ?   Head Circumference --   ?   Peak Flow --   ?   Pain Score 12/17/21 1728 4  ?   Pain Loc --   ?   Pain Edu? --   ?   Excl. in [place]? --   ? ? ?Most recent vital signs: ?Vitals:  ? 12/17/21 1727 12/17/21 2112  ?BP: 115/73 (!) 129/99  ?Pulse: 72 71  ?Resp: 18 17  ?Temp: 98.2 ?F (36.8 ?C)   ?SpO2: 93% 100%  ? ? ?General: Awake, no distress.  ?CV:  Good peripheral perfusion.  Regular rate and rhythm  ?Resp:  Normal effort.  Equal  breath sounds bilaterally.  ?Abd:  No distention.  Soft, nontender.  No rebound or guarding. ? ? ? ?ED Results / Procedures / Treatments  ? ?EKG ? ?EKG viewed and interpreted by myself shows a normal sinus rhythm at 73 bpm with a narrow QRS, normal axis, intervals, no concerning ST changes. ? ?RADIOLOGY ? ?Personally reviewed the CT images of the head, no concerning findings on my examination. ?Radiology is reviewed the CT scan of the head showing no concerning finding. ?CT scans of the T and L-spine shows stable findings with no acute issue. ? ? ?MEDICATIONS ORDERED IN ED: ?Medications  ?fentaNYL (SUBLIMAZE) injection 50 mcg (has no administration in time range)  ?sodium chloride 0.9 % bolus 1,000 mL (1,000 mLs Intravenous New Bag/Given 12/17/21 2124)  ? ? ? ?IMPRESSION / MDM / ASSESSMENT AND PLAN / ED COURSE  ?I reviewed the triage vital signs and the nursing notes. ? ?Presents emergency department after a near syncopal versus brief syncopal episode and fall.  Patient has history of chronic back pain takes pain medication at home.  States he was having multiple bowel movements  today which is typical states he often will have many days of constipation followed by a significant amount of bowel movements.  Today he was feeling somewhat weak and lightheaded while walking felt his legs Quadramet he fell to the ground.  Overall the patient appears well no concerning findings on physical exam.  Patient CT scan of the head T and L-spine show no concerning findings.  Patient's lab work however does show acute renal insufficiency with a creatinine of 1.92.  Compared to a baseline closer to 1.0.  Patient received some IV fluids by EMS we will dose additional liter in the emergency department.  CBC shows slight leukocytosis otherwise reassuring.  We will obtain a troponin and continue to closely monitor.  We will dose a small amount of pain medication while awaiting results.  Patient agreeable to plan of care.  Strongly  suspect the renal insufficiency and dehydration could be related to patient's syncopal or brief near syncopal event today.  Discussed with patient to continue drinking adequate fluids and follow-up with his doctor on Monday for recheck of his labs.  Patient agreeable to plan. ? ?Patient's troponin is negative.  Patient states he is feeling much better after fluids.  We will discharge patient with PCP follow-up.  Patient agreeable to plan of care. ? ?FINAL CLINICAL IMPRESSION(S) / ED DIAGNOSES  ? ?Syncope ?Back pain ?Acute renal insufficiency ? ?Rx / DC Orders  ? ?Increase fluid intake ?Follow-up with your doctor on Monday for recheck of your labs. ? ?Note:  This document was prepared using Dragon voice recognition software and may include unintentional dictation errors. ?  ?Harvest Dark, MD ?12/17/21 2240 ? ?

## 2021-12-17 NOTE — Discharge Instructions (Addendum)
As we discussed please drink plenty of fluids in the weekend.  Please follow-up with your doctor for recheck of your labs on Monday.  Return to the emergency department for any further episodes of lightheadedness dizziness or if you feel like you are going to pass out. ?

## 2021-12-19 ENCOUNTER — Other Ambulatory Visit: Payer: Self-pay | Admitting: Family Medicine

## 2021-12-23 DIAGNOSIS — F1120 Opioid dependence, uncomplicated: Secondary | ICD-10-CM | POA: Diagnosis not present

## 2021-12-23 DIAGNOSIS — M546 Pain in thoracic spine: Secondary | ICD-10-CM | POA: Diagnosis not present

## 2021-12-23 DIAGNOSIS — M4326 Fusion of spine, lumbar region: Secondary | ICD-10-CM | POA: Diagnosis not present

## 2021-12-28 ENCOUNTER — Ambulatory Visit: Payer: Medicare PPO | Admitting: Family Medicine

## 2021-12-28 ENCOUNTER — Encounter: Payer: Self-pay | Admitting: Family Medicine

## 2021-12-28 DIAGNOSIS — I10 Essential (primary) hypertension: Secondary | ICD-10-CM | POA: Diagnosis not present

## 2021-12-28 DIAGNOSIS — Z981 Arthrodesis status: Secondary | ICD-10-CM

## 2021-12-28 DIAGNOSIS — M546 Pain in thoracic spine: Secondary | ICD-10-CM | POA: Insufficient documentation

## 2021-12-28 DIAGNOSIS — E119 Type 2 diabetes mellitus without complications: Secondary | ICD-10-CM | POA: Diagnosis not present

## 2021-12-28 DIAGNOSIS — R55 Syncope and collapse: Secondary | ICD-10-CM

## 2021-12-28 DIAGNOSIS — M5136 Other intervertebral disc degeneration, lumbar region: Secondary | ICD-10-CM

## 2021-12-28 HISTORY — DX: Arthrodesis status: Z98.1

## 2021-12-28 LAB — BASIC METABOLIC PANEL
BUN: 20 mg/dL (ref 6–23)
CO2: 29 mEq/L (ref 19–32)
Calcium: 8.9 mg/dL (ref 8.4–10.5)
Chloride: 102 mEq/L (ref 96–112)
Creatinine, Ser: 1.42 mg/dL (ref 0.40–1.50)
GFR: 49.93 mL/min — ABNORMAL LOW (ref >60.00)
Glucose, Bld: 130 mg/dL — ABNORMAL HIGH (ref 70–99)
Potassium: 4.9 mEq/L (ref 3.5–5.1)
Sodium: 137 mEq/L (ref 135–145)

## 2021-12-28 LAB — CBC WITH DIFFERENTIAL/PLATELET
Basophils Absolute: 0 10*3/uL (ref 0.0–0.1)
Basophils Relative: 0.5 % (ref 0.0–3.0)
Eosinophils Absolute: 0.2 10*3/uL (ref 0.0–0.7)
Eosinophils Relative: 3.7 % (ref 0.0–5.0)
HCT: 36.3 % — ABNORMAL LOW (ref 39.0–52.0)
Hemoglobin: 11.7 g/dL — ABNORMAL LOW (ref 13.0–17.0)
Lymphocytes Relative: 35.4 % (ref 12.0–46.0)
Lymphs Abs: 2 10*3/uL (ref 0.7–4.0)
MCHC: 32.2 g/dL (ref 30.0–36.0)
MCV: 92.3 fl (ref 78.0–100.0)
Monocytes Absolute: 0.6 10*3/uL (ref 0.1–1.0)
Monocytes Relative: 10.2 % (ref 3.0–12.0)
Neutro Abs: 2.8 10*3/uL (ref 1.4–7.7)
Neutrophils Relative %: 50.2 % (ref 43.0–77.0)
Platelets: 243 10*3/uL (ref 150.0–400.0)
RBC: 3.93 Mil/uL — ABNORMAL LOW (ref 4.22–5.81)
RDW: 17 % — ABNORMAL HIGH (ref 11.5–15.5)
WBC: 5.6 10*3/uL (ref 4.0–10.5)

## 2021-12-28 LAB — HEMOGLOBIN A1C: Hgb A1c MFr Bld: 7.3 % — ABNORMAL HIGH (ref 4.6–6.5)

## 2021-12-28 MED ORDER — AMLODIPINE BESYLATE 10 MG PO TABS: 5.0000 mg | ORAL_TABLET | Freq: Every day | ORAL | 1 refills | Status: DC

## 2021-12-28 NOTE — Progress Notes (Signed)
[name], MD Phone: [phone]  Justin Patel is a 71 y.o. male who presents today for follow-up.  Syncope: The patient had a syncopal episode on 12/17/2021.  He was evaluated in the emergency room for this.  He reports having had 6-7 bowel movements within an hour prior to having a syncopal episode.  He notes that diarrhea typically follows him holding his opiates for a while.  He also had not eaten or had much to drink.  He felt lightheaded.  He notes he got out of the car and went inside and then fell to the floor and passed out once he was on the floor.  He noted no injuries related to this.  He had a creatinine of 1.92 which was up from a baseline of around 1.  He was given fluids.  He had no acute changes on CT head, thoracic spine, or lumbar spine.  His EKG was reassuring.  He had a negative troponin.  It was felt as though he was dehydrated leading to his syncopal episode.  He notes no recurrent syncopal episodes.  He has not had any other lightheadedness.  He has increased his water intake to 4 large glasses daily.  Chronic back pain: Patient notes he has been off of opiates since 12/17/2021.  He has not had any withdrawal symptoms.  He sees his surgeon on 6/14 as they noted loose screws on his recent CT imaging.  He was following with pain management that they noted that he could follow-up with me given that he is no longer on opiates.  He is on gabapentin 300 mg 3 times daily and they also started him on Flexeril.  He takes 2 arthritis strength Tylenol in the morning and then 2 Tylenol PM in the evening.  HYPERTENSION Disease Monitoring Home BP Monitoring reports normal though has been on the low end of normal recently chest pain- no    Dyspnea- no Medications Compliance-  taking amlodipine, lasix, metoprolol.  Edema- no BMET    Component Value Date/Time   NA 137 12/17/2021 1731   NA 142 02/15/2019 0932   NA 140 05/17/2014 0529   K 3.5 12/17/2021 1731   K 3.8 05/17/2014  0529   CL 106 12/17/2021 1731   CL 105 05/17/2014 0529   CO2 25 12/17/2021 1731   CO2 27 05/17/2014 0529   GLUCOSE 93 12/17/2021 1731   GLUCOSE 112 (H) 05/17/2014 0529   BUN 27 (H) 12/17/2021 1731   BUN 29 (H) 02/15/2019 0932   BUN 10 05/17/2014 0529   CREATININE 1.92 (H) 12/17/2021 1731   CREATININE 1.34 (H) 12/19/2015 1646   CALCIUM 9.3 12/17/2021 1731   CALCIUM 8.2 (L) 05/17/2014 0529   GFRNONAA 37 (L) 12/17/2021 1731   GFRNONAA >60 05/17/2014 0529   GFRNONAA >60 05/01/2014 0928   GFRAA >60 01/30/2020 0129   GFRAA >60 05/17/2014 0529   GFRAA >60 05/01/2014 0928   DIABETES Disease Monitoring: Blood Sugar ranges-120-125 Polyuria/phagia/dipsia- no      Medications: Compliance- taking tradjenta Hypoglycemic symptoms- no   Social History   Tobacco Use  Smoking Status Never  Smokeless Tobacco Never    Current Outpatient Medications on File Prior to Visit  Medication Sig Dispense Refill   blood glucose meter kit and supplies KIT Dispense based on patient and insurance preference. Check once daily. ICD doing E11.9. 1 each 0   Calcium Carbonate-Vitamin D (CALCIUM 600/VITAMIN D PO) Take 1 tablet by mouth daily.     esomeprazole (  NEXIUM) 40 MG capsule TAKE 1 CAPSULE BY MOUTH ONCE DAILY (Patient taking differently: in the morning and at bedtime. TAKE 1 CAPSULE BY MOUTH ONCE DAILY) 90 capsule 1   ezetimibe (ZETIA) 10 MG tablet TAKE ONE TABLET BY MOUTH EVERY DAY 90 tablet 1   furosemide (LASIX) 20 MG tablet TAKE ONE TABLET BY MOUTH EVERY DAY 90 tablet 1   gabapentin (NEURONTIN) 300 MG capsule 300 mg 3 (three) times daily     metoprolol succinate (TOPROL-XL) 50 MG 24 hr tablet TAKE 1 TABLET BY MOUTH DAILY WITH OR IMMEDIATLY FOLLOWING A MEAL 90 tablet 1   metroNIDAZOLE (METROGEL) 0.75 % gel Apply 1 application topically 2 (two) times daily. For rosacea - apply to the cheeks, chin, and nose BID. 45 g 3   Multiple Vitamin (MULTIVITAMIN) capsule Take 1 capsule by mouth daily. With copper  and zinc     nystatin-triamcinolone ointment (MYCOLOG) Apply 1 application topically 2 (two) times daily. 30 g 0   primidone (MYSOLINE) 50 MG tablet Take 1 tablet (50 mg total) by mouth at bedtime. 30 tablet 11   rosuvastatin (CRESTOR) 40 MG tablet TAKE ONE TABLET EVERY DAY 90 tablet 0   sucralfate (CARAFATE) 1 g tablet TAKE 1 TABLET BY MOUTH 4 TIMES DAILY 360 tablet 1   tamsulosin (FLOMAX) 0.4 MG CAPS capsule TAKE 1 CAPSULE BY MOUTH EVERY DAY 90 capsule 3   TRADJENTA 5 MG TABS tablet TAKE 1 TABLET BY MOUTH DAILY 90 tablet 1   traMADol (ULTRAM) 50 MG tablet Take 50 mg by mouth every 6 (six) hours as needed.     venlafaxine XR (EFFEXOR-XR) 75 MG 24 hr capsule TAKE ONE CAPSULE EVERY MORNING WITH BREAKFAST 90 capsule 1   No current facility-administered medications on file prior to visit.     ROS see history of present illness  Objective  Physical Exam Vitals:   12/28/21 0954  BP: 100/60  Pulse: 71  Temp: 98.4 F (36.9 C)  SpO2: 97%    BP Readings from Last 3 Encounters:  12/28/21 100/60  12/17/21 (!) 143/91  09/25/21 90/64   Wt Readings from Last 3 Encounters:  12/28/21 179 lb 9.6 oz (81.5 kg)  12/17/21 160 lb (72.6 kg)  10/16/21 160 lb (72.6 kg)    Physical Exam Constitutional:      General: He is not in acute distress.    Appearance: He is not diaphoretic.  Cardiovascular:     Rate and Rhythm: Normal rate and regular rhythm.     Heart sounds: Normal heart sounds.  Pulmonary:     Effort: Pulmonary effort is normal.     Breath sounds: Normal breath sounds.  Skin:    General: Skin is warm and dry.  Neurological:     Mental Status: He is alert.     Assessment/Plan: Please see individual problem list.  Problem List Items Addressed This Visit     DDD (degenerative disc disease), lumbar (Chronic)    Chronic pain related to back issues.  He will continue to see his surgeon.  I advised I could take over prescriptions for his gabapentin and cyclobenzaprine.  He will  continue gabapentin 300 mg 3 times daily.       Diabetes (HCC) (Chronic)    Check A1c.  Continue Tradjenta 5 mg daily.       Relevant Orders   HgB A1c   Essential hypertension, benign (Chronic)    Patient's blood pressures have been on the low end of normal.  He did recently have a syncopal episode that was likely related to dehydration though overtreatment of his blood pressure could be playing a role as well.  We will reduce his amlodipine to 5 mg once daily.  He will continue Lasix 20 mg daily and metoprolol 50 mg daily.  He will return in 1 month for blood pressure check with me.       Relevant Medications   amLODipine (NORVASC) 10 MG tablet   Syncope    Likely related to dehydration and low blood pressure.  Discussed adequate hydration.  We will also be reducing his amlodipine dose.  We will recheck labs today.       Relevant Orders   CBC w/Diff   Basic Metabolic Panel (BMET)    Return in about 1 month (around 01/28/2022) for BP check with PCP.   [name], MD [place]

## 2021-12-28 NOTE — Assessment & Plan Note (Signed)
Patient's blood pressures have been on the low end of normal.  He did recently have a syncopal episode that was likely related to dehydration though overtreatment of his blood pressure could be playing a role as well.  We will reduce his amlodipine to 5 mg once daily.  He will continue Lasix 20 mg daily and metoprolol 50 mg daily.  He will return in 1 month for blood pressure check with me.

## 2021-12-28 NOTE — Patient Instructions (Signed)
Nice to see you. We will reduce your amlodipine dose to 5 mg once daily. Please make sure you are drinking plenty of water. Please let me know when you need a refill of your gabapentin and cyclobenzaprine. We will contact you with your lab results.

## 2021-12-28 NOTE — Assessment & Plan Note (Signed)
Chronic pain related to back issues.  He will continue to see his surgeon.  I advised I could take over prescriptions for his gabapentin and cyclobenzaprine.  He will continue gabapentin 300 mg 3 times daily.

## 2021-12-28 NOTE — Assessment & Plan Note (Signed)
Likely related to dehydration and low blood pressure.  Discussed adequate hydration.  We will also be reducing his amlodipine dose.  We will recheck labs today.

## 2021-12-28 NOTE — Assessment & Plan Note (Signed)
Check A1c.  Continue Tradjenta 5 mg daily. 

## 2021-12-29 ENCOUNTER — Other Ambulatory Visit: Payer: Self-pay

## 2021-12-29 DIAGNOSIS — D649 Anemia, unspecified: Secondary | ICD-10-CM

## 2021-12-30 NOTE — Addendum Note (Signed)
Addended by: Caryl Bis, Chevie Birkhead G on: 12/30/2021 10:20 AM   Modules accepted: Orders

## 2022-01-05 ENCOUNTER — Other Ambulatory Visit (INDEPENDENT_AMBULATORY_CARE_PROVIDER_SITE_OTHER): Payer: Medicare PPO

## 2022-01-05 ENCOUNTER — Other Ambulatory Visit: Payer: Self-pay

## 2022-01-05 DIAGNOSIS — E119 Type 2 diabetes mellitus without complications: Secondary | ICD-10-CM

## 2022-01-05 DIAGNOSIS — D649 Anemia, unspecified: Secondary | ICD-10-CM

## 2022-01-05 LAB — CBC
HCT: 38.3 % — ABNORMAL LOW (ref 39.0–52.0)
Hemoglobin: 12.3 g/dL — ABNORMAL LOW (ref 13.0–17.0)
MCHC: 32 g/dL (ref 30.0–36.0)
MCV: 91.4 fl (ref 78.0–100.0)
Platelets: 207 10*3/uL (ref 150.0–400.0)
RBC: 4.19 Mil/uL — ABNORMAL LOW (ref 4.22–5.81)
RDW: 16.7 % — ABNORMAL HIGH (ref 11.5–15.5)
WBC: 8.4 10*3/uL (ref 4.0–10.5)

## 2022-01-05 LAB — IBC + FERRITIN
Ferritin: 12.5 ng/mL — ABNORMAL LOW (ref 22.0–322.0)
Iron: 55 ug/dL (ref 42–165)
Saturation Ratios: 10.3 % — ABNORMAL LOW (ref 20.0–50.0)
TIBC: 533.4 ug/dL — ABNORMAL HIGH (ref 250.0–450.0)
Transferrin: 381 mg/dL — ABNORMAL HIGH (ref 212.0–360.0)

## 2022-01-05 MED ORDER — DAPAGLIFLOZIN PROPANEDIOL 10 MG PO TABS: 10.0000 mg | ORAL_TABLET | Freq: Every day | ORAL | 1 refills | Status: DC

## 2022-01-07 ENCOUNTER — Other Ambulatory Visit: Payer: Self-pay

## 2022-01-07 DIAGNOSIS — D509 Iron deficiency anemia, unspecified: Secondary | ICD-10-CM

## 2022-01-07 DIAGNOSIS — S92902A Unspecified fracture of left foot, initial encounter for closed fracture: Secondary | ICD-10-CM

## 2022-01-07 HISTORY — DX: Unspecified fracture of left foot, initial encounter for closed fracture: S92.902A

## 2022-01-08 ENCOUNTER — Other Ambulatory Visit: Payer: Self-pay | Admitting: Family Medicine

## 2022-01-08 DIAGNOSIS — D509 Iron deficiency anemia, unspecified: Secondary | ICD-10-CM

## 2022-01-11 ENCOUNTER — Other Ambulatory Visit (INDEPENDENT_AMBULATORY_CARE_PROVIDER_SITE_OTHER): Payer: Medicare PPO

## 2022-01-11 ENCOUNTER — Other Ambulatory Visit: Payer: Self-pay | Admitting: Family Medicine

## 2022-01-11 DIAGNOSIS — R8290 Unspecified abnormal findings in urine: Secondary | ICD-10-CM | POA: Diagnosis not present

## 2022-01-11 DIAGNOSIS — D509 Iron deficiency anemia, unspecified: Secondary | ICD-10-CM

## 2022-01-11 DIAGNOSIS — R809 Proteinuria, unspecified: Secondary | ICD-10-CM

## 2022-01-11 LAB — POCT URINALYSIS DIPSTICK
Bilirubin, UA: NEGATIVE
Glucose, UA: POSITIVE — AB
Ketones, UA: NEGATIVE
Leukocytes, UA: NEGATIVE
Nitrite, UA: NEGATIVE
Protein, UA: POSITIVE — AB
Spec Grav, UA: 1.01 (ref 1.010–1.025)
Urobilinogen, UA: 0.2 U/dL
pH, UA: 5 (ref 5.0–8.0)

## 2022-01-11 LAB — URINALYSIS, MICROSCOPIC ONLY

## 2022-01-12 LAB — PROTEIN / CREATININE RATIO, URINE
Creatinine, Urine: 65 mg/dL (ref 20–320)
Protein/Creat Ratio: 415 mg/g creat — ABNORMAL HIGH (ref 25–148)
Protein/Creatinine Ratio: 0.415 mg/mg creat — ABNORMAL HIGH (ref 0.025–0.148)
Total Protein, Urine: 27 mg/dL — ABNORMAL HIGH (ref 5–25)

## 2022-01-13 ENCOUNTER — Other Ambulatory Visit: Payer: Self-pay | Admitting: Family Medicine

## 2022-01-13 DIAGNOSIS — G8929 Other chronic pain: Secondary | ICD-10-CM

## 2022-01-13 DIAGNOSIS — R809 Proteinuria, unspecified: Secondary | ICD-10-CM

## 2022-01-13 DIAGNOSIS — M5136 Other intervertebral disc degeneration, lumbar region: Secondary | ICD-10-CM

## 2022-01-13 MED ORDER — CYCLOBENZAPRINE HCL 5 MG PO TABS: ORAL_TABLET | ORAL | 1 refills | Status: DC

## 2022-01-13 MED ORDER — GABAPENTIN 300 MG PO CAPS: 300.0000 mg | ORAL_CAPSULE | Freq: Three times a day (TID) | ORAL | 1 refills | Status: DC

## 2022-01-13 NOTE — Telephone Encounter (Signed)
Pt called in requesting refill on medication (gabapentin (NEURONTIN) 300 MG capsule) and (Flexeril)... Pt stated that Dr. Caryl Bis advised him that he will take over once he stop seeing other provider... Pt requesting callback

## 2022-01-18 DIAGNOSIS — S92325A Nondisplaced fracture of second metatarsal bone, left foot, initial encounter for closed fracture: Secondary | ICD-10-CM | POA: Diagnosis not present

## 2022-01-18 DIAGNOSIS — M7989 Other specified soft tissue disorders: Secondary | ICD-10-CM | POA: Diagnosis not present

## 2022-01-18 DIAGNOSIS — S99922A Unspecified injury of left foot, initial encounter: Secondary | ICD-10-CM | POA: Diagnosis not present

## 2022-01-18 DIAGNOSIS — S92322A Displaced fracture of second metatarsal bone, left foot, initial encounter for closed fracture: Secondary | ICD-10-CM | POA: Diagnosis not present

## 2022-01-18 DIAGNOSIS — S92332A Displaced fracture of third metatarsal bone, left foot, initial encounter for closed fracture: Secondary | ICD-10-CM | POA: Diagnosis not present

## 2022-01-18 DIAGNOSIS — S92335B Nondisplaced fracture of third metatarsal bone, left foot, initial encounter for open fracture: Secondary | ICD-10-CM | POA: Diagnosis not present

## 2022-01-19 ENCOUNTER — Inpatient Hospital Stay: Payer: Medicare PPO

## 2022-01-19 ENCOUNTER — Encounter: Payer: Self-pay | Admitting: Internal Medicine

## 2022-01-19 ENCOUNTER — Inpatient Hospital Stay: Payer: Medicare PPO | Attending: Internal Medicine | Admitting: Internal Medicine

## 2022-01-19 ENCOUNTER — Telehealth: Payer: Self-pay | Admitting: Family Medicine

## 2022-01-19 VITALS — BP 105/88 | HR 86 | Temp 97.2°F | Ht 66.0 in | Wt 182.2 lb

## 2022-01-19 DIAGNOSIS — E611 Iron deficiency: Secondary | ICD-10-CM | POA: Diagnosis not present

## 2022-01-19 DIAGNOSIS — N189 Chronic kidney disease, unspecified: Secondary | ICD-10-CM | POA: Insufficient documentation

## 2022-01-19 DIAGNOSIS — Z801 Family history of malignant neoplasm of trachea, bronchus and lung: Secondary | ICD-10-CM | POA: Insufficient documentation

## 2022-01-19 DIAGNOSIS — I130 Hypertensive heart and chronic kidney disease with heart failure and stage 1 through stage 4 chronic kidney disease, or unspecified chronic kidney disease: Secondary | ICD-10-CM | POA: Insufficient documentation

## 2022-01-19 DIAGNOSIS — D649 Anemia, unspecified: Secondary | ICD-10-CM | POA: Diagnosis not present

## 2022-01-19 DIAGNOSIS — E1122 Type 2 diabetes mellitus with diabetic chronic kidney disease: Secondary | ICD-10-CM | POA: Diagnosis not present

## 2022-01-19 DIAGNOSIS — K909 Intestinal malabsorption, unspecified: Secondary | ICD-10-CM

## 2022-01-19 DIAGNOSIS — R5382 Chronic fatigue, unspecified: Secondary | ICD-10-CM | POA: Diagnosis not present

## 2022-01-19 DIAGNOSIS — Z807 Family history of other malignant neoplasms of lymphoid, hematopoietic and related tissues: Secondary | ICD-10-CM | POA: Insufficient documentation

## 2022-01-19 DIAGNOSIS — D509 Iron deficiency anemia, unspecified: Secondary | ICD-10-CM | POA: Diagnosis not present

## 2022-01-19 DIAGNOSIS — Z8041 Family history of malignant neoplasm of ovary: Secondary | ICD-10-CM | POA: Diagnosis not present

## 2022-01-19 DIAGNOSIS — G8929 Other chronic pain: Secondary | ICD-10-CM | POA: Insufficient documentation

## 2022-01-19 DIAGNOSIS — I509 Heart failure, unspecified: Secondary | ICD-10-CM | POA: Insufficient documentation

## 2022-01-19 DIAGNOSIS — Z8042 Family history of malignant neoplasm of prostate: Secondary | ICD-10-CM | POA: Insufficient documentation

## 2022-01-19 NOTE — Progress Notes (Signed)
Had labs at [facility], concerned with UA.

## 2022-01-19 NOTE — Telephone Encounter (Signed)
Pt called stating he fell on Sunday evening in his bedroom. Pt stated he went to [facility] clinic walk in and they stated he broke two bones on the top of his foot just behind his toes. Broke two bones in the middle left foot. Pt is wearing a foot brace and can not put on weight on it. Pt is In a wheelchair and want to know if can take a baby Asprin

## 2022-01-19 NOTE — Assessment & Plan Note (Signed)
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 [PCP; Hb12]-likely secondary to malabsorption.  Proceed with IV iron infusion.  Previously stated iron infusions without any significant side effects.   #Continued chronic fatigue worsened likely secondary to malabsorption/gastric bypass.  Check vitamin D levels; copper and zinc next visit.   # CKD/protneuria [facility] nephrology evaluation]  # Chronic back pain-status post surgery clinically stable.  Thank you Dr. Caryl Bis for allowing me to participate in the care of your pleasant patient. Please do not hesitate to contact me with questions or concerns in the interim.  The above plan of care was discussed with the patient and family in detail.  # DISPOSITION; # no labs today # venofer weekly x3 - Start next week # follow up in 2 months- MD; labs- cbc/bmp; b12; possible IV venofer--Dr.B

## 2022-01-19 NOTE — Progress Notes (Signed)
[facility] CONSULT NOTE  Patient Care Team: [name], MD as PCP - General (Family Medicine) Drema Dallas, DO as Consulting Physician (Neurology) Earna Coder, MD as Consulting Physician (Oncology)  CHIEF COMPLAINTS/PURPOSE OF CONSULTATION: ANEMIA   HEMATOLOGY HISTORY  # ANEMIA [Hb; MCV-platelets- WBC; Iron sat; ferritin;  GFR- CT/US; FEB 2023- EGD [[facility]]/- Evidence of a Roux-en-Y gastrojejunostomy was found. The gastrojejunal anastomosis was characterized by healthy appearing mucosa. The jejunojejunal anastomosis was characterized by healthy appearing mucosa. Previous gastric ulcer has healed. 2019-Colonoscopy   Latest Reference Range & Units 01/05/22 11:13  TIBC 250.0 - 450.0 mcg/dL 811.9 (H)  Saturation Ratios 20.0 - 50.0 % 10.3 (L)  Ferritin 22.0 - 322.0 ng/mL 12.5 (L)  Transferrin 212.0 - 360.0 mg/dL 147.8 (H)  (H): Data is abnormally high (L): Data is abnormally low  # Leucocytosis/weight loss/extreme fatigue-bone marrow biopsy-June 2019-unremarkable.   #Mild macrocytosis without anemia-question copper deficiency/gastric bypass; recommend p.o. copper.   #  SDH [[place]- 2015]; gastric Bypass Surgery    Latest Reference Range & Units 01/05/22 11:13  TIBC 250.0 - 450.0 mcg/dL 295.6 (H)  Saturation Ratios 20.0 - 50.0 % 10.3 (L)  Ferritin 22.0 - 322.0 ng/mL 12.5 (L)  Transferrin 212.0 - 360.0 mg/dL 213.0 (H)  (H): Data is abnormally high (L): Data is abnormally low   No history exists.    Latest Reference Range & Units 01/05/22 11:13  WBC 4.0 - 10.5 K/uL 8.4  RBC 4.22 - 5.81 Mil/uL 4.19 (L)  Hemoglobin 13.0 - 17.0 g/dL 86.5 (L)  HCT 78.4 - 69.6 % 38.3 (L)  MCV 78.0 - 100.0 fl 91.4  MCHC 30.0 - 36.0 g/dL 29.5  RDW 28.4 - 13.2 % 16.7 (H)  Platelets 150.0 - 400.0 K/uL 207.0  (L): Data is abnormally low (H): Data is abnormally high  HISTORY OF PRESENTING ILLNESS: Patient in wheelchair; injured his left foot.  Accompanied by his  wife.  [name] 71 y.o.  male pleasant patient with a history of gastric bypass was been referred to [place] for further evaluation of anemia.  Patient previously evaluated by me for macrocytosis.  No evidence of any acute process.  Patient recently underwent EGD-no evidence of any acute process.    Blood in urine: none Difficulty swallowing: none Liver disease: none Alcohol: wine Bariatric surgery:YES   Review of Systems  Constitutional:  Positive for malaise/fatigue. Negative for chills, diaphoresis, fever and weight loss.  HENT:  Negative for nosebleeds and sore throat.   Eyes:  Negative for double vision.  Respiratory:  Negative for cough, hemoptysis, sputum production, shortness of breath and wheezing.   Cardiovascular:  Negative for chest pain, palpitations, orthopnea and leg swelling.  Gastrointestinal:  Negative for abdominal pain, blood in stool, constipation, diarrhea, heartburn, melena, nausea and vomiting.  Genitourinary:  Negative for dysuria, frequency and urgency.  Musculoskeletal:  Positive for back pain and joint pain.  Skin: Negative.  Negative for itching and rash.  Neurological:  Negative for dizziness, tingling, focal weakness, weakness and headaches.  Endo/Heme/Allergies:  Does not bruise/bleed easily.  Psychiatric/Behavioral:  Negative for depression. The patient is not nervous/anxious and does not have insomnia.      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  removal scheduled to clear  margins.   CHF (congestive heart failure) (HCC)    no longer after weight loss   Chronic kidney disease    cysts   Degenerative disc disease    Depression    Diabetes mellitus    no longer diabetic-or on meds   DJD (degenerative joint disease)    Dysplastic nevus 02/04/2016   R upper back  paraspinal - severe   Family history of adverse reaction to anesthesia    sons wake up combative   Gastric ulcer    GERD (gastroesophageal reflux disease)    H/O hiatal hernia    Headache(784.0)    sinus   Heart murmur    Hx of congestive heart failure    Hyperlipidemia    Hypertension    Iron deficiency anemia 02/28/2015   Lumbar scoliosis    Opiate abuse, continuous (HCC) 06/20/2015   Sacral fracture, closed (HCC)    Seizures (HCC)    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, after GI bleed   Sleep apnea    hx not now since wt loss   Stones in the urinary tract    Syncope and collapse     SURGICAL HISTORY: Past Surgical History:  Procedure Laterality Date   ANTERIOR CERVICAL DECOMP/DISCECTOMY FUSION  01/26/2012   Procedure: ANTERIOR CERVICAL DECOMPRESSION/DISCECTOMY FUSION 3 LEVELS;  Surgeon: [name], MD;  Location: [facility];  Service: Neurosurgery;  Laterality: N/A;  Cervical three-four Cervical four-five Cervical five-six Cervical six-seven , Anterior cervical decompression/diskectomy, fusion, plate   ANTERIOR LAT LUMBAR FUSION Right 04/25/2018   Procedure: Right Lumbar two-three Lumbar three-four Lumbar four-five anterior  lateral interbody fusion  with posterior percutaneous pedicle screws;  Surgeon: [name], MD;  Location: [facility] OR;  Service: Neurosurgery;  Laterality: Right;   APPENDECTOMY     CARDIAC CATHETERIZATION     [facility], normal   CARDIAC CATHETERIZATION     [place]   CHOLECYSTECTOMY     COLONOSCOPY WITH PROPOFOL N/A 12/27/2017   Procedure: COLONOSCOPY WITH PROPOFOL;  Surgeon: Toledo, [name], MD;  Location: [facility];  Service: Gastroenterology;  Laterality: N/A;   ESOPHAGOGASTRODUODENOSCOPY (EGD) WITH PROPOFOL N/A 12/27/2017   Procedure: ESOPHAGOGASTRODUODENOSCOPY (EGD) WITH PROPOFOL;  Surgeon: Toledo, [name], MD;  Location: [facility] END[facility]COPY;  Service: Gastroenterology;  Laterality: N/A;   ESOPHAGOGASTRODUODENOSCOPY (EGD) WITH  PROPOFOL N/A 06/19/2021   Procedure: ESOPHAGOGASTRODUODENOSCOPY (EGD) WITH PROPOFOL;  Surgeon: [name], MD;  Location: [facility];  Service: Gastroenterology;  Laterality: N/A;   ESOPHAGOGASTRODUODENOSCOPY (EGD) WITH PROPOFOL N/A 09/25/2021   Procedure: ESOPHAGOGASTRODUODENOSCOPY (EGD) WITH PROPOFOL;  Surgeon: [name], MD;  Location: [facility] END[facility]COPY;  Service: Endoscopy;  Laterality: N/A;   GASTRIC BYPASS  08/09/2008   [employer]   HERNIA REPAIR  08/09/1998   hiatal   JOINT REPLACEMENT     KNEE ARTHROSCOPY     bilateral, left x 2   LAMINECTOMY WITH POSTERIOR LATERAL ARTHRODESIS LEVEL 4 N/A 01/14/2020   Procedure: Decompressive Laminectomy Lumbar One with pedicle screw fixation from Thoracic Ten to Lumbar Two;  Surgeon: [name], MD;  Location: [facility] OR;  Service: Neurosurgery;  Laterality: N/A;  Decompressive Laminectomy Lumbar One with pedicle screw fixation from Thoracic Ten to Lumbar Two   LUMBAR PERCUTANEOUS PEDICLE SCREW 3 LEVEL N/A 04/25/2018   Procedure: LUMBAR PERCUTANEOUS PEDICLE SCREW 3 LEVEL;  Surgeon: [name], MD;  Location: [facility] OR;  Service: Neurosurgery;  Laterality: N/A;   NASAL SINUS SURGERY     x5   OSTEOTOMY  08/10/1999  left   ROUX-EN-Y GASTRIC BYPASS     TONSILLECTOMY     TOTAL KNEE ARTHROPLASTY Left 05/09/2014   Dr. [name]   UVULOPALATOPLASTY  08/09/2009    SOCIAL HISTORY: Social History   Socioeconomic History   Marital status: Married    Spouse name: Not on file   Number of children: 2   Years of education: Not on file   Highest education level: Not on file  Occupational History   Not on file  Tobacco Use   Smoking status: Never   Smokeless tobacco: Never  Vaping Use   Vaping Use: Never used  Substance and Sexual Activity   Alcohol use: Yes    Comment: occassionally   Drug use: No   Sexual activity: Yes    Partners: Female  Other Topics Concern   Not on file  Social History Narrative   Lives in [place]  with wife, Hoberg. 2 sons, Roderic Ovens, Caryn Bee 30.. 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      Diet - regular diet, limited quantities after gastric bypass      Exercise - no regular, limited by arthritis in knees, occasional water aerobics   Right handed   One story house   Social Determinants of Health   Financial Resource Strain: Low Risk  (06/22/2021)   Overall Financial Resource Strain (CARDIA)    Difficulty of Paying Living Expenses: Not hard at all  Food Insecurity: No Food Insecurity (06/22/2021)   Hunger Vital Sign    Worried About Running Out of Food in the Last Year: Never true    Ran Out of Food in the Last Year: Never true  Transportation Needs: No Transportation Needs (06/22/2021)   PRAPARE - Administrator, Civil Service (Medical): No    Lack of Transportation (Non-Medical): No  Physical Activity: Unknown (06/22/2021)   Exercise Vital Sign    Days of Exercise per Week: 0 days    Minutes of Exercise per Session: Not on file  Stress: No Stress Concern Present (06/22/2021)   [employer] of Occupational Health - Occupational Stress Questionnaire    Feeling of Stress : Not at all  Social Connections: Unknown (06/22/2021)   Social Connection and Isolation Panel [NHANES]    Frequency of Communication with Friends and Family: Not on file    Frequency of Social Gatherings with Friends and Family: More than three times a week    Attends Religious Services: Not on file    Active Member of Clubs or Organizations: Not on file    Attends Banker Meetings: Not on file    Marital Status: Married  Intimate Partner Violence: Not At Risk (06/22/2021)   Humiliation, Afraid, Rape, and Kick questionnaire    Fear of Current or Ex-Partner: No    Emotionally Abused: No    Physically Abused: No    Sexually Abused: No    FAMILY HISTORY: Family History  Problem Relation Age of Onset   Dementia Mother 51    Osteoporosis Mother    Heart disease Father 26   Diabetes Father    Lymphoma Sister        lymphoma, stage 4   Ovarian cancer Sister 55       Ovarian   Lupus Sister    Prostate cancer Maternal Uncle    Skin cancer Maternal Uncle    Tongue cancer Maternal Uncle  uncle died of heart attack   Alcoholism Maternal Uncle    Lung cancer Paternal Aunt        pat aunts x 4 died of lung cancer   Lung cancer Paternal Aunt    Lung cancer Paternal Aunt    Lung cancer Paternal Aunt    Mesothelioma Cousin        paternal cousin    ALLERGIES:  is allergic to altace [ramipril], levaquin [levofloxacin in d5w], ace inhibitors, iodine, levofloxacin, conray [iothalamate], lyrica [pregabalin], metformin, and trulicity [dulaglutide].  MEDICATIONS:  Current Outpatient Medications  Medication Sig Dispense Refill   amLODipine (NORVASC) 10 MG tablet Take 0.5 tablets (5 mg total) by mouth daily. 45 tablet 1   blood glucose meter kit and supplies KIT Dispense based on patient and insurance preference. Check once daily. ICD doing E11.9. 1 each 0   Calcium Carbonate-Vitamin D (CALCIUM 600/VITAMIN D PO) Take 1 tablet by mouth daily.     cyclobenzaprine (FLEXERIL) 5 MG tablet TAKE 1 TABLET BY MOUTH EVERY 8 HOURS AS NEEDED FOR SPASMS. 30 tablet 1   dapagliflozin propanediol (FARXIGA) 10 MG TABS tablet Take 1 tablet (10 mg total) by mouth daily before breakfast. 90 tablet 1   esomeprazole (NEXIUM) 40 MG capsule TAKE 1 CAPSULE BY MOUTH ONCE DAILY (Patient taking differently: in the morning and at bedtime. TAKE 1 CAPSULE BY MOUTH ONCE DAILY) 90 capsule 1   ezetimibe (ZETIA) 10 MG tablet TAKE ONE TABLET BY MOUTH EVERY DAY 90 tablet 1   furosemide (LASIX) 20 MG tablet TAKE ONE TABLET BY MOUTH EVERY DAY 90 tablet 1   gabapentin (NEURONTIN) 300 MG capsule Take 1 capsule (300 mg total) by mouth 3 (three) times daily. 270 capsule 1   metoprolol succinate (TOPROL-XL) 50 MG 24 hr tablet TAKE 1 TABLET BY MOUTH DAILY  WITH OR IMMEDIATLY FOLLOWING A MEAL (Patient taking differently: 25 mg daily.) 90 tablet 1   metroNIDAZOLE (METROGEL) 0.75 % gel Apply 1 application topically 2 (two) times daily. For rosacea - apply to the cheeks, chin, and nose BID. 45 g 3   Multiple Vitamin (MULTIVITAMIN) capsule Take 1 capsule by mouth daily. With copper and zinc     nystatin-triamcinolone ointment (MYCOLOG) Apply 1 application topically 2 (two) times daily. 30 g 0   primidone (MYSOLINE) 50 MG tablet Take 1 tablet (50 mg total) by mouth at bedtime. 30 tablet 11   rosuvastatin (CRESTOR) 40 MG tablet TAKE ONE TABLET EVERY DAY 90 tablet 0   sucralfate (CARAFATE) 1 g tablet TAKE 1 TABLET BY MOUTH 4 TIMES DAILY 360 tablet 1   tamsulosin (FLOMAX) 0.4 MG CAPS capsule TAKE 1 CAPSULE BY MOUTH EVERY DAY 90 capsule 3   TRADJENTA 5 MG TABS tablet TAKE 1 TABLET BY MOUTH DAILY 90 tablet 1   venlafaxine XR (EFFEXOR-XR) 75 MG 24 hr capsule TAKE ONE CAPSULE EVERY MORNING WITH BREAKFAST 90 capsule 1   traMADol (ULTRAM) 50 MG tablet Take 50 mg by mouth every 6 (six) hours as needed.     No current facility-administered medications for this visit.     Marland Kitchen  PHYSICAL EXAMINATION:   Vitals:   01/19/22 1400  BP: 105/88  Pulse: 86  Temp: (!) 97.2 F (36.2 C)  SpO2: 99%   Filed Weights   01/19/22 1400  Weight: 182 lb 3.2 oz (82.6 kg)    Physical Exam Vitals and nursing note reviewed.  HENT:     Head: Normocephalic and atraumatic.  Mouth/Throat:     Pharynx: Oropharynx is clear.  Eyes:     Extraocular Movements: Extraocular movements intact.     Pupils: Pupils are equal, round, and reactive to light.  Cardiovascular:     Rate and Rhythm: Normal rate and regular rhythm.  Pulmonary:     Comments: Decreased breath sounds bilaterally.  Abdominal:     Palpations: Abdomen is soft.  Musculoskeletal:        General: Normal range of motion.     Cervical back: Normal range of motion.  Skin:    General: Skin is warm.   Neurological:     General: No focal deficit present.     Mental Status: He is alert and oriented to person, place, and time.  Psychiatric:        Behavior: Behavior normal.        Judgment: Judgment normal.      LABORATORY DATA:  I have reviewed the data as listed Lab Results  Component Value Date   WBC 8.4 01/05/2022   HGB 12.3 (L) 01/05/2022   HCT 38.3 (L) 01/05/2022   MCV 91.4 01/05/2022   PLT 207.0 01/05/2022   Recent Labs    06/06/21 1821 12/17/21 1731 12/28/21 1020  NA 138 137 137  K 4.1 3.5 4.9  CL 100 106 102  CO2 28 25 29   GLUCOSE 131* 93 130*  BUN 17 27* 20  CREATININE 1.00 1.92* 1.42  CALCIUM 9.5 9.3 8.9  GFRNONAA >60 37*  --   PROT 7.3  --   --   ALBUMIN 4.0  --   --   AST 31  --   --   ALT 31  --   --   ALKPHOS 41  --   --   BILITOT 0.6  --   --      No results found.  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 [PCP; Hb12]-likely secondary to malabsorption.  Proceed with IV iron infusion.  Previously stated iron infusions without any significant side effects.   #Continued chronic fatigue worsened likely secondary to malabsorption/gastric bypass.  Check vitamin D levels; copper and zinc next visit.   # CKD/protneuria [facility] nephrology evaluation]  # Chronic back pain-status post surgery clinically stable.  Thank you Dr. Birdie Sons for allowing me to participate in the care of your pleasant patient. Please do not hesitate to contact me with questions or concerns in the interim.  The above plan of care was discussed with the patient and family in detail.  # DISPOSITION; # no labs today # venofer weekly x3 - Start next week # follow up in 2 months- MD; labs- cbc/bmp; b12; possible IV venofer--Dr.B    All questions were answered. The patient knows to call the clinic with any problems, questions or concerns.    Earna Coder, MD 01/20/2022 9:05 AM

## 2022-01-20 ENCOUNTER — Encounter: Payer: Self-pay | Admitting: Internal Medicine

## 2022-01-20 DIAGNOSIS — M5414 Radiculopathy, thoracic region: Secondary | ICD-10-CM | POA: Diagnosis not present

## 2022-01-20 NOTE — Telephone Encounter (Signed)
Message sent  to [facility] after 2 calls to his mobile number and VM.  Zahriyah Joo,cma

## 2022-01-20 NOTE — Telephone Encounter (Signed)
I called and spoke with the patient and he stated he is not taking aspirin  and he wanted to see because of his injury and  his foot is black and blue and he bruises easily and that why he  is asking he stated he does take Nexium.

## 2022-01-20 NOTE — Telephone Encounter (Signed)
I think it would be ok for him to take an 81 mg aspirin daily particularly given that he is going to be less mobile with the broken bones in his feet.

## 2022-01-20 NOTE — Telephone Encounter (Signed)
Is he currently taking an aspirin? Or is this to see if he can take one with his recent injury? Is he currently taking nexium?

## 2022-01-21 ENCOUNTER — Other Ambulatory Visit: Payer: Self-pay | Admitting: Neurosurgery

## 2022-01-21 DIAGNOSIS — M5414 Radiculopathy, thoracic region: Secondary | ICD-10-CM

## 2022-01-22 ENCOUNTER — Ambulatory Visit
Admission: RE | Admit: 2022-01-22 | Discharge: 2022-01-22 | Disposition: A | Discharge status: Home / Self Care | Payer: Medicare PPO | Source: Ambulatory Visit | Attending: Neurosurgery | Admitting: Neurosurgery

## 2022-01-22 DIAGNOSIS — M5124 Other intervertebral disc displacement, thoracic region: Secondary | ICD-10-CM | POA: Diagnosis not present

## 2022-01-22 DIAGNOSIS — M5414 Radiculopathy, thoracic region: Secondary | ICD-10-CM

## 2022-01-22 MED ORDER — MEPERIDINE HCL 50 MG/ML IJ SOLN
50.0000 mg | Freq: Once | INTRAMUSCULAR | Status: AC | PRN
  Administered 2022-01-22: 50 mg via INTRAMUSCULAR

## 2022-01-22 MED ORDER — DIAZEPAM 5 MG PO TABS
5.0000 mg | ORAL_TABLET | Freq: Once | ORAL | Status: AC
  Administered 2022-01-22: 5 mg via ORAL

## 2022-01-22 MED ORDER — ONDANSETRON HCL 4 MG/2ML IJ SOLN
4.0000 mg | Freq: Once | INTRAMUSCULAR | Status: AC | PRN
Start: 2022-01-22 — End: 2022-01-22
  Administered 2022-01-22: 4 mg via INTRAMUSCULAR

## 2022-01-22 MED ORDER — IOPAMIDOL (ISOVUE-M 300) INJECTION 61%
10.0000 mL | Freq: Once | INTRAMUSCULAR | Status: AC
  Administered 2022-01-22: 10 mL via INTRATHECAL

## 2022-01-22 NOTE — Discharge Instructions (Signed)
Myelogram Discharge Instructions  Go home and rest quietly as needed. You may resume normal activities; however, do not exert yourself strongly or do any heavy lifting today and tomorrow.   DO NOT drive today.    You may resume your normal diet and medications unless otherwise indicated. Drink a lot of extra fluids today and tomorrow.   The incidence of a spinal headache (headache, nausea and/or vomiting is about 5% (one in 20 patients).  If you develop a headache when you are sitting up or standing that gets better when you lie down, please lie flat for 24 hours and drink plenty of fluids until the headache goes away.  Caffeinated beverages may be helpful.   If you develop severe nausea and vomiting or a headache that does not go away with the flat bedrest after 48 hours, please call [phone].   Call your physician for a follow-up appointment.  The results of your myelogram will be sent directly to your physician by the following day.  Please call us at [phone] if you have any questions or if complications develop after you arrive home.   Discharge instructions have been explained to the patient.  The patient, or the person responsible for the patient, state they fully understands these instructions.   Thank you for visiting our office today.   

## 2022-01-28 ENCOUNTER — Inpatient Hospital Stay: Payer: Medicare PPO

## 2022-01-28 VITALS — BP 117/73 | HR 58 | Temp 98.9°F | Resp 16

## 2022-01-28 DIAGNOSIS — N189 Chronic kidney disease, unspecified: Secondary | ICD-10-CM | POA: Diagnosis not present

## 2022-01-28 DIAGNOSIS — E1122 Type 2 diabetes mellitus with diabetic chronic kidney disease: Secondary | ICD-10-CM | POA: Diagnosis not present

## 2022-01-28 DIAGNOSIS — G8929 Other chronic pain: Secondary | ICD-10-CM | POA: Diagnosis not present

## 2022-01-28 DIAGNOSIS — Z801 Family history of malignant neoplasm of trachea, bronchus and lung: Secondary | ICD-10-CM | POA: Diagnosis not present

## 2022-01-28 DIAGNOSIS — Z8042 Family history of malignant neoplasm of prostate: Secondary | ICD-10-CM | POA: Diagnosis not present

## 2022-01-28 DIAGNOSIS — D509 Iron deficiency anemia, unspecified: Secondary | ICD-10-CM | POA: Diagnosis not present

## 2022-01-28 DIAGNOSIS — R5382 Chronic fatigue, unspecified: Secondary | ICD-10-CM | POA: Diagnosis not present

## 2022-01-28 DIAGNOSIS — I509 Heart failure, unspecified: Secondary | ICD-10-CM | POA: Diagnosis not present

## 2022-01-28 DIAGNOSIS — E611 Iron deficiency: Secondary | ICD-10-CM

## 2022-01-28 DIAGNOSIS — I130 Hypertensive heart and chronic kidney disease with heart failure and stage 1 through stage 4 chronic kidney disease, or unspecified chronic kidney disease: Secondary | ICD-10-CM | POA: Diagnosis not present

## 2022-01-28 MED ORDER — SODIUM CHLORIDE 0.9 % IV SOLN: 200.0000 mg | Freq: Once | INTRAVENOUS | Status: DC

## 2022-01-28 MED ORDER — SODIUM CHLORIDE 0.9 % IV SOLN
Freq: Once | INTRAVENOUS | Status: AC
  Filled 2022-01-28: qty 250

## 2022-01-28 MED ORDER — IRON SUCROSE 20 MG/ML IV SOLN
200.0000 mg | Freq: Once | INTRAVENOUS | Status: AC
  Administered 2022-01-28: 200 mg via INTRAVENOUS
  Filled 2022-01-28: qty 10

## 2022-01-28 NOTE — Patient Instructions (Signed)

## 2022-02-01 ENCOUNTER — Ambulatory Visit: Payer: Medicare PPO | Admitting: Family Medicine

## 2022-02-01 ENCOUNTER — Encounter: Payer: Self-pay | Admitting: Family Medicine

## 2022-02-01 VITALS — BP 130/80 | HR 85 | Temp 98.9°F | Ht 66.0 in | Wt 182.0 lb

## 2022-02-01 DIAGNOSIS — S92902A Unspecified fracture of left foot, initial encounter for closed fracture: Secondary | ICD-10-CM

## 2022-02-01 DIAGNOSIS — M5414 Radiculopathy, thoracic region: Secondary | ICD-10-CM | POA: Insufficient documentation

## 2022-02-01 DIAGNOSIS — K219 Gastro-esophageal reflux disease without esophagitis: Secondary | ICD-10-CM | POA: Diagnosis not present

## 2022-02-01 DIAGNOSIS — I10 Essential (primary) hypertension: Secondary | ICD-10-CM | POA: Diagnosis not present

## 2022-02-01 DIAGNOSIS — E611 Iron deficiency: Secondary | ICD-10-CM

## 2022-02-01 DIAGNOSIS — E119 Type 2 diabetes mellitus without complications: Secondary | ICD-10-CM

## 2022-02-02 DIAGNOSIS — S92909A Unspecified fracture of unspecified foot, initial encounter for closed fracture: Secondary | ICD-10-CM | POA: Insufficient documentation

## 2022-02-02 HISTORY — DX: Unspecified fracture of unspecified foot, initial encounter for closed fracture: S92.909A

## 2022-02-02 NOTE — Progress Notes (Signed)
I sent the e-mail to the appropriate person, I have the instructions.  Sacora Hawbaker,cma

## 2022-02-02 NOTE — Assessment & Plan Note (Signed)
He will remain in the boot. He will see podiatry as planned.

## 2022-02-03 ENCOUNTER — Other Ambulatory Visit: Payer: Self-pay | Admitting: Family Medicine

## 2022-02-03 DIAGNOSIS — S92332D Displaced fracture of third metatarsal bone, left foot, subsequent encounter for fracture with routine healing: Secondary | ICD-10-CM | POA: Diagnosis not present

## 2022-02-03 DIAGNOSIS — S92345D Nondisplaced fracture of fourth metatarsal bone, left foot, subsequent encounter for fracture with routine healing: Secondary | ICD-10-CM | POA: Diagnosis not present

## 2022-02-03 DIAGNOSIS — S92322D Displaced fracture of second metatarsal bone, left foot, subsequent encounter for fracture with routine healing: Secondary | ICD-10-CM | POA: Diagnosis not present

## 2022-02-04 ENCOUNTER — Inpatient Hospital Stay: Payer: Medicare PPO

## 2022-02-04 VITALS — BP 91/67 | HR 86 | Temp 96.9°F

## 2022-02-04 DIAGNOSIS — N189 Chronic kidney disease, unspecified: Secondary | ICD-10-CM | POA: Diagnosis not present

## 2022-02-04 DIAGNOSIS — E1122 Type 2 diabetes mellitus with diabetic chronic kidney disease: Secondary | ICD-10-CM | POA: Diagnosis not present

## 2022-02-04 DIAGNOSIS — Z8042 Family history of malignant neoplasm of prostate: Secondary | ICD-10-CM | POA: Diagnosis not present

## 2022-02-04 DIAGNOSIS — R5382 Chronic fatigue, unspecified: Secondary | ICD-10-CM | POA: Diagnosis not present

## 2022-02-04 DIAGNOSIS — I130 Hypertensive heart and chronic kidney disease with heart failure and stage 1 through stage 4 chronic kidney disease, or unspecified chronic kidney disease: Secondary | ICD-10-CM | POA: Diagnosis not present

## 2022-02-04 DIAGNOSIS — D509 Iron deficiency anemia, unspecified: Secondary | ICD-10-CM | POA: Diagnosis not present

## 2022-02-04 DIAGNOSIS — Z801 Family history of malignant neoplasm of trachea, bronchus and lung: Secondary | ICD-10-CM | POA: Diagnosis not present

## 2022-02-04 DIAGNOSIS — G8929 Other chronic pain: Secondary | ICD-10-CM | POA: Diagnosis not present

## 2022-02-04 DIAGNOSIS — I509 Heart failure, unspecified: Secondary | ICD-10-CM | POA: Diagnosis not present

## 2022-02-04 DIAGNOSIS — E611 Iron deficiency: Secondary | ICD-10-CM

## 2022-02-04 MED ORDER — SODIUM CHLORIDE 0.9 % IV SOLN: 200.0000 mg | Freq: Once | INTRAVENOUS | Status: DC

## 2022-02-04 MED ORDER — IRON SUCROSE 20 MG/ML IV SOLN
200.0000 mg | Freq: Once | INTRAVENOUS | Status: AC
  Administered 2022-02-04: 200 mg via INTRAVENOUS
  Filled 2022-02-04: qty 10

## 2022-02-04 MED ORDER — SODIUM CHLORIDE 0.9 % IV SOLN
Freq: Once | INTRAVENOUS | Status: AC
  Filled 2022-02-04: qty 250

## 2022-02-04 NOTE — Patient Instructions (Signed)
[facility]  Discharge Instructions: Thank you for choosing [facility] to provide your oncology and hematology care.  If you have a lab appointment with the Cancer Center, please go directly to the Cancer Center and check in at the registration area.  Wear comfortable clothing and clothing appropriate for easy access to any Portacath or PICC line.   We strive to give you quality time with your provider. You may need to reschedule your appointment if you arrive late (15 or more minutes).  Arriving late affects you and other patients whose appointments are after yours.  Also, if you miss three or more appointments without notifying the office, you may be dismissed from the clinic at the provider's discretion.      For prescription refill requests, have your pharmacy contact our office and allow 72 hours for refills to be completed.    Today you received the following chemotherapy and/or immunotherapy agents VENOFER      To help prevent nausea and vomiting after your treatment, we encourage you to take your nausea medication as directed.  BELOW ARE SYMPTOMS THAT SHOULD BE REPORTED IMMEDIATELY: *FEVER GREATER THAN 100.4 F (38 C) OR HIGHER *CHILLS OR SWEATING *NAUSEA AND VOMITING THAT IS NOT CONTROLLED WITH YOUR NAUSEA MEDICATION *UNUSUAL SHORTNESS OF BREATH *UNUSUAL BRUISING OR BLEEDING *URINARY PROBLEMS (pain or burning when urinating, or frequent urination) *BOWEL PROBLEMS (unusual diarrhea, constipation, pain near the anus) TENDERNESS IN MOUTH AND THROAT WITH OR WITHOUT PRESENCE OF ULCERS (sore throat, sores in mouth, or a toothache) UNUSUAL RASH, SWELLING OR PAIN  UNUSUAL VAGINAL DISCHARGE OR ITCHING   Items with * indicate a potential emergency and should be followed up as soon as possible or go to the Emergency Department if any problems should occur.  Please show the CHEMOTHERAPY ALERT CARD or IMMUNOTHERAPY ALERT CARD at check-in to the  Emergency Department and triage nurse.  Should you have questions after your visit or need to cancel or reschedule your appointment, please contact [facility]  [phone] and follow the prompts.  Office hours are 8:00 a.m. to 4:30 p.m. Monday - Friday. Please note that voicemails left after 4:00 p.m. may not be returned until the following business day.  We are closed weekends and major holidays. You have access to a nurse at all times for urgent questions. Please call the main number to the clinic [phone] and follow the prompts.  For any non-urgent questions, you may also contact your provider using MyChart. We now offer e-Visits for anyone 18 and older to request care online for non-urgent symptoms. For details visit [email or web address].   Also download the MyChart app! Go to the app store, search "MyChart", open the app, select [facility], and log in with your MyChart username and password.  Masks are optional in the cancer centers. If you would like for your care team to wear a mask while they are taking care of you, please let them know. For doctor visits, patients may have with them one support person who is at least 71 years old. At this time, visitors are not allowed in the infusion area.   Iron Sucrose Injection What is this medication? IRON SUCROSE (EYE ern SOO krose) treats low levels of iron (iron deficiency anemia) in people with kidney disease. Iron is a mineral that plays an important role in making red blood cells, which carry oxygen from your lungs to the rest of your body. This medicine may   be used for other purposes; ask your health care provider or pharmacist if you have questions. COMMON BRAND NAME(S): Venofer What should I tell my care team before I take this medication? They need to know if you have any of these conditions: Anemia not caused by low iron levels Heart disease High levels of iron in the blood Kidney disease Liver  disease An unusual or allergic reaction to iron, other medications, foods, dyes, or preservatives Pregnant or trying to get pregnant Breast-feeding How should I use this medication? This medication is for infusion into a vein. It is given in a hospital or clinic setting. Talk to your care team about the use of this medication in children. While this medication may be prescribed for children as young as 2 years for selected conditions, precautions do apply. Overdosage: If you think you have taken too much of this medicine contact a poison control center or emergency room at once. NOTE: This medicine is only for you. Do not share this medicine with others. What if I miss a dose? It is important not to miss your dose. Call your care team if you are unable to keep an appointment. What may interact with this medication? Do not take this medication with any of the following: Deferoxamine Dimercaprol Other iron products This medication may also interact with the following: Chloramphenicol Deferasirox This list may not describe all possible interactions. Give your health care provider a list of all the medicines, herbs, non-prescription drugs, or dietary supplements you use. Also tell them if you smoke, drink alcohol, or use illegal drugs. Some items may interact with your medicine. What should I watch for while using this medication? Visit your care team regularly. Tell your care team if your symptoms do not start to get better or if they get worse. You may need blood work done while you are taking this medication. You may need to follow a special diet. Talk to your care team. Foods that contain iron include: whole grains/cereals, dried fruits, beans, or peas, leafy green vegetables, and organ meats (liver, kidney). What side effects may I notice from receiving this medication? Side effects that you should report to your care team as soon as possible: Allergic reactions--skin rash, itching, hives,  swelling of the face, lips, tongue, or throat Low blood pressure--dizziness, feeling faint or lightheaded, blurry vision Shortness of breath Side effects that usually do not require medical attention (report to your care team if they continue or are bothersome): Flushing Headache Joint pain Muscle pain Nausea Pain, redness, or irritation at injection site This list may not describe all possible side effects. Call your doctor for medical advice about side effects. You may report side effects to FDA at 1-800-FDA-1088. Where should I keep my medication? This medication is given in a hospital or clinic and will not be stored at home. NOTE: This sheet is a summary. It may not cover all possible information. If you have questions about this medicine, talk to your doctor, pharmacist, or health care provider.  2023 Elsevier/Gold Standard (2020-12-19 00:00:00)   

## 2022-02-08 DIAGNOSIS — S92902D Unspecified fracture of left foot, subsequent encounter for fracture with routine healing: Secondary | ICD-10-CM | POA: Diagnosis not present

## 2022-02-10 ENCOUNTER — Telehealth: Payer: Self-pay | Admitting: Pharmacist

## 2022-02-10 NOTE — Progress Notes (Signed)
[facility]) Care Management  [place]   02/10/2022  Justin Patel 1951-03-15 [id number]   Reason for referral: medication assistance  Referral source: Dr. Ellen Henri Office Current insurance:[employer] PPO  HPI:   Type 2 diabetes, Anxiety, aortic atherosclerosis,degenerative disc disease cervical, GERD, hyperlipidemia, osteoporosis, hypertension.   Objective: Lab Results  Component Value Date   CREATININE 1.42 12/28/2021   CREATININE 1.92 (H) 12/17/2021   CREATININE 1.00 06/06/2021    Lab Results  Component Value Date   HGBA1C 7.3 (H) 12/28/2021    Lipid Panel     Component Value Date/Time   CHOL 157 04/06/2021 1629   CHOL 171 03/14/2019 0000   TRIG 102.0 04/06/2021 1629   HDL 60.90 04/06/2021 1629   HDL 64 03/14/2019 0000   CHOLHDL 3 04/06/2021 1629   VLDL 20.4 04/06/2021 1629   LDLCALC 76 04/06/2021 1629   LDLCALC 77 03/14/2019 0000   LDLDIRECT 70.0 05/27/2021 0948    BP Readings from Last 3 Encounters:  02/04/22 91/67  02/01/22 130/80  01/28/22 117/73    Allergies  Allergen Reactions   Altace [Ramipril] Anaphylaxis   Ace Inhibitors Hives   Levaquin [Levofloxacin In D5w] Hives   Lyrica [Pregabalin] Other (See Comments)    Edema   Metformin Diarrhea    High doses cause diarrhea.    Trulicity [Dulaglutide] Nausea And Vomiting    Medications Reviewed Today     Reviewed by Elayne Guerin, [facility] (Pharmacist) on 02/10/22 at [place] List Status: <None>   Medication Order Taking? Sig Documenting Provider Last Dose Status Informant  amLODipine (NORVASC) 10 MG tablet [id number] Yes Take 0.5 tablets (5 mg total) by mouth daily. [name], MD Taking Active   blood glucose meter kit and supplies KIT [id number] Yes Dispense based on patient and insurance preference. Check once daily. ICD doing E11.9. [name], MD Taking Active Self  Calcium Carbonate-Vitamin D (CALCIUM 600/VITAMIN D PO) [id number] Yes Take 1 tablet  by mouth daily. [provider] Taking Active Self  cyclobenzaprine (FLEXERIL) 5 MG tablet [id number] Yes TAKE 1 TABLET BY MOUTH EVERY 8 HOURS AS NEEDED FOR SPASMS. [name], MD Taking Active   dapagliflozin propanediol (FARXIGA) 10 MG TABS tablet [id number] Yes Take 1 tablet (10 mg total) by mouth daily before breakfast. [name], MD Taking Active   esomeprazole ([place]) 40 MG capsule [id number] Yes TAKE 1 CAPSULE BY MOUTH ONCE DAILY  Patient taking differently: in the morning and at bedtime. TAKE 1 CAPSULE BY MOUTH ONCE DAILY   [name], MD Taking Active   ezetimibe (ZETIA) 10 MG tablet [id number] Yes TAKE ONE TABLET BY MOUTH EVERY DAY [name], MD Taking Active   furosemide (LASIX) 20 MG tablet [id number] Yes TAKE ONE TABLET BY MOUTH EVERY DAY [name], MD Taking Active   gabapentin (NEURONTIN) 300 MG capsule [id number] Yes Take 1 capsule (300 mg total) by mouth 3 (three) times daily. [name], MD Taking Active   metoprolol succinate (TOPROL-XL) 50 MG 24 hr tablet [id number] Yes TAKE 1 TABLET BY MOUTH DAILY WITH OR IMMEDIATLY FOLLOWING A MEAL  Patient taking differently: 25 mg daily.   [name], MD Taking Active   metroNIDAZOLE (METROGEL) 0.75 % gel [id number] Yes Apply 1 application topically 2 (two) times daily. For rosacea - apply to the cheeks, chin, and nose BID. Ralene Bathe, MD Taking Active   Multiple Vitamin (MULTIVITAMIN) capsule [id number] Yes Take 1  capsule by mouth daily. With copper and zinc [name], MD Taking Active Self  nystatin-triamcinolone ointment [facility]) [id number] Yes Apply 1 application topically 2 (two) times daily. [name], MD Taking Active   primidone (MYSOLINE) 50 MG tablet [id number] Yes Take 1 tablet (50 mg total) by mouth at bedtime. Pieter Partridge, DO Taking Active   rosuvastatin (CRESTOR) 40 MG tablet [id number] Yes TAKE ONE TABLET EVERY DAY Wellington Hampshire, MD Taking  Active   sucralfate (CARAFATE) 1 g tablet [id number] Yes TAKE 1 TABLET BY MOUTH 4 TIMES DAILY [name], MD Taking Active   tamsulosin (FLOMAX) 0.4 MG CAPS capsule [id number] Yes TAKE 1 CAPSULE BY MOUTH EVERY DAY [name], MD Taking Active   TRADJENTA 5 MG TABS tablet [id number] Yes TAKE 1 TABLET BY MOUTH DAILY [name], MD Taking Active   traMADol (ULTRAM) 50 MG tablet [id number] Yes TAKE 1-2 TABLETS BY MOUTH EVERY 4-6 HOURS AS NEEDED Dutch Quint B, FNP Taking Active   venlafaxine XR (EFFEXOR-XR) 75 MG 24 hr capsule [id number] Yes TAKE ONE CAPSULE EVERY MORNING WITH BREAKFAST [name], MD Taking Active   Med List Note Caryl Bis, Angela Adam, MD 03/21/19 1320):  UDS 11/25/15 (red ink)  Meds to last until 05/16/2016             ASSESSMENT: Patient expressed concern affording Tradjenta and Farxiga.  After initial financial review, he is over income for LIS through Medicare but may qualify for patient assistance programing for Tradjenta [employer]) and [place] (AZ&Me). He communicated understanding about the necessary financial information.    With most recent ADA guidelines, GLP-1s may be preferred over DPPIV inhibitors due to better glycemic control and more weight loss--unless there is a contraindication. (Ozempic is available through [employer])  [employer], CPhT will send patient and provider the respective forms for signature and coordinate with the programs.   Plan: Route letter to jill to complete the patient assistance process.  (Entire process takes 6-8 weeks--provided all forms are received).  Elayne Guerin, PharmD, [place] Clinical Pharmacist [phone]

## 2022-02-11 ENCOUNTER — Telehealth: Payer: Self-pay

## 2022-02-11 ENCOUNTER — Inpatient Hospital Stay: Payer: Medicare PPO | Attending: Internal Medicine

## 2022-02-11 ENCOUNTER — Telehealth: Payer: Self-pay | Admitting: Family Medicine

## 2022-02-11 VITALS — BP 94/56 | HR 90 | Temp 96.0°F

## 2022-02-11 DIAGNOSIS — D508 Other iron deficiency anemias: Secondary | ICD-10-CM | POA: Diagnosis not present

## 2022-02-11 DIAGNOSIS — E611 Iron deficiency: Secondary | ICD-10-CM

## 2022-02-11 MED ORDER — SODIUM CHLORIDE 0.9 % IV SOLN
200.0000 mg | Freq: Once | INTRAVENOUS | Status: AC
  Administered 2022-02-11: 200 mg via INTRAVENOUS
  Filled 2022-02-11: qty 10

## 2022-02-11 MED ORDER — SODIUM CHLORIDE 0.9 % IV SOLN
Freq: Once | INTRAVENOUS | Status: AC
  Filled 2022-02-11: qty 250

## 2022-02-11 NOTE — Progress Notes (Signed)
Patient tolerated Venofer infusion well. See telephone note. Patient discharged. AVS given.

## 2022-02-11 NOTE — Telephone Encounter (Signed)
Patient here for Venofer infusion, BP 94/56. Patient asymptomatic. States its always low and PCP advised him to cut BP pill in half. Called informed patient PCP, per Caryl Pina at [facility] will reach out to patient. Hematologist notified.

## 2022-02-11 NOTE — Telephone Encounter (Signed)
Nikki from cancer center called stating pt BP was low 93/50. Pt stated provider told him to cut  his medication in half, but pt want to stop it completely because its giving him a headache. Lexine Baton wanted the provider to call him

## 2022-02-11 NOTE — Patient Instructions (Signed)

## 2022-02-12 NOTE — Addendum Note (Signed)
Addended by: [name] on: 02/12/2022 09:49 AM   Modules accepted: Orders

## 2022-02-12 NOTE — Telephone Encounter (Signed)
Noted. He can discontinue the amlodipine. He should be scheduled for a nurse BP check in 3-4 weeks to see how his BP is doing after stopping the amlodipine.

## 2022-02-12 NOTE — Telephone Encounter (Signed)
I called the patient and informed him that the provider wants him to stop taking the amlodipine.  He understood and is scheduled to check his BP in 1 month.  Lanell Carpenter,cma

## 2022-02-16 ENCOUNTER — Telehealth: Payer: Self-pay | Admitting: Pharmacy Technician

## 2022-02-16 DIAGNOSIS — Z596 Low income: Secondary | ICD-10-CM

## 2022-02-16 NOTE — Progress Notes (Signed)
[facility])                                            [place] Team    02/16/2022  KASS HERBERGER [birth year] [id number]                                      Medication Assistance Referral  Referral From: [place]  Medication/Company: [employer] [place] / AZ&ME Patient application portion:  Mailed Provider application portion: Faxed  to Dr. [name] Provider address/fax verified via: Office website  Medication/Company: [employer] / BI Patient application portion:  Mailed Provider application portion: Faxed  to Dr. [name] Provider address/fax verified via: Office website   Nishaan Stanke P. Tekeisha Hakim, [place]  [phone]

## 2022-02-17 DIAGNOSIS — N1831 Chronic kidney disease, stage 3a: Secondary | ICD-10-CM | POA: Diagnosis not present

## 2022-02-17 DIAGNOSIS — R809 Proteinuria, unspecified: Secondary | ICD-10-CM | POA: Diagnosis not present

## 2022-02-17 DIAGNOSIS — R8290 Unspecified abnormal findings in urine: Secondary | ICD-10-CM | POA: Diagnosis not present

## 2022-02-17 DIAGNOSIS — E1122 Type 2 diabetes mellitus with diabetic chronic kidney disease: Secondary | ICD-10-CM | POA: Diagnosis not present

## 2022-02-17 DIAGNOSIS — T8484XA Pain due to internal orthopedic prosthetic devices, implants and grafts, initial encounter: Secondary | ICD-10-CM | POA: Diagnosis not present

## 2022-02-18 ENCOUNTER — Encounter: Payer: Self-pay | Admitting: Internal Medicine

## 2022-02-19 ENCOUNTER — Other Ambulatory Visit: Payer: Self-pay | Admitting: Neurosurgery

## 2022-02-24 ENCOUNTER — Other Ambulatory Visit: Payer: Self-pay | Admitting: Family

## 2022-02-24 DIAGNOSIS — S92345D Nondisplaced fracture of fourth metatarsal bone, left foot, subsequent encounter for fracture with routine healing: Secondary | ICD-10-CM | POA: Diagnosis not present

## 2022-02-24 DIAGNOSIS — S92322D Displaced fracture of second metatarsal bone, left foot, subsequent encounter for fracture with routine healing: Secondary | ICD-10-CM | POA: Diagnosis not present

## 2022-02-24 DIAGNOSIS — S92332D Displaced fracture of third metatarsal bone, left foot, subsequent encounter for fracture with routine healing: Secondary | ICD-10-CM | POA: Diagnosis not present

## 2022-03-02 ENCOUNTER — Telehealth: Payer: Self-pay

## 2022-03-02 ENCOUNTER — Telehealth: Payer: Self-pay | Admitting: Pharmacy Technician

## 2022-03-02 DIAGNOSIS — Z596 Low income: Secondary | ICD-10-CM

## 2022-03-02 NOTE — Progress Notes (Addendum)
[facility])                                            [place] Team    03/02/2022  Justin Patel 10/15/1950 [id number]  Received both patient and provider portion(s) of patient assistance application(s) for [place]. Faxed completed application and required documents into AZ&ME.  Also, successful outgoing call placed to patient in regard to [place] application with [employer]. HIPAA verified. Inquired if patient had mailed back that application. Patient did not realize it was 2 different applications. He informs he will mail back this week. Once received will put with provider's portion and submit to [place]. [name], [place]  [phone]

## 2022-03-02 NOTE — Telephone Encounter (Signed)
Faxed the form to [place] today,. Confirmation given.  Helyn Schwan,cma

## 2022-03-02 NOTE — Telephone Encounter (Signed)
-----   Message from Earlyne Iba, [place] sent at 03/02/2022 12:02 PM EDT ----- Regarding: FW: medication assistance application Have yo seen this Gae Bon? ----- Message ----- From: Jason Fila, CPhT Sent: 03/01/2022   9:48 AM EDT To: Earlyne Iba, CMA; Gordy Councilman, CMA Subject: medication assistance application              Hi!  We received a referral that patient needed medication assistance for Farxiga with AZ&ME and Tradjenta with BI.  We faxed over the provider's portion of the applications. However, we only received 1 of the 2 back. We are still in need of the AZ&ME provider portion of [place]. Could you confirm if you have that form or if we need to fax it over again. If you have the form, please let us know if you have any questions about it.  Thanks,  Luiz Ochoa. [place], [place]  [phone]-Fax

## 2022-03-03 NOTE — Pre-Procedure Instructions (Signed)
Surgical Instructions    Your procedure is scheduled on March 08, 2022.  Report to [facility] Main Entrance "A" at 6:00 A.M., then check in with the Admitting office.  Call this number if you have problems the morning of surgery:  [phone]   If you have any questions prior to your surgery date call [phone]: Open Monday-Friday 8am-4pm    Remember:  Do not eat or drink after midnight the night before your surgery     Take these medicines the morning of surgery with A SIP OF WATER:  esomeprazole (NEXIUM)  ezetimibe (ZETIA)   gabapentin (NEURONTIN)  metoprolol succinate (TOPROL-XL)   rosuvastatin (CRESTOR)  sucralfate (CARAFATE)   tamsulosin (FLOMAX)  venlafaxine XR (EFFEXOR-XR)   Take these medicines the morning of surgery AS NEEDED:  acetaminophen (TYLENOL)  cyclobenzaprine (FLEXERIL)  traMADol (ULTRAM)    As of today, STOP taking any Aspirin (unless otherwise instructed by your surgeon) Aleve, Naproxen, Ibuprofen, Motrin, Advil, Goody's, BC's, all herbal medications, fish oil, and all vitamins.  WHAT DO I DO ABOUT MY DIABETES MEDICATION?   Do not take TRADJENTA the morning of surgery.  STOP taking dapagliflozin propanediol (FARXIGA) three days prior to surgery.     The day of surgery, do not take other diabetes injectables, including Byetta (exenatide), Bydureon (exenatide ER), Victoza (liraglutide), or Trulicity (dulaglutide).    HOW TO MANAGE YOUR DIABETES BEFORE AND AFTER SURGERY  Why is it important to control my blood sugar before and after surgery? Improving blood sugar levels before and after surgery helps healing and can limit problems. A way of improving blood sugar control is eating a healthy diet by:  Eating less sugar and carbohydrates  Increasing activity/exercise  Talking with your doctor about reaching your blood sugar goals High blood sugars (greater than 180 mg/dL) can raise your risk of infections and slow your recovery, so you will need  to focus on controlling your diabetes during the weeks before surgery. Make sure that the doctor who takes care of your diabetes knows about your planned surgery including the date and location.  How do I manage my blood sugar before surgery? Check your blood sugar at least 4 times a day, starting 2 days before surgery, to make sure that the level is not too high or low.  Check your blood sugar the morning of your surgery when you wake up and every 2 hours until you get to the Short Stay unit.  If your blood sugar is less than 70 mg/dL, you will need to treat for low blood sugar: Do not take insulin. Treat a low blood sugar (less than 70 mg/dL) with  cup of clear juice (cranberry or apple), 4 glucose tablets, OR glucose gel. Recheck blood sugar in 15 minutes after treatment (to make sure it is greater than 70 mg/dL). If your blood sugar is not greater than 70 mg/dL on recheck, call [phone] for further instructions. Report your blood sugar to the short stay nurse when you get to Short Stay.  If you are admitted to the hospital after surgery: Your blood sugar will be checked by the staff and you will probably be given insulin after surgery (instead of oral diabetes medicines) to make sure you have good blood sugar levels. The goal for blood sugar control after surgery is 80-180 mg/dL.                      Do NOT Smoke (Tobacco/Vaping) for 24 hours prior to your  procedure.  If you use a CPAP at night, you may bring your mask/headgear for your overnight stay.   Contacts, glasses, piercing's, hearing aid's, dentures or partials may not be worn into surgery, please bring cases for these belongings.    For patients admitted to the hospital, discharge time will be determined by your treatment team.   Patients discharged the day of surgery will not be allowed to drive home, and someone needs to stay with them for 24 hours.  SURGICAL WAITING ROOM VISITATION Patients having surgery or a  procedure may have no more than 2 support people in the waiting area - these visitors may rotate.   Children under the age of 58 must have an adult with them who is not the patient. If the patient needs to stay at the hospital during part of their recovery, the visitor guidelines for inpatient rooms apply. Pre-op nurse will coordinate an appropriate time for 1 support person to accompany patient in pre-op.  This support person may not rotate.   Please refer to the [facility] website for the visitor guidelines for Inpatients (after your surgery is over and you are in a regular room).    Special instructions:   [place]- Preparing For Surgery  Before surgery, you can play an important role. Because skin is not sterile, your skin needs to be as free of germs as possible. You can reduce the number of germs on your skin by washing with CHG (chlorahexidine gluconate) Soap before surgery.  CHG is an antiseptic cleaner which kills germs and bonds with the skin to continue killing germs even after washing.    Oral Hygiene is also important to reduce your risk of infection.  Remember - BRUSH YOUR TEETH THE MORNING OF SURGERY WITH YOUR REGULAR TOOTHPASTE  Please do not use if you have an allergy to CHG or antibacterial soaps. If your skin becomes reddened/irritated stop using the CHG.  Do not shave (including legs and underarms) for at least 48 hours prior to first CHG shower. It is OK to shave your face.  Please follow these instructions carefully.   Shower the NIGHT BEFORE SURGERY and the MORNING OF SURGERY  If you chose to wash your hair, wash your hair first as usual with your normal shampoo.  After you shampoo, rinse your hair and body thoroughly to remove the shampoo.  Use CHG Soap as you would any other liquid soap. You can apply CHG directly to the skin and wash gently with a scrungie or a clean washcloth.   Apply the CHG Soap to your body ONLY FROM THE NECK DOWN.  Do not use on open  wounds or open sores. Avoid contact with your eyes, ears, mouth and genitals (private parts). Wash Face and genitals (private parts)  with your normal soap.   Wash thoroughly, paying special attention to the area where your surgery will be performed.  Thoroughly rinse your body with warm water from the neck down.  DO NOT shower/wash with your normal soap after using and rinsing off the CHG Soap.  Pat yourself dry with a CLEAN TOWEL.  Wear CLEAN PAJAMAS to bed the night before surgery  Place CLEAN SHEETS on your bed the night before your surgery  DO NOT SLEEP WITH PETS.   Day of Surgery: Take a shower with CHG soap. Do not wear jewelry or makeup Do not wear lotions, powders, perfumes/colognes, or deodorant. Do not shave 48 hours prior to surgery.  Men may shave face and  neck. Do not bring valuables to the hospital.  [facility] is not responsible for any belongings or valuables. Do not wear nail polish, gel polish, artificial nails, or any other type of covering on natural nails (fingers and toes) If you have artificial nails or gel coating that need to be removed by a nail salon, please have this removed prior to surgery. Artificial nails or gel coating may interfere with anesthesia's ability to adequately monitor your vital signs. Wear Clean/Comfortable clothing the morning of surgery Remember to brush your teeth WITH YOUR REGULAR TOOTHPASTE.   Please read over the following fact sheets that you were given.    If you received a COVID test during your pre-op visit  it is requested that you wear a mask when out in public, stay away from anyone that may not be feeling well and notify your surgeon if you develop symptoms. If you have been in contact with anyone that has tested positive in the last 10 days please notify you surgeon.

## 2022-03-04 ENCOUNTER — Other Ambulatory Visit: Payer: Self-pay

## 2022-03-04 ENCOUNTER — Telehealth: Payer: Self-pay

## 2022-03-04 ENCOUNTER — Encounter (HOSPITAL_COMMUNITY): Payer: Self-pay

## 2022-03-04 ENCOUNTER — Encounter (HOSPITAL_COMMUNITY)
Admission: RE | Admit: 2022-03-04 | Discharge: 2022-03-04 | Disposition: A | Discharge status: Home / Self Care | Payer: Medicare PPO | Source: Ambulatory Visit | Attending: Neurosurgery | Admitting: Neurosurgery

## 2022-03-04 VITALS — BP 125/90 | HR 105 | Temp 98.7°F | Resp 17 | Ht 65.0 in | Wt 184.9 lb

## 2022-03-04 DIAGNOSIS — I509 Heart failure, unspecified: Secondary | ICD-10-CM | POA: Insufficient documentation

## 2022-03-04 DIAGNOSIS — E119 Type 2 diabetes mellitus without complications: Secondary | ICD-10-CM | POA: Insufficient documentation

## 2022-03-04 DIAGNOSIS — Z01812 Encounter for preprocedural laboratory examination: Secondary | ICD-10-CM | POA: Insufficient documentation

## 2022-03-04 DIAGNOSIS — Z01818 Encounter for other preprocedural examination: Secondary | ICD-10-CM

## 2022-03-04 LAB — HEMOGLOBIN A1C
Hgb A1c MFr Bld: 6.4 % — ABNORMAL HIGH (ref 4.8–5.6)
Mean Plasma Glucose: 136.98 mg/dL

## 2022-03-04 LAB — BASIC METABOLIC PANEL
Anion gap: 11 (ref 5–15)
BUN: 30 mg/dL — ABNORMAL HIGH (ref 8–23)
CO2: 26 mmol/L (ref 22–32)
Calcium: 9.4 mg/dL (ref 8.9–10.3)
Chloride: 103 mmol/L (ref 98–111)
Creatinine, Ser: 1.36 mg/dL — ABNORMAL HIGH (ref 0.61–1.24)
GFR, Estimated: 56 mL/min — ABNORMAL LOW (ref >60)
Glucose, Bld: 100 mg/dL — ABNORMAL HIGH (ref 70–99)
Potassium: 4.9 mmol/L (ref 3.5–5.1)
Sodium: 140 mmol/L (ref 135–145)

## 2022-03-04 LAB — CBC
HCT: 41.1 % (ref 39.0–52.0)
Hemoglobin: 13.1 g/dL (ref 13.0–17.0)
MCH: 31.2 pg (ref 26.0–34.0)
MCHC: 31.9 g/dL (ref 30.0–36.0)
MCV: 97.9 fL (ref 80.0–100.0)
Platelets: 261 10*3/uL (ref 150–400)
RBC: 4.2 MIL/uL — ABNORMAL LOW (ref 4.22–5.81)
RDW: 18.9 % — ABNORMAL HIGH (ref 11.5–15.5)
WBC: 11.6 10*3/uL — ABNORMAL HIGH (ref 4.0–10.5)
nRBC: 0 % (ref 0.0–0.2)

## 2022-03-04 LAB — SURGICAL PCR SCREEN
MRSA, PCR: NEGATIVE
Staphylococcus aureus: POSITIVE — AB

## 2022-03-04 LAB — GLUCOSE, CAPILLARY: Glucose-Capillary: 116 mg/dL — ABNORMAL HIGH (ref 70–99)

## 2022-03-04 MED ORDER — TRAMADOL HCL 50 MG PO TABS: ORAL_TABLET | ORAL | 0 refills | Status: DC

## 2022-03-04 NOTE — Telephone Encounter (Signed)
Patient states he needs a refill on his traMADol (ULTRAM) 50 MG tablet.  Patient states his preferred pharmacy is [facility].  Patient states he would like for Dr. [name] to know that he saw Dr. Merita Norton at [facility].  Patient states Dr. 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  Patient states he also has a very high level of a parathyroid hormone, which means that he cannot retain calcium.  Patient states he would like Dr. Caryl Bis to know that he will be having surgery on his spine on 03/08/2022.  Patient states his screws in thoracic #10 have come loose and they are perforating the disc space of thoracic #9.

## 2022-03-04 NOTE — Progress Notes (Signed)
PCP - [name] MD Cardiologist - Kathlyn Sacramento MD Nephrologist- Murlean Iba MD  PPM/ICD - denies Device Orders -  Rep Notified -   Chest x-ray - 01/29/20 EKG - 12/21/21 Stress Test -  ECHO - 01/30/19 Cardiac Cath - none  Sleep Study - yes CPAP -  pt used to wear CPAP, but after gastic bypass surgery and weight loss, pt no longer has sleep apnea and denies wearing a CPAP anymore.  Fasting Blood Sugar - 90-120 Checks Blood Sugar 2 times a week  Blood Thinner Instructions:na Aspirin Instructions:na  ERAS Protcol -no PRE-SURGERY Ensure or G2-   COVID TEST- na   Anesthesia review: yes- history of CHF  Patient denies shortness of breath, fever, cough and chest pain at PAT appointment   All instructions explained to the patient, with a verbal understanding of the material. Patient agrees to go over the instructions while at home for a better understanding. Patient also instructed to wear a mask when out in public prior to surgery. The opportunity to ask questions was provided.

## 2022-03-04 NOTE — Telephone Encounter (Signed)
Noted. Tramadol sent to pharmacy.

## 2022-03-07 NOTE — Anesthesia Preprocedure Evaluation (Signed)
Anesthesia Evaluation  Patient identified by MRN, date of birth, ID band Patient awake    Reviewed: Allergy & Precautions, NPO status , Patient's Chart, lab work & pertinent test results, reviewed documented beta blocker date and time   Airway Mallampati: II  TM Distance: >3 FB Neck ROM: Full    Dental no notable dental hx. (+) Teeth Intact, Dental Advisory Given   Pulmonary sleep apnea ,    Pulmonary exam normal breath sounds clear to auscultation       Cardiovascular hypertension, Pt. on home beta blockers and Pt. on medications +CHF  Normal cardiovascular exam Rhythm:Regular Rate:Normal  TTE 2020 1. The left ventricle has normal systolic function, with an ejection  fraction of 55-60%. The cavity size was normal. There is mildly increased  left ventricular wall thickness. Left ventricular diastolic Doppler  parameters are consistent with impaired  relaxation.  2. The right ventricle has normal systolic function. The cavity was  normal. There is no increase in right ventricular wall thickness. Right  ventricular systolic pressure could not be assessed.  3. The tricuspid valve is grossly normal.  4. The aortic valve is tricuspid.  5. The aortic root and ascending aorta are normal in size and structure.  6. The interatrial septum was not well visualized.    Neuro/Psych  Headaches, Seizures -,  PSYCHIATRIC DISORDERS Anxiety Depression    GI/Hepatic hiatal hernia, PUD, GERD  ,(+)     substance abuse  ,   Endo/Other  diabetes, Well Controlled, Type 2, Oral Hypoglycemic Agents  Renal/GU Renal InsufficiencyRenal diseaseLab Results      Component                Value               Date                      CREATININE               1.36 (H)            03/04/2022                BUN                      30 (H)              03/04/2022                NA                       140                 03/04/2022                K                         4.9                 03/04/2022                CL                       103                 03/04/2022                CO2  26                  03/04/2022             negative genitourinary   Musculoskeletal  (+) Arthritis  (s/p ACDF), narcotic dependent  Abdominal   Peds  Hematology   Anesthesia Other Findings primidone  Reproductive/Obstetrics                            Anesthesia Physical Anesthesia Plan  ASA: 3  Anesthesia Plan: General   Post-op Pain Management: Tylenol PO (pre-op)* and Ketamine IV*   Induction: Intravenous  PONV Risk Score and Plan: 2 and Midazolam, Dexamethasone, Ondansetron and Treatment may vary due to age or medical condition  Airway Management Planned: Oral ETT and Video Laryngoscope Planned  Additional Equipment:   Intra-op Plan:   Post-operative Plan: Extubation in OR  Informed Consent: I have reviewed the patients History and Physical, chart, labs and discussed the procedure including the risks, benefits and alternatives for the proposed anesthesia with the patient or authorized representative who has indicated his/her understanding and acceptance.     Dental advisory given  Plan Discussed with: CRNA  Anesthesia Plan Comments:        Anesthesia Quick Evaluation

## 2022-03-08 ENCOUNTER — Inpatient Hospital Stay (HOSPITAL_COMMUNITY): Payer: Medicare PPO

## 2022-03-08 ENCOUNTER — Encounter (HOSPITAL_COMMUNITY)
Admission: RE | Disposition: A | Discharge status: Home / Self Care | Payer: Self-pay | Source: Home / Self Care | Attending: Neurosurgery

## 2022-03-08 ENCOUNTER — Other Ambulatory Visit: Payer: Self-pay

## 2022-03-08 ENCOUNTER — Inpatient Hospital Stay (HOSPITAL_COMMUNITY): Payer: Medicare PPO | Admitting: Anesthesiology

## 2022-03-08 ENCOUNTER — Inpatient Hospital Stay (HOSPITAL_COMMUNITY): Payer: Medicare PPO | Admitting: Physician Assistant

## 2022-03-08 ENCOUNTER — Encounter (HOSPITAL_COMMUNITY): Payer: Self-pay | Admitting: Neurosurgery

## 2022-03-08 ENCOUNTER — Inpatient Hospital Stay (HOSPITAL_COMMUNITY)
Admission: RE | Admit: 2022-03-08 | Discharge: 2022-03-08 | DRG: 496 | Disposition: A | Discharge status: Home / Self Care | Payer: Medicare PPO | Attending: Neurosurgery | Admitting: Neurosurgery

## 2022-03-08 DIAGNOSIS — Z9884 Bariatric surgery status: Secondary | ICD-10-CM | POA: Diagnosis not present

## 2022-03-08 DIAGNOSIS — Z85828 Personal history of other malignant neoplasm of skin: Secondary | ICD-10-CM | POA: Diagnosis not present

## 2022-03-08 DIAGNOSIS — Z9049 Acquired absence of other specified parts of digestive tract: Secondary | ICD-10-CM

## 2022-03-08 DIAGNOSIS — Y831 Surgical operation with implant of artificial internal device as the cause of abnormal reaction of the patient, or of later complication, without mention of misadventure at the time of the procedure: Secondary | ICD-10-CM | POA: Diagnosis present

## 2022-03-08 DIAGNOSIS — E785 Hyperlipidemia, unspecified: Secondary | ICD-10-CM | POA: Diagnosis not present

## 2022-03-08 DIAGNOSIS — Z79899 Other long term (current) drug therapy: Secondary | ICD-10-CM

## 2022-03-08 DIAGNOSIS — Z8711 Personal history of peptic ulcer disease: Secondary | ICD-10-CM

## 2022-03-08 DIAGNOSIS — Z8262 Family history of osteoporosis: Secondary | ICD-10-CM

## 2022-03-08 DIAGNOSIS — Z832 Family history of diseases of the blood and blood-forming organs and certain disorders involving the immune mechanism: Secondary | ICD-10-CM

## 2022-03-08 DIAGNOSIS — Z8679 Personal history of other diseases of the circulatory system: Secondary | ICD-10-CM

## 2022-03-08 DIAGNOSIS — Z801 Family history of malignant neoplasm of trachea, bronchus and lung: Secondary | ICD-10-CM

## 2022-03-08 DIAGNOSIS — S32009K Unspecified fracture of unspecified lumbar vertebra, subsequent encounter for fracture with nonunion: Principal | ICD-10-CM | POA: Diagnosis present

## 2022-03-08 DIAGNOSIS — I509 Heart failure, unspecified: Secondary | ICD-10-CM

## 2022-03-08 DIAGNOSIS — F419 Anxiety disorder, unspecified: Secondary | ICD-10-CM | POA: Diagnosis present

## 2022-03-08 DIAGNOSIS — I110 Hypertensive heart disease with heart failure: Secondary | ICD-10-CM

## 2022-03-08 DIAGNOSIS — Z833 Family history of diabetes mellitus: Secondary | ICD-10-CM | POA: Diagnosis not present

## 2022-03-08 DIAGNOSIS — T84226A Displacement of internal fixation device of vertebrae, initial encounter: Principal | ICD-10-CM | POA: Diagnosis present

## 2022-03-08 DIAGNOSIS — I10 Essential (primary) hypertension: Secondary | ICD-10-CM | POA: Diagnosis present

## 2022-03-08 DIAGNOSIS — F32A Depression, unspecified: Secondary | ICD-10-CM | POA: Diagnosis not present

## 2022-03-08 DIAGNOSIS — T84216A Breakdown (mechanical) of internal fixation device of vertebrae, initial encounter: Secondary | ICD-10-CM | POA: Diagnosis not present

## 2022-03-08 DIAGNOSIS — M960 Pseudarthrosis after fusion or arthrodesis: Secondary | ICD-10-CM | POA: Diagnosis not present

## 2022-03-08 DIAGNOSIS — Z8041 Family history of malignant neoplasm of ovary: Secondary | ICD-10-CM

## 2022-03-08 DIAGNOSIS — K219 Gastro-esophageal reflux disease without esophagitis: Secondary | ICD-10-CM | POA: Diagnosis present

## 2022-03-08 DIAGNOSIS — Z8249 Family history of ischemic heart disease and other diseases of the circulatory system: Secondary | ICD-10-CM | POA: Diagnosis not present

## 2022-03-08 DIAGNOSIS — Z807 Family history of other malignant neoplasms of lymphoid, hematopoietic and related tissues: Secondary | ICD-10-CM

## 2022-03-08 DIAGNOSIS — G4730 Sleep apnea, unspecified: Secondary | ICD-10-CM | POA: Diagnosis not present

## 2022-03-08 DIAGNOSIS — Z8042 Family history of malignant neoplasm of prostate: Secondary | ICD-10-CM

## 2022-03-08 DIAGNOSIS — T8484XA Pain due to internal orthopedic prosthetic devices, implants and grafts, initial encounter: Secondary | ICD-10-CM

## 2022-03-08 DIAGNOSIS — Z981 Arthrodesis status: Secondary | ICD-10-CM

## 2022-03-08 DIAGNOSIS — Z96652 Presence of left artificial knee joint: Secondary | ICD-10-CM | POA: Diagnosis present

## 2022-03-08 DIAGNOSIS — E119 Type 2 diabetes mellitus without complications: Secondary | ICD-10-CM | POA: Diagnosis not present

## 2022-03-08 DIAGNOSIS — Z888 Allergy status to other drugs, medicaments and biological substances status: Secondary | ICD-10-CM

## 2022-03-08 DIAGNOSIS — Z808 Family history of malignant neoplasm of other organs or systems: Secondary | ICD-10-CM | POA: Diagnosis not present

## 2022-03-08 DIAGNOSIS — Z7984 Long term (current) use of oral hypoglycemic drugs: Secondary | ICD-10-CM

## 2022-03-08 HISTORY — PX: HARDWARE REMOVAL: SHX979

## 2022-03-08 LAB — GLUCOSE, CAPILLARY
Glucose-Capillary: 119 mg/dL — ABNORMAL HIGH (ref 70–99)
Glucose-Capillary: 95 mg/dL (ref 70–99)
Glucose-Capillary: 91 mg/dL (ref 70–99)
Glucose-Capillary: 69 mg/dL — ABNORMAL LOW (ref 70–99)

## 2022-03-08 SURGERY — REMOVAL, HARDWARE
Anesthesia: General | Site: Back

## 2022-03-08 MED ORDER — OYSTER SHELL CALCIUM/D3 500-5 MG-MCG PO TABS: 1.0000 | ORAL_TABLET | Freq: Every day | ORAL | Status: DC

## 2022-03-08 MED ORDER — DAPAGLIFLOZIN PROPANEDIOL 10 MG PO TABS
10.0000 mg | ORAL_TABLET | Freq: Every day | ORAL | Status: DC
  Filled 2022-03-08: qty 1

## 2022-03-08 MED ORDER — PRIMIDONE 50 MG PO TABS
50.0000 mg | ORAL_TABLET | Freq: Every day | ORAL | Status: DC
  Filled 2022-03-08: qty 1

## 2022-03-08 MED ORDER — EZETIMIBE 10 MG PO TABS: 10.0000 mg | ORAL_TABLET | Freq: Every day | ORAL | Status: DC

## 2022-03-08 MED ORDER — ORAL CARE MOUTH RINSE: 15.0000 mL | Freq: Once | OROMUCOSAL | Status: AC

## 2022-03-08 MED ORDER — ONDANSETRON HCL 4 MG/2ML IJ SOLN: 4.0000 mg | Freq: Four times a day (QID) | INTRAMUSCULAR | Status: DC | PRN

## 2022-03-08 MED ORDER — FUROSEMIDE 20 MG PO TABS: 20.0000 mg | ORAL_TABLET | Freq: Every day | ORAL | Status: DC

## 2022-03-08 MED ORDER — KETAMINE HCL 10 MG/ML IJ SOLN
INTRAMUSCULAR | Status: DC | PRN
  Administered 2022-03-08: 20 mg via INTRAVENOUS

## 2022-03-08 MED ORDER — EPHEDRINE SULFATE-NACL 50-0.9 MG/10ML-% IV SOSY
PREFILLED_SYRINGE | INTRAVENOUS | Status: DC | PRN
  Administered 2022-03-08: 7.5 mg via INTRAVENOUS

## 2022-03-08 MED ORDER — SUGAMMADEX SODIUM 500 MG/5ML IV SOLN
INTRAVENOUS | Status: DC | PRN
  Administered 2022-03-08: 500 mg via INTRAVENOUS

## 2022-03-08 MED ORDER — FENTANYL CITRATE (PF) 100 MCG/2ML IJ SOLN
INTRAMUSCULAR | Status: AC
  Filled 2022-03-08: qty 2

## 2022-03-08 MED ORDER — LIDOCAINE 2% (20 MG/ML) 5 ML SYRINGE
INTRAMUSCULAR | Status: DC | PRN
  Administered 2022-03-08: 60 mg via INTRAVENOUS

## 2022-03-08 MED ORDER — HYDROCODONE-ACETAMINOPHEN 5-325 MG PO TABS: 1.0000 | ORAL_TABLET | ORAL | Status: DC | PRN

## 2022-03-08 MED ORDER — HYDROMORPHONE HCL 1 MG/ML IJ SOLN: 1.0000 mg | INTRAMUSCULAR | Status: DC | PRN

## 2022-03-08 MED ORDER — PROPOFOL 10 MG/ML IV BOLUS
INTRAVENOUS | Status: AC
  Filled 2022-03-08: qty 20

## 2022-03-08 MED ORDER — 0.9 % SODIUM CHLORIDE (POUR BTL) OPTIME
TOPICAL | Status: DC | PRN
  Administered 2022-03-08: 1000 mL

## 2022-03-08 MED ORDER — THROMBIN 20000 UNITS EX SOLR
CUTANEOUS | Status: AC
  Filled 2022-03-08: qty 20000

## 2022-03-08 MED ORDER — METOPROLOL SUCCINATE ER 50 MG PO TB24: 50.0000 mg | ORAL_TABLET | Freq: Every day | ORAL | Status: DC

## 2022-03-08 MED ORDER — ACETAMINOPHEN 500 MG PO TABS: 1000.0000 mg | ORAL_TABLET | Freq: Once | ORAL | Status: DC

## 2022-03-08 MED ORDER — PHENYLEPHRINE HCL-NACL 20-0.9 MG/250ML-% IV SOLN
INTRAVENOUS | Status: DC | PRN
  Administered 2022-03-08: 50 ug/min via INTRAVENOUS

## 2022-03-08 MED ORDER — CYCLOBENZAPRINE HCL 10 MG PO TABS
10.0000 mg | ORAL_TABLET | Freq: Three times a day (TID) | ORAL | Status: DC | PRN
  Administered 2022-03-08: 10 mg via ORAL
  Filled 2022-03-08: qty 1

## 2022-03-08 MED ORDER — TAMSULOSIN HCL 0.4 MG PO CAPS: 0.4000 mg | ORAL_CAPSULE | Freq: Every day | ORAL | Status: DC

## 2022-03-08 MED ORDER — LINAGLIPTIN 5 MG PO TABS
5.0000 mg | ORAL_TABLET | Freq: Every day | ORAL | Status: DC
  Administered 2022-03-08: 5 mg via ORAL
  Filled 2022-03-08 (×2): qty 1

## 2022-03-08 MED ORDER — VENLAFAXINE HCL ER 75 MG PO CP24: 75.0000 mg | ORAL_CAPSULE | Freq: Every day | ORAL | Status: DC

## 2022-03-08 MED ORDER — INSULIN ASPART 100 UNIT/ML IJ SOLN: 0.0000 [IU] | INTRAMUSCULAR | Status: DC | PRN

## 2022-03-08 MED ORDER — KETAMINE HCL 50 MG/5ML IJ SOSY
PREFILLED_SYRINGE | INTRAMUSCULAR | Status: AC
  Filled 2022-03-08: qty 5

## 2022-03-08 MED ORDER — BUPIVACAINE HCL (PF) 0.25 % IJ SOLN
INTRAMUSCULAR | Status: AC
  Filled 2022-03-08: qty 30

## 2022-03-08 MED ORDER — PHENYLEPHRINE 80 MCG/ML (10ML) SYRINGE FOR IV PUSH (FOR BLOOD PRESSURE SUPPORT)
PREFILLED_SYRINGE | INTRAVENOUS | Status: DC | PRN
  Administered 2022-03-08 (×2): 160 ug via INTRAVENOUS

## 2022-03-08 MED ORDER — ROSUVASTATIN CALCIUM 20 MG PO TABS: 40.0000 mg | ORAL_TABLET | Freq: Every day | ORAL | Status: DC

## 2022-03-08 MED ORDER — CEFAZOLIN SODIUM-DEXTROSE 1-4 GM/50ML-% IV SOLN
1.0000 g | Freq: Three times a day (TID) | INTRAVENOUS | Status: DC
  Administered 2022-03-08: 1 g via INTRAVENOUS
  Filled 2022-03-08: qty 50

## 2022-03-08 MED ORDER — ACETAMINOPHEN 325 MG PO TABS: 650.0000 mg | ORAL_TABLET | ORAL | Status: DC | PRN

## 2022-03-08 MED ORDER — TRAMADOL HCL 50 MG PO TABS
50.0000 mg | ORAL_TABLET | Freq: Four times a day (QID) | ORAL | Status: DC
  Administered 2022-03-08: 50 mg via ORAL
  Filled 2022-03-08: qty 1

## 2022-03-08 MED ORDER — CHLORHEXIDINE GLUCONATE 0.12 % MT SOLN
15.0000 mL | Freq: Once | OROMUCOSAL | Status: AC
  Administered 2022-03-08: 15 mL via OROMUCOSAL
  Filled 2022-03-08: qty 15

## 2022-03-08 MED ORDER — MULTIVITAMINS PO CAPS: 1.0000 | ORAL_CAPSULE | Freq: Every day | ORAL | Status: DC

## 2022-03-08 MED ORDER — GABAPENTIN 300 MG PO CAPS: 300.0000 mg | ORAL_CAPSULE | Freq: Three times a day (TID) | ORAL | Status: DC

## 2022-03-08 MED ORDER — ONDANSETRON HCL 4 MG PO TABS: 4.0000 mg | ORAL_TABLET | Freq: Four times a day (QID) | ORAL | Status: DC | PRN

## 2022-03-08 MED ORDER — THROMBIN 20000 UNITS EX SOLR
CUTANEOUS | Status: DC | PRN
  Administered 2022-03-08: 20 mL via TOPICAL

## 2022-03-08 MED ORDER — PHENOL 1.4 % MT LIQD: 1.0000 | OROMUCOSAL | Status: DC | PRN

## 2022-03-08 MED ORDER — ONDANSETRON HCL 4 MG/2ML IJ SOLN
INTRAMUSCULAR | Status: DC | PRN
  Administered 2022-03-08: 4 mg via INTRAVENOUS

## 2022-03-08 MED ORDER — KETOROLAC TROMETHAMINE 15 MG/ML IJ SOLN
7.5000 mg | Freq: Four times a day (QID) | INTRAMUSCULAR | Status: DC
  Administered 2022-03-08: 7.5 mg via INTRAVENOUS
  Filled 2022-03-08: qty 1

## 2022-03-08 MED ORDER — LACTATED RINGERS IV SOLN: INTRAVENOUS | Status: DC

## 2022-03-08 MED ORDER — HYDROCODONE-ACETAMINOPHEN 10-325 MG PO TABS
2.0000 | ORAL_TABLET | ORAL | Status: DC | PRN
  Administered 2022-03-08: 2 via ORAL
  Filled 2022-03-08: qty 2

## 2022-03-08 MED ORDER — SUCRALFATE 1 G PO TABS
1.0000 g | ORAL_TABLET | Freq: Three times a day (TID) | ORAL | Status: DC
  Administered 2022-03-08 (×2): 1 g via ORAL
  Filled 2022-03-08 (×5): qty 1

## 2022-03-08 MED ORDER — PROPOFOL 10 MG/ML IV BOLUS
INTRAVENOUS | Status: DC | PRN
  Administered 2022-03-08: 130 mg via INTRAVENOUS

## 2022-03-08 MED ORDER — CHLORHEXIDINE GLUCONATE CLOTH 2 % EX PADS: 6.0000 | MEDICATED_PAD | Freq: Once | CUTANEOUS | Status: DC

## 2022-03-08 MED ORDER — FENTANYL CITRATE (PF) 100 MCG/2ML IJ SOLN
25.0000 ug | INTRAMUSCULAR | Status: DC | PRN
  Administered 2022-03-08 (×2): 25 ug via INTRAVENOUS

## 2022-03-08 MED ORDER — PANTOPRAZOLE SODIUM 40 MG PO TBEC
40.0000 mg | DELAYED_RELEASE_TABLET | Freq: Every day | ORAL | Status: DC
  Administered 2022-03-08: 40 mg via ORAL
  Filled 2022-03-08: qty 1

## 2022-03-08 MED ORDER — SODIUM CHLORIDE 0.9% FLUSH
3.0000 mL | Freq: Two times a day (BID) | INTRAVENOUS | Status: DC
Start: 2022-03-08 — End: 2022-03-09
  Administered 2022-03-08: 3 mL via INTRAVENOUS

## 2022-03-08 MED ORDER — INSULIN ASPART 100 UNIT/ML IJ SOLN: 0.0000 [IU] | Freq: Three times a day (TID) | INTRAMUSCULAR | Status: DC

## 2022-03-08 MED ORDER — FENTANYL CITRATE (PF) 250 MCG/5ML IJ SOLN
INTRAMUSCULAR | Status: DC | PRN
  Administered 2022-03-08: 100 ug via INTRAVENOUS

## 2022-03-08 MED ORDER — SODIUM CHLORIDE 0.9 % IV SOLN: 250.0000 mL | INTRAVENOUS | Status: DC

## 2022-03-08 MED ORDER — VANCOMYCIN HCL 1000 MG IV SOLR
INTRAVENOUS | Status: AC
  Filled 2022-03-08: qty 20

## 2022-03-08 MED ORDER — ROCURONIUM BROMIDE 10 MG/ML (PF) SYRINGE
PREFILLED_SYRINGE | INTRAVENOUS | Status: DC | PRN
  Administered 2022-03-08: 100 mg via INTRAVENOUS

## 2022-03-08 MED ORDER — BUPIVACAINE HCL (PF) 0.25 % IJ SOLN
INTRAMUSCULAR | Status: DC | PRN
  Administered 2022-03-08: 10 mL

## 2022-03-08 MED ORDER — KETOROLAC TROMETHAMINE 30 MG/ML IJ SOLN
INTRAMUSCULAR | Status: DC | PRN
  Administered 2022-03-08: 15 mg via INTRAVENOUS

## 2022-03-08 MED ORDER — ADULT MULTIVITAMIN W/MINERALS CH: 1.0000 | ORAL_TABLET | Freq: Every day | ORAL | Status: DC

## 2022-03-08 MED ORDER — GABAPENTIN 300 MG PO CAPS
300.0000 mg | ORAL_CAPSULE | Freq: Three times a day (TID) | ORAL | Status: DC
  Administered 2022-03-08: 300 mg via ORAL
  Filled 2022-03-08: qty 1

## 2022-03-08 MED ORDER — CEFAZOLIN SODIUM-DEXTROSE 2-4 GM/100ML-% IV SOLN
2.0000 g | INTRAVENOUS | Status: AC
  Administered 2022-03-08: 2 g via INTRAVENOUS
  Filled 2022-03-08: qty 100

## 2022-03-08 MED ORDER — SODIUM CHLORIDE 0.9% FLUSH: 3.0000 mL | INTRAVENOUS | Status: DC | PRN

## 2022-03-08 MED ORDER — MENTHOL 3 MG MT LOZG
1.0000 | LOZENGE | OROMUCOSAL | Status: DC | PRN
Start: 2022-03-08 — End: 2022-03-09

## 2022-03-08 MED ORDER — FENTANYL CITRATE (PF) 250 MCG/5ML IJ SOLN
INTRAMUSCULAR | Status: AC
  Filled 2022-03-08: qty 5

## 2022-03-08 MED ORDER — ACETAMINOPHEN 650 MG RE SUPP: 650.0000 mg | RECTAL | Status: DC | PRN

## 2022-03-08 SURGICAL SUPPLY — 54 items
BAG COUNTER SPONGE SURGICOUNT (BAG) ×2 IMPLANT
BAG DECANTER FOR FLEXI CONT (MISCELLANEOUS) ×2 IMPLANT
BAND RUBBER #18 3X1/16 STRL (MISCELLANEOUS) ×4 IMPLANT
BENZOIN TINCTURE PRP APPL 2/3 (GAUZE/BANDAGES/DRESSINGS) ×2 IMPLANT
BLADE CLIPPER SURG (BLADE) IMPLANT
BUR CUTTER 7.0 ROUND (BURR) ×2 IMPLANT
CANISTER SUCT 3000ML PPV (MISCELLANEOUS) ×2 IMPLANT
CARTRIDGE OIL MAESTRO DRILL (MISCELLANEOUS) ×1 IMPLANT
CLSR STERI-STRIP ANTIMIC 1/2X4 (GAUZE/BANDAGES/DRESSINGS) ×1 IMPLANT
DERMABOND ADVANCED (GAUZE/BANDAGES/DRESSINGS) ×1
DERMABOND ADVANCED .7 DNX12 (GAUZE/BANDAGES/DRESSINGS) ×1 IMPLANT
DIFFUSER DRILL AIR PNEUMATIC (MISCELLANEOUS) ×2 IMPLANT
DRAPE HALF SHEET 40X57 (DRAPES) IMPLANT
DRAPE LAPAROTOMY 100X72X124 (DRAPES) ×2 IMPLANT
DRAPE MICROSCOPE LEICA (MISCELLANEOUS) ×1 IMPLANT
DRAPE SURG 17X23 STRL (DRAPES) ×4 IMPLANT
DRSG OPSITE POSTOP 4X6 (GAUZE/BANDAGES/DRESSINGS) ×1 IMPLANT
ELECT REM PT RETURN 9FT ADLT (ELECTROSURGICAL) ×2
ELECTRODE REM PT RTRN 9FT ADLT (ELECTROSURGICAL) ×1 IMPLANT
GAUZE 4X4 16PLY ~~LOC~~+RFID DBL (SPONGE) IMPLANT
GAUZE SPONGE 4X4 12PLY STRL (GAUZE/BANDAGES/DRESSINGS) ×2 IMPLANT
GLOVE BIO SURGEON STRL SZ 6.5 (GLOVE) ×2 IMPLANT
GLOVE BIO SURGEON STRL SZ7 (GLOVE) ×4 IMPLANT
GLOVE BIOGEL PI IND STRL 6.5 (GLOVE) ×1 IMPLANT
GLOVE BIOGEL PI IND STRL 7.0 (GLOVE) IMPLANT
GLOVE BIOGEL PI INDICATOR 6.5 (GLOVE) ×1
GLOVE BIOGEL PI INDICATOR 7.0 (GLOVE) ×3
GLOVE ECLIPSE 9.0 STRL (GLOVE) ×2 IMPLANT
GLOVE EXAM NITRILE XL STR (GLOVE) IMPLANT
GOWN STRL REUS W/ TWL LRG LVL3 (GOWN DISPOSABLE) IMPLANT
GOWN STRL REUS W/ TWL XL LVL3 (GOWN DISPOSABLE) ×1 IMPLANT
GOWN STRL REUS W/TWL 2XL LVL3 (GOWN DISPOSABLE) IMPLANT
GOWN STRL REUS W/TWL LRG LVL3 (GOWN DISPOSABLE)
GOWN STRL REUS W/TWL XL LVL3 (GOWN DISPOSABLE) ×2
KIT BASIN OR (CUSTOM PROCEDURE TRAY) ×2 IMPLANT
KIT TURNOVER KIT B (KITS) ×2 IMPLANT
NDL SPNL 22GX3.5 QUINCKE BK (NEEDLE) ×1 IMPLANT
NEEDLE HYPO 22GX1.5 SAFETY (NEEDLE) ×2 IMPLANT
NEEDLE SPNL 22GX3.5 QUINCKE BK (NEEDLE) ×2 IMPLANT
NS IRRIG 1000ML POUR BTL (IV SOLUTION) ×2 IMPLANT
OIL CARTRIDGE MAESTRO DRILL (MISCELLANEOUS) ×2
PACK LAMINECTOMY NEURO (CUSTOM PROCEDURE TRAY) ×2 IMPLANT
PAD ARMBOARD 7.5X6 YLW CONV (MISCELLANEOUS) ×6 IMPLANT
RASP 3.0MM (RASP) ×1 IMPLANT
SPIKE FLUID TRANSFER (MISCELLANEOUS) ×2 IMPLANT
SPONGE SURGIFOAM ABS GEL 100 (HEMOSTASIS) ×2 IMPLANT
STRIP CLOSURE SKIN 1/2X4 (GAUZE/BANDAGES/DRESSINGS) ×2 IMPLANT
SUT VIC AB 0 CT1 18XCR BRD8 (SUTURE) IMPLANT
SUT VIC AB 0 CT1 8-18 (SUTURE) ×2
SUT VIC AB 2-0 CT1 18 (SUTURE) ×2 IMPLANT
SUT VIC AB 3-0 SH 8-18 (SUTURE) ×2 IMPLANT
TOWEL GREEN STERILE (TOWEL DISPOSABLE) ×2 IMPLANT
TOWEL GREEN STERILE FF (TOWEL DISPOSABLE) ×2 IMPLANT
WATER STERILE IRR 1000ML POUR (IV SOLUTION) ×2 IMPLANT

## 2022-03-08 NOTE — Discharge Instructions (Signed)

## 2022-03-08 NOTE — Transfer of Care (Signed)
Immediate Anesthesia Transfer of Care Note  Patient: Justin Patel  Procedure(s) Performed: Removal of bilateral Thoracic ten pedicle screws (Back)  Patient Location: PACU  Anesthesia Type:General  Level of Consciousness: drowsy and patient cooperative  Airway & Oxygen Therapy: Patient Spontanous Breathing  Post-op Assessment: Report given to RN and Post -op Vital signs reviewed and stable  Post vital signs: Reviewed and stable  Last Vitals:  Vitals Value Taken Time  BP    Temp    Pulse 73 03/08/22 0926  Resp 16 03/08/22 0926  SpO2 94 % 03/08/22 0926  Vitals shown include unvalidated device data.  Last Pain:  Vitals:   03/08/22 0622  TempSrc:   PainSc: 8       Patients Stated Pain Goal: 0 (31/54/00 8676)  Complications: No notable events documented.

## 2022-03-08 NOTE — Progress Notes (Signed)
Patient awaiting to be transported via wheelchair to his vehicle by RN for discharge home; moves all extremities well using a cane; incision on his back with honeycomb dressing and is clean, dry and intact; in no acute distress nor complaints of pain nor discomfort; room was checked for all his belongings; discharge instructions concerning his medications, incision care, follow up appointment and when to call the doctor as needed were all discussed with patient by RN and he expressed understanding on the instructions given.

## 2022-03-08 NOTE — Discharge Summary (Signed)
Physician Discharge Summary  Patient ID: Justin Patel MRN: [id number] DOB/AGE: [birth year] 71 y.o.  Admit date: 03/08/2022 Discharge date: 03/08/2022  Admission Diagnoses:  Discharge Diagnoses:  Principal Problem:   Lumbar pseudoarthrosis   Discharged Condition: good  Hospital Course: Patient admitted to the hospital where he underwent uncomplicated reexploration of his thoracic fusion with removal of loosened instrumentation.  Postoperative doing well.  Pain well controlled.  Standing ambulating and voiding without difficulty.  Ready for discharge home.  Consults:   Significant Diagnostic Studies:   Treatments:   Discharge Exam: Blood pressure (!) 141/91, pulse 66, temperature 97.8 F (36.6 C), resp. rate 18, height 5' 5"  (1.651 m), weight 76.2 kg, SpO2 100 %. Awake and alert.  Oriented and appropriate.  Motor and sensory function intact.  Wound is clean and dry.  Chest and abdomen benign.  Disposition: Discharge disposition: 01-Home or Self Care        Allergies as of 03/08/2022       Reactions   Altace [ramipril] Anaphylaxis   Ace Inhibitors Hives   Levaquin [levofloxacin In D5w] Hives   Lyrica [pregabalin] Other (See Comments)   Edema   Metformin Diarrhea   High doses cause diarrhea.   Trulicity [dulaglutide] Nausea And Vomiting        Medication List     TAKE these medications    acetaminophen 325 MG tablet Commonly known as: TYLENOL Take 650 mg by mouth every 6 (six) hours as needed for moderate pain.   blood glucose meter kit and supplies Kit Dispense based on patient and insurance preference. Check once daily. ICD doing E11.9.   CALCIUM 600/VITAMIN D PO Take 1 tablet by mouth daily.   cyclobenzaprine 5 MG tablet Commonly known as: FLEXERIL TAKE 1 TABLET BY MOUTH EVERY 8 HOURS AS NEEDED FOR SPASMS.   dapagliflozin propanediol 10 MG Tabs tablet Commonly known as: Farxiga Take 1 tablet (10 mg total) by mouth daily before breakfast.    esomeprazole 40 MG capsule Commonly known as: NEXIUM TAKE 1 CAPSULE BY MOUTH ONCE DAILY   ezetimibe 10 MG tablet Commonly known as: ZETIA TAKE ONE TABLET BY MOUTH EVERY DAY   furosemide 20 MG tablet Commonly known as: LASIX TAKE ONE TABLET BY MOUTH EVERY DAY   gabapentin 300 MG capsule Commonly known as: NEURONTIN Take 1 capsule (300 mg total) by mouth 3 (three) times daily.   metoprolol succinate 50 MG 24 hr tablet Commonly known as: TOPROL-XL TAKE 1 TABLET BY MOUTH DAILY WITH OR IMMEDIATLY FOLLOWING A MEAL   metroNIDAZOLE 0.75 % gel Commonly known as: METROGEL Apply 1 application topically 2 (two) times daily. For rosacea - apply to the cheeks, chin, and nose BID. What changed:  when to take this reasons to take this additional instructions   multivitamin capsule Take 1 capsule by mouth daily. With copper and zinc   nystatin-triamcinolone ointment Commonly known as: MYCOLOG Apply 1 application topically 2 (two) times daily. What changed:  when to take this reasons to take this   primidone 50 MG tablet Commonly known as: MYSOLINE Take 1 tablet (50 mg total) by mouth at bedtime.   rosuvastatin 40 MG tablet Commonly known as: CRESTOR TAKE ONE TABLET EVERY DAY   sucralfate 1 g tablet Commonly known as: CARAFATE TAKE 1 TABLET BY MOUTH 4 TIMES DAILY   tamsulosin 0.4 MG Caps capsule Commonly known as: FLOMAX TAKE 1 CAPSULE BY MOUTH EVERY DAY   Tradjenta 5 MG Tabs tablet Generic drug: linagliptin TAKE 1  TABLET BY MOUTH DAILY   traMADol 50 MG tablet Commonly known as: ULTRAM TAKE 1-2 TABLETS BY MOUTH EVERY 4-6 HOURS AS NEEDED   venlafaxine XR 75 MG 24 hr capsule Commonly known as: EFFEXOR-XR TAKE ONE CAPSULE EVERY MORNING WITH BREAKFAST         Signed: Cooper Render Tyjai Matuszak 03/08/2022, 4:30 PM

## 2022-03-08 NOTE — Brief Op Note (Signed)
03/08/2022  9:08 AM  PATIENT:  [name]  71 y.o. male  PRE-OPERATIVE DIAGNOSIS:  Painful hardware  POST-OPERATIVE DIAGNOSIS:  Painful hardware  PROCEDURE:  Procedure(s): Removal of bilateral Thoracic ten pedicle screws (N/A)  SURGEON:  Surgeon(s) and Role:    Earnie Larsson, MD - Primary  PHYSICIAN ASSISTANT:   ASSISTANTSMearl Latin   ANESTHESIA:   general  EBL:  Minimal   BLOOD ADMINISTERED:none  DRAINS: none   LOCAL MEDICATIONS USED:  MARCAINE     SPECIMEN:  No Specimen  DISPOSITION OF SPECIMEN:  PATHOLOGY  COUNTS:  YES  TOURNIQUET:  * No tourniquets in log *  DICTATION: .Dragon Dictation  PLAN OF CARE: Admit for overnight observation  PATIENT DISPOSITION:  PACU - hemodynamically stable.   Delay start of Pharmacological VTE agent (>24hrs) due to surgical blood loss or risk of bleeding: yes

## 2022-03-08 NOTE — Anesthesia Procedure Notes (Signed)
Procedure Name: Intubation Date/Time: 03/08/2022 8:25 AM  Performed by: Georgia Duff, CRNAPre-anesthesia Checklist: Patient identified, Emergency Drugs available, Suction available and Patient being monitored Patient Re-evaluated:Patient Re-evaluated prior to induction Oxygen Delivery Method: Circle System Utilized Preoxygenation: Pre-oxygenation with 100% oxygen Induction Type: IV induction Ventilation: Mask ventilation without difficulty Laryngoscope Size: Glidescope and 4 Grade View: Grade I Tube type: Oral Tube size: 7.5 mm Number of attempts: 1 Airway Equipment and Method: Stylet and Oral airway Placement Confirmation: ETT inserted through vocal cords under direct vision, positive ETCO2 and breath sounds checked- equal and bilateral Secured at: 21 cm Tube secured with: Tape Dental Injury: Teeth and Oropharynx as per pre-operative assessment

## 2022-03-08 NOTE — Op Note (Signed)
Date of procedure: 03/08/2022  Date of dictation: Same  Service: Neurosurgery  Preoperative diagnosis: T10-T11 pseudoarthrosis with hardware failure  Postoperative diagnosis: Same  Procedure Name: Removal of painful T10 pedicle screws bilaterally  Surgeon:Tinsleigh Slovacek A.Harun Brumley, M.D.  Asst. Surgeon: Reinaldo Meeker, NP  Anesthesia: General  Indication: 71 year old male status post prior thoracolumbar pelvic fusion surgery by Dr. Vertell Limber.  Patient with persistent mid back pain without significant radicular symptoms.  Work-up demonstrates evidence of solid fusion from T11 distally however patient has nonunion at T10-11 with pedicle screw loosening at T10.  Patient presents now for removal of his T10 pedicle screws.  There is no evidence of instability or need for extension of his fusion at this time.  Operative note: After induction of anesthesia, patient position prone onto bolsters and appropriately padded.  Patient's thoracic region prepped and draped sterilely.  Incision made overlying the protuberant hardware at the T10 level.  Dissection was performed.  The pedicle screw and rod was dissected free.  The rod was cut between the T10 and T11 screws.  The segment of rod and the screw was then removed.  There is no evidence of any gross instability or other complications.  Wound was irrigated and then closed in layers with Vicryl sutures.  Procedure was repeated on the contralateral side in a similar fashion without complications.  Patient tolerated procedure well and he returned to the recovery room postop.

## 2022-03-08 NOTE — H&P (Signed)
Justin Patel is an 71 y.o. male.   Chief Complaint: Back pain HPI: 71 year old male status post thoracolumbar sacral pelvic fusion surgery by Dr. Vertell Limber.  Patient with persistent mid back pain.  Work-up demonstrates evidence of loosening of his T10 pedicle screw with some violation of the superior endplate of O35.  No gross instability.  Fusion appears solid from T11 distally.  Patient presents now for T10 pedicle screw removal.  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  removal scheduled to clear margins.   CHF (congestive heart failure) (HCC)    no longer after weight loss   Chronic kidney disease    cysts   Degenerative disc disease    Depression    Diabetes mellitus    no longer diabetic-or on meds   DJD (degenerative joint disease)    Dysplastic nevus 02/04/2016   R upper back paraspinal - severe   Family history of adverse reaction to anesthesia    sons wake up combative   Foot fracture, left 01/2022   Gastric ulcer    GERD (gastroesophageal reflux disease)    H/O hiatal hernia    Headache(784.0)    sinus   Heart murmur    Hx of congestive heart failure    Hyperlipidemia    Hypertension    Iron deficiency anemia 02/28/2015   Lumbar scoliosis    Opiate abuse, continuous ([place]) 06/20/2015   Sacral fracture, closed ([place])    Seizures ([place])    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, after GI bleed   Sleep apnea    hx not now since wt loss   Stones in the urinary tract    Syncope and collapse     Past Surgical History:  Procedure Laterality Date   ANTERIOR CERVICAL DECOMP/DISCECTOMY FUSION  01/26/2012   Procedure: ANTERIOR CERVICAL DECOMPRESSION/DISCECTOMY FUSION 3 LEVELS;  Surgeon: Floyce Stakes, MD;  Location: [facility];  Service: Neurosurgery;  Laterality: N/A;  Cervical three-four Cervical four-five Cervical  five-six Cervical six-seven , Anterior cervical decompression/diskectomy, fusion, plate   ANTERIOR LAT LUMBAR FUSION Right 04/25/2018   Procedure: Right Lumbar two-three Lumbar three-four Lumbar four-five anterior  lateral interbody fusion  with posterior percutaneous pedicle screws;  Surgeon: Erline Levine, MD;  Location: [place];  Service: Neurosurgery;  Laterality: Right;   [place], normal   CARDIAC CATHETERIZATION     [place]     COLONOSCOPY WITH PROPOFOL N/A 12/27/2017   Procedure: COLONOSCOPY WITH PROPOFOL;  Surgeon: Toledo, Benay Pike, MD;  Location: [facility];  Service: Gastroenterology;  Laterality: N/A;   ESOPHAGOGASTRODUODENOSCOPY (EGD) WITH PROPOFOL N/A 12/27/2017   Procedure: ESOPHAGOGASTRODUODENOSCOPY (EGD) WITH PROPOFOL;  Surgeon: Toledo, Benay Pike, MD;  Location: [facility];  Service: Gastroenterology;  Laterality: N/A;   ESOPHAGOGASTRODUODENOSCOPY (EGD) WITH PROPOFOL N/A 06/19/2021   Procedure: ESOPHAGOGASTRODUODENOSCOPY (EGD) WITH PROPOFOL;  Surgeon: [name], MD;  Location: [facility];  Service: Gastroenterology;  Laterality: N/A;   ESOPHAGOGASTRODUODENOSCOPY (EGD) WITH PROPOFOL N/A 09/25/2021   Procedure: ESOPHAGOGASTRODUODENOSCOPY (EGD) WITH PROPOFOL;  Surgeon: [name], MD;  Location: [facility];  Service: Endoscopy;  Laterality: N/A;   GASTRIC BYPASS  08/09/2008   [employer]   HERNIA REPAIR  08/09/1998   hiatal  JOINT REPLACEMENT     KNEE ARTHROSCOPY     bilateral, left x 2   LAMINECTOMY WITH POSTERIOR LATERAL ARTHRODESIS LEVEL 4 N/A 01/14/2020   Procedure: Decompressive Laminectomy Lumbar One with pedicle screw fixation from Thoracic Ten to Lumbar Two;  Surgeon: Erline Levine, MD;  Location: [place];  Service: Neurosurgery;  Laterality: N/A;  Decompressive Laminectomy Lumbar One with pedicle screw fixation from Thoracic Ten to Lumbar Two   LUMBAR  PERCUTANEOUS PEDICLE SCREW 3 LEVEL N/A 04/25/2018   Procedure: LUMBAR PERCUTANEOUS PEDICLE SCREW 3 LEVEL;  Surgeon: Erline Levine, MD;  Location: [place];  Service: Neurosurgery;  Laterality: N/A;   NASAL SINUS SURGERY     x5   OSTEOTOMY  08/10/1999   left   ROUX-EN-Y GASTRIC BYPASS     TONSILLECTOMY     TOTAL KNEE ARTHROPLASTY Left 05/09/2014   Dr. Marry Guan   UVULOPALATOPLASTY  08/09/2009    Family History  Problem Relation Age of Onset   Dementia Mother 72   Osteoporosis Mother    Heart disease Father 4   Diabetes Father    Lymphoma Sister        lymphoma, stage 4   Ovarian cancer Sister 48       Ovarian   Lupus Sister    Prostate cancer Maternal Uncle    Skin cancer Maternal Uncle    Tongue cancer Maternal Uncle        uncle died of heart attack   Alcoholism Maternal Uncle    Lung cancer Paternal Aunt        pat aunts x 4 died of lung cancer   Lung cancer Paternal Aunt    Lung cancer Paternal Aunt    Lung cancer Paternal Aunt    Mesothelioma Cousin        paternal cousin   Social History:  reports that he has never smoked. He has never used smokeless tobacco. He reports current alcohol use. He reports that he does not use drugs.  Allergies:  Allergies  Allergen Reactions   Altace [Ramipril] Anaphylaxis   Ace Inhibitors Hives   Levaquin [Levofloxacin In D5w] Hives   Lyrica [Pregabalin] Other (See Comments)    Edema   Metformin Diarrhea    High doses cause diarrhea.    Trulicity [Dulaglutide] Nausea And Vomiting    Medications Prior to Admission  Medication Sig Dispense Refill   acetaminophen (TYLENOL) 325 MG tablet Take 650 mg by mouth every 6 (six) hours as needed for moderate pain.     Calcium Carbonate-Vitamin D (CALCIUM 600/VITAMIN D PO) Take 1 tablet by mouth daily.     cyclobenzaprine (FLEXERIL) 5 MG tablet TAKE 1 TABLET BY MOUTH EVERY 8 HOURS AS NEEDED FOR SPASMS. 30 tablet 1   dapagliflozin propanediol (FARXIGA) 10 MG TABS tablet Take 1 tablet (10 mg  total) by mouth daily before breakfast. 90 tablet 1   esomeprazole (NEXIUM) 40 MG capsule TAKE 1 CAPSULE BY MOUTH ONCE DAILY 90 capsule 1   ezetimibe (ZETIA) 10 MG tablet TAKE ONE TABLET BY MOUTH EVERY DAY 90 tablet 1   furosemide (LASIX) 20 MG tablet TAKE ONE TABLET BY MOUTH EVERY DAY 90 tablet 1   gabapentin (NEURONTIN) 300 MG capsule Take 1 capsule (300 mg total) by mouth 3 (three) times daily. 270 capsule 1   metoprolol succinate (TOPROL-XL) 50 MG 24 hr tablet TAKE 1 TABLET BY MOUTH DAILY WITH OR IMMEDIATLY FOLLOWING A MEAL 90 tablet 1   metroNIDAZOLE (METROGEL) 0.75 % gel Apply  1 application topically 2 (two) times daily. For rosacea - apply to the cheeks, chin, and nose BID. (Patient taking differently: Apply 1 application  topically 2 (two) times daily as needed (rosacea). apply to the cheeks, chin, and nose) 45 g 3   Multiple Vitamin (MULTIVITAMIN) capsule Take 1 capsule by mouth daily. With copper and zinc     nystatin-triamcinolone ointment (MYCOLOG) Apply 1 application topically 2 (two) times daily. (Patient taking differently: Apply 1 application  topically daily as needed (irritation).) 30 g 0   primidone (MYSOLINE) 50 MG tablet Take 1 tablet (50 mg total) by mouth at bedtime. 30 tablet 11   rosuvastatin (CRESTOR) 40 MG tablet TAKE ONE TABLET EVERY DAY 90 tablet 0   sucralfate (CARAFATE) 1 g tablet TAKE 1 TABLET BY MOUTH 4 TIMES DAILY 360 tablet 1   tamsulosin (FLOMAX) 0.4 MG CAPS capsule TAKE 1 CAPSULE BY MOUTH EVERY DAY 90 capsule 3   TRADJENTA 5 MG TABS tablet TAKE 1 TABLET BY MOUTH DAILY 90 tablet 1   traMADol (ULTRAM) 50 MG tablet TAKE 1-2 TABLETS BY MOUTH EVERY 4-6 HOURS AS NEEDED 180 tablet 0   venlafaxine XR (EFFEXOR-XR) 75 MG 24 hr capsule TAKE ONE CAPSULE EVERY MORNING WITH BREAKFAST 90 capsule 1   blood glucose meter kit and supplies KIT Dispense based on patient and insurance preference. Check once daily. ICD doing E11.9. 1 each 0    Results for orders placed or performed  during the hospital encounter of 03/08/22 (from the past 48 hour(s))  Glucose, capillary     Status: Abnormal   Collection Time: 03/08/22  6:19 AM  Result Value Ref Range   Glucose-Capillary 119 (H) 70 - 99 mg/dL    Comment: Glucose reference range applies only to samples taken after fasting for at least 8 hours.   No results found.  Pertinent items noted in HPI and remainder of comprehensive ROS otherwise negative.  Blood pressure 131/71, pulse 69, temperature 98.5 F (36.9 C), temperature source Oral, resp. rate 17, height 5' 5"  (1.651 m), weight 76.2 kg, SpO2 95 %.  Awake and alert.  Oriented and appropriate.  Speech is fluent.  Judgment insight are intact.  Cranial nerve function normal by.  Motor examination 5/5 bilaterally.  No pronator drift.  Sensory examination with some patchy distal sensory loss in both upper and lower extremities.  Reflexes are hypoactive but symmetric.  Gait is antalgic.  Posture is reasonably normal peer examination head ears eyes nose throat is unremarkable chest and abdomen are benign.  Extremities are free of major deformity. Assessment/Plan T10-T11 pseudoarthrosis with loose and painful hardware.  Plan removal of T10 pedicle screw instrumentation.  Risks and benefits been explained.  Patient wishes to proceed.  Mallie Mussel A Woodford Strege 03/08/2022, 7:56 AM

## 2022-03-08 NOTE — Anesthesia Postprocedure Evaluation (Signed)
Anesthesia Post Note  Patient: Justin Patel  Procedure(s) Performed: Removal of bilateral Thoracic ten pedicle screws (Back)     Patient location during evaluation: PACU Anesthesia Type: General Level of consciousness: awake and alert Pain management: pain level controlled Vital Signs Assessment: post-procedure vital signs reviewed and stable Respiratory status: spontaneous breathing, nonlabored ventilation, respiratory function stable and patient connected to nasal cannula oxygen Cardiovascular status: blood pressure returned to baseline and stable Postop Assessment: no apparent nausea or vomiting Anesthetic complications: no   No notable events documented.  Last Vitals:  Vitals:   03/08/22 1000 03/08/22 1029  BP: 125/71 137/71  Pulse: 73 66  Resp: 11 18  Temp: 36.5 C 36.6 C  SpO2: 94% 100%    Last Pain:  Vitals:   03/08/22 1242  TempSrc:   PainSc: 8                  Justin Patel

## 2022-03-08 NOTE — Plan of Care (Signed)
  Problem: Education: Goal: Ability to describe self-care measures that may prevent or decrease complications (Diabetes Survival Skills Education) will improve Outcome: Completed/Met Goal: Individualized Educational Video(s) Outcome: Completed/Met   Problem: Coping: Goal: Ability to adjust to condition or change in health will improve Outcome: Completed/Met   Problem: Fluid Volume: Goal: Ability to maintain a balanced intake and output will improve Outcome: Completed/Met   Problem: Health Behavior/Discharge Planning: Goal: Ability to identify and utilize available resources and services will improve Outcome: Completed/Met Goal: Ability to manage health-related needs will improve Outcome: Completed/Met   Problem: Metabolic: Goal: Ability to maintain appropriate glucose levels will improve Outcome: Completed/Met   Problem: Nutritional: Goal: Maintenance of adequate nutrition will improve Outcome: Completed/Met Goal: Progress toward achieving an optimal weight will improve Outcome: Completed/Met   Problem: Skin Integrity: Goal: Risk for impaired skin integrity will decrease Outcome: Completed/Met   Problem: Tissue Perfusion: Goal: Adequacy of tissue perfusion will improve Outcome: Completed/Met   Problem: Education: Goal: Knowledge of General Education information will improve Description: Including pain rating scale, medication(s)/side effects and non-pharmacologic comfort measures Outcome: Completed/Met   Problem: Health Behavior/Discharge Planning: Goal: Ability to manage health-related needs will improve Outcome: Completed/Met   Problem: Clinical Measurements: Goal: Ability to maintain clinical measurements within normal limits will improve Outcome: Completed/Met Goal: Will remain free from infection Outcome: Completed/Met Goal: Diagnostic test results will improve Outcome: Completed/Met Goal: Respiratory complications will improve Outcome: Completed/Met Goal:  Cardiovascular complication will be avoided Outcome: Completed/Met   Problem: Activity: Goal: Risk for activity intolerance will decrease Outcome: Completed/Met   Problem: Nutrition: Goal: Adequate nutrition will be maintained Outcome: Completed/Met   Problem: Coping: Goal: Level of anxiety will decrease Outcome: Completed/Met   Problem: Elimination: Goal: Will not experience complications related to bowel motility Outcome: Completed/Met Goal: Will not experience complications related to urinary retention Outcome: Completed/Met   Problem: Pain Managment: Goal: General experience of comfort will improve Outcome: Completed/Met   Problem: Safety: Goal: Ability to remain free from injury will improve Outcome: Completed/Met   Problem: Skin Integrity: Goal: Risk for impaired skin integrity will decrease Outcome: Completed/Met   Problem: Education: Goal: Ability to verbalize activity precautions or restrictions will improve Outcome: Completed/Met Goal: Knowledge of the prescribed therapeutic regimen will improve Outcome: Completed/Met Goal: Understanding of discharge needs will improve Outcome: Completed/Met   Problem: Activity: Goal: Ability to avoid complications of mobility impairment will improve Outcome: Completed/Met Goal: Ability to tolerate increased activity will improve Outcome: Completed/Met Goal: Will remain free from falls Outcome: Completed/Met   Problem: Bowel/Gastric: Goal: Gastrointestinal status for postoperative course will improve Outcome: Completed/Met   Problem: Clinical Measurements: Goal: Ability to maintain clinical measurements within normal limits will improve Outcome: Completed/Met Goal: Postoperative complications will be avoided or minimized Outcome: Completed/Met Goal: Diagnostic test results will improve Outcome: Completed/Met   Problem: Pain Management: Goal: Pain level will decrease Outcome: Completed/Met   Problem: Skin  Integrity: Goal: Will show signs of wound healing Outcome: Completed/Met   Problem: Health Behavior/Discharge Planning: Goal: Identification of resources available to assist in meeting health care needs will improve Outcome: Completed/Met   Problem: Bladder/Genitourinary: Goal: Urinary functional status for postoperative course will improve Outcome: Completed/Met

## 2022-03-09 ENCOUNTER — Ambulatory Visit: Payer: Self-pay | Admitting: *Deleted

## 2022-03-09 ENCOUNTER — Encounter (HOSPITAL_COMMUNITY): Payer: Self-pay | Admitting: Neurosurgery

## 2022-03-09 NOTE — Patient Outreach (Signed)
  Care Coordination [facility] Note Transition Care Management Follow-up Telephone Call Date of discharge and from where: 03/08/22 FROM [place] How have you been since you were released from the hospital? [facility] Any questions or concerns? No  Items Reviewed: Did the pt receive and understand the discharge instructions provided? Yes  Medications obtained and verified? Yes  Other? No  Any new allergies since your discharge? No  Dietary orders reviewed? Yes Do you have support at home? Yes   Home Care and Equipment/Supplies: Were home health services ordered? no If so, what is the name of the agency? NA  Has the agency set up a time to come to the patient's home? not applicable Were any new equipment or medical supplies ordered?  No What is the name of the medical supply agency? NA Were you able to get the supplies/equipment? not applicable Do you have any questions related to the use of the equipment or supplies? No  Functional Questionnaire: (I = Independent and D = Dependent) ADLs: I  Bathing/Dressing- I  Meal Prep- I  Eating- I  Maintaining continence- I  Transferring/Ambulation- I  Managing Meds- I  Follow up appointments reviewed:  PCP Hospital f/u appt confirmed? No  Scheduled to see DR. SONNENBERG on 05/10/22 @ 1600. [place] [facility] f/u appt confirmed? Yes  Scheduled to see DR. POOL on 03/16/22 @ 1400. Are transportation arrangements needed? No  If their condition worsens, is the pt aware to call PCP or go to the Emergency Dept.? Yes Was the patient provided with contact information for the PCP's office or ED? Yes Was to pt encouraged to call back with questions or concerns? Yes  SDOH assessments and interventions completed:   Yes  Care Coordination Interventions Activated:  Yes Care Coordination Interventions:  ASK PATIENT TO CALL EYE PROVIDER AND REQUEST COPY OF DIABETIC EYE EXAM FROM 08/26/21 BE SENT TO PCP.    Encounter  Outcome:  Pt. Visit Completed  Hubert Azure RN, MSN RN Care Management Coordinator  [place] [phone] [email or web address]

## 2022-03-10 ENCOUNTER — Telehealth: Payer: Self-pay

## 2022-03-10 NOTE — Telephone Encounter (Signed)
Noted. Please let the patient know that he did not meet criteria for patient assistance on the farxiga.

## 2022-03-10 NOTE — Telephone Encounter (Signed)
I called and inforemd the patient that he could not get patient assistance for [facility] because his income exceed the limit.  He understood.  Lucan Riner,cma

## 2022-03-10 NOTE — Telephone Encounter (Signed)
The patient was denied for assistance through [employer] for the [place] because of his income exceeding the limit of 400% of the poverty level.  Ramzey Petrovic,cma

## 2022-03-12 ENCOUNTER — Telehealth: Payer: Self-pay | Admitting: Pharmacy Technician

## 2022-03-12 ENCOUNTER — Telehealth: Payer: Self-pay

## 2022-03-12 DIAGNOSIS — Z596 Low income: Secondary | ICD-10-CM

## 2022-03-12 NOTE — Telephone Encounter (Signed)
-----   Message from Jason Fila, CPhT sent at 03/12/2022 10:19 AM EDT ----- Regarding: patient assistance update Hi Gae Bon,  I see that you received a DENIAL for [place] for Mr. Nickolson.  However, I spoke to [employer] twice this morning  and requested they take a look at his application again and specifically his income and I was able to get the determination reversed.  Patient will be APPROVED per [employer] (1st call)  and confirmed with Monique (2nd call) at [employer].   She said the office should be receiving an updated determination letter.  I have informed the patient.  Thanks,.  Luiz Ochoa. [place], [place]  [phone]

## 2022-03-12 NOTE — Progress Notes (Signed)
[facility])                                            [place] Team    03/12/2022  Justin Patel 07/26/1951 [id number]  Care coordination call placed to [employer] in regard to [facility] application.  Spoke to [employer] who informs patient has been APPROVED 03/10/22-08/08/22. She informs medication should be delivered to the patient's home in the next 10-15 business days.  Successful outreach call placed to patient, HIPAA verified. Patient informs provider's office called and informed he had been denied. Informed patient that the representative I spoke to this morning said he was approved but that I would call [employer] back and if the outcome is different then I would let him know. Called [employer] back and spoke to Eugenio Saenz. Monique confirmed patient was APPROVED as listed above. She informed provider's office should be receiving an updated fax that shows his approval.  Sharee Pimple P. Brielyn Bosak, [place]  [phone]

## 2022-03-12 NOTE — Telephone Encounter (Signed)
Patient was notified of his approval through [employer]. Estefano Victory,cma

## 2022-03-16 ENCOUNTER — Ambulatory Visit: Payer: Medicare PPO

## 2022-03-17 DIAGNOSIS — S92332D Displaced fracture of third metatarsal bone, left foot, subsequent encounter for fracture with routine healing: Secondary | ICD-10-CM | POA: Diagnosis not present

## 2022-03-17 DIAGNOSIS — S92322D Displaced fracture of second metatarsal bone, left foot, subsequent encounter for fracture with routine healing: Secondary | ICD-10-CM | POA: Diagnosis not present

## 2022-03-17 DIAGNOSIS — M79672 Pain in left foot: Secondary | ICD-10-CM | POA: Diagnosis not present

## 2022-03-17 DIAGNOSIS — S92345D Nondisplaced fracture of fourth metatarsal bone, left foot, subsequent encounter for fracture with routine healing: Secondary | ICD-10-CM | POA: Diagnosis not present

## 2022-03-18 ENCOUNTER — Telehealth: Payer: Self-pay

## 2022-03-18 NOTE — Telephone Encounter (Signed)
Patient was approved from 03/10/2022-08/08/2022 for farxiga.  Justin Patel,cma

## 2022-03-23 ENCOUNTER — Other Ambulatory Visit: Payer: Self-pay

## 2022-03-23 DIAGNOSIS — E611 Iron deficiency: Secondary | ICD-10-CM

## 2022-03-24 ENCOUNTER — Inpatient Hospital Stay: Payer: Medicare PPO | Attending: Internal Medicine | Admitting: Internal Medicine

## 2022-03-24 ENCOUNTER — Inpatient Hospital Stay: Payer: Medicare PPO

## 2022-03-24 ENCOUNTER — Encounter: Payer: Self-pay | Admitting: Internal Medicine

## 2022-03-24 VITALS — BP 150/71 | HR 74 | Temp 98.9°F | Resp 18

## 2022-03-24 DIAGNOSIS — E611 Iron deficiency: Secondary | ICD-10-CM

## 2022-03-24 DIAGNOSIS — R5382 Chronic fatigue, unspecified: Secondary | ICD-10-CM | POA: Insufficient documentation

## 2022-03-24 DIAGNOSIS — I509 Heart failure, unspecified: Secondary | ICD-10-CM | POA: Insufficient documentation

## 2022-03-24 DIAGNOSIS — N189 Chronic kidney disease, unspecified: Secondary | ICD-10-CM | POA: Diagnosis not present

## 2022-03-24 DIAGNOSIS — G8929 Other chronic pain: Secondary | ICD-10-CM | POA: Insufficient documentation

## 2022-03-24 DIAGNOSIS — I130 Hypertensive heart and chronic kidney disease with heart failure and stage 1 through stage 4 chronic kidney disease, or unspecified chronic kidney disease: Secondary | ICD-10-CM | POA: Insufficient documentation

## 2022-03-24 DIAGNOSIS — E1122 Type 2 diabetes mellitus with diabetic chronic kidney disease: Secondary | ICD-10-CM | POA: Insufficient documentation

## 2022-03-24 DIAGNOSIS — D649 Anemia, unspecified: Secondary | ICD-10-CM | POA: Diagnosis not present

## 2022-03-24 LAB — CBC WITH DIFFERENTIAL/PLATELET
Abs Immature Granulocytes: 0.05 10*3/uL (ref 0.00–0.07)
Basophils Absolute: 0.1 10*3/uL (ref 0.0–0.1)
Basophils Relative: 1 %
Eosinophils Absolute: 0.2 10*3/uL (ref 0.0–0.5)
Eosinophils Relative: 1 %
HCT: 39.5 % (ref 39.0–52.0)
Hemoglobin: 12.4 g/dL — ABNORMAL LOW (ref 13.0–17.0)
Immature Granulocytes: 1 %
Lymphocytes Relative: 10 %
Lymphs Abs: 1.1 10*3/uL (ref 0.7–4.0)
MCH: 30.6 pg (ref 26.0–34.0)
MCHC: 31.4 g/dL (ref 30.0–36.0)
MCV: 97.5 fL (ref 80.0–100.0)
Monocytes Absolute: 0.7 10*3/uL (ref 0.1–1.0)
Monocytes Relative: 7 %
Neutro Abs: 8.9 10*3/uL — ABNORMAL HIGH (ref 1.7–7.7)
Neutrophils Relative %: 80 %
Platelets: 315 10*3/uL (ref 150–400)
RBC: 4.05 MIL/uL — ABNORMAL LOW (ref 4.22–5.81)
RDW: 17.4 % — ABNORMAL HIGH (ref 11.5–15.5)
WBC: 10.9 10*3/uL — ABNORMAL HIGH (ref 4.0–10.5)
nRBC: 0 % (ref 0.0–0.2)

## 2022-03-24 LAB — BASIC METABOLIC PANEL
Anion gap: 7 (ref 5–15)
BUN: 21 mg/dL (ref 8–23)
CO2: 25 mmol/L (ref 22–32)
Calcium: 8.7 mg/dL — ABNORMAL LOW (ref 8.9–10.3)
Chloride: 105 mmol/L (ref 98–111)
Creatinine, Ser: 1.2 mg/dL (ref 0.61–1.24)
GFR, Estimated: >60 mL/min (ref >60)
Glucose, Bld: 194 mg/dL — ABNORMAL HIGH (ref 70–99)
Potassium: 4 mmol/L (ref 3.5–5.1)
Sodium: 137 mmol/L (ref 135–145)

## 2022-03-24 LAB — VITAMIN B12: Vitamin B-12: 230 pg/mL (ref 180–914)

## 2022-03-24 MED ORDER — SODIUM CHLORIDE 0.9 % IV SOLN
200.0000 mg | Freq: Once | INTRAVENOUS | Status: AC
  Administered 2022-03-24: 200 mg via INTRAVENOUS
  Filled 2022-03-24: qty 200

## 2022-03-24 MED ORDER — SODIUM CHLORIDE 0.9 % IV SOLN
Freq: Once | INTRAVENOUS | Status: AC
  Filled 2022-03-24: qty 250

## 2022-03-24 NOTE — Assessment & Plan Note (Addendum)
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 [PCP; Hb12]-likely secondary to malabsorption.  Patient notes that symptomatic improvement of fatigue.  S/p IV iron. H today 12- proceed with venofer.  #Continued chronic fatigue worsened likely secondary to malabsorption/gastric bypass.  Check vitamin D levels; copper and zinc next visit.   # Chronic back pain-status post surgery clinically stable.  # DISPOSITION; # venofer today # venofer weekly x 2  # follow up in 3 months- MD; labs- cbc/bmp; b12; vit D 25- [place]; copper; zinc levels possible IV venofer--Dr.B

## 2022-03-24 NOTE — Progress Notes (Signed)
Patient had his 4th back surgery since last visit with back pain 7/10 today.  Temp 100.0 today.

## 2022-03-24 NOTE — Progress Notes (Signed)
Justin Patel NOTE  Patient Care Team: Justin Haven, MD as PCP - General (Family Medicine) Justin Partridge, DO as Consulting Physician (Neurology) Justin Sickle, MD as Consulting Physician (Oncology)  CHIEF COMPLAINTS/PURPOSE OF CONSULTATION: ANEMIA   HEMATOLOGY HISTORY  # ANEMIA [Hb; MCV-platelets- WBC; Iron sat; ferritin;  GFR- CT/US; FEB 2023- EGD [[facility]]/- Evidence of a Roux-en-Y gastrojejunostomy was found. The gastrojejunal anastomosis was characterized by healthy appearing mucosa. The jejunojejunal anastomosis was characterized by healthy appearing mucosa. Previous gastric ulcer has healed. 2019-Colonoscopy   Latest Reference Range & Units 01/05/22 11:13  TIBC 250.0 - 450.0 mcg/dL 533.4 (H)  Saturation Ratios 20.0 - 50.0 % 10.3 (L)  Ferritin 22.0 - 322.0 ng/mL 12.5 (L)  Transferrin 212.0 - 360.0 mg/dL 381.0 (H)  (H): Data is abnormally high (L): Data is abnormally low  # Leucocytosis/weight loss/extreme fatigue-bone marrow biopsy-June 2019-unremarkable.   #Mild macrocytosis without anemia-question copper deficiency/gastric bypass; recommend p.o. copper.   #  SDH [Alpha- 7035]; gastric Bypass Surgery    Latest Reference Range & Units 01/05/22 11:13  TIBC 250.0 - 450.0 mcg/dL 533.4 (H)  Saturation Ratios 20.0 - 50.0 % 10.3 (L)  Ferritin 22.0 - 322.0 ng/mL 12.5 (L)  Transferrin 212.0 - 360.0 mg/dL 381.0 (H)  (H): Data is abnormally high (L): Data is abnormally low   No history exists.    Latest Reference Range & Units 01/05/22 11:13  WBC 4.0 - 10.5 K/uL 8.4  RBC 4.22 - 5.81 Mil/uL 4.19 (L)  Hemoglobin 13.0 - 17.0 g/dL 12.3 (L)  HCT 39.0 - 52.0 % 38.3 (L)  MCV 78.0 - 100.0 fl 91.4  MCHC 30.0 - 36.0 g/dL 32.0  RDW 11.5 - 15.5 % 16.7 (H)  Platelets 150.0 - 400.0 K/uL 207.0  (L): Data is abnormally low (H): Data is abnormally high  HISTORY OF PRESENTING ILLNESS: Patient ambulating dependently.  Alone.   Justin Patel 71 y.o.   male pleasant patient with a history of gastric bypass -anemia is here for follow-up.  In the clinic patient had his 4th back surgery since last visit with back pain 7/10 today. No evidence of any acute process.  Otherwise denies any blood in stools or black-colored stools.   Review of Systems  Constitutional:  Positive for malaise/fatigue. Negative for chills, diaphoresis, fever and weight loss.  HENT:  Negative for nosebleeds and sore throat.   Eyes:  Negative for double vision.  Respiratory:  Negative for cough, hemoptysis, sputum production, shortness of breath and wheezing.   Cardiovascular:  Negative for chest pain, palpitations, orthopnea and leg swelling.  Gastrointestinal:  Negative for abdominal pain, blood in stool, constipation, diarrhea, heartburn, melena, nausea and vomiting.  Genitourinary:  Negative for dysuria, frequency and urgency.  Musculoskeletal:  Positive for back pain and joint pain.  Skin: Negative.  Negative for itching and rash.  Neurological:  Negative for dizziness, tingling, focal weakness, weakness and headaches.  Endo/Heme/Allergies:  Does not bruise/bleed easily.  Psychiatric/Behavioral:  Negative for depression. The patient is not nervous/anxious and does not have insomnia.      MEDICAL HISTORY:  Past Medical History:  Diagnosis Date  . 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 Anxiety   . Arthritis   . 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  removal scheduled to clear margins.  . CHF (congestive heart failure) ([place])    no longer after weight loss  . Chronic  kidney disease    cysts  . Degenerative disc disease   . Depression   . Diabetes mellitus    no longer diabetic-or on meds  . DJD (degenerative joint disease)   . Dysplastic nevus 02/04/2016   R upper back paraspinal - severe  . Family history of adverse reaction to anesthesia    sons wake up combative  . Foot fracture, left 01/2022  .  Gastric ulcer   . GERD (gastroesophageal reflux disease)   . H/O hiatal hernia   . Headache(784.0)    sinus  . Heart murmur   . Hx of congestive heart failure   . Hyperlipidemia   . Hypertension   . Iron deficiency anemia 02/28/2015  . Lumbar scoliosis   . Opiate abuse, continuous ([place]) 06/20/2015  . Sacral fracture, closed ([place])   . Seizures ([place])    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, after GI bleed  . Sleep apnea    hx not now since wt loss  . Stones in the urinary tract   . Syncope and collapse     SURGICAL HISTORY: Past Surgical History:  Procedure Laterality Date  . ANTERIOR CERVICAL DECOMP/DISCECTOMY FUSION  01/26/2012   Procedure: ANTERIOR CERVICAL DECOMPRESSION/DISCECTOMY FUSION 3 LEVELS;  Surgeon: Justin Stakes, MD;  [facility]: [facility];  Service: Neurosurgery;  Laterality: N/A;  Cervical three-four Cervical four-five Cervical five-six Cervical six-seven , Anterior cervical decompression/diskectomy, fusion, plate  . ANTERIOR LAT LUMBAR FUSION Right 04/25/2018   Procedure: Right Lumbar two-three Lumbar three-four Lumbar four-five anterior  lateral interbody fusion  with posterior percutaneous pedicle screws;  Surgeon: Justin Levine, MD;  Location: [place];  Service: Neurosurgery;  Laterality: Right;  . APPENDECTOMY    . [place], normal  . CARDIAC CATHETERIZATION     [place]    . COLONOSCOPY WITH PROPOFOL N/A 12/27/2017   Procedure: COLONOSCOPY WITH PROPOFOL;  Surgeon: Tol[place], Benay Pike, MD;  Location: [facility];  Service: Gastroenterology;  Laterality: N/A;  . ESOPHAGOGASTRODUODENOSCOPY (EGD) WITH PROPOFOL N/A 12/27/2017   Procedure: ESOPHAGOGASTRODUODENOSCOPY (EGD) WITH PROPOFOL;  Surgeon: Toledo, Benay Pike, MD;  Location: [facility];  Service: Gastroenterology;  Laterality: N/A;  . ESOPHAGOGASTRODUODENOSCOPY (EGD) WITH PROPOFOL N/A 06/19/2021   Procedure: ESOPHAGOGASTRODUODENOSCOPY (EGD) WITH  PROPOFOL;  Surgeon: Justin Rubenstein, MD;  Location: [facility];  Service: Gastroenterology;  Laterality: N/A;  . ESOPHAGOGASTRODUODENOSCOPY (EGD) WITH PROPOFOL N/A 09/25/2021   Procedure: ESOPHAGOGASTRODUODENOSCOPY (EGD) WITH PROPOFOL;  Surgeon: Justin Rubenstein, MD;  Location: [facility];  Service: Endoscopy;  Laterality: N/A;  . GASTRIC BYPASS  08/09/2008   [employer]  . HARDWARE REMOVAL N/A 03/08/2022   Procedure: Removal of bilateral Thoracic ten pedicle screws;  Surgeon: Earnie Larsson, MD;  Location: [place];  Service: Neurosurgery;  Laterality: N/A;  . HERNIA REPAIR  08/09/1998   hiatal  . JOINT REPLACEMENT    . KNEE ARTHROSCOPY     bilateral, left x 2  . LAMINECTOMY WITH POSTERIOR LATERAL ARTHRODESIS LEVEL 4 N/A 01/14/2020   Procedure: Decompressive Laminectomy Lumbar One with pedicle screw fixation from Thoracic Ten to Lumbar Two;  Surgeon: Justin Levine, MD;  Location: [place];  Service: Neurosurgery;  Laterality: N/A;  Decompressive Laminectomy Lumbar One with pedicle screw fixation from Thoracic Ten to Lumbar Two  . LUMBAR PERCUTANEOUS PEDICLE SCREW 3 LEVEL N/A 04/25/2018   Procedure: LUMBAR PERCUTANEOUS PEDICLE SCREW 3 LEVEL;  Surgeon: Justin Levine, MD;  Location: [place];  Service: Neurosurgery;  Laterality: N/A;  . NASAL SINUS SURGERY     x5  . OSTEOTOMY  08/10/1999   left  . ROUX-EN-Y GASTRIC BYPASS    . TONSILLECTOMY    . TOTAL KNEE ARTHROPLASTY Left 05/09/2014   Justin. Marry Guan  . UVULOPALATOPLASTY  08/09/2009    SOCIAL HISTORY: Social History   Socioeconomic History  . Marital status: Married    Spouse name: Not on file  . Number of children: 2  . Years of education: Not on file  . Highest education level: Not on file  Occupational History  . Not on file  Tobacco Use  . Smoking status: Never  . Smokeless tobacco: Never  Vaping Use  . Vaping Use: Never used  Substance and Sexual Activity  . Alcohol use: Yes    Comment: occassionally  . Drug use: No   . Sexual activity: Yes    Partners: Female  Other Topics Concern  . Not on file  Social History Narrative   Lives in [place] with wife, [place]. 2 sons, William Hamburger, Lennette Bihari 34.. 4 grandchildren      Work - Retired, previously taught music in [place] - regular diet, limited quantities after gastric bypass      Exercise - no regular, limited by arthritis in knees, occasional water aerobics   Right handed   One story house   Social Determinants of Health   Financial Resource Strain: Medium Risk (03/09/2022)   Overall Financial Resource Strain (CARDIA)   . Difficulty of Paying Living Expenses: Somewhat hard  Food Insecurity: No Food Insecurity (03/09/2022)   Hunger Vital Sign   . Worried About Charity fundraiser in the Last Year: Never true   . Ran Out of Food in the Last Year: Never true  Transportation Needs: No Transportation Needs (03/09/2022)   PRAPARE - Transportation   . Lack of Transportation (Medical): No   . Lack of Transportation (Non-Medical): No  Physical Activity: Unknown (06/22/2021)   Exercise Vital Sign   . Days of Exercise per Week: 0 days   . Minutes of Exercise per Session: Not on file  Stress: No Stress Concern Present (06/22/2021)   [place]   . Feeling of Stress : Not at all  Social Connections: Unknown (06/22/2021)   Social Connection and Isolation Panel [NHANES]   . Frequency of Communication with Friends and Family: Not on file   . Frequency of Social Gatherings with Friends and Family: More than three times a week   . Attends Religious Services: Not on file   . Active Member of Clubs or Organizations: Not on file   . Attends Archivist Meetings: Not on file   . Marital Status: Married  Human resources officer Violence: Not At Risk (06/22/2021)   Humiliation, Afraid, Rape, and Kick questionnaire   . Fear of Current or Ex-Partner: No   . Emotionally Abused:  No   . Physically Abused: No   . Sexually Abused: No    FAMILY HISTORY: Family History  Problem Relation Age of Onset  . Dementia Mother 26  . Osteoporosis Mother   . Heart disease Father 81  . Diabetes Father   . Lymphoma Sister        lymphoma, stage 4  . Ovarian cancer Sister 8       Ovarian  . Lupus Sister   . Prostate cancer Maternal Uncle   . Skin cancer  Maternal Uncle   . Tongue cancer Maternal Uncle        uncle died of heart attack  . Alcoholism Maternal Uncle   . Lung cancer Paternal Aunt        pat aunts x 4 died of lung cancer  . Lung cancer Paternal Aunt   . Lung cancer Paternal Aunt   . Lung cancer Paternal Aunt   . Mesothelioma Cousin        paternal cousin    ALLERGIES:  is allergic to altace [ramipril], ace inhibitors, levaquin [levofloxacin in d5w], lyrica [pregabalin], metformin, and trulicity [dulaglutide].  MEDICATIONS:  Current Outpatient Medications  Medication Sig Dispense Refill  . acetaminophen (TYLENOL) 325 MG tablet Take 650 mg by mouth every 6 (six) hours as needed for moderate pain.    . blood glucose meter kit and supplies KIT Dispense based on patient and insurance preference. Check once daily. ICD doing E11.9. 1 each 0  . Calcium Carbonate-Vitamin D (CALCIUM 600/VITAMIN D PO) Take 1 tablet by mouth daily.    . cyclobenzaprine (FLEXERIL) 5 MG tablet TAKE 1 TABLET BY MOUTH EVERY 8 HOURS AS NEEDED FOR SPASMS. 30 tablet 1  . dapagliflozin propanediol (FARXIGA) 10 MG TABS tablet Take 1 tablet (10 mg total) by mouth daily before breakfast. 90 tablet 1  . esomeprazole (NEXIUM) 40 MG capsule TAKE 1 CAPSULE BY MOUTH ONCE DAILY 90 capsule 1  . ezetimibe (ZETIA) 10 MG tablet TAKE ONE TABLET BY MOUTH EVERY DAY 90 tablet 1  . furosemide (LASIX) 20 MG tablet TAKE ONE TABLET BY MOUTH EVERY DAY 90 tablet 1  . gabapentin (NEURONTIN) 300 MG capsule Take 1 capsule (300 mg total) by mouth 3 (three) times daily. 270 capsule 1  . metoprolol succinate  (TOPROL-XL) 50 MG 24 hr tablet TAKE 1 TABLET BY MOUTH DAILY WITH OR IMMEDIATLY FOLLOWING A MEAL 90 tablet 1  . metroNIDAZOLE (METROGEL) 0.75 % gel Apply 1 application topically 2 (two) times daily. For rosacea - apply to the cheeks, chin, and nose BID. (Patient taking differently: Apply 1 application  topically 2 (two) times daily as needed (rosacea). apply to the cheeks, chin, and nose) 45 g 3  . Multiple Vitamin (MULTIVITAMIN) capsule Take 1 capsule by mouth daily. With copper and zinc    . nystatin-triamcinolone ointment (MYCOLOG) Apply 1 application topically 2 (two) times daily. (Patient taking differently: Apply 1 application  topically daily as needed (irritation).) 30 g 0  . primidone (MYSOLINE) 50 MG tablet Take 1 tablet (50 mg total) by mouth at bedtime. 30 tablet 11  . rosuvastatin (CRESTOR) 40 MG tablet TAKE ONE TABLET EVERY DAY 90 tablet 0  . sucralfate (CARAFATE) 1 g tablet TAKE 1 TABLET BY MOUTH 4 TIMES DAILY 360 tablet 1  . tamsulosin (FLOMAX) 0.4 MG CAPS capsule TAKE 1 CAPSULE BY MOUTH EVERY DAY 90 capsule 3  . TRADJENTA 5 MG TABS tablet TAKE 1 TABLET BY MOUTH DAILY 90 tablet 1  . traMADol (ULTRAM) 50 MG tablet TAKE 1-2 TABLETS BY MOUTH EVERY 4-6 HOURS AS NEEDED 180 tablet 0  . venlafaxine XR (EFFEXOR-XR) 75 MG 24 hr capsule TAKE ONE CAPSULE EVERY MORNING WITH BREAKFAST 90 capsule 1   No current facility-administered medications for this visit.     Marland Kitchen  PHYSICAL EXAMINATION:   Vitals:   03/24/22 1300  BP: (!) 146/87  Pulse: 88  Resp: 18  Temp: 100 F (37.8 C)   Filed Weights   03/24/22 1300  Weight: 179 lb  3.2 oz (81.3 kg)    Physical Exam Vitals and nursing note reviewed.  HENT:     Head: Normocephalic and atraumatic.     Mouth/Throat:     Pharynx: Oropharynx is clear.  Eyes:     Extraocular Movements: Extraocular movements intact.     Pupils: Pupils are equal, round, and reactive to light.  Cardiovascular:     Rate and Rhythm: Normal rate and regular  rhythm.  Pulmonary:     Comments: Decreased breath sounds bilaterally.  Abdominal:     Palpations: Abdomen is soft.  Musculoskeletal:        General: Normal range of motion.     Cervical back: Normal range of motion.  Skin:    General: Skin is warm.  Neurological:     General: No focal deficit present.     Mental Status: He is alert and oriented to person, place, and time.  Psychiatric:        Behavior: Behavior normal.        Judgment: Judgment normal.     LABORATORY DATA:  I have reviewed the data as listed Lab Results  Component Value Date   WBC 10.9 (H) 03/24/2022   HGB 12.4 (L) 03/24/2022   HCT 39.5 03/24/2022   MCV 97.5 03/24/2022   PLT 315 03/24/2022   Recent Labs    06/06/21 1821 12/17/21 1731 12/28/21 1020 03/04/22 1500 03/24/22 1245  NA 138 137 137 140 137  K 4.1 3.5 4.9 4.9 4.0  CL 100 106 102 103 105  CO2 _0 GLUCOSE 131* 93 130* 100* 194*  BUN 17 27* 20 30* 21  CREATININE 1.00 1.92* 1.42 1.36* 1.20  CALCIUM 9.5 9.3 8.9 9.4 8.7*  GFRNONAA >60 37*  --  56* >60  PROT 7.3  --   --   --   --   ALBUMIN 4.0  --   --   --   --   AST 31  --   --   --   --   ALT 31  --   --   --   --   ALKPHOS 41  --   --   --   --   BILITOT 0.6  --   --   --   --      No results found.  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 [PCP; Hb12]-likely secondary to malabsorption.  Patient notes that symptomatic improvement of fatigue.  S/p IV iron. H today 12- proceed with venofer.  #Continued chronic fatigue worsened likely secondary to malabsorption/gastric bypass.  Check vitamin D levels; copper and zinc next visit.   # Chronic back pain-status post surgery clinically stable.  # DISPOSITION; # venofer today # venofer weekly x 2  # follow up in 3 months- MD; labs- cbc/bmp; b12; vit D 25- OH; copper; zinc levels possible IV venofer--Justin.B    All questions were answered. The  patient knows to call the clinic with any problems, questions or concerns.    Justin Sickle, MD 03/24/2022 3:45 PM

## 2022-03-26 ENCOUNTER — Emergency Department
Admission: EM | Admit: 2022-03-26 | Discharge: 2022-03-28 | Disposition: A | Discharge status: Other Acute Inpatient Hospital | Payer: Medicare PPO | Attending: Emergency Medicine | Admitting: Emergency Medicine

## 2022-03-26 ENCOUNTER — Emergency Department: Payer: Medicare PPO

## 2022-03-26 ENCOUNTER — Other Ambulatory Visit: Payer: Self-pay

## 2022-03-26 ENCOUNTER — Encounter (HOSPITAL_COMMUNITY): Payer: Self-pay

## 2022-03-26 DIAGNOSIS — M549 Dorsalgia, unspecified: Secondary | ICD-10-CM

## 2022-03-26 DIAGNOSIS — M546 Pain in thoracic spine: Secondary | ICD-10-CM | POA: Insufficient documentation

## 2022-03-26 DIAGNOSIS — Y69 Unspecified misadventure during surgical and medical care: Secondary | ICD-10-CM | POA: Insufficient documentation

## 2022-03-26 DIAGNOSIS — M545 Low back pain, unspecified: Secondary | ICD-10-CM | POA: Diagnosis not present

## 2022-03-26 DIAGNOSIS — G8918 Other acute postprocedural pain: Secondary | ICD-10-CM | POA: Diagnosis not present

## 2022-03-26 DIAGNOSIS — T8189XA Other complications of procedures, not elsewhere classified, initial encounter: Secondary | ICD-10-CM | POA: Insufficient documentation

## 2022-03-26 DIAGNOSIS — M40204 Unspecified kyphosis, thoracic region: Secondary | ICD-10-CM | POA: Diagnosis not present

## 2022-03-26 DIAGNOSIS — M47814 Spondylosis without myelopathy or radiculopathy, thoracic region: Secondary | ICD-10-CM | POA: Diagnosis not present

## 2022-03-26 DIAGNOSIS — Z981 Arthrodesis status: Secondary | ICD-10-CM | POA: Diagnosis not present

## 2022-03-26 LAB — CBC
HCT: 40.9 % (ref 39.0–52.0)
Hemoglobin: 12.5 g/dL — ABNORMAL LOW (ref 13.0–17.0)
MCH: 29.6 pg (ref 26.0–34.0)
MCHC: 30.6 g/dL (ref 30.0–36.0)
MCV: 96.7 fL (ref 80.0–100.0)
Platelets: 370 10*3/uL (ref 150–400)
RBC: 4.23 MIL/uL (ref 4.22–5.81)
RDW: 17.3 % — ABNORMAL HIGH (ref 11.5–15.5)
WBC: 9.3 10*3/uL (ref 4.0–10.5)
nRBC: 0 % (ref 0.0–0.2)

## 2022-03-26 LAB — COMPREHENSIVE METABOLIC PANEL
ALT: 20 U/L (ref 0–44)
AST: 22 U/L (ref 15–41)
Albumin: 3.4 g/dL — ABNORMAL LOW (ref 3.5–5.0)
Alkaline Phosphatase: 60 U/L (ref 38–126)
Anion gap: 10 (ref 5–15)
BUN: 18 mg/dL (ref 8–23)
CO2: 23 mmol/L (ref 22–32)
Calcium: 9.3 mg/dL (ref 8.9–10.3)
Chloride: 105 mmol/L (ref 98–111)
Creatinine, Ser: 1.13 mg/dL (ref 0.61–1.24)
GFR, Estimated: >60 mL/min (ref >60)
Glucose, Bld: 149 mg/dL — ABNORMAL HIGH (ref 70–99)
Potassium: 3.5 mmol/L (ref 3.5–5.1)
Sodium: 138 mmol/L (ref 135–145)
Total Bilirubin: 0.5 mg/dL (ref 0.3–1.2)
Total Protein: 7.4 g/dL (ref 6.5–8.1)

## 2022-03-26 LAB — CBC WITH DIFFERENTIAL/PLATELET
Abs Immature Granulocytes: 0.04 10*3/uL (ref 0.00–0.07)
Basophils Absolute: 0.1 10*3/uL (ref 0.0–0.1)
Basophils Relative: 1 %
Eosinophils Absolute: 0.3 10*3/uL (ref 0.0–0.5)
Eosinophils Relative: 3 %
HCT: 40.8 % (ref 39.0–52.0)
Hemoglobin: 12.5 g/dL — ABNORMAL LOW (ref 13.0–17.0)
Immature Granulocytes: 0 %
Lymphocytes Relative: 15 %
Lymphs Abs: 1.5 10*3/uL (ref 0.7–4.0)
MCH: 30.2 pg (ref 26.0–34.0)
MCHC: 30.6 g/dL (ref 30.0–36.0)
MCV: 98.6 fL (ref 80.0–100.0)
Monocytes Absolute: 0.9 10*3/uL (ref 0.1–1.0)
Monocytes Relative: 9 %
Neutro Abs: 6.8 10*3/uL (ref 1.7–7.7)
Neutrophils Relative %: 72 %
Platelets: 383 10*3/uL (ref 150–400)
RBC: 4.14 MIL/uL — ABNORMAL LOW (ref 4.22–5.81)
RDW: 17.5 % — ABNORMAL HIGH (ref 11.5–15.5)
WBC: 9.4 10*3/uL (ref 4.0–10.5)
nRBC: 0 % (ref 0.0–0.2)

## 2022-03-26 LAB — SEDIMENTATION RATE: Sed Rate: 66 mm/hr — ABNORMAL HIGH (ref 0–20)

## 2022-03-26 LAB — PROCALCITONIN: Procalcitonin: 0.4 ng/mL

## 2022-03-26 MED ORDER — VANCOMYCIN HCL IN DEXTROSE 1-5 GM/200ML-% IV SOLN
1000.0000 mg | Freq: Once | INTRAVENOUS | Status: AC
Start: 2022-03-26 — End: 2022-03-26
  Administered 2022-03-26: 1000 mg via INTRAVENOUS
  Filled 2022-03-26: qty 200

## 2022-03-26 MED ORDER — HYDROMORPHONE HCL 1 MG/ML IJ SOLN
0.5000 mg | Freq: Once | INTRAMUSCULAR | Status: AC
  Administered 2022-03-26: 0.5 mg via INTRAVENOUS
  Filled 2022-03-26: qty 0.5

## 2022-03-26 MED ORDER — MORPHINE SULFATE (PF) 4 MG/ML IV SOLN
4.0000 mg | INTRAVENOUS | Status: DC | PRN
  Administered 2022-03-26 – 2022-03-28 (×10): 4 mg via INTRAVENOUS
  Filled 2022-03-26 (×10): qty 1

## 2022-03-26 MED ORDER — ACETAMINOPHEN 325 MG PO TABS
650.0000 mg | ORAL_TABLET | Freq: Once | ORAL | Status: AC
  Administered 2022-03-26: 650 mg via ORAL
  Filled 2022-03-26: qty 2

## 2022-03-26 MED ORDER — GADOBUTROL 1 MMOL/ML IV SOLN
8.0000 mL | Freq: Once | INTRAVENOUS | Status: AC | PRN
  Administered 2022-03-26: 8 mL via INTRAVENOUS

## 2022-03-26 MED ORDER — ONDANSETRON HCL 4 MG/2ML IJ SOLN
4.0000 mg | Freq: Once | INTRAMUSCULAR | Status: AC
  Administered 2022-03-26: 4 mg via INTRAVENOUS
  Filled 2022-03-26: qty 2

## 2022-03-26 MED ORDER — MORPHINE SULFATE (PF) 4 MG/ML IV SOLN
4.0000 mg | Freq: Once | INTRAVENOUS | Status: AC
  Administered 2022-03-26: 4 mg via INTRAVENOUS
  Filled 2022-03-26: qty 1

## 2022-03-26 NOTE — ED Notes (Signed)
Patient returns from MRI in no acute distress.

## 2022-03-26 NOTE — ED Triage Notes (Signed)
Pt had a spinal fusion in July, pt is now having some drainage from the incision. Pt was sent from [facility], pt states that he was unable to get in touch with his neurosurgeon in [place] this am

## 2022-03-26 NOTE — ED Notes (Signed)
[facility] called back and patient accepted to [facility], waiting for bed assignment 1845

## 2022-03-26 NOTE — ED Provider Notes (Signed)
[facility] Provider Note    Event Date/Time   First MD Initiated Contact with Patient 03/26/22 1131     (approximate)   History   Spinal Issue   HPI  Justin Patel is a 71 y.o. male who had removal of loosened pedicle screws in T10 on the 31st of last month.  Patient woke up this morning and seeing some blood and clear drainage from the area today and having a lot of pain.  Has no fever no numbness or weakness but pain in the back.  There is some drainage showing on the other side of the gauze pad that was put on.      Physical Exam   Triage Vital Signs: ED Triage Vitals  Enc Vitals Group     BP 03/26/22 1048 (!) 157/118     Pulse Rate 03/26/22 1048 94     Resp 03/26/22 1048 18     Temp 03/26/22 1048 98.6 F (37 C)     Temp Source 03/26/22 1048 Oral     SpO2 03/26/22 1048 95 %     Weight 03/26/22 1049 179 lb 3.7 oz (81.3 kg)     Height 03/26/22 1049 '5\' 5"'$  (1.651 m)     Head Circumference --      Peak Flow --      Pain Score 03/26/22 1049 8     Pain Loc --      Pain Edu? --      Excl. in [place]? --     Most recent vital signs: Vitals:   03/26/22 1645 03/26/22 1700  BP:  (!) 141/78  Pulse: 81 78  Resp:    Temp:    SpO2: 100% 98%     General: Awake, alert complaining of pain in his back CV:  Good peripheral perfusion.  Heart regular rate and rhythm no audible murmurs Resp:  Normal effort.  Lungs are clear Abd:  No distention.  Soft bowel sounds positive not tender Back: There are some areas with some erythema and chronic skin irritation changes at about T10.  On the right side there is approximately 1 and half centimeter long area where the skin has separated there is some whitish tissue showing through there is a little bit of erythema surrounding it.  There is no purulent drainage at this minute but there are some on the dressing.    ED Results / Procedures / Treatments   Labs (all labs ordered are listed, but only abnormal  results are displayed) Labs Reviewed  CBC - Abnormal; Notable for the following components:      Result Value   Hemoglobin 12.5 (*)    RDW 17.3 (*)    All other components within normal limits  COMPREHENSIVE METABOLIC PANEL - Abnormal; Notable for the following components:   Glucose, Bld 149 (*)    Albumin 3.4 (*)    All other components within normal limits  DIFFERENTIAL  SEDIMENTATION RATE     EKG     RADIOLOGY    PROCEDURES:  Critical Care performed:   Procedures   MEDICATIONS ORDERED IN ED: Medications  HYDROmorphone (DILAUDID) injection 0.5 mg (0.5 mg Intravenous Given 03/26/22 1152)  ondansetron (ZOFRAN) injection 4 mg (4 mg Intravenous Given 03/26/22 1151)  HYDROmorphone (DILAUDID) injection 0.5 mg (0.5 mg Intravenous Given 03/26/22 1500)     IMPRESSION / MDM / ASSESSMENT AND PLAN / ED COURSE  I reviewed the triage vital signs and the nursing notes. They  are waiting for the differential and the sed rate which had been ordered quite some time ago to come back.  I have signed the patient out to oncoming provider.  Differential diagnosis includes, but is not limited to, irritation from the hardware or cellulitis or deeper soft tissue infection or infected hardware and or osteomyelitis are all possibilities.  There are no symptoms consistent with a epi or subdural infection or anything more serious however.  Patient's presentation is most consistent with acute presentation with potential threat to life or bodily function.  he patient is on the cardiac monitor to evaluate for evidence of arrhythmia and/or significant heart rate changes.* Clinical Course as of 03/26/22 1705  Fri Mar 26, 2022  1637 Sedimentation rate [PM]    Clinical Course User Index [PM] Nena Polio, MD     FINAL CLINICAL IMPRESSION(S) / ED DIAGNOSES   Final diagnoses:  Acute midline back pain, unspecified back location  Draining postoperative wound, initial encounter     Rx / DC  Orders   ED Discharge Orders     None        Note:  This document was prepared using Dragon voice recognition software and may include unintentional dictation errors.   Nena Polio, MD 03/26/22 [phone]

## 2022-03-26 NOTE — ED Notes (Signed)
Patient went to MRI in no acute distress.

## 2022-03-26 NOTE — ED Provider Notes (Signed)
-----------------------------------------   6:22 PM on 03/26/2022 ----------------------------------------- Patient care assumed from Dr. Cinda Quest.  Patient's labs have resulted showing a normal CBC with a normal white blood cell count and a overall reassuring differential.  Procalcitonin is normal as well.  Patient's sedimentation rate did come back elevated.  I spoke with Dr. Dory Larsen of neurosurgery at [facility] neurosurgery and spine.  He states the patient should be started on antibiotics we will start on IV vancomycin.  He is requesting that the patient be transferred to [facility].  I will speak to the hospitalist regarding transfer and they will be consulting on the patient once he arrives at [facility].  We will continue to treat the patient's discomfort in the emergency department.  We will attempt to obtain an MRI with without contrast of the thoracic spine prior to transfer.  Patient agreeable to plan of care.   Harvest Dark, MD 03/26/22 [phone]

## 2022-03-26 NOTE — ED Notes (Signed)
Report given to Elaina RN

## 2022-03-26 NOTE — ED Triage Notes (Signed)
Pt had a recent spinal fusion done in July. Pt states he woke up this morning seeing blood and clear fluid coming from the right side. Pt also having a lot of pain, denies N/V and fever.

## 2022-03-26 NOTE — ED Notes (Signed)
Called [facility] spoke to [facility]  for transfer 1801

## 2022-03-26 NOTE — ED Notes (Signed)
Pt states drainage from site of spinal infusion. Pt states it was draining a lot. Pt has bandage in place with no drainage present at this time through bandage.

## 2022-03-27 LAB — CBC WITH DIFFERENTIAL/PLATELET
Abs Immature Granulocytes: 0.04 10*3/uL (ref 0.00–0.07)
Basophils Absolute: 0 10*3/uL (ref 0.0–0.1)
Basophils Relative: 0 %
Eosinophils Absolute: 0.2 10*3/uL (ref 0.0–0.5)
Eosinophils Relative: 2 %
HCT: 36.3 % — ABNORMAL LOW (ref 39.0–52.0)
Hemoglobin: 11.3 g/dL — ABNORMAL LOW (ref 13.0–17.0)
Immature Granulocytes: 1 %
Lymphocytes Relative: 15 %
Lymphs Abs: 1.3 10*3/uL (ref 0.7–4.0)
MCH: 30.4 pg (ref 26.0–34.0)
MCHC: 31.1 g/dL (ref 30.0–36.0)
MCV: 97.6 fL (ref 80.0–100.0)
Monocytes Absolute: 0.7 10*3/uL (ref 0.1–1.0)
Monocytes Relative: 9 %
Neutro Abs: 6.3 10*3/uL (ref 1.7–7.7)
Neutrophils Relative %: 73 %
Platelets: 315 10*3/uL (ref 150–400)
RBC: 3.72 MIL/uL — ABNORMAL LOW (ref 4.22–5.81)
RDW: 17.2 % — ABNORMAL HIGH (ref 11.5–15.5)
WBC: 8.5 10*3/uL (ref 4.0–10.5)
nRBC: 0 % (ref 0.0–0.2)

## 2022-03-27 LAB — BASIC METABOLIC PANEL
Anion gap: 7 (ref 5–15)
BUN: 14 mg/dL (ref 8–23)
CO2: 26 mmol/L (ref 22–32)
Calcium: 8.8 mg/dL — ABNORMAL LOW (ref 8.9–10.3)
Chloride: 103 mmol/L (ref 98–111)
Creatinine, Ser: 0.85 mg/dL (ref 0.61–1.24)
GFR, Estimated: >60 mL/min (ref >60)
Glucose, Bld: 124 mg/dL — ABNORMAL HIGH (ref 70–99)
Potassium: 3.9 mmol/L (ref 3.5–5.1)
Sodium: 136 mmol/L (ref 135–145)

## 2022-03-27 LAB — PROCALCITONIN: Procalcitonin: 0.19 ng/mL

## 2022-03-27 MED ORDER — DAPAGLIFLOZIN PROPANEDIOL 10 MG PO TABS
10.0000 mg | ORAL_TABLET | Freq: Every day | ORAL | Status: DC
Start: 2022-03-27 — End: 2022-03-28
  Administered 2022-03-27 – 2022-03-28 (×2): 10 mg via ORAL
  Filled 2022-03-27 (×2): qty 1

## 2022-03-27 MED ORDER — SODIUM CHLORIDE 0.9 % IV SOLN
2.0000 g | Freq: Two times a day (BID) | INTRAVENOUS | Status: DC
  Administered 2022-03-27 (×2): 2 g via INTRAVENOUS
  Filled 2022-03-27 (×2): qty 12.5

## 2022-03-27 MED ORDER — GABAPENTIN 300 MG PO CAPS
300.0000 mg | ORAL_CAPSULE | Freq: Three times a day (TID) | ORAL | Status: DC
  Administered 2022-03-27 – 2022-03-28 (×4): 300 mg via ORAL
  Filled 2022-03-27 (×4): qty 1

## 2022-03-27 MED ORDER — VANCOMYCIN HCL 1500 MG/300ML IV SOLN
1500.0000 mg | INTRAVENOUS | Status: DC
Start: 2022-03-27 — End: 2022-03-28
  Administered 2022-03-27: 1500 mg via INTRAVENOUS
  Filled 2022-03-27: qty 300

## 2022-03-27 MED ORDER — METOPROLOL SUCCINATE ER 50 MG PO TB24
50.0000 mg | ORAL_TABLET | Freq: Every day | ORAL | Status: DC
  Administered 2022-03-27 – 2022-03-28 (×2): 50 mg via ORAL
  Filled 2022-03-27 (×2): qty 1

## 2022-03-27 MED ORDER — PANTOPRAZOLE SODIUM 40 MG PO TBEC
80.0000 mg | DELAYED_RELEASE_TABLET | Freq: Every day | ORAL | Status: DC
  Administered 2022-03-27 – 2022-03-28 (×2): 80 mg via ORAL
  Filled 2022-03-27 (×2): qty 2

## 2022-03-27 MED ORDER — ACETAMINOPHEN 500 MG PO TABS
1000.0000 mg | ORAL_TABLET | Freq: Four times a day (QID) | ORAL | Status: DC | PRN
  Administered 2022-03-27 (×3): 1000 mg via ORAL
  Filled 2022-03-27 (×3): qty 2

## 2022-03-27 MED ORDER — TAMSULOSIN HCL 0.4 MG PO CAPS
0.4000 mg | ORAL_CAPSULE | Freq: Every day | ORAL | Status: DC
  Administered 2022-03-27 – 2022-03-28 (×2): 0.4 mg via ORAL
  Filled 2022-03-27 (×2): qty 1

## 2022-03-27 MED ORDER — LINAGLIPTIN 5 MG PO TABS
5.0000 mg | ORAL_TABLET | Freq: Every day | ORAL | Status: DC
  Administered 2022-03-27 – 2022-03-28 (×2): 5 mg via ORAL
  Filled 2022-03-27 (×2): qty 1

## 2022-03-27 MED ORDER — SODIUM CHLORIDE 0.9 % IV SOLN
2.0000 g | Freq: Three times a day (TID) | INTRAVENOUS | Status: DC
  Administered 2022-03-27 – 2022-03-28 (×3): 2 g via INTRAVENOUS
  Filled 2022-03-27 (×3): qty 12.5

## 2022-03-27 MED ORDER — VENLAFAXINE HCL ER 75 MG PO CP24
75.0000 mg | ORAL_CAPSULE | Freq: Every day | ORAL | Status: DC
  Administered 2022-03-27 – 2022-03-28 (×2): 75 mg via ORAL
  Filled 2022-03-27 (×2): qty 1

## 2022-03-27 MED ORDER — ONDANSETRON HCL 4 MG/2ML IJ SOLN
4.0000 mg | Freq: Four times a day (QID) | INTRAMUSCULAR | Status: DC | PRN
  Administered 2022-03-27: 4 mg via INTRAVENOUS
  Filled 2022-03-27: qty 2

## 2022-03-27 MED ORDER — METOCLOPRAMIDE HCL 5 MG/ML IJ SOLN
5.0000 mg | Freq: Once | INTRAMUSCULAR | Status: AC
  Administered 2022-03-27: 5 mg via INTRAVENOUS
  Filled 2022-03-27: qty 2

## 2022-03-27 MED ORDER — VANCOMYCIN HCL IN DEXTROSE 1-5 GM/200ML-% IV SOLN
1000.0000 mg | Freq: Once | INTRAVENOUS | Status: AC
  Administered 2022-03-27: 1000 mg via INTRAVENOUS
  Filled 2022-03-27: qty 200

## 2022-03-27 MED ORDER — FUROSEMIDE 40 MG PO TABS
20.0000 mg | ORAL_TABLET | Freq: Every day | ORAL | Status: DC
  Administered 2022-03-27 – 2022-03-28 (×2): 20 mg via ORAL
  Filled 2022-03-27 (×2): qty 1

## 2022-03-27 MED ORDER — ROSUVASTATIN CALCIUM 20 MG PO TABS
40.0000 mg | ORAL_TABLET | Freq: Every day | ORAL | Status: DC
  Administered 2022-03-27 – 2022-03-28 (×2): 40 mg via ORAL
  Filled 2022-03-27 (×2): qty 2

## 2022-03-27 MED ORDER — PRIMIDONE 50 MG PO TABS
50.0000 mg | ORAL_TABLET | Freq: Every day | ORAL | Status: DC
  Administered 2022-03-27: 50 mg via ORAL
  Filled 2022-03-27: qty 1

## 2022-03-27 MED ORDER — EZETIMIBE 10 MG PO TABS
10.0000 mg | ORAL_TABLET | Freq: Every day | ORAL | Status: DC
  Administered 2022-03-27 – 2022-03-28 (×2): 10 mg via ORAL
  Filled 2022-03-27 (×2): qty 1

## 2022-03-27 NOTE — ED Notes (Signed)
Called pt placement [facility])  to check bed status. Waiting for med neuro bed. Will not have bed until D/C's on neuro

## 2022-03-27 NOTE — ED Provider Notes (Signed)
8:15 AM Pt here with postoperative infection after spinal surgery.  Nontoxic in appearance, hemodynamically stable.  Neurologically intact.  Awaiting admission bed at [facility].  Receiving vancomycin and cefepime for broad coverage.  Patient has no acute complaints at this time other than mild headache that is not improving with morphine, Tylenol.  Have ordered a one-time dose of Reglan.  We will also attempt to find him a hospital bed and will order him a breakfast for the morning.   Beila Purdie, Delice Bison, DO 03/27/22 [phone]

## 2022-03-27 NOTE — ED Notes (Signed)
Spoke with Joelene Millin at [facility], pt still awaiting a bed at [place]

## 2022-03-27 NOTE — ED Notes (Signed)
Spoke w/Cathy @ pt placement, still no bed assignment.

## 2022-03-27 NOTE — Progress Notes (Signed)
Pharmacy Antibiotic Note  Justin Patel is a 71 y.o. male admitted on 03/26/2022 with sepsis.  Pharmacy has been consulted for Vancomycin dosing.  Plan: Vancomycin 1 gm IV X 1 given in ED on 8/18 @ 1830. Additional Vanc 1 gm on 8/19 @ ~ 0400 to make total loading dose of 2 gm. Vancomycin 1500 mg IV Q24H ordered to start on 8/19 @ 1800.   AUC = 537.3 Vanc trough = 13   Height: '5\' 5"'$  (165.1 cm) Weight: 81.3 kg (179 lb 3.7 oz) IBW/kg (Calculated) : 61.5  Temp (24hrs), Avg:98.7 F (37.1 C), Min:98 F (36.7 C), Max:99.7 F (37.6 C)  Recent Labs  Lab 03/24/22 1245 03/26/22 1051  WBC 10.9* 9.4  9.3  CREATININE 1.20 1.13    Estimated Creatinine Clearance: 58.9 mL/min (by C-G formula based on SCr of 1.13 mg/dL).    Allergies  Allergen Reactions   Altace [Ramipril] Anaphylaxis   Ace Inhibitors Hives   Levaquin [Levofloxacin In D5w] Hives   Lyrica [Pregabalin] Other (See Comments)    Edema   Metformin Diarrhea    High doses cause diarrhea.    Trulicity [Dulaglutide] Nausea And Vomiting    Antimicrobials this admission:   >>    >>   Dose adjustments this admission:   Microbiology results:  BCx:   UCx:    Sputum:    MRSA PCR:   Thank you for allowing pharmacy to be a part of this patient's care.  Shaylie Eklund D 03/27/2022 4:30 AM

## 2022-03-27 NOTE — ED Notes (Signed)
Called pt. Placement [facility]) to check bed status for transfer. Pt is on wait list for med/surg/neuro bed. None available at th.is time

## 2022-03-28 ENCOUNTER — Other Ambulatory Visit: Payer: Self-pay

## 2022-03-28 ENCOUNTER — Encounter (HOSPITAL_COMMUNITY): Payer: Self-pay | Admitting: Family Medicine

## 2022-03-28 ENCOUNTER — Encounter (HOSPITAL_COMMUNITY)
Admission: RE | Disposition: A | Discharge status: Home / Self Care | Payer: Self-pay | Source: Home / Self Care | Attending: Family Medicine

## 2022-03-28 ENCOUNTER — Inpatient Hospital Stay (HOSPITAL_COMMUNITY)
Admission: RE | Admit: 2022-03-28 | Discharge: 2022-04-01 | DRG: 856 | Disposition: A | Discharge status: Home / Self Care | Payer: Medicare PPO | Attending: Internal Medicine | Admitting: Internal Medicine

## 2022-03-28 ENCOUNTER — Inpatient Hospital Stay (HOSPITAL_COMMUNITY): Payer: Medicare PPO | Admitting: Anesthesiology

## 2022-03-28 DIAGNOSIS — I110 Hypertensive heart disease with heart failure: Secondary | ICD-10-CM

## 2022-03-28 DIAGNOSIS — T8141XA Infection following a procedure, superficial incisional surgical site, initial encounter: Principal | ICD-10-CM | POA: Diagnosis present

## 2022-03-28 DIAGNOSIS — K219 Gastro-esophageal reflux disease without esophagitis: Secondary | ICD-10-CM | POA: Diagnosis present

## 2022-03-28 DIAGNOSIS — B9561 Methicillin susceptible Staphylococcus aureus infection as the cause of diseases classified elsewhere: Secondary | ICD-10-CM | POA: Diagnosis present

## 2022-03-28 DIAGNOSIS — N400 Enlarged prostate without lower urinary tract symptoms: Secondary | ICD-10-CM | POA: Diagnosis not present

## 2022-03-28 DIAGNOSIS — Z79891 Long term (current) use of opiate analgesic: Secondary | ICD-10-CM | POA: Diagnosis not present

## 2022-03-28 DIAGNOSIS — E785 Hyperlipidemia, unspecified: Secondary | ICD-10-CM | POA: Diagnosis present

## 2022-03-28 DIAGNOSIS — G061 Intraspinal abscess and granuloma: Secondary | ICD-10-CM | POA: Diagnosis present

## 2022-03-28 DIAGNOSIS — F320 Major depressive disorder, single episode, mild: Secondary | ICD-10-CM | POA: Diagnosis present

## 2022-03-28 DIAGNOSIS — Z9049 Acquired absence of other specified parts of digestive tract: Secondary | ICD-10-CM | POA: Diagnosis not present

## 2022-03-28 DIAGNOSIS — I10 Essential (primary) hypertension: Secondary | ICD-10-CM | POA: Diagnosis not present

## 2022-03-28 DIAGNOSIS — K5909 Other constipation: Secondary | ICD-10-CM | POA: Diagnosis not present

## 2022-03-28 DIAGNOSIS — F39 Unspecified mood [affective] disorder: Secondary | ICD-10-CM | POA: Diagnosis present

## 2022-03-28 DIAGNOSIS — Z981 Arthrodesis status: Secondary | ICD-10-CM | POA: Diagnosis not present

## 2022-03-28 DIAGNOSIS — Z888 Allergy status to other drugs, medicaments and biological substances status: Secondary | ICD-10-CM | POA: Diagnosis not present

## 2022-03-28 DIAGNOSIS — Z9884 Bariatric surgery status: Secondary | ICD-10-CM

## 2022-03-28 DIAGNOSIS — Z85828 Personal history of other malignant neoplasm of skin: Secondary | ICD-10-CM

## 2022-03-28 DIAGNOSIS — T8142XA Infection following a procedure, deep incisional surgical site, initial encounter: Secondary | ICD-10-CM | POA: Diagnosis not present

## 2022-03-28 DIAGNOSIS — Z833 Family history of diabetes mellitus: Secondary | ICD-10-CM

## 2022-03-28 DIAGNOSIS — Z881 Allergy status to other antibiotic agents status: Secondary | ICD-10-CM | POA: Diagnosis not present

## 2022-03-28 DIAGNOSIS — M4620 Osteomyelitis of vertebra, site unspecified: Secondary | ICD-10-CM

## 2022-03-28 DIAGNOSIS — M1990 Unspecified osteoarthritis, unspecified site: Secondary | ICD-10-CM | POA: Diagnosis present

## 2022-03-28 DIAGNOSIS — E119 Type 2 diabetes mellitus without complications: Secondary | ICD-10-CM

## 2022-03-28 DIAGNOSIS — Z8249 Family history of ischemic heart disease and other diseases of the circulatory system: Secondary | ICD-10-CM | POA: Diagnosis not present

## 2022-03-28 DIAGNOSIS — R251 Tremor, unspecified: Secondary | ICD-10-CM | POA: Diagnosis present

## 2022-03-28 DIAGNOSIS — I509 Heart failure, unspecified: Secondary | ICD-10-CM | POA: Diagnosis not present

## 2022-03-28 DIAGNOSIS — Z7985 Long-term (current) use of injectable non-insulin antidiabetic drugs: Secondary | ICD-10-CM

## 2022-03-28 DIAGNOSIS — F419 Anxiety disorder, unspecified: Secondary | ICD-10-CM | POA: Diagnosis not present

## 2022-03-28 DIAGNOSIS — Y838 Other surgical procedures as the cause of abnormal reaction of the patient, or of later complication, without mention of misadventure at the time of the procedure: Secondary | ICD-10-CM | POA: Diagnosis present

## 2022-03-28 DIAGNOSIS — T8149XA Infection following a procedure, other surgical site, initial encounter: Secondary | ICD-10-CM | POA: Diagnosis present

## 2022-03-28 DIAGNOSIS — T402X5A Adverse effect of other opioids, initial encounter: Secondary | ICD-10-CM | POA: Diagnosis not present

## 2022-03-28 DIAGNOSIS — Z96652 Presence of left artificial knee joint: Secondary | ICD-10-CM | POA: Diagnosis present

## 2022-03-28 DIAGNOSIS — Z79899 Other long term (current) drug therapy: Secondary | ICD-10-CM

## 2022-03-28 DIAGNOSIS — M546 Pain in thoracic spine: Secondary | ICD-10-CM | POA: Diagnosis not present

## 2022-03-28 DIAGNOSIS — N138 Other obstructive and reflux uropathy: Secondary | ICD-10-CM | POA: Diagnosis present

## 2022-03-28 DIAGNOSIS — T8189XA Other complications of procedures, not elsewhere classified, initial encounter: Secondary | ICD-10-CM | POA: Diagnosis not present

## 2022-03-28 DIAGNOSIS — T8140XA Infection following a procedure, unspecified, initial encounter: Secondary | ICD-10-CM | POA: Diagnosis not present

## 2022-03-28 HISTORY — PX: THORACIC LAMINECTOMY FOR EPIDURAL ABSCESS: SHX6115

## 2022-03-28 LAB — BASIC METABOLIC PANEL
Anion gap: 4 — ABNORMAL LOW (ref 5–15)
BUN: 11 mg/dL (ref 8–23)
CO2: 24 mmol/L (ref 22–32)
Calcium: 8.3 mg/dL — ABNORMAL LOW (ref 8.9–10.3)
Chloride: 109 mmol/L (ref 98–111)
Creatinine, Ser: 0.79 mg/dL (ref 0.61–1.24)
GFR, Estimated: >60 mL/min (ref >60)
Glucose, Bld: 110 mg/dL — ABNORMAL HIGH (ref 70–99)
Potassium: 3.9 mmol/L (ref 3.5–5.1)
Sodium: 137 mmol/L (ref 135–145)

## 2022-03-28 LAB — PROCALCITONIN: Procalcitonin: 0.11 ng/mL

## 2022-03-28 LAB — GLUCOSE, CAPILLARY: Glucose-Capillary: 134 mg/dL — ABNORMAL HIGH (ref 70–99)

## 2022-03-28 LAB — C-REACTIVE PROTEIN: CRP: 3.1 mg/dL — ABNORMAL HIGH (ref <1.0)

## 2022-03-28 SURGERY — THORACIC LAMINECTOMY FOR EPIDURAL ABSCESS
Anesthesia: General | Site: Spine Thoracic

## 2022-03-28 MED ORDER — OXYCODONE HCL 5 MG PO TABS
ORAL_TABLET | ORAL | Status: AC
  Filled 2022-03-28: qty 1

## 2022-03-28 MED ORDER — ONDANSETRON HCL 4 MG PO TABS
4.0000 mg | ORAL_TABLET | Freq: Four times a day (QID) | ORAL | Status: DC | PRN
Start: 2022-03-28 — End: 2022-04-01

## 2022-03-28 MED ORDER — MORPHINE SULFATE (PF) 4 MG/ML IV SOLN
4.0000 mg | INTRAVENOUS | Status: DC | PRN
  Administered 2022-03-28: 4 mg via INTRAVENOUS
  Filled 2022-03-28 (×2): qty 1

## 2022-03-28 MED ORDER — MIDAZOLAM HCL 2 MG/2ML IJ SOLN
INTRAMUSCULAR | Status: AC
  Filled 2022-03-28: qty 2

## 2022-03-28 MED ORDER — ACETAMINOPHEN 325 MG PO TABS: 650.0000 mg | ORAL_TABLET | Freq: Four times a day (QID) | ORAL | Status: DC | PRN

## 2022-03-28 MED ORDER — METOPROLOL SUCCINATE ER 25 MG PO TB24
50.0000 mg | ORAL_TABLET | Freq: Every day | ORAL | Status: DC
  Administered 2022-03-29 – 2022-04-01 (×4): 50 mg via ORAL
  Filled 2022-03-28 (×4): qty 2

## 2022-03-28 MED ORDER — FUROSEMIDE 20 MG PO TABS
20.0000 mg | ORAL_TABLET | Freq: Every day | ORAL | Status: DC
  Administered 2022-03-29 – 2022-04-01 (×4): 20 mg via ORAL
  Filled 2022-03-28 (×4): qty 1

## 2022-03-28 MED ORDER — ONDANSETRON HCL 4 MG/2ML IJ SOLN
4.0000 mg | Freq: Four times a day (QID) | INTRAMUSCULAR | Status: DC | PRN
  Administered 2022-03-31 – 2022-04-01 (×2): 4 mg via INTRAVENOUS
  Filled 2022-03-28 (×2): qty 2

## 2022-03-28 MED ORDER — SUGAMMADEX SODIUM 200 MG/2ML IV SOLN
INTRAVENOUS | Status: DC | PRN
  Administered 2022-03-28: 200 mg via INTRAVENOUS

## 2022-03-28 MED ORDER — PROPOFOL 10 MG/ML IV BOLUS
INTRAVENOUS | Status: DC | PRN
  Administered 2022-03-28: 100 mg via INTRAVENOUS

## 2022-03-28 MED ORDER — MORPHINE SULFATE (PF) 4 MG/ML IV SOLN: 4.0000 mg | INTRAVENOUS | Status: DC | PRN

## 2022-03-28 MED ORDER — SENNA 8.6 MG PO TABS
1.0000 | ORAL_TABLET | Freq: Two times a day (BID) | ORAL | Status: DC
  Administered 2022-03-28 – 2022-04-01 (×8): 8.6 mg via ORAL
  Filled 2022-03-28 (×8): qty 1

## 2022-03-28 MED ORDER — 0.9 % SODIUM CHLORIDE (POUR BTL) OPTIME
TOPICAL | Status: DC | PRN
  Administered 2022-03-28: 1000 mL

## 2022-03-28 MED ORDER — OXYCODONE-ACETAMINOPHEN 5-325 MG PO TABS
1.0000 | ORAL_TABLET | ORAL | Status: DC | PRN
  Administered 2022-03-28 – 2022-03-29 (×2): 1 via ORAL
  Administered 2022-03-29 (×2): 2 via ORAL
  Filled 2022-03-28 (×3): qty 2

## 2022-03-28 MED ORDER — MORPHINE SULFATE (PF) 2 MG/ML IV SOLN
2.0000 mg | INTRAVENOUS | Status: DC | PRN
  Administered 2022-03-28 – 2022-03-29 (×7): 2 mg via INTRAVENOUS
  Filled 2022-03-28 (×7): qty 1

## 2022-03-28 MED ORDER — PIPERACILLIN-TAZOBACTAM 3.375 G IVPB
3.3750 g | Freq: Three times a day (TID) | INTRAVENOUS | Status: DC
  Administered 2022-03-29 – 2022-03-30 (×6): 3.375 g via INTRAVENOUS
  Filled 2022-03-28 (×7): qty 50

## 2022-03-28 MED ORDER — SUCRALFATE 1 G PO TABS
1.0000 g | ORAL_TABLET | Freq: Four times a day (QID) | ORAL | Status: DC
  Administered 2022-03-28 – 2022-04-01 (×15): 1 g via ORAL
  Filled 2022-03-28 (×15): qty 1

## 2022-03-28 MED ORDER — OXYCODONE-ACETAMINOPHEN 5-325 MG PO TABS
ORAL_TABLET | ORAL | Status: AC
  Filled 2022-03-28: qty 1

## 2022-03-28 MED ORDER — POLYETHYLENE GLYCOL 3350 17 G PO PACK: 17.0000 g | PACK | Freq: Every day | ORAL | Status: DC | PRN

## 2022-03-28 MED ORDER — ALBUTEROL SULFATE (2.5 MG/3ML) 0.083% IN NEBU: 2.5000 mg | INHALATION_SOLUTION | RESPIRATORY_TRACT | Status: DC | PRN

## 2022-03-28 MED ORDER — FENTANYL CITRATE (PF) 100 MCG/2ML IJ SOLN
INTRAMUSCULAR | Status: AC
  Filled 2022-03-28: qty 2

## 2022-03-28 MED ORDER — THROMBIN 5000 UNITS EX SOLR
CUTANEOUS | Status: AC
  Filled 2022-03-28: qty 5000

## 2022-03-28 MED ORDER — EPHEDRINE SULFATE-NACL 50-0.9 MG/10ML-% IV SOSY
PREFILLED_SYRINGE | INTRAVENOUS | Status: DC | PRN
  Administered 2022-03-28: 10 mg via INTRAVENOUS

## 2022-03-28 MED ORDER — ROSUVASTATIN CALCIUM 20 MG PO TABS
40.0000 mg | ORAL_TABLET | Freq: Every day | ORAL | Status: DC
  Administered 2022-03-29 – 2022-04-01 (×4): 40 mg via ORAL
  Filled 2022-03-28 (×4): qty 2

## 2022-03-28 MED ORDER — ACETAMINOPHEN 650 MG RE SUPP: 650.0000 mg | Freq: Four times a day (QID) | RECTAL | Status: DC | PRN

## 2022-03-28 MED ORDER — SODIUM CHLORIDE 0.9 % IV SOLN
250.0000 mL | INTRAVENOUS | Status: DC
  Administered 2022-03-29: 250 mL via INTRAVENOUS

## 2022-03-28 MED ORDER — HYDROMORPHONE HCL 1 MG/ML IJ SOLN
1.0000 mg | Freq: Once | INTRAMUSCULAR | Status: AC
  Administered 2022-03-28: 1 mg via INTRAVENOUS
  Filled 2022-03-28: qty 1

## 2022-03-28 MED ORDER — ACETAMINOPHEN 10 MG/ML IV SOLN
1000.0000 mg | Freq: Once | INTRAVENOUS | Status: DC | PRN
  Administered 2022-03-28: 1000 mg via INTRAVENOUS

## 2022-03-28 MED ORDER — PHENOL 1.4 % MT LIQD: 1.0000 | OROMUCOSAL | Status: DC | PRN

## 2022-03-28 MED ORDER — EZETIMIBE 10 MG PO TABS
10.0000 mg | ORAL_TABLET | Freq: Every day | ORAL | Status: DC
  Administered 2022-03-29 – 2022-04-01 (×4): 10 mg via ORAL
  Filled 2022-03-28 (×4): qty 1

## 2022-03-28 MED ORDER — ACETAMINOPHEN 10 MG/ML IV SOLN
INTRAVENOUS | Status: AC
  Filled 2022-03-28: qty 100

## 2022-03-28 MED ORDER — MENTHOL 3 MG MT LOZG
1.0000 | LOZENGE | OROMUCOSAL | Status: DC | PRN
Start: 2022-03-28 — End: 2022-04-01

## 2022-03-28 MED ORDER — INSULIN ASPART 100 UNIT/ML IJ SOLN
0.0000 [IU] | Freq: Three times a day (TID) | INTRAMUSCULAR | Status: DC
  Administered 2022-03-29 – 2022-03-30 (×2): 2 [IU] via SUBCUTANEOUS
  Administered 2022-03-30: 3 [IU] via SUBCUTANEOUS
  Administered 2022-03-31: 2 [IU] via SUBCUTANEOUS
  Administered 2022-04-01: 3 [IU] via SUBCUTANEOUS
  Administered 2022-04-01: 2 [IU] via SUBCUTANEOUS

## 2022-03-28 MED ORDER — VANCOMYCIN HCL 2000 MG/400ML IV SOLN
2000.0000 mg | INTRAVENOUS | Status: DC
  Filled 2022-03-28: qty 400

## 2022-03-28 MED ORDER — PHENYLEPHRINE 80 MCG/ML (10ML) SYRINGE FOR IV PUSH (FOR BLOOD PRESSURE SUPPORT)
PREFILLED_SYRINGE | INTRAVENOUS | Status: DC | PRN
  Administered 2022-03-28: 160 ug via INTRAVENOUS

## 2022-03-28 MED ORDER — THROMBIN 5000 UNITS EX SOLR: OROMUCOSAL | Status: DC | PRN

## 2022-03-28 MED ORDER — FENTANYL CITRATE (PF) 100 MCG/2ML IJ SOLN
25.0000 ug | INTRAMUSCULAR | Status: DC | PRN
  Administered 2022-03-28 (×2): 50 ug via INTRAVENOUS

## 2022-03-28 MED ORDER — PROPOFOL 10 MG/ML IV BOLUS
INTRAVENOUS | Status: AC
  Filled 2022-03-28: qty 20

## 2022-03-28 MED ORDER — HYDROMORPHONE HCL 1 MG/ML IJ SOLN
INTRAMUSCULAR | Status: AC
  Filled 2022-03-28: qty 1

## 2022-03-28 MED ORDER — PRIMIDONE 50 MG PO TABS
50.0000 mg | ORAL_TABLET | Freq: Every day | ORAL | Status: DC
  Administered 2022-03-29 – 2022-03-31 (×3): 50 mg via ORAL
  Filled 2022-03-28 (×5): qty 1

## 2022-03-28 MED ORDER — LIDOCAINE HCL (CARDIAC) PF 100 MG/5ML IV SOSY
PREFILLED_SYRINGE | INTRAVENOUS | Status: DC | PRN
  Administered 2022-03-28: 60 mg via INTRATRACHEAL

## 2022-03-28 MED ORDER — ARTIFICIAL TEARS OPHTHALMIC OINT
TOPICAL_OINTMENT | OPHTHALMIC | Status: AC
  Filled 2022-03-28: qty 3.5

## 2022-03-28 MED ORDER — VANCOMYCIN HCL IN DEXTROSE 1-5 GM/200ML-% IV SOLN
1000.0000 mg | Freq: Two times a day (BID) | INTRAVENOUS | Status: DC
Start: 2022-03-28 — End: 2022-03-29
  Administered 2022-03-28 – 2022-03-29 (×2): 1000 mg via INTRAVENOUS
  Filled 2022-03-28 (×2): qty 200

## 2022-03-28 MED ORDER — THROMBIN 20000 UNITS EX SOLR
CUTANEOUS | Status: AC
  Filled 2022-03-28: qty 20000

## 2022-03-28 MED ORDER — DOCUSATE SODIUM 100 MG PO CAPS
100.0000 mg | ORAL_CAPSULE | Freq: Two times a day (BID) | ORAL | Status: DC
  Administered 2022-03-28 – 2022-04-01 (×8): 100 mg via ORAL
  Filled 2022-03-28 (×8): qty 1

## 2022-03-28 MED ORDER — VANCOMYCIN HCL 1000 MG IV SOLR
INTRAVENOUS | Status: DC | PRN
  Administered 2022-03-28: 1000 mg via TOPICAL

## 2022-03-28 MED ORDER — GABAPENTIN 300 MG PO CAPS
300.0000 mg | ORAL_CAPSULE | Freq: Three times a day (TID) | ORAL | Status: DC
  Administered 2022-03-28 – 2022-04-01 (×11): 300 mg via ORAL
  Filled 2022-03-28 (×11): qty 1

## 2022-03-28 MED ORDER — VANCOMYCIN HCL 1000 MG IV SOLR
INTRAVENOUS | Status: AC
Start: 2022-03-28 — End: ?
  Filled 2022-03-28: qty 20

## 2022-03-28 MED ORDER — BUPIVACAINE HCL (PF) 0.25 % IJ SOLN
INTRAMUSCULAR | Status: AC
  Filled 2022-03-28: qty 30

## 2022-03-28 MED ORDER — ACETAMINOPHEN 325 MG PO TABS: 650.0000 mg | ORAL_TABLET | ORAL | Status: DC | PRN

## 2022-03-28 MED ORDER — VENLAFAXINE HCL ER 75 MG PO CP24
75.0000 mg | ORAL_CAPSULE | Freq: Every day | ORAL | Status: DC
  Administered 2022-03-29 – 2022-04-01 (×4): 75 mg via ORAL
  Filled 2022-03-28 (×4): qty 1

## 2022-03-28 MED ORDER — PHENYLEPHRINE HCL-NACL 20-0.9 MG/250ML-% IV SOLN
INTRAVENOUS | Status: DC | PRN
  Administered 2022-03-28: 80 ug/min via INTRAVENOUS

## 2022-03-28 MED ORDER — ONDANSETRON HCL 4 MG/2ML IJ SOLN
INTRAMUSCULAR | Status: AC
Start: 2022-03-28 — End: ?
  Filled 2022-03-28: qty 2

## 2022-03-28 MED ORDER — ONDANSETRON HCL 4 MG PO TABS: 4.0000 mg | ORAL_TABLET | Freq: Four times a day (QID) | ORAL | Status: DC | PRN

## 2022-03-28 MED ORDER — SUCCINYLCHOLINE CHLORIDE 200 MG/10ML IV SOSY
PREFILLED_SYRINGE | INTRAVENOUS | Status: DC | PRN
  Administered 2022-03-28: 90 mg via INTRAVENOUS

## 2022-03-28 MED ORDER — POTASSIUM CHLORIDE IN NACL 20-0.9 MEQ/L-% IV SOLN
INTRAVENOUS | Status: DC
  Filled 2022-03-28: qty 1000

## 2022-03-28 MED ORDER — FENTANYL CITRATE (PF) 250 MCG/5ML IJ SOLN
INTRAMUSCULAR | Status: DC | PRN
  Administered 2022-03-28: 100 ug via INTRAVENOUS
  Administered 2022-03-28: 150 ug via INTRAVENOUS

## 2022-03-28 MED ORDER — LIDOCAINE 2% (20 MG/ML) 5 ML SYRINGE
INTRAMUSCULAR | Status: AC
Start: 2022-03-28 — End: ?
  Filled 2022-03-28: qty 5

## 2022-03-28 MED ORDER — AMISULPRIDE (ANTIEMETIC) 5 MG/2ML IV SOLN
10.0000 mg | Freq: Once | INTRAVENOUS | Status: DC | PRN
Start: 2022-03-28 — End: 2022-03-28

## 2022-03-28 MED ORDER — ONDANSETRON HCL 4 MG/2ML IJ SOLN: 4.0000 mg | Freq: Four times a day (QID) | INTRAMUSCULAR | Status: DC | PRN

## 2022-03-28 MED ORDER — SUCCINYLCHOLINE CHLORIDE 200 MG/10ML IV SOSY
PREFILLED_SYRINGE | INTRAVENOUS | Status: AC
  Filled 2022-03-28: qty 10

## 2022-03-28 MED ORDER — TAMSULOSIN HCL 0.4 MG PO CAPS
0.4000 mg | ORAL_CAPSULE | Freq: Every day | ORAL | Status: DC
  Administered 2022-03-29 – 2022-04-01 (×4): 0.4 mg via ORAL
  Filled 2022-03-28 (×4): qty 1

## 2022-03-28 MED ORDER — ACETAMINOPHEN 650 MG RE SUPP: 650.0000 mg | RECTAL | Status: DC | PRN

## 2022-03-28 MED ORDER — ROCURONIUM BROMIDE 10 MG/ML (PF) SYRINGE
PREFILLED_SYRINGE | INTRAVENOUS | Status: AC
  Filled 2022-03-28: qty 10

## 2022-03-28 MED ORDER — PANTOPRAZOLE SODIUM 40 MG PO TBEC
40.0000 mg | DELAYED_RELEASE_TABLET | Freq: Every day | ORAL | Status: DC
  Administered 2022-03-29 – 2022-04-01 (×4): 40 mg via ORAL
  Filled 2022-03-28 (×4): qty 1

## 2022-03-28 MED ORDER — SODIUM CHLORIDE 0.9% FLUSH
3.0000 mL | Freq: Two times a day (BID) | INTRAVENOUS | Status: DC
  Administered 2022-03-28 – 2022-04-01 (×7): 3 mL via INTRAVENOUS

## 2022-03-28 MED ORDER — PHENYLEPHRINE 80 MCG/ML (10ML) SYRINGE FOR IV PUSH (FOR BLOOD PRESSURE SUPPORT)
PREFILLED_SYRINGE | INTRAVENOUS | Status: AC
  Filled 2022-03-28: qty 10

## 2022-03-28 MED ORDER — HYDROMORPHONE HCL 1 MG/ML IJ SOLN
0.2500 mg | INTRAMUSCULAR | Status: DC | PRN
  Administered 2022-03-28 (×3): 0.25 mg via INTRAVENOUS

## 2022-03-28 MED ORDER — THROMBIN 20000 UNITS EX SOLR: CUTANEOUS | Status: DC | PRN

## 2022-03-28 MED ORDER — ONDANSETRON HCL 4 MG/2ML IJ SOLN: 4.0000 mg | Freq: Once | INTRAMUSCULAR | Status: DC | PRN

## 2022-03-28 MED ORDER — FENTANYL CITRATE (PF) 250 MCG/5ML IJ SOLN
INTRAMUSCULAR | Status: AC
  Filled 2022-03-28: qty 5

## 2022-03-28 MED ORDER — MIDAZOLAM HCL 2 MG/2ML IJ SOLN
INTRAMUSCULAR | Status: DC | PRN
  Administered 2022-03-28: 2 mg via INTRAVENOUS

## 2022-03-28 MED ORDER — SODIUM CHLORIDE 0.9% FLUSH
3.0000 mL | INTRAVENOUS | Status: DC | PRN
Start: 2022-03-28 — End: 2022-04-01

## 2022-03-28 MED ORDER — EPHEDRINE 5 MG/ML INJ
INTRAVENOUS | Status: AC
Start: 2022-03-28 — End: ?
  Filled 2022-03-28: qty 5

## 2022-03-28 MED ORDER — LACTATED RINGERS IV SOLN: INTRAVENOUS | Status: DC | PRN

## 2022-03-28 MED ORDER — ONDANSETRON HCL 4 MG/2ML IJ SOLN
INTRAMUSCULAR | Status: DC | PRN
  Administered 2022-03-28: 4 mg via INTRAVENOUS

## 2022-03-28 SURGICAL SUPPLY — 43 items
BAG COUNTER SPONGE SURGICOUNT (BAG) ×1 IMPLANT
BAND RUBBER #18 3X1/16 STRL (MISCELLANEOUS) ×2 IMPLANT
BENZOIN TINCTURE PRP APPL 2/3 (GAUZE/BANDAGES/DRESSINGS) ×1 IMPLANT
BUR CARBIDE MATCH 3.0 (BURR) ×1 IMPLANT
CANISTER SUCT 3000ML PPV (MISCELLANEOUS) ×1 IMPLANT
DERMABOND ADVANCED (GAUZE/BANDAGES/DRESSINGS) ×1
DERMABOND ADVANCED .7 DNX12 (GAUZE/BANDAGES/DRESSINGS) IMPLANT
DRAPE LAPAROTOMY 100X72X124 (DRAPES) ×1 IMPLANT
DRAPE MICROSCOPE LEICA (MISCELLANEOUS) ×1 IMPLANT
DRAPE SURG 17X23 STRL (DRAPES) ×1 IMPLANT
DRSG OPSITE POSTOP 4X6 (GAUZE/BANDAGES/DRESSINGS) IMPLANT
DURAPREP 26ML APPLICATOR (WOUND CARE) ×1 IMPLANT
ELECT REM PT RETURN 9FT ADLT (ELECTROSURGICAL) ×1
ELECTRODE REM PT RTRN 9FT ADLT (ELECTROSURGICAL) ×1 IMPLANT
GAUZE 4X4 16PLY ~~LOC~~+RFID DBL (SPONGE) IMPLANT
GLOVE BIO SURGEON STRL SZ7 (GLOVE) IMPLANT
GLOVE BIO SURGEON STRL SZ8 (GLOVE) ×1 IMPLANT
GLOVE BIOGEL PI IND STRL 7.0 (GLOVE) IMPLANT
GLOVE BIOGEL PI INDICATOR 7.0 (GLOVE)
GOWN STRL REUS W/ TWL LRG LVL3 (GOWN DISPOSABLE) IMPLANT
GOWN STRL REUS W/ TWL XL LVL3 (GOWN DISPOSABLE) ×1 IMPLANT
GOWN STRL REUS W/TWL 2XL LVL3 (GOWN DISPOSABLE) IMPLANT
GOWN STRL REUS W/TWL LRG LVL3 (GOWN DISPOSABLE)
GOWN STRL REUS W/TWL XL LVL3 (GOWN DISPOSABLE) ×1
HEMOSTAT POWDER KIT SURGIFOAM (HEMOSTASIS) IMPLANT
KIT BASIN OR (CUSTOM PROCEDURE TRAY) ×1 IMPLANT
KIT TURNOVER KIT B (KITS) ×1 IMPLANT
NDL HYPO 25X1 1.5 SAFETY (NEEDLE) ×1 IMPLANT
NDL SPNL 20GX3.5 QUINCKE YW (NEEDLE) IMPLANT
NEEDLE HYPO 25X1 1.5 SAFETY (NEEDLE) ×1 IMPLANT
NEEDLE SPNL 20GX3.5 QUINCKE YW (NEEDLE) IMPLANT
NS IRRIG 1000ML POUR BTL (IV SOLUTION) ×1 IMPLANT
PACK LAMINECTOMY NEURO (CUSTOM PROCEDURE TRAY) ×1 IMPLANT
PAD ARMBOARD 7.5X6 YLW CONV (MISCELLANEOUS) ×3 IMPLANT
SPONGE SURGIFOAM ABS GEL SZ50 (HEMOSTASIS) ×1 IMPLANT
STRIP CLOSURE SKIN 1/2X4 (GAUZE/BANDAGES/DRESSINGS) ×1 IMPLANT
SUT VIC AB 0 CT1 18XCR BRD8 (SUTURE) ×1 IMPLANT
SUT VIC AB 0 CT1 8-18 (SUTURE) ×1
SUT VIC AB 2-0 CP2 18 (SUTURE) ×1 IMPLANT
SUT VIC AB 3-0 SH 8-18 (SUTURE) ×1 IMPLANT
TOWEL GREEN STERILE (TOWEL DISPOSABLE) ×1 IMPLANT
TOWEL GREEN STERILE FF (TOWEL DISPOSABLE) ×1 IMPLANT
WATER STERILE IRR 1000ML POUR (IV SOLUTION) ×1 IMPLANT

## 2022-03-28 NOTE — H&P (Signed)
History and Physical    Patient: Justin Patel CMK:[id number] DOB: Jun 05, 1951 DOA: 03/28/2022 DOS: the patient was seen and examined on 03/28/2022 PCP: [name], MD  Patient coming from:  [facility]  Chief Complaint: No chief complaint on file.  HPI: Justin Patel is a 71 y.o. male with medical history significant of hypertension, anxiety, depression, multiple back surgeries (most recently removal of bilateral T10 pedicle screws), diabetes, GERD, s/p gastric bypass surgery. Patient presented initially secondary to purulent fluid and blood on his pillow and bed sheets, realizing that there was drainage from his surgical incisions. Patient reports worsened back pain that started prior to him noticing wound drainage. Patient did not do anything to manage the draining fluid other than traveling to the nearest emergency department. No associated chest pain, dyspnea, nausea, vomiting, diarrhea.   Review of Systems: As mentioned in the history of present illness. All other systems reviewed and are negative.  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  removal scheduled to clear margins.   CHF (congestive heart failure) (HCC)    no longer after weight loss   Chronic kidney disease    cysts   Degenerative disc disease    Depression    Diabetes mellitus    no longer diabetic-or on meds   DJD (degenerative joint disease)    Dysplastic nevus 02/04/2016   R upper back paraspinal - severe   Family history of adverse reaction to anesthesia    sons wake up combative   Foot fracture, left 01/2022   Gastric ulcer    GERD (gastroesophageal reflux disease)    H/O hiatal hernia    Headache(784.0)    sinus   Heart murmur    Hx of congestive heart failure    Hyperlipidemia    Hypertension    Iron deficiency  anemia 02/28/2015   Lumbar scoliosis    Opiate abuse, continuous ([place]) 06/20/2015   Sacral fracture, closed ([place])    Seizures ([place])    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, after GI bleed   Sleep apnea    hx not now since wt loss   Stones in the urinary tract    Syncope and collapse    Past Surgical History:  Procedure Laterality Date   ANTERIOR CERVICAL DECOMP/DISCECTOMY FUSION  01/26/2012   Procedure: ANTERIOR CERVICAL DECOMPRESSION/DISCECTOMY FUSION 3 LEVELS;  Surgeon: Floyce Stakes, MD;  [facility];  Service: Neurosurgery;  Laterality: N/A;  Cervical three-four Cervical four-five Cervical five-six Cervical six-seven , Anterior cervical decompression/diskectomy, fusion, plate   ANTERIOR LAT LUMBAR FUSION Right 04/25/2018   Procedure: Right Lumbar two-three Lumbar three-four Lumbar four-five anterior  lateral interbody fusion  with posterior percutaneous pedicle screws;  Surgeon: Erline Levine, MD;  Location: [place];  Service: Neurosurgery;  Laterality: Right;   [place], normal   CARDIAC CATHETERIZATION     [place]     COLONOSCOPY WITH PROPOFOL N/A 12/27/2017   Procedure: COLONOSCOPY WITH PROPOFOL;  Surgeon: Toledo, Benay Pike, MD;  Location: [facility] END[facility];  Service: Gastroenterology;  Laterality: N/A;   ESOPHAGOGASTRODUODENOSCOPY (EGD) WITH PROPOFOL N/A 12/27/2017   Procedure: ESOPHAGOGASTRODUODENOSCOPY (EGD) WITH PROPOFOL;  Surgeon: Alice Reichert, Benay Pike, MD;  Location: [facility]  ENDOSCOPY;  Service: Gastroenterology;  Laterality: N/A;   ESOPHAGOGASTRODUODENOSCOPY (EGD) WITH PROPOFOL N/A 06/19/2021   Procedure: ESOPHAGOGASTRODUODENOSCOPY (EGD) WITH PROPOFOL;  Surgeon: [name], MD;  Location: [facility] END[facility]COPY;  Service: Gastroenterology;  Laterality: N/A;   ESOPHAGOGASTRODUODENOSCOPY (EGD) WITH PROPOFOL N/A 09/25/2021   Procedure: ESOPHAGOGASTRODUODENOSCOPY (EGD) WITH PROPOFOL;  Surgeon:  [name], MD;  Location: [facility] ENDOSCOPY;  Service: Endoscopy;  Laterality: N/A;   GASTRIC BYPASS  08/09/2008   [employer] [facility]   HARDWARE REMOVAL N/A 03/08/2022   Procedure: Removal of bilateral Thoracic ten pedicle screws;  Surgeon: Earnie Larsson, MD;  Location: [place];  Service: Neurosurgery;  Laterality: N/A;   HERNIA REPAIR  08/09/1998   hiatal   JOINT REPLACEMENT     KNEE ARTHROSCOPY     bilateral, left x 2   LAMINECTOMY WITH POSTERIOR LATERAL ARTHRODESIS LEVEL 4 N/A 01/14/2020   Procedure: Decompressive Laminectomy Lumbar One with pedicle screw fixation from Thoracic Ten to Lumbar Two;  Surgeon: Erline Levine, MD;  Location: [place];  Service: Neurosurgery;  Laterality: N/A;  Decompressive Laminectomy Lumbar One with pedicle screw fixation from Thoracic Ten to Lumbar Two   LUMBAR PERCUTANEOUS PEDICLE SCREW 3 LEVEL N/A 04/25/2018   Procedure: LUMBAR PERCUTANEOUS PEDICLE SCREW 3 LEVEL;  Surgeon: Erline Levine, MD;  Location: [place];  Service: Neurosurgery;  Laterality: N/A;   NASAL SINUS SURGERY     x5   OSTEOTOMY  08/10/1999   left   ROUX-EN-Y GASTRIC BYPASS     TONSILLECTOMY     TOTAL KNEE ARTHROPLASTY Left 05/09/2014   Dr. Marry Guan   UVULOPALATOPLASTY  08/09/2009   Social History:  reports that he has never smoked. He has never used smokeless tobacco. He reports current alcohol use. He reports that he does not use drugs.  Allergies  Allergen Reactions   Altace [Ramipril] Anaphylaxis   Ace Inhibitors Hives   Levaquin [Levofloxacin In D5w] Hives   Lyrica [Pregabalin] Other (See Comments)    Edema   Metformin Diarrhea    High doses cause diarrhea.    Trulicity [Dulaglutide] Nausea And Vomiting    Family History  Problem Relation Age of Onset   Dementia Mother 62   Osteoporosis Mother    Heart disease Father 54   Diabetes Father    Lymphoma Sister        lymphoma, stage 4   Ovarian cancer Sister 3       Ovarian   Lupus Sister    Prostate cancer Maternal Uncle     Skin cancer Maternal Uncle    Tongue cancer Maternal Uncle        uncle died of heart attack   Alcoholism Maternal Uncle    Lung cancer Paternal Aunt        pat aunts x 4 died of lung cancer   Lung cancer Paternal Aunt    Lung cancer Paternal Aunt    Lung cancer Paternal Aunt    Mesothelioma Cousin        paternal cousin    Prior to Admission medications   Medication Sig Start Date End Date Taking? Authorizing Provider  acetaminophen (TYLENOL) 325 MG tablet Take 650 mg by mouth every 6 (six) hours as needed for moderate pain.    [provider]  blood glucose meter kit and supplies KIT Dispense based on patient and insurance preference. Check once daily. ICD doing E11.9. 06/30/16   [name], MD  Calcium Carbonate-Vitamin D (CALCIUM 600/VITAMIN D PO) Take 1 tablet by  mouth daily.    [provider]  cyclobenzaprine (FLEXERIL) 5 MG tablet TAKE 1 TABLET BY MOUTH EVERY 8 HOURS AS NEEDED FOR SPASMS. 01/13/22   [name], MD  dapagliflozin propanediol (FARXIGA) 10 MG TABS tablet Take 1 tablet (10 mg total) by mouth daily before breakfast. 01/05/22   [name], MD  esomeprazole (NEXIUM) 40 MG capsule TAKE 1 CAPSULE BY MOUTH ONCE DAILY 06/20/20   [name], MD  ezetimibe (ZETIA) 10 MG tablet TAKE ONE TABLET BY MOUTH EVERY DAY 10/05/21   [name], MD  furosemide (LASIX) 20 MG tablet TAKE ONE TABLET BY MOUTH EVERY DAY 06/19/21   [name], MD  gabapentin (NEURONTIN) 300 MG capsule Take 1 capsule (300 mg total) by mouth 3 (three) times daily. 01/13/22   [name], MD  metoprolol succinate (TOPROL-XL) 50 MG 24 hr tablet TAKE 1 TABLET BY MOUTH DAILY WITH OR IMMEDIATLY FOLLOWING A MEAL 12/07/21   [name], MD  metroNIDAZOLE (METROGEL) 0.75 % gel Apply 1 application topically 2 (two) times daily. For rosacea - apply to the cheeks, chin, and nose BID. Patient taking differently: Apply 1 application  topically 2 (two)  times daily as needed (rosacea). apply to the cheeks, chin, and nose 06/17/21 06/17/22  Ralene Bathe, MD  Multiple Vitamin (MULTIVITAMIN) capsule Take 1 capsule by mouth daily. With copper and zinc 07/11/18   [name], MD  nystatin-triamcinolone ointment [facility]) Apply 1 application topically 2 (two) times daily. Patient taking differently: Apply 1 application  topically daily as needed (irritation). 05/08/18   [name], MD  primidone (MYSOLINE) 50 MG tablet Take 1 tablet (50 mg total) by mouth at bedtime. 10/16/21   Tomi Likens, Adam R, DO  rosuvastatin (CRESTOR) 40 MG tablet TAKE ONE TABLET EVERY DAY 10/06/21   Wellington Hampshire, MD  sucralfate (CARAFATE) 1 g tablet TAKE 1 TABLET BY MOUTH 4 TIMES DAILY 12/21/21   [name], MD  tamsulosin (FLOMAX) 0.4 MG CAPS capsule TAKE 1 CAPSULE BY MOUTH EVERY DAY 09/01/21   [name], MD  TRADJENTA 5 MG TABS tablet TAKE 1 TABLET BY MOUTH DAILY 12/04/21   [name], MD  traMADol (ULTRAM) 50 MG tablet TAKE 1-2 TABLETS BY MOUTH EVERY 4-6 HOURS AS NEEDED 03/04/22   [name], MD  venlafaxine XR (EFFEXOR-XR) 75 MG 24 hr capsule TAKE ONE CAPSULE EVERY MORNING WITH BREAKFAST 09/09/21   [name], MD    Physical Exam: Vitals not available  General exam: Appears calm and comfortable and in no acute distress. Conversant Respiratory: Mild bilateral wheezing. Respiratory effort normal with no intercostal retractions or use of accessory muscles Cardiovascular: S1 & S2 heard, RRR. No murmurs, rubs, gallops or clicks. No edema Gastrointestinal: Abdomen is distended, soft and non-tender. No masses felt. Normal bowel sounds heard Neurologic: Resting tremor.  Musculoskeletal: No calf tenderness Skin: Two incision wounds with hardened exudative material and no surrounding erythema Psychiatry: Alert and oriented x4. Memory intact. Mood & affect appropriate  Data Reviewed: There are no new results to review at this  time.  Assessment and Plan:  Thoracic spine abscesses MRI thoracic spine significant for two abscesses in setting of recent thoracic spine surgery on 7/31. Patient was started empirically on Vancomycin and Cefepime at [facility]. Blood cultures from [facility] are no growth to date. -Neurosurgery recommendations pending -Follow-up blood culture (8/18) data -Vancomycin/Zosyn IV  Diabetes mellitus, type 2 Patient is on Tradjenta  and [place]ade as an outpatient. Hemoglobin A1C of 6.4% from 03/04/22. -SSI moderate  Primary hypertension -Continue metoprolol  Hyperlipidemia -Continue Crestor  Tremor -Continue primidone  BPH -Continue home Flomax  Depression Anxiety -Continue home Effexor  GERD -Continue Protonix (substituted for home Nexium)  Advance Care Planning:   Code Status: Full Code  Consults: Neurosurgery (nursing informed service that patient has arrived)  Family Communication: None at bedside   Author: Cordelia Poche, MD 03/28/2022 4:30 PM  For on call review [email or web address].

## 2022-03-28 NOTE — ED Notes (Signed)
Report given to [facility] at this time.   

## 2022-03-28 NOTE — Transfer of Care (Signed)
Immediate Anesthesia Transfer of Care Note  Patient: Justin Patel  Procedure(s) Performed: THORACIC WOUND WASHOUT (Spine Thoracic)  Patient Location: PACU  Anesthesia Type:General  Level of Consciousness: awake  Airway & Oxygen Therapy: Patient Spontanous Breathing  Post-op Assessment: Report given to RN and Post -op Vital signs reviewed and stable  Post vital signs: Reviewed and stable  Last Vitals:  Vitals Value Taken Time  BP    Temp    Pulse    Resp    SpO2      Last Pain:  Vitals:   03/28/22 1937  TempSrc: Oral  PainSc:          Complications: No notable events documented.

## 2022-03-28 NOTE — ED Notes (Signed)
Meal tray given at this time. Wife at bedside. No needs verbalized.

## 2022-03-28 NOTE — ED Notes (Signed)
Just ca lled to check on bed for Justin Patel per bed placement still waiting  on a bed

## 2022-03-28 NOTE — ED Provider Notes (Signed)
-----------------------------------------   6:15 AM on 03/28/2022 -----------------------------------------   No events overnight. Patient remains in the ED pending bed availability at [facility]. Remains afebrile and hemodynamically stable.   Paulette Blanch, MD 03/28/22 [phone]

## 2022-03-28 NOTE — Anesthesia Procedure Notes (Signed)
Procedure Name: Intubation Date/Time: 03/28/2022 8:35 PM  Performed by: Clovis Cao, CRNAPre-anesthesia Checklist: Patient identified, Emergency Drugs available, Suction available and Patient being monitored Patient Re-evaluated:Patient Re-evaluated prior to induction Oxygen Delivery Method: Circle system utilized Preoxygenation: Pre-oxygenation with 100% oxygen Induction Type: IV induction and Rapid sequence Laryngoscope Size: Glidescope and 4 Grade View: Grade I Tube type: Oral Tube size: 7.0 mm Number of attempts: 1 Airway Equipment and Method: Stylet and Video-laryngoscopy Placement Confirmation: ETT inserted through vocal cords under direct vision, positive ETCO2 and breath sounds checked- equal and bilateral Secured at: 22 cm Tube secured with: Tape Dental Injury: Teeth and Oropharynx as per pre-operative assessment

## 2022-03-28 NOTE — Anesthesia Postprocedure Evaluation (Signed)
Anesthesia Post Note  Patient: Justin Patel  Procedure(s) Performed: THORACIC WOUND WASHOUT (Spine Thoracic)     Patient location during evaluation: PACU Anesthesia Type: General Level of consciousness: awake Pain management: pain level controlled Vital Signs Assessment: post-procedure vital signs reviewed and stable Respiratory status: spontaneous breathing, nonlabored ventilation, respiratory function stable and patient connected to nasal cannula oxygen Cardiovascular status: blood pressure returned to baseline and stable Postop Assessment: no apparent nausea or vomiting Anesthetic complications: no   No notable events documented.  Last Vitals:  Vitals:   03/28/22 2225 03/28/22 2230  BP:  117/80  Pulse: 70 72  Resp: 12 (!) 21  Temp:  36.4 C  SpO2: 100% 98%    Last Pain:  Vitals:   03/28/22 2230  TempSrc:   PainSc: 4                  Nakai Pollio P Dmarco Baldus

## 2022-03-28 NOTE — Progress Notes (Signed)
Pharmacy Antibiotic Note  Justin Patel is a 71 y.o. male admitted on 03/26/2022 with sepsis.  Pharmacy has been consulted for Vancomycin dosing.  Plan: Renal function has returned to baseline. Will adjust antibiotics order as given below:  1) Vancomycin 2000 mg IV Q24H  Calculated AUC = 521.3 Vanc trough = 10.2 Scr used: 0.8, Vd used: 0.72  2) Cefepime 2g IV Q 8 hours  Height: '5\' 5"'$  (165.1 cm) Weight: 81.3 kg (179 lb 3.7 oz) IBW/kg (Calculated) : 61.5  Temp (24hrs), Avg:98 F (36.7 C), Min:97.8 F (36.6 C), Max:98.2 F (36.8 C)  Recent Labs  Lab 03/24/22 1245 03/26/22 1051 03/27/22 1338 03/28/22 0631  WBC 10.9* 9.4  9.3 8.5  --   CREATININE 1.20 1.13 0.85 0.79     Estimated Creatinine Clearance: 83.1 mL/min (by C-G formula based on SCr of 0.79 mg/dL).    Allergies  Allergen Reactions   Altace [Ramipril] Anaphylaxis   Ace Inhibitors Hives   Levaquin [Levofloxacin In D5w] Hives   Lyrica [Pregabalin] Other (See Comments)    Edema   Metformin Diarrhea    High doses cause diarrhea.    Trulicity [Dulaglutide] Nausea And Vomiting    Antimicrobials this admission:  Vanc >>   Cefepime >>   Dose adjustments this admission: Vanc adjusted from '1500mg'$  Q24 to 2g Q24 Cefepime adjusted from 2g Q12 to 2g Q8  Microbiology results:  BCx: NGTD   Thank you for allowing pharmacy to be a part of this patient's care.  Ammaar Encina A Hayslee Casebolt 03/28/2022 10:34 AM

## 2022-03-28 NOTE — Consult Note (Cosign Needed Addendum)
Reason for Consult:postop wound infection Referring Physician: EDP  Justin Patel is an 71 y.o. male.   HPI:  71 year old male presented to [facility] hospital Friday night with wound drainage. Denies any fevers or chills. Patient had hardware removal about 3 weeks ago by Dr. Annette Stable. Patient states that his pedicle screws at T10 became loose. He has had increase pain in his back since Friday night. He just arrived this afternoon to [facility] [place]. Has been getting IV antibiotics since Friday. 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  removal scheduled to clear margins.   CHF (congestive heart failure) (HCC)    no longer after weight loss   Chronic kidney disease    cysts   Degenerative disc disease    Depression    Diabetes mellitus    no longer diabetic-or on meds   DJD (degenerative joint disease)    Dysplastic nevus 02/04/2016   R upper back paraspinal - severe   Family history of adverse reaction to anesthesia    sons wake up combative   Foot fracture, left 01/2022   Gastric ulcer    GERD (gastroesophageal reflux disease)    H/O hiatal hernia    Headache(784.0)    sinus   Heart murmur    Hx of congestive heart failure    Hyperlipidemia    Hypertension    Iron deficiency anemia 02/28/2015   Lumbar scoliosis    Opiate abuse, continuous ([place]) 06/20/2015   Sacral fracture, closed ([place])    Seizures ([place])    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, after GI bleed   Sleep apnea    hx not now since wt loss   Stones in the urinary tract    Syncope and collapse     Past Surgical History:  Procedure Laterality Date   ANTERIOR CERVICAL DECOMP/DISCECTOMY FUSION  01/26/2012   Procedure: ANTERIOR CERVICAL DECOMPRESSION/DISCECTOMY FUSION 3 LEVELS;  Surgeon: Floyce Stakes, MD;   [facility];  Service: Neurosurgery;  Laterality: N/A;  Cervical three-four Cervical four-five Cervical five-six Cervical six-seven , Anterior cervical decompression/diskectomy, fusion, plate   ANTERIOR LAT LUMBAR FUSION Right 04/25/2018   Procedure: Right Lumbar two-three Lumbar three-four Lumbar four-five anterior  lateral interbody fusion  with posterior percutaneous pedicle screws;  Surgeon: Erline Levine, MD;  Location: [place];  Service: Neurosurgery;  Laterality: Right;   [place], normal   CARDIAC CATHETERIZATION     [place]     COLONOSCOPY WITH PROPOFOL N/A 12/27/2017   Procedure: COLONOSCOPY WITH PROPOFOL;  Surgeon: Toledo, Benay Pike, MD;  Location: [facility];  Service: Gastroenterology;  Laterality: N/A;   ESOPHAGOGASTRODUODENOSCOPY (EGD) WITH PROPOFOL N/A 12/27/2017   Procedure: ESOPHAGOGASTRODUODENOSCOPY (EGD) WITH PROPOFOL;  Surgeon: Toledo, Benay Pike, MD;  Location: [facility];  Service: Gastroenterology;  Laterality: N/A;   ESOPHAGOGASTRODUODENOSCOPY (EGD) WITH PROPOFOL N/A 06/19/2021   Procedure: ESOPHAGOGASTRODUODENOSCOPY (EGD) WITH PROPOFOL;  Surgeon: [name], MD;  Location: [facility];  Service: Gastroenterology;  Laterality: N/A;   ESOPHAGOGASTRODUODENOSCOPY (EGD) WITH PROPOFOL N/A 09/25/2021   Procedure: ESOPHAGOGASTRODUODENOSCOPY (EGD) WITH PROPOFOL;  Surgeon: [name], MD;  Location: [facility];  Service: Endoscopy;  Laterality: N/A;   GASTRIC BYPASS  08/09/2008   [employer]   HARDWARE REMOVAL N/A 03/08/2022   Procedure: Removal of bilateral Thoracic ten pedicle screws;  Surgeon: Earnie Larsson, MD;  Location: [place];  Service: Neurosurgery;  Laterality: N/A;   HERNIA REPAIR  08/09/1998   hiatal   JOINT REPLACEMENT     KNEE ARTHROSCOPY     bilateral, left x 2   LAMINECTOMY WITH POSTERIOR LATERAL ARTHRODESIS LEVEL 4 N/A 01/14/2020   Procedure:  Decompressive Laminectomy Lumbar One with pedicle screw fixation from Thoracic Ten to Lumbar Two;  Surgeon: Erline Levine, MD;  Location: [place];  Service: Neurosurgery;  Laterality: N/A;  Decompressive Laminectomy Lumbar One with pedicle screw fixation from Thoracic Ten to Lumbar Two   LUMBAR PERCUTANEOUS PEDICLE SCREW 3 LEVEL N/A 04/25/2018   Procedure: LUMBAR PERCUTANEOUS PEDICLE SCREW 3 LEVEL;  Surgeon: Erline Levine, MD;  Location: [place];  Service: Neurosurgery;  Laterality: N/A;   NASAL SINUS SURGERY     x5   OSTEOTOMY  08/10/1999   left   ROUX-EN-Y GASTRIC BYPASS     TONSILLECTOMY     TOTAL KNEE ARTHROPLASTY Left 05/09/2014   Dr. Marry Guan   UVULOPALATOPLASTY  08/09/2009    Allergies  Allergen Reactions   Altace [Ramipril] Anaphylaxis   Ace Inhibitors Hives   Levaquin [Levofloxacin In D5w] Hives   Lyrica [Pregabalin] Other (See Comments)    Edema   Metformin Diarrhea    High doses cause diarrhea.    Trulicity [Dulaglutide] Nausea And Vomiting    Social History   Tobacco Use   Smoking status: Never   Smokeless tobacco: Never  Substance Use Topics   Alcohol use: Yes    Comment: occassionally    Family History  Problem Relation Age of Onset   Dementia Mother 13   Osteoporosis Mother    Heart disease Father 20   Diabetes Father    Lymphoma Sister        lymphoma, stage 4   Ovarian cancer Sister 95       Ovarian   Lupus Sister    Prostate cancer Maternal Uncle    Skin cancer Maternal Uncle    Tongue cancer Maternal Uncle        uncle died of heart attack   Alcoholism Maternal Uncle    Lung cancer Paternal Aunt        pat aunts x 4 died of lung cancer   Lung cancer Paternal Aunt    Lung cancer Paternal Aunt    Lung cancer Paternal Aunt    Mesothelioma Cousin        paternal cousin     Review of Systems  Positive ROS: as above  All other systems have been reviewed and were otherwise negative with the exception of those mentioned in the HPI and as  above.  Objective: Vital signs in last 24 hours: Temp:  [97.7 F (36.5 C)-98.2 F (36.8 C)] 97.8 F (36.6 C) (08/20 1620) Pulse Rate:  [55-79] 70 (08/20 1620) Resp:  [14-20] 16 (08/20 1620) BP: (111-173)/(61-94) 111/85 (08/20 1620) SpO2:  [91 %-100 %] 93 % (08/20 1620)  General Appearance: Alert, cooperative, no distress, appears stated age Head: Normocephalic, without obvious abnormality, atraumatic Eyes: PERRL, conjunctiva/corneas clear, EOM's intact, fundi benign, both eyes      Lungs:  respirations unlabored Heart: Regular rate and rhythm Pulses: 2+ and symmetric all extremities Skin: Skin color, texture, turgor normal, no rashes or lesions, thoracic incision wound drainage  NEUROLOGIC:   Mental status: A&O x4, no aphasia, good attention span, Memory and fund of knowledge Motor Exam - grossly normal, normal tone and bulk Sensory Exam - grossly normal Reflexes: symmetric, no pathologic reflexes, No Hoffman's, No clonus Coordination - grossly normal Gait - grossly normal Balance - grossly normal Cranial Nerves: I: smell Not tested  II: visual acuity  OS: na    OD: na  II: visual fields Full to confrontation  II: pupils Equal, round, reactive to light  III,VII: ptosis None  III,IV,VI: extraocular muscles  Full ROM  V: mastication   V: facial light touch sensation    V,VII: corneal reflex    VII: facial muscle function - upper    VII: facial muscle function - lower   VIII: hearing   IX: soft palate elevation    IX,X: gag reflex   XI: trapezius strength    XI: sternocleidomastoid strength   XI: neck flexion strength    XII: tongue strength      Data Review Lab Results  Component Value Date   WBC 8.5 03/27/2022   HGB 11.3 (L) 03/27/2022   HCT 36.3 (L) 03/27/2022   MCV 97.6 03/27/2022   PLT 315 03/27/2022   Lab Results  Component Value Date   NA 137 03/28/2022   K 3.9 03/28/2022   CL 109 03/28/2022   CO2 24 03/28/2022   BUN 11 03/28/2022   CREATININE  0.79 03/28/2022   GLUCOSE 110 (H) 03/28/2022   Lab Results  Component Value Date   INR 0.90 01/23/2018    Radiology: No results found.   Assessment/Plan: 71 year old male presented to [place] with thoracic wound drainage from thoracic hardware removal surgery 3 weeks ago. MRI shows fluid collection at both incisions. He does have some drainage from both incisions therefore we will plan for an I&D of the wound. Risks of the surgery were discussed including bleeding, further infection, lack of relief of symptoms, worsening symptoms, weakness, and anesthesia risks. Patient understood and agreed. Plan discussed and confirmed with Dr. Pecola Leisure Marykathleen Russi 03/28/2022 6:43 PM

## 2022-03-28 NOTE — Anesthesia Preprocedure Evaluation (Addendum)
Anesthesia Evaluation  Patient identified by MRN, date of birth, ID band Patient awake    Reviewed: Allergy & Precautions, NPO status , Patient's Chart, lab work & pertinent test results  Airway Mallampati: III  TM Distance: >3 FB Neck ROM: Limited    Dental no notable dental hx.    Pulmonary sleep apnea ,    Pulmonary exam normal        Cardiovascular hypertension, Pt. on home beta blockers +CHF  Normal cardiovascular exam     Neuro/Psych  Headaches, Seizures -,  PSYCHIATRIC DISORDERS Anxiety Depression    GI/Hepatic hiatal hernia, PUD, GERD  Medicated and Controlled,(+)     substance abuse  ,   Endo/Other  diabetes  Renal/GU Renal disease     Musculoskeletal  (+) Arthritis , narcotic dependent  Abdominal   Peds  Hematology  (+) Blood dyscrasia, anemia ,   Anesthesia Other Findings thoracic wound infection  Reproductive/Obstetrics                            Anesthesia Physical Anesthesia Plan  ASA: 3 and emergent  Anesthesia Plan: General   Post-op Pain Management:    Induction: Intravenous  PONV Risk Score and Plan: 2 and Ondansetron, Dexamethasone, Midazolam and Treatment may vary due to age or medical condition  Airway Management Planned: Oral ETT  Additional Equipment:   Intra-op Plan:   Post-operative Plan: Extubation in OR  Informed Consent: I have reviewed the patients History and Physical, chart, labs and discussed the procedure including the risks, benefits and alternatives for the proposed anesthesia with the patient or authorized representative who has indicated his/her understanding and acceptance.     Dental advisory given  Plan Discussed with: CRNA  Anesthesia Plan Comments:        Anesthesia Quick Evaluation

## 2022-03-28 NOTE — Progress Notes (Signed)
Pharmacy Antibiotic Note  Justin Patel is a 71 y.o. male admitted to [facility] on 03/28/2022 with sepsis. Transferred from [place]  consulted for Vancomycin dosing and Zosyn for wound infection/ thoracic spine abscesses.    MRI thoracic spine significant for two abscesses in setting of recent thoracic spine surgery on 7/31. Patient was started empirically on Vancomycin and Cefepime at [facility]. Blood cultures from [facility] are no growth to date.  Plan: Vancomycin 1000 mg IV Q 12 hrs. Goal AUC 400-550. Expected AUC: 521,  SCr used: 0.8, Vd used 0.72 L/kg Zosyn 3.375 g IV q8h (EI)  Monitor clinical progress, renal function and cultures. Check steady state vancomycin levels per protocol if needed.      Temp (24hrs), Avg:97.9 F (36.6 C), Min:97.7 F (36.5 C), Max:98.2 F (36.8 C)  Recent Labs  Lab 03/24/22 1245 03/26/22 1051 03/27/22 1338 03/28/22 0631  WBC 10.9* 9.4  9.3 8.5  --   CREATININE 1.20 1.13 0.85 0.79     Estimated Creatinine Clearance: 83.1 mL/min (by C-G formula based on SCr of 0.79 mg/dL).    Allergies  Allergen Reactions   Altace [Ramipril] Anaphylaxis   Ace Inhibitors Hives   Levaquin [Levofloxacin In D5w] Hives   Lyrica [Pregabalin] Other (See Comments)    Edema   Metformin Diarrhea    High doses cause diarrhea.    Trulicity [Dulaglutide] Nausea And Vomiting    Antimicrobials this admission: Vanc 8/19 >> Cefepime 8/19 >8/20 Zosyn 8/20>>  Dose adjustments this admission: Vanc adjusted from '1500mg'$  Q24 to 1 g q12h  Cefepime adjusted from 2g Q12 to 2g Q8h. > discontinued  Microbiology results:  8/18 BCx x2: no growth to date.   Thank you for allowing pharmacy to be a part of this patient's care.  Nicole Cella, RPh Clinical Pharmacist Please check AMION for all [place] phone numbers After 10:00 PM, call [place] [phone]  03/28/2022 6:48 PM

## 2022-03-28 NOTE — Progress Notes (Signed)
Pacu RN Report to floor given  Gave report to  RN. Room: 6N04   Discussed surgery, meds given in OR and Pacu, VS, IV fluids given, EBL, urine output, pain and other pertinent information. Also discussed if pt had any family or friends here or belongings with them.    Discussed surgery, VSS, pain and meds given, no brace needed, moving all extremities, steady on his feet.    Pt exits my care.

## 2022-03-28 NOTE — Op Note (Signed)
03/28/2022  8:59 PM  PATIENT:  Justin Patel  71 y.o. male  PRE-OPERATIVE DIAGNOSIS: Bilateral thoracic wound infections for surgery  POST-OPERATIVE DIAGNOSIS:  same  PROCEDURE: Bilateral thoracic wound irrigation and debridement with primary closure  SURGEON:  Sherley Bounds, MD  ASSISTANTS: Glenford Peers FNP  ANESTHESIA:   General  EBL: Less than 50 ml  Total I/O In: 700 [I.V.:500; IV Piggyback:200] Out: 75 [Blood:50]  BLOOD ADMINISTERED: none  DRAINS: None  SPECIMEN: Operative cultures of both wounds  INDICATION FOR PROCEDURE: This patient presented with drainage and pain at his bilateral thoracic incisions after surgery 3 weeks ago by Dr. Trenton Gammon. Imaging showed bilateral subcutaneous fluid collections consistent with abscess.  Sed rate and CRP were high.  White blood cell count was high.  He was started on vancomycin and Zosyn empirically at an outside institution on Friday and then transferred here for further care today..Recommended irrigation and debridement of the wounds with cultures.. Patient understood the risks, benefits, and alternatives and potential outcomes and wished to proceed.  PROCEDURE DETAILS: The patient was taken to the operating room and after induction of adequate generalized endotracheal anesthesia, the patient was rolled into the prone position on the Wilson frame and all pressure points were padded. The thoracic and lumbar region was cleaned and then prepped with DuraPrep and draped in the usual sterile fashion. 5 cc of local anesthesia was injected and then his old incisions were incised elliptically around the incision and dissected down through the subcutaneous tissues.  There was immediate release of purulent fluid from the subcutaneous space.  This was cultured from both wounds.  Note this was 2 separate incisions.  We sharply and bluntly debrided the tissues down to the fascia.  We had nice bleeding tissue after this.  I irrigated with saline  solution containing bacitracin. Achieved hemostasis with bipolar cautery.  I closed the subcutaneous tissues with 2-0 Vicryl and the subcuticular tissues with 3-0 Vicryl. The skin was then closed with benzoin and Steri-Strips. The drapes were removed, a sterile dressing was applied.  My nurse practitioner was involved in the entirety of the procedure and performed with the debridement and washout and helped with the closure. the patient was awakened from general anesthesia and transferred to the recovery room in stable condition. At the end of the procedure all sponge, needle and instrument counts were correct.     PLAN OF CARE: Admit to inpatient    PATIENT DISPOSITION:  PACU - hemodynamically stable.   Delay start of Pharmacological VTE agent (>24hrs) due to surgical blood loss or risk of bleeding:  yes

## 2022-03-28 NOTE — ED Notes (Signed)
Patient signed transfer consent at this time. 

## 2022-03-29 ENCOUNTER — Encounter (HOSPITAL_COMMUNITY): Payer: Self-pay | Admitting: Neurological Surgery

## 2022-03-29 DIAGNOSIS — I10 Essential (primary) hypertension: Secondary | ICD-10-CM | POA: Diagnosis not present

## 2022-03-29 DIAGNOSIS — T8149XA Infection following a procedure, other surgical site, initial encounter: Secondary | ICD-10-CM | POA: Diagnosis not present

## 2022-03-29 DIAGNOSIS — N400 Enlarged prostate without lower urinary tract symptoms: Secondary | ICD-10-CM | POA: Diagnosis not present

## 2022-03-29 DIAGNOSIS — F419 Anxiety disorder, unspecified: Secondary | ICD-10-CM | POA: Diagnosis not present

## 2022-03-29 LAB — CBC
HCT: 33.5 % — ABNORMAL LOW (ref 39.0–52.0)
Hemoglobin: 10.6 g/dL — ABNORMAL LOW (ref 13.0–17.0)
MCH: 30.8 pg (ref 26.0–34.0)
MCHC: 31.6 g/dL (ref 30.0–36.0)
MCV: 97.4 fL (ref 80.0–100.0)
Platelets: 262 10*3/uL (ref 150–400)
RBC: 3.44 MIL/uL — ABNORMAL LOW (ref 4.22–5.81)
RDW: 17.1 % — ABNORMAL HIGH (ref 11.5–15.5)
WBC: 7.3 10*3/uL (ref 4.0–10.5)
nRBC: 0 % (ref 0.0–0.2)

## 2022-03-29 LAB — COMPREHENSIVE METABOLIC PANEL
ALT: 21 U/L (ref 0–44)
AST: 24 U/L (ref 15–41)
Albumin: 2.4 g/dL — ABNORMAL LOW (ref 3.5–5.0)
Alkaline Phosphatase: 50 U/L (ref 38–126)
Anion gap: 7 (ref 5–15)
BUN: 12 mg/dL (ref 8–23)
CO2: 27 mmol/L (ref 22–32)
Calcium: 8.5 mg/dL — ABNORMAL LOW (ref 8.9–10.3)
Chloride: 104 mmol/L (ref 98–111)
Creatinine, Ser: 1.1 mg/dL (ref 0.61–1.24)
GFR, Estimated: >60 mL/min (ref >60)
Glucose, Bld: 131 mg/dL — ABNORMAL HIGH (ref 70–99)
Potassium: 3.7 mmol/L (ref 3.5–5.1)
Sodium: 138 mmol/L (ref 135–145)
Total Bilirubin: 0.5 mg/dL (ref 0.3–1.2)
Total Protein: 5.4 g/dL — ABNORMAL LOW (ref 6.5–8.1)

## 2022-03-29 LAB — C-REACTIVE PROTEIN: CRP: 1.8 mg/dL — ABNORMAL HIGH (ref <1.0)

## 2022-03-29 LAB — SEDIMENTATION RATE: Sed Rate: 56 mm/hr — ABNORMAL HIGH (ref 0–16)

## 2022-03-29 LAB — GLUCOSE, CAPILLARY
Glucose-Capillary: 132 mg/dL — ABNORMAL HIGH (ref 70–99)
Glucose-Capillary: 78 mg/dL (ref 70–99)
Glucose-Capillary: 107 mg/dL — ABNORMAL HIGH (ref 70–99)
Glucose-Capillary: 210 mg/dL — ABNORMAL HIGH (ref 70–99)

## 2022-03-29 MED ORDER — OXYCODONE-ACETAMINOPHEN 5-325 MG PO TABS
1.0000 | ORAL_TABLET | ORAL | Status: DC | PRN
  Administered 2022-03-30 – 2022-04-01 (×6): 2 via ORAL
  Filled 2022-03-29 (×6): qty 2

## 2022-03-29 MED ORDER — MORPHINE SULFATE (PF) 4 MG/ML IV SOLN
4.0000 mg | INTRAVENOUS | Status: DC | PRN
  Administered 2022-03-29 – 2022-03-30 (×5): 4 mg via INTRAVENOUS
  Filled 2022-03-29 (×5): qty 1

## 2022-03-29 MED ORDER — VANCOMYCIN HCL 750 MG/150ML IV SOLN
750.0000 mg | Freq: Two times a day (BID) | INTRAVENOUS | Status: DC
  Administered 2022-03-29 – 2022-03-31 (×5): 750 mg via INTRAVENOUS
  Filled 2022-03-29 (×5): qty 150

## 2022-03-29 MED FILL — Iron Sucrose Inj 20 MG/ML (Fe Equiv): INTRAVENOUS | Qty: 10 | Status: AC

## 2022-03-29 NOTE — Progress Notes (Signed)
Pharmacy Antibiotic Note  Justin Patel is a 71 y.o. male admitted to [facility] on 03/28/2022 with wound infection / thoracic spine abscesses.  Pharmacy consulted for vancomycin and Zosyn dosing.  MRI thoracic spine significant for two abscesses in setting of recent thoracic spine surgery on 7/31.   SCr is trending up, afebrile, WBC WNL.  Plan: Adjust vanc to '750mg'$  IV Q12H for AUC 524 using SCr 1.1 Continue Zosyn EID 3.375gm IV Q8H Monitor renal fxn, micro data, clinical progress, vanc levels as indicated     Temp (24hrs), Avg:97.7 F (36.5 C), Min:97.2 F (36.2 C), Max:98.3 F (36.8 C)  Recent Labs  Lab 03/24/22 1245 03/26/22 1051 03/27/22 1338 03/28/22 0631 03/29/22 0140  WBC 10.9* 9.4  9.3 8.5  --  7.3  CREATININE 1.20 1.13 0.85 0.79 1.10     Estimated Creatinine Clearance: 60.5 mL/min (by C-G formula based on SCr of 1.1 mg/dL).    Allergies  Allergen Reactions   Altace [Ramipril] Anaphylaxis   Ace Inhibitors Hives   Levaquin [Levofloxacin In D5w] Hives   Lyrica [Pregabalin] Other (See Comments)    Edema   Metformin Diarrhea    High doses cause diarrhea.    Trulicity [Dulaglutide] Nausea And Vomiting    Vanc 8/18 >> Cefepime 8/19 >> 8/20 Zosyn 8/20 >>  8/18 BCx - NGTD 8/20 L thoracic cx - GPC on Gram stain  Jenisse Vullo D. Mina Marble, PharmD, BCPS, [place] 03/29/2022, 8:54 AM

## 2022-03-29 NOTE — Progress Notes (Signed)
Patient admitted yesterday with superficial wound infection.  Status post washout last night.  Patient with incisional pain.  No new neurologic symptoms.  No fevers.  Currently on vancomycin awaiting culture results.  Continue IV antibiotics and mobilization.

## 2022-03-29 NOTE — TOC Initial Note (Addendum)
Transition of Care [facility]) - Initial/Assessment Note    Patient Details  Name: Justin Patel MRN: [id number] Date of Birth: [birth year]  Transition of Care [facility] CM/SW Contact:    Marilu Favre, RN Phone Number: 03/29/2022, 10:39 AM  Clinical Narrative:                 Spoke to patient at bedside. Patient from home with wife.   Awaiting culture results. Possible discharge to home on IV antibiotics.   Discussed home IV ABX. Discussed PICC line ( patient already aware).   Discussed [facility] will be infusion company and they have a nurse who will come to hospital room and provide education to patient and wife prior to discharge on home IV ABX. He will have a nurse but nurse will not be in the home every time a dose id due.   Patient voiced understanding.   Have calls out to [employer] with [facility] and Jeannene Patella with [employer]   If IV ABX needed at home [facility] and [facility] can accept. Will need home health orders and OPAT once determined  Expected Discharge Plan: [place] Barriers to Discharge: Continued Medical Work up   Patient Goals and CMS Choice Patient states their goals for this hospitalization and ongoing recovery are:: to return to home CMS Medicare.gov Compare Post Acute Care list provided to:: Patient Choice offered to / list presented to : Patient  Expected Discharge Plan and Services Expected Discharge Plan: [place]   Discharge Planning Services: CM Consult Post Acute Care Choice: [place] arrangements for the past 2 months: Single Family Home                 DME Arranged: N/A                    Prior Living Arrangements/Services Living arrangements for the past 2 months: Single Family Home Lives with:: Spouse Patient language and need for interpreter reviewed:: Yes Do you feel safe going back to the place where you live?: Yes      Need for Family Participation in Patient Care: Yes (Comment) Care giver support  system in place?: Yes (comment)   Criminal Activity/Legal Involvement Pertinent to Current Situation/Hospitalization: No - Comment as needed  Activities of Daily Living      Permission Sought/Granted   Permission granted to share information with : Yes, Verbal Permission Granted     Permission granted to share info w AGENCY: [facility] and [facility]        Emotional Assessment Appearance:: Appears stated age Attitude/Demeanor/Rapport: Engaged Affect (typically observed): Accepting Orientation: : Oriented to Situation, Oriented to  Time, Oriented to Self, Oriented to Place Alcohol / Substance Use: Not Applicable Psych Involvement: No (comment)  Admission diagnosis:  Spinal abscess ([place]) [M46.20] Wound infection after surgery [T81.49XA] Patient Active Problem List   Diagnosis Date Noted   Spinal abscess ([place]) 03/28/2022   Wound infection after surgery 03/28/2022   Lumbar pseudoarthrosis 03/08/2022   Foot fracture 02/02/2022   Thoracic radiculopathy 02/01/2022   Iron deficiency 01/19/2022   History of fusion of thoracic spine 12/28/2021   Pain in thoracic spine 12/28/2021   Chronic back pain 09/25/2020   Aortic atherosclerosis ([place]) 05/20/2020   Common bile duct dilatation 05/20/2020   S/P laparoscopic cholecystectomy 02/18/2020   History of gastric bypass    Fracture of L1 vertebra ([place]) 01/14/2020   Itchy skin 12/04/2019   Arthralgia 08/17/2019   Mass  of submandibular region 08/17/2019   Pain of right thumb 08/17/2019   Numbness of face 05/22/2019   Chronic night sweats 03/21/2019   Chronic abdominal pain 02/27/2019   Weakness of both lower limbs 08/31/2018   Muscle weakness 08/24/2018   Dyspnea on exertion 08/24/2018   Depression, major, single episode, mild (HCC) 08/24/2018   Macrocytosis without anemia 07/05/2018   RLQ abdominal pain 05/18/2018   CKD (chronic kidney disease) stage 3, GFR 30-59 ml/min 05/08/2018   Lumbar scoliosis 04/25/2018   Easy bruising  12/09/2017   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   Abnormal weight loss 10/18/2017   GERD (gastroesophageal reflux disease) 10/18/2017   Constipation 10/18/2017   Osteoporosis without current pathological fracture 07/20/2017   Vitamin D deficiency 07/20/2017   Osteopenia determined by x-ray 04/25/2017   Rash 03/11/2017   Seborrheic dermatitis 02/24/2017   Intertrigo 02/24/2017   Tachycardia 09/27/2016   Anxiety 06/25/2016   Cervical facet syndrome 03/15/2016   Facet syndrome, lumbar 03/15/2016   DDD (degenerative disc disease), cervical 03/15/2016   DDD (degenerative disc disease), lumbar 03/15/2016   Other iron deficiency anemias 11/27/2015   Recurrent falls 07/25/2015   Chronic pain syndrome 04/16/2015   Alcohol abuse 10/01/2014   Syncope 06/03/2014   H/O total knee replacement 05/28/2014   BPH (benign prostatic hyperplasia) 11/16/2013   Enlarged prostate 11/08/2013   Anemia 11/08/2013   H/O urinary stone 10/05/2013   Renal cyst 08/27/2013   Diabetes ([place]) 03/21/2013   Essential hypertension, benign 03/21/2013   Hyperlipidemia 03/21/2013   Overweight (BMI 25.0-29.9) 03/21/2013   PCP:  [name], MD Pharmacy:   [place], [place] [address] [place] [address] Phone: [phone] Fax: [phone]  [id number] - [place], [place] - [address] [place] [address] Phone: [phone] Fax: [phone]     Social Determinants of Health (SDOH) Interventions    Readmission Risk Interventions     No data to display

## 2022-03-29 NOTE — Plan of Care (Signed)
Post op site intact with dressing. Vitals stable, pain 8-9/10, PRN med given. Problem: Pain Managment: Goal: General experience of comfort will improve Outcome: Progressing   Problem: Safety: Goal: Ability to remain free from injury will improve Outcome: Progressing

## 2022-03-29 NOTE — Hospital Course (Addendum)
Justin Patel is a 71 y.o. male with medical history significant of hypertension, anxiety, depression, multiple back surgeries (most recently removal of bilateral T10 pedicle screws), diabetes, GERD, s/p gastric bypass surgery. Patient presented secondary to wound drainage and found to have a thoracic back infection. Empiric Vancomycin and Zosyn started. Neurosurgery consulted ad patient was taken to surgery on 8/20 for debridement.

## 2022-03-29 NOTE — Consult Note (Signed)
   [facility] Inpatient Consult   03/29/2022  DRAE MITZEL March 30, 1951 [id number]   [employer] [ACO] Patient: Justin Patel PPO   Primary Care Provider: [name], MD with [facility], is an Independent Embedded provider with a Care Management team and program and is listed for the [facility] follow up needs     Patient screened unplanned readmission less than 30 days noted,  Reviewed to assess for Embedded Care Management service needs for post hospital transition for readmission prevention needs.  Review of patient's medical record reveals patient is being recommended currently for ongoing IV antibiotics for post hospital care.     Plan:  Reviewed inpatient TOC RNCM progress. Continue to follow progress and disposition to assess for post hospital care management needs. For questions contact:    Natividad Brood, RN BSN [facility] Liaison  [phone] business mobile phone Toll free office [phone]  Fax number: [phone] [email or web address]

## 2022-03-29 NOTE — Progress Notes (Signed)
Given orders to adjust patients pain medication morphine to '4mg'$  q 4 from '2mg'$  q 2 by Dr. Earnie Larsson

## 2022-03-29 NOTE — Progress Notes (Signed)
PROGRESS NOTE    Justin Patel  XBD:[id number] DOB: Jun 27, 1951 DOA: 03/28/2022 PCP: [name], MD   Brief Narrative: Justin Patel is a 71 y.o. male with medical history significant of hypertension, anxiety, depression, multiple back surgeries (most recently removal of bilateral T10 pedicle screws), diabetes, GERD, s/p gastric bypass surgery. Patient presented secondary to wound drainage and found to have a thoracic back infection. Empiric Vancomycin and Zosyn started. Neurosurgery consulted ad patient was taken to surgery on 8/20 for debridement.   Assessment and Plan:  Wound infection after surgery MRI thoracic spine significant for two abscesses in setting of recent thoracic spine surgery on 7/31. Patient was started empirically on Vancomycin and Cefepime at [facility]. Blood cultures from [facility] are no growth to date. Per neurosurgery, wound is superficial. -Neurosurgery recommendations pending -Follow-up blood culture (8/18) and wound culture (8/20) data -Vancomycin/Zosyn IV   Diabetes mellitus, type 2 Patient is on [place] as an outpatient. Hemoglobin A1C of 6.4% from 03/04/22. -SSI moderate   Primary hypertension -Continue metoprolol   Hyperlipidemia -Continue Crestor   Tremor -Continue primidone   BPH -Continue home Flomax   Depression Anxiety -Continue home Effexor   GERD -Continue Protonix (substituted for home Nexium)   DVT prophylaxis: Deferring VTE prophylaxis until 24 hours post-surgery Code Status:   Code Status: Full Code Family Communication: None at bedside Disposition Plan: Discharge likely home pending neurosurgery recommendations and outpatient antibiotic regimen   Consultants:  Neurosurgery  Procedures:  Thoracic wound washout  Antimicrobials: Vancomycin Cefepime Zosyn    Subjective: Patient reports back pain. No other issues  Objective: BP 112/64 (BP Location: Right Arm)   Pulse (!) 110   Temp (!) 97.4 F  (36.3 C) (Oral)   Resp 16   SpO2 98%   Examination:  General exam: Appears calm and comfortable Respiratory system: Clear to auscultation. Respiratory effort normal. Cardiovascular system: S1 & S2 heard, RRR. No murmurs, rubs, gallops or clicks. Gastrointestinal system: Abdomen is nondistended, soft and nontender. Normal bowel sounds heard. Central nervous system: Alert and oriented. No focal neurological deficits. Musculoskeletal: No edema. No calf tenderness Skin: No cyanosis. No rashes Psychiatry: Judgement and insight appear normal. Mood & affect appropriate.    Data Reviewed: I have personally reviewed following labs and imaging studies  CBC Lab Results  Component Value Date   WBC 7.3 03/29/2022   RBC 3.44 (L) 03/29/2022   HGB 10.6 (L) 03/29/2022   HCT 33.5 (L) 03/29/2022   MCV 97.4 03/29/2022   MCH 30.8 03/29/2022   PLT 262 03/29/2022   MCHC 31.6 03/29/2022   RDW 17.1 (H) 03/29/2022   LYMPHSABS 1.3 03/27/2022   MONOABS 0.7 03/27/2022   EOSABS 0.2 03/27/2022   BASOSABS 0.0 83/41/9622     Last metabolic panel Lab Results  Component Value Date   NA 138 03/29/2022   K 3.7 03/29/2022   CL 104 03/29/2022   CO2 27 03/29/2022   BUN 12 03/29/2022   CREATININE 1.10 03/29/2022   GLUCOSE 131 (H) 03/29/2022   GFRNONAA >60 03/29/2022   GFRAA >60 01/30/2020   CALCIUM 8.5 (L) 03/29/2022   PROT 5.4 (L) 03/29/2022   ALBUMIN 2.4 (L) 03/29/2022   LABGLOB 2.4 02/15/2019   AGRATIO 1.9 02/15/2019   BILITOT 0.5 03/29/2022   ALKPHOS 50 03/29/2022   AST 24 03/29/2022   ALT 21 03/29/2022   ANIONGAP 7 03/29/2022    GFR: Estimated Creatinine Clearance: 60.5 mL/min (by C-G formula based on SCr of  1.1 mg/dL).  Recent Results (from the past 240 hour(s))  Blood culture (routine x 2)     Status: None (Preliminary result)   Collection Time: 03/26/22  6:10 PM   Specimen: BLOOD  Result Value Ref Range Status   Specimen Description BLOOD RIGTH AC  Final   Special Requests    Final    BOTTLES DRAWN AEROBIC AND ANAEROBIC Blood Culture results may not be optimal due to an excessive volume of blood received in culture bottles   Culture   Final    NO GROWTH 3 DAYS Performed at [facility], [address], [place], [place] [address]    Report Status PENDING  Incomplete  Blood culture (routine x 2)     Status: None (Preliminary result)   Collection Time: 03/26/22  6:10 PM   Specimen: BLOOD  Result Value Ref Range Status   Specimen Description BLOOD LEFT FA  Final   Special Requests   Final    BOTTLES DRAWN AEROBIC AND ANAEROBIC Blood Culture results may not be optimal due to an inadequate volume of blood received in culture bottles   Culture   Final    NO GROWTH 3 DAYS Performed at [facility], [address] [place], [place] [address]    Report Status PENDING  Incomplete  Aerobic/Anaerobic Culture w Gram Stain (surgical/deep wound)     Status: None (Preliminary result)   Collection Time: 03/28/22  8:32 PM   Specimen: PATH Other; Tissue  Result Value Ref Range Status   Specimen Description WOUND  Final   Special Requests LEFT THORACIC SURGICAL SPECIMEN A  Final   Gram Stain   Final    MODERATE WBC PRESENT,BOTH PMN AND MONONUCLEAR FEW GRAM POSITIVE COCCI IN PAIRS    Culture   Final    TOO YOUNG TO READ Performed at [facility], [place] [address], [place], [place] [address]    Report Status PENDING  Incomplete  Aerobic/Anaerobic Culture w Gram Stain (surgical/deep wound)     Status: None (Preliminary result)   Collection Time: 03/28/22  8:38 PM   Specimen: PATH Other; Tissue  Result Value Ref Range Status   Specimen Description WOUND  Final   Special Requests RIGHT THORACIC SURGICAL WOUND SPECIMEN B  Final   Gram Stain PENDING  Incomplete   Culture   Final    TOO YOUNG TO READ Performed at [facility], [place] [address], [place], [place] [address]    Report Status PENDING  Incomplete      Radiology Studies: No  results found.    LOS: 1 day    Cordelia Poche, MD [facility] 03/29/2022, 11:00 AM   If 7PM-7AM, please contact night-coverage [email or web address]

## 2022-03-30 ENCOUNTER — Inpatient Hospital Stay: Payer: Medicare PPO

## 2022-03-30 ENCOUNTER — Other Ambulatory Visit: Payer: Self-pay | Admitting: Cardiovascular Disease

## 2022-03-30 DIAGNOSIS — I10 Essential (primary) hypertension: Secondary | ICD-10-CM | POA: Diagnosis not present

## 2022-03-30 DIAGNOSIS — N400 Enlarged prostate without lower urinary tract symptoms: Secondary | ICD-10-CM | POA: Diagnosis not present

## 2022-03-30 DIAGNOSIS — T8149XA Infection following a procedure, other surgical site, initial encounter: Secondary | ICD-10-CM | POA: Diagnosis not present

## 2022-03-30 DIAGNOSIS — E119 Type 2 diabetes mellitus without complications: Secondary | ICD-10-CM

## 2022-03-30 DIAGNOSIS — F419 Anxiety disorder, unspecified: Secondary | ICD-10-CM | POA: Diagnosis not present

## 2022-03-30 LAB — CBC
HCT: 32.5 % — ABNORMAL LOW (ref 39.0–52.0)
Hemoglobin: 10.3 g/dL — ABNORMAL LOW (ref 13.0–17.0)
MCH: 30.7 pg (ref 26.0–34.0)
MCHC: 31.7 g/dL (ref 30.0–36.0)
MCV: 97 fL (ref 80.0–100.0)
Platelets: 263 10*3/uL (ref 150–400)
RBC: 3.35 MIL/uL — ABNORMAL LOW (ref 4.22–5.81)
RDW: 17.2 % — ABNORMAL HIGH (ref 11.5–15.5)
WBC: 7.4 10*3/uL (ref 4.0–10.5)
nRBC: 0 % (ref 0.0–0.2)

## 2022-03-30 LAB — BASIC METABOLIC PANEL
Anion gap: 5 (ref 5–15)
BUN: 14 mg/dL (ref 8–23)
CO2: 26 mmol/L (ref 22–32)
Calcium: 8.1 mg/dL — ABNORMAL LOW (ref 8.9–10.3)
Chloride: 107 mmol/L (ref 98–111)
Creatinine, Ser: 1.07 mg/dL (ref 0.61–1.24)
GFR, Estimated: >60 mL/min (ref >60)
Glucose, Bld: 111 mg/dL — ABNORMAL HIGH (ref 70–99)
Potassium: 3.7 mmol/L (ref 3.5–5.1)
Sodium: 138 mmol/L (ref 135–145)

## 2022-03-30 LAB — GLUCOSE, CAPILLARY
Glucose-Capillary: 121 mg/dL — ABNORMAL HIGH (ref 70–99)
Glucose-Capillary: 143 mg/dL — ABNORMAL HIGH (ref 70–99)
Glucose-Capillary: 196 mg/dL — ABNORMAL HIGH (ref 70–99)
Glucose-Capillary: 102 mg/dL — ABNORMAL HIGH (ref 70–99)

## 2022-03-30 MED ORDER — ENOXAPARIN SODIUM 40 MG/0.4ML IJ SOSY
40.0000 mg | PREFILLED_SYRINGE | INTRAMUSCULAR | Status: DC
Start: 2022-03-30 — End: 2022-04-01
  Administered 2022-03-30 – 2022-04-01 (×3): 40 mg via SUBCUTANEOUS
  Filled 2022-03-30 (×3): qty 0.4

## 2022-03-30 MED ORDER — HYDROMORPHONE HCL 1 MG/ML IJ SOLN
0.5000 mg | INTRAMUSCULAR | Status: DC | PRN
  Administered 2022-03-30 – 2022-04-01 (×8): 0.5 mg via INTRAVENOUS
  Filled 2022-03-30 (×8): qty 0.5

## 2022-03-30 MED ORDER — KETOROLAC TROMETHAMINE 15 MG/ML IJ SOLN
15.0000 mg | Freq: Four times a day (QID) | INTRAMUSCULAR | Status: DC
  Administered 2022-03-30 – 2022-04-01 (×7): 15 mg via INTRAVENOUS
  Filled 2022-03-30 (×7): qty 1

## 2022-03-30 MED ORDER — POLYETHYLENE GLYCOL 3350 17 G PO PACK
17.0000 g | PACK | Freq: Every day | ORAL | Status: DC
  Administered 2022-03-30: 17 g via ORAL
  Filled 2022-03-30: qty 1

## 2022-03-30 NOTE — Progress Notes (Signed)
   Providing Compassionate, Quality Care - Together   Subjective: Patient reports an increase in back pain overnight. He denies numbness, tingling ,or weakness in his extremities.  Objective: Vital signs in last 24 hours: Temp:  [97.8 F (36.6 C)-98.3 F (36.8 C)] 97.9 F (36.6 C) (08/22 0747) Pulse Rate:  [60-69] 63 (08/22 0747) Resp:  [16-18] 18 (08/22 0747) BP: (109-138)/(65-84) 138/71 (08/22 0747) SpO2:  [92 %-99 %] 95 % (08/22 0747)  Intake/Output from previous day: 08/21 0701 - 08/22 0700 In: 1182.1 [P.O.:360; I.V.:521.5; IV Piggyback:300.6] Out: 1275 [Urine:1275] Intake/Output this shift: No intake/output data recorded.  Alert and oriented x 4 PERRLA CN II-XII grossly intact MAE, Strength and sensation intact Incision is covered with Honeycomb dressing and Steri Strips; Dressing is clean, dry, and intact   Lab Results: Recent Labs    03/29/22 0140 03/30/22 0132  WBC 7.3 7.4  HGB 10.6* 10.3*  HCT 33.5* 32.5*  PLT 262 263   BMET Recent Labs    03/29/22 0140 03/30/22 0132  NA 138 138  K 3.7 3.7  CL 104 107  CO2 27 26  GLUCOSE 131* 111*  BUN 12 14  CREATININE 1.10 1.07  CALCIUM 8.5* 8.1*    Studies/Results: No results found.  Assessment/Plan: Patient underwent wound washout on 03/28/2022 by Dr. Ronnald Ramp. His wound cultures have grown Staph aureus. Awaiting sensitivities.   LOS: 2 days   -Will modify the patient's medication for breakthrough pain. -ID is leaning towards oral abx at discharge.   Viona Gilmore, DNP, AGNP-C Nurse Practitioner  [facility] [place] [address], [address], [place], [place] [address] P: [phone]    F: [phone]  03/30/2022, 2:18 PM

## 2022-03-30 NOTE — Consult Note (Addendum)
[place] [facility] Infect[facility]                                                                                        Patient Identification: Patient Name: Justin Patel MRN: [id number] [place] Date: 03/28/2022  3:57 PM Today's Date: 03/30/2022 Reason for consult: thoracic surgical site infection Requesting provider: Cordelia Poche  Principal Problem:   Wound infection after surgery Active Problems:   Diabetes [facility])   Essential hypertension, benign   Hyperlipidemia   BPH (benign prostatic hyperplasia)   Anxiety   Depression, major, single episode, mild (HCC)   Antibiotics:  Vancomycin 8/18-c Cefepime 8/18-8/20, Zosyn 8/20-c  Lines/Hardware: cervical, thoraco-lumbar hardware, left TKA  Assessment 71 Y O male with PMH as below including anterior cervical c3-c7  decompression/discectomy fusion 2013, Rt anterolateral lumbar L3-L5  interbody Instrumented fusion 2019 ,decompressive Lumbar laminectomy with pedicle screw fixation from Thoracic Ten to Lumbar Two with posterolateral arthrodesis 2021 followed by removal of bilateral thoracic ten pedicle screws 02/2022 who initially presented to [facility] hospital 8/18 for drainage from the thoracic surgical site/pain for 1 day.  # Superficial surgical site infection in the setting of recent thoracic surgery  - Per OR note, it seems to be superficial infection with no deep extension beneath the fascia. Not bacteremic, no fevers, mild elevation in WBC and CRP. OR cx Staph aureus    Recommendations  DC Zosyn. Continue Vancomycin., pharmacy to dose Fu blood cultures  Fu sensi of staph aureus  Will d/w Neurosurgery, plan to do PO abtx for 2 weeks  if no concerns for deep tissue infection from Neurosurgery  D/w ID pharm D Following  Rest of the management as per the primary team. Please call with questions or concerns.  Thank you for the consult  Rosiland Oz,  MD Infectious Disease Physician [facility] [address] [place], [place] [address] Phone: [phone]  Fax: [phone]  __________________________________________________________________________________________________________ HPI and Hospital Course: 12 Y O male with PMH as below including anterior cervical c3-c7  decompression/discectomy fusion 2013, Rt anterolateral lumbar L3-L5  interbody Instrumented fusion 2019 ,decompressive Lumbar laminectomy with pedicle screw fixation from Thoracic Ten to Lumbar Two with posterolateral arthrodesis 2021 followed by removal of bilateral thoracic ten pedicle screws 02/2022 who initially presented to [facility] hospital 8/18 for drainage from the thoracic surgical site/pain for 1 day. Denies fevers, chills, sweats. Denies nausea, vomiting and diarrhea. Denies taking any prior po abtx. Patient finally transferred to [facility] 8/20 for neurosurgery evaluation.   At [facility] ED, afebrile, no leukocytosis.  8/18 Blood cx NG in 3 days  8/20 s/p bilateral thoracic wound irrigation and debridement with primary closure. 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  removal scheduled to clear margins.   CHF (congestive heart failure) ([place])  no longer after weight loss   Chronic kidney disease    cysts   Degenerative disc disease    Depression    Diabetes mellitus    no longer diabetic-or on meds   DJD (degenerative joint disease)    Dysplastic nevus 02/04/2016   R upper back paraspinal - severe   Family history of adverse reaction to anesthesia    sons wake up combative   Foot fracture, left 01/2022   Gastric ulcer    GERD (gastroesophageal  reflux disease)    H/O hiatal hernia    Headache(784.0)    sinus   Heart murmur    Hx of congestive heart failure    Hyperlipidemia    Hypertension    Iron deficiency anemia 02/28/2015   Lumbar scoliosis    Opiate abuse, continuous ([place]) 06/20/2015   Sacral fracture, closed ([place])    Seizures ([place])    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, after GI bleed   Sleep apnea    hx not now since wt loss   Stones in the urinary tract    Syncope and collapse    Past Surgical History:  Procedure Laterality Date   ANTERIOR CERVICAL DECOMP/DISCECTOMY FUSION  01/26/2012   Procedure: ANTERIOR CERVICAL DECOMPRESSION/DISCECTOMY FUSION 3 LEVELS;  Surgeon: Floyce Stakes, MD;  [facility]: [facility];  Service: Neurosurgery;  Laterality: N/A;  Cervical three-four Cervical four-five Cervical five-six Cervical six-seven , Anterior cervical decompression/diskectomy, fusion, plate   ANTERIOR LAT LUMBAR FUSION Right 04/25/2018   Procedure: Right Lumbar two-three Lumbar three-four Lumbar four-five anterior  lateral interbody fusion  with posterior percutaneous pedicle screws;  Surgeon: Erline Levine, MD;  Location: [place];  Service: Neurosurgery;  Laterality: Right;   [place], normal   CARDIAC CATHETERIZATION     [place]     COLONOSCOPY WITH PROPOFOL N/A 12/27/2017   Procedure: COLONOSCOPY WITH PROPOFOL;  Surgeon: Tol[place], Benay Pike, MD;  Location: [facility] ENDOSCOPY;  Service: Gastroenterology;  Laterality: N/A;   ESOPHAGOGASTRODUODENOSCOPY (EGD) WITH PROPOFOL N/A 12/27/2017   Procedure: ESOPHAGOGASTRODUODENOSCOPY (EGD) WITH PROPOFOL;  Surgeon: Toledo, Benay Pike, MD;  Location: [facility] ENDOSCOPY;  Service: Gastroenterology;  Laterality: N/A;   ESOPHAGOGASTRODUODENOSCOPY (EGD) WITH PROPOFOL N/A 06/19/2021   Procedure: ESOPHAGOGASTRODUODENOSCOPY (EGD) WITH PROPOFOL;  Surgeon: [name], MD;  Location: [facility] ENDOSCOPY;  Service:  Gastroenterology;  Laterality: N/A;   ESOPHAGOGASTRODUODENOSCOPY (EGD) WITH PROPOFOL N/A 09/25/2021   Procedure: ESOPHAGOGASTRODUODENOSCOPY (EGD) WITH PROPOFOL;  Surgeon: [name], MD;  Location: [facility] ENDOSCOPY;  Service: Endoscopy;  Laterality: N/A;   GASTRIC BYPASS  08/09/2008   [employer]   HARDWARE REMOVAL N/A 03/08/2022   Procedure: Removal of bilateral Thoracic ten pedicle screws;  Surgeon: Earnie Larsson, MD;  Location: [place];  Service: Neurosurgery;  Laterality: N/A;   HERNIA REPAIR  08/09/1998   hiatal   JOINT REPLACEMENT     KNEE ARTHROSCOPY     bilateral, left x 2   LAMINECTOMY WITH POSTERIOR LATERAL ARTHRODESIS LEVEL 4 N/A 01/14/2020   Procedure: Decompressive Laminectomy Lumbar One with pedicle screw fixation from Thoracic Ten to Lumbar Two;  Surgeon: Erline Levine, MD;  Location: [place];  Service: Neurosurgery;  Laterality: N/A;  Decompressive Laminectomy Lumbar One with pedicle screw fixation from Thoracic Ten to Lumbar Two   LUMBAR PERCUTANEOUS PEDICLE SCREW 3 LEVEL N/A 04/25/2018   Procedure: LUMBAR PERCUTANEOUS PEDICLE SCREW 3 LEVEL;  Surgeon: Erline Levine, MD;  Location:  [place] [facility];  Service: Neurosurgery;  Laterality: N/A;   NASAL SINUS SURGERY     x5   OSTEOTOMY  08/10/1999   left   ROUX-EN-Y GASTRIC BYPASS     THORACIC LAMINECTOMY FOR EPIDURAL ABSCESS N/A 03/28/2022   Procedure: THORACIC WOUND WASHOUT;  Surgeon: Eustace Moore, MD;  Location: [place];  Service: Neurosurgery;  Laterality: N/A;   TONSILLECTOMY     TOTAL KNEE ARTHROPLASTY Left 05/09/2014   Dr. Marry Guan   UVULOPALATOPLASTY  08/09/2009     Scheduled Meds:  docusate sodium  100 mg Oral BID   enoxaparin (LOVENOX) injection  40 mg Subcutaneous Q24H   ezetimibe  10 mg Oral Daily   furosemide  20 mg Oral Daily   gabapentin  300 mg Oral TID   insulin aspart  0-15 Units Subcutaneous TID WC   metoprolol succinate  50 mg Oral Daily   pantoprazole  40 mg Oral Daily   polyethylene glycol  17 g Oral Daily    primidone  50 mg Oral QHS   rosuvastatin  40 mg Oral Daily   senna  1 tablet Oral BID   sodium chloride flush  3 mL Intravenous Q12H   sucralfate  1 g Oral QID   tamsulosin  0.4 mg Oral Daily   venlafaxine XR  75 mg Oral Q breakfast   Continuous Infusions:  sodium chloride Stopped (03/29/22 1320)   piperacillin-tazobactam (ZOSYN)  IV 3.375 g (03/30/22 1341)   vancomycin 750 mg (03/30/22 1222)   PRN Meds:.acetaminophen **OR** acetaminophen, albuterol, menthol-cetylpyridinium **OR** phenol, morphine injection, ondansetron **OR** ondansetron (ZOFRAN) IV, oxyCODONE-acetaminophen, sodium chloride flush  Allergies  Allergen Reactions   Altace [Ramipril] Anaphylaxis   Ace Inhibitors Hives   Levaquin [Levofloxacin In D5w] Hives   Lyrica [Pregabalin] Other (See Comments)    Edema   Metformin Diarrhea    High doses cause diarrhea.    Trulicity [Dulaglutide] Nausea And Vomiting   Social History   Socioeconomic History   Marital status: Married    Spouse name: Not on file   Number of children: 2   Years of education: Not on file   Highest education level: Not on file  Occupational History   Not on file  Tobacco Use   Smoking status: Never   Smokeless tobacco: Never  Vaping Use   Vaping Use: Never used  Substance and Sexual Activity   Alcohol use: Yes    Comment: occassionally   Drug use: No   Sexual activity: Yes    Partners: Female  Other Topics Concern   Not on file  Social History Narrative   Lives in [place] with wife, [place]. 2 sons, William Hamburger, Lennette Bihari 5.. 4 grandchildren      Work - Retired, previously taught music in [place] - regular diet, limited quantities after gastric bypass      Exercise - no regular, limited by arthritis in knees, occasional water aerobics   Right handed   One story house   Social Determinants of Health   Financial Resource Strain: Medium Risk (03/09/2022)   Overall Financial Resource Strain (CARDIA)     Difficulty of Paying Living Expenses: Somewhat hard  Food Insecurity: No Food Insecurity (03/09/2022)   Hunger Vital Sign    Worried About Running Out of Food in the Last Year: Never true    Ran Out of Food in the Last Year: Never true  Transportation Needs: No Transportation Needs (03/09/2022)   PRAPARE -  Hydrologist (Medical): No    Lack of Transportation (Non-Medical): No  Physical Activity: Unknown (06/22/2021)   Exercise Vital Sign    Days of Exercise per Week: 0 days    Minutes of Exercise per Session: Not on file  Stress: No Stress Concern Present (06/22/2021)   [place]    Feeling of Stress : Not at all  Social Connections: Unknown (06/22/2021)   Social Connection and Isolation Panel [NHANES]    Frequency of Communication with Friends and Family: Not on file    Frequency of Social Gatherings with Friends and Family: More than three times a week    Attends Religious Services: Not on file    Active Member of Clubs or Organizations: Not on file    Attends Archivist Meetings: Not on file    Marital Status: Married  Intimate Partner Violence: Not At Risk (06/22/2021)   Humiliation, Afraid, Rape, and Kick questionnaire    Fear of Current or Ex-Partner: No    Emotionally Abused: No    Physically Abused: No    Sexually Abused: No   Family History  Problem Relation Age of Onset   Dementia Mother 28   Osteoporosis Mother    Heart disease Father 19   Diabetes Father    Lymphoma Sister        lymphoma, stage 4   Ovarian cancer Sister 25       Ovarian   Lupus Sister    Prostate cancer Maternal Uncle    Skin cancer Maternal Uncle    Tongue cancer Maternal Uncle        uncle died of heart attack   Alcoholism Maternal Uncle    Lung cancer Paternal Aunt        pat aunts x 4 died of lung cancer   Lung cancer Paternal Aunt    Lung cancer Paternal Aunt    Lung cancer Paternal  Aunt    Mesothelioma Cousin        paternal cousin     Vitals BP 138/71 (BP Location: Right Arm)   Pulse 63   Temp 97.9 F (36.6 C) (Oral)   Resp 18   SpO2 95%    Physical Exam Constitutional:  lying in the bed, pain seems to be well controlled    Comments:   Cardiovascular:     Rate and Rhythm: Normal rate and regular rhythm.     Heart sounds:  Pulmonary:     Effort: Pulmonary effort is normal.     Comments: Normal breath sounds   Abdominal:     Palpations: Abdomen is soft.     Tenderness: non distended and non tender   Musculoskeletal:        General: No swelling or tenderness in peripheral joints. Minimal pedal edema   Skin:    Comments: no obvious rashes, thoracic back wound bandaged C/D/I - no surrounding cellulitis, fluctuance  Neurological:     General: Grossly non focal, awake, alert and oriented   Psychiatric:        Mood and Affect: Mood normal.    Pertinent Microbiology Quantiferon 2019 + Results for orders placed or performed during the hospital encounter of 03/28/22  Aerobic/Anaerobic Culture w Gram Stain (surgical/deep wound)     Status: None (Preliminary result)   Collection Time: 03/28/22  8:32 PM   Specimen: PATH Other; Tissue  Result Value Ref Range Status   Specimen Description  WOUND  Final   Special Requests LEFT THORACIC SURGICAL SPECIMEN A  Final   Gram Stain   Final    MODERATE WBC PRESENT,BOTH PMN AND MONONUCLEAR FEW GRAM POSITIVE COCCI IN PAIRS    Culture   Final    RARE STAPHYLOCOCCUS AUREUS SUSCEPTIBILITIES TO FOLLOW Performed at [facility], [place] [address], [place], [place] [address]    Report Status PENDING  Incomplete  Aerobic/Anaerobic Culture w Gram Stain (surgical/deep wound)     Status: None (Preliminary result)   Collection Time: 03/28/22  8:38 PM   Specimen: PATH Other; Tissue  Result Value Ref Range Status   Specimen Description WOUND  Final   Special Requests RIGHT THORACIC SURGICAL WOUND SPECIMEN B   Final   Gram Stain PENDING  Incomplete   Culture   Final    FEW STAPHYLOCOCCUS AUREUS CULTURE REINCUBATED FOR BETTER GROWTH Performed at [facility], [place] [address], [place], [place] [address]    Report Status PENDING  Incomplete   Pertinent Lab seen by me:    Latest Ref Rng & Units 03/30/2022    1:32 AM 03/29/2022    1:40 AM 03/27/2022    1:38 PM  CBC  WBC 4.0 - 10.5 K/uL 7.4  7.3  8.5   Hemoglobin 13.0 - 17.0 g/dL 10.3  10.6  11.3   Hematocrit 39.0 - 52.0 % 32.5  33.5  36.3   Platelets 150 - 400 K/uL 263  262  315       Latest Ref Rng & Units 03/30/2022    1:32 AM 03/29/2022    1:40 AM 03/28/2022    6:31 AM  CMP  Glucose 70 - 99 mg/dL 111  131  110   BUN 8 - 23 mg/dL '14  12  11   '$ Creatinine 0.61 - 1.24 mg/dL 1.07  1.10  0.79   Sodium 135 - 145 mmol/L 138  138  137   Potassium 3.5 - 5.1 mmol/L 3.7  3.7  3.9   Chloride 98 - 111 mmol/L 107  104  109   CO2 22 - 32 mmol/L '26  27  24   '$ Calcium 8.9 - 10.3 mg/dL 8.1  8.5  8.3   Total Protein 6.5 - 8.1 g/dL  5.4    Total Bilirubin 0.3 - 1.2 mg/dL  0.5    Alkaline Phos 38 - 126 U/L  50    AST 15 - 41 U/L  24    ALT 0 - 44 U/L  21       Pertinent Imagings/Other Imagings Plain films and CT images have been personally visualized and interpreted; radiology reports have been reviewed. Decision making incorporated into the Impression / Recommendations.  MR THORACIC SPINE W WO CONTRAST  Result Date: 03/26/2022 CLINICAL DATA:  Drainage from incision from spinal fusion in July, epidural abscess suspected EXAM: MRI THORACIC WITHOUT AND WITH CONTRAST TECHNIQUE: Multiplanar and multiecho pulse sequences of the thoracic spine were obtained without and with intravenous contrast. CONTRAST:  42m GADAVIST GADOBUTROL 1 MMOL/ML IV SOLN COMPARISON:  CT thoracic spine 01/22/2022 and CT T and L-spine 12/17/2021; no prior MRI of the thoracic spine. FINDINGS: Alignment: Exaggeration of the normal thoracic kyphosis superiorly. No significant  listhesis in the thoracic spine. Vertebrae: Status post ACDF C3-C7 and posterior fusion T11 through the lumbar spine. Interval removal of previously noted screws at T10. Susceptibility artifact from hardware limits evaluation. Increased T2 signal and enhancement are seen along the screw tracts and  in the endplates about the A1-O87 disc space. No abnormal signal within the disc itself. Redemonstrated compression deformity of L1, which appears unchanged compared to 12/17/2021 otherwise normal marrow signal. Cord: Normal signal and morphology. No abnormal enhancement. No evidence of epidural collection. Paraspinal and other soft tissues: Postsurgical changes in the back, with 2 fluid collections that demonstrate surrounding enhancement, which appear to connect to the skin surface (series 20, image 34 and series 23, image 24). The right collection measures up to 0.8 x 2.3 x 4.0 cm (series 20, image 35 and series 24, image 5). The left collection is not imaged completely on the sagittal sequences but measures up to 1.6 x 2.4 cm in the axial plane (series 20, image 35). Foci of air are noted within the collections, concerning for gas-forming organisms. These collections do not appear to extend to the thecal sac. Disc levels: No high-grade spinal canal stenosis or neural foraminal narrowing. IMPRESSION: 1. Postsurgical changes in the back, with 2 fluid collections with surrounding enhancement, which appear to connect to the skin surface and contain foci of air, concerning for abscess with gas-forming organisms. No evidence of a connection between the collections and the spinal canal. No epidural abscess. 2. T2 hyperintense signal and enhancement along the screw tracts at T10 is favored to reflect edema related to recent screw removal rather than infection, although infection could appear similar. No abnormal signal in the T9-T10 disc. Electronically Signed   By: Merilyn Baba M.D.   On: 03/26/2022 23:28   DG Thoracic  Spine 2 View  Result Date: 03/26/2022 CLINICAL DATA:  Pain, open wound in the back, previous surgical fusion EXAM: THORACIC SPINE 2 VIEWS COMPARISON:  Radiographs done on 09/10/2020 and CT done on 01/22/2022 FINDINGS: No recent fracture is seen. There is surgical fusion extending from T11 through the lumbar spine. Lower extent of fusion is not included. There is a anterior surgical fusion in cervical spine from C3-C7 levels. Degenerative changes are noted with bony spurs. There are surgical clips and staples in epigastrium. Old healed fractures are seen in multiple left ribs. There is interval removal of surgical hardware from T10 vertebra. IMPRESSION: No recent fracture is seen. Degenerative changes are noted. There is evidence of surgical fusion in cervical spine, lower thoracic spine and lumbar spine. Electronically Signed   By: Elmer Picker M.D.   On: 03/26/2022 12:37    I spent 100 minutes for this patient encounter including review of prior medical records/discussing diagnostics and treatment plan with the patient/family/coordinate care with primary/other specialits with greater than 50% of time in face to face encounter.   Electronically signed by:   Rosiland Oz, MD Infectious Disease Physician [facility] for Infectious Disease Pager: [phone]

## 2022-03-30 NOTE — Progress Notes (Signed)
PROGRESS NOTE    Justin Patel  XBL:[id number] DOB: Feb 04, 1951 DOA: 03/28/2022 PCP: [name], MD   Brief Narrative: Justin Patel is a 71 y.o. male with medical history significant of hypertension, anxiety, depression, multiple back surgeries (most recently removal of bilateral T10 pedicle screws), diabetes, GERD, s/p gastric bypass surgery. Patient presented secondary to wound drainage and found to have a thoracic back infection. Empiric Vancomycin and Zosyn started. Neurosurgery consulted ad patient was taken to surgery on 8/20 for debridement.   Assessment and Plan:  Wound infection after surgery MRI thoracic spine significant for two abscesses in setting of recent thoracic spine surgery on 7/31. Patient was started empirically on Vancomycin and Cefepime at [facility]. Blood cultures from [facility] are no growth to date x3 days. Per neurosurgery, wound is superficial. Wound cultures growing staph aureus; sensitivities pending. -Neurosurgery recommendations: IV antibiotics and mobilization -Pain management: per neurosurgery -Follow-up blood culture (8/18) and wound culture (8/20) data -Vancomycin/Zosyn IV   Diabetes mellitus, type 2 Patient is on [place] as an outpatient. Hemoglobin A1C of 6.4% from 03/04/22. -SSI moderate   Primary hypertension -Continue metoprolol   Hyperlipidemia -Continue Crestor   Tremor -Continue primidone   BPH -Continue home Flomax   Depression Anxiety -Continue home Effexor   GERD -Continue Protonix (substituted for home Nexium)   DVT prophylaxis: Lovenox Code Status:   Code Status: Full Code Family Communication: None at bedside Disposition Plan: Discharge likely home pending neurosurgery/ID recommendations and outpatient antibiotic regimen. Anticipate discharge home with home health in 1-2 days   Consultants:  Neurosurgery Infectious disease  Procedures:  Thoracic wound  washout  Antimicrobials: Vancomycin Cefepime Zosyn    Subjective: Patient reports continued pain. Morphine IV is helping but Percocet is not.  Objective: BP 138/71 (BP Location: Right Arm)   Pulse 63   Temp 97.9 F (36.6 C) (Oral)   Resp 18   SpO2 95%   Examination:  General exam: Appears calm and comfortable Respiratory system: Clear to auscultation. Respiratory effort normal. Cardiovascular system: S1 & S2 heard, RRR. No murmurs, rubs, gallops or clicks. Gastrointestinal system: Abdomen is distended, soft and non-tender. Normal bowel sounds heard. Central nervous system: Alert and oriented. No focal neurological deficits. Musculoskeletal: No calf tenderness Skin: No cyanosis. No rashes Psychiatry: Judgement and insight appear normal. Mood & affect appropriate.    Data Reviewed: I have personally reviewed following labs and imaging studies  CBC Lab Results  Component Value Date   WBC 7.4 03/30/2022   RBC 3.35 (L) 03/30/2022   HGB 10.3 (L) 03/30/2022   HCT 32.5 (L) 03/30/2022   MCV 97.0 03/30/2022   MCH 30.7 03/30/2022   PLT 263 03/30/2022   MCHC 31.7 03/30/2022   RDW 17.2 (H) 03/30/2022   LYMPHSABS 1.3 03/27/2022   MONOABS 0.7 03/27/2022   EOSABS 0.2 03/27/2022   BASOSABS 0.0 30/02/6225     Last metabolic panel Lab Results  Component Value Date   NA 138 03/30/2022   K 3.7 03/30/2022   CL 107 03/30/2022   CO2 26 03/30/2022   BUN 14 03/30/2022   CREATININE 1.07 03/30/2022   GLUCOSE 111 (H) 03/30/2022   GFRNONAA >60 03/30/2022   GFRAA >60 01/30/2020   CALCIUM 8.1 (L) 03/30/2022   PROT 5.4 (L) 03/29/2022   ALBUMIN 2.4 (L) 03/29/2022   LABGLOB 2.4 02/15/2019   AGRATIO 1.9 02/15/2019   BILITOT 0.5 03/29/2022   ALKPHOS 50 03/29/2022   AST 24 03/29/2022   ALT 21  03/29/2022   ANIONGAP 5 03/30/2022    GFR: Estimated Creatinine Clearance: 62.2 mL/min (by C-G formula based on SCr of 1.07 mg/dL).  Recent Results (from the past 240 hour(s))  Blood  culture (routine x 2)     Status: None (Preliminary result)   Collection Time: 03/26/22  6:10 PM   Specimen: BLOOD  Result Value Ref Range Status   Specimen Description BLOOD RIGTH AC  Final   Special Requests   Final    BOTTLES DRAWN AEROBIC AND ANAEROBIC Blood Culture results may not be optimal due to an excessive volume of blood received in culture bottles   Culture   Final    NO GROWTH 3 DAYS Performed at [facility], [address] [place], [place] [address]    Report Status PENDING  Incomplete  Blood culture (routine x 2)     Status: None (Preliminary result)   Collection Time: 03/26/22  6:10 PM   Specimen: BLOOD  Result Value Ref Range Status   Specimen Description BLOOD LEFT FA  Final   Special Requests   Final    BOTTLES DRAWN AEROBIC AND ANAEROBIC Blood Culture results may not be optimal due to an inadequate volume of blood received in culture bottles   Culture   Final    NO GROWTH 3 DAYS Performed at [facility], [address] [place], [place] [address]    Report Status PENDING  Incomplete  Aerobic/Anaerobic Culture w Gram Stain (surgical/deep wound)     Status: None (Preliminary result)   Collection Time: 03/28/22  8:32 PM   Specimen: PATH Other; Tissue  Result Value Ref Range Status   Specimen Description WOUND  Final   Special Requests LEFT THORACIC SURGICAL SPECIMEN A  Final   Gram Stain   Final    MODERATE WBC PRESENT,BOTH PMN AND MONONUCLEAR FEW GRAM POSITIVE COCCI IN PAIRS    Culture   Final    RARE STAPHYLOCOCCUS AUREUS SUSCEPTIBILITIES TO FOLLOW Performed at [facility], [place] [address], [place],  [address]    Report Status PENDING  Incomplete  Aerobic/Anaerobic Culture w Gram Stain (surgical/deep wound)     Status: None (Preliminary result)   Collection Time: 03/28/22  8:38 PM   Specimen: PATH Other; Tissue  Result Value Ref Range Status   Specimen Description WOUND  Final   Special Requests RIGHT THORACIC  SURGICAL WOUND SPECIMEN B  Final   Gram Stain PENDING  Incomplete   Culture   Final    FEW STAPHYLOCOCCUS AUREUS CULTURE REINCUBATED FOR BETTER GROWTH Performed at [facility], [place] [address], [place],  [address]    Report Status PENDING  Incomplete      Radiology Studies: No results found.    LOS: 2 days    Cordelia Poche, MD [facility] 03/30/2022, 10:32 AM   If 7PM-7AM, please contact night-coverage [email or web address]

## 2022-03-31 ENCOUNTER — Telehealth: Payer: Self-pay

## 2022-03-31 ENCOUNTER — Other Ambulatory Visit: Payer: Self-pay

## 2022-03-31 ENCOUNTER — Encounter: Payer: Self-pay | Admitting: Internal Medicine

## 2022-03-31 ENCOUNTER — Other Ambulatory Visit (HOSPITAL_COMMUNITY): Payer: Self-pay

## 2022-03-31 DIAGNOSIS — T8149XA Infection following a procedure, other surgical site, initial encounter: Secondary | ICD-10-CM | POA: Diagnosis not present

## 2022-03-31 LAB — CULTURE, BLOOD (ROUTINE X 2)
Culture: NO GROWTH
Culture: NO GROWTH

## 2022-03-31 LAB — GLUCOSE, CAPILLARY
Glucose-Capillary: 106 mg/dL — ABNORMAL HIGH (ref 70–99)
Glucose-Capillary: 124 mg/dL — ABNORMAL HIGH (ref 70–99)
Glucose-Capillary: 115 mg/dL — ABNORMAL HIGH (ref 70–99)
Glucose-Capillary: 111 mg/dL — ABNORMAL HIGH (ref 70–99)

## 2022-03-31 MED ORDER — POLYETHYLENE GLYCOL 3350 17 G PO PACK
17.0000 g | PACK | Freq: Two times a day (BID) | ORAL | Status: DC
  Administered 2022-03-31 (×2): 17 g via ORAL
  Filled 2022-03-31 (×2): qty 1

## 2022-03-31 MED ORDER — CEFAZOLIN SODIUM-DEXTROSE 2-4 GM/100ML-% IV SOLN
2.0000 g | Freq: Three times a day (TID) | INTRAVENOUS | Status: DC
  Administered 2022-03-31 – 2022-04-01 (×3): 2 g via INTRAVENOUS
  Filled 2022-03-31 (×3): qty 100

## 2022-03-31 NOTE — Progress Notes (Signed)
Looks better today.  Pain much better controlled.  No other complaints.  Afebrile.  Vital signs are stable.  Wound is healing well.  Wound is nontender.  No evidence of swelling or reaccumulation.  Dressings are dry.  He is awake and alert.  Oriented and appropriate.  He appears in no distress.  His motor and sensory function are stable.  Culture sensitivities remain pending.  Overall progressing well.  Mobilize more today.  Plan for discharge home tomorrow on oral antibiotics once bacteria identified.

## 2022-03-31 NOTE — Plan of Care (Signed)
  Problem: Education: Goal: Knowledge of General Education information will improve Description Including pain rating scale, medication(s)/side effects and non-pharmacologic comfort measures Outcome: Progressing   Problem: Health Behavior/Discharge Planning: Goal: Ability to manage health-related needs will improve Outcome: Progressing   

## 2022-03-31 NOTE — Progress Notes (Signed)
Mobility Specialist - Progress Note   03/31/22 1059  Mobility  Activity Ambulated independently in hallway  Level of Assistance Independent  Assistive Device None  Distance Ambulated (ft) 1100 ft  Activity Response Tolerated well  $Mobility charge 1 Mobility    Pt received in recliner and agreeable to mobility. Took seated break x1. Left in room w/call bell and all needs met.   Paulla Dolly Mobility Specialist

## 2022-03-31 NOTE — Progress Notes (Signed)
PROGRESS NOTE    Justin Patel  TSV:[id number] DOB: 02/01/1951 DOA: 03/28/2022 PCP: [name], MD   Brief Narrative:  Justin Patel is a 71 y.o. male with medical history significant of hypertension, anxiety, depression, multiple back surgeries (most recently removal of bilateral T10 pedicle screws), diabetes, GERD, s/p gastric bypass surgery. Patient presented secondary to wound drainage and found to have a thoracic back infection. Empiric Vancomycin and Zosyn started. Neurosurgery consulted ad patient was taken to surgery on 8/20 for debridement.   Assessment & Plan:   Principal Problem:   Wound infection after surgery Active Problems:   Diabetes ([place])   Essential hypertension, benign   Hyperlipidemia   BPH (benign prostatic hyperplasia)   Anxiety   Depression, major, single episode, mild (HCC)  Wound infection after surgery MRI thoracic spine significant for two abscesses in setting of recent thoracic spine surgery on 7/31. Patient was started empirically on Vancomycin and Cefepime at [facility]. Blood cultures from [facility] are no growth to date x3 days. Per neurosurgery, wound is superficial. Wound cultures growing staph aureus; sensitivities pending. -Neurosurgery recommendations: IV antibiotics and mobilization -Pain management: per neurosurgery -currently remains on IV dilaudid/percocet and reports poor control -we discussed need to wean off IV Dilaudid prior to discharge -Follow-up blood culture (8/18) and wound culture (8/20) data -Initially on vancomycin/Zosyn IV - zosyn discontinued 8/22 -continue on vancomycin only, final recommendations pending culture and sensitivity, appreciate ID recommendations - will likely need 2 weeks of antibiotics at discharge   Diabetes mellitus, type 2 Patient is on [place] as an outpatient. Hemoglobin A1C of 6.4% from 03/04/22. -SSI moderate   Primary hypertension -Continue metoprolol   Hyperlipidemia -Continue  Crestor   Tremor -Continue primidone   BPH -Continue home Flomax   Depression Anxiety -Continue home Effexor   GERD -Continue Protonix (substituted for home Nexium)     DVT prophylaxis: Lovenox Code Status:   Code Status: Full Code Family Communication: None at bedside Disposition Plan: Discharge likely home pending neurosurgery/ID recommendations and outpatient antibiotic regimen. Anticipate discharge home with home health in 1-2 days  Status is: Inpatient  Dispo: The patient is from: Home              Anticipated d/c is to: Home              Anticipated d/c date is: 24 to 48 hours               Patient currently not medically stable for discharge given ongoing need for IV pain control, IV antibiotics pending culture speciation and sensitivities  Consultants:  ID,  neurosurgery  Procedures:  Bilateral thoracic wound irrigation and debridement with primary closure 03/28/2022  Antimicrobials:  Vancomycin, ongoing Zosyn discontinued 03/30/2022  Subjective: No acute issues or events overnight, back pain still poorly controlled per patient requesting increasing IV narcotics which we discussed was not appropriate given his potential discharge and would recommend increasing p.o. scheduled and as needed pain medications in hopes to better control pain at discharge.  Otherwise denies nausea vomiting diarrhea constipation headache fevers chills or chest pain.  Objective: Vitals:   03/30/22 1553 03/30/22 2002 03/31/22 0604 03/31/22 0720  BP: 121/75 128/83 (!) 144/85 (!) 144/96  Pulse: 79 68 (!) 58 (!) 58  Resp: '18 17 17 16  '$ Temp: 98 F (36.7 C) 98.9 F (37.2 C) 98.1 F (36.7 C) 97.7 F (36.5 C)  TempSrc:  Oral  Oral  SpO2: 97% 97% (!) 89% 94%  Intake/Output Summary (Last 24 hours) at 03/31/2022 0723 Last data filed at 03/31/2022 0455 Gross per 24 hour  Intake 360 ml  Output 300 ml  Net 60 ml   There were no vitals filed for this visit.  Examination:  General:   Pleasantly resting in bed, No acute distress. HEENT:  Normocephalic atraumatic.  Sclerae nonicteric, noninjected.  Extraocular movements intact bilaterally. Neck:  Without mass or deformity.  Trachea is midline. Lungs:  Clear to auscultate bilaterally without rhonchi, wheeze, or rales. Heart:  Regular rate and rhythm.  Without murmurs, rubs, or gallops. Abdomen:  Soft, nontender, nondistended.  Without guarding or rebound. Extremities: Without cyanosis, clubbing, edema, or obvious deformity. Vascular:  Dorsalis pedis and posterior tibial pulses palpable bilaterally. Skin: Posterior thoracic postsurgical bandage clean dry intact    Data Reviewed: I have personally reviewed following labs and imaging studies  CBC: Recent Labs  Lab 03/24/22 1245 03/26/22 1051 03/27/22 1338 03/29/22 0140 03/30/22 0132  WBC 10.9* 9.4  9.3 8.5 7.3 7.4  NEUTROABS 8.9* 6.8 6.3  --   --   HGB 12.4* 12.5*  12.5* 11.3* 10.6* 10.3*  HCT 39.5 40.8  40.9 36.3* 33.5* 32.5*  MCV 97.5 98.6  96.7 97.6 97.4 97.0  PLT 315 383  370 315 262 606   Basic Metabolic Panel: Recent Labs  Lab 03/26/22 1051 03/27/22 1338 03/28/22 0631 03/29/22 0140 03/30/22 0132  NA 138 136 137 138 138  K 3.5 3.9 3.9 3.7 3.7  CL 105 103 109 104 107  CO2 '23 26 24 27 26  '$ GLUCOSE 149* 124* 110* 131* 111*  BUN '18 14 11 12 14  '$ CREATININE 1.13 0.85 0.79 1.10 1.07  CALCIUM 9.3 8.8* 8.3* 8.5* 8.1*   GFR: Estimated Creatinine Clearance: 62.2 mL/min (by C-G formula based on SCr of 1.07 mg/dL). Liver Function Tests: Recent Labs  Lab 03/26/22 1051 03/29/22 0140  AST 22 24  ALT 20 21  ALKPHOS 60 50  BILITOT 0.5 0.5  PROT 7.4 5.4*  ALBUMIN 3.4* 2.4*   CBG: Recent Labs  Lab 03/29/22 1948 03/30/22 0747 03/30/22 1149 03/30/22 1552 03/30/22 2004  GLUCAP 210* 121* 143* 196* 102*   Sepsis Labs: Recent Labs  Lab 03/26/22 1051 03/27/22 1336 03/28/22 0631  PROCALCITON 0.40 0.19 0.11    Recent Results (from the past 240  hour(s))  Blood culture (routine x 2)     Status: None (Preliminary result)   Collection Time: 03/26/22  6:10 PM   Specimen: BLOOD  Result Value Ref Range Status   Specimen Description BLOOD RIGTH [facility]  Final   Special Requests   Final    BOTTLES DRAWN AEROBIC AND ANAEROBIC Blood Culture results may not be optimal due to an excessive volume of blood received in culture bottles   Culture   Final    NO GROWTH 3 DAYS Performed at [facility], [address] [place], [place] [address]    Report Status PENDING  Incomplete  Blood culture (routine x 2)     Status: None (Preliminary result)   Collection Time: 03/26/22  6:10 PM   Specimen: BLOOD  Result Value Ref Range Status   Specimen Description BLOOD LEFT FA  Final   Special Requests   Final    BOTTLES DRAWN AEROBIC AND ANAEROBIC Blood Culture results may not be optimal due to an inadequate volume of blood received in culture bottles   Culture   Final    NO GROWTH 3 DAYS Performed at [facility], [place], [place] [address]    Report Status PENDING  Incomplete  Aerobic/Anaerobic Culture w Gram Stain (surgical/deep wound)     Status: None (Preliminary result)   Collection Time: 03/28/22  8:32 PM   Specimen: PATH Other; Tissue  Result Value Ref Range Status   Specimen Description WOUND  Final   Special Requests LEFT THORACIC SURGICAL SPECIMEN A  Final   Gram Stain   Final    MODERATE WBC PRESENT,BOTH PMN AND MONONUCLEAR FEW GRAM POSITIVE COCCI IN PAIRS    Culture   Final    RARE STAPHYLOCOCCUS AUREUS SUSCEPTIBILITIES TO FOLLOW Performed at [facility], [place] [address], [place], [place] [address]    Report Status PENDING  Incomplete  Aerobic/Anaerobic Culture w Gram Stain (surgical/deep wound)     Status: None (Preliminary result)   Collection Time: 03/28/22  8:38 PM   Specimen: PATH Other; Tissue  Result Value Ref Range Status   Specimen Description WOUND  Final   Special Requests RIGHT  THORACIC SURGICAL WOUND SPECIMEN B  Final   Gram Stain PENDING  Incomplete   Culture   Final    FEW STAPHYLOCOCCUS AUREUS CULTURE REINCUBATED FOR BETTER GROWTH Performed at [facility], [place] [address] [place], [place] [address]    Report Status PENDING  Incomplete    Radiology Studies: No results found.  Scheduled Meds:  docusate sodium  100 mg Oral BID   enoxaparin (LOVENOX) injection  40 mg Subcutaneous Q24H   ezetimibe  10 mg Oral Daily   furosemide  20 mg Oral Daily   gabapentin  300 mg Oral TID   insulin aspart  0-15 Units Subcutaneous TID WC   ketorolac  15 mg Intravenous Q6H   metoprolol succinate  50 mg Oral Daily   pantoprazole  40 mg Oral Daily   polyethylene glycol  17 g Oral Daily   primidone  50 mg Oral QHS   rosuvastatin  40 mg Oral Daily   senna  1 tablet Oral BID   sodium chloride flush  3 mL Intravenous Q12H   sucralfate  1 g Oral QID   tamsulosin  0.4 mg Oral Daily   venlafaxine XR  75 mg Oral Q breakfast   Continuous Infusions:  sodium chloride Stopped (03/29/22 1320)   vancomycin 750 mg (03/30/22 2309)     LOS: 3 days   Time spent: 5mn  Aleta Manternach C Reuel Lamadrid, DO [facility]  If 7PM-7AM, please contact night-coverage [email or web address]  03/31/2022, 7:23 AM

## 2022-03-31 NOTE — Progress Notes (Addendum)
Patient and wife was asking if its safe for the patient to take Toradol since the patient have Hx of GI bleed. This Probation officer clarified it with  Amado Coe, MD and MD stated it's "OK to give short term". Pt made aware what the doctor said.  Will continue to monitor patient

## 2022-03-31 NOTE — Consult Note (Signed)
   [facility] Inpatient Consult   03/31/2022  Justin Patel 04-May-1951 [id number]  [place] [facility] [ACO] Patient: Justin Patel Medicare PPO  Primary Care Provider:  [name], MD with [place] [facility]   Update:  Follow up for [facility] Readmission Report   Met with the patient at the bedside. Explained to patient the Embedded provider office for post hospital support with the Care Coordination team Patient is awaiting results and states, "I may or may not need IV antibiotics, we are still waiting on the cultures."  We spoke about ongoing follow up, faith, and post hospital needs.  Plan:  Follow for disposition and can refer to the Embedded CM team as needed.  For questions,  Natividad Brood, RN BSN [facility] Liaison  [phone] business mobile phone Toll free office [phone]  Fax number: [phone] [email or web address]

## 2022-03-31 NOTE — Telephone Encounter (Signed)
Pharmacy Patient Advocate Encounter  Insurance verification completed.    The patient is insured through [employer]   The patient is currently admitted and ran test claims for the following: Zyvox.  Copays and coinsurance results were relayed to Inpatient clinical team.

## 2022-03-31 NOTE — TOC Benefit Eligibility Note (Signed)
Patient Research scientist (life sciences) completed.     The patient is currently admitted and upon discharge could be taking Zyvox.   The current 30 day co-pay is, $10.   The patient is insured through [employer].

## 2022-03-31 NOTE — Progress Notes (Signed)
ID Brief Note      Component 3 d ago  Specimen Description WOUND   Special Requests LEFT THORACIC SURGICAL SPECIMEN A   Gram Stain MODERATE WBC PRESENT,BOTH PMN AND MONONUCLEAR  FEW GRAM POSITIVE COCCI IN PAIRS  Performed at [facility], [place] [address] [place], [place] [address]   Culture RARE STAPHYLOCOCCUS AUREUS  NO ANAEROBES ISOLATED; CULTURE IN PROGRESS FOR 5 DAYS   Report Status PENDING   Organism ID, Bacteria STAPHYLOCOCCUS AUREUS   Resulting Agency [facility]     Susceptibility   Staphylococcus aureus    MIC    CIPROFLOXACIN <=0.5 SENSI... Sensitive    CLINDAMYCIN <=0.25 SENS... Sensitive    ERYTHROMYCIN >=8 RESISTANT  Resistant    GENTAMICIN <=0.5 SENSI... Sensitive    Inducible Clindamycin NEGATIVE  Sensitive    OXACILLIN 0.5 SENSITIVE  Sensitive    RIFAMPIN <=0.5 SENSI... Sensitive    TETRACYCLINE <=1 SENSITIVE  Sensitive    TRIMETH/SULFA <=10 SENSIT... Sensitive    VANCOMYCIN <=0.5 SENSI... Sensitive           Susceptibility Comments  Staphylococcus aureus  RARE STAPHYLOCOCCUS AUREUS          Recommendations DC Vancomycin, start cefazolin ( pharmacy order placed)  Rosiland Oz, MD Infectious Disease Physician [facility] [address] [place], [place] [address] Phone: [phone]  Fax: [phone]

## 2022-03-31 NOTE — Progress Notes (Signed)
Pharmacy Antibiotic Note  Justin Patel is a 71 y.o. male admitted to [facility] on 03/28/2022 with wound infection / thoracic spine abscesses.  Pharmacy consulted for vancomycin and Zosyn dosing.  MRI thoracic spine significant for two abscesses in setting of recent thoracic spine surgery on 7/31.   Wound culture came back with MSSA. ID change vanc to cefazolin.  Scr 1.07  Plan: Dc vanc Cefazolin 2g IV q8  Height: '5\' 5"'$  (165.1 cm) Weight: 81.3 kg (179 lb 3.7 oz) IBW/kg (Calculated) : 61.5  Temp (24hrs), Avg:98.2 F (36.8 C), Min:97.7 F (36.5 C), Max:98.9 F (37.2 C)  Recent Labs  Lab 03/26/22 1051 03/27/22 1338 03/28/22 0631 03/29/22 0140 03/30/22 0132  WBC 9.4  9.3 8.5  --  7.3 7.4  CREATININE 1.13 0.85 0.79 1.10 1.07     Estimated Creatinine Clearance: 62.2 mL/min (by C-G formula based on SCr of 1.07 mg/dL).    Allergies  Allergen Reactions   Altace [Ramipril] Anaphylaxis   Ace Inhibitors Hives   Levaquin [Levofloxacin In D5w] Hives   Lyrica [Pregabalin] Other (See Comments)    Edema   Metformin Diarrhea    High doses cause diarrhea.    Trulicity [Dulaglutide] Nausea And Vomiting    Vanc 8/18 >>823 Cefepime 8/19 >> 8/20 Zosyn 8/20 >>8/22 Cefazolin 8/23>>  8/18 BCx - NGTD 8/20 L thoracic cx - MSSA 8/20 R thoracic cx - MSSA  Onnie Boer, PharmD, BCIDP, AAHIVP, CPP Infectious Disease Pharmacist 03/31/2022 8:04 PM

## 2022-03-31 NOTE — Care Management Important Message (Signed)
Important Message  Patient Details  Name: Justin Patel MRN: [id number] Date of Birth: 04-09-1951   Medicare Important Message Given:  Yes     Hannah Beat 03/31/2022, 11:25 AM

## 2022-04-01 DIAGNOSIS — T8149XA Infection following a procedure, other surgical site, initial encounter: Secondary | ICD-10-CM | POA: Diagnosis not present

## 2022-04-01 LAB — SEDIMENTATION RATE: Sed Rate: 60 mm/hr — ABNORMAL HIGH (ref 0–16)

## 2022-04-01 LAB — GLUCOSE, CAPILLARY
Glucose-Capillary: 126 mg/dL — ABNORMAL HIGH (ref 70–99)
Glucose-Capillary: 156 mg/dL — ABNORMAL HIGH (ref 70–99)

## 2022-04-01 LAB — C-REACTIVE PROTEIN: CRP: 0.7 mg/dL (ref <1.0)

## 2022-04-01 MED ORDER — POLYETHYLENE GLYCOL 3350 17 G PO PACK: 17.0000 g | PACK | Freq: Three times a day (TID) | ORAL | 0 refills | Status: AC

## 2022-04-01 MED ORDER — OXYCODONE-ACETAMINOPHEN 5-325 MG PO TABS: 1.0000 | ORAL_TABLET | ORAL | 0 refills | Status: DC | PRN

## 2022-04-01 MED ORDER — CEPHALEXIN 500 MG PO CAPS: 500.0000 mg | ORAL_CAPSULE | Freq: Four times a day (QID) | ORAL | 0 refills | Status: AC

## 2022-04-01 MED ORDER — POLYETHYLENE GLYCOL 3350 17 G PO PACK
17.0000 g | PACK | Freq: Three times a day (TID) | ORAL | Status: DC
  Administered 2022-04-01: 17 g via ORAL
  Filled 2022-04-01: qty 1

## 2022-04-01 MED ORDER — ONDANSETRON HCL 4 MG PO TABS: 4.0000 mg | ORAL_TABLET | Freq: Every day | ORAL | 1 refills | Status: DC | PRN

## 2022-04-01 NOTE — Progress Notes (Addendum)
[facility] Infectious Diseases Follow Up Note  Patient Identification: Patient Name: Justin Patel MRN: [id number] [place] Date: 03/28/2022  3:57 PM Age: 71 y.o.Today's Date: 04/01/2022  Reason for Visit: Fu on surgical site infection   Principal Problem:   Wound infection after surgery Active Problems:   Diabetes [facility])   Essential hypertension, benign   Hyperlipidemia   BPH (benign prostatic hyperplasia)   Anxiety   Depression, major, single episode, mild (HCC)  Antibiotics:  Vancomycin 8/18-8/23, cefazolin 8/23-c Cefepime 8/18-8/20, Zosyn 8/20-8/21   Lines/Hardware: cervical, thoraco-lumbar hardware, left TKA   Interval Events: remains afebrile, OR cx with MSSA   Assessment 57 Y O male with PMH as below including anterior cervical c3-c7  decompression/discectomy fusion 2013, Rt anterolateral lumbar L3-L5  interbody Instrumented fusion 2019 ,decompressive Lumbar laminectomy with pedicle screw fixation from Thoracic Ten to Lumbar Two with posterolateral arthrodesis 2021 followed by removal of bilateral thoracic ten pedicle screws 02/2022 who initially presented to [facility] hospital 8/18 for drainage from the thoracic surgical site/pain for 1 day.   # Superficial surgical site infection in the context of recent thoracic surgery  - Per OR note, it seems to be superficial infection with no deep extension beneath the fascia. Not bacteremic, no fevers, mild elevation in WBC and CRP. OR cx MSSA. D/w neurosurgery - no deeper infection beneath fascia with no concerns of hardware involvement. Plan for 2 weeks of antibiotics post OR date and close fu in the clinic. D/w patient regarding signs and symptoms of recurrence and need to seek immediate attention in that case.   Recommendations OK to switch to cephalexin for discharge. Plan for 2 weeks course from 8/20 ( including IV and P0). EOT 04/11/22  Will fu closely in the clinic, appt in 2-3  weeks D/w ID pharm D  ID will sign off for now, please call with questions   Rest of the management as per the primary team. Thank you for the consult. Please page with pertinent questions or concerns.  ______________________________________________________________________ Subjective patient seen and examined at the bedside.  No complaints  Discussed with signs and symptoms or recurrence of thoracic infection, he would like to avoid PICC line if not definitely indicated.   Vitals BP (!) 156/92 (BP Location: Right Arm)   Pulse (!) 56   Temp 98 F (36.7 C) (Oral)   Resp 18   Ht '5\' 5"'$  (1.651 m)   Wt 81.3 kg   SpO2 93%   BMI 29.83 kg/m     Physical Exam Constitutional:  lying in the bed, appears comfortable     Comments:   Cardiovascular:     Rate and Rhythm: Normal rate and regular rhythm.     Heart sounds:  Pulmonary:     Effort: Pulmonary effort is normal on room air     Comments:   Abdominal:     Palpations: Abdomen is soft.     Tenderness: non distended, non tender   Musculoskeletal:        General: No swelling or tenderness in peripeheral joints. Posterior thoracic back incision is bandaged C/D/I  Skin:    Comments:   Neurological:     General: Grossly non focal, awake, alert and oriented   Psychiatric:        Mood and Affect: Mood normal.   Pertinent Microbiology Results for orders placed or performed during the hospital encounter of 03/28/22  Aerobic/Anaerobic Culture w Gram Stain (surgical/deep wound)     Status: None (Preliminary result)  Collection Time: 03/28/22  8:32 PM   Specimen: PATH Other; Tissue  Result Value Ref Range Status   Specimen Description WOUND  Final   Special Requests LEFT THORACIC SURGICAL SPECIMEN A  Final   Gram Stain   Final    MODERATE WBC PRESENT,BOTH PMN AND MONONUCLEAR FEW GRAM POSITIVE COCCI IN PAIRS Performed at [facility], [place] [address], [place], [place] [address]    Culture   Final    RARE  STAPHYLOCOCCUS AUREUS NO ANAEROBES ISOLATED; CULTURE IN PROGRESS FOR 5 DAYS    Report Status PENDING  Incomplete   Organism ID, Bacteria STAPHYLOCOCCUS AUREUS  Final      Susceptibility   Staphylococcus aureus - MIC*    CIPROFLOXACIN <=0.5 SENSITIVE Sensitive     ERYTHROMYCIN >=8 RESISTANT Resistant     GENTAMICIN <=0.5 SENSITIVE Sensitive     OXACILLIN 0.5 SENSITIVE Sensitive     TETRACYCLINE <=1 SENSITIVE Sensitive     VANCOMYCIN <=0.5 SENSITIVE Sensitive     TRIMETH/SULFA <=10 SENSITIVE Sensitive     CLINDAMYCIN <=0.25 SENSITIVE Sensitive     RIFAMPIN <=0.5 SENSITIVE Sensitive     Inducible Clindamycin NEGATIVE Sensitive     * RARE STAPHYLOCOCCUS AUREUS  Aerobic/Anaerobic Culture w Gram Stain (surgical/deep wound)     Status: None (Preliminary result)   Collection Time: 03/28/22  8:38 PM   Specimen: PATH Other; Tissue  Result Value Ref Range Status   Specimen Description WOUND  Final   Special Requests RIGHT THORACIC SURGICAL WOUND SPECIMEN B  Final   Gram Stain PENDING  Incomplete   Culture   Final    FEW STAPHYLOCOCCUS AUREUS SUSCEPTIBILITIES TO FOLLOW Performed at [facility], [place] [address], [place], [place] [address]    Report Status PENDING  Incomplete   Pertinent Lab.    Latest Ref Rng & Units 03/30/2022    1:32 AM 03/29/2022    1:40 AM 03/27/2022    1:38 PM  CBC  WBC 4.0 - 10.5 K/uL 7.4  7.3  8.5   Hemoglobin 13.0 - 17.0 g/dL 10.3  10.6  11.3   Hematocrit 39.0 - 52.0 % 32.5  33.5  36.3   Platelets 150 - 400 K/uL 263  262  315       Latest Ref Rng & Units 03/30/2022    1:32 AM 03/29/2022    1:40 AM 03/28/2022    6:31 AM  CMP  Glucose 70 - 99 mg/dL 111  131  110   BUN 8 - 23 mg/dL '14  12  11   '$ Creatinine 0.61 - 1.24 mg/dL 1.07  1.10  0.79   Sodium 135 - 145 mmol/L 138  138  137   Potassium 3.5 - 5.1 mmol/L 3.7  3.7  3.9   Chloride 98 - 111 mmol/L 107  104  109   CO2 22 - 32 mmol/L '26  27  24   '$ Calcium 8.9 - 10.3 mg/dL 8.1  8.5  8.3   Total Protein  6.5 - 8.1 g/dL  5.4    Total Bilirubin 0.3 - 1.2 mg/dL  0.5    Alkaline Phos 38 - 126 U/L  50    AST 15 - 41 U/L  24    ALT 0 - 44 U/L  21       Pertinent Imaging today Plain films and CT images have been personally visualized and interpreted; radiology reports have been reviewed. Decision making incorporated into the Impression / Recommendations.  No results found.   I spent 51 minutes for this patient encounter including review of prior medical records, coordination of care with primary/other specialist with greater than 50% of time being face to face/counseling and discussing diagnostics/treatment plan with the patient/family.  Electronically signed by:   Rosiland Oz, MD Infectious Disease Physician [facility] Pager: [phone]

## 2022-04-01 NOTE — Discharge Summary (Signed)
Physician Discharge Summary  Patient ID: Justin Patel MRN: [id number] DOB/AGE: July 29, 1951 71 y.o.  Admit date: 03/28/2022 Discharge date: 04/01/2022  Admission Diagnoses:  Discharge Diagnoses:  Principal Problem:   Wound infection after surgery Active Problems:   Diabetes ([place])   Essential hypertension, benign   Hyperlipidemia   BPH (benign prostatic hyperplasia)   Anxiety   Depression, major, single episode, mild ([place])   Discharged Condition: good  Hospital Course: Patient admitted to the hospital with superficial wound infection following removal of loosened hardware in his upper thoracic spine.  Patient taken the operating room where irrigation and debridement of the wounds was performed.  Cultures were taken which reveal methicillin sensitive Staph aureus.  Patient shows no signs of sepsis.  He has no evidence of deep wound space infection.  He has been on 3 days of IV antibiotics and now will be transferred to oral cephalexin and plan for discharge home.  Currently his pain is well controlled.  He is having no new neurologic symptoms.  Consults:   Significant Diagnostic Studies:   Treatments:   Discharge Exam: Blood pressure (!) 156/92, pulse (!) 56, temperature 98 F (36.7 C), temperature source Oral, resp. rate 18, height 5' 5"  (1.651 m), weight 81.3 kg, SpO2 93 %. Awake and alert.  Oriented and appropriate.  Motor and sensory function intact.  Wound healing well.  Chest and abdomen benign.  Disposition:  There are no questions and answers to display.        Discharge Instructions     Incentive spirometry RT   Complete by: As directed       Allergies as of 04/01/2022       Reactions   Altace [ramipril] Anaphylaxis   Ace Inhibitors Hives   Levaquin [levofloxacin In D5w] Hives   Lyrica [pregabalin] Other (See Comments)   Edema   Metformin Diarrhea   High doses cause diarrhea.   Trulicity [dulaglutide] Nausea And Vomiting        Medication List      STOP taking these medications    traMADol 50 MG tablet Commonly known as: ULTRAM       TAKE these medications    acetaminophen 325 MG tablet Commonly known as: TYLENOL Take 650 mg by mouth every 6 (six) hours as needed for moderate pain.   blood glucose meter kit and supplies Kit Dispense based on patient and insurance preference. Check once daily. ICD doing E11.9.   CALCIUM 600/VITAMIN D PO Take 1 tablet by mouth daily.   cephALEXin 500 MG capsule Commonly known as: KEFLEX Take 1 capsule (500 mg total) by mouth 4 (four) times daily.   cyclobenzaprine 5 MG tablet Commonly known as: FLEXERIL TAKE 1 TABLET BY MOUTH EVERY 8 HOURS AS NEEDED FOR SPASMS.   dapagliflozin propanediol 10 MG Tabs tablet Commonly known as: Farxiga Take 1 tablet (10 mg total) by mouth daily before breakfast.   esomeprazole 40 MG capsule Commonly known as: NEXIUM TAKE 1 CAPSULE BY MOUTH ONCE DAILY   ezetimibe 10 MG tablet Commonly known as: ZETIA TAKE ONE TABLET BY MOUTH EVERY DAY   furosemide 20 MG tablet Commonly known as: LASIX TAKE ONE TABLET BY MOUTH EVERY DAY   gabapentin 300 MG capsule Commonly known as: NEURONTIN Take 1 capsule (300 mg total) by mouth 3 (three) times daily.   metoprolol succinate 50 MG 24 hr tablet Commonly known as: TOPROL-XL TAKE 1 TABLET BY MOUTH DAILY WITH OR IMMEDIATLY FOLLOWING A MEAL   metroNIDAZOLE 0.75 %  gel Commonly known as: METROGEL Apply 1 application topically 2 (two) times daily. For rosacea - apply to the cheeks, chin, and nose BID. What changed:  when to take this reasons to take this additional instructions   multivitamin capsule Take 1 capsule by mouth daily. With copper and zinc   nystatin-triamcinolone ointment Commonly known as: MYCOLOG Apply 1 application topically 2 (two) times daily. What changed:  when to take this reasons to take this   oxyCODONE-acetaminophen 5-325 MG tablet Commonly known as: PERCOCET/ROXICET Take 1-2  tablets by mouth every 4 (four) hours as needed for moderate pain.   primidone 50 MG tablet Commonly known as: MYSOLINE Take 1 tablet (50 mg total) by mouth at bedtime.   rosuvastatin 40 MG tablet Commonly known as: CRESTOR TAKE ONE TABLET EVERY DAY   sucralfate 1 g tablet Commonly known as: CARAFATE TAKE 1 TABLET BY MOUTH 4 TIMES DAILY   tamsulosin 0.4 MG Caps capsule Commonly known as: FLOMAX TAKE 1 CAPSULE BY MOUTH EVERY DAY   Tradjenta 5 MG Tabs tablet Generic drug: linagliptin TAKE 1 TABLET BY MOUTH DAILY   venlafaxine XR 75 MG 24 hr capsule Commonly known as: EFFEXOR-XR TAKE ONE CAPSULE EVERY MORNING WITH BREAKFAST         Signed: Cooper Render Unita Detamore 04/01/2022, 8:21 AM

## 2022-04-01 NOTE — Discharge Instructions (Signed)

## 2022-04-01 NOTE — Discharge Summary (Signed)
Physician Discharge Summary  Justin Patel IHW:[id number] DOB: 1950/09/01 DOA: 03/28/2022  PCP: [name], MD  Admit date: 03/28/2022 Discharge date: 04/01/2022  Admitted From: Home Disposition: Home  Recommendations for Outpatient Follow-up:  Follow up with PCP in 1-2 weeks Follow-up with neurosurgery as scheduled  Home Health: None Equipment/Devices: None  Discharge Condition: Stable CODE STATUS: Full Diet recommendation: As tolerated low-salt low-fat low-carb diet  Brief/Interim Summary: Justin Patel is a 71 y.o. male with medical history significant of hypertension, anxiety, depression, multiple back surgeries (most recently removal of bilateral T10 pedicle screws), diabetes, GERD, s/p gastric bypass surgery. Patient presented secondary to wound drainage and found to have a thoracic back infection. Empiric Vancomycin and Zosyn started. Neurosurgery consulted ad patient was taken to surgery on 8/20 for debridement.   Patient tolerated procedure quite well, infectious disease consulted as well as neurosurgery given need for surgical debridement as above.  Patient's cultures grew Staph aureus, patient has been de-escalated as below to cephalexin for the duration of his antibiotic course.  Other medication changes as below including Percocet per surgery.  Patient has had some postoperative constipation in the setting of narcotics, improving with MiraLAX, continue at home as discussed up to 3 times daily as indicated to continue soft bowel movements, if he has loose stool would recommend de-escalating back to his usual once a day dosing.  Patient's other complaints include nausea which of been moderately well controlled with Zofran.  At this time patient is otherwise stable and agreeable for discharge home, able to ambulate independently without any further deficits, pain well controlled on current regimen.  Patient's other chronic comorbid conditions at baseline.  Discharge  Diagnoses:  Principal Problem:   Wound infection after surgery Active Problems:   Diabetes ([place])   Essential hypertension, benign   Hyperlipidemia   BPH (benign prostatic hyperplasia)   Anxiety   Depression, major, single episode, mild [facility])    Discharge Instructions  Discharge Instructions     Incentive spirometry RT   Complete by: As directed       Allergies as of 04/01/2022       Reactions   Altace [ramipril] Anaphylaxis   Ace Inhibitors Hives   Levaquin [levofloxacin In D5w] Hives   Lyrica [pregabalin] Other (See Comments)   Edema   Metformin Diarrhea   High doses cause diarrhea.   Trulicity [dulaglutide] Nausea And Vomiting        Medication List     STOP taking these medications    traMADol 50 MG tablet Commonly known as: ULTRAM       TAKE these medications    acetaminophen 325 MG tablet Commonly known as: TYLENOL Take 650 mg by mouth every 6 (six) hours as needed for moderate pain.   blood glucose meter kit and supplies Kit Dispense based on patient and insurance preference. Check once daily. ICD doing E11.9.   CALCIUM 600/VITAMIN D PO Take 1 tablet by mouth daily.   cephALEXin 500 MG capsule Commonly known as: KEFLEX Take 1 capsule (500 mg total) by mouth 4 (four) times daily.   cyclobenzaprine 5 MG tablet Commonly known as: FLEXERIL TAKE 1 TABLET BY MOUTH EVERY 8 HOURS AS NEEDED FOR SPASMS.   dapagliflozin propanediol 10 MG Tabs tablet Commonly known as: Farxiga Take 1 tablet (10 mg total) by mouth daily before breakfast.   esomeprazole 40 MG capsule Commonly known as: NEXIUM TAKE 1 CAPSULE BY MOUTH ONCE DAILY   ezetimibe 10 MG tablet Commonly known  as: ZETIA TAKE ONE TABLET BY MOUTH EVERY DAY   furosemide 20 MG tablet Commonly known as: LASIX TAKE ONE TABLET BY MOUTH EVERY DAY   gabapentin 300 MG capsule Commonly known as: NEURONTIN Take 1 capsule (300 mg total) by mouth 3 (three) times daily.   metoprolol succinate 50  MG 24 hr tablet Commonly known as: TOPROL-XL TAKE 1 TABLET BY MOUTH DAILY WITH OR IMMEDIATLY FOLLOWING A MEAL   metroNIDAZOLE 0.75 % gel Commonly known as: METROGEL Apply 1 application topically 2 (two) times daily. For rosacea - apply to the cheeks, chin, and nose BID. What changed:  when to take this reasons to take this additional instructions   multivitamin capsule Take 1 capsule by mouth daily. With copper and zinc   nystatin-triamcinolone ointment Commonly known as: MYCOLOG Apply 1 application topically 2 (two) times daily. What changed:  when to take this reasons to take this   oxyCODONE-acetaminophen 5-325 MG tablet Commonly known as: PERCOCET/ROXICET Take 1-2 tablets by mouth every 4 (four) hours as needed for moderate pain.   polyethylene glycol 17 g packet Commonly known as: MIRALAX / GLYCOLAX Take 17 g by mouth 3 (three) times daily for 14 days.   primidone 50 MG tablet Commonly known as: MYSOLINE Take 1 tablet (50 mg total) by mouth at bedtime.   rosuvastatin 40 MG tablet Commonly known as: CRESTOR TAKE ONE TABLET EVERY DAY   sucralfate 1 g tablet Commonly known as: CARAFATE TAKE 1 TABLET BY MOUTH 4 TIMES DAILY   tamsulosin 0.4 MG Caps capsule Commonly known as: FLOMAX TAKE 1 CAPSULE BY MOUTH EVERY DAY   Tradjenta 5 MG Tabs tablet Generic drug: linagliptin TAKE 1 TABLET BY MOUTH DAILY   venlafaxine XR 75 MG 24 hr capsule Commonly known as: EFFEXOR-XR TAKE ONE CAPSULE EVERY MORNING WITH BREAKFAST        Allergies  Allergen Reactions   Altace [Ramipril] Anaphylaxis   Ace Inhibitors Hives   Levaquin [Levofloxacin In D5w] Hives   Lyrica [Pregabalin] Other (See Comments)    Edema   Metformin Diarrhea    High doses cause diarrhea.    Trulicity [Dulaglutide] Nausea And Vomiting    Consultations: Neuro surgery, ID   Procedures/Studies: MR THORACIC SPINE W WO CONTRAST  Result Date: 03/26/2022 CLINICAL DATA:  Drainage from incision from  spinal fusion in July, epidural abscess suspected EXAM: MRI THORACIC WITHOUT AND WITH CONTRAST TECHNIQUE: Multiplanar and multiecho pulse sequences of the thoracic spine were obtained without and with intravenous contrast. CONTRAST:  53m GADAVIST GADOBUTROL 1 MMOL/ML IV SOLN COMPARISON:  CT thoracic spine 01/22/2022 and CT T and L-spine 12/17/2021; no prior MRI of the thoracic spine. FINDINGS: Alignment: Exaggeration of the normal thoracic kyphosis superiorly. No significant listhesis in the thoracic spine. Vertebrae: Status post ACDF C3-C7 and posterior fusion T11 through the lumbar spine. Interval removal of previously noted screws at T10. Susceptibility artifact from hardware limits evaluation. Increased T2 signal and enhancement are seen along the screw tracts and in the endplates about the TX5-M84disc space. No abnormal signal within the disc itself. Redemonstrated compression deformity of L1, which appears unchanged compared to 12/17/2021 otherwise normal marrow signal. Cord: Normal signal and morphology. No abnormal enhancement. No evidence of epidural collection. Paraspinal and other soft tissues: Postsurgical changes in the back, with 2 fluid collections that demonstrate surrounding enhancement, which appear to connect to the skin surface (series 20, image 34 and series 23, image 24). The right collection measures up to 0.8 x 2.3  x 4.0 cm (series 20, image 35 and series 24, image 5). The left collection is not imaged completely on the sagittal sequences but measures up to 1.6 x 2.4 cm in the axial plane (series 20, image 35). Foci of air are noted within the collections, concerning for gas-forming organisms. These collections do not appear to extend to the thecal sac. Disc levels: No high-grade spinal canal stenosis or neural foraminal narrowing. IMPRESSION: 1. Postsurgical changes in the back, with 2 fluid collections with surrounding enhancement, which appear to connect to the skin surface and contain  foci of air, concerning for abscess with gas-forming organisms. No evidence of a connection between the collections and the spinal canal. No epidural abscess. 2. T2 hyperintense signal and enhancement along the screw tracts at T10 is favored to reflect edema related to recent screw removal rather than infection, although infection could appear similar. No abnormal signal in the T9-T10 disc. Electronically Signed   By: Merilyn Baba M.D.   On: 03/26/2022 23:28   DG Thoracic Spine 2 View  Result Date: 03/26/2022 CLINICAL DATA:  Pain, open wound in the back, previous surgical fusion EXAM: THORACIC SPINE 2 VIEWS COMPARISON:  Radiographs done on 09/10/2020 and CT done on 01/22/2022 FINDINGS: No recent fracture is seen. There is surgical fusion extending from T11 through the lumbar spine. Lower extent of fusion is not included. There is a anterior surgical fusion in cervical spine from C3-C7 levels. Degenerative changes are noted with bony spurs. There are surgical clips and staples in epigastrium. Old healed fractures are seen in multiple left ribs. There is interval removal of surgical hardware from T10 vertebra. IMPRESSION: No recent fracture is seen. Degenerative changes are noted. There is evidence of surgical fusion in cervical spine, lower thoracic spine and lumbar spine. Electronically Signed   By: Elmer Picker M.D.   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   Discharge Exam: Vitals:   04/01/22 0408 04/01/22 0807  BP: (!) 170/90 (!) 156/92  Pulse: (!) 55 (!) 56  Resp: 16 18  Temp: 97.8 F (36.6 C) 98 F (36.7 C)  SpO2: 98% 93%   Vitals:   03/31/22 1942 03/31/22 2109 04/01/22 0408 04/01/22 0807  BP:  (!) 180/92 (!) 170/90 (!) 156/92  Pulse:  (!) 55 (!) 55 (!) 56   Resp:  _0 Temp:  97.9 F (36.6 C) 97.8 F (36.6 C) 98 F (36.7 C)  TempSrc:  Oral Oral Oral  SpO2:  100% 98% 93%  Weight: 81.3 kg     Height: _1  (1.651 m)       General: Justin Patel is alert, awake, not in acute distress Cardiovascular: RRR, S1/S2 +, no rubs, no gallops Respiratory: CTA bilaterally, no wheezing, no rhonchi Abdominal: Soft, NT, ND, bowel sounds + Extremities: no edema, no cyanosis Skin: Postoperative thoracic wound/bandage clean dry intact.   The results of significant diagnostics from this hospitalization (including imaging, microbiology, ancillary and laboratory) are listed below for reference.     Microbiology: Recent Results (from the past 240 hour(s))  Blood culture (routine x 2)     Status: None   Collection Time: 03/26/22  6:10 PM   Specimen: BLOOD  Result Value Ref Range Status   Specimen Description BLOOD RIGTH [facility]  Final  Special Requests   Final    BOTTLES DRAWN AEROBIC AND ANAEROBIC Blood Culture results may not be optimal due to an excessive volume of blood received in culture bottles   Culture   Final    NO GROWTH 5 DAYS Performed at [facility], [address] [place], [place] [address]    Report Status 03/31/2022 FINAL  Final  Blood culture (routine x 2)     Status: None   Collection Time: 03/26/22  6:10 PM   Specimen: BLOOD  Result Value Ref Range Status   Specimen Description BLOOD LEFT FA  Final   Special Requests   Final    BOTTLES DRAWN AEROBIC AND ANAEROBIC Blood Culture results may not be optimal due to an inadequate volume of blood received in culture bottles   Culture   Final    NO GROWTH 5 DAYS Performed at [facility], [address], [place], [place] [address]    Report Status 03/31/2022 FINAL  Final  Aerobic/Anaerobic Culture w Gram Stain (surgical/deep wound)     Status: None (Preliminary result)   Collection Time: 03/28/22  8:32 PM   Specimen: PATH Other; Tissue  Result Value Ref Range Status    Specimen Description WOUND  Final   Special Requests LEFT THORACIC SURGICAL SPECIMEN A  Final   Gram Stain   Final    MODERATE WBC PRESENT,BOTH PMN AND MONONUCLEAR FEW GRAM POSITIVE COCCI IN PAIRS Performed at [facility], [place] [address] [place], [place] [address]    Culture   Final    RARE STAPHYLOCOCCUS AUREUS NO ANAEROBES ISOLATED; CULTURE IN PROGRESS FOR 5 DAYS    Report Status PENDING  Incomplete   Organism ID, Bacteria STAPHYLOCOCCUS AUREUS  Final      Susceptibility   Staphylococcus aureus - MIC*    CIPROFLOXACIN <=0.5 SENSITIVE Sensitive     ERYTHROMYCIN >=8 RESISTANT Resistant     GENTAMICIN <=0.5 SENSITIVE Sensitive     OXACILLIN 0.5 SENSITIVE Sensitive     TETRACYCLINE <=1 SENSITIVE Sensitive     VANCOMYCIN <=0.5 SENSITIVE Sensitive     TRIMETH/SULFA <=10 SENSITIVE Sensitive     CLINDAMYCIN <=0.25 SENSITIVE Sensitive     RIFAMPIN <=0.5 SENSITIVE Sensitive     Inducible Clindamycin NEGATIVE Sensitive     * RARE STAPHYLOCOCCUS AUREUS  Aerobic/Anaerobic Culture w Gram Stain (surgical/deep wound)     Status: None (Preliminary result)   Collection Time: 03/28/22  8:38 PM   Specimen: PATH Other; Tissue  Result Value Ref Range Status   Specimen Description WOUND  Final   Special Requests RIGHT THORACIC SURGICAL WOUND SPECIMEN B  Final   Gram Stain PENDING  Incomplete   Culture   Final    FEW STAPHYLOCOCCUS AUREUS SUSCEPTIBILITIES TO FOLLOW Performed at [facility], [place] [address], [place], [place] [address]    Report Status PENDING  Incomplete     Labs: BNP (last 3 results) No results for input(s): "BNP" in the last 8760 hours. Basic Metabolic Panel: Recent Labs  Lab 03/26/22 1051 03/27/22 1338 03/28/22 0631 03/29/22 0140 03/30/22 0132  NA 138 136 137 138 138  K 3.5 3.9 3.9 3.7 3.7  CL 105 103 109 104 107  CO2 _0 GLUCOSE 149* 124* 110* 131* 111*  BUN _1 CREATININE 1.13 0.85 0.79 1.10 1.07  CALCIUM 9.3 8.8* 8.3*  8.5* 8.1*   Liver Function Tests: Recent Labs  Lab 03/26/22  1051 03/29/22 0140  AST 22 24  ALT 20 21  ALKPHOS 60 50  BILITOT 0.5 0.5  PROT 7.4 5.4*  ALBUMIN 3.4* 2.4*   No results for input(s): "LIPASE", "AMYLASE" in the last 168 hours. No results for input(s): "AMMONIA" in the last 168 hours. CBC: Recent Labs  Lab 03/26/22 1051 03/27/22 1338 03/29/22 0140 03/30/22 0132  WBC 9.4  9.3 8.5 7.3 7.4  NEUTROABS 6.8 6.3  --   --   HGB 12.5*  12.5* 11.3* 10.6* 10.3*  HCT 40.8  40.9 36.3* 33.5* 32.5*  MCV 98.6  96.7 97.6 97.4 97.0  PLT 383  370 315 262 263   Cardiac Enzymes: No results for input(s): "CKTOTAL", "CKMB", "CKMBINDEX", "TROPONINI" in the last 168 hours. BNP: Invalid input(s): "POCBNP" CBG: Recent Labs  Lab 03/31/22 0814 03/31/22 1135 03/31/22 1654 03/31/22 2110 04/01/22 0806  GLUCAP 106* 124* 115* 111* 126*   D-Dimer No results for input(s): "DDIMER" in the last 72 hours. Hgb A1c No results for input(s): "HGBA1C" in the last 72 hours. Lipid Profile No results for input(s): "CHOL", "HDL", "LDLCALC", "TRIG", "CHOLHDL", "LDLDIRECT" in the last 72 hours. Thyroid function studies No results for input(s): "TSH", "T4TOTAL", "T3FREE", "THYROIDAB" in the last 72 hours.  Invalid input(s): "FREET3" Anemia work up No results for input(s): "VITAMINB12", "FOLATE", "FERRITIN", "TIBC", "IRON", "RETICCTPCT" in the last 72 hours. Urinalysis    Component Value Date/Time   COLORURINE YELLOW (A) 01/28/2020 1922   APPEARANCEUR HAZY (A) 01/28/2020 1922   APPEARANCEUR Clear 05/01/2014 0928   LABSPEC 1.026 01/28/2020 1922   LABSPEC 1.031 05/01/2014 0928   PHURINE 5.0 01/28/2020 1922   GLUCOSEU NEGATIVE 01/28/2020 1922   GLUCOSEU Negative 05/01/2014 0928   HGBUR NEGATIVE 01/28/2020 1922   BILIRUBINUR neg 01/11/2022 1147   BILIRUBINUR Negative 05/01/2014 0928   KETONESUR NEGATIVE 01/28/2020 1922   PROTEINUR Positive (A) 01/11/2022 1147   PROTEINUR 30 (A)  01/28/2020 1922   UROBILINOGEN 0.2 01/11/2022 1147   NITRITE neg 01/11/2022 1147   NITRITE NEGATIVE 01/28/2020 1922   LEUKOCYTESUR Negative 01/11/2022 1147   LEUKOCYTESUR NEGATIVE 01/28/2020 1922   LEUKOCYTESUR Negative 05/01/2014 0928   Sepsis Labs Recent Labs  Lab 03/26/22 1051 03/27/22 1338 03/29/22 0140 03/30/22 0132  WBC 9.4  9.3 8.5 7.3 7.4   Microbiology Recent Results (from the past 240 hour(s))  Blood culture (routine x 2)     Status: None   Collection Time: 03/26/22  6:10 PM   Specimen: BLOOD  Result Value Ref Range Status   Specimen Description BLOOD RIGTH [facility]  Final   Special Requests   Final    BOTTLES DRAWN AEROBIC AND ANAEROBIC Blood Culture results may not be optimal due to an excessive volume of blood received in culture bottles   Culture   Final    NO GROWTH 5 DAYS Performed at [name], [address] [place], [place] [address]    Report Status 03/31/2022 FINAL  Final  Blood culture (routine x 2)     Status: None   Collection Time: 03/26/22  6:10 PM   Specimen: BLOOD  Result Value Ref Range Status   Specimen Description BLOOD LEFT FA  Final   Special Requests   Final    BOTTLES DRAWN AEROBIC AND ANAEROBIC Blood Culture results may not be optimal due to an inadequate volume of blood received in culture bottles   Culture   Final    NO GROWTH 5 DAYS Performed at [facility], [place]  [address] [place], [place] [address]    Report Status 03/31/2022 FINAL  Final  Aerobic/Anaerobic Culture w Gram Stain (surgical/deep wound)     Status: None (Preliminary result)   Collection Time: 03/28/22  8:32 PM   Specimen: PATH Other; Tissue  Result Value Ref Range Status   Specimen Description WOUND  Final   Special Requests LEFT THORACIC SURGICAL SPECIMEN A  Final   Gram Stain   Final    MODERATE WBC PRESENT,BOTH PMN AND MONONUCLEAR FEW GRAM POSITIVE COCCI IN PAIRS Performed at [facility], [place] [address], [place], [place] [address]     Culture   Final    RARE STAPHYLOCOCCUS AUREUS NO ANAEROBES ISOLATED; CULTURE IN PROGRESS FOR 5 DAYS    Report Status PENDING  Incomplete   Organism ID, Bacteria STAPHYLOCOCCUS AUREUS  Final      Susceptibility   Staphylococcus aureus - MIC*    CIPROFLOXACIN <=0.5 SENSITIVE Sensitive     ERYTHROMYCIN >=8 RESISTANT Resistant     GENTAMICIN <=0.5 SENSITIVE Sensitive     OXACILLIN 0.5 SENSITIVE Sensitive     TETRACYCLINE <=1 SENSITIVE Sensitive     VANCOMYCIN <=0.5 SENSITIVE Sensitive     TRIMETH/SULFA <=10 SENSITIVE Sensitive     CLINDAMYCIN <=0.25 SENSITIVE Sensitive     RIFAMPIN <=0.5 SENSITIVE Sensitive     Inducible Clindamycin NEGATIVE Sensitive     * RARE STAPHYLOCOCCUS AUREUS  Aerobic/Anaerobic Culture w Gram Stain (surgical/deep wound)     Status: None (Preliminary result)   Collection Time: 03/28/22  8:38 PM   Specimen: PATH Other; Tissue  Result Value Ref Range Status   Specimen Description WOUND  Final   Special Requests RIGHT THORACIC SURGICAL WOUND SPECIMEN B  Final   Gram Stain PENDING  Incomplete   Culture   Final    FEW STAPHYLOCOCCUS AUREUS SUSCEPTIBILITIES TO FOLLOW Performed at [facility], [place] [address] [place], [place] [address]    Report Status PENDING  Incomplete     Time coordinating discharge: Over 30 minutes  SIGNED:   Little Ishikawa, DO [facility] 04/01/2022, 8:47 AM Pager   If 7PM-7AM, please contact night-coverage [email or web address]

## 2022-04-02 ENCOUNTER — Telehealth: Payer: Self-pay

## 2022-04-02 LAB — AEROBIC/ANAEROBIC CULTURE W GRAM STAIN (SURGICAL/DEEP WOUND)

## 2022-04-02 NOTE — Telephone Encounter (Signed)
Transition Care Management Unsuccessful Follow-up Telephone Call  Date of discharge and from where:  04/01/22 -[facility]  Attempts:  1st Attempt  Reason for unsuccessful TCM follow-up call:  Unable to reach patient. Will follow.     

## 2022-04-02 NOTE — Progress Notes (Addendum)
ID Brief Note     Component 5 d ago  Specimen Description WOUND   Special Requests LEFT THORACIC SURGICAL SPECIMEN A   Gram Stain MODERATE WBC PRESENT,BOTH PMN AND MONONUCLEAR  FEW GRAM POSITIVE COCCI IN PAIRS   Culture RARE STAPHYLOCOCCUS AUREUS  NO ANAEROBES ISOLATED  Performed at [facility], [place] [address], [place], [place] [address]   Report Status 04/02/2022 FINAL   Organism ID, Bacteria STAPHYLOCOCCUS AUREUS   Resulting Agency [facility]     Susceptibility   Staphylococcus aureus    MIC    CIPROFLOXACIN <=0.5 SENSI... Sensitive    CLINDAMYCIN <=0.25 SENS... Sensitive    ERYTHROMYCIN >=8 RESISTANT  Resistant    GENTAMICIN <=0.5 SENSI... Sensitive    Inducible Clindamycin NEGATIVE  Sensitive    OXACILLIN 0.5 SENSITIVE  Sensitive    RIFAMPIN <=0.5 SENSI... Sensitive    TETRACYCLINE <=1 SENSITIVE  Sensitive    TRIMETH/SULFA <=10 SENSIT... Sensitive    VANCOMYCIN <=0.5 SENSI... Sensitive           Susceptibility Comments  Staphylococcus aureus  RARE STAPHYLOCOCCUS AUREUS

## 2022-04-05 ENCOUNTER — Other Ambulatory Visit: Payer: Self-pay | Admitting: Family Medicine

## 2022-04-05 NOTE — Telephone Encounter (Signed)
Transition Care Management Follow-up Telephone Call Date of discharge and from where: 04/01/22 -[place] How have you been since you were released from the hospital? Patient states, "Pain controlled with oxycodone and tylenol. I am doing fine." Denies n/v/d, fever, and all other harmful symptoms. Incentive spirometry in use as directed.  Any questions or concerns? No  Items Reviewed: Did the pt receive and understand the discharge instructions provided? Yes  Medications obtained and verified? Yes  Any new allergies since your discharge? No  Dietary orders reviewed? Yes Do you have support at home? Yes   Home Care and Equipment/Supplies: Were home health services ordered? no  Functional Questionnaire: (I = Independent and D = Dependent) ADLs: I  Bathing/Dressing- I  Meal Prep- I  Eating- I  Maintaining continence- I  Transferring/Ambulation- I  Managing Meds- I  Follow up appointments reviewed:  PCP Hospital f/u appt confirmed? Yes  Scheduled to see PCP on 04/23/22 @ 11:00. Patient has conflicting appointments during opening slots on 9/12 and 9/13. Okay to come sooner if other available openings. None at this time.  [facility] f/u appt confirmed? Yes  Scheduled to see [facility] on 04/08/22. A Are transportation arrangements needed? No  If their condition worsens, is the pt aware to call PCP or go to the Emergency Dept.? Yes Was the patient provided with contact information for the PCP's office or ED? Yes Was to pt encouraged to call back with questions or concerns? Yes

## 2022-04-06 ENCOUNTER — Inpatient Hospital Stay: Payer: Medicare PPO

## 2022-04-06 VITALS — BP 151/90 | HR 79 | Temp 97.3°F | Resp 18

## 2022-04-06 DIAGNOSIS — I509 Heart failure, unspecified: Secondary | ICD-10-CM | POA: Diagnosis not present

## 2022-04-06 DIAGNOSIS — E611 Iron deficiency: Secondary | ICD-10-CM | POA: Diagnosis not present

## 2022-04-06 DIAGNOSIS — I130 Hypertensive heart and chronic kidney disease with heart failure and stage 1 through stage 4 chronic kidney disease, or unspecified chronic kidney disease: Secondary | ICD-10-CM | POA: Diagnosis not present

## 2022-04-06 DIAGNOSIS — G8929 Other chronic pain: Secondary | ICD-10-CM | POA: Diagnosis not present

## 2022-04-06 DIAGNOSIS — D649 Anemia, unspecified: Secondary | ICD-10-CM | POA: Diagnosis not present

## 2022-04-06 DIAGNOSIS — N189 Chronic kidney disease, unspecified: Secondary | ICD-10-CM | POA: Diagnosis not present

## 2022-04-06 DIAGNOSIS — R5382 Chronic fatigue, unspecified: Secondary | ICD-10-CM | POA: Diagnosis not present

## 2022-04-06 DIAGNOSIS — E1122 Type 2 diabetes mellitus with diabetic chronic kidney disease: Secondary | ICD-10-CM | POA: Diagnosis not present

## 2022-04-06 MED ORDER — SODIUM CHLORIDE 0.9 % IV SOLN
Freq: Once | INTRAVENOUS | Status: AC
  Filled 2022-04-06: qty 250

## 2022-04-06 MED ORDER — SODIUM CHLORIDE 0.9 % IV SOLN
200.0000 mg | Freq: Once | INTRAVENOUS | Status: AC
  Administered 2022-04-06: 200 mg via INTRAVENOUS
  Filled 2022-04-06: qty 200

## 2022-04-06 NOTE — Patient Instructions (Signed)
[facility]  Discharge Instructions: Thank you for choosing [facility] to provide your oncology and hematology care.  If you have a lab appointment with the Cancer Center, please go directly to the Cancer Center and check in at the registration area.  Wear comfortable clothing and clothing appropriate for easy access to any Portacath or PICC line.   We strive to give you quality time with your provider. You may need to reschedule your appointment if you arrive late (15 or more minutes).  Arriving late affects you and other patients whose appointments are after yours.  Also, if you miss three or more appointments without notifying the office, you may be dismissed from the clinic at the provider's discretion.      For prescription refill requests, have your pharmacy contact our office and allow 72 hours for refills to be completed.    Today you received the following chemotherapy and/or immunotherapy agents VENOFER      To help prevent nausea and vomiting after your treatment, we encourage you to take your nausea medication as directed.  BELOW ARE SYMPTOMS THAT SHOULD BE REPORTED IMMEDIATELY: *FEVER GREATER THAN 100.4 F (38 C) OR HIGHER *CHILLS OR SWEATING *NAUSEA AND VOMITING THAT IS NOT CONTROLLED WITH YOUR NAUSEA MEDICATION *UNUSUAL SHORTNESS OF BREATH *UNUSUAL BRUISING OR BLEEDING *URINARY PROBLEMS (pain or burning when urinating, or frequent urination) *BOWEL PROBLEMS (unusual diarrhea, constipation, pain near the anus) TENDERNESS IN MOUTH AND THROAT WITH OR WITHOUT PRESENCE OF ULCERS (sore throat, sores in mouth, or a toothache) UNUSUAL RASH, SWELLING OR PAIN  UNUSUAL VAGINAL DISCHARGE OR ITCHING   Items with * indicate a potential emergency and should be followed up as soon as possible or go to the Emergency Department if any problems should occur.  Please show the CHEMOTHERAPY ALERT CARD or IMMUNOTHERAPY ALERT CARD at check-in to the  Emergency Department and triage nurse.  Should you have questions after your visit or need to cancel or reschedule your appointment, please contact [facility]  [phone] and follow the prompts.  Office hours are 8:00 a.m. to 4:30 p.m. Monday - Friday. Please note that voicemails left after 4:00 p.m. may not be returned until the following business day.  We are closed weekends and major holidays. You have access to a nurse at all times for urgent questions. Please call the main number to the clinic [phone] and follow the prompts.  For any non-urgent questions, you may also contact your provider using MyChart. We now offer e-Visits for anyone 18 and older to request care online for non-urgent symptoms. For details visit [email or web address].   Also download the MyChart app! Go to the app store, search "MyChart", open the app, select [facility], and log in with your MyChart username and password.  Masks are optional in the cancer centers. If you would like for your care team to wear a mask while they are taking care of you, please let them know. For doctor visits, patients may have with them one support person who is at least 71 years old. At this time, visitors are not allowed in the infusion area.  Iron Sucrose Injection What is this medication? IRON SUCROSE (EYE ern SOO krose) treats low levels of iron (iron deficiency anemia) in people with kidney disease. Iron is a mineral that plays an important role in making red blood cells, which carry oxygen from your lungs to the rest of your body. This medicine may be   used for other purposes; ask your health care provider or pharmacist if you have questions. COMMON BRAND NAME(S): Venofer What should I tell my care team before I take this medication? They need to know if you have any of these conditions: Anemia not caused by low iron levels Heart disease High levels of iron in the blood Kidney disease Liver  disease An unusual or allergic reaction to iron, other medications, foods, dyes, or preservatives Pregnant or trying to get pregnant Breast-feeding How should I use this medication? This medication is for infusion into a vein. It is given in a hospital or clinic setting. Talk to your care team about the use of this medication in children. While this medication may be prescribed for children as young as 2 years for selected conditions, precautions do apply. Overdosage: If you think you have taken too much of this medicine contact a poison control center or emergency room at once. NOTE: This medicine is only for you. Do not share this medicine with others. What if I miss a dose? It is important not to miss your dose. Call your care team if you are unable to keep an appointment. What may interact with this medication? Do not take this medication with any of the following: Deferoxamine Dimercaprol Other iron products This medication may also interact with the following: Chloramphenicol Deferasirox This list may not describe all possible interactions. Give your health care provider a list of all the medicines, herbs, non-prescription drugs, or dietary supplements you use. Also tell them if you smoke, drink alcohol, or use illegal drugs. Some items may interact with your medicine. What should I watch for while using this medication? Visit your care team regularly. Tell your care team if your symptoms do not start to get better or if they get worse. You may need blood work done while you are taking this medication. You may need to follow a special diet. Talk to your care team. Foods that contain iron include: whole grains/cereals, dried fruits, beans, or peas, leafy green vegetables, and organ meats (liver, kidney). What side effects may I notice from receiving this medication? Side effects that you should report to your care team as soon as possible: Allergic reactions--skin rash, itching, hives,  swelling of the face, lips, tongue, or throat Low blood pressure--dizziness, feeling faint or lightheaded, blurry vision Shortness of breath Side effects that usually do not require medical attention (report to your care team if they continue or are bothersome): Flushing Headache Joint pain Muscle pain Nausea Pain, redness, or irritation at injection site This list may not describe all possible side effects. Call your doctor for medical advice about side effects. You may report side effects to FDA at 1-800-FDA-1088. Where should I keep my medication? This medication is given in a hospital or clinic and will not be stored at home. NOTE: This sheet is a summary. It may not cover all possible information. If you have questions about this medicine, talk to your doctor, pharmacist, or health care provider.  2023 Elsevier/Gold Standard (2007-09-16 00:00:00)   

## 2022-04-06 NOTE — Telephone Encounter (Signed)
Reviewed

## 2022-04-07 ENCOUNTER — Other Ambulatory Visit: Payer: Self-pay

## 2022-04-07 ENCOUNTER — Telehealth: Payer: Self-pay | Admitting: Pharmacy Technician

## 2022-04-07 DIAGNOSIS — Z596 Low income: Secondary | ICD-10-CM

## 2022-04-07 NOTE — Progress Notes (Signed)
[place] [facility])                                            [place] Team    04/07/2022  Justin Patel [birth year] [id number]  Successful outreach call placed to patient in regard to [place] application for [place] and [employer] delivery of Farxiga.  Spoke to patient, HIPAA verified.  Patient informs he received 90 day supply of [place]. Patient was informed [employer] auto ships but to call them if down to 10 days supply and no shipment has arrived. Patient verbalized understanding.  Patient informs he has not been able to locate the [facility] application for [place]. Therefore, another application will be mailed to patient.  Justin Patel P. Justin Patel, [place]  [phone]

## 2022-04-07 NOTE — Patient Outreach (Signed)
[facility]) Care Management  04/07/2022  Justin Patel 01/12/1951 [id number]   Telephone Screen    Outreach call to patient to introduce [facility] services and assess care needs as part of benefit of PCP office and insurance plan. Spoke with patient. He denies any RN CM needs or concerns at this time or need for follow up. He was appreciative of call.    Main healthcare issue/concern today: Patient recovering from recent back surgery. He goes to see surgeon on tomorrow-plans to discuss pain mgmt. He is taking Tylenol as he is out of Oxycodone that was prescribed at time of discharge. Patient reports he is ambulating-getting around well. He has supportive spouse in the home. He is independent with ADLs/IADLs. except he has not been cleared to resume driving. Spouse taking him to appts. Denies any issues with transportation and/or affording meds.    Health Maintenance/Care Gaps Addressed & Education Provided:   -Last AWV: 06/22/21-Patient aware and will make an appt.  -Flu Vaccine-last taken in Aug 2022-patient has PCP appt in [place] and will get vaccine      Enzo Montgomery, RN,BSN,CCM [place] Management Telephonic Care Management Coordinator Direct Phone: [phone] Toll Free: [phone] Fax: [phone]

## 2022-04-09 ENCOUNTER — Other Ambulatory Visit: Payer: Self-pay | Admitting: Family Medicine

## 2022-04-09 DIAGNOSIS — E785 Hyperlipidemia, unspecified: Secondary | ICD-10-CM

## 2022-04-13 ENCOUNTER — Inpatient Hospital Stay: Payer: Medicare PPO | Admitting: Infectious Diseases

## 2022-04-13 ENCOUNTER — Telehealth: Payer: Self-pay | Admitting: Pharmacy Technician

## 2022-04-13 DIAGNOSIS — Z596 Low income: Secondary | ICD-10-CM

## 2022-04-13 NOTE — Progress Notes (Signed)
[facility])                                            [place] Team    04/13/2022  Justin Patel [birth year] [id number]  Successful outreach call placed to patient about [employer] application for [place].  HIPAA verified.  Patient unable to locate BI application and requests another application be mailed to him. Application placed in mail today.  Aailyah Dunbar P. Jushua Waltman, [place]  [phone]

## 2022-04-20 ENCOUNTER — Other Ambulatory Visit: Payer: Self-pay

## 2022-04-20 ENCOUNTER — Ambulatory Visit: Payer: Medicare PPO | Admitting: Infectious Diseases

## 2022-04-20 ENCOUNTER — Encounter: Payer: Self-pay | Admitting: Infectious Diseases

## 2022-04-20 VITALS — BP 143/95 | HR 66 | Resp 16 | Ht 65.0 in | Wt 167.2 lb

## 2022-04-20 DIAGNOSIS — Z5181 Encounter for therapeutic drug level monitoring: Secondary | ICD-10-CM | POA: Insufficient documentation

## 2022-04-20 DIAGNOSIS — T8149XA Infection following a procedure, other surgical site, initial encounter: Secondary | ICD-10-CM

## 2022-04-20 NOTE — Progress Notes (Unsigned)
Patient Active Problem List   Diagnosis Date Noted   Wound infection after surgery 03/28/2022   Lumbar pseudoarthrosis 03/08/2022   Foot fracture 02/02/2022   Thoracic radiculopathy 02/01/2022   Iron deficiency 01/19/2022   History of fusion of thoracic spine 12/28/2021   Pain in thoracic spine 12/28/2021   Chronic back pain 09/25/2020   Aortic atherosclerosis ([place]) 05/20/2020   Common bile duct dilatation 05/20/2020   S/P laparoscopic cholecystectomy 02/18/2020   History of gastric bypass    Fracture of L1 vertebra ([place]) 01/14/2020   Itchy skin 12/04/2019   Arthralgia 08/17/2019   Mass of submandibular region 08/17/2019   Pain of right thumb 08/17/2019   Numbness of face 05/22/2019   Chronic night sweats 03/21/2019   Chronic abdominal pain 02/27/2019   Weakness of both lower limbs 08/31/2018   Muscle weakness 08/24/2018   Dyspnea on exertion 08/24/2018   Depression, major, single episode, mild (HCC) 08/24/2018   Macrocytosis without anemia 07/05/2018   RLQ abdominal pain 05/18/2018   CKD (chronic kidney disease) stage 3, GFR 30-59 ml/min 05/08/2018   Lumbar scoliosis 04/25/2018   Easy bruising 12/09/2017   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   Abnormal weight loss 10/18/2017   GERD (gastroesophageal reflux disease) 10/18/2017   Constipation 10/18/2017   Osteoporosis without current pathological fracture 07/20/2017   Vitamin D deficiency 07/20/2017   Osteopenia determined by x-ray 04/25/2017   Rash 03/11/2017   Seborrheic dermatitis 02/24/2017   Intertrigo 02/24/2017   Tachycardia 09/27/2016   Anxiety 06/25/2016   Cervical facet syndrome 03/15/2016   Facet syndrome, lumbar 03/15/2016   DDD (degenerative disc disease), cervical 03/15/2016   DDD (degenerative disc disease), lumbar 03/15/2016   Other iron deficiency anemias 11/27/2015   Recurrent falls 07/25/2015   Chronic pain syndrome 04/16/2015   Alcohol abuse 10/01/2014   Syncope 06/03/2014   H/O  total knee replacement 05/28/2014   BPH (benign prostatic hyperplasia) 11/16/2013   Enlarged prostate 11/08/2013   Anemia 11/08/2013   H/O urinary stone 10/05/2013   Renal cyst 08/27/2013   Diabetes ([place]) 03/21/2013   Essential hypertension, benign 03/21/2013   Hyperlipidemia 03/21/2013   Overweight (BMI 25.0-29.9) 03/21/2013    Patient's Medications  New Prescriptions   No medications on file  Previous Medications   ACETAMINOPHEN (TYLENOL) 325 MG TABLET    Take 650 mg by mouth every 6 (six) hours as needed for moderate pain.   BLOOD GLUCOSE METER KIT AND SUPPLIES KIT    Dispense based on patient and insurance preference. Check once daily. ICD doing E11.9.   CALCIUM CARBONATE-VITAMIN D (CALCIUM 600/VITAMIN D PO)    Take 1 tablet by mouth daily.   CEPHALEXIN (KEFLEX) 500 MG CAPSULE    Take 1 capsule (500 mg total) by mouth 4 (four) times daily.   CYCLOBENZAPRINE (FLEXERIL) 5 MG TABLET    TAKE 1 TABLET BY MOUTH EVERY 8 HOURS AS NEEDED FOR SPASMS.   DAPAGLIFLOZIN PROPANEDIOL (FARXIGA) 10 MG TABS TABLET    Take 1 tablet (10 mg total) by mouth daily before breakfast.   ESOMEPRAZOLE (NEXIUM) 40 MG CAPSULE    TAKE 1 CAPSULE BY MOUTH ONCE DAILY   EZETIMIBE (ZETIA) 10 MG TABLET    TAKE ONE TABLET BY MOUTH EVERY DAY   FUROSEMIDE (LASIX) 20 MG TABLET    TAKE ONE TABLET BY MOUTH EVERY DAY   GABAPENTIN (NEURONTIN) 300 MG CAPSULE    Take 1 capsule (300 mg total) by mouth 3 (three) times daily.   METOPROLOL  SUCCINATE (TOPROL-XL) 50 MG 24 HR TABLET    TAKE 1 TABLET BY MOUTH DAILY WITH OR IMMEDIATLY FOLLOWING A MEAL   METRONIDAZOLE (METROGEL) 0.75 % GEL    Apply 1 application topically 2 (two) times daily. For rosacea - apply to the cheeks, chin, and nose BID.   MULTIPLE VITAMIN (MULTIVITAMIN) CAPSULE    Take 1 capsule by mouth daily. With copper and zinc   NYSTATIN-TRIAMCINOLONE OINTMENT (MYCOLOG)    Apply 1 application topically 2 (two) times daily.   ONDANSETRON (ZOFRAN) 4 MG TABLET    Take 1 tablet  (4 mg total) by mouth daily as needed for nausea or vomiting.   OXYCODONE-ACETAMINOPHEN (PERCOCET/ROXICET) 5-325 MG TABLET    Take 1-2 tablets by mouth every 4 (four) hours as needed for moderate pain.   PRIMIDONE (MYSOLINE) 50 MG TABLET    Take 1 tablet (50 mg total) by mouth at bedtime.   ROSUVASTATIN (CRESTOR) 40 MG TABLET    TAKE ONE TABLET EVERY DAY   SUCRALFATE (CARAFATE) 1 G TABLET    TAKE 1 TABLET BY MOUTH 4 TIMES DAILY   TAMSULOSIN (FLOMAX) 0.4 MG CAPS CAPSULE    TAKE 1 CAPSULE BY MOUTH EVERY DAY   TRADJENTA 5 MG TABS TABLET    TAKE 1 TABLET BY MOUTH DAILY   VENLAFAXINE XR (EFFEXOR-XR) 75 MG 24 HR CAPSULE    TAKE ONE CAPSULE EVERY MORNING WITH BREAKFAST  Modified Medications   No medications on file  Discontinued Medications   No medications on file    Subjective: 71 Y O male with PMH as below including anterior cervical c3-c7  decompression/discectomy fusion 2013, Rt anterolateral lumbar L3-L5  interbody Instrumented fusion 2019 ,decompressive Lumbar laminectomy with pedicle screw fixation from Thoracic Ten to Lumbar Two with posterolateral arthrodesis 2021 followed by removal of bilateral thoracic ten pedicle screws 02/2022 who is here for HFU for superficial surgical site infection in the setting of recent thoracic surgery. 8/20 s/p bilateral thoracic wound irrigation and debridement with primary closure. OR cx growing staph aureus. Patient was discharged on 8/24 with po cephalexin.   Today's Visit He has been taking cephalexin as prescribed without missing doses. Denies any concerns with the antibiotics. Denies any fevers, chills, or drainage from the posterior back wound. However, he feels tired and has low energy. Denies nausea, vomiting, abdominal pain and diarrhea. Back pain is 7-8/10 on most days and takes 2 percocet every 4 hrs and 2 tramadol three times a day. He has a fu with Dr Trenton Gammon next week.  No symptoms otherwise.   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  removal scheduled to clear margins.   CHF (congestive heart failure) (HCC)    no longer after weight loss   Chronic kidney disease    cysts   Degenerative disc disease    Depression    Diabetes mellitus    no longer diabetic-or on meds   DJD (degenerative joint disease)    Dysplastic nevus 02/04/2016   R upper back paraspinal - severe   Family history of adverse reaction to anesthesia    sons wake up combative   Foot fracture, left 01/2022   Gastric ulcer    GERD (gastroesophageal  reflux disease)    H/O hiatal hernia    Headache(784.0)    sinus   Heart murmur    Hx of congestive heart failure    Hyperlipidemia    Hypertension    Iron deficiency anemia 02/28/2015   Lumbar scoliosis    Opiate abuse, continuous ([place]) 06/20/2015   Sacral fracture, closed ([place])    Seizures ([place])    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, after GI bleed   Sleep apnea    hx not now since wt loss   Stones in the urinary tract    Syncope and collapse    Past Surgical History:  Procedure Laterality Date   ANTERIOR CERVICAL DECOMP/DISCECTOMY FUSION  01/26/2012   Procedure: ANTERIOR CERVICAL DECOMPRESSION/DISCECTOMY FUSION 3 LEVELS;  Surgeon: Floyce Stakes, MD;  [facility];  Service: Neurosurgery;  Laterality: N/A;  Cervical three-four Cervical four-five Cervical five-six Cervical six-seven , Anterior cervical decompression/diskectomy, fusion, plate   ANTERIOR LAT LUMBAR FUSION Right 04/25/2018   Procedure: Right Lumbar two-three Lumbar three-four Lumbar four-five anterior  lateral interbody fusion  with posterior percutaneous pedicle screws;  Surgeon: Erline Levine, MD;  Location: [place];  Service: Neurosurgery;  Laterality: Right;   [place], normal   CARDIAC CATHETERIZATION     [place]     COLONOSCOPY WITH PROPOFOL N/A 12/27/2017   Procedure: COLONOSCOPY WITH PROPOFOL;  Surgeon: Toledo, Benay Pike, MD;  Location: [facility];  Service: Gastroenterology;  Laterality: N/A;   ESOPHAGOGASTRODUODENOSCOPY (EGD) WITH PROPOFOL N/A 12/27/2017   Procedure: ESOPHAGOGASTRODUODENOSCOPY (EGD) WITH PROPOFOL;  Surgeon: Toledo, Benay Pike, MD;  Location: [facility];  Service: Gastroenterology;  Laterality: N/A;   ESOPHAGOGASTRODUODENOSCOPY (EGD) WITH PROPOFOL N/A 06/19/2021   Procedure: ESOPHAGOGASTRODUODENOSCOPY (EGD) WITH PROPOFOL;  Surgeon: [name], MD;  Location: [facility];  Service: Gastroenterology;  Laterality: N/A;   ESOPHAGOGASTRODUODENOSCOPY (EGD) WITH PROPOFOL N/A 09/25/2021   Procedure: ESOPHAGOGASTRODUODENOSCOPY (EGD) WITH PROPOFOL;  Surgeon: [name], MD;  Location: [facility];  Service: Endoscopy;  Laterality: N/A;   GASTRIC BYPASS  08/09/2008   [employer]   HARDWARE REMOVAL N/A 03/08/2022   Procedure: Removal of bilateral Thoracic ten pedicle screws;  Surgeon: Earnie Larsson, MD;  Location: [place];  Service: Neurosurgery;  Laterality: N/A;   HERNIA REPAIR  08/09/1998   hiatal   JOINT REPLACEMENT     KNEE ARTHROSCOPY     bilateral, left x 2   LAMINECTOMY WITH POSTERIOR LATERAL ARTHRODESIS LEVEL 4 N/A 01/14/2020   Procedure: Decompressive Laminectomy Lumbar One with pedicle screw fixation from Thoracic Ten to Lumbar Two;  Surgeon: Erline Levine, MD;  Location: [place];  Service: Neurosurgery;  Laterality: N/A;  Decompressive Laminectomy Lumbar One with pedicle screw fixation from Thoracic Ten to Lumbar Two   LUMBAR PERCUTANEOUS PEDICLE SCREW 3 LEVEL N/A 04/25/2018   Procedure: LUMBAR PERCUTANEOUS PEDICLE SCREW 3 LEVEL;  Surgeon: Erline Levine, MD;  Location: [place];  Service: Neurosurgery;  Laterality: N/A;   NASAL SINUS SURGERY      x5   OSTEOTOMY  08/10/1999   left   ROUX-EN-Y GASTRIC BYPASS     THORACIC LAMINECTOMY FOR EPIDURAL ABSCESS N/A 03/28/2022   Procedure: THORACIC WOUND WASHOUT;  Surgeon: Eustace Moore, MD;  Location: [place];  Service: Neurosurgery;  Laterality: N/A;   TONSILLECTOMY     TOTAL KNEE ARTHROPLASTY Left 05/09/2014   Dr. Marry Guan   UVULOPALATOPLASTY  08/09/2009  Social History   Tobacco Use   Smoking status: Never   Smokeless tobacco: Never  Vaping Use   Vaping Use: Never used  Substance Use Topics   Alcohol use: Yes    Comment: occassionally   Drug use: No    Family History  Problem Relation Age of Onset   Dementia Mother 65   Osteoporosis Mother    Heart disease Father 50   Diabetes Father    Lymphoma Sister        lymphoma, stage 4   Ovarian cancer Sister 60       Ovarian   Lupus Sister    Prostate cancer Maternal Uncle    Skin cancer Maternal Uncle    Tongue cancer Maternal Uncle        uncle died of heart attack   Alcoholism Maternal Uncle    Lung cancer Paternal Aunt        pat aunts x 4 died of lung cancer   Lung cancer Paternal Aunt    Lung cancer Paternal Aunt    Lung cancer Paternal Aunt    Mesothelioma Cousin        paternal cousin    Allergies  Allergen Reactions   Altace [Ramipril] Anaphylaxis   Ace Inhibitors Hives   Levaquin [Levofloxacin In D5w] Hives   Lyrica [Pregabalin] Other (See Comments)    Edema   Metformin Diarrhea    High doses cause diarrhea.    Trulicity [Dulaglutide] Nausea And Vomiting    Health Maintenance  Topic Date Due   COVID-19 Vaccine (3 - Pfizer risk series) 11/27/2019   OPHTHALMOLOGY EXAM  04/10/2020   FOOT EXAM  01/02/2022   INFLUENZA VACCINE  03/09/2022   URINE MICROALBUMIN  04/06/2022   HEMOGLOBIN A1C  09/04/2022   TETANUS/TDAP  06/03/2024   COLONOSCOPY (Pts 45-55yr Insurance coverage will need to be confirmed)  12/28/2027   Pneumonia Vaccine 71 Years old  Completed   Hepatitis C Screening  Completed    Zoster Vaccines- Shingrix  Completed   HPV VACCINES  Aged Out    Objective: BP (!) 143/95   Pulse 66   Resp 16   Ht 5' 5" (1.651 m)   Wt 167 lb 3.2 oz (75.8 kg)   SpO2 97%   BMI 27.82 kg/m   Physical Exam Constitutional:      Appearance: Normal appearance.  HENT:     Head: Normocephalic and atraumatic.      Mouth: Mucous membranes are moist.  Eyes:    Conjunctiva/sclera: Conjunctivae normal.     Pupils:  Cardiovascular:     Rate and Rhythm: Normal rate and regular rhythm.     Heart sounds:  Pulmonary:     Effort: Pulmonary effort is normal.     Breath sounds: Normal breath sounds.   Abdominal:     General: Non distended     Palpations: soft.   Musculoskeletal:        General: Normal range of motion.   Skin:    General: Skin is warm and dry.     Comments: posterior back wound has healed with no signs of infection, or redness or tenderness    Neurological:     General: grossly non focal     Mental Status: awake, alert and oriented to person, place, and time.   Psychiatric:        Mood and Affect: Mood normal.   Lab Results Lab Results  Component Value Date   WBC 7.4 03/30/2022  HGB 10.3 (L) 03/30/2022   HCT 32.5 (L) 03/30/2022   MCV 97.0 03/30/2022   PLT 263 03/30/2022    Lab Results  Component Value Date   CREATININE 1.07 03/30/2022   BUN 14 03/30/2022   NA 138 03/30/2022   K 3.7 03/30/2022   CL 107 03/30/2022   CO2 26 03/30/2022    Lab Results  Component Value Date   ALT 21 03/29/2022   AST 24 03/29/2022   ALKPHOS 50 03/29/2022   BILITOT 0.5 03/29/2022    Lab Results  Component Value Date   CHOL 157 04/06/2021   HDL 60.90 04/06/2021   LDLCALC 76 04/06/2021   LDLDIRECT 70.0 05/27/2021   TRIG 102.0 04/06/2021   CHOLHDL 3 04/06/2021   No results found for: "LABRPR", "RPRTITER" No results found for: "HIV1RNAQUANT", "HIV1RNAVL", "CD4TABS"   Assessment/Plan # Superficial surgical site infection in the setting of recent thoracic  surgery  - 8/20 s/p bilateral thoracic wound irrigation and debridement with primary closure. OR cx growing staph aureus  - Per OR note, it seems to be superficial infection with no deep extension beneath the fascia.  DC cephalexin as patient has approx 3 weeks of antibiotics by now  Discussed about signs and symptoms of infection to be watchful for and seek immediate attention in that case.  Fu in 3 weeks Fu with Neurosurgery   # Medication Monitoring  - labs today  I have personally spent 45 minutes involved in face-to-face and non-face-to-face activities for this patient on the day of the visit. Professional time spent includes the following activities: Preparing to see the patient (review of tests), Obtaining and/or reviewing separately obtained history (admission/discharge record), Performing a medically appropriate examination and/or evaluation , Ordering medications/tests/procedures, referring and communicating with other health care professionals, Documenting clinical information in the EMR, Independently interpreting results (not separately reported), Communicating results to the patient/family/caregiver, Counseling and educating the patient/family/caregiver and Care coordination (not separately reported).   Wilber Oliphant, [place] [facility] Infectious [facility] 04/20/2022, 9:57 AM

## 2022-04-21 ENCOUNTER — Telehealth: Payer: Self-pay

## 2022-04-21 ENCOUNTER — Telehealth: Payer: Self-pay | Admitting: Pharmacy Technician

## 2022-04-21 DIAGNOSIS — Z596 Low income: Secondary | ICD-10-CM

## 2022-04-21 LAB — BASIC METABOLIC PANEL
BUN: 16 mg/dL (ref 7–25)
CO2: 26 mmol/L (ref 20–32)
Calcium: 10.3 mg/dL (ref 8.6–10.3)
Chloride: 104 mmol/L (ref 98–110)
Creat: 1.1 mg/dL (ref 0.70–1.28)
Glucose, Bld: 118 mg/dL — ABNORMAL HIGH (ref 65–99)
Potassium: 4.5 mmol/L (ref 3.5–5.3)
Sodium: 139 mmol/L (ref 135–146)

## 2022-04-21 LAB — CBC
HCT: 42.9 % (ref 38.5–50.0)
Hemoglobin: 14 g/dL (ref 13.2–17.1)
MCH: 31.3 pg (ref 27.0–33.0)
MCHC: 32.6 g/dL (ref 32.0–36.0)
MCV: 96 fL (ref 80.0–100.0)
MPV: 11.3 fL (ref 7.5–12.5)
Platelets: 189 10*3/uL (ref 140–400)
RBC: 4.47 10*6/uL (ref 4.20–5.80)
RDW: 16.5 % — ABNORMAL HIGH (ref 11.0–15.0)
WBC: 7.1 10*3/uL (ref 3.8–10.8)

## 2022-04-21 LAB — C-REACTIVE PROTEIN: CRP: 0.3 mg/L (ref <8.0)

## 2022-04-21 LAB — SEDIMENTATION RATE: Sed Rate: 2 mm/h (ref 0–20)

## 2022-04-21 NOTE — Telephone Encounter (Signed)
-----   Message from Rosiland Oz, MD sent at 04/21/2022  8:46 AM EDT ----- Please let him know labs are unremarkable

## 2022-04-21 NOTE — Progress Notes (Signed)
[facility])                                            [place] Team    04/21/2022  Justin Patel [birth year] [id number]  Received both patient and provider portion(s) of patient assistance application(s) for Tradjenta. Faxed completed application and required documents into [employer].   Marisal Swarey P. Hallee Mckenny, [place]  [phone]

## 2022-04-21 NOTE — Telephone Encounter (Signed)
Called patient and relayed results verbatim per Dr. West Bali, "labs are unremarkable". Patient verbalized understanding and had no additional questions.  Binnie Kand, RN

## 2022-04-23 ENCOUNTER — Ambulatory Visit: Payer: Medicare PPO | Admitting: Family Medicine

## 2022-04-23 ENCOUNTER — Encounter: Payer: Self-pay | Admitting: Family Medicine

## 2022-04-23 VITALS — BP 110/70 | HR 66 | Temp 98.5°F | Ht 65.0 in | Wt 170.2 lb

## 2022-04-23 DIAGNOSIS — L304 Erythema intertrigo: Secondary | ICD-10-CM

## 2022-04-23 DIAGNOSIS — M545 Low back pain, unspecified: Secondary | ICD-10-CM

## 2022-04-23 DIAGNOSIS — T8149XA Infection following a procedure, other surgical site, initial encounter: Secondary | ICD-10-CM

## 2022-04-23 DIAGNOSIS — Z23 Encounter for immunization: Secondary | ICD-10-CM

## 2022-04-23 DIAGNOSIS — R52 Pain, unspecified: Secondary | ICD-10-CM | POA: Insufficient documentation

## 2022-04-23 DIAGNOSIS — G8929 Other chronic pain: Secondary | ICD-10-CM | POA: Diagnosis not present

## 2022-04-23 MED ORDER — TRAMADOL HCL 50 MG PO TABS: 50.0000 mg | ORAL_TABLET | Freq: Three times a day (TID) | ORAL | 0 refills | Status: DC | PRN

## 2022-04-23 MED ORDER — KETOCONAZOLE 2 % EX CREA: 1.0000 | TOPICAL_CREAM | Freq: Every day | CUTANEOUS | 0 refills | Status: DC

## 2022-04-23 NOTE — Assessment & Plan Note (Signed)
It is okay for the patient to go back onto tramadol 50-100 mg every 8 hours as needed for pain.  Controlled substance database reviewed.  He will not resume the tramadol until he has finished his course of Percocet.

## 2022-04-23 NOTE — Assessment & Plan Note (Signed)
>>  ASSESSMENT AND PLAN FOR CHRONIC BACK PAIN WRITTEN ON 04/23/2022 11:26 AM BY SONNENBERG, ERIC G, MD  It is okay for the patient to go back onto tramadol  50-100 mg every 8 hours as needed for pain.  Controlled substance database reviewed.  He will not resume the tramadol  until he has finished his course of Percocet.

## 2022-04-23 NOTE — Progress Notes (Signed)
[name], MD Phone: [phone]  Justin Patel is a 71 y.o. male who presents today for follow-up.  Surgical abscess: Patient is status post spinal surgery with removal of pedicle screws and subsequently developed a spinal abscess.  He was hospitalized and is now status post drainage and antibiotic therapy.  He notes he has seen neurosurgery once in follow-up and sees them next week.  He has also followed up with infectious disease.  He is no longer on antibiotics.  His back pain has improved back to his baseline.  He is now able to turn over and lay on his back.  He has no drainage from his back.  He does report some nerve discomfort from the right surgical scar that radiates around to his side.  He is on Percocet and notes he finishes this in 2 to 3 days and wants to go back to tramadol for pain management.  Rash: This is in his right groin and onto his right scrotum.  It is overall feeling and sore.  Its been present for a week or so.  Social History   Tobacco Use  Smoking Status Never  Smokeless Tobacco Never    Current Outpatient Medications on File Prior to Visit  Medication Sig Dispense Refill   acetaminophen (TYLENOL) 325 MG tablet Take 650 mg by mouth every 6 (six) hours as needed for moderate pain.     blood glucose meter kit and supplies KIT Dispense based on patient and insurance preference. Check once daily. ICD doing E11.9. 1 each 0   Calcium Carbonate-Vitamin D (CALCIUM 600/VITAMIN D PO) Take 1 tablet by mouth daily.     cephALEXin (KEFLEX) 500 MG capsule Take 1 capsule (500 mg total) by mouth 4 (four) times daily. 120 capsule 0   cyclobenzaprine (FLEXERIL) 5 MG tablet TAKE 1 TABLET BY MOUTH EVERY 8 HOURS AS NEEDED FOR SPASMS. 30 tablet 1   dapagliflozin propanediol (FARXIGA) 10 MG TABS tablet Take 1 tablet (10 mg total) by mouth daily before breakfast. 90 tablet 1   esomeprazole (NEXIUM) 40 MG capsule TAKE 1 CAPSULE BY MOUTH ONCE DAILY 90 capsule 1   ezetimibe  (ZETIA) 10 MG tablet TAKE ONE TABLET BY MOUTH EVERY DAY 90 tablet 1   furosemide (LASIX) 20 MG tablet TAKE ONE TABLET BY MOUTH EVERY DAY 90 tablet 1   gabapentin (NEURONTIN) 300 MG capsule Take 1 capsule (300 mg total) by mouth 3 (three) times daily. 270 capsule 1   metoprolol succinate (TOPROL-XL) 50 MG 24 hr tablet TAKE 1 TABLET BY MOUTH DAILY WITH OR IMMEDIATLY FOLLOWING A MEAL 90 tablet 1   metroNIDAZOLE (METROGEL) 0.75 % gel Apply 1 application topically 2 (two) times daily. For rosacea - apply to the cheeks, chin, and nose BID. (Patient taking differently: Apply 1 application  topically 2 (two) times daily as needed (rosacea). apply to the cheeks, chin, and nose) 45 g 3   Multiple Vitamin (MULTIVITAMIN) capsule Take 1 capsule by mouth daily. With copper and zinc     nystatin-triamcinolone ointment (MYCOLOG) Apply 1 application topically 2 (two) times daily. (Patient taking differently: Apply 1 application  topically daily as needed (irritation).) 30 g 0   ondansetron (ZOFRAN) 4 MG tablet Take 1 tablet (4 mg total) by mouth daily as needed for nausea or vomiting. 30 tablet 1   primidone (MYSOLINE) 50 MG tablet Take 1 tablet (50 mg total) by mouth at bedtime. 30 tablet 11   rosuvastatin (CRESTOR) 40 MG tablet TAKE ONE TABLET  EVERY DAY 90 tablet 0   sucralfate (CARAFATE) 1 g tablet TAKE 1 TABLET BY MOUTH 4 TIMES DAILY 360 tablet 1   tamsulosin (FLOMAX) 0.4 MG CAPS capsule TAKE 1 CAPSULE BY MOUTH EVERY DAY 90 capsule 3   TRADJENTA 5 MG TABS tablet TAKE 1 TABLET BY MOUTH DAILY 90 tablet 1   venlafaxine XR (EFFEXOR-XR) 75 MG 24 hr capsule TAKE ONE CAPSULE EVERY MORNING WITH BREAKFAST 90 capsule 1   No current facility-administered medications on file prior to visit.     ROS see history of present illness  Objective  Physical Exam Vitals:   04/23/22 1107  BP: 110/70  Pulse: 66  Temp: 98.5 F (36.9 C)  SpO2: 97%    BP Readings from Last 3 Encounters:  04/23/22 110/70  04/20/22 (!)  143/95  04/06/22 (!) 151/90   Wt Readings from Last 3 Encounters:  04/23/22 170 lb 3.2 oz (77.2 kg)  04/20/22 167 lb 3.2 oz (75.8 kg)  03/31/22 179 lb 3.7 oz (81.3 kg)    Physical Exam Constitutional:      General: He is not in acute distress.    Appearance: He is not diaphoretic.  Cardiovascular:     Rate and Rhythm: Normal rate and regular rhythm.     Heart sounds: Normal heart sounds.  Pulmonary:     Effort: Pulmonary effort is normal.     Breath sounds: Normal breath sounds.  Musculoskeletal:       Back:  Skin:    General: Skin is warm and dry.     Comments: [place] intertrigo rash right groin and right scrotum  Neurological:     Mental Status: He is alert.      Assessment/Plan: Please see individual problem list.  Problem List Items Addressed This Visit     Chronic back pain (Chronic)    It is okay for the patient to go back onto tramadol 50-100 mg every 8 hours as needed for pain.  Controlled substance database reviewed.  He will not resume the tramadol until he has finished his course of Percocet.      Relevant Medications   traMADol (ULTRAM) 50 MG tablet   Intertrigo - Primary    Patient has candidal intertrigo.  This could be related to recently being on antibiotics or 10 being on Farxiga.  We will treat with topical ketoconazole.  He will hold his [place] until this resolves.  Once it resolves he can resume the [place] though he was advised that if he has any recurrence of this he would need to discontinue [place] moving forward.      Relevant Medications   ketoconazole (NIZORAL) 2 % cream   Wound infection after surgery    Surgical site is well-healing.  He will continue to follow-up with neurosurgery.      Relevant Medications   ketoconazole (NIZORAL) 2 % cream   Other Visit Diagnoses     Need for immunization against influenza       Relevant Orders   Flu Vaccine QUAD High Dose(Fluad) (Completed)   Need for pneumococcal 20-valent conjugate  vaccination       Relevant Orders   Pneumococcal conjugate vaccine 20-valent (Prevnar 20)        Health Maintenance: Prevnar 20 given today.  Shared decision making discussion completed with patient regarding Prevnar 20.  He had previously completed his Pneumovax and Prevnar 13 vaccines.  Discussed that the Prevnar could provide some slight additional benefit for him given that its been  5 years since his last pneumonia vaccine.  Discussed there are minimal risks related with this vaccine.  Patient opted to have the Prevnar 20 given today.  Return in about 3 months (around 07/23/2022).  I have spent 31 minutes in the care of this patient regarding history taking, documentation, completion of exam, discussion of plan, counseling on Prevnar vaccine, placing orders.   [name], MD [place]

## 2022-04-23 NOTE — Patient Instructions (Signed)
Nice to see you. Please use the ketoconazole twice daily until your rash is gone.  If its not helping please let me know.  Please hold the Farxiga until the rash resolves.  You can then resume the [place].  If you have any recurrence of this issue while in the future please stop the [place] and let me know.

## 2022-04-23 NOTE — Assessment & Plan Note (Signed)
Surgical site is well-healing.  He will continue to follow-up with neurosurgery.

## 2022-04-23 NOTE — Assessment & Plan Note (Signed)
Patient has candidal intertrigo.  This could be related to recently being on antibiotics or 10 being on Farxiga.  We will treat with topical ketoconazole.  He will hold his [place] Glade until this resolves.  Once it resolves he can resume the [place] though he was advised that if he has any recurrence of this he would need to discontinue [place] moving forward.

## 2022-04-28 DIAGNOSIS — S92322D Displaced fracture of second metatarsal bone, left foot, subsequent encounter for fracture with routine healing: Secondary | ICD-10-CM | POA: Diagnosis not present

## 2022-04-28 DIAGNOSIS — S99922A Unspecified injury of left foot, initial encounter: Secondary | ICD-10-CM | POA: Diagnosis not present

## 2022-04-28 DIAGNOSIS — S92345D Nondisplaced fracture of fourth metatarsal bone, left foot, subsequent encounter for fracture with routine healing: Secondary | ICD-10-CM | POA: Diagnosis not present

## 2022-04-28 DIAGNOSIS — S92332D Displaced fracture of third metatarsal bone, left foot, subsequent encounter for fracture with routine healing: Secondary | ICD-10-CM | POA: Diagnosis not present

## 2022-04-28 DIAGNOSIS — M79672 Pain in left foot: Secondary | ICD-10-CM | POA: Diagnosis not present

## 2022-04-29 ENCOUNTER — Telehealth: Payer: Self-pay | Admitting: Pharmacy Technician

## 2022-04-29 DIAGNOSIS — Z596 Low income: Secondary | ICD-10-CM

## 2022-04-29 NOTE — Progress Notes (Signed)
[facility])                                            [place] Team    04/29/2022  SYMEON PULEO 1950-09-08 [id number]  Successful outreach call placed to patient in regard to [place] application with [employer].  Spoke to patient, HIPAA verified.  Informed patient of BI's request for him to provide individual forms of income as 1040 did not accurately reflect current income. Patient informs to hold off on pursing Tradjenta with BI as he informs he should be able to afford it since he is receiving [place] with [employer].  Salina Stanfield P. Abishai Viegas, [place]  [phone]

## 2022-05-10 ENCOUNTER — Ambulatory Visit (INDEPENDENT_AMBULATORY_CARE_PROVIDER_SITE_OTHER): Payer: Medicare PPO | Admitting: Family Medicine

## 2022-05-10 ENCOUNTER — Ambulatory Visit (INDEPENDENT_AMBULATORY_CARE_PROVIDER_SITE_OTHER): Payer: Medicare PPO

## 2022-05-10 ENCOUNTER — Encounter: Payer: Self-pay | Admitting: Family Medicine

## 2022-05-10 VITALS — BP 120/70 | HR 81 | Temp 98.8°F | Ht 65.0 in | Wt 167.0 lb

## 2022-05-10 DIAGNOSIS — E119 Type 2 diabetes mellitus without complications: Secondary | ICD-10-CM

## 2022-05-10 DIAGNOSIS — R0781 Pleurodynia: Secondary | ICD-10-CM

## 2022-05-10 DIAGNOSIS — E785 Hyperlipidemia, unspecified: Secondary | ICD-10-CM | POA: Diagnosis not present

## 2022-05-10 DIAGNOSIS — S2242XA Multiple fractures of ribs, left side, initial encounter for closed fracture: Secondary | ICD-10-CM | POA: Diagnosis not present

## 2022-05-10 MED ORDER — OXYCODONE-ACETAMINOPHEN 5-325 MG PO TABS: 1.0000 | ORAL_TABLET | Freq: Four times a day (QID) | ORAL | 0 refills | Status: DC | PRN

## 2022-05-10 NOTE — Progress Notes (Signed)
[name], MD Phone: [phone]  Justin Patel is a 71 y.o. male who presents today for f/u.  DIABETES Disease Monitoring: Blood Sugar ranges-not checking Polyuria/phagia/dipsia- no      Optho- due Medications: Compliance- taking tradjenta Hypoglycemic symptoms- no  HYPERLIPIDEMIA Symptoms Chest pain on exertion:  no   Leg claudication:   no Medications: Compliance- taking crestor, zetia Right upper quadrant pain- no  Muscle aches- no Lipid Panel     Component Value Date/Time   CHOL 157 04/06/2021 1629   CHOL 171 03/14/2019 0000   TRIG 102.0 04/06/2021 1629   HDL 60.90 04/06/2021 1629   HDL 64 03/14/2019 0000   CHOLHDL 3 04/06/2021 1629   VLDL 20.4 04/06/2021 1629   LDLCALC 76 04/06/2021 1629   LDLCALC 77 03/14/2019 0000   LDLDIRECT 70.0 05/27/2021 0948   LABVLDL 30 03/14/2019 0000   Left rib pain: Patient notes the other day he was sitting in his recliner and then got up to sit on the bed and he hit his left ribs on the frame of the bed which is metal.  He has had left rib pain since then.  He wonders if he broke a rib.  Tramadol has not been helpful for the pain.   Social History   Tobacco Use  Smoking Status Never  Smokeless Tobacco Never    Current Outpatient Medications on File Prior to Visit  Medication Sig Dispense Refill   acetaminophen (TYLENOL) 325 MG tablet Take 650 mg by mouth every 6 (six) hours as needed for moderate pain.     blood glucose meter kit and supplies KIT Dispense based on patient and insurance preference. Check once daily. ICD doing E11.9. 1 each 0   Calcium Carbonate-Vitamin D (CALCIUM 600/VITAMIN D PO) Take 1 tablet by mouth daily.     cyclobenzaprine (FLEXERIL) 5 MG tablet TAKE 1 TABLET BY MOUTH EVERY 8 HOURS AS NEEDED FOR SPASMS. 30 tablet 1   dapagliflozin propanediol (FARXIGA) 10 MG TABS tablet Take 1 tablet (10 mg total) by mouth daily before breakfast. 90 tablet 1   esomeprazole (NEXIUM) 40 MG capsule TAKE 1 CAPSULE BY  MOUTH ONCE DAILY 90 capsule 1   ezetimibe (ZETIA) 10 MG tablet TAKE ONE TABLET BY MOUTH EVERY DAY 90 tablet 1   furosemide (LASIX) 20 MG tablet TAKE ONE TABLET BY MOUTH EVERY DAY 90 tablet 1   gabapentin (NEURONTIN) 300 MG capsule Take 1 capsule (300 mg total) by mouth 3 (three) times daily. 270 capsule 1   ketoconazole (NIZORAL) 2 % cream Apply 1 Application topically daily. 15 g 0   metoprolol succinate (TOPROL-XL) 50 MG 24 hr tablet TAKE 1 TABLET BY MOUTH DAILY WITH OR IMMEDIATLY FOLLOWING A MEAL 90 tablet 1   metroNIDAZOLE (METROGEL) 0.75 % gel Apply 1 application topically 2 (two) times daily. For rosacea - apply to the cheeks, chin, and nose BID. (Patient taking differently: Apply 1 application  topically 2 (two) times daily as needed (rosacea). apply to the cheeks, chin, and nose) 45 g 3   Multiple Vitamin (MULTIVITAMIN) capsule Take 1 capsule by mouth daily. With copper and zinc     nystatin-triamcinolone ointment (MYCOLOG) Apply 1 application topically 2 (two) times daily. (Patient taking differently: Apply 1 application  topically daily as needed (irritation).) 30 g 0   ondansetron (ZOFRAN) 4 MG tablet Take 1 tablet (4 mg total) by mouth daily as needed for nausea or vomiting. 30 tablet 1   primidone (MYSOLINE) 50 MG tablet Take  1 tablet (50 mg total) by mouth at bedtime. 30 tablet 11   rosuvastatin (CRESTOR) 40 MG tablet TAKE ONE TABLET EVERY DAY 90 tablet 0   sucralfate (CARAFATE) 1 g tablet TAKE 1 TABLET BY MOUTH 4 TIMES DAILY 360 tablet 1   tamsulosin (FLOMAX) 0.4 MG CAPS capsule TAKE 1 CAPSULE BY MOUTH EVERY DAY 90 capsule 3   TRADJENTA 5 MG TABS tablet TAKE 1 TABLET BY MOUTH DAILY 90 tablet 1   traMADol (ULTRAM) 50 MG tablet Take 1-2 tablets (50-100 mg total) by mouth every 8 (eight) hours as needed. 180 tablet 0   venlafaxine XR (EFFEXOR-XR) 75 MG 24 hr capsule TAKE ONE CAPSULE EVERY MORNING WITH BREAKFAST 90 capsule 1   No current facility-administered medications on file prior to  visit.     ROS see history of present illness  Objective  Physical Exam Vitals:   05/10/22 1555 05/10/22 1618  BP: 130/80 120/70  Pulse: 81   Temp: 98.8 F (37.1 C)   SpO2: 99%     BP Readings from Last 3 Encounters:  05/10/22 120/70  04/23/22 110/70  04/20/22 (!) 143/95   Wt Readings from Last 3 Encounters:  05/10/22 167 lb (75.8 kg)  04/23/22 170 lb 3.2 oz (77.2 kg)  04/20/22 167 lb 3.2 oz (75.8 kg)    Physical Exam Constitutional:      General: He is not in acute distress.    Appearance: He is not diaphoretic.  Cardiovascular:     Rate and Rhythm: Normal rate and regular rhythm.     Heart sounds: Normal heart sounds.  Pulmonary:     Effort: Pulmonary effort is normal.     Breath sounds: Normal breath sounds.  Musculoskeletal:     Comments: Tenderness to his left lower anterior and mid axillary ribs, no palpable bony defects, no flail chest  Skin:    General: Skin is warm and dry.  Neurological:     Mental Status: He is alert.      Assessment/Plan: Please see individual problem list.  Problem List Items Addressed This Visit     Diabetes ([place]) (Chronic)    Well-controlled on most recent A1c.  He will continue Tradjenta 5 mg daily.  Plan for A1c at his next visit.      Hyperlipidemia (Chronic)    Continue Crestor 40 mg daily and Zetia 10 mg daily.      Rib pain on left side - Primary    Patient with either bruised ribs or a broken rib.  X-ray completed today.  Discussed pain control with Percocet.  Advised to monitor for drowsiness and if he is excessively drowsy with this he will let us know and discontinue use of Percocet.  Discussed risk of getting pneumonia if he has a broken rib.  Discussed taking frequent deep breaths.  He will contact us immediately if he develops shortness of breath, fever, or cough.  He was advised that he should not take Percocet and tramadol together.  He will not take the tramadol while he is taking the Percocet.       Relevant Medications   oxyCODONE-acetaminophen (PERCOCET/ROXICET) 5-325 MG tablet   Other Relevant Orders   DG Ribs Unilateral W/Chest Left     Health Maintenance: PATIENT WILL CALL TO SCHEDULE OPTHO APPOINTMENT.   Return in about 3 months (around 08/10/2022) for Diabetes/hyperlipidemia/hypertension.   [name], MD [place]

## 2022-05-10 NOTE — Assessment & Plan Note (Signed)
Well-controlled on most recent A1c.  He will continue Tradjenta 5 mg daily.  Plan for A1c at his next visit.

## 2022-05-10 NOTE — Assessment & Plan Note (Addendum)
Patient with either bruised ribs or a broken rib.  X-ray completed today.  Discussed pain control with Percocet.  Advised to monitor for drowsiness and if he is excessively drowsy with this he will let us know and discontinue use of Percocet.  Discussed risk of getting pneumonia if he has a broken rib.  Discussed taking frequent deep breaths.  He will contact us immediately if he develops shortness of breath, fever, or cough.  He was advised that he should not take Percocet and tramadol together.  He will not take the tramadol while he is taking the Percocet.

## 2022-05-10 NOTE — Patient Instructions (Signed)
Nice to see you. We will contact you with your x-ray result. You can use the Percocet for severe pain with your left ribs.  Do not take your tramadol while you are taking the Percocet.  If you get excessively drowsy with the Percocet please discontinue use of it and let us know.  If you develop fever, cough, or shortness of breath please let us know immediately.

## 2022-05-10 NOTE — Assessment & Plan Note (Signed)
Continue Crestor 40 mg daily and Zetia 10 mg daily. 

## 2022-05-11 ENCOUNTER — Ambulatory Visit (INDEPENDENT_AMBULATORY_CARE_PROVIDER_SITE_OTHER): Payer: Medicare PPO | Admitting: Infectious Diseases

## 2022-05-11 ENCOUNTER — Telehealth: Payer: Self-pay

## 2022-05-11 ENCOUNTER — Other Ambulatory Visit: Payer: Self-pay

## 2022-05-11 ENCOUNTER — Encounter: Payer: Self-pay | Admitting: Infectious Diseases

## 2022-05-11 VITALS — BP 114/77 | HR 70 | Resp 16 | Ht 65.0 in | Wt 171.0 lb

## 2022-05-11 DIAGNOSIS — Z79899 Other long term (current) drug therapy: Secondary | ICD-10-CM | POA: Insufficient documentation

## 2022-05-11 DIAGNOSIS — T8149XA Infection following a procedure, other surgical site, initial encounter: Secondary | ICD-10-CM

## 2022-05-11 DIAGNOSIS — R7612 Nonspecific reaction to cell mediated immunity measurement of gamma interferon antigen response without active tuberculosis: Secondary | ICD-10-CM

## 2022-05-11 NOTE — Progress Notes (Signed)
Patient Active Problem List   Diagnosis Date Noted  . Rib pain on left side 05/10/2022  . Pain 04/23/2022  . Medication monitoring encounter 04/20/2022  . Wound infection after surgery 03/28/2022  . Lumbar pseudoarthrosis 03/08/2022  . Foot fracture 02/02/2022  . Thoracic radiculopathy 02/01/2022  . Iron deficiency 01/19/2022  . History of fusion of thoracic spine 12/28/2021  . Pain in thoracic spine 12/28/2021  . Chronic back pain 09/25/2020  . Aortic atherosclerosis ([place]) 05/20/2020  . Common bile duct dilatation 05/20/2020  . S/P laparoscopic cholecystectomy 02/18/2020  . History of gastric bypass   . Fracture of L1 vertebra ([place]) 01/14/2020  . Itchy skin 12/04/2019  . Arthralgia 08/17/2019  . Mass of submandibular region 08/17/2019  . Pain of right thumb 08/17/2019  . Numbness of face 05/22/2019  . Chronic night sweats 03/21/2019  . Chronic abdominal pain 02/27/2019  . Weakness of both lower limbs 08/31/2018  . Muscle weakness 08/24/2018  . Dyspnea on exertion 08/24/2018  . Depression, major, single episode, mild ([place]) 08/24/2018  . Macrocytosis without anemia 07/05/2018  . RLQ abdominal pain 05/18/2018  . CKD (chronic kidney disease) stage 3, GFR 30-59 ml/min 05/08/2018  . Lumbar scoliosis 04/25/2018  . Easy bruising 12/09/2017  . 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 Abnormal weight loss 10/18/2017  . GERD (gastroesophageal reflux disease) 10/18/2017  . Constipation 10/18/2017  . Osteoporosis without current pathological fracture 07/20/2017  . Vitamin D deficiency 07/20/2017  . Osteopenia determined by x-ray 04/25/2017  . Rash 03/11/2017  . Seborrheic dermatitis 02/24/2017  . Intertrigo 02/24/2017  . Tachycardia 09/27/2016  . Anxiety 06/25/2016  . Cervical facet syndrome 03/15/2016  . Facet syndrome, lumbar 03/15/2016  . DDD (degenerative disc disease), cervical 03/15/2016  . DDD (degenerative disc disease), lumbar 03/15/2016  . Other iron  deficiency anemias 11/27/2015  . Recurrent falls 07/25/2015  . Chronic pain syndrome 04/16/2015  . Alcohol abuse 10/01/2014  . Syncope 06/03/2014  . 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 BPH (benign prostatic hyperplasia) 11/16/2013  . Enlarged prostate 11/08/2013  . Anemia 11/08/2013  . H/O urinary stone 10/05/2013  . Renal cyst 08/27/2013  . Diabetes ([place]) 03/21/2013  . Essential hypertension, benign 03/21/2013  . Hyperlipidemia 03/21/2013  . Overweight (BMI 25.0-29.9) 03/21/2013    Patient's Medications  New Prescriptions   No medications on file  Previous Medications   ACETAMINOPHEN (TYLENOL) 325 MG TABLET    Take 650 mg by mouth every 6 (six) hours as needed for moderate pain.   BLOOD GLUCOSE METER KIT AND SUPPLIES KIT    Dispense based on patient and insurance preference. Check once daily. ICD doing E11.9.   CALCIUM CARBONATE-VITAMIN D (CALCIUM 600/VITAMIN D PO)    Take 1 tablet by mouth daily.   CYCLOBENZAPRINE (FLEXERIL) 5 MG TABLET    TAKE 1 TABLET BY MOUTH EVERY 8 HOURS AS NEEDED FOR SPASMS.   DAPAGLIFLOZIN PROPANEDIOL (FARXIGA) 10 MG TABS TABLET    Take 1 tablet (10 mg total) by mouth daily before breakfast.   ESOMEPRAZOLE (NEXIUM) 40 MG CAPSULE    TAKE 1 CAPSULE BY MOUTH ONCE DAILY   EZETIMIBE (ZETIA) 10 MG TABLET    TAKE ONE TABLET BY MOUTH EVERY DAY   FUROSEMIDE (LASIX) 20 MG TABLET    TAKE ONE TABLET BY MOUTH EVERY DAY   GABAPENTIN (NEURONTIN) 300 MG CAPSULE    Take 1 capsule (300 mg total) by mouth 3 (three) times daily.   KETOCONAZOLE (NIZORAL) 2 % CREAM  Apply 1 Application topically daily.   METOPROLOL SUCCINATE (TOPROL-XL) 50 MG 24 HR TABLET    TAKE 1 TABLET BY MOUTH DAILY WITH OR IMMEDIATLY FOLLOWING A MEAL   METRONIDAZOLE (METROGEL) 0.75 % GEL    Apply 1 application topically 2 (two) times daily. For rosacea - apply to the cheeks, chin, and nose BID.   MULTIPLE VITAMIN (MULTIVITAMIN) CAPSULE    Take 1 capsule by mouth daily. With copper and zinc    NYSTATIN-TRIAMCINOLONE OINTMENT (MYCOLOG)    Apply 1 application topically 2 (two) times daily.   ONDANSETRON (ZOFRAN) 4 MG TABLET    Take 1 tablet (4 mg total) by mouth daily as needed for nausea or vomiting.   OXYCODONE-ACETAMINOPHEN (PERCOCET/ROXICET) 5-325 MG TABLET    Take 1 tablet by mouth every 6 (six) hours as needed for up to 5 days for severe pain.   PRIMIDONE (MYSOLINE) 50 MG TABLET    Take 1 tablet (50 mg total) by mouth at bedtime.   ROSUVASTATIN (CRESTOR) 40 MG TABLET    TAKE ONE TABLET EVERY DAY   SUCRALFATE (CARAFATE) 1 G TABLET    TAKE 1 TABLET BY MOUTH 4 TIMES DAILY   TAMSULOSIN (FLOMAX) 0.4 MG CAPS CAPSULE    TAKE 1 CAPSULE BY MOUTH EVERY DAY   TRADJENTA 5 MG TABS TABLET    TAKE 1 TABLET BY MOUTH DAILY   TRAMADOL (ULTRAM) 50 MG TABLET    Take 1-2 tablets (50-100 mg total) by mouth every 8 (eight) hours as needed.   VENLAFAXINE XR (EFFEXOR-XR) 75 MG 24 HR CAPSULE    TAKE ONE CAPSULE EVERY MORNING WITH BREAKFAST  Modified Medications   No medications on file  Discontinued Medications   No medications on file    Subjective: 71 Y O male with PMH as below including anterior cervical c3-c7  decompression/discectomy fusion 2013, Rt anterolateral lumbar L3-L5  interbody Instrumented fusion 2019 ,decompressive Lumbar laminectomy with pedicle screw fixation from Thoracic Ten to Lumbar Two with posterolateral arthrodesis 2021 followed by removal of bilateral thoracic ten pedicle screws 02/2022 who is here for HFU for superficial surgical site infection in the setting of recent thoracic surgery. 8/20 s/p bilateral thoracic wound irrigation and debridement with primary closure. OR cx growing staph aureus. Patient was discharged on 8/24 with po cephalexin.   Today's Visit He has been taking cephalexin as prescribed without missing doses. Denies any concerns with the antibiotics. Denies any fevers, chills, or drainage from the posterior back wound. However, he feels tired and has low energy.  Denies nausea, vomiting, abdominal pain and diarrhea. Back pain is 7-8/10 on most days and takes 2 percocet every 4 hrs and 2 tramadol three times a day. He has a fu with Dr Trenton Gammon next week.  No symptoms otherwise.   05/11/22 Stopped taking abtx since last office visit. Denies fevers, chills, sweats. Denies nausea, vomiting and diarrhea. Back pain ranges in between 6-7/10 and needs to take two percocet two times in a day. He tells me he has also seen Dr Trenton Gammon and was OK with stopping the antibiotics. He is planning to go to a trip to [place] to visit his son. He feels well otherwise with no concerns.   Review of Systems: all systems reviewed with pertinent positives and negatives as listed above  Past Medical History:  Diagnosis Date  . 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 Anxiety   . Arthritis   . 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  removal scheduled to clear margins.  . CHF (congestive heart failure) ([place])    no longer after weight loss  . Chronic kidney disease    cysts  . Degenerative disc disease   . Depression   . Diabetes mellitus    no longer diabetic-or on meds  . DJD (degenerative joint disease)   . Dysplastic nevus 02/04/2016   R upper back paraspinal - severe  . Family history of adverse reaction to anesthesia    sons wake up combative  . Foot fracture, left 01/2022  . Gastric ulcer   . GERD (gastroesophageal reflux disease)   . H/O hiatal hernia   . Headache(784.0)    sinus  . Heart murmur   . Hx of congestive heart failure   . Hyperlipidemia   . Hypertension   . Iron deficiency anemia 02/28/2015  . Lumbar scoliosis   . Opiate abuse, continuous ([place]) 06/20/2015  . Sacral fracture, closed ([place])   . Seizures ([place])    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, after GI bleed  . Sleep apnea    hx not now since wt loss  . Stones in the urinary tract   . Syncope and collapse    Past Surgical History:  Procedure  Laterality Date  . ANTERIOR CERVICAL DECOMP/DISCECTOMY FUSION  01/26/2012   Procedure: ANTERIOR CERVICAL DECOMPRESSION/DISCECTOMY FUSION 3 LEVELS;  Surgeon: Floyce Stakes, MD;  [facility]: [facility];  Service: Neurosurgery;  Laterality: N/A;  Cervical three-four Cervical four-five Cervical five-six Cervical six-seven , Anterior cervical decompression/diskectomy, fusion, plate  . ANTERIOR LAT LUMBAR FUSION Right 04/25/2018   Procedure: Right Lumbar two-three Lumbar three-four Lumbar four-five anterior  lateral interbody fusion  with posterior percutaneous pedicle screws;  Surgeon: Erline Levine, MD;  Location: [facility];  Service: Neurosurgery;  Laterality: Right;  . APPENDECTOMY    . [place], normal  . CARDIAC CATHETERIZATION     [place]    . COLONOSCOPY WITH PROPOFOL N/A 12/27/2017   Procedure: COLONOSCOPY WITH PROPOFOL;  Surgeon: Toledo, Benay Pike, MD;  Location: [facility];  Service: Gastroenterology;  Laterality: N/A;  . ESOPHAGOGASTRODUODENOSCOPY (EGD) WITH PROPOFOL N/A 12/27/2017   Procedure: ESOPHAGOGASTRODUODENOSCOPY (EGD) WITH PROPOFOL;  Surgeon: Toledo, Benay Pike, MD;  Location: [facility];  Service: Gastroenterology;  Laterality: N/A;  . ESOPHAGOGASTRODUODENOSCOPY (EGD) WITH PROPOFOL N/A 06/19/2021   Procedure: ESOPHAGOGASTRODUODENOSCOPY (EGD) WITH PROPOFOL;  Surgeon: [name], [place];  Location: [facility];  Service: Gastroenterology;  Laterality: N/A;  . ESOPHAGOGASTRODUODENOSCOPY (EGD) WITH PROPOFOL N/A 09/25/2021   Procedure: ESOPHAGOGASTRODUODENOSCOPY (EGD) WITH PROPOFOL;  Surgeon: [name], [place];  Location: [facility];  Service: Endoscopy;  Laterality: N/A;  . GASTRIC BYPASS  08/09/2008   [employer]  . HARDWARE REMOVAL N/A 03/08/2022   Procedure: Removal of bilateral Thoracic ten pedicle screws;  Surgeon: Earnie Larsson, [place];  Location: [place];  Service: Neurosurgery;  Laterality: N/A;   . HERNIA REPAIR  08/09/1998   hiatal  . JOINT REPLACEMENT    . KNEE ARTHROSCOPY     bilateral, left x 2  . LAMINECTOMY WITH POSTERIOR LATERAL ARTHRODESIS LEVEL 4 N/A 01/14/2020   Procedure: Decompressive Laminectomy Lumbar One with pedicle screw fixation from Thoracic Ten to Lumbar Two;  Surgeon: Erline Levine, MD;  Location: [facility];  Service: Neurosurgery;  Laterality: N/A;  Decompressive Laminectomy Lumbar One with pedicle screw fixation from Thoracic Ten to Lumbar Two  . LUMBAR  PERCUTANEOUS PEDICLE SCREW 3 LEVEL N/A 04/25/2018   Procedure: LUMBAR PERCUTANEOUS PEDICLE SCREW 3 LEVEL;  Surgeon: Erline Levine, MD;  Location: [facility];  Service: Neurosurgery;  Laterality: N/A;  . NASAL SINUS SURGERY     x5  . OSTEOTOMY  08/10/1999   left  . ROUX-EN-Y GASTRIC BYPASS    . THORACIC LAMINECTOMY FOR EPIDURAL ABSCESS N/A 03/28/2022   Procedure: THORACIC WOUND WASHOUT;  Surgeon: Eustace Moore, MD;  Location: [facility];  Service: Neurosurgery;  Laterality: N/A;  . TONSILLECTOMY    . TOTAL KNEE ARTHROPLASTY Left 05/09/2014   Dr. Marry Guan  . UVULOPALATOPLASTY  08/09/2009    Social History   Tobacco Use  . Smoking status: Never  . Smokeless tobacco: Never  Vaping Use  . Vaping Use: Never used  Substance Use Topics  . Alcohol use: Yes    Comment: occassionally  . Drug use: No    Family History  Problem Relation Age of Onset  . Dementia Mother 70  . Osteoporosis Mother   . Heart disease Father 23  . Diabetes Father   . Lymphoma Sister        lymphoma, stage 4  . Ovarian cancer Sister 38       Ovarian  . Lupus Sister   . Prostate cancer Maternal Uncle   . Skin cancer Maternal Uncle   . Tongue cancer Maternal Uncle        uncle died of heart attack  . Alcoholism Maternal Uncle   . Lung cancer Paternal Aunt        pat aunts x 4 died of lung cancer  . Lung cancer Paternal Aunt   . Lung cancer Paternal Aunt   . Lung cancer Paternal Aunt   . Mesothelioma Cousin        paternal cousin     Allergies  Allergen Reactions  . Altace [Ramipril] Anaphylaxis  . Ace Inhibitors Hives  . Levaquin [Levofloxacin In D5w] Hives  . Lyrica [Pregabalin] Other (See Comments)    Edema  . Metformin Diarrhea    High doses cause diarrhea.   Danelle Berry [Dulaglutide] Nausea And Vomiting    Health Maintenance  Topic Date Due  . OPHTHALMOLOGY EXAM  04/10/2020  . FOOT EXAM  01/02/2022  . COVID-19 Vaccine (3 - Pfizer risk series) 05/26/2022 (Originally 11/27/2019)  . HEMOGLOBIN A1C  09/04/2022  . Diabetic kidney evaluation - Urine ACR  02/18/2023  . Diabetic kidney evaluation - GFR measurement  04/21/2023  . TETANUS/TDAP  06/03/2024  . COLONOSCOPY (Pts 45-65yr Insurance coverage will need to be confirmed)  12/28/2027  . Pneumonia Vaccine 71 Years old  Completed  . INFLUENZA VACCINE  Completed  . Hepatitis C Screening  Completed  . Zoster Vaccines- Shingrix  Completed  . HPV VACCINES  Aged Out    Objective: BP 114/77   Pulse 70   Resp 16   Ht 5' 5"  (1.651 m)   Wt 171 lb (77.6 kg)   SpO2 95%   BMI 28.46 kg/m    Physical Exam Constitutional:      Appearance: Normal appearance.  HENT:     Head: Normocephalic and atraumatic.      Mouth: Mucous membranes are moist.  Eyes:    Conjunctiva/sclera: Conjunctivae normal.     Pupils:  Cardiovascular:     Rate and Rhythm: Normal rate and regular rhythm.     Heart sounds:  Pulmonary:     Effort: Pulmonary effort is normal.  Breath sounds: Normal breath sounds.   Abdominal:     General: Non distended     Palpations: soft.   Musculoskeletal:        General: Normal range of motion.   Skin:    General: Skin is warm and dry.     Comments: posterior back wound has healed with no signs of infection, or redness or tenderness     Neurological:     General: grossly non focal     Mental Status: awake, alert and oriented to person, place, and time.   Psychiatric:        Mood and Affect: Mood normal.   Lab  Results Lab Results  Component Value Date   WBC 7.1 04/20/2022   HGB 14.0 04/20/2022   HCT 42.9 04/20/2022   MCV 96.0 04/20/2022   PLT 189 04/20/2022    Lab Results  Component Value Date   CREATININE 1.10 04/20/2022   BUN 16 04/20/2022   NA 139 04/20/2022   K 4.5 04/20/2022   CL 104 04/20/2022   CO2 26 04/20/2022    Lab Results  Component Value Date   ALT 21 03/29/2022   AST 24 03/29/2022   ALKPHOS 50 03/29/2022   BILITOT 0.5 03/29/2022    Lab Results  Component Value Date   CHOL 157 04/06/2021   HDL 60.90 04/06/2021   LDLCALC 76 04/06/2021   LDLDIRECT 70.0 05/27/2021   TRIG 102.0 04/06/2021   CHOLHDL 3 04/06/2021   No results found for: "LABRPR", "RPRTITER" No results found for: "HIV1RNAQUANT", "HIV1RNAVL", "CD4TABS"   Assessment/Plan # Superficial surgical site infection in the setting of recent thoracic surgery  - 8/20 s/p bilateral thoracic wound irrigation and debridement with primary closure. OR cx growing staph aureus  - Per OR note, it seems to be superficial infection with no deep extension beneath the fascia.  DC cephalexin as patient has approx 3 weeks of antibiotics by now  Discussed about signs and symptoms of infection to be watchful for and seek immediate attention in that case.  Fu in 3 weeks Fu with Neurosurgery   # Medication Monitoring  - labs today  I have personally spent 45 minutes involved in face-to-face and non-face-to-face activities for this patient on the day of the visit. Professional time spent includes the following activities: Preparing to see the patient (review of tests), Obtaining and/or reviewing separately obtained history (admission/discharge record), Performing a medically appropriate examination and/or evaluation , Ordering medications/tests/procedures, referring and communicating with other health care professionals, Documenting clinical information in the EMR, Independently interpreting results (not separately reported),  Communicating results to the patient/family/caregiver, Counseling and educating the patient/family/caregiver and Care coordination (not separately reported).   Wilber Oliphant, [facility] 05/11/2022, 11:46 AM

## 2022-05-11 NOTE — Telephone Encounter (Signed)
Patient states he would like to know if we have the results of his x-rays.  Please call.

## 2022-05-12 ENCOUNTER — Telehealth: Payer: Self-pay

## 2022-05-12 NOTE — Telephone Encounter (Signed)
See result note. Justin Patel,cma  

## 2022-05-12 NOTE — Telephone Encounter (Signed)
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 Could you please get records from there regarding whether patient was treated or not ?

## 2022-05-12 NOTE — Telephone Encounter (Signed)
[facility] department to see if patient was treated for TB back in 2019. Spoke with nurse who states they do not have record of treating him.  Called patient to see if he was treated for TB back in 2019. If so will need to know where he was treated for records. Leatrice Jewels, RMA

## 2022-05-12 NOTE — Telephone Encounter (Signed)
See result note. His x-ray just came back today.

## 2022-05-14 ENCOUNTER — Other Ambulatory Visit: Payer: Self-pay | Admitting: Family Medicine

## 2022-05-14 DIAGNOSIS — R0781 Pleurodynia: Secondary | ICD-10-CM

## 2022-05-14 MED ORDER — OXYCODONE-ACETAMINOPHEN 7.5-325 MG PO TABS: 1.0000 | ORAL_TABLET | Freq: Four times a day (QID) | ORAL | 0 refills | Status: AC | PRN

## 2022-05-14 NOTE — Telephone Encounter (Signed)
Patient called about the status of his oxyCODONE-acetaminophen (PERCOCET) 7.5-325 MG tablet.

## 2022-05-24 ENCOUNTER — Other Ambulatory Visit: Payer: Self-pay | Admitting: Family Medicine

## 2022-05-24 DIAGNOSIS — M545 Low back pain, unspecified: Secondary | ICD-10-CM

## 2022-05-24 DIAGNOSIS — M5136 Other intervertebral disc degeneration, lumbar region: Secondary | ICD-10-CM

## 2022-05-25 ENCOUNTER — Telehealth: Payer: Self-pay

## 2022-05-25 DIAGNOSIS — G8929 Other chronic pain: Secondary | ICD-10-CM

## 2022-05-25 NOTE — Telephone Encounter (Signed)
Can you call him and see how his rib pain is doing? Is that what he is requesting the refills for? Or is it more for his chronic pain?

## 2022-05-25 NOTE — Telephone Encounter (Signed)
Patient states he needs a refill for either his traMADol (ULTRAM) 50 MG tablet or percocet 7.5 MG.  Patient states he put the request in at the pharmacy for both, but Dr. [name] may only want to fill one and that is fine.  Patient states he would prefer the percocet if he has a choice.  *Patient states his preferred pharmacy is [facility].

## 2022-05-25 NOTE — Telephone Encounter (Signed)
I called and LVM for the patient to call back. Justin Patel,cma   

## 2022-05-26 MED ORDER — TRAMADOL HCL 50 MG PO TABS: 50.0000 mg | ORAL_TABLET | Freq: Three times a day (TID) | ORAL | 0 refills | Status: DC | PRN

## 2022-05-26 NOTE — Telephone Encounter (Signed)
Noted.  I will refill his tramadol.

## 2022-05-26 NOTE — Telephone Encounter (Signed)
I called and spoke with the patient and he stated his pain is from the ribs, the pain is the same as it was the day he hurt himself. He stated he would prefer the tramadol over the percocet.  He also wanted you to know that in the middle of his back where the fracture is the pain is there and radiating down to his rt leg and he will discuss this with Dr. Annette Stable when he sees him. Tory Mckissack,cma

## 2022-05-26 NOTE — Telephone Encounter (Signed)
I called and informed the patient that the tramadol was sent and he understood.  Terah Robey,cma

## 2022-06-01 ENCOUNTER — Telehealth: Payer: Self-pay | Admitting: Pharmacist

## 2022-06-01 NOTE — Progress Notes (Signed)
[facility])                                            [place] Team    06/01/2022  Justin Patel Nov 09, 1950 [id number]  Called patient regarding medication assistance.  HIPAA identifiers were obtained.  Patient said he was not interested in pursuing patient assistance this year.  Although Wilder Glade is still on his medication list, patient reported that it has been discontinued due to candidal intertrigo.  Patient also did not want to provide financial information.    We will gladly reopen the patient assistance case in the future should the need arise.    Elayne Guerin, PharmD, [place] Clinical Pharmacist [phone]

## 2022-06-04 ENCOUNTER — Other Ambulatory Visit: Payer: Self-pay | Admitting: Family Medicine

## 2022-06-04 DIAGNOSIS — R600 Localized edema: Secondary | ICD-10-CM

## 2022-06-11 ENCOUNTER — Other Ambulatory Visit: Payer: Self-pay | Admitting: Family Medicine

## 2022-06-11 DIAGNOSIS — I10 Essential (primary) hypertension: Secondary | ICD-10-CM

## 2022-06-15 ENCOUNTER — Other Ambulatory Visit: Payer: Self-pay | Admitting: Family Medicine

## 2022-06-21 ENCOUNTER — Telehealth: Payer: Self-pay | Admitting: Family Medicine

## 2022-06-21 ENCOUNTER — Encounter: Payer: Self-pay | Admitting: Internal Medicine

## 2022-06-21 DIAGNOSIS — H353131 Nonexudative age-related macular degeneration, bilateral, early dry stage: Secondary | ICD-10-CM | POA: Diagnosis not present

## 2022-06-21 DIAGNOSIS — Z6828 Body mass index (BMI) 28.0-28.9, adult: Secondary | ICD-10-CM | POA: Diagnosis not present

## 2022-06-21 DIAGNOSIS — Z0100 Encounter for examination of eyes and vision without abnormal findings: Secondary | ICD-10-CM | POA: Diagnosis not present

## 2022-06-21 DIAGNOSIS — M5414 Radiculopathy, thoracic region: Secondary | ICD-10-CM | POA: Diagnosis not present

## 2022-06-21 DIAGNOSIS — M4326 Fusion of spine, lumbar region: Secondary | ICD-10-CM | POA: Diagnosis not present

## 2022-06-21 DIAGNOSIS — F1120 Opioid dependence, uncomplicated: Secondary | ICD-10-CM | POA: Diagnosis not present

## 2022-06-21 LAB — HM DIABETES EYE EXAM

## 2022-06-21 NOTE — Telephone Encounter (Signed)
Copied from [place] [id number]. Topic: Medicare AWV >> Jun 21, 2022  9:13 AM Devoria Glassing wrote: Reason for CRM: Left message for patient to schedule Annual Wellness Visit.  Please schedule with Nurse Health Advisor Denisa O'Brien-Blaney, LPN at [facility]. This appt can be telephone or office visit.  Please call [phone] ask for [facility]

## 2022-06-23 ENCOUNTER — Ambulatory Visit (INDEPENDENT_AMBULATORY_CARE_PROVIDER_SITE_OTHER): Payer: Medicare PPO | Admitting: Dermatology

## 2022-06-23 DIAGNOSIS — T300 Burn of unspecified body region, unspecified degree: Secondary | ICD-10-CM

## 2022-06-23 DIAGNOSIS — L578 Other skin changes due to chronic exposure to nonionizing radiation: Secondary | ICD-10-CM | POA: Diagnosis not present

## 2022-06-23 DIAGNOSIS — L821 Other seborrheic keratosis: Secondary | ICD-10-CM

## 2022-06-23 DIAGNOSIS — Z86018 Personal history of other benign neoplasm: Secondary | ICD-10-CM

## 2022-06-23 DIAGNOSIS — L814 Other melanin hyperpigmentation: Secondary | ICD-10-CM | POA: Diagnosis not present

## 2022-06-23 DIAGNOSIS — D229 Melanocytic nevi, unspecified: Secondary | ICD-10-CM

## 2022-06-23 DIAGNOSIS — Z1283 Encounter for screening for malignant neoplasm of skin: Secondary | ICD-10-CM

## 2022-06-23 DIAGNOSIS — T2102XA Burn of unspecified degree of abdominal wall, initial encounter: Secondary | ICD-10-CM | POA: Diagnosis not present

## 2022-06-23 NOTE — Patient Instructions (Signed)
Due to recent changes in healthcare laws, you may see results of your pathology and/or laboratory studies on MyChart before the doctors have had a chance to review them. We understand that in some cases there may be results that are confusing or concerning to you. Please understand that not all results are received at the same time and often the doctors may need to interpret multiple results in order to provide you with the best plan of care or course of treatment. Therefore, we ask that you please give us 2 business days to thoroughly review all your results before contacting the office for clarification. Should we see a critical lab result, you will be contacted sooner.   If You Need Anything After Your Visit  If you have any questions or concerns for your doctor, please call our main line at [phone] and press option 4 to reach your doctor's medical assistant. If no one answers, please leave a voicemail as directed and we will return your call as soon as possible. Messages left after 4 pm will be answered the following business day.   You may also send us a message via MyChart. We typically respond to MyChart messages within 1-2 business days.  For prescription refills, please ask your pharmacy to contact our office. Our fax number is [phone].  If you have an urgent issue when the clinic is closed that cannot wait until the next business day, you can page your doctor at the number below.    Please note that while we do our best to be available for urgent issues outside of office hours, we are not available 24/7.   If you have an urgent issue and are unable to reach us, you may choose to seek medical care at your doctor's office, retail clinic, urgent care center, or emergency room.  If you have a medical emergency, please immediately call 911 or go to the emergency department.  Pager Numbers  - Dr. Kowalski: [phone]  - Dr. Moye: [phone]  - Dr. Stewart:  [phone]  In the event of inclement weather, please call our main line at [phone] for an update on the status of any delays or closures.  Dermatology Medication Tips: Please keep the boxes that topical medications come in in order to help keep track of the instructions about where and how to use these. Pharmacies typically print the medication instructions only on the boxes and not directly on the medication tubes.   If your medication is too expensive, please contact our office at [phone] option 4 or send us a message through MyChart.   We are unable to tell what your co-pay for medications will be in advance as this is different depending on your insurance coverage. However, we may be able to find a substitute medication at lower cost or fill out paperwork to get insurance to cover a needed medication.   If a prior authorization is required to get your medication covered by your insurance company, please allow us 1-2 business days to complete this process.  Drug prices often vary depending on where the prescription is filled and some pharmacies may offer cheaper prices.  The website www.goodrx.com contains coupons for medications through different pharmacies. The prices here do not account for what the cost may be with help from insurance (it may be cheaper with your insurance), but the website can give you the price if you did not use any insurance.  - You can print the associated coupon and take it with   your prescription to the pharmacy.  - You may also stop by our office during regular business hours and pick up a GoodRx coupon card.  - If you need your prescription sent electronically to a different pharmacy, notify our office through [place] MyChart or by phone at [phone] option 4.     Si Usted Necesita Algo Despus de Su Visita  Tambin puede enviarnos un mensaje a travs de MyChart. Por lo general respondemos a los mensajes de MyChart en el transcurso de 1 a 2  das hbiles.  Para renovar recetas, por favor pida a su farmacia que se ponga en contacto con nuestra oficina. Nuestro nmero de fax es el [phone].  Si tiene un asunto urgente cuando la clnica est cerrada y que no puede esperar hasta el siguiente da hbil, puede llamar/localizar a su doctor(a) al nmero que aparece a continuacin.   Por favor, tenga en cuenta que aunque hacemos todo lo posible para estar disponibles para asuntos urgentes fuera del horario de oficina, no estamos disponibles las 24 horas del da, los 7 das de la semana.   Si tiene un problema urgente y no puede comunicarse con nosotros, puede optar por buscar atencin mdica  en el consultorio de su doctor(a), en una clnica privada, en un centro de atencin urgente o en una sala de emergencias.  Si tiene una emergencia mdica, por favor llame inmediatamente al 911 o vaya a la sala de emergencias.  Nmeros de bper  - Dr. Kowalski: [phone]  - Dra. Moye: [phone]  - Dra. Stewart: [phone]  En caso de inclemencias del tiempo, por favor llame a nuestra lnea principal al [phone] para una actualizacin sobre el estado de cualquier retraso o cierre.  Consejos para la medicacin en dermatologa: Por favor, guarde las cajas en las que vienen los medicamentos de uso tpico para ayudarle a seguir las instrucciones sobre dnde y cmo usarlos. Las farmacias generalmente imprimen las instrucciones del medicamento slo en las cajas y no directamente en los tubos del medicamento.   Si su medicamento es muy caro, por favor, pngase en contacto con nuestra oficina llamando al [phone] y presione la opcin 4 o envenos un mensaje a travs de MyChart.   No podemos decirle cul ser su copago por los medicamentos por adelantado ya que esto es diferente dependiendo de la cobertura de su seguro. Sin embargo, es posible que podamos encontrar un medicamento sustituto a menor costo o llenar un formulario para que el  seguro cubra el medicamento que se considera necesario.   Si se requiere una autorizacin previa para que su compaa de seguros cubra su medicamento, por favor permtanos de 1 a 2 das hbiles para completar este proceso.  Los precios de los medicamentos varan con frecuencia dependiendo del lugar de dnde se surte la receta y alguna farmacias pueden ofrecer precios ms baratos.  El sitio web www.goodrx.com tiene cupones para medicamentos de diferentes farmacias. Los precios aqu no tienen en cuenta lo que podra costar con la ayuda del seguro (puede ser ms barato con su seguro), pero el sitio web puede darle el precio si no utiliz ningn seguro.  - Puede imprimir el cupn correspondiente y llevarlo con su receta a la farmacia.  - Tambin puede pasar por nuestra oficina durante el horario de atencin regular y recoger una tarjeta de cupones de GoodRx.  - Si necesita que su receta se enve electrnicamente a una farmacia diferente, informe a nuestra oficina a travs de MyChart de [place]   o por telfono llamando al [phone] y presione la opcin 4.  
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
## 2022-06-25 ENCOUNTER — Inpatient Hospital Stay: Payer: Medicare PPO

## 2022-06-25 ENCOUNTER — Encounter: Payer: Self-pay | Admitting: Nurse Practitioner

## 2022-06-25 ENCOUNTER — Inpatient Hospital Stay: Payer: Medicare PPO | Attending: Internal Medicine | Admitting: Nurse Practitioner

## 2022-06-25 VITALS — BP 122/81 | HR 62 | Temp 96.0°F | Ht 65.0 in | Wt 178.0 lb

## 2022-06-25 DIAGNOSIS — E611 Iron deficiency: Secondary | ICD-10-CM | POA: Diagnosis not present

## 2022-06-25 DIAGNOSIS — N189 Chronic kidney disease, unspecified: Secondary | ICD-10-CM | POA: Insufficient documentation

## 2022-06-25 DIAGNOSIS — Z9884 Bariatric surgery status: Secondary | ICD-10-CM | POA: Diagnosis not present

## 2022-06-25 DIAGNOSIS — I130 Hypertensive heart and chronic kidney disease with heart failure and stage 1 through stage 4 chronic kidney disease, or unspecified chronic kidney disease: Secondary | ICD-10-CM | POA: Insufficient documentation

## 2022-06-25 DIAGNOSIS — I509 Heart failure, unspecified: Secondary | ICD-10-CM | POA: Diagnosis not present

## 2022-06-25 DIAGNOSIS — Z808 Family history of malignant neoplasm of other organs or systems: Secondary | ICD-10-CM | POA: Diagnosis not present

## 2022-06-25 DIAGNOSIS — Z807 Family history of other malignant neoplasms of lymphoid, hematopoietic and related tissues: Secondary | ICD-10-CM | POA: Insufficient documentation

## 2022-06-25 DIAGNOSIS — Z8042 Family history of malignant neoplasm of prostate: Secondary | ICD-10-CM | POA: Diagnosis not present

## 2022-06-25 DIAGNOSIS — M549 Dorsalgia, unspecified: Secondary | ICD-10-CM | POA: Diagnosis not present

## 2022-06-25 DIAGNOSIS — G8929 Other chronic pain: Secondary | ICD-10-CM | POA: Insufficient documentation

## 2022-06-25 DIAGNOSIS — Z801 Family history of malignant neoplasm of trachea, bronchus and lung: Secondary | ICD-10-CM | POA: Insufficient documentation

## 2022-06-25 DIAGNOSIS — R5382 Chronic fatigue, unspecified: Secondary | ICD-10-CM | POA: Insufficient documentation

## 2022-06-25 DIAGNOSIS — E538 Deficiency of other specified B group vitamins: Secondary | ICD-10-CM | POA: Diagnosis not present

## 2022-06-25 DIAGNOSIS — Z8041 Family history of malignant neoplasm of ovary: Secondary | ICD-10-CM | POA: Diagnosis not present

## 2022-06-25 DIAGNOSIS — K909 Intestinal malabsorption, unspecified: Secondary | ICD-10-CM | POA: Insufficient documentation

## 2022-06-25 DIAGNOSIS — D649 Anemia, unspecified: Secondary | ICD-10-CM | POA: Diagnosis not present

## 2022-06-25 LAB — BASIC METABOLIC PANEL
Anion gap: 8 (ref 5–15)
BUN: 26 mg/dL — ABNORMAL HIGH (ref 8–23)
CO2: 28 mmol/L (ref 22–32)
Calcium: 9.2 mg/dL (ref 8.9–10.3)
Chloride: 100 mmol/L (ref 98–111)
Creatinine, Ser: 0.82 mg/dL (ref 0.61–1.24)
GFR, Estimated: >60 mL/min (ref >60)
Glucose, Bld: 180 mg/dL — ABNORMAL HIGH (ref 70–99)
Potassium: 4.1 mmol/L (ref 3.5–5.1)
Sodium: 136 mmol/L (ref 135–145)

## 2022-06-25 LAB — CBC WITH DIFFERENTIAL/PLATELET
Abs Immature Granulocytes: 0.03 10*3/uL (ref 0.00–0.07)
Basophils Absolute: 0.1 10*3/uL (ref 0.0–0.1)
Basophils Relative: 1 %
Eosinophils Absolute: 0.2 10*3/uL (ref 0.0–0.5)
Eosinophils Relative: 2 %
HCT: 39.6 % (ref 39.0–52.0)
Hemoglobin: 12.7 g/dL — ABNORMAL LOW (ref 13.0–17.0)
Immature Granulocytes: 0 %
Lymphocytes Relative: 21 %
Lymphs Abs: 1.6 10*3/uL (ref 0.7–4.0)
MCH: 32.6 pg (ref 26.0–34.0)
MCHC: 32.1 g/dL (ref 30.0–36.0)
MCV: 101.5 fL — ABNORMAL HIGH (ref 80.0–100.0)
Monocytes Absolute: 0.4 10*3/uL (ref 0.1–1.0)
Monocytes Relative: 6 %
Neutro Abs: 5.3 10*3/uL (ref 1.7–7.7)
Neutrophils Relative %: 70 %
Platelets: 238 10*3/uL (ref 150–400)
RBC: 3.9 MIL/uL — ABNORMAL LOW (ref 4.22–5.81)
RDW: 16.2 % — ABNORMAL HIGH (ref 11.5–15.5)
WBC: 7.6 10*3/uL (ref 4.0–10.5)
nRBC: 0 % (ref 0.0–0.2)

## 2022-06-25 LAB — VITAMIN D 25 HYDROXY (VIT D DEFICIENCY, FRACTURES): Vit D, 25-Hydroxy: 41.35 ng/mL (ref 30–100)

## 2022-06-25 LAB — VITAMIN B12: Vitamin B-12: 225 pg/mL (ref 180–914)

## 2022-06-25 MED ORDER — SODIUM CHLORIDE 0.9 % IV SOLN
200.0000 mg | Freq: Once | INTRAVENOUS | Status: AC
  Administered 2022-06-25: 200 mg via INTRAVENOUS
  Filled 2022-06-25: qty 200

## 2022-06-25 MED ORDER — SODIUM CHLORIDE 0.9 % IV SOLN
Freq: Once | INTRAVENOUS | Status: AC
  Filled 2022-06-25: qty 250

## 2022-06-25 NOTE — Patient Instructions (Signed)

## 2022-06-25 NOTE — Progress Notes (Signed)
Pt declines to stay for post Venofer watch period.  Verbalized understanding of policy.

## 2022-06-25 NOTE — Progress Notes (Signed)
[facility] NOTE  Patient Care Team: [name], MD as PCP - General (Family Medicine) Pieter Partridge, DO as Consulting Physician (Neurology) Cammie Sickle, MD as Consulting Physician (Oncology)  CHIEF COMPLAINTS/PURPOSE OF CONSULTATION: ANEMIA   HEMATOLOGY HISTORY  # ANEMIA [Hb; MCV-platelets- WBC; Iron sat; ferritin;  GFR- CT/US; FEB 2023- EGD [[facility]]/- Evidence of a Roux-en-Y gastrojejunostomy was found. The gastrojejunal anastomosis was characterized by healthy appearing mucosa. The jejunojejunal anastomosis was characterized by healthy appearing mucosa. Previous gastric ulcer has healed. 2019-Colonoscopy   Latest Reference Range & Units 01/05/22 11:13  TIBC 250.0 - 450.0 mcg/dL 533.4 (H)  Saturation Ratios 20.0 - 50.0 % 10.3 (L)  Ferritin 22.0 - 322.0 ng/mL 12.5 (L)  Transferrin 212.0 - 360.0 mg/dL 381.0 (H)  (H): Data is abnormally high (L): Data is abnormally low  # Leucocytosis/weight loss/extreme fatigue-bone marrow biopsy-June 2019-unremarkable.   #Mild macrocytosis without anemia-question copper deficiency/gastric bypass; recommend p.o. copper.   #  SDH [[place]- 5809]; gastric Bypass Surgery    Latest Reference Range & Units 01/05/22 11:13  TIBC 250.0 - 450.0 mcg/dL 533.4 (H)  Saturation Ratios 20.0 - 50.0 % 10.3 (L)  Ferritin 22.0 - 322.0 ng/mL 12.5 (L)  Transferrin 212.0 - 360.0 mg/dL 381.0 (H)  (H): Data is abnormally high (L): Data is abnormally low   No history exists.    Latest Reference Range & Units 01/05/22 11:13  WBC 4.0 - 10.5 K/uL 8.4  RBC 4.22 - 5.81 Mil/uL 4.19 (L)  Hemoglobin 13.0 - 17.0 g/dL 12.3 (L)  HCT 39.0 - 52.0 % 38.3 (L)  MCV 78.0 - 100.0 fl 91.4  MCHC 30.0 - 36.0 g/dL 32.0  RDW 11.5 - 15.5 % 16.7 (H)  Platelets 150.0 - 400.0 K/uL 207.0  (L): Data is abnormally low (H): Data is abnormally high  HISTORY OF PRESENTING ILLNESS: Patient ambulating dependently.  Alone.   [name] 71 y.o.   male pleasant patient with history of gastric bypass and anemia who returns to clinic for follow up. He reports fatigue and generalized malaise. Says he suspects his iron is low. Denies any neurologic complaints. Denies recent fevers or illnesses. Denies any easy bleeding or bruising. No melena or hematochezia. No pica or restless leg. Reports good appetite and denies weight loss. Denies chest pain. Denies any nausea, vomiting, constipation, or diarrhea. Denies urinary complaints. Patient offers no further specific complaints today.  Review of Systems  Constitutional:  Positive for malaise/fatigue. Negative for chills, diaphoresis, fever and weight loss.  HENT:  Negative for nosebleeds and sore throat.   Eyes:  Negative for double vision.  Respiratory:  Negative for cough, hemoptysis, sputum production, shortness of breath and wheezing.   Cardiovascular:  Negative for chest pain, palpitations, orthopnea and leg swelling.  Gastrointestinal:  Negative for abdominal pain, blood in stool, constipation, diarrhea, heartburn, melena, nausea and vomiting.  Genitourinary:  Negative for dysuria, frequency and urgency.  Musculoskeletal:  Positive for back pain and joint pain.  Skin: Negative.  Negative for itching and rash.  Neurological:  Negative for dizziness, tingling, focal weakness, weakness and headaches.  Endo/Heme/Allergies:  Does not bruise/bleed easily.  Psychiatric/Behavioral:  Negative for depression. The patient is not nervous/anxious and does not have insomnia.    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  removal scheduled to clear margins.   CHF (congestive heart failure) (HCC)    no longer after weight loss   Chronic kidney disease    cysts   Degenerative disc disease    Depression    Diabetes mellitus    no  longer diabetic-or on meds   DJD (degenerative joint disease)    Dysplastic nevus 02/04/2016   R upper back paraspinal - severe   Family history of adverse reaction to anesthesia    sons wake up combative   Foot fracture, left 01/2022   Gastric ulcer    GERD (gastroesophageal reflux disease)    H/O hiatal hernia    Headache(784.0)    sinus   Heart murmur    Hx of congestive heart failure    Hyperlipidemia    Hypertension    Iron deficiency anemia 02/28/2015   Lumbar scoliosis    Opiate abuse, continuous ([place]) 06/20/2015   Sacral fracture, closed ([place])    Seizures ([place])    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, after GI bleed   Sleep apnea    hx not now since wt loss   Stones in the urinary tract    Syncope and collapse    SURGICAL HISTORY: Past Surgical History:  Procedure Laterality Date   ANTERIOR CERVICAL DECOMP/DISCECTOMY FUSION  01/26/2012   Procedure: ANTERIOR CERVICAL DECOMPRESSION/DISCECTOMY FUSION 3 LEVELS;  Surgeon: Floyce Stakes, MD;  Location: [facility];  Service: Neurosurgery;  Laterality: N/A;  Cervical three-four Cervical four-five Cervical five-six Cervical six-seven , Anterior cervical decompression/diskectomy, fusion, plate   ANTERIOR LAT LUMBAR FUSION Right 04/25/2018   Procedure: Right Lumbar two-three Lumbar three-four Lumbar four-five anterior  lateral interbody fusion  with posterior percutaneous pedicle screws;  Surgeon: Erline Levine, MD;  Location: [facility];  Service: Neurosurgery;  Laterality: Right;   [place], normal   CARDIAC CATHETERIZATION     [place]     COLONOSCOPY WITH PROPOFOL N/A 12/27/2017   Procedure: COLONOSCOPY WITH PROPOFOL;  Surgeon: Toledo, Benay Pike, MD;  Location: [facility];  Service: Gastroenterology;  Laterality: N/A;   ESOPHAGOGASTRODUODENOSCOPY (EGD) WITH PROPOFOL N/A 12/27/2017   Procedure: ESOPHAGOGASTRODUODENOSCOPY (EGD) WITH PROPOFOL;   Surgeon: Toledo, Benay Pike, MD;  Location: [facility];  Service: Gastroenterology;  Laterality: N/A;   ESOPHAGOGASTRODUODENOSCOPY (EGD) WITH PROPOFOL N/A 06/19/2021   Procedure: ESOPHAGOGASTRODUODENOSCOPY (EGD) WITH PROPOFOL;  Surgeon: [name], MD;  Location: [facility];  Service: Gastroenterology;  Laterality: N/A;   ESOPHAGOGASTRODUODENOSCOPY (EGD) WITH PROPOFOL N/A 09/25/2021   Procedure: ESOPHAGOGASTRODUODENOSCOPY (EGD) WITH PROPOFOL;  Surgeon: [name], MD;  Location: [facility];  Service: Endoscopy;  Laterality: N/A;   GASTRIC BYPASS  08/09/2008   [employer]   HARDWARE REMOVAL N/A 03/08/2022   Procedure: Removal of bilateral Thoracic ten pedicle screws;  Surgeon: Earnie Larsson, MD;  Location: [facility];  Service: Neurosurgery;  Laterality: N/A;   HERNIA REPAIR  08/09/1998   hiatal   JOINT REPLACEMENT     KNEE ARTHROSCOPY     bilateral, left x 2   LAMINECTOMY WITH POSTERIOR LATERAL ARTHRODESIS LEVEL 4 N/A 01/14/2020   Procedure: Decompressive Laminectomy Lumbar One with pedicle screw fixation from Thoracic Ten to Lumbar Two;  Surgeon: Erline Levine, MD;  Location: [facility];  Service: Neurosurgery;  Laterality: N/A;  Decompressive Laminectomy Lumbar One with pedicle screw fixation from Thoracic Ten to Lumbar Two  LUMBAR PERCUTANEOUS PEDICLE SCREW 3 LEVEL N/A 04/25/2018   Procedure: LUMBAR PERCUTANEOUS PEDICLE SCREW 3 LEVEL;  Surgeon: Erline Levine, MD;  Location: [facility];  Service: Neurosurgery;  Laterality: N/A;   NASAL SINUS SURGERY     x5   OSTEOTOMY  08/10/1999   left   ROUX-EN-Y GASTRIC BYPASS     THORACIC LAMINECTOMY FOR EPIDURAL ABSCESS N/A 03/28/2022   Procedure: THORACIC WOUND WASHOUT;  Surgeon: Eustace Moore, MD;  Location: [facility];  Service: Neurosurgery;  Laterality: N/A;   TONSILLECTOMY     TOTAL KNEE ARTHROPLASTY Left 05/09/2014   Dr. Marry Guan   UVULOPALATOPLASTY  08/09/2009   SOCIAL HISTORY: Social History   Socioeconomic History   Marital  status: Married    Spouse name: Not on file   Number of children: 2   Years of education: Not on file   Highest education level: Not on file  Occupational History   Not on file  Tobacco Use   Smoking status: Never   Smokeless tobacco: Never  Vaping Use   Vaping Use: Never used  Substance and Sexual Activity   Alcohol use: Yes    Comment: occassionally   Drug use: No   Sexual activity: Yes    Partners: Female  Other Topics Concern   Not on file  Social History Narrative   Lives in [place] with wife, [place]. 2 sons, William Hamburger, Lennette Bihari 45.. 4 grandchildren      Work - Retired, previously taught music in [place] - regular diet, limited quantities after gastric bypass      Exercise - no regular, limited by arthritis in knees, occasional water aerobics   Right handed   One story house   Social Determinants of Health   Financial Resource Strain: Medium Risk (03/09/2022)   Overall Financial Resource Strain (CARDIA)    Difficulty of Paying Living Expenses: Somewhat hard  Food Insecurity: No Food Insecurity (04/07/2022)   Hunger Vital Sign    Worried About Running Out of Food in the Last Year: Never true    Ran Out of Food in the Last Year: Never true  Transportation Needs: No Transportation Needs (04/07/2022)   PRAPARE - Hydrologist (Medical): No    Lack of Transportation (Non-Medical): No  Physical Activity: Unknown (06/22/2021)   Exercise Vital Sign    Days of Exercise per Week: 0 days    Minutes of Exercise per Session: Not on file  Stress: No Stress Concern Present (06/22/2021)   [place]    Feeling of Stress : Not at all  Social Connections: Unknown (06/22/2021)   Social Connection and Isolation Panel [NHANES]    Frequency of Communication with Friends and Family: Not on file    Frequency of Social Gatherings with Friends and Family: More than  three times a week    Attends Religious Services: Not on file    Active Member of Clubs or Organizations: Not on file    Attends Archivist Meetings: Not on file    Marital Status: Married  Intimate Partner Violence: Not At Risk (06/22/2021)   Humiliation, Afraid, Rape, and Kick questionnaire    Fear of Current or Ex-Partner: No    Emotionally Abused: No    Physically Abused: No    Sexually Abused: No   FAMILY HISTORY: Family History  Problem Relation Age of Onset   Dementia Mother 39  Osteoporosis Mother    Heart disease Father 6   Diabetes Father    Lymphoma Sister        lymphoma, stage 4   Ovarian cancer Sister 29       Ovarian   Lupus Sister    Prostate cancer Maternal Uncle    Skin cancer Maternal Uncle    Tongue cancer Maternal Uncle        uncle died of heart attack   Alcoholism Maternal Uncle    Lung cancer Paternal Aunt        pat aunts x 4 died of lung cancer   Lung cancer Paternal Aunt    Lung cancer Paternal Aunt    Lung cancer Paternal Aunt    Mesothelioma Cousin        paternal cousin   ALLERGIES:  is allergic to altace [ramipril], ace inhibitors, levaquin [levofloxacin in d5w], lyrica [pregabalin], metformin, and trulicity [dulaglutide].  MEDICATIONS:  Current Outpatient Medications  Medication Sig Dispense Refill   acetaminophen (TYLENOL) 325 MG tablet Take 650 mg by mouth every 6 (six) hours as needed for moderate pain.     blood glucose meter kit and supplies KIT Dispense based on patient and insurance preference. Check once daily. ICD doing E11.9. 1 each 0   Calcium Carbonate-Vitamin D (CALCIUM 600/VITAMIN D PO) Take 1 tablet by mouth daily.     cyclobenzaprine (FLEXERIL) 5 MG tablet TAKE 1 TABLET BY MOUTH EVERY 8 HOURS AS NEEDED FOR SPASMS. 30 tablet 1   dapagliflozin propanediol (FARXIGA) 10 MG TABS tablet Take 1 tablet (10 mg total) by mouth daily before breakfast. 90 tablet 1   esomeprazole (NEXIUM) 40 MG capsule TAKE 1 CAPSULE BY  MOUTH ONCE DAILY 90 capsule 1   ezetimibe (ZETIA) 10 MG tablet TAKE ONE TABLET BY MOUTH EVERY DAY 90 tablet 1   furosemide (LASIX) 20 MG tablet TAKE 1 TABLET BY MOUTH DAILY 90 tablet 1   gabapentin (NEURONTIN) 300 MG capsule TAKE 1 CAPSULE BY MOUTH 3 TIMES DAILY. 270 capsule 1   ketoconazole (NIZORAL) 2 % cream Apply 1 Application topically daily. 15 g 0   metoprolol succinate (TOPROL-XL) 50 MG 24 hr tablet TAKE 1 TABLET BY MOUTH DAILY WITH OR IMMEDIATLY FOLLOWING A MEAL 90 tablet 1   Multiple Vitamin (MULTIVITAMIN) capsule Take 1 capsule by mouth daily. With copper and zinc     nystatin-triamcinolone ointment (MYCOLOG) Apply 1 application topically 2 (two) times daily. (Patient taking differently: Apply 1 application  topically daily as needed (irritation).) 30 g 0   ondansetron (ZOFRAN) 4 MG tablet Take 1 tablet (4 mg total) by mouth daily as needed for nausea or vomiting. 30 tablet 1   primidone (MYSOLINE) 50 MG tablet Take 1 tablet (50 mg total) by mouth at bedtime. 30 tablet 11   rosuvastatin (CRESTOR) 40 MG tablet TAKE ONE TABLET EVERY DAY 90 tablet 0   sucralfate (CARAFATE) 1 g tablet TAKE 1 TABLET BY MOUTH 4 TIMES DAILY 360 tablet 1   tamsulosin (FLOMAX) 0.4 MG CAPS capsule TAKE 1 CAPSULE BY MOUTH EVERY DAY 90 capsule 3   TRADJENTA 5 MG TABS tablet TAKE 1 TABLET BY MOUTH DAILY 90 tablet 1   traMADol (ULTRAM) 50 MG tablet Take 1-2 tablets (50-100 mg total) by mouth every 8 (eight) hours as needed. 180 tablet 0   venlafaxine XR (EFFEXOR-XR) 75 MG 24 hr capsule TAKE ONE CAPSULE EVERY MORNING WITH BREAKFAST 90 capsule 1   No current facility-administered medications for this  visit.   PHYSICAL EXAMINATION: Vitals:   06/25/22 1346  BP: 122/81  Pulse: 62  Temp: (!) 96 F (35.6 C)   Filed Weights   06/25/22 1346  Weight: 178 lb (80.7 kg)    Physical Exam Vitals and nursing note reviewed.  Constitutional:      Appearance: He is not ill-appearing.  HENT:     Head: Normocephalic and  atraumatic.  Cardiovascular:     Rate and Rhythm: Normal rate and regular rhythm.  Pulmonary:     Comments: Decreased breath sounds bilaterally.  Abdominal:     General: There is no distension.     Palpations: Abdomen is soft.  Musculoskeletal:        General: Normal range of motion.  Skin:    General: Skin is warm.     Coloration: Skin is pale.  Neurological:     General: No focal deficit present.     Mental Status: He is alert and oriented to person, place, and time.  Psychiatric:        Behavior: Behavior normal.        Judgment: Judgment normal.      LABORATORY DATA:  I have reviewed the data as listed Lab Results  Component Value Date   WBC 7.6 06/25/2022   HGB 12.7 (L) 06/25/2022   HCT 39.6 06/25/2022   MCV 101.5 (H) 06/25/2022   PLT 238 06/25/2022   Recent Labs    03/26/22 1051 03/27/22 1338 03/29/22 0140 03/30/22 0132 04/20/22 1035 06/25/22 1326  NA 138   < > 138 138 139 136  K 3.5   < > 3.7 3.7 4.5 4.1  CL 105   < > 104 107 104 100  CO2 23   < > _0 GLUCOSE 149*   < > 131* 111* 118* 180*  BUN 18   < > _1 26*  CREATININE 1.13   < > 1.10 1.07 1.10 0.82  CALCIUM 9.3   < > 8.5* 8.1* 10.3 9.2  GFRNONAA >60   < > >60 >60  --  >60  PROT 7.4  --  5.4*  --   --   --   ALBUMIN 3.4*  --  2.4*  --   --   --   AST 22  --  24  --   --   --   ALT 20  --  21  --   --   --   ALKPHOS 60  --  50  --   --   --   BILITOT 0.5  --  0.5  --   --   --    < > = values in this interval not displayed.   Iron/TIBC/Ferritin/ %Sat    Component Value Date/Time   IRON 55 01/05/2022 1113   IRON 104 12/06/2014 1331   TIBC 533.4 (H) 01/05/2022 1113   TIBC 302 12/06/2014 1331   FERRITIN 12.5 (L) 01/05/2022 1113   FERRITIN 120 12/06/2014 1331   IRONPCTSAT 10.3 (L) 01/05/2022 1113   IRONPCTSAT 34.5 12/06/2014 1331    No results found.  ASSESSMENT & PLAN:   No problem-specific Assessment & Plan notes found for this encounter.  # Iron deficiency: Secondary  to gastric bypass. May 2023 ferritin 12, iron sat 10%. Likely secondary to malabsorption d/t gastric bypass. S/p venofer, last 04/06/22. Hmg improved to 14. Now worse, Hemoglobin today 12.7.   # Chronic fatigue- worsened by malabsorption/gastric bypass. B12, Vit D,  Copper, and zinc are pending at time of visit.   # Chronic back pain-status post surgery clinically stable.   # DISPOSITION; Venofer x 3 3 mo- labs (cbc, cmp, ferritin, iron studies, b12), Dr Rogue Bussing, +/- venofer- la  All questions were answered. The patient knows to call the clinic with any problems, questions or concerns.   Verlon Au, NP 06/25/2022

## 2022-06-29 ENCOUNTER — Other Ambulatory Visit: Payer: Self-pay | Admitting: Cardiovascular Disease

## 2022-06-29 DIAGNOSIS — E119 Type 2 diabetes mellitus without complications: Secondary | ICD-10-CM

## 2022-06-30 ENCOUNTER — Inpatient Hospital Stay: Payer: Medicare PPO

## 2022-06-30 VITALS — BP 127/83 | HR 62 | Resp 16

## 2022-06-30 DIAGNOSIS — E538 Deficiency of other specified B group vitamins: Secondary | ICD-10-CM | POA: Diagnosis not present

## 2022-06-30 DIAGNOSIS — K909 Intestinal malabsorption, unspecified: Secondary | ICD-10-CM | POA: Diagnosis not present

## 2022-06-30 DIAGNOSIS — E611 Iron deficiency: Secondary | ICD-10-CM

## 2022-06-30 DIAGNOSIS — N189 Chronic kidney disease, unspecified: Secondary | ICD-10-CM | POA: Diagnosis not present

## 2022-06-30 DIAGNOSIS — R5382 Chronic fatigue, unspecified: Secondary | ICD-10-CM | POA: Diagnosis not present

## 2022-06-30 DIAGNOSIS — I509 Heart failure, unspecified: Secondary | ICD-10-CM | POA: Diagnosis not present

## 2022-06-30 DIAGNOSIS — G8929 Other chronic pain: Secondary | ICD-10-CM | POA: Diagnosis not present

## 2022-06-30 DIAGNOSIS — D649 Anemia, unspecified: Secondary | ICD-10-CM | POA: Diagnosis not present

## 2022-06-30 DIAGNOSIS — I130 Hypertensive heart and chronic kidney disease with heart failure and stage 1 through stage 4 chronic kidney disease, or unspecified chronic kidney disease: Secondary | ICD-10-CM | POA: Diagnosis not present

## 2022-06-30 MED ORDER — SODIUM CHLORIDE 0.9 % IV SOLN
200.0000 mg | Freq: Once | INTRAVENOUS | Status: AC
  Administered 2022-06-30: 200 mg via INTRAVENOUS
  Filled 2022-06-30: qty 200

## 2022-06-30 MED ORDER — CYANOCOBALAMIN 1000 MCG/ML IJ SOLN
1000.0000 ug | Freq: Once | INTRAMUSCULAR | Status: AC
  Administered 2022-06-30: 1000 ug via INTRAMUSCULAR
  Filled 2022-06-30: qty 1

## 2022-06-30 MED ORDER — SODIUM CHLORIDE 0.9 % IV SOLN
Freq: Once | INTRAVENOUS | Status: AC
  Filled 2022-06-30: qty 250

## 2022-06-30 NOTE — Progress Notes (Signed)
Pt tolerated treatment well.  VSS.  Pt refused 30 minute post observation.

## 2022-06-30 NOTE — Patient Instructions (Signed)

## 2022-07-02 ENCOUNTER — Other Ambulatory Visit: Payer: Self-pay | Admitting: Family

## 2022-07-02 DIAGNOSIS — E785 Hyperlipidemia, unspecified: Secondary | ICD-10-CM

## 2022-07-06 ENCOUNTER — Encounter: Payer: Self-pay | Admitting: Dermatology

## 2022-07-06 ENCOUNTER — Ambulatory Visit (INDEPENDENT_AMBULATORY_CARE_PROVIDER_SITE_OTHER): Payer: Medicare PPO

## 2022-07-06 VITALS — Ht 65.0 in | Wt 165.0 lb

## 2022-07-06 DIAGNOSIS — Z0000 Encounter for general adult medical examination without abnormal findings: Secondary | ICD-10-CM

## 2022-07-06 NOTE — Progress Notes (Signed)
Subjective:   Justin Patel is a 71 y.o. male who presents for Medicare Annual/Subsequent preventive examination.  Review of Systems    No ROS.  Medicare Wellness Virtual Visit.  Visual/audio telehealth visit, UTA vital signs.   See social history for additional risk factors.   Cardiac Risk Factors include: advanced age (>59mn, >>38women);male gender;hypertension;diabetes mellitus     Objective:    Today's Vitals   07/06/22 1140  Weight: 165 lb (74.8 kg)  Height: _0  (1.651 m)   Body mass index is 27.46 kg/m.     07/06/2022   11:43 AM 06/30/2022   12:55 PM 06/25/2022    2:52 PM 06/25/2022    1:43 PM 04/06/2022    2:56 PM 03/31/2022    7:00 PM 03/26/2022   10:50 AM  Advanced Directives  Does Patient Have a Medical Advance Directive? _1  Yes No  Type of [place]amed[place]of [place]nut[place] will     HSurry  Does patient want to make changes to medical advance directive? No - Patient declined  No - Patient declined  No - Patient declined No - Patient declined No - Patient declined  [place] of [place]? No - copy requested     No - copy requested   Would patient like information on creating a medical advance directive?  No - Patient declined    No - Patient declined No - Patient declined    Current Medications (verified) Outpatient Encounter Medications as of 07/06/2022  Medication Sig   acetaminophen (TYLENOL) 325 MG tablet Take 650 mg by mouth every 6 (six) hours as needed for moderate pain.   blood glucose meter kit and supplies KIT Dispense based on patient and insurance preference. Check once daily. ICD doing E11.9.   Calcium Carbonate-Vitamin D (CALCIUM 600/VITAMIN D PO) Take 1 tablet by mouth daily.   cyclobenzaprine (FLEXERIL) 5 MG tablet TAKE 1 TABLET BY MOUTH EVERY 8 HOURS AS NEEDED FOR SPASMS.   dapagliflozin propanediol (FARXIGA) 10 MG TABS tablet Take 1 tablet (10 mg total) by  mouth daily before breakfast.   esomeprazole (NEXIUM) 40 MG capsule TAKE 1 CAPSULE BY MOUTH ONCE DAILY   ezetimibe (ZETIA) 10 MG tablet TAKE ONE TABLET BY MOUTH EVERY DAY   furosemide (LASIX) 20 MG tablet TAKE 1 TABLET BY MOUTH DAILY   gabapentin (NEURONTIN) 300 MG capsule TAKE 1 CAPSULE BY MOUTH 3 TIMES DAILY.   ketoconazole (NIZORAL) 2 % cream Apply 1 Application topically daily.   metoprolol succinate (TOPROL-XL) 50 MG 24 hr tablet TAKE 1 TABLET BY MOUTH DAILY WITH OR IMMEDIATLY FOLLOWING A MEAL   Multiple Vitamin (MULTIVITAMIN) capsule Take 1 capsule by mouth daily. With copper and zinc   nystatin-triamcinolone ointment (MYCOLOG) Apply 1 application topically 2 (two) times daily. (Patient taking differently: Apply 1 application  topically daily as needed (irritation).)   ondansetron (ZOFRAN) 4 MG tablet Take 1 tablet (4 mg total) by mouth daily as needed for nausea or vomiting.   primidone (MYSOLINE) 50 MG tablet Take 1 tablet (50 mg total) by mouth at bedtime.   rosuvastatin (CRESTOR) 40 MG tablet TAKE ONE TABLET EVERY DAY   sucralfate (CARAFATE) 1 g tablet TAKE 1 TABLET BY MOUTH 4 TIMES DAILY   tamsulosin (FLOMAX) 0.4 MG CAPS capsule TAKE 1 CAPSULE BY MOUTH EVERY DAY   TRADJENTA 5 MG TABS tablet TAKE 1 TABLET BY MOUTH DAILY   traMADol (ULTRAM)  50 MG tablet Take 1-2 tablets (50-100 mg total) by mouth every 8 (eight) hours as needed.   venlafaxine XR (EFFEXOR-XR) 75 MG 24 hr capsule TAKE ONE CAPSULE EVERY MORNING WITH BREAKFAST   No facility-administered encounter medications on file as of 07/06/2022.    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  removal scheduled to clear  margins.   CHF (congestive heart failure) (HCC)    no longer after weight loss   Chronic kidney disease    cysts   Degenerative disc disease    Depression    Diabetes mellitus    no longer diabetic-or on meds   DJD (degenerative joint disease)    Dysplastic nevus 02/04/2016   R upper back paraspinal - severe   Family history of adverse reaction to anesthesia    sons wake up combative   Foot fracture, left 01/2022   Gastric ulcer    GERD (gastroesophageal reflux disease)    H/O hiatal hernia    Headache(784.0)    sinus   Heart murmur    Hx of congestive heart failure    Hyperlipidemia    Hypertension    Iron deficiency anemia 02/28/2015   Lumbar scoliosis    Opiate abuse, continuous ([place]) 06/20/2015   Sacral fracture, closed ([place])    Seizures ([place])    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, after GI bleed   Sleep apnea    hx not now since wt loss   Stones in the urinary tract    Syncope and collapse    Past Surgical History:  Procedure Laterality Date   ANTERIOR CERVICAL DECOMP/DISCECTOMY FUSION  01/26/2012   Procedure: ANTERIOR CERVICAL DECOMPRESSION/DISCECTOMY FUSION 3 LEVELS;  Surgeon: Floyce Stakes, MD;  Location: [facility];  Service: Neurosurgery;  Laterality: N/A;  Cervical three-four Cervical four-five Cervical five-six Cervical six-seven , Anterior cervical decompression/diskectomy, fusion, plate   ANTERIOR LAT LUMBAR FUSION Right 04/25/2018   Procedure: Right Lumbar two-three Lumbar three-four Lumbar four-five anterior  lateral interbody fusion  with posterior percutaneous pedicle screws;  Surgeon: Erline Levine, MD;  Location: [place];  Service: Neurosurgery;  Laterality: Right;   [place], normal   CARDIAC CATHETERIZATION     [place]     COLONOSCOPY WITH PROPOFOL N/A 12/27/2017   Procedure: COLONOSCOPY WITH PROPOFOL;  Surgeon: Toledo, Benay Pike, MD;  Location: [facility];   Service: Gastroenterology;  Laterality: N/A;   ESOPHAGOGASTRODUODENOSCOPY (EGD) WITH PROPOFOL N/A 12/27/2017   Procedure: ESOPHAGOGASTRODUODENOSCOPY (EGD) WITH PROPOFOL;  Surgeon: Toledo, Benay Pike, MD;  Location: [facility];  Service: Gastroenterology;  Laterality: N/A;   ESOPHAGOGASTRODUODENOSCOPY (EGD) WITH PROPOFOL N/A 06/19/2021   Procedure: ESOPHAGOGASTRODUODENOSCOPY (EGD) WITH PROPOFOL;  Surgeon: [name], MD;  Location: [facility];  Service: Gastroenterology;  Laterality: N/A;   ESOPHAGOGASTRODUODENOSCOPY (EGD) WITH PROPOFOL N/A 09/25/2021   Procedure: ESOPHAGOGASTRODUODENOSCOPY (EGD) WITH PROPOFOL;  Surgeon: [name], MD;  Location: [facility];  Service: Endoscopy;  Laterality: N/A;   GASTRIC BYPASS  08/09/2008   [employer]   HARDWARE REMOVAL N/A 03/08/2022   Procedure: Removal of bilateral Thoracic ten pedicle screws;  Surgeon:  Earnie Larsson, MD;  Location: [facility];  Service: Neurosurgery;  Laterality: N/A;   HERNIA REPAIR  08/09/1998   hiatal   JOINT REPLACEMENT     KNEE ARTHROSCOPY     bilateral, left x 2   LAMINECTOMY WITH POSTERIOR LATERAL ARTHRODESIS LEVEL 4 N/A 01/14/2020   Procedure: Decompressive Laminectomy Lumbar One with pedicle screw fixation from Thoracic Ten to Lumbar Two;  Surgeon: Erline Levine, MD;  Location: [place];  Service: Neurosurgery;  Laterality: N/A;  Decompressive Laminectomy Lumbar One with pedicle screw fixation from Thoracic Ten to Lumbar Two   LUMBAR PERCUTANEOUS PEDICLE SCREW 3 LEVEL N/A 04/25/2018   Procedure: LUMBAR PERCUTANEOUS PEDICLE SCREW 3 LEVEL;  Surgeon: Erline Levine, MD;  Location: [place];  Service: Neurosurgery;  Laterality: N/A;   NASAL SINUS SURGERY     x5   OSTEOTOMY  08/10/1999   left   ROUX-EN-Y GASTRIC BYPASS     THORACIC LAMINECTOMY FOR EPIDURAL ABSCESS N/A 03/28/2022   Procedure: THORACIC WOUND WASHOUT;  Surgeon: Eustace Moore, MD;  Location: [place];  Service: Neurosurgery;  Laterality: N/A;    TONSILLECTOMY     TOTAL KNEE ARTHROPLASTY Left 05/09/2014   Dr. Marry Guan   UVULOPALATOPLASTY  08/09/2009   Family History  Problem Relation Age of Onset   Dementia Mother 57   Osteoporosis Mother    Heart disease Father 19   Diabetes Father    Lymphoma Sister        lymphoma, stage 72   Ovarian cancer Sister 68       Ovarian   Lupus Sister    Prostate cancer Maternal Uncle    Skin cancer Maternal Uncle    Tongue cancer Maternal Uncle        uncle died of heart attack   Alcoholism Maternal Uncle    Lung cancer Paternal Aunt        pat aunts x 4 died of lung cancer   Lung cancer Paternal Aunt    Lung cancer Paternal Aunt    Lung cancer Paternal 46    Mesothelioma Cousin        paternal cousin   Social History   Socioeconomic History   Marital status: Married    Spouse name: Not on file   Number of children: 2   Years of education: Not on file   Highest education level: Not on file  Occupational History   Not on file  Tobacco Use   Smoking status: Never   Smokeless tobacco: Never  Vaping Use   Vaping Use: Never used  Substance and Sexual Activity   Alcohol use: Yes    Comment: occassionally   Drug use: No   Sexual activity: Yes    Partners: Female  Other Topics Concern   Not on file  Social History Narrative   Lives in [place] with wife, [place]. 2 sons, William Hamburger, Lennette Bihari 27.. 4 grandchildren      Work - Retired, previously taught music in [place] - regular diet, limited quantities after gastric bypass      Exercise - no regular, limited by arthritis in knees, occasional water aerobics   Right handed   One story house   Social Determinants of Health   Financial Resource Strain: Low Risk  (07/06/2022)   Overall Financial Resource Strain (CARDIA)    Difficulty of Paying Living Expenses: Not hard at all  Food Insecurity: No Food Insecurity (07/06/2022)   Hunger Vital Sign  Worried About Charity fundraiser in the Last Year:  Never true    [place] in the Last Year: Never true  Transportation Needs: No Transportation Needs (07/06/2022)   PRAPARE - Hydrologist (Medical): No    Lack of Transportation (Non-Medical): No  Physical Activity: Unknown (06/22/2021)   Exercise Vital Sign    Days of Exercise per Week: 0 days    Minutes of Exercise per Session: Not on file  Stress: No Stress Concern Present (07/06/2022)   [place]    Feeling of Stress : Not at all  Social Connections: Unknown (07/06/2022)   Social Connection and Isolation Panel [NHANES]    Frequency of Communication with Friends and Family: Not on file    Frequency of Social Gatherings with Friends and Family: More than three times a week    Attends Religious Services: Not on Advertising copywriter or Organizations: Not on file    Attends Archivist Meetings: Not on file    Marital Status: Married    Tobacco Counseling Counseling given: Not Answered   Clinical Intake:  Pre-visit preparation completed: Yes        Diabetes: Yes (Followed by PCP)  Nutrition Risk Assessment: Has the patient had any N/V/D within the last 2 months?  No  Does the patient have any non-healing wounds?  No  Has the patient had any unintentional weight loss or weight gain?  No   Financial Strains and Diabetes Management: Are you having any financial strains with the device, your supplies or your medication? No .  Does the patient want to be seen by Chronic Care Management for management of their diabetes?  No  Would the patient like to be referred to a Nutritionist or for Diabetic Management?  No   How often do you need to have someone help you when you read instructions, pamphlets, or other written materials from your doctor or pharmacy?: 1 - Never    Interpreter Needed?: No    Activities of Daily Living    07/06/2022   11:45 AM  03/31/2022    3:00 PM  In your present state of health, do you have any difficulty performing the following activities:  Hearing? 1 0  Comment Hearing aids   Vision? 0 0  Difficulty concentrating or making decisions? 0 0  Walking or climbing stairs? 0 0  Dressing or bathing? 0 0  Doing errands, shopping? 0 0  Preparing Food and eating ? N   Using the Toilet? N   In the past six months, have you accidently leaked urine? N   Do you have problems with loss of bowel control? N   Managing your Medications? N   Managing your Finances? N   Housekeeping or managing your Housekeeping? N    Patient Care Team: [name], MD as PCP - General (Family Medicine) Pieter Partridge, DO as Consulting Physician (Neurology) Cammie Sickle, MD as Consulting Physician (Oncology)  Indicate any recent Medical Services you may have received from other than [facility] providers in the past year (date may be approximate).     Assessment:   This is a routine wellness examination for [place].  I connected with  [name] on 07/06/22 by a audio enabled telemedicine application and verified that I am speaking with the correct person using two identifiers.  Patient Location: Home  Provider  Location: Office/Clinic  I discussed the limitations of evaluation and management by telemedicine. The patient expressed understanding and agreed to proceed.   Hearing/Vision screen Hearing Screening - Comments:: Followed by [facility] Hearing aids Vision Screening - Comments:: Followed by [facility] Wears corrective lenses No retinopathy reported They have regular follow up with the ophthalmologist   Dietary issues and exercise activities discussed: Current Exercise Habits: Home exercise routine, Type of exercise: walking;calisthenics, Time (Minutes): 30, Frequency (Times/Week): 5, Weekly Exercise (Minutes/Week): 150, Intensity: Mild Healthy diabetic diet; portion control Good water  intake   Goals Addressed             This Visit's Progress    Increase physical activity       As tolerated.       Depression Screen    07/06/2022   11:41 AM 05/11/2022   11:15 AM 04/20/2022    9:53 AM 12/28/2021    9:56 AM 06/22/2021    3:02 PM 04/06/2021    4:08 PM 09/25/2020   11:25 AM  PHQ 2/9 Scores  PHQ - 2 Score 0 0 0 0 0 0 0    Fall Risk    07/06/2022   11:44 AM 05/11/2022   11:15 AM 04/20/2022    9:53 AM 10/16/2021   10:42 AM 06/22/2021    3:07 PM  Fall Risk   Falls in the past year? 1 1 0 0   Number falls in past yr: 1 1 0 0   Comment  Possible rib fracture fell off chair a week ago     Injury with Fall?  0 0 0   Risk for fall due to :  No Fall Risks     Follow up Falls evaluation completed;Falls prevention discussed Falls evaluation completed   Falls evaluation completed    FALL RISK PREVENTION PERTAINING TO THE HOME: Home free of loose throw rugs in walkways, pet beds, electrical cords, etc? Yes  Adequate lighting in your home to reduce risk of falls? Yes   ASSISTIVE DEVICES UTILIZED TO PREVENT FALLS: Life alert? No  Use of a cane, walker or w/c? No  Grab bars in the bathroom? Yes  Shower chair or bench in shower? Yes  Elevated toilet seat or a handicapped toilet? No   TIMED UP AND GO: Was the test performed? No .   Cognitive Function:    03/31/2018    2:48 PM 03/30/2017    3:35 PM  MMSE - Mini Mental State Exam  Orientation to time 5 5  Orientation to Place 5 5  Registration 3 3  Attention/ Calculation 5 5  Recall 3 3  Language- name 2 objects 2 2  Language- repeat 1 1  Language- follow 3 step command 3 3  Language- read & follow direction 1 1  Write a sentence 1 1  Copy design 1 1  Total score 30 30        07/06/2022   12:04 PM 04/02/2019   11:28 AM  6CIT Screen  What Year? 0 points 0 points  What month? 0 points 0 points  What time? 0 points 0 points  Count back from 20 0 points 0 points  Months in reverse 0 points 0 points   Repeat phrase 0 points 0 points  Total Score 0 points 0 points    Immunizations Immunization History  Administered Date(s) Administered   Fluad Quad(high Dose 65+) 05/01/2019, 05/20/2020, 04/06/2021, 04/23/2022   Influenza, High Dose Seasonal PF 06/25/2016, 05/08/2018  Influenza,inj,Quad PF,6+ Mos 07/10/2013, 04/05/2017   Influenza-Unspecified 05/21/2012, 05/29/2014, 04/10/2015, 04/06/2017   PFIZER(Purple Top)SARS-COV-2 Vaccination 10/05/2019, 10/30/2019   PNEUMOCOCCAL CONJUGATE-20 04/23/2022   Pneumococcal Conjugate-13 06/13/2014   Pneumococcal Polysaccharide-23 03/21/2010, 04/05/2017   Rotavirus,unspecified  06/07/2022   Tdap 06/03/2014   Zoster Recombinat (Shingrix) 04/08/2021, 06/09/2021   Zoster, Live 05/10/2013   Screening Tests Health Maintenance  Topic Date Due   COVID-19 Vaccine (3 - Pfizer risk series) 07/22/2022 (Originally 11/27/2019)   HEMOGLOBIN A1C  09/04/2022   Diabetic kidney evaluation - Urine ACR  02/18/2023   FOOT EXAM  04/29/2023   OPHTHALMOLOGY EXAM  06/22/2023   Diabetic kidney evaluation - GFR measurement  06/26/2023   Medicare Annual Wellness (AWV)  07/07/2023   COLONOSCOPY (Pts 45-80yr Insurance coverage will need to be confirmed)  12/28/2027   Pneumonia Vaccine 71 Years old  Completed   INFLUENZA VACCINE  Completed   Hepatitis C Screening  Completed   Zoster Vaccines- Shingrix  Completed   HPV VACCINES  Aged Out   Health Maintenance There are no preventive care reminders to display for this patient.  Lung Cancer Screening: (Low Dose CT Chest recommended if Age 71-80years, 30 pack-year currently smoking OR have quit w/in 15years.) does not qualify.   Hepatitis C Screening: Completed 2018.  Vision Screening: Recommended annual ophthalmology exams for early detection of glaucoma and other disorders of the eye.  Dental Screening: Recommended annual dental exams for proper oral hygiene.  Community Resource Referral / Chronic Care  Management: CRR required this visit?  No   CCM required this visit?  No      Plan:     I have personally reviewed and noted the following in the patient's chart:   Medical and social history Use of alcohol, tobacco or illicit drugs  Current medications and supplements including opioid prescriptions. Patient is currently taking opioid prescriptions. Information provided to patient regarding non-opioid alternatives. Patient advised to discuss non-opioid treatment plan with their provider. Followed by [facility] at [facility].  Functional ability and status Nutritional status Physical activity Advanced directives List of other physicians Hospitalizations, surgeries, and ER visits in previous 12 months Vitals Screenings to include cognitive, depression, and falls Referrals and appointments  In addition, I have reviewed and discussed with patient certain preventive protocols, quality metrics, and best practice recommendations. A written personalized care plan for preventive services as well as general preventive health recommendations were provided to patient.     DLeta Jungling LPN   [id number]

## 2022-07-06 NOTE — Patient Instructions (Addendum)
Justin Patel , Thank you for taking time to come for your Medicare Wellness Visit. I appreciate your ongoing commitment to your health goals. Please review the following plan we discussed and let me know if I can assist you in the future.   These are the goals we discussed:  Goals      Increase physical activity     As tolerated.        This is a list of the screening recommended for you and due dates:  Health Maintenance  Topic Date Due   COVID-19 Vaccine (3 - Pfizer risk series) 07/22/2022*   Hemoglobin A1C  09/04/2022   Yearly kidney health urinalysis for diabetes  02/18/2023   Complete foot exam   04/29/2023   Eye exam for diabetics  06/22/2023   Yearly kidney function blood test for diabetes  06/26/2023   Medicare Annual Wellness Visit  07/07/2023   Colon Cancer Screening  12/28/2027   Pneumonia Vaccine  Completed   Flu Shot  Completed   Hepatitis C Screening: USPSTF Recommendation to screen - Ages 18-79 yo.  Completed   Zoster (Shingles) Vaccine  Completed   HPV Vaccine  Aged Out  *Topic was postponed. The date shown is not the original due date.    Advanced directives: End of life planning; Advance aging; Advanced directives discussed.  Copy of current HCPOA/Living Will requested.    Conditions/risks identified: none new  Next appointment: Follow up in one year for your annual wellness visit.   Preventive Care 64 Years and Older, Male  Preventive care refers to lifestyle choices and visits with your health care provider that can promote health and wellness. What does preventive care include? A yearly physical exam. This is also called an annual well check. Dental exams once or twice a year. Routine eye exams. Ask your health care provider how often you should have your eyes checked. Personal lifestyle choices, including: Daily care of your teeth and gums. Regular physical activity. Eating a healthy diet. Avoiding tobacco and drug use. Limiting alcohol  use. Practicing safe sex. Taking low doses of aspirin every day. Taking vitamin and mineral supplements as recommended by your health care provider. What happens during an annual well check? The services and screenings done by your health care provider during your annual well check will depend on your age, overall health, lifestyle risk factors, and family history of disease. Counseling  Your health care provider may ask you questions about your: Alcohol use. Tobacco use. Drug use. Emotional well-being. Home and relationship well-being. Sexual activity. Eating habits. History of falls. Memory and ability to understand (cognition). Work and work Statistician. Screening  You may have the following tests or measurements: Height, weight, and BMI. Blood pressure. Lipid and cholesterol levels. These may be checked every 5 years, or more frequently if you are over 71 years old. Skin check. Lung cancer screening. You may have this screening every year starting at age 71 if you have a 30-pack-year history of smoking and currently smoke or have quit within the past 15 years. Fecal occult blood test (FOBT) of the stool. You may have this test every year starting at age 71. Flexible sigmoidoscopy or colonoscopy. You may have a sigmoidoscopy every 5 years or a colonoscopy every 10 years starting at age 71. Prostate cancer screening. Recommendations will vary depending on your family history and other risks. Hepatitis C blood test. Hepatitis B blood test. Sexually transmitted disease (STD) testing. Diabetes screening. This is done by checking  your blood sugar (glucose) after you have not eaten for a while (fasting). You may have this done every 1-3 years. Abdominal aortic aneurysm (AAA) screening. You may need this if you are a current or former smoker. Osteoporosis. You may be screened starting at age 39 if you are at high risk. Talk with your health care provider about your test results,  treatment options, and if necessary, the need for more tests. Vaccines  Your health care provider may recommend certain vaccines, such as: Influenza vaccine. This is recommended every year. Tetanus, diphtheria, and acellular pertussis (Tdap, Td) vaccine. You may need a Td booster every 10 years. Zoster vaccine. You may need this after age 71. Pneumococcal 13-valent conjugate (PCV13) vaccine. One dose is recommended after age 71. Pneumococcal polysaccharide (PPSV23) vaccine. One dose is recommended after age 71. Talk to your health care provider about which screenings and vaccines you need and how often you need them. This information is not intended to replace advice given to you by your health care provider. Make sure you discuss any questions you have with your health care provider. Document Released: 08/22/2015 Document Revised: 04/14/2016 Document Reviewed: 05/27/2015 Elsevier Interactive Patient Education  2017 [place] Prevention in the Home Falls can cause injuries. They can happen to people of all ages. There are many things you can do to make your home safe and to help prevent falls. What can I do on the outside of my home? Regularly fix the edges of walkways and driveways and fix any cracks. Remove anything that might make you trip as you walk through a door, such as a raised step or threshold. Trim any bushes or trees on the path to your home. Use bright outdoor lighting. Clear any walking paths of anything that might make someone trip, such as rocks or tools. Regularly check to see if handrails are loose or broken. Make sure that both sides of any steps have handrails. Any raised decks and porches should have guardrails on the edges. Have any leaves, snow, or ice cleared regularly. Use sand or salt on walking paths during winter. Clean up any spills in your garage right away. This includes oil or grease spills. What can I do in the bathroom? Use night  lights. Install grab bars by the toilet and in the tub and shower. Do not use towel bars as grab bars. Use non-skid mats or decals in the tub or shower. If you need to sit down in the shower, use a plastic, non-slip stool. Keep the floor dry. Clean up any water that spills on the floor as soon as it happens. Remove soap buildup in the tub or shower regularly. Attach bath mats securely with double-sided non-slip rug tape. Do not have throw rugs and other things on the floor that can make you trip. What can I do in the bedroom? Use night lights. Make sure that you have a light by your bed that is easy to reach. Do not use any sheets or blankets that are too big for your bed. They should not hang down onto the floor. Have a firm chair that has side arms. You can use this for support while you get dressed. Do not have throw rugs and other things on the floor that can make you trip. What can I do in the kitchen? Clean up any spills right away. Avoid walking on wet floors. Keep items that you use a lot in easy-to-reach places. If you need to reach something  above you, use a strong step stool that has a grab bar. Keep electrical cords out of the way. Do not use floor polish or wax that makes floors slippery. If you must use wax, use non-skid floor wax. Do not have throw rugs and other things on the floor that can make you trip. What can I do with my stairs? Do not leave any items on the stairs. Make sure that there are handrails on both sides of the stairs and use them. Fix handrails that are broken or loose. Make sure that handrails are as long as the stairways. Check any carpeting to make sure that it is firmly attached to the stairs. Fix any carpet that is loose or worn. Avoid having throw rugs at the top or bottom of the stairs. If you do have throw rugs, attach them to the floor with carpet tape. Make sure that you have a light switch at the top of the stairs and the bottom of the stairs. If  you do not have them, ask someone to add them for you. What else can I do to help prevent falls? Wear shoes that: Do not have high heels. Have rubber bottoms. Are comfortable and fit you well. Are closed at the toe. Do not wear sandals. If you use a stepladder: Make sure that it is fully opened. Do not climb a closed stepladder. Make sure that both sides of the stepladder are locked into place. Ask someone to hold it for you, if possible. Clearly mark and make sure that you can see: Any grab bars or handrails. First and last steps. Where the edge of each step is. Use tools that help you move around (mobility aids) if they are needed. These include: Canes. Walkers. Scooters. Crutches. Turn on the lights when you go into a dark area. Replace any light bulbs as soon as they burn out. Set up your furniture so you have a clear path. Avoid moving your furniture around. If any of your floors are uneven, fix them. If there are any pets around you, be aware of where they are. Review your medicines with your doctor. Some medicines can make you feel dizzy. This can increase your chance of falling. Ask your doctor what other things that you can do to help prevent falls. This information is not intended to replace advice given to you by your health care provider. Make sure you discuss any questions you have with your health care provider. Document Released: 05/22/2009 Document Revised: 01/01/2016 Document Reviewed: 08/30/2014 Elsevier Interactive Patient Education  2017 [employer].

## 2022-07-08 LAB — COPPER, SERUM: Copper: 102 ug/dL (ref 69–132)

## 2022-07-08 LAB — ZINC: Zinc: 64 ug/dL (ref 44–115)

## 2022-07-09 ENCOUNTER — Inpatient Hospital Stay: Payer: Medicare PPO | Attending: Internal Medicine

## 2022-07-09 ENCOUNTER — Other Ambulatory Visit: Payer: Self-pay | Admitting: Internal Medicine

## 2022-07-09 VITALS — BP 179/91 | HR 57 | Temp 97.5°F | Resp 18

## 2022-07-09 DIAGNOSIS — D509 Iron deficiency anemia, unspecified: Secondary | ICD-10-CM | POA: Insufficient documentation

## 2022-07-09 DIAGNOSIS — E611 Iron deficiency: Secondary | ICD-10-CM

## 2022-07-09 MED ORDER — SODIUM CHLORIDE 0.9 % IV SOLN
200.0000 mg | Freq: Once | INTRAVENOUS | Status: AC
  Administered 2022-07-09: 200 mg via INTRAVENOUS
  Filled 2022-07-09: qty 200

## 2022-07-09 MED ORDER — SODIUM CHLORIDE 0.9 % IV SOLN
Freq: Once | INTRAVENOUS | Status: AC
  Filled 2022-07-09: qty 250

## 2022-07-09 MED FILL — Iron Sucrose Inj 20 MG/ML (Fe Equiv): INTRAVENOUS | Qty: 10 | Status: AC

## 2022-07-09 NOTE — Progress Notes (Signed)
Pt refused to stay for the 30 mins after Iron infusion. Pt has declined and understands.

## 2022-07-12 ENCOUNTER — Inpatient Hospital Stay: Payer: Medicare PPO

## 2022-07-12 VITALS — BP 137/83 | HR 65 | Temp 97.9°F | Resp 18

## 2022-07-12 DIAGNOSIS — D509 Iron deficiency anemia, unspecified: Secondary | ICD-10-CM | POA: Diagnosis not present

## 2022-07-12 DIAGNOSIS — E611 Iron deficiency: Secondary | ICD-10-CM

## 2022-07-12 MED ORDER — SODIUM CHLORIDE 0.9 % IV SOLN
Freq: Once | INTRAVENOUS | Status: AC
  Filled 2022-07-12: qty 250

## 2022-07-12 MED ORDER — SODIUM CHLORIDE 0.9 % IV SOLN
200.0000 mg | Freq: Once | INTRAVENOUS | Status: AC
  Administered 2022-07-12: 200 mg via INTRAVENOUS
  Filled 2022-07-12: qty 200

## 2022-07-12 NOTE — Progress Notes (Signed)
Pt refusing to stay for 30 minute post observation

## 2022-07-30 ENCOUNTER — Other Ambulatory Visit: Payer: Self-pay | Admitting: Cardiovascular Disease

## 2022-07-30 DIAGNOSIS — E119 Type 2 diabetes mellitus without complications: Secondary | ICD-10-CM

## 2022-08-11 ENCOUNTER — Encounter: Payer: Self-pay | Admitting: Family Medicine

## 2022-08-11 ENCOUNTER — Ambulatory Visit: Payer: Medicare PPO | Admitting: Family Medicine

## 2022-08-11 VITALS — BP 130/70 | HR 63 | Temp 98.8°F | Ht 65.0 in | Wt 169.4 lb

## 2022-08-11 DIAGNOSIS — E119 Type 2 diabetes mellitus without complications: Secondary | ICD-10-CM

## 2022-08-11 DIAGNOSIS — M5136 Other intervertebral disc degeneration, lumbar region: Secondary | ICD-10-CM

## 2022-08-11 DIAGNOSIS — M545 Low back pain, unspecified: Secondary | ICD-10-CM

## 2022-08-11 DIAGNOSIS — G8929 Other chronic pain: Secondary | ICD-10-CM

## 2022-08-11 DIAGNOSIS — M51369 Other intervertebral disc degeneration, lumbar region without mention of lumbar back pain or lower extremity pain: Secondary | ICD-10-CM

## 2022-08-11 DIAGNOSIS — J0140 Acute pansinusitis, unspecified: Secondary | ICD-10-CM

## 2022-08-11 DIAGNOSIS — E785 Hyperlipidemia, unspecified: Secondary | ICD-10-CM

## 2022-08-11 MED ORDER — GABAPENTIN 300 MG PO CAPS: ORAL_CAPSULE | ORAL | 1 refills | Status: DC

## 2022-08-11 MED ORDER — CYCLOBENZAPRINE HCL 5 MG PO TABS: ORAL_TABLET | ORAL | 1 refills | Status: DC

## 2022-08-11 MED ORDER — AMOXICILLIN-POT CLAVULANATE 875-125 MG PO TABS: 1.0000 | ORAL_TABLET | Freq: Two times a day (BID) | ORAL | 0 refills | Status: DC

## 2022-08-11 NOTE — Assessment & Plan Note (Signed)
Chronic issue.  He will continue to see pain management.  I am going to increase his gabapentin to 300 mg in the morning, 600 mg midday, and 300 mg at night.  If this is not effective we can always increase it further.  I will also refill his Flexeril.  I did encourage him to follow-up with pain management sooner if he feels as though his pain is not adequately controlled by her current regimen.

## 2022-08-11 NOTE — Progress Notes (Signed)
Justin Rumps, MD Phone: [phone]  Justin Patel is a 72 y.o. male who presents today for f/u.  DIABETES Disease Monitoring: Blood Sugar ranges-80-120 Polyuria/phagia/dipsia- no      Optho- UTD Medications: Compliance- taking farxiga, tradjenta Hypoglycemic symptoms- no  HYPERLIPIDEMIA Symptoms Chest pain on exertion:  no    Medications: Compliance- taking zetia, crestor Right upper quadrant pain- no  Muscle aches- no changes to chronic myalgia/arthralgia Lipid Panel     Component Value Date/Time   CHOL 157 04/06/2021 1629   CHOL 171 03/14/2019 0000   TRIG 102.0 04/06/2021 1629   HDL 60.90 04/06/2021 1629   HDL 64 03/14/2019 0000   CHOLHDL 3 04/06/2021 1629   VLDL 20.4 04/06/2021 1629   LDLCALC 76 04/06/2021 1629   LDLCALC 77 03/14/2019 0000   LDLDIRECT 70.0 05/27/2021 0948   LABVLDL 30 03/14/2019 0000   Chronic back pain: Patient continues to see neurosurgery.  They have him back in with pain management in their office.  He has constant back pain after numerous back surgeries.  He has pain radiating down his right leg at times.  He is currently taking oxycodone 7.5 mg twice daily as needed.  He is also on gabapentin 300 mg 3 times daily.  He also takes Flexeril.  He does have a pain contract with the neurosurgeons office.  Patient does not feel as though the oxycodone is controlling his pain very well.  Sinus infection: Patient feels as though he has a sinus infection.  He has headache and pressure in his sinuses and is blowing yellow/brown mucus out of his nose.  He notes his left ear bothers him some.  No fevers.  No postnasal drip.  He has had numerous surgeries on his sinuses.  He follows up with ENT in the near future.  He does have a tympanic membrane tube on the left.   Social History   Tobacco Use  Smoking Status Never  Smokeless Tobacco Never    Current Outpatient Medications on File Prior to Visit  Medication Sig Dispense Refill   acetaminophen  (TYLENOL) 325 MG tablet Take 650 mg by mouth every 6 (six) hours as needed for moderate pain.     blood glucose meter kit and supplies KIT Dispense based on patient and insurance preference. Check once daily. ICD doing E11.9. 1 each 0   Calcium Carbonate-Vitamin D (CALCIUM 600/VITAMIN D PO) Take 1 tablet by mouth daily.     dapagliflozin propanediol (FARXIGA) 10 MG TABS tablet Take 1 tablet (10 mg total) by mouth daily before breakfast. 90 tablet 1   esomeprazole (NEXIUM) 40 MG capsule TAKE 1 CAPSULE BY MOUTH ONCE DAILY 90 capsule 1   ezetimibe (ZETIA) 10 MG tablet TAKE ONE TABLET BY MOUTH EVERY DAY 90 tablet 1   furosemide (LASIX) 20 MG tablet TAKE 1 TABLET BY MOUTH DAILY 90 tablet 1   ketoconazole (NIZORAL) 2 % cream Apply 1 Application topically daily. 15 g 0   metoprolol succinate (TOPROL-XL) 50 MG 24 hr tablet TAKE 1 TABLET BY MOUTH DAILY WITH OR IMMEDIATLY FOLLOWING A MEAL 90 tablet 1   Multiple Vitamin (MULTIVITAMIN) capsule Take 1 capsule by mouth daily. With copper and zinc     nystatin-triamcinolone ointment (MYCOLOG) Apply 1 application topically 2 (two) times daily. (Patient taking differently: Apply 1 application  topically daily as needed (irritation).) 30 g 0   ondansetron (ZOFRAN) 4 MG tablet Take 1 tablet (4 mg total) by mouth daily as needed for nausea or vomiting.  30 tablet 1   oxyCODONE-acetaminophen (PERCOCET) 7.5-325 MG tablet Take 1 tablet by mouth 2 (two) times daily as needed.     primidone (MYSOLINE) 50 MG tablet Take 1 tablet (50 mg total) by mouth at bedtime. 30 tablet 11   rosuvastatin (CRESTOR) 40 MG tablet TAKE ONE TABLET EVERY DAY 30 tablet 1   sucralfate (CARAFATE) 1 g tablet TAKE 1 TABLET BY MOUTH 4 TIMES DAILY 360 tablet 1   tamsulosin (FLOMAX) 0.4 MG CAPS capsule TAKE 1 CAPSULE BY MOUTH EVERY DAY 90 capsule 3   TRADJENTA 5 MG TABS tablet TAKE 1 TABLET BY MOUTH DAILY 90 tablet 1   traMADol (ULTRAM) 50 MG tablet Take 1-2 tablets (50-100 mg total) by mouth every 8  (eight) hours as needed. 180 tablet 0   venlafaxine XR (EFFEXOR-XR) 75 MG 24 hr capsule TAKE ONE CAPSULE EVERY MORNING WITH BREAKFAST 90 capsule 1   No current facility-administered medications on file prior to visit.     ROS see history of present illness  Objective  Physical Exam Vitals:   08/11/22 1353  BP: 130/70  Pulse: 63  Temp: 98.8 F (37.1 C)  SpO2: 96%    BP Readings from Last 3 Encounters:  08/11/22 130/70  07/12/22 137/83  07/09/22 (!) 179/91   Wt Readings from Last 3 Encounters:  08/11/22 169 lb 6.4 oz (76.8 kg)  07/06/22 165 lb (74.8 kg)  06/25/22 178 lb (80.7 kg)    Physical Exam Constitutional:      General: He is not in acute distress.    Appearance: He is not diaphoretic.  Cardiovascular:     Rate and Rhythm: Normal rate and regular rhythm.     Heart sounds: Normal heart sounds.  Pulmonary:     Effort: Pulmonary effort is normal.     Breath sounds: Normal breath sounds.  Musculoskeletal:       Back:  Skin:    General: Skin is warm and dry.  Neurological:     Mental Status: He is alert.      Assessment/Plan: Please see individual problem list.  Type 2 diabetes mellitus without complication, without long-term current use of insulin ([place]) Assessment & Plan: Chronic issue.  Check A1c.  He will continue Farxiga 10 mg daily and Tradjenta 5 mg daily.  Orders: -     Comprehensive metabolic panel -     Lipid panel -     Hemoglobin A1c  DDD (degenerative disc disease), lumbar Assessment & Plan: Chronic issue.  He will continue to see pain management.  I am going to increase his gabapentin to 300 mg in the morning, 600 mg midday, and 300 mg at night.  If this is not effective we can always increase it further.  I will also refill his Flexeril.  I did encourage him to follow-up with pain management sooner if he feels as though his pain is not adequately controlled by her current regimen.  Orders: -     Gabapentin; Take 1 capsule (300 mg  total) by mouth in the morning AND 2 capsules (600 mg total) daily with lunch AND 1 capsule (300 mg total) at bedtime.  Dispense: 360 capsule; Refill: 1 -     Cyclobenzaprine HCl; TAKE 1 TABLET BY MOUTH EVERY 8 HOURS AS NEEDED FOR SPASMS.  Dispense: 30 tablet; Refill: 1  Hyperlipidemia, unspecified hyperlipidemia type Assessment & Plan: Chronic issue.  Check lipid panel.  Continue Crestor 40 mg daily and Zetia 10 mg daily.  Orders: -  Comprehensive metabolic panel -     Lipid panel  Acute non-recurrent pansinusitis Assessment & Plan: Patient likely has sinusitis.  Will treat with Augmentin 1 tablet twice daily for 7 days.  If not improving in the next 3 to 5 days he will let us know.  Orders: -     Amoxicillin-Pot Clavulanate; Take 1 tablet by mouth 2 (two) times daily.  Dispense: 14 tablet; Refill: 0  Chronic bilateral low back pain without sciatica -     Cyclobenzaprine HCl; TAKE 1 TABLET BY MOUTH EVERY 8 HOURS AS NEEDED FOR SPASMS.  Dispense: 30 tablet; Refill: 1     Return in about 3 months (around 11/10/2022) for Diabetes/back pain.   Justin Rumps, MD [place]

## 2022-08-11 NOTE — Assessment & Plan Note (Addendum)
Chronic issue.  Check A1c.  He will continue Farxiga 10 mg daily and Tradjenta 5 mg daily.

## 2022-08-11 NOTE — Assessment & Plan Note (Signed)
Chronic issue.  Check lipid panel.  Continue Crestor 40 mg daily and Zetia 10 mg daily.

## 2022-08-11 NOTE — Patient Instructions (Signed)
Nice to see you. We are going to treat your sinus infection with Augmentin.  If your symptoms are not improving by early next week please let me know. I am going to increase your gabapentin in the middle of the day to 600 mg.  You will still take 300 mg in the morning and 300 mg at night.  If you get excessively drowsy with this dose increase please let me know.  If you do not feel as though this is effective for you please let me know in a week or 2.

## 2022-08-11 NOTE — Assessment & Plan Note (Signed)
Patient likely has sinusitis.  Will treat with Augmentin 1 tablet twice daily for 7 days.  If not improving in the next 3 to 5 days he will let us know.

## 2022-08-12 LAB — LIPID PANEL
Cholesterol: 144 mg/dL (ref 0–200)
HDL: 64.2 mg/dL (ref >39.00)
LDL Cholesterol: 64 mg/dL (ref 0–99)
NonHDL: 80.04
Total CHOL/HDL Ratio: 2
Triglycerides: 81 mg/dL (ref 0.0–149.0)
VLDL: 16.2 mg/dL (ref 0.0–40.0)

## 2022-08-12 LAB — COMPREHENSIVE METABOLIC PANEL
ALT: 66 U/L — ABNORMAL HIGH (ref 0–53)
AST: 52 U/L — ABNORMAL HIGH (ref 0–37)
Albumin: 4 g/dL (ref 3.5–5.2)
Alkaline Phosphatase: 49 U/L (ref 39–117)
BUN: 20 mg/dL (ref 6–23)
CO2: 27 mEq/L (ref 19–32)
Calcium: 9.5 mg/dL (ref 8.4–10.5)
Chloride: 101 mEq/L (ref 96–112)
Creatinine, Ser: 1.32 mg/dL (ref 0.40–1.50)
GFR: 54.27 mL/min — ABNORMAL LOW (ref >60.00)
Glucose, Bld: 108 mg/dL — ABNORMAL HIGH (ref 70–99)
Potassium: 4.6 mEq/L (ref 3.5–5.1)
Sodium: 138 mEq/L (ref 135–145)
Total Bilirubin: 0.3 mg/dL (ref 0.2–1.2)
Total Protein: 6.5 g/dL (ref 6.0–8.3)

## 2022-08-12 LAB — HEMOGLOBIN A1C: Hgb A1c MFr Bld: 6.6 % — ABNORMAL HIGH (ref 4.6–6.5)

## 2022-08-17 ENCOUNTER — Ambulatory Visit: Payer: Medicare PPO | Admitting: Cardiovascular Disease

## 2022-08-20 ENCOUNTER — Ambulatory Visit: Payer: Medicare PPO | Attending: Cardiovascular Disease | Admitting: Cardiovascular Disease

## 2022-08-20 ENCOUNTER — Encounter: Payer: Self-pay | Admitting: Cardiovascular Disease

## 2022-08-20 VITALS — BP 124/78 | HR 71 | Ht 65.0 in | Wt 181.1 lb

## 2022-08-20 DIAGNOSIS — I5032 Chronic diastolic (congestive) heart failure: Secondary | ICD-10-CM | POA: Diagnosis not present

## 2022-08-20 DIAGNOSIS — I491 Atrial premature depolarization: Secondary | ICD-10-CM

## 2022-08-20 DIAGNOSIS — E785 Hyperlipidemia, unspecified: Secondary | ICD-10-CM | POA: Diagnosis not present

## 2022-08-20 DIAGNOSIS — I10 Essential (primary) hypertension: Secondary | ICD-10-CM

## 2022-08-20 NOTE — Progress Notes (Signed)
Cardiology Office Note   Date:  08/20/2022   ID:  Justin Patel, DOB 07/25/1951, MRN [id number]  PCP:  [name], MD  Cardiologist:   Kathlyn Sacramento, MD   Chief Complaint  Patient presents with   Other    12 month f/u no complaints today. Meds reviewed verbally with pt.      History of Present Illness: Justin Patel is a 72 y.o. male who presents for a follow-up visit regarding chronic diastolic heart failure and presyncope versus seizure disorder.  He has known history of morbid obesity status post gastric bypass, iron deficiency anemia, diabetes mellitus, essential hypertension, hyperlipidemia, anxiety, prior alcohol abuse, degenerative disc disease with previous back surgery and GERD. Echocardiogram in June 2020 showed an EF of 55 to 60% with no significant valvular abnormalities.  He has been doing reasonably well from a cardiac standpoint with stable shortness of breath and lower extremity edema.  No chest pain.  He has occasional palpitations.  He continues to struggle with low back pain.  He had multiple back surgeries with complications.  He is on Percocet chronically.   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  removal scheduled to clear margins.   CHF (congestive heart failure) (HCC)    no longer after weight loss   Chronic kidney disease    cysts   Degenerative disc disease    Depression    Diabetes mellitus    no longer diabetic-or on meds   DJD (degenerative joint disease)    Dysplastic nevus 02/04/2016   R upper back paraspinal - severe   Family history of adverse reaction to anesthesia    sons wake up combative   Foot fracture, left 01/2022   Gastric ulcer    GERD (gastroesophageal reflux disease)    H/O hiatal hernia    Headache(784.0)    sinus   Heart murmur    Hx of congestive  heart failure    Hyperlipidemia    Hypertension    Iron deficiency anemia 02/28/2015   Lumbar scoliosis    Opiate abuse, continuous ([place]) 06/20/2015   Sacral fracture, closed ([employer])    Seizures ([place])    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, after GI bleed   Sleep apnea    hx not now since wt loss   Stones in the urinary tract    Syncope and collapse     Past Surgical History:  Procedure Laterality Date   ANTERIOR CERVICAL DECOMP/DISCECTOMY FUSION  01/26/2012   Procedure: ANTERIOR CERVICAL DECOMPRESSION/DISCECTOMY FUSION 3 LEVELS;  Surgeon: Floyce Stakes, MD;  Location: [facility];  Service: Neurosurgery;  Laterality: N/A;  Cervical three-four Cervical four-five Cervical five-six Cervical six-seven , Anterior cervical decompression/diskectomy, fusion, plate   ANTERIOR LAT LUMBAR FUSION Right 04/25/2018   Procedure: Right Lumbar two-three Lumbar three-four Lumbar four-five anterior  lateral interbody fusion  with posterior percutaneous pedicle screws;  Surgeon: Erline Levine, MD;  Location: [place];  Service: Neurosurgery;  Laterality: Right;   [place], normal   CARDIAC CATHETERIZATION     [place]     COLONOSCOPY WITH PROPOFOL N/A 12/27/2017   Procedure: COLONOSCOPY WITH PROPOFOL;  Surgeon: Alice Reichert, Benay Pike, MD;  Location:  [facility];  Service: Gastroenterology;  Laterality: N/A;   ESOPHAGOGASTRODUODENOSCOPY (EGD) WITH PROPOFOL N/A 12/27/2017   Procedure: ESOPHAGOGASTRODUODENOSCOPY (EGD) WITH PROPOFOL;  Surgeon: [place], Benay Pike, MD;  Location: [facility];  Service: Gastroenterology;  Laterality: N/A;   ESOPHAGOGASTRODUODENOSCOPY (EGD) WITH PROPOFOL N/A 06/19/2021   Procedure: ESOPHAGOGASTRODUODENOSCOPY (EGD) WITH PROPOFOL;  Surgeon: [name], MD;  Location: [facility];  Service: Gastroenterology;  Laterality: N/A;   ESOPHAGOGASTRODUODENOSCOPY (EGD) WITH PROPOFOL N/A 09/25/2021    Procedure: ESOPHAGOGASTRODUODENOSCOPY (EGD) WITH PROPOFOL;  Surgeon: [name], MD;  Location: [facility];  Service: Endoscopy;  Laterality: N/A;   GASTRIC BYPASS  08/09/2008   [employer]   HARDWARE REMOVAL N/A 03/08/2022   Procedure: Removal of bilateral Thoracic ten pedicle screws;  Surgeon: Earnie Larsson, MD;  Location: [place];  Service: Neurosurgery;  Laterality: N/A;   HERNIA REPAIR  08/09/1998   hiatal   JOINT REPLACEMENT     KNEE ARTHROSCOPY     bilateral, left x 2   LAMINECTOMY WITH POSTERIOR LATERAL ARTHRODESIS LEVEL 4 N/A 01/14/2020   Procedure: Decompressive Laminectomy Lumbar One with pedicle screw fixation from Thoracic Ten to Lumbar Two;  Surgeon: Erline Levine, MD;  Location: [place];  Service: Neurosurgery;  Laterality: N/A;  Decompressive Laminectomy Lumbar One with pedicle screw fixation from Thoracic Ten to Lumbar Two   LUMBAR PERCUTANEOUS PEDICLE SCREW 3 LEVEL N/A 04/25/2018   Procedure: LUMBAR PERCUTANEOUS PEDICLE SCREW 3 LEVEL;  Surgeon: Erline Levine, MD;  Location: [place];  Service: Neurosurgery;  Laterality: N/A;   NASAL SINUS SURGERY     x5   OSTEOTOMY  08/10/1999   left   ROUX-EN-Y GASTRIC BYPASS     THORACIC LAMINECTOMY FOR EPIDURAL ABSCESS N/A 03/28/2022   Procedure: THORACIC WOUND WASHOUT;  Surgeon: Eustace Moore, MD;  Location: [place];  Service: Neurosurgery;  Laterality: N/A;   TONSILLECTOMY     TOTAL KNEE ARTHROPLASTY Left 05/09/2014   Dr. Marry Guan   UVULOPALATOPLASTY  08/09/2009     Current Outpatient Medications  Medication Sig Dispense Refill   acetaminophen (TYLENOL) 325 MG tablet Take 650 mg by mouth every 6 (six) hours as needed for moderate pain.     amoxicillin-clavulanate (AUGMENTIN) 875-125 MG tablet Take 1 tablet by mouth 2 (two) times daily. 14 tablet 0   blood glucose meter kit and supplies KIT Dispense based on patient and insurance preference. Check once daily. ICD doing E11.9. 1 each 0   Calcium Carbonate-Vitamin D (CALCIUM  600/VITAMIN D PO) Take 1 tablet by mouth daily.     cyclobenzaprine (FLEXERIL) 5 MG tablet TAKE 1 TABLET BY MOUTH EVERY 8 HOURS AS NEEDED FOR SPASMS. 30 tablet 1   esomeprazole (NEXIUM) 40 MG capsule TAKE 1 CAPSULE BY MOUTH ONCE DAILY 90 capsule 1   ezetimibe (ZETIA) 10 MG tablet TAKE ONE TABLET BY MOUTH EVERY DAY 90 tablet 1   furosemide (LASIX) 20 MG tablet TAKE 1 TABLET BY MOUTH DAILY 90 tablet 1   gabapentin (NEURONTIN) 300 MG capsule Take 1 capsule (300 mg total) by mouth in the morning AND 2 capsules (600 mg total) daily with lunch AND 1 capsule (300 mg total) at bedtime. 360 capsule 1   ketoconazole (NIZORAL) 2 % cream Apply 1 Application topically daily. 15 g 0   metoprolol succinate (TOPROL-XL) 50 MG 24 hr tablet TAKE 1 TABLET BY MOUTH DAILY WITH OR IMMEDIATLY FOLLOWING A MEAL 90 tablet 1   Multiple Vitamin (MULTIVITAMIN) capsule Take 1 capsule by mouth daily. With copper  and zinc     nystatin-triamcinolone ointment (MYCOLOG) Apply 1 application topically 2 (two) times daily. (Patient taking differently: Apply 1 application  topically daily as needed (irritation).) 30 g 0   ondansetron (ZOFRAN) 4 MG tablet Take 1 tablet (4 mg total) by mouth daily as needed for nausea or vomiting. 30 tablet 1   oxyCODONE-acetaminophen (PERCOCET) 7.5-325 MG tablet Take 1 tablet by mouth 2 (two) times daily as needed.     primidone (MYSOLINE) 50 MG tablet Take 1 tablet (50 mg total) by mouth at bedtime. 30 tablet 11   rosuvastatin (CRESTOR) 40 MG tablet TAKE ONE TABLET EVERY DAY 30 tablet 1   sucralfate (CARAFATE) 1 g tablet TAKE 1 TABLET BY MOUTH 4 TIMES DAILY 360 tablet 1   tamsulosin (FLOMAX) 0.4 MG CAPS capsule TAKE 1 CAPSULE BY MOUTH EVERY DAY 90 capsule 3   TRADJENTA 5 MG TABS tablet TAKE 1 TABLET BY MOUTH DAILY 90 tablet 1   traMADol (ULTRAM) 50 MG tablet Take 1-2 tablets (50-100 mg total) by mouth every 8 (eight) hours as needed. 180 tablet 0   venlafaxine XR (EFFEXOR-XR) 75 MG 24 hr capsule TAKE ONE  CAPSULE EVERY MORNING WITH BREAKFAST 90 capsule 1   No current facility-administered medications for this visit.    Allergies:   Altace [ramipril], Ace inhibitors, Levaquin [levofloxacin in d5w], Lyrica [pregabalin], Metformin, and Trulicity [dulaglutide]    Social History:  The patient  reports that he has never smoked. He has never used smokeless tobacco. He reports current alcohol use. He reports that he does not use drugs.   Family History:  The patient's family history includes Alcoholism in his maternal uncle; Dementia (age of onset: 16) in his mother; Diabetes in his father; Heart disease (age of onset: 8) in his father; Lung cancer in his paternal aunt, paternal aunt, paternal aunt, and paternal aunt; Lupus in his sister; Lymphoma in his sister; Mesothelioma in his cousin; Osteoporosis in his mother; Ovarian cancer (age of onset: 52) in his sister; Prostate cancer in his maternal uncle; Skin cancer in his maternal uncle; Tongue cancer in his maternal uncle.    ROS:  Please see the history of present illness.   Otherwise, review of systems are positive for none.   All other systems are reviewed and negative.    PHYSICAL EXAM: VS:  BP 124/78 (BP Location: Left Arm, Patient Position: Sitting, Cuff Size: Normal)   Pulse 71   Ht '5\' 5"'$  (1.651 m)   Wt 181 lb 2 oz (82.2 kg)   SpO2 94%   BMI 30.14 kg/m  , BMI Body mass index is 30.14 kg/m. GEN: Well nourished, well developed, in no acute distress  HEENT: normal  Neck: no JVD, carotid bruits, or masses Cardiac: RRR; no  rubs, or gallops, .  1/ 6 systolic murmur in the aortic area.  Mild bilateral leg edema. Respiratory:  clear to auscultation bilaterally, normal work of breathing GI: soft, nontender, nondistended, + BS MS: no deformity or atrophy  Skin: warm and dry, no rash Neuro:  Strength and sensation are intact Psych: euthymic mood, full affect   EKG:  EKG is ordered today. The ekg ordered today demonstrates sinus rhythm  with frequent PACs.   Recent Labs: 06/25/2022: Hemoglobin 12.7; Platelets 238 08/11/2022: ALT 66; BUN 20; Creatinine, Ser 1.32; Potassium 4.6; Sodium 138    Lipid Panel    Component Value Date/Time   CHOL 144 08/11/2022 1415   CHOL 171 03/14/2019 0000   TRIG  81.0 08/11/2022 1415   HDL 64.20 08/11/2022 1415   HDL 64 03/14/2019 0000   CHOLHDL 2 08/11/2022 1415   VLDL 16.2 08/11/2022 1415   LDLCALC 64 08/11/2022 1415   LDLCALC 77 03/14/2019 0000   LDLDIRECT 70.0 05/27/2021 0948      Wt Readings from Last 3 Encounters:  08/20/22 181 lb 2 oz (82.2 kg)  08/11/22 169 lb 6.4 oz (76.8 kg)  07/06/22 165 lb (74.8 kg)           No data to display            ASSESSMENT AND PLAN:   1. Chronic diastolic heart failure: He is euvolemic on small dose furosemide 20 mg daily.  He is also on Farxiga 10 mg daily.  2.   Essential hypertension: Blood pressure is well-controlled on Toprol.  3. Hyperlipidemia: Currently on high-dose rosuvastatin with most recent LDL of 64.  4.  Frequent PACs: Noted on his EKG today but he has minimal symptoms related to this.  Continue Toprol.   Disposition: Follow-up in our clinic in 1 year.  Signed,  Kathlyn Sacramento, MD  08/20/2022 9:14 AM    [employer]

## 2022-08-20 NOTE — Patient Instructions (Signed)
Medication Instructions:  No changes *If you need a refill on your cardiac medications before your next appointment, please call your pharmacy*   Lab Work: None ordered If you have labs (blood work) drawn today and your tests are completely normal, you will receive your results only by: MyChart Message (if you have MyChart) OR A paper copy in the mail If you have any lab test that is abnormal or we need to change your treatment, we will call you to review the results.   Testing/Procedures: None ordered   Follow-Up: At [facility], you and your health needs are our priority.  As part of our continuing mission to provide you with exceptional heart care, we have created designated Provider Care Teams.  These Care Teams include your primary Cardiologist (physician) and Advanced Practice Providers (APPs -  Physician Assistants and Nurse Practitioners) who all work together to provide you with the care you need, when you need it.  We recommend signing up for the patient portal called "MyChart".  Sign up information is provided on this After Visit Summary.  MyChart is used to connect with patients for Virtual Visits (Telemedicine).  Patients are able to view lab/test results, encounter notes, upcoming appointments, etc.  Non-urgent messages can be sent to your provider as well.   To learn more about what you can do with MyChart, go to [email or web address].    Your next appointment:   12 month(s)  Provider:   You may see Dr. Arida or one of the following Advanced Practice Providers on your designated Care Team:   Christopher Berge, NP Ryan Dunn, PA-C Cadence Furth, PA-C Sheri Hammock, NP     

## 2022-08-25 DIAGNOSIS — H903 Sensorineural hearing loss, bilateral: Secondary | ICD-10-CM | POA: Diagnosis not present

## 2022-08-27 ENCOUNTER — Telehealth: Payer: Self-pay

## 2022-08-27 NOTE — Telephone Encounter (Signed)
Left message to call back regarding lab results.

## 2022-08-27 NOTE — Telephone Encounter (Signed)
-----  Message from [name], MD sent at 08/12/2022 12:07 PM EST ----- Please let the patient know that his liver enzymes are little elevated.  Has he had any stomach pain?  Has he been taking any Tylenol?  Has he been drinking any alcohol?  His kidney function is a little worse than it typically is.  Has he been taking any anti-inflammatory such as ibuprofen or Aleve?  How much water has he been drinking?  His diabetes is well controlled.  His other labs are acceptable.

## 2022-08-29 DIAGNOSIS — N1831 Chronic kidney disease, stage 3a: Secondary | ICD-10-CM | POA: Insufficient documentation

## 2022-08-29 DIAGNOSIS — E1122 Type 2 diabetes mellitus with diabetic chronic kidney disease: Secondary | ICD-10-CM | POA: Insufficient documentation

## 2022-08-29 DIAGNOSIS — N2581 Secondary hyperparathyroidism of renal origin: Secondary | ICD-10-CM | POA: Insufficient documentation

## 2022-08-29 DIAGNOSIS — R809 Proteinuria, unspecified: Secondary | ICD-10-CM | POA: Insufficient documentation

## 2022-08-30 DIAGNOSIS — I10 Essential (primary) hypertension: Secondary | ICD-10-CM | POA: Diagnosis not present

## 2022-08-30 DIAGNOSIS — N1831 Chronic kidney disease, stage 3a: Secondary | ICD-10-CM | POA: Diagnosis not present

## 2022-08-30 DIAGNOSIS — N2581 Secondary hyperparathyroidism of renal origin: Secondary | ICD-10-CM | POA: Diagnosis not present

## 2022-08-30 DIAGNOSIS — N189 Chronic kidney disease, unspecified: Secondary | ICD-10-CM | POA: Diagnosis not present

## 2022-08-30 DIAGNOSIS — R809 Proteinuria, unspecified: Secondary | ICD-10-CM | POA: Diagnosis not present

## 2022-08-30 DIAGNOSIS — E1122 Type 2 diabetes mellitus with diabetic chronic kidney disease: Secondary | ICD-10-CM | POA: Diagnosis not present

## 2022-09-01 ENCOUNTER — Ambulatory Visit: Payer: Medicare PPO | Admitting: Family Medicine

## 2022-09-01 ENCOUNTER — Ambulatory Visit (INDEPENDENT_AMBULATORY_CARE_PROVIDER_SITE_OTHER): Payer: Medicare PPO

## 2022-09-01 ENCOUNTER — Encounter: Payer: Self-pay | Admitting: Family Medicine

## 2022-09-01 VITALS — BP 130/64 | HR 97 | Temp 98.0°F | Ht 65.0 in | Wt 178.4 lb

## 2022-09-01 DIAGNOSIS — R058 Other specified cough: Secondary | ICD-10-CM

## 2022-09-01 DIAGNOSIS — R053 Chronic cough: Secondary | ICD-10-CM | POA: Diagnosis not present

## 2022-09-01 DIAGNOSIS — R059 Cough, unspecified: Secondary | ICD-10-CM | POA: Diagnosis not present

## 2022-09-01 MED ORDER — ALBUTEROL SULFATE HFA 108 (90 BASE) MCG/ACT IN AERS: 2.0000 | INHALATION_SPRAY | Freq: Four times a day (QID) | RESPIRATORY_TRACT | 0 refills | Status: DC | PRN

## 2022-09-01 NOTE — Progress Notes (Unsigned)
   SUBJECTIVE:   Chief Complaint  Patient presents with   Cough   HPI Patient presents to clinic for persistent cough.  Symptoms started during Christmas.  Cough is dry.  Sometimes able to produce some brown/dark green balls of mucus.  Reports cough worse at night and evening especially when laying down.  Endorses expiratory wheezing.  Denies any fevers, chills, sick contacts, shortness of breath, pleuritic pain, chest pain, nausea/vomiting, weight loss or lower extremity edema..  No rhinorrhea but does report nasal congestion at night.  PERTINENT PMH / PSH: GERD. Recurrent sinusitis HFpEF  OBJECTIVE:  BP 130/64   Pulse 97   Temp 98 F (36.7 C)   Ht '5\' 5"'$  (1.651 m)   Wt 178 lb 6.4 oz (80.9 kg)   SpO2 97%   BMI 29.69 kg/m    Physical Exam Constitutional:      General: He is not in acute distress.    Appearance: He is normal weight. He is not ill-appearing.  HENT:     Head: Normocephalic.  Eyes:     Conjunctiva/sclera: Conjunctivae normal.  Cardiovascular:     Rate and Rhythm: Normal rate. Rhythm regularly irregular.     Pulses: Normal pulses.  Pulmonary:     Effort: Pulmonary effort is normal.     Breath sounds: Wheezing (prolonged expiratory wheezing) present.  Abdominal:     General: Bowel sounds are normal.  Neurological:     Mental Status: He is alert. Mental status is at baseline.  Psychiatric:        Mood and Affect: Mood normal.        Behavior: Behavior normal.        Thought Content: Thought content normal.        Judgment: Judgment normal.     ASSESSMENT/PLAN:  Other cough -     DG Chest 2 View; Future -     Albuterol Sulfate HFA; Inhale 2 puffs into the lungs every 6 (six) hours as needed for wheezing or shortness of breath.  Dispense: 8 g; Refill: 0   PDMP reviewed  Return if symptoms worsen or fail to improve, for PCP.  Carollee Leitz, MD

## 2022-09-01 NOTE — Patient Instructions (Signed)
It was a pleasure meeting you today. Thank you for allowing me to take part in your health care.  Our goals for today as we discussed include:  Albuterol every 4-6 hours as needed for shortness of breath or wheezing Increase fluid intake to thin out secretions  Chest xray did not show any pneumonia or fluid   Follow up with PCP if symptoms worsen   If you have any questions or concerns, please do not hesitate to call the office at [phone].  I look forward to our next visit and until then take care and stay safe.  Regards,   Carollee Leitz, MD   [facility]

## 2022-09-02 ENCOUNTER — Encounter: Payer: Self-pay | Admitting: Family Medicine

## 2022-09-02 DIAGNOSIS — R053 Chronic cough: Secondary | ICD-10-CM | POA: Insufficient documentation

## 2022-09-02 NOTE — Assessment & Plan Note (Addendum)
New onset persistent cough.  Hemodynamically stable and afebrile.  O2 saturations 97% on room air.  In no acute respiratory distress.  Prolonged expiratory wheeze on exam.  Euvolemic on exam.  Albuterol 1 to 2 puffs every 4-6 hours as needed for shortness of breath or wheezing Chest x-ray Humidified air at night Nasal saline spray as needed Follow-up with PCP if symptoms worsen. Strict return precautions provided.

## 2022-09-03 ENCOUNTER — Other Ambulatory Visit: Payer: Self-pay | Admitting: Cardiovascular Disease

## 2022-09-03 DIAGNOSIS — E119 Type 2 diabetes mellitus without complications: Secondary | ICD-10-CM

## 2022-09-04 ENCOUNTER — Encounter: Payer: Self-pay | Admitting: Family Medicine

## 2022-09-04 ENCOUNTER — Other Ambulatory Visit: Payer: Self-pay | Admitting: Family Medicine

## 2022-09-04 DIAGNOSIS — R600 Localized edema: Secondary | ICD-10-CM

## 2022-09-16 DIAGNOSIS — M5414 Radiculopathy, thoracic region: Secondary | ICD-10-CM | POA: Diagnosis not present

## 2022-09-22 DIAGNOSIS — M5416 Radiculopathy, lumbar region: Secondary | ICD-10-CM | POA: Diagnosis not present

## 2022-09-22 DIAGNOSIS — Z6829 Body mass index (BMI) 29.0-29.9, adult: Secondary | ICD-10-CM | POA: Diagnosis not present

## 2022-09-22 DIAGNOSIS — M5414 Radiculopathy, thoracic region: Secondary | ICD-10-CM | POA: Diagnosis not present

## 2022-09-22 DIAGNOSIS — F1120 Opioid dependence, uncomplicated: Secondary | ICD-10-CM | POA: Diagnosis not present

## 2022-09-22 DIAGNOSIS — M4326 Fusion of spine, lumbar region: Secondary | ICD-10-CM | POA: Diagnosis not present

## 2022-10-01 ENCOUNTER — Other Ambulatory Visit: Payer: Self-pay

## 2022-10-01 DIAGNOSIS — E611 Iron deficiency: Secondary | ICD-10-CM

## 2022-10-01 MED FILL — Iron Sucrose Inj 20 MG/ML (Fe Equiv): INTRAVENOUS | Qty: 10 | Status: AC

## 2022-10-04 ENCOUNTER — Inpatient Hospital Stay: Payer: Medicare PPO | Attending: Internal Medicine

## 2022-10-04 ENCOUNTER — Inpatient Hospital Stay: Payer: Medicare PPO | Admitting: Internal Medicine

## 2022-10-04 ENCOUNTER — Inpatient Hospital Stay: Payer: Medicare PPO

## 2022-10-04 VITALS — BP 165/81 | HR 66 | Temp 97.0°F | Resp 16 | Wt 181.5 lb

## 2022-10-04 DIAGNOSIS — E611 Iron deficiency: Secondary | ICD-10-CM | POA: Diagnosis not present

## 2022-10-04 DIAGNOSIS — E538 Deficiency of other specified B group vitamins: Secondary | ICD-10-CM | POA: Insufficient documentation

## 2022-10-04 DIAGNOSIS — I509 Heart failure, unspecified: Secondary | ICD-10-CM | POA: Diagnosis not present

## 2022-10-04 DIAGNOSIS — I130 Hypertensive heart and chronic kidney disease with heart failure and stage 1 through stage 4 chronic kidney disease, or unspecified chronic kidney disease: Secondary | ICD-10-CM | POA: Insufficient documentation

## 2022-10-04 DIAGNOSIS — N183 Chronic kidney disease, stage 3 unspecified: Secondary | ICD-10-CM | POA: Insufficient documentation

## 2022-10-04 LAB — CBC WITH DIFFERENTIAL (CANCER CENTER ONLY)
Abs Immature Granulocytes: 0.01 10*3/uL (ref 0.00–0.07)
Basophils Absolute: 0 10*3/uL (ref 0.0–0.1)
Basophils Relative: 1 %
Eosinophils Absolute: 0.1 10*3/uL (ref 0.0–0.5)
Eosinophils Relative: 2 %
HCT: 42.5 % (ref 39.0–52.0)
Hemoglobin: 13.9 g/dL (ref 13.0–17.0)
Immature Granulocytes: 0 %
Lymphocytes Relative: 20 %
Lymphs Abs: 1.2 10*3/uL (ref 0.7–4.0)
MCH: 34.3 pg — ABNORMAL HIGH (ref 26.0–34.0)
MCHC: 32.7 g/dL (ref 30.0–36.0)
MCV: 104.9 fL — ABNORMAL HIGH (ref 80.0–100.0)
Monocytes Absolute: 0.4 10*3/uL (ref 0.1–1.0)
Monocytes Relative: 7 %
Neutro Abs: 4.4 10*3/uL (ref 1.7–7.7)
Neutrophils Relative %: 70 %
Platelet Count: 209 10*3/uL (ref 150–400)
RBC: 4.05 MIL/uL — ABNORMAL LOW (ref 4.22–5.81)
RDW: 13.6 % (ref 11.5–15.5)
WBC Count: 6.2 10*3/uL (ref 4.0–10.5)
nRBC: 0 % (ref 0.0–0.2)

## 2022-10-04 LAB — CMP (CANCER CENTER ONLY)
ALT: 31 U/L (ref 0–44)
AST: 40 U/L (ref 15–41)
Albumin: 3.7 g/dL (ref 3.5–5.0)
Alkaline Phosphatase: 55 U/L (ref 38–126)
Anion gap: 9 (ref 5–15)
BUN: 12 mg/dL (ref 8–23)
CO2: 26 mmol/L (ref 22–32)
Calcium: 8.9 mg/dL (ref 8.9–10.3)
Chloride: 101 mmol/L (ref 98–111)
Creatinine: 1.09 mg/dL (ref 0.61–1.24)
GFR, Estimated: >60 mL/min (ref >60)
Glucose, Bld: 111 mg/dL — ABNORMAL HIGH (ref 70–99)
Potassium: 3.9 mmol/L (ref 3.5–5.1)
Sodium: 136 mmol/L (ref 135–145)
Total Bilirubin: 0.4 mg/dL (ref 0.3–1.2)
Total Protein: 6.9 g/dL (ref 6.5–8.1)

## 2022-10-04 LAB — IRON AND TIBC
Iron: 139 ug/dL (ref 45–182)
Saturation Ratios: 38 % (ref 17.9–39.5)
TIBC: 365 ug/dL (ref 250–450)
UIBC: 226 ug/dL

## 2022-10-04 LAB — VITAMIN B12: Vitamin B-12: 380 pg/mL (ref 180–914)

## 2022-10-04 LAB — FERRITIN: Ferritin: 182 ng/mL (ref 24–336)

## 2022-10-04 MED ORDER — CYANOCOBALAMIN 1000 MCG/ML IJ SOLN
1000.0000 ug | Freq: Once | INTRAMUSCULAR | Status: AC
  Administered 2022-10-04: 1000 ug via INTRAMUSCULAR
  Filled 2022-10-04: qty 1

## 2022-10-04 NOTE — Progress Notes (Signed)
Patient reports worsening osteoporosis.    Chronic back pain 6/10 today.  Scheduled for inj 10/2022.

## 2022-10-04 NOTE — Assessment & Plan Note (Addendum)
#   Iron deficiency: sec to gastric bypass. MAY 2023-  Iron sat- 10%; ferritin-12. [PCP; Hb12]-likely secondary to malabsorption.  Patient notes that symptomatic improvement of fatigue.  S/p IV iron.  # hb 13- proceed with maintenance iron infusion/ venofer.  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  B12 shot today.  # Chronic back pain/osteoporosis [Dr.Poole;neurosurg]-status post surgery clinically stable.vit D NOV 2023- 53.   # CKD stage III [Dr.Singh]- stable.   # DISPOSITION:  # HOLD venofer today; B12 shot injection # follow up in 4 months- MD; labs- cbc/bmp; b12 levels-possible IV venofer; B12 injection-Dr.B

## 2022-10-04 NOTE — Progress Notes (Signed)
[place] NOTE  Patient Care Team: [name], MD as PCP - General (Family Medicine) Pieter Partridge, DO as Consulting Physician (Neurology) Cammie Sickle, MD as Consulting Physician (Oncology)  CHIEF COMPLAINTS/PURPOSE OF CONSULTATION: ANEMIA   HEMATOLOGY HISTORY  # ANEMIA [Hb; MCV-platelets- WBC; Iron sat; ferritin;  GFR- CT/US; FEB 2023- EGD [[facility]]/- Evidence of a Roux-en-Y gastrojejunostomy was found. The gastrojejunal anastomosis was characterized by healthy appearing mucosa. The jejunojejunal anastomosis was characterized by healthy appearing mucosa. Previous gastric ulcer has healed. 2019-Colonoscopy   Latest Reference Range & Units 01/05/22 11:13  TIBC 250.0 - 450.0 mcg/dL 533.4 (H)  Saturation Ratios 20.0 - 50.0 % 10.3 (L)  Ferritin 22.0 - 322.0 ng/mL 12.5 (L)  Transferrin 212.0 - 360.0 mg/dL 381.0 (H)  (H): Data is abnormally high (L): Data is abnormally low  # Leucocytosis/weight loss/extreme fatigue-bone marrow biopsy-June 2019-unremarkable.   #Mild macrocytosis without anemia-question copper deficiency/gastric bypass; recommend p.o. copper.   #  SDH [[place]- [id number]; gastric Bypass Surgery    Latest Reference Range & Units 01/05/22 11:13  TIBC 250.0 - 450.0 mcg/dL 533.4 (H)  Saturation Ratios 20.0 - 50.0 % 10.3 (L)  Ferritin 22.0 - 322.0 ng/mL 12.5 (L)  Transferrin 212.0 - 360.0 mg/dL 381.0 (H)  (H): Data is abnormally high (L): Data is abnormally low   No history exists.    Latest Reference Range & Units 01/05/22 11:13  WBC 4.0 - 10.5 K/uL 8.4  RBC 4.22 - 5.81 Mil/uL 4.19 (L)  Hemoglobin 13.0 - 17.0 g/dL 12.3 (L)  HCT 39.0 - 52.0 % 38.3 (L)  MCV 78.0 - 100.0 fl 91.4  MCHC 30.0 - 36.0 g/dL 32.0  RDW 11.5 - 15.5 % 16.7 (H)  Platelets 150.0 - 400.0 K/uL 207.0  (L): Data is abnormally low (H): Data is abnormally high  HISTORY OF PRESENTING ILLNESS: Patient ambulating dependently.  Alone.   Justin Patel 72 y.o.   male pleasant patient with a history of gastric bypass -anemia; chronic back pain s/p multiple surgeries osteoporosis is here for follow-up.  Patient reports worsening osteoporosis.  Patient scheduled for injection with [facility] surgery for osteoporosis in March.   Patient continues have chronic joint pains.  Denies any blood in stools or black or stools.    Review of Systems  Constitutional:  Positive for malaise/fatigue. Negative for chills, diaphoresis, fever and weight loss.  HENT:  Negative for nosebleeds and sore throat.   Eyes:  Negative for double vision.  Respiratory:  Negative for cough, hemoptysis, sputum production, shortness of breath and wheezing.   Cardiovascular:  Negative for chest pain, palpitations, orthopnea and leg swelling.  Gastrointestinal:  Negative for abdominal pain, blood in stool, constipation, diarrhea, heartburn, melena, nausea and vomiting.  Genitourinary:  Negative for dysuria, frequency and urgency.  Musculoskeletal:  Positive for back pain and joint pain.  Skin: Negative.  Negative for itching and rash.  Neurological:  Negative for dizziness, tingling, focal weakness, weakness and headaches.  Endo/Heme/Allergies:  Does not bruise/bleed easily.  Psychiatric/Behavioral:  Negative for depression. The patient is not nervous/anxious and does not have insomnia.      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  removal scheduled to clear margins.   CHF (congestive heart failure) ([place])  no longer after weight loss   Chronic kidney disease    cysts   Degenerative disc disease    Depression    Diabetes mellitus    no longer diabetic-or on meds   DJD (degenerative joint disease)    Dysplastic nevus 02/04/2016   R upper back paraspinal - severe   Family history of adverse reaction to  anesthesia    sons wake up combative   Foot fracture, left 01/2022   Gastric ulcer    GERD (gastroesophageal reflux disease)    H/O hiatal hernia    Headache(784.0)    sinus   Heart murmur    Hx of congestive heart failure    Hyperlipidemia    Hypertension    Iron deficiency anemia 02/28/2015   Lumbar scoliosis    Opiate abuse, continuous ([place]) 06/20/2015   Sacral fracture, closed ([place])    Seizures ([place])    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, after GI bleed   Sleep apnea    hx not now since wt loss   Stones in the urinary tract    Syncope and collapse     SURGICAL HISTORY: Past Surgical History:  Procedure Laterality Date   ANTERIOR CERVICAL DECOMP/DISCECTOMY FUSION  01/26/2012   Procedure: ANTERIOR CERVICAL DECOMPRESSION/DISCECTOMY FUSION 3 LEVELS;  Surgeon: Floyce Stakes, MD;  [facility];  Service: Neurosurgery;  Laterality: N/A;  Cervical three-four Cervical four-five Cervical five-six Cervical six-seven , Anterior cervical decompression/diskectomy, fusion, plate   ANTERIOR LAT LUMBAR FUSION Right 04/25/2018   Procedure: Right Lumbar two-three Lumbar three-four Lumbar four-five anterior  lateral interbody fusion  with posterior percutaneous pedicle screws;  Surgeon: Erline Levine, MD;  Location: [facility];  Service: Neurosurgery;  Laterality: Right;   [place], normal   CARDIAC CATHETERIZATION     [place]     COLONOSCOPY WITH PROPOFOL N/A 12/27/2017   Procedure: COLONOSCOPY WITH PROPOFOL;  Surgeon: Toledo, Benay Pike, MD;  Location: [facility];  Service: Gastroenterology;  Laterality: N/A;   ESOPHAGOGASTRODUODENOSCOPY (EGD) WITH PROPOFOL N/A 12/27/2017   Procedure: ESOPHAGOGASTRODUODENOSCOPY (EGD) WITH PROPOFOL;  Surgeon: Toledo, Benay Pike, MD;  Location: [facility];  Service: Gastroenterology;  Laterality: N/A;   ESOPHAGOGASTRODUODENOSCOPY (EGD) WITH PROPOFOL N/A 06/19/2021    Procedure: ESOPHAGOGASTRODUODENOSCOPY (EGD) WITH PROPOFOL;  Surgeon: [name], MD;  Location: [facility];  Service: Gastroenterology;  Laterality: N/A;   ESOPHAGOGASTRODUODENOSCOPY (EGD) WITH PROPOFOL N/A 09/25/2021   Procedure: ESOPHAGOGASTRODUODENOSCOPY (EGD) WITH PROPOFOL;  Surgeon: [name], MD;  Location: [facility];  Service: Endoscopy;  Laterality: N/A;   GASTRIC BYPASS  08/09/2008   [employer]   HARDWARE REMOVAL N/A 03/08/2022   Procedure: Removal of bilateral Thoracic ten pedicle screws;  Surgeon: Earnie Larsson, MD;  Location: [facility];  Service: Neurosurgery;  Laterality: N/A;   HERNIA REPAIR  08/09/1998   hiatal   JOINT REPLACEMENT     KNEE ARTHROSCOPY     bilateral, left x 2   LAMINECTOMY WITH POSTERIOR LATERAL ARTHRODESIS LEVEL 4 N/A 01/14/2020   Procedure: Decompressive Laminectomy Lumbar One with pedicle screw fixation from Thoracic Ten to Lumbar Two;  Surgeon: Erline Levine, MD;  Location: [facility];  Service: Neurosurgery;  Laterality: N/A;  Decompressive Laminectomy Lumbar One with pedicle screw fixation from Thoracic Ten to Lumbar Two   LUMBAR PERCUTANEOUS PEDICLE SCREW 3 LEVEL N/A 04/25/2018   Procedure: LUMBAR PERCUTANEOUS PEDICLE SCREW 3 LEVEL;  Surgeon: Erline Levine,  MD;  Location: [facility];  Service: Neurosurgery;  Laterality: N/A;   NASAL SINUS SURGERY     x5   OSTEOTOMY  08/10/1999   left   ROUX-EN-Y GASTRIC BYPASS     THORACIC LAMINECTOMY FOR EPIDURAL ABSCESS N/A 03/28/2022   Procedure: THORACIC WOUND WASHOUT;  Surgeon: Eustace Moore, MD;  Location: [facility];  Service: Neurosurgery;  Laterality: N/A;   TONSILLECTOMY     TOTAL KNEE ARTHROPLASTY Left 05/09/2014   Dr. Marry Guan   UVULOPALATOPLASTY  08/09/2009    SOCIAL HISTORY: Social History   Socioeconomic History   Marital status: Married    Spouse name: Not on file   Number of children: 2   Years of education: Not on file   Highest education level: Not on file  Occupational History    Not on file  Tobacco Use   Smoking status: Never   Smokeless tobacco: Never  Vaping Use   Vaping Use: Never used  Substance and Sexual Activity   Alcohol use: Yes    Comment: occassionally   Drug use: No   Sexual activity: Yes    Partners: Female  Other Topics Concern   Not on file  Social History Narrative   Lives in [place] with wife, [place]. 2 sons, William Hamburger, Lennette Bihari 25.. 4 grandchildren      Work - Retired, previously Nurse, learning disability in [place] - regular diet, limited quantities after gastric bypass      Exercise - no regular, limited by arthritis in knees, occasional water aerobics   Right handed   One story house   Social Determinants of Health   Financial Resource Strain: Low Risk  (07/06/2022)   Overall Financial Resource Strain (CARDIA)    Difficulty of Paying Living Expenses: Not hard at all  Food Insecurity: No Food Insecurity (07/06/2022)   Hunger Vital Sign    Worried About Running Out of Food in the Last Year: Never true    [place] in the Last Year: Never true  Transportation Needs: No Transportation Needs (07/06/2022)   PRAPARE - Hydrologist (Medical): No    Lack of Transportation (Non-Medical): No  Physical Activity: Unknown (06/22/2021)   Exercise Vital Sign    Days of Exercise per Week: 0 days    Minutes of Exercise per Session: Not on file  Stress: No Stress Concern Present (07/06/2022)   [place]    Feeling of Stress : Not at all  Social Connections: Unknown (07/06/2022)   Social Connection and Isolation Panel [NHANES]    Frequency of Communication with Friends and Family: Not on file    Frequency of Social Gatherings with Friends and Family: More than three times a week    Attends Religious Services: Not on file    Active Member of Clubs or Organizations: Not on file    Attends Archivist Meetings: Not on  file    Marital Status: Married  Intimate Partner Violence: Not At Risk (07/06/2022)   Humiliation, Afraid, Rape, and Kick questionnaire    Fear of Current or Ex-Partner: No    Emotionally Abused: No    Physically Abused: No    Sexually Abused: No    FAMILY HISTORY: Family History  Problem Relation Age of Onset   Dementia Mother 48   Osteoporosis Mother    Heart disease Father 76   Diabetes Father  Lymphoma Sister        lymphoma, stage 4   Ovarian cancer Sister 67       Ovarian   Lupus Sister    Prostate cancer Maternal Uncle    Skin cancer Maternal Uncle    Tongue cancer Maternal Uncle        uncle died of heart attack   Alcoholism Maternal Uncle    Lung cancer Paternal Aunt        pat aunts x 4 died of lung cancer   Lung cancer Paternal Aunt    Lung cancer Paternal Aunt    Lung cancer Paternal Aunt    Mesothelioma Cousin        paternal cousin    ALLERGIES:  is allergic to altace [ramipril], ace inhibitors, levaquin [levofloxacin in d5w], lyrica [pregabalin], metformin, and trulicity [dulaglutide].  MEDICATIONS:  Current Outpatient Medications  Medication Sig Dispense Refill   acetaminophen (TYLENOL) 325 MG tablet Take 650 mg by mouth every 6 (six) hours as needed for moderate pain.     albuterol (VENTOLIN HFA) 108 (90 Base) MCG/ACT inhaler Inhale 2 puffs into the lungs every 6 (six) hours as needed for wheezing or shortness of breath. 8 g 0   blood glucose meter kit and supplies KIT Dispense based on patient and insurance preference. Check once daily. ICD doing E11.9. 1 each 0   Calcium Carbonate-Vitamin D (CALCIUM 600/VITAMIN D PO) Take 1 tablet by mouth daily.     Cholecalciferol 50 MCG (2000 UT) TABS Take by mouth.     esomeprazole (NEXIUM) 40 MG capsule TAKE 1 CAPSULE BY MOUTH ONCE DAILY 90 capsule 1   ezetimibe (ZETIA) 10 MG tablet TAKE ONE TABLET BY MOUTH EVERY DAY 90 tablet 1   furosemide (LASIX) 20 MG tablet TAKE 1 TABLET BY MOUTH DAILY 90 tablet 1    gabapentin (NEURONTIN) 300 MG capsule Take 1 capsule (300 mg total) by mouth in the morning AND 2 capsules (600 mg total) daily with lunch AND 1 capsule (300 mg total) at bedtime. 360 capsule 1   HYDROcodone-acetaminophen (NORCO) 7.5-325 MG tablet Take 1 tablet by mouth every 6 (six) hours as needed.     ketoconazole (NIZORAL) 2 % cream Apply 1 Application topically daily. 15 g 0   metoprolol succinate (TOPROL-XL) 50 MG 24 hr tablet TAKE 1 TABLET BY MOUTH DAILY WITH OR IMMEDIATLY FOLLOWING A MEAL 90 tablet 1   Multiple Vitamin (MULTIVITAMIN) capsule Take 1 capsule by mouth daily. With copper and zinc     nystatin-triamcinolone ointment (MYCOLOG) Apply 1 application topically 2 (two) times daily. (Patient taking differently: Apply 1 application  topically daily as needed (irritation).) 30 g 0   primidone (MYSOLINE) 50 MG tablet Take 1 tablet (50 mg total) by mouth at bedtime. 30 tablet 11   rosuvastatin (CRESTOR) 40 MG tablet TAKE ONE TABLET EVERY DAY 30 tablet 1   sucralfate (CARAFATE) 1 g tablet TAKE 1 TABLET BY MOUTH 4 TIMES DAILY 360 tablet 1   tamsulosin (FLOMAX) 0.4 MG CAPS capsule TAKE 1 CAPSULE BY MOUTH EVERY DAY 90 capsule 3   TRADJENTA 5 MG TABS tablet TAKE 1 TABLET BY MOUTH DAILY 90 tablet 1   venlafaxine XR (EFFEXOR-XR) 75 MG 24 hr capsule TAKE ONE CAPSULE EVERY MORNING WITH BREAKFAST 90 capsule 1   cyclobenzaprine (FLEXERIL) 5 MG tablet TAKE 1 TABLET BY MOUTH EVERY 8 HOURS AS NEEDED FOR SPASMS. (Patient not taking: Reported on 10/04/2022) 30 tablet 1   ondansetron (ZOFRAN) 4  MG tablet Take 1 tablet (4 mg total) by mouth daily as needed for nausea or vomiting. (Patient not taking: Reported on 10/04/2022) 30 tablet 1   oxyCODONE-acetaminophen (PERCOCET) 7.5-325 MG tablet Take 1 tablet by mouth 2 (two) times daily as needed. (Patient not taking: Reported on 10/04/2022)     traMADol (ULTRAM) 50 MG tablet Take 1-2 tablets (50-100 mg total) by mouth every 8 (eight) hours as needed. (Patient not  taking: Reported on 10/04/2022) 180 tablet 0   No current facility-administered medications for this visit.     Marland Kitchen  PHYSICAL EXAMINATION:   Vitals:   10/04/22 1400  BP: (!) 165/81  Pulse: 66  Resp: 16  Temp: (!) 97 F (36.1 C)    Filed Weights   10/04/22 1400  Weight: 181 lb 8 oz (82.3 kg)     Physical Exam Vitals and nursing note reviewed.  HENT:     Head: Normocephalic and atraumatic.     Mouth/Throat:     Pharynx: Oropharynx is clear.  Eyes:     Extraocular Movements: Extraocular movements intact.     Pupils: Pupils are equal, round, and reactive to light.  Cardiovascular:     Rate and Rhythm: Normal rate and regular rhythm.  Pulmonary:     Comments: Decreased breath sounds bilaterally.  Abdominal:     Palpations: Abdomen is soft.  Musculoskeletal:        General: Normal range of motion.     Cervical back: Normal range of motion.  Skin:    General: Skin is warm.  Neurological:     General: No focal deficit present.     Mental Status: He is alert and oriented to person, place, and time.  Psychiatric:        Behavior: Behavior normal.        Judgment: Judgment normal.      LABORATORY DATA:  I have reviewed the data as listed Lab Results  Component Value Date   WBC 6.2 10/04/2022   HGB 13.9 10/04/2022   HCT 42.5 10/04/2022   MCV 104.9 (H) 10/04/2022   PLT 209 10/04/2022   Recent Labs    03/29/22 0140 03/30/22 0132 04/20/22 1035 06/25/22 1326 08/11/22 1415 10/04/22 1401  NA 138 138   < > 136 138 136  K 3.7 3.7   < > 4.1 4.6 3.9  CL 104 107   < > 100 101 101  CO2 27 26   < > '28 27 26  '$ GLUCOSE 131* 111*   < > 180* 108* 111*  BUN 12 14   < > 26* 20 12  CREATININE 1.10 1.07   < > 0.82 1.32 1.09  CALCIUM 8.5* 8.1*   < > 9.2 9.5 8.9  GFRNONAA >60 >60  --  >60  --  >60  PROT 5.4*  --   --   --  6.5 6.9  ALBUMIN 2.4*  --   --   --  4.0 3.7  AST 24  --   --   --  52* 40  ALT 21  --   --   --  66* 31  ALKPHOS 50  --   --   --  49 55  BILITOT  0.5  --   --   --  0.3 0.4   < > = values in this interval not displayed.     No results found.  ASSESSMENT & PLAN:   Iron deficiency # Iron deficiency: sec to gastric bypass.  MAY 2023-  Iron sat- 10%; ferritin-12. [PCP; Hb12]-likely secondary to malabsorption.  Patient notes that symptomatic improvement of fatigue.  S/p IV iron.  # hb 13- proceed with maintenance iron infusion/ venofer.  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  B12 shot today.  # Chronic back pain/osteoporosis [Dr.Poole;neurosurg]-status post surgery clinically stable.vit D NOV 2023- 8.   # CKD stage III [Dr.Singh]- stable.   # DISPOSITION:  # HOLD venofer today; B12 shot injection # follow up in 4 months- MD; labs- cbc/bmp; b12 levels-possible IV venofer; B12 injection-Dr.B    All questions were answered. The patient knows to call the clinic with any problems, questions or concerns.    Cammie Sickle, MD 10/04/2022 3:52 PM

## 2022-10-08 ENCOUNTER — Other Ambulatory Visit: Payer: Self-pay

## 2022-10-11 ENCOUNTER — Telehealth: Payer: Self-pay | Admitting: Family Medicine

## 2022-10-11 DIAGNOSIS — M5414 Radiculopathy, thoracic region: Secondary | ICD-10-CM | POA: Diagnosis not present

## 2022-10-11 NOTE — Telephone Encounter (Signed)
Prescription Request  10/11/2022  LOV: 08/11/2022  What is the name of the medication or equipment? ezetimibe (ZETIA) 10 MG tablet. Patient has been of medication for 3 days  Have you contacted your pharmacy to request a refill? Yes   Which pharmacy would you like this sent to?  [facility] - [place], [place] - [address] [place] [address] Phone: [phone] Fax: [phone]     Patient notified that their request is being sent to the clinical staff for review and that they should receive a response within 2 business days.   Please advise at [facility] [phone]

## 2022-10-12 ENCOUNTER — Other Ambulatory Visit: Payer: Self-pay

## 2022-10-12 DIAGNOSIS — E785 Hyperlipidemia, unspecified: Secondary | ICD-10-CM

## 2022-10-12 MED ORDER — EZETIMIBE 10 MG PO TABS: 10.0000 mg | ORAL_TABLET | Freq: Every day | ORAL | 1 refills | Status: DC

## 2022-10-12 NOTE — Telephone Encounter (Signed)
sent 
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
## 2022-10-20 ENCOUNTER — Encounter: Payer: Self-pay | Admitting: Neurology

## 2022-10-20 ENCOUNTER — Ambulatory Visit: Payer: Medicare PPO | Admitting: Neurology

## 2022-10-20 VITALS — BP 128/84 | HR 80 | Ht 65.0 in | Wt 181.6 lb

## 2022-10-20 DIAGNOSIS — G250 Essential tremor: Secondary | ICD-10-CM | POA: Diagnosis not present

## 2022-10-20 MED ORDER — PRIMIDONE 50 MG PO TABS: 100.0000 mg | ORAL_TABLET | Freq: Every day | ORAL | 5 refills | Status: DC

## 2022-10-20 NOTE — Patient Instructions (Addendum)
Increase primidone to '100mg'$  at bedtime

## 2022-10-29 ENCOUNTER — Other Ambulatory Visit: Payer: Self-pay | Admitting: Cardiovascular Disease

## 2022-10-29 DIAGNOSIS — E119 Type 2 diabetes mellitus without complications: Secondary | ICD-10-CM

## 2022-11-08 ENCOUNTER — Telehealth: Payer: Self-pay | Admitting: Anesthesiology

## 2022-11-08 NOTE — Telephone Encounter (Signed)
Pt left message stating since doubling his Primidone dosage his tremors have gotten worse and his head started to tremble. Requests a call back.

## 2022-11-09 NOTE — Telephone Encounter (Signed)
Patient advised of Dr.Jaffe note, Have him decrease back to 50mg  at bedtime to see if it is related to increased primidone.  Once tremors normalize, would increase to 75mg  at bedtime instead.

## 2022-11-10 ENCOUNTER — Encounter: Payer: Self-pay | Admitting: Family Medicine

## 2022-11-10 ENCOUNTER — Ambulatory Visit: Payer: Medicare PPO | Admitting: Family Medicine

## 2022-11-10 VITALS — BP 118/78 | HR 80 | Temp 98.2°F | Ht 65.0 in | Wt 178.0 lb

## 2022-11-10 DIAGNOSIS — N400 Enlarged prostate without lower urinary tract symptoms: Secondary | ICD-10-CM | POA: Diagnosis not present

## 2022-11-10 DIAGNOSIS — M5136 Other intervertebral disc degeneration, lumbar region: Secondary | ICD-10-CM

## 2022-11-10 DIAGNOSIS — G8929 Other chronic pain: Secondary | ICD-10-CM

## 2022-11-10 DIAGNOSIS — I700 Atherosclerosis of aorta: Secondary | ICD-10-CM

## 2022-11-10 DIAGNOSIS — E119 Type 2 diabetes mellitus without complications: Secondary | ICD-10-CM

## 2022-11-10 DIAGNOSIS — R251 Tremor, unspecified: Secondary | ICD-10-CM | POA: Insufficient documentation

## 2022-11-10 DIAGNOSIS — I10 Essential (primary) hypertension: Secondary | ICD-10-CM

## 2022-11-10 DIAGNOSIS — M545 Low back pain, unspecified: Secondary | ICD-10-CM

## 2022-11-10 LAB — POCT GLYCOSYLATED HEMOGLOBIN (HGB A1C): Hemoglobin A1C: 6.6 % — AB (ref 4.0–5.6)

## 2022-11-10 MED ORDER — CYCLOBENZAPRINE HCL 5 MG PO TABS: ORAL_TABLET | ORAL | 1 refills | Status: DC

## 2022-11-10 MED ORDER — TAMSULOSIN HCL 0.4 MG PO CAPS: 0.8000 mg | ORAL_CAPSULE | Freq: Every day | ORAL | 1 refills | Status: DC

## 2022-11-10 NOTE — Assessment & Plan Note (Signed)
Chronic issue.  Check A1c.  Continue Tradjenta 5 mg daily.

## 2022-11-10 NOTE — Assessment & Plan Note (Signed)
Continue risk factor management. 

## 2022-11-10 NOTE — Progress Notes (Signed)
[name], MD Phone: [phone]  Justin Patel is a 72 y.o. male who presents today for follow-up.  Diabetes: Patient notes his sugar is getting to 130 at the highest.  He is on Tradjenta.  He does note some polyuria though attributes that to his prostate.  BPH: Patient notes at times it is hard to start his urinary stream and then it starts and stops throughout his urinary flow.  No straining.  He is on Flomax.  Tremor: Patient notes his neurologist increased his primidone dose though that caused his tremor to get worse and he decreased his primidone back to 50 mg.  He notes that has been beneficial.  Chronic back pain: Patient is following with pain management through his surgeons office.  He notes the hydrocodone pain medication does work though it does not last very long so he ends up taking more than prescribed and then runs out early.  He subsequently will drink wine for the rest of the month to help with his discomfort.  He drinks 3 small boxed containers of wine per day.  The wine does help relieve his pain.  He has some shame attached to drinking wine as he was taught that drinking alcohol was a sin when he was growing up.  He is not mixing his alcohol intake with his narcotics.  He does report some radicular symptoms going from his right thoracic region to his right flank and into his groin and then down his leg.  He did have some injections in his back which did not help.  Social History   Tobacco Use  Smoking Status Never  Smokeless Tobacco Never    Current Outpatient Medications on File Prior to Visit  Medication Sig Dispense Refill   acetaminophen (TYLENOL) 325 MG tablet Take 650 mg by mouth every 6 (six) hours as needed for moderate pain.     blood glucose meter kit and supplies KIT Dispense based on patient and insurance preference. Check once daily. ICD doing E11.9. 1 each 0   Calcium Carbonate-Vitamin D (CALCIUM 600/VITAMIN D PO) Take 1 tablet by mouth daily.      Cholecalciferol 50 MCG (2000 UT) TABS Take by mouth.     esomeprazole (NEXIUM) 40 MG capsule TAKE 1 CAPSULE BY MOUTH ONCE DAILY 90 capsule 1   ezetimibe (ZETIA) 10 MG tablet Take 1 tablet (10 mg total) by mouth daily. 90 tablet 1   furosemide (LASIX) 20 MG tablet TAKE 1 TABLET BY MOUTH DAILY 90 tablet 1   gabapentin (NEURONTIN) 300 MG capsule Take 1 capsule (300 mg total) by mouth in the morning AND 2 capsules (600 mg total) daily with lunch AND 1 capsule (300 mg total) at bedtime. 360 capsule 1   HYDROcodone-acetaminophen (NORCO) 7.5-325 MG tablet Take 1 tablet by mouth every 6 (six) hours as needed.     ketoconazole (NIZORAL) 2 % cream Apply 1 Application topically daily. 15 g 0   metoprolol succinate (TOPROL-XL) 50 MG 24 hr tablet TAKE 1 TABLET BY MOUTH DAILY WITH OR IMMEDIATLY FOLLOWING A MEAL 90 tablet 1   Multiple Vitamin (MULTIVITAMIN) capsule Take 1 capsule by mouth daily. With copper and zinc     nystatin-triamcinolone ointment (MYCOLOG) Apply 1 application topically 2 (two) times daily. (Patient taking differently: Apply 1 application  topically daily as needed (irritation).) 30 g 0   primidone (MYSOLINE) 50 MG tablet Take 2 tablets (100 mg total) by mouth at bedtime. 60 tablet 5   rosuvastatin (CRESTOR) 40 MG  tablet TAKE ONE TABLET EVERY DAY 30 tablet 6   sucralfate (CARAFATE) 1 g tablet TAKE 1 TABLET BY MOUTH 4 TIMES DAILY 360 tablet 1   TRADJENTA 5 MG TABS tablet TAKE 1 TABLET BY MOUTH DAILY 90 tablet 1   venlafaxine XR (EFFEXOR-XR) 75 MG 24 hr capsule TAKE ONE CAPSULE EVERY MORNING WITH BREAKFAST 90 capsule 1   oxyCODONE-acetaminophen (PERCOCET) 7.5-325 MG tablet Take 1 tablet by mouth 2 (two) times daily as needed. (Patient not taking: Reported on 11/10/2022)     No current facility-administered medications on file prior to visit.     ROS see history of present illness  Objective  Physical Exam Vitals:   11/10/22 1451  BP: 118/78  Pulse: 80  Temp: 98.2 F (36.8 C)   SpO2: 97%    BP Readings from Last 3 Encounters:  11/10/22 118/78  10/20/22 128/84  10/04/22 (!) 165/81   Wt Readings from Last 3 Encounters:  11/10/22 178 lb (80.7 kg)  10/20/22 181 lb 9.6 oz (82.4 kg)  10/04/22 181 lb 8 oz (82.3 kg)    Physical Exam Constitutional:      General: He is not in acute distress.    Appearance: He is not diaphoretic.  Cardiovascular:     Rate and Rhythm: Normal rate and regular rhythm.     Heart sounds: Normal heart sounds.  Pulmonary:     Effort: Pulmonary effort is normal.     Breath sounds: Normal breath sounds.  Skin:    General: Skin is warm and dry.  Neurological:     Mental Status: He is alert.      Assessment/Plan: Please see individual problem list.  Essential hypertension, benign Assessment & Plan: Chronic issue.  Adequately controlled.  Patient will continue metoprolol 50 mg daily.   Type 2 diabetes mellitus without complication, without long-term current use of insulin Assessment & Plan: Chronic issue.  Check A1c.  Continue Tradjenta 5 mg daily.  Orders: -     POCT glycosylated hemoglobin (Hb A1C)  Benign prostatic hyperplasia, unspecified whether lower urinary tract symptoms present Assessment & Plan: Chronic issue.  Patient has had some increase in BPH symptoms.  Will increase his Flomax to 0.8 mg once daily.  He will monitor for lightheadedness with this.  Orders: -     Tamsulosin HCl; Take 2 capsules (0.8 mg total) by mouth daily.  Dispense: 180 capsule; Refill: 1  Aortic atherosclerosis Assessment & Plan: Continue risk factor management.   Chronic bilateral low back pain without sciatica Assessment & Plan: Chronic issue.  He will follow-up with pain management as scheduled.  I discussed that they may need to consider altering his pain regimen.  Discussed that there are extended release pain medications that they could potentially try if they felt that that was a good option.  I will refill his Flexeril.  He  was advised to monitor for drowsiness with this.  He was advised not to drink any alcohol when he takes the Flexeril.  Orders: -     Cyclobenzaprine HCl; TAKE 1 TABLET BY MOUTH EVERY 8 HOURS AS NEEDED FOR SPASMS.  Dispense: 30 tablet; Refill: 1  DDD (degenerative disc disease), lumbar -     Cyclobenzaprine HCl; TAKE 1 TABLET BY MOUTH EVERY 8 HOURS AS NEEDED FOR SPASMS.  Dispense: 30 tablet; Refill: 1    Return in about 3 months (around 02/09/2023).   [name], MD [place]

## 2022-11-10 NOTE — Assessment & Plan Note (Signed)
Chronic issue.  Patient has had some increase in BPH symptoms.  Will increase his Flomax to 0.8 mg once daily.  He will monitor for lightheadedness with this.

## 2022-11-10 NOTE — Assessment & Plan Note (Signed)
Patient will continue to follow with neurology.

## 2022-11-10 NOTE — Assessment & Plan Note (Signed)
Chronic issue.  Adequately controlled.  Patient will continue metoprolol 50 mg daily.

## 2022-11-10 NOTE — Assessment & Plan Note (Signed)
>>  ASSESSMENT AND PLAN FOR CHRONIC BACK PAIN WRITTEN ON 11/10/2022  3:25 PM BY SONNENBERG, ERIC G, MD  Chronic issue.  He will follow-up with pain management as scheduled.  I discussed that they may need to consider altering his pain regimen.  Discussed that there are extended release pain medications that they could potentially try if they felt that that was a good option.  I will refill his Flexeril .  He was advised to monitor for drowsiness with this.  He was advised not to drink any alcohol when he takes the Flexeril .

## 2022-11-10 NOTE — Assessment & Plan Note (Addendum)
Chronic issue.  He will follow-up with pain management as scheduled.  I discussed that they may need to consider altering his pain regimen.  Discussed that there are extended release pain medications that they could potentially try if they felt that that was a good option.  I will refill his Flexeril.  He was advised to monitor for drowsiness with this.  He was advised not to drink any alcohol when he takes the Flexeril.

## 2022-11-17 DIAGNOSIS — Z6829 Body mass index (BMI) 29.0-29.9, adult: Secondary | ICD-10-CM | POA: Diagnosis not present

## 2022-11-17 DIAGNOSIS — F1120 Opioid dependence, uncomplicated: Secondary | ICD-10-CM | POA: Diagnosis not present

## 2022-11-17 DIAGNOSIS — M4326 Fusion of spine, lumbar region: Secondary | ICD-10-CM | POA: Diagnosis not present

## 2022-11-17 DIAGNOSIS — M5414 Radiculopathy, thoracic region: Secondary | ICD-10-CM | POA: Diagnosis not present

## 2022-11-19 ENCOUNTER — Emergency Department (HOSPITAL_COMMUNITY): Payer: Medicare PPO

## 2022-11-19 ENCOUNTER — Inpatient Hospital Stay (HOSPITAL_COMMUNITY)
Admission: EM | Admit: 2022-11-19 | Discharge: 2022-11-23 | DRG: 194 | Disposition: A | Discharge status: Home / Self Care | Payer: Medicare PPO | Attending: Family Medicine | Admitting: Family Medicine

## 2022-11-19 ENCOUNTER — Encounter (HOSPITAL_COMMUNITY): Payer: Self-pay | Admitting: Internal Medicine

## 2022-11-19 DIAGNOSIS — Z96652 Presence of left artificial knee joint: Secondary | ICD-10-CM | POA: Diagnosis present

## 2022-11-19 DIAGNOSIS — K2100 Gastro-esophageal reflux disease with esophagitis, without bleeding: Secondary | ICD-10-CM | POA: Diagnosis present

## 2022-11-19 DIAGNOSIS — Z79899 Other long term (current) drug therapy: Secondary | ICD-10-CM | POA: Diagnosis not present

## 2022-11-19 DIAGNOSIS — E876 Hypokalemia: Secondary | ICD-10-CM | POA: Diagnosis not present

## 2022-11-19 DIAGNOSIS — I130 Hypertensive heart and chronic kidney disease with heart failure and stage 1 through stage 4 chronic kidney disease, or unspecified chronic kidney disease: Secondary | ICD-10-CM | POA: Diagnosis present

## 2022-11-19 DIAGNOSIS — E785 Hyperlipidemia, unspecified: Secondary | ICD-10-CM | POA: Diagnosis present

## 2022-11-19 DIAGNOSIS — Z981 Arthrodesis status: Secondary | ICD-10-CM

## 2022-11-19 DIAGNOSIS — Z8679 Personal history of other diseases of the circulatory system: Secondary | ICD-10-CM | POA: Diagnosis not present

## 2022-11-19 DIAGNOSIS — N1831 Chronic kidney disease, stage 3a: Secondary | ICD-10-CM | POA: Diagnosis present

## 2022-11-19 DIAGNOSIS — R531 Weakness: Secondary | ICD-10-CM | POA: Diagnosis not present

## 2022-11-19 DIAGNOSIS — Z8711 Personal history of peptic ulcer disease: Secondary | ICD-10-CM

## 2022-11-19 DIAGNOSIS — F1010 Alcohol abuse, uncomplicated: Secondary | ICD-10-CM | POA: Diagnosis present

## 2022-11-19 DIAGNOSIS — R109 Unspecified abdominal pain: Secondary | ICD-10-CM | POA: Diagnosis not present

## 2022-11-19 DIAGNOSIS — Z8042 Family history of malignant neoplasm of prostate: Secondary | ICD-10-CM

## 2022-11-19 DIAGNOSIS — M5416 Radiculopathy, lumbar region: Secondary | ICD-10-CM | POA: Diagnosis present

## 2022-11-19 DIAGNOSIS — G8929 Other chronic pain: Secondary | ICD-10-CM | POA: Diagnosis not present

## 2022-11-19 DIAGNOSIS — K5730 Diverticulosis of large intestine without perforation or abscess without bleeding: Secondary | ICD-10-CM | POA: Diagnosis present

## 2022-11-19 DIAGNOSIS — Z1152 Encounter for screening for COVID-19: Secondary | ICD-10-CM | POA: Diagnosis not present

## 2022-11-19 DIAGNOSIS — Z807 Family history of other malignant neoplasms of lymphoid, hematopoietic and related tissues: Secondary | ICD-10-CM

## 2022-11-19 DIAGNOSIS — Z9884 Bariatric surgery status: Secondary | ICD-10-CM

## 2022-11-19 DIAGNOSIS — Z832 Family history of diseases of the blood and blood-forming organs and certain disorders involving the immune mechanism: Secondary | ICD-10-CM

## 2022-11-19 DIAGNOSIS — J180 Bronchopneumonia, unspecified organism: Secondary | ICD-10-CM | POA: Diagnosis present

## 2022-11-19 DIAGNOSIS — Z8249 Family history of ischemic heart disease and other diseases of the circulatory system: Secondary | ICD-10-CM | POA: Diagnosis not present

## 2022-11-19 DIAGNOSIS — Z801 Family history of malignant neoplasm of trachea, bronchus and lung: Secondary | ICD-10-CM | POA: Diagnosis not present

## 2022-11-19 DIAGNOSIS — E1122 Type 2 diabetes mellitus with diabetic chronic kidney disease: Secondary | ICD-10-CM | POA: Diagnosis present

## 2022-11-19 DIAGNOSIS — A419 Sepsis, unspecified organism: Secondary | ICD-10-CM

## 2022-11-19 DIAGNOSIS — Z87892 Personal history of anaphylaxis: Secondary | ICD-10-CM | POA: Diagnosis not present

## 2022-11-19 DIAGNOSIS — I2510 Atherosclerotic heart disease of native coronary artery without angina pectoris: Secondary | ICD-10-CM | POA: Diagnosis present

## 2022-11-19 DIAGNOSIS — G9340 Encephalopathy, unspecified: Secondary | ICD-10-CM | POA: Diagnosis not present

## 2022-11-19 DIAGNOSIS — G9341 Metabolic encephalopathy: Principal | ICD-10-CM

## 2022-11-19 DIAGNOSIS — G894 Chronic pain syndrome: Secondary | ICD-10-CM | POA: Diagnosis present

## 2022-11-19 DIAGNOSIS — M549 Dorsalgia, unspecified: Secondary | ICD-10-CM | POA: Diagnosis present

## 2022-11-19 DIAGNOSIS — Z8262 Family history of osteoporosis: Secondary | ICD-10-CM

## 2022-11-19 DIAGNOSIS — I10 Essential (primary) hypertension: Secondary | ICD-10-CM | POA: Diagnosis not present

## 2022-11-19 DIAGNOSIS — M419 Scoliosis, unspecified: Secondary | ICD-10-CM | POA: Diagnosis present

## 2022-11-19 DIAGNOSIS — E119 Type 2 diabetes mellitus without complications: Secondary | ICD-10-CM

## 2022-11-19 DIAGNOSIS — R000 Tachycardia, unspecified: Secondary | ICD-10-CM | POA: Diagnosis not present

## 2022-11-19 DIAGNOSIS — Z888 Allergy status to other drugs, medicaments and biological substances status: Secondary | ICD-10-CM

## 2022-11-19 DIAGNOSIS — N400 Enlarged prostate without lower urinary tract symptoms: Secondary | ICD-10-CM | POA: Diagnosis present

## 2022-11-19 DIAGNOSIS — K2289 Other specified disease of esophagus: Secondary | ICD-10-CM | POA: Diagnosis not present

## 2022-11-19 DIAGNOSIS — J9809 Other diseases of bronchus, not elsewhere classified: Secondary | ICD-10-CM | POA: Diagnosis not present

## 2022-11-19 DIAGNOSIS — Z7984 Long term (current) use of oral hypoglycemic drugs: Secondary | ICD-10-CM

## 2022-11-19 DIAGNOSIS — J189 Pneumonia, unspecified organism: Secondary | ICD-10-CM | POA: Diagnosis present

## 2022-11-19 DIAGNOSIS — Z87898 Personal history of other specified conditions: Secondary | ICD-10-CM | POA: Diagnosis present

## 2022-11-19 DIAGNOSIS — Z833 Family history of diabetes mellitus: Secondary | ICD-10-CM

## 2022-11-19 DIAGNOSIS — N179 Acute kidney failure, unspecified: Secondary | ICD-10-CM | POA: Diagnosis present

## 2022-11-19 DIAGNOSIS — Z9049 Acquired absence of other specified parts of digestive tract: Secondary | ICD-10-CM

## 2022-11-19 DIAGNOSIS — N138 Other obstructive and reflux uropathy: Secondary | ICD-10-CM | POA: Diagnosis present

## 2022-11-19 DIAGNOSIS — R251 Tremor, unspecified: Secondary | ICD-10-CM | POA: Diagnosis present

## 2022-11-19 DIAGNOSIS — R509 Fever, unspecified: Secondary | ICD-10-CM | POA: Diagnosis not present

## 2022-11-19 DIAGNOSIS — M545 Low back pain, unspecified: Secondary | ICD-10-CM | POA: Diagnosis not present

## 2022-11-19 DIAGNOSIS — J432 Centrilobular emphysema: Secondary | ICD-10-CM | POA: Diagnosis not present

## 2022-11-19 DIAGNOSIS — R918 Other nonspecific abnormal finding of lung field: Secondary | ICD-10-CM | POA: Diagnosis not present

## 2022-11-19 LAB — URINALYSIS, W/ REFLEX TO CULTURE (INFECTION SUSPECTED)
Bacteria, UA: NONE SEEN
Bilirubin Urine: NEGATIVE
Glucose, UA: NEGATIVE mg/dL
Hgb urine dipstick: NEGATIVE
Ketones, ur: NEGATIVE mg/dL
Leukocytes,Ua: NEGATIVE
Nitrite: NEGATIVE
Protein, ur: 100 mg/dL — AB
Specific Gravity, Urine: 1.018 (ref 1.005–1.030)
pH: 5 (ref 5.0–8.0)

## 2022-11-19 LAB — COMPREHENSIVE METABOLIC PANEL
ALT: 60 U/L — ABNORMAL HIGH (ref 0–44)
AST: 52 U/L — ABNORMAL HIGH (ref 15–41)
Albumin: 2.9 g/dL — ABNORMAL LOW (ref 3.5–5.0)
Alkaline Phosphatase: 50 U/L (ref 38–126)
Anion gap: 8 (ref 5–15)
BUN: 23 mg/dL (ref 8–23)
CO2: 23 mmol/L (ref 22–32)
Calcium: 8 mg/dL — ABNORMAL LOW (ref 8.9–10.3)
Chloride: 108 mmol/L (ref 98–111)
Creatinine, Ser: 1.25 mg/dL — ABNORMAL HIGH (ref 0.61–1.24)
GFR, Estimated: >60 mL/min (ref >60)
Glucose, Bld: 170 mg/dL — ABNORMAL HIGH (ref 70–99)
Potassium: 3.5 mmol/L (ref 3.5–5.1)
Sodium: 139 mmol/L (ref 135–145)
Total Bilirubin: 0.7 mg/dL (ref 0.3–1.2)
Total Protein: 5.8 g/dL — ABNORMAL LOW (ref 6.5–8.1)

## 2022-11-19 LAB — CBC WITH DIFFERENTIAL/PLATELET
Abs Immature Granulocytes: 0.04 10*3/uL (ref 0.00–0.07)
Basophils Absolute: 0.1 10*3/uL (ref 0.0–0.1)
Basophils Relative: 1 %
Eosinophils Absolute: 0 10*3/uL (ref 0.0–0.5)
Eosinophils Relative: 0 %
HCT: 40.8 % (ref 39.0–52.0)
Hemoglobin: 12.9 g/dL — ABNORMAL LOW (ref 13.0–17.0)
Immature Granulocytes: 0 %
Lymphocytes Relative: 2 %
Lymphs Abs: 0.2 10*3/uL — ABNORMAL LOW (ref 0.7–4.0)
MCH: 34.4 pg — ABNORMAL HIGH (ref 26.0–34.0)
MCHC: 31.6 g/dL (ref 30.0–36.0)
MCV: 108.8 fL — ABNORMAL HIGH (ref 80.0–100.0)
Monocytes Absolute: 0.4 10*3/uL (ref 0.1–1.0)
Monocytes Relative: 4 %
Neutro Abs: 10.1 10*3/uL — ABNORMAL HIGH (ref 1.7–7.7)
Neutrophils Relative %: 93 %
Platelets: 100 10*3/uL — ABNORMAL LOW (ref 150–400)
RBC: 3.75 MIL/uL — ABNORMAL LOW (ref 4.22–5.81)
RDW: 13.6 % (ref 11.5–15.5)
WBC: 10.8 10*3/uL — ABNORMAL HIGH (ref 4.0–10.5)
nRBC: 0 % (ref 0.0–0.2)

## 2022-11-19 LAB — ACETAMINOPHEN LEVEL: Acetaminophen (Tylenol), Serum: 13 ug/mL (ref 10–30)

## 2022-11-19 LAB — PROTIME-INR
INR: 1.1 (ref 0.8–1.2)
Prothrombin Time: 14.1 seconds (ref 11.4–15.2)

## 2022-11-19 LAB — LACTIC ACID, PLASMA: Lactic Acid, Venous: 1.6 mmol/L (ref 0.5–1.9)

## 2022-11-19 LAB — SALICYLATE LEVEL: Salicylate Lvl: <7 mg/dL — ABNORMAL LOW (ref 7.0–30.0)

## 2022-11-19 LAB — SARS CORONAVIRUS 2 BY RT PCR: SARS Coronavirus 2 by RT PCR: NEGATIVE

## 2022-11-19 LAB — AMMONIA: Ammonia: 27 umol/L (ref 9–35)

## 2022-11-19 LAB — APTT: aPTT: 27 seconds (ref 24–36)

## 2022-11-19 MED ORDER — VENLAFAXINE HCL ER 75 MG PO CP24
75.0000 mg | ORAL_CAPSULE | Freq: Every day | ORAL | Status: DC
  Administered 2022-11-20 – 2022-11-23 (×4): 75 mg via ORAL
  Filled 2022-11-19 (×4): qty 1

## 2022-11-19 MED ORDER — SODIUM CHLORIDE 0.9 % IV SOLN
500.0000 mg | Freq: Once | INTRAVENOUS | Status: AC
  Administered 2022-11-19: 500 mg via INTRAVENOUS
  Filled 2022-11-19: qty 5

## 2022-11-19 MED ORDER — METOPROLOL SUCCINATE ER 25 MG PO TB24
50.0000 mg | ORAL_TABLET | Freq: Every day | ORAL | Status: DC
  Administered 2022-11-20 – 2022-11-23 (×4): 50 mg via ORAL
  Filled 2022-11-19 (×4): qty 2

## 2022-11-19 MED ORDER — INSULIN ASPART 100 UNIT/ML IJ SOLN: 0.0000 [IU] | Freq: Every day | INTRAMUSCULAR | Status: DC

## 2022-11-19 MED ORDER — SODIUM CHLORIDE 0.9 % IV SOLN
1.0000 g | Freq: Once | INTRAVENOUS | Status: AC
  Administered 2022-11-19: 1 g via INTRAVENOUS
  Filled 2022-11-19: qty 10

## 2022-11-19 MED ORDER — ACETAMINOPHEN 650 MG RE SUPP: 650.0000 mg | Freq: Four times a day (QID) | RECTAL | Status: DC | PRN

## 2022-11-19 MED ORDER — SODIUM CHLORIDE 0.9 % IV BOLUS
1000.0000 mL | Freq: Once | INTRAVENOUS | Status: AC
  Administered 2022-11-19: 1000 mL via INTRAVENOUS

## 2022-11-19 MED ORDER — LINAGLIPTIN 5 MG PO TABS
5.0000 mg | ORAL_TABLET | Freq: Every day | ORAL | Status: DC
  Administered 2022-11-20 – 2022-11-23 (×4): 5 mg via ORAL
  Filled 2022-11-19 (×4): qty 1

## 2022-11-19 MED ORDER — FUROSEMIDE 20 MG PO TABS
20.0000 mg | ORAL_TABLET | Freq: Every day | ORAL | Status: DC
  Administered 2022-11-20 – 2022-11-23 (×4): 20 mg via ORAL
  Filled 2022-11-19 (×4): qty 1

## 2022-11-19 MED ORDER — HYDROCODONE-ACETAMINOPHEN 10-325 MG PO TABS: 1.0000 | ORAL_TABLET | Freq: Once | ORAL | Status: DC

## 2022-11-19 MED ORDER — TAMSULOSIN HCL 0.4 MG PO CAPS
0.8000 mg | ORAL_CAPSULE | Freq: Every day | ORAL | Status: DC
  Administered 2022-11-20 – 2022-11-23 (×4): 0.8 mg via ORAL
  Filled 2022-11-19 (×4): qty 2

## 2022-11-19 MED ORDER — ONDANSETRON HCL 4 MG/2ML IJ SOLN
4.0000 mg | Freq: Once | INTRAMUSCULAR | Status: AC
  Administered 2022-11-19: 4 mg via INTRAVENOUS
  Filled 2022-11-19: qty 2

## 2022-11-19 MED ORDER — AZITHROMYCIN 250 MG PO TABS
500.0000 mg | ORAL_TABLET | Freq: Every day | ORAL | Status: DC
  Administered 2022-11-20 – 2022-11-23 (×4): 500 mg via ORAL
  Filled 2022-11-19 (×4): qty 2

## 2022-11-19 MED ORDER — SODIUM CHLORIDE 0.9 % IV SOLN
2.0000 g | Freq: Every day | INTRAVENOUS | Status: DC
  Administered 2022-11-20 – 2022-11-22 (×3): 2 g via INTRAVENOUS
  Filled 2022-11-19 (×3): qty 20

## 2022-11-19 MED ORDER — HYDROMORPHONE HCL 2 MG PO TABS
2.0000 mg | ORAL_TABLET | Freq: Once | ORAL | Status: AC
  Administered 2022-11-19: 2 mg via ORAL
  Filled 2022-11-19: qty 1

## 2022-11-19 MED ORDER — PRIMIDONE 50 MG PO TABS
100.0000 mg | ORAL_TABLET | Freq: Every day | ORAL | Status: DC
  Administered 2022-11-20 – 2022-11-22 (×3): 100 mg via ORAL
  Filled 2022-11-19 (×6): qty 2

## 2022-11-19 MED ORDER — GABAPENTIN 300 MG PO CAPS
300.0000 mg | ORAL_CAPSULE | Freq: Three times a day (TID) | ORAL | Status: DC
  Administered 2022-11-20 – 2022-11-23 (×10): 300 mg via ORAL
  Filled 2022-11-19 (×12): qty 1

## 2022-11-19 MED ORDER — MELATONIN 5 MG PO TABS: 10.0000 mg | ORAL_TABLET | Freq: Every evening | ORAL | Status: DC | PRN

## 2022-11-19 MED ORDER — PANTOPRAZOLE SODIUM 40 MG PO TBEC
40.0000 mg | DELAYED_RELEASE_TABLET | Freq: Every day | ORAL | Status: DC
  Administered 2022-11-20 – 2022-11-21 (×2): 40 mg via ORAL
  Filled 2022-11-19 (×2): qty 1

## 2022-11-19 MED ORDER — INSULIN ASPART 100 UNIT/ML IJ SOLN
0.0000 [IU] | Freq: Three times a day (TID) | INTRAMUSCULAR | Status: DC
  Administered 2022-11-20: 1 [IU] via SUBCUTANEOUS
  Administered 2022-11-20: 2 [IU] via SUBCUTANEOUS
  Administered 2022-11-21: 1 [IU] via SUBCUTANEOUS
  Administered 2022-11-21: 2 [IU] via SUBCUTANEOUS
  Administered 2022-11-21 – 2022-11-22 (×3): 1 [IU] via SUBCUTANEOUS
  Administered 2022-11-23: 2 [IU] via SUBCUTANEOUS
  Administered 2022-11-23: 1 [IU] via SUBCUTANEOUS

## 2022-11-19 MED ORDER — GUAIFENESIN-DM 100-10 MG/5ML PO SYRP
15.0000 mL | ORAL_SOLUTION | ORAL | Status: DC | PRN
  Administered 2022-11-20 – 2022-11-21 (×3): 15 mL via ORAL
  Filled 2022-11-19 (×3): qty 15

## 2022-11-19 MED ORDER — HYDROCODONE-ACETAMINOPHEN 10-325 MG PO TABS
1.0000 | ORAL_TABLET | ORAL | Status: DC | PRN
  Administered 2022-11-20 – 2022-11-23 (×16): 1 via ORAL
  Filled 2022-11-19 (×15): qty 1

## 2022-11-19 MED ORDER — EZETIMIBE 10 MG PO TABS
10.0000 mg | ORAL_TABLET | Freq: Every day | ORAL | Status: DC
  Administered 2022-11-20 – 2022-11-23 (×4): 10 mg via ORAL
  Filled 2022-11-19 (×4): qty 1

## 2022-11-19 MED ORDER — ACETAMINOPHEN 325 MG PO TABS: 650.0000 mg | ORAL_TABLET | Freq: Once | ORAL | Status: DC

## 2022-11-19 MED ORDER — ONDANSETRON HCL 4 MG PO TABS: 4.0000 mg | ORAL_TABLET | Freq: Four times a day (QID) | ORAL | Status: DC | PRN

## 2022-11-19 MED ORDER — ALBUTEROL SULFATE (2.5 MG/3ML) 0.083% IN NEBU: 2.5000 mg | INHALATION_SOLUTION | RESPIRATORY_TRACT | Status: DC | PRN

## 2022-11-19 MED ORDER — ACETAMINOPHEN 325 MG PO TABS
650.0000 mg | ORAL_TABLET | Freq: Four times a day (QID) | ORAL | Status: DC | PRN
  Administered 2022-11-20: 650 mg via ORAL
  Filled 2022-11-19: qty 2

## 2022-11-19 MED ORDER — ROSUVASTATIN CALCIUM 20 MG PO TABS
40.0000 mg | ORAL_TABLET | Freq: Every day | ORAL | Status: DC
  Administered 2022-11-20 – 2022-11-23 (×4): 40 mg via ORAL
  Filled 2022-11-19 (×4): qty 2

## 2022-11-19 MED ORDER — ONDANSETRON HCL 4 MG/2ML IJ SOLN
4.0000 mg | Freq: Four times a day (QID) | INTRAMUSCULAR | Status: DC | PRN
  Administered 2022-11-20 – 2022-11-22 (×2): 4 mg via INTRAVENOUS
  Filled 2022-11-19 (×2): qty 2

## 2022-11-19 MED ORDER — HYDROCODONE-ACETAMINOPHEN 10-325 MG PO TABS
1.0000 | ORAL_TABLET | Freq: Once | ORAL | Status: AC
  Filled 2022-11-19: qty 1

## 2022-11-19 MED ORDER — HEPARIN SODIUM (PORCINE) 5000 UNIT/ML IJ SOLN
5000.0000 [IU] | Freq: Three times a day (TID) | INTRAMUSCULAR | Status: DC
  Administered 2022-11-20 – 2022-11-23 (×10): 5000 [IU] via SUBCUTANEOUS
  Filled 2022-11-19 (×10): qty 1

## 2022-11-19 NOTE — Progress Notes (Signed)
   CT chest personally reviewed. Small amount of LLL pneumonia. Not overwhelming. Pt states he still feels poorly. Gave him the choice of staying in the hospital for overnight observation or discharge. He wants to stay in the hospital. Will admit overnight. Likely discharge to home tomorrow PM.  Carollee Herter, DO [facility]

## 2022-11-19 NOTE — Assessment & Plan Note (Signed)
Stable on toprol-xl 50 mg daily

## 2022-11-19 NOTE — Assessment & Plan Note (Signed)
Due to concern about pt taking all of his pain meds at one time today, tylenol level being checked. Will give 2 mg po dilaudid x 1 due to back pain.

## 2022-11-19 NOTE — Assessment & Plan Note (Signed)
Pt drinks 3 glasses of wine per day towards the end of the month when he has run out of hydrocodone to take. Pt does not want to change his opiate pain regimen. He has been referred pt pain management by his neurosurgeon.

## 2022-11-19 NOTE — Assessment & Plan Note (Signed)
Stable. On zetia and crestor. 

## 2022-11-19 NOTE — Assessment & Plan Note (Signed)
Follows with Dr. Thedore Mins with [facility]. Stable.

## 2022-11-19 NOTE — Assessment & Plan Note (Signed)
Follows with neurology. On primidone 100 mg qhs.

## 2022-11-19 NOTE — Assessment & Plan Note (Signed)
Chronic. On neurontin and hydrocodone. Follows with neurosurgery.

## 2022-11-19 NOTE — Assessment & Plan Note (Signed)
WBC 10.8. only small basilar infiltrate on Cxr. Already received IV Rocephin/Zithromax in ER. Checking CT chest without contrast to better define pneumonia. Could go either way. Admission for IV ABX vs discharge to home with PO abx and f/u with PCP. Discussed with pt and wife at bedside. Will await CT chest.

## 2022-11-19 NOTE — Assessment & Plan Note (Signed)
>>  ASSESSMENT AND PLAN FOR CHRONIC BACK PAIN WRITTEN ON 11/19/2022  8:13 PM BY CHEN, ERIC, DO  Chronic. On neurontin  and hydrocodone . Follows with neurosurgery.

## 2022-11-19 NOTE — H&P (Addendum)
History and Physical    BUZZ AXEL ZOX:[id number] DOB: November 30, 1950 DOA: 11/19/2022  DOS: the patient was seen and examined on 11/19/2022  PCP: [name], MD   Patient coming from: Home  I have personally briefly reviewed patient's old medical records in [facility]  CC: fever, cough, weakness HPI: 72 year old male history of diabetes, hypertension, chronic pain syndrome, chronic back pain, BPH presents to the ER today with a 2-week history of intermittent cough.  Is been productive for the last 2 to 3 days with green sputum.  Has felt feverish for 2 to 3 days but has not taken his temperature up until today.  Patient having shaking chills today.  Temperature recorded by the wife of 103.1.  Patient was feeling very weak.  EMS was called due to the wife being unable to take him to the hospital.  She states the patient was somewhat confused today and may have taken all of his home medications all at once including 4 doses of hydrocodone.  On arrival temp 100.4 heart rate 117 blood pressure 131/81 satting 91% on room air.  Labs showed normal urinalysis except for 100 protein  Sodium 139, potassium 3.5, BUN of 23, creatinine 1.25, glucose of 170  White count 10.8, hemoglobin 12.9, platelets of 100  Lactic acid normal 1.6,   COVID-negative  Ammonia normal at 27  Chest x-ray showed basilar opacity left lung base.  [facility] consulted.    Patient denies any diarrhea, abdominal pain.  Patient does drink a fair amount of alcohol towards the end the month as he frequently overuses his hydrocodone due to pain.  He states that he has talked to his neurosurgeon about this and that he wants to continue with his course instead of changing pain medications.  It appears that he has been referred to pain management.  Patient states that he has 3 glasses of wine a day towards the end of the month when he runs out of his hydrocodone.  He has never had any problems with  alcohol withdrawal.   ED Course: CXR shows small left basilar infiltrate. WBC 10.8  Review of Systems:  Review of Systems  Constitutional:  Positive for chills and fever.       Feverish for 3 days. Did not take temperature until today.  HENT: Negative.    Eyes: Negative.   Respiratory:  Positive for cough.        Occ green colored sputum  Cardiovascular: Negative.   Gastrointestinal: Negative.   Genitourinary: Negative.   Musculoskeletal:  Positive for back pain.       Chronic back pain  Skin: Negative.   Neurological: Negative.   Endo/Heme/Allergies: Negative.   Psychiatric/Behavioral: Negative.    All other systems reviewed and are negative.   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  removal scheduled to clear margins.   CHF (congestive heart failure)    no longer after weight loss   Chronic kidney disease    cysts   Degenerative disc disease    Depression    Diabetes mellitus    no longer diabetic-or on meds   DJD (degenerative joint disease)    Dysplastic nevus 02/04/2016   R upper back paraspinal - severe   Family history of adverse reaction to anesthesia  sons wake up combative   Foot fracture, left 01/2022   Gastric ulcer    GERD (gastroesophageal reflux disease)    H/O hiatal hernia    Headache(784.0)    sinus   Heart murmur    History of fusion of thoracic spine 12/28/2021   History of gastric bypass    Hx of congestive heart failure    Hyperlipidemia    Hypertension    Iron deficiency anemia 02/28/2015   Lumbar scoliosis    Opiate abuse, continuous 06/20/2015   S/P laparoscopic cholecystectomy 02/18/2020   Sacral fracture, closed    Seizures    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, after GI bleed   Sleep apnea    hx not now since wt loss   Stones in the urinary tract    Syncope and collapse      Past Surgical History:  Procedure Laterality Date   ANTERIOR CERVICAL DECOMP/DISCECTOMY FUSION  01/26/2012   Procedure: ANTERIOR CERVICAL DECOMPRESSION/DISCECTOMY FUSION 3 LEVELS;  Surgeon: [name], MD;  Location: [facility];  Service: Neurosurgery;  Laterality: N/A;  Cervical three-four Cervical four-five Cervical five-six Cervical six-seven , Anterior cervical decompression/diskectomy, fusion, plate   ANTERIOR LAT LUMBAR FUSION Right 04/25/2018   Procedure: Right Lumbar two-three Lumbar three-four Lumbar four-five anterior  lateral interbody fusion  with posterior percutaneous pedicle screws;  Surgeon: [name], MD;  Location: [facility] OR;  Service: Neurosurgery;  Laterality: Right;   APPENDECTOMY     CARDIAC CATHETERIZATION     [facility], normal   CARDIAC CATHETERIZATION     [place]   CHOLECYSTECTOMY     COLONOSCOPY WITH PROPOFOL N/A 12/27/2017   Procedure: COLONOSCOPY WITH PROPOFOL;  Surgeon: Toledo, [name], MD;  Location: [facility];  Service: Gastroenterology;  Laterality: N/A;   ESOPHAGOGASTRODUODENOSCOPY (EGD) WITH PROPOFOL N/A 12/27/2017   Procedure: ESOPHAGOGASTRODUODENOSCOPY (EGD) WITH PROPOFOL;  Surgeon: Toledo, [name], MD;  Location: [facility];  Service: Gastroenterology;  Laterality: N/A;   ESOPHAGOGASTRODUODENOSCOPY (EGD) WITH PROPOFOL N/A 06/19/2021   Procedure: ESOPHAGOGASTRODUODENOSCOPY (EGD) WITH PROPOFOL;  Surgeon: [name], MD;  Location: [facility];  Service: Gastroenterology;  Laterality: N/A;   ESOPHAGOGASTRODUODENOSCOPY (EGD) WITH PROPOFOL N/A 09/25/2021   Procedure: ESOPHAGOGASTRODUODENOSCOPY (EGD) WITH PROPOFOL;  Surgeon: [name], MD;  Location: [facility];  Service: Endoscopy;  Laterality: N/A;   GASTRIC BYPASS  08/09/2008   [employer]   HARDWARE REMOVAL N/A 03/08/2022   Procedure: Removal of bilateral Thoracic ten pedicle screws;  Surgeon: [name], MD;  Location: [facility] OR;  Service:  Neurosurgery;  Laterality: N/A;   HERNIA REPAIR  08/09/1998   hiatal   JOINT REPLACEMENT     KNEE ARTHROSCOPY     bilateral, left x 2   LAMINECTOMY WITH POSTERIOR LATERAL ARTHRODESIS LEVEL 4 N/A 01/14/2020   Procedure: Decompressive Laminectomy Lumbar One with pedicle screw fixation from Thoracic Ten to Lumbar Two;  Surgeon: [name], MD;  Location: [facility] OR;  Service: Neurosurgery;  Laterality: N/A;  Decompressive Laminectomy Lumbar One with pedicle screw fixation from Thoracic Ten to Lumbar Two   LUMBAR PERCUTANEOUS PEDICLE SCREW 3 LEVEL N/A 04/25/2018   Procedure: LUMBAR PERCUTANEOUS PEDICLE SCREW 3 LEVEL;  Surgeon: [name], MD;  Location: [facility] OR;  Service: Neurosurgery;  Laterality: N/A;   NASAL SINUS SURGERY     x5   OSTEOTOMY  08/10/1999   left   ROUX-EN-Y GASTRIC BYPASS     THORACIC LAMINECTOMY FOR EPIDURAL ABSCESS N/A 03/28/2022   Procedure: THORACIC WOUND WASHOUT;  Surgeon: [name], MD;  Location: [facility];  Service: Neurosurgery;  Laterality: N/A;   TONSILLECTOMY     TOTAL KNEE ARTHROPLASTY Left 05/09/2014   Dr. [name]   UVULOPALATOPLASTY  08/09/2009     reports that he has never smoked. He has never used smokeless tobacco. He reports current alcohol use of about 21.0 standard drinks of alcohol per week. He reports that he does not use drugs.  Allergies  Allergen Reactions   Altace [Ramipril] Anaphylaxis   Ace Inhibitors Hives   Levaquin [Levofloxacin In D5w] Hives   Lyrica [Pregabalin] Other (See Comments)    Edema   Metformin Diarrhea    High doses cause diarrhea.    Trulicity [Dulaglutide] Nausea And Vomiting    Family History  Problem Relation Age of Onset   Dementia Mother 89   Osteoporosis Mother    Heart disease Father 65   Diabetes Father    Lymphoma Sister        lymphoma, stage 4   Ovarian cancer Sister 46       Ovarian   Lupus Sister    Prostate cancer Maternal Uncle    Skin cancer Maternal Uncle    Tongue cancer Maternal Uncle         uncle died of heart attack   Alcoholism Maternal Uncle    Lung cancer Paternal Aunt        pat aunts x 4 died of lung cancer   Lung cancer Paternal Aunt    Lung cancer Paternal Aunt    Lung cancer Paternal Aunt    Mesothelioma Cousin        paternal cousin    Prior to Admission medications   Medication Sig Start Date End Date Taking? Authorizing Provider  acetaminophen (TYLENOL) 325 MG tablet Take 650 mg by mouth every 6 (six) hours as needed for moderate pain.    [provider]  blood glucose meter kit and supplies KIT Dispense based on patient and insurance preference. Check once daily. ICD doing E11.9. 06/30/16   [name], MD  Calcium Carbonate-Vitamin D (CALCIUM 600/VITAMIN D PO) Take 1 tablet by mouth daily.    [provider]  Cholecalciferol 50 MCG (2000 UT) TABS Take by mouth. 01/31/20   [provider]  cyclobenzaprine (FLEXERIL) 5 MG tablet TAKE 1 TABLET BY MOUTH EVERY 8 HOURS AS NEEDED FOR SPASMS. 11/10/22   [name], MD  esomeprazole (NEXIUM) 40 MG capsule TAKE 1 CAPSULE BY MOUTH ONCE DAILY 06/20/20   [name], MD  ezetimibe (ZETIA) 10 MG tablet Take 1 tablet (10 mg total) by mouth daily. 10/12/22   [name], MD  furosemide (LASIX) 20 MG tablet TAKE 1 TABLET BY MOUTH DAILY 09/06/22   [name], MD  gabapentin (NEURONTIN) 300 MG capsule Take 1 capsule (300 mg total) by mouth in the morning AND 2 capsules (600 mg total) daily with lunch AND 1 capsule (300 mg total) at bedtime. 08/11/22   [name], MD  HYDROcodone-acetaminophen (NORCO) 7.5-325 MG tablet Take 1 tablet by mouth every 6 (six) hours as needed. 09/22/22   [provider]  ketoconazole (NIZORAL) 2 % cream Apply 1 Application topically daily. 04/23/22   [name], MD  metoprolol succinate (TOPROL-XL) 50 MG 24 hr tablet TAKE 1 TABLET BY MOUTH DAILY WITH OR IMMEDIATLY FOLLOWING A MEAL 06/13/22   [name], MD  Multiple  Vitamin (MULTIVITAMIN) capsule Take 1 capsule by mouth daily.  With copper and zinc 07/11/18   [name], MD  nystatin-triamcinolone ointment [facility]) Apply 1 application topically 2 (two) times daily. Patient taking differently: Apply 1 application  topically daily as needed (irritation). 05/08/18   [name], MD  oxyCODONE-acetaminophen (PERCOCET) 7.5-325 MG tablet Take 1 tablet by mouth 2 (two) times daily as needed. Patient not taking: Reported on 11/10/2022    [provider]  primidone (MYSOLINE) 50 MG tablet Take 2 tablets (100 mg total) by mouth at bedtime. 10/20/22   Drema Dallas, DO  rosuvastatin (CRESTOR) 40 MG tablet TAKE ONE TABLET EVERY DAY 11/01/22   Iran Ouch, MD  sucralfate (CARAFATE) 1 g tablet TAKE 1 TABLET BY MOUTH 4 TIMES DAILY 09/06/22   [name], MD  tamsulosin (FLOMAX) 0.4 MG CAPS capsule Take 2 capsules (0.8 mg total) by mouth daily. 11/10/22   [name], MD  TRADJENTA 5 MG TABS tablet TAKE 1 TABLET BY MOUTH DAILY 06/15/22   [name], MD  venlafaxine XR (EFFEXOR-XR) 75 MG 24 hr capsule TAKE ONE CAPSULE EVERY MORNING WITH BREAKFAST 09/06/22   [name], MD    Physical Exam: Vitals:   11/19/22 1815 11/19/22 1830 11/19/22 1845 11/19/22 1900  BP: 138/80 (!) 142/79 128/74   Pulse: 99 (!) 101 99 98  Resp: 18 17 17 16   Temp:      TempSrc:      SpO2: 90% 97%  94%    Physical Exam Vitals and nursing note reviewed.  Constitutional:      General: He is not in acute distress.    Appearance: He is not toxic-appearing.  HENT:     Head: Normocephalic and atraumatic.  Cardiovascular:     Rate and Rhythm: Normal rate and regular rhythm.     Pulses: Normal pulses.     Comments: HR 95 on telemetry Pulmonary:     Effort: Pulmonary effort is normal.     Comments: Coarse BS bilateral bases Abdominal:     General: Bowel sounds are normal. There is no distension.     Palpations: Abdomen is soft.     Tenderness:  There is no abdominal tenderness.  Musculoskeletal:     Right lower leg: No edema.     Left lower leg: No edema.  Skin:    General: Skin is warm and dry.     Capillary Refill: Capillary refill takes less than 2 seconds.  Neurological:     General: No focal deficit present.     Mental Status: He is alert and oriented to person, place, and time.      Labs on Admission: I have personally reviewed following labs and imaging studies  CBC: Recent Labs  Lab 11/19/22 1435  WBC 10.8*  NEUTROABS 10.1*  HGB 12.9*  HCT 40.8  MCV 108.8*  PLT 100*   Basic Metabolic Panel: Recent Labs  Lab 11/19/22 1435  NA 139  K 3.5  CL 108  CO2 23  GLUCOSE 170*  BUN 23  CREATININE 1.25*  CALCIUM 8.0*   GFR: Estimated Creatinine Clearance: 53.1 mL/min (A) (by C-G formula based on SCr of 1.25 mg/dL (H)). Liver Function Tests: Recent Labs  Lab 11/19/22 1435  AST 52*  ALT 60*  ALKPHOS 50  BILITOT 0.7  PROT 5.8*  ALBUMIN 2.9*   No results for input(s): "LIPASE", "AMYLASE" in the last 168 hours. Recent Labs  Lab 11/19/22 1635  AMMONIA 27   Coagulation Profile: Recent Labs  Lab 11/19/22 1435  INR 1.1   Urine analysis:    Component Value Date/Time   COLORURINE YELLOW 11/19/2022 1846   APPEARANCEUR CLEAR 11/19/2022 1846   APPEARANCEUR Clear 05/01/2014 0928   LABSPEC 1.018 11/19/2022 1846   LABSPEC 1.031 05/01/2014 0928   PHURINE 5.0 11/19/2022 1846   GLUCOSEU NEGATIVE 11/19/2022 1846   GLUCOSEU Negative 05/01/2014 0928   HGBUR NEGATIVE 11/19/2022 1846   BILIRUBINUR NEGATIVE 11/19/2022 1846   BILIRUBINUR neg 01/11/2022 1147   BILIRUBINUR Negative 05/01/2014 0928   KETONESUR NEGATIVE 11/19/2022 1846   PROTEINUR 100 (A) 11/19/2022 1846   UROBILINOGEN 0.2 01/11/2022 1147   NITRITE NEGATIVE 11/19/2022 1846   LEUKOCYTESUR NEGATIVE 11/19/2022 1846   LEUKOCYTESUR Negative 05/01/2014 0928    Radiological Exams on Admission: I have personally reviewed images DG Chest 2  View  Result Date: 11/19/2022 CLINICAL DATA:  Questionable sepsis, weakness, possible overdose EXAM: CHEST - 2 VIEW COMPARISON:  09/01/2022 FINDINGS: Cardiomegaly. Heterogeneous airspace opacity of the left lung base. Mild diffuse interstitial opacity. Disc degenerative disease of the thoracic spine. IMPRESSION: 1. Heterogeneous airspace opacity of the left lung base, concerning for infection or aspiration. 2. Mild diffuse interstitial opacity, likely edema. 3. Cardiomegaly. 4. Disc degenerative disease of the thoracic spine. Electronically Signed   By: Jearld Lesch M.D.   On: 11/19/2022 16:56    EKG: My personal interpretation of EKG shows: sinus tachycardia    Assessment/Plan Principal Problem:   LLL pneumonia Active Problems:   Type 2 diabetes mellitus   Essential hypertension, benign   Alcohol abuse   BPH (benign prostatic hyperplasia)   Chronic pain syndrome   CKD stage 3a, GFR 45-59 ml/min   Chronic back pain   Tremor   Hyperlipidemia    Assessment and Plan: * LLL pneumonia WBC 10.8. only small basilar infiltrate on Cxr. Already received IV Rocephin/Zithromax in ER. Checking CT chest without contrast to better define pneumonia. Could go either way. Admission for IV ABX vs discharge to home with PO abx and f/u with PCP. Discussed with pt and wife at bedside. Will await CT chest.  **CT chest shows LLL pneumonia. Pt wants to stay in the hospital. Will admit overnight.  Tremor Follows with neurology. On primidone 100 mg qhs.  Chronic back pain Chronic. On neurontin and hydrocodone. Follows with neurosurgery.  CKD stage 3a, GFR 45-59 ml/min Follows with Dr. Thedore Mins with [facility]. Stable.  Chronic pain syndrome Due to concern about pt taking all of his pain meds at one time today, tylenol level being checked. Will give 2 mg po dilaudid x 1 due to back pain.  BPH (benign prostatic hyperplasia) Stable on flomax 0.4 mg  Alcohol abuse Pt drinks 3 glasses of wine  per day towards the end of the month when he has run out of hydrocodone to take. Pt does not want to change his opiate pain regimen. He has been referred pt pain management by his neurosurgeon.  Essential hypertension, benign Stable on toprol-xl 50 mg daily  Type 2 diabetes mellitus On tradjenta 5 mg daily at home.  Hyperlipidemia Stable. On zetia and crestor.   Code Status: Full Code Family Communication: discussed with pt and wife beverely at bedside  Disposition Plan: home at discharge  Consults called: none  Admission status: observation med/surg  hospital admission,    Carollee Herter, DO [facility] 11/19/2022, 8:18 PM

## 2022-11-19 NOTE — Subjective & Objective (Signed)
CC: fever, cough, weakness HPI: 72 year old male history of diabetes, hypertension, chronic pain syndrome, chronic back pain, BPH presents to the ER today with a 2-week history of intermittent cough.  Is been productive for the last 2 to 3 days with green sputum.  Has felt feverish for 2 to 3 days but has not taken his temperature up until today.  Patient having shaking chills today.  Temperature recorded by the wife of 103.1.  Patient was feeling very weak.  EMS was called due to the wife being unable to take him to the hospital.  She states the patient was somewhat confused today and may have taken all of his home medications all at once including 4 doses of hydrocodone.  On arrival temp 100.4 heart rate 117 blood pressure 131/81 satting 91% on room air.  Labs showed normal urinalysis except for 100 protein  Sodium 139, potassium 3.5, BUN of 23, creatinine 1.25, glucose of 170  White count 10.8, hemoglobin 12.9, platelets of 100  Lactic acid normal 1.6,   COVID-negative  Ammonia normal at 27  Chest x-ray showed basilar opacity left lung base.  [facility] hospitalist consulted.    Patient denies any diarrhea, abdominal pain.  Patient does drink a fair amount of alcohol towards the end the month as he frequently overuses his hydrocodone due to pain.  He states that he has talked to his neurosurgeon about this and that he wants to continue with his course instead of changing pain medications.  It appears that he has been referred to pain management.  Patient states that he has 3 glasses of wine a day towards the end of the month when he runs out of his hydrocodone.  He has never had any problems with alcohol withdrawal.

## 2022-11-19 NOTE — ED Notes (Signed)
ED TO INPATIENT HANDOFF REPORT  ED Nurse Name and Phone #:   S Name/Age/Gender Justin Patel 72 y.o. male Room/Bed: 034C/034C  Code Status   Code Status: Full Code  Home/SNF/Other Home Patient oriented to: self, place, time, and situation Is this baseline? Yes   Triage Complete: Triage complete  Chief Complaint LLL pneumonia [J18.9]  Triage Note Patient BIB EMS from home  for AMS and weakness. Patient took all pain medications at once that was for today and tomorrow unaware of how medication that was taken this morning.  Temp. 103.1, Dx. UTI 11/10/2022 was not given ABT. Denies pain. C/o weakness. 18 left AC.  500mg  IVF, Ace 650mg . 160/100, 91% 2liters, CBG 157.    Allergies Allergies  Allergen Reactions   Altace [Ramipril] Anaphylaxis   Ace Inhibitors Hives   Levaquin [Levofloxacin In D5w] Hives   Lyrica [Pregabalin] Other (See Comments)    Edema   Metformin Diarrhea    High doses cause diarrhea.    Trulicity [Dulaglutide] Nausea And Vomiting    Level of Care/Admitting Diagnosis ED Disposition     ED Disposition  Admit   Condition  --   Comment  Hospital Area: [facility] [[id number]]  Level of Care: Med-Surg [16]  May place patient in observation at [facility] or [facility] if equivalent level of care is available:: No  Covid Evaluation: Asymptomatic - no recent exposure (last 10 days) testing not required  Diagnosis: LLL pneumonia [098119]  Admitting Physician: Imogene Burn, ERIC [[id number]]  Attending Physician: Imogene Burn, ERIC [[id number]]          B Medical/Surgery History Past Medical History:  Diagnosis Date   Anemia    iron def anemia after gastric bypass   Anxiety    Arthritis    Cancer 02/01/2016   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  removal scheduled to clear margins.   CHF (congestive heart failure)    no longer after weight loss   Chronic kidney disease    cysts   Degenerative disc disease    Depression    Diabetes  mellitus    no longer diabetic-or on meds   DJD (degenerative joint disease)    Dysplastic nevus 02/04/2016   R upper back paraspinal - severe   Family history of adverse reaction to anesthesia    sons wake up combative   Foot fracture, left 01/2022   Gastric ulcer    GERD (gastroesophageal reflux disease)    H/O hiatal hernia    Headache(784.0)    sinus   Heart murmur    History of fusion of thoracic spine 12/28/2021   History of gastric bypass    Hx of congestive heart failure    Hyperlipidemia    Hypertension    Iron deficiency anemia 02/28/2015   Lumbar scoliosis    Opiate abuse, continuous 06/20/2015   S/P laparoscopic cholecystectomy 02/18/2020   Sacral fracture, closed    Seizures    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, after GI bleed   Sleep apnea    hx not now since wt loss   Stones in the urinary tract    Syncope and collapse    Past Surgical History:  Procedure Laterality Date   ANTERIOR CERVICAL DECOMP/DISCECTOMY FUSION  01/26/2012   Procedure: ANTERIOR CERVICAL DECOMPRESSION/DISCECTOMY FUSION 3 LEVELS;  Surgeon: [name], MD;  Location: [facility];  Service: Neurosurgery;  Laterality: N/A;  Cervical three-four Cervical four-five Cervical five-six Cervical six-seven , Anterior cervical  decompression/diskectomy, fusion, plate   ANTERIOR LAT LUMBAR FUSION Right 04/25/2018   Procedure: Right Lumbar two-three Lumbar three-four Lumbar four-five anterior  lateral interbody fusion  with posterior percutaneous pedicle screws;  Surgeon: [name], MD;  Location: [facility] OR;  Service: Neurosurgery;  Laterality: Right;   APPENDECTOMY     CARDIAC CATHETERIZATION     [facility], normal   CARDIAC CATHETERIZATION     [place]   CHOLECYSTECTOMY     COLONOSCOPY WITH PROPOFOL N/A 12/27/2017   Procedure: COLONOSCOPY WITH PROPOFOL;  Surgeon: Toledo, [name], MD;  Location: [facility];  Service: Gastroenterology;  Laterality: N/A;    ESOPHAGOGASTRODUODENOSCOPY (EGD) WITH PROPOFOL N/A 12/27/2017   Procedure: ESOPHAGOGASTRODUODENOSCOPY (EGD) WITH PROPOFOL;  Surgeon: Toledo, [name], MD;  Location: [facility] ENDOSCOPY;  Service: Gastroenterology;  Laterality: N/A;   ESOPHAGOGASTRODUODENOSCOPY (EGD) WITH PROPOFOL N/A 06/19/2021   Procedure: ESOPHAGOGASTRODUODENOSCOPY (EGD) WITH PROPOFOL;  Surgeon: [name], MD;  Location: [facility]COPY;  Service: Gastroenterology;  Laterality: N/A;   ESOPHAGOGASTRODUODENOSCOPY (EGD) WITH PROPOFOL N/A 09/25/2021   Procedure: ESOPHAGOGASTRODUODENOSCOPY (EGD) WITH PROPOFOL;  Surgeon: [name], MD;  Location: [facility] ENDOSCOPY;  Service: Endoscopy;  Laterality: N/A;   GASTRIC BYPASS  08/09/2008   [employer]   HARDWARE REMOVAL N/A 03/08/2022   Procedure: Removal of bilateral Thoracic ten pedicle screws;  Surgeon: [name], MD;  Location: [facility] OR;  Service: Neurosurgery;  Laterality: N/A;   HERNIA REPAIR  08/09/1998   hiatal   JOINT REPLACEMENT     KNEE ARTHROSCOPY     bilateral, left x 2   LAMINECTOMY WITH POSTERIOR LATERAL ARTHRODESIS LEVEL 4 N/A 01/14/2020   Procedure: Decompressive Laminectomy Lumbar One with pedicle screw fixation from Thoracic Ten to Lumbar Two;  Surgeon: [name], MD;  Location: [facility] OR;  Service: Neurosurgery;  Laterality: N/A;  Decompressive Laminectomy Lumbar One with pedicle screw fixation from Thoracic Ten to Lumbar Two   LUMBAR PERCUTANEOUS PEDICLE SCREW 3 LEVEL N/A 04/25/2018   Procedure: LUMBAR PERCUTANEOUS PEDICLE SCREW 3 LEVEL;  Surgeon: [name], MD;  Location: [facility] OR;  Service: Neurosurgery;  Laterality: N/A;   NASAL SINUS SURGERY     x5   OSTEOTOMY  08/10/1999   left   ROUX-EN-Y GASTRIC BYPASS     THORACIC LAMINECTOMY FOR EPIDURAL ABSCESS N/A 03/28/2022   Procedure: THORACIC WOUND WASHOUT;  Surgeon: [name], MD;  Location: [facility] OR;  Service: Neurosurgery;  Laterality: N/A;   TONSILLECTOMY     TOTAL KNEE ARTHROPLASTY Left  05/09/2014   Dr. [name]   UVULOPALATOPLASTY  08/09/2009     A IV Location/Drains/Wounds Patient Lines/Drains/Airways Status     Active Line/Drains/Airways     Name Placement date Placement time Site Days   Peripheral IV 18 G Anterior;Distal;Left;Upper Arm --  --  Arm  --   Peripheral IV 11/19/22 18 G Anterior;Distal;Right;Upper Arm 11/19/22  1640  Arm  less than 1   Incision (Closed) 03/28/22 Back Other (Comment) 03/28/22  2053  -- 236            Intake/Output Last 24 hours No intake or output data in the 24 hours ending 11/19/22 2230  Labs/Imaging Results for orders placed or performed during the hospital encounter of 11/19/22 (from the past 48 hour(s))  Comprehensive metabolic panel     Status: Abnormal   Collection Time: 11/19/22  2:35 PM  Result Value Ref Range   Sodium 139 135 - 145 mmol/L   Potassium 3.5 3.5 - 5.1 mmol/L  Chloride 108 98 - 111 mmol/L   CO2 23 22 - 32 mmol/L   Glucose, Bld 170 (H) 70 - 99 mg/dL    Comment: Glucose reference range applies only to samples taken after fasting for at least 8 hours.   BUN 23 8 - 23 mg/dL   Creatinine, Ser 1.61 (H) 0.61 - 1.24 mg/dL   Calcium 8.0 (L) 8.9 - 10.3 mg/dL   Total Protein 5.8 (L) 6.5 - 8.1 g/dL   Albumin 2.9 (L) 3.5 - 5.0 g/dL   AST 52 (H) 15 - 41 U/L   ALT 60 (H) 0 - 44 U/L   Alkaline Phosphatase 50 38 - 126 U/L   Total Bilirubin 0.7 0.3 - 1.2 mg/dL   GFR, Estimated >09 >60 mL/min    Comment: (NOTE) Calculated using the CKD-EPI Creatinine Equation (2021)    Anion gap 8 5 - 15    Comment: Performed at [facility] Lab, [address], [place], [place] [address]  CBC with Differential     Status: Abnormal   Collection Time: 11/19/22  2:35 PM  Result Value Ref Range   WBC 10.8 (H) 4.0 - 10.5 K/uL   RBC 3.75 (L) 4.22 - 5.81 MIL/uL   Hemoglobin 12.9 (L) 13.0 - 17.0 g/dL   HCT 81.1 91.4 - 78.2 %   MCV 108.8 (H) 80.0 - 100.0 fL   MCH 34.4 (H) 26.0 - 34.0 pg   MCHC 31.6 30.0 - 36.0 g/dL   RDW 95.6  21.3 - 08.6 %   Platelets 100 (L) 150 - 400 K/uL   nRBC 0.0 0.0 - 0.2 %   Neutrophils Relative % 93 %   Neutro Abs 10.1 (H) 1.7 - 7.7 K/uL   Lymphocytes Relative 2 %   Lymphs Abs 0.2 (L) 0.7 - 4.0 K/uL   Monocytes Relative 4 %   Monocytes Absolute 0.4 0.1 - 1.0 K/uL   Eosinophils Relative 0 %   Eosinophils Absolute 0.0 0.0 - 0.5 K/uL   Basophils Relative 1 %   Basophils Absolute 0.1 0.0 - 0.1 K/uL   Immature Granulocytes 0 %   Abs Immature Granulocytes 0.04 0.00 - 0.07 K/uL    Comment: Performed at [facility], [address], [place], [place] [address]  Protime-INR     Status: None   Collection Time: 11/19/22  2:35 PM  Result Value Ref Range   Prothrombin Time 14.1 11.4 - 15.2 seconds   INR 1.1 0.8 - 1.2    Comment: (NOTE) INR goal varies based on device and disease states. Performed at [facility], [address], [place], [place] [address]   APTT     Status: None   Collection Time: 11/19/22  2:35 PM  Result Value Ref Range   aPTT 27 24 - 36 seconds    Comment: Performed at [facility], [address], [place], [place] [address]  SARS Coronavirus 2 by RT PCR (hospital order, performed in [facility] hospital lab) *cepheid single result test*     Status: None   Collection Time: 11/19/22  4:16 PM   Specimen: Nasal Swab  Result Value Ref Range   SARS Coronavirus 2 by RT PCR NEGATIVE NEGATIVE    Comment: Performed at [facility], [address], [place], [place] [address]  Ammonia     Status: None   Collection Time: 11/19/22  4:35 PM  Result Value Ref Range   Ammonia 27 9 - 35  umol/L    Comment: Performed at [facility], [address], [place], [place] [address]  Acetaminophen level     Status: None   Collection Time: 11/19/22  4:35 PM  Result Value Ref Range   Acetaminophen (Tylenol), Serum 13 10 - 30 ug/mL    Comment: (NOTE) Therapeutic concentrations vary significantly. A range of 10-30 ug/mL  may be an effective concentration for  many patients. However, some  are best treated at concentrations outside of this range. Acetaminophen concentrations >150 ug/mL at 4 hours after ingestion  and >50 ug/mL at 12 hours after ingestion are often associated with  toxic reactions.  Performed at [facility], [address], [place], [place] [address]   Salicylate level     Status: Abnormal   Collection Time: 11/19/22  4:35 PM  Result Value Ref Range   Salicylate Lvl <7.0 (L) 7.0 - 30.0 mg/dL    Comment: Performed at [facility], [address], [place], [place] [address]  Lactic acid, plasma     Status: None   Collection Time: 11/19/22  4:48 PM  Result Value Ref Range   Lactic Acid, Venous 1.6 0.5 - 1.9 mmol/L    Comment: Performed at [facility] Lab, [address] [place], [place] [address]  Urinalysis, w/ Reflex to Culture (Infection Suspected) -Urine, Clean Catch     Status: Abnormal   Collection Time: 11/19/22  6:46 PM  Result Value Ref Range   Specimen Source URINE, CLEAN CATCH    Color, Urine YELLOW YELLOW   APPearance CLEAR CLEAR   Specific Gravity, Urine 1.018 1.005 - 1.030   pH 5.0 5.0 - 8.0   Glucose, UA NEGATIVE NEGATIVE mg/dL   Hgb urine dipstick NEGATIVE NEGATIVE   Bilirubin Urine NEGATIVE NEGATIVE   Ketones, ur NEGATIVE NEGATIVE mg/dL   Protein, ur 606 (A) NEGATIVE mg/dL   Nitrite NEGATIVE NEGATIVE   Leukocytes,Ua NEGATIVE NEGATIVE   RBC / HPF 0-5 0 - 5 RBC/hpf   WBC, UA 0-5 0 - 5 WBC/hpf    Comment:        Reflex urine culture not performed if WBC <=10, OR if Squamous epithelial cells >5. If Squamous epithelial cells >5 suggest recollection.    Bacteria, UA NONE SEEN NONE SEEN   Squamous Epithelial / HPF 0-5 0 - 5 /HPF   Mucus PRESENT     Comment: Performed at [facility], [address] [place], [place] [address]   CT Chest Wo Contrast  Result Date: 11/19/2022 CLINICAL DATA:  Weakness; drug overdose; altered mental status EXAM: CT CHEST WITHOUT CONTRAST TECHNIQUE:  Multidetector CT imaging of the chest was performed following the standard protocol without IV contrast. RADIATION DOSE REDUCTION: This exam was performed according to the departmental dose-optimization program which includes automated exposure control, adjustment of the mA and/or kV according to patient size and/or use of iterative reconstruction technique. COMPARISON:  Chest radiographs 11/19/2022 and CT chest 11/07/2017 FINDINGS: Cardiovascular: Normal heart size. Coronary artery and aortic atherosclerotic calcification. No pericardial effusion. The main pulmonary artery is dilated up to 38 mm in diameter. Mediastinum/Nodes: Mild circumferential thickening of the esophagus. Small hiatal hernia. Postoperative changes about the GE junction and proximal stomach. Calcified mediastinal and hilar nodes are unchanged. Lungs/Pleura: Patchy centrilobular predominant ground-glass and consolidative opacities in the right middle lobe, left upper lobe and greatest in the left lower lobe. Mild associated bronchial wall thickening and bronchiolectasis. No pleural effusion or pneumothorax. Scattered  calcified granulomas. Upper Abdomen: Cholecystectomy.  No acute abnormality. Musculoskeletal: Remote left rib fractures. Cervical spine and thoracolumbar spine fusion hardware. No acute osseous abnormality. Thoracic spondylosis. Remote sternal fracture. IMPRESSION: 1. Findings compatible with left-sided predominant bronchopneumonia. 2. Mild circumferential thickening of the esophagus, which can be seen in the setting of esophagitis. Small hiatal hernia. Recommend correlation for reflux. 3. Dilated main pulmonary artery, which can be seen in the setting of pulmonary hypertension Aortic Atherosclerosis (ICD10-I70.0). Electronically Signed   By: Minerva Fester M.D.   On: 11/19/2022 21:52   DG Chest 2 View  Result Date: 11/19/2022 CLINICAL DATA:  Questionable sepsis, weakness, possible overdose EXAM: CHEST - 2 VIEW COMPARISON:   09/01/2022 FINDINGS: Cardiomegaly. Heterogeneous airspace opacity of the left lung base. Mild diffuse interstitial opacity. Disc degenerative disease of the thoracic spine. IMPRESSION: 1. Heterogeneous airspace opacity of the left lung base, concerning for infection or aspiration. 2. Mild diffuse interstitial opacity, likely edema. 3. Cardiomegaly. 4. Disc degenerative disease of the thoracic spine. Electronically Signed   By: Jearld Lesch M.D.   On: 11/19/2022 16:56    Pending Labs Unresulted Labs (From admission, onward)     Start     Ordered   11/19/22 1614  Lactic acid, plasma  (Undifferentiated presentation (screening labs and basic nursing orders))  Now then every 2 hours,   R (with STAT occurrences)      11/19/22 1615   11/19/22 1614  Blood Culture (routine x 2)  (Undifferentiated presentation (screening labs and basic nursing orders))  BLOOD CULTURE X 2,   STAT      11/19/22 1615   Signed and Held  Comprehensive metabolic panel  Tomorrow morning,   R        Signed and Held   Signed and Held  CBC with Differential/Platelet  Tomorrow morning,   R        Signed and Held   Signed and Held  Magnesium  Tomorrow morning,   R        Signed and Held   Signed and Held  Legionella Pneumophila Serogp 1 Ur Ag  (COPD / Pneumonia / Cellulitis / Lower Extremity Wound)  Once,   R        Signed and Held   Signed and Held  Strep pneumoniae urinary antigen  (COPD / Pneumonia / Cellulitis / Lower Extremity Wound)  Once,   R        Signed and Held            Vitals/Pain Today's Vitals   11/19/22 1900 11/19/22 2034 11/19/22 2100 11/19/22 2229  BP:   (!) 143/81   Pulse: 98  95   Resp: 16  16   Temp:    98 F (36.7 C)  TempSrc:      SpO2: 94%  95%   PainSc:  6       Isolation Precautions No active isolations  Medications Medications  HYDROcodone-acetaminophen (NORCO) 10-325 MG per tablet 1 tablet (has no administration in time range)  cefTRIAXone (ROCEPHIN) 1 g in sodium chloride 0.9 %  100 mL IVPB (0 g Intravenous Stopped 11/19/22 1731)  azithromycin (ZITHROMAX) 500 mg in sodium chloride 0.9 % 250 mL IVPB (0 mg Intravenous Stopped 11/19/22 1828)  sodium chloride 0.9 % bolus 1,000 mL (0 mLs Intravenous Stopped 11/19/22 1808)  ondansetron (ZOFRAN) injection 4 mg (4 mg Intravenous Given 11/19/22 1749)  ondansetron (ZOFRAN) injection 4 mg (4 mg Intravenous Given 11/19/22 2029)  HYDROmorphone (DILAUDID) tablet  2 mg (2 mg Oral Given 11/19/22 2029)    Mobility walks     Focused Assessments    R Recommendations: See Admitting Provider Note  Report given to:   Additional Notes:

## 2022-11-19 NOTE — ED Provider Notes (Signed)
EMERGENCY DEPARTMENT AT [facility] Provider Note   CSN: [id number] Arrival date & time: 11/19/22  1604     History  Chief Complaint  Patient presents with   Weakness   Drug Overdose   Altered Mental Status    Justin Patel is a 72 y.o. male.  Patient with type 2 diabetes, hypertension, status post gastric bypass, CAD status post catheterization, history of alcohol abuse, C3-7 fusion, GI bleed and post traumatic subdural hematoma, on chronic hydrocodone and gabapentin --presents to the emergency department by EMS today.  Patient has had a longstanding cough.  He had a particularly long and persistent coughing spell this morning.  Wife states that he gets a lot of mucus in his throat and then spits it out.  Patient then went back to sleep and wife checked on him several times.  He then awoke and needed to use the restroom.  He was extremely weak and needed a lot of help getting to the restroom.  He felt very warm and had a fever of 103 F at home.  They felt that he should seek medical attention and considered taking him to an urgent care, however he was too weak for him to do this by himself.  EMS was called.  They transported patient to the hospital.  Reported fever, gave Tylenol and 500 cc IV fluids.  Patient has been somewhat confused although he is conversant.  Reports back pain, however patient is wife states that he has been complaining of his chronic back pain.  Patient's wife states that he was started on a new prescription of hydrocodone 2 days ago.  Patient apparently was confused and took all of his daily medications today at 1 time.  This may include 3 doses of pain medication and up to 1200 mg (4 tablets) of gabapentin.       Home Medications Prior to Admission medications   Medication Sig Start Date End Date Taking? Authorizing Provider  acetaminophen (TYLENOL) 325 MG tablet Take 650 mg by mouth every 6 (six) hours as needed for moderate pain.     [provider]  blood glucose meter kit and supplies KIT Dispense based on patient and insurance preference. Check once daily. ICD doing E11.9. 06/30/16   [name], MD  Calcium Carbonate-Vitamin D (CALCIUM 600/VITAMIN D PO) Take 1 tablet by mouth daily.    [provider]  Cholecalciferol 50 MCG (2000 UT) TABS Take by mouth. 01/31/20   [provider]  cyclobenzaprine (FLEXERIL) 5 MG tablet TAKE 1 TABLET BY MOUTH EVERY 8 HOURS AS NEEDED FOR SPASMS. 11/10/22   [name], MD  esomeprazole (NEXIUM) 40 MG capsule TAKE 1 CAPSULE BY MOUTH ONCE DAILY 06/20/20   [name], MD  ezetimibe (ZETIA) 10 MG tablet Take 1 tablet (10 mg total) by mouth daily. 10/12/22   [name], MD  furosemide (LASIX) 20 MG tablet TAKE 1 TABLET BY MOUTH DAILY 09/06/22   [name], MD  gabapentin (NEURONTIN) 300 MG capsule Take 1 capsule (300 mg total) by mouth in the morning AND 2 capsules (600 mg total) daily with lunch AND 1 capsule (300 mg total) at bedtime. 08/11/22   [name], MD  HYDROcodone-acetaminophen (NORCO) 7.5-325 MG tablet Take 1 tablet by mouth every 6 (six) hours as needed. 09/22/22   [provider]  ketoconazole (NIZORAL) 2 % cream Apply 1 Application topically daily. 04/23/22   [name], MD  metoprolol succinate (TOPROL-XL) 50 MG 24 hr tablet TAKE 1 TABLET BY MOUTH DAILY WITH OR IMMEDIATLY FOLLOWING A MEAL 06/13/22   [name], MD  Multiple Vitamin (MULTIVITAMIN) capsule Take 1 capsule by mouth daily. With copper and zinc 07/11/18   [name], MD  nystatin-triamcinolone ointment [facility]) Apply 1 application topically 2 (two) times daily. Patient taking differently: Apply 1 application  topically daily as needed (irritation). 05/08/18   [name], MD  oxyCODONE-acetaminophen (PERCOCET) 7.5-325 MG tablet Take 1 tablet by mouth 2 (two) times daily as needed. Patient not taking: Reported on 11/10/2022     [provider]  primidone (MYSOLINE) 50 MG tablet Take 2 tablets (100 mg total) by mouth at bedtime. 10/20/22   Drema Dallas, DO  rosuvastatin (CRESTOR) 40 MG tablet TAKE ONE TABLET EVERY DAY 11/01/22   Iran Ouch, MD  sucralfate (CARAFATE) 1 g tablet TAKE 1 TABLET BY MOUTH 4 TIMES DAILY 09/06/22   [name], MD  tamsulosin (FLOMAX) 0.4 MG CAPS capsule Take 2 capsules (0.8 mg total) by mouth daily. 11/10/22   [name], MD  TRADJENTA 5 MG TABS tablet TAKE 1 TABLET BY MOUTH DAILY 06/15/22   [name], MD  venlafaxine XR (EFFEXOR-XR) 75 MG 24 hr capsule TAKE ONE CAPSULE EVERY MORNING WITH BREAKFAST 09/06/22   [name], MD      Allergies    Altace [ramipril], Ace inhibitors, Levaquin [levofloxacin in d5w], Lyrica [pregabalin], Metformin, and Trulicity [dulaglutide]    Review of Systems   Review of Systems  Physical Exam Updated Vital Signs BP 131/88   Pulse (!) 117   Resp 16   SpO2 91%  Physical Exam Vitals and nursing note reviewed.  Constitutional:      General: He is not in acute distress.    Appearance: He is well-developed.  HENT:     Head: Normocephalic and atraumatic.     Right Ear: External ear normal.     Left Ear: External ear normal.     Nose: Nose normal.     Mouth/Throat:     Mouth: Mucous membranes are moist.  Eyes:     General:        Right eye: No discharge.        Left eye: No discharge.     Conjunctiva/sclera: Conjunctivae normal.  Cardiovascular:     Rate and Rhythm: Regular rhythm. Tachycardia present.     Heart sounds: Normal heart sounds.  Pulmonary:     Effort: Pulmonary effort is normal.     Breath sounds: Rales present.     Comments: Crackles at bilateral bases, left greater than right. Abdominal:     Palpations: Abdomen is soft.     Tenderness: There is no abdominal tenderness. There is no guarding or rebound.  Musculoskeletal:     Cervical back: Normal range of motion and neck supple.     Right  lower leg: No edema.     Left lower leg: No edema.  Skin:    General: Skin is warm and dry.  Neurological:     Mental Status: He is alert.     ED Results / Procedures / Treatments   Labs (all labs ordered are listed, but only abnormal results are displayed) Labs Reviewed  COMPREHENSIVE METABOLIC PANEL - Abnormal; Notable for the following components:      Result Value   Glucose, Bld 170 (*)    Creatinine, Ser 1.25 (*)  Calcium 8.0 (*)    Total Protein 5.8 (*)    Albumin 2.9 (*)    AST 52 (*)    ALT 60 (*)    All other components within normal limits  CBC WITH DIFFERENTIAL/PLATELET - Abnormal; Notable for the following components:   WBC 10.8 (*)    RBC 3.75 (*)    Hemoglobin 12.9 (*)    MCV 108.8 (*)    MCH 34.4 (*)    Platelets 100 (*)    Neutro Abs 10.1 (*)    Lymphs Abs 0.2 (*)    All other components within normal limits  URINALYSIS, W/ REFLEX TO CULTURE (INFECTION SUSPECTED) - Abnormal; Notable for the following components:   Protein, ur 100 (*)    All other components within normal limits  SARS CORONAVIRUS 2 BY RT PCR  CULTURE, BLOOD (ROUTINE X 2)  CULTURE, BLOOD (ROUTINE X 2)  LACTIC ACID, PLASMA  PROTIME-INR  APTT  AMMONIA  LACTIC ACID, PLASMA  ACETAMINOPHEN LEVEL  SALICYLATE LEVEL    EKG EKG Interpretation  Date/Time:  Friday November 19 2022 16:11:54 EDT Ventricular Rate:  117 PR Interval:  156 QRS Duration: 70 QT Interval:  330 QTC Calculation: 461 R Axis:   27 Text Interpretation: Sinus tachycardia Multiple ventricular premature complexes Borderline abnrm T, anterolateral leads Confirmed by Kristine Royal [phone]) on 11/19/2022 5:32:55 PM  Radiology DG Chest 2 View  Result Date: 11/19/2022 CLINICAL DATA:  Questionable sepsis, weakness, possible overdose EXAM: CHEST - 2 VIEW COMPARISON:  09/01/2022 FINDINGS: Cardiomegaly. Heterogeneous airspace opacity of the left lung base. Mild diffuse interstitial opacity. Disc degenerative disease of the  thoracic spine. IMPRESSION: 1. Heterogeneous airspace opacity of the left lung base, concerning for infection or aspiration. 2. Mild diffuse interstitial opacity, likely edema. 3. Cardiomegaly. 4. Disc degenerative disease of the thoracic spine. Electronically Signed   By: Jearld Lesch M.D.   On: 11/19/2022 16:56    Procedures Procedures    Medications Ordered in ED Medications  cefTRIAXone (ROCEPHIN) 1 g in sodium chloride 0.9 % 100 mL IVPB (0 g Intravenous Stopped 11/19/22 1731)  azithromycin (ZITHROMAX) 500 mg in sodium chloride 0.9 % 250 mL IVPB (0 mg Intravenous Stopped 11/19/22 1828)  sodium chloride 0.9 % bolus 1,000 mL (0 mLs Intravenous Stopped 11/19/22 1808)  ondansetron (ZOFRAN) injection 4 mg (4 mg Intravenous Given 11/19/22 1749)    ED Course/ Medical Decision Making/ A&P    Patient seen and examined. History obtained directly from patient.    Labs/EKG: Sepsis workup ordered including PT/INR which is checked for signs of endorgan damage due to sepsis.  I also added ammonia due to confusion as well as acetaminophen and salicylate levels due to possible accidental medication overdose.    Imaging: Ordered chest x-ray.  Medications/Fluids: Ordered: Given lung findings and reported cough, concern for pneumonia, IV Rocephin and azithromycin ordered.  Will also give fluid bolus.  Most recent vital signs reviewed and are as follows: BP 131/88   Pulse (!) 117   Resp 16   SpO2 91%   Initial impression: Sepsis, altered mental status, possible medication overdose  5:45 PM Reassessment performed. Patient appears stable at this time.  Respiratory status is stable.  Oxygen saturation in low 90s on room air.  Mental status appears stable.  Patient has had an episode of vomiting.  Zofran ordered.  Labs personally reviewed and interpreted including: Mostly pending however lactate is 1.6 and ammonia is normal at 27.  Imaging personally visualized and interpreted  including: Chest x-ray  agree left basilar pneumonia consistent with exam.  Reviewed pertinent lab work and imaging with patient at bedside. Questions answered.   Most current vital signs reviewed and are as follows: BP 131/88   Pulse (!) 117   Resp 16   SpO2 91%   Plan: Awaiting additional results.  Patient will need admission to the hospital.  Antibiotics are ongoing.  7:32 PM consulted by telephone with Dr. Johny Drilling of [facility] hospitalist who will see patient for admission.  Requested CT chest, this was ordered.                             Medical Decision Making Amount and/or Complexity of Data Reviewed Labs: ordered. Radiology: ordered. ECG/medicine tests: ordered.  Risk Prescription drug management.          Final Clinical Impression(s) / ED Diagnoses Final diagnoses:  Sepsis with encephalopathy without septic shock, due to unspecified organism    Rx / DC Orders ED Discharge Orders     None         Renne Crigler, Cordelia Poche 11/19/22 1933    Wynetta Fines, MD 11/19/22 [phone]

## 2022-11-19 NOTE — Assessment & Plan Note (Signed)
Stable on flomax 0.4 mg

## 2022-11-19 NOTE — Assessment & Plan Note (Signed)
On tradjenta 5 mg daily at home.

## 2022-11-19 NOTE — Consult Note (Signed)
Hospitalist Consultation History and Physical    Justin Patel ZOX:[id number] DOB: 02-17-1951 DOA: 11/19/2022  DOS: the patient was seen and examined on 11/19/2022  PCP: [name], MD   Patient coming from: Home  I have personally briefly reviewed patient's old medical records in [facility]  CC: fever, cough, weakness HPI: 72 year old male history of diabetes, hypertension, chronic pain syndrome, chronic back pain, BPH presents to the ER today with a 2-week history of intermittent cough.  Is been productive for the last 2 to 3 days with green sputum.  Has felt feverish for 2 to 3 days but has not taken his temperature up until today.  Patient having shaking chills today.  Temperature recorded by the wife of 103.1.  Patient was feeling very weak.  EMS was called due to the wife being unable to take him to the hospital.  She states the patient was somewhat confused today and may have taken all of his home medications all at once including 4 doses of hydrocodone.  On arrival temp 100.4 heart rate 117 blood pressure 131/81 satting 91% on room air.  Labs showed normal urinalysis except for 100 protein  Sodium 139, potassium 3.5, BUN of 23, creatinine 1.25, glucose of 170  White count 10.8, hemoglobin 12.9, platelets of 100  Lactic acid normal 1.6,   COVID-negative  Ammonia normal at 27  Chest x-ray showed basilar opacity left lung base.  [facility] consulted.    Patient denies any diarrhea, abdominal pain.  Patient does drink a fair amount of alcohol towards the end the month as he frequently overuses his hydrocodone due to pain.  He states that he has talked to his neurosurgeon about this and that he wants to continue with his course instead of changing pain medications.  It appears that he has been referred to pain management.  Patient states that he has 3 glasses of wine a day towards the end of the month when he runs out of his hydrocodone.  He has never  had any problems with alcohol withdrawal.   ED Course: CXR shows small left basilar infiltrate. WBC 10.8  Review of Systems:  Review of Systems  Constitutional:  Positive for chills and fever.       Feverish for 3 days. Did not take temperature until today.  HENT: Negative.    Eyes: Negative.   Respiratory:  Positive for cough.        Occ green colored sputum  Cardiovascular: Negative.   Gastrointestinal: Negative.   Genitourinary: Negative.   Musculoskeletal:  Positive for back pain.       Chronic back pain  Skin: Negative.   Neurological: Negative.   Endo/Heme/Allergies: Negative.   Psychiatric/Behavioral: Negative.    All other systems reviewed and are negative.   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  removal scheduled to clear margins.   CHF (congestive heart failure)    no longer after weight loss   Chronic kidney disease    cysts   Degenerative disc disease    Depression    Diabetes mellitus    no longer diabetic-or on meds   DJD (degenerative joint disease)    Dysplastic nevus 02/04/2016   R upper back paraspinal - severe   Family history of adverse reaction to anesthesia  sons wake up combative   Foot fracture, left 01/2022   Gastric ulcer    GERD (gastroesophageal reflux disease)    H/O hiatal hernia    Headache(784.0)    sinus   Heart murmur    History of fusion of thoracic spine 12/28/2021   History of gastric bypass    Hx of congestive heart failure    Hyperlipidemia    Hypertension    Iron deficiency anemia 02/28/2015   Lumbar scoliosis    Opiate abuse, continuous 06/20/2015   S/P laparoscopic cholecystectomy 02/18/2020   Sacral fracture, closed    Seizures    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, after GI bleed   Sleep apnea    hx not now since wt loss   Stones in the urinary tract     Syncope and collapse     Past Surgical History:  Procedure Laterality Date   ANTERIOR CERVICAL DECOMP/DISCECTOMY FUSION  01/26/2012   Procedure: ANTERIOR CERVICAL DECOMPRESSION/DISCECTOMY FUSION 3 LEVELS;  Surgeon: [name], MD;  Location: [facility];  Service: Neurosurgery;  Laterality: N/A;  Cervical three-four Cervical four-five Cervical five-six Cervical six-seven , Anterior cervical decompression/diskectomy, fusion, plate   ANTERIOR LAT LUMBAR FUSION Right 04/25/2018   Procedure: Right Lumbar two-three Lumbar three-four Lumbar four-five anterior  lateral interbody fusion  with posterior percutaneous pedicle screws;  Surgeon: [name], MD;  Location: [facility] OR;  Service: Neurosurgery;  Laterality: Right;   APPENDECTOMY     CARDIAC CATHETERIZATION     [facility], normal   CARDIAC CATHETERIZATION     [place]   CHOLECYSTECTOMY     COLONOSCOPY WITH PROPOFOL N/A 12/27/2017   Procedure: COLONOSCOPY WITH PROPOFOL;  Surgeon: Toledo, [name], MD;  Location: [facility] END[facility]COPY;  Service: Gastroenterology;  Laterality: N/A;   ESOPHAGOGASTRODUODENOSCOPY (EGD) WITH PROPOFOL N/A 12/27/2017   Procedure: ESOPHAGOGASTRODUODENOSCOPY (EGD) WITH PROPOFOL;  Surgeon: Toledo, [name], MD;  Location: [facility] ENDOSCOPY;  Service: Gastroenterology;  Laterality: N/A;   ESOPHAGOGASTRODUODENOSCOPY (EGD) WITH PROPOFOL N/A 06/19/2021   Procedure: ESOPHAGOGASTRODUODENOSCOPY (EGD) WITH PROPOFOL;  Surgeon: [name], MD;  Location: [facility] ENDOSCOPY;  Service: Gastroenterology;  Laterality: N/A;   ESOPHAGOGASTRODUODENOSCOPY (EGD) WITH PROPOFOL N/A 09/25/2021   Procedure: ESOPHAGOGASTRODUODENOSCOPY (EGD) WITH PROPOFOL;  Surgeon: [name], MD;  Location: [facility] ENDOSCOPY;  Service: Endoscopy;  Laterality: N/A;   GASTRIC BYPASS  08/09/2008   [employer]   HARDWARE REMOVAL N/A 03/08/2022   Procedure: Removal of bilateral Thoracic ten pedicle screws;  Surgeon: [name], MD;  Location:  [facility] OR;  Service: Neurosurgery;  Laterality: N/A;   HERNIA REPAIR  08/09/1998   hiatal   JOINT REPLACEMENT     KNEE ARTHROSCOPY     bilateral, left x 2   LAMINECTOMY WITH POSTERIOR LATERAL ARTHRODESIS LEVEL 4 N/A 01/14/2020   Procedure: Decompressive Laminectomy Lumbar One with pedicle screw fixation from Thoracic Ten to Lumbar Two;  Surgeon: [name], MD;  Location: [facility] OR;  Service: Neurosurgery;  Laterality: N/A;  Decompressive Laminectomy Lumbar One with pedicle screw fixation from Thoracic Ten to Lumbar Two   LUMBAR PERCUTANEOUS PEDICLE SCREW 3 LEVEL N/A 04/25/2018   Procedure: LUMBAR PERCUTANEOUS PEDICLE SCREW 3 LEVEL;  Surgeon: [name], MD;  Location: [facility] OR;  Service: Neurosurgery;  Laterality: N/A;   NASAL SINUS SURGERY     x5   OSTEOTOMY  08/10/1999   left   ROUX-EN-Y GASTRIC BYPASS     THORACIC LAMINECTOMY FOR EPIDURAL ABSCESS N/A 03/28/2022   Procedure: THORACIC WOUND WASHOUT;  Surgeon: [name], MD;  Location: [facility] OR;  Service: Neurosurgery;  Laterality: N/A;   TONSILLECTOMY     TOTAL KNEE ARTHROPLASTY Left 05/09/2014   Dr. [name]   UVULOPALATOPLASTY  08/09/2009     reports that he has never smoked. He has never used smokeless tobacco. He reports current alcohol use of about 21.0 standard drinks of alcohol per week. He reports that he does not use drugs.  Allergies  Allergen Reactions   Altace [Ramipril] Anaphylaxis   Ace Inhibitors Hives   Levaquin [Levofloxacin In D5w] Hives   Lyrica [Pregabalin] Other (See Comments)    Edema   Metformin Diarrhea    High doses cause diarrhea.    Trulicity [Dulaglutide] Nausea And Vomiting    Family History  Problem Relation Age of Onset   Dementia Mother 52   Osteoporosis Mother    Heart disease Father 40   Diabetes Father    Lymphoma Sister        lymphoma, stage 4   Ovarian cancer Sister 38       Ovarian   Lupus Sister    Prostate cancer Maternal Uncle    Skin cancer Maternal Uncle    Tongue cancer  Maternal Uncle        uncle died of heart attack   Alcoholism Maternal Uncle    Lung cancer Paternal Aunt        pat aunts x 4 died of lung cancer   Lung cancer Paternal Aunt    Lung cancer Paternal Aunt    Lung cancer Paternal Aunt    Mesothelioma Cousin        paternal cousin    Prior to Admission medications   Medication Sig Start Date End Date Taking? Authorizing Provider  acetaminophen (TYLENOL) 325 MG tablet Take 650 mg by mouth every 6 (six) hours as needed for moderate pain.    [provider]  blood glucose meter kit and supplies KIT Dispense based on patient and insurance preference. Check once daily. ICD doing E11.9. 06/30/16   [name], MD  Calcium Carbonate-Vitamin D (CALCIUM 600/VITAMIN D PO) Take 1 tablet by mouth daily.    [provider]  Cholecalciferol 50 MCG (2000 UT) TABS Take by mouth. 01/31/20   [provider]  cyclobenzaprine (FLEXERIL) 5 MG tablet TAKE 1 TABLET BY MOUTH EVERY 8 HOURS AS NEEDED FOR SPASMS. 11/10/22   [name], MD  esomeprazole (NEXIUM) 40 MG capsule TAKE 1 CAPSULE BY MOUTH ONCE DAILY 06/20/20   [name], MD  ezetimibe (ZETIA) 10 MG tablet Take 1 tablet (10 mg total) by mouth daily. 10/12/22   [name], MD  furosemide (LASIX) 20 MG tablet TAKE 1 TABLET BY MOUTH DAILY 09/06/22   [name], MD  gabapentin (NEURONTIN) 300 MG capsule Take 1 capsule (300 mg total) by mouth in the morning AND 2 capsules (600 mg total) daily with lunch AND 1 capsule (300 mg total) at bedtime. 08/11/22   [name], MD  HYDROcodone-acetaminophen (NORCO) 7.5-325 MG tablet Take 1 tablet by mouth every 6 (six) hours as needed. 09/22/22   [provider]  ketoconazole (NIZORAL) 2 % cream Apply 1 Application topically daily. 04/23/22   [name], MD  metoprolol succinate (TOPROL-XL) 50 MG 24 hr tablet TAKE 1 TABLET BY MOUTH DAILY WITH OR IMMEDIATLY FOLLOWING A MEAL 06/13/22   [name], MD  Multiple Vitamin (MULTIVITAMIN) capsule Take 1 capsule by mouth daily.  With copper and zinc 07/11/18   [name], MD  nystatin-triamcinolone ointment [facility]) Apply 1 application topically 2 (two) times daily. Patient taking differently: Apply 1 application  topically daily as needed (irritation). 05/08/18   [name], MD  oxyCODONE-acetaminophen (PERCOCET) 7.5-325 MG tablet Take 1 tablet by mouth 2 (two) times daily as needed. Patient not taking: Reported on 11/10/2022    [provider]  primidone (MYSOLINE) 50 MG tablet Take 2 tablets (100 mg total) by mouth at bedtime. 10/20/22   Drema Dallas, DO  rosuvastatin (CRESTOR) 40 MG tablet TAKE ONE TABLET EVERY DAY 11/01/22   Iran Ouch, MD  sucralfate (CARAFATE) 1 g tablet TAKE 1 TABLET BY MOUTH 4 TIMES DAILY 09/06/22   [name], MD  tamsulosin (FLOMAX) 0.4 MG CAPS capsule Take 2 capsules (0.8 mg total) by mouth daily. 11/10/22   [name], MD  TRADJENTA 5 MG TABS tablet TAKE 1 TABLET BY MOUTH DAILY 06/15/22   [name], MD  venlafaxine XR (EFFEXOR-XR) 75 MG 24 hr capsule TAKE ONE CAPSULE EVERY MORNING WITH BREAKFAST 09/06/22   [name], MD    Physical Exam: Vitals:   11/19/22 1815 11/19/22 1830 11/19/22 1845 11/19/22 1900  BP: 138/80 (!) 142/79 128/74   Pulse: 99 (!) 101 99 98  Resp: 18 17 17 16   Temp:      TempSrc:      SpO2: 90% 97%  94%    Physical Exam Vitals and nursing note reviewed.  Constitutional:      General: He is not in acute distress.    Appearance: He is not toxic-appearing.  HENT:     Head: Normocephalic and atraumatic.  Cardiovascular:     Rate and Rhythm: Normal rate and regular rhythm.     Pulses: Normal pulses.     Comments: HR 95 on telemetry Pulmonary:     Effort: Pulmonary effort is normal.     Comments: Coarse BS bilateral bases Abdominal:     General: Bowel sounds are normal. There is no distension.     Palpations: Abdomen is  soft.     Tenderness: There is no abdominal tenderness.  Musculoskeletal:     Right lower leg: No edema.     Left lower leg: No edema.  Skin:    General: Skin is warm and dry.     Capillary Refill: Capillary refill takes less than 2 seconds.  Neurological:     General: No focal deficit present.     Mental Status: He is alert and oriented to person, place, and time.      Labs on Admission: I have personally reviewed following labs and imaging studies  CBC: Recent Labs  Lab 11/19/22 1435  WBC 10.8*  NEUTROABS 10.1*  HGB 12.9*  HCT 40.8  MCV 108.8*  PLT 100*   Basic Metabolic Panel: Recent Labs  Lab 11/19/22 1435  NA 139  K 3.5  CL 108  CO2 23  GLUCOSE 170*  BUN 23  CREATININE 1.25*  CALCIUM 8.0*   GFR: Estimated Creatinine Clearance: 53.1 mL/min (A) (by C-G formula based on SCr of 1.25 mg/dL (H)). Liver Function Tests: Recent Labs  Lab 11/19/22 1435  AST 52*  ALT 60*  ALKPHOS 50  BILITOT 0.7  PROT 5.8*  ALBUMIN 2.9*   No results for input(s): "LIPASE", "AMYLASE" in the last 168 hours. Recent Labs  Lab 11/19/22 1635  AMMONIA 27   Coagulation Profile: Recent Labs  Lab 11/19/22 1435  INR 1.1   Urine analysis:    Component Value Date/Time   COLORURINE YELLOW 11/19/2022 1846   APPEARANCEUR CLEAR 11/19/2022 1846   APPEARANCEUR Clear 05/01/2014 0928   LABSPEC 1.018 11/19/2022 1846   LABSPEC 1.031 05/01/2014 0928   PHURINE 5.0 11/19/2022 1846   GLUCOSEU NEGATIVE 11/19/2022 1846   GLUCOSEU Negative 05/01/2014 0928   HGBUR NEGATIVE 11/19/2022 1846   BILIRUBINUR NEGATIVE 11/19/2022 1846   BILIRUBINUR neg 01/11/2022 1147   BILIRUBINUR Negative 05/01/2014 0928   KETONESUR NEGATIVE 11/19/2022 1846   PROTEINUR 100 (A) 11/19/2022 1846   UROBILINOGEN 0.2 01/11/2022 1147   NITRITE NEGATIVE 11/19/2022 1846   LEUKOCYTESUR NEGATIVE 11/19/2022 1846   LEUKOCYTESUR Negative 05/01/2014 0928    Radiological Exams on Admission: I have personally reviewed  images DG Chest 2 View  Result Date: 11/19/2022 CLINICAL DATA:  Questionable sepsis, weakness, possible overdose EXAM: CHEST - 2 VIEW COMPARISON:  09/01/2022 FINDINGS: Cardiomegaly. Heterogeneous airspace opacity of the left lung base. Mild diffuse interstitial opacity. Disc degenerative disease of the thoracic spine. IMPRESSION: 1. Heterogeneous airspace opacity of the left lung base, concerning for infection or aspiration. 2. Mild diffuse interstitial opacity, likely edema. 3. Cardiomegaly. 4. Disc degenerative disease of the thoracic spine. Electronically Signed   By: Jearld Lesch M.D.   On: 11/19/2022 16:56    EKG: My personal interpretation of EKG shows: sinus tachycardia    Assessment/Plan Principal Problem:   LLL pneumonia Active Problems:   Type 2 diabetes mellitus   Essential hypertension, benign   Alcohol abuse   BPH (benign prostatic hyperplasia)   Chronic pain syndrome   CKD stage 3a, GFR 45-59 ml/min   Chronic back pain   Tremor   Hyperlipidemia    Assessment and Plan: * LLL pneumonia WBC 10.8. only small basilar infiltrate on Cxr. Already received IV Rocephin/Zithromax in ER. Checking CT chest without contrast to better define pneumonia. Could go either way. Admission for IV ABX vs discharge to home with PO abx and f/u with PCP. Discussed with pt and wife at bedside. Will await CT chest.  Tremor Follows with neurology. On primidone 100 mg qhs.  Chronic back pain Chronic. On neurontin and hydrocodone. Follows with neurosurgery.  CKD stage 3a, GFR 45-59 ml/min Follows with Dr. Thedore Mins with [facility]. Stable.  Chronic pain syndrome Due to concern about pt taking all of his pain meds at one time today, tylenol level being checked. Will give 2 mg po dilaudid x 1 due to back pain.  BPH (benign prostatic hyperplasia) Stable on flomax 0.4 mg  Alcohol abuse Pt drinks 3 glasses of wine per day towards the end of the month when he has run out of hydrocodone  to take. Pt does not want to change his opiate pain regimen. He has been referred pt pain management by his neurosurgeon.  Essential hypertension, benign Stable on toprol-xl 50 mg daily  Type 2 diabetes mellitus On tradjenta 5 mg daily at home.  Hyperlipidemia Stable. On zetia and crestor.   Code Status: Full Code Family Communication: discussed with pt and wife beverely at bedside  Disposition Plan: undetermined at this point  Consults called: none  Admission status:  awaiting CT chest to determine if pt requires hospital admission ,      Carollee Herter, DO [facility] 11/19/2022, 8:18 PM

## 2022-11-19 NOTE — ED Triage Notes (Signed)
Patient BIB EMS from home  for AMS and weakness. Patient took all pain medications at once that was for today and tomorrow unaware of how medication that was taken this morning.  Temp. 103.1, Dx. UTI 11/10/2022 was not given ABT. Denies pain. C/o weakness. 18 left AC.  500mg  IVF, Ace 650mg . 160/100, 91% 2liters, CBG 157.

## 2022-11-20 DIAGNOSIS — K2100 Gastro-esophageal reflux disease with esophagitis, without bleeding: Secondary | ICD-10-CM | POA: Diagnosis present

## 2022-11-20 DIAGNOSIS — J189 Pneumonia, unspecified organism: Secondary | ICD-10-CM

## 2022-11-20 DIAGNOSIS — E876 Hypokalemia: Secondary | ICD-10-CM | POA: Diagnosis not present

## 2022-11-20 DIAGNOSIS — M545 Low back pain, unspecified: Secondary | ICD-10-CM | POA: Diagnosis not present

## 2022-11-20 DIAGNOSIS — I2510 Atherosclerotic heart disease of native coronary artery without angina pectoris: Secondary | ICD-10-CM | POA: Diagnosis present

## 2022-11-20 DIAGNOSIS — Z1152 Encounter for screening for COVID-19: Secondary | ICD-10-CM | POA: Diagnosis not present

## 2022-11-20 DIAGNOSIS — Z7984 Long term (current) use of oral hypoglycemic drugs: Secondary | ICD-10-CM | POA: Diagnosis not present

## 2022-11-20 DIAGNOSIS — G894 Chronic pain syndrome: Secondary | ICD-10-CM | POA: Diagnosis present

## 2022-11-20 DIAGNOSIS — N400 Enlarged prostate without lower urinary tract symptoms: Secondary | ICD-10-CM | POA: Diagnosis present

## 2022-11-20 DIAGNOSIS — Z8679 Personal history of other diseases of the circulatory system: Secondary | ICD-10-CM | POA: Diagnosis not present

## 2022-11-20 DIAGNOSIS — Z87892 Personal history of anaphylaxis: Secondary | ICD-10-CM | POA: Diagnosis not present

## 2022-11-20 DIAGNOSIS — F1010 Alcohol abuse, uncomplicated: Secondary | ICD-10-CM

## 2022-11-20 DIAGNOSIS — E119 Type 2 diabetes mellitus without complications: Secondary | ICD-10-CM

## 2022-11-20 DIAGNOSIS — Z96652 Presence of left artificial knee joint: Secondary | ICD-10-CM | POA: Diagnosis present

## 2022-11-20 DIAGNOSIS — Z9884 Bariatric surgery status: Secondary | ICD-10-CM | POA: Diagnosis not present

## 2022-11-20 DIAGNOSIS — Z8249 Family history of ischemic heart disease and other diseases of the circulatory system: Secondary | ICD-10-CM | POA: Diagnosis not present

## 2022-11-20 DIAGNOSIS — Z888 Allergy status to other drugs, medicaments and biological substances status: Secondary | ICD-10-CM | POA: Diagnosis not present

## 2022-11-20 DIAGNOSIS — M419 Scoliosis, unspecified: Secondary | ICD-10-CM | POA: Diagnosis present

## 2022-11-20 DIAGNOSIS — I130 Hypertensive heart and chronic kidney disease with heart failure and stage 1 through stage 4 chronic kidney disease, or unspecified chronic kidney disease: Secondary | ICD-10-CM | POA: Diagnosis present

## 2022-11-20 DIAGNOSIS — E1122 Type 2 diabetes mellitus with diabetic chronic kidney disease: Secondary | ICD-10-CM | POA: Diagnosis present

## 2022-11-20 DIAGNOSIS — J180 Bronchopneumonia, unspecified organism: Secondary | ICD-10-CM | POA: Diagnosis present

## 2022-11-20 DIAGNOSIS — Z801 Family history of malignant neoplasm of trachea, bronchus and lung: Secondary | ICD-10-CM | POA: Diagnosis not present

## 2022-11-20 DIAGNOSIS — N1831 Chronic kidney disease, stage 3a: Secondary | ICD-10-CM | POA: Diagnosis present

## 2022-11-20 DIAGNOSIS — E785 Hyperlipidemia, unspecified: Secondary | ICD-10-CM | POA: Diagnosis present

## 2022-11-20 DIAGNOSIS — Z981 Arthrodesis status: Secondary | ICD-10-CM | POA: Diagnosis not present

## 2022-11-20 DIAGNOSIS — Z79899 Other long term (current) drug therapy: Secondary | ICD-10-CM | POA: Diagnosis not present

## 2022-11-20 DIAGNOSIS — K5730 Diverticulosis of large intestine without perforation or abscess without bleeding: Secondary | ICD-10-CM | POA: Diagnosis present

## 2022-11-20 HISTORY — DX: Pneumonia, unspecified organism: J18.9

## 2022-11-20 LAB — COMPREHENSIVE METABOLIC PANEL
ALT: 45 U/L — ABNORMAL HIGH (ref 0–44)
AST: 33 U/L (ref 15–41)
Albumin: 2.5 g/dL — ABNORMAL LOW (ref 3.5–5.0)
Alkaline Phosphatase: 34 U/L — ABNORMAL LOW (ref 38–126)
Anion gap: 11 (ref 5–15)
BUN: 22 mg/dL (ref 8–23)
CO2: 24 mmol/L (ref 22–32)
Calcium: 8.2 mg/dL — ABNORMAL LOW (ref 8.9–10.3)
Chloride: 106 mmol/L (ref 98–111)
Creatinine, Ser: 1.16 mg/dL (ref 0.61–1.24)
GFR, Estimated: >60 mL/min (ref >60)
Glucose, Bld: 159 mg/dL — ABNORMAL HIGH (ref 70–99)
Potassium: 3.4 mmol/L — ABNORMAL LOW (ref 3.5–5.1)
Sodium: 141 mmol/L (ref 135–145)
Total Bilirubin: 0.5 mg/dL (ref 0.3–1.2)
Total Protein: 5.3 g/dL — ABNORMAL LOW (ref 6.5–8.1)

## 2022-11-20 LAB — CBC WITH DIFFERENTIAL/PLATELET
Abs Immature Granulocytes: 0.09 10*3/uL — ABNORMAL HIGH (ref 0.00–0.07)
Basophils Absolute: 0 10*3/uL (ref 0.0–0.1)
Basophils Relative: 0 %
Eosinophils Absolute: 0 10*3/uL (ref 0.0–0.5)
Eosinophils Relative: 0 %
HCT: 35.1 % — ABNORMAL LOW (ref 39.0–52.0)
Hemoglobin: 11.9 g/dL — ABNORMAL LOW (ref 13.0–17.0)
Immature Granulocytes: 1 %
Lymphocytes Relative: 3 %
Lymphs Abs: 0.5 10*3/uL — ABNORMAL LOW (ref 0.7–4.0)
MCH: 36.1 pg — ABNORMAL HIGH (ref 26.0–34.0)
MCHC: 33.9 g/dL (ref 30.0–36.0)
MCV: 106.4 fL — ABNORMAL HIGH (ref 80.0–100.0)
Monocytes Absolute: 0.8 10*3/uL (ref 0.1–1.0)
Monocytes Relative: 5 %
Neutro Abs: 14.5 10*3/uL — ABNORMAL HIGH (ref 1.7–7.7)
Neutrophils Relative %: 91 %
Platelets: 99 10*3/uL — ABNORMAL LOW (ref 150–400)
RBC: 3.3 MIL/uL — ABNORMAL LOW (ref 4.22–5.81)
RDW: 13.8 % (ref 11.5–15.5)
WBC: 15.9 10*3/uL — ABNORMAL HIGH (ref 4.0–10.5)
nRBC: 0 % (ref 0.0–0.2)

## 2022-11-20 LAB — GLUCOSE, CAPILLARY
Glucose-Capillary: 156 mg/dL — ABNORMAL HIGH (ref 70–99)
Glucose-Capillary: 118 mg/dL — ABNORMAL HIGH (ref 70–99)
Glucose-Capillary: 146 mg/dL — ABNORMAL HIGH (ref 70–99)
Glucose-Capillary: 235 mg/dL — ABNORMAL HIGH (ref 70–99)
Glucose-Capillary: 78 mg/dL (ref 70–99)

## 2022-11-20 LAB — CULTURE, BLOOD (ROUTINE X 2): Culture: NO GROWTH

## 2022-11-20 LAB — PROCALCITONIN: Procalcitonin: 4.39 ng/mL

## 2022-11-20 LAB — STREP PNEUMONIAE URINARY ANTIGEN: Strep Pneumo Urinary Antigen: NEGATIVE

## 2022-11-20 LAB — MAGNESIUM: Magnesium: 1.7 mg/dL (ref 1.7–2.4)

## 2022-11-20 LAB — LACTIC ACID, PLASMA: Lactic Acid, Venous: 1.1 mmol/L (ref 0.5–1.9)

## 2022-11-20 MED ORDER — POTASSIUM CHLORIDE CRYS ER 20 MEQ PO TBCR
40.0000 meq | EXTENDED_RELEASE_TABLET | Freq: Once | ORAL | Status: AC
  Administered 2022-11-20: 40 meq via ORAL
  Filled 2022-11-20: qty 2

## 2022-11-20 NOTE — Progress Notes (Signed)
[facility]  PROGRESS NOTE  Justin Patel QMV:[id number] DOB: Jul 04, 1951 DOA: 11/19/2022 PCP: [name], MD   Brief HPI:   72 year old male with history of diabetes mellitus type 2, hypertension, chronic pain syndrome, chronic back pain, BPH came to ED with 2-week history of intermittent cough.  Also was feeling feverish at home.  In the ED COVID-negative, chest x-ray showed basilar opacity at left lung base.  CT chest showed esophagitis, finding compatible with left-sided predominant  bronchopneumonia    Subjective   Patient seen and examined, breathing has improved.   Assessment/Plan:    Left lower lobe bronchopneumonia -Presented with fever, cough, bronchopneumonia seen on CT chest -Started on ceftriaxone and Zithromax -Continues to have fever, temperature 101 degree F this morning -WBC still elevated 15.9, check procalcitonin  Tremors -Followed by neurology, continue primidone 100 mg p.o. nightly  Chronic back pain -Continue Neurontin, hydrocodone -Follow neurosurgery as outpatient  CKD stage III a -Creatinine 1.16, stable  BPH -Continue Flomax 0.4 mg daily  Alcohol abuse Patient stated he drinks 3 glasses of wine at the end of the month when he runs out of hydrocodone  Diabetes mellitus type 2 -Continue sliding scale insulin NovoLog -CBG well-controlled  Hyperlipidemia -Continue Zetia, Crestor  Hypertension -Blood pressure is stable -Continue metoprolol  Hypokalemia -Potassium is 3.4 -Replace potassium and follow BMP in am  Medications     azithromycin  500 mg Oral Daily   ezetimibe  10 mg Oral Daily   furosemide  20 mg Oral Daily   gabapentin  300 mg Oral TID   heparin  5,000 Units Subcutaneous Q8H   insulin aspart  0-5 Units Subcutaneous QHS   insulin aspart  0-9 Units Subcutaneous TID WC   linagliptin  5 mg Oral Daily   metoprolol succinate  50 mg Oral Daily   pantoprazole  40 mg Oral Daily   primidone  100 mg Oral QHS    rosuvastatin  40 mg Oral Daily   tamsulosin  0.8 mg Oral Daily   venlafaxine XR  75 mg Oral Q breakfast     Data Reviewed:   CBG:  Recent Labs  Lab 11/20/22 0130 11/20/22 0805 11/20/22 1147  GLUCAP 156* 118* 146*    SpO2: 94 %    Vitals:   11/19/22 2323 11/19/22 2356 11/20/22 0444 11/20/22 0812  BP: (!) 171/87  (!) 155/70 133/76  Pulse: 97  63 78  Resp: 19  19 17   Temp: (!) 101.3 F (38.5 C)  99.3 F (37.4 C) (!) 101 F (38.3 C)  TempSrc: Oral  Oral Oral  SpO2: 98%  93% 94%  Weight:  85.1 kg    Height:  5\' 5"  (1.651 m)        Data Reviewed:  Basic Metabolic Panel: Recent Labs  Lab 11/19/22 1435 11/20/22 0230  NA 139 141  K 3.5 3.4*  CL 108 106  CO2 23 24  GLUCOSE 170* 159*  BUN 23 22  CREATININE 1.25* 1.16  CALCIUM 8.0* 8.2*  MG  --  1.7    CBC: Recent Labs  Lab 11/19/22 1435 11/20/22 0230  WBC 10.8* 15.9*  NEUTROABS 10.1* 14.5*  HGB 12.9* 11.9*  HCT 40.8 35.1*  MCV 108.8* 106.4*  PLT 100* 99*    LFT Recent Labs  Lab 11/19/22 1435 11/20/22 0230  AST 52* 33  ALT 60* 45*  ALKPHOS 50 34*  BILITOT 0.7 0.5  PROT 5.8* 5.3*  ALBUMIN 2.9* 2.5*  Antibiotics: Anti-infectives (From admission, onward)    Start     Dose/Rate Route Frequency Ordered Stop   11/20/22 1400  azithromycin (ZITHROMAX) tablet 500 mg        500 mg Oral Daily 11/19/22 2351 11/25/22 0959   11/20/22 0000  cefTRIAXone (ROCEPHIN) 2 g in sodium chloride 0.9 % 100 mL IVPB        2 g 200 mL/hr over 30 Minutes Intravenous Daily at 10 pm 11/19/22 2351 11/24/22 2159   11/19/22 1645  cefTRIAXone (ROCEPHIN) 1 g in sodium chloride 0.9 % 100 mL IVPB        1 g 200 mL/hr over 30 Minutes Intravenous  Once 11/19/22 1631 11/19/22 1731   11/19/22 1645  azithromycin (ZITHROMAX) 500 mg in sodium chloride 0.9 % 250 mL IVPB        500 mg 250 mL/hr over 60 Minutes Intravenous  Once 11/19/22 1631 11/19/22 1828        DVT prophylaxis: Heparin  Code Status: Full code  Family  Communication: No family at bedside   CONSULTS    Objective    Physical Examination:   General-appears in no acute distress Heart-S1-S2, regular, no murmur auscultated Lungs-clear to auscultation bilaterally, no wheezing or crackles auscultated Abdomen-soft, nontender, no organomegaly Extremities-no edema in the lower extremities Neuro-alert, oriented x3, no focal deficit noted  Status is: Inpatient:             [name]   [facility] If 7PM-7AM, please contact night-coverage at [email or web address], Office  [phone]   11/20/2022, 12:58 PM  LOS: 0 days

## 2022-11-21 DIAGNOSIS — F1010 Alcohol abuse, uncomplicated: Secondary | ICD-10-CM | POA: Diagnosis not present

## 2022-11-21 DIAGNOSIS — M545 Low back pain, unspecified: Secondary | ICD-10-CM

## 2022-11-21 DIAGNOSIS — J189 Pneumonia, unspecified organism: Secondary | ICD-10-CM | POA: Diagnosis not present

## 2022-11-21 DIAGNOSIS — R251 Tremor, unspecified: Secondary | ICD-10-CM

## 2022-11-21 DIAGNOSIS — N400 Enlarged prostate without lower urinary tract symptoms: Secondary | ICD-10-CM | POA: Diagnosis not present

## 2022-11-21 DIAGNOSIS — G8929 Other chronic pain: Secondary | ICD-10-CM

## 2022-11-21 LAB — CBC
HCT: 32.1 % — ABNORMAL LOW (ref 39.0–52.0)
Hemoglobin: 10.7 g/dL — ABNORMAL LOW (ref 13.0–17.0)
MCH: 35.4 pg — ABNORMAL HIGH (ref 26.0–34.0)
MCHC: 33.3 g/dL (ref 30.0–36.0)
MCV: 106.3 fL — ABNORMAL HIGH (ref 80.0–100.0)
Platelets: 91 10*3/uL — ABNORMAL LOW (ref 150–400)
RBC: 3.02 MIL/uL — ABNORMAL LOW (ref 4.22–5.81)
RDW: 13.9 % (ref 11.5–15.5)
WBC: 12.6 10*3/uL — ABNORMAL HIGH (ref 4.0–10.5)
nRBC: 0 % (ref 0.0–0.2)

## 2022-11-21 LAB — COMPREHENSIVE METABOLIC PANEL
ALT: 33 U/L (ref 0–44)
AST: 22 U/L (ref 15–41)
Albumin: 2.3 g/dL — ABNORMAL LOW (ref 3.5–5.0)
Alkaline Phosphatase: 35 U/L — ABNORMAL LOW (ref 38–126)
Anion gap: 9 (ref 5–15)
BUN: 19 mg/dL (ref 8–23)
CO2: 23 mmol/L (ref 22–32)
Calcium: 7.9 mg/dL — ABNORMAL LOW (ref 8.9–10.3)
Chloride: 103 mmol/L (ref 98–111)
Creatinine, Ser: 1.08 mg/dL (ref 0.61–1.24)
GFR, Estimated: >60 mL/min (ref >60)
Glucose, Bld: 191 mg/dL — ABNORMAL HIGH (ref 70–99)
Potassium: 3.5 mmol/L (ref 3.5–5.1)
Sodium: 135 mmol/L (ref 135–145)
Total Bilirubin: 0.3 mg/dL (ref 0.3–1.2)
Total Protein: 5.1 g/dL — ABNORMAL LOW (ref 6.5–8.1)

## 2022-11-21 LAB — GLUCOSE, CAPILLARY
Glucose-Capillary: 126 mg/dL — ABNORMAL HIGH (ref 70–99)
Glucose-Capillary: 132 mg/dL — ABNORMAL HIGH (ref 70–99)
Glucose-Capillary: 187 mg/dL — ABNORMAL HIGH (ref 70–99)
Glucose-Capillary: 105 mg/dL — ABNORMAL HIGH (ref 70–99)

## 2022-11-21 LAB — CULTURE, BLOOD (ROUTINE X 2)

## 2022-11-21 MED ORDER — PANTOPRAZOLE SODIUM 40 MG PO TBEC
40.0000 mg | DELAYED_RELEASE_TABLET | Freq: Two times a day (BID) | ORAL | Status: DC
  Administered 2022-11-21 – 2022-11-23 (×4): 40 mg via ORAL
  Filled 2022-11-21 (×4): qty 1

## 2022-11-21 MED ORDER — AMLODIPINE BESYLATE 5 MG PO TABS
5.0000 mg | ORAL_TABLET | Freq: Every day | ORAL | Status: DC
  Administered 2022-11-21 – 2022-11-23 (×3): 5 mg via ORAL
  Filled 2022-11-21 (×3): qty 1

## 2022-11-21 NOTE — Progress Notes (Signed)
[facility]  PROGRESS NOTE  Justin Patel:[id number] DOB: Dec 10, 1950 DOA: 11/19/2022 PCP: [name], MD   Brief HPI:   72 year old male with history of diabetes mellitus type 2, hypertension, chronic pain syndrome, chronic back pain, BPH came to ED with 2-week history of intermittent cough.  Also was feeling feverish at home.  In the ED COVID-negative, chest x-ray showed basilar opacity at left lung base.  CT chest showed esophagitis, finding compatible with left-sided predominant  bronchopneumonia    Subjective   Complains of left lower quadrant pain.  He does have history of diverticulosis.   Assessment/Plan:    Left lower lobe bronchopneumonia -Presented with fever, cough, bronchopneumonia seen on CT chest -Started on ceftriaxone and Zithromax -Procalcitonin 4.39, afebrile for past 24 hours -WBC is down to 12.6  Left lower quadrant pain -Has history of diverticulosis  -Offered CT abdomen/pelvis, patient refused to get CT today. -Wants to get CT in the morning   Tremors -Followed by neurology, continue primidone 100 mg p.o. nightly  Chronic back pain -Continue Neurontin, hydrocodone -Follow neurosurgery as outpatient  CKD stage III a -Creatinine 1.08  BPH -Continue Flomax 0.4 mg daily  Alcohol abuse Patient stated he drinks 3 glasses of wine at the end of the month when he runs out of hydrocodone  Diabetes mellitus type 2 -Continue sliding scale insulin NovoLog -CBG well-controlled  Hyperlipidemia -Continue Zetia, Crestor  Hypertension -Continue metoprolol -Blood pressure is still elevated, will start  amlodipine 5 mg daily  Hypokalemia -Replete  Medications     amLODipine  5 mg Oral Daily   azithromycin  500 mg Oral Daily   ezetimibe  10 mg Oral Daily   furosemide  20 mg Oral Daily   gabapentin  300 mg Oral TID   heparin  5,000 Units Subcutaneous Q8H   insulin aspart  0-5 Units Subcutaneous QHS   insulin aspart  0-9 Units  Subcutaneous TID WC   linagliptin  5 mg Oral Daily   metoprolol succinate  50 mg Oral Daily   pantoprazole  40 mg Oral Daily   primidone  100 mg Oral QHS   rosuvastatin  40 mg Oral Daily   tamsulosin  0.8 mg Oral Daily   venlafaxine XR  75 mg Oral Q breakfast     Data Reviewed:   CBG:  Recent Labs  Lab 11/20/22 0805 11/20/22 1147 11/20/22 1724 11/20/22 2131 11/21/22 0758  GLUCAP 118* 146* 235* 78 126*    SpO2: 95 %    Vitals:   11/21/22 0023 11/21/22 0536 11/21/22 0831 11/21/22 0846  BP: 137/78 (!) 154/97 (!) 160/88 (!) 178/91  Pulse: 80 67 60 61  Resp: Temp: 99.2 F (37.3 C) 98.6 F (37 C) 98.9 F (37.2 C) 98.6 F (37 C)  TempSrc: Oral Oral Oral Oral  SpO2: 96% 99% 95%   Weight:      Height:          Data Reviewed:  Basic Metabolic Panel: Recent Labs  Lab 11/19/22 1435 11/20/22 0230 11/21/22 0047  NA 139 141 135  K 3.5 3.4* 3.5  CL 108 106 103  CO2 GLUCOSE 170* 159* 191*  BUN CREATININE 1.25* 1.16 1.08  CALCIUM 8.0* 8.2* 7.9*  MG  --  1.7  --     CBC: Recent Labs  Lab 11/19/22 1435 11/20/22 0230 11/21/22 0047  WBC 10.8* 15.9* 12.6*  NEUTROABS 10.1* 14.5*  --  HGB 12.9* 11.9* 10.7*  HCT 40.8 35.1* 32.1*  MCV 108.8* 106.4* 106.3*  PLT 100* 99* 91*    LFT Recent Labs  Lab 11/19/22 1435 11/20/22 0230 11/21/22 0047  AST 52* 33 22  ALT 60* 45* 33  ALKPHOS 50 34* 35*  BILITOT 0.7 0.5 0.3  PROT 5.8* 5.3* 5.1*  ALBUMIN 2.9* 2.5* 2.3*     Antibiotics: Anti-infectives (From admission, onward)    Start     Dose/Rate Route Frequency Ordered Stop   11/20/22 1400  azithromycin (ZITHROMAX) tablet 500 mg        500 mg Oral Daily 11/19/22 2351 11/25/22 0959   11/20/22 0000  cefTRIAXone (ROCEPHIN) 2 g in sodium chloride 0.9 % 100 mL IVPB        2 g 200 mL/hr over 30 Minutes Intravenous Daily at 10 pm 11/19/22 2351 11/24/22 2159   11/19/22 1645  cefTRIAXone (ROCEPHIN) 1 g in sodium chloride 0.9 % 100 mL  IVPB        1 g 200 mL/hr over 30 Minutes Intravenous  Once 11/19/22 1631 11/19/22 1731   11/19/22 1645  azithromycin (ZITHROMAX) 500 mg in sodium chloride 0.9 % 250 mL IVPB        500 mg 250 mL/hr over 60 Minutes Intravenous  Once 11/19/22 1631 11/19/22 1828        DVT prophylaxis: Heparin  Code Status: Full code  Family Communication: No family at bedside   CONSULTS    Objective    Physical Examination:  General-appears in no acute distress Heart-S1-S2, regular, no murmur auscultated Lungs-clear to auscultation bilaterally, no wheezing or crackles auscultated Abdomen-soft, tenderness to palpation in left lower quadrant Extremities-no edema in the lower extremities Neuro-alert, oriented x3, no focal deficit noted   Status is: Inpatient:             [name]   [facility] If 7PM-7AM, please contact night-coverage at [email or web address], Office  [phone]   11/21/2022, 11:05 AM  LOS: 1 day

## 2022-11-22 ENCOUNTER — Inpatient Hospital Stay (HOSPITAL_COMMUNITY): Payer: Medicare PPO

## 2022-11-22 DIAGNOSIS — F1010 Alcohol abuse, uncomplicated: Secondary | ICD-10-CM | POA: Diagnosis not present

## 2022-11-22 DIAGNOSIS — N400 Enlarged prostate without lower urinary tract symptoms: Secondary | ICD-10-CM | POA: Diagnosis not present

## 2022-11-22 DIAGNOSIS — J189 Pneumonia, unspecified organism: Secondary | ICD-10-CM | POA: Diagnosis not present

## 2022-11-22 DIAGNOSIS — M545 Low back pain, unspecified: Secondary | ICD-10-CM | POA: Diagnosis not present

## 2022-11-22 LAB — CULTURE, BLOOD (ROUTINE X 2)
Special Requests: ADEQUATE
Special Requests: ADEQUATE

## 2022-11-22 LAB — CBC
HCT: 34.2 % — ABNORMAL LOW (ref 39.0–52.0)
Hemoglobin: 11 g/dL — ABNORMAL LOW (ref 13.0–17.0)
MCH: 34.5 pg — ABNORMAL HIGH (ref 26.0–34.0)
MCHC: 32.2 g/dL (ref 30.0–36.0)
MCV: 107.2 fL — ABNORMAL HIGH (ref 80.0–100.0)
Platelets: 114 10*3/uL — ABNORMAL LOW (ref 150–400)
RBC: 3.19 MIL/uL — ABNORMAL LOW (ref 4.22–5.81)
RDW: 13.5 % (ref 11.5–15.5)
WBC: 8.8 10*3/uL (ref 4.0–10.5)
nRBC: 0 % (ref 0.0–0.2)

## 2022-11-22 LAB — GLUCOSE, CAPILLARY
Glucose-Capillary: 92 mg/dL (ref 70–99)
Glucose-Capillary: 129 mg/dL — ABNORMAL HIGH (ref 70–99)
Glucose-Capillary: 129 mg/dL — ABNORMAL HIGH (ref 70–99)
Glucose-Capillary: 108 mg/dL — ABNORMAL HIGH (ref 70–99)

## 2022-11-22 LAB — LEGIONELLA PNEUMOPHILA SEROGP 1 UR AG: L. pneumophila Serogp 1 Ur Ag: NEGATIVE

## 2022-11-22 LAB — PROCALCITONIN: Procalcitonin: 1.77 ng/mL

## 2022-11-22 MED ORDER — IOHEXOL 9 MG/ML PO SOLN
500.0000 mL | ORAL | Status: AC
  Administered 2022-11-22 (×2): 500 mL via ORAL

## 2022-11-22 MED ORDER — IOHEXOL 350 MG/ML SOLN
75.0000 mL | Freq: Once | INTRAVENOUS | Status: AC | PRN
  Administered 2022-11-22: 75 mL via INTRAVENOUS

## 2022-11-22 NOTE — Progress Notes (Signed)
Pt transported to CT ?

## 2022-11-22 NOTE — Progress Notes (Signed)
(  2)Contrast given to patient to drink for CT

## 2022-11-22 NOTE — Progress Notes (Signed)
   11/22/22 1000  Mobility  Activity Ambulated with assistance in hallway  Level of Assistance Modified independent, requires aide device or extra time  Assistive Device None  Distance Ambulated (ft) 250 ft  Activity Response Tolerated well  Mobility Referral Yes  $Mobility charge 1 Mobility   Mobility Specialist Progress Note  Pre-Mobility: 92% SpO2 During Mobility: 90% SpO2 Post-Mobility: 91% SpO2  Pt was in chair and agreeable. Had no c/o pain. Left in chair w/ all needs met and call bell in reach.  Anastasia Pall Mobility Specialist  Please contact via SecureChat or Rehab office at [phone]

## 2022-11-22 NOTE — Progress Notes (Signed)
Wrote on pt white board that next dose of NORCO is due at 11am. Pt made aware

## 2022-11-22 NOTE — Progress Notes (Signed)
[facility]  PROGRESS NOTE  Justin Patel ZOX:[id number] DOB: 08-01-1951 DOA: 11/19/2022 PCP: [name], MD   Brief HPI:   72 year old male with history of diabetes mellitus type 2, hypertension, chronic pain syndrome, chronic back pain, BPH came to ED with 2-week history of intermittent cough.  Also was feeling feverish at home.  In the ED COVID-negative, chest x-ray showed basilar opacity at left lung base.  CT chest showed esophagitis, finding compatible with left-sided predominant  bronchopneumonia    Subjective   Patient seen and examined, feels better this morning.   Assessment/Plan:    Left lower lobe bronchopneumonia -Presented with fever, cough, bronchopneumonia seen on CT chest -Started on ceftriaxone and Zithromax -Procalcitonin 4.39, improved to 1.77 -afebrile for past 24 hours -WBC is down to 8.8  Left lower quadrant pain -Has history of diverticulosis  -CT abdomen/pelvis ordered today -Follow CT results   Tremors -Followed by neurology, continue primidone 100 mg p.o. nightly  Chronic back pain -Continue Neurontin, hydrocodone -Follow neurosurgery as outpatient  CKD stage III a -Creatinine 1.08  BPH -Continue Flomax 0.4 mg daily  Alcohol abuse Patient stated he drinks 3 glasses of wine at the end of the month when he runs out of hydrocodone  Diabetes mellitus type 2 -Continue sliding scale insulin NovoLog -CBG well-controlled  Hyperlipidemia -Continue Zetia, Crestor  Hypertension -Continue metoprolol -Blood pressure was still elevated,  started on  amlodipine 5 mg daily  Hypokalemia -Replete  Medications     amLODipine  5 mg Oral Daily   azithromycin  500 mg Oral Daily   ezetimibe  10 mg Oral Daily   furosemide  20 mg Oral Daily   gabapentin  300 mg Oral TID   heparin  5,000 Units Subcutaneous Q8H   insulin aspart  0-5 Units Subcutaneous QHS   insulin aspart  0-9 Units Subcutaneous TID WC   linagliptin  5 mg Oral  Daily   metoprolol succinate  50 mg Oral Daily   pantoprazole  40 mg Oral BID   primidone  100 mg Oral QHS   rosuvastatin  40 mg Oral Daily   tamsulosin  0.8 mg Oral Daily   venlafaxine XR  75 mg Oral Q breakfast     Data Reviewed:   CBG:  Recent Labs  Lab 11/21/22 1214 11/21/22 1640 11/21/22 2121 11/22/22 0739 11/22/22 1156  GLUCAP 132* 187* 105* 92 129*    SpO2: 98 %    Vitals:   11/21/22 2120 11/22/22 0517 11/22/22 0744 11/22/22 1534  BP: (!) 180/94 (!) 164/93 (!) 162/96 125/69  Pulse: (!) 57 62 65 71  Resp:   16 17  Temp: 99 F (37.2 C) 98.4 F (36.9 C) 98.6 F (37 C) 98.2 F (36.8 C)  TempSrc: Oral Oral Oral Oral  SpO2: 98% 97% 96% 98%  Weight:      Height:          Data Reviewed:  Basic Metabolic Panel: Recent Labs  Lab 11/19/22 1435 11/20/22 0230 11/21/22 0047  NA 139 141 135  K 3.5 3.4* 3.5  CL 108 106 103  CO2 GLUCOSE 170* 159* 191*  BUN CREATININE 1.25* 1.16 1.08  CALCIUM 8.0* 8.2* 7.9*  MG  --  1.7  --     CBC: Recent Labs  Lab 11/19/22 1435 11/20/22 0230 11/21/22 0047 11/22/22 0103  WBC 10.8* 15.9* 12.6* 8.8  NEUTROABS 10.1* 14.5*  --   --  HGB 12.9* 11.9* 10.7* 11.0*  HCT 40.8 35.1* 32.1* 34.2*  MCV 108.8* 106.4* 106.3* 107.2*  PLT 100* 99* 91* 114*    LFT Recent Labs  Lab 11/19/22 1435 11/20/22 0230 11/21/22 0047  AST 52* 33 22  ALT 60* 45* 33  ALKPHOS 50 34* 35*  BILITOT 0.7 0.5 0.3  PROT 5.8* 5.3* 5.1*  ALBUMIN 2.9* 2.5* 2.3*     Antibiotics: Anti-infectives (From admission, onward)    Start     Dose/Rate Route Frequency Ordered Stop   11/20/22 1400  azithromycin (ZITHROMAX) tablet 500 mg        500 mg Oral Daily 11/19/22 2351 11/25/22 0959   11/20/22 0000  cefTRIAXone (ROCEPHIN) 2 g in sodium chloride 0.9 % 100 mL IVPB        2 g 200 mL/hr over 30 Minutes Intravenous Daily at 10 pm 11/19/22 2351 11/24/22 2159   11/19/22 1645  cefTRIAXone (ROCEPHIN) 1 g in sodium chloride 0.9 %  100 mL IVPB        1 g 200 mL/hr over 30 Minutes Intravenous  Once 11/19/22 1631 11/19/22 1731   11/19/22 1645  azithromycin (ZITHROMAX) 500 mg in sodium chloride 0.9 % 250 mL IVPB        500 mg 250 mL/hr over 60 Minutes Intravenous  Once 11/19/22 1631 11/19/22 1828        DVT prophylaxis: Heparin  Code Status: Full code  Family Communication: No family at bedside   CONSULTS    Objective    Physical Examination:  General-appears in no acute distress Heart-S1-S2, regular, no murmur auscultated Lungs-clear to auscultation bilaterally, no wheezing or crackles auscultated Abdomen-soft, nontender, no organomegaly Extremities-no edema in the lower extremities Neuro-alert, oriented x3, no focal deficit noted   Status is: Inpatient:             [name]   [facility] If 7PM-7AM, please contact night-coverage at [email or web address], Office  [phone]   11/22/2022, 3:49 PM  LOS: 2 days

## 2022-11-22 NOTE — Progress Notes (Signed)
PRN NORCO pain med given to pt as requested. Next dose due at 7:20p. Pt made aware and new time written on white board in room. Pt verbalized understanding

## 2022-11-22 NOTE — Progress Notes (Signed)
PRN NORCO pain med given to pt as requested. Next dose due at 3:14p. Pt made aware and verbalized understanding. New time written on white board

## 2022-11-23 DIAGNOSIS — N400 Enlarged prostate without lower urinary tract symptoms: Secondary | ICD-10-CM | POA: Diagnosis not present

## 2022-11-23 DIAGNOSIS — J189 Pneumonia, unspecified organism: Secondary | ICD-10-CM | POA: Diagnosis not present

## 2022-11-23 DIAGNOSIS — G894 Chronic pain syndrome: Secondary | ICD-10-CM | POA: Diagnosis not present

## 2022-11-23 DIAGNOSIS — F1010 Alcohol abuse, uncomplicated: Secondary | ICD-10-CM | POA: Diagnosis not present

## 2022-11-23 LAB — CULTURE, BLOOD (ROUTINE X 2)

## 2022-11-23 LAB — GLUCOSE, CAPILLARY
Glucose-Capillary: 129 mg/dL — ABNORMAL HIGH (ref 70–99)
Glucose-Capillary: 182 mg/dL — ABNORMAL HIGH (ref 70–99)

## 2022-11-23 MED ORDER — AMLODIPINE BESYLATE 5 MG PO TABS: 5.0000 mg | ORAL_TABLET | Freq: Every day | ORAL | 3 refills | Status: DC

## 2022-11-23 MED ORDER — AMOXICILLIN-POT CLAVULANATE 875-125 MG PO TABS: 1.0000 | ORAL_TABLET | Freq: Two times a day (BID) | ORAL | 0 refills | Status: AC

## 2022-11-23 NOTE — Care Management Important Message (Signed)
Important Message  Patient Details  Name: LEGION DISCHER MRN: [id number] Date of Birth: Mar 29, 1951   Medicare Important Message Given:  Yes     Sherilyn Banker 11/23/2022, 1:34 PM

## 2022-11-23 NOTE — Discharge Summary (Signed)
Physician Discharge Summary   Patient: Justin Patel MRN: [id number] DOB: [birth year]  Admit date:     11/19/2022  Discharge date: 11/23/22  Discharge Physician: [name]   PCP: [name], MD   Recommendations at discharge:   Follow-up PCP in 1 week  Discharge Diagnoses: Principal Problem:   LLL pneumonia Active Problems:   Type 2 diabetes mellitus   Essential hypertension, benign   Alcohol abuse   BPH (benign prostatic hyperplasia)   Chronic pain syndrome   CKD stage 3a, GFR 45-59 ml/min   Chronic back pain   Tremor   Hyperlipidemia   Pneumonia  Resolved Problems:   * No resolved hospital problems. *  Hospital Course: 72 year old male with history of diabetes mellitus type 2, hypertension, chronic pain syndrome, chronic back pain, BPH came to ED with 2-week history of intermittent cough. Also was feeling feverish at home. In the ED COVID-negative, chest x-ray showed basilar opacity at left lung base. CT chest showed esophagitis, finding compatible with left-sided predominant bronchopneumonia    Assessment and Plan:  Left lower lobe bronchopneumonia -Presented with fever, cough, bronchopneumonia seen on CT chest -Started on ceftriaxone and Zithromax -Procalcitonin 4.39, improved to 1.77 -afebrile for past 24 hours -WBC is down to 8.8 -Will discharge on Augmentin 1 tablet p.o. twice daily for 5 days   Left lower quadrant pain -Has history of diverticulosis  -CT abdomen/pelvis was obtained, did not show diverticulitis -only showed diverticulosis, moderate stool burden    Tremors -Followed by neurology, continue primidone 100 mg p.o. nightly   Chronic back pain -Continue Neurontin, hydrocodone -Follow neurosurgery as outpatient   CKD stage III a -Creatinine 1.08   BPH -Continue Flomax 0.4 mg daily   Alcohol abuse Patient stated he drinks 3 glasses of wine at the end of the month when he runs out of hydrocodone   Diabetes mellitus type  2 -Continue home regimen, hemoglobin A1c 6.6   Hyperlipidemia -Continue Zetia, Crestor   Hypertension -Continue metoprolol -Blood pressure was still elevated,  started on  amlodipine 5 mg daily   Hypokalemia -Replete       Consultants:  Procedures performed:  Disposition: Home Diet recommendation:  Discharge Diet Orders (From admission, onward)     Start     Ordered   11/23/22 0000  Diet - low sodium heart healthy        11/23/22 1228           Regular diet DISCHARGE MEDICATION: Allergies as of 11/23/2022       Reactions   Altace [ramipril] Anaphylaxis   Ace Inhibitors Hives   Levaquin [levofloxacin In D5w] Hives   Lyrica [pregabalin] Other (See Comments)   Edema   Metformin Diarrhea   High doses cause diarrhea.   Trulicity [dulaglutide] Nausea And Vomiting        Medication List     STOP taking these medications    HYDROcodone-acetaminophen 7.5-325 MG tablet Commonly known as: NORCO   oxyCODONE-acetaminophen 7.5-325 MG tablet Commonly known as: PERCOCET       TAKE these medications    acetaminophen 325 MG tablet Commonly known as: TYLENOL Take 650 mg by mouth every 6 (six) hours as needed for moderate pain.   amLODipine 5 MG tablet Commonly known as: NORVASC Take 1 tablet (5 mg total) by mouth daily. Start taking on: November 24, 2022   amoxicillin-clavulanate 875-125 MG tablet Commonly known as: AUGMENTIN Take 1 tablet by mouth 2 (two) times daily for 5  days.   CALCIUM 600/VITAMIN D PO Take 1 tablet by mouth daily.   Cholecalciferol 50 MCG (2000 UT) Tabs Take 2,000 Units by mouth daily.   cyclobenzaprine 5 MG tablet Commonly known as: FLEXERIL TAKE 1 TABLET BY MOUTH EVERY 8 HOURS AS NEEDED FOR SPASMS. What changed:  how much to take how to take this when to take this reasons to take this additional instructions   esomeprazole 40 MG capsule Commonly known as: NEXIUM TAKE 1 CAPSULE BY MOUTH ONCE DAILY   ezetimibe 10 MG  tablet Commonly known as: ZETIA Take 1 tablet (10 mg total) by mouth daily.   furosemide 20 MG tablet Commonly known as: LASIX TAKE 1 TABLET BY MOUTH DAILY   gabapentin 300 MG capsule Commonly known as: NEURONTIN Take 1 capsule (300 mg total) by mouth in the morning AND 2 capsules (600 mg total) daily with lunch AND 1 capsule (300 mg total) at bedtime.   ketoconazole 2 % cream Commonly known as: NIZORAL Apply 1 Application topically daily.   metoprolol succinate 50 MG 24 hr tablet Commonly known as: TOPROL-XL TAKE 1 TABLET BY MOUTH DAILY WITH OR IMMEDIATLY FOLLOWING A MEAL What changed: See the new instructions.   multivitamin capsule Take 1 capsule by mouth daily. With copper and zinc What changed: additional instructions   nystatin-triamcinolone ointment Commonly known as: MYCOLOG Apply 1 application topically 2 (two) times daily.   primidone 50 MG tablet Commonly known as: MYSOLINE Take 2 tablets (100 mg total) by mouth at bedtime. What changed: how much to take   rosuvastatin 40 MG tablet Commonly known as: CRESTOR TAKE ONE TABLET EVERY DAY   sucralfate 1 g tablet Commonly known as: CARAFATE TAKE 1 TABLET BY MOUTH 4 TIMES DAILY What changed: when to take this   tamsulosin 0.4 MG Caps capsule Commonly known as: FLOMAX Take 2 capsules (0.8 mg total) by mouth daily.   Tradjenta 5 MG Tabs tablet Generic drug: linagliptin TAKE 1 TABLET BY MOUTH DAILY What changed: how much to take   venlafaxine XR 75 MG 24 hr capsule Commonly known as: EFFEXOR-XR TAKE ONE CAPSULE EVERY MORNING WITH BREAKFAST What changed: See the new instructions.        Follow-up Information     [name], MD Follow up in 1 week(s).   Specialty: Family Medicine Contact information: [address] [place] [address] [phone]                Discharge Exam: Ceasar Mons Weights   11/19/22 2356  Weight: 85.1 kg   General-appears in no acute  distress Heart-S1-S2, regular, no murmur auscultated Lungs-clear to auscultation bilaterally, no wheezing or crackles auscultated Abdomen-soft, nontender, no organomegaly Extremities-no edema in the lower extremities Neuro-alert, oriented x3, no focal deficit noted  Condition at discharge: good  The results of significant diagnostics from this hospitalization (including imaging, microbiology, ancillary and laboratory) are listed below for reference.   Imaging Studies: CT ABDOMEN PELVIS W CONTRAST  Result Date: 11/22/2022 CLINICAL DATA:  Acute abdominal pain EXAM: CT ABDOMEN AND PELVIS WITH CONTRAST TECHNIQUE: Multidetector CT imaging of the abdomen and pelvis was performed using the standard protocol following bolus administration of intravenous contrast. RADIATION DOSE REDUCTION: This exam was performed according to the departmental dose-optimization program which includes automated exposure control, adjustment of the mA and/or kV according to patient size and/or use of iterative reconstruction technique. CONTRAST:  75mL OMNIPAQUE IOHEXOL 350 MG/ML SOLN COMPARISON:  Abdomen and pelvis CT 06/07/2021. FINDINGS:  Lower chest: Tiny bilateral pleural effusions. There is some bandlike changes at the lung bases. Favor atelectasis. There is also some calcified lung nodules consistent with old granulomatous disease. Patulous lower esophagus. Subtle wall thickening. There is a hiatal hernia. Hepatobiliary: Previous cholecystectomy with a ectasia of the biliary tree but normal tapering of the common duct towards the pancreatic head. No space-occupying liver lesion. Patent portal vein. Pancreas: Global moderate atrophy of the pancreas. No pancreatic mass. Few punctate calcifications along the uncinate process. Please correlate for evidence of chronic calcific pancreatitis. Spleen: Normal in size without focal abnormality. Adrenals/Urinary Tract: The adrenal glands are preserved. 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 Largest is exophytic lateral from the left kidney measuring up to 6.5 cm in diameter and Hounsfield unit of 17. Largest on the right is exophytic posterolateral measuring 2.5 cm in diameter with Hounsfield unit of less than 0. Bosniak 1 lesions. These appear similar to prior. No specific imaging follow-up. Extrarenal pelvis noted to the left kidney. Prominent bilateral renal sinus fat. The ureters have normal course and caliber extending down to the bladder. Preserved contours of the urinary bladder. 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 Oral contrast seen along loops of small bowel. Moderate colonic stool. Left-sided colonic diverticula. No obstruction, free air or free fluid. The appendix is not clearly seen next to the cecum but there is no pericecal stranding or fluid. Vascular/Lymphatic: Normal caliber aorta and IVC. Mild atherosclerotic calcified plaque. No specific abnormal lymph node enlargement seen in the abdomen and pelvis. Reproductive: Heterogeneous prostate. Other: No free air or free fluid. Musculoskeletal: Degenerative changes seen of the spine and pelvis. There is curvature of the lumbar spine with significant streak artifact related to the extensive fixation hardware throughout the thoracolumbar spine. There is also evidence of previous hardware which has been removed along the mid to lower thoracic spine. Please correlate with clinical history. Stable mild compression deformity of L1 IMPRESSION: No bowel obstruction, free air or free fluid. Moderate stool. Left-sided colonic diverticula. Appendix not seen but no pericecal stranding or fluid. Please correlate with surgical history. 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 Component of the small hiatal hernia. Previous cholecystectomy.  Extensive spinal fixation hardware. Electronically Signed   By: Karen Kays M.D.    On: 11/22/2022 19:34   CT Chest Wo Contrast  Result Date: 11/19/2022 CLINICAL DATA:  Weakness; drug overdose; altered mental status EXAM: CT CHEST WITHOUT CONTRAST TECHNIQUE: Multidetector CT imaging of the chest was performed following the standard protocol without IV contrast. RADIATION DOSE REDUCTION: This exam was performed according to the departmental dose-optimization program which includes automated exposure control, adjustment of the mA and/or kV according to patient size and/or use of iterative reconstruction technique. COMPARISON:  Chest radiographs 11/19/2022 and CT chest 11/07/2017 FINDINGS: Cardiovascular: Normal heart size. Coronary artery and aortic atherosclerotic calcification. No pericardial effusion. The main pulmonary artery is dilated up to 38 mm in diameter. Mediastinum/Nodes: Mild circumferential thickening of the esophagus. Small hiatal hernia. Postoperative changes about the GE junction and proximal stomach. Calcified mediastinal and hilar nodes are unchanged. Lungs/Pleura: Patchy centrilobular predominant ground-glass and consolidative opacities in the right middle lobe, left upper lobe and greatest in the left lower lobe. Mild associated bronchial wall thickening and bronchiolectasis. No pleural effusion or pneumothorax. Scattered calcified granulomas. Upper Abdomen: Cholecystectomy.  No acute abnormality. Musculoskeletal: Remote left rib fractures. Cervical spine and thoracolumbar spine fusion hardware. No acute  osseous abnormality. Thoracic spondylosis. Remote sternal fracture. IMPRESSION: 1. Findings compatible with left-sided predominant bronchopneumonia. 2. Mild circumferential thickening of the esophagus, which can be seen in the setting of esophagitis. Small hiatal hernia. Recommend correlation for reflux. 3. Dilated main pulmonary artery, which can be seen in the setting of pulmonary hypertension Aortic Atherosclerosis (ICD10-I70.0). Electronically Signed   By: Minerva Fester  M.D.   On: 11/19/2022 21:52   DG Chest 2 View  Result Date: 11/19/2022 CLINICAL DATA:  Questionable sepsis, weakness, possible overdose EXAM: CHEST - 2 VIEW COMPARISON:  09/01/2022 FINDINGS: Cardiomegaly. Heterogeneous airspace opacity of the left lung base. Mild diffuse interstitial opacity. Disc degenerative disease of the thoracic spine. IMPRESSION: 1. Heterogeneous airspace opacity of the left lung base, concerning for infection or aspiration. 2. Mild diffuse interstitial opacity, likely edema. 3. Cardiomegaly. 4. Disc degenerative disease of the thoracic spine. Electronically Signed   By: Jearld Lesch M.D.   On: 11/19/2022 16:56    Microbiology: Results for orders placed or performed during the hospital encounter of 11/19/22  Blood Culture (routine x 2)     Status: None (Preliminary result)   Collection Time: 11/19/22  2:35 PM   Specimen: BLOOD  Result Value Ref Range Status   Specimen Description BLOOD RIGHT ANTECUBITAL  Final   Special Requests   Final    BOTTLES DRAWN AEROBIC AND ANAEROBIC Blood Culture adequate volume   Culture   Final    NO GROWTH 4 DAYS Performed at [facility], [address], [place], [place] [address]    Report Status PENDING  Incomplete  SARS Coronavirus 2 by RT PCR (hospital order, performed in [facility] hospital lab) *cepheid single result test*     Status: None   Collection Time: 11/19/22  4:16 PM   Specimen: Nasal Swab  Result Value Ref Range Status   SARS Coronavirus 2 by RT PCR NEGATIVE NEGATIVE Final    Comment: Performed at [facility], [address], [place], [place] [address]  Blood Culture (routine x 2)     Status: None (Preliminary result)   Collection Time: 11/19/22  4:30 PM   Specimen: BLOOD  Result Value Ref Range Status   Specimen Description BLOOD LEFT ANTECUBITAL  Final   Special Requests   Final    BOTTLES DRAWN AEROBIC AND ANAEROBIC Blood Culture adequate volume   Culture   Final    NO GROWTH 4 DAYS Performed at  [facility], [address], [place], [place] [address]    Report Status PENDING  Incomplete    Labs: CBC: Recent Labs  Lab 11/19/22 1435 11/20/22 0230 11/21/22 0047 11/22/22 0103  WBC 10.8* 15.9* 12.6* 8.8  NEUTROABS 10.1* 14.5*  --   --   HGB 12.9* 11.9* 10.7* 11.0*  HCT 40.8 35.1* 32.1* 34.2*  MCV 108.8* 106.4* 106.3* 107.2*  PLT 100* 99* 91* 114*   Basic Metabolic Panel: Recent Labs  Lab 11/19/22 1435 11/20/22 0230 11/21/22 0047  NA 139 141 135  K 3.5 3.4* 3.5  CL 108 106 103  CO2 23 24 23   GLUCOSE 170* 159* 191*  BUN 23 22 19   CREATININE 1.25* 1.16 1.08  CALCIUM 8.0* 8.2* 7.9*  MG  --  1.7  --    Liver Function Tests: Recent Labs  Lab 11/19/22 1435 11/20/22 0230 11/21/22 0047  AST 52* 33 22  ALT 60* 45* 33  ALKPHOS 50 34* 35*  BILITOT 0.7 0.5 0.3  PROT 5.8*  5.3* 5.1*  ALBUMIN 2.9* 2.5* 2.3*   CBG: Recent Labs  Lab 11/22/22 1156 11/22/22 1702 11/22/22 1933 11/23/22 0810 11/23/22 1201  GLUCAP 129* 129* 108* 129* 182*    Discharge time spent: greater than 30 minutes.  Signed: [name], MD [facility] 11/23/2022

## 2022-11-23 NOTE — Progress Notes (Signed)
Discharge instructions given to pt. Pt verbalized understanding of all teaching and had no further questions. Pt discharged to home with wife via wheelchair by NT

## 2022-11-24 ENCOUNTER — Telehealth: Payer: Self-pay | Admitting: *Deleted

## 2022-11-24 LAB — CULTURE, BLOOD (ROUTINE X 2): Culture: NO GROWTH

## 2022-11-24 NOTE — Transitions of Care (Post Inpatient/ED Visit) (Signed)
   11/24/2022  Name: Justin Patel MRN: [id number] DOB: [birth year]  Today's TOC FU Call Status: Today's TOC FU Call Status:: Unsuccessul Call (1st Attempt) Unsuccessful Call (1st Attempt) Date: 11/24/22  Attempted to reach the patient regarding the most recent Inpatient/ED visit.  Follow Up Plan: Additional outreach attempts will be made to reach the patient to complete the Transitions of Care (Post Inpatient/ED visit) call.   Gean Maidens BSN RN [facility] [phone]

## 2022-11-24 NOTE — Progress Notes (Signed)
Spoke with Mrs. Justin Patel, notified that patient did not have discharge medications available. AVS reviewed. Contacted home pharmacy on file, no medications were called in.  Paged the Hospitalist team 8. Norvasc and Augmentin called into [facility] [phone], per Dr. Sunnie Nielsen. Voicemail left for patient and spouse to pick up meds.

## 2022-11-26 ENCOUNTER — Telehealth: Payer: Self-pay | Admitting: *Deleted

## 2022-11-26 NOTE — Transitions of Care (Post Inpatient/ED Visit) (Signed)
   11/26/2022  Name: Justin Patel MRN: [id number] DOB: 1951-08-01  Today's TOC FU Call Status: Today's TOC FU Call Status:: Successful TOC FU Call Competed TOC FU Call Complete Date: 11/26/22  Transition Care Management Follow-up Telephone Call Date of Discharge: 11/23/22 Discharge Facility: [facility]) Type of Discharge: Inpatient Admission Primary Inpatient Discharge Diagnosis:: LLL Pneumonia How have you been since you were released from the hospital?: Better (I am still very sore aroung my back. I am still coughing) Any questions or concerns?: No  Items Reviewed: Did you receive and understand the discharge instructions provided?: Yes Medications obtained and verified?: Yes (Medications Reviewed) (Per patient he is still taking the hydrocodone. It was prescribed by the pain clinic.) Any new allergies since your discharge?: No Dietary orders reviewed?: No Do you have support at home?: Yes People in Home: spouse Name of Support/Comfort Primary Source: [facility] and Equipment/Supplies: Were Home Health Services Ordered?: No Any new equipment or medical supplies ordered?: No  Functional Questionnaire: Do you need assistance with bathing/showering or dressing?: No Do you need assistance with meal preparation?: Yes Do you need assistance with eating?: No Do you have difficulty maintaining continence: No Do you need assistance with getting out of bed/getting out of a chair/moving?: No Do you have difficulty managing or taking your medications?: No  Follow up appointments reviewed: PCP Follow-up appointment confirmed?: Yes Date of PCP follow-up appointment?: 12/01/22 Follow-up Provider: Dr Birdie Sons 11:00 Specialist [facility] Follow-up appointment confirmed?: NA Do you need transportation to your follow-up appointment?: No Do you understand care options if your condition(s) worsen?: Yes-patient verbalized understanding  SDOH Interventions Today    Flowsheet  Row Most Recent Value  SDOH Interventions   Food Insecurity Interventions Intervention Not Indicated  Housing Interventions Intervention Not Indicated  Transportation Interventions Intervention Not Indicated, Patient Resources (Friends/Family)      Interventions Today    Flowsheet Row Most Recent Value  General Interventions   General Interventions Discussed/Reviewed General Interventions Discussed, General Interventions Reviewed, Doctor Visits  Doctor Visits Discussed/Reviewed Doctor Visits Discussed, Doctor Visits Reviewed  Pharmacy Interventions   Pharmacy Dicussed/Reviewed Pharmacy Topics Discussed, Pharmacy Topics Reviewed  [per patient heis still taking the hydrocodone. He stated it was prescribed by the pain clinic. RN made patient aware of what the AVS stated.]      TOC Interventions Today    Flowsheet Row Most Recent Value  TOC Interventions   TOC Interventions Discussed/Reviewed TOC Interventions Discussed, TOC Interventions Reviewed, Arranged PCP follow up within 7 days/Care Guide scheduled       Gean Maidens BSN RN [facility] [phone]

## 2022-11-29 DIAGNOSIS — M5416 Radiculopathy, lumbar region: Secondary | ICD-10-CM | POA: Diagnosis not present

## 2022-12-01 ENCOUNTER — Encounter: Payer: Self-pay | Admitting: Family Medicine

## 2022-12-01 ENCOUNTER — Ambulatory Visit: Payer: Medicare PPO | Admitting: Family Medicine

## 2022-12-01 VITALS — BP 120/80 | HR 78 | Temp 98.1°F | Ht 66.0 in | Wt 178.8 lb

## 2022-12-01 DIAGNOSIS — J189 Pneumonia, unspecified organism: Secondary | ICD-10-CM | POA: Diagnosis not present

## 2022-12-01 DIAGNOSIS — D7589 Other specified diseases of blood and blood-forming organs: Secondary | ICD-10-CM

## 2022-12-01 LAB — COMPREHENSIVE METABOLIC PANEL
ALT: 41 U/L (ref 0–53)
AST: 42 U/L — ABNORMAL HIGH (ref 0–37)
Albumin: 4 g/dL (ref 3.5–5.2)
Alkaline Phosphatase: 56 U/L (ref 39–117)
BUN: 15 mg/dL (ref 6–23)
CO2: 28 mEq/L (ref 19–32)
Calcium: 9.7 mg/dL (ref 8.4–10.5)
Chloride: 100 mEq/L (ref 96–112)
Creatinine, Ser: 1.08 mg/dL (ref 0.40–1.50)
GFR: 68.9 mL/min (ref >60.00)
Glucose, Bld: 154 mg/dL — ABNORMAL HIGH (ref 70–99)
Potassium: 4.1 mEq/L (ref 3.5–5.1)
Sodium: 140 mEq/L (ref 135–145)
Total Bilirubin: 0.3 mg/dL (ref 0.2–1.2)
Total Protein: 6.8 g/dL (ref 6.0–8.3)

## 2022-12-01 LAB — VITAMIN B12: Vitamin B-12: 771 pg/mL (ref 211–911)

## 2022-12-01 LAB — CBC
HCT: 43.2 % (ref 39.0–52.0)
Hemoglobin: 14.2 g/dL (ref 13.0–17.0)
MCHC: 32.9 g/dL (ref 30.0–36.0)
MCV: 105.7 fl — ABNORMAL HIGH (ref 78.0–100.0)
Platelets: 423 10*3/uL — ABNORMAL HIGH (ref 150.0–400.0)
RBC: 4.09 Mil/uL — ABNORMAL LOW (ref 4.22–5.81)
RDW: 13.8 % (ref 11.5–15.5)
WBC: 8.6 10*3/uL (ref 4.0–10.5)

## 2022-12-01 LAB — FOLATE: Folate: 6.7 ng/mL (ref >5.9)

## 2022-12-01 MED ORDER — BENZONATATE 200 MG PO CAPS
200.0000 mg | ORAL_CAPSULE | Freq: Two times a day (BID) | ORAL | 0 refills | Status: DC | PRN
Start: 2022-12-01 — End: 2022-12-22

## 2022-12-01 NOTE — Assessment & Plan Note (Signed)
Patient with recent hospitalization for pneumonia.  Has recovered at this time.  Discussed checking a chest x-ray in 3 weeks to follow-up for resolution.  Patient will monitor for any recurrent symptoms.  Will treat his cough with Tessalon.

## 2022-12-01 NOTE — Assessment & Plan Note (Signed)
Noted on labs from hospitalization.  Recheck this as well as folate and B12.

## 2022-12-01 NOTE — Progress Notes (Signed)
Marikay Alar, MD Phone: [phone]  Justin Patel is a 72 y.o. male who presents today for hospital follow-up.  Multifocal pneumonia: Patient developed onset of fevers and cough.  Eventually he presented to the emergency department via EMS.  He was found to have multifocal pneumonia on CT imaging and met sepsis criteria.  He was treated with IV antibiotics and progressively improved.  He is transition to oral Augmentin and has completed that at this time.  He notes his breathing is okay.  He notes no fever since discharge from hospital.  His cough is progressively improving.  He notes the cough is worse at night.  He is not currently taking his chronic pain medications.  He has not been drinking any alcohol.  He notes he got a injection in his back yesterday.  Social History   Tobacco Use  Smoking Status Never  Smokeless Tobacco Never    Current Outpatient Medications on File Prior to Visit  Medication Sig Dispense Refill   acetaminophen (TYLENOL) 325 MG tablet Take 650 mg by mouth every 6 (six) hours as needed for moderate pain.     amLODipine (NORVASC) 5 MG tablet Take 1 tablet (5 mg total) by mouth daily. 30 tablet 3   Calcium Carbonate-Vitamin D (CALCIUM 600/VITAMIN D PO) Take 1 tablet by mouth daily.     Cholecalciferol 50 MCG (2000 UT) TABS Take 2,000 Units by mouth daily.     cyclobenzaprine (FLEXERIL) 5 MG tablet TAKE 1 TABLET BY MOUTH EVERY 8 HOURS AS NEEDED FOR SPASMS. (Patient taking differently: Take 5 mg by mouth as needed for muscle spasms.) 30 tablet 1   esomeprazole (NEXIUM) 40 MG capsule TAKE 1 CAPSULE BY MOUTH ONCE DAILY (Patient taking differently: Take 40 mg by mouth daily.) 90 capsule 1   ezetimibe (ZETIA) 10 MG tablet Take 1 tablet (10 mg total) by mouth daily. 90 tablet 1   furosemide (LASIX) 20 MG tablet TAKE 1 TABLET BY MOUTH DAILY 90 tablet 1   gabapentin (NEURONTIN) 300 MG capsule Take 1 capsule (300 mg total) by mouth in the morning AND 2 capsules (600 mg  total) daily with lunch AND 1 capsule (300 mg total) at bedtime. 360 capsule 1   ketoconazole (NIZORAL) 2 % cream Apply 1 Application topically daily. 15 g 0   metoprolol succinate (TOPROL-XL) 50 MG 24 hr tablet TAKE 1 TABLET BY MOUTH DAILY WITH OR IMMEDIATLY FOLLOWING A MEAL (Patient taking differently: Take 50 mg by mouth daily.) 90 tablet 1   Multiple Vitamin (MULTIVITAMIN) capsule Take 1 capsule by mouth daily. With copper and zinc (Patient taking differently: Take 1 capsule by mouth daily.)     nystatin-triamcinolone ointment (MYCOLOG) Apply 1 application topically 2 (two) times daily. 30 g 0   primidone (MYSOLINE) 50 MG tablet Take 2 tablets (100 mg total) by mouth at bedtime. (Patient taking differently: Take 50 mg by mouth at bedtime.) 60 tablet 5   rosuvastatin (CRESTOR) 40 MG tablet TAKE ONE TABLET EVERY DAY (Patient taking differently: Take 40 mg by mouth daily.) 30 tablet 6   sucralfate (CARAFATE) 1 g tablet TAKE 1 TABLET BY MOUTH 4 TIMES DAILY (Patient taking differently: Take 1 g by mouth in the morning, at noon, in the evening, and at bedtime.) 360 tablet 1   tamsulosin (FLOMAX) 0.4 MG CAPS capsule Take 2 capsules (0.8 mg total) by mouth daily. 180 capsule 1   TRADJENTA 5 MG TABS tablet TAKE 1 TABLET BY MOUTH DAILY (Patient taking differently: Take  5 mg by mouth daily.) 90 tablet 1   venlafaxine XR (EFFEXOR-XR) 75 MG 24 hr capsule TAKE ONE CAPSULE EVERY MORNING WITH BREAKFAST (Patient taking differently: Take 75 mg by mouth daily.) 90 capsule 1   No current facility-administered medications on file prior to visit.     ROS see history of present illness  Objective  Physical Exam Vitals:   12/01/22 1051  BP: 120/80  Pulse: 78  Temp: 98.1 F (36.7 C)  SpO2: 96%    BP Readings from Last 3 Encounters:  12/01/22 120/80  11/23/22 123/79  11/10/22 118/78   Wt Readings from Last 3 Encounters:  12/01/22 178 lb 12 oz (81.1 kg)  11/19/22 187 lb 9.8 oz (85.1 kg)  11/10/22 178  lb (80.7 kg)    Physical Exam Constitutional:      General: He is not in acute distress.    Appearance: He is not diaphoretic.  Cardiovascular:     Rate and Rhythm: Normal rate and regular rhythm.     Heart sounds: Normal heart sounds.  Pulmonary:     Effort: Pulmonary effort is normal.     Breath sounds: Normal breath sounds.  Skin:    General: Skin is warm and dry.  Neurological:     Mental Status: He is alert.      Assessment/Plan: Please see individual problem list.  Multifocal pneumonia -     DG Chest 2 View; Future -     Comprehensive metabolic panel  Macrocytosis Assessment & Plan: Noted on labs from hospitalization.  Recheck this as well as folate and B12.  Orders: -     CBC -     Vitamin B12 -     Folate  Pneumonia of left lower lobe due to infectious organism Assessment & Plan: Patient with recent hospitalization for pneumonia.  Has recovered at this time.  Discussed checking a chest x-ray in 3 weeks to follow-up for resolution.  Patient will monitor for any recurrent symptoms.  Will treat his cough with Tessalon.  Orders: -     Benzonatate; Take 1 capsule (200 mg total) by mouth 2 (two) times daily as needed for cough.  Dispense: 20 capsule; Refill: 0     Return in about 3 weeks (around 12/22/2022) for CXR.   Marikay Alar, MD [facility]

## 2022-12-01 NOTE — Patient Instructions (Signed)
Nice to see. Will try Tessalon Perles for your cough.  If this is not helpful please let us know. Will contact you with your lab results. You will return for your chest x-ray in 3 weeks.

## 2022-12-07 ENCOUNTER — Telehealth: Payer: Self-pay | Admitting: Neurology

## 2022-12-07 NOTE — Telephone Encounter (Signed)
Per patient he would rather stay on 50 mg right now.  If Dr.Jaffe agrees please send in a script.

## 2022-12-07 NOTE — Telephone Encounter (Signed)
Pt called in and left a message. He stated he is still having the same results from the reduction in primidone. He is going to go back to his original dosage of 50mg . If Dr. Everlena Cooper is not ok with that then he would like a call.

## 2022-12-08 ENCOUNTER — Other Ambulatory Visit: Payer: Self-pay | Admitting: Neurology

## 2022-12-08 MED ORDER — PRIMIDONE 50 MG PO TABS: 50.0000 mg | ORAL_TABLET | Freq: Every day | ORAL | 5 refills | Status: DC

## 2022-12-08 NOTE — Telephone Encounter (Signed)
Patient advised.

## 2022-12-16 ENCOUNTER — Encounter: Payer: Self-pay | Admitting: *Deleted

## 2022-12-17 ENCOUNTER — Ambulatory Visit
Admission: RE | Admit: 2022-12-17 | Discharge: 2022-12-17 | Disposition: A | Discharge status: Home / Self Care | Payer: Medicare PPO | Source: Ambulatory Visit | Attending: Family Medicine | Admitting: Family Medicine

## 2022-12-17 ENCOUNTER — Ambulatory Visit
Admission: RE | Admit: 2022-12-17 | Discharge: 2022-12-17 | Disposition: A | Discharge status: Home / Self Care | Payer: Medicare PPO | Attending: Family Medicine | Admitting: Family Medicine

## 2022-12-17 DIAGNOSIS — J189 Pneumonia, unspecified organism: Secondary | ICD-10-CM

## 2022-12-17 DIAGNOSIS — M810 Age-related osteoporosis without current pathological fracture: Secondary | ICD-10-CM | POA: Diagnosis not present

## 2022-12-20 ENCOUNTER — Telehealth: Payer: Self-pay | Admitting: Family Medicine

## 2022-12-20 NOTE — Telephone Encounter (Signed)
Pt would like to be called regarding his test results

## 2022-12-21 DIAGNOSIS — M810 Age-related osteoporosis without current pathological fracture: Secondary | ICD-10-CM | POA: Diagnosis not present

## 2022-12-21 DIAGNOSIS — E559 Vitamin D deficiency, unspecified: Secondary | ICD-10-CM | POA: Diagnosis not present

## 2022-12-21 NOTE — Telephone Encounter (Signed)
Spoke with pt to let him know that we have not received the results of his chest xray yet and let him know that they are taking longer to be resulted right now. I scheduled pt for a follow up with you tomorrow afternoon because he stated that the cough has came back and he is having some SOBr with execration. Pt recently had pneumonia. Pt was advised that if symptoms worsened before his appt to please go to the ED for evaluation.

## 2022-12-22 ENCOUNTER — Encounter: Payer: Self-pay | Admitting: Family Medicine

## 2022-12-22 ENCOUNTER — Ambulatory Visit: Payer: Medicare PPO | Admitting: Family Medicine

## 2022-12-22 ENCOUNTER — Other Ambulatory Visit: Payer: Medicare PPO

## 2022-12-22 VITALS — BP 120/74 | HR 70 | Temp 97.8°F | Ht 66.0 in | Wt 181.6 lb

## 2022-12-22 DIAGNOSIS — R051 Acute cough: Secondary | ICD-10-CM

## 2022-12-22 DIAGNOSIS — E119 Type 2 diabetes mellitus without complications: Secondary | ICD-10-CM

## 2022-12-22 DIAGNOSIS — Z7984 Long term (current) use of oral hypoglycemic drugs: Secondary | ICD-10-CM

## 2022-12-22 HISTORY — DX: Acute cough: R05.1

## 2022-12-22 LAB — POC COVID19 BINAXNOW: SARS Coronavirus 2 Ag: NEGATIVE

## 2022-12-22 LAB — POCT GLYCOSYLATED HEMOGLOBIN (HGB A1C): Hemoglobin A1C: 6.9 % — AB (ref 4.0–5.6)

## 2022-12-22 LAB — GLUCOSE, POCT (MANUAL RESULT ENTRY): POC Glucose: 139 mg/dL — AB (ref 70–99)

## 2022-12-22 MED ORDER — BENZONATATE 200 MG PO CAPS
200.0000 mg | ORAL_CAPSULE | Freq: Two times a day (BID) | ORAL | 0 refills | Status: DC | PRN
Start: 2022-12-22 — End: 2023-01-26

## 2022-12-22 MED ORDER — DOXYCYCLINE HYCLATE 100 MG PO TABS
100.0000 mg | ORAL_TABLET | Freq: Two times a day (BID) | ORAL | 0 refills | Status: DC
Start: 2022-12-22 — End: 2023-01-26

## 2022-12-22 NOTE — Progress Notes (Signed)
Marikay Alar, MD Phone: [phone]  Justin Patel is a 72 y.o. male who presents today for same-day visit.  Cough: Patient notes onset of symptoms about a week ago.  He notes he never fully recovered after his prior pneumonia diagnosis and treatment.  He has had cough with shortness of breath with exertion.  Has had sore throat and runny nose.  He feels woozy and weak.  He notes no fevers.  He notes some decreased taste sensation though is still able to taste.  He notes Tessalon was helpful when he was using it previously.  Social History   Tobacco Use  Smoking Status Never  Smokeless Tobacco Never    Current Outpatient Medications on File Prior to Visit  Medication Sig Dispense Refill   acetaminophen (TYLENOL) 325 MG tablet Take 650 mg by mouth every 6 (six) hours as needed for moderate pain.     amLODipine (NORVASC) 5 MG tablet Take 1 tablet (5 mg total) by mouth daily. 30 tablet 3   Calcium Carbonate-Vitamin D (CALCIUM 600/VITAMIN D PO) Take 1 tablet by mouth daily.     Cholecalciferol 50 MCG (2000 UT) TABS Take 2,000 Units by mouth daily.     cyclobenzaprine (FLEXERIL) 5 MG tablet TAKE 1 TABLET BY MOUTH EVERY 8 HOURS AS NEEDED FOR SPASMS. (Patient taking differently: Take 5 mg by mouth as needed for muscle spasms.) 30 tablet 1   esomeprazole (NEXIUM) 40 MG capsule TAKE 1 CAPSULE BY MOUTH ONCE DAILY (Patient taking differently: Take 40 mg by mouth daily.) 90 capsule 1   ezetimibe (ZETIA) 10 MG tablet Take 1 tablet (10 mg total) by mouth daily. 90 tablet 1   furosemide (LASIX) 20 MG tablet TAKE 1 TABLET BY MOUTH DAILY 90 tablet 1   gabapentin (NEURONTIN) 300 MG capsule Take 1 capsule (300 mg total) by mouth in the morning AND 2 capsules (600 mg total) daily with lunch AND 1 capsule (300 mg total) at bedtime. 360 capsule 1   ketoconazole (NIZORAL) 2 % cream Apply 1 Application topically daily. 15 g 0   metoprolol succinate (TOPROL-XL) 50 MG 24 hr tablet TAKE 1 TABLET BY MOUTH  DAILY WITH OR IMMEDIATLY FOLLOWING A MEAL (Patient taking differently: Take 50 mg by mouth daily.) 90 tablet 1   Multiple Vitamin (MULTIVITAMIN) capsule Take 1 capsule by mouth daily. With copper and zinc (Patient taking differently: Take 1 capsule by mouth daily.)     primidone (MYSOLINE) 50 MG tablet Take 1 tablet (50 mg total) by mouth at bedtime. 30 tablet 5   rosuvastatin (CRESTOR) 40 MG tablet TAKE ONE TABLET EVERY DAY (Patient taking differently: Take 40 mg by mouth daily.) 30 tablet 6   sucralfate (CARAFATE) 1 g tablet TAKE 1 TABLET BY MOUTH 4 TIMES DAILY (Patient taking differently: Take 1 g by mouth in the morning, at noon, in the evening, and at bedtime.) 360 tablet 1   tamsulosin (FLOMAX) 0.4 MG CAPS capsule Take 2 capsules (0.8 mg total) by mouth daily. 180 capsule 1   TRADJENTA 5 MG TABS tablet TAKE 1 TABLET BY MOUTH DAILY (Patient taking differently: Take 5 mg by mouth daily.) 90 tablet 1   venlafaxine XR (EFFEXOR-XR) 75 MG 24 hr capsule TAKE ONE CAPSULE EVERY MORNING WITH BREAKFAST (Patient taking differently: Take 75 mg by mouth daily.) 90 capsule 1   No current facility-administered medications on file prior to visit.     ROS see history of present illness  Objective  Physical Exam Vitals:  12/22/22 1552  BP: 120/74  Pulse: 70  Temp: 97.8 F (36.6 C)  SpO2: 96%    BP Readings from Last 3 Encounters:  12/22/22 120/74  12/01/22 120/80  11/23/22 123/79   Wt Readings from Last 3 Encounters:  12/22/22 181 lb 9.6 oz (82.4 kg)  12/01/22 178 lb 12 oz (81.1 kg)  11/19/22 187 lb 9.8 oz (85.1 kg)    Physical Exam Constitutional:      General: He is not in acute distress.    Appearance: He is not diaphoretic.     Comments: Patient appears as though he does not feel well  HENT:     Right Ear: Tympanic membrane normal.     Ears:     Comments: Left TM obscured by cerumen Cardiovascular:     Rate and Rhythm: Normal rate and regular rhythm.     Heart sounds: Normal  heart sounds.  Pulmonary:     Effort: Pulmonary effort is normal.     Breath sounds: Normal breath sounds.  Skin:    General: Skin is warm and dry.  Neurological:     Mental Status: He is alert.       Glucose 139, A1c 6.9  Assessment/Plan: Please see individual problem list.  Acute cough Assessment & Plan: Patient with cough over the last week or so.  Rapid COVID test negative.  We did check his glucose given his woozy feeling and his glucose was acceptable.  The woozy sensation may be a side effect from his gabapentin or from the infection he currently has.  We will check a chest x-ray today and lab work to evaluate for other underlying causes of his symptoms.  I will start him on doxycycline to cover for bronchitis.  Discussed if he had pneumonia on x-ray would need to expand coverage.  He can take Tessalon for cough.  Advised if he starts to feel worse he needs to be evaluated in the emergency room.  Orders: -     Benzonatate; Take 1 capsule (200 mg total) by mouth 2 (two) times daily as needed for cough.  Dispense: 20 capsule; Refill: 0 -     CBC with Differential/Platelet -     Comprehensive metabolic panel -     POC COVID-19 BinaxNow -     Doxycycline Hyclate; Take 1 tablet (100 mg total) by mouth 2 (two) times daily.  Dispense: 14 tablet; Refill: 0 -     DG Chest 2 View; Future  Type 2 diabetes mellitus without complication, without long-term current use of insulin (HCC) -     POCT glucose (manual entry) -     POCT glycosylated hemoglobin (Hb A1C)    Return if symptoms worsen or fail to improve.   Marikay Alar, MD [facility]

## 2022-12-22 NOTE — Assessment & Plan Note (Signed)
Patient with cough over the last week or so.  Rapid COVID test negative.  We did check his glucose given his woozy feeling and his glucose was acceptable.  The woozy sensation may be a side effect from his gabapentin or from the infection he currently has.  We will check a chest x-ray today and lab work to evaluate for other underlying causes of his symptoms.  I will start him on doxycycline to cover for bronchitis.  Discussed if he had pneumonia on x-ray would need to expand coverage.  He can take Tessalon for cough.  Advised if he starts to feel worse he needs to be evaluated in the emergency room.

## 2022-12-22 NOTE — Patient Instructions (Signed)
Nice to see you. If you start to feel worse please go to the hospital for evaluation. We are starting you on doxycycline to cover for possible bronchitis.  You can take the Tessalon for cough.  Will contact you with your lab results and x-ray result.

## 2022-12-22 NOTE — Telephone Encounter (Signed)
Noted. Plan to see him today as scheduled.

## 2022-12-23 LAB — COMPREHENSIVE METABOLIC PANEL
ALT: 53 U/L (ref 0–53)
AST: 24 U/L (ref 0–37)
Albumin: 3.6 g/dL (ref 3.5–5.2)
Alkaline Phosphatase: 44 U/L (ref 39–117)
BUN: 20 mg/dL (ref 6–23)
CO2: 30 mEq/L (ref 19–32)
Calcium: 8.9 mg/dL (ref 8.4–10.5)
Chloride: 104 mEq/L (ref 96–112)
Creatinine, Ser: 1.37 mg/dL (ref 0.40–1.50)
GFR: 51.77 mL/min — ABNORMAL LOW (ref >60.00)
Glucose, Bld: 137 mg/dL — ABNORMAL HIGH (ref 70–99)
Potassium: 3.8 mEq/L (ref 3.5–5.1)
Sodium: 143 mEq/L (ref 135–145)
Total Bilirubin: 0.2 mg/dL (ref 0.2–1.2)
Total Protein: 5.9 g/dL — ABNORMAL LOW (ref 6.0–8.3)

## 2022-12-23 LAB — CBC WITH DIFFERENTIAL/PLATELET
Basophils Absolute: 0 10*3/uL (ref 0.0–0.1)
Basophils Relative: 0.6 % (ref 0.0–3.0)
Eosinophils Absolute: 0.3 10*3/uL (ref 0.0–0.7)
Eosinophils Relative: 4.4 % (ref 0.0–5.0)
HCT: 39.4 % (ref 39.0–52.0)
Hemoglobin: 13 g/dL (ref 13.0–17.0)
Lymphocytes Relative: 28.2 % (ref 12.0–46.0)
Lymphs Abs: 1.8 10*3/uL (ref 0.7–4.0)
MCHC: 32.9 g/dL (ref 30.0–36.0)
MCV: 106.5 fl — ABNORMAL HIGH (ref 78.0–100.0)
Monocytes Absolute: 0.5 10*3/uL (ref 0.1–1.0)
Monocytes Relative: 7.5 % (ref 3.0–12.0)
Neutro Abs: 3.9 10*3/uL (ref 1.4–7.7)
Neutrophils Relative %: 59.3 % (ref 43.0–77.0)
Platelets: 156 10*3/uL (ref 150.0–400.0)
RBC: 3.7 Mil/uL — ABNORMAL LOW (ref 4.22–5.81)
RDW: 14.9 % (ref 11.5–15.5)
WBC: 6.5 10*3/uL (ref 4.0–10.5)

## 2023-01-04 ENCOUNTER — Other Ambulatory Visit: Payer: Self-pay | Admitting: Family Medicine

## 2023-01-04 DIAGNOSIS — E785 Hyperlipidemia, unspecified: Secondary | ICD-10-CM

## 2023-01-06 MED FILL — Iron Sucrose Inj 20 MG/ML (Fe Equiv): INTRAVENOUS | Qty: 10 | Status: AC

## 2023-01-10 ENCOUNTER — Inpatient Hospital Stay: Payer: Medicare PPO

## 2023-01-10 ENCOUNTER — Inpatient Hospital Stay: Payer: Medicare PPO | Admitting: Internal Medicine

## 2023-01-11 ENCOUNTER — Other Ambulatory Visit: Payer: Self-pay

## 2023-01-11 ENCOUNTER — Telehealth: Payer: Self-pay | Admitting: Family Medicine

## 2023-01-11 DIAGNOSIS — E119 Type 2 diabetes mellitus without complications: Secondary | ICD-10-CM

## 2023-01-11 DIAGNOSIS — E785 Hyperlipidemia, unspecified: Secondary | ICD-10-CM

## 2023-01-11 DIAGNOSIS — R600 Localized edema: Secondary | ICD-10-CM

## 2023-01-11 DIAGNOSIS — M5136 Other intervertebral disc degeneration, lumbar region: Secondary | ICD-10-CM

## 2023-01-11 DIAGNOSIS — I10 Essential (primary) hypertension: Secondary | ICD-10-CM

## 2023-01-11 MED ORDER — SUCRALFATE 1 G PO TABS: 1.0000 g | ORAL_TABLET | Freq: Four times a day (QID) | ORAL | 1 refills | Status: DC

## 2023-01-11 MED ORDER — GABAPENTIN 300 MG PO CAPS
ORAL_CAPSULE | ORAL | 0 refills | Status: DC
Start: 2023-01-11 — End: 2023-02-23

## 2023-01-11 MED ORDER — METOPROLOL SUCCINATE ER 50 MG PO TB24
50.0000 mg | ORAL_TABLET | Freq: Every day | ORAL | 0 refills | Status: DC
Start: 2023-01-11 — End: 2023-02-01

## 2023-01-11 MED ORDER — ESOMEPRAZOLE MAGNESIUM 40 MG PO CPDR: 40.0000 mg | DELAYED_RELEASE_CAPSULE | Freq: Every day | ORAL | 0 refills | Status: DC

## 2023-01-11 MED ORDER — EZETIMIBE 10 MG PO TABS
10.0000 mg | ORAL_TABLET | Freq: Every day | ORAL | 3 refills | Status: DC
Start: 2023-01-11 — End: 2023-09-26

## 2023-01-11 MED ORDER — AMLODIPINE BESYLATE 5 MG PO TABS: 5.0000 mg | ORAL_TABLET | Freq: Every day | ORAL | 0 refills | Status: DC

## 2023-01-11 MED ORDER — FUROSEMIDE 20 MG PO TABS
20.0000 mg | ORAL_TABLET | Freq: Every day | ORAL | 0 refills | Status: DC
Start: 2023-01-11 — End: 2023-02-01

## 2023-01-11 MED ORDER — ROSUVASTATIN CALCIUM 40 MG PO TABS
40.0000 mg | ORAL_TABLET | Freq: Every day | ORAL | 0 refills | Status: DC
Start: 2023-01-11 — End: 2023-02-01
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
## 2023-01-11 NOTE — Telephone Encounter (Signed)
Prescription sent in  

## 2023-01-11 NOTE — Telephone Encounter (Signed)
Patient's wife called [facility] did not get the prescription for ezetimibe (ZETIA) 10 MG tablet, please resend.

## 2023-01-11 NOTE — Telephone Encounter (Signed)
Zetia sent to [facility]

## 2023-01-11 NOTE — Telephone Encounter (Signed)
Spoke to wife, She has spoken with [facility] & they will fill a 30 day supply on the meds requested. They do no have a timeframe at the moment as to when they car will be ready. But they are doing okay.

## 2023-01-11 NOTE — Telephone Encounter (Signed)
Patient's wife called back they need to add sucralfate (CARAFATE) 1 g tablet to the refills.

## 2023-01-25 MED FILL — Iron Sucrose Inj 20 MG/ML (Fe Equiv): INTRAVENOUS | Qty: 10 | Status: AC

## 2023-01-26 ENCOUNTER — Encounter: Payer: Self-pay | Admitting: Internal Medicine

## 2023-01-26 ENCOUNTER — Inpatient Hospital Stay: Payer: Medicare PPO

## 2023-01-26 ENCOUNTER — Inpatient Hospital Stay: Payer: Medicare PPO | Admitting: Internal Medicine

## 2023-01-26 ENCOUNTER — Inpatient Hospital Stay: Payer: Medicare PPO | Attending: Internal Medicine

## 2023-01-26 DIAGNOSIS — E538 Deficiency of other specified B group vitamins: Secondary | ICD-10-CM | POA: Diagnosis not present

## 2023-01-26 DIAGNOSIS — N183 Chronic kidney disease, stage 3 unspecified: Secondary | ICD-10-CM | POA: Diagnosis not present

## 2023-01-26 DIAGNOSIS — E611 Iron deficiency: Secondary | ICD-10-CM

## 2023-01-26 LAB — CBC WITH DIFFERENTIAL (CANCER CENTER ONLY)
Abs Immature Granulocytes: 0.02 10*3/uL (ref 0.00–0.07)
Basophils Absolute: 0 10*3/uL (ref 0.0–0.1)
Basophils Relative: 1 %
Eosinophils Absolute: 0.1 10*3/uL (ref 0.0–0.5)
Eosinophils Relative: 1 %
HCT: 40.2 % (ref 39.0–52.0)
Hemoglobin: 13.2 g/dL (ref 13.0–17.0)
Immature Granulocytes: 0 %
Lymphocytes Relative: 24 %
Lymphs Abs: 1.4 10*3/uL (ref 0.7–4.0)
MCH: 34.6 pg — ABNORMAL HIGH (ref 26.0–34.0)
MCHC: 32.8 g/dL (ref 30.0–36.0)
MCV: 105.2 fL — ABNORMAL HIGH (ref 80.0–100.0)
Monocytes Absolute: 0.5 10*3/uL (ref 0.1–1.0)
Monocytes Relative: 8 %
Neutro Abs: 3.8 10*3/uL (ref 1.7–7.7)
Neutrophils Relative %: 66 %
Platelet Count: 201 10*3/uL (ref 150–400)
RBC: 3.82 MIL/uL — ABNORMAL LOW (ref 4.22–5.81)
RDW: 14 % (ref 11.5–15.5)
WBC Count: 5.7 10*3/uL (ref 4.0–10.5)
nRBC: 0 % (ref 0.0–0.2)

## 2023-01-26 LAB — BASIC METABOLIC PANEL - CANCER CENTER ONLY
Anion gap: 9 (ref 5–15)
BUN: 18 mg/dL (ref 8–23)
CO2: 28 mmol/L (ref 22–32)
Calcium: 8.9 mg/dL (ref 8.9–10.3)
Chloride: 99 mmol/L (ref 98–111)
Creatinine: 1.18 mg/dL (ref 0.61–1.24)
GFR, Estimated: >60 mL/min (ref >60)
Glucose, Bld: 105 mg/dL — ABNORMAL HIGH (ref 70–99)
Potassium: 3.5 mmol/L (ref 3.5–5.1)
Sodium: 136 mmol/L (ref 135–145)

## 2023-01-26 LAB — VITAMIN B12: Vitamin B-12: 283 pg/mL (ref 180–914)

## 2023-01-26 MED ORDER — CYANOCOBALAMIN 1000 MCG/ML IJ SOLN
1000.0000 ug | Freq: Once | INTRAMUSCULAR | Status: AC
  Administered 2023-01-26: 1000 ug via INTRAMUSCULAR

## 2023-01-26 NOTE — Progress Notes (Signed)
BP is elevated. Pt took his BP medication this am. Takes hydrocodone for chronic back pain. Pt in a pain clinic. Pt feels fatigued. Has gained weight . Eating well.

## 2023-01-26 NOTE — Progress Notes (Signed)
[facility] CONSULT NOTE  Patient Care Team: [name], MD as PCP - General (Family Medicine) Drema Dallas, DO as Consulting Physician (Neurology) Earna Coder, MD as Consulting Physician (Oncology)  CHIEF COMPLAINTS/PURPOSE OF CONSULTATION: ANEMIA   HEMATOLOGY HISTORY  # ANEMIA [Hb; MCV-platelets- WBC; Iron sat; ferritin;  GFR- CT/US; FEB 2023- EGD [[facility]]/- Evidence of a Roux-en-Y gastrojejunostomy was found. The gastrojejunal anastomosis was characterized by healthy appearing mucosa. The jejunojejunal anastomosis was characterized by healthy appearing mucosa. Previous gastric ulcer has healed. 2019-Colonoscopy   Latest Reference Range & Units 01/05/22 11:13  TIBC 250.0 - 450.0 mcg/dL 161.0 (H)  Saturation Ratios 20.0 - 50.0 % 10.3 (L)  Ferritin 22.0 - 322.0 ng/mL 12.5 (L)  Transferrin 212.0 - 360.0 mg/dL 960.4 (H)  (H): Data is abnormally high (L): Data is abnormally low  # Leucocytosis/weight loss/extreme fatigue-bone marrow biopsy-June 2019-unremarkable.   #Mild macrocytosis without anemia-question copper deficiency/gastric bypass; recommend p.o. copper.   #  SDH [[place]- 2015]; gastric Bypass Surgery    Latest Reference Range & Units 01/05/22 11:13  TIBC 250.0 - 450.0 mcg/dL 540.9 (H)  Saturation Ratios 20.0 - 50.0 % 10.3 (L)  Ferritin 22.0 - 322.0 ng/mL 12.5 (L)  Transferrin 212.0 - 360.0 mg/dL 811.9 (H)  (H): Data is abnormally high (L): Data is abnormally low   No history exists.    Latest Reference Range & Units 01/05/22 11:13  WBC 4.0 - 10.5 K/uL 8.4  RBC 4.22 - 5.81 Mil/uL 4.19 (L)  Hemoglobin 13.0 - 17.0 g/dL 14.7 (L)  HCT 82.9 - 56.2 % 38.3 (L)  MCV 78.0 - 100.0 fl 91.4  MCHC 30.0 - 36.0 g/dL 13.0  RDW 86.5 - 78.4 % 16.7 (H)  Platelets 150.0 - 400.0 K/uL 207.0  (L): Data is abnormally low (H): Data is abnormally high  HISTORY OF PRESENTING ILLNESS: Patient ambulating dependently.  Alone.   [name] 72 y.o.   male pleasant patient with a history of gastric bypass -anemia; chronic back pain s/p multiple surgeries osteoporosis is here for follow-up.  BP is elevated. Pt took his BP medication this am.   Patient continues have chronic joint pains. Takes hydrocodone for chronic back pain. Pt in a pain clinic.   Pt feels fatigued. Has gained weight.  Denies any blood in stools or black or stools.   Review of Systems  Constitutional:  Positive for malaise/fatigue. Negative for chills, diaphoresis, fever and weight loss.  HENT:  Negative for nosebleeds and sore throat.   Eyes:  Negative for double vision.  Respiratory:  Negative for cough, hemoptysis, sputum production, shortness of breath and wheezing.   Cardiovascular:  Negative for chest pain, palpitations, orthopnea and leg swelling.  Gastrointestinal:  Negative for abdominal pain, blood in stool, constipation, diarrhea, heartburn, melena, nausea and vomiting.  Genitourinary:  Negative for dysuria, frequency and urgency.  Musculoskeletal:  Positive for back pain and joint pain.  Skin: Negative.  Negative for itching and rash.  Neurological:  Negative for dizziness, tingling, focal weakness, weakness and headaches.  Endo/Heme/Allergies:  Does not bruise/bleed easily.  Psychiatric/Behavioral:  Negative for depression. The patient is not nervous/anxious and does not have insomnia.      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  removal scheduled  to clear margins.   CHF (congestive heart failure) (HCC)    no longer after weight loss   Chronic kidney disease    cysts   Degenerative disc disease    Depression    Diabetes mellitus    no longer diabetic-or on meds   DJD (degenerative joint disease)    Dysplastic nevus 02/04/2016   R upper back paraspinal - severe   Family  history of adverse reaction to anesthesia    sons wake up combative   Foot fracture, left 01/2022   Gastric ulcer    GERD (gastroesophageal reflux disease)    H/O hiatal hernia    Headache(784.0)    sinus   Heart murmur    History of fusion of thoracic spine 12/28/2021   History of gastric bypass    Hx of congestive heart failure    Hyperlipidemia    Hypertension    Iron deficiency anemia 02/28/2015   Lumbar scoliosis    Opiate abuse, continuous (HCC) 06/20/2015   S/P laparoscopic cholecystectomy 02/18/2020   Sacral fracture, closed (HCC)    Seizures (HCC)    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, after GI bleed   Sleep apnea    hx not now since wt loss   Stones in the urinary tract    Syncope and collapse     SURGICAL HISTORY: Past Surgical History:  Procedure Laterality Date   ANTERIOR CERVICAL DECOMP/DISCECTOMY FUSION  01/26/2012   Procedure: ANTERIOR CERVICAL DECOMPRESSION/DISCECTOMY FUSION 3 LEVELS;  Surgeon: [name], MD;  Location: [facility];  Service: Neurosurgery;  Laterality: N/A;  Cervical three-four Cervical four-five Cervical five-six Cervical six-seven , Anterior cervical decompression/diskectomy, fusion, plate   ANTERIOR LAT LUMBAR FUSION Right 04/25/2018   Procedure: Right Lumbar two-three Lumbar three-four Lumbar four-five anterior  lateral interbody fusion  with posterior percutaneous pedicle screws;  Surgeon: [name], MD;  Location: [facility] OR;  Service: Neurosurgery;  Laterality: Right;   APPENDECTOMY     CARDIAC CATHETERIZATION     [facility], normal   CARDIAC CATHETERIZATION     [place]   CHOLECYSTECTOMY     COLONOSCOPY WITH PROPOFOL N/A 12/27/2017   Procedure: COLONOSCOPY WITH PROPOFOL;  Surgeon: Toledo, [name], MD;  Location: [facility];  Service: Gastroenterology;  Laterality: N/A;   ESOPHAGOGASTRODUODENOSCOPY (EGD) WITH PROPOFOL N/A 12/27/2017   Procedure: ESOPHAGOGASTRODUODENOSCOPY (EGD) WITH PROPOFOL;  Surgeon: Toledo,  [name], MD;  Location: [facility] ENDOSCOPY;  Service: Gastroenterology;  Laterality: N/A;   ESOPHAGOGASTRODUODENOSCOPY (EGD) WITH PROPOFOL N/A 06/19/2021   Procedure: ESOPHAGOGASTRODUODENOSCOPY (EGD) WITH PROPOFOL;  Surgeon: [name], MD;  Location: [facility] ENDOSCOPY;  Service: Gastroenterology;  Laterality: N/A;   ESOPHAGOGASTRODUODENOSCOPY (EGD) WITH PROPOFOL N/A 09/25/2021   Procedure: ESOPHAGOGASTRODUODENOSCOPY (EGD) WITH PROPOFOL;  Surgeon: [name], MD;  Location: [facility] ENDOSCOPY;  Service: Endoscopy;  Laterality: N/A;   GASTRIC BYPASS  08/09/2008   [employer]   HARDWARE REMOVAL N/A 03/08/2022   Procedure: Removal of bilateral Thoracic ten pedicle screws;  Surgeon: [name], MD;  Location: [facility] OR;  Service: Neurosurgery;  Laterality: N/A;   HERNIA REPAIR  08/09/1998   hiatal   JOINT REPLACEMENT     KNEE ARTHROSCOPY     bilateral, left x 2   LAMINECTOMY WITH POSTERIOR LATERAL ARTHRODESIS LEVEL 4 N/A 01/14/2020   Procedure: Decompressive Laminectomy Lumbar One with pedicle screw fixation from Thoracic Ten to Lumbar Two;  Surgeon: [name], MD;  Location: [facility] OR;  Service: Neurosurgery;  Laterality: N/A;  Decompressive Laminectomy  Lumbar One with pedicle screw fixation from Thoracic Ten to Lumbar Two   LUMBAR PERCUTANEOUS PEDICLE SCREW 3 LEVEL N/A 04/25/2018   Procedure: LUMBAR PERCUTANEOUS PEDICLE SCREW 3 LEVEL;  Surgeon: [name], MD;  Location: [facility] OR;  Service: Neurosurgery;  Laterality: N/A;   NASAL SINUS SURGERY     x5   OSTEOTOMY  08/10/1999   left   ROUX-EN-Y GASTRIC BYPASS     THORACIC LAMINECTOMY FOR EPIDURAL ABSCESS N/A 03/28/2022   Procedure: THORACIC WOUND WASHOUT;  Surgeon: [name], MD;  Location: [facility] OR;  Service: Neurosurgery;  Laterality: N/A;   TONSILLECTOMY     TOTAL KNEE ARTHROPLASTY Left 05/09/2014   Dr. [name]   UVULOPALATOPLASTY  08/09/2009    SOCIAL HISTORY: Social History   Socioeconomic History   Marital status: Married     Spouse name: Not on file   Number of children: 2   Years of education: Not on file   Highest education level: Not on file  Occupational History   Not on file  Tobacco Use   Smoking status: Never   Smokeless tobacco: Never  Vaping Use   Vaping Use: Never used  Substance and Sexual Activity   Alcohol use: Yes    Alcohol/week: 21.0 standard drinks of alcohol    Types: 21 Glasses of wine per week    Comment: occassionally   Drug use: No   Sexual activity: Yes    Partners: Female  Other Topics Concern   Not on file  Social History Narrative   Lives in [place] with wife, [place]. 2 sons, Roderic Ovens, Caryn Bee 30.. 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      Diet - regular diet, limited quantities after gastric bypass      Exercise - no regular, limited by arthritis in knees, occasional water aerobics   Right handed   One story house   Social Determinants of Health   Financial Resource Strain: Low Risk  (07/06/2022)   Overall Financial Resource Strain (CARDIA)    Difficulty of Paying Living Expenses: Not hard at all  Food Insecurity: No Food Insecurity (11/26/2022)   Hunger Vital Sign    Worried About Running Out of Food in the Last Year: Never true    Ran Out of Food in the Last Year: Never true  Transportation Needs: No Transportation Needs (11/26/2022)   PRAPARE - Administrator, Civil Service (Medical): No    Lack of Transportation (Non-Medical): No  Physical Activity: Unknown (06/22/2021)   Exercise Vital Sign    Days of Exercise per Week: 0 days    Minutes of Exercise per Session: Not on file  Stress: No Stress Concern Present (07/06/2022)   [employer] of Occupational Health - Occupational Stress Questionnaire    Feeling of Stress : Not at all  Social Connections: Unknown (07/06/2022)   Social Connection and Isolation Panel [NHANES]    Frequency of Communication with Friends and Family: Not on file     Frequency of Social Gatherings with Friends and Family: More than three times a week    Attends Religious Services: Not on file    Active Member of Clubs or Organizations: Not on file    Attends Banker Meetings: Not on file    Marital Status: Married  Intimate Partner Violence: Unknown (11/19/2022)   Humiliation, Afraid, Rape, and Kick questionnaire    Fear of Current or Ex-Partner: No  Emotionally Abused: No    Physically Abused: No    Sexually Abused: Patient declined    FAMILY HISTORY: Family History  Problem Relation Age of Onset   Dementia Mother 15   Osteoporosis Mother    Heart disease Father 57   Diabetes Father    Lymphoma Sister        lymphoma, stage 4   Ovarian cancer Sister 46       Ovarian   Lupus Sister    Prostate cancer Maternal Uncle    Skin cancer Maternal Uncle    Tongue cancer Maternal Uncle        uncle died of heart attack   Alcoholism Maternal Uncle    Lung cancer Paternal Aunt        pat aunts x 4 died of lung cancer   Lung cancer Paternal Aunt    Lung cancer Paternal Aunt    Lung cancer Paternal Aunt    Mesothelioma Cousin        paternal cousin    ALLERGIES:  is allergic to altace [ramipril], ace inhibitors, levaquin [levofloxacin in d5w], lyrica [pregabalin], metformin, and trulicity [dulaglutide].  MEDICATIONS:  Current Outpatient Medications  Medication Sig Dispense Refill   acetaminophen (TYLENOL) 325 MG tablet Take 650 mg by mouth every 6 (six) hours as needed for moderate pain.     amLODipine (NORVASC) 5 MG tablet Take 1 tablet (5 mg total) by mouth daily. 30 tablet 0   Calcium Carbonate-Vitamin D (CALCIUM 600/VITAMIN D PO) Take 1 tablet by mouth daily.     Cholecalciferol 50 MCG (2000 UT) TABS Take 2,000 Units by mouth daily.     cyclobenzaprine (FLEXERIL) 5 MG tablet TAKE 1 TABLET BY MOUTH EVERY 8 HOURS AS NEEDED FOR SPASMS. (Patient taking differently: Take 5 mg by mouth as needed for muscle spasms.) 30 tablet 1    esomeprazole (NEXIUM) 40 MG capsule Take 1 capsule (40 mg total) by mouth daily. 30 capsule 0   ezetimibe (ZETIA) 10 MG tablet Take 1 tablet (10 mg total) by mouth daily. 90 tablet 3   furosemide (LASIX) 20 MG tablet Take 1 tablet (20 mg total) by mouth daily. 30 tablet 0   gabapentin (NEURONTIN) 300 MG capsule Take 1 capsule (300 mg total) by mouth in the morning AND 2 capsules (600 mg total) daily with lunch AND 1 capsule (300 mg total) at bedtime. 120 capsule 0   HYDROcodone-acetaminophen (NORCO) 7.5-325 MG tablet Take 1 tablet by mouth 4 (four) times daily as needed.     metoprolol succinate (TOPROL-XL) 50 MG 24 hr tablet Take 1 tablet (50 mg total) by mouth daily. Take with or immediately following a meal. 30 tablet 0   Multiple Vitamin (MULTIVITAMIN) capsule Take 1 capsule by mouth daily. With copper and zinc (Patient taking differently: Take 1 capsule by mouth daily.)     primidone (MYSOLINE) 50 MG tablet Take 1 tablet (50 mg total) by mouth at bedtime. 30 tablet 5   rosuvastatin (CRESTOR) 40 MG tablet Take 1 tablet (40 mg total) by mouth daily. 30 tablet 0   sucralfate (CARAFATE) 1 g tablet Take 1 tablet (1 g total) by mouth 4 (four) times daily. 360 tablet 1   tamsulosin (FLOMAX) 0.4 MG CAPS capsule Take 2 capsules (0.8 mg total) by mouth daily. 180 capsule 1   TRADJENTA 5 MG TABS tablet TAKE 1 TABLET BY MOUTH DAILY (Patient taking differently: Take 5 mg by mouth daily.) 90 tablet 1   venlafaxine XR (  EFFEXOR-XR) 75 MG 24 hr capsule TAKE ONE CAPSULE EVERY MORNING WITH BREAKFAST (Patient taking differently: Take 75 mg by mouth daily.) 90 capsule 1   No current facility-administered medications for this visit.     Marland Kitchen  PHYSICAL EXAMINATION:   Vitals:   01/26/23 1522 01/26/23 1525  BP:  (!) 139/95  Pulse: 70 70  Resp: 18 18  SpO2: 100%     Filed Weights   01/26/23 1522 01/26/23 1525  Weight: 180 lb (81.6 kg) 180 lb (81.6 kg)     Physical Exam Vitals and nursing note reviewed.   HENT:     Head: Normocephalic and atraumatic.     Mouth/Throat:     Pharynx: Oropharynx is clear.  Eyes:     Extraocular Movements: Extraocular movements intact.     Pupils: Pupils are equal, round, and reactive to light.  Cardiovascular:     Rate and Rhythm: Normal rate and regular rhythm.  Pulmonary:     Comments: Decreased breath sounds bilaterally.  Abdominal:     Palpations: Abdomen is soft.  Musculoskeletal:        General: Normal range of motion.     Cervical back: Normal range of motion.  Skin:    General: Skin is warm.  Neurological:     General: No focal deficit present.     Mental Status: He is alert and oriented to person, place, and time.  Psychiatric:        Behavior: Behavior normal.        Judgment: Judgment normal.      LABORATORY DATA:  I have reviewed the data as listed Lab Results  Component Value Date   WBC 5.7 01/26/2023   HGB 13.2 01/26/2023   HCT 40.2 01/26/2023   MCV 105.2 (H) 01/26/2023   PLT 201 01/26/2023   Recent Labs    11/20/22 0230 11/21/22 0047 12/01/22 1120 12/22/22 1559 01/26/23 1449  NA 141 135 140 143 136  K 3.4* 3.5 4.1 3.8 3.5  CL 106 103 100 104 99  CO2 24 23 28 30 28   GLUCOSE 159* 191* 154* 137* 105*  BUN 22 19 15 20 18   CREATININE 1.16 1.08 1.08 1.37 1.18  CALCIUM 8.2* 7.9* 9.7 8.9 8.9  GFRNONAA >60 >60  --   --  >60  PROT 5.3* 5.1* 6.8 5.9*  --   ALBUMIN 2.5* 2.3* 4.0 3.6  --   AST 33 22 42* 24  --   ALT 45* 33 41 53  --   ALKPHOS 34* 35* 56 44  --   BILITOT 0.5 0.3 0.3 0.2  --      No results found.  ASSESSMENT & PLAN:   Iron deficiency # Iron deficiency: sec to gastric bypass. FEB 2024-  Iron sat- 38%; ferritin-180. [PCP; Hb12]-likely secondary to malabsorption. Question low-grade MDS-but bone marrow biopsy would be recommended to confirm.  As long patient does not have worsening anemia-I would recommend continued surveillance without a bone marrow.    # Currently on maintenance iron infusion/ venofer.   Today hemoglobin is 13.  Stable.  Hold Venofer today.  # B 12 def [NOV 2023]- 233- s/p B12 shot- nov 2023; proceed with B12 shot every 3-4 months.  B12 shot today.  # Chronic back pain/osteoporosis [Dr.Poole;neurosurg]-status post surgery clinically stable.vit D NOV 2023- 43.   # CKD stage III [Dr.Singh]- stable.   # DISPOSITION:  # HOLD venofer today; ok B12 shot injection # follow up in 4 months- MD;  labs- cbc/bmp; b12 levels-possible IV venofer; B12 injection-Dr.B    All questions were answered. The patient knows to call the clinic with any problems, questions or concerns.    Earna Coder, MD 01/26/2023 3:56 PM

## 2023-01-26 NOTE — Assessment & Plan Note (Addendum)
#   Iron deficiency: sec to gastric bypass. FEB 2024-  Iron sat- 38%; ferritin-180. [PCP; Hb12]-likely secondary to malabsorption. Question low-grade MDS-but bone marrow biopsy would be recommended to confirm.  As long patient does not have worsening anemia-I would recommend continued surveillance without a bone marrow.    # Currently on maintenance iron infusion/ venofer.  Today hemoglobin is 13.  Stable.  Hold Venofer today.  # B 12 def [NOV 2023]- 233- s/p B12 shot- nov 2023; proceed with B12 shot every 3-4 months.  B12 shot today.  # Chronic back pain/osteoporosis [Dr.Poole;neurosurg]-status post surgery clinically stable.vit D NOV 2023- 43.   # CKD stage III [Dr.Singh]- stable.   # DISPOSITION:  # HOLD venofer today; ok B12 shot injection # follow up in 4 months- MD; labs- cbc/bmp; b12 levels-possible IV venofer; B12 injection-Dr.B

## 2023-01-31 ENCOUNTER — Ambulatory Visit: Payer: Medicare PPO | Admitting: Family Medicine

## 2023-02-01 ENCOUNTER — Ambulatory Visit (INDEPENDENT_AMBULATORY_CARE_PROVIDER_SITE_OTHER): Payer: Medicare PPO

## 2023-02-01 ENCOUNTER — Encounter: Payer: Self-pay | Admitting: Family Medicine

## 2023-02-01 ENCOUNTER — Ambulatory Visit: Payer: Medicare PPO | Admitting: Family Medicine

## 2023-02-01 VITALS — BP 116/72 | HR 73 | Temp 98.4°F | Ht 66.0 in | Wt 180.8 lb

## 2023-02-01 DIAGNOSIS — J309 Allergic rhinitis, unspecified: Secondary | ICD-10-CM

## 2023-02-01 DIAGNOSIS — J984 Other disorders of lung: Secondary | ICD-10-CM | POA: Diagnosis not present

## 2023-02-01 DIAGNOSIS — R233 Spontaneous ecchymoses: Secondary | ICD-10-CM

## 2023-02-01 DIAGNOSIS — R051 Acute cough: Secondary | ICD-10-CM

## 2023-02-01 DIAGNOSIS — I10 Essential (primary) hypertension: Secondary | ICD-10-CM

## 2023-02-01 DIAGNOSIS — R600 Localized edema: Secondary | ICD-10-CM | POA: Diagnosis not present

## 2023-02-01 DIAGNOSIS — E119 Type 2 diabetes mellitus without complications: Secondary | ICD-10-CM | POA: Diagnosis not present

## 2023-02-01 MED ORDER — FUROSEMIDE 20 MG PO TABS: 20.0000 mg | ORAL_TABLET | Freq: Every day | ORAL | 1 refills | Status: DC

## 2023-02-01 MED ORDER — METOPROLOL SUCCINATE ER 50 MG PO TB24: 50.0000 mg | ORAL_TABLET | Freq: Every day | ORAL | 1 refills | Status: DC

## 2023-02-01 MED ORDER — AMLODIPINE BESYLATE 5 MG PO TABS: 5.0000 mg | ORAL_TABLET | Freq: Every day | ORAL | 1 refills | Status: DC

## 2023-02-01 MED ORDER — ESOMEPRAZOLE MAGNESIUM 40 MG PO CPDR: 40.0000 mg | DELAYED_RELEASE_CAPSULE | Freq: Every day | ORAL | 1 refills | Status: DC

## 2023-02-01 MED ORDER — LINAGLIPTIN 5 MG PO TABS: 5.0000 mg | ORAL_TABLET | Freq: Every day | ORAL | 1 refills | Status: DC

## 2023-02-01 MED ORDER — ROSUVASTATIN CALCIUM 40 MG PO TABS: 40.0000 mg | ORAL_TABLET | Freq: Every day | ORAL | 1 refills | Status: DC

## 2023-02-01 NOTE — Assessment & Plan Note (Signed)
Chronic issue.  Discussed this is likely related to thinning of the skin with his age.  Recent CBC did not reveal an obvious cause for this.  Patient will monitor.

## 2023-02-01 NOTE — Assessment & Plan Note (Signed)
Chronic issue.  Patient can try over-the-counter Claritin or Flonase if he desires.

## 2023-02-01 NOTE — Assessment & Plan Note (Signed)
Patient has recovered at this time.  We will recheck a chest x-ray given prior x-ray findings.  Discussed if the abnormality was still present we would need to do a CT scan of his chest.

## 2023-02-01 NOTE — Progress Notes (Signed)
Marikay Alar, MD Phone: [phone]  Justin Patel is a 72 y.o. male who presents today for f/u.  Respiratory illness: Patient presents for follow-up of respiratory illness.  He notes no cough, chest congestion, or shortness of breath.  Allergic rhinitis: Patient notes constant postnasal drip that has been chronic since he was much younger.  He notes having some congestion as well related to this.  He notes at times he will take Claritin or use nose sprays and those are beneficial.  He notes there are some medicines he tried in the past that she does blood pressure up.  Bruising: This is a chronic issue.  Notes it is just his arms and legs.  Recent CBC with acceptable platelets.  Social History   Tobacco Use  Smoking Status Never  Smokeless Tobacco Never    Current Outpatient Medications on File Prior to Visit  Medication Sig Dispense Refill   acetaminophen (TYLENOL) 325 MG tablet Take 650 mg by mouth every 6 (six) hours as needed for moderate pain.     Calcium Carbonate-Vitamin D (CALCIUM 600/VITAMIN D PO) Take 1 tablet by mouth daily.     Cholecalciferol 50 MCG (2000 UT) TABS Take 2,000 Units by mouth daily.     cyclobenzaprine (FLEXERIL) 5 MG tablet TAKE 1 TABLET BY MOUTH EVERY 8 HOURS AS NEEDED FOR SPASMS. (Patient taking differently: Take 5 mg by mouth as needed for muscle spasms.) 30 tablet 1   ezetimibe (ZETIA) 10 MG tablet Take 1 tablet (10 mg total) by mouth daily. 90 tablet 3   gabapentin (NEURONTIN) 300 MG capsule Take 1 capsule (300 mg total) by mouth in the morning AND 2 capsules (600 mg total) daily with lunch AND 1 capsule (300 mg total) at bedtime. 120 capsule 0   HYDROcodone-acetaminophen (NORCO) 7.5-325 MG tablet Take 1 tablet by mouth 4 (four) times daily as needed.     Multiple Vitamin (MULTIVITAMIN) capsule Take 1 capsule by mouth daily. With copper and zinc (Patient taking differently: Take 1 capsule by mouth daily.)     primidone (MYSOLINE) 50 MG tablet  Take 1 tablet (50 mg total) by mouth at bedtime. 30 tablet 5   sucralfate (CARAFATE) 1 g tablet Take 1 tablet (1 g total) by mouth 4 (four) times daily. 360 tablet 1   tamsulosin (FLOMAX) 0.4 MG CAPS capsule Take 2 capsules (0.8 mg total) by mouth daily. 180 capsule 1   venlafaxine XR (EFFEXOR-XR) 75 MG 24 hr capsule TAKE ONE CAPSULE EVERY MORNING WITH BREAKFAST (Patient taking differently: Take 75 mg by mouth daily.) 90 capsule 1   No current facility-administered medications on file prior to visit.     ROS see history of present illness  Objective  Physical Exam Vitals:   02/01/23 0901  BP: 116/72  Pulse: 73  Temp: 98.4 F (36.9 C)  SpO2: 98%    BP Readings from Last 3 Encounters:  02/01/23 116/72  01/26/23 (!) 139/95  12/22/22 120/74   Wt Readings from Last 3 Encounters:  02/01/23 180 lb 12.8 oz (82 kg)  01/26/23 180 lb (81.6 kg)  12/22/22 181 lb 9.6 oz (82.4 kg)    Physical Exam Constitutional:      General: He is not in acute distress.    Appearance: He is not diaphoretic.  Cardiovascular:     Rate and Rhythm: Normal rate and regular rhythm.     Heart sounds: Normal heart sounds.  Pulmonary:     Effort: Pulmonary effort is normal.  Breath sounds: Normal breath sounds.  Skin:    General: Skin is warm and dry.     Comments: Scattered bruises on the patient's arms  Neurological:     Mental Status: He is alert.      Assessment/Plan: Please see individual problem list.  Acute cough Assessment & Plan: Patient has recovered at this time.  We will recheck a chest x-ray given prior x-ray findings.  Discussed if the abnormality was still present we would need to do a CT scan of his chest.  Orders: -     DG Chest 2 View; Future  Bilateral edema of lower extremity -     Furosemide; Take 1 tablet (20 mg total) by mouth daily.  Dispense: 90 tablet; Refill: 1  Essential hypertension, benign -     Metoprolol Succinate ER; Take 1 tablet (50 mg total) by mouth  daily. Take with or immediately following a meal.  Dispense: 90 tablet; Refill: 1  Type 2 diabetes mellitus without complication, without long-term current use of insulin (HCC) -     Rosuvastatin Calcium; Take 1 tablet (40 mg total) by mouth daily.  Dispense: 90 tablet; Refill: 1  Easy bruising Assessment & Plan: Chronic issue.  Discussed this is likely related to thinning of the skin with his age.  Recent CBC did not reveal an obvious cause for this.  Patient will monitor.   Allergic rhinitis, unspecified seasonality, unspecified trigger Assessment & Plan: Chronic issue.  Patient can try over-the-counter Claritin or Flonase if he desires.   Other orders -     amLODIPine Besylate; Take 1 tablet (5 mg total) by mouth daily.  Dispense: 90 tablet; Refill: 1 -     Esomeprazole Magnesium; Take 1 capsule (40 mg total) by mouth daily.  Dispense: 90 capsule; Refill: 1 -     linaGLIPtin; Take 1 tablet (5 mg total) by mouth daily.  Dispense: 90 tablet; Refill: 1     Return in about 3 months (around 05/04/2023).   Marikay Alar, MD [facility] [place]

## 2023-02-02 ENCOUNTER — Ambulatory Visit: Payer: Medicare PPO | Admitting: Internal Medicine

## 2023-02-02 ENCOUNTER — Ambulatory Visit: Payer: Medicare PPO

## 2023-02-02 ENCOUNTER — Other Ambulatory Visit: Payer: Medicare PPO

## 2023-02-03 DIAGNOSIS — M5414 Radiculopathy, thoracic region: Secondary | ICD-10-CM | POA: Diagnosis not present

## 2023-02-03 DIAGNOSIS — M5416 Radiculopathy, lumbar region: Secondary | ICD-10-CM | POA: Diagnosis not present

## 2023-02-03 DIAGNOSIS — M4326 Fusion of spine, lumbar region: Secondary | ICD-10-CM | POA: Diagnosis not present

## 2023-02-03 DIAGNOSIS — F1120 Opioid dependence, uncomplicated: Secondary | ICD-10-CM | POA: Diagnosis not present

## 2023-02-07 ENCOUNTER — Other Ambulatory Visit: Payer: Self-pay

## 2023-02-07 ENCOUNTER — Emergency Department
Admission: EM | Admit: 2023-02-07 | Discharge: 2023-02-08 | Disposition: A | Discharge status: Home / Self Care | Payer: Medicare PPO | Attending: Emergency Medicine | Admitting: Emergency Medicine

## 2023-02-07 DIAGNOSIS — G8929 Other chronic pain: Secondary | ICD-10-CM

## 2023-02-07 DIAGNOSIS — M549 Dorsalgia, unspecified: Secondary | ICD-10-CM | POA: Diagnosis not present

## 2023-02-07 DIAGNOSIS — Y904 Blood alcohol level of 80-99 mg/100 ml: Secondary | ICD-10-CM | POA: Insufficient documentation

## 2023-02-07 DIAGNOSIS — R780 Finding of alcohol in blood: Secondary | ICD-10-CM | POA: Diagnosis not present

## 2023-02-07 DIAGNOSIS — N179 Acute kidney failure, unspecified: Secondary | ICD-10-CM

## 2023-02-07 DIAGNOSIS — R000 Tachycardia, unspecified: Secondary | ICD-10-CM | POA: Diagnosis not present

## 2023-02-07 DIAGNOSIS — E876 Hypokalemia: Secondary | ICD-10-CM | POA: Diagnosis not present

## 2023-02-07 DIAGNOSIS — R112 Nausea with vomiting, unspecified: Secondary | ICD-10-CM | POA: Insufficient documentation

## 2023-02-07 DIAGNOSIS — M545 Low back pain, unspecified: Secondary | ICD-10-CM | POA: Diagnosis not present

## 2023-02-07 LAB — URINALYSIS, ROUTINE W REFLEX MICROSCOPIC
Bacteria, UA: NONE SEEN
Bilirubin Urine: NEGATIVE
Glucose, UA: NEGATIVE mg/dL
Hgb urine dipstick: NEGATIVE
Ketones, ur: NEGATIVE mg/dL
Leukocytes,Ua: NEGATIVE
Nitrite: NEGATIVE
Protein, ur: 100 mg/dL — AB
Specific Gravity, Urine: 1.023 (ref 1.005–1.030)
pH: 5 (ref 5.0–8.0)

## 2023-02-07 LAB — BASIC METABOLIC PANEL
Anion gap: 15 (ref 5–15)
BUN: 16 mg/dL (ref 8–23)
CO2: 17 mmol/L — ABNORMAL LOW (ref 22–32)
Calcium: 9.2 mg/dL (ref 8.9–10.3)
Chloride: 101 mmol/L (ref 98–111)
Creatinine, Ser: 1.37 mg/dL — ABNORMAL HIGH (ref 0.61–1.24)
GFR, Estimated: 55 mL/min — ABNORMAL LOW (ref >60)
Glucose, Bld: 159 mg/dL — ABNORMAL HIGH (ref 70–99)
Potassium: 2.9 mmol/L — ABNORMAL LOW (ref 3.5–5.1)
Sodium: 133 mmol/L — ABNORMAL LOW (ref 135–145)

## 2023-02-07 LAB — CBC
HCT: 43.9 % (ref 39.0–52.0)
Hemoglobin: 15 g/dL (ref 13.0–17.0)
MCH: 35.3 pg — ABNORMAL HIGH (ref 26.0–34.0)
MCHC: 34.2 g/dL (ref 30.0–36.0)
MCV: 103.3 fL — ABNORMAL HIGH (ref 80.0–100.0)
Platelets: 187 10*3/uL (ref 150–400)
RBC: 4.25 MIL/uL (ref 4.22–5.81)
RDW: 14.1 % (ref 11.5–15.5)
WBC: 8.7 10*3/uL (ref 4.0–10.5)
nRBC: 0 % (ref 0.0–0.2)

## 2023-02-07 LAB — TROPONIN I (HIGH SENSITIVITY): Troponin I (High Sensitivity): 17 ng/L (ref <18)

## 2023-02-07 LAB — CBG MONITORING, ED: Glucose-Capillary: 175 mg/dL — ABNORMAL HIGH (ref 70–99)

## 2023-02-07 LAB — ETHANOL: Alcohol, Ethyl (B): 94 mg/dL — ABNORMAL HIGH (ref <10)

## 2023-02-07 MED ORDER — SODIUM CHLORIDE 0.9 % IV BOLUS
1000.0000 mL | Freq: Once | INTRAVENOUS | Status: AC
  Administered 2023-02-07: 1000 mL via INTRAVENOUS

## 2023-02-07 MED ORDER — POTASSIUM CHLORIDE CRYS ER 20 MEQ PO TBCR
40.0000 meq | EXTENDED_RELEASE_TABLET | Freq: Once | ORAL | Status: AC
  Administered 2023-02-07: 40 meq via ORAL
  Filled 2023-02-07: qty 2

## 2023-02-07 MED ORDER — POTASSIUM CHLORIDE 10 MEQ/100ML IV SOLN
10.0000 meq | Freq: Once | INTRAVENOUS | Status: AC
  Administered 2023-02-07: 10 meq via INTRAVENOUS
  Filled 2023-02-07: qty 100

## 2023-02-07 MED ORDER — ONDANSETRON HCL 4 MG/2ML IJ SOLN
4.0000 mg | INTRAMUSCULAR | Status: AC
  Administered 2023-02-07: 4 mg via INTRAVENOUS
  Filled 2023-02-07: qty 2

## 2023-02-07 MED ORDER — OXYCODONE-ACETAMINOPHEN 5-325 MG PO TABS
2.0000 | ORAL_TABLET | Freq: Once | ORAL | Status: AC
  Administered 2023-02-07: 2 via ORAL
  Filled 2023-02-07: qty 2

## 2023-02-07 NOTE — ED Triage Notes (Signed)
Pt c/o lower back pain and dizziness. Back pain is unchanged from the chronic pain he usually feels. No injuries/trauma. Hx of chronic back pain with multiple surgeries. Is currently out of his pain medication until refill on Friday. Endorses ETOH intake today, 5 Buzzballs. Dizziness started after alcohol intake today approx 3 hrs ago. Sts dizziness in triage while sitting in chair. No numbness/decreased sensation lower legs. No urinary s/s. No syncopal episodes.

## 2023-02-08 MED ORDER — ONDANSETRON 4 MG PO TBDP: ORAL_TABLET | ORAL | 0 refills | Status: DC

## 2023-02-08 MED ORDER — POTASSIUM CHLORIDE CRYS ER 20 MEQ PO TBCR: 20.0000 meq | EXTENDED_RELEASE_TABLET | Freq: Every day | ORAL | 0 refills | Status: DC

## 2023-02-08 NOTE — ED Provider Notes (Signed)
[facility] Provider Note    Event Date/Time   First MD Initiated Contact with Patient 02/07/23 2303     (approximate)   History   Dizziness and Back Pain   HPI Justin Patel is a 72 y.o. male whose medical history includes chronic back problems resulting in chronic pain syndrome and he has a patient at the pain clinic.  He presents with complaints of nausea, vomiting, and back pain for the last several days after running out of his monthly pain medication early.  He said that he saw his pain provider last week who told them that she would refill his medication but not until it is time to do so which is in about 5 days.  The patient reports that when he cannot get his pain medicine, which has happened before, he drinks extra alcohol to try and not miss pain and he admits to having multiple drinks tonight.  The patient said he has been sick to his stomach and vomiting and unable to keep anything down for couple of days.  His back pain is severe because he has not had his medicine but he has not had a recent fall or other acute issue.  He denies fever, chest pain, shortness of breath, abdominal pain, and any new extremity pain.  No new numbness or weakness in his extremities.  No recent trauma.     Physical Exam   Triage Vital Signs: ED Triage Vitals  Enc Vitals Group     BP 02/07/23 2054 134/85     Pulse Rate 02/07/23 2054 94     Resp 02/07/23 2054 18     Temp 02/07/23 2054 98.7 F (37.1 C)     Temp Source 02/07/23 2054 Oral     SpO2 02/07/23 2054 98 %     Weight 02/07/23 2102 81.6 kg (180 lb)     Height 02/07/23 2102 1.676 m (5\' 6" )     Head Circumference --      Peak Flow --      Pain Score 02/07/23 2102 8     Pain Loc --      Pain Edu? --      Excl. in GC? --     Most recent vital signs: Vitals:   02/08/23 0130 02/08/23 0200  BP: (!) 149/96 (!) 158/96  Pulse: 86 88  Resp: (!) 22 15  Temp:  98.4 F (36.9 C)  SpO2: 96% 94%     General: Awake, no obvious distress. CV:  Good peripheral perfusion.  Regular rate and rhythm. Resp:  Normal effort. Speaking easily and comfortably, no accessory muscle usage nor intercostal retractions.   Abd:  No distention.  No tenderness to palpation. Other:  No obvious musculoskeletal abnormalities.  Moving all 4 extremities without difficulty.  Normal, calm, quiet mood and affect.  Appropriate interactions with me and staff.   ED Results / Procedures / Treatments   Labs (all labs ordered are listed, but only abnormal results are displayed) Labs Reviewed  BASIC METABOLIC PANEL - Abnormal; Notable for the following components:      Result Value   Sodium 133 (*)    Potassium 2.9 (*)    CO2 17 (*)    Glucose, Bld 159 (*)    Creatinine, Ser 1.37 (*)    GFR, Estimated 55 (*)    All other components within normal limits  CBC - Abnormal; Notable for the following components:   MCV 103.3 (*)  MCH 35.3 (*)    All other components within normal limits  URINALYSIS, ROUTINE W REFLEX MICROSCOPIC - Abnormal; Notable for the following components:   Color, Urine YELLOW (*)    APPearance CLEAR (*)    Protein, ur 100 (*)    All other components within normal limits  ETHANOL - Abnormal; Notable for the following components:   Alcohol, Ethyl (B) 94 (*)    All other components within normal limits  CBG MONITORING, ED - Abnormal; Notable for the following components:   Glucose-Capillary 175 (*)    All other components within normal limits  TROPONIN I (HIGH SENSITIVITY)     EKG  ED ECG REPORT I, Loleta Rose, the attending physician, personally viewed and interpreted this ECG.  Date: 02/07/2023 EKG Time: 21: 19 Rate: 102 Rhythm: Sinus tachycardia QRS Axis: normal Intervals: normal ST/T Wave abnormalities: Non-specific ST segment / T-wave changes, but no clear evidence of acute ischemia. Narrative Interpretation: no definitive evidence of acute ischemia; does not meet STEMI  criteria.    RADIOLOGY None   PROCEDURES:  Critical Care performed: No  .1-3 Lead EKG Interpretation  Performed by: Loleta Rose, MD Authorized by: Loleta Rose, MD     Interpretation: normal     ECG rate:  89   ECG rate assessment: normal     Rhythm: sinus rhythm     Ectopy: none     Conduction: normal       IMPRESSION / MDM / ASSESSMENT AND PLAN / ED COURSE  I reviewed the triage vital signs and the nursing notes.                              Differential diagnosis includes, but is not limited to, acute on chronic pain, narcotic withdrawal, gastritis.  Patient's presentation is most consistent with severe exacerbation of chronic illness.  Labs/studies ordered: Ethanol level, BMP, CBC, high-sensitivity troponin, urinalysis  Interventions/Medications given:  Medications  oxyCODONE-acetaminophen (PERCOCET/ROXICET) 5-325 MG per tablet 2 tablet (2 tablets Oral Given 02/07/23 2357)  ondansetron (ZOFRAN) injection 4 mg (4 mg Intravenous Given 02/07/23 2355)  potassium chloride SA (KLOR-CON M) CR tablet 40 mEq (40 mEq Oral Given 02/07/23 2357)  potassium chloride 10 mEq in 100 mL IVPB (0 mEq Intravenous Stopped 02/08/23 0056)  sodium chloride 0.9 % bolus 1,000 mL (0 mLs Intravenous Stopped 02/08/23 0056)    (Note:  hospital course my include additional interventions and/or labs/studies not listed above.)   Patient's symptoms most consistent with a combination of alcohol and/or alcohol gastritis as well as possible narcotic withdrawal.  I verified in the medical record and in the [place] controlled substance database that the patient goes to pain clinic and he should still have his outpatient opioids available to him through the end of the week.  I gave him a dose of Percocet x 2 assuming that he would benefit from that given his probable withdrawal symptoms.  His labs are notable for an ethanol level of 94, mild hypokalemia, and mild AKI, both likely is the result of  decreased oral intake and vomiting.  I ordered 1 L LR IV bolus as well as oral and IV potassium as documented above.  Zofran was ordered for his nausea.  The patient ask if he could have some pain medicine by IV but I explained that giving him a dose of his regular medication is appropriate but we will not treat chronic pain and I  am not able to prescribe additional pain medication for him.  He said that he understood.  I will evaluate after he has had his medications and treatment.  The patient is on the cardiac monitor to evaluate for evidence of arrhythmia and/or significant heart rate changes.   Clinical Course as of 02/08/23 1037  Tue Feb 08, 2023  0229 The patient states that he feels much better.  He is watching TV in no distress and has tolerated oral intake.  Heart rate is down to the 80s.  He asked if he could have some pain medicine to take home with him and I explained that we do not dispense medication, and we will not be able to write prescriptions for him as he goes to a pain clinic.  I advised him that he needs to stick with his normal prescription regimen and not complete his course of treatment early.  He will follow-up with his regular doctors.  I gave my usual return precautions. [CF]    Clinical Course User Index [CF] Loleta Rose, MD     FINAL CLINICAL IMPRESSION(S) / ED DIAGNOSES   Final diagnoses:  Chronic back pain, unspecified back location, unspecified back pain laterality  Acute kidney injury (HCC)  Hypokalemia     Rx / DC Orders   ED Discharge Orders          Ordered    ondansetron (ZOFRAN-ODT) 4 MG disintegrating tablet        02/08/23 0218    potassium chloride SA (KLOR-CON M20) 20 MEQ tablet  Daily        02/08/23 0218             Note:  This document was prepared using Dragon voice recognition software and may include unintentional dictation errors.   Loleta Rose, MD 02/08/23 1037

## 2023-02-08 NOTE — Discharge Instructions (Signed)
As we discussed, it appears that you are a bit dehydrated, likely due to your recent nausea and vomiting.  Please drink plenty of fluids and use the prescribed nausea medicine as needed.  Follow-up with your regular doctors to discuss your chronic pain regimen.  Continue taking your other medications as previously prescribed.  Return to the emergency department if you develop new or worsening symptoms that concern you.

## 2023-02-09 ENCOUNTER — Ambulatory Visit: Payer: Medicare PPO | Admitting: Family Medicine

## 2023-02-14 ENCOUNTER — Telehealth: Payer: Self-pay

## 2023-02-14 NOTE — Telephone Encounter (Signed)
Transition Care Management Unsuccessful Follow-up Telephone Call  Date of discharge and from where:  02/08/2023 [facility]  Attempts:  1st Attempt  Reason for unsuccessful TCM follow-up call:  Left voice message  Soley Harriss [facility]  [facility] Population Health Community Resource Care Guide   [email or web address]  ?? [phone]   Website: [email or web address]  [email or web address]

## 2023-02-15 ENCOUNTER — Telehealth: Payer: Self-pay

## 2023-02-15 NOTE — Telephone Encounter (Signed)
Transition Care Management Follow-up Telephone Call Date of discharge and from where: 02/08/2023 [facility] How have you been since you were released from the hospital? Patient is feeling better. Any questions or concerns? No  Items Reviewed: Did the pt receive and understand the discharge instructions provided? Yes  Medications obtained and verified? Yes  Other? No  Any new allergies since your discharge? No  Dietary orders reviewed? Yes Do you have support at home? Yes   Follow up appointments reviewed:  PCP Hospital f/u appt confirmed?  Patient stated he will call for an appointment with Dr. Birdie Sons.  Scheduled to see  on  @ . Specialist Hospital f/u appt confirmed? Yes  Scheduled to see Mosetta Pigeon, MD on 02/28/2023 @ [facility]. Are transportation arrangements needed? No  If their condition worsens, is the pt aware to call PCP or go to the Emergency Dept.? Yes Was the patient provided with contact information for the PCP's office or ED? Yes Was to pt encouraged to call back with questions or concerns? Yes  Aurea Aronov [facility]  [facility] Population Health Community Resource Care Guide   [email or web address]  ?? [phone]   Website: [email or web address]  [email or web address]

## 2023-02-22 ENCOUNTER — Other Ambulatory Visit: Payer: Self-pay | Admitting: Family Medicine

## 2023-02-22 DIAGNOSIS — M5136 Other intervertebral disc degeneration, lumbar region: Secondary | ICD-10-CM

## 2023-02-22 NOTE — Telephone Encounter (Signed)
Refilled: 01/11/2023 Last OV: 02/01/2023 Next OV: 05/06/2023

## 2023-02-23 DIAGNOSIS — M810 Age-related osteoporosis without current pathological fracture: Secondary | ICD-10-CM | POA: Diagnosis not present

## 2023-03-10 DIAGNOSIS — M5416 Radiculopathy, lumbar region: Secondary | ICD-10-CM | POA: Diagnosis not present

## 2023-03-10 DIAGNOSIS — M48061 Spinal stenosis, lumbar region without neurogenic claudication: Secondary | ICD-10-CM | POA: Diagnosis not present

## 2023-03-10 DIAGNOSIS — M4856XA Collapsed vertebra, not elsewhere classified, lumbar region, initial encounter for fracture: Secondary | ICD-10-CM | POA: Diagnosis not present

## 2023-03-10 DIAGNOSIS — M47817 Spondylosis without myelopathy or radiculopathy, lumbosacral region: Secondary | ICD-10-CM | POA: Diagnosis not present

## 2023-03-10 DIAGNOSIS — M5126 Other intervertebral disc displacement, lumbar region: Secondary | ICD-10-CM | POA: Diagnosis not present

## 2023-03-13 ENCOUNTER — Emergency Department
Admission: EM | Admit: 2023-03-13 | Discharge: 2023-03-13 | Disposition: A | Discharge status: Home / Self Care | Payer: Medicare PPO | Attending: Emergency Medicine | Admitting: Emergency Medicine

## 2023-03-13 ENCOUNTER — Other Ambulatory Visit: Payer: Self-pay

## 2023-03-13 DIAGNOSIS — E876 Hypokalemia: Secondary | ICD-10-CM | POA: Diagnosis not present

## 2023-03-13 DIAGNOSIS — R531 Weakness: Secondary | ICD-10-CM | POA: Insufficient documentation

## 2023-03-13 DIAGNOSIS — I509 Heart failure, unspecified: Secondary | ICD-10-CM | POA: Diagnosis not present

## 2023-03-13 DIAGNOSIS — R5383 Other fatigue: Secondary | ICD-10-CM | POA: Insufficient documentation

## 2023-03-13 DIAGNOSIS — R55 Syncope and collapse: Secondary | ICD-10-CM | POA: Insufficient documentation

## 2023-03-13 DIAGNOSIS — E119 Type 2 diabetes mellitus without complications: Secondary | ICD-10-CM | POA: Insufficient documentation

## 2023-03-13 DIAGNOSIS — I959 Hypotension, unspecified: Secondary | ICD-10-CM | POA: Diagnosis not present

## 2023-03-13 DIAGNOSIS — W19XXXA Unspecified fall, initial encounter: Secondary | ICD-10-CM | POA: Diagnosis not present

## 2023-03-13 LAB — CBC
HCT: 41.6 % (ref 39.0–52.0)
Hemoglobin: 13.2 g/dL (ref 13.0–17.0)
MCH: 35.1 pg — ABNORMAL HIGH (ref 26.0–34.0)
MCHC: 31.7 g/dL (ref 30.0–36.0)
MCV: 110.6 fL — ABNORMAL HIGH (ref 80.0–100.0)
Platelets: 171 10*3/uL (ref 150–400)
RBC: 3.76 MIL/uL — ABNORMAL LOW (ref 4.22–5.81)
RDW: 13.9 % (ref 11.5–15.5)
WBC: 8.6 10*3/uL (ref 4.0–10.5)
nRBC: 0 % (ref 0.0–0.2)

## 2023-03-13 LAB — BASIC METABOLIC PANEL
Anion gap: 11 (ref 5–15)
BUN: 20 mg/dL (ref 8–23)
CO2: 23 mmol/L (ref 22–32)
Calcium: 8.4 mg/dL — ABNORMAL LOW (ref 8.9–10.3)
Chloride: 107 mmol/L (ref 98–111)
Creatinine, Ser: 1.73 mg/dL — ABNORMAL HIGH (ref 0.61–1.24)
GFR, Estimated: 41 mL/min — ABNORMAL LOW (ref >60)
Glucose, Bld: 120 mg/dL — ABNORMAL HIGH (ref 70–99)
Potassium: 3.3 mmol/L — ABNORMAL LOW (ref 3.5–5.1)
Sodium: 141 mmol/L (ref 135–145)

## 2023-03-13 LAB — TROPONIN I (HIGH SENSITIVITY): Troponin I (High Sensitivity): 10 ng/L (ref <18)

## 2023-03-13 NOTE — ED Provider Notes (Signed)
[facility] Provider Note   Event Date/Time   First MD Initiated Contact with Patient 03/13/23 2155     (approximate) History  Loss of Consciousness  HPI Justin Patel is a 72 y.o. male with past medical history of heart failure who presents complaining of a syncopal episode during church today.  Patient states that he has had intermittent lightheadedness symptoms over the past few months that have worsened today.  Patient states that he began feeling lightheadedness, generalized weakness, and bilateral lower extremity fatigue suddenly at church today and was helped to the ground.  Patient states that he remembers everything however her significant other at bedside states that he did lose consciousness.  Patient returned to baseline and under a minute after being laid flat on the ground.  Patient denies any symptoms similar to this after this episode.  Patient denies any preceding symptoms such as palpitation, diaphoresis, chest pain ROS: Patient currently denies any vision changes, tinnitus, difficulty speaking, facial droop, sore throat, chest pain, shortness of breath, abdominal pain, nausea/vomiting/diarrhea, dysuria, or weakness/numbness/paresthesias in any extremity   Physical Exam  Triage Vital Signs: ED Triage Vitals  Encounter Vitals Group     BP 03/13/23 2002 112/77     Systolic BP Percentile --      Diastolic BP Percentile --      Pulse Rate 03/13/23 2002 86     Resp 03/13/23 2002 18     Temp 03/13/23 2002 98.9 F (37.2 C)     Temp src --      SpO2 03/13/23 2002 98 %     Weight --      Height --      Head Circumference --      Peak Flow --      Pain Score 03/13/23 2002 2     Pain Loc --      Pain Education --      Exclude from Growth Chart --    Most recent vital signs: Vitals:   03/13/23 2002  BP: 112/77  Pulse: 86  Resp: 18  Temp: 98.9 F (37.2 C)  SpO2: 98%   General: Awake, oriented x4. CV:  Good peripheral perfusion.   Resp:  Normal effort.  Abd:  No distention.  Other:  Well-developed, well-nourished elderly Caucasian male laying in bed in no acute distress ED Results / Procedures / Treatments  Labs (all labs ordered are listed, but only abnormal results are displayed) Labs Reviewed  BASIC METABOLIC PANEL - Abnormal; Notable for the following components:      Result Value   Potassium 3.3 (*)    Glucose, Bld 120 (*)    Creatinine, Ser 1.73 (*)    Calcium 8.4 (*)    GFR, Estimated 41 (*)    All other components within normal limits  CBC - Abnormal; Notable for the following components:   RBC 3.76 (*)    MCV 110.6 (*)    MCH 35.1 (*)    All other components within normal limits  URINALYSIS, ROUTINE W REFLEX MICROSCOPIC  TROPONIN I (HIGH SENSITIVITY)  TROPONIN I (HIGH SENSITIVITY)   EKG ED ECG REPORT I, Merwyn Katos, the attending physician, personally viewed and interpreted this ECG. Date: 03/13/2023 EKG Time: 2007 Rate: 84 Rhythm: normal sinus rhythm QRS Axis: normal Intervals: normal ST/T Wave abnormalities: normal Narrative Interpretation: Normal sinus rhythm with PACs.  No evidence of acute ischemia PROCEDURES: Critical Care performed: No .1-3 Lead EKG Interpretation  Performed by:  Merwyn Katos, MD Authorized by: Merwyn Katos, MD     Interpretation: normal     ECG rate:  71   ECG rate assessment: normal     Rhythm: sinus rhythm     Ectopy: none     Conduction: normal    MEDICATIONS ORDERED IN ED: Medications - No data to display IMPRESSION / MDM / ASSESSMENT AND PLAN / ED COURSE  I reviewed the triage vital signs and the nursing notes.                             The patient is on the cardiac monitor to evaluate for evidence of arrhythmia and/or significant heart rate changes. Patient's presentation is most consistent with acute presentation with potential threat to life or bodily function. Patient presents with complaints of syncope/presyncope ED Workup:  CBC,  BMP, Troponin, BNP, ECG, CXR Differential diagnosis includes HF, ICH, seizure, stroke, HOCM, ACS, aortic dissection, malignant arrhythmia, or GI bleed. Findings: No evidence of acute laboratory abnormalities.  Troponin negative x1 EKG: No e/o STEMI. No evidence of Brugadas sign, delta wave, epsilon wave, significantly prolonged QTc, or malignant arrhythmia.  Disposition: Discharge. Patient is at baseline at this time. Return precautions expressed and understood in person. Advised follow up with primary care provider or clinic physician in next 24 hours.   FINAL CLINICAL IMPRESSION(S) / ED DIAGNOSES   Final diagnoses:  Syncope and collapse  Hypotension, unspecified hypotension type   Rx / DC Orders   ED Discharge Orders          Ordered    Ambulatory referral to Cardiology       Comments: If you have not heard from the Cardiology office within the next 72 hours please call [phone].   03/13/23 2241           Note:  This document was prepared using Dragon voice recognition software and may include unintentional dictation errors.   Merwyn Katos, MD 03/13/23 [id number]

## 2023-03-13 NOTE — ED Notes (Signed)
Lab called to add on Troponin spoke with Rosey Bath.

## 2023-03-13 NOTE — ED Triage Notes (Signed)
Per EMS, pt had syncopal episode at church- was standing and was helped to ground and was not injured. Pt AOX4, NAD noted. History of DM NSR BGL-180 Chronic back pain

## 2023-03-15 ENCOUNTER — Encounter: Payer: Self-pay | Admitting: Family Medicine

## 2023-03-15 ENCOUNTER — Ambulatory Visit: Payer: Medicare PPO | Admitting: Family Medicine

## 2023-03-15 VITALS — BP 122/78 | HR 52 | Temp 97.6°F | Ht 66.0 in | Wt 177.0 lb

## 2023-03-15 DIAGNOSIS — J029 Acute pharyngitis, unspecified: Secondary | ICD-10-CM | POA: Diagnosis not present

## 2023-03-15 DIAGNOSIS — R600 Localized edema: Secondary | ICD-10-CM

## 2023-03-15 DIAGNOSIS — F4321 Adjustment disorder with depressed mood: Secondary | ICD-10-CM | POA: Diagnosis not present

## 2023-03-15 DIAGNOSIS — Z87898 Personal history of other specified conditions: Secondary | ICD-10-CM | POA: Diagnosis not present

## 2023-03-15 DIAGNOSIS — R21 Rash and other nonspecific skin eruption: Secondary | ICD-10-CM

## 2023-03-15 DIAGNOSIS — R55 Syncope and collapse: Secondary | ICD-10-CM

## 2023-03-15 HISTORY — DX: Adjustment disorder with depressed mood: F43.21

## 2023-03-15 HISTORY — DX: Acute pharyngitis, unspecified: J02.9

## 2023-03-15 LAB — BASIC METABOLIC PANEL
BUN: 20 mg/dL (ref 6–23)
CO2: 24 mEq/L (ref 19–32)
Calcium: 8.7 mg/dL (ref 8.4–10.5)
Chloride: 107 mEq/L (ref 96–112)
Creatinine, Ser: 1.47 mg/dL (ref 0.40–1.50)
GFR: 47.5 mL/min — ABNORMAL LOW (ref >60.00)
Glucose, Bld: 93 mg/dL (ref 70–99)
Potassium: 3.6 mEq/L (ref 3.5–5.1)
Sodium: 138 mEq/L (ref 135–145)

## 2023-03-15 LAB — POC COVID19 BINAXNOW: SARS Coronavirus 2 Ag: NEGATIVE

## 2023-03-15 MED ORDER — FUROSEMIDE 20 MG PO TABS
20.0000 mg | ORAL_TABLET | Freq: Every day | ORAL | Status: AC | PRN
Start: 2023-03-15 — End: ?

## 2023-03-15 NOTE — Assessment & Plan Note (Signed)
Swabbing for COVID.  Could be allergy related.  Patient will try to stay home until we have the COVID test result back.
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
## 2023-03-15 NOTE — Assessment & Plan Note (Addendum)
Chronic issue.  Patient reports maximum consumption of alcohol at 1 time is 4 drinks.  In the past he reports maximum consumption per week is 4 drinks 2 times a week.  Encouraged patient to continue to abstain from alcohol.  He notes he will go through the legal process regarding his DUI.

## 2023-03-15 NOTE — Assessment & Plan Note (Addendum)
Offered support.  Patient will monitor.

## 2023-03-15 NOTE — Assessment & Plan Note (Addendum)
Discussed that this may be related to orthostasis due to dehydration and being on blood pressure medication.  Discussed I think less likely this would be related to arrhythmia.  He will see cardiology as planned.  Will recheck kidney function today.  He will discontinue Lasix and monitor his blood pressure, edema, shortness of breath, or other signs of fluid accumulation.  If kidney function is acceptable then he can use the Lasix on an as-needed basis.  Encouraged adequate hydration.

## 2023-03-15 NOTE — Assessment & Plan Note (Addendum)
Chronic issue.  Undetermined cause.  Photo taken to the dermatology can review once he follows up with them.  He will see dermatology as planned.

## 2023-03-15 NOTE — Patient Instructions (Signed)
Nice to see you. Please stop the Lasix at this time.  You may potentially be able to resume this on a daily as needed basis though we need to have your hydration status and kidney function improved before we do that. If you pass out again go to the emergency room. Please see cardiology as planned. Will contact you with your lab results.

## 2023-03-16 ENCOUNTER — Other Ambulatory Visit: Payer: Self-pay | Admitting: Family Medicine

## 2023-03-16 ENCOUNTER — Telehealth: Payer: Self-pay

## 2023-03-16 DIAGNOSIS — N179 Acute kidney failure, unspecified: Secondary | ICD-10-CM

## 2023-03-16 NOTE — Telephone Encounter (Signed)
-----   Message from Marikay Alar sent at 03/16/2023  3:48 PM EDT ----- Please let the patient know his kidney function has improved slightly from 3 days ago.  He needs to take an adequate hydration.  We should recheck a BMP in 1 week.  Order placed.

## 2023-03-16 NOTE — Progress Notes (Deleted)
Cardiology Office Note:    Date:  03/16/2023   ID:  Justin Patel, DOB [birth year], MRN [id number]  PCP:  [name], MD  [facility] Cardiologist:  None  [facility] Electrophysiologist:  None   Referring MD: [name], MD   Chief Complaint: ***  History of Present Illness:    Justin Patel is a 72 y.o. male with a hx of chronic diastolic heart failure, morbid obesity s/p gatric bypass, iron deficiecny anemia, DM2, HTN, HLD, anxiety, prior alcohol abuse, degenerative disc disease with previousy back surgery, GERD who presents for follow-up.   Echo in June 2020 showed LVEF 55-60% with no significant valvular abnormalities.   Patient was last seen 08/2022 for pre-syncope vs seizure disorder. EKG showed PVCs.   Patient recently had an episode of syncope. He was evaluated in the ER 03/13/23 and was noted to have AKI, though was not given IVF.   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  removal scheduled to clear margins.   CHF (congestive heart failure) (HCC)    no longer after weight loss   Chronic kidney disease    cysts   Degenerative disc disease    Depression    Diabetes mellitus    no longer diabetic-or on meds   DJD (degenerative joint disease)    Dysplastic nevus 02/04/2016   R upper back paraspinal - severe   Family history of adverse reaction to anesthesia    sons wake up combative   Foot fracture, left 01/2022   Gastric ulcer    GERD (gastroesophageal reflux disease)    H/O hiatal hernia    Headache(784.0)    sinus   Heart murmur    History of fusion of thoracic spine 12/28/2021   History of gastric bypass    Hx of congestive heart failure    Hyperlipidemia    Hypertension    Iron deficiency anemia 02/28/2015   Lumbar scoliosis    Opiate abuse, continuous (HCC)  06/20/2015   S/P laparoscopic cholecystectomy 02/18/2020   Sacral fracture, closed (HCC)    Seizures (HCC)    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, after GI bleed   Sleep apnea    hx not now since wt loss   Stones in the urinary tract    Syncope and collapse     Past Surgical History:  Procedure Laterality Date   ANTERIOR CERVICAL DECOMP/DISCECTOMY FUSION  01/26/2012   Procedure: ANTERIOR CERVICAL DECOMPRESSION/DISCECTOMY FUSION 3 LEVELS;  Surgeon: [name], MD;  Location: [facility];  Service: Neurosurgery;  Laterality: N/A;  Cervical three-four Cervical four-five Cervical five-six Cervical six-seven , Anterior cervical decompression/diskectomy, fusion, plate   ANTERIOR LAT LUMBAR FUSION Right 04/25/2018   Procedure: Right Lumbar two-three Lumbar three-four Lumbar four-five anterior  lateral interbody fusion  with posterior percutaneous pedicle screws;  Surgeon: [name], MD;  Location: [facility] OR;  Service: Neurosurgery;  Laterality: Right;   APPENDECTOMY     CARDIAC CATHETERIZATION     [facility], normal   CARDIAC CATHETERIZATION     [place]   CHOLECYSTECTOMY     COLONOSCOPY WITH PROPOFOL N/A 12/27/2017   Procedure: COLONOSCOPY WITH PROPOFOL;  Surgeon: Toledo, [name], MD;  Location: [facility];  Service: Gastroenterology;  Laterality: N/A;  ESOPHAGOGASTRODUODENOSCOPY (EGD) WITH PROPOFOL N/A 12/27/2017   Procedure: ESOPHAGOGASTRODUODENOSCOPY (EGD) WITH PROPOFOL;  Surgeon: Toledo, [name], MD;  Location: [facility] ENDOSCOPY;  Service: Gastroenterology;  Laterality: N/A;   ESOPHAGOGASTRODUODENOSCOPY (EGD) WITH PROPOFOL N/A 06/19/2021   Procedure: ESOPHAGOGASTRODUODENOSCOPY (EGD) WITH PROPOFOL;  Surgeon: [name], MD;  Location: [facility] ENDOSCOPY;  Service: Gastroenterology;  Laterality: N/A;   ESOPHAGOGASTRODUODENOSCOPY (EGD) WITH PROPOFOL N/A 09/25/2021   Procedure: ESOPHAGOGASTRODUODENOSCOPY (EGD) WITH PROPOFOL;  Surgeon: [name], MD;   Location: [facility] ENDOSCOPY;  Service: Endoscopy;  Laterality: N/A;   GASTRIC BYPASS  08/09/2008   [employer]   HARDWARE REMOVAL N/A 03/08/2022   Procedure: Removal of bilateral Thoracic ten pedicle screws;  Surgeon: [name], MD;  Location: [facility] OR;  Service: Neurosurgery;  Laterality: N/A;   HERNIA REPAIR  08/09/1998   hiatal   JOINT REPLACEMENT     KNEE ARTHROSCOPY     bilateral, left x 2   LAMINECTOMY WITH POSTERIOR LATERAL ARTHRODESIS LEVEL 4 N/A 01/14/2020   Procedure: Decompressive Laminectomy Lumbar One with pedicle screw fixation from Thoracic Ten to Lumbar Two;  Surgeon: [name], MD;  Location: [facility] OR;  Service: Neurosurgery;  Laterality: N/A;  Decompressive Laminectomy Lumbar One with pedicle screw fixation from Thoracic Ten to Lumbar Two   LUMBAR PERCUTANEOUS PEDICLE SCREW 3 LEVEL N/A 04/25/2018   Procedure: LUMBAR PERCUTANEOUS PEDICLE SCREW 3 LEVEL;  Surgeon: [name], MD;  Location: [facility] OR;  Service: Neurosurgery;  Laterality: N/A;   NASAL SINUS SURGERY     x5   OSTEOTOMY  08/10/1999   left   ROUX-EN-Y GASTRIC BYPASS     THORACIC LAMINECTOMY FOR EPIDURAL ABSCESS N/A 03/28/2022   Procedure: THORACIC WOUND WASHOUT;  Surgeon: [name], MD;  Location: [facility] OR;  Service: Neurosurgery;  Laterality: N/A;   TONSILLECTOMY     TOTAL KNEE ARTHROPLASTY Left 05/09/2014   Dr. [name]   UVULOPALATOPLASTY  08/09/2009    Current Medications: No outpatient medications have been marked as taking for the 03/17/23 encounter (Appointment) with Fransico Michael,  H, PA-C.     Allergies:   Altace [ramipril], Ace inhibitors, Levaquin [levofloxacin in d5w], Lyrica [pregabalin], Metformin, and Trulicity [dulaglutide]   Social History   Socioeconomic History   Marital status: Married    Spouse name: Not on file   Number of children: 2   Years of education: Not on file   Highest education level: Not on file  Occupational History   Not on file  Tobacco Use   Smoking status: Never    Smokeless tobacco: Never  Vaping Use   Vaping status: Never Used  Substance and Sexual Activity   Alcohol use: Yes    Alcohol/week: 21.0 standard drinks of alcohol    Types: 21 Glasses of wine per week    Comment: occassionally   Drug use: No   Sexual activity: Yes    Partners: Female  Other Topics Concern   Not on file  Social History Narrative   Lives in [place] with wife, [place]. 2 sons, Roderic Ovens, Caryn Bee 30.. 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      Diet - regular diet, limited quantities after gastric bypass      Exercise - no regular, limited by arthritis in knees, occasional water aerobics   Right handed   One story house   Social Determinants of Health   Financial Resource Strain: Low Risk  (07/06/2022)   Overall Financial  Resource Strain (CARDIA)    Difficulty of Paying Living Expenses: Not hard at all  Food Insecurity: No Food Insecurity (11/26/2022)   Hunger Vital Sign    Worried About Running Out of Food in the Last Year: Never true    Ran Out of Food in the Last Year: Never true  Transportation Needs: No Transportation Needs (11/26/2022)   PRAPARE - Administrator, Civil Service (Medical): No    Lack of Transportation (Non-Medical): No  Physical Activity: Unknown (06/22/2021)   Exercise Vital Sign    Days of Exercise per Week: 0 days    Minutes of Exercise per Session: Not on file  Stress: No Stress Concern Present (07/06/2022)   [employer] of Occupational Health - Occupational Stress Questionnaire    Feeling of Stress : Not at all  Social Connections: Unknown (07/06/2022)   Social Connection and Isolation Panel [NHANES]    Frequency of Communication with Friends and Family: Not on file    Frequency of Social Gatherings with Friends and Family: More than three times a week    Attends Religious Services: Not on Marketing executive or Organizations: Not on file    Attends  Banker Meetings: Not on file    Marital Status: Married     Family History: The patient's ***family history includes Alcoholism in his maternal uncle; Dementia (age of onset: 23) in his mother; Diabetes in his father; Heart disease (age of onset: 82) in his father; Lung cancer in his paternal aunt, paternal aunt, paternal aunt, and paternal aunt; Lupus in his sister; Lymphoma in his sister; Mesothelioma in his cousin; Osteoporosis in his mother; Ovarian cancer (age of onset: 5) in his sister; Prostate cancer in his maternal uncle; Skin cancer in his maternal uncle; Tongue cancer in his maternal uncle.  ROS:   Please see the history of present illness.    *** All other systems reviewed and are negative.  EKGs/Labs/Other Studies Reviewed:    The following studies were reviewed today: ***  EKG:  EKG is *** ordered today.  The ekg ordered today demonstrates ***  Recent Labs: 11/20/2022: Magnesium 1.7 12/22/2022: ALT 53 03/13/2023: Hemoglobin 13.2; Platelets 171 03/15/2023: BUN 20; Creatinine, Ser 1.47; Potassium 3.6; Sodium 138  Recent Lipid Panel    Component Value Date/Time   CHOL 144 08/11/2022 1415   CHOL 171 03/14/2019 0000   TRIG 81.0 08/11/2022 1415   HDL 64.20 08/11/2022 1415   HDL 64 03/14/2019 0000   CHOLHDL 2 08/11/2022 1415   VLDL 16.2 08/11/2022 1415   LDLCALC 64 08/11/2022 1415   LDLCALC 77 03/14/2019 0000   LDLDIRECT 70.0 05/27/2021 0948     Risk Assessment/Calculations:   {Does this patient have ATRIAL FIBRILLATION?[phone]}   Physical Exam:    VS:  There were no vitals taken for this visit.    Wt Readings from Last 3 Encounters:  03/15/23 177 lb (80.3 kg)  02/07/23 180 lb (81.6 kg)  02/01/23 180 lb 12.8 oz (82 kg)     GEN: *** Well nourished, well developed in no acute distress HEENT: Normal NECK: No JVD; No carotid bruits LYMPHATICS: No lymphadenopathy CARDIAC: ***RRR, no murmurs, rubs, gallops RESPIRATORY:  Clear to auscultation  without rales, wheezing or rhonchi  ABDOMEN: Soft, non-tender, non-distended MUSCULOSKELETAL:  No edema; No deformity  SKIN: Warm and dry NEUROLOGIC:  Alert and oriented x 3 PSYCHIATRIC:  Normal affect   ASSESSMENT:    No diagnosis found.  PLAN:    In order of problems listed above:  ***  Disposition: Follow up {follow up:15908} with ***   Shared Decision Making/Informed Consent   {Are you ordering a CV Procedure (e.g. stress test, cath, DCCV, TEE, etc)?   Press F2        :[id number]}    Signed,  David Stall, PA-C  03/16/2023 1:46 PM    [facility]

## 2023-03-16 NOTE — Telephone Encounter (Signed)
See message below, lvm to discuss results and schedule lab appt

## 2023-03-17 ENCOUNTER — Ambulatory Visit: Payer: Medicare PPO | Admitting: Medical

## 2023-03-21 ENCOUNTER — Telehealth: Payer: Self-pay

## 2023-03-21 NOTE — Telephone Encounter (Signed)
Transition Care Management Unsuccessful Follow-up Telephone Call  Date of discharge and from where:  [place] 8/4  Attempts:  1st Attempt  Reason for unsuccessful TCM follow-up call:  No answer/busy   Lenard Forth [facility] Guide, [facility] [phone] [address] [place], [place], [place] [address] Phone: [phone] Email: [email or web address]

## 2023-03-22 ENCOUNTER — Telehealth: Payer: Self-pay

## 2023-03-22 NOTE — Telephone Encounter (Signed)
Transition Care Management Unsuccessful Follow-up Telephone Call  Date of discharge and from where:  [place] 8/4  Attempts:  2nd Attempt  Reason for unsuccessful TCM follow-up call:  No answer/busy   Lenard Forth [facility] Guide, [facility] [phone] [address] [place], [place], [place] [address] Phone: [phone] Email: [email or web address]

## 2023-03-23 ENCOUNTER — Other Ambulatory Visit (INDEPENDENT_AMBULATORY_CARE_PROVIDER_SITE_OTHER): Payer: Medicare PPO

## 2023-03-23 DIAGNOSIS — N179 Acute kidney failure, unspecified: Secondary | ICD-10-CM | POA: Diagnosis not present

## 2023-03-23 LAB — BASIC METABOLIC PANEL
BUN: 23 mg/dL (ref 6–23)
CO2: 32 mEq/L (ref 19–32)
Calcium: 8.1 mg/dL — ABNORMAL LOW (ref 8.4–10.5)
Chloride: 103 mEq/L (ref 96–112)
Creatinine, Ser: 1.6 mg/dL — ABNORMAL HIGH (ref 0.40–1.50)
GFR: 42.9 mL/min — ABNORMAL LOW (ref >60.00)
Glucose, Bld: 89 mg/dL (ref 70–99)
Potassium: 3.9 mEq/L (ref 3.5–5.1)
Sodium: 140 mEq/L (ref 135–145)

## 2023-03-24 ENCOUNTER — Other Ambulatory Visit (HOSPITAL_COMMUNITY): Payer: Self-pay | Admitting: Neurological Surgery

## 2023-03-24 ENCOUNTER — Encounter (INDEPENDENT_AMBULATORY_CARE_PROVIDER_SITE_OTHER): Payer: Self-pay

## 2023-03-25 ENCOUNTER — Telehealth: Payer: Self-pay

## 2023-03-25 DIAGNOSIS — R944 Abnormal results of kidney function studies: Secondary | ICD-10-CM

## 2023-03-25 NOTE — Telephone Encounter (Signed)
Patient states he is returning our call.  I do not see a message in our system.  I let patient know that I will send a message back to let them know that he is returning their call.  I let patient know that it may be Monday before he hears from someone.

## 2023-03-25 NOTE — Telephone Encounter (Signed)
-----   Message from Marikay Alar sent at 03/25/2023 11:58 AM EDT ----- Please of the patient know his kidney function is slightly worse than it was when it was checked a week ago.  How much water is he drinking?  Is he taking any anti-inflammatory such as ibuprofen or Aleve?  Has he started any new supplements?  Has he started any new medications?  Is he taking Lasix on a daily basis?

## 2023-03-25 NOTE — Telephone Encounter (Signed)
Lvm for pt to give [place] a callback about labs results see message below

## 2023-03-28 NOTE — Telephone Encounter (Signed)
Patient states he is drinking more water than usual 4 big glasses a day. No anti inflammatories due to gastric by pass and stomach ulcers. No supplements or new medications. No lasix or prostate medication since 03/15/23 when he was here and you told him to stop those 2 medications due to being dehydrated.  Patient states he is having bilateral ankle swelling but other than that he feels pretty good.

## 2023-03-29 NOTE — Telephone Encounter (Signed)
Noted.  Given the change in his kidney function I think doing an ultrasound of his kidneys at this time would be worthwhile.  I can place that order when she speak with him.  We should also quantify how much protein he has in his urine.  Thanks.

## 2023-03-30 NOTE — Telephone Encounter (Signed)
Renal ultrasound ordered

## 2023-03-30 NOTE — Addendum Note (Signed)
Addended by: [name] on: 03/30/2023 10:56 AM   Modules accepted: Orders

## 2023-03-30 NOTE — Telephone Encounter (Signed)
Spoke to Patient he states a history of kidney stones when he was in his 20's and a more recent history of kidney cysts. Patient states he is agreeable to the Ultrasound and has been drinking a gallon of water a day and denies any kidney pain.

## 2023-04-12 ENCOUNTER — Ambulatory Visit
Admission: RE | Admit: 2023-04-12 | Discharge: 2023-04-12 | Disposition: A | Discharge status: Home / Self Care | Payer: Medicare PPO | Source: Ambulatory Visit | Attending: Family Medicine | Admitting: Family Medicine

## 2023-04-12 ENCOUNTER — Other Ambulatory Visit: Payer: Self-pay | Admitting: Family Medicine

## 2023-04-12 DIAGNOSIS — N281 Cyst of kidney, acquired: Secondary | ICD-10-CM | POA: Diagnosis not present

## 2023-04-12 DIAGNOSIS — R944 Abnormal results of kidney function studies: Secondary | ICD-10-CM | POA: Insufficient documentation

## 2023-04-13 ENCOUNTER — Encounter: Payer: Self-pay | Admitting: Family Medicine

## 2023-04-13 ENCOUNTER — Ambulatory Visit: Payer: Medicare PPO | Admitting: Family Medicine

## 2023-04-13 VITALS — BP 124/80 | HR 63 | Temp 97.9°F | Ht 66.0 in | Wt 168.0 lb

## 2023-04-13 DIAGNOSIS — E119 Type 2 diabetes mellitus without complications: Secondary | ICD-10-CM | POA: Diagnosis not present

## 2023-04-13 DIAGNOSIS — L299 Pruritus, unspecified: Secondary | ICD-10-CM

## 2023-04-13 DIAGNOSIS — Z7984 Long term (current) use of oral hypoglycemic drugs: Secondary | ICD-10-CM | POA: Diagnosis not present

## 2023-04-13 DIAGNOSIS — R944 Abnormal results of kidney function studies: Secondary | ICD-10-CM | POA: Diagnosis not present

## 2023-04-13 DIAGNOSIS — I10 Essential (primary) hypertension: Secondary | ICD-10-CM | POA: Diagnosis not present

## 2023-04-13 NOTE — Patient Instructions (Signed)
Please start claritin over the counter to see if that helps with your itching.

## 2023-04-13 NOTE — Assessment & Plan Note (Signed)
Renal ultrasound revealed medical renal disease.  Discussed this likely is related to his chronic issues of diabetes and hypertension.  He does have some cyst that would seem less likely that this would be contributing.  Will recheck renal function today and if not improved I would have him follow-up with his nephrologist.

## 2023-04-13 NOTE — Assessment & Plan Note (Signed)
Chronic issue.  Seems adequately controlled.  Patient will continue Tradjenta 5 mg daily.  Check A1c.

## 2023-04-13 NOTE — Assessment & Plan Note (Signed)
Chronic issue.  Adequately controlled.  Patient will continue metoprolol 50 mg daily and amlodipine 5 mg daily.

## 2023-04-13 NOTE — Progress Notes (Signed)
Marikay Alar, MD Phone: [phone]  Justin Patel is a 72 y.o. male who presents today for follow-up.  Diabetes: Typically 80-120.  On Tradjenta.  No polyuria or polydipsia.  No hypoglycemia.  No longer drinking alcohol.  Hypertension: Typically around 120/80.  Taking amlodipine and metoprolol.  Itching: Patient notes this has been going on for a week.  It occurs all over his body.  Occasionally he will have some bumps, and they will start to itch though he has itching in areas where there is no rash.  Reduced GFR: Patient had a renal ultrasound which revealed medical renal disease as well as renal cysts.  He notes he has been urinating less volume since stopping furosemide.  Social History   Tobacco Use  Smoking Status Never  Smokeless Tobacco Never    Current Outpatient Medications on File Prior to Visit  Medication Sig Dispense Refill   acetaminophen (TYLENOL) 325 MG tablet Take 650 mg by mouth every 6 (six) hours as needed for moderate pain.     amLODipine (NORVASC) 5 MG tablet Take 1 tablet (5 mg total) by mouth daily. 90 tablet 1   Calcium Carbonate-Vitamin D (CALCIUM 600/VITAMIN D PO) Take 1 tablet by mouth daily.     Cholecalciferol 50 MCG (2000 UT) TABS Take 2,000 Units by mouth daily.     cyclobenzaprine (FLEXERIL) 5 MG tablet TAKE 1 TABLET BY MOUTH EVERY 8 HOURS AS NEEDED FOR SPASMS. (Patient taking differently: Take 5 mg by mouth as needed for muscle spasms.) 30 tablet 1   esomeprazole (NEXIUM) 40 MG capsule Take 1 capsule (40 mg total) by mouth daily. 90 capsule 1   ezetimibe (ZETIA) 10 MG tablet Take 1 tablet (10 mg total) by mouth daily. 90 tablet 3   furosemide (LASIX) 20 MG tablet Take 1 tablet (20 mg total) by mouth daily as needed for edema or fluid.     gabapentin (NEURONTIN) 300 MG capsule TAKE 1 CAPSULE BY MOUTH EVERY MORNING AND TAKE 2 CAPSULES WITH LUNCH AND TAKE 1 CAPSULE AT BEDTIME 360 capsule 1   HYDROcodone-acetaminophen (NORCO) 7.5-325 MG tablet  Take 1 tablet by mouth 4 (four) times daily as needed.     linagliptin (TRADJENTA) 5 MG TABS tablet Take 1 tablet (5 mg total) by mouth daily. 90 tablet 1   metoprolol succinate (TOPROL-XL) 50 MG 24 hr tablet Take 1 tablet (50 mg total) by mouth daily. Take with or immediately following a meal. 90 tablet 1   Multiple Vitamin (MULTIVITAMIN) capsule Take 1 capsule by mouth daily. With copper and zinc (Patient taking differently: Take 1 capsule by mouth daily.)     ondansetron (ZOFRAN-ODT) 4 MG disintegrating tablet Allow 1-2 tablets to dissolve in your mouth every 8 hours as needed for nausea/vomiting 30 tablet 0   potassium chloride SA (KLOR-CON M20) 20 MEQ tablet Take 1 tablet (20 mEq total) by mouth daily. 7 tablet 0   primidone (MYSOLINE) 50 MG tablet Take 1 tablet (50 mg total) by mouth at bedtime. 30 tablet 5   rosuvastatin (CRESTOR) 40 MG tablet Take 1 tablet (40 mg total) by mouth daily. 90 tablet 1   sucralfate (CARAFATE) 1 g tablet TAKE 1 TABLET BY MOUTH 4 TIMES DAILY 360 tablet 1   tamsulosin (FLOMAX) 0.4 MG CAPS capsule Take 2 capsules (0.8 mg total) by mouth daily. 180 capsule 1   venlafaxine XR (EFFEXOR-XR) 75 MG 24 hr capsule TAKE ONE CAPSULE EVERY MORNING WITH BREAKFAST (Patient taking differently: Take 75 mg by  mouth daily.) 90 capsule 1   No current facility-administered medications on file prior to visit.     ROS see history of present illness  Objective  Physical Exam Vitals:   04/13/23 1442  BP: 124/80  Pulse: 63  Temp: 97.9 F (36.6 C)  SpO2: 97%    BP Readings from Last 3 Encounters:  04/13/23 124/80  03/15/23 122/78  03/13/23 112/77   Wt Readings from Last 3 Encounters:  04/13/23 168 lb (76.2 kg)  03/15/23 177 lb (80.3 kg)  02/07/23 180 lb (81.6 kg)    Physical Exam Constitutional:      General: He is not in acute distress.    Appearance: He is not diaphoretic.  Cardiovascular:     Rate and Rhythm: Normal rate and regular rhythm.     Heart sounds:  Normal heart sounds.  Pulmonary:     Effort: Pulmonary effort is normal.     Breath sounds: Normal breath sounds.  Skin:    General: Skin is warm and dry.     Comments: No apparent rash on arms, back, or chest or stomach  Neurological:     Mental Status: He is alert.      Assessment/Plan: Please see individual problem list.  Itching -     Comprehensive metabolic panel -     CBC with Differential/Platelet  Type 2 diabetes mellitus without complication, without long-term current use of insulin (HCC) Assessment & Plan: Chronic issue.  Seems adequately controlled.  Patient will continue Tradjenta 5 mg daily.  Check A1c.  Orders: -     Hemoglobin A1c  Essential hypertension, benign Assessment & Plan: Chronic issue.  Adequately controlled.  Patient will continue metoprolol 50 mg daily and amlodipine 5 mg daily.   Itchy skin Assessment & Plan: Patient with widespread itching.  Will check CBC and liver enzymes to determine a cause.  No obvious rash to go along with this.  Discussed potential for seeing dermatology if we do not find a cause for this.  Patient can trial Claritin over-the-counter for this.   Decreased GFR Assessment & Plan: Renal ultrasound revealed medical renal disease.  Discussed this likely is related to his chronic issues of diabetes and hypertension.  He does have some cyst that would seem less likely that this would be contributing.  Will recheck renal function today and if not improved I would have him follow-up with his nephrologist.      Return in about 3 months (around 07/13/2023).   Marikay Alar, MD [facility]

## 2023-04-13 NOTE — Assessment & Plan Note (Addendum)
Patient with widespread itching.  Will check CBC and liver enzymes to determine a cause.  No obvious rash to go along with this.  Discussed potential for seeing dermatology if we do not find a cause for this.  Patient can trial Claritin over-the-counter for this.

## 2023-04-14 LAB — CBC WITH DIFFERENTIAL/PLATELET
Basophils Absolute: 0.1 10*3/uL (ref 0.0–0.1)
Basophils Relative: 0.8 % (ref 0.0–3.0)
Eosinophils Absolute: 0.2 10*3/uL (ref 0.0–0.7)
Eosinophils Relative: 1.7 % (ref 0.0–5.0)
HCT: 42.5 % (ref 39.0–52.0)
Hemoglobin: 13.9 g/dL (ref 13.0–17.0)
Lymphocytes Relative: 17.8 % (ref 12.0–46.0)
Lymphs Abs: 1.7 10*3/uL (ref 0.7–4.0)
MCHC: 32.7 g/dL (ref 30.0–36.0)
MCV: 108.2 fl — ABNORMAL HIGH (ref 78.0–100.0)
Monocytes Absolute: 0.6 10*3/uL (ref 0.1–1.0)
Monocytes Relative: 6.7 % (ref 3.0–12.0)
Neutro Abs: 6.9 10*3/uL (ref 1.4–7.7)
Neutrophils Relative %: 73 % (ref 43.0–77.0)
Platelets: 172 10*3/uL (ref 150.0–400.0)
RBC: 3.93 Mil/uL — ABNORMAL LOW (ref 4.22–5.81)
RDW: 14 % (ref 11.5–15.5)
WBC: 9.4 10*3/uL (ref 4.0–10.5)

## 2023-04-14 LAB — COMPREHENSIVE METABOLIC PANEL
ALT: 53 U/L (ref 0–53)
AST: 44 U/L — ABNORMAL HIGH (ref 0–37)
Albumin: 3.7 g/dL (ref 3.5–5.2)
Alkaline Phosphatase: 47 U/L (ref 39–117)
BUN: 15 mg/dL (ref 6–23)
CO2: 25 meq/L (ref 19–32)
Calcium: 8.4 mg/dL (ref 8.4–10.5)
Chloride: 108 meq/L (ref 96–112)
Creatinine, Ser: 1.29 mg/dL (ref 0.40–1.50)
GFR: 55.52 mL/min — ABNORMAL LOW (ref >60.00)
Glucose, Bld: 86 mg/dL (ref 70–99)
Potassium: 3.2 meq/L — ABNORMAL LOW (ref 3.5–5.1)
Sodium: 140 meq/L (ref 135–145)
Total Bilirubin: 0.3 mg/dL (ref 0.2–1.2)
Total Protein: 5.9 g/dL — ABNORMAL LOW (ref 6.0–8.3)

## 2023-04-14 LAB — HEMOGLOBIN A1C: Hgb A1c MFr Bld: 6.4 % (ref 4.6–6.5)

## 2023-04-19 ENCOUNTER — Other Ambulatory Visit: Payer: Self-pay | Admitting: Family Medicine

## 2023-04-19 DIAGNOSIS — E876 Hypokalemia: Secondary | ICD-10-CM

## 2023-04-19 MED ORDER — POTASSIUM CHLORIDE CRYS ER 20 MEQ PO TBCR
40.0000 meq | EXTENDED_RELEASE_TABLET | Freq: Every day | ORAL | 0 refills | Status: AC
Start: 2023-04-19 — End: 2023-04-22
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
## 2023-04-25 ENCOUNTER — Encounter: Payer: Self-pay | Admitting: Neurology

## 2023-04-25 ENCOUNTER — Ambulatory Visit: Payer: Medicare PPO | Admitting: Neurology

## 2023-04-25 VITALS — BP 130/89 | HR 97 | Ht 65.0 in | Wt 168.8 lb

## 2023-04-25 DIAGNOSIS — G250 Essential tremor: Secondary | ICD-10-CM

## 2023-04-25 NOTE — Patient Instructions (Signed)
Continue primidone 50mg  at bedtime for now

## 2023-04-27 ENCOUNTER — Other Ambulatory Visit (INDEPENDENT_AMBULATORY_CARE_PROVIDER_SITE_OTHER): Payer: Medicare PPO

## 2023-04-27 DIAGNOSIS — E876 Hypokalemia: Secondary | ICD-10-CM

## 2023-04-27 LAB — COMPREHENSIVE METABOLIC PANEL WITH GFR
ALT: 36 U/L (ref 0–53)
AST: 28 U/L (ref 0–37)
Albumin: 3.7 g/dL (ref 3.5–5.2)
Alkaline Phosphatase: 41 U/L (ref 39–117)
BUN: 14 mg/dL (ref 6–23)
CO2: 25 meq/L (ref 19–32)
Calcium: 8.9 mg/dL (ref 8.4–10.5)
Chloride: 108 meq/L (ref 96–112)
Creatinine, Ser: 1.25 mg/dL (ref 0.40–1.50)
GFR: 57.65 mL/min — ABNORMAL LOW (ref >60.00)
Glucose, Bld: 143 mg/dL — ABNORMAL HIGH (ref 70–99)
Potassium: 3.7 meq/L (ref 3.5–5.1)
Sodium: 138 meq/L (ref 135–145)
Total Bilirubin: 0.4 mg/dL (ref 0.2–1.2)
Total Protein: 6 g/dL (ref 6.0–8.3)

## 2023-05-02 ENCOUNTER — Other Ambulatory Visit: Payer: Self-pay | Admitting: Family Medicine

## 2023-05-02 DIAGNOSIS — M5414 Radiculopathy, thoracic region: Secondary | ICD-10-CM | POA: Diagnosis not present

## 2023-05-02 DIAGNOSIS — M5416 Radiculopathy, lumbar region: Secondary | ICD-10-CM | POA: Diagnosis not present

## 2023-05-02 DIAGNOSIS — M546 Pain in thoracic spine: Secondary | ICD-10-CM | POA: Diagnosis not present

## 2023-05-02 DIAGNOSIS — F1120 Opioid dependence, uncomplicated: Secondary | ICD-10-CM | POA: Diagnosis not present

## 2023-05-05 ENCOUNTER — Other Ambulatory Visit: Payer: Self-pay | Admitting: Family Medicine

## 2023-05-06 ENCOUNTER — Ambulatory Visit: Payer: Medicare PPO | Admitting: Family Medicine

## 2023-05-11 ENCOUNTER — Ambulatory Visit: Payer: Medicare PPO | Attending: Medical | Admitting: Medical

## 2023-05-11 ENCOUNTER — Encounter: Payer: Self-pay | Admitting: Medical

## 2023-05-11 ENCOUNTER — Ambulatory Visit: Payer: Medicare PPO

## 2023-05-11 VITALS — BP 96/60 | HR 72 | Ht 65.0 in | Wt 166.0 lb

## 2023-05-11 DIAGNOSIS — E876 Hypokalemia: Secondary | ICD-10-CM | POA: Diagnosis not present

## 2023-05-11 DIAGNOSIS — I491 Atrial premature depolarization: Secondary | ICD-10-CM | POA: Diagnosis not present

## 2023-05-11 DIAGNOSIS — I493 Ventricular premature depolarization: Secondary | ICD-10-CM

## 2023-05-11 DIAGNOSIS — R55 Syncope and collapse: Secondary | ICD-10-CM

## 2023-05-11 DIAGNOSIS — N179 Acute kidney failure, unspecified: Secondary | ICD-10-CM | POA: Diagnosis not present

## 2023-05-11 DIAGNOSIS — I951 Orthostatic hypotension: Secondary | ICD-10-CM

## 2023-05-11 DIAGNOSIS — I5032 Chronic diastolic (congestive) heart failure: Secondary | ICD-10-CM

## 2023-05-11 NOTE — Progress Notes (Signed)
Cardiology Office Note:    Date:  05/11/2023   ID:  Justin TOPPI, DOB March 04, 1951, MRN [id number]  PCP:  [name], MD  [facility] Cardiologist:  None  [facility] Electrophysiologist:  None   Referring MD: [name], MD   Chief Complaint: 6 month follow-up  History of Present Illness:    Justin Patel is a 72 y.o. male with a hx of chronic diastolic heart failure, morbid obesity s/p gastric bypass, iron deficiency anemia, diabetes mellitus, HTN, HLD, anxiety, prior alcohol abuse, degenerative disc disease with previous back surgery and GERD.   Echo in 2020 showed LVEF 55-60%, no valvular abnormalities.   The patient was last seen 08/2022 and was overall stable from a cardiac perspective.   The patient was seen in there ER 03/14/23 for syncope. Work-up showed K3.3, Scr 1.73. EKG with NSR and PACs. Hs trop negative. Vitals were normal. Patient was discharged home without intervention.  Today, the patient reports he passed out after church. He is a Dance movement psychotherapist. When he passed out he was standing. He felt dizzy, lightheaded, hot. A witness helped him to the floor and he was unable to stand. He has lightheadedness for the last 5-6 months. It's worse when he stands up or walks. Also it occurs with turning his head fast. He denies chest pain, SOB, Lle, orthopnea or pnd. No palpitations.   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  removal scheduled to clear margins.   CHF (congestive heart failure) (HCC)    no longer after weight loss   Chronic kidney disease    cysts   Degenerative disc disease    Depression    Diabetes mellitus    no longer diabetic-or on meds   DJD (degenerative joint disease)    Dysplastic nevus 02/04/2016   R upper back paraspinal - severe   Family history of adverse reaction to  anesthesia    sons wake up combative   Foot fracture, left 01/2022   Gastric ulcer    GERD (gastroesophageal reflux disease)    H/O hiatal hernia    Headache(784.0)    sinus   Heart murmur    History of fusion of thoracic spine 12/28/2021   History of gastric bypass    Hx of congestive heart failure    Hyperlipidemia    Hypertension    Iron deficiency anemia 02/28/2015   Lumbar scoliosis    Opiate abuse, continuous (HCC) 06/20/2015   S/P laparoscopic cholecystectomy 02/18/2020   Sacral fracture, closed (HCC)    Seizures (HCC)    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, after GI bleed   Sleep apnea    hx not now since wt loss   Stones in the urinary tract    Syncope and collapse     Past Surgical History:  Procedure Laterality Date   ANTERIOR CERVICAL DECOMP/DISCECTOMY FUSION  01/26/2012   Procedure: ANTERIOR CERVICAL DECOMPRESSION/DISCECTOMY FUSION 3 LEVELS;  Surgeon: [name], MD;  Location: [facility];  Service: Neurosurgery;  Laterality: N/A;  Cervical three-four Cervical four-five Cervical five-six Cervical six-seven , Anterior cervical decompression/diskectomy, fusion, plate   ANTERIOR LAT LUMBAR FUSION Right 04/25/2018   Procedure: Right Lumbar two-three Lumbar three-four Lumbar four-five anterior  lateral interbody fusion  with posterior percutaneous pedicle screws;  Surgeon: [name], MD;  Location: [facility] OR;  Service: Neurosurgery;  Laterality: Right;   APPENDECTOMY     CARDIAC CATHETERIZATION     [facility], normal   CARDIAC CATHETERIZATION     [place]   CHOLECYSTECTOMY     COLONOSCOPY WITH PROPOFOL N/A 12/27/2017   Procedure: COLONOSCOPY WITH PROPOFOL;  Surgeon: Toledo, [name], MD;  Location: [facility];  Service: Gastroenterology;  Laterality: N/A;   ESOPHAGOGASTRODUODENOSCOPY (EGD) WITH PROPOFOL N/A 12/27/2017   Procedure: ESOPHAGOGASTRODUODENOSCOPY (EGD) WITH PROPOFOL;  Surgeon: Toledo, [name], MD;  Location: [facility];   Service: Gastroenterology;  Laterality: N/A;   ESOPHAGOGASTRODUODENOSCOPY (EGD) WITH PROPOFOL N/A 06/19/2021   Procedure: ESOPHAGOGASTRODUODENOSCOPY (EGD) WITH PROPOFOL;  Surgeon: [name], MD;  Location: [facility];  Service: Gastroenterology;  Laterality: N/A;   ESOPHAGOGASTRODUODENOSCOPY (EGD) WITH PROPOFOL N/A 09/25/2021   Procedure: ESOPHAGOGASTRODUODENOSCOPY (EGD) WITH PROPOFOL;  Surgeon: [name], MD;  Location: [facility]OSCOPY;  Service: Endoscopy;  Laterality: N/A;   GASTRIC BYPASS  08/09/2008   [facility]   HARDWARE REMOVAL N/A 03/08/2022   Procedure: Removal of bilateral Thoracic ten pedicle screws;  Surgeon: [name], MD;  Location: [facility] OR;  Service: Neurosurgery;  Laterality: N/A;   HERNIA REPAIR  08/09/1998   hiatal   JOINT REPLACEMENT     KNEE ARTHROSCOPY     bilateral, left x 2   LAMINECTOMY WITH POSTERIOR LATERAL ARTHRODESIS LEVEL 4 N/A 01/14/2020   Procedure: Decompressive Laminectomy Lumbar One with pedicle screw fixation from Thoracic Ten to Lumbar Two;  Surgeon: [name], MD;  Location: [facility] OR;  Service: Neurosurgery;  Laterality: N/A;  Decompressive Laminectomy Lumbar One with pedicle screw fixation from Thoracic Ten to Lumbar Two   LUMBAR PERCUTANEOUS PEDICLE SCREW 3 LEVEL N/A 04/25/2018   Procedure: LUMBAR PERCUTANEOUS PEDICLE SCREW 3 LEVEL;  Surgeon: [name], MD;  Location: [facility] OR;  Service: Neurosurgery;  Laterality: N/A;   NASAL SINUS SURGERY     x5   OSTEOTOMY  08/10/1999   left   ROUX-EN-Y GASTRIC BYPASS     THORACIC LAMINECTOMY FOR EPIDURAL ABSCESS N/A 03/28/2022   Procedure: THORACIC WOUND WASHOUT;  Surgeon: [name], MD;  Location: [facility] OR;  Service: Neurosurgery;  Laterality: N/A;   TONSILLECTOMY     TOTAL KNEE ARTHROPLASTY Left 05/09/2014   Dr. [name]   UVULOPALATOPLASTY  08/09/2009    Current Medications: Current Meds  Medication Sig   acetaminophen (TYLENOL) 325 MG tablet Take 650 mg by mouth every 6 (six)  hours as needed for moderate pain.   Calcium Carbonate-Vitamin D (CALCIUM 600/VITAMIN D PO) Take 1 tablet by mouth daily.   Cholecalciferol 50 MCG (2000 UT) TABS Take 2,000 Units by mouth daily.   cyclobenzaprine (FLEXERIL) 5 MG tablet TAKE 1 TABLET BY MOUTH EVERY 8 HOURS AS NEEDED FOR SPASMS. (Patient taking differently: Take 5 mg by mouth as needed for muscle spasms.)   esomeprazole (NEXIUM) 40 MG capsule Take 1 capsule (40 mg total) by mouth 2 (two) times daily before a meal.   ezetimibe (ZETIA) 10 MG tablet Take 1 tablet (10 mg total) by mouth daily.   gabapentin (NEURONTIN) 300 MG capsule TAKE 1 CAPSULE BY MOUTH EVERY MORNING AND TAKE 2 CAPSULES WITH LUNCH AND TAKE 1 CAPSULE AT BEDTIME   HYDROcodone-acetaminophen (NORCO) 7.5-325 MG tablet Take 1 tablet by mouth 4 (four) times daily as needed.   linagliptin (TRADJENTA) 5 MG TABS tablet Take 1 tablet (5 mg total) by mouth daily.   Multiple  Vitamin (MULTIVITAMIN) capsule Take 1 capsule by mouth daily. With copper and zinc (Patient taking differently: Take 1 capsule by mouth daily.)   ondansetron (ZOFRAN-ODT) 4 MG disintegrating tablet Allow 1-2 tablets to dissolve in your mouth every 8 hours as needed for nausea/vomiting   primidone (MYSOLINE) 50 MG tablet Take 1 tablet (50 mg total) by mouth at bedtime.   rosuvastatin (CRESTOR) 40 MG tablet Take 1 tablet (40 mg total) by mouth daily.   sucralfate (CARAFATE) 1 g tablet TAKE 1 TABLET BY MOUTH 4 TIMES DAILY   tamsulosin (FLOMAX) 0.4 MG CAPS capsule Take 2 capsules (0.8 mg total) by mouth daily.   venlafaxine XR (EFFEXOR-XR) 75 MG 24 hr capsule TAKE ONE CAPSULE EVERY MORNING WITH BREAKFAST (Patient taking differently: Take 75 mg by mouth daily.)   [DISCONTINUED] amLODipine (NORVASC) 5 MG tablet TAKE 1 TABLET BY MOUTH DAILY   [DISCONTINUED] furosemide (LASIX) 20 MG tablet Take 1 tablet (20 mg total) by mouth daily as needed for edema or fluid.   [DISCONTINUED] metoprolol succinate (TOPROL-XL) 50 MG  24 hr tablet Take 1 tablet (50 mg total) by mouth daily. Take with or immediately following a meal.     Allergies:   Altace [ramipril], Ace inhibitors, Levaquin [levofloxacin in d5w], Lyrica [pregabalin], Metformin, and Trulicity [dulaglutide]   Social History   Socioeconomic History   Marital status: Married    Spouse name: Not on file   Number of children: 2   Years of education: Not on file   Highest education level: Not on file  Occupational History   Not on file  Tobacco Use   Smoking status: Never   Smokeless tobacco: Never  Vaping Use   Vaping status: Never Used  Substance and Sexual Activity   Alcohol use: Yes    Alcohol/week: 21.0 standard drinks of alcohol    Types: 21 Glasses of wine per week    Comment: occassionally   Drug use: No   Sexual activity: Yes    Partners: Female  Other Topics Concern   Not on file  Social History Narrative   Lives in [place] with wife, [place]. 2 sons, Roderic Ovens, Caryn Bee 30.. 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      Diet - regular diet, limited quantities after gastric bypass      Exercise - no regular, limited by arthritis in knees, occasional water aerobics   Right handed   One story house   Social Determinants of Health   Financial Resource Strain: Low Risk  (07/06/2022)   Overall Financial Resource Strain (CARDIA)    Difficulty of Paying Living Expenses: Not hard at all  Food Insecurity: No Food Insecurity (11/26/2022)   Hunger Vital Sign    Worried About Running Out of Food in the Last Year: Never true    Ran Out of Food in the Last Year: Never true  Transportation Needs: No Transportation Needs (11/26/2022)   PRAPARE - Administrator, Civil Service (Medical): No    Lack of Transportation (Non-Medical): No  Physical Activity: Unknown (06/22/2021)   Exercise Vital Sign    Days of Exercise per Week: 0 days    Minutes of Exercise per Session: Not on file   Stress: No Stress Concern Present (07/06/2022)   [employer] of Occupational Health - Occupational Stress Questionnaire    Feeling of Stress : Not at all  Social Connections: Unknown (07/06/2022)   Social Connection  and Isolation Panel [NHANES]    Frequency of Communication with Friends and Family: Not on file    Frequency of Social Gatherings with Friends and Family: More than three times a week    Attends Religious Services: Not on Marketing executive or Organizations: Not on file    Attends Banker Meetings: Not on file    Marital Status: Married     Family History: The patient's family history includes Alcoholism in his maternal uncle; Dementia (age of onset: 1) in his mother; Diabetes in his father; Heart disease (age of onset: 26) in his father; Lung cancer in his paternal aunt, paternal aunt, paternal aunt, and paternal aunt; Lupus in his sister; Lymphoma in his sister; Mesothelioma in his cousin; Osteoporosis in his mother; Ovarian cancer (age of onset: 16) in his sister; Prostate cancer in his maternal uncle; Skin cancer in his maternal uncle; Tongue cancer in his maternal uncle.  ROS:   Please see the history of present illness.     All other systems reviewed and are negative.  EKGs/Labs/Other Studies Reviewed:    The following studies were reviewed today:  Echo 2020  1. The left ventricle has normal systolic function, with an ejection  fraction of 55-60%. The cavity size was normal. There is mildly increased  left ventricular wall thickness. Left ventricular diastolic Doppler  parameters are consistent with impaired  relaxation.   2. The right ventricle has normal systolic function. The cavity was  normal. There is no increase in right ventricular wall thickness. Right  ventricular systolic pressure could not be assessed.   3. The tricuspid valve is grossly normal.   4. The aortic valve is tricuspid.   5. The aortic root and ascending  aorta are normal in size and structure.   6. The interatrial septum was not well visualized.   EKG:  EKG is ordered today.  The ekg ordered today demonstrates NSR PAC, Pvc, 72bpm nonspecific T wave changes  Recent Labs: 11/20/2022: Magnesium 1.7 04/13/2023: Hemoglobin 13.9; Platelets 172.0 04/27/2023: ALT 36; BUN 14; Creatinine, Ser 1.25; Potassium 3.7; Sodium 138  Recent Lipid Panel    Component Value Date/Time   CHOL 144 08/11/2022 1415   CHOL 171 03/14/2019 0000   TRIG 81.0 08/11/2022 1415   HDL 64.20 08/11/2022 1415   HDL 64 03/14/2019 0000   CHOLHDL 2 08/11/2022 1415   VLDL 16.2 08/11/2022 1415   LDLCALC 64 08/11/2022 1415   LDLCALC 77 03/14/2019 0000   LDLDIRECT 70.0 05/27/2021 0948    Physical Exam:    VS:  BP 96/60 (BP Location: Left Arm, Patient Position: Sitting, Cuff Size: Normal)   Pulse 72   Ht 5\' 5"  (1.651 m)   Wt 166 lb (75.3 kg)   SpO2 96%   BMI 27.62 kg/m     Wt Readings from Last 3 Encounters:  05/11/23 166 lb (75.3 kg)  04/25/23 168 lb 12.8 oz (76.6 kg)  04/13/23 168 lb (76.2 kg)     GEN:  Well nourished, well developed in no acute distress HEENT: Normal NECK: No JVD; No carotid bruits LYMPHATICS: No lymphadenopathy CARDIAC: RRR, no murmurs, rubs, gallops RESPIRATORY:  Clear to auscultation without rales, wheezing or rhonchi  ABDOMEN: Soft, non-tender, non-distended MUSCULOSKELETAL:  No edema; No deformity  SKIN: Warm and dry NEUROLOGIC:  Alert and oriented x 3 PSYCHIATRIC:  Normal affect   ASSESSMENT:    1. Syncope, unspecified syncope type   2. Orthostatic hypotension  3. AKI (acute kidney injury) (HCC)   4. Hypokalemia   5. Premature atrial contractions   6. PVC's (premature ventricular contractions)   7. Chronic diastolic heart failure (HCC)    PLAN:    In order of problems listed above:  Syncope Orthostatic hypotension with h/o HTN AKI Hypokalemia Patient reports syncope episode last month while standing at church.  Patient  went to the ER and it showed hypokalemia, AKI, and EKG showed normal sinus rhythm with PACs. Patient was sent home without intervention. Since then, potassium and kidney function have been labile.  He is still taking lasix daily, he is not taking potassium. Patient reports lightheadedness for the last 6 months. He denies chest pain, palpitations, shortness of breath, lower leg edema.  Blood pressure today at baseline is low, 96/60. orthostatics are positive with blood pressure 52/42 at standing x 3 minutes.Marland Kitchen He is taking amlodipine 5 mg daily and Toprol 50 mg daily for blood pressure.  EKG today shows normal sinus rhythm with PACs and PVCs.  Most recent labs show K3.7 and improved kidney function.  I will stop Lasix, amlodipine and Toprol.  I will order a 2-week live heart monitor.  I will also check an echocardiogram and check mag and TSH. We will see him back in 6-8 weeks.   PACs/PVCs Plan to check heart monitor, Mag and TSH as above. Stop Toprol for orthostatic hypotension/hypotension.  Chronic diastolic heart failure Stop lasix as above. The patient appears euvolemic on exam. Echo in 2020 showed LVEF 55-60%, impaired relaxation. Repeat echo as above.   Disposition: Follow up in 6-8  week(s) with MD/APP    Signed, Holdyn Poyser David Stall, PA-C  05/11/2023 4:29 PM    [facility]

## 2023-05-11 NOTE — Patient Instructions (Signed)
Medication Instructions:   Your physician has recommended you make the following change in your medication:  STOP Amlodipine. STOP Furosemide. STOP Metoprolol Succinate. STOP Potassium.  *If you need a refill on your cardiac medications before your next appointment, please call your pharmacy*   Lab Work:  Your provider would like for you to have the following labs drawn: TODAY.  TSH/MAGNESIUM   Please go to [facility] [address] ([facility] [address], [place] [address] You do not need an appointment.  They are open from 7:30 am-4 pm.  Lunch from 1:00 pm- 2:00 pm   If you have labs (blood work) drawn today and your tests are completely normal, you will receive your results only by: MyChart Message (if you have MyChart) OR A paper copy in the mail If you have any lab test that is abnormal or we need to change your treatment, we will call you to review the results.   Testing/Procedures:  Your physician has requested that you have an echocardiogram. Echocardiography is a painless test that uses sound waves to create images of your heart. It provides your doctor with information about the size and shape of your heart and how well your heart's chambers and valves are working. This procedure takes approximately one hour. There are no restrictions for this procedure. Please do NOT wear cologne, perfume, aftershave, or lotions (deodorant is allowed). Please arrive 15 minutes prior to your appointment time.   Your physician has recommended that you wear a Live- [facility] monitor for 14 days.  This monitor is a medical device that records the heart's electrical activity. Doctors most often use these monitors to diagnose arrhythmias. Arrhythmias are problems with the speed or rhythm of the heartbeat. The monitor is a small device applied to your chest. You can wear one while you do your normal daily activities. While wearing this monitor if you have any symptoms to  push the button and record what you felt. Once you have worn this monitor for the period of time provider prescribed (Usually 14 days), you will return the monitor device in the postage paid box. Once it is returned they will download the data collected and provide [place] with a report which the provider will then review and we will call you with those results. Important tips:  Avoid showering during the first 24 hours of wearing the monitor. Avoid excessive sweating to help maximize wear time. Do not submerge the device, no hot tubs, and no swimming pools. Keep any lotions or oils away from the patch. After 24 hours you may shower with the patch on. Take brief showers with your back facing the shower head.  Do not remove patch once it has been placed because that will interrupt data and decrease adhesive wear time. Push the button when you have any symptoms and write down what you were feeling. Once you have completed wearing your monitor, remove and place into box which has postage paid and place in your outgoing mailbox.  If for some reason you have misplaced your box then call our office and we can provide another box and/or mail it off for you.      Follow-Up: At [facility], you and your health needs are our priority.  As part of our continuing mission to provide you with exceptional heart care, we have created designated Provider Care Teams.  These Care Teams include your primary Cardiologist (physician) and Advanced Practice Providers (APPs -  Physician Assistants and Nurse  Practitioners) who all work together to provide you with the care you need, when you need it.  We recommend signing up for the patient portal called "MyChart".  Sign up information is provided on this After Visit Summary.  MyChart is used to connect with patients for Virtual Visits (Telemedicine).  Patients are able to view lab/test results, encounter notes, upcoming appointments, etc.  Non-urgent messages can be  sent to your provider as well.   To learn more about what you can do with MyChart, go to [email or web address].    Your next appointment:   6-8  week(s)  Provider:   You may see  or one of the following Advanced Practice Providers on your designated Care Team:   Nicolasa Ducking, NP Eula Listen, PA-C Cadence Fransico Michael, PA-C Charlsie Quest, NP

## 2023-05-12 ENCOUNTER — Telehealth: Payer: Self-pay | Admitting: Cardiovascular Disease

## 2023-05-12 LAB — TSH: TSH: 2.04 u[IU]/mL (ref 0.450–4.500)

## 2023-05-12 LAB — MAGNESIUM: Magnesium: 2 mg/dL (ref 1.6–2.3)

## 2023-05-12 NOTE — Telephone Encounter (Signed)
Pt c/o medication issue:  1. Name of Medication: All medications   2. How are you currently taking this medication (dosage and times per day)?   3. Are you having a reaction (difficulty breathing--STAT)?   4. What is your medication issue? Patient was seen in office yesterday and was taken off of his medication due to lower readings and he now believes that this is due to taking medications twice yesterday by mistake. He believes he does not need to discontinue the medications he was requested to stop. Would like to speak to someone in regards to this.

## 2023-05-12 NOTE — Telephone Encounter (Signed)
Spoke w/ pt.  He reports that he initially thought he took his meds twice yesterday, but he was mistaken and only took them as directed, so he will follow Cadence's recommendations and d/c. Asked him to call back if we can be of further assistance.

## 2023-05-14 DIAGNOSIS — R55 Syncope and collapse: Secondary | ICD-10-CM

## 2023-05-15 DIAGNOSIS — R55 Syncope and collapse: Secondary | ICD-10-CM | POA: Diagnosis not present

## 2023-05-20 ENCOUNTER — Telehealth: Payer: Self-pay | Admitting: Medical

## 2023-05-20 NOTE — Telephone Encounter (Signed)
Pt c/o BP issue: STAT if pt c/o blurred vision, one-sided weakness or slurred speech  1. What are your last 5 BP readings? 186/120  2. Are you having any other symptoms (ex. Dizziness, headache, blurred vision, passed out)? No  3. What is your BP issue? Pt states that BP is elevated today after being taken off the following medications: Amlodipine and Metoprolol. Pt would like to be back on theses medications if possible. Please advise

## 2023-05-20 NOTE — Telephone Encounter (Signed)
I contacted patient, he states that during his last visit he was taken off a few medications (Metoprolol, Amlodipine) he states since that visit, he has been noticing his blood pressure going up and up. He states today he started having a headache, and checked the blood pressure it was 186/120 HR 114 on his home machine. He states he feels he should go back on medications, but wanted to check with PA first. I advised of any other symptoms patient denies. States he just has a headache and feels like medications should be taken. I advised I would call back with recommendations.   Thanks!

## 2023-05-23 DIAGNOSIS — M5414 Radiculopathy, thoracic region: Secondary | ICD-10-CM | POA: Diagnosis not present

## 2023-05-23 DIAGNOSIS — M546 Pain in thoracic spine: Secondary | ICD-10-CM | POA: Diagnosis not present

## 2023-05-25 MED ORDER — METOPROLOL SUCCINATE ER 25 MG PO TB24: 25.0000 mg | ORAL_TABLET | Freq: Every day | ORAL | Status: DC

## 2023-05-25 NOTE — Addendum Note (Signed)
Addended by: Darene Lamer T on: 05/25/2023 08:45 AM   Modules accepted: Orders

## 2023-05-30 ENCOUNTER — Inpatient Hospital Stay: Payer: Medicare PPO | Attending: Internal Medicine

## 2023-05-30 ENCOUNTER — Inpatient Hospital Stay: Payer: Medicare PPO | Admitting: Internal Medicine

## 2023-05-30 ENCOUNTER — Inpatient Hospital Stay: Payer: Medicare PPO

## 2023-05-30 ENCOUNTER — Encounter: Payer: Self-pay | Admitting: Internal Medicine

## 2023-05-30 VITALS — BP 114/69 | HR 77 | Temp 98.0°F | Ht 65.0 in | Wt 176.0 lb

## 2023-05-30 DIAGNOSIS — N183 Chronic kidney disease, stage 3 unspecified: Secondary | ICD-10-CM | POA: Insufficient documentation

## 2023-05-30 DIAGNOSIS — M810 Age-related osteoporosis without current pathological fracture: Secondary | ICD-10-CM

## 2023-05-30 DIAGNOSIS — E611 Iron deficiency: Secondary | ICD-10-CM

## 2023-05-30 DIAGNOSIS — E538 Deficiency of other specified B group vitamins: Secondary | ICD-10-CM | POA: Insufficient documentation

## 2023-05-30 DIAGNOSIS — M549 Dorsalgia, unspecified: Secondary | ICD-10-CM | POA: Insufficient documentation

## 2023-05-30 DIAGNOSIS — D509 Iron deficiency anemia, unspecified: Secondary | ICD-10-CM | POA: Insufficient documentation

## 2023-05-30 DIAGNOSIS — K909 Intestinal malabsorption, unspecified: Secondary | ICD-10-CM | POA: Diagnosis not present

## 2023-05-30 DIAGNOSIS — G8929 Other chronic pain: Secondary | ICD-10-CM | POA: Insufficient documentation

## 2023-05-30 LAB — BASIC METABOLIC PANEL
Anion gap: 8 (ref 5–15)
BUN: 18 mg/dL (ref 8–23)
CO2: 26 mmol/L (ref 22–32)
Calcium: 8 mg/dL — ABNORMAL LOW (ref 8.9–10.3)
Chloride: 100 mmol/L (ref 98–111)
Creatinine, Ser: 1.09 mg/dL (ref 0.61–1.24)
GFR, Estimated: >60 mL/min (ref >60)
Glucose, Bld: 124 mg/dL — ABNORMAL HIGH (ref 70–99)
Potassium: 4.1 mmol/L (ref 3.5–5.1)
Sodium: 134 mmol/L — ABNORMAL LOW (ref 135–145)

## 2023-05-30 LAB — CBC WITH DIFFERENTIAL (CANCER CENTER ONLY)
Abs Immature Granulocytes: 0.04 10*3/uL (ref 0.00–0.07)
Basophils Absolute: 0 10*3/uL (ref 0.0–0.1)
Basophils Relative: 1 %
Eosinophils Absolute: 0.1 10*3/uL (ref 0.0–0.5)
Eosinophils Relative: 1 %
HCT: 40.9 % (ref 39.0–52.0)
Hemoglobin: 13.3 g/dL (ref 13.0–17.0)
Immature Granulocytes: 1 %
Lymphocytes Relative: 26 %
Lymphs Abs: 1.7 10*3/uL (ref 0.7–4.0)
MCH: 35.1 pg — ABNORMAL HIGH (ref 26.0–34.0)
MCHC: 32.5 g/dL (ref 30.0–36.0)
MCV: 107.9 fL — ABNORMAL HIGH (ref 80.0–100.0)
Monocytes Absolute: 0.5 10*3/uL (ref 0.1–1.0)
Monocytes Relative: 8 %
Neutro Abs: 4.2 10*3/uL (ref 1.7–7.7)
Neutrophils Relative %: 63 %
Platelet Count: 211 10*3/uL (ref 150–400)
RBC: 3.79 MIL/uL — ABNORMAL LOW (ref 4.22–5.81)
RDW: 13.6 % (ref 11.5–15.5)
WBC Count: 6.6 10*3/uL (ref 4.0–10.5)
nRBC: 0 % (ref 0.0–0.2)

## 2023-05-30 LAB — VITAMIN B12: Vitamin B-12: 381 pg/mL (ref 180–914)

## 2023-05-30 MED ORDER — CYANOCOBALAMIN 1000 MCG/ML IJ SOLN
1000.0000 ug | Freq: Once | INTRAMUSCULAR | Status: AC
  Administered 2023-05-30: 1000 ug via INTRAMUSCULAR

## 2023-05-30 NOTE — Progress Notes (Signed)
Fatigue/weakness: yes, hardly any energy Dyspena: no  Light headedness: yes Blood in stool: no  Found out heart beats every third beat. Has passed out due to low BP. Having echo 06/06/23. Started amlodipine, unsure of dose.  MRI back [place] neuro[facility].

## 2023-05-30 NOTE — Progress Notes (Signed)
[facility] CONSULT NOTE  Patient Care Team: [name], MD as PCP - General (Family Medicine) Drema Dallas, DO as Consulting Physician (Neurology) Earna Coder, MD as Consulting Physician (Oncology)  CHIEF COMPLAINTS/PURPOSE OF CONSULTATION: ANEMIA   HEMATOLOGY HISTORY  # ANEMIA [Hb; MCV-platelets- WBC; Iron sat; ferritin;  GFR- CT/US; FEB 2023- EGD [[facility]]/- Evidence of a Roux-en-Y gastrojejunostomy was found. The gastrojejunal anastomosis was characterized by healthy appearing mucosa. The jejunojejunal anastomosis was characterized by healthy appearing mucosa. Previous gastric ulcer has healed. 2019-Colonoscopy   Latest Reference Range & Units 01/05/22 11:13  TIBC 250.0 - 450.0 mcg/dL 409.8 (H)  Saturation Ratios 20.0 - 50.0 % 10.3 (L)  Ferritin 22.0 - 322.0 ng/mL 12.5 (L)  Transferrin 212.0 - 360.0 mg/dL 119.1 (H)  (H): Data is abnormally high (L): Data is abnormally low  # Leucocytosis/weight loss/extreme fatigue-bone marrow biopsy-June 2019-unremarkable.   #Mild macrocytosis without anemia-question copper deficiency/gastric bypass; recommend p.o. copper.   #  SDH [[place]- 2015]; gastric Bypass Surgery    Latest Reference Range & Units 01/05/22 11:13  TIBC 250.0 - 450.0 mcg/dL 478.2 (H)  Saturation Ratios 20.0 - 50.0 % 10.3 (L)  Ferritin 22.0 - 322.0 ng/mL 12.5 (L)  Transferrin 212.0 - 360.0 mg/dL 956.2 (H)  (H): Data is abnormally high (L): Data is abnormally low   No history exists.    Latest Reference Range & Units 01/05/22 11:13  WBC 4.0 - 10.5 K/uL 8.4  RBC 4.22 - 5.81 Mil/uL 4.19 (L)  Hemoglobin 13.0 - 17.0 g/dL 13.0 (L)  HCT 86.5 - 78.4 % 38.3 (L)  MCV 78.0 - 100.0 fl 91.4  MCHC 30.0 - 36.0 g/dL 69.6  RDW 29.5 - 28.4 % 16.7 (H)  Platelets 150.0 - 400.0 K/uL 207.0  (L): Data is abnormally low (H): Data is abnormally high  HISTORY OF PRESENTING ILLNESS: Patient ambulating independently.  Alone.   Justin Patel 72  y.o.  male pleasant patient with a history of gastric bypass -anemia; chronic back pain s/p multiple surgeries osteoporosis is here for follow-up.  Patient complains of on going fatigue. Found out heart beats every third beat. Has passed out due to low BP. Awaiting results of holter monitor for how.   Having echo 06/06/23. Started amlodipine, unsure of dose. MRI back waiting [place] neurosurgery, Takes hydrocodone for chronic back pain. Pt in a pain clinic.   Pt feels fatigued.  Denies any blood in stools or black or stools.   Review of Systems  Constitutional:  Positive for malaise/fatigue. Negative for chills, diaphoresis, fever and weight loss.  HENT:  Negative for nosebleeds and sore throat.   Eyes:  Negative for double vision.  Respiratory:  Negative for cough, hemoptysis, sputum production, shortness of breath and wheezing.   Cardiovascular:  Negative for chest pain, palpitations, orthopnea and leg swelling.  Gastrointestinal:  Negative for abdominal pain, blood in stool, constipation, diarrhea, heartburn, melena, nausea and vomiting.  Genitourinary:  Negative for dysuria, frequency and urgency.  Musculoskeletal:  Positive for back pain and joint pain.  Skin: Negative.  Negative for itching and rash.  Neurological:  Negative for dizziness, tingling, focal weakness, weakness and headaches.  Endo/Heme/Allergies:  Does not bruise/bleed easily.  Psychiatric/Behavioral:  Negative for depression. The patient is not nervous/anxious and does not have insomnia.      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  removal scheduled to clear margins.   CHF (congestive heart failure) (HCC)    no longer after weight loss   Chronic kidney disease    cysts   Degenerative disc disease    Depression    Diabetes mellitus    no  longer diabetic-or on meds   DJD (degenerative joint disease)    Dysplastic nevus 02/04/2016   R upper back paraspinal - severe   Family history of adverse reaction to anesthesia    sons wake up combative   Foot fracture, left 01/2022   Gastric ulcer    GERD (gastroesophageal reflux disease)    H/O hiatal hernia    Headache(784.0)    sinus   Heart murmur    History of fusion of thoracic spine 12/28/2021   History of gastric bypass    Hx of congestive heart failure    Hyperlipidemia    Hypertension    Iron deficiency anemia 02/28/2015   Lumbar scoliosis    Opiate abuse, continuous (HCC) 06/20/2015   S/P laparoscopic cholecystectomy 02/18/2020   Sacral fracture, closed (HCC)    Seizures (HCC)    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, after GI bleed   Sleep apnea    hx not now since wt loss   Stones in the urinary tract    Syncope and collapse     SURGICAL HISTORY: Past Surgical History:  Procedure Laterality Date   ANTERIOR CERVICAL DECOMP/DISCECTOMY FUSION  01/26/2012   Procedure: ANTERIOR CERVICAL DECOMPRESSION/DISCECTOMY FUSION 3 LEVELS;  Surgeon: [name], MD;  Location: [facility];  Service: Neurosurgery;  Laterality: N/A;  Cervical three-four Cervical four-five Cervical five-six Cervical six-seven , Anterior cervical decompression/diskectomy, fusion, plate   ANTERIOR LAT LUMBAR FUSION Right 04/25/2018   Procedure: Right Lumbar two-three Lumbar three-four Lumbar four-five anterior  lateral interbody fusion  with posterior percutaneous pedicle screws;  Surgeon: [name], MD;  Location: [facility] OR;  Service: Neurosurgery;  Laterality: Right;   APPENDECTOMY     CARDIAC CATHETERIZATION     [facility], normal   CARDIAC CATHETERIZATION     [place]   CHOLECYSTECTOMY     COLONOSCOPY WITH PROPOFOL N/A 12/27/2017   Procedure: COLONOSCOPY WITH PROPOFOL;  Surgeon: Toledo, [name], MD;  Location: [facility] END[facility]COPY;  Service: Gastroenterology;  Laterality: N/A;    ESOPHAGOGASTRODUODENOSCOPY (EGD) WITH PROPOFOL N/A 12/27/2017   Procedure: ESOPHAGOGASTRODUODENOSCOPY (EGD) WITH PROPOFOL;  Surgeon: Toledo, [name], MD;  Location: [facility] ENDOSCOPY;  Service: Gastroenterology;  Laterality: N/A;   ESOPHAGOGASTRODUODENOSCOPY (EGD) WITH PROPOFOL N/A 06/19/2021   Procedure: ESOPHAGOGASTRODUODENOSCOPY (EGD) WITH PROPOFOL;  Surgeon: [name], MD;  Location: [facility] ENDOSCOPY;  Service: Gastroenterology;  Laterality: N/A;   ESOPHAGOGASTRODUODENOSCOPY (EGD) WITH PROPOFOL N/A 09/25/2021   Procedure: ESOPHAGOGASTRODUODENOSCOPY (EGD) WITH PROPOFOL;  Surgeon: [name], MD;  Location: [facility] ENDOSCOPY;  Service: Endoscopy;  Laterality: N/A;   GASTRIC BYPASS  08/09/2008   [employer] [facility]   HARDWARE REMOVAL N/A 03/08/2022   Procedure: Removal of bilateral Thoracic ten pedicle screws;  Surgeon: [name], MD;  Location: [facility] OR;  Service: Neurosurgery;  Laterality: N/A;   HERNIA REPAIR  08/09/1998   hiatal   JOINT REPLACEMENT     KNEE ARTHROSCOPY     bilateral, left x 2   LAMINECTOMY WITH POSTERIOR LATERAL ARTHRODESIS LEVEL 4 N/A 01/14/2020   Procedure: Decompressive Laminectomy Lumbar One with pedicle screw fixation from Thoracic Ten to  Lumbar Two;  Surgeon: [name], MD;  Location: [facility] OR;  Service: Neurosurgery;  Laterality: N/A;  Decompressive Laminectomy Lumbar One with pedicle screw fixation from Thoracic Ten to Lumbar Two   LUMBAR PERCUTANEOUS PEDICLE SCREW 3 LEVEL N/A 04/25/2018   Procedure: LUMBAR PERCUTANEOUS PEDICLE SCREW 3 LEVEL;  Surgeon: [name], MD;  Location: [facility] OR;  Service: Neurosurgery;  Laterality: N/A;   NASAL SINUS SURGERY     x5   OSTEOTOMY  08/10/1999   left   ROUX-EN-Y GASTRIC BYPASS     THORACIC LAMINECTOMY FOR EPIDURAL ABSCESS N/A 03/28/2022   Procedure: THORACIC WOUND WASHOUT;  Surgeon: [name], MD;  Location: [facility] OR;  Service: Neurosurgery;  Laterality: N/A;   TONSILLECTOMY     TOTAL KNEE ARTHROPLASTY Left  05/09/2014   Dr. [name]   UVULOPALATOPLASTY  08/09/2009    SOCIAL HISTORY: Social History   Socioeconomic History   Marital status: Married    Spouse name: Not on file   Number of children: 2   Years of education: Not on file   Highest education level: Not on file  Occupational History   Not on file  Tobacco Use   Smoking status: Never   Smokeless tobacco: Never  Vaping Use   Vaping status: Never Used  Substance and Sexual Activity   Alcohol use: Yes    Alcohol/week: 21.0 standard drinks of alcohol    Types: 21 Glasses of wine per week    Comment: occassionally   Drug use: No   Sexual activity: Yes    Partners: Female  Other Topics Concern   Not on file  Social History Narrative   Lives in [place] with wife, [place]. 2 sons, Roderic Ovens, Caryn Bee 30.. 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      Diet - regular diet, limited quantities after gastric bypass      Exercise - no regular, limited by arthritis in knees, occasional water aerobics   Right handed   One story house   Social Determinants of Health   Financial Resource Strain: Low Risk  (07/06/2022)   Overall Financial Resource Strain (CARDIA)    Difficulty of Paying Living Expenses: Not hard at all  Food Insecurity: No Food Insecurity (11/26/2022)   Hunger Vital Sign    Worried About Running Out of Food in the Last Year: Never true    Ran Out of Food in the Last Year: Never true  Transportation Needs: No Transportation Needs (11/26/2022)   PRAPARE - Administrator, Civil Service (Medical): No    Lack of Transportation (Non-Medical): No  Physical Activity: Unknown (06/22/2021)   Exercise Vital Sign    Days of Exercise per Week: 0 days    Minutes of Exercise per Session: Not on file  Stress: No Stress Concern Present (07/06/2022)   [employer] of Occupational Health - Occupational Stress Questionnaire    Feeling of Stress : Not at all  Social  Connections: Unknown (07/06/2022)   Social Connection and Isolation Panel [NHANES]    Frequency of Communication with Friends and Family: Not on file    Frequency of Social Gatherings with Friends and Family: More than three times a week    Attends Religious Services: Not on file    Active Member of Clubs or Organizations: Not on file    Attends Banker Meetings: Not on file    Marital Status: Married  Catering manager  Violence: Unknown (11/19/2022)   Humiliation, Afraid, Rape, and Kick questionnaire    Fear of Current or Ex-Partner: No    Emotionally Abused: No    Physically Abused: No    Sexually Abused: Patient declined    FAMILY HISTORY: Family History  Problem Relation Age of Onset   Dementia Mother 41   Osteoporosis Mother    Heart disease Father 15   Diabetes Father    Lymphoma Sister        lymphoma, stage 4   Ovarian cancer Sister 22       Ovarian   Lupus Sister    Prostate cancer Maternal Uncle    Skin cancer Maternal Uncle    Tongue cancer Maternal Uncle        uncle died of heart attack   Alcoholism Maternal Uncle    Lung cancer Paternal Aunt        pat aunts x 4 died of lung cancer   Lung cancer Paternal Aunt    Lung cancer Paternal Aunt    Lung cancer Paternal Aunt    Mesothelioma Cousin        paternal cousin    ALLERGIES:  is allergic to altace [ramipril], ace inhibitors, levaquin [levofloxacin in d5w], lyrica [pregabalin], metformin, and trulicity [dulaglutide].  MEDICATIONS:  Current Outpatient Medications  Medication Sig Dispense Refill   acetaminophen (TYLENOL) 325 MG tablet Take 650 mg by mouth every 6 (six) hours as needed for moderate pain.     amLODIPine-Valsartan-HCTZ 5-160-25 MG TABS Take by mouth.     Calcium Carbonate-Vitamin D (CALCIUM 600/VITAMIN D PO) Take 1 tablet by mouth daily.     Cholecalciferol 50 MCG (2000 UT) TABS Take 2,000 Units by mouth daily.     cyclobenzaprine (FLEXERIL) 5 MG tablet TAKE 1 TABLET BY MOUTH  EVERY 8 HOURS AS NEEDED FOR SPASMS. (Patient taking differently: Take 5 mg by mouth as needed for muscle spasms.) 30 tablet 1   esomeprazole (NEXIUM) 40 MG capsule Take 1 capsule (40 mg total) by mouth 2 (two) times daily before a meal. 180 capsule 1   ezetimibe (ZETIA) 10 MG tablet Take 1 tablet (10 mg total) by mouth daily. 90 tablet 3   gabapentin (NEURONTIN) 300 MG capsule TAKE 1 CAPSULE BY MOUTH EVERY MORNING AND TAKE 2 CAPSULES WITH LUNCH AND TAKE 1 CAPSULE AT BEDTIME 360 capsule 1   linagliptin (TRADJENTA) 5 MG TABS tablet Take 1 tablet (5 mg total) by mouth daily. 90 tablet 1   metoprolol succinate (TOPROL XL) 25 MG 24 hr tablet Take 1 tablet (25 mg total) by mouth daily.     Multiple Vitamin (MULTIVITAMIN) capsule Take 1 capsule by mouth daily. With copper and zinc (Patient taking differently: Take 1 capsule by mouth daily.)     ondansetron (ZOFRAN-ODT) 4 MG disintegrating tablet Allow 1-2 tablets to dissolve in your mouth every 8 hours as needed for nausea/vomiting 30 tablet 0   primidone (MYSOLINE) 50 MG tablet Take 1 tablet (50 mg total) by mouth at bedtime. 30 tablet 5   rosuvastatin (CRESTOR) 40 MG tablet Take 1 tablet (40 mg total) by mouth daily. 90 tablet 1   sucralfate (CARAFATE) 1 g tablet TAKE 1 TABLET BY MOUTH 4 TIMES DAILY 360 tablet 1   tamsulosin (FLOMAX) 0.4 MG CAPS capsule Take 2 capsules (0.8 mg total) by mouth daily. 180 capsule 1   venlafaxine XR (EFFEXOR-XR) 75 MG 24 hr capsule TAKE ONE CAPSULE EVERY MORNING WITH BREAKFAST (Patient taking differently: Take  75 mg by mouth daily.) 90 capsule 1   HYDROcodone-acetaminophen (NORCO) 7.5-325 MG tablet Take 1 tablet by mouth 4 (four) times daily as needed. (Patient not taking: Reported on 05/30/2023)     No current facility-administered medications for this visit.     Marland Kitchen  PHYSICAL EXAMINATION:   Vitals:   05/30/23 1524  BP: 114/69  Pulse: 77  Temp: 98 F (36.7 C)  SpO2: 99%    Filed Weights   05/30/23 1524   Weight: 176 lb (79.8 kg)     Physical Exam Vitals and nursing note reviewed.  HENT:     Head: Normocephalic and atraumatic.     Mouth/Throat:     Pharynx: Oropharynx is clear.  Eyes:     Extraocular Movements: Extraocular movements intact.     Pupils: Pupils are equal, round, and reactive to light.  Cardiovascular:     Rate and Rhythm: Normal rate and regular rhythm.  Pulmonary:     Comments: Decreased breath sounds bilaterally.  Abdominal:     Palpations: Abdomen is soft.  Musculoskeletal:        General: Normal range of motion.     Cervical back: Normal range of motion.  Skin:    General: Skin is warm.  Neurological:     General: No focal deficit present.     Mental Status: He is alert and oriented to person, place, and time.  Psychiatric:        Behavior: Behavior normal.        Judgment: Judgment normal.      LABORATORY DATA:  I have reviewed the data as listed Lab Results  Component Value Date   WBC 6.6 05/30/2023   HGB 13.3 05/30/2023   HCT 40.9 05/30/2023   MCV 107.9 (H) 05/30/2023   PLT 211 05/30/2023   Recent Labs    12/22/22 1559 01/26/23 1449 02/07/23 2104 03/13/23 2005 03/15/23 1149 04/13/23 1500 04/27/23 1342 05/30/23 1453  NA 143   < > 133* 141   < > 140 138 134*  K 3.8   < > 2.9* 3.3*   < > 3.2* 3.7 4.1  CL 104   < > 101 107   < > 108 108 100  CO2 30   < > 17* 23   < > 25 25 26   GLUCOSE 137*   < > 159* 120*   < > 86 143* 124*  BUN 20   < > 16 20   < > 15 14 18   CREATININE 1.37   < > 1.37* 1.73*   < > 1.29 1.25 1.09  CALCIUM 8.9   < > 9.2 8.4*   < > 8.4 8.9 8.0*  GFRNONAA  --    < > 55* 41*  --   --   --  >60  PROT 5.9*  --   --   --   --  5.9* 6.0  --   ALBUMIN 3.6  --   --   --   --  3.7 3.7  --   AST 24  --   --   --   --  44* 28  --   ALT 53  --   --   --   --  53 36  --   ALKPHOS 44  --   --   --   --  47 41  --   BILITOT 0.2  --   --   --   --  0.3 0.4  --    < > =  values in this interval not displayed.     No results  found.  ASSESSMENT & PLAN:   Iron deficiency # Iron deficiency: sec to gastric bypass. FEB 2024-  Iron sat- 38%; ferritin-180. [PCP; Hb12]-likely secondary to malabsorption. Question low-grade MDS-but bone marrow biopsy would be recommended to confirm.  As long patient does not have worsening anemia-I would recommend continued surveillance without a bone marrow.    # Currently on maintenance iron infusion/ venofer.  Today hemoglobin is 13.  Stable.  Hold Venofer today.  # B 12 def [JUNE 2024-]- 233- s/p B12 shot- nov 2023; proceed with B12 shot every 3-4 months.  B12 shot today.  # Chronic back pain/osteoporosis [Dr.Poole;neurosurg]-status post surgery clinically stable.vit D NOV 2023- 43. Reclast [[facility]; endocrinology]  # CKD stage III [Dr.Singh]- stable.   # fatigue: Hx of sleep apnea [before gastric by pass]- ? Depression [limited activities/chronic pain ]  # DISPOSITION:  # HOLD venofer today; ok B12 shot injection # follow up in 4 months- MD; labs- cbc/bmp; vit D 25-OH; B12 levels-possible IV venofer; B12 injection-Dr.B    All questions were answered. The patient knows to call the clinic with any problems, questions or concerns.    Earna Coder, MD 05/30/2023 3:56 PM

## 2023-05-30 NOTE — Assessment & Plan Note (Addendum)
#   Iron deficiency: sec to gastric bypass. FEB 2024-  Iron sat- 38%; ferritin-180. [PCP; Hb12]-likely secondary to malabsorption. Question low-grade MDS-but bone marrow biopsy would be recommended to confirm.  As long patient does not have worsening anemia-I would recommend continued surveillance without a bone marrow.    # Currently on maintenance iron infusion/ venofer.  Today hemoglobin is 13.  Stable.  Hold Venofer today.  # B 12 def [JUNE 2024-]- 233- s/p B12 shot- nov 2023; proceed with B12 shot every 3-4 months.  B12 shot today.  # Chronic back pain/osteoporosis [Dr.Poole;neurosurg]-status post surgery clinically stable.vit D NOV 2023- 43. Reclast [[facility]; endocrinology]  # CKD stage III [Dr.Singh]- stable.   # fatigue: Hx of sleep apnea [before gastric by pass]- ? Depression [limited activities/chronic pain ]  # DISPOSITION:  # HOLD venofer today; ok B12 shot injection # follow up in 4 months- MD; labs- cbc/bmp; vit D 25-OH; B12 levels-possible IV venofer; B12 injection-Dr.B

## 2023-06-06 ENCOUNTER — Ambulatory Visit: Payer: Medicare PPO | Attending: Medical

## 2023-06-06 DIAGNOSIS — R55 Syncope and collapse: Secondary | ICD-10-CM | POA: Diagnosis not present

## 2023-06-06 DIAGNOSIS — I5030 Unspecified diastolic (congestive) heart failure: Secondary | ICD-10-CM

## 2023-06-06 DIAGNOSIS — I517 Cardiomegaly: Secondary | ICD-10-CM | POA: Diagnosis not present

## 2023-06-06 LAB — ECHOCARDIOGRAM COMPLETE: Area-P 1/2: 2.87 cm2

## 2023-06-20 ENCOUNTER — Ambulatory Visit: Payer: Medicare PPO | Admitting: Dermatology

## 2023-06-23 ENCOUNTER — Ambulatory Visit: Payer: Medicare PPO | Admitting: Dermatology

## 2023-06-25 ENCOUNTER — Other Ambulatory Visit: Payer: Self-pay | Admitting: Family Medicine

## 2023-06-28 ENCOUNTER — Ambulatory Visit: Payer: Medicare PPO | Attending: Medical | Admitting: Medical

## 2023-06-28 ENCOUNTER — Encounter: Payer: Self-pay | Admitting: Medical

## 2023-06-28 VITALS — BP 127/82 | HR 95 | Ht 65.0 in | Wt 170.0 lb

## 2023-06-28 DIAGNOSIS — I493 Ventricular premature depolarization: Secondary | ICD-10-CM

## 2023-06-28 DIAGNOSIS — I951 Orthostatic hypotension: Secondary | ICD-10-CM

## 2023-06-28 DIAGNOSIS — R55 Syncope and collapse: Secondary | ICD-10-CM

## 2023-06-28 DIAGNOSIS — I491 Atrial premature depolarization: Secondary | ICD-10-CM | POA: Diagnosis not present

## 2023-06-28 DIAGNOSIS — I5032 Chronic diastolic (congestive) heart failure: Secondary | ICD-10-CM

## 2023-06-28 NOTE — Progress Notes (Unsigned)
Cardiology Office Note:    Date:  06/28/2023   ID:  Justin Patel, DOB 17-Dec-1950, MRN [id number]  PCP:  [name], MD  [facility] Cardiologist:  Lorine Bears, MD  [facility] Electrophysiologist:  None   Referring MD: [name], MD   Chief Complaint: testing follow-up  History of Present Illness:    Justin Patel is a 72 y.o. male with a hx of  chronic diastolic heart failure, morbid obesity s/p gastric bypass 2010, iron deficiency anemia, diabetes mellitus, HTN, HLD, anxiety, prior alcohol abuse, degenerative disc disease with previous back surgery and GERD.    Echo in 2020 showed LVEF 55-60%, no valvular abnormalities.    The patient was seen in there ER 03/14/23 for syncope. Work-up showed K3.3, Scr 1.73. EKG with NSR and PACs. Hs trop negative. Vitals were normal. Patient was discharged home without intervention.  Patient was seen October 2024 after syncope episode.  Orthostatics were positive.  EKG showed normal sinus rhythm with PACs and PVCs.  Lasix, amlodipine and Toprol were stopped.  Heart monitor and echocardiogram ordered.  Today, the heart monitor and echo were reviewed. He denies any symptoms during the heart monitor. He denies any chest pain, SOB, lower leg edema, orthopnea or pnd. No other cardiac symptoms. He is back on his BP medications without any issues.    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  removal scheduled to clear margins.   CHF (congestive heart failure) (HCC)    no longer after weight loss   Chronic kidney disease    cysts   Degenerative disc disease    Depression    Diabetes mellitus    no longer diabetic-or on meds   DJD (degenerative joint disease)    Dysplastic nevus 02/04/2016   R upper back paraspinal - severe   Family history of adverse reaction to  anesthesia    sons wake up combative   Foot fracture, left 01/2022   Gastric ulcer    GERD (gastroesophageal reflux disease)    H/O hiatal hernia    Headache(784.0)    sinus   Heart murmur    History of fusion of thoracic spine 12/28/2021   History of gastric bypass    Hx of congestive heart failure    Hyperlipidemia    Hypertension    Iron deficiency anemia 02/28/2015   Lumbar scoliosis    Opiate abuse, continuous (HCC) 06/20/2015   S/P laparoscopic cholecystectomy 02/18/2020   Sacral fracture, closed (HCC)    Seizures (HCC)    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, after GI bleed   Sleep apnea    hx not now since wt loss   Stones in the urinary tract    Syncope and collapse     Past Surgical History:  Procedure Laterality Date   ANTERIOR CERVICAL DECOMP/DISCECTOMY FUSION  01/26/2012   Procedure: ANTERIOR CERVICAL DECOMPRESSION/DISCECTOMY FUSION 3 LEVELS;  Surgeon: [name], MD;  Location: [facility];  Service: Neurosurgery;  Laterality: N/A;  Cervical three-four Cervical four-five Cervical five-six Cervical six-seven , Anterior cervical decompression/diskectomy, fusion, plate   ANTERIOR LAT LUMBAR FUSION Right 04/25/2018   Procedure: Right Lumbar two-three Lumbar three-four Lumbar four-five anterior  lateral interbody fusion  with posterior percutaneous pedicle screws;  Surgeon: [name], MD;  Location: [facility] OR;  Service: Neurosurgery;  Laterality: Right;   APPENDECTOMY     CARDIAC CATHETERIZATION     [facility], normal   CARDIAC CATHETERIZATION     [place]   CHOLECYSTECTOMY     COLONOSCOPY WITH PROPOFOL N/A 12/27/2017   Procedure: COLONOSCOPY WITH PROPOFOL;  Surgeon: Toledo, [name], MD;  Location: [facility] ENDOSCOPY;  Service: Gastroenterology;  Laterality: N/A;   ESOPHAGOGASTRODUODENOSCOPY (EGD) WITH PROPOFOL N/A 12/27/2017   Procedure: ESOPHAGOGASTRODUODENOSCOPY (EGD) WITH PROPOFOL;  Surgeon: Toledo, [name], MD;  Location: [facility] ENDOSCOPY;   Service: Gastroenterology;  Laterality: N/A;   ESOPHAGOGASTRODUODENOSCOPY (EGD) WITH PROPOFOL N/A 06/19/2021   Procedure: ESOPHAGOGASTRODUODENOSCOPY (EGD) WITH PROPOFOL;  Surgeon: [name], MD;  Location: [facility] ENDOSCOPY;  Service: Gastroenterology;  Laterality: N/A;   ESOPHAGOGASTRODUODENOSCOPY (EGD) WITH PROPOFOL N/A 09/25/2021   Procedure: ESOPHAGOGASTRODUODENOSCOPY (EGD) WITH PROPOFOL;  Surgeon: [name], MD;  Location: [facility] ENDOSCOPY;  Service: Endoscopy;  Laterality: N/A;   GASTRIC BYPASS  08/09/2008   [facility]   HARDWARE REMOVAL N/A 03/08/2022   Procedure: Removal of bilateral Thoracic ten pedicle screws;  Surgeon: [name], MD;  Location: [facility] OR;  Service: Neurosurgery;  Laterality: N/A;   HERNIA REPAIR  08/09/1998   hiatal   JOINT REPLACEMENT     KNEE ARTHROSCOPY     bilateral, left x 2   LAMINECTOMY WITH POSTERIOR LATERAL ARTHRODESIS LEVEL 4 N/A 01/14/2020   Procedure: Decompressive Laminectomy Lumbar One with pedicle screw fixation from Thoracic Ten to Lumbar Two;  Surgeon: [name], MD;  Location: [facility] OR;  Service: Neurosurgery;  Laterality: N/A;  Decompressive Laminectomy Lumbar One with pedicle screw fixation from Thoracic Ten to Lumbar Two   LUMBAR PERCUTANEOUS PEDICLE SCREW 3 LEVEL N/A 04/25/2018   Procedure: LUMBAR PERCUTANEOUS PEDICLE SCREW 3 LEVEL;  Surgeon: [name], MD;  Location[facility];  Service: Neurosurgery;  Laterality: N/A;   NASAL SINUS SURGERY     x5   OSTEOTOMY  08/10/1999   left   ROUX-EN-Y GASTRIC BYPASS     THORACIC LAMINECTOMY FOR EPIDURAL ABSCESS N/A 03/28/2022   Procedure: THORACIC WOUND WASHOUT;  Surgeon: [name], MD;  Location: [facility] OR;  Service: Neurosurgery;  Laterality: N/A;   TONSILLECTOMY     TOTAL KNEE ARTHROPLASTY Left 05/09/2014   Dr. [name]   UVULOPALATOPLASTY  08/09/2009    Current Medications: Current Meds  Medication Sig   acetaminophen (TYLENOL) 325 MG tablet Take 650 mg by mouth every 6 (six)  hours as needed for moderate pain.   amLODIPine-Valsartan-HCTZ 5-160-25 MG TABS Take by mouth.   Calcium Carbonate-Vitamin D (CALCIUM 600/VITAMIN D PO) Take 1 tablet by mouth daily.   Cholecalciferol 50 MCG (2000 UT) TABS Take 2,000 Units by mouth daily.   esomeprazole (NEXIUM) 40 MG capsule Take 1 capsule (40 mg total) by mouth 2 (two) times daily before a meal.   ezetimibe (ZETIA) 10 MG tablet Take 1 tablet (10 mg total) by mouth daily.   gabapentin (NEURONTIN) 300 MG capsule TAKE 1 CAPSULE BY MOUTH EVERY MORNING AND TAKE 2 CAPSULES WITH LUNCH AND TAKE 1 CAPSULE AT BEDTIME   HYDROcodone-acetaminophen (NORCO) 7.5-325 MG tablet Take 1 tablet by mouth 4 (four) times daily as needed.   linagliptin (TRADJENTA) 5 MG TABS tablet Take 1 tablet (5 mg total) by mouth daily.   metoprolol succinate (TOPROL XL) 25 MG 24 hr tablet Take 1 tablet (25 mg total) by mouth daily.   Multiple Vitamin (MULTIVITAMIN) capsule Take 1 capsule by mouth  daily. With copper and zinc (Patient taking differently: Take 1 capsule by mouth daily.)   ondansetron (ZOFRAN-ODT) 4 MG disintegrating tablet Allow 1-2 tablets to dissolve in your mouth every 8 hours as needed for nausea/vomiting   primidone (MYSOLINE) 50 MG tablet Take 1 tablet (50 mg total) by mouth at bedtime.   rosuvastatin (CRESTOR) 40 MG tablet Take 1 tablet (40 mg total) by mouth daily.   sucralfate (CARAFATE) 1 g tablet TAKE 1 TABLET BY MOUTH 4 TIMES DAILY   tamsulosin (FLOMAX) 0.4 MG CAPS capsule Take 2 capsules (0.8 mg total) by mouth daily.   venlafaxine XR (EFFEXOR-XR) 75 MG 24 hr capsule TAKE ONE CAPSULE EVERY MORNING WITH BREAKFAST     Allergies:   Altace [ramipril], Ace inhibitors, Levaquin [levofloxacin in d5w], Lyrica [pregabalin], Metformin, and Trulicity [dulaglutide]   Social History   Socioeconomic History   Marital status: Married    Spouse name: Not on file   Number of children: 2   Years of education: Not on file   Highest education level:  Not on file  Occupational History   Not on file  Tobacco Use   Smoking status: Never   Smokeless tobacco: Never  Vaping Use   Vaping status: Never Used  Substance and Sexual Activity   Alcohol use: Yes    Alcohol/week: 21.0 standard drinks of alcohol    Types: 21 Glasses of wine per week    Comment: occassionally   Drug use: No   Sexual activity: Yes    Partners: Female  Other Topics Concern   Not on file  Social History Narrative   Lives in [place] with wife, [place]. 2 sons, Roderic Ovens, Caryn Bee 30.. 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      Diet - regular diet, limited quantities after gastric bypass      Exercise - no regular, limited by arthritis in knees, occasional water aerobics   Right handed   One story house   Social Determinants of Health   Financial Resource Strain: Low Risk  (07/06/2022)   Overall Financial Resource Strain (CARDIA)    Difficulty of Paying Living Expenses: Not hard at all  Food Insecurity: No Food Insecurity (11/26/2022)   Hunger Vital Sign    Worried About Running Out of Food in the Last Year: Never true    Ran Out of Food in the Last Year: Never true  Transportation Needs: No Transportation Needs (11/26/2022)   PRAPARE - Administrator, Civil Service (Medical): No    Lack of Transportation (Non-Medical): No  Physical Activity: Unknown (06/22/2021)   Exercise Vital Sign    Days of Exercise per Week: 0 days    Minutes of Exercise per Session: Not on file  Stress: No Stress Concern Present (07/06/2022)   [employer] of Occupational Health - Occupational Stress Questionnaire    Feeling of Stress : Not at all  Social Connections: Unknown (07/06/2022)   Social Connection and Isolation Panel [NHANES]    Frequency of Communication with Friends and Family: Not on file    Frequency of Social Gatherings with Friends and Family: More than three times a week    Attends Religious  Services: Not on file    Active Member of Clubs or Organizations: Not on file    Attends Banker Meetings: Not on file    Marital Status: Married     Family History: The patient's family  history includes Alcoholism in his maternal uncle; Dementia (age of onset: 46) in his mother; Diabetes in his father; Heart disease (age of onset: 29) in his father; Lung cancer in his paternal aunt, paternal aunt, paternal aunt, and paternal aunt; Lupus in his sister; Lymphoma in his sister; Mesothelioma in his cousin; Osteoporosis in his mother; Ovarian cancer (age of onset: 46) in his sister; Prostate cancer in his maternal uncle; Skin cancer in his maternal uncle; Tongue cancer in his maternal uncle.  ROS:   Please see the history of present illness.     All other systems reviewed and are negative.  EKGs/Labs/Other Studies Reviewed:    The following studies were reviewed today:  Heart monitor 05/2023 Patch Wear Time:  13 days and 21 hours (2024-10-05T11:21:07-399 to 2024-10-19T09:01:40-0400)   Patient had a min HR of 47 bpm, max HR of 214 bpm, and avg HR of 73 bpm. Predominant underlying rhythm was Sinus Rhythm. 1 run of Ventricular Tachycardia occurred lasting 4 beats with a max rate of 194 bpm (avg 169 bpm). 62 Supraventricular Tachycardia  runs occurred, the run with the fastest interval lasting 29.9 secs with a max rate of 214 bpm (avg 166 bpm); the run with the fastest interval was also the longest. Isolated SVEs were rare (<1.0%), SVE Couplets were rare (<1.0%), and SVE Triplets were  rare (<1.0%). Isolated VEs were occasional (1.7%, 24606), VE Couplets were rare (<1.0%, 122), and VE Triplets were rare (<1.0%, 5). Ventricular Bigeminy and Trigeminy were present.   Echo 05/2023 1. Left ventricular ejection fraction, by estimation, is 60 to 65%. The  left ventricle has normal function. The left ventricle has no regional  wall motion abnormalities. There is mild left ventricular  hypertrophy.  Left ventricular diastolic parameters  are consistent with Grade I diastolic dysfunction (impaired relaxation).  The average left ventricular global longitudinal strain is -18.7 %. The  global longitudinal strain is normal.   2. Right ventricular systolic function is normal. The right ventricular  size is normal.   3. The mitral valve is normal in structure. No evidence of mitral valve  regurgitation.   4. The aortic valve is tricuspid. Aortic valve regurgitation is not  visualized.   EKG:  EKG is not ordered today.   Recent Labs: 04/27/2023: ALT 36 05/11/2023: Magnesium 2.0; TSH 2.040 05/30/2023: BUN 18; Creatinine, Ser 1.09; Hemoglobin 13.3; Platelet Count 211; Potassium 4.1; Sodium 134  Recent Lipid Panel    Component Value Date/Time   CHOL 144 08/11/2022 1415   CHOL 171 03/14/2019 0000   TRIG 81.0 08/11/2022 1415   HDL 64.20 08/11/2022 1415   HDL 64 03/14/2019 0000   CHOLHDL 2 08/11/2022 1415   VLDL 16.2 08/11/2022 1415   LDLCALC 64 08/11/2022 1415   LDLCALC 77 03/14/2019 0000   LDLDIRECT 70.0 05/27/2021 0948   Physical Exam:    VS:  BP 127/82 (BP Location: Left Arm, Patient Position: Sitting, Cuff Size: Normal)   Pulse 95   Ht 5\' 5"  (1.651 m)   Wt 170 lb (77.1 kg)   SpO2 97%   BMI 28.29 kg/m     Wt Readings from Last 3 Encounters:  06/28/23 170 lb (77.1 kg)  05/30/23 176 lb (79.8 kg)  05/11/23 166 lb (75.3 kg)     GEN:  Well nourished, well developed in no acute distress HEENT: Normal NECK: No JVD; No carotid bruits LYMPHATICS: No lymphadenopathy CARDIAC: RRR, no murmurs, rubs, gallops RESPIRATORY:  Clear to auscultation without rales, wheezing or  rhonchi  ABDOMEN: Soft, non-tender, non-distended MUSCULOSKELETAL:  No edema; No deformity  SKIN: Warm and dry NEUROLOGIC:  Alert and oriented x 3 PSYCHIATRIC:  Normal affect   ASSESSMENT:    1. Syncope, unspecified syncope type   2. Orthostatic hypotension   3. Premature atrial contractions    4. PVC's (premature ventricular contractions)   5. Chronic diastolic heart failure (HCC)    PLAN:    In order of problems listed above:  Syncope suspected 2/2 orthostatic hypotension Heart monitor showed NSR with 62 runs of SVT, fastest 29.9 seconds, occasional PVCs, 1 run if SVT lasting 4 beats. Echo showed normal LVEF with G1DD. He denied any symptoms during the heart monitor. He is back on BP medications and denies any pre-syncope or syncope. BP today is normal. No further work-up at this time.   PAC/PVCs Occasional ectopy on heart monitor. Continue Toprol  Chronic diastolic heart failure The patient is euvolemic. Echo showed normal LVEF with G1DD.   Disposition: Follow up in 6 month(s) with MD/APP    Signed, Alaynah Schutter David Stall, PA-C  06/28/2023 4:19 PM    [facility]

## 2023-06-28 NOTE — Patient Instructions (Signed)
 Medication Instructions:  Your physician recommends that you continue on your current medications as directed. Please refer to the Current Medication list given to you today.   *If you need a refill on your cardiac medications before your next appointment, please call your pharmacy*   Lab Work: No labs ordered today    Testing/Procedures: No test ordered today    Follow-Up: At [facility], you and your health needs are our priority.  As part of our continuing mission to provide you with exceptional heart care, we have created designated Provider Care Teams.  These Care Teams include your primary Cardiologist (physician) and Advanced Practice Providers (APPs -  Physician Assistants and Nurse Practitioners) who all work together to provide you with the care you need, when you need it.  We recommend signing up for the patient portal called "MyChart".  Sign up information is provided on this After Visit Summary.  MyChart is used to connect with patients for Virtual Visits (Telemedicine).  Patients are able to view lab/test results, encounter notes, upcoming appointments, etc.  Non-urgent messages can be sent to your provider as well.   To learn more about what you can do with MyChart, go to [email or web address].    Your next appointment:   6 month(s)  Provider:   You may see Lorine Bears, MD or one of the following Advanced Practice Providers on your designated Care Team:   Nicolasa Ducking, NP Eula Listen, PA-C Cadence Fransico Michael, PA-C Charlsie Quest, NP Carlos Levering, NP

## 2023-07-02 ENCOUNTER — Inpatient Hospital Stay (HOSPITAL_COMMUNITY)
Admission: EM | Admit: 2023-07-02 | Discharge: 2023-07-11 | DRG: 605 | Disposition: A | Discharge status: Home / Self Care | Payer: Medicare PPO | Attending: General Surgery | Admitting: General Surgery

## 2023-07-02 ENCOUNTER — Emergency Department (HOSPITAL_COMMUNITY): Payer: Medicare PPO

## 2023-07-02 ENCOUNTER — Other Ambulatory Visit: Payer: Self-pay

## 2023-07-02 ENCOUNTER — Encounter (HOSPITAL_COMMUNITY): Payer: Self-pay

## 2023-07-02 DIAGNOSIS — I951 Orthostatic hypotension: Secondary | ICD-10-CM | POA: Diagnosis not present

## 2023-07-02 DIAGNOSIS — D539 Nutritional anemia, unspecified: Secondary | ICD-10-CM | POA: Diagnosis present

## 2023-07-02 DIAGNOSIS — N1831 Chronic kidney disease, stage 3a: Secondary | ICD-10-CM | POA: Diagnosis not present

## 2023-07-02 DIAGNOSIS — Z8711 Personal history of peptic ulcer disease: Secondary | ICD-10-CM

## 2023-07-02 DIAGNOSIS — Z87442 Personal history of urinary calculi: Secondary | ICD-10-CM | POA: Diagnosis not present

## 2023-07-02 DIAGNOSIS — Z79899 Other long term (current) drug therapy: Secondary | ICD-10-CM

## 2023-07-02 DIAGNOSIS — Z9884 Bariatric surgery status: Secondary | ICD-10-CM | POA: Diagnosis not present

## 2023-07-02 DIAGNOSIS — Z7984 Long term (current) use of oral hypoglycemic drugs: Secondary | ICD-10-CM

## 2023-07-02 DIAGNOSIS — Z79891 Long term (current) use of opiate analgesic: Secondary | ICD-10-CM

## 2023-07-02 DIAGNOSIS — D62 Acute posthemorrhagic anemia: Secondary | ICD-10-CM | POA: Diagnosis not present

## 2023-07-02 DIAGNOSIS — G894 Chronic pain syndrome: Secondary | ICD-10-CM | POA: Diagnosis present

## 2023-07-02 DIAGNOSIS — K219 Gastro-esophageal reflux disease without esophagitis: Secondary | ICD-10-CM | POA: Diagnosis present

## 2023-07-02 DIAGNOSIS — R42 Dizziness and giddiness: Secondary | ICD-10-CM | POA: Diagnosis not present

## 2023-07-02 DIAGNOSIS — Y92012 Bathroom of single-family (private) house as the place of occurrence of the external cause: Secondary | ICD-10-CM | POA: Diagnosis not present

## 2023-07-02 DIAGNOSIS — I5032 Chronic diastolic (congestive) heart failure: Secondary | ICD-10-CM | POA: Diagnosis not present

## 2023-07-02 DIAGNOSIS — W1839XA Other fall on same level, initial encounter: Secondary | ICD-10-CM | POA: Diagnosis present

## 2023-07-02 DIAGNOSIS — Z8269 Family history of other diseases of the musculoskeletal system and connective tissue: Secondary | ICD-10-CM

## 2023-07-02 DIAGNOSIS — R55 Syncope and collapse: Secondary | ICD-10-CM | POA: Diagnosis not present

## 2023-07-02 DIAGNOSIS — Y9389 Activity, other specified: Secondary | ICD-10-CM | POA: Diagnosis not present

## 2023-07-02 DIAGNOSIS — I10 Essential (primary) hypertension: Secondary | ICD-10-CM | POA: Diagnosis not present

## 2023-07-02 DIAGNOSIS — S299XXA Unspecified injury of thorax, initial encounter: Secondary | ICD-10-CM | POA: Diagnosis not present

## 2023-07-02 DIAGNOSIS — I959 Hypotension, unspecified: Secondary | ICD-10-CM | POA: Diagnosis not present

## 2023-07-02 DIAGNOSIS — Z981 Arthrodesis status: Secondary | ICD-10-CM

## 2023-07-02 DIAGNOSIS — M545 Low back pain, unspecified: Secondary | ICD-10-CM | POA: Diagnosis not present

## 2023-07-02 DIAGNOSIS — Z8041 Family history of malignant neoplasm of ovary: Secondary | ICD-10-CM

## 2023-07-02 DIAGNOSIS — I130 Hypertensive heart and chronic kidney disease with heart failure and stage 1 through stage 4 chronic kidney disease, or unspecified chronic kidney disease: Secondary | ICD-10-CM | POA: Diagnosis present

## 2023-07-02 DIAGNOSIS — W19XXXA Unspecified fall, initial encounter: Secondary | ICD-10-CM

## 2023-07-02 DIAGNOSIS — M542 Cervicalgia: Secondary | ICD-10-CM | POA: Diagnosis present

## 2023-07-02 DIAGNOSIS — E1122 Type 2 diabetes mellitus with diabetic chronic kidney disease: Secondary | ICD-10-CM | POA: Diagnosis not present

## 2023-07-02 DIAGNOSIS — Z808 Family history of malignant neoplasm of other organs or systems: Secondary | ICD-10-CM

## 2023-07-02 DIAGNOSIS — Z8249 Family history of ischemic heart disease and other diseases of the circulatory system: Secondary | ICD-10-CM

## 2023-07-02 DIAGNOSIS — G319 Degenerative disease of nervous system, unspecified: Secondary | ICD-10-CM | POA: Diagnosis not present

## 2023-07-02 DIAGNOSIS — R109 Unspecified abdominal pain: Secondary | ICD-10-CM | POA: Diagnosis present

## 2023-07-02 DIAGNOSIS — R110 Nausea: Secondary | ICD-10-CM | POA: Diagnosis not present

## 2023-07-02 DIAGNOSIS — Z9049 Acquired absence of other specified parts of digestive tract: Secondary | ICD-10-CM

## 2023-07-02 DIAGNOSIS — S301XXA Contusion of abdominal wall, initial encounter: Secondary | ICD-10-CM | POA: Diagnosis not present

## 2023-07-02 DIAGNOSIS — E785 Hyperlipidemia, unspecified: Secondary | ICD-10-CM | POA: Diagnosis not present

## 2023-07-02 DIAGNOSIS — S2242XA Multiple fractures of ribs, left side, initial encounter for closed fracture: Secondary | ICD-10-CM | POA: Diagnosis not present

## 2023-07-02 DIAGNOSIS — E119 Type 2 diabetes mellitus without complications: Secondary | ICD-10-CM

## 2023-07-02 DIAGNOSIS — M160 Bilateral primary osteoarthritis of hip: Secondary | ICD-10-CM | POA: Diagnosis not present

## 2023-07-02 DIAGNOSIS — I6523 Occlusion and stenosis of bilateral carotid arteries: Secondary | ICD-10-CM | POA: Diagnosis not present

## 2023-07-02 DIAGNOSIS — S3993XA Unspecified injury of pelvis, initial encounter: Secondary | ICD-10-CM | POA: Diagnosis not present

## 2023-07-02 DIAGNOSIS — Z833 Family history of diabetes mellitus: Secondary | ICD-10-CM

## 2023-07-02 DIAGNOSIS — R251 Tremor, unspecified: Secondary | ICD-10-CM | POA: Diagnosis present

## 2023-07-02 DIAGNOSIS — S7001XA Contusion of right hip, initial encounter: Secondary | ICD-10-CM | POA: Diagnosis present

## 2023-07-02 DIAGNOSIS — F32A Depression, unspecified: Secondary | ICD-10-CM | POA: Diagnosis present

## 2023-07-02 DIAGNOSIS — S0990XA Unspecified injury of head, initial encounter: Secondary | ICD-10-CM | POA: Diagnosis not present

## 2023-07-02 DIAGNOSIS — M5416 Radiculopathy, lumbar region: Secondary | ICD-10-CM | POA: Diagnosis present

## 2023-07-02 DIAGNOSIS — Z888 Allergy status to other drugs, medicaments and biological substances status: Secondary | ICD-10-CM

## 2023-07-02 DIAGNOSIS — Z96652 Presence of left artificial knee joint: Secondary | ICD-10-CM | POA: Diagnosis present

## 2023-07-02 DIAGNOSIS — E876 Hypokalemia: Secondary | ICD-10-CM | POA: Diagnosis not present

## 2023-07-02 DIAGNOSIS — Z8262 Family history of osteoporosis: Secondary | ICD-10-CM

## 2023-07-02 DIAGNOSIS — G250 Essential tremor: Secondary | ICD-10-CM | POA: Diagnosis present

## 2023-07-02 DIAGNOSIS — F419 Anxiety disorder, unspecified: Secondary | ICD-10-CM | POA: Diagnosis present

## 2023-07-02 DIAGNOSIS — Z85828 Personal history of other malignant neoplasm of skin: Secondary | ICD-10-CM

## 2023-07-02 DIAGNOSIS — Z801 Family history of malignant neoplasm of trachea, bronchus and lung: Secondary | ICD-10-CM

## 2023-07-02 DIAGNOSIS — Z043 Encounter for examination and observation following other accident: Secondary | ICD-10-CM | POA: Diagnosis not present

## 2023-07-02 DIAGNOSIS — N400 Enlarged prostate without lower urinary tract symptoms: Secondary | ICD-10-CM | POA: Diagnosis present

## 2023-07-02 DIAGNOSIS — G8929 Other chronic pain: Secondary | ICD-10-CM | POA: Diagnosis present

## 2023-07-02 DIAGNOSIS — M51369 Other intervertebral disc degeneration, lumbar region without mention of lumbar back pain or lower extremity pain: Secondary | ICD-10-CM

## 2023-07-02 DIAGNOSIS — S199XXA Unspecified injury of neck, initial encounter: Secondary | ICD-10-CM | POA: Diagnosis not present

## 2023-07-02 DIAGNOSIS — Z8042 Family history of malignant neoplasm of prostate: Secondary | ICD-10-CM

## 2023-07-02 DIAGNOSIS — I700 Atherosclerosis of aorta: Secondary | ICD-10-CM | POA: Diagnosis not present

## 2023-07-02 DIAGNOSIS — Z881 Allergy status to other antibiotic agents status: Secondary | ICD-10-CM | POA: Diagnosis not present

## 2023-07-02 DIAGNOSIS — Z807 Family history of other malignant neoplasms of lymphoid, hematopoietic and related tissues: Secondary | ICD-10-CM

## 2023-07-02 DIAGNOSIS — S3991XA Unspecified injury of abdomen, initial encounter: Secondary | ICD-10-CM | POA: Diagnosis not present

## 2023-07-02 DIAGNOSIS — N179 Acute kidney failure, unspecified: Secondary | ICD-10-CM | POA: Diagnosis present

## 2023-07-02 DIAGNOSIS — N138 Other obstructive and reflux uropathy: Secondary | ICD-10-CM | POA: Diagnosis present

## 2023-07-02 LAB — CBC WITH DIFFERENTIAL/PLATELET
Abs Immature Granulocytes: 0.13 10*3/uL — ABNORMAL HIGH (ref 0.00–0.07)
Basophils Absolute: 0.1 10*3/uL (ref 0.0–0.1)
Basophils Relative: 0 %
Eosinophils Absolute: 0 10*3/uL (ref 0.0–0.5)
Eosinophils Relative: 0 %
HCT: 34 % — ABNORMAL LOW (ref 39.0–52.0)
Hemoglobin: 11.6 g/dL — ABNORMAL LOW (ref 13.0–17.0)
Immature Granulocytes: 1 %
Lymphocytes Relative: 8 %
Lymphs Abs: 1.5 10*3/uL (ref 0.7–4.0)
MCH: 36.4 pg — ABNORMAL HIGH (ref 26.0–34.0)
MCHC: 34.1 g/dL (ref 30.0–36.0)
MCV: 106.6 fL — ABNORMAL HIGH (ref 80.0–100.0)
Monocytes Absolute: 0.8 10*3/uL (ref 0.1–1.0)
Monocytes Relative: 5 %
Neutro Abs: 15.1 10*3/uL — ABNORMAL HIGH (ref 1.7–7.7)
Neutrophils Relative %: 86 %
Platelets: 149 10*3/uL — ABNORMAL LOW (ref 150–400)
RBC: 3.19 MIL/uL — ABNORMAL LOW (ref 4.22–5.81)
RDW: 13.8 % (ref 11.5–15.5)
WBC: 17.6 10*3/uL — ABNORMAL HIGH (ref 4.0–10.5)
nRBC: 0 % (ref 0.0–0.2)

## 2023-07-02 LAB — I-STAT CHEM 8, ED
BUN: 9 mg/dL (ref 8–23)
Calcium, Ion: 1.01 mmol/L — ABNORMAL LOW (ref 1.15–1.40)
Chloride: 100 mmol/L (ref 98–111)
Creatinine, Ser: 1.9 mg/dL — ABNORMAL HIGH (ref 0.61–1.24)
Glucose, Bld: 176 mg/dL — ABNORMAL HIGH (ref 70–99)
HCT: 37 % — ABNORMAL LOW (ref 39.0–52.0)
Hemoglobin: 12.6 g/dL — ABNORMAL LOW (ref 13.0–17.0)
Potassium: 3.5 mmol/L (ref 3.5–5.1)
Sodium: 134 mmol/L — ABNORMAL LOW (ref 135–145)
TCO2: 18 mmol/L — ABNORMAL LOW (ref 22–32)

## 2023-07-02 LAB — COMPREHENSIVE METABOLIC PANEL
ALT: 81 U/L — ABNORMAL HIGH (ref 0–44)
AST: 68 U/L — ABNORMAL HIGH (ref 15–41)
Albumin: 2.9 g/dL — ABNORMAL LOW (ref 3.5–5.0)
Alkaline Phosphatase: 44 U/L (ref 38–126)
Anion gap: 15 (ref 5–15)
BUN: 9 mg/dL (ref 8–23)
CO2: 18 mmol/L — ABNORMAL LOW (ref 22–32)
Calcium: 8.7 mg/dL — ABNORMAL LOW (ref 8.9–10.3)
Chloride: 100 mmol/L (ref 98–111)
Creatinine, Ser: 1.71 mg/dL — ABNORMAL HIGH (ref 0.61–1.24)
GFR, Estimated: 42 mL/min — ABNORMAL LOW (ref >60)
Glucose, Bld: 185 mg/dL — ABNORMAL HIGH (ref 70–99)
Potassium: 3.5 mmol/L (ref 3.5–5.1)
Sodium: 133 mmol/L — ABNORMAL LOW (ref 135–145)
Total Bilirubin: 0.6 mg/dL (ref <1.2)
Total Protein: 5.3 g/dL — ABNORMAL LOW (ref 6.5–8.1)

## 2023-07-02 LAB — URINALYSIS, ROUTINE W REFLEX MICROSCOPIC
Bacteria, UA: NONE SEEN
Bilirubin Urine: NEGATIVE
Glucose, UA: NEGATIVE mg/dL
Ketones, ur: NEGATIVE mg/dL
Leukocytes,Ua: NEGATIVE
Nitrite: NEGATIVE
Protein, ur: NEGATIVE mg/dL
Specific Gravity, Urine: 1.024 (ref 1.005–1.030)
pH: 5 (ref 5.0–8.0)

## 2023-07-02 LAB — I-STAT CG4 LACTIC ACID, ED: Lactic Acid, Venous: 4.9 mmol/L (ref 0.5–1.9)

## 2023-07-02 LAB — PROTIME-INR
INR: 1.1 (ref 0.8–1.2)
Prothrombin Time: 14.3 s (ref 11.4–15.2)

## 2023-07-02 LAB — ETHANOL: Alcohol, Ethyl (B): 75 mg/dL — ABNORMAL HIGH (ref <10)

## 2023-07-02 LAB — CBC
HCT: 37.6 % — ABNORMAL LOW (ref 39.0–52.0)
Hemoglobin: 12.2 g/dL — ABNORMAL LOW (ref 13.0–17.0)
MCH: 35 pg — ABNORMAL HIGH (ref 26.0–34.0)
MCHC: 32.4 g/dL (ref 30.0–36.0)
MCV: 107.7 fL — ABNORMAL HIGH (ref 80.0–100.0)
Platelets: 156 10*3/uL (ref 150–400)
RBC: 3.49 MIL/uL — ABNORMAL LOW (ref 4.22–5.81)
RDW: 13.8 % (ref 11.5–15.5)
WBC: 12.1 10*3/uL — ABNORMAL HIGH (ref 4.0–10.5)
nRBC: 0 % (ref 0.0–0.2)

## 2023-07-02 LAB — SAMPLE TO BLOOD BANK

## 2023-07-02 MED ORDER — SODIUM CHLORIDE 0.9 % IV BOLUS
500.0000 mL | Freq: Once | INTRAVENOUS | Status: AC
  Administered 2023-07-02: 500 mL via INTRAVENOUS

## 2023-07-02 MED ORDER — FENTANYL CITRATE PF 50 MCG/ML IJ SOSY
50.0000 ug | PREFILLED_SYRINGE | Freq: Once | INTRAMUSCULAR | Status: AC
  Administered 2023-07-02: 50 ug via INTRAVENOUS
  Filled 2023-07-02: qty 1

## 2023-07-02 MED ORDER — ACETAMINOPHEN 500 MG PO TABS
1000.0000 mg | ORAL_TABLET | Freq: Four times a day (QID) | ORAL | Status: DC
  Administered 2023-07-02 – 2023-07-04 (×8): 1000 mg via ORAL
  Filled 2023-07-02 (×9): qty 2

## 2023-07-02 MED ORDER — SODIUM CHLORIDE 0.9 % IV BOLUS: 500.0000 mL | Freq: Once | INTRAVENOUS | Status: DC

## 2023-07-02 MED ORDER — METOPROLOL TARTRATE 5 MG/5ML IV SOLN: 5.0000 mg | Freq: Four times a day (QID) | INTRAVENOUS | Status: DC | PRN

## 2023-07-02 MED ORDER — ONDANSETRON HCL 4 MG/2ML IJ SOLN
4.0000 mg | Freq: Four times a day (QID) | INTRAMUSCULAR | Status: DC | PRN
  Administered 2023-07-02 – 2023-07-07 (×3): 4 mg via INTRAVENOUS
  Filled 2023-07-02 (×2): qty 2

## 2023-07-02 MED ORDER — LACTATED RINGERS IV BOLUS: 1000.0000 mL | Freq: Once | INTRAVENOUS | Status: DC

## 2023-07-02 MED ORDER — OXYCODONE HCL 5 MG PO TABS: 2.5000 mg | ORAL_TABLET | ORAL | Status: DC | PRN

## 2023-07-02 MED ORDER — DOCUSATE SODIUM 100 MG PO CAPS
100.0000 mg | ORAL_CAPSULE | Freq: Two times a day (BID) | ORAL | Status: DC
  Administered 2023-07-02 – 2023-07-11 (×13): 100 mg via ORAL
  Filled 2023-07-02 (×17): qty 1

## 2023-07-02 MED ORDER — MORPHINE SULFATE (PF) 2 MG/ML IV SOLN
2.0000 mg | INTRAVENOUS | Status: DC | PRN
  Administered 2023-07-02 – 2023-07-04 (×11): 2 mg via INTRAVENOUS
  Filled 2023-07-02 (×11): qty 1

## 2023-07-02 MED ORDER — MORPHINE SULFATE (PF) 2 MG/ML IV SOLN
2.0000 mg | Freq: Once | INTRAVENOUS | Status: AC
  Administered 2023-07-02: 2 mg via INTRAVENOUS
  Filled 2023-07-02: qty 1

## 2023-07-02 MED ORDER — OXYCODONE HCL 5 MG PO TABS
5.0000 mg | ORAL_TABLET | Freq: Once | ORAL | Status: AC
  Administered 2023-07-02: 5 mg via ORAL
  Filled 2023-07-02: qty 1

## 2023-07-02 MED ORDER — POLYETHYLENE GLYCOL 3350 17 G PO PACK
17.0000 g | PACK | Freq: Every day | ORAL | Status: DC | PRN
  Filled 2023-07-02: qty 1

## 2023-07-02 MED ORDER — IOHEXOL 350 MG/ML SOLN
60.0000 mL | Freq: Once | INTRAVENOUS | Status: AC | PRN
  Administered 2023-07-02: 60 mL via INTRAVENOUS

## 2023-07-02 MED ORDER — LIDOCAINE 5 % EX PTCH
1.0000 | MEDICATED_PATCH | CUTANEOUS | Status: DC
  Administered 2023-07-02 – 2023-07-04 (×3): 1 via TRANSDERMAL
  Filled 2023-07-02 (×2): qty 1

## 2023-07-02 MED ORDER — OXYCODONE HCL 5 MG PO TABS
5.0000 mg | ORAL_TABLET | ORAL | Status: DC | PRN
Start: 2023-07-02 — End: 2023-07-04
  Administered 2023-07-02: 5 mg via ORAL
  Administered 2023-07-02 – 2023-07-04 (×9): 10 mg via ORAL
  Filled 2023-07-02 (×10): qty 2

## 2023-07-02 MED ORDER — HYDRALAZINE HCL 20 MG/ML IJ SOLN: 10.0000 mg | INTRAMUSCULAR | Status: DC | PRN

## 2023-07-02 MED ORDER — METHOCARBAMOL 500 MG PO TABS
1000.0000 mg | ORAL_TABLET | Freq: Three times a day (TID) | ORAL | Status: DC
  Administered 2023-07-02 – 2023-07-04 (×7): 1000 mg via ORAL
  Filled 2023-07-02 (×7): qty 2

## 2023-07-02 NOTE — ED Notes (Signed)
Abdominal binder applied at this time.

## 2023-07-02 NOTE — H&P (Addendum)
Reason for Consult/Chief Complaint: flank hematoma Consultant: [name], MD  Justin Patel is an 72 y.o. male.   HPI: [name] s/p fall from standing after voiding. Was standing to urinate, then turned to walk out of the bathroom and then fell, hitting his right back "either on the vanity or on the side of the tub". Denies any prodromal symptoms prior to the fall. Denies hitting his head.   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  removal scheduled to clear margins.   CHF (congestive heart failure) (HCC)    no longer after weight loss   Chronic kidney disease    cysts   Degenerative disc disease    Depression    Diabetes mellitus    no longer diabetic-or on meds   DJD (degenerative joint disease)    Dysplastic nevus 02/04/2016   R upper back paraspinal - severe   Family history of adverse reaction to anesthesia    sons wake up combative   Foot fracture, left 01/2022   Gastric ulcer    GERD (gastroesophageal reflux disease)    H/O hiatal hernia    Headache(784.0)    sinus   Heart murmur    History of fusion of thoracic spine 12/28/2021   History of gastric bypass    Hx of congestive heart failure    Hyperlipidemia    Hypertension    Iron deficiency anemia 02/28/2015   Lumbar scoliosis    Opiate abuse, continuous (HCC) 06/20/2015   S/P laparoscopic cholecystectomy 02/18/2020   Sacral fracture, closed (HCC)    Seizures (HCC)    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, after GI bleed   Sleep apnea    hx not now since wt loss   Stones in the urinary tract    Syncope and collapse     Past Surgical History:  Procedure Laterality Date   ANTERIOR CERVICAL DECOMP/DISCECTOMY FUSION  01/26/2012   Procedure: ANTERIOR CERVICAL DECOMPRESSION/DISCECTOMY FUSION 3 LEVELS;  Surgeon: [name], MD;  Location: [facility];   Service: Neurosurgery;  Laterality: N/A;  Cervical three-four Cervical four-five Cervical five-six Cervical six-seven , Anterior cervical decompression/diskectomy, fusion, plate   ANTERIOR LAT LUMBAR FUSION Right 04/25/2018   Procedure: Right Lumbar two-three Lumbar three-four Lumbar four-five anterior  lateral interbody fusion  with posterior percutaneous pedicle screws;  Surgeon: [name], MD;  Location: [facility];  Service: Neurosurgery;  Laterality: Right;   APPENDECTOMY     CARDIAC CATHETERIZATION     [facility], normal   CARDIAC CATHETERIZATION     [place]   CHOLECYSTECTOMY     COLONOSCOPY WITH PROPOFOL N/A 12/27/2017   Procedure: COLONOSCOPY WITH PROPOFOL;  Surgeon: Toledo, [name], MD;  Location: [facility];  Service: Gastroenterology;  Laterality: N/A;   ESOPHAGOGASTRODUODENOSCOPY (EGD) WITH PROPOFOL N/A 12/27/2017   Procedure: ESOPHAGOGASTRODUODENOSCOPY (EGD) WITH PROPOFOL;  Surgeon: Toledo, [name], MD;  Location: [facility];  Service: Gastroenterology;  Laterality: N/A;   ESOPHAGOGASTRODUODENOSCOPY (EGD) WITH PROPOFOL N/A 06/19/2021   Procedure: ESOPHAGOGASTRODUODENOSCOPY (EGD) WITH PROPOFOL;  Surgeon: [name], MD;  Location: [facility];  Service: Gastroenterology;  Laterality: N/A;   ESOPHAGOGASTRODUODENOSCOPY (EGD) WITH PROPOFOL N/A 09/25/2021   Procedure: ESOPHAGOGASTRODUODENOSCOPY (EGD) WITH PROPOFOL;  Surgeon: [name], MD;  Location: [facility];  Service: Endoscopy;  Laterality:  N/A;   GASTRIC BYPASS  08/09/2008   [employer] [facility]   HARDWARE REMOVAL N/A 03/08/2022   Procedure: Removal of bilateral Thoracic ten pedicle screws;  Surgeon: [name], MD;  Location: [facility] OR;  Service: Neurosurgery;  Laterality: N/A;   HERNIA REPAIR  08/09/1998   hiatal   JOINT REPLACEMENT     KNEE ARTHROSCOPY     bilateral, left x 2   LAMINECTOMY WITH POSTERIOR LATERAL ARTHRODESIS LEVEL 4 N/A 01/14/2020   Procedure: Decompressive Laminectomy  Lumbar One with pedicle screw fixation from Thoracic Ten to Lumbar Two;  Surgeon: [name], MD;  Location: [facility] OR;  Service: Neurosurgery;  Laterality: N/A;  Decompressive Laminectomy Lumbar One with pedicle screw fixation from Thoracic Ten to Lumbar Two   LUMBAR PERCUTANEOUS PEDICLE SCREW 3 LEVEL N/A 04/25/2018   Procedure: LUMBAR PERCUTANEOUS PEDICLE SCREW 3 LEVEL;  Surgeon: [name], MD;  Location: [facility] OR;  Service: Neurosurgery;  Laterality: N/A;   NASAL SINUS SURGERY     x5   OSTEOTOMY  08/10/1999   left   ROUX-EN-Y GASTRIC BYPASS     THORACIC LAMINECTOMY FOR EPIDURAL ABSCESS N/A 03/28/2022   Procedure: THORACIC WOUND WASHOUT;  Surgeon: [name], MD;  Location: [facility] OR;  Service: Neurosurgery;  Laterality: N/A;   TONSILLECTOMY     TOTAL KNEE ARTHROPLASTY Left 05/09/2014   Dr. [name]   UVULOPALATOPLASTY  08/09/2009    Family History  Problem Relation Age of Onset   Dementia Mother 74   Osteoporosis Mother    Heart disease Father 102   Diabetes Father    Lymphoma Sister        lymphoma, stage 4   Ovarian cancer Sister 44       Ovarian   Lupus Sister    Prostate cancer Maternal Uncle    Skin cancer Maternal Uncle    Tongue cancer Maternal Uncle        uncle died of heart attack   Alcoholism Maternal Uncle    Lung cancer Paternal Aunt        pat aunts x 4 died of lung cancer   Lung cancer Paternal Aunt    Lung cancer Paternal Aunt    Lung cancer Paternal Aunt    Mesothelioma Cousin        paternal cousin    Social History:  reports that he has never smoked. He has never used smokeless tobacco. He reports current alcohol use of about 21.0 standard drinks of alcohol per week. He reports that he does not use drugs.  Allergies:  Allergies  Allergen Reactions   Altace [Ramipril] Anaphylaxis   Ace Inhibitors Hives   Levaquin [Levofloxacin In D5w] Hives   Lyrica [Pregabalin] Other (See Comments)    Edema   Metformin Diarrhea    High doses cause diarrhea.     Trulicity [Dulaglutide] Nausea And Vomiting    Medications: I have reviewed the patient's current medications.  Results for orders placed or performed during the hospital encounter of 07/02/23 (from the past 48 hour(s))  Comprehensive metabolic panel     Status: Abnormal   Collection Time: 07/02/23  2:19 AM  Result Value Ref Range   Sodium 133 (L) 135 - 145 mmol/L   Potassium 3.5 3.5 - 5.1 mmol/L   Chloride 100 98 - 111 mmol/L   CO2 18 (L) 22 - 32 mmol/L   Glucose, Bld 185 (H) 70 - 99 mg/dL    Comment: Glucose reference range applies only to samples taken after  fasting for at least 8 hours.   BUN 9 8 - 23 mg/dL   Creatinine, Ser 0.98 (H) 0.61 - 1.24 mg/dL   Calcium 8.7 (L) 8.9 - 10.3 mg/dL   Total Protein 5.3 (L) 6.5 - 8.1 g/dL   Albumin 2.9 (L) 3.5 - 5.0 g/dL   AST 68 (H) 15 - 41 U/L   ALT 81 (H) 0 - 44 U/L   Alkaline Phosphatase 44 38 - 126 U/L   Total Bilirubin 0.6 <1.2 mg/dL   GFR, Estimated 42 (L) >60 mL/min    Comment: (NOTE) Calculated using the CKD-EPI Creatinine Equation (2021)    Anion gap 15 5 - 15    Comment: Performed at [facility], [address] [place], [place] [address]  CBC     Status: Abnormal   Collection Time: 07/02/23  2:19 AM  Result Value Ref Range   WBC 12.1 (H) 4.0 - 10.5 K/uL   RBC 3.49 (L) 4.22 - 5.81 MIL/uL   Hemoglobin 12.2 (L) 13.0 - 17.0 g/dL   HCT 78.2 (L) 95.6 - 21.3 %   MCV 107.7 (H) 80.0 - 100.0 fL   MCH 35.0 (H) 26.0 - 34.0 pg   MCHC 32.4 30.0 - 36.0 g/dL   RDW 08.6 57.8 - 46.9 %   Platelets 156 150 - 400 K/uL   nRBC 0.0 0.0 - 0.2 %    Comment: Performed at [facility], [address], [place], [place] [address]  Ethanol     Status: Abnormal   Collection Time: 07/02/23  2:19 AM  Result Value Ref Range   Alcohol, Ethyl (B) 75 (H) <10 mg/dL    Comment: (NOTE) Lowest detectable limit for serum alcohol is 10 mg/dL.  For medical purposes only. Performed at [facility], [address] [place],  [place] [address]   Protime-INR     Status: None   Collection Time: 07/02/23  2:19 AM  Result Value Ref Range   Prothrombin Time 14.3 11.4 - 15.2 seconds   INR 1.1 0.8 - 1.2    Comment: (NOTE) INR goal varies based on device and disease states. Performed at [facility], [address] [place], [place] [address]   Sample to Blood Bank     Status: None   Collection Time: 07/02/23  2:30 AM  Result Value Ref Range   Blood Bank Specimen SAMPLE AVAILABLE FOR TESTING    Sample Expiration      07/05/2023,2359 Performed at [facility], [address], [place], [place] [address]   I-Stat Chem 8, ED     Status: Abnormal   Collection Time: 07/02/23  2:32 AM  Result Value Ref Range   Sodium 134 (L) 135 - 145 mmol/L   Potassium 3.5 3.5 - 5.1 mmol/L   Chloride 100 98 - 111 mmol/L   BUN 9 8 - 23 mg/dL   Creatinine, Ser 6.64 (H) 0.61 - 1.24 mg/dL   Glucose, Bld 403 (H) 70 - 99 mg/dL    Comment: Glucose reference range applies only to samples taken after fasting for at least 8 hours.   Calcium, Ion 1.01 (L) 1.15 - 1.40 mmol/L   TCO2 18 (L) 22 - 32 mmol/L   Hemoglobin 12.6 (L) 13.0 - 17.0 g/dL   HCT 47.4 (L) 25.9 - 56.3 %  I-Stat Lactic Acid, ED     Status: Abnormal   Collection Time: 07/02/23  2:33 AM  Result Value Ref Range  Lactic Acid, Venous 4.9 (HH) 0.5 - 1.9 mmol/L   Comment NOTIFIED PHYSICIAN   CBC with Differential     Status: Abnormal   Collection Time: 07/02/23  4:07 AM  Result Value Ref Range   WBC 17.6 (H) 4.0 - 10.5 K/uL   RBC 3.19 (L) 4.22 - 5.81 MIL/uL   Hemoglobin 11.6 (L) 13.0 - 17.0 g/dL   HCT 16.1 (L) 09.6 - 04.5 %   MCV 106.6 (H) 80.0 - 100.0 fL   MCH 36.4 (H) 26.0 - 34.0 pg   MCHC 34.1 30.0 - 36.0 g/dL   RDW 40.9 81.1 - 91.4 %   Platelets 149 (L) 150 - 400 K/uL   nRBC 0.0 0.0 - 0.2 %   Neutrophils Relative % 86 %   Neutro Abs 15.1 (H) 1.7 - 7.7 K/uL   Lymphocytes Relative 8 %   Lymphs Abs 1.5 0.7 - 4.0 K/uL   Monocytes Relative 5 %   Monocytes  Absolute 0.8 0.1 - 1.0 K/uL   Eosinophils Relative 0 %   Eosinophils Absolute 0.0 0.0 - 0.5 K/uL   Basophils Relative 0 %   Basophils Absolute 0.1 0.0 - 0.1 K/uL   Immature Granulocytes 1 %   Abs Immature Granulocytes 0.13 (H) 0.00 - 0.07 K/uL    Comment: Performed at [facility], [address], [place], [place] [address]   *Note: Due to a large number of results and/or encounters for the requested time period, some results have not been displayed. A complete set of results can be found in Results Review.    CT CHEST ABDOMEN PELVIS W CONTRAST  Result Date: 07/02/2023 CLINICAL DATA:  Polytrauma, blunt. Fall. Large hematoma to right flank/lower back area. EXAM: CT CHEST, ABDOMEN, AND PELVIS WITH CONTRAST TECHNIQUE: Multidetector CT imaging of the chest, abdomen and pelvis was performed following the standard protocol during bolus administration of intravenous contrast. RADIATION DOSE REDUCTION: This exam was performed according to the departmental dose-optimization program which includes automated exposure control, adjustment of the mA and/or kV according to patient size and/or use of iterative reconstruction technique. CONTRAST:  60mL OMNIPAQUE IOHEXOL 350 MG/ML SOLN COMPARISON:  11/22/2022. FINDINGS: CT CHEST FINDINGS Cardiovascular: The heart is normal in size and there is no pericardial effusion. Scattered coronary artery calcifications are noted. There is atherosclerotic calcification of the aorta without evidence of aneurysm. The pulmonary trunk is normal in caliber. Mediastinum/Nodes: Calcified lymph nodes are present in the mediastinum and left hilum. No axillary lymphadenopathy. The thyroid gland, trachea, and esophagus are within normal limits. There is a small hiatal hernia. Lungs/Pleura: Mild apical pleural scarring is present bilaterally. Atelectasis is noted bilaterally. No effusion or pneumothorax. Scattered calcified granuloma are present bilaterally. Musculoskeletal: Cervical  spinal fusion hardware is noted. Degenerative changes are present in the thoracic spine. Spinal fusion hardware is noted in the lower thoracic spine. CT ABDOMEN PELVIS FINDINGS Hepatobiliary: No focal liver abnormality is seen. Fatty infiltration of the liver is noted. No gallstones, gallbladder wall thickening, or biliary dilatation. Pancreas: Unremarkable. No pancreatic ductal dilatation or surrounding inflammatory changes. Spleen: Normal in size without focal abnormality. Adrenals/Urinary Tract: The adrenal glands are within normal limits. The kidneys enhance symmetrically. There are bilateral renal cysts. No renal calculus or hydronephrosis is seen. The bladder is unremarkable. Stomach/Bowel: There is a small hiatal hernia. Gastric bypass surgical changes are present. Appendix is not seen. No evidence of bowel wall thickening, distention, or inflammatory changes. No free air or pneumatosis. Scattered diverticula are present  along the colon without evidence of diverticulitis. Vascular/Lymphatic: Aortic atherosclerosis. No enlarged abdominal or pelvic lymph nodes. Reproductive: Prostate is unremarkable. Other: No abdominopelvic ascites. Subcutaneous fat stranding is noted over the right flank. There is a hematoma in the subcutaneous tissues of the right flank measuring 10.5 x 12.5 x 3.1 cm. Contrast extravasation is present within the hematoma suggesting active hemorrhage. Musculoskeletal: A stable compression deformity is noted in the superior endplate at L1. Spinal fusion hardware is noted from T11 to L5. No acute fracture is seen. IMPRESSION: 1. Large hematoma in the subcutaneous tissues of the right flank measuring 12.5 x 10.5 x 3.1 cm. Contrast extravasation is present within the hematoma suggesting active hemorrhage. 2. No evidence of acute fracture or solid organ injury. 3. Small hiatal hernia. 4. Hepatic steatosis. 5. Diverticulosis without diverticulitis. 6. Coronary artery calcification. 7. Aortic  atherosclerosis. Electronically Signed   By: Thornell Sartorius M.D.   On: 07/02/2023 03:26   CT HEAD WO CONTRAST  Result Date: 07/02/2023 CLINICAL DATA:  Felt dizzy after couple of glasses of wine. Fell backwards. Unsure if loss of consciousness. Large hematoma to right flank and lower back area. Initially hypotensive. EXAM: CT HEAD WITHOUT CONTRAST CT CERVICAL SPINE WITHOUT CONTRAST TECHNIQUE: Multidetector CT imaging of the head and cervical spine was performed following the standard protocol without intravenous contrast. Multiplanar CT image reconstructions of the cervical spine were also generated. RADIATION DOSE REDUCTION: This exam was performed according to the departmental dose-optimization program which includes automated exposure control, adjustment of the mA and/or kV according to patient size and/or use of iterative reconstruction technique. COMPARISON:  MRI cervical spine 07/26/2019 and CT head 12/17/2021 FINDINGS: CT HEAD FINDINGS Brain: No intracranial hemorrhage, mass effect, or evidence of acute infarct. No hydrocephalus. No extra-axial fluid collection. Generalized cerebral atrophy and chronic small vessel ischemic disease. Vascular: No hyperdense vessel. Intracranial arterial calcification. Skull: No fracture or focal lesion. Contusion to the posterior midline scalp. Sinuses/Orbits: Bilateral mastoid effusions. Postoperative change about the paranasal sinuses. Chronic left maxillary and sphenoid sinusitis. Other: None. CT CERVICAL SPINE FINDINGS Alignment: No evidence of traumatic malalignment. Chronic anterolisthesis of C7. Skull base and vertebrae: No acute fracture. No primary bone lesion or focal pathologic process. Soft tissues and spinal canal: No prevertebral fluid or swelling. No visible canal hematoma. Disc levels: ACDF C3-C7. Multilevel facet arthropathy. No severe spinal canal narrowing. Upper chest: No acute abnormality. Other: Carotid calcification. IMPRESSION: 1. No acute  intracranial abnormality. 2. No acute fracture in the cervical spine. Electronically Signed   By: Minerva Fester M.D.   On: 07/02/2023 03:18   CT CERVICAL SPINE WO CONTRAST  Result Date: 07/02/2023 CLINICAL DATA:  Felt dizzy after couple of glasses of wine. Fell backwards. Unsure if loss of consciousness. Large hematoma to right flank and lower back area. Initially hypotensive. EXAM: CT HEAD WITHOUT CONTRAST CT CERVICAL SPINE WITHOUT CONTRAST TECHNIQUE: Multidetector CT imaging of the head and cervical spine was performed following the standard protocol without intravenous contrast. Multiplanar CT image reconstructions of the cervical spine were also generated. RADIATION DOSE REDUCTION: This exam was performed according to the departmental dose-optimization program which includes automated exposure control, adjustment of the mA and/or kV according to patient size and/or use of iterative reconstruction technique. COMPARISON:  MRI cervical spine 07/26/2019 and CT head 12/17/2021 FINDINGS: CT HEAD FINDINGS Brain: No intracranial hemorrhage, mass effect, or evidence of acute infarct. No hydrocephalus. No extra-axial fluid collection. Generalized cerebral atrophy and chronic small vessel ischemic disease. Vascular: No  hyperdense vessel. Intracranial arterial calcification. Skull: No fracture or focal lesion. Contusion to the posterior midline scalp. Sinuses/Orbits: Bilateral mastoid effusions. Postoperative change about the paranasal sinuses. Chronic left maxillary and sphenoid sinusitis. Other: None. CT CERVICAL SPINE FINDINGS Alignment: No evidence of traumatic malalignment. Chronic anterolisthesis of C7. Skull base and vertebrae: No acute fracture. No primary bone lesion or focal pathologic process. Soft tissues and spinal canal: No prevertebral fluid or swelling. No visible canal hematoma. Disc levels: ACDF C3-C7. Multilevel facet arthropathy. No severe spinal canal narrowing. Upper chest: No acute abnormality.  Other: Carotid calcification. IMPRESSION: 1. No acute intracranial abnormality. 2. No acute fracture in the cervical spine. Electronically Signed   By: Minerva Fester M.D.   On: 07/02/2023 03:18   DG Hip Unilat W or Wo Pelvis 2-3 Views Right  Result Date: 07/02/2023 CLINICAL DATA:  Fall. EXAM: DG HIP (WITH OR WITHOUT PELVIS) 2-3V RIGHT COMPARISON:  None Available. FINDINGS: There is no evidence of hip fracture or dislocation. Mild degenerative changes are present in the hips bilaterally and sacroiliac joints. Lumbar spinal fusion hardware is noted. IMPRESSION: No acute fracture or dislocation. Electronically Signed   By: Thornell Sartorius M.D.   On: 07/02/2023 02:48   DG Chest Port 1 View  Result Date: 07/02/2023 CLINICAL DATA:  Trauma from fall EXAM: PORTABLE CHEST 1 VIEW COMPARISON:  02/01/2023 FINDINGS: Stable cardiomediastinal silhouette. Aortic atherosclerotic calcification. No focal consolidation, pleural effusion, or pneumothorax. No displaced rib fractures. Chronic left rib fractures. No displaced acute rib fracture. Cervical thoracolumbar spinal fusion hardware. IMPRESSION: No active disease. Electronically Signed   By: Minerva Fester M.D.   On: 07/02/2023 02:47    ROS 10 point review of systems is negative except as listed above in HPI.   Physical Exam Blood pressure 103/69, pulse 76, temperature (!) 97.5 F (36.4 C), temperature source Oral, resp. rate 11, height 5\' 5"  (1.651 m), weight 77.1 kg, SpO2 99%. Constitutional: well-developed, well-nourished HEENT: pupils equal, round, reactive to light, 2mm b/l, moist conjunctiva, external inspection of ears and nose normal, hearing intact Oropharynx: normal oropharyngeal mucosa, poor dentition Neck: no thyromegaly, trachea midline, no midline cervical tenderness to palpation, c-collar removed Chest: breath sounds equal bilaterally, normal respiratory effort, no midline or lateral chest wall tenderness to palpation/deformity Abdomen: soft,  NT, no bruising, no hepatosplenomegaly GU: no blood at urethral meatus of penis, no scrotal masses or abnormality  Back: no wounds, R flank hematoma and bruising Skin: warm, dry, no rashes Psych: normal memory, normal mood/affect     Assessment/Plan: FFS  R flank hematoma - hgb 11.6 from 12.2. SBPs 100s. H/o CHF so will give another slow 500cc bolus. Binder to aid in compression/hemostasis. Recheck CBC after therapy sessions.  FEN - regular diet DVT - SCDs, hold chemical ppx due to bleeding concerns Dispo - PT/OT and decision about discharge pending therapy recs    Diamantina Monks, MD General and Trauma Surgery [facility]

## 2023-07-02 NOTE — ED Notes (Signed)
ED TO INPATIENT HANDOFF REPORT  ED Nurse Name and Phone #: Forde Dandy [phone]  S Name/Age/Gender Justin Patel 72 y.o. male Room/Bed: OTFC/OTF  Code Status   Code Status: Full Code  Home/SNF/Other Home Patient oriented to: self, place, time, and situation Is this baseline? Yes   Triage Complete: Triage complete  Chief Complaint Right flank hematoma [S30.1XXA]  Triage Note Pt BIB [facility] from home c/o having a couple glasses of wine getting up and feeling dizzy and falling backwards. Pt was unsure if he actually lost consciousness. Pt has a large hematoma to the right flank/lower back area. Pt has a 20g in the left wrist. Pt initially was hypotensive with EMS with the lowest being 84/50. Initially not leveled but per Dr. [name] upgrading to a level 2.    Allergies Allergies  Allergen Reactions   Altace [Ramipril] Anaphylaxis   Ace Inhibitors Hives   Levaquin [Levofloxacin In D5w] Hives   Lyrica [Pregabalin] Other (See Comments)    Edema   Metformin Diarrhea    High doses cause diarrhea.    Trulicity [Dulaglutide] Nausea And Vomiting    Level of Care/Admitting Diagnosis ED Disposition     ED Disposition  Admit   Condition  --   Comment  Hospital Area: [facility] [[id number]]  Level of Care: Med-Surg [16]  May place patient in observation at [facility] or [facility] if equivalent level of care is available:: No  Covid Evaluation: Asymptomatic - no recent exposure (last 10 days) testing not required  Diagnosis: Right flank hematoma [440102]  Admitting Physician: TRAUMA MD [[id number]]  Attending Physician: TRAUMA MD [[id number]]  For patients discharging to extended facilities (i.e. 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  removal scheduled to clear margins.   CHF (congestive heart failure) (HCC)    no longer after weight loss   Chronic kidney disease    cysts   Degenerative disc disease    Depression    Diabetes mellitus    no longer diabetic-or on meds   DJD (degenerative joint disease)    Dysplastic nevus 02/04/2016   R upper back paraspinal - severe   Family history of adverse reaction to anesthesia    sons wake up combative   Foot fracture, left 01/2022   Gastric ulcer    GERD (gastroesophageal reflux disease)    H/O hiatal hernia    Headache(784.0)    sinus   Heart murmur    History of fusion of thoracic spine 12/28/2021   History of gastric bypass    Hx of congestive heart failure    Hyperlipidemia    Hypertension    Iron deficiency anemia 02/28/2015   Lumbar scoliosis    Opiate abuse, continuous (HCC) 06/20/2015   S/P laparoscopic cholecystectomy 02/18/2020   Sacral fracture, closed (HCC)    Seizures (HCC)    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, after GI bleed   Sleep apnea    hx not now since wt loss   Stones in the urinary tract    Syncope and collapse    Past Surgical History:  Procedure Laterality Date   ANTERIOR CERVICAL  DECOMP/DISCECTOMY FUSION  01/26/2012   Procedure: ANTERIOR CERVICAL DECOMPRESSION/DISCECTOMY FUSION 3 LEVELS;  Surgeon: [name], MD;  Location: [facility];  Service: Neurosurgery;  Laterality: N/A;  Cervical three-four Cervical four-five Cervical five-six Cervical six-seven , Anterior cervical decompression/diskectomy, fusion, plate   ANTERIOR LAT LUMBAR FUSION Right 04/25/2018   Procedure: Right Lumbar two-three Lumbar three-four Lumbar four-five anterior  lateral interbody fusion  with posterior percutaneous pedicle screws;  Surgeon: [name], MD;  Location: [facility] OR;  Service: Neurosurgery;  Laterality: Right;   APPENDECTOMY     CARDIAC CATHETERIZATION     [facility],  normal   CARDIAC CATHETERIZATION     [place]   CHOLECYSTECTOMY     COLONOSCOPY WITH PROPOFOL N/A 12/27/2017   Procedure: COLONOSCOPY WITH PROPOFOL;  Surgeon: Toledo, [name], MD;  Location: [facility];  Service: Gastroenterology;  Laterality: N/A;   ESOPHAGOGASTRODUODENOSCOPY (EGD) WITH PROPOFOL N/A 12/27/2017   Procedure: ESOPHAGOGASTRODUODENOSCOPY (EGD) WITH PROPOFOL;  Surgeon: Toledo, [name], MD;  Location: [facility]COPY;  Service: Gastroenterology;  Laterality: N/A;   ESOPHAGOGASTRODUODENOSCOPY (EGD) WITH PROPOFOL N/A 06/19/2021   Procedure: ESOPHAGOGASTRODUODENOSCOPY (EGD) WITH PROPOFOL;  Surgeon: [name], MD;  Location: [facility]COPY;  Service: Gastroenterology;  Laterality: N/A;   ESOPHAGOGASTRODUODENOSCOPY (EGD) WITH PROPOFOL N/A 09/25/2021   Procedure: ESOPHAGOGASTRODUODENOSCOPY (EGD) WITH PROPOFOL;  Surgeon: [name], MD;  Location: [facility] ENDOSCOPY;  Service: Endoscopy;  Laterality: N/A;   GASTRIC BYPASS  08/09/2008   [employer]   HARDWARE REMOVAL N/A 03/08/2022   Procedure: Removal of bilateral Thoracic ten pedicle screws;  Surgeon: [name], MD;  Location: [facility] OR;  Service: Neurosurgery;  Laterality: N/A;   HERNIA REPAIR  08/09/1998   hiatal   JOINT REPLACEMENT     KNEE ARTHROSCOPY     bilateral, left x 2   LAMINECTOMY WITH POSTERIOR LATERAL ARTHRODESIS LEVEL 4 N/A 01/14/2020   Procedure: Decompressive Laminectomy Lumbar One with pedicle screw fixation from Thoracic Ten to Lumbar Two;  Surgeon: [name], MD;  Location: [facility] OR;  Service: Neurosurgery;  Laterality: N/A;  Decompressive Laminectomy Lumbar One with pedicle screw fixation from Thoracic Ten to Lumbar Two   LUMBAR PERCUTANEOUS PEDICLE SCREW 3 LEVEL N/A 04/25/2018   Procedure: LUMBAR PERCUTANEOUS PEDICLE SCREW 3 LEVEL;  Surgeon: [name], MD;  Location: [facility] OR;  Service: Neurosurgery;  Laterality: N/A;   NASAL SINUS SURGERY     x5   OSTEOTOMY  08/10/1999   left    ROUX-EN-Y GASTRIC BYPASS     THORACIC LAMINECTOMY FOR EPIDURAL ABSCESS N/A 03/28/2022   Procedure: THORACIC WOUND WASHOUT;  Surgeon: [name], MD;  Location: [facility] OR;  Service: Neurosurgery;  Laterality: N/A;   TONSILLECTOMY     TOTAL KNEE ARTHROPLASTY Left 05/09/2014   Dr. [name]   UVULOPALATOPLASTY  08/09/2009     A IV Location/Drains/Wounds Patient Lines/Drains/Airways Status     Active Line/Drains/Airways     Name Placement date Placement time Site Days   Peripheral IV 07/02/23 20 G 1" Right Antecubital 07/02/23  0231  Antecubital  less than 1   Peripheral IV 07/02/23 20 G Left Wrist 07/02/23  --  Wrist  less than 1            Intake/Output Last 24 hours  Intake/Output Summary (Last 24 hours) at 07/02/2023 1043 Last data filed at 07/02/2023 0524 Gross per 24 hour  Intake 500.99 ml  Output --  Net 500.99 ml    Labs/Imaging Results for orders placed or performed  during the hospital encounter of 07/02/23 (from the past 48 hour(s))  Comprehensive metabolic panel     Status: Abnormal   Collection Time: 07/02/23  2:19 AM  Result Value Ref Range   Sodium 133 (L) 135 - 145 mmol/L   Potassium 3.5 3.5 - 5.1 mmol/L   Chloride 100 98 - 111 mmol/L   CO2 18 (L) 22 - 32 mmol/L   Glucose, Bld 185 (H) 70 - 99 mg/dL    Comment: Glucose reference range applies only to samples taken after fasting for at least 8 hours.   BUN 9 8 - 23 mg/dL   Creatinine, Ser 7.84 (H) 0.61 - 1.24 mg/dL   Calcium 8.7 (L) 8.9 - 10.3 mg/dL   Total Protein 5.3 (L) 6.5 - 8.1 g/dL   Albumin 2.9 (L) 3.5 - 5.0 g/dL   AST 68 (H) 15 - 41 U/L   ALT 81 (H) 0 - 44 U/L   Alkaline Phosphatase 44 38 - 126 U/L   Total Bilirubin 0.6 <1.2 mg/dL   GFR, Estimated 42 (L) >60 mL/min    Comment: (NOTE) Calculated using the CKD-EPI Creatinine Equation (2021)    Anion gap 15 5 - 15    Comment: Performed at [facility], [address] [place], [place] [address]  CBC     Status: Abnormal   Collection Time:  07/02/23  2:19 AM  Result Value Ref Range   WBC 12.1 (H) 4.0 - 10.5 K/uL   RBC 3.49 (L) 4.22 - 5.81 MIL/uL   Hemoglobin 12.2 (L) 13.0 - 17.0 g/dL   HCT 52.8 (L) 41.3 - 24.4 %   MCV 107.7 (H) 80.0 - 100.0 fL   MCH 35.0 (H) 26.0 - 34.0 pg   MCHC 32.4 30.0 - 36.0 g/dL   RDW 01.0 27.2 - 53.6 %   Platelets 156 150 - 400 K/uL   nRBC 0.0 0.0 - 0.2 %    Comment: Performed at [facility], [address], [place], [place] [address]  Ethanol     Status: Abnormal   Collection Time: 07/02/23  2:19 AM  Result Value Ref Range   Alcohol, Ethyl (B) 75 (H) <10 mg/dL    Comment: (NOTE) Lowest detectable limit for serum alcohol is 10 mg/dL.  For medical purposes only. Performed at [facility], [address], [place], [place] [address]   Protime-INR     Status: None   Collection Time: 07/02/23  2:19 AM  Result Value Ref Range   Prothrombin Time 14.3 11.4 - 15.2 seconds   INR 1.1 0.8 - 1.2    Comment: (NOTE) INR goal varies based on device and disease states. Performed at [facility], [address], [place], [place] [address]   Sample to Blood Bank     Status: None   Collection Time: 07/02/23  2:30 AM  Result Value Ref Range   Blood Bank Specimen SAMPLE AVAILABLE FOR TESTING    Sample Expiration      07/05/2023,2359 Performed at [facility], [address], [place], [place] [address]   I-Stat Chem 8, ED     Status: Abnormal   Collection Time: 07/02/23  2:32 AM  Result Value Ref Range   Sodium 134 (L) 135 - 145 mmol/L   Potassium 3.5 3.5 - 5.1 mmol/L   Chloride 100 98 - 111 mmol/L   BUN 9 8 - 23 mg/dL   Creatinine, Ser 3.29 (H) 0.61 - 1.24  mg/dL   Glucose, Bld 657 (H) 70 - 99 mg/dL    Comment: Glucose reference range applies only to samples taken after fasting for at least 8 hours.   Calcium, Ion 1.01 (L) 1.15 - 1.40 mmol/L   TCO2 18 (L) 22 - 32 mmol/L   Hemoglobin 12.6 (L) 13.0 - 17.0 g/dL   HCT 84.6 (L) 96.2 - 95.2 %  I-Stat Lactic Acid, ED     Status:  Abnormal   Collection Time: 07/02/23  2:33 AM  Result Value Ref Range   Lactic Acid, Venous 4.9 (HH) 0.5 - 1.9 mmol/L   Comment NOTIFIED PHYSICIAN   CBC with Differential     Status: Abnormal   Collection Time: 07/02/23  4:07 AM  Result Value Ref Range   WBC 17.6 (H) 4.0 - 10.5 K/uL   RBC 3.19 (L) 4.22 - 5.81 MIL/uL   Hemoglobin 11.6 (L) 13.0 - 17.0 g/dL   HCT 84.1 (L) 32.4 - 40.1 %   MCV 106.6 (H) 80.0 - 100.0 fL   MCH 36.4 (H) 26.0 - 34.0 pg   MCHC 34.1 30.0 - 36.0 g/dL   RDW 02.7 25.3 - 66.4 %   Platelets 149 (L) 150 - 400 K/uL   nRBC 0.0 0.0 - 0.2 %   Neutrophils Relative % 86 %   Neutro Abs 15.1 (H) 1.7 - 7.7 K/uL   Lymphocytes Relative 8 %   Lymphs Abs 1.5 0.7 - 4.0 K/uL   Monocytes Relative 5 %   Monocytes Absolute 0.8 0.1 - 1.0 K/uL   Eosinophils Relative 0 %   Eosinophils Absolute 0.0 0.0 - 0.5 K/uL   Basophils Relative 0 %   Basophils Absolute 0.1 0.0 - 0.1 K/uL   Immature Granulocytes 1 %   Abs Immature Granulocytes 0.13 (H) 0.00 - 0.07 K/uL    Comment: Performed at [facility], [address] [place], [place] [address]  Urinalysis, Routine w reflex microscopic -Urine, Clean Catch     Status: Abnormal   Collection Time: 07/02/23  5:42 AM  Result Value Ref Range   Color, Urine YELLOW YELLOW   APPearance CLEAR CLEAR   Specific Gravity, Urine 1.024 1.005 - 1.030   pH 5.0 5.0 - 8.0   Glucose, UA NEGATIVE NEGATIVE mg/dL   Hgb urine dipstick SMALL (A) NEGATIVE   Bilirubin Urine NEGATIVE NEGATIVE   Ketones, ur NEGATIVE NEGATIVE mg/dL   Protein, ur NEGATIVE NEGATIVE mg/dL   Nitrite NEGATIVE NEGATIVE   Leukocytes,Ua NEGATIVE NEGATIVE   RBC / HPF 0-5 0 - 5 RBC/hpf   WBC, UA 0-5 0 - 5 WBC/hpf   Bacteria, UA NONE SEEN NONE SEEN   Squamous Epithelial / HPF 0-5 0 - 5 /HPF   Mucus PRESENT    Hyaline Casts, UA PRESENT     Comment: Performed at [facility], [address], [place], [place] [address]   *Note: Due to a large number of results and/or  encounters for the requested time period, some results have not been displayed. A complete set of results can be found in Results Review.   CT CHEST ABDOMEN PELVIS W CONTRAST  Result Date: 07/02/2023 CLINICAL DATA:  Polytrauma, blunt. Fall. Large hematoma to right flank/lower back area. EXAM: CT CHEST, ABDOMEN, AND PELVIS WITH CONTRAST TECHNIQUE: Multidetector CT imaging of the chest, abdomen and pelvis was performed following the standard protocol during bolus administration of intravenous contrast. RADIATION DOSE REDUCTION: This exam was performed according to the departmental dose-optimization program  which includes automated exposure control, adjustment of the mA and/or kV according to patient size and/or use of iterative reconstruction technique. CONTRAST:  60mL OMNIPAQUE IOHEXOL 350 MG/ML SOLN COMPARISON:  11/22/2022. FINDINGS: CT CHEST FINDINGS Cardiovascular: The heart is normal in size and there is no pericardial effusion. Scattered coronary artery calcifications are noted. There is atherosclerotic calcification of the aorta without evidence of aneurysm. The pulmonary trunk is normal in caliber. Mediastinum/Nodes: Calcified lymph nodes are present in the mediastinum and left hilum. No axillary lymphadenopathy. The thyroid gland, trachea, and esophagus are within normal limits. There is a small hiatal hernia. Lungs/Pleura: Mild apical pleural scarring is present bilaterally. Atelectasis is noted bilaterally. No effusion or pneumothorax. Scattered calcified granuloma are present bilaterally. Musculoskeletal: Cervical spinal fusion hardware is noted. Degenerative changes are present in the thoracic spine. Spinal fusion hardware is noted in the lower thoracic spine. CT ABDOMEN PELVIS FINDINGS Hepatobiliary: No focal liver abnormality is seen. Fatty infiltration of the liver is noted. No gallstones, gallbladder wall thickening, or biliary dilatation. Pancreas: Unremarkable. No pancreatic ductal dilatation  or surrounding inflammatory changes. Spleen: Normal in size without focal abnormality. Adrenals/Urinary Tract: The adrenal glands are within normal limits. The kidneys enhance symmetrically. There are bilateral renal cysts. No renal calculus or hydronephrosis is seen. The bladder is unremarkable. Stomach/Bowel: There is a small hiatal hernia. Gastric bypass surgical changes are present. Appendix is not seen. No evidence of bowel wall thickening, distention, or inflammatory changes. No free air or pneumatosis. Scattered diverticula are present along the colon without evidence of diverticulitis. Vascular/Lymphatic: Aortic atherosclerosis. No enlarged abdominal or pelvic lymph nodes. Reproductive: Prostate is unremarkable. Other: No abdominopelvic ascites. Subcutaneous fat stranding is noted over the right flank. There is a hematoma in the subcutaneous tissues of the right flank measuring 10.5 x 12.5 x 3.1 cm. Contrast extravasation is present within the hematoma suggesting active hemorrhage. Musculoskeletal: A stable compression deformity is noted in the superior endplate at L1. Spinal fusion hardware is noted from T11 to L5. No acute fracture is seen. IMPRESSION: 1. Large hematoma in the subcutaneous tissues of the right flank measuring 12.5 x 10.5 x 3.1 cm. Contrast extravasation is present within the hematoma suggesting active hemorrhage. 2. No evidence of acute fracture or solid organ injury. 3. Small hiatal hernia. 4. Hepatic steatosis. 5. Diverticulosis without diverticulitis. 6. Coronary artery calcification. 7. Aortic atherosclerosis. Electronically Signed   By: Thornell Sartorius M.D.   On: 07/02/2023 03:26   CT HEAD WO CONTRAST  Result Date: 07/02/2023 CLINICAL DATA:  Felt dizzy after couple of glasses of wine. Fell backwards. Unsure if loss of consciousness. Large hematoma to right flank and lower back area. Initially hypotensive. EXAM: CT HEAD WITHOUT CONTRAST CT CERVICAL SPINE WITHOUT CONTRAST TECHNIQUE:  Multidetector CT imaging of the head and cervical spine was performed following the standard protocol without intravenous contrast. Multiplanar CT image reconstructions of the cervical spine were also generated. RADIATION DOSE REDUCTION: This exam was performed according to the departmental dose-optimization program which includes automated exposure control, adjustment of the mA and/or kV according to patient size and/or use of iterative reconstruction technique. COMPARISON:  MRI cervical spine 07/26/2019 and CT head 12/17/2021 FINDINGS: CT HEAD FINDINGS Brain: No intracranial hemorrhage, mass effect, or evidence of acute infarct. No hydrocephalus. No extra-axial fluid collection. Generalized cerebral atrophy and chronic small vessel ischemic disease. Vascular: No hyperdense vessel. Intracranial arterial calcification. Skull: No fracture or focal lesion. Contusion to the posterior midline scalp. Sinuses/Orbits: Bilateral mastoid effusions. Postoperative change  about the paranasal sinuses. Chronic left maxillary and sphenoid sinusitis. Other: None. CT CERVICAL SPINE FINDINGS Alignment: No evidence of traumatic malalignment. Chronic anterolisthesis of C7. Skull base and vertebrae: No acute fracture. No primary bone lesion or focal pathologic process. Soft tissues and spinal canal: No prevertebral fluid or swelling. No visible canal hematoma. Disc levels: ACDF C3-C7. Multilevel facet arthropathy. No severe spinal canal narrowing. Upper chest: No acute abnormality. Other: Carotid calcification. IMPRESSION: 1. No acute intracranial abnormality. 2. No acute fracture in the cervical spine. Electronically Signed   By: Minerva Fester M.D.   On: 07/02/2023 03:18   CT CERVICAL SPINE WO CONTRAST  Result Date: 07/02/2023 CLINICAL DATA:  Felt dizzy after couple of glasses of wine. Fell backwards. Unsure if loss of consciousness. Large hematoma to right flank and lower back area. Initially hypotensive. EXAM: CT HEAD WITHOUT  CONTRAST CT CERVICAL SPINE WITHOUT CONTRAST TECHNIQUE: Multidetector CT imaging of the head and cervical spine was performed following the standard protocol without intravenous contrast. Multiplanar CT image reconstructions of the cervical spine were also generated. RADIATION DOSE REDUCTION: This exam was performed according to the departmental dose-optimization program which includes automated exposure control, adjustment of the mA and/or kV according to patient size and/or use of iterative reconstruction technique. COMPARISON:  MRI cervical spine 07/26/2019 and CT head 12/17/2021 FINDINGS: CT HEAD FINDINGS Brain: No intracranial hemorrhage, mass effect, or evidence of acute infarct. No hydrocephalus. No extra-axial fluid collection. Generalized cerebral atrophy and chronic small vessel ischemic disease. Vascular: No hyperdense vessel. Intracranial arterial calcification. Skull: No fracture or focal lesion. Contusion to the posterior midline scalp. Sinuses/Orbits: Bilateral mastoid effusions. Postoperative change about the paranasal sinuses. Chronic left maxillary and sphenoid sinusitis. Other: None. CT CERVICAL SPINE FINDINGS Alignment: No evidence of traumatic malalignment. Chronic anterolisthesis of C7. Skull base and vertebrae: No acute fracture. No primary bone lesion or focal pathologic process. Soft tissues and spinal canal: No prevertebral fluid or swelling. No visible canal hematoma. Disc levels: ACDF C3-C7. Multilevel facet arthropathy. No severe spinal canal narrowing. Upper chest: No acute abnormality. Other: Carotid calcification. IMPRESSION: 1. No acute intracranial abnormality. 2. No acute fracture in the cervical spine. Electronically Signed   By: Minerva Fester M.D.   On: 07/02/2023 03:18   DG Hip Unilat W or Wo Pelvis 2-3 Views Right  Result Date: 07/02/2023 CLINICAL DATA:  Fall. EXAM: DG HIP (WITH OR WITHOUT PELVIS) 2-3V RIGHT COMPARISON:  None Available. FINDINGS: There is no evidence of  hip fracture or dislocation. Mild degenerative changes are present in the hips bilaterally and sacroiliac joints. Lumbar spinal fusion hardware is noted. IMPRESSION: No acute fracture or dislocation. Electronically Signed   By: Thornell Sartorius M.D.   On: 07/02/2023 02:48   DG Chest Port 1 View  Result Date: 07/02/2023 CLINICAL DATA:  Trauma from fall EXAM: PORTABLE CHEST 1 VIEW COMPARISON:  02/01/2023 FINDINGS: Stable cardiomediastinal silhouette. Aortic atherosclerotic calcification. No focal consolidation, pleural effusion, or pneumothorax. No displaced rib fractures. Chronic left rib fractures. No displaced acute rib fracture. Cervical thoracolumbar spinal fusion hardware. IMPRESSION: No active disease. Electronically Signed   By: Minerva Fester M.D.   On: 07/02/2023 02:47    Pending Labs Unresulted Labs (From admission, onward)     Start     Ordered   07/03/23 0500  CBC  Tomorrow morning,   R        07/02/23 1012   07/03/23 0500  Basic metabolic panel  Tomorrow morning,   R  07/02/23 1012            Vitals/Pain Today's Vitals   07/02/23 0830 07/02/23 1000 07/02/23 1031 07/02/23 1043  BP: 109/74 129/84    Pulse: 86 90    Resp: 11 14    Temp:    98.3 F (36.8 C)  TempSrc:    Oral  SpO2: 95% 99%    Weight:      Height:      PainSc:   9      Isolation Precautions No active isolations  Medications Medications  acetaminophen (TYLENOL) tablet 1,000 mg (1,000 mg Oral Given 07/02/23 0624)  methocarbamol (ROBAXIN) tablet 1,000 mg (1,000 mg Oral Given 07/02/23 0624)  lidocaine (LIDODERM) 5 % 1 patch (1 patch Transdermal Patch Applied 07/02/23 0835)  oxyCODONE (Oxy IR/ROXICODONE) immediate release tablet 5-10 mg (10 mg Oral Given 07/02/23 1033)  docusate sodium (COLACE) capsule 100 mg (has no administration in time range)  polyethylene glycol (MIRALAX / GLYCOLAX) packet 17 g (has no administration in time range)  metoprolol tartrate (LOPRESSOR) injection 5 mg (has no  administration in time range)  hydrALAZINE (APRESOLINE) injection 10 mg (has no administration in time range)  ondansetron (ZOFRAN) injection 4 mg (4 mg Intravenous Given 07/02/23 0630)  morphine (PF) 2 MG/ML injection 2 mg (has no administration in time range)  fentaNYL (SUBLIMAZE) injection 50 mcg (50 mcg Intravenous Given 07/02/23 0234)  iohexol (OMNIPAQUE) 350 MG/ML injection 60 mL (60 mLs Intravenous Contrast Given 07/02/23 0300)  fentaNYL (SUBLIMAZE) injection 50 mcg (50 mcg Intravenous Given 07/02/23 0401)  sodium chloride 0.9 % bolus 500 mL ( Intravenous Stopped 07/02/23 0505)  morphine (PF) 2 MG/ML injection 2 mg (2 mg Intravenous Given 07/02/23 0626)  oxyCODONE (Oxy IR/ROXICODONE) immediate release tablet 5 mg (5 mg Oral Given 07/02/23 0623)  sodium chloride 0.9 % bolus 500 mL (0 mLs Intravenous Stopped 07/02/23 0828)    Mobility walks     Focused Assessments .   R Recommendations: See Admitting Provider Note  Report given to:   Additional Notes: .

## 2023-07-02 NOTE — Progress Notes (Signed)
Level 2. Fall on thinners. Chaplain offers compassionate presence until pt is taken to CT.

## 2023-07-02 NOTE — ED Provider Notes (Signed)
[place] EMERGENCY DEPARTMENT AT [facility] Provider Note   CSN: [id number] Arrival date & time: 07/02/23  0206     History  Chief Complaint  Patient presents with   Justin Patel is a 72 y.o. male.  The history is provided by the patient, the EMS personnel and medical records.  Fall  Justin Patel is a 72 y.o. male who presents to the Emergency Department complaining of fall.  He presents to the ED for evaluation of injuries following a fall.  He was urinating when he became dizzy and fell backwards, striking the tub.  Unclear if head injury. For EMS he complained of neck pain, flank pain.  EMS reports small hematoma on their arrival, expanding in transit to ED.  EMS also report bp 90s systolic, when transferred to stair chair he had syncopal episode, lowest BP 80s systolic.  Pt reports drinking a couple of glasses of wine tonight.      Home Medications Prior to Admission medications   Medication Sig Start Date End Date Taking? Authorizing Provider  acetaminophen (TYLENOL) 325 MG tablet Take 650 mg by mouth every 6 (six) hours as needed for moderate pain.   Yes [provider]  amLODIPine-Valsartan-HCTZ 5-160-25 MG TABS Take 1 tablet by mouth daily.   Yes [provider]  Calcium Carbonate-Vitamin D (CALCIUM 600/VITAMIN D PO) Take 1 tablet by mouth daily.   Yes [provider]  Cholecalciferol 50 MCG (2000 UT) TABS Take 2,000 Units by mouth daily. 01/31/20  Yes [provider]  esomeprazole (NEXIUM) 40 MG capsule Take 1 capsule (40 mg total) by mouth 2 (two) times daily before a meal. 05/02/23  Yes [name], MD  ezetimibe (ZETIA) 10 MG tablet Take 1 tablet (10 mg total) by mouth daily. 01/11/23  Yes [name], MD  furosemide (LASIX) 20 MG tablet Take 20 mg by mouth daily. 06/06/23  Yes [provider]  gabapentin (NEURONTIN) 300 MG capsule TAKE 1 CAPSULE BY MOUTH EVERY MORNING AND TAKE 2  CAPSULES WITH LUNCH AND TAKE 1 CAPSULE AT BEDTIME 02/23/23  Yes [name], MD  HYDROcodone-acetaminophen (NORCO) 7.5-325 MG tablet Take 1 tablet by mouth 4 (four) times daily as needed for moderate pain (pain score 4-6).   Yes [provider]  linagliptin (TRADJENTA) 5 MG TABS tablet Take 1 tablet (5 mg total) by mouth daily. 02/01/23  Yes [name], MD  metoprolol succinate (TOPROL XL) 25 MG 24 hr tablet Take 1 tablet (25 mg total) by mouth daily. 05/25/23  Yes Furth, Cadence H, PA-C  Multiple Vitamin (MULTIVITAMIN) capsule Take 1 capsule by mouth daily. With copper and zinc Patient taking differently: Take 1 capsule by mouth daily. 07/11/18  Yes [name], MD  ondansetron (ZOFRAN-ODT) 4 MG disintegrating tablet Allow 1-2 tablets to dissolve in your mouth every 8 hours as needed for nausea/vomiting 02/08/23  Yes Loleta Rose, MD  primidone (MYSOLINE) 50 MG tablet Take 1 tablet (50 mg total) by mouth at bedtime. 12/08/22  Yes Jaffe, Adam R, DO  rosuvastatin (CRESTOR) 40 MG tablet Take 1 tablet (40 mg total) by mouth daily. 02/01/23  Yes [name], MD  sucralfate (CARAFATE) 1 g tablet TAKE 1 TABLET BY MOUTH 4 TIMES DAILY 04/13/23  Yes [name], MD  tamsulosin (FLOMAX) 0.4 MG CAPS capsule Take 2 capsules (0.8 mg total) by mouth daily. Patient taking differently: Take 0.4 mg by mouth in the  morning and at bedtime. 11/10/22  Yes [name], MD  venlafaxine XR (EFFEXOR-XR) 75 MG 24 hr capsule TAKE ONE CAPSULE EVERY MORNING WITH BREAKFAST 06/28/23  Yes [name], MD      Allergies    Altace [ramipril], Ace inhibitors, Levaquin [levofloxacin in d5w], Lyrica [pregabalin], Metformin, and Trulicity [dulaglutide]    Review of Systems   Review of Systems  All other systems reviewed and are negative.   Physical Exam Updated Vital Signs BP 103/69   Pulse 76   Temp 98.5 F (36.9 C) (Oral)   Resp 11   Ht 5\' 5"  (1.651 m)   Wt 77.1 kg   SpO2  99%   BMI 28.29 kg/m  Physical Exam Vitals and nursing note reviewed.  Constitutional:      Appearance: He is well-developed.  HENT:     Head: Normocephalic and atraumatic.  Cardiovascular:     Rate and Rhythm: Normal rate and regular rhythm.     Heart sounds: No murmur heard. Pulmonary:     Effort: Pulmonary effort is normal. No respiratory distress.     Breath sounds: Normal breath sounds.  Abdominal:     Palpations: Abdomen is soft.     Tenderness: There is no abdominal tenderness. There is no guarding or rebound.  Musculoskeletal:     Comments: Nonpitting edema to BLE.  Mild tenderness to right hip.  Large area of ecchymosis to the right flank with local soft tissue swelling.   Skin:    General: Skin is warm and dry.  Neurological:     Mental Status: He is alert and oriented to person, place, and time.  Psychiatric:        Behavior: Behavior normal.     ED Results / Procedures / Treatments   Labs (all labs ordered are listed, but only abnormal results are displayed) Labs Reviewed  COMPREHENSIVE METABOLIC PANEL - Abnormal; Notable for the following components:      Result Value   Sodium 133 (*)    CO2 18 (*)    Glucose, Bld 185 (*)    Creatinine, Ser 1.71 (*)    Calcium 8.7 (*)    Total Protein 5.3 (*)    Albumin 2.9 (*)    AST 68 (*)    ALT 81 (*)    GFR, Estimated 42 (*)    All other components within normal limits  CBC - Abnormal; Notable for the following components:   WBC 12.1 (*)    RBC 3.49 (*)    Hemoglobin 12.2 (*)    HCT 37.6 (*)    MCV 107.7 (*)    MCH 35.0 (*)    All other components within normal limits  ETHANOL - Abnormal; Notable for the following components:   Alcohol, Ethyl (B) 75 (*)    All other components within normal limits  URINALYSIS, ROUTINE W REFLEX MICROSCOPIC - Abnormal; Notable for the following components:   Hgb urine dipstick SMALL (*)    All other components within normal limits  CBC WITH DIFFERENTIAL/PLATELET - Abnormal;  Notable for the following components:   WBC 17.6 (*)    RBC 3.19 (*)    Hemoglobin 11.6 (*)    HCT 34.0 (*)    MCV 106.6 (*)    MCH 36.4 (*)    Platelets 149 (*)    Neutro Abs 15.1 (*)    Abs Immature Granulocytes 0.13 (*)    All other components within normal limits  I-STAT CHEM 8,  ED - Abnormal; Notable for the following components:   Sodium 134 (*)    Creatinine, Ser 1.90 (*)    Glucose, Bld 176 (*)    Calcium, Ion 1.01 (*)    TCO2 18 (*)    Hemoglobin 12.6 (*)    HCT 37.0 (*)    All other components within normal limits  I-STAT CG4 LACTIC ACID, ED - Abnormal; Notable for the following components:   Lactic Acid, Venous 4.9 (*)    All other components within normal limits  PROTIME-INR  SAMPLE TO BLOOD BANK    EKG EKG Interpretation Date/Time:  Saturday July 02 2023 02:21:52 EST Ventricular Rate:  69 PR Interval:  148 QRS Duration:  89 QT Interval:  449 QTC Calculation: 481 R Axis:   54  Text Interpretation: Sinus rhythm Borderline low voltage, extremity leads Borderline prolonged QT interval Confirmed by Tilden Fossa [phone]) on 07/02/2023 2:33:02 AM  Radiology CT CHEST ABDOMEN PELVIS W CONTRAST  Result Date: 07/02/2023 CLINICAL DATA:  Polytrauma, blunt. Fall. Large hematoma to right flank/lower back area. EXAM: CT CHEST, ABDOMEN, AND PELVIS WITH CONTRAST TECHNIQUE: Multidetector CT imaging of the chest, abdomen and pelvis was performed following the standard protocol during bolus administration of intravenous contrast. RADIATION DOSE REDUCTION: This exam was performed according to the departmental dose-optimization program which includes automated exposure control, adjustment of the mA and/or kV according to patient size and/or use of iterative reconstruction technique. CONTRAST:  60mL OMNIPAQUE IOHEXOL 350 MG/ML SOLN COMPARISON:  11/22/2022. FINDINGS: CT CHEST FINDINGS Cardiovascular: The heart is normal in size and there is no pericardial effusion. Scattered  coronary artery calcifications are noted. There is atherosclerotic calcification of the aorta without evidence of aneurysm. The pulmonary trunk is normal in caliber. Mediastinum/Nodes: Calcified lymph nodes are present in the mediastinum and left hilum. No axillary lymphadenopathy. The thyroid gland, trachea, and esophagus are within normal limits. There is a small hiatal hernia. Lungs/Pleura: Mild apical pleural scarring is present bilaterally. Atelectasis is noted bilaterally. No effusion or pneumothorax. Scattered calcified granuloma are present bilaterally. Musculoskeletal: Cervical spinal fusion hardware is noted. Degenerative changes are present in the thoracic spine. Spinal fusion hardware is noted in the lower thoracic spine. CT ABDOMEN PELVIS FINDINGS Hepatobiliary: No focal liver abnormality is seen. Fatty infiltration of the liver is noted. No gallstones, gallbladder wall thickening, or biliary dilatation. Pancreas: Unremarkable. No pancreatic ductal dilatation or surrounding inflammatory changes. Spleen: Normal in size without focal abnormality. Adrenals/Urinary Tract: The adrenal glands are within normal limits. The kidneys enhance symmetrically. There are bilateral renal cysts. No renal calculus or hydronephrosis is seen. The bladder is unremarkable. Stomach/Bowel: There is a small hiatal hernia. Gastric bypass surgical changes are present. Appendix is not seen. No evidence of bowel wall thickening, distention, or inflammatory changes. No free air or pneumatosis. Scattered diverticula are present along the colon without evidence of diverticulitis. Vascular/Lymphatic: Aortic atherosclerosis. No enlarged abdominal or pelvic lymph nodes. Reproductive: Prostate is unremarkable. Other: No abdominopelvic ascites. Subcutaneous fat stranding is noted over the right flank. There is a hematoma in the subcutaneous tissues of the right flank measuring 10.5 x 12.5 x 3.1 cm. Contrast extravasation is present within  the hematoma suggesting active hemorrhage. Musculoskeletal: A stable compression deformity is noted in the superior endplate at L1. Spinal fusion hardware is noted from T11 to L5. No acute fracture is seen. IMPRESSION: 1. Large hematoma in the subcutaneous tissues of the right flank measuring 12.5 x 10.5 x 3.1 cm. Contrast extravasation is present  within the hematoma suggesting active hemorrhage. 2. No evidence of acute fracture or solid organ injury. 3. Small hiatal hernia. 4. Hepatic steatosis. 5. Diverticulosis without diverticulitis. 6. Coronary artery calcification. 7. Aortic atherosclerosis. Electronically Signed   By: Thornell Sartorius M.D.   On: 07/02/2023 03:26   CT HEAD WO CONTRAST  Result Date: 07/02/2023 CLINICAL DATA:  Felt dizzy after couple of glasses of wine. Fell backwards. Unsure if loss of consciousness. Large hematoma to right flank and lower back area. Initially hypotensive. EXAM: CT HEAD WITHOUT CONTRAST CT CERVICAL SPINE WITHOUT CONTRAST TECHNIQUE: Multidetector CT imaging of the head and cervical spine was performed following the standard protocol without intravenous contrast. Multiplanar CT image reconstructions of the cervical spine were also generated. RADIATION DOSE REDUCTION: This exam was performed according to the departmental dose-optimization program which includes automated exposure control, adjustment of the mA and/or kV according to patient size and/or use of iterative reconstruction technique. COMPARISON:  MRI cervical spine 07/26/2019 and CT head 12/17/2021 FINDINGS: CT HEAD FINDINGS Brain: No intracranial hemorrhage, mass effect, or evidence of acute infarct. No hydrocephalus. No extra-axial fluid collection. Generalized cerebral atrophy and chronic small vessel ischemic disease. Vascular: No hyperdense vessel. Intracranial arterial calcification. Skull: No fracture or focal lesion. Contusion to the posterior midline scalp. Sinuses/Orbits: Bilateral mastoid effusions.  Postoperative change about the paranasal sinuses. Chronic left maxillary and sphenoid sinusitis. Other: None. CT CERVICAL SPINE FINDINGS Alignment: No evidence of traumatic malalignment. Chronic anterolisthesis of C7. Skull base and vertebrae: No acute fracture. No primary bone lesion or focal pathologic process. Soft tissues and spinal canal: No prevertebral fluid or swelling. No visible canal hematoma. Disc levels: ACDF C3-C7. Multilevel facet arthropathy. No severe spinal canal narrowing. Upper chest: No acute abnormality. Other: Carotid calcification. IMPRESSION: 1. No acute intracranial abnormality. 2. No acute fracture in the cervical spine. Electronically Signed   By: Minerva Fester M.D.   On: 07/02/2023 03:18   CT CERVICAL SPINE WO CONTRAST  Result Date: 07/02/2023 CLINICAL DATA:  Felt dizzy after couple of glasses of wine. Fell backwards. Unsure if loss of consciousness. Large hematoma to right flank and lower back area. Initially hypotensive. EXAM: CT HEAD WITHOUT CONTRAST CT CERVICAL SPINE WITHOUT CONTRAST TECHNIQUE: Multidetector CT imaging of the head and cervical spine was performed following the standard protocol without intravenous contrast. Multiplanar CT image reconstructions of the cervical spine were also generated. RADIATION DOSE REDUCTION: This exam was performed according to the departmental dose-optimization program which includes automated exposure control, adjustment of the mA and/or kV according to patient size and/or use of iterative reconstruction technique. COMPARISON:  MRI cervical spine 07/26/2019 and CT head 12/17/2021 FINDINGS: CT HEAD FINDINGS Brain: No intracranial hemorrhage, mass effect, or evidence of acute infarct. No hydrocephalus. No extra-axial fluid collection. Generalized cerebral atrophy and chronic small vessel ischemic disease. Vascular: No hyperdense vessel. Intracranial arterial calcification. Skull: No fracture or focal lesion. Contusion to the posterior  midline scalp. Sinuses/Orbits: Bilateral mastoid effusions. Postoperative change about the paranasal sinuses. Chronic left maxillary and sphenoid sinusitis. Other: None. CT CERVICAL SPINE FINDINGS Alignment: No evidence of traumatic malalignment. Chronic anterolisthesis of C7. Skull base and vertebrae: No acute fracture. No primary bone lesion or focal pathologic process. Soft tissues and spinal canal: No prevertebral fluid or swelling. No visible canal hematoma. Disc levels: ACDF C3-C7. Multilevel facet arthropathy. No severe spinal canal narrowing. Upper chest: No acute abnormality. Other: Carotid calcification. IMPRESSION: 1. No acute intracranial abnormality. 2. No acute fracture in the cervical spine. Electronically  Signed   By: Minerva Fester M.D.   On: 07/02/2023 03:18   DG Hip Unilat W or Wo Pelvis 2-3 Views Right  Result Date: 07/02/2023 CLINICAL DATA:  Fall. EXAM: DG HIP (WITH OR WITHOUT PELVIS) 2-3V RIGHT COMPARISON:  None Available. FINDINGS: There is no evidence of hip fracture or dislocation. Mild degenerative changes are present in the hips bilaterally and sacroiliac joints. Lumbar spinal fusion hardware is noted. IMPRESSION: No acute fracture or dislocation. Electronically Signed   By: Thornell Sartorius M.D.   On: 07/02/2023 02:48   DG Chest Port 1 View  Result Date: 07/02/2023 CLINICAL DATA:  Trauma from fall EXAM: PORTABLE CHEST 1 VIEW COMPARISON:  02/01/2023 FINDINGS: Stable cardiomediastinal silhouette. Aortic atherosclerotic calcification. No focal consolidation, pleural effusion, or pneumothorax. No displaced rib fractures. Chronic left rib fractures. No displaced acute rib fracture. Cervical thoracolumbar spinal fusion hardware. IMPRESSION: No active disease. Electronically Signed   By: Minerva Fester M.D.   On: 07/02/2023 02:47    Procedures Procedures    Medications Ordered in ED Medications  acetaminophen (TYLENOL) tablet 1,000 mg (1,000 mg Oral Given 07/02/23 0624)   methocarbamol (ROBAXIN) tablet 1,000 mg (1,000 mg Oral Given 07/02/23 0624)  lidocaine (LIDODERM) 5 % 1 patch (has no administration in time range)  oxyCODONE (Oxy IR/ROXICODONE) immediate release tablet 5-10 mg (has no administration in time range)  ondansetron (ZOFRAN) injection 4 mg (4 mg Intravenous Given 07/02/23 0630)  fentaNYL (SUBLIMAZE) injection 50 mcg (50 mcg Intravenous Given 07/02/23 0234)  iohexol (OMNIPAQUE) 350 MG/ML injection 60 mL (60 mLs Intravenous Contrast Given 07/02/23 0300)  fentaNYL (SUBLIMAZE) injection 50 mcg (50 mcg Intravenous Given 07/02/23 0401)  sodium chloride 0.9 % bolus 500 mL ( Intravenous Stopped 07/02/23 0505)  morphine (PF) 2 MG/ML injection 2 mg (2 mg Intravenous Given 07/02/23 0626)  oxyCODONE (Oxy IR/ROXICODONE) immediate release tablet 5 mg (5 mg Oral Given 07/02/23 0623)  sodium chloride 0.9 % bolus 500 mL (500 mLs Intravenous New Bag/Given 07/02/23 1610)    ED Course/ Medical Decision Making/ A&P                                 Medical Decision Making Amount and/or Complexity of Data Reviewed Labs: ordered. Radiology: ordered.  Risk Prescription drug management. Decision regarding hospitalization.   Patient here for evaluation following a fall due to near syncopal event with back trauma.  EMS reports an expanding hematoma.  He did have prehospital hypotension, at time of ED arrival there is no hypotension.  He was activated as a level 2 trauma alert at time of ED arrival.  He was treated with fentanyl for pain control.  Trauma images are significant for a left flank subcutaneous hematoma.  Discussed with Dr. Bedelia Person with trauma surgery-recommendation for abdominal binder, repeat CBC.  Plan to admit to trauma service for ongoing care.        Final Clinical Impression(s) / ED Diagnoses Final diagnoses:  Hematoma of right flank, initial encounter  Fall, initial encounter    Rx / DC Orders ED Discharge Orders     None          Tilden Fossa, MD 07/02/23 [phone]

## 2023-07-02 NOTE — Evaluation (Signed)
Occupational Therapy Evaluation Patient Details Name: Justin Patel MRN: [id number] DOB: 04-15-1951 Today's Date: 07/02/2023   History of Present Illness 72 yo male presented to ED 11/23 after fall hitting back with Rt flank hematoma. PMhx: ACDF, anemia, skin CA, depression, DM, HTN, HLD, CKD, CHF, laminectomy, Lt TKA   Clinical Impression   PTA, pt lived at home with his wife and was mod I in ADL and IADL. Upon eval, pt limited by pain and soft orthostatic BP with positional changes. Pt educated regarding purpose and use of abdominal binder and use of log roll to optimize comfort. Evaluation limited by soft BP this date and RN aware. Pt needing up to mod A for LB Adl and min A for STS transfers. Further mobility deferred secondary to low systolic BP. Pending progression with SBP and mobility, recommending discharge home with HHOT.   Supine: 114/98 Sitting: 105/68 HR 80 Unable to maintain standing but on return to sit 68/56 HR 122 Supine 83/65 HR 110       If plan is discharge home, recommend the following: A little help with walking and/or transfers;A little help with bathing/dressing/bathroom;Assistance with cooking/housework;Assist for transportation;Help with stairs or ramp for entrance    Functional Status Assessment  Patient has had a recent decline in their functional status and demonstrates the ability to make significant improvements in function in a reasonable and predictable amount of time.  Equipment Recommendations  BSC/3in1    Recommendations for Other Services       Precautions / Restrictions Precautions Precautions: Other (comment);Fall Precaution Comments: abdominal binder Restrictions Weight Bearing Restrictions: No      Mobility Bed Mobility Overal bed mobility: Needs Assistance Bed Mobility: Rolling, Sidelying to Sit, Sit to Supine Rolling: Modified independent (Device/Increase time) Sidelying to sit: Supervision Supine to sit: Min assist      General bed mobility comments: increased time secondary to pain. Pt benefiting from education regarding log roll technique to optimize pain management    Transfers Overall transfer level: Needs assistance Equipment used: 1 person hand held assist Transfers: Sit to/from Stand Sit to Stand: Min assist           General transfer comment: for rise.      Balance Overall balance assessment: Needs assistance Sitting-balance support: Feet unsupported, Bilateral upper extremity supported, No upper extremity supported Sitting balance-Leahy Scale: Good Sitting balance - Comments: EOB unsupported       Standing balance comment: Dizziness on standing and unable to further assess. Able to rise MIn A and standing <10 seconds CGA-min A                           ADL either performed or assessed with clinical judgement   ADL Overall ADL's : Needs assistance/impaired Eating/Feeding: Modified independent;Sitting;Bed level   Grooming: Set up;Sitting   Upper Body Bathing: Set up;Sitting   Lower Body Bathing: Minimal assistance;Sit to/from stand   Upper Body Dressing : Set up;Sitting   Lower Body Dressing: Sit to/from stand;Moderate assistance   Toilet Transfer: Minimal assistance Toilet Transfer Details (indicate cue type and reason): STS only this session         Functional mobility during ADLs:  (NT secondary to soft BPs)       Vision Baseline Vision/History: 1 Wears glasses Ability to See in Adequate Light: 0 Adequate Patient Visual Report: No change from baseline Vision Assessment?: No apparent visual deficits     Perception Perception: Not tested  Praxis Praxis: Not tested       Pertinent Vitals/Pain Pain Assessment Pain Assessment: Faces Faces Pain Scale: Hurts even more Pain Location: R flank Pain Descriptors / Indicators: Discomfort, Grimacing, Guarding Pain Intervention(s): Limited activity within patient's tolerance, Monitored during  session     Extremity/Trunk Assessment Upper Extremity Assessment Upper Extremity Assessment: Overall WFL for tasks assessed   Lower Extremity Assessment Lower Extremity Assessment: Defer to PT evaluation   Cervical / Trunk Assessment Cervical / Trunk Assessment: Other exceptions Cervical / Trunk Exceptions: forward guarding with pain   Communication Communication Communication: No apparent difficulties   Cognition Arousal: Alert Behavior During Therapy: WFL for tasks assessed/performed Overall Cognitive Status: Within Functional Limits for tasks assessed                                       General Comments       Exercises     Shoulder Instructions      Home Living Family/patient expects to be discharged to:: Private residence Living Arrangements: Spouse/significant other Available Help at Discharge: Family;Available PRN/intermittently Type of Home: House Home Access: Stairs to enter [employer] of Steps: 3 Entrance Stairs-Rails: Right;Left Home Layout: One level     Bathroom Shower/Tub: Producer, television/film/video: Standard     Home Equipment: Cane - single point;Wheelchair - manual          Prior Functioning/Environment Prior Level of Function : Independent/Modified Independent               ADLs Comments: plays the piano at church        OT Problem List: Decreased activity tolerance;Impaired balance (sitting and/or standing);Pain;Decreased strength      OT Treatment/Interventions: Self-care/ADL training;Therapeutic exercise;DME and/or AE instruction;Patient/family education;Balance training;Therapeutic activities    OT Goals(Current goals can be found in the care plan section) Acute Rehab OT Goals Patient Stated Goal: get better OT Goal Formulation: With patient Time For Goal Achievement: 07/16/23 Potential to Achieve Goals: Good  OT Frequency: Min 1X/week    Co-evaluation              AM-PAC OT  "6 Clicks" Daily Activity     Outcome Measure Help from another person eating meals?: None Help from another person taking care of personal grooming?: A Little Help from another person toileting, which includes using toliet, bedpan, or urinal?: A Little Help from another person bathing (including washing, rinsing, drying)?: A Little Help from another person to put on and taking off regular upper body clothing?: A Little Help from another person to put on and taking off regular lower body clothing?: A Little 6 Click Score: 19   End of Session Equipment Utilized During Treatment: Other (comment) (abd binder) Nurse Communication: Mobility status;Other (comment);Patient requests pain meds (orthostatic)  Activity Tolerance: Patient tolerated treatment well Patient left: in bed;with call bell/phone within reach;with bed alarm set  OT Visit Diagnosis: Unsteadiness on feet (R26.81);Muscle weakness (generalized) (M62.81);History of falling (Z91.81);Pain                Time: 1430-1520 OT Time Calculation (min): 50 min Charges:  OT General Charges $OT Visit: 1 Visit OT Evaluation $OT Eval Moderate Complexity: 1 Mod OT Treatments $Self Care/Home Management : 23-37 mins  [name] Deis, OTR/L [facility] Acute Rehabilitation Office: [phone]   Myrla Halsted 07/02/2023, 4:31 PM

## 2023-07-02 NOTE — ED Triage Notes (Signed)
Pt BIB [facility] from home c/o having a couple glasses of wine getting up and feeling dizzy and falling backwards. Pt was unsure if he actually lost consciousness. Pt has a large hematoma to the right flank/lower back area. Pt has a 20g in the left wrist. Pt initially was hypotensive with EMS with the lowest being 84/50. Initially not leveled but per Dr. [name] upgrading to a level 2.

## 2023-07-02 NOTE — ED Notes (Signed)
CCMD notified of cardiac monitoring, pt on cardiac monitor at this time

## 2023-07-02 NOTE — Progress Notes (Signed)
OT Cancellation Note  Patient Details Name: ADRYAN REWERTS MRN: [id number] DOB: 1950-10-29   Cancelled Treatment:    Reason Eval/Treat Not Completed: Other (comment) (low SBP on PT eval. Will return later today for OT eval.)  [name] Deis, OTR/L [facility] Acute Rehabilitation Office: [phone]   Myrla Halsted 07/02/2023, 10:55 AM

## 2023-07-02 NOTE — Evaluation (Signed)
Physical Therapy Evaluation Patient Details Name: Justin Patel MRN: [id number] DOB: 04/14/1951 Today's Date: 07/02/2023  History of Present Illness  72 yo male presented to ED 11/23 after fall hitting back with Rt flank hematoma. PMhx: ACDF, anemia, skin CA, depression, DM, HTN, HLD, CKD, CHF, laminectomy, Lt TKA  Clinical Impression  Pt presenting s/p fall in home, showing decreased bed mobility due to pain, full functional assessment limited by significant drop in BP. Pt reported 2 falls at home in last 6 months. Pt greeted in supine with BP 106/68 able to roll without assist, requested assistance to elevate to EOB, minA for trunk lift. Pt reported dizziness upon sitting and stated this is normal for him. BP in seated was 56/42 (49) , attempted LE exercise EOB to help elevate BP 73/38 (49), pt returned to supine no further mobility, nursing was informed. Pt would benefit from continued skilled physical therapy intervention to maximize safety and independence.        If plan is discharge home, recommend the following: Other (comment);A little help with walking and/or transfers (BP management)   Can travel by private vehicle        Equipment Recommendations None recommended by PT  Recommendations for Other Services  OT consult    Functional Status Assessment Patient has had a recent decline in their functional status and demonstrates the ability to make significant improvements in function in a reasonable and predictable amount of time.     Precautions / Restrictions Precautions Precautions: Other (comment);Fall Precaution Comments: abdominal binder Restrictions Weight Bearing Restrictions: No      Mobility  Bed Mobility Overal bed mobility: Needs Assistance Bed Mobility: Supine to Sit, Rolling Rolling: Modified independent (Device/Increase time)   Supine to sit: Min assist     General bed mobility comments: rolling in bed increased time due to flank pain, supine to sit  minA per pt request to elevate trunk    Transfers                   General transfer comment: unable to assess OOB due to decreased BP    Ambulation/Gait                  Stairs            Wheelchair Mobility     Tilt Bed    Modified Rankin (Stroke Patients Only)       Balance Overall balance assessment: Needs assistance Sitting-balance support: Feet unsupported, Bilateral upper extremity supported, No upper extremity supported Sitting balance-Leahy Scale: Good Sitting balance - Comments: EOB unsupported       Standing balance comment: unassessed                             Pertinent Vitals/Pain Pain Assessment Pain Assessment: Faces Faces Pain Scale: Hurts a little bit Pain Location: R flank Pain Descriptors / Indicators: Discomfort, Grimacing Pain Intervention(s): Limited activity within patient's tolerance, Monitored during session    Home Living Family/patient expects to be discharged to:: Private residence Living Arrangements: Spouse/significant other Available Help at Discharge: Family;Available PRN/intermittently Type of Home: House Home Access: Stairs to enter Entrance Stairs-Rails: Doctor, general practice of Steps: 3   Home Layout: One level Home Equipment: Cane - single point;Wheelchair - manual      Prior Function Prior Level of Function : Independent/Modified Independent  Extremity/Trunk Assessment   Upper Extremity Assessment Upper Extremity Assessment: Defer to OT evaluation    Lower Extremity Assessment Lower Extremity Assessment: Overall WFL for tasks assessed    Cervical / Trunk Assessment Cervical / Trunk Assessment: Other exceptions Cervical / Trunk Exceptions: forward guarding with pain  Communication   Communication Communication: No apparent difficulties  Cognition Arousal: Alert Behavior During Therapy: WFL for tasks assessed/performed Overall  Cognitive Status: Within Functional Limits for tasks assessed                                 General Comments: wife present during session, reported no cognitive difference        General Comments      Exercises General Exercises - Lower Extremity Long Arc Quad: AROM, Both, 10 reps, Seated   Assessment/Plan    PT Assessment Patient needs continued PT services  PT Problem List Decreased strength;Decreased mobility;Decreased activity tolerance       PT Treatment Interventions DME instruction;Gait training;Stair training;Functional mobility training;Therapeutic activities;Therapeutic exercise;Balance training;Patient/family education    PT Goals (Current goals can be found in the Care Plan section)  Acute Rehab PT Goals Patient Stated Goal: return home without pain PT Goal Formulation: With patient Time For Goal Achievement: 07/16/23 Potential to Achieve Goals: Good    Frequency Min 1X/week     Co-evaluation               AM-PAC PT "6 Clicks" Mobility  Outcome Measure Help needed turning from your back to your side while in a flat bed without using bedrails?: None Help needed moving from lying on your back to sitting on the side of a flat bed without using bedrails?: A Little Help needed moving to and from a bed to a chair (including a wheelchair)?: A Little Help needed standing up from a chair using your arms (e.g., wheelchair or bedside chair)?: A Little Help needed to walk in hospital room?: A Little Help needed climbing 3-5 steps with a railing? : A Little 6 Click Score: 19    End of Session   Activity Tolerance: Treatment limited secondary to medical complications (Comment) Patient left: in bed;with call bell/phone within reach;with family/visitor present Nurse Communication: Other (comment);Mobility status (deacreased MAP of 49) PT Visit Diagnosis: Unsteadiness on feet (R26.81);Other abnormalities of gait and mobility (R26.89);History of  falling (Z91.81);Difficulty in walking, not elsewhere classified (R26.2)    Time: 8657-8469 PT Time Calculation (min) (ACUTE ONLY): 14 min   Charges:   PT Evaluation $PT Eval Low Complexity: 1 Low   PT General Charges $$ ACUTE PT VISIT: 1 Visit         Andrey Farmer SPT Secure chat preferred   Darlin Drop 07/02/2023, 9:19 AM

## 2023-07-02 NOTE — Progress Notes (Signed)
Transition of Care [facility]) - CAGE-AID Screening   Patient Details  Name: Justin Patel MRN: [id number] Date of Birth: 1951-05-26  Hewitt Shorts, RN Trauma Response Nurse Phone Number: [phone] 07/02/2023, 3:02 PM   Clinical Narrative:    CAGE-AID Screening:    Have You Ever Felt You Ought to Cut Down on Your Drinking or Drug Use?: No Have People Annoyed You By [employer] Your Drinking Or Drug Use?: No Have You Felt Bad Or Guilty About Your Drinking Or Drug Use?: No Have You Ever Had a Drink or Used Drugs First Thing In The Morning to Steady Your Nerves or to Get Rid of a Hangover?: No CAGE-AID Score: 0  Substance Abuse Education Offered: No (Pt declines services, states he "only drinks at home, but not every day")

## 2023-07-03 DIAGNOSIS — Z87442 Personal history of urinary calculi: Secondary | ICD-10-CM | POA: Diagnosis not present

## 2023-07-03 DIAGNOSIS — I130 Hypertensive heart and chronic kidney disease with heart failure and stage 1 through stage 4 chronic kidney disease, or unspecified chronic kidney disease: Secondary | ICD-10-CM | POA: Diagnosis present

## 2023-07-03 DIAGNOSIS — R109 Unspecified abdominal pain: Secondary | ICD-10-CM | POA: Diagnosis present

## 2023-07-03 DIAGNOSIS — D539 Nutritional anemia, unspecified: Secondary | ICD-10-CM | POA: Diagnosis present

## 2023-07-03 DIAGNOSIS — F32A Depression, unspecified: Secondary | ICD-10-CM | POA: Diagnosis present

## 2023-07-03 DIAGNOSIS — Y9389 Activity, other specified: Secondary | ICD-10-CM | POA: Diagnosis not present

## 2023-07-03 DIAGNOSIS — N400 Enlarged prostate without lower urinary tract symptoms: Secondary | ICD-10-CM | POA: Diagnosis not present

## 2023-07-03 DIAGNOSIS — W1839XA Other fall on same level, initial encounter: Secondary | ICD-10-CM | POA: Diagnosis present

## 2023-07-03 DIAGNOSIS — E1122 Type 2 diabetes mellitus with diabetic chronic kidney disease: Secondary | ICD-10-CM | POA: Diagnosis present

## 2023-07-03 DIAGNOSIS — F419 Anxiety disorder, unspecified: Secondary | ICD-10-CM | POA: Diagnosis present

## 2023-07-03 DIAGNOSIS — E119 Type 2 diabetes mellitus without complications: Secondary | ICD-10-CM | POA: Diagnosis not present

## 2023-07-03 DIAGNOSIS — D62 Acute posthemorrhagic anemia: Secondary | ICD-10-CM | POA: Diagnosis not present

## 2023-07-03 DIAGNOSIS — I5032 Chronic diastolic (congestive) heart failure: Secondary | ICD-10-CM | POA: Diagnosis present

## 2023-07-03 DIAGNOSIS — S7001XA Contusion of right hip, initial encounter: Secondary | ICD-10-CM | POA: Diagnosis present

## 2023-07-03 DIAGNOSIS — I10 Essential (primary) hypertension: Secondary | ICD-10-CM | POA: Diagnosis not present

## 2023-07-03 DIAGNOSIS — M545 Low back pain, unspecified: Secondary | ICD-10-CM | POA: Diagnosis not present

## 2023-07-03 DIAGNOSIS — I951 Orthostatic hypotension: Secondary | ICD-10-CM | POA: Diagnosis not present

## 2023-07-03 DIAGNOSIS — E785 Hyperlipidemia, unspecified: Secondary | ICD-10-CM | POA: Diagnosis not present

## 2023-07-03 DIAGNOSIS — Z7984 Long term (current) use of oral hypoglycemic drugs: Secondary | ICD-10-CM | POA: Diagnosis not present

## 2023-07-03 DIAGNOSIS — N1831 Chronic kidney disease, stage 3a: Secondary | ICD-10-CM | POA: Diagnosis not present

## 2023-07-03 DIAGNOSIS — R251 Tremor, unspecified: Secondary | ICD-10-CM | POA: Diagnosis not present

## 2023-07-03 DIAGNOSIS — Z96652 Presence of left artificial knee joint: Secondary | ICD-10-CM | POA: Diagnosis present

## 2023-07-03 DIAGNOSIS — G894 Chronic pain syndrome: Secondary | ICD-10-CM | POA: Diagnosis not present

## 2023-07-03 DIAGNOSIS — Y92012 Bathroom of single-family (private) house as the place of occurrence of the external cause: Secondary | ICD-10-CM | POA: Diagnosis not present

## 2023-07-03 DIAGNOSIS — R55 Syncope and collapse: Secondary | ICD-10-CM | POA: Diagnosis not present

## 2023-07-03 DIAGNOSIS — K219 Gastro-esophageal reflux disease without esophagitis: Secondary | ICD-10-CM | POA: Diagnosis present

## 2023-07-03 DIAGNOSIS — G319 Degenerative disease of nervous system, unspecified: Secondary | ICD-10-CM | POA: Diagnosis not present

## 2023-07-03 DIAGNOSIS — G250 Essential tremor: Secondary | ICD-10-CM | POA: Diagnosis present

## 2023-07-03 DIAGNOSIS — E876 Hypokalemia: Secondary | ICD-10-CM | POA: Diagnosis present

## 2023-07-03 DIAGNOSIS — Z881 Allergy status to other antibiotic agents status: Secondary | ICD-10-CM | POA: Diagnosis not present

## 2023-07-03 DIAGNOSIS — S301XXA Contusion of abdominal wall, initial encounter: Secondary | ICD-10-CM | POA: Diagnosis not present

## 2023-07-03 DIAGNOSIS — Z9884 Bariatric surgery status: Secondary | ICD-10-CM | POA: Diagnosis not present

## 2023-07-03 DIAGNOSIS — Z79891 Long term (current) use of opiate analgesic: Secondary | ICD-10-CM | POA: Diagnosis not present

## 2023-07-03 DIAGNOSIS — M542 Cervicalgia: Secondary | ICD-10-CM | POA: Diagnosis present

## 2023-07-03 LAB — BASIC METABOLIC PANEL
Anion gap: 11 (ref 5–15)
BUN: 18 mg/dL (ref 8–23)
CO2: 24 mmol/L (ref 22–32)
Calcium: 7.9 mg/dL — ABNORMAL LOW (ref 8.9–10.3)
Chloride: 98 mmol/L (ref 98–111)
Creatinine, Ser: 1.78 mg/dL — ABNORMAL HIGH (ref 0.61–1.24)
GFR, Estimated: 40 mL/min — ABNORMAL LOW (ref >60)
Glucose, Bld: 128 mg/dL — ABNORMAL HIGH (ref 70–99)
Potassium: 3 mmol/L — ABNORMAL LOW (ref 3.5–5.1)
Sodium: 133 mmol/L — ABNORMAL LOW (ref 135–145)

## 2023-07-03 LAB — CBC
HCT: 29.3 % — ABNORMAL LOW (ref 39.0–52.0)
Hemoglobin: 9.7 g/dL — ABNORMAL LOW (ref 13.0–17.0)
MCH: 35.4 pg — ABNORMAL HIGH (ref 26.0–34.0)
MCHC: 33.1 g/dL (ref 30.0–36.0)
MCV: 106.9 fL — ABNORMAL HIGH (ref 80.0–100.0)
Platelets: 100 10*3/uL — ABNORMAL LOW (ref 150–400)
RBC: 2.74 MIL/uL — ABNORMAL LOW (ref 4.22–5.81)
RDW: 13.8 % (ref 11.5–15.5)
WBC: 9.8 10*3/uL (ref 4.0–10.5)
nRBC: 0 % (ref 0.0–0.2)

## 2023-07-03 LAB — HEMOGLOBIN AND HEMATOCRIT, BLOOD
HCT: 27 % — ABNORMAL LOW (ref 39.0–52.0)
Hemoglobin: 8.7 g/dL — ABNORMAL LOW (ref 13.0–17.0)

## 2023-07-03 MED ORDER — POTASSIUM CHLORIDE CRYS ER 20 MEQ PO TBCR
40.0000 meq | EXTENDED_RELEASE_TABLET | Freq: Once | ORAL | Status: AC
  Administered 2023-07-03: 40 meq via ORAL
  Filled 2023-07-03: qty 2

## 2023-07-03 MED ORDER — SODIUM CHLORIDE 0.9 % IV BOLUS
500.0000 mL | Freq: Once | INTRAVENOUS | Status: AC
  Administered 2023-07-03: 500 mL via INTRAVENOUS

## 2023-07-03 NOTE — Progress Notes (Signed)
Assessment & Plan: HD#2 - FFS   R flank hematoma  - Hgb 9.7 mg/dl this AM - binder, TEDs ordered - will add K-pad for comfort - remains orthostatic - will give additional 500 cc bolus - repeat Hgb this afternoon and tomorrow AM  FEN - regular diet DVT - SCDs, hold chemical ppx due to bleeding concerns Dispo - PT/OT, anticipate discharge home when Hgb and BP stable        Darnell Level, MD [facility] practice Office: [phone]        Chief Complaint: Fall  Subjective: Patient in bed, some pain.  Pleasant.  Objective: Vital signs in last 24 hours: Temp:  [97.7 F (36.5 C)-98.3 F (36.8 C)] 98 F (36.7 C) (11/24 0502) Pulse Rate:  [78-96] 78 (11/24 0502) Resp:  [11-20] 16 (11/24 0502) BP: (97-129)/(70-86) 112/70 (11/24 0502) SpO2:  [95 %-100 %] 100 % (11/24 0502) Last BM Date : 07/01/23  Intake/Output from previous day: 11/23 0701 - 11/24 0700 In: 460 [P.O.:460] Out: 600 [Urine:600] Intake/Output this shift: No intake/output data recorded.  Physical Exam: HEENT - sclerae clear, mucous membranes moist Neck - soft R flank - large, firm ecchymotic with hematoma, tender to palpation  Lab Results:  Recent Labs    07/02/23 0407 07/03/23 0631  WBC 17.6* 9.8  HGB 11.6* 9.7*  HCT 34.0* 29.3*  PLT 149* 100*   BMET Recent Labs    07/02/23 0219 07/02/23 0232 07/03/23 0631  NA 133* 134* 133*  K 3.5 3.5 3.0*  CL 100 100 98  CO2 18*  --  24  GLUCOSE 185* 176* 128*  BUN 9 9 18   CREATININE 1.71* 1.90* 1.78*  CALCIUM 8.7*  --  7.9*   PT/INR Recent Labs    07/02/23 0219  LABPROT 14.3  INR 1.1   Comprehensive Metabolic Panel:    Component Value Date/Time   NA 133 (L) 07/03/2023 0631   NA 134 (L) 07/02/2023 0232   NA 142 02/15/2019 0932   NA 140 05/17/2014 0529   NA 141 05/16/2014 0505   K 3.0 (L) 07/03/2023 0631   K 3.5 07/02/2023 0232   K 3.8 05/17/2014 0529   K 4.1 05/16/2014 0505   CL 98 07/03/2023 0631   CL  100 07/02/2023 0232   CL 105 05/17/2014 0529   CL 107 05/16/2014 0505   CO2 24 07/03/2023 0631   CO2 18 (L) 07/02/2023 0219   CO2 27 05/17/2014 0529   CO2 28 05/16/2014 0505   BUN 18 07/03/2023 0631   BUN 9 07/02/2023 0232   BUN 29 (H) 02/15/2019 0932   BUN 10 05/17/2014 0529   BUN 13 05/16/2014 0505   CREATININE 1.78 (H) 07/03/2023 0631   CREATININE 1.90 (H) 07/02/2023 0232   CREATININE 1.18 01/26/2023 1449   CREATININE 1.09 10/04/2022 1401   CREATININE 1.10 04/20/2022 1035   CREATININE 1.34 (H) 12/19/2015 1646   GLUCOSE 128 (H) 07/03/2023 0631   GLUCOSE 176 (H) 07/02/2023 0232   GLUCOSE 112 (H) 05/17/2014 0529   GLUCOSE 109 (H) 05/16/2014 0505   CALCIUM 7.9 (L) 07/03/2023 0631   CALCIUM 8.7 (L) 07/02/2023 0219   CALCIUM 8.2 (L) 05/17/2014 0529   CALCIUM 7.8 (L) 05/16/2014 0505   AST 68 (H) 07/02/2023 0219   AST 28 04/27/2023 1342   AST 40 10/04/2022 1401   AST 16 07/22/2013 0410   AST 15 07/21/2013 0733   ALT 81 (H) 07/02/2023 0219  ALT 36 04/27/2023 1342   ALT 31 10/04/2022 1401   ALT 12 07/22/2013 0410   ALT 16 07/21/2013 0733   ALKPHOS 44 07/02/2023 0219   ALKPHOS 41 04/27/2023 1342   ALKPHOS 44 (L) 07/22/2013 0410   ALKPHOS 50 07/21/2013 0733   BILITOT 0.6 07/02/2023 0219   BILITOT 0.4 04/27/2023 1342   BILITOT 0.4 10/04/2022 1401   BILITOT 0.2 03/14/2019 0000   BILITOT 0.3 02/15/2019 0932   BILITOT 0.4 07/22/2013 0410   BILITOT 0.3 07/21/2013 0733   PROT 5.3 (L) 07/02/2023 0219   PROT 6.0 04/27/2023 1342   PROT 6.5 03/14/2019 0000   PROT 6.9 02/15/2019 0932   PROT 4.5 (L) 07/22/2013 0410   PROT 5.0 (L) 07/21/2013 0733   ALBUMIN 2.9 (L) 07/02/2023 0219   ALBUMIN 3.7 04/27/2023 1342   ALBUMIN 4.1 03/14/2019 0000   ALBUMIN 4.5 02/15/2019 0932   ALBUMIN 2.3 (L) 07/22/2013 0410   ALBUMIN 2.5 (L) 07/21/2013 0733    Studies/Results: CT CHEST ABDOMEN PELVIS W CONTRAST  Result Date: 07/02/2023 CLINICAL DATA:  Polytrauma, blunt. Fall. Large hematoma to  right flank/lower back area. EXAM: CT CHEST, ABDOMEN, AND PELVIS WITH CONTRAST TECHNIQUE: Multidetector CT imaging of the chest, abdomen and pelvis was performed following the standard protocol during bolus administration of intravenous contrast. RADIATION DOSE REDUCTION: This exam was performed according to the departmental dose-optimization program which includes automated exposure control, adjustment of the mA and/or kV according to patient size and/or use of iterative reconstruction technique. CONTRAST:  60mL OMNIPAQUE IOHEXOL 350 MG/ML SOLN COMPARISON:  11/22/2022. FINDINGS: CT CHEST FINDINGS Cardiovascular: The heart is normal in size and there is no pericardial effusion. Scattered coronary artery calcifications are noted. There is atherosclerotic calcification of the aorta without evidence of aneurysm. The pulmonary trunk is normal in caliber. Mediastinum/Nodes: Calcified lymph nodes are present in the mediastinum and left hilum. No axillary lymphadenopathy. The thyroid gland, trachea, and esophagus are within normal limits. There is a small hiatal hernia. Lungs/Pleura: Mild apical pleural scarring is present bilaterally. Atelectasis is noted bilaterally. No effusion or pneumothorax. Scattered calcified granuloma are present bilaterally. Musculoskeletal: Cervical spinal fusion hardware is noted. Degenerative changes are present in the thoracic spine. Spinal fusion hardware is noted in the lower thoracic spine. CT ABDOMEN PELVIS FINDINGS Hepatobiliary: No focal liver abnormality is seen. Fatty infiltration of the liver is noted. No gallstones, gallbladder wall thickening, or biliary dilatation. Pancreas: Unremarkable. No pancreatic ductal dilatation or surrounding inflammatory changes. Spleen: Normal in size without focal abnormality. Adrenals/Urinary Tract: The adrenal glands are within normal limits. The kidneys enhance symmetrically. There are bilateral renal cysts. No renal calculus or hydronephrosis is  seen. The bladder is unremarkable. Stomach/Bowel: There is a small hiatal hernia. Gastric bypass surgical changes are present. Appendix is not seen. No evidence of bowel wall thickening, distention, or inflammatory changes. No free air or pneumatosis. Scattered diverticula are present along the colon without evidence of diverticulitis. Vascular/Lymphatic: Aortic atherosclerosis. No enlarged abdominal or pelvic lymph nodes. Reproductive: Prostate is unremarkable. Other: No abdominopelvic ascites. Subcutaneous fat stranding is noted over the right flank. There is a hematoma in the subcutaneous tissues of the right flank measuring 10.5 x 12.5 x 3.1 cm. Contrast extravasation is present within the hematoma suggesting active hemorrhage. Musculoskeletal: A stable compression deformity is noted in the superior endplate at L1. Spinal fusion hardware is noted from T11 to L5. No acute fracture is seen. IMPRESSION: 1. Large hematoma in the subcutaneous tissues of the right flank  measuring 12.5 x 10.5 x 3.1 cm. Contrast extravasation is present within the hematoma suggesting active hemorrhage. 2. No evidence of acute fracture or solid organ injury. 3. Small hiatal hernia. 4. Hepatic steatosis. 5. Diverticulosis without diverticulitis. 6. Coronary artery calcification. 7. Aortic atherosclerosis. Electronically Signed   By: Thornell Sartorius M.D.   On: 07/02/2023 03:26   CT HEAD WO CONTRAST  Result Date: 07/02/2023 CLINICAL DATA:  Felt dizzy after couple of glasses of wine. Fell backwards. Unsure if loss of consciousness. Large hematoma to right flank and lower back area. Initially hypotensive. EXAM: CT HEAD WITHOUT CONTRAST CT CERVICAL SPINE WITHOUT CONTRAST TECHNIQUE: Multidetector CT imaging of the head and cervical spine was performed following the standard protocol without intravenous contrast. Multiplanar CT image reconstructions of the cervical spine were also generated. RADIATION DOSE REDUCTION: This exam was performed  according to the departmental dose-optimization program which includes automated exposure control, adjustment of the mA and/or kV according to patient size and/or use of iterative reconstruction technique. COMPARISON:  MRI cervical spine 07/26/2019 and CT head 12/17/2021 FINDINGS: CT HEAD FINDINGS Brain: No intracranial hemorrhage, mass effect, or evidence of acute infarct. No hydrocephalus. No extra-axial fluid collection. Generalized cerebral atrophy and chronic small vessel ischemic disease. Vascular: No hyperdense vessel. Intracranial arterial calcification. Skull: No fracture or focal lesion. Contusion to the posterior midline scalp. Sinuses/Orbits: Bilateral mastoid effusions. Postoperative change about the paranasal sinuses. Chronic left maxillary and sphenoid sinusitis. Other: None. CT CERVICAL SPINE FINDINGS Alignment: No evidence of traumatic malalignment. Chronic anterolisthesis of C7. Skull base and vertebrae: No acute fracture. No primary bone lesion or focal pathologic process. Soft tissues and spinal canal: No prevertebral fluid or swelling. No visible canal hematoma. Disc levels: ACDF C3-C7. Multilevel facet arthropathy. No severe spinal canal narrowing. Upper chest: No acute abnormality. Other: Carotid calcification. IMPRESSION: 1. No acute intracranial abnormality. 2. No acute fracture in the cervical spine. Electronically Signed   By: Minerva Fester M.D.   On: 07/02/2023 03:18   CT CERVICAL SPINE WO CONTRAST  Result Date: 07/02/2023 CLINICAL DATA:  Felt dizzy after couple of glasses of wine. Fell backwards. Unsure if loss of consciousness. Large hematoma to right flank and lower back area. Initially hypotensive. EXAM: CT HEAD WITHOUT CONTRAST CT CERVICAL SPINE WITHOUT CONTRAST TECHNIQUE: Multidetector CT imaging of the head and cervical spine was performed following the standard protocol without intravenous contrast. Multiplanar CT image reconstructions of the cervical spine were also  generated. RADIATION DOSE REDUCTION: This exam was performed according to the departmental dose-optimization program which includes automated exposure control, adjustment of the mA and/or kV according to patient size and/or use of iterative reconstruction technique. COMPARISON:  MRI cervical spine 07/26/2019 and CT head 12/17/2021 FINDINGS: CT HEAD FINDINGS Brain: No intracranial hemorrhage, mass effect, or evidence of acute infarct. No hydrocephalus. No extra-axial fluid collection. Generalized cerebral atrophy and chronic small vessel ischemic disease. Vascular: No hyperdense vessel. Intracranial arterial calcification. Skull: No fracture or focal lesion. Contusion to the posterior midline scalp. Sinuses/Orbits: Bilateral mastoid effusions. Postoperative change about the paranasal sinuses. Chronic left maxillary and sphenoid sinusitis. Other: None. CT CERVICAL SPINE FINDINGS Alignment: No evidence of traumatic malalignment. Chronic anterolisthesis of C7. Skull base and vertebrae: No acute fracture. No primary bone lesion or focal pathologic process. Soft tissues and spinal canal: No prevertebral fluid or swelling. No visible canal hematoma. Disc levels: ACDF C3-C7. Multilevel facet arthropathy. No severe spinal canal narrowing. Upper chest: No acute abnormality. Other: Carotid calcification. IMPRESSION: 1. No acute  intracranial abnormality. 2. No acute fracture in the cervical spine. Electronically Signed   By: Minerva Fester M.D.   On: 07/02/2023 03:18   DG Hip Unilat W or Wo Pelvis 2-3 Views Right  Result Date: 07/02/2023 CLINICAL DATA:  Fall. EXAM: DG HIP (WITH OR WITHOUT PELVIS) 2-3V RIGHT COMPARISON:  None Available. FINDINGS: There is no evidence of hip fracture or dislocation. Mild degenerative changes are present in the hips bilaterally and sacroiliac joints. Lumbar spinal fusion hardware is noted. IMPRESSION: No acute fracture or dislocation. Electronically Signed   By: Thornell Sartorius M.D.   On:  07/02/2023 02:48   DG Chest Port 1 View  Result Date: 07/02/2023 CLINICAL DATA:  Trauma from fall EXAM: PORTABLE CHEST 1 VIEW COMPARISON:  02/01/2023 FINDINGS: Stable cardiomediastinal silhouette. Aortic atherosclerotic calcification. No focal consolidation, pleural effusion, or pneumothorax. No displaced rib fractures. Chronic left rib fractures. No displaced acute rib fracture. Cervical thoracolumbar spinal fusion hardware. IMPRESSION: No active disease. Electronically Signed   By: Minerva Fester M.D.   On: 07/02/2023 02:47      Darnell Level 07/03/2023  Patient ID: Justin Patel, male   DOB: November 18, 1950, 72 y.o.   MRN: [id number]

## 2023-07-03 NOTE — Care Management Obs Status (Signed)
MEDICARE OBSERVATION STATUS NOTIFICATION   Patient Details  Name: Justin Patel MRN: [id number] Date of Birth: 1950/10/16   Medicare Observation Status Notification Given:  Yes    Ronny Bacon, RN 07/03/2023, 6:55 AM

## 2023-07-04 LAB — HEMOGLOBIN AND HEMATOCRIT, BLOOD
HCT: 28.9 % — ABNORMAL LOW (ref 39.0–52.0)
Hemoglobin: 9.2 g/dL — ABNORMAL LOW (ref 13.0–17.0)

## 2023-07-04 MED ORDER — HYDROCODONE-ACETAMINOPHEN 7.5-325 MG PO TABS
1.0000 | ORAL_TABLET | ORAL | Status: DC | PRN
  Administered 2023-07-04 – 2023-07-05 (×4): 2 via ORAL
  Administered 2023-07-05: 1 via ORAL
  Administered 2023-07-05 – 2023-07-08 (×17): 2 via ORAL
  Administered 2023-07-08: 1 via ORAL
  Administered 2023-07-08 – 2023-07-09 (×3): 2 via ORAL
  Filled 2023-07-04 (×27): qty 2

## 2023-07-04 MED ORDER — SUCRALFATE 1 G PO TABS
1.0000 g | ORAL_TABLET | Freq: Four times a day (QID) | ORAL | Status: DC
Start: 2023-07-04 — End: 2023-07-11
  Administered 2023-07-04 – 2023-07-11 (×29): 1 g via ORAL
  Filled 2023-07-04 (×29): qty 1

## 2023-07-04 MED ORDER — FUROSEMIDE 20 MG PO TABS
20.0000 mg | ORAL_TABLET | Freq: Every day | ORAL | Status: DC
  Administered 2023-07-04 – 2023-07-05 (×2): 20 mg via ORAL
  Filled 2023-07-04 (×2): qty 1

## 2023-07-04 MED ORDER — OXYCODONE HCL 5 MG PO TABS: 10.0000 mg | ORAL_TABLET | ORAL | Status: DC | PRN

## 2023-07-04 MED ORDER — VITAMIN D 25 MCG (1000 UNIT) PO TABS
2000.0000 [IU] | ORAL_TABLET | Freq: Every day | ORAL | Status: DC
  Administered 2023-07-04 – 2023-07-11 (×8): 2000 [IU] via ORAL
  Filled 2023-07-04 (×8): qty 2

## 2023-07-04 MED ORDER — TAMSULOSIN HCL 0.4 MG PO CAPS
0.4000 mg | ORAL_CAPSULE | Freq: Two times a day (BID) | ORAL | Status: DC
  Administered 2023-07-04 – 2023-07-07 (×7): 0.4 mg via ORAL
  Filled 2023-07-04 (×7): qty 1

## 2023-07-04 MED ORDER — VENLAFAXINE HCL ER 75 MG PO CP24
75.0000 mg | ORAL_CAPSULE | Freq: Every day | ORAL | Status: DC
  Administered 2023-07-04 – 2023-07-11 (×8): 75 mg via ORAL
  Filled 2023-07-04 (×8): qty 1

## 2023-07-04 MED ORDER — EZETIMIBE 10 MG PO TABS
10.0000 mg | ORAL_TABLET | Freq: Every day | ORAL | Status: DC
  Administered 2023-07-04 – 2023-07-11 (×8): 10 mg via ORAL
  Filled 2023-07-04 (×8): qty 1

## 2023-07-04 MED ORDER — MORPHINE SULFATE (PF) 2 MG/ML IV SOLN
1.0000 mg | INTRAVENOUS | Status: DC | PRN
  Administered 2023-07-04 (×3): 1 mg via INTRAVENOUS
  Filled 2023-07-04 (×4): qty 1

## 2023-07-04 MED ORDER — MULTIVITAMINS PO CAPS: 1.0000 | ORAL_CAPSULE | Freq: Every day | ORAL | Status: DC

## 2023-07-04 MED ORDER — GABAPENTIN 300 MG PO CAPS
300.0000 mg | ORAL_CAPSULE | Freq: Two times a day (BID) | ORAL | Status: DC
  Administered 2023-07-04 – 2023-07-11 (×15): 300 mg via ORAL
  Filled 2023-07-04 (×15): qty 1

## 2023-07-04 MED ORDER — ADULT MULTIVITAMIN W/MINERALS CH
1.0000 | ORAL_TABLET | Freq: Every day | ORAL | Status: DC
  Administered 2023-07-04 – 2023-07-11 (×8): 1 via ORAL
  Filled 2023-07-04 (×8): qty 1

## 2023-07-04 MED ORDER — LIDOCAINE 5 % EX PTCH
2.0000 | MEDICATED_PATCH | CUTANEOUS | Status: DC
  Administered 2023-07-05 – 2023-07-11 (×4): 2 via TRANSDERMAL
  Filled 2023-07-04 (×7): qty 2

## 2023-07-04 MED ORDER — ROSUVASTATIN CALCIUM 20 MG PO TABS
40.0000 mg | ORAL_TABLET | Freq: Every day | ORAL | Status: DC
  Administered 2023-07-04 – 2023-07-11 (×8): 40 mg via ORAL
  Filled 2023-07-04 (×8): qty 2

## 2023-07-04 MED ORDER — PANTOPRAZOLE SODIUM 40 MG PO TBEC
40.0000 mg | DELAYED_RELEASE_TABLET | Freq: Every day | ORAL | Status: DC
  Administered 2023-07-04 – 2023-07-11 (×8): 40 mg via ORAL
  Filled 2023-07-04 (×8): qty 1

## 2023-07-04 MED ORDER — GABAPENTIN 300 MG PO CAPS
600.0000 mg | ORAL_CAPSULE | Freq: Every day | ORAL | Status: DC
  Administered 2023-07-04 – 2023-07-10 (×7): 600 mg via ORAL
  Filled 2023-07-04 (×7): qty 2

## 2023-07-04 MED ORDER — OXYCODONE HCL 5 MG PO TABS
10.0000 mg | ORAL_TABLET | ORAL | Status: DC | PRN
Start: 2023-07-04 — End: 2023-07-04

## 2023-07-04 MED ORDER — METHOCARBAMOL 500 MG PO TABS
1000.0000 mg | ORAL_TABLET | Freq: Four times a day (QID) | ORAL | Status: DC
  Administered 2023-07-04 – 2023-07-11 (×29): 1000 mg via ORAL
  Filled 2023-07-04 (×30): qty 2

## 2023-07-04 MED ORDER — PRIMIDONE 50 MG PO TABS
50.0000 mg | ORAL_TABLET | Freq: Every day | ORAL | Status: DC
  Administered 2023-07-04 – 2023-07-10 (×7): 50 mg via ORAL
  Filled 2023-07-04 (×8): qty 1

## 2023-07-04 NOTE — Progress Notes (Signed)
PT Cancellation Note  Patient Details Name: Justin Patel MRN: [id number] DOB: 20-Aug-1950   Cancelled Treatment:    Reason Eval/Treat Not Completed: Other (comment) Pt politely declining due to pain.  Lillia Pauls, PT, DPT Acute Rehabilitation Services Office [phone]    Norval Morton 07/04/2023, 3:11 PM

## 2023-07-04 NOTE — Progress Notes (Signed)
Occupational Therapy Treatment Patient Details Name: Justin Patel MRN: [id number] DOB: 1951/06/09 Today's Date: 07/04/2023   History of present illness 72 yo male presented to ED 11/23 after fall hitting back with Rt flank hematoma. PMhx: ACDF, anemia, skin CA, depression, DM, HTN, HLD, CKD, CHF, laminectomy, Lt TKA   OT comments  Patient received in recliner and patient stating he wanted to go back to bed due to pain. Patient agreeable to OT assisting with in room mobility and transfers before returning to bed. Patient asked to use straight cane with mobility. BP checked while seated in recliner with 119/85 (95) and patient stood at recliner with 111/85 (95). Patient performed mobility in room with straight cane and CGA for safety with BP 118/85 (95) before returning to supine. Patient with increased pain during bed mobility. Patient to continue to be followed by acute OT to increase independence and safety with self care and transfers.       If plan is discharge home, recommend the following:  A little help with walking and/or transfers;A little help with bathing/dressing/bathroom;Assistance with cooking/housework;Assist for transportation;Help with stairs or ramp for entrance   Equipment Recommendations  BSC/3in1    Recommendations for Other Services      Precautions / Restrictions Precautions Precautions: Other (comment);Fall Precaution Comments: abdominal binder Restrictions Weight Bearing Restrictions: No       Mobility Bed Mobility Overal bed mobility: Needs Assistance Bed Mobility: Rolling, Sit to Supine Rolling: Modified independent (Device/Increase time)     Sit to supine: Supervision   General bed mobility comments: increased time due to pain    Transfers Overall transfer level: Needs assistance Equipment used: Straight cane Transfers: Sit to/from Stand, Bed to chair/wheelchair/BSC Sit to Stand: Contact guard assist           General transfer comment:  CGA to stand and for transfers for safety     Balance Overall balance assessment: Needs assistance Sitting-balance support: Feet unsupported, Bilateral upper extremity supported, No upper extremity supported Sitting balance-Leahy Scale: Good Sitting balance - Comments: EOB unsupported   Standing balance support: Single extremity supported, During functional activity Standing balance-Leahy Scale: Fair Standing balance comment: asked to use cane for mobility                           ADL either performed or assessed with clinical judgement   ADL Overall ADL's : Needs assistance/impaired     Grooming: Set up;Sitting   Upper Body Bathing: Set up;Sitting       Upper Body Dressing : Set up;Sitting Upper Body Dressing Details (indicate cue type and reason): gown     Toilet Transfer: Contact guard assist Toilet Transfer Details (indicate cue type and reason): simulated to recliner           General ADL Comments: limited by pain    Extremity/Trunk Assessment              Vision       Perception     Praxis      Cognition Arousal: Alert Behavior During Therapy: WFL for tasks assessed/performed Overall Cognitive Status: Within Functional Limits for tasks assessed                                 General Comments: limited by pain        Exercises      Shoulder Instructions  General Comments BP seated in recliner 119/85, standing 111/85 (95) standing, after ambulating in room 111/85 (95)    Pertinent Vitals/ Pain       Pain Assessment Pain Assessment: Faces Faces Pain Scale: Hurts even more Pain Location: R flank Pain Descriptors / Indicators: Discomfort, Grimacing, Guarding Pain Intervention(s): Limited activity within patient's tolerance, Monitored during session, Repositioned, RN gave pain meds during session  Home Living                                          Prior Functioning/Environment               Frequency  Min 1X/week        Progress Toward Goals  OT Goals(current goals can now be found in the care plan section)  Progress towards OT goals: Progressing toward goals  Acute Rehab OT Goals Patient Stated Goal: less pain OT Goal Formulation: With patient Time For Goal Achievement: 07/16/23 Potential to Achieve Goals: Good ADL Goals Pt Will Perform Grooming: with contact guard assist;standing Pt Will Perform Lower Body Dressing: with contact guard assist;sit to/from stand Pt Will Transfer to Toilet: with contact guard assist;ambulating Additional ADL Goal #1: Pt will demonstrate use of compensatory techniques for pain management during mobility and ADL  Plan      Co-evaluation                 AM-PAC OT "6 Clicks" Daily Activity     Outcome Measure   Help from another person eating meals?: None Help from another person taking care of personal grooming?: A Little Help from another person toileting, which includes using toliet, bedpan, or urinal?: A Little Help from another person bathing (including washing, rinsing, drying)?: A Little Help from another person to put on and taking off regular upper body clothing?: A Little Help from another person to put on and taking off regular lower body clothing?: A Little 6 Click Score: 19    End of Session Equipment Utilized During Treatment: Other (comment) (cane)  OT Visit Diagnosis: Unsteadiness on feet (R26.81);Muscle weakness (generalized) (M62.81);History of falling (Z91.81);Pain Pain - Right/Left: Right Pain - part of body:  (flank)   Activity Tolerance Patient tolerated treatment well   Patient Left in bed;with call bell/phone within reach;with bed alarm set   Nurse Communication Mobility status;Other (comment) (BPs)        Time: 7829-5621 OT Time Calculation (min): 21 min  Charges: OT General Charges $OT Visit: 1 Visit OT Treatments $Therapeutic Activity: 8-22 mins  Alfonse Flavors, OTA Acute  Rehabilitation Services  Office [phone]   Dewain Penning 07/04/2023, 1:09 PM

## 2023-07-04 NOTE — Discharge Summary (Incomplete)
[facility] Discharge Summary   Patient ID: Justin Patel MRN: [id number] DOB/AGE: 05-02-1951 72 y.o.  Admit date: 07/02/2023 Discharge date: 07/05/2023   Discharge Diagnosis Fall from standing Right flank hematoma ABL anemia  Consultants None Imaging: No results found.  Procedures None  HPI:  Justin Patel is a 72 y.o. male PMH CHF, CKD, DM, HTN, HLD, chronic pain on daily narcotics who presented to the ED 07/02/23 s/p fall from standing after voiding. Was standing to urinate, then turned to walk out of the bathroom and then fell, hitting his right back "either on the vanity or on the side of the tub". Denies any prodromal symptoms prior to the fall. Denies hitting his head.   Hospital Course: Patient was found to have a right flank hematoma, otherwise work up negative. He was admitted to the trauma service for pain control and monitoring. H/h monitored and stabilized without the need for blood transfusion. Patient worked with therapies during this admission who recommended home health PT/OT. On 11/26 the patient was voiding well, tolerating diet, ambulating well, pain well controlled, vital signs stable and felt stable for discharge home with his wife.  Patient will follow up as below and knows to call with questions or concerns.    I have personally reviewed the patients medication history on the [place] controlled substance database.   Physical Exam: General: alert, NAD Heart: regular, rate, and rhythm.   Lungs: Respiratory effort nonlabored Abd: soft, ttp over right flank with evolving ecchymosis of right flank, ND MS: BLE symmetrical with no cyanosis, clubbing, or edema.   Allergies as of 07/05/2023       Reactions   Altace [ramipril] Anaphylaxis   Ace Inhibitors Hives   Levaquin [levofloxacin In D5w] Hives   Lyrica [pregabalin] Other (See Comments)   Edema   Metformin Diarrhea   High doses cause diarrhea.   Trulicity [dulaglutide] Nausea And  Vomiting        Medication List     STOP taking these medications    amLODIPine-Valsartan-HCTZ 5-160-25 MG Tabs   metoprolol succinate 25 MG 24 hr tablet Commonly known as: Toprol XL       TAKE these medications    acetaminophen 325 MG tablet Commonly known as: TYLENOL Take 650 mg by mouth every 6 (six) hours as needed for moderate pain.   CALCIUM 600/VITAMIN D PO Take 1 tablet by mouth daily.   Cholecalciferol 50 MCG (2000 UT) Tabs Take 2,000 Units by mouth daily.   docusate sodium 100 MG capsule Commonly known as: COLACE Take 1 capsule (100 mg total) by mouth daily as needed for mild constipation.   esomeprazole 40 MG capsule Commonly known as: NEXIUM Take 1 capsule (40 mg total) by mouth 2 (two) times daily before a meal.   ezetimibe 10 MG tablet Commonly known as: ZETIA Take 1 tablet (10 mg total) by mouth daily.   furosemide 20 MG tablet Commonly known as: LASIX Take 20 mg by mouth daily.   gabapentin 300 MG capsule Commonly known as: NEURONTIN Take 1 capsule (300 mg total) by mouth 2 (two) times daily AND 2 capsules (600 mg total) daily with lunch. What changed: See the new instructions.   HYDROcodone-acetaminophen 7.5-325 MG tablet Commonly known as: NORCO Take 1-2 tablets by mouth every 4 (four) hours as needed for moderate pain (pain score 4-6) or severe pain (pain score 7-10). What changed:  how much to take when to take this reasons to take this  linagliptin 5 MG Tabs tablet Commonly known as: Tradjenta Take 1 tablet (5 mg total) by mouth daily.   Methocarbamol 1000 MG Tabs Take 1,000 mg by mouth 4 (four) times daily.   multivitamin capsule Take 1 capsule by mouth daily. With copper and zinc What changed: additional instructions   ondansetron 4 MG disintegrating tablet Commonly known as: ZOFRAN-ODT Allow 1-2 tablets to dissolve in your mouth every 8 hours as needed for nausea/vomiting   primidone 50 MG tablet Commonly known as:  MYSOLINE Take 1 tablet (50 mg total) by mouth at bedtime.   rosuvastatin 40 MG tablet Commonly known as: CRESTOR Take 1 tablet (40 mg total) by mouth daily.   sucralfate 1 g tablet Commonly known as: CARAFATE TAKE 1 TABLET BY MOUTH 4 TIMES DAILY   tamsulosin 0.4 MG Caps capsule Commonly known as: FLOMAX Take 2 capsules (0.8 mg total) by mouth daily. What changed:  how much to take when to take this   venlafaxine XR 75 MG 24 hr capsule Commonly known as: EFFEXOR-XR TAKE ONE CAPSULE EVERY MORNING WITH BREAKFAST               Durable Medical Equipment  (From admission, onward)           Start     Ordered   07/04/23 1336  For home use only DME 3 n 1  Once        07/04/23 1335              Follow-up Information     [name], MD. Schedule an appointment as soon as possible for a visit in 1 week(s).   Specialty: Family Medicine Why: Call to arrange post-hospitalization follow up appointment with your primary care physician Contact information: [address] [place] [address] [phone]                  Signed: Trixie Deis, [facility] 07/05/2023, 9:33 AM Please see Amion for pager number during day hours 7:00am-4:30pm

## 2023-07-04 NOTE — Progress Notes (Signed)
Progress Note     Subjective: Pt sitting up in chair this AM eating breakfast. Reports pain to right side as severe. He is still having some orthostasis he reports but also reports that this is chronic and was happening prior to this admission. He is tolerating diet.   Objective: Vital signs in last 24 hours: Temp:  [98.1 F (36.7 C)-98.9 F (37.2 C)] 98.6 F (37 C) (11/25 0743) Pulse Rate:  [78-103] 103 (11/25 0743) Resp:  [16-19] 18 (11/25 0743) BP: (109-127)/(63-86) 122/86 (11/25 0743) SpO2:  [97 %-100 %] 100 % (11/25 0743) Last BM Date : 07/01/23  Intake/Output from previous day: 11/24 0701 - 11/25 0700 In: 1000 [P.O.:1000] Out: 200 [Urine:200] Intake/Output this shift: No intake/output data recorded.  PE: General: WD, chronically ill appearing male who is up in chair in NAD HEENT: head is normocephalic, atraumatic.  Sclera are noninjected.  EOMI.  Ears and nose without any masses or lesions.  Mouth is pink and moist Heart: regular, rate, and rhythm.   Lungs: Respiratory effort nonlabored Abd: soft, ttp over right flank with evolving ecchymosis of right flank, ND MS: all 4 extremities are symmetrical with no cyanosis, clubbing, or edema. Psych: A&Ox3 with an appropriate affect.    Lab Results:  Recent Labs    07/02/23 0407 07/03/23 0631 07/03/23 1552 07/04/23 0700  WBC 17.6* 9.8  --   --   HGB 11.6* 9.7* 8.7* 9.2*  HCT 34.0* 29.3* 27.0* 28.9*  PLT 149* 100*  --   --    BMET Recent Labs    07/02/23 0219 07/02/23 0232 07/03/23 0631  NA 133* 134* 133*  K 3.5 3.5 3.0*  CL 100 100 98  CO2 18*  --  24  GLUCOSE 185* 176* 128*  BUN 9 9 18   CREATININE 1.71* 1.90* 1.78*  CALCIUM 8.7*  --  7.9*   PT/INR Recent Labs    07/02/23 0219  LABPROT 14.3  INR 1.1   CMP     Component Value Date/Time   NA 133 (L) 07/03/2023 0631   NA 142 02/15/2019 0932   NA 140 05/17/2014 0529   K 3.0 (L) 07/03/2023 0631   K 3.8 05/17/2014 0529   CL 98 07/03/2023 0631    CL 105 05/17/2014 0529   CO2 24 07/03/2023 0631   CO2 27 05/17/2014 0529   GLUCOSE 128 (H) 07/03/2023 0631   GLUCOSE 112 (H) 05/17/2014 0529   BUN 18 07/03/2023 0631   BUN 29 (H) 02/15/2019 0932   BUN 10 05/17/2014 0529   CREATININE 1.78 (H) 07/03/2023 0631   CREATININE 1.18 01/26/2023 1449   CREATININE 1.10 04/20/2022 1035   CALCIUM 7.9 (L) 07/03/2023 0631   CALCIUM 8.2 (L) 05/17/2014 0529   PROT 5.3 (L) 07/02/2023 0219   PROT 6.5 03/14/2019 0000   PROT 4.5 (L) 07/22/2013 0410   ALBUMIN 2.9 (L) 07/02/2023 0219   ALBUMIN 4.1 03/14/2019 0000   ALBUMIN 2.3 (L) 07/22/2013 0410   AST 68 (H) 07/02/2023 0219   AST 40 10/04/2022 1401   ALT 81 (H) 07/02/2023 0219   ALT 31 10/04/2022 1401   ALT 12 07/22/2013 0410   ALKPHOS 44 07/02/2023 0219   ALKPHOS 44 (L) 07/22/2013 0410   BILITOT 0.6 07/02/2023 0219   BILITOT 0.4 10/04/2022 1401   GFRNONAA 40 (L) 07/03/2023 0631   GFRNONAA >60 01/26/2023 1449   GFRNONAA >60 05/17/2014 0529   GFRNONAA >60 05/01/2014 0928   GFRAA >60 01/30/2020 0129  GFRAA >60 05/17/2014 0529   GFRAA >60 05/01/2014 0928   Lipase     Component Value Date/Time   LIPASE 74 (H) 06/06/2021 1821   LIPASE 164 11/12/2011 1250       Studies/Results: No results found.  Anti-infectives: Anti-infectives (From admission, onward)    None        Assessment/Plan  Fall from standing Right flank hematoma ABL anemia - hgb 9.2 this AM from 8.7 yesterday evening. Stable - ice/binder/k pad prn to side - switched pt back to home norco and increased dose/frequency, reordered home gabapentin - recheck orthostatics today  - HH PT/OT ordered - possibly home this afternoon   FEN: reg diet, SLIV VTE: on hold in setting of ABL anemia  ID: no abx indicated   Dispo: PT/OT. Continue to work on pain control. Possible discharge this afternoon.   LOS: 1 day   I reviewed last 24 h vitals and pain scores, last 48 h intake and output, last 24 h labs and trends, and  therapy notes .   Juliet Rude, [facility] Surgery 07/04/2023, 9:23 AM Please see Amion for pager number during day hours 7:00am-4:30pm

## 2023-07-04 NOTE — TOC Initial Note (Signed)
Transition of Care [facility]) - Initial/Assessment Note    Patient Details  Name: Justin Patel MRN: [id number] Date of Birth: 1951/07/14  [facility] CM/SW Contact:    Glennon Mac, RN Phone Number: 07/04/2023, 3:37 PM  Clinical Narrative:                 72 yo male presented to ED 11/23 after fall hitting back with Rt flank hematoma. PMhx: ACDF, anemia, skin CA, depression, DM, HTN, HLD, CKD, CHF, laminectomy, Lt TKA. PTA, pt independent and living at home with spouse.  PT/OT recommending HH follow up, but patient declines services and recommended DME.  Patient for possible dc today, but states he is having too much pain to go home.  Will notify attending.   Expected Discharge Plan: Home/Self Care Barriers to Discharge: Continued Medical Work up          Expected Discharge Plan and Services   Discharge Planning Services: CM Consult   Living arrangements for the past 2 months: Single Family Home                           HH Arranged: Refused HH          Prior Living Arrangements/Services Living arrangements for the past 2 months: Single Family Home Lives with:: Spouse Patient language and need for interpreter reviewed:: Yes Do you feel safe going back to the place where you live?: Yes      Need for Family Participation in Patient Care: Yes (Comment) Care giver support system in place?: Yes (comment)   Criminal Activity/Legal Involvement Pertinent to Current Situation/Hospitalization: No - Comment as needed  Activities of Daily Living   ADL Screening (condition at time of admission) Independently performs ADLs?: Yes (appropriate for developmental age) Is the patient deaf or have difficulty hearing?: No Does the patient have difficulty seeing, even when wearing glasses/contacts?: No Does the patient have difficulty concentrating, remembering, or making decisions?: No                   Emotional Assessment Appearance:: Appears stated  age Attitude/Demeanor/Rapport: Engaged Affect (typically observed): Accepting Orientation: : Oriented to Self, Oriented to Place, Oriented to  Time, Oriented to Situation      Admission diagnosis:  Right flank hematoma [S30.1XXA] Fall, initial encounter [W19.XXXA] Hematoma of right flank, initial encounter [S30.1XXA] Patient Active Problem List   Diagnosis Date Noted   Right flank hematoma 07/02/2023   Decreased GFR 04/13/2023   Grief 03/15/2023   Sore throat 03/15/2023   Allergic rhinitis 02/01/2023   Acute cough 12/22/2022   Pneumonia 11/20/2022   Tremor 11/10/2022   Lumbar pseudoarthrosis 03/08/2022   Foot fracture 02/02/2022   Thoracic radiculopathy 02/01/2022   Iron deficiency 01/19/2022   Chronic back pain 09/25/2020   Aortic atherosclerosis (HCC) 05/20/2020   Common bile duct dilatation 05/20/2020   Fracture of L1 vertebra (HCC) 01/14/2020   Itchy skin 12/04/2019   Arthralgia 08/17/2019   Mass of submandibular region 08/17/2019   Numbness of face 05/22/2019   Chronic night sweats 03/21/2019   Chronic abdominal pain 02/27/2019   Muscle weakness 08/24/2018   Dyspnea on exertion 08/24/2018   Depression, major, single episode, mild (HCC) 08/24/2018   Macrocytosis without anemia 07/05/2018   RLQ abdominal pain 05/18/2018   CKD stage 3a, GFR 45-59 ml/min (HCC) 05/08/2018   Lumbar scoliosis 04/25/2018   Easy bruising 12/09/2017   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   Macrocytosis 11/02/2017   GERD (gastroesophageal reflux disease) 10/18/2017   Osteoporosis without current pathological fracture 07/20/2017   Vitamin D deficiency 07/20/2017   Osteopenia determined by x-ray 04/25/2017   Rash 03/11/2017   Seborrheic dermatitis 02/24/2017   Intertrigo 02/24/2017   Anxiety 06/25/2016   Cervical facet syndrome 03/15/2016   Facet syndrome, lumbar 03/15/2016   DDD (degenerative disc disease), cervical 03/15/2016   DDD (degenerative disc disease), lumbar  03/15/2016   Other iron deficiency anemias 11/27/2015   Chronic pain syndrome 04/16/2015   Former consumption of alcohol 10/01/2014   Syncope 06/03/2014   BPH (benign prostatic hyperplasia) 11/16/2013   Enlarged prostate 11/08/2013   Anemia 11/08/2013   Renal cyst 08/27/2013   Type 2 diabetes mellitus (HCC) 03/21/2013   Essential hypertension, benign 03/21/2013   Hyperlipidemia 03/21/2013   Overweight (BMI 25.0-29.9) 03/21/2013   PCP:  [name], MD Pharmacy:   [facility] - [place], [place] - [address] [place] [address] Phone: [phone] Fax: [phone]     Social Determinants of Health (SDOH) Social History: SDOH Screenings   Food Insecurity: No Food Insecurity (07/02/2023)  Housing: Low Risk  (07/02/2023)  Transportation Needs: No Transportation Needs (07/02/2023)  Utilities: Not At Risk (07/02/2023)  Depression (PHQ2-9): Low Risk  (04/13/2023)  Financial Resource Strain: Low Risk  (07/06/2022)  Physical Activity: Unknown (06/22/2021)  Social Connections: Unknown (07/06/2022)  Stress: No Stress Concern Present (07/06/2022)  Tobacco Use: Low Risk  (07/02/2023)   SDOH Interventions:     Readmission Risk Interventions     No data to display         Quintella Baton, RN, BSN  Trauma/Neuro ICU Case Manager [phone]

## 2023-07-05 DIAGNOSIS — E119 Type 2 diabetes mellitus without complications: Secondary | ICD-10-CM

## 2023-07-05 DIAGNOSIS — N1831 Chronic kidney disease, stage 3a: Secondary | ICD-10-CM | POA: Diagnosis not present

## 2023-07-05 DIAGNOSIS — N400 Enlarged prostate without lower urinary tract symptoms: Secondary | ICD-10-CM

## 2023-07-05 DIAGNOSIS — M545 Low back pain, unspecified: Secondary | ICD-10-CM

## 2023-07-05 DIAGNOSIS — R251 Tremor, unspecified: Secondary | ICD-10-CM

## 2023-07-05 DIAGNOSIS — S301XXA Contusion of abdominal wall, initial encounter: Secondary | ICD-10-CM | POA: Diagnosis not present

## 2023-07-05 DIAGNOSIS — G894 Chronic pain syndrome: Secondary | ICD-10-CM

## 2023-07-05 DIAGNOSIS — I10 Essential (primary) hypertension: Secondary | ICD-10-CM

## 2023-07-05 DIAGNOSIS — E785 Hyperlipidemia, unspecified: Secondary | ICD-10-CM

## 2023-07-05 LAB — BASIC METABOLIC PANEL
Anion gap: 9 (ref 5–15)
BUN: 16 mg/dL (ref 8–23)
CO2: 25 mmol/L (ref 22–32)
Calcium: 8.9 mg/dL (ref 8.9–10.3)
Chloride: 102 mmol/L (ref 98–111)
Creatinine, Ser: 1.69 mg/dL — ABNORMAL HIGH (ref 0.61–1.24)
GFR, Estimated: 43 mL/min — ABNORMAL LOW (ref >60)
Glucose, Bld: 113 mg/dL — ABNORMAL HIGH (ref 70–99)
Potassium: 2.9 mmol/L — ABNORMAL LOW (ref 3.5–5.1)
Sodium: 136 mmol/L (ref 135–145)

## 2023-07-05 LAB — CBC
HCT: 24.9 % — ABNORMAL LOW (ref 39.0–52.0)
Hemoglobin: 7.9 g/dL — ABNORMAL LOW (ref 13.0–17.0)
MCH: 34.8 pg — ABNORMAL HIGH (ref 26.0–34.0)
MCHC: 31.7 g/dL (ref 30.0–36.0)
MCV: 109.7 fL — ABNORMAL HIGH (ref 80.0–100.0)
Platelets: 94 10*3/uL — ABNORMAL LOW (ref 150–400)
RBC: 2.27 MIL/uL — ABNORMAL LOW (ref 4.22–5.81)
RDW: 14.3 % (ref 11.5–15.5)
WBC: 8 10*3/uL (ref 4.0–10.5)
nRBC: 0 % (ref 0.0–0.2)

## 2023-07-05 MED ORDER — GABAPENTIN 300 MG PO CAPS: ORAL_CAPSULE | ORAL | 0 refills | Status: DC

## 2023-07-05 MED ORDER — SODIUM CHLORIDE 0.9 % IV BOLUS
500.0000 mL | Freq: Once | INTRAVENOUS | Status: AC
  Administered 2023-07-05: 500 mL via INTRAVENOUS

## 2023-07-05 MED ORDER — DOCUSATE SODIUM 100 MG PO CAPS: 100.0000 mg | ORAL_CAPSULE | Freq: Every day | ORAL | Status: DC | PRN

## 2023-07-05 MED ORDER — HYDROCODONE-ACETAMINOPHEN 7.5-325 MG PO TABS: 1.0000 | ORAL_TABLET | ORAL | 0 refills | Status: DC | PRN

## 2023-07-05 MED ORDER — METHOCARBAMOL 1000 MG PO TABS: 1000.0000 mg | ORAL_TABLET | Freq: Four times a day (QID) | ORAL | 1 refills | Status: DC

## 2023-07-05 MED ORDER — POTASSIUM CHLORIDE CRYS ER 20 MEQ PO TBCR
40.0000 meq | EXTENDED_RELEASE_TABLET | Freq: Three times a day (TID) | ORAL | Status: DC
  Administered 2023-07-05: 40 meq via ORAL
  Filled 2023-07-05: qty 2

## 2023-07-05 NOTE — Progress Notes (Signed)
Physical Therapy Treatment Patient Details Name: Justin Patel MRN: [id number] DOB: September 30, 1950 Today's Date: 07/05/2023   History of Present Illness 72 yo male presented to ED 11/23 after fall hitting back with Rt flank hematoma. PMhx: ACDF, anemia, skin CA, depression, DM, HTN, HLD, CKD, CHF, laminectomy, Lt TKA    PT Comments  PT saw pt for second session at encouragement of trauma team, to progress mobility and make equipment recommendations for possibly sending pt home today. Pt with tremors and weakness sitting EOB, agreeable to gait training with RW. Pt ambulated to bathroom with light steadying assist, but on the way back to bed pt with decreased responsiveness, pallor. BP 77/51 and HR 128 bpm with incident, recovered to 138/72 and 109 bpm with return to supine HOB flat. PT feels pt is not ready for d/c at this time given symptomatic orthostatic hypotension, RN and trauma PA notified via secure chat.        If plan is discharge home, recommend the following: A little help with walking and/or transfers   Can travel by private vehicle        Equipment Recommendations  None recommended by PT    Recommendations for Other Services       Precautions / Restrictions Precautions Precautions: Other (comment);Fall Precaution Comments: abdominal binder Restrictions Weight Bearing Restrictions: No     Mobility  Bed Mobility Overal bed mobility: Needs Assistance Bed Mobility: Sit to Supine Rolling: Supervision   Supine to sit: Min assist Sit to supine: Mod assist, HOB elevated   General bed mobility comments: assist for trunk lower and LE progression into bed, pt sitting EOB upon PT arrival to room    Transfers Overall transfer level: Needs assistance Equipment used: Rolling walker (2 wheels) Transfers: Sit to/from Stand Sit to Stand: Contact guard assist           General transfer comment: close guard for safety, stand x2 from EOB and toilet     Ambulation/Gait Ambulation/Gait assistance: Min assist, Mod assist Gait Distance (Feet): 15 Feet (x2) Assistive device: Rolling walker (2 wheels) Gait Pattern/deviations: Step-through pattern, Decreased stride length, Trunk flexed Gait velocity: decr     General Gait Details: assist to steady and guide RW, increasing physical assist when pt becomes less responsive, PT guiding pt hips backwards and onto EOB to prevent fall. BP 77/51 and HR 128 bpm with incident, recovered to 138/72 and 109 bpm with return to supine HOB flat.   Stairs             Wheelchair Mobility     Tilt Bed    Modified Rankin (Stroke Patients Only)       Balance Overall balance assessment: Needs assistance Sitting-balance support: Feet unsupported, Bilateral upper extremity supported, No upper extremity supported Sitting balance-Leahy Scale: Good Sitting balance - Comments: EOB unsupported   Standing balance support: Single extremity supported, During functional activity Standing balance-Leahy Scale: Fair                              Cognition Arousal: Alert Behavior During Therapy: WFL for tasks assessed/performed Overall Cognitive Status: Within Functional Limits for tasks assessed                                 General Comments: limited by pain        Exercises      General  Comments General comments (skin integrity, edema, etc.): BP and HR: standing 119/71(89), 96 bpm; return to supine 114/63(77), 96 bpm      Pertinent Vitals/Pain Pain Assessment Pain Assessment: Faces Faces Pain Scale: Hurts even more Pain Location: R flank Pain Descriptors / Indicators: Discomfort, Grimacing, Guarding Pain Intervention(s): Limited activity within patient's tolerance, Monitored during session, Repositioned    Home Living                          Prior Function            PT Goals (current goals can now be found in the care plan section) Acute Rehab  PT Goals Patient Stated Goal: return home without pain PT Goal Formulation: With patient Time For Goal Achievement: 07/16/23 Potential to Achieve Goals: Good Progress towards PT goals: Not progressing toward goals - comment (symptomatic orthostasis)    Frequency    Min 1X/week      PT Plan      Co-evaluation              AM-PAC PT "6 Clicks" Mobility   Outcome Measure  Help needed turning from your back to your side while in a flat bed without using bedrails?: A Little Help needed moving from lying on your back to sitting on the side of a flat bed without using bedrails?: A Little Help needed moving to and from a bed to a chair (including a wheelchair)?: A Little Help needed standing up from a chair using your arms (e.g., wheelchair or bedside chair)?: A Little Help needed to walk in hospital room?: A Lot Help needed climbing 3-5 steps with a railing? : Total 6 Click Score: 15    End of Session Equipment Utilized During Treatment: Other (comment) (abd binder) Activity Tolerance: Patient tolerated treatment well Patient left: in bed;with call bell/phone within reach;with bed alarm set;with family/visitor present Nurse Communication: Mobility status PT Visit Diagnosis: Unsteadiness on feet (R26.81);Other abnormalities of gait and mobility (R26.89);History of falling (Z91.81);Difficulty in walking, not elsewhere classified (R26.2)     Time: 1440-1455 PT Time Calculation (min) (ACUTE ONLY): 15 min  Charges:    $Gait Training: 8-22 mins $Therapeutic Activity: 8-22 mins PT General Charges $$ ACUTE PT VISIT: 1 Visit                     Marye Round, PT DPT Acute Rehabilitation Services Secure Chat Preferred  Office [phone]    Nellie Chevalier E Christain [place] 07/05/2023, 3:05 PM

## 2023-07-05 NOTE — TOC Transition Note (Signed)
Transition of Care [facility]) - CM/SW Discharge Note   Patient Details  Name: Justin Patel MRN: [id number] Date of Birth: 01/22/1951  Transition of Care [facility]) CM/SW Contact:  Glennon Mac, RN Phone Number: 07/05/2023, 12:30   Clinical Narrative:    Per PA, patient now agreeable to [facility] services and RW.  Spoke with patient, and he confirms this; he has no preference for [facility] agency.  Referral to [facility] for continued therapies; referral to [employer] for RW, to be delivered to room prior to dc.  Patient states wife will be with him 24/7 at discharge.    Final next level of care: Home w Home Health Services Barriers to Discharge: Barriers Resolved   Patient Goals and CMS Choice CMS Medicare.gov Compare Post Acute Care list provided to:: Patient Choice offered to / list presented to : Patient                        Discharge Plan and Services Additional resources added to the After Visit Summary for     Discharge Planning Services: CM Consult Post Acute Care Choice: Home Health          DME Arranged: Walker rolling DME Agency: [facility] Date DME Agency Contacted: 07/05/23 Time DME Agency Contacted: 1235 Representative spoke with at DME Agency: Zollie Beckers HH Arranged: PT, OT [facility] Agency: [facility] Date [facility] Agency Contacted: 07/05/23 Time HH Agency Contacted: 1234 Representative spoke with at [facility] Agency: Lorenza Chick  Social Determinants of Health (SDOH) Interventions SDOH Screenings   Food Insecurity: No Food Insecurity (07/02/2023)  Housing: Low Risk  (07/02/2023)  Transportation Needs: No Transportation Needs (07/02/2023)  Utilities: Not At Risk (07/02/2023)  Depression (PHQ2-9): Low Risk  (04/13/2023)  Financial Resource Strain: Low Risk  (07/06/2022)  Physical Activity: Unknown (06/22/2021)  Social Connections: Unknown (07/06/2022)  Stress: No Stress Concern Present (07/06/2022)  Tobacco Use: Low Risk  (07/02/2023)     Readmission Risk  Interventions     No data to display         Quintella Baton, RN, BSN  Trauma/Neuro ICU Case Manager [phone]

## 2023-07-05 NOTE — Progress Notes (Signed)
Notified by RN and PT that patient became orthostatic with BP in the 70s/50s upon getting up this afternoon. BP improved without intervention but no longer appropriate for DC home. Will give gentle bolus 500 cc NS and recheck CBC to ensure hgb still stable from yesterday.   Juliet Rude, [facility] Surgery 07/05/2023, 3:35 PM Please see Amion for pager number during day hours 7:00am-4:30pm

## 2023-07-05 NOTE — Consult Note (Addendum)
[facility] Medical Consultation  Justin Patel UXL:[id number] DOB: 15-Nov-1950 DOA: 07/02/2023 PCP: [name], MD   Requesting physician: Violeta Gelinas, MD  Date of consultation: 07/05/2023   Reason for consultation: Orthostatic hypotension/fall  Impression/Recommendations  Principal Problem:   Right flank hematoma Active Problems:   Type 2 diabetes mellitus (HCC)   Essential hypertension, benign   BPH (benign prostatic hyperplasia)   Chronic pain syndrome   CKD stage 3a, GFR 45-59 ml/min (HCC)   Chronic back pain   Tremor   Hyperlipidemia   Right flank hematoma, status post fall.   On trauma service.  Being treated conservatively.  Patient did not require any blood transfusion.  Patient however was hypotensive and required IV fluid bolus during hospitalization.  Seen by PT OT and plan for home health on discharge.  Orthostatic hypotension.   Question syncope leading to fall.   Blood pressure was 75/50 upon getting up this afternoon.  Patient received a 500 mL of normal saline bolus.  Would recommend orthostatic precautions including compression stockings in the lower extremities.  Educated the patient about it.  Continue IV fluid bolus.  Do orthostatic vitals every shift.  Reviewed 2D echocardiogram from 06/06/2023 showing LV ejection fraction of 60 to 65% with grade 1 diastolic dysfunction.  Will not repeat echocardiogram.  If remains orthostatic despite fluids might benefit from adding low-dose of midodrine.  Would recommend continuation of physical therapy.  Will monitor CBC for possible ongoing blood loss.  Add telemetry.  History of congestive heart failure.  On Lasix 20 mg daily.  Will hold next dose.  Continue Crestor.  Continue telemetry.  Appears to be compensated at this time.  Would be okay with continuing IV fluids for 1 more day.  Will add intake and output charting, daily weights.  Chronic back pain, multiple spinal surgeries.  On  narcotics,  Neurontin.  Counseled about cutting down on it if possible at home.  Chronic kidney disease stage IIIa. Creatinine from 2 days back was 1.7.  Check BMP stat today.  Diabetes mellitus type 2 Hemoglobin A1c 2 months back was 6.4.  On Tradjenta as outpatient.  Blood glucose levels have been reasonably  Hyperlipidemia.  On Zetia and Crestor as outpatient.  Will continue.  Hypertension Hold off with antihypertensives due to orthostatic hypotension.  Check orthostatic blood pressure every shift..  History of anemia.  Seen by oncology as outpatient.  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  Hemoglobin from yesterday at 9.2.  Check hemoglobin again today.  History of essential tremors.  On primidone daily.  History of BPH.  On Flomax we will continue.  Hypokalemia on 07/03/2023.  Will check BMP stat today.  Replenish if necessary.  TRH team will followup again tomorrow. Please contact me if I can be of assistance in the meanwhile. Thank you for this consultation.  Chief Complaint: Orthostatic hypotension.  HPI:  72 years old male with past medical history of anemia, depression, diabetes mellitus, hypertension, hyperlipidemia, CKD, congestive heart failure, history of laminectomy, chronic pain on daily narcotics history of left total knee arthroplasty in the past  was admitted by trauma team for large right flank hematoma status post fall.  Patient did have a fall on 11/23 and hit his back while voiding.  In the ED, patient was noted to have right flank hematoma and was admitted to trauma service for pain control and further monitoring.  Patient was seen by physical therapy and outpatient therapy.  He was able  to void well and was ambulating well but then had low blood pressure while standing on 07/05/2023.  Medical team was then consulted for possible orthostatic hypotension versus syncope workup since he had similar episodes in the past.    Patient states that he does have multiple spinal  surgeries in the past and has chronic pain.  He did have episodes of syncope in the past especially on standing up.  Usually notices dizziness and vertigo-like feeling when standing up.  Patient denies any nausea, vomiting, abdominal pain.  Denies any urinary urgency, frequency or dysuria.  Denies any headache, loss of consciousness or blurry vision.  Denies any fever, chills or rigor.   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  removal scheduled to clear margins.   CHF (congestive heart failure) (HCC)    no longer after weight loss   Chronic kidney disease    cysts   Degenerative disc disease    Depression    Diabetes mellitus    no longer diabetic-or on meds   DJD (degenerative joint disease)    Dysplastic nevus 02/04/2016   R upper back paraspinal - severe   Family history of adverse reaction to anesthesia    sons wake up combative   Foot fracture, left 01/2022   Gastric ulcer    GERD (gastroesophageal reflux disease)    H/O hiatal hernia    Headache(784.0)    sinus   Heart murmur    History of fusion of thoracic spine 12/28/2021   History of gastric bypass    Hx of congestive heart failure    Hyperlipidemia    Hypertension    Iron deficiency anemia 02/28/2015   Lumbar scoliosis    Opiate abuse, continuous (HCC) 06/20/2015   S/P laparoscopic cholecystectomy 02/18/2020   Sacral fracture, closed (HCC)    Seizures (HCC)    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, after GI bleed   Sleep apnea    hx not now since wt loss   Stones in the urinary tract    Syncope and collapse    Past Surgical History:  Procedure Laterality Date   ANTERIOR CERVICAL DECOMP/DISCECTOMY FUSION  01/26/2012   Procedure: ANTERIOR CERVICAL  DECOMPRESSION/DISCECTOMY FUSION 3 LEVELS;  Surgeon: [name], MD;  Location: [facility];  Service: Neurosurgery;  Laterality: N/A;  Cervical three-four Cervical four-five Cervical five-six Cervical six-seven , Anterior cervical decompression/diskectomy, fusion, plate   ANTERIOR LAT LUMBAR FUSION Right 04/25/2018   Procedure: Right Lumbar two-three Lumbar three-four Lumbar four-five anterior  lateral interbody fusion  with posterior percutaneous pedicle screws;  Surgeon: [name], MD;  Location: [facility] OR;  Service: Neurosurgery;  Laterality: Right;   APPENDECTOMY     CARDIAC CATHETERIZATION     [facility], normal   CARDIAC CATHETERIZATION     [place]   CHOLECYSTECTOMY     COLONOSCOPY WITH PROPOFOL N/A 12/27/2017   Procedure: COLONOSCOPY WITH PROPOFOL;  Surgeon: Toledo, [name], MD;  Location: [facility];  Service: Gastroenterology;  Laterality: N/A;   ESOPHAGOGASTRODUODENOSCOPY (EGD) WITH PROPOFOL N/A 12/27/2017   Procedure: ESOPHAGOGASTRODUODENOSCOPY (EGD) WITH PROPOFOL;  Surgeon: Toledo, [name], MD;  Location: [facility];  Service: Gastroenterology;  Laterality:  N/A;   ESOPHAGOGASTRODUODENOSCOPY (EGD) WITH PROPOFOL N/A 06/19/2021   Procedure: ESOPHAGOGASTRODUODENOSCOPY (EGD) WITH PROPOFOL;  Surgeon: [name], MD;  Location: [facility];  Service: Gastroenterology;  Laterality: N/A;   ESOPHAGOGASTRODUODENOSCOPY (EGD) WITH PROPOFOL N/A 09/25/2021   Procedure: ESOPHAGOGASTRODUODENOSCOPY (EGD) WITH PROPOFOL;  Surgeon: [name], MD;  Location: [facility];  Service: Endoscopy;  Laterality: N/A;   GASTRIC BYPASS  08/09/2008   [employer] [facility]   HARDWARE REMOVAL N/A 03/08/2022   Procedure: Removal of bilateral Thoracic ten pedicle screws;  Surgeon: [name], MD;  Location[facility];  Service: Neurosurgery;  Laterality: N/A;   HERNIA REPAIR  08/09/1998   hiatal   JOINT REPLACEMENT     KNEE ARTHROSCOPY     bilateral, left x 2    LAMINECTOMY WITH POSTERIOR LATERAL ARTHRODESIS LEVEL 4 N/A 01/14/2020   Procedure: Decompressive Laminectomy Lumbar One with pedicle screw fixation from Thoracic Ten to Lumbar Two;  Surgeon: [name], MD;  Location: [facility] OR;  Service: Neurosurgery;  Laterality: N/A;  Decompressive Laminectomy Lumbar One with pedicle screw fixation from Thoracic Ten to Lumbar Two   LUMBAR PERCUTANEOUS PEDICLE SCREW 3 LEVEL N/A 04/25/2018   Procedure: LUMBAR PERCUTANEOUS PEDICLE SCREW 3 LEVEL;  Surgeon: [name], MD;  Location: [facility] OR;  Service: Neurosurgery;  Laterality: N/A;   NASAL SINUS SURGERY     x5   OSTEOTOMY  08/10/1999   left   ROUX-EN-Y GASTRIC BYPASS     THORACIC LAMINECTOMY FOR EPIDURAL ABSCESS N/A 03/28/2022   Procedure: THORACIC WOUND WASHOUT;  Surgeon: [name], MD;  Location: [facility] OR;  Service: Neurosurgery;  Laterality: N/A;   TONSILLECTOMY     TOTAL KNEE ARTHROPLASTY Left 05/09/2014   Dr. [name]   UVULOPALATOPLASTY  08/09/2009   Social History:  reports that he has never smoked. He has never used smokeless tobacco. He reports current alcohol use of about 21.0 standard drinks of alcohol per week. He reports that he does not use drugs.  Allergies  Allergen Reactions   Altace [Ramipril] Anaphylaxis   Ace Inhibitors Hives   Levaquin [Levofloxacin In D5w] Hives   Lyrica [Pregabalin] Other (See Comments)    Edema   Metformin Diarrhea    High doses cause diarrhea.    Trulicity [Dulaglutide] Nausea And Vomiting   Family History  Problem Relation Age of Onset   Dementia Mother 24   Osteoporosis Mother    Heart disease Father 55   Diabetes Father    Lymphoma Sister        lymphoma, stage 4   Ovarian cancer Sister 41       Ovarian   Lupus Sister    Prostate cancer Maternal Uncle    Skin cancer Maternal Uncle    Tongue cancer Maternal Uncle        uncle died of heart attack   Alcoholism Maternal Uncle    Lung cancer Paternal Aunt        pat aunts x 4 died of lung cancer    Lung cancer Paternal Aunt    Lung cancer Paternal Aunt    Lung cancer Paternal Aunt    Mesothelioma Cousin        paternal cousin    Prior to Admission medications   Medication Sig Start Date End Date Taking? Authorizing Provider  acetaminophen (TYLENOL) 325 MG tablet Take 650 mg by mouth every 6 (six) hours as needed for moderate pain.   Yes [provider]  amLODIPine-Valsartan-HCTZ 5-160-25 MG TABS Take 1  tablet by mouth daily.   Yes [provider]  Calcium Carbonate-Vitamin D (CALCIUM 600/VITAMIN D PO) Take 1 tablet by mouth daily.   Yes [provider]  Cholecalciferol 50 MCG (2000 UT) TABS Take 2,000 Units by mouth daily. 01/31/20  Yes [provider]  esomeprazole (NEXIUM) 40 MG capsule Take 1 capsule (40 mg total) by mouth 2 (two) times daily before a meal. 05/02/23  Yes [name], MD  ezetimibe (ZETIA) 10 MG tablet Take 1 tablet (10 mg total) by mouth daily. 01/11/23  Yes [name], MD  furosemide (LASIX) 20 MG tablet Take 20 mg by mouth daily. 06/06/23  Yes [provider]  HYDROcodone-acetaminophen (NORCO) 7.5-325 MG tablet Take 1 tablet by mouth 4 (four) times daily as needed for moderate pain (pain score 4-6).   Yes [provider]  linagliptin (TRADJENTA) 5 MG TABS tablet Take 1 tablet (5 mg total) by mouth daily. 02/01/23  Yes [name], MD  metoprolol succinate (TOPROL XL) 25 MG 24 hr tablet Take 1 tablet (25 mg total) by mouth daily. 05/25/23  Yes Furth, Cadence H, PA-C  Multiple Vitamin (MULTIVITAMIN) capsule Take 1 capsule by mouth daily. With copper and zinc Patient taking differently: Take 1 capsule by mouth daily. 07/11/18  Yes [name], MD  ondansetron (ZOFRAN-ODT) 4 MG disintegrating tablet Allow 1-2 tablets to dissolve in your mouth every 8 hours as needed for nausea/vomiting 02/08/23  Yes Loleta Rose, MD  primidone (MYSOLINE) 50 MG tablet Take 1 tablet (50 mg total) by mouth at  bedtime. 12/08/22  Yes Jaffe, Adam R, DO  rosuvastatin (CRESTOR) 40 MG tablet Take 1 tablet (40 mg total) by mouth daily. 02/01/23  Yes [name], MD  sucralfate (CARAFATE) 1 g tablet TAKE 1 TABLET BY MOUTH 4 TIMES DAILY 04/13/23  Yes [name], MD  tamsulosin (FLOMAX) 0.4 MG CAPS capsule Take 2 capsules (0.8 mg total) by mouth daily. Patient taking differently: Take 0.4 mg by mouth in the morning and at bedtime. 11/10/22  Yes [name], MD  venlafaxine XR (EFFEXOR-XR) 75 MG 24 hr capsule TAKE ONE CAPSULE EVERY MORNING WITH BREAKFAST 06/28/23  Yes [name], MD  docusate sodium (COLACE) 100 MG capsule Take 1 capsule (100 mg total) by mouth daily as needed for mild constipation. 07/05/23   Juliet Rude, PA-C  gabapentin (NEURONTIN) 300 MG capsule Take 1 capsule (300 mg total) by mouth 2 (two) times daily AND 2 capsules (600 mg total) daily with lunch. 07/05/23 08/04/23  Juliet Rude, PA-C  HYDROcodone-acetaminophen (NORCO) 7.5-325 MG tablet Take 1-2 tablets by mouth every 4 (four) hours as needed for moderate pain (pain score 4-6) or severe pain (pain score 7-10). 07/05/23   Juliet Rude, PA-C  methocarbamol 1000 MG TABS Take 1,000 mg by mouth 4 (four) times daily. 07/05/23   Juliet Rude, PA-C    Physical Exam: Blood pressure 125/69, pulse 90, temperature 98.3 F (36.8 C), temperature source Oral, resp. rate 18, height 5\' 5"  (1.651 m), weight 77.1 kg, SpO2 100%. Vitals:   07/05/23 0823 07/05/23 1529  BP: 132/81 125/69  Pulse: 80 90  Resp: 19 18  Temp: 98.1 F (36.7 C) 98.3 F (36.8 C)  SpO2: 100% 100%   Body mass index is 28.29 kg/m.   Intake/Output Summary (Last 24 hours) at 07/05/2023 1633 Last data filed at 07/05/2023 1300 Gross per 24 hour  Intake 520 ml  Output 700 ml  Net -180 ml    Body mass index is 28.29 kg/m.   General:  Average built, not in obvious distress HENT: Normocephalic, pupils equally reacting to light and  accommodation.  Mild scleral pallor noted.  Oral mucosa is moist.  Chest:  Clear breath sounds.   No crackles or wheezes.  CVS: S1 &S2 heard. No murmur.  Regular rate and rhythm. Abdomen: Soft, nontender, nondistended.  Bowel sounds are heard.   Extensive ecchymosis present over the right flank. Extremities: No cyanosis, clubbing or edema.  Peripheral pulses are palpable.  Tenderness on palpation of the bilateral lower extremities. Psych: Alert, awake and oriented, normal mood CNS:  No cranial nerve deficits.  Power equal in all extremities.  No sensory deficits noted.  Skin: Warm and dry.  Ecchymoses right flank.  Laboratory Data:  CBC: Recent Labs  Lab 07/02/23 0219 07/02/23 0232 07/02/23 0407 07/03/23 0631 07/03/23 1552 07/04/23 0700  WBC 12.1*  --  17.6* 9.8  --   --   NEUTROABS  --   --  15.1*  --   --   --   HGB 12.2* 12.6* 11.6* 9.7* 8.7* 9.2*  HCT 37.6* 37.0* 34.0* 29.3* 27.0* 28.9*  MCV 107.7*  --  106.6* 106.9*  --   --   PLT 156  --  149* 100*  --   --     Basic Metabolic Panel: Recent Labs  Lab 07/02/23 0219 07/02/23 0232 07/03/23 0631  NA 133* 134* 133*  K 3.5 3.5 3.0*  CL 100 100 98  CO2 18*  --  24  GLUCOSE 185* 176* 128*  BUN 9 9 18   CREATININE 1.71* 1.90* 1.78*  CALCIUM 8.7*  --  7.9*    Liver Function Tests: Recent Labs  Lab 07/02/23 0219  AST 68*  ALT 81*  ALKPHOS 44  BILITOT 0.6  PROT 5.3*  ALBUMIN 2.9*   No results for input(s): "LIPASE", "AMYLASE" in the last 168 hours. No results for input(s): "AMMONIA" in the last 168 hours.  Cardiac Enzymes: No results for input(s): "CKTOTAL", "CKMB", "CKMBINDEX", "TROPONINI" in the last 168 hours.  BNP: Invalid input(s): "POCBNP"  CBG: No results for input(s): "GLUCAP" in the last 168 hours.  Radiological Exams on Admission: No results found.  EKG: Independently reviewed from 07/02/2023 with normal sinus rhythm.  Time spent: 65 mins  Nylani Michetti [facility] 07/05/2023

## 2023-07-05 NOTE — Progress Notes (Signed)
Physical Therapy Treatment Patient Details Name: Justin Patel MRN: [id number] DOB: 22-Jun-1951 Today's Date: 07/05/2023   History of Present Illness 72 yo male presented to ED 11/23 after fall hitting back with Rt flank hematoma. PMhx: ACDF, anemia, skin CA, depression, DM, HTN, HLD, CKD, CHF, laminectomy, Lt TKA    PT Comments  Pt reporting significant R flank pain, PT assisted pt in donning abdominal binder for compression and stability. Pt with difficulty progressing beyond standing EOB given pain and lightheadedness, requests return to supine. PA notified of lack of progress with mobility, PT to come back later today to progress and possibly d/c. Will check back.     If plan is discharge home, recommend the following: Other (comment);A little help with walking and/or transfers   Can travel by private vehicle        Equipment Recommendations  None recommended by PT    Recommendations for Other Services       Precautions / Restrictions Precautions Precautions: Other (comment);Fall Precaution Comments: abdominal binder Restrictions Weight Bearing Restrictions: No     Mobility  Bed Mobility Overal bed mobility: Needs Assistance Bed Mobility: Rolling, Supine to Sit, Sit to Supine Rolling: Supervision   Supine to sit: Min assist Sit to supine: Min assist   General bed mobility comments: assist for trunk elevation/lowering, pt able to lift LEs back into bed    Transfers Overall transfer level: Needs assistance Equipment used: 1 person hand held assist Transfers: Sit to/from Stand Sit to Stand: Contact guard assist           General transfer comment: x2 stand at bedside, unable to progress given lightheadedness    Ambulation/Gait                   Stairs             Wheelchair Mobility     Tilt Bed    Modified Rankin (Stroke Patients Only)       Balance Overall balance assessment: Needs assistance Sitting-balance support: Feet  unsupported, Bilateral upper extremity supported, No upper extremity supported Sitting balance-Leahy Scale: Good Sitting balance - Comments: EOB unsupported   Standing balance support: Single extremity supported, During functional activity Standing balance-Leahy Scale: Fair                              Cognition Arousal: Alert Behavior During Therapy: WFL for tasks assessed/performed Overall Cognitive Status: Within Functional Limits for tasks assessed                                 General Comments: limited by pain        Exercises      General Comments General comments (skin integrity, edema, etc.): BP and HR: standing 119/71(89), 96 bpm; return to supine 114/63(77), 96 bpm      Pertinent Vitals/Pain Pain Assessment Pain Assessment: Faces Faces Pain Scale: Hurts even more Pain Location: R flank Pain Descriptors / Indicators: Discomfort, Grimacing, Guarding Pain Intervention(s): Limited activity within patient's tolerance, Monitored during session, Repositioned    Home Living                          Prior Function            PT Goals (current goals can now be found in the care plan section) Acute  Rehab PT Goals Patient Stated Goal: return home without pain PT Goal Formulation: With patient Time For Goal Achievement: 07/16/23 Potential to Achieve Goals: Good Progress towards PT goals: Progressing toward goals    Frequency    Min 1X/week      PT Plan      Co-evaluation              AM-PAC PT "6 Clicks" Mobility   Outcome Measure  Help needed turning from your back to your side while in a flat bed without using bedrails?: A Little Help needed moving from lying on your back to sitting on the side of a flat bed without using bedrails?: A Little Help needed moving to and from a bed to a chair (including a wheelchair)?: A Little Help needed standing up from a chair using your arms (e.g., wheelchair or bedside  chair)?: A Little Help needed to walk in hospital room?: Total Help needed climbing 3-5 steps with a railing? : Total 6 Click Score: 14    End of Session Equipment Utilized During Treatment: Other (comment) (abd binder) Activity Tolerance: Patient tolerated treatment well Patient left: in bed;with call bell/phone within reach;with bed alarm set Nurse Communication: Mobility status PT Visit Diagnosis: Unsteadiness on feet (R26.81);Other abnormalities of gait and mobility (R26.89);History of falling (Z91.81);Difficulty in walking, not elsewhere classified (R26.2)     Time: 8119-1478 PT Time Calculation (min) (ACUTE ONLY): 23 min  Charges:    $Therapeutic Activity: 8-22 mins PT General Charges $$ ACUTE PT VISIT: 1 Visit                     Marye Round, PT DPT Acute Rehabilitation Services Secure Chat Preferred  Office [phone]    Salley Boxley E Stroup 07/05/2023, 1:19 PM

## 2023-07-06 ENCOUNTER — Other Ambulatory Visit: Payer: Self-pay | Admitting: Family Medicine

## 2023-07-06 DIAGNOSIS — S301XXA Contusion of abdominal wall, initial encounter: Secondary | ICD-10-CM

## 2023-07-06 DIAGNOSIS — I10 Essential (primary) hypertension: Secondary | ICD-10-CM

## 2023-07-06 LAB — CBC
HCT: 23.2 % — ABNORMAL LOW (ref 39.0–52.0)
Hemoglobin: 7.6 g/dL — ABNORMAL LOW (ref 13.0–17.0)
MCH: 35.7 pg — ABNORMAL HIGH (ref 26.0–34.0)
MCHC: 32.8 g/dL (ref 30.0–36.0)
MCV: 108.9 fL — ABNORMAL HIGH (ref 80.0–100.0)
Platelets: 92 10*3/uL — ABNORMAL LOW (ref 150–400)
RBC: 2.13 MIL/uL — ABNORMAL LOW (ref 4.22–5.81)
RDW: 14.5 % (ref 11.5–15.5)
WBC: 7.8 10*3/uL (ref 4.0–10.5)
nRBC: 0 % (ref 0.0–0.2)

## 2023-07-06 LAB — BASIC METABOLIC PANEL
Anion gap: 6 (ref 5–15)
BUN: 16 mg/dL (ref 8–23)
CO2: 24 mmol/L (ref 22–32)
Calcium: 8.8 mg/dL — ABNORMAL LOW (ref 8.9–10.3)
Chloride: 105 mmol/L (ref 98–111)
Creatinine, Ser: 1.42 mg/dL — ABNORMAL HIGH (ref 0.61–1.24)
GFR, Estimated: 53 mL/min — ABNORMAL LOW (ref >60)
Glucose, Bld: 124 mg/dL — ABNORMAL HIGH (ref 70–99)
Potassium: 4.4 mmol/L (ref 3.5–5.1)
Sodium: 135 mmol/L (ref 135–145)

## 2023-07-06 LAB — HEMOGLOBIN AND HEMATOCRIT, BLOOD
HCT: 25.5 % — ABNORMAL LOW (ref 39.0–52.0)
Hemoglobin: 8.1 g/dL — ABNORMAL LOW (ref 13.0–17.0)

## 2023-07-06 LAB — MAGNESIUM: Magnesium: 1.8 mg/dL (ref 1.7–2.4)

## 2023-07-06 LAB — PREPARE RBC (CROSSMATCH)

## 2023-07-06 MED ORDER — CALCIUM GLUCONATE-NACL 2-0.675 GM/100ML-% IV SOLN
2.0000 g | Freq: Once | INTRAVENOUS | Status: AC
  Administered 2023-07-06: 2000 mg via INTRAVENOUS
  Filled 2023-07-06: qty 100

## 2023-07-06 MED ORDER — SODIUM CHLORIDE 0.9% IV SOLUTION: Freq: Once | INTRAVENOUS | Status: AC

## 2023-07-06 NOTE — Care Management Important Message (Signed)
Important Message  Patient Details  Name: Justin Patel MRN: [id number] Date of Birth: [birth year]   Important Message Given:  Yes - Medicare IM     Sherilyn Banker 07/06/2023, 1:58 PM

## 2023-07-06 NOTE — Telephone Encounter (Signed)
Medication was discontinued on 05/11/2023. Is it okay to refuse request?

## 2023-07-06 NOTE — Progress Notes (Addendum)
PROGRESS NOTE  Justin Patel  ZOX:[id number] DOB: 1951/02/15 DOA: 07/02/2023 PCP: [name], MD   Brief Narrative: Patient is a 72 year old male with history of anemia, depression, diabetes type 2, hypertension, hyperlipidemia, CKD stage IIIa, CHF, chronic pain syndrome, left total knee arthroplasty who was admitted by trauma surgery for the management of large right flank hematoma after the fall.  Patient was admitted under trauma service.  We are consulted for the evaluation of orthostatic hypotension, blood pressure dropped into the range of 70s.  Currently hemodynamically stable.  Last orthostatic vitals were negative.  PT/OT recommending home health on discharge  Assessment & Plan:  Principal Problem:   Right flank hematoma Active Problems:   Type 2 diabetes mellitus (HCC)   Essential hypertension, benign   BPH (benign prostatic hyperplasia)   Chronic pain syndrome   CKD stage 3a, GFR 45-59 ml/min (HCC)   Chronic back pain   Tremor   Hyperlipidemia   Orthostatic hypotension: Blood pressure fell with systolic in the range of 70s on 11/26.  Given 500 mL normal saline bolus.  Recommend to continue compression stockings. Recent 2D  echo done on 10/28 showed EF of 60-65% and grade 1 diastolic dysfunction.  Repeat orthostatic vitals checked on 11/27 at 8: 16 p.m. have been negative.  Blood pressure better this morning.  Will not restart on midodrine unless systolic blood pressure less than 120 mmHg.   Given his anemia, he might benefit with a unit of blood transfusion.  It will also help with his orthostatic hypotension.  No further recommendation.  Patient needs to be careful on ambulation, avoid getting abruptly from sitting or lying position.  Right flank hematoma/fall: Management as per trauma service.  PT recommending home health.  History of diastolic CHF: Last echo showed normal ejection fraction.  Currently appears euvolemic.  Can discontinue Lasix for now given  orthostatic hypotension.  Can restart as an outpatient  Chronic back pain: On narcotics, Neurontin.  Minimize narcotics as much as possible given orthostatic hypotension.  CKD stage IIIa: Currently kidney function at baseline.  Diabetes type 2: Recent A1c of 6.4.  On Tradjenta  Hyperlipidemia: On Zetia, Crestor  History of hypertension: Antihypertensive should be held due to orthostatic hypotension.  Macrocytic anemia: Follows with oncology as an outpatient.  History of Roux-en-Y jejunostomy.  His baseline hemoglobin is around 11-12.  Hemoglobin currently in the range of 7, likely associated with hematoma.  Check CBC in a week.  Recommend a unit of blood transfusion today  History of tremor: Primidone  History of BPH: On Flomax  Hypokalemia: Supplemented  and corrected.       DVT prophylaxis:Place and maintain sequential compression device Start: 07/05/23 1705 Place TED hose Start: 07/02/23 1014 SCDs Start: 07/02/23 1012     Code Status: Full Code   Antimicrobials:  Anti-infectives (From admission, onward)    None       Subjective:  Patient seen and examined bedside today.  Hemodynamically stable.  Sitting at the edge of the bed. Brusing his teeth. Complains of right flank discomfort but not in significant distress.  Blood pressure stable this morning but still complains of some weakness, shakiness.  Objective: Vitals:   07/05/23 2016 07/06/23 0414 07/06/23 0423 07/06/23 0733  BP: 114/71 122/75  121/75  Pulse: 83 79  65  Resp: 16 18  16   Temp: 98.1 F (36.7 C) (!) 97.5 F (36.4 C)  97.7 F (36.5 C)  TempSrc: Oral Oral  Oral  SpO2:  100% 100%  100%  Weight:   80.9 kg   Height:        Intake/Output Summary (Last 24 hours) at 07/06/2023 1030 Last data filed at 07/06/2023 0415 Gross per 24 hour  Intake 880 ml  Output 800 ml  Net 80 ml   Filed Weights   07/02/23 0219 07/06/23 0423  Weight: 77.1 kg 80.9 kg    Examination:  General exam: Overall  comfortable, not in distress HEENT: PERRL Respiratory system:  no wheezes or crackles  Cardiovascular system: S1 & S2 heard, RRR.  Gastrointestinal system: Abdomen is nondistended, soft and nontender. Central nervous system: Alert and oriented Extremities: No edema, no clubbing ,no cyanosis Skin: Large area of ecchymosis/bruise on the right flank   Data Reviewed: I have personally reviewed following labs and imaging studies  CBC: Recent Labs  Lab 07/02/23 0219 07/02/23 0232 07/02/23 0407 07/03/23 0631 07/03/23 1552 07/04/23 0700 07/05/23 1642 07/06/23 0633  WBC 12.1*  --  17.6* 9.8  --   --  8.0 7.8  NEUTROABS  --   --  15.1*  --   --   --   --   --   HGB 12.2*   < > 11.6* 9.7* 8.7* 9.2* 7.9* 7.6*  HCT 37.6*   < > 34.0* 29.3* 27.0* 28.9* 24.9* 23.2*  MCV 107.7*  --  106.6* 106.9*  --   --  109.7* 108.9*  PLT 156  --  149* 100*  --   --  94* 92*   < > = values in this interval not displayed.   Basic Metabolic Panel: Recent Labs  Lab 07/02/23 0219 07/02/23 0232 07/03/23 0631 07/05/23 1646 07/06/23 0633  NA 133* 134* 133* 136 135  K 3.5 3.5 3.0* 2.9* 4.4  CL 100 100 98 102 105  CO2 18*  --  24 25 24   GLUCOSE 185* 176* 128* 113* 124*  BUN 9 9 18 16 16   CREATININE 1.71* 1.90* 1.78* 1.69* 1.42*  CALCIUM 8.7*  --  7.9* 8.9 8.8*  MG  --   --   --   --  1.8     No results found for this or any previous visit (from the past 240 hour(s)).   Radiology Studies: No results found.  Scheduled Meds:  cholecalciferol  2,000 Units Oral Daily   docusate sodium  100 mg Oral BID   ezetimibe  10 mg Oral Daily   gabapentin  300 mg Oral BID   gabapentin  600 mg Oral Q lunch   lidocaine  2 patch Transdermal Q24H   methocarbamol  1,000 mg Oral QID   multivitamin with minerals  1 tablet Oral Daily   pantoprazole  40 mg Oral Daily   primidone  50 mg Oral QHS   rosuvastatin  40 mg Oral Daily   sucralfate  1 g Oral QID   tamsulosin  0.4 mg Oral BID   venlafaxine XR  75 mg Oral  Q breakfast   Continuous Infusions:   LOS: 3 days   Burnadette Pop, MD [facility] P11/27/2024, 10:30 AM

## 2023-07-06 NOTE — Plan of Care (Signed)
Attempting to reinforce measures on MAR to control pain

## 2023-07-06 NOTE — Telephone Encounter (Signed)
Please contact the patient and see if he is still taking this.

## 2023-07-06 NOTE — Progress Notes (Signed)
Occupational Therapy Treatment Patient Details Name: Justin Patel MRN: [id number] DOB: 04-Jul-1951 Today's Date: 07/06/2023   History of present illness 72 yo male presented to ED 11/23 after fall hitting back with Rt flank hematoma. PMhx: ACDF, anemia, skin CA, depression, DM, HTN, HLD, CKD, CHF, laminectomy, Lt TKA   OT comments  Patient received in supine and agreeable to OT treatment. Patient's BP in supine 135/72 (92). Patient requiring CGA to get to EOB due to right flank pain. Patient performed grooming tasks seated on EOB with BP 139/84 (100) following. BLE compression stockings donned with max assist and patient performed mobility in room and hallway with BP 103/64 (72) before sitting back on EOB. Patient making good gains and would benefit from continued OT with HHOT to further address functional mobility, transfers, and self care. Acute OT to continue to follow.       If plan is discharge home, recommend the following:  A little help with walking and/or transfers;A little help with bathing/dressing/bathroom;Assistance with cooking/housework;Assist for transportation;Help with stairs or ramp for entrance   Equipment Recommendations  BSC/3in1    Recommendations for Other Services      Precautions / Restrictions Precautions Precautions: Other (comment);Fall Precaution Comments: abdominal binder, compression stockings Restrictions Weight Bearing Restrictions: No       Mobility Bed Mobility Overal bed mobility: Needs Assistance Bed Mobility: Supine to Sit, Sit to Supine     Supine to sit: Contact guard Sit to supine: Contact guard assist   General bed mobility comments: CGA with trunk    Transfers Overall transfer level: Needs assistance Equipment used: Rolling walker (2 wheels) Transfers: Sit to/from Stand Sit to Stand: Contact guard assist           General transfer comment: CGA to stand and for safety with mobility     Balance Overall balance  assessment: Needs assistance Sitting-balance support: Feet unsupported, Bilateral upper extremity supported, No upper extremity supported Sitting balance-Leahy Scale: Good Sitting balance - Comments: EOB unsupported   Standing balance support: Single extremity supported, During functional activity Standing balance-Leahy Scale: Fair Standing balance comment: patient states he feels more stable with RW                           ADL either performed or assessed with clinical judgement   ADL Overall ADL's : Needs assistance/impaired     Grooming: Wash/dry hands;Wash/dry face;Oral care;Set up;Sitting Grooming Details (indicate cue type and reason): on EOB             Lower Body Dressing: Maximal assistance;Sitting/lateral leans Lower Body Dressing Details (indicate cue type and reason): to donn compression stockings seated on EOB Toilet Transfer: Contact guard assist                  Extremity/Trunk Assessment              Vision       Perception     Praxis      Cognition Arousal: Alert Behavior During Therapy: WFL for tasks assessed/performed Overall Cognitive Status: Within Functional Limits for tasks assessed                                          Exercises      Shoulder Instructions       General Comments BP supine 135/72, seated on  EOB 139/84, with compression stockings after mobility 103/64    Pertinent Vitals/ Pain       Pain Assessment Pain Assessment: Faces Faces Pain Scale: Hurts little more Pain Location: R flank Pain Descriptors / Indicators: Discomfort, Grimacing, Guarding Pain Intervention(s): Limited activity within patient's tolerance, Monitored during session, Premedicated before session, Repositioned  Home Living                                          Prior Functioning/Environment              Frequency  Min 1X/week        Progress Toward Goals  OT Goals(current  goals can now be found in the care plan section)  Progress towards OT goals: Progressing toward goals  Acute Rehab OT Goals Patient Stated Goal: go home OT Goal Formulation: With patient Time For Goal Achievement: 07/16/23 Potential to Achieve Goals: Good ADL Goals Pt Will Perform Grooming: with contact guard assist;standing Pt Will Perform Lower Body Dressing: with contact guard assist;sit to/from stand Pt Will Transfer to Toilet: with contact guard assist;ambulating Additional ADL Goal #1: Pt will demonstrate use of compensatory techniques for pain management during mobility and ADL  Plan      Co-evaluation                 AM-PAC OT "6 Clicks" Daily Activity     Outcome Measure   Help from another person eating meals?: None Help from another person taking care of personal grooming?: A Little Help from another person toileting, which includes using toliet, bedpan, or urinal?: A Little Help from another person bathing (including washing, rinsing, drying)?: A Little Help from another person to put on and taking off regular upper body clothing?: A Little Help from another person to put on and taking off regular lower body clothing?: A Little 6 Click Score: 19    End of Session Equipment Utilized During Treatment: Gait belt;Rolling walker (2 wheels)  OT Visit Diagnosis: Unsteadiness on feet (R26.81);Muscle weakness (generalized) (M62.81);History of falling (Z91.81);Pain Pain - Right/Left: Right Pain - part of body:  (flank)   Activity Tolerance Patient tolerated treatment well   Patient Left in bed;with call bell/phone within reach;with bed alarm set   Nurse Communication Mobility status;Other (comment) (BP)        Time: 1610-9604 OT Time Calculation (min): 35 min  Charges: OT General Charges $OT Visit: 1 Visit OT Treatments $Self Care/Home Management : 8-22 mins $Therapeutic Activity: 8-22 mins  Alfonse Flavors, OTA Acute Rehabilitation Services  Office  [phone]   Justin Patel 07/06/2023, 1:28 PM

## 2023-07-06 NOTE — Progress Notes (Signed)
Progress Note     Subjective: Pt was planned for DC yesterday but became more orthostatic in the afternoon. Discharge cancelled and internal medicine consulted. He is still feeling a little dizzy this AM but orthostasis was improved overnight. He is tolerating diet and having bowel function. Denies abdominal pain, nausea or vomiting. He is taking in fluids. He is agreeable to try support hose today.   Objective: Vital signs in last 24 hours: Temp:  [97.5 F (36.4 C)-98.3 F (36.8 C)] 97.7 F (36.5 C) (11/27 0733) Pulse Rate:  [65-90] 65 (11/27 0733) Resp:  [16-18] 16 (11/27 0733) BP: (114-125)/(69-75) 121/75 (11/27 0733) SpO2:  [100 %] 100 % (11/27 0733) Weight:  [80.9 kg] 80.9 kg (11/27 0423) Last BM Date : 07/05/23  Intake/Output from previous day: 11/26 0701 - 11/27 0700 In: 880 [P.O.:880] Out: 800 [Urine:800] Intake/Output this shift: No intake/output data recorded.  PE: General: WD, chronically ill appearing male, NAD Heart: regular, rate, and rhythm.   Lungs: Respiratory effort nonlabored Abd: soft, ttp over right flank with stable ecchymosis of right flank, ND MS: all 4 extremities are symmetrical with no cyanosis, clubbing, or edema. Psych: A&Ox3 with an appropriate affect.    Lab Results:  Recent Labs    07/05/23 1642 07/06/23 0633  WBC 8.0 7.8  HGB 7.9* 7.6*  HCT 24.9* 23.2*  PLT 94* 92*   BMET Recent Labs    07/05/23 1646 07/06/23 0633  NA 136 135  K 2.9* 4.4  CL 102 105  CO2 25 24  GLUCOSE 113* 124*  BUN 16 16  CREATININE 1.69* 1.42*  CALCIUM 8.9 8.8*   PT/INR No results for input(s): "LABPROT", "INR" in the last 72 hours.  CMP     Component Value Date/Time   NA 135 07/06/2023 0633   NA 142 02/15/2019 0932   NA 140 05/17/2014 0529   K 4.4 07/06/2023 0633   K 3.8 05/17/2014 0529   CL 105 07/06/2023 0633   CL 105 05/17/2014 0529   CO2 24 07/06/2023 0633   CO2 27 05/17/2014 0529   GLUCOSE 124 (H) 07/06/2023 0633   GLUCOSE 112 (H)  05/17/2014 0529   BUN 16 07/06/2023 0633   BUN 29 (H) 02/15/2019 0932   BUN 10 05/17/2014 0529   CREATININE 1.42 (H) 07/06/2023 0633   CREATININE 1.18 01/26/2023 1449   CREATININE 1.10 04/20/2022 1035   CALCIUM 8.8 (L) 07/06/2023 0633   CALCIUM 8.2 (L) 05/17/2014 0529   PROT 5.3 (L) 07/02/2023 0219   PROT 6.5 03/14/2019 0000   PROT 4.5 (L) 07/22/2013 0410   ALBUMIN 2.9 (L) 07/02/2023 0219   ALBUMIN 4.1 03/14/2019 0000   ALBUMIN 2.3 (L) 07/22/2013 0410   AST 68 (H) 07/02/2023 0219   AST 40 10/04/2022 1401   ALT 81 (H) 07/02/2023 0219   ALT 31 10/04/2022 1401   ALT 12 07/22/2013 0410   ALKPHOS 44 07/02/2023 0219   ALKPHOS 44 (L) 07/22/2013 0410   BILITOT 0.6 07/02/2023 0219   BILITOT 0.4 10/04/2022 1401   GFRNONAA 53 (L) 07/06/2023 0633   GFRNONAA >60 01/26/2023 1449   GFRNONAA >60 05/17/2014 0529   GFRNONAA >60 05/01/2014 0928   GFRAA >60 01/30/2020 0129   GFRAA >60 05/17/2014 0529   GFRAA >60 05/01/2014 0928   Lipase     Component Value Date/Time   LIPASE 74 (H) 06/06/2021 1821   LIPASE 164 11/12/2011 1250       Studies/Results: No results found.  Anti-infectives: Anti-infectives (  From admission, onward)    None        Assessment/Plan  Fall from standing Right flank hematoma ABL anemia Orthostatic hypotension  - hgb 7.9 yesterday evening, 7.6 this AM. Continue to monitor and transfuse if hgb <7.0 - ice/binder/k pad prn to side - continue norco for pain control  - consulted internal medicine yesterday, appreciate assistance   - TED hose ordered - continue therapies and check orthostatics q shift  FEN: reg diet, SLIV VTE: on hold in setting of ABL anemia  ID: no abx indicated   Dispo: Continue therapies and monitor orthostatics  LOS: 3 days   I reviewed hospitalist notes, last 24 h vitals and pain scores, last 48 h intake and output, last 24 h labs and trends, and therapy notes .   Juliet Rude, [facility] Surgery 07/06/2023,  10:46 AM Please see Amion for pager number during day hours 7:00am-4:30pm

## 2023-07-07 DIAGNOSIS — S301XXA Contusion of abdominal wall, initial encounter: Secondary | ICD-10-CM | POA: Diagnosis not present

## 2023-07-07 LAB — BPAM RBC
Blood Product Expiration Date: 202412202359
ISSUE DATE / TIME: 202411271513
Unit Type and Rh: 6200

## 2023-07-07 LAB — TYPE AND SCREEN
ABO/RH(D): AB POS
Antibody Screen: NEGATIVE
Unit division: 0

## 2023-07-07 LAB — CBC
HCT: 24.9 % — ABNORMAL LOW (ref 39.0–52.0)
Hemoglobin: 8.1 g/dL — ABNORMAL LOW (ref 13.0–17.0)
MCH: 34.5 pg — ABNORMAL HIGH (ref 26.0–34.0)
MCHC: 32.5 g/dL (ref 30.0–36.0)
MCV: 106 fL — ABNORMAL HIGH (ref 80.0–100.0)
Platelets: 100 10*3/uL — ABNORMAL LOW (ref 150–400)
RBC: 2.35 MIL/uL — ABNORMAL LOW (ref 4.22–5.81)
RDW: 17.4 % — ABNORMAL HIGH (ref 11.5–15.5)
WBC: 6.7 10*3/uL (ref 4.0–10.5)
nRBC: 0 % (ref 0.0–0.2)

## 2023-07-07 MED ORDER — MIDODRINE HCL 5 MG PO TABS
5.0000 mg | ORAL_TABLET | Freq: Three times a day (TID) | ORAL | Status: DC
  Administered 2023-07-07 – 2023-07-09 (×8): 5 mg via ORAL
  Filled 2023-07-07 (×9): qty 1

## 2023-07-07 MED ORDER — SODIUM CHLORIDE 0.9 % IV SOLN: INTRAVENOUS | Status: AC

## 2023-07-07 NOTE — Plan of Care (Signed)
Educated concerning new medication midodrine

## 2023-07-07 NOTE — Progress Notes (Signed)
   Subjective/Chief Complaint: Patient complaining of dizziness with ambulation.  His vitals are reviewed.  He was supposed to go home but due to feeling dizzy today.  He still having some dizziness.  He is tolerating his diet and enjoys the food at the hospital he states.  Complains of some pain over his flank worse hematomas.   Objective: Vital signs in last 24 hours: Temp:  [97.6 F (36.4 C)-98.4 F (36.9 C)] 98.4 F (36.9 C) (11/28 0751) Pulse Rate:  [78-96] 87 (11/28 0751) Resp:  [16-18] 16 (11/28 0751) BP: (126-134)/(66-84) 127/78 (11/28 0751) SpO2:  [91 %-100 %] 91 % (11/28 0751) Weight:  [79.4 kg] 79.4 kg (11/28 0516) Last BM Date : 07/06/23  Intake/Output from previous day: 11/27 0701 - 11/28 0700 In: 648.3 [P.O.:240; Blood:308.3; IV Piggyback:100] Out: 300 [Urine:300] Intake/Output this shift: No intake/output data recorded.  General appearance: NAD  Pulmonary: Clear to auscultation  Cardiovascular: Normal sinus rhythm  Abdomen: Soft nontender.  Large right flank bruise noted  Lab Results:  Recent Labs    07/06/23 0633 07/06/23 2037 07/07/23 0538  WBC 7.8  --  6.7  HGB 7.6* 8.1* 8.1*  HCT 23.2* 25.5* 24.9*  PLT 92*  --  100*   BMET Recent Labs    07/05/23 1646 07/06/23 0633  NA 136 135  K 2.9* 4.4  CL 102 105  CO2 25 24  GLUCOSE 113* 124*  BUN 16 16  CREATININE 1.69* 1.42*  CALCIUM 8.9 8.8*   PT/INR No results for input(s): "LABPROT", "INR" in the last 72 hours. ABG No results for input(s): "PHART", "HCO3" in the last 72 hours.  Invalid input(s): "PCO2", "PO2"  Studies/Results: No results found.  Anti-infectives: Anti-infectives (From admission, onward)    None       Assessment/Plan:  Fall from standing Right flank hematoma ABL anemia Orthostatic hypotension  - hgb 8.1 today.  Still having some dizziness so hold on discharge and continue to work with therapies  Appreciate internal medicine input   - ice/binder/k pad  prn to side - continue norco for pain control  - consulted internal medicine yesterday, appreciate assistance   - TED hose ordered - continue therapies and check orthostatics q shift   FEN: reg diet, SLIV VTE: on hold in setting of ABL anemia  ID: no abx indicated    Dispo: Continue therapies and monitor orthostatics   LOS: 4 days    Justin Patel Justin Patel 07/07/2023 Straightforward decision making

## 2023-07-07 NOTE — Progress Notes (Signed)
PROGRESS NOTE  Justin Patel  ZOX:[id number] DOB: [birth year] DOA: 07/02/2023 PCP: [name], MD   Brief Narrative: Patient is a 72 year old male with history of anemia, depression, diabetes type 2, hypertension, hyperlipidemia, CKD stage IIIa, CHF, chronic pain syndrome, left total knee arthroplasty who was admitted by trauma surgery for the management of large right flank hematoma after the fall.  Patient was admitted under trauma service.  We are consulted for the evaluation of orthostatic hypotension, blood pressure dropped into the range of 70s.  PT/OT recommending home health on discharge.  Remains orthostatic this morning, started on aggressive IV fluid  Assessment & Plan:  Principal Problem:   Right flank hematoma Active Problems:   Type 2 diabetes mellitus (HCC)   Essential hypertension, benign   BPH (benign prostatic hyperplasia)   Chronic pain syndrome   CKD stage 3a, GFR 45-59 ml/min (HCC)   Chronic back pain   Tremor   Hyperlipidemia   Orthostatic hypotension: Blood pressure fell with systolic in the range of 70s on 11/26.  Given 500 mL normal saline bolus.  Recommend to continue compression stockings but he tool it out Continue TED hose. Recent 2D  echo done on 10/28 showed EF of 60-65% and grade 1 diastolic dysfunction.  Repeat orthostatic vitals checked on 11/27 at 8: 16 p.m. was  negative.  But orthostatic vitals checked this morning is positive again.  Started on IV fluid.  Resting blood pressure stable.  Started on midodrine as well.  Patient needs to be careful on ambulation, avoid getting abruptly from sitting or lying position. Patient complains of dizziness while getting up.  Right flank hematoma/fall: Management as per trauma service.  PT recommending home health.  History of diastolic CHF: Last echo showed normal ejection fraction.  Currently appears euvolemic.  Currently on IV fluids  Chronic back pain: On narcotics, Neurontin.  Minimize narcotics as  much as possible given orthostatic hypotension.  CKD stage IIIa: Currently kidney function at baseline.  Diabetes type 2: Recent A1c of 6.4.  On Tradjenta  Hyperlipidemia: On Zetia, Crestor  History of hypertension: Antihypertensive should be held due to orthostatic hypotension.  Macrocytic anemia: Follows with oncology as an outpatient.  History of Roux-en-Y jejunostomy.  His baseline hemoglobin is around 11-12.  Hemoglobin drop to the range of 7, likely associated with hematoma.  Given unit of blood transfusion on 11/27, hemoglobin today in the range of 8  History of tremor: Primidone  History of BPH: On Flomax.Will hold Flomax because of orthostatic hypotension  Hypokalemia: Supplemented  and corrected.       DVT prophylaxis:Place and maintain sequential compression device Start: 07/05/23 1705 Place TED hose Start: 07/02/23 1014 SCDs Start: 07/02/23 1012     Code Status: Full Code   Antimicrobials:  Anti-infectives (From admission, onward)    None       Subjective:  Patient seen and examined at bedside today.  Hemodynamically stable.  Blood pressure was stable while he was lying in bed.  Orthostatic vitals positive.  Complains of dizziness.  Objective: Vitals:   07/07/23 1000 07/07/23 1001 07/07/23 1003 07/07/23 1006  BP:      Pulse: 80 89 (!) 101 (!) 110  Resp:      Temp:      TempSrc:      SpO2:      Weight:      Height:        Intake/Output Summary (Last 24 hours) at 07/07/2023 1043 Last data filed at  07/06/2023 2255 Gross per 24 hour  Intake 648.33 ml  Output 200 ml  Net 448.33 ml   Filed Weights   07/02/23 0219 07/06/23 0423 07/07/23 0516  Weight: 77.1 kg 80.9 kg 79.4 kg    Examination:  General exam: Overall comfortable, not in distress HEENT: PERRL Respiratory system:  no wheezes or crackles  Cardiovascular system: S1 & S2 heard, RRR.  Gastrointestinal system: Abdomen is nondistended, soft and nontender. Central nervous system: Alert  and oriented Extremities: No edema, no clubbing ,no cyanosis Skin: Large area of ecchymosis/bruise on the right flank   Data Reviewed: I have personally reviewed following labs and imaging studies  CBC: Recent Labs  Lab 07/02/23 0407 07/03/23 0631 07/03/23 1552 07/04/23 0700 07/05/23 1642 07/06/23 0633 07/06/23 2037 07/07/23 0538  WBC 17.6* 9.8  --   --  8.0 7.8  --  6.7  NEUTROABS 15.1*  --   --   --   --   --   --   --   HGB 11.6* 9.7*   < > 9.2* 7.9* 7.6* 8.1* 8.1*  HCT 34.0* 29.3*   < > 28.9* 24.9* 23.2* 25.5* 24.9*  MCV 106.6* 106.9*  --   --  109.7* 108.9*  --  106.0*  PLT 149* 100*  --   --  94* 92*  --  100*   < > = values in this interval not displayed.   Basic Metabolic Panel: Recent Labs  Lab 07/02/23 0219 07/02/23 0232 07/03/23 0631 07/05/23 1646 07/06/23 0633  NA 133* 134* 133* 136 135  K 3.5 3.5 3.0* 2.9* 4.4  CL 100 100 98 102 105  CO2 18*  --  24 25 24   GLUCOSE 185* 176* 128* 113* 124*  BUN 9 9 18 16 16   CREATININE 1.71* 1.90* 1.78* 1.69* 1.42*  CALCIUM 8.7*  --  7.9* 8.9 8.8*  MG  --   --   --   --  1.8     No results found for this or any previous visit (from the past 240 hour(s)).   Radiology Studies: No results found.  Scheduled Meds:  cholecalciferol  2,000 Units Oral Daily   docusate sodium  100 mg Oral BID   ezetimibe  10 mg Oral Daily   gabapentin  300 mg Oral BID   gabapentin  600 mg Oral Q lunch   lidocaine  2 patch Transdermal Q24H   methocarbamol  1,000 mg Oral QID   midodrine  5 mg Oral TID WC   multivitamin with minerals  1 tablet Oral Daily   pantoprazole  40 mg Oral Daily   primidone  50 mg Oral QHS   rosuvastatin  40 mg Oral Daily   sucralfate  1 g Oral QID   venlafaxine XR  75 mg Oral Q breakfast   Continuous Infusions:  sodium chloride       LOS: 4 days   Burnadette Pop, MD [facility] P11/28/2024, 10:43 AM

## 2023-07-08 DIAGNOSIS — S301XXA Contusion of abdominal wall, initial encounter: Secondary | ICD-10-CM | POA: Diagnosis not present

## 2023-07-08 LAB — HEMOGLOBIN AND HEMATOCRIT, BLOOD
HCT: 23 % — ABNORMAL LOW (ref 39.0–52.0)
Hemoglobin: 7.4 g/dL — ABNORMAL LOW (ref 13.0–17.0)

## 2023-07-08 LAB — GLUCOSE, CAPILLARY
Glucose-Capillary: 103 mg/dL — ABNORMAL HIGH (ref 70–99)
Glucose-Capillary: 104 mg/dL — ABNORMAL HIGH (ref 70–99)
Glucose-Capillary: 117 mg/dL — ABNORMAL HIGH (ref 70–99)

## 2023-07-08 NOTE — TOC Progression Note (Signed)
Transition of Care [facility]) - Progression Note    Patient Details  Name: Justin Patel MRN: [id number] Date of Birth: 09-22-1950  Transition of Care [facility]) CM/SW Contact  Glennon Mac, RN Phone Number: 07/08/2023, 3:57 PM  Clinical Narrative:    Met with patient to discuss progress; he states he is feeling better, but still slightly dizzy with standing.  Patient has RW in room; [facility] has been arranged by Case Manager.  Anticipate dc over the WE.    Expected Discharge Plan: Home w Home Health Services Barriers to Discharge: Barriers Resolved  Expected Discharge Plan and Services   Discharge Planning Services: CM Consult Post Acute Care Choice: Home Health Living arrangements for the past 2 months: Single Family Home Expected Discharge Date: 07/05/23               DME Arranged: Dan Humphreys rolling DME Agency: [facility] Date DME Agency Contacted: 07/05/23 Time DME Agency Contacted: 1235 Representative spoke with at DME Agency: Zollie Beckers HH Arranged: PT, OT [facility] Agency: [facility] Date [facility] Agency Contacted: 07/05/23 Time HH Agency Contacted: 1234 Representative spoke with at [facility] Agency: Lorenza Chick   Social Determinants of Health (SDOH) Interventions SDOH Screenings   Food Insecurity: No Food Insecurity (07/02/2023)  Housing: Low Risk  (07/02/2023)  Transportation Needs: No Transportation Needs (07/02/2023)  Utilities: Not At Risk (07/02/2023)  Depression (PHQ2-9): Low Risk  (04/13/2023)  Financial Resource Strain: Low Risk  (07/06/2022)  Physical Activity: Unknown (06/22/2021)  Social Connections: Unknown (07/06/2022)  Stress: No Stress Concern Present (07/06/2022)  Tobacco Use: Low Risk  (07/02/2023)    Readmission Risk Interventions     No data to display         Quintella Baton, RN, BSN  Trauma/Neuro ICU Case Manager [phone]

## 2023-07-08 NOTE — Progress Notes (Signed)
PROGRESS NOTE  Justin Patel  BMW:[id number] DOB: 05/22/1951 DOA: 07/02/2023 PCP: [name], MD   Brief Narrative: Patient is a 72 year old male with history of anemia, depression, diabetes type 2, hypertension, hyperlipidemia, CKD stage IIIa, CHF, chronic pain syndrome, left total knee arthroplasty who was admitted by trauma surgery for the management of large right flank hematoma after the fall.  Patient was admitted under trauma service.  We are consulted for the evaluation of orthostatic hypotension, blood pressure dropped into the range of 70s.  PT/OT recommending home health on discharge.  Continues to  remain orthostatic but with a stable blood pressure this morning.  Continue IV fluid for today.  Check orthostatics in the morning and possible discharge home  Assessment & Plan:  Principal Problem:   Right flank hematoma Active Problems:   Type 2 diabetes mellitus (HCC)   Essential hypertension, benign   BPH (benign prostatic hyperplasia)   Chronic pain syndrome   CKD stage 3a, GFR 45-59 ml/min (HCC)   Chronic back pain   Tremor   Hyperlipidemia   Orthostatic hypotension: Blood pressure fell with systolic in the range of 70s on 11/26.  Given 500 mL normal saline bolus.  Recommend to continue compression stockings but he tool it out Continue TED hose. Recent 2D  echo done on 10/28 showed EF of 60-65% and grade 1 diastolic dysfunction.  Profoundly orthostatic on 11/28 so he started on aggressive IV fluids.  Orthostatics again checked this morning, numbers  are better.  Systolic blood pressure dropped by 22 mmHg with a stable pulse.  Continue IV fluid for today at 100 cc/hour.  Continue compression stockings.  Started on midodrine as well.  Patient needs to be careful on ambulation, avoid getting abruptly from sitting or lying position. Patient complains of dizziness while getting up.  Right flank hematoma/fall: Management as per trauma service.  PT recommending home  health.  History of diastolic CHF: Last echo showed normal ejection fraction.  Currently appears euvolemic.  Currently on IV fluids  Chronic back pain: On narcotics, Neurontin.  Minimize narcotics as much as possible given orthostatic hypotension.  CKD stage IIIa: Currently kidney function at baseline.  Diabetes type 2: Recent A1c of 6.4.  On Tradjenta  Hyperlipidemia: On Zetia, Crestor  History of hypertension: Antihypertensive should be held due to orthostatic hypotension.  Macrocytic anemia: Follows with oncology as an outpatient.  History of Roux-en-Y jejunostomy.  His baseline hemoglobin is around 11-12.  Hemoglobin dropped to the range of 7, likely associated with hematoma.  Given unit of blood transfusion on 11/27, hemoglobin back in the range of 7, likely from hemodilution.  History of tremor: On Primidone  History of BPH: On Flomax.Will hold Flomax because of orthostatic hypotension  Hypokalemia: Supplemented  and corrected.       DVT prophylaxis:Place and maintain sequential compression device Start: 07/05/23 1705 Place TED hose Start: 07/02/23 1014 SCDs Start: 07/02/23 1012     Code Status: Full Code   Antimicrobials:  Anti-infectives (From admission, onward)    None       Subjective:  Patient seen and examined at bedside today.  Hemodynamically stable.  Orthostatic blood pressure checked.  He was standing firmly for several minutes but was still complaining of dizziness.Numbers  better than yesterday.  Does not feel ready to go home today.  Objective: Vitals:   07/07/23 1956 07/08/23 0422 07/08/23 0748 07/08/23 0836  BP: 136/80 126/76 (!) 141/77 122/82  Pulse: 79 62 66 91  Resp:  17 16 17    Temp: 98.6 F (37 C) 97.9 F (36.6 C) 97.6 F (36.4 C)   TempSrc: Oral Oral Oral   SpO2: 96% 100% 98%   Weight:  84.7 kg    Height:        Intake/Output Summary (Last 24 hours) at 07/08/2023 1126 Last data filed at 07/08/2023 0914 Gross per 24 hour  Intake  700 ml  Output 875 ml  Net -175 ml   Filed Weights   07/06/23 0423 07/07/23 0516 07/08/23 0422  Weight: 80.9 kg 79.4 kg 84.7 kg    Examination:  General exam: Overall comfortable, not in distress HEENT: PERRL Respiratory system:  no wheezes or crackles  Cardiovascular system: S1 & S2 heard, RRR.  Gastrointestinal system: Abdomen is nondistended, soft and nontender. Central nervous system: Alert and oriented Extremities: No edema, no clubbing ,no cyanosis Skin: No rashes, no ulcers,no icterus   Skin: Large area of ecchymosis/bruise on the right flank   Data Reviewed: I have personally reviewed following labs and imaging studies  CBC: Recent Labs  Lab 07/02/23 0407 07/03/23 0631 07/03/23 1552 07/05/23 1642 07/06/23 0633 07/06/23 2037 07/07/23 0538 07/08/23 0901  WBC 17.6* 9.8  --  8.0 7.8  --  6.7  --   NEUTROABS 15.1*  --   --   --   --   --   --   --   HGB 11.6* 9.7*   < > 7.9* 7.6* 8.1* 8.1* 7.4*  HCT 34.0* 29.3*   < > 24.9* 23.2* 25.5* 24.9* 23.0*  MCV 106.6* 106.9*  --  109.7* 108.9*  --  106.0*  --   PLT 149* 100*  --  94* 92*  --  100*  --    < > = values in this interval not displayed.   Basic Metabolic Panel: Recent Labs  Lab 07/02/23 0219 07/02/23 0232 07/03/23 0631 07/05/23 1646 07/06/23 0633  NA 133* 134* 133* 136 135  K 3.5 3.5 3.0* 2.9* 4.4  CL 100 100 98 102 105  CO2 18*  --  24 25 24   GLUCOSE 185* 176* 128* 113* 124*  BUN 9 9 18 16 16   CREATININE 1.71* 1.90* 1.78* 1.69* 1.42*  CALCIUM 8.7*  --  7.9* 8.9 8.8*  MG  --   --   --   --  1.8     No results found for this or any previous visit (from the past 240 hour(s)).   Radiology Studies: No results found.  Scheduled Meds:  cholecalciferol  2,000 Units Oral Daily   docusate sodium  100 mg Oral BID   ezetimibe  10 mg Oral Daily   gabapentin  300 mg Oral BID   gabapentin  600 mg Oral Q lunch   lidocaine  2 patch Transdermal Q24H   methocarbamol  1,000 mg Oral QID   midodrine  5 mg  Oral TID WC   multivitamin with minerals  1 tablet Oral Daily   pantoprazole  40 mg Oral Daily   primidone  50 mg Oral QHS   rosuvastatin  40 mg Oral Daily   sucralfate  1 g Oral QID   venlafaxine XR  75 mg Oral Q breakfast   Continuous Infusions:  sodium chloride 100 mL/hr at 07/08/23 1057     LOS: 5 days   Burnadette Pop, MD [facility] P11/29/2024, 11:26 AM

## 2023-07-08 NOTE — Progress Notes (Signed)
   Subjective/Chief Complaint: Patient's still has significant dizziness upon ambulating.  Working with therapies.  Medicine following and assisting with blood pressure issues.  Vitals overall looks stable.   Objective: Vital signs in last 24 hours: Temp:  [97.6 F (36.4 C)-98.6 F (37 C)] 97.6 F (36.4 C) (11/29 0748) Pulse Rate:  [62-110] 66 (11/29 0748) Resp:  [16-17] 17 (11/29 0748) BP: (126-141)/(76-85) 141/77 (11/29 0748) SpO2:  [95 %-100 %] 98 % (11/29 0748) Weight:  [84.7 kg] 84.7 kg (11/29 0422) Last BM Date : 07/06/23  Intake/Output from previous day: 11/28 0701 - 11/29 0700 In: 380 [P.O.:380] Out: 875 [Urine:875] Intake/Output this shift: No intake/output data recorded.  General appearance: alert and cooperative Resp: clear to auscultation bilaterally Cardio: Normal sinus rhythm GI: Right flank hematoma stable.  Soft nontender.  Lab Results:  Recent Labs    07/06/23 0633 07/06/23 2037 07/07/23 0538  WBC 7.8  --  6.7  HGB 7.6* 8.1* 8.1*  HCT 23.2* 25.5* 24.9*  PLT 92*  --  100*   BMET Recent Labs    07/05/23 1646 07/06/23 0633  NA 136 135  K 2.9* 4.4  CL 102 105  CO2 25 24  GLUCOSE 113* 124*  BUN 16 16  CREATININE 1.69* 1.42*  CALCIUM 8.9 8.8*   PT/INR No results for input(s): "LABPROT", "INR" in the last 72 hours. ABG No results for input(s): "PHART", "HCO3" in the last 72 hours.  Invalid input(s): "PCO2", "PO2"  Studies/Results: No results found.  Anti-infectives: Anti-infectives (From admission, onward)    None       Assessment/Plan:    LOS: 5 days  Fall from standing Right flank hematoma ABL anemia Orthostatic hypotension  - hgb 8.1.  Yesterday  Recheck H&H today.   Still having some dizziness so hold on discharge and continue to work with therapies   Appreciate internal medicine input     - ice/binder/k pad prn to side - continue norco for pain control  - consulted internal medicine yesterday, appreciate  assistance   - TED hose ordered - continue therapies and check orthostatics q shift   FEN: reg diet, SLIV VTE: on hold in setting of ABL anemia  ID: no abx indicated    Dispo: Continue therapies and monitor orthostatics  Discharge once more stable given history of fall  Clovis Pu Guss Farruggia MD  07/08/2023 Moderate

## 2023-07-08 NOTE — Progress Notes (Signed)
Physical Therapy Treatment Patient Details Name: Justin Patel MRN: [id number] DOB: 09/09/1950 Today's Date: 07/08/2023   History of Present Illness 72 yo male presented to ED 11/23 after fall hitting back with Rt flank hematoma. PMhx: ACDF, anemia, skin CA, depression, DM, HTN, HLD, CKD, CHF, laminectomy, Lt TKA    PT Comments  Vitals and activity tolerance steadily improving. TED hose donned prior to mobilizing. Pt still positive for mild orthostatic hypotension (see vitals flowsheet) and reports slight dizziness with transition to standing; MD present. Pt ambulating 150 ft with a walker at a contact guard assist level. Increased stability noted this session.  Recommend HHPT.   If plan is discharge home, recommend the following: A little help with walking and/or transfers   Can travel by private vehicle        Equipment Recommendations  None recommended by PT    Recommendations for Other Services       Precautions / Restrictions Precautions Precautions: Other (comment);Fall Precaution Comments: abdominal binder, compression stockings Restrictions Weight Bearing Restrictions: No     Mobility  Bed Mobility Overal bed mobility: Modified Independent                  Transfers Overall transfer level: Needs assistance Equipment used: Rolling walker (2 wheels) Transfers: Sit to/from Stand Sit to Stand: Supervision           General transfer comment: Slow to rise    Ambulation/Gait Ambulation/Gait assistance: Contact guard assist Gait Distance (Feet): 150 Feet Assistive device: Rolling walker (2 wheels) Gait Pattern/deviations: Step-through pattern, Decreased stride length Gait velocity: decreased     General Gait Details: Slow and steady pace   Stairs             Wheelchair Mobility     Tilt Bed    Modified Rankin (Stroke Patients Only)       Balance Overall balance assessment: Needs assistance Sitting-balance support: Feet  unsupported, Bilateral upper extremity supported, No upper extremity supported Sitting balance-Leahy Scale: Good Sitting balance - Comments: EOB unsupported   Standing balance support: Single extremity supported, During functional activity Standing balance-Leahy Scale: Fair Standing balance comment: patient states he feels more stable with RW                            Cognition Arousal: Alert Behavior During Therapy: WFL for tasks assessed/performed Overall Cognitive Status: Within Functional Limits for tasks assessed                                          Exercises      General Comments        Pertinent Vitals/Pain Pain Assessment Pain Assessment: Faces Faces Pain Scale: Hurts little more Pain Location: R flank Pain Descriptors / Indicators: Discomfort, Grimacing, Guarding Pain Intervention(s): Monitored during session, Limited activity within patient's tolerance    Home Living                          Prior Function            PT Goals (current goals can now be found in the care plan section) Acute Rehab PT Goals Potential to Achieve Goals: Good Progress towards PT goals: Progressing toward goals    Frequency    Min 1X/week  PT Plan      Co-evaluation              AM-PAC PT "6 Clicks" Mobility   Outcome Measure  Help needed turning from your back to your side while in a flat bed without using bedrails?: None Help needed moving from lying on your back to sitting on the side of a flat bed without using bedrails?: None Help needed moving to and from a bed to a chair (including a wheelchair)?: A Little Help needed standing up from a chair using your arms (e.g., wheelchair or bedside chair)?: A Little Help needed to walk in hospital room?: A Little Help needed climbing 3-5 steps with a railing? : A Little 6 Click Score: 20    End of Session Equipment Utilized During Treatment: Gait belt;Other (comment)  (TED hose) Activity Tolerance: Patient tolerated treatment well Patient left: with call bell/phone within reach;in chair;with chair alarm set Nurse Communication: Mobility status PT Visit Diagnosis: Unsteadiness on feet (R26.81);Other abnormalities of gait and mobility (R26.89);History of falling (Z91.81);Difficulty in walking, not elsewhere classified (R26.2)     Time: 1610-9604 PT Time Calculation (min) (ACUTE ONLY): 37 min  Charges:    $Therapeutic Activity: 23-37 mins PT General Charges $$ ACUTE PT VISIT: 1 Visit                     Justin Patel, PT, DPT Acute Rehabilitation Services Office [phone]    Justin Patel 07/08/2023, 10:50 AM

## 2023-07-09 ENCOUNTER — Inpatient Hospital Stay (HOSPITAL_COMMUNITY): Payer: Medicare PPO

## 2023-07-09 DIAGNOSIS — S301XXA Contusion of abdominal wall, initial encounter: Secondary | ICD-10-CM | POA: Diagnosis not present

## 2023-07-09 LAB — GLUCOSE, CAPILLARY: Glucose-Capillary: 94 mg/dL (ref 70–99)

## 2023-07-09 LAB — CBC
HCT: 25.9 % — ABNORMAL LOW (ref 39.0–52.0)
Hemoglobin: 8.4 g/dL — ABNORMAL LOW (ref 13.0–17.0)
MCH: 35.7 pg — ABNORMAL HIGH (ref 26.0–34.0)
MCHC: 32.4 g/dL (ref 30.0–36.0)
MCV: 110.2 fL — ABNORMAL HIGH (ref 80.0–100.0)
Platelets: 139 10*3/uL — ABNORMAL LOW (ref 150–400)
RBC: 2.35 MIL/uL — ABNORMAL LOW (ref 4.22–5.81)
RDW: 17.3 % — ABNORMAL HIGH (ref 11.5–15.5)
WBC: 6.1 10*3/uL (ref 4.0–10.5)
nRBC: 0 % (ref 0.0–0.2)

## 2023-07-09 MED ORDER — OXYCODONE HCL 5 MG PO TABS
10.0000 mg | ORAL_TABLET | Freq: Four times a day (QID) | ORAL | Status: DC | PRN
  Administered 2023-07-09 – 2023-07-11 (×7): 10 mg via ORAL
  Filled 2023-07-09 (×7): qty 2

## 2023-07-09 MED ORDER — MECLIZINE HCL 25 MG PO TABS
25.0000 mg | ORAL_TABLET | Freq: Three times a day (TID) | ORAL | Status: DC
  Administered 2023-07-09 – 2023-07-11 (×7): 25 mg via ORAL
  Filled 2023-07-09 (×9): qty 1

## 2023-07-09 NOTE — Progress Notes (Signed)
PROGRESS NOTE  Justin Patel  ZOX:[id number] DOB: 08-31-1950 DOA: 07/02/2023 PCP: [name], MD   Brief Narrative: Patient is a 72 year old male with history of anemia, depression, diabetes type 2, hypertension, hyperlipidemia, CKD stage IIIa, CHF, chronic pain syndrome, left total knee arthroplasty who was admitted by trauma surgery for the management of large right flank hematoma after the fall.  Patient was admitted under trauma service.  We are consulted for the evaluation of orthostatic hypotension, blood pressure dropped into the range of 70s.  PT/OT recommending home health on discharge.  Hospital course remarkable for persistent dizziness despite resolution of orthostatic hypotension.  Ordered meclizine and MRI of the brain  Assessment & Plan:  Principal Problem:   Right flank hematoma Active Problems:   Type 2 diabetes mellitus (HCC)   Essential hypertension, benign   BPH (benign prostatic hyperplasia)   Chronic pain syndrome   CKD stage 3a, GFR 45-59 ml/min (HCC)   Chronic back pain   Tremor   Hyperlipidemia   Orthostatic hypotension/dizziness:  Recent 2D  echo done on 10/28 showed EF of 60-65% and grade 1 diastolic dysfunction.  Profoundly orthostatic on 11/28 so he was started on aggressive IV fluids.  Orthostatic vitals checked this morning, they are negative.  IV fluids to be discontinued.  Continue midodrine. continue compression stockings. Patient complains of persistent dizziness.  He says the room is spinning around him.  Could be BPPV as well.  Started meclizine.  Will get MRI of the brain to rule out underlying stroke.  Right flank hematoma/fall: Management as per trauma service.  PT recommending home health.  History of diastolic CHF: Last echo showed normal ejection fraction.  Currently appears euvolemic.   Chronic back pain: On narcotics, Neurontin.  Minimize narcotics as much as possible given orthostatic hypotension.  CKD stage IIIa: Currently  kidney function at baseline.  Diabetes type 2: Recent A1c of 6.4.  On Tradjenta  Hyperlipidemia: On Zetia, Crestor  History of hypertension: Antihypertensive should be held due to orthostatic hypotension.  Macrocytic anemia: Follows with oncology as an outpatient.  History of Roux-en-Y jejunostomy.  His baseline hemoglobin is around 11-12.  Hemoglobin dropped to the range of 7, likely associated with hematoma.  Given unit of blood transfusion on 11/27, hemoglobin back in the range of 8  History of tremor: On Primidone  History of BPH: On Flomax.Will hold Flomax because of orthostatic hypotension  Hypokalemia: Supplemented  and corrected.       DVT prophylaxis:Place and maintain sequential compression device Start: 07/05/23 1705 Place TED hose Start: 07/02/23 1014 SCDs Start: 07/02/23 1012     Code Status: Full Code   Antimicrobials:  Anti-infectives (From admission, onward)    None       Subjective:  Patient seen and examined at bedside today.  Orthostatic vitals taken this morning are negative.  He is still complaining of dizziness.  He said he has dizziness when he moves his head.  Started on meclizine.  Discussed about getting an MRI.    Objective: Vitals:   07/09/23 0820 07/09/23 0821 07/09/23 0823 07/09/23 0826  BP:      Pulse: 67 79 90 89  Resp:      Temp:      TempSrc:      SpO2:      Weight:      Height:        Intake/Output Summary (Last 24 hours) at 07/09/2023 1128 Last data filed at 07/08/2023 2311 Gross per 24  hour  Intake 1977.67 ml  Output 550 ml  Net 1427.67 ml   Filed Weights   07/07/23 0516 07/08/23 0422 07/09/23 0500  Weight: 79.4 kg 84.7 kg 84.7 kg    Examination:  General exam: Overall comfortable, not in distress HEENT: PERRL Respiratory system:  no wheezes or crackles  Cardiovascular system: S1 & S2 heard, RRR.  Gastrointestinal system: Abdomen is nondistended, soft and nontender. Central nervous system: Alert and  oriented Extremities: No edema, no clubbing ,no cyanosis Skin: Large area of ecchymosis/bruise on the right flank   Data Reviewed: I have personally reviewed following labs and imaging studies  CBC: Recent Labs  Lab 07/03/23 0631 07/03/23 1552 07/05/23 1642 07/06/23 0633 07/06/23 2037 07/07/23 0538 07/08/23 0901 07/09/23 0249  WBC 9.8  --  8.0 7.8  --  6.7  --  6.1  HGB 9.7*   < > 7.9* 7.6* 8.1* 8.1* 7.4* 8.4*  HCT 29.3*   < > 24.9* 23.2* 25.5* 24.9* 23.0* 25.9*  MCV 106.9*  --  109.7* 108.9*  --  106.0*  --  110.2*  PLT 100*  --  94* 92*  --  100*  --  139*   < > = values in this interval not displayed.   Basic Metabolic Panel: Recent Labs  Lab 07/03/23 0631 07/05/23 1646 07/06/23 0633  NA 133* 136 135  K 3.0* 2.9* 4.4  CL 98 102 105  CO2 24 25 24   GLUCOSE 128* 113* 124*  BUN 18 16 16   CREATININE 1.78* 1.69* 1.42*  CALCIUM 7.9* 8.9 8.8*  MG  --   --  1.8     No results found for this or any previous visit (from the past 240 hour(s)).   Radiology Studies: No results found.  Scheduled Meds:  cholecalciferol  2,000 Units Oral Daily   docusate sodium  100 mg Oral BID   ezetimibe  10 mg Oral Daily   gabapentin  300 mg Oral BID   gabapentin  600 mg Oral Q lunch   lidocaine  2 patch Transdermal Q24H   meclizine  25 mg Oral TID   methocarbamol  1,000 mg Oral QID   midodrine  5 mg Oral TID WC   multivitamin with minerals  1 tablet Oral Daily   pantoprazole  40 mg Oral Daily   primidone  50 mg Oral QHS   rosuvastatin  40 mg Oral Daily   sucralfate  1 g Oral QID   venlafaxine XR  75 mg Oral Q breakfast   Continuous Infusions:     LOS: 6 days   Burnadette Pop, MD [facility] P11/30/2024, 11:28 AM

## 2023-07-09 NOTE — Plan of Care (Signed)
  Problem: Education: Goal: Knowledge of General Education information will improve Description: Including pain rating scale, medication(s)/side effects and non-pharmacologic comfort measures 07/09/2023 0427 by Dudley Major, RN Outcome: Progressing 07/09/2023 0427 by Dudley Major, RN Outcome: Progressing   Problem: Health Behavior/Discharge Planning: Goal: Ability to manage health-related needs will improve 07/09/2023 0427 by Dudley Major, RN Outcome: Progressing 07/09/2023 0427 by Dudley Major, RN Outcome: Progressing   Problem: Clinical Measurements: Goal: Ability to maintain clinical measurements within normal limits will improve 07/09/2023 0427 by Dudley Major, RN Outcome: Progressing 07/09/2023 0427 by Dudley Major, RN Outcome: Progressing Goal: Will remain free from infection 07/09/2023 0427 by Dudley Major, RN Outcome: Progressing 07/09/2023 0427 by Dudley Major, RN Outcome: Progressing Goal: Diagnostic test results will improve 07/09/2023 0427 by Dudley Major, RN Outcome: Progressing 07/09/2023 0427 by Dudley Major, RN Outcome: Progressing Goal: Respiratory complications will improve 07/09/2023 0427 by Dudley Major, RN Outcome: Progressing 07/09/2023 0427 by Dudley Major, RN Outcome: Progressing Goal: Cardiovascular complication will be avoided 07/09/2023 0427 by Dudley Major, RN Outcome: Progressing 07/09/2023 0427 by Dudley Major, RN Outcome: Progressing   Problem: Activity: Goal: Risk for activity intolerance will decrease 07/09/2023 0427 by Dudley Major, RN Outcome: Progressing 07/09/2023 0427 by Dudley Major, RN Outcome: Progressing   Problem: Nutrition: Goal: Adequate nutrition will be maintained 07/09/2023 0427 by Dudley Major, RN Outcome: Progressing 07/09/2023 0427 by Dudley Major, RN Outcome: Progressing   Problem: Coping: Goal: Level of anxiety will decrease 07/09/2023 0427 by Dudley Major, RN Outcome:  Progressing 07/09/2023 0427 by Dudley Major, RN Outcome: Progressing   Problem: Elimination: Goal: Will not experience complications related to bowel motility 07/09/2023 0427 by Dudley Major, RN Outcome: Progressing 07/09/2023 0427 by Dudley Major, RN Outcome: Progressing Goal: Will not experience complications related to urinary retention 07/09/2023 0427 by Dudley Major, RN Outcome: Progressing 07/09/2023 0427 by Dudley Major, RN Outcome: Progressing   Problem: Pain Management: Goal: General experience of comfort will improve 07/09/2023 0427 by Dudley Major, RN Outcome: Progressing 07/09/2023 0427 by Dudley Major, RN Outcome: Progressing   Problem: Safety: Goal: Ability to remain free from injury will improve 07/09/2023 0427 by Dudley Major, RN Outcome: Progressing 07/09/2023 0427 by Dudley Major, RN Outcome: Progressing   Problem: Skin Integrity: Goal: Risk for impaired skin integrity will decrease 07/09/2023 0427 by Dudley Major, RN Outcome: Progressing 07/09/2023 0427 by Dudley Major, RN Outcome: Progressing

## 2023-07-09 NOTE — Progress Notes (Signed)
   Subjective/Chief Complaint: Patient continues to complain about dizziness when he ambulates.  Also complains that his pain is poorly controlled.  States his pain is an 8 out of 10 to his right flank.   Objective: Vital signs in last 24 hours: Temp:  [97.8 F (36.6 C)-98.4 F (36.9 C)] 98.4 F (36.9 C) (11/30 0801) Pulse Rate:  [63-90] 89 (11/30 0826) Resp:  [16-19] 16 (11/30 0801) BP: (127-152)/(80-107) 127/91 (11/30 0801) SpO2:  [97 %-100 %] 100 % (11/30 0801) Weight:  [84.7 kg] 84.7 kg (11/30 0500) Last BM Date : 07/06/23  Intake/Output from previous day: 11/29 0701 - 11/30 0700 In: 2297.7 [P.O.:740; I.V.:1557.7] Out: 550 [Urine:550] Intake/Output this shift: No intake/output data recorded.  General appearance: alert and cooperative Cardio: Normal sinus rhythm GI: Soft nontender with large right flank hematoma noted.  No increasing in size or change Neurologic: Grossly normal  Lab Results:  Recent Labs    07/07/23 0538 07/08/23 0901 07/09/23 0249  WBC 6.7  --  6.1  HGB 8.1* 7.4* 8.4*  HCT 24.9* 23.0* 25.9*  PLT 100*  --  139*   BMET No results for input(s): "NA", "K", "CL", "CO2", "GLUCOSE", "BUN", "CREATININE", "CALCIUM" in the last 72 hours. PT/INR No results for input(s): "LABPROT", "INR" in the last 72 hours. ABG No results for input(s): "PHART", "HCO3" in the last 72 hours.  Invalid input(s): "PCO2", "PO2"  Studies/Results: No results found.  Anti-infectives: Anti-infectives (From admission, onward)    None       Assessment/Plan:    LOS: 6 days  Fall from standing Right flank hematoma ABL anemia Orthostatic hypotension  - hgb 8.4.  Yesterday      Patient complains of more dizziness with ambulation.  Also does not feel his pain is well-controlled  Appreciate internal medicine input    Patient reluctant to go home for fear of falling.  Question if he would benefit from rehab consultation to see if inpatient rehab be useful to  him or not.   - ice/binder/k pad prn to side - continue norco for pain control  - consulted internal medicine yesterday, appreciate assistance   - TED hose ordered - continue therapies and check orthostatics q shift   FEN: reg diet, SLIV VTE: on hold in setting of ABL anemia  ID: no abx indicated    Dispo: Continue therapies and monitor orthostatics     Clovis Pu Sherrica Niehaus MD  07/09/2023 Moderate complexity

## 2023-07-09 NOTE — Progress Notes (Signed)
Inpatient Rehab Admissions Coordinator:  Consult received. Reviewed with Dr. Natale Lay. Pt is too functional to warrant a CIR admission. Saw pt at bedside to update him. Pt stated he would like to proceed with HH. TOC made aware.   Wolfgang Phoenix, MS, CCC-SLP Admissions Coordinator [phone]

## 2023-07-10 DIAGNOSIS — S301XXA Contusion of abdominal wall, initial encounter: Secondary | ICD-10-CM | POA: Diagnosis not present

## 2023-07-10 LAB — GLUCOSE, CAPILLARY
Glucose-Capillary: 95 mg/dL (ref 70–99)
Glucose-Capillary: 92 mg/dL (ref 70–99)
Glucose-Capillary: 170 mg/dL — ABNORMAL HIGH (ref 70–99)

## 2023-07-10 MED ORDER — DIAZEPAM 5 MG PO TABS
5.0000 mg | ORAL_TABLET | Freq: Once | ORAL | Status: AC
  Administered 2023-07-10: 5 mg via ORAL
  Filled 2023-07-10: qty 1

## 2023-07-10 NOTE — Plan of Care (Signed)
Educated concerning D/C of midodrine and initation of valium one time dose

## 2023-07-10 NOTE — Progress Notes (Signed)
   Subjective/Chief Complaint: Patient still feeling dizziness.  Says he is about the same.  Pain seems better controlled today.   Objective: Vital signs in last 24 hours: Temp:  [97.9 F (36.6 C)-98.9 F (37.2 C)] 98.6 F (37 C) (12/01 0747) Pulse Rate:  [66-90] 71 (12/01 0747) Resp:  [16-18] 18 (12/01 0747) BP: (140-165)/(76-87) 165/87 (12/01 0747) SpO2:  [95 %-99 %] 97 % (12/01 0747) Weight:  [84.6 kg] 84.6 kg (12/01 0515) Last BM Date : 07/06/23  Intake/Output from previous day: No intake/output data recorded. Intake/Output this shift: No intake/output data recorded.  General appearance: alert and cooperative Resp: clear to auscultation bilaterally Cardio: Normal sinus rhythm GI: Soft nontender.  Stable right flank and right hip hematoma  Lab Results:  Recent Labs    07/08/23 0901 07/09/23 0249  WBC  --  6.1  HGB 7.4* 8.4*  HCT 23.0* 25.9*  PLT  --  139*   BMET No results for input(s): "NA", "K", "CL", "CO2", "GLUCOSE", "BUN", "CREATININE", "CALCIUM" in the last 72 hours. PT/INR No results for input(s): "LABPROT", "INR" in the last 72 hours. ABG No results for input(s): "PHART", "HCO3" in the last 72 hours.  Invalid input(s): "PCO2", "PO2"  Studies/Results: MR BRAIN WO CONTRAST  Result Date: 07/09/2023 CLINICAL DATA:  Syncope/presyncope. Cerebrovascular cause suspected. EXAM: MRI HEAD WITHOUT CONTRAST TECHNIQUE: Multiplanar, multiecho pulse sequences of the brain and surrounding structures were obtained without intravenous contrast. COMPARISON:  CT head without contrast 07/02/2023. MR head 05/27/2019 FINDINGS: Brain: Progressive atrophy and white matter disease is present bilaterally. No acute infarct, hemorrhage, or mass lesion is present. The ventricles are proportionate to the degree of atrophy. Deep brain nuclei are within normal limits. No significant extraaxial fluid collection is present. The brainstem and cerebellum are within normal limits. The  internal auditory canals are within normal limits. Midline structures are within normal limits. Vascular: Normal flow voids. Skull and upper cervical spine: Cervical spine fusion is noted. The craniocervical junction is normal. Marrow signal is normal. Sinuses/Orbits: Bilateral mastoid effusions are present. Left maxillary antrostomy and ethmoid resection is noted. No active sinus disease is present. The globes and orbits are within normal limits. IMPRESSION: 1. No acute intracranial abnormality. 2. Progressive atrophy and white matter disease bilaterally. This likely reflects the sequela of chronic microvascular ischemia. 3. Bilateral mastoid effusions. Electronically Signed   By: Marin Roberts M.D.   On: 07/09/2023 13:49    Anti-infectives: Anti-infectives (From admission, onward)    None       Assessment/Plan:     LOS: 7 days   Fall from standing Right flank hematoma ABL anemia stable Orthostatic hypotension continues to be an issue.  Medicine following.  Evaluated by rehab but not felt to be an appropriate candidate.  Discussed findings from above.  Patient undergoing therapies and states he is feeling a little more confident about going home.  Plan will be for discharge Monday with home health. -        Patient complains of more dizziness with ambulation.   Also does not feel his pain is well-controlled   Appreciate internal medicine input     Patient reluctant to go home for fear of falling. Clovis Pu Brok Stocking  MD 07/10/2023 Straightforward

## 2023-07-10 NOTE — Progress Notes (Signed)
PROGRESS NOTE  Justin Patel  VQQ:[id number] DOB: [birth year] DOA: 07/02/2023 PCP: [name], MD   Brief Narrative: Patient is a 72 year old male with history of anemia, depression, diabetes type 2, hypertension, hyperlipidemia, CKD stage IIIa, CHF, chronic pain syndrome, left total knee arthroplasty who was admitted by trauma surgery for the management of large right flank hematoma after the fall.  Patient was admitted under trauma service.  We are consulted for the evaluation of orthostatic hypotension, blood pressure dropped into the range of 70s.  PT/OT recommending home health on discharge.  Hospital course remarkable for persistent dizziness despite resolution of orthostatic hypotension.  Assessment & Plan:  Principal Problem:   Right flank hematoma Active Problems:   Type 2 diabetes mellitus (HCC)   Essential hypertension, benign   BPH (benign prostatic hyperplasia)   Chronic pain syndrome   CKD stage 3a, GFR 45-59 ml/min (HCC)   Chronic back pain   Tremor   Hyperlipidemia   Orthostatic hypotension/dizziness:  Recent 2D  echo done on 10/28 showed EF of 60-65% and grade 1 diastolic dysfunction.  Profoundly orthostatic on 11/28 so he was started on aggressive IV fluids.  Orthostatic vitals checked on 11/30  morning:negative.  IV fluids discontinued.  Continue midodrine. continue compression stockings. Patient complained of persistent dizziness.  He said the room is spinning around him.  Could be BPPV as well.  Started meclizine.  MRI of the brain did not show any acute findings. Patient is still complains of dizziness today.  Unclear etiology.  Continue meclizine. We will give a dose of diazepam today.  Right flank hematoma/fall: Management as per trauma service.  PT recommending home health.  History of diastolic CHF: Last echo showed normal ejection fraction.  Currently appears euvolemic.   Chronic back pain: On narcotics, Neurontin.   CKD stage IIIa: Currently  kidney function at baseline.  Diabetes type 2: Recent A1c of 6.4.  On Tradjenta  Hyperlipidemia: On Zetia, Crestor  History of hypertension: Antihypertensive on hold.  If he becomes currently hypertensive, need to start home medications  Macrocytic anemia: Follows with oncology as an outpatient.  History of Roux-en-Y jejunostomy.  His baseline hemoglobin is around 11-12.  Hemoglobin dropped to the range of 7, likely associated with hematoma.  Given unit of blood transfusion on 11/27, hemoglobin back in the range of 8  History of tremor: On Primidone  History of BPH: On Flomax.Currently  Flomax on hold because of orthostatic hypotension.Resume on dc  Hypokalemia: Supplemented  and corrected.       DVT prophylaxis:Place and maintain sequential compression device Start: 07/05/23 1705 Place TED hose Start: 07/02/23 1014 SCDs Start: 07/02/23 1012     Code Status: Full Code   Antimicrobials:  Anti-infectives (From admission, onward)    None       Subjective:  Patient seen and examined at bedside today.  Lying in bed.  Despite getting meclizine,he still complains of dizziness while turning his head.  Objective: Vitals:   07/09/23 1554 07/09/23 1956 07/10/23 0515 07/10/23 0747  BP: (!) 140/76 (!) 144/80 (!) 144/83 (!) 165/87  Pulse: 66 81 78 71  Resp: 16 17 18 18   Temp: 98.1 F (36.7 C) 98.9 F (37.2 C) 97.9 F (36.6 C) 98.6 F (37 C)  TempSrc: Oral Oral Oral Oral  SpO2: 99% 95% 95% 97%  Weight:   84.6 kg   Height:        Intake/Output Summary (Last 24 hours) at 07/10/2023 1132 Last data filed at  07/10/2023 0700 Gross per 24 hour  Intake 240 ml  Output --  Net 240 ml   Filed Weights   07/08/23 0422 07/09/23 0500 07/10/23 0515  Weight: 84.7 kg 84.7 kg 84.6 kg    Examination:  General exam: Overall comfortable, not in distress HEENT: PERRL Respiratory system:  no wheezes or crackles  Cardiovascular system: S1 & S2 heard, RRR.  Gastrointestinal system:  Abdomen is nondistended, soft and nontender. Central nervous system: Alert and oriented Extremities: No edema, no clubbing ,no cyanosis Skin: Large area of ecchymosis/bruise on the right flank   Data Reviewed: I have personally reviewed following labs and imaging studies  CBC: Recent Labs  Lab 07/05/23 1642 07/06/23 0633 07/06/23 2037 07/07/23 0538 07/08/23 0901 07/09/23 0249  WBC 8.0 7.8  --  6.7  --  6.1  HGB 7.9* 7.6* 8.1* 8.1* 7.4* 8.4*  HCT 24.9* 23.2* 25.5* 24.9* 23.0* 25.9*  MCV 109.7* 108.9*  --  106.0*  --  110.2*  PLT 94* 92*  --  100*  --  139*   Basic Metabolic Panel: Recent Labs  Lab 07/05/23 1646 07/06/23 0633  NA 136 135  K 2.9* 4.4  CL 102 105  CO2 25 24  GLUCOSE 113* 124*  BUN 16 16  CREATININE 1.69* 1.42*  CALCIUM 8.9 8.8*  MG  --  1.8     No results found for this or any previous visit (from the past 240 hour(s)).   Radiology Studies: MR BRAIN WO CONTRAST  Result Date: 07/09/2023 CLINICAL DATA:  Syncope/presyncope. Cerebrovascular cause suspected. EXAM: MRI HEAD WITHOUT CONTRAST TECHNIQUE: Multiplanar, multiecho pulse sequences of the brain and surrounding structures were obtained without intravenous contrast. COMPARISON:  CT head without contrast 07/02/2023. MR head 05/27/2019 FINDINGS: Brain: Progressive atrophy and white matter disease is present bilaterally. No acute infarct, hemorrhage, or mass lesion is present. The ventricles are proportionate to the degree of atrophy. Deep brain nuclei are within normal limits. No significant extraaxial fluid collection is present. The brainstem and cerebellum are within normal limits. The internal auditory canals are within normal limits. Midline structures are within normal limits. Vascular: Normal flow voids. Skull and upper cervical spine: Cervical spine fusion is noted. The craniocervical junction is normal. Marrow signal is normal. Sinuses/Orbits: Bilateral mastoid effusions are present. Left maxillary  antrostomy and ethmoid resection is noted. No active sinus disease is present. The globes and orbits are within normal limits. IMPRESSION: 1. No acute intracranial abnormality. 2. Progressive atrophy and white matter disease bilaterally. This likely reflects the sequela of chronic microvascular ischemia. 3. Bilateral mastoid effusions. Electronically Signed   By: Marin Roberts M.D.   On: 07/09/2023 13:49    Scheduled Meds:  cholecalciferol  2,000 Units Oral Daily   docusate sodium  100 mg Oral BID   ezetimibe  10 mg Oral Daily   gabapentin  300 mg Oral BID   gabapentin  600 mg Oral Q lunch   lidocaine  2 patch Transdermal Q24H   meclizine  25 mg Oral TID   methocarbamol  1,000 mg Oral QID   multivitamin with minerals  1 tablet Oral Daily   pantoprazole  40 mg Oral Daily   primidone  50 mg Oral QHS   rosuvastatin  40 mg Oral Daily   sucralfate  1 g Oral QID   venlafaxine XR  75 mg Oral Q breakfast   Continuous Infusions:     LOS: 7 days   Burnadette Pop, MD [facility] P12/08/2022, 11:32  AM

## 2023-07-11 DIAGNOSIS — S301XXA Contusion of abdominal wall, initial encounter: Secondary | ICD-10-CM | POA: Diagnosis not present

## 2023-07-11 LAB — GLUCOSE, CAPILLARY: Glucose-Capillary: 134 mg/dL — ABNORMAL HIGH (ref 70–99)

## 2023-07-11 MED ORDER — MECLIZINE HCL 25 MG PO TABS: 25.0000 mg | ORAL_TABLET | Freq: Three times a day (TID) | ORAL | 0 refills | Status: AC | PRN

## 2023-07-11 NOTE — Progress Notes (Addendum)
PROGRESS NOTE  [name]  ZOX:[id number] DOB: 08/01/1951 DOA: 07/02/2023 PCP: [name], MD   Brief Narrative: Patient is a 72 year old male with history of anemia, depression, diabetes type 2, hypertension, hyperlipidemia, CKD stage IIIa, CHF, chronic pain syndrome, left total knee arthroplasty who was admitted by trauma surgery for the management of large right flank hematoma after the fall.  Patient was admitted under trauma service.  We are consulted for the evaluation of orthostatic hypotension, blood pressure dropped into the range of 70s.  PT/OT recommending home health on discharge.  Hospital course remarkable for persistent dizziness despite resolution of orthostatic hypotension.  Dizziness has finally improved today.  Stable for discharge from medical perspective.  Assessment & Plan:  Principal Problem:   Right flank hematoma Active Problems:   Type 2 diabetes mellitus (HCC)   Essential hypertension, benign   BPH (benign prostatic hyperplasia)   Chronic pain syndrome   CKD stage 3a, GFR 45-59 ml/min (HCC)   Chronic back pain   Tremor   Hyperlipidemia   Orthostatic hypotension/dizziness:  Recent 2D  echo done on 10/28 showed EF of 60-65% and grade 1 diastolic dysfunction.  Profoundly orthostatic on 11/28 so he was started on aggressive IV fluids.  Orthostatic vitals checked on 11/30  morning:negative.  IV fluids discontinued.  Patient complained of persistent dizziness.    Started meclizine.  MRI of the brain did not show any acute findings. Given a dose of diazepam on 12/1.Dizziness finally better.   Can continue meclizine as needed at home.  We recommend to follow-up with ENT as an outpatient to evaluate for possible BPPV if develops further episodes of dizziness at home.  Right flank hematoma/fall: Management as per trauma service.  PT recommending home health.  History of diastolic CHF: Last echo showed normal ejection fraction.  Currently appears euvolemic.    Chronic back pain: On pain management , Neurontin.   CKD stage IIIa: Currently kidney function at baseline.  Diabetes type 2: Recent A1c of 6.4.  On Tradjenta  Hyperlipidemia: On Zetia, Crestor  History of hypertension: Antihypertensive on hold.  Hospital course was remarkable for persistent orthostatic hypotension.  Currently he is normotensive.  Will recommend to continue holding amlodipine-valsartan and metoprolol on discharge.  I have recommended him to closely monitor his blood pressure at home and follow-up with his PCP in 1 to 2 weeks.  He needs to restart  medications if blood pressure trends up at home.  Macrocytic anemia: Follows with oncology as an outpatient.  History of Roux-en-Y jejunostomy.  His baseline hemoglobin is around 11-12.  Hemoglobin dropped to the range of 7, likely associated with hematoma.  Given unit of blood transfusion on 11/27, hemoglobin back in the range of 8.  Check CBC in a week.  History of tremor: On Primidone  History of BPH: On Flomax.  Hypokalemia: Supplemented  and corrected.  TR[facility] will sign off       DVT prophylaxis:Place and maintain sequential compression device Start: 07/05/23 1705 Place TED hose Start: 07/02/23 1014 SCDs Start: 07/02/23 1012     Code Status: Full Code   Antimicrobials:  Anti-infectives (From admission, onward)    None       Subjective:  Patient seen and examined at bedside today.  Hemodynamically stable.  Lying in bed.  He says that he feels better.  Not much dizziness like yesterday.  No nausea or vomiting.  He feels ready to go home  Objective: Vitals:   07/10/23 2105 07/11/23  0349 07/11/23 0500 07/11/23 0814  BP: (!) 138/92 (!) 140/94  132/84  Pulse: 80 71  72  Resp: 16 18  18   Temp: 98 F (36.7 C) 98.3 F (36.8 C)  98.2 F (36.8 C)  TempSrc:  Oral  Oral  SpO2: 97% 99%  (!) 81%  Weight:   84.5 kg   Height:        Intake/Output Summary (Last 24 hours) at 07/11/2023 1106 Last data filed at  07/10/2023 1804 Gross per 24 hour  Intake 240 ml  Output --  Net 240 ml   Filed Weights   07/09/23 0500 07/10/23 0515 07/11/23 0500  Weight: 84.7 kg 84.6 kg 84.5 kg    Examination:  General exam: Overall comfortable, not in distress HEENT: PERRL Respiratory system:  no wheezes or crackles  Cardiovascular system: S1 & S2 heard, RRR.  Gastrointestinal system: Abdomen is nondistended, soft and nontender. Central nervous system: Alert and oriented Extremities: No edema, no clubbing ,no cyanosis Skin: Large area of ecchymosis/bruise on the right flank   Data Reviewed: I have personally reviewed following labs and imaging studies  CBC: Recent Labs  Lab 07/05/23 1642 07/06/23 0633 07/06/23 2037 07/07/23 0538 07/08/23 0901 07/09/23 0249  WBC 8.0 7.8  --  6.7  --  6.1  HGB 7.9* 7.6* 8.1* 8.1* 7.4* 8.4*  HCT 24.9* 23.2* 25.5* 24.9* 23.0* 25.9*  MCV 109.7* 108.9*  --  106.0*  --  110.2*  PLT 94* 92*  --  100*  --  139*   Basic Metabolic Panel: Recent Labs  Lab 07/05/23 1646 07/06/23 0633  NA 136 135  K 2.9* 4.4  CL 102 105  CO2 25 24  GLUCOSE 113* 124*  BUN 16 16  CREATININE 1.69* 1.42*  CALCIUM 8.9 8.8*  MG  --  1.8     No results found for this or any previous visit (from the past 240 hour(s)).   Radiology Studies: MR BRAIN WO CONTRAST  Result Date: 07/09/2023 CLINICAL DATA:  Syncope/presyncope. Cerebrovascular cause suspected. EXAM: MRI HEAD WITHOUT CONTRAST TECHNIQUE: Multiplanar, multiecho pulse sequences of the brain and surrounding structures were obtained without intravenous contrast. COMPARISON:  CT head without contrast 07/02/2023. MR head 05/27/2019 FINDINGS: Brain: Progressive atrophy and white matter disease is present bilaterally. No acute infarct, hemorrhage, or mass lesion is present. The ventricles are proportionate to the degree of atrophy. Deep brain nuclei are within normal limits. No significant extraaxial fluid collection is present. The  brainstem and cerebellum are within normal limits. The internal auditory canals are within normal limits. Midline structures are within normal limits. Vascular: Normal flow voids. Skull and upper cervical spine: Cervical spine fusion is noted. The craniocervical junction is normal. Marrow signal is normal. Sinuses/Orbits: Bilateral mastoid effusions are present. Left maxillary antrostomy and ethmoid resection is noted. No active sinus disease is present. The globes and orbits are within normal limits. IMPRESSION: 1. No acute intracranial abnormality. 2. Progressive atrophy and white matter disease bilaterally. This likely reflects the sequela of chronic microvascular ischemia. 3. Bilateral mastoid effusions. Electronically Signed   By: Marin Roberts M.D.   On: 07/09/2023 13:49    Scheduled Meds:  cholecalciferol  2,000 Units Oral Daily   docusate sodium  100 mg Oral BID   ezetimibe  10 mg Oral Daily   gabapentin  300 mg Oral BID   gabapentin  600 mg Oral Q lunch   lidocaine  2 patch Transdermal Q24H   meclizine  25 mg Oral  TID   methocarbamol  1,000 mg Oral QID   multivitamin with minerals  1 tablet Oral Daily   pantoprazole  40 mg Oral Daily   primidone  50 mg Oral QHS   rosuvastatin  40 mg Oral Daily   sucralfate  1 g Oral QID   venlafaxine XR  75 mg Oral Q breakfast   Continuous Infusions:     LOS: 8 days   Burnadette Pop, MD [facility] P12/09/2022, 11:06 AM

## 2023-07-11 NOTE — Care Management Important Message (Signed)
Important Message  Patient Details  Name: Justin Patel MRN: [id number] Date of Birth: 1951-02-17   Important Message Given:  Yes - Medicare IM     Sherilyn Banker 07/11/2023, 3:23 PM

## 2023-07-11 NOTE — Progress Notes (Signed)
   Subjective/Chief Complaint: Still dizzy but he feels like its better today than yesterday. Pain overall controlled.   Objective: Vital signs in last 24 hours: Temp:  [98 F (36.7 C)-98.3 F (36.8 C)] 98.3 F (36.8 C) (12/02 0349) Pulse Rate:  [71-80] 71 (12/02 0349) Resp:  [16-18] 18 (12/02 0349) BP: (138-152)/(81-94) 140/94 (12/02 0349) SpO2:  [97 %-99 %] 99 % (12/02 0349) Weight:  [84.5 kg] 84.5 kg (12/02 0500) Last BM Date : 07/10/23  Intake/Output from previous day: 12/01 0701 - 12/02 0700 In: 240 [P.O.:240] Out: -  Intake/Output this shift: No intake/output data recorded.  General appearance: alert and cooperative Resp: clear to auscultation bilaterally Cardio: Normal sinus rhythm GI: Soft nontender.  Stable right flank and right hip hematoma  Lab Results:  Recent Labs    07/08/23 0901 07/09/23 0249  WBC  --  6.1  HGB 7.4* 8.4*  HCT 23.0* 25.9*  PLT  --  139*   BMET No results for input(s): "NA", "K", "CL", "CO2", "GLUCOSE", "BUN", "CREATININE", "CALCIUM" in the last 72 hours. PT/INR No results for input(s): "LABPROT", "INR" in the last 72 hours. ABG No results for input(s): "PHART", "HCO3" in the last 72 hours.  Invalid input(s): "PCO2", "PO2"  Studies/Results: MR BRAIN WO CONTRAST  Result Date: 07/09/2023 CLINICAL DATA:  Syncope/presyncope. Cerebrovascular cause suspected. EXAM: MRI HEAD WITHOUT CONTRAST TECHNIQUE: Multiplanar, multiecho pulse sequences of the brain and surrounding structures were obtained without intravenous contrast. COMPARISON:  CT head without contrast 07/02/2023. MR head 05/27/2019 FINDINGS: Brain: Progressive atrophy and white matter disease is present bilaterally. No acute infarct, hemorrhage, or mass lesion is present. The ventricles are proportionate to the degree of atrophy. Deep brain nuclei are within normal limits. No significant extraaxial fluid collection is present. The brainstem and cerebellum are within normal limits.  The internal auditory canals are within normal limits. Midline structures are within normal limits. Vascular: Normal flow voids. Skull and upper cervical spine: Cervical spine fusion is noted. The craniocervical junction is normal. Marrow signal is normal. Sinuses/Orbits: Bilateral mastoid effusions are present. Left maxillary antrostomy and ethmoid resection is noted. No active sinus disease is present. The globes and orbits are within normal limits. IMPRESSION: 1. No acute intracranial abnormality. 2. Progressive atrophy and white matter disease bilaterally. This likely reflects the sequela of chronic microvascular ischemia. 3. Bilateral mastoid effusions. Electronically Signed   By: Marin Roberts M.D.   On: 07/09/2023 13:49    Anti-infectives: Anti-infectives (From admission, onward)    None       Assessment/Plan:     LOS: 8 days   Fall from standing Right flank hematoma ABL anemia - stable Orthostatic hypotension continues to be an issue.  Medicine following.  Evaluated by rehab but not felt to be an appropriate candidate. PT/OT recc HH  Dizziness is at least stable to improved and he feels comfortable going home with the support of his wife. Will discuss with [facility] - appreciate their help   Eric Form, [facility] 07/11/2023, 7:54 AM Please see Amion for pager number during day hours 7:00am-4:30pm

## 2023-07-11 NOTE — Progress Notes (Signed)
Discharge instructions given to pt. Pt verbalized understanding of all teaching and hd no further questions. Pt currently in room waiting for wife to pick her up

## 2023-07-11 NOTE — Plan of Care (Signed)
  Problem: Education: Goal: Knowledge of General Education information will improve Description: Including pain rating scale, medication(s)/side effects and non-pharmacologic comfort measures Outcome: Progressing   Problem: Clinical Measurements: Goal: Will remain free from infection Outcome: Progressing Goal: Respiratory complications will improve Outcome: Progressing Goal: Cardiovascular complication will be avoided Outcome: Progressing   Problem: Pain Management: Goal: General experience of comfort will improve Outcome: Progressing   Problem: Safety: Goal: Ability to remain free from injury will improve Outcome: Progressing   Problem: Skin Integrity: Goal: Risk for impaired skin integrity will decrease Outcome: Progressing

## 2023-07-12 ENCOUNTER — Telehealth: Payer: Self-pay

## 2023-07-12 NOTE — Patient Outreach (Signed)
  Care Management  Transitions of Care Program Transitions of Care Post-discharge Initial   07/12/2023 Name: Justin Patel MRN: [id number] DOB: Sep 01, 1950  Subjective: Justin Patel is a 72 y.o. year old male who is a primary care patient of Birdie Sons, Yehuda Mao, MD. The Care Management team Engaged with patient Engaged with patient by telephone to assess and address transitions of care needs.   Consent to Services:  Patient was given information about care management services, agreed to services, and gave verbal consent to participate.   Assessment:  Date of Discharge: 07/11/23 Discharge Facility: [facility]) Type of Discharge: Inpatient Admission Primary Inpatient Discharge Diagnosis:: Contusion of abdominal wall, chronic pain  SDOH Interventions    Flowsheet Row Telephone from 07/12/2023 in [place] [facility] DEPARTMENT Telephone from 11/26/2022 in [facility] Community Care Coordination Clinical Support from 07/06/2022 in [facility] Patient Outreach Telephone from 04/07/2022 in [facility] Care Coordination from 03/09/2022 in [facility] Care Coordination  SDOH Interventions       Food Insecurity Interventions Intervention Not Indicated Intervention Not Indicated Intervention Not Indicated Intervention Not Indicated Intervention Not Indicated  Housing Interventions Intervention Not Indicated Intervention Not Indicated Intervention Not Indicated -- Intervention Not Indicated  Transportation Interventions Intervention Not Indicated Intervention Not Indicated, Patient Resources Dietitian) Intervention Not Indicated Intervention Not Indicated Intervention Not Indicated, Patient Resources Dietitian)  Utilities Interventions Intervention Not Indicated -- Intervention Not Indicated -- --  Surveyor, quantity Strain Interventions -- -- Intervention Not Indicated -- Other (Comment)  [place] Lo  WITH [facility] FOR MEICATION ASSISTANCE]  Physical Activity Interventions -- -- Intervention Not Indicated -- --  Stress Interventions -- -- Intervention Not Indicated -- --  Social Connections Interventions -- -- Intervention Not Indicated -- --        Goals Addressed   None    Outreach to the patient completed today. The patient sustained a fall on Friday November 29th when he fell in the bathroom and hit the right side of his body including his back. He sustained a hematoma and spent a few days in the hospital. He was discharged home with HHPT from [place] which starts today. He is also hypotensive and his Amlodipine and Metoprolol were stopped. BP today is 119/70. 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 He states he isn't depressed about his chronic disease because he has a strong faith and he enjoys music. 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 He is independent with ADL's including driving. RNCM to check in next week to monitor pain due to contusion.   Reviewed goals for care.  Patient verbalizes understanding of instructions and care plan provided. Patient was encouraged to make informed decisions about their care, actively participate in managing their health condition, and implement lifestyle changes as needed to promote independence and self-management of health care   The patient has been provided with contact information for the care management team and has been advised to call with any health-related questions or concerns.   Deidre Ala, RN Medical illustrator [facility] [phone]

## 2023-07-13 ENCOUNTER — Ambulatory Visit: Payer: Medicare PPO | Admitting: *Deleted

## 2023-07-13 VITALS — Ht 66.0 in | Wt 165.0 lb

## 2023-07-13 DIAGNOSIS — Z0000 Encounter for general adult medical examination without abnormal findings: Secondary | ICD-10-CM

## 2023-07-13 NOTE — Patient Instructions (Signed)
Mr. Justin Patel , Thank you for taking time to come for your Medicare Wellness Visit. I appreciate your ongoing commitment to your health goals. Please review the following plan we discussed and let me know if I can assist you in the future.   Referrals/Orders/Follow-Ups/Clinician Recommendations: Remember to call and schedule your diabetic eye exam  This is a list of the screening recommended for you and due dates:  Health Maintenance  Topic Date Due   COVID-19 Vaccine (3 - Pfizer risk series) 11/27/2019   Yearly kidney health urinalysis for diabetes  01/12/2023   Complete foot exam   04/29/2023   Eye exam for diabetics  06/22/2023   Flu Shot  11/07/2023*   Hemoglobin A1C  10/11/2023   DTaP/Tdap/Td vaccine (2 - Td or Tdap) 06/03/2024   Yearly kidney function blood test for diabetes  07/05/2024   Medicare Annual Wellness Visit  07/12/2024   Colon Cancer Screening  12/28/2027   Pneumonia Vaccine  Completed   Hepatitis C Screening  Completed   Zoster (Shingles) Vaccine  Completed   HPV Vaccine  Aged Out  *Topic was postponed. The date shown is not the original due date.    Advanced directives: (Copy Requested) Please bring a copy of your health care power of attorney and living will to the office to be added to your chart at your convenience.  Next Medicare Annual Wellness Visit scheduled for next year: Yes12/8/24 @ 1:40  Managing Pain Without Opioids Opioids are strong medicines used to treat moderate to severe pain. For some people, especially those who have long-term (chronic) pain, opioids may not be the best choice for pain management due to: Side effects like nausea, constipation, and sleepiness. The risk of addiction (opioid use disorder). The longer you take opioids, the greater your risk of addiction. Pain that lasts for more than 3 months is called chronic pain. Managing chronic pain usually requires more than one approach and is often provided by a team of health care providers  working together (multidisciplinary approach). Pain management may be done at a pain management center or pain clinic. How to manage pain without the use of opioids Use non-opioid medicines Non-opioid medicines for pain may include: Over-the-counter or prescription non-steroidal anti-inflammatory drugs (NSAIDs). These may be the first medicines used for pain. They work well for muscle and bone pain, and they reduce swelling. Acetaminophen. This over-the-counter medicine may work well for milder pain but not swelling. Antidepressants. These may be used to treat chronic pain. A certain type of antidepressant (tricyclics) is often used. These medicines are given in lower doses for pain than when used for depression. Anticonvulsants. These are usually used to treat seizures but may also reduce nerve (neuropathic) pain. Muscle relaxants. These relieve pain caused by sudden muscle tightening (spasms). You may also use a pain medicine that is applied to the skin as a patch, cream, or gel (topical analgesic), such as a numbing medicine. These may cause fewer side effects than medicines taken by mouth. Do certain therapies as directed Some therapies can help with pain management. They include: Physical therapy. You will do exercises to gain strength and flexibility. A physical therapist may teach you exercises to move and stretch parts of your body that are weak, stiff, or painful. You can learn these exercises at physical therapy visits and practice them at home. Physical therapy may also involve: Massage. Heat wraps or applying heat or cold to affected areas. Electrical signals that interrupt pain signals (transcutaneous electrical nerve stimulation,  TENS). Weak lasers that reduce pain and swelling (low-level laser therapy). Signals from your body that help you learn to regulate pain (biofeedback). Occupational therapy. This helps you to learn ways to function at home and work with less  pain. Recreational therapy. This involves trying new activities or hobbies, such as a physical activity or drawing. Mental health therapy, including: Cognitive behavioral therapy (CBT). This helps you learn coping skills for dealing with pain. Acceptance and commitment therapy (ACT) to change the way you think and react to pain. Relaxation therapies, including muscle relaxation exercises and mindfulness-based stress reduction. Pain management counseling. This may be individual, family, or group counseling.  Receive medical treatments Medical treatments for pain management include: Nerve block injections. These may include a pain blocker and anti-inflammatory medicines. You may have injections: Near the spine to relieve chronic back or neck pain. Into joints to relieve back or joint pain. Into nerve areas that supply a painful area to relieve body pain. Into muscles (trigger point injections) to relieve some painful muscle conditions. A medical device placed near your spine to help block pain signals and relieve nerve pain or chronic back pain (spinal cord stimulation device). Acupuncture. Follow these instructions at home Medicines Take over-the-counter and prescription medicines only as told by your health care provider. If you are taking pain medicine, ask your health care providers about possible side effects to watch out for. Do not drive or use heavy machinery while taking prescription opioid pain medicine. Lifestyle  Do not use drugs or alcohol to reduce pain. If you drink alcohol, limit how much you have to: 0-1 drink a day for women who are not pregnant. 0-2 drinks a day for men. Know how much alcohol is in a drink. In the [place] one drink equals one 12 oz bottle of beer (355 mL), one 5 oz glass of wine (148 mL), or one 1 oz glass of hard liquor (44 mL). Do not use any products that contain nicotine or tobacco. These products include cigarettes, chewing tobacco, and vaping  devices, such as e-cigarettes. If you need help quitting, ask your health care provider. Eat a healthy diet and maintain a healthy weight. Poor diet and excess weight may make pain worse. Eat foods that are high in fiber. These include fresh fruits and vegetables, whole grains, and beans. Limit foods that are high in fat and processed sugars, such as fried and sweet foods. Exercise regularly. Exercise lowers stress and may help relieve pain. Ask your health care provider what activities and exercises are safe for you. If your health care provider approves, join an exercise class that combines movement and stress reduction. Examples include yoga and tai chi. Get enough sleep. Lack of sleep may make pain worse. Lower stress as much as possible. Practice stress reduction techniques as told by your therapist. General instructions Work with all your pain management providers to find the treatments that work best for you. You are an important member of your pain management team. There are many things you can do to reduce pain on your own. Consider joining an online or in-person support group for people who have chronic pain. Keep all follow-up visits. This is important. Where to find more information You can find more information about managing pain without opioids from: American Academy of Pain Medicine: painmed.org Institute for Chronic Pain: instituteforchronicpain.org American Chronic Pain Association: theacpa.org Contact a health care provider if: You have side effects from pain medicine. Your pain gets worse or does not get  better with treatments or home therapy. You are struggling with anxiety or depression. Summary Many types of pain can be managed without opioids. Chronic pain may respond better to pain management without opioids. Pain is best managed when you and a team of health care providers work together. Pain management without opioids may include non-opioid medicines, medical  treatments, physical therapy, mental health therapy, and lifestyle changes. Tell your health care providers if your pain gets worse or is not being managed well enough. This information is not intended to replace advice given to you by your health care provider. Make sure you discuss any questions you have with your health care provider. Document Revised: 11/05/2020 Document Reviewed: 11/05/2020 Elsevier Patient Education  2024 [employer].

## 2023-07-13 NOTE — Progress Notes (Signed)
Subjective:   Justin Patel is a 72 y.o. male who presents for Medicare Annual/Subsequent preventive examination.  Visit Complete: Virtual I connected with  [name] on 07/13/23 by a audio enabled telemedicine application and verified that I am speaking with the correct person using two identifiers.  Patient Location: Home  Provider Location: Office/Clinic  I discussed the limitations of evaluation and management by telemedicine. The patient expressed understanding and agreed to proceed.  Vital Signs: Because this visit was a virtual/telehealth visit, some criteria may be missing or patient reported. Any vitals not documented were not able to be obtained and vitals that have been documented are patient reported.   Cardiac Risk Factors include: advanced age (>44men, >81 women);diabetes mellitus;male gender;dyslipidemia;hypertension     Objective:    Today's Vitals   07/13/23 1416 07/13/23 1417  Weight: 165 lb (74.8 kg)   Height: 5\' 6"  (1.676 m)   PainSc:  7    Body mass index is 26.63 kg/m.     07/13/2023    2:37 PM 07/02/2023   11:07 AM 05/30/2023    2:54 PM 04/25/2023    1:22 PM 03/13/2023    8:03 PM 02/07/2023    9:04 PM 01/26/2023    2:48 PM  Advanced Directives  Does Patient Have a Medical Advance Directive? Yes Yes No Yes Yes Yes No  Type of Estate agent of [employer];Living will Healthcare Power of [place];Living will  Living will Living will Healthcare Power of [place];Living will   Does patient want to make changes to medical advance directive?  No - Patient declined No - Patient declined   No - Patient declined No - Patient declined  Copy of Healthcare Power of Attorney in Chart? No - copy requested     No - copy requested   Would patient like information on creating a medical advance directive?      No - Patient declined     Current Medications (verified) Outpatient Encounter Medications as of 07/13/2023  Medication Sig    acetaminophen (TYLENOL) 325 MG tablet Take 650 mg by mouth every 6 (six) hours as needed for moderate pain.   Calcium Carbonate-Vitamin D (CALCIUM 600/VITAMIN D PO) Take 1 tablet by mouth daily.   Cholecalciferol 50 MCG (2000 UT) TABS Take 2,000 Units by mouth daily.   docusate sodium (COLACE) 100 MG capsule Take 1 capsule (100 mg total) by mouth daily as needed for mild constipation.   esomeprazole (NEXIUM) 40 MG capsule Take 1 capsule (40 mg total) by mouth 2 (two) times daily before a meal.   ezetimibe (ZETIA) 10 MG tablet Take 1 tablet (10 mg total) by mouth daily.   furosemide (LASIX) 20 MG tablet Take 20 mg by mouth daily.   gabapentin (NEURONTIN) 300 MG capsule Take 1 capsule (300 mg total) by mouth 2 (two) times daily AND 2 capsules (600 mg total) daily with lunch.   HYDROcodone-acetaminophen (NORCO) 7.5-325 MG tablet Take 1-2 tablets by mouth every 4 (four) hours as needed for moderate pain (pain score 4-6) or severe pain (pain score 7-10).   linagliptin (TRADJENTA) 5 MG TABS tablet Take 1 tablet (5 mg total) by mouth daily.   meclizine (ANTIVERT) 25 MG tablet Take 1 tablet (25 mg total) by mouth 3 (three) times daily as needed for up to 14 days for dizziness.   methocarbamol 1000 MG TABS Take 1,000 mg by mouth 4 (four) times daily.   Multiple Vitamin (MULTIVITAMIN) capsule Take 1 capsule by  mouth daily. With copper and zinc (Patient taking differently: Take 1 capsule by mouth daily.)   ondansetron (ZOFRAN-ODT) 4 MG disintegrating tablet Allow 1-2 tablets to dissolve in your mouth every 8 hours as needed for nausea/vomiting   primidone (MYSOLINE) 50 MG tablet Take 1 tablet (50 mg total) by mouth at bedtime.   rosuvastatin (CRESTOR) 40 MG tablet Take 1 tablet (40 mg total) by mouth daily.   sucralfate (CARAFATE) 1 g tablet TAKE 1 TABLET BY MOUTH 4 TIMES DAILY   tamsulosin (FLOMAX) 0.4 MG CAPS capsule Take 2 capsules (0.8 mg total) by mouth daily. (Patient taking differently: Take 0.4 mg by  mouth in the morning and at bedtime.)   venlafaxine XR (EFFEXOR-XR) 75 MG 24 hr capsule TAKE ONE CAPSULE EVERY MORNING WITH BREAKFAST   No facility-administered encounter medications on file as of 07/13/2023.    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  removal scheduled to clear margins.   CHF (congestive heart failure) (HCC)    no longer after weight loss   Chronic kidney disease    cysts   Degenerative disc disease    Depression    Diabetes mellitus    no longer diabetic-or on meds   DJD (degenerative joint disease)    Dysplastic nevus 02/04/2016   R upper back paraspinal - severe   Family history of adverse reaction to anesthesia    sons wake up combative   Foot fracture, left 01/2022   Gastric ulcer    GERD (gastroesophageal reflux disease)    H/O hiatal hernia    Headache(784.0)    sinus   Heart murmur    History of fusion of thoracic spine 12/28/2021   History of gastric bypass    Hx of congestive heart failure    Hyperlipidemia    Hypertension    Iron deficiency anemia 02/28/2015   Lumbar scoliosis    Opiate abuse, continuous (HCC) 06/20/2015   S/P laparoscopic cholecystectomy 02/18/2020   Sacral fracture, closed (HCC)    Seizures (HCC)    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, after GI bleed   Sleep apnea    hx not now since wt loss   Stones in the urinary tract    Syncope and collapse    Past Surgical History:  Procedure Laterality Date   ANTERIOR CERVICAL DECOMP/DISCECTOMY FUSION  01/26/2012   Procedure: ANTERIOR CERVICAL DECOMPRESSION/DISCECTOMY FUSION 3 LEVELS;  Surgeon: [name], MD;  Location: [facility];  Service: Neurosurgery;   Laterality: N/A;  Cervical three-four Cervical four-five Cervical five-six Cervical six-seven , Anterior cervical decompression/diskectomy, fusion, plate   ANTERIOR LAT LUMBAR FUSION Right 04/25/2018   Procedure: Right Lumbar two-three Lumbar three-four Lumbar four-five anterior  lateral interbody fusion  with posterior percutaneous pedicle screws;  Surgeon: [name], MD;  Location: [facility] OR;  Service: Neurosurgery;  Laterality: Right;   APPENDECTOMY     CARDIAC CATHETERIZATION     [facility], normal   CARDIAC CATHETERIZATION     [place]   CHOLECYSTECTOMY     COLONOSCOPY WITH PROPOFOL N/A 12/27/2017   Procedure: COLONOSCOPY WITH PROPOFOL;  Surgeon: Toledo, [name], MD;  Location: [facility];  Service: Gastroenterology;  Laterality:  N/A;   ESOPHAGOGASTRODUODENOSCOPY (EGD) WITH PROPOFOL N/A 12/27/2017   Procedure: ESOPHAGOGASTRODUODENOSCOPY (EGD) WITH PROPOFOL;  Surgeon: Toledo, [name], MD;  Location: [facility];  Service: Gastroenterology;  Laterality: N/A;   ESOPHAGOGASTRODUODENOSCOPY (EGD) WITH PROPOFOL N/A 06/19/2021   Procedure: ESOPHAGOGASTRODUODENOSCOPY (EGD) WITH PROPOFOL;  Surgeon: [name], MD;  Location: [facility];  Service: Gastroenterology;  Laterality: N/A;   ESOPHAGOGASTRODUODENOSCOPY (EGD) WITH PROPOFOL N/A 09/25/2021   Procedure: ESOPHAGOGASTRODUODENOSCOPY (EGD) WITH PROPOFOL;  Surgeon: [name], MD;  Location: [facility];  Service: Endoscopy;  Laterality: N/A;   GASTRIC BYPASS  08/09/2008   [employer] [facility]   HARDWARE REMOVAL N/A 03/08/2022   Procedure: Removal of bilateral Thoracic ten pedicle screws;  Surgeon: [name], MD;  Location[facility] OR;  Service: Neurosurgery;  Laterality: N/A;   HERNIA REPAIR  08/09/1998   hiatal   JOINT REPLACEMENT     KNEE ARTHROSCOPY     bilateral, left x 2   LAMINECTOMY WITH POSTERIOR LATERAL ARTHRODESIS LEVEL 4 N/A 01/14/2020   Procedure: Decompressive Laminectomy Lumbar One with pedicle  screw fixation from Thoracic Ten to Lumbar Two;  Surgeon: [name], MD;  Location: [facility] OR;  Service: Neurosurgery;  Laterality: N/A;  Decompressive Laminectomy Lumbar One with pedicle screw fixation from Thoracic Ten to Lumbar Two   LUMBAR PERCUTANEOUS PEDICLE SCREW 3 LEVEL N/A 04/25/2018   Procedure: LUMBAR PERCUTANEOUS PEDICLE SCREW 3 LEVEL;  Surgeon: [name], MD;  Location: [facility] OR;  Service: Neurosurgery;  Laterality: N/A;   NASAL SINUS SURGERY     x5   OSTEOTOMY  08/10/1999   left   ROUX-EN-Y GASTRIC BYPASS     THORACIC LAMINECTOMY FOR EPIDURAL ABSCESS N/A 03/28/2022   Procedure: THORACIC WOUND WASHOUT;  Surgeon: [name], MD;  Location[facility] OR;  Service: Neurosurgery;  Laterality: N/A;   TONSILLECTOMY     TOTAL KNEE ARTHROPLASTY Left 05/09/2014   Dr. [name]   UVULOPALATOPLASTY  08/09/2009   Family History  Problem Relation Age of Onset   Dementia Mother 27   Osteoporosis Mother    Heart disease Father 40   Diabetes Father    Lymphoma Sister        lymphoma, stage 4   Ovarian cancer Sister 2       Ovarian   Lupus Sister    Prostate cancer Maternal Uncle    Skin cancer Maternal Uncle    Tongue cancer Maternal Uncle        uncle died of heart attack   Alcoholism Maternal Uncle    Lung cancer Paternal Aunt        pat aunts x 4 died of lung cancer   Lung cancer Paternal Aunt    Lung cancer Paternal Aunt    Lung cancer Paternal Aunt    Mesothelioma Cousin        paternal cousin   Social History   Socioeconomic History   Marital status: Married    Spouse name: Not on file   Number of children: 2   Years of education: Not on file   Highest education level: Not on file  Occupational History   Not on file  Tobacco Use   Smoking status: Never   Smokeless tobacco: Never  Vaping Use   Vaping status: Never Used  Substance and Sexual Activity   Alcohol use: Yes    Alcohol/week: 21.0 standard drinks of alcohol    Types: 21 Glasses of wine per week     Comment: occassionally   Drug use:  No   Sexual activity: Yes    Partners: Female  Other Topics Concern   Not on file  Social History Narrative   Lives in [place] with wife, [place]. 2 sons, Roderic Ovens, Caryn Bee 30.. 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      Diet - regular diet, limited quantities after gastric bypass      Exercise - no regular, limited by arthritis in knees, occasional water aerobics   Right handed   One story house   Social Determinants of Health   Financial Resource Strain: Low Risk  (07/13/2023)   Overall Financial Resource Strain (CARDIA)    Difficulty of Paying Living Expenses: Not hard at all  Food Insecurity: No Food Insecurity (07/13/2023)   Hunger Vital Sign    Worried About Running Out of Food in the Last Year: Never true    Ran Out of Food in the Last Year: Never true  Transportation Needs: No Transportation Needs (07/13/2023)   PRAPARE - Administrator, Civil Service (Medical): No    Lack of Transportation (Non-Medical): No  Physical Activity: Inactive (07/13/2023)   Exercise Vital Sign    Days of Exercise per Week: 0 days    Minutes of Exercise per Session: 0 min  Stress: No Stress Concern Present (07/13/2023)   [employer] of Occupational Health - Occupational Stress Questionnaire    Feeling of Stress : Only a little  Social Connections: Socially Integrated (07/13/2023)   Social Connection and Isolation Panel [NHANES]    Frequency of Communication with Friends and Family: Twice a week    Frequency of Social Gatherings with Friends and Family: More than three times a week    Attends Religious Services: More than 4 times per year    Active Member of [employer] or Organizations: Yes    Attends Engineer, structural: More than 4 times per year    Marital Status: Married    Tobacco Counseling Counseling given: Not Answered   Clinical Intake:  Pre-visit preparation completed:  Yes  Pain : No/denies pain Pain Score: 7  Pain Type: Chronic pain Pain Location: Flank (from a fall) Pain Orientation: Right Pain Descriptors / Indicators: Aching Pain Frequency: Intermittent     BMI - recorded: 26.63 Nutritional Status: BMI 25 -29 Overweight Nutritional Risks: None Diabetes: Yes CBG done?: Yes (FBS 72) CBG resulted in Enter/ Edit results?: No Did pt. bring in CBG monitor from home?: No  How often do you need to have someone help you when you read instructions, pamphlets, or other written materials from your doctor or pharmacy?: 1 - Never  Interpreter Needed?: No  Information entered by :: R. Perrin Eddleman LPN   Activities of Daily Living    07/13/2023    2:21 PM 07/02/2023   11:07 AM  In your present state of health, do you have any difficulty performing the following activities:  Hearing? 1 0  Comment wears aids   Vision? 0 0  Comment glasses   Difficulty concentrating or making decisions? 0 0  Walking or climbing stairs? 1   Dressing or bathing? 0   Doing errands, shopping? 1   Preparing Food and eating ? N   Using the Toilet? N   In the past six months, have you accidently leaked urine? N   Do you have problems with loss of bowel control? N   Managing your Medications? N   Managing your Finances?  Y   Comment wife helps   Housekeeping or managing your Housekeeping? Y     Patient Care Team: [name], MD as PCP - General (Family Medicine) Iran Ouch, MD as PCP - Cardiology (Cardiology) Drema Dallas, DO as Consulting Physician (Neurology) Earna Coder, MD as Consulting Physician (Oncology)  Indicate any recent Medical Services you may have received from other than [facility] providers in the past year (date may be approximate).     Assessment:   This is a routine wellness examination for [employer].  Hearing/Vision screen Hearing Screening - Comments:: Wears aids Vision Screening - Comments:: glasses   Goals Addressed              This Visit's Progress    Patient Stated       Wants to be able to take walks       Depression Screen    07/13/2023    2:30 PM 04/13/2023    2:43 PM 03/15/2023   11:07 AM 02/01/2023    9:03 AM 12/22/2022    3:54 PM 12/01/2022   10:53 AM 11/10/2022    2:54 PM  PHQ 2/9 Scores  PHQ - 2 Score 0 0 0 0 0 0 4  PHQ- 9 Score 3 0 0 0 0 1 10    Fall Risk    07/13/2023    2:24 PM 04/25/2023    1:21 PM 04/13/2023    2:43 PM 03/15/2023   11:07 AM 02/01/2023    9:03 AM  Fall Risk   Falls in the past year? 1 0 0 0 0  Number falls in past yr: 1  0 0 0  Injury with Fall? 1 0 0 0 0  Risk for fall due to : History of fall(s);Other (Comment)  No Fall Risks No Fall Risks No Fall Risks  Risk for fall due to: Comment dizziness      Follow up Falls evaluation completed;Falls prevention discussed Falls evaluation completed Falls evaluation completed Falls evaluation completed Falls evaluation completed    MEDICARE RISK AT HOME: Medicare Risk at Home Any stairs in or around the home?: Yes If so, are there any without handrails?: No Home free of loose throw rugs in walkways, pet beds, electrical cords, etc?: Yes Adequate lighting in your home to reduce risk of falls?: Yes Life alert?: No Use of a cane, walker or w/c?: Yes (cane) Grab bars in the bathroom?: Yes Shower chair or bench in shower?: Yes Elevated toilet seat or a handicapped toilet?: No   Cognitive Function:    03/31/2018    2:48 PM 03/30/2017    3:35 PM  MMSE - Mini Mental State Exam  Orientation to time 5 5  Orientation to Place 5 5  Registration 3 3  Attention/ Calculation 5 5  Recall 3 3  Language- name 2 objects 2 2  Language- repeat 1 1  Language- follow 3 step command 3 3  Language- read & follow direction 1 1  Write a sentence 1 1  Copy design 1 1  Total score 30 30        07/13/2023    2:37 PM 07/06/2022   12:04 PM 04/02/2019   11:28 AM  6CIT Screen  What Year? 0 points 0 points 0 points  What month? 0 points 0  points 0 points  What time? 0 points 0 points 0 points  Count back from 20 0 points 0 points 0 points  Months in reverse 0 points  0 points 0 points  Repeat phrase 0 points 0 points 0 points  Total Score 0 points 0 points 0 points    Immunizations Immunization History  Administered Date(s) Administered   Fluad Quad(high Dose 65+) 05/01/2019, 05/20/2020, 04/06/2021, 04/23/2022   Influenza, High Dose Seasonal PF 06/25/2016, 05/08/2018   Influenza,inj,Quad PF,6+ Mos 07/10/2013, 04/05/2017   Influenza-Unspecified 05/21/2012, 05/29/2014, 04/10/2015, 04/06/2017   PFIZER(Purple Top)SARS-COV-2 Vaccination 10/05/2019, 10/30/2019   PNEUMOCOCCAL CONJUGATE-20 04/23/2022   Pneumococcal Conjugate-13 06/13/2014   Pneumococcal Polysaccharide-23 03/21/2010, 04/05/2017   RSV,unspecified 05/14/2022   Rotavirus,unspecified  06/07/2022   Tdap 06/03/2014   Zoster Recombinant(Shingrix) 04/08/2021, 06/09/2021   Zoster, Live 05/10/2013    TDAP status: Up to date  Flu Vaccine status: Due, Education has been provided regarding the importance of this vaccine. Advised may receive this vaccine at local pharmacy or Health Dept. Aware to provide a copy of the vaccination record if obtained from local pharmacy or Health Dept. Verbalized acceptance and understanding.  Pneumococcal vaccine status: Up to date  Covid-19 vaccine status: Information provided on how to obtain vaccines.   Qualifies for Shingles Vaccine? Yes   Zostavax completed Yes   Shingrix Completed?: Yes  Screening Tests Health Maintenance  Topic Date Due   COVID-19 Vaccine (3 - Pfizer risk series) 11/27/2019   Diabetic kidney evaluation - Urine ACR  01/12/2023   FOOT EXAM  04/29/2023   Medicare Annual Wellness (AWV)  07/07/2023   OPHTHALMOLOGY EXAM  06/22/2023   INFLUENZA VACCINE  11/07/2023 (Originally 03/10/2023)   HEMOGLOBIN A1C  10/11/2023   DTaP/Tdap/Td (2 - Td or Tdap) 06/03/2024   Diabetic kidney evaluation - eGFR measurement   07/05/2024   Colonoscopy  12/28/2027   Pneumonia Vaccine 75+ Years old  Completed   Hepatitis C Screening  Completed   Zoster Vaccines- Shingrix  Completed   HPV VACCINES  Aged Out    Health Maintenance  Health Maintenance Due  Topic Date Due   COVID-19 Vaccine (3 - Pfizer risk series) 11/27/2019   Diabetic kidney evaluation - Urine ACR  01/12/2023   FOOT EXAM  04/29/2023   Medicare Annual Wellness (AWV)  07/07/2023   OPHTHALMOLOGY EXAM  06/22/2023    Colorectal cancer screening: Type of screening: Colonoscopy. Completed 10/2017. Repeat every 10 years  Lung Cancer Screening: (Low Dose CT Chest recommended if Age 18-80 years, 20 pack-year currently smoking OR have quit w/in 15years.) does not qualify.    Additional Screening:  Hepatitis C Screening: does qualify; Completed 03/2017  Vision Screening: Recommended annual ophthalmology exams for early detection of glaucoma and other disorders of the eye. Is the patient up to date with their annual eye exam?  No  Who is the provider or what is the name of the office in which the patient attends annual eye exams? [facility] Patient will call and schedule an appointment If pt is not established with a provider, would they like to be referred to a provider to establish care? No .   Dental Screening: Recommended annual dental exams for proper oral hygiene  Diabetic Foot Exam: Diabetic Foot Exam: Overdue, Pt has been advised about the importance in completing this exam. Pt is scheduled for diabetic foot exam on Has an appointment 07/15/23 with PCP .  Community Resource Referral / Chronic Care Management: CRR required this visit?  No   CCM required this visit?  No     Plan:     I have personally reviewed and noted the following in the patient's chart:  Medical and social history Use of alcohol, tobacco or illicit drugs  Current medications and supplements including opioid prescriptions. Patient is currently taking opioid  prescriptions. Information provided to patient regarding non-opioid alternatives. Patient advised to discuss non-opioid treatment plan with their provider. Functional ability and status Nutritional status Physical activity Advanced directives List of other physicians Hospitalizations, surgeries, and ER visits in previous 12 months Vitals Screenings to include cognitive, depression, and falls Referrals and appointments  In addition, I have reviewed and discussed with patient certain preventive protocols, quality metrics, and best practice recommendations. A written personalized care plan for preventive services as well as general preventive health recommendations were provided to patient.     Sydell Axon, LPN   [id number]/[id number]   After Visit Summary: (MyChart) Due to this being a telephonic visit, the after visit summary with patients personalized plan was offered to patient via MyChart   Nurse Notes: See routing comments

## 2023-07-15 ENCOUNTER — Ambulatory Visit: Payer: Medicare PPO | Admitting: Family Medicine

## 2023-07-15 ENCOUNTER — Encounter: Payer: Self-pay | Admitting: Family Medicine

## 2023-07-15 VITALS — BP 132/84 | HR 87 | Temp 98.4°F | Ht 66.0 in | Wt 175.6 lb

## 2023-07-15 DIAGNOSIS — R55 Syncope and collapse: Secondary | ICD-10-CM

## 2023-07-15 DIAGNOSIS — S3013XA Contusion of flank (latus) region, initial encounter: Secondary | ICD-10-CM

## 2023-07-15 DIAGNOSIS — N400 Enlarged prostate without lower urinary tract symptoms: Secondary | ICD-10-CM | POA: Diagnosis not present

## 2023-07-15 DIAGNOSIS — I10 Essential (primary) hypertension: Secondary | ICD-10-CM | POA: Diagnosis not present

## 2023-07-15 DIAGNOSIS — S301XXA Contusion of abdominal wall, initial encounter: Secondary | ICD-10-CM | POA: Diagnosis not present

## 2023-07-15 DIAGNOSIS — M51369 Other intervertebral disc degeneration, lumbar region without mention of lumbar back pain or lower extremity pain: Secondary | ICD-10-CM | POA: Diagnosis not present

## 2023-07-15 DIAGNOSIS — E119 Type 2 diabetes mellitus without complications: Secondary | ICD-10-CM | POA: Diagnosis not present

## 2023-07-15 DIAGNOSIS — Z7984 Long term (current) use of oral hypoglycemic drugs: Secondary | ICD-10-CM

## 2023-07-15 DIAGNOSIS — Z23 Encounter for immunization: Secondary | ICD-10-CM | POA: Diagnosis not present

## 2023-07-15 NOTE — Patient Instructions (Signed)
Nice to see you. Please stop your Lasix.  Please also stop your Flomax.  We are going to see if stopping those things will help with your lightheadedness. We will get lab work today and let you know what the results are. If you pass out again please go back to the emergency room. If you have difficulty urinating with stopping the Flomax please let us know.

## 2023-07-15 NOTE — Assessment & Plan Note (Addendum)
Chronic issue.  Blood pressure is adequately controlled given his recent orthostasis.  He will remain off of metoprolol, amlodipine, and valsartan.  Patient is orthostatic today.  He will discontinue Lasix and Flomax to see if those things are contributing to his lightheadedness.  They will monitor his blood pressure and if it trends up they will let us know.  Advised to seek medical attention if he has another syncopal episode.

## 2023-07-15 NOTE — Assessment & Plan Note (Signed)
Seems to be progressively improving.  We will recheck CBC today.  Patient has medicine through his pain management physician to help with discomfort.  He will continue to see them.

## 2023-07-15 NOTE — Assessment & Plan Note (Addendum)
Patient had workup through cardiology just before he ended up in the hospital for this issue.  He was found to have runs of SVT.  I will communicate with cardiology to see if they want to see him back given his episode of syncope that led to his hospitalization. Discussed that it is not uncommon to get shaking with cardiogenic syncope.  Given the lack of postictal phase and incontinence it would seem unlikely to be a seizure.

## 2023-07-15 NOTE — Assessment & Plan Note (Signed)
Patient's wife was concerned that they did not image his back while he was in the hospital.  I relayed that the imaging of his chest, abdomen, and pelvis did image his spine and they reported no new fractures.  They did not comment on any abnormalities of his spinal hardware.

## 2023-07-15 NOTE — Assessment & Plan Note (Signed)
Chronic issue.  Patient will continue Tradjenta 5 mg daily.  Check A1c.

## 2023-07-15 NOTE — Assessment & Plan Note (Signed)
Chronic issue.  Patient will stop his Flomax given his orthostasis.  Advised to monitor his urination and if it gets more troublesome he will contact us right away.

## 2023-07-15 NOTE — Progress Notes (Signed)
Marikay Alar, MD Phone: [phone]  Justin Patel is a 72 y.o. male who presents today for follow-up.  Right foot hematoma/syncope: Patient reports he got up from bed to go urinate.  He was standing to pee and then turned and things started moving and he passed out.  He hit his right flank on the side of the bathtub and had significant pain.  Patient's wife reports he had low blood pressure with EMS evaluation.  He was evaluated in the emergency department and found to have a large hematoma in the subcutaneous tissues of the right flank.  There was contrast extravasation within the hematoma suggesting active hemorrhage.  Trauma surgery evaluated and managed with pain control and monitoring.  He will was found to be orthostatic with blood pressure dropping into the range of the 70s.  He was started on aggressive IV fluids.  Also notes he was given a blood transfusion.  He had an MRI of his brain that did not reveal any acute findings.  Patient notes they stopped his amlodipine-valsartan and metoprolol.  He continues on Lasix.  He is also on Flomax.  He continues to have right flank pain though it is progressively improving.  He is taking hydrocodone for pain and he gets this from his pain specialist.  CT chest, abdomen, and pelvis did not reveal any fractures.  Patient notes some continued lightheadedness.  He notes the surgeons did not schedule him for follow-up.  At the end of the visit the patient's wife did report that he had some shaking when he had the syncopal episode prior to his hospitalization.  She and he both noted no postictal phase.  No incontinence.  Diabetes: Typically 80s-90s.  He notes no hypoglycemia.  He is on Tradjenta.  Social History   Tobacco Use  Smoking Status Never  Smokeless Tobacco Never    Current Outpatient Medications on File Prior to Visit  Medication Sig Dispense Refill   acetaminophen (TYLENOL) 325 MG tablet Take 650 mg by mouth every 6 (six) hours as  needed for moderate pain.     Calcium Carbonate-Vitamin D (CALCIUM 600/VITAMIN D PO) Take 1 tablet by mouth daily.     Cholecalciferol 50 MCG (2000 UT) TABS Take 2,000 Units by mouth daily.     docusate sodium (COLACE) 100 MG capsule Take 1 capsule (100 mg total) by mouth daily as needed for mild constipation.     esomeprazole (NEXIUM) 40 MG capsule Take 1 capsule (40 mg total) by mouth 2 (two) times daily before a meal. 180 capsule 1   ezetimibe (ZETIA) 10 MG tablet Take 1 tablet (10 mg total) by mouth daily. 90 tablet 3   gabapentin (NEURONTIN) 300 MG capsule Take 1 capsule (300 mg total) by mouth 2 (two) times daily AND 2 capsules (600 mg total) daily with lunch. 120 capsule 0   HYDROcodone-acetaminophen (NORCO) 7.5-325 MG tablet Take 1-2 tablets by mouth every 4 (four) hours as needed for moderate pain (pain score 4-6) or severe pain (pain score 7-10). 30 tablet 0   linagliptin (TRADJENTA) 5 MG TABS tablet Take 1 tablet (5 mg total) by mouth daily. 90 tablet 1   meclizine (ANTIVERT) 25 MG tablet Take 1 tablet (25 mg total) by mouth 3 (three) times daily as needed for up to 14 days for dizziness. 42 tablet 0   methocarbamol 1000 MG TABS Take 1,000 mg by mouth 4 (four) times daily. 30 tablet 1   Multiple Vitamin (MULTIVITAMIN) capsule Take 1 capsule  by mouth daily. With copper and zinc (Patient taking differently: Take 1 capsule by mouth daily.)     ondansetron (ZOFRAN-ODT) 4 MG disintegrating tablet Allow 1-2 tablets to dissolve in your mouth every 8 hours as needed for nausea/vomiting 30 tablet 0   primidone (MYSOLINE) 50 MG tablet Take 1 tablet (50 mg total) by mouth at bedtime. 30 tablet 5   rosuvastatin (CRESTOR) 40 MG tablet Take 1 tablet (40 mg total) by mouth daily. 90 tablet 1   sucralfate (CARAFATE) 1 g tablet TAKE 1 TABLET BY MOUTH 4 TIMES DAILY 360 tablet 1   venlafaxine XR (EFFEXOR-XR) 75 MG 24 hr capsule TAKE ONE CAPSULE EVERY MORNING WITH BREAKFAST 90 capsule 1   No current  facility-administered medications on file prior to visit.     ROS see history of present illness  Objective  Physical Exam Vitals:   07/15/23 1440  BP: 132/84  Pulse: 87  Temp: 98.4 F (36.9 C)  SpO2: 98%   Laying blood pressure 118/79 pulse 76 Sitting blood pressure 106/74 pulse 85 Standing blood pressure 95/63 pulse 67  BP Readings from Last 3 Encounters:  07/15/23 132/84  07/11/23 118/65  06/28/23 127/82   Wt Readings from Last 3 Encounters:  07/15/23 175 lb 9.6 oz (79.7 kg)  07/13/23 165 lb (74.8 kg)  07/11/23 186 lb 4.6 oz (84.5 kg)    Physical Exam Constitutional:      General: He is not in acute distress.    Appearance: He is not diaphoretic.  Cardiovascular:     Rate and Rhythm: Normal rate and regular rhythm.     Heart sounds: Normal heart sounds.  Pulmonary:     Effort: Pulmonary effort is normal.     Breath sounds: Normal breath sounds.  Musculoskeletal:       Back:     Comments: Patient also has bruising in his low back and down his right hip and thigh  Skin:    General: Skin is warm and dry.  Neurological:     Mental Status: He is alert.      Assessment/Plan: Please see individual problem list.  Essential hypertension, benign Assessment & Plan: Chronic issue.  Blood pressure is adequately controlled given his recent orthostasis.  He will remain off of metoprolol, amlodipine, and valsartan.  Patient is orthostatic today.  He will discontinue Lasix and Flomax to see if those things are contributing to his lightheadedness.  They will monitor his blood pressure and if it trends up they will let us know.  Advised to seek medical attention if he has another syncopal episode.  Orders: -     Basic metabolic panel  Type 2 diabetes mellitus without complication, without long-term current use of insulin (HCC) Assessment & Plan: Chronic issue.  Patient will continue Tradjenta 5 mg daily.  Check A1c.  Orders: -     Hemoglobin A1c  Hematoma of  right flank, initial encounter Assessment & Plan: Seems to be progressively improving.  We will recheck CBC today.  Patient has medicine through his pain management physician to help with discomfort.  He will continue to see them.  Orders: -     CBC  Syncope, unspecified syncope type Assessment & Plan: Patient had workup through cardiology just before he ended up in the hospital for this issue.  He was found to have runs of SVT.  I will communicate with cardiology to see if they want to see him back given his episode of syncope that led to  his hospitalization. Discussed that it is not uncommon to get shaking with cardiogenic syncope.  Given the lack of postictal phase and incontinence it would seem unlikely to be a seizure.   Benign prostatic hyperplasia, unspecified whether lower urinary tract symptoms present Assessment & Plan: Chronic issue.  Patient will stop his Flomax given his orthostasis.  Advised to monitor his urination and if it gets more troublesome he will contact us right away.   Degeneration of intervertebral disc of lumbar region, unspecified whether pain present Assessment & Plan: Patient's wife was concerned that they did not image his back while he was in the hospital.  I relayed that the imaging of his chest, abdomen, and pelvis did image his spine and they reported no new fractures.  They did not comment on any abnormalities of his spinal hardware.     Return in about 2 weeks (around 07/29/2023) for Recheck hematoma and blood pressure, 3 months transfer of care.  I have spent 51 minutes in the care of this patient regarding history taking, documentation, completion of exam, discussion of plan, placing orders, review of recent hospital discharge summary.   Marikay Alar, MD [facility]

## 2023-07-16 LAB — HEMOGLOBIN A1C
Hgb A1c MFr Bld: 5.8 %{Hb} — ABNORMAL HIGH (ref <5.7)
Mean Plasma Glucose: 120 mg/dL
eAG (mmol/L): 6.6 mmol/L

## 2023-07-16 LAB — CBC
HCT: 33.5 % — ABNORMAL LOW (ref 38.5–50.0)
Hemoglobin: 11 g/dL — ABNORMAL LOW (ref 13.2–17.1)
MCH: 34.9 pg — ABNORMAL HIGH (ref 27.0–33.0)
MCHC: 32.8 g/dL (ref 32.0–36.0)
MCV: 106.3 fL — ABNORMAL HIGH (ref 80.0–100.0)
MPV: 9.8 fL (ref 7.5–12.5)
Platelets: 350 10*3/uL (ref 140–400)
RBC: 3.15 10*6/uL — ABNORMAL LOW (ref 4.20–5.80)
RDW: 14.1 % (ref 11.0–15.0)
WBC: 6.7 10*3/uL (ref 3.8–10.8)

## 2023-07-16 LAB — BASIC METABOLIC PANEL
BUN: 17 mg/dL (ref 7–25)
CO2: 30 mmol/L (ref 20–32)
Calcium: 8.6 mg/dL (ref 8.6–10.3)
Chloride: 103 mmol/L (ref 98–110)
Creat: 1.23 mg/dL (ref 0.70–1.28)
Glucose, Bld: 135 mg/dL — ABNORMAL HIGH (ref 65–99)
Potassium: 3.9 mmol/L (ref 3.5–5.3)
Sodium: 140 mmol/L (ref 135–146)

## 2023-07-18 DIAGNOSIS — Z23 Encounter for immunization: Secondary | ICD-10-CM | POA: Diagnosis not present

## 2023-07-18 NOTE — Addendum Note (Signed)
Addended by: Prince Solian A on: 07/18/2023 10:19 AM   Modules accepted: Orders

## 2023-07-19 ENCOUNTER — Other Ambulatory Visit: Payer: Self-pay

## 2023-07-20 ENCOUNTER — Telehealth: Payer: Self-pay | Admitting: Family Medicine

## 2023-07-20 ENCOUNTER — Other Ambulatory Visit: Payer: Self-pay

## 2023-07-20 DIAGNOSIS — I10 Essential (primary) hypertension: Secondary | ICD-10-CM

## 2023-07-20 MED ORDER — VALSARTAN 80 MG PO TABS: 80.0000 mg | ORAL_TABLET | Freq: Every day | ORAL | 1 refills | Status: DC

## 2023-07-20 MED ORDER — AMLODIPINE BESYLATE 5 MG PO TABS: 5.0000 mg | ORAL_TABLET | Freq: Every day | ORAL | 1 refills | Status: DC

## 2023-07-20 NOTE — Patient Outreach (Signed)
Care Management  Transitions of Care Program Transitions of Care Post-discharge week 2   07/20/2023 Name: Justin Patel MRN: [id number] DOB: 1950-11-16  Subjective: Justin Patel is a 72 y.o. year old male who is a primary care patient of Birdie Sons, Yehuda Mao, MD. The Care Management team Engaged with patient Engaged with patient by telephone to assess and address transitions of care needs.   Consent to Services:  Patient was given information about care management services, agreed to services, and gave verbal consent to participate.   Assessment: Outreach to the patient today to follow up on healing from a fall and hematoma/flank pain. The patient states he is doing well from his fall but his blood pressure is 210/94. He states he is asymptomatic. He has taken his blood pressure twice and it is the same thing. RNCM sent a secure chat to the provider as well as an in-basket and called the office and spoke to the nurse. The initial recommendation is to go to the ER to assess for end organ failure and to get labs. The patient declines. States his BP is now 160/74. PCP notified and agreed he didn't need to go. The patient's cardiac meds were stopped when he was in the hospital. PCP will contact the patient and tell him which meds he can re-start. A follow up appointment has been scheduled for Friday 07/22/23.          SDOH Interventions    Flowsheet Row Patient Outreach from 07/20/2023 in [place] [facility] Clinical Support from 07/13/2023 in [facility] [place] Telephone from 07/12/2023 in [place]PART[facility] Telephone from 11/26/2022 in [facility] Community Care Coordination Clinical Support from 07/06/2022 in [facility] Patient Outreach Telephone from 04/07/2022 in [facility]  SDOH Interventions        Food Insecurity Interventions Intervention Not  Indicated Intervention Not Indicated Intervention Not Indicated Intervention Not Indicated Intervention Not Indicated Intervention Not Indicated  Housing Interventions Intervention Not Indicated Intervention Not Indicated Intervention Not Indicated Intervention Not Indicated Intervention Not Indicated --  Transportation Interventions Intervention Not Indicated Intervention Not Indicated Intervention Not Indicated Intervention Not Indicated, Patient Resources Dietitian) Intervention Not Indicated Intervention Not Indicated  Utilities Interventions Intervention Not Indicated Intervention Not Indicated Intervention Not Indicated -- Intervention Not Indicated --  Alcohol Usage Interventions -- Intervention Not Indicated (Score <7) -- -- -- --  Financial Strain Interventions -- Intervention Not Indicated -- -- Intervention Not Indicated --  Physical Activity Interventions -- Intervention Not Indicated -- -- Intervention Not Indicated --  Stress Interventions -- Intervention Not Indicated -- -- Intervention Not Indicated --  Social Connections Interventions -- Intervention Not Indicated -- -- Intervention Not Indicated --  Health Literacy Interventions -- Intervention Not Indicated -- -- -- --        Goals Addressed             This Visit's Progress    TOC plan of care       Current Barriers:  Knowledge Deficits related to plan of care for management of HTN and Chronic Pain   RNCM Clinical Goal(s):  Patient will verbalize understanding of plan for management of HTN and Chronic Pain as evidenced by No re-admission to the hospital  through collaboration with RN Care manager, provider, and care team.   Interventions: Evaluation of current treatment plan related to  self management and patient's adherence to plan as established  by provider   Hypertension Interventions:  (Status:  New goal.) Short Term Goal Last practice recorded BP readings:  BP Readings from Last 3 Encounters:   07/15/23 132/84  07/11/23 118/65  06/28/23 127/82   Most recent eGFR/CrCl: No results found for: "EGFR"  No components found for: "CRCL"  Evaluation of current treatment plan related to hypertension self management and patient's adherence to plan as established by provider Provided education to patient re: stroke prevention, s/s of heart attack and stroke Reviewed medications with patient and discussed importance of compliance Counseled on the importance of exercise goals with target of 150 minutes per week Discussed plans with patient for ongoing care management follow up and provided patient with direct contact information for care management team Advised patient, providing education and rationale, to monitor blood pressure daily and record, calling PCP for findings outside established parameters Screening for signs and symptoms of depression related to chronic disease state   Patient Goals/Self-Care Activities: Participate in Transition of Care Program/Attend [facility] scheduled calls Notify RN Care Manager of TOC call rescheduling needs Take all medications as prescribed Attend all scheduled provider appointments Call pharmacy for medication refills 3-7 days in advance of running out of medications Call provider office for new concerns or questions   Follow Up Plan:  Telephone follow up appointment with care management team member scheduled for:  Wednesday December 18th at 2:00pm         Please refer to Care Plan for goals and interventions.  Patient educated on red flags s/s to watch for and was encouraged to report any of these identified, any new symptoms, changes in baseline or medication regimen, change in health status / well-being, or safety concerns to PCP and / or the [facility] Case Management team.   Routine follow-up and on-going assessment evaluation and education of disease processes, and recommended interventions for both chronic and acute medical conditions, will occur during  each weekly visit during [facility] 30-day Program Outreach calls along with ongoing review of symptoms, medication reviews and reconciliation. Any updates, inconsistencies, discrepancies or acute care concerns will be addressed on the Care Plan and routed to the correct Practitioner if indicated.    The patient has been provided with contact information for the care management team and has been advised to call with any health-related questions or concerns. The patient verbalized understanding with current POC. The patient is directed to their insurance card regarding availability of benefits coverage.     Deidre Ala, RN Medical illustrator [facility] [phone]

## 2023-07-20 NOTE — Telephone Encounter (Addendum)
with a BP that high he really needs to be seen in person to make sure there is not any end organ damage. he will need to restart some of his medications, though needs to be evaluated. with the BP being 210 systolic I would suggest ED evaluation for labs and to have a physician see him.  The above was relayed to the RN calling from [facility] of care through secure chat.

## 2023-07-20 NOTE — Telephone Encounter (Signed)
Message received back from [facility]  She contacted the patient and his blood pressure had improved to 160/74.  He refused to go to the ED.  Please contact the patient.  He does need to restart some of his blood pressure medication.  I have sent in amlodipine and valsartan for him to start on.  If he starts to feel lightheaded or if his blood pressure gets down to 100/60 or less he needs to contact us right away.  He scheduled an appointment with me for Friday it appears.

## 2023-07-20 NOTE — Addendum Note (Signed)
Addended by: [name] on: 07/20/2023 03:45 PM   Modules accepted: Orders

## 2023-07-20 NOTE — Patient Instructions (Signed)
Visit Information  Thank you for taking time to visit with me today. Please don't hesitate to contact me if I can be of assistance to you before our next scheduled telephone appointment.   Following is a copy of your care plan:   Goals Addressed             This Visit's Progress    TOC plan of care       Current Barriers:  Knowledge Deficits related to plan of care for management of HTN and Chronic Pain   RNCM Clinical Goal(s):  Patient will verbalize understanding of plan for management of HTN and Chronic Pain as evidenced by No re-admission to the hospital  through collaboration with RN Care manager, provider, and care team.   Interventions: Evaluation of current treatment plan related to  self management and patient's adherence to plan as established by provider   Hypertension Interventions:  (Status:  New goal.) Short Term Goal Last practice recorded BP readings:  BP Readings from Last 3 Encounters:  07/15/23 132/84  07/11/23 118/65  06/28/23 127/82   Most recent eGFR/CrCl: No results found for: "EGFR"  No components found for: "CRCL"  Evaluation of current treatment plan related to hypertension self management and patient's adherence to plan as established by provider Provided education to patient re: stroke prevention, s/s of heart attack and stroke Reviewed medications with patient and discussed importance of compliance Counseled on the importance of exercise goals with target of 150 minutes per week Discussed plans with patient for ongoing care management follow up and provided patient with direct contact information for care management team Advised patient, providing education and rationale, to monitor blood pressure daily and record, calling PCP for findings outside established parameters Screening for signs and symptoms of depression related to chronic disease state   Patient Goals/Self-Care Activities: Participate in Transition of Care Program/Attend [facility]  scheduled calls Notify RN Care Manager of TOC call rescheduling needs Take all medications as prescribed Attend all scheduled provider appointments Call pharmacy for medication refills 3-7 days in advance of running out of medications Call provider office for new concerns or questions   Follow Up Plan:  Telephone follow up appointment with care management team member scheduled for:  Wednesday December 18th at 2:00pm          The patient verbalized understanding of instructions, educational materials, and care plan provided today and agreed to receive a mailed copy of patient instructions, educational materials, and care plan.   Telephone follow up appointment with care management team member scheduled for: Wednesday December 18th at 2:00pm  Please call the care guide team at [phone] if you need to cancel or reschedule your appointment.   Please call the Suicide and Crisis Lifeline: 988 call the [place] National Suicide Prevention Lifeline: 613-687-9556 or TTY: 201-687-9817 TTY 6706758251) to talk to a trained counselor if you are experiencing a Mental Health or Behavioral Health Crisis or need someone to talk to.  Deidre Ala, RN Medical illustrator [facility] [phone]

## 2023-07-20 NOTE — Telephone Encounter (Signed)
[facility] called stating need to get in contact with provider as patients BP is 210/94 was taken twice and read the same. She would like a call back patient was admitted and all medications were discontinued. Please call [phone]

## 2023-07-20 NOTE — Telephone Encounter (Signed)
Spoke to Patient regarding his BP meds and he is aware to pick up the Amlodipine 5 mg and take 1 tablet daily and the Valsartan 80 mg take 1 tablet daily. Patient understands and is agreeable to call us if the BP gets down to 100/60 or less.

## 2023-07-22 ENCOUNTER — Ambulatory Visit: Payer: Medicare PPO | Admitting: Family Medicine

## 2023-07-22 ENCOUNTER — Encounter: Payer: Self-pay | Admitting: Family Medicine

## 2023-07-22 VITALS — BP 148/84 | HR 108 | Temp 97.4°F | Ht 66.0 in | Wt 163.6 lb

## 2023-07-22 DIAGNOSIS — R197 Diarrhea, unspecified: Secondary | ICD-10-CM

## 2023-07-22 DIAGNOSIS — I10 Essential (primary) hypertension: Secondary | ICD-10-CM | POA: Diagnosis not present

## 2023-07-22 HISTORY — DX: Diarrhea, unspecified: R19.7

## 2023-07-22 NOTE — Assessment & Plan Note (Signed)
Possibly related to dietary intake.  Patient was in the hospital recently which makes an infectious cause a possibility.  Discussed checking stool studies though patient thinks it may be dietary so we will see how he does through the weekend.  If he still having diarrhea on Monday he will contact us and we can check stool tests.  Patient will remain adequately hydrated.  Advised limiting use of Imodium and preferably not using this given the potential that this could be infectious.

## 2023-07-22 NOTE — Assessment & Plan Note (Signed)
Chronic issue.  Blood pressure is above goal.  Exacerbation of a chronic issue.  Patient will remain on valsartan 80 mg daily and amlodipine 5 mg daily.  Check lab work next week.  Follow-up with me in 2 weeks.

## 2023-07-22 NOTE — Progress Notes (Signed)
Marikay Alar, MD Phone: [phone]  Justin Patel is a 72 y.o. male who presents today for f/u.  HYPERTENSION Disease Monitoring Home BP Monitoring has been elevated though has come down some since restarting medication chest pain-no    dyspnea-no Medications Compliance-taking valsartan and amlodipine.  Edema-no BMET    Component Value Date/Time   NA 140 07/15/2023 1556   NA 142 02/15/2019 0932   NA 140 05/17/2014 0529   K 3.9 07/15/2023 1556   K 3.8 05/17/2014 0529   CL 103 07/15/2023 1556   CL 105 05/17/2014 0529   CO2 30 07/15/2023 1556   CO2 27 05/17/2014 0529   GLUCOSE 135 (H) 07/15/2023 1556   GLUCOSE 112 (H) 05/17/2014 0529   BUN 17 07/15/2023 1556   BUN 29 (H) 02/15/2019 0932   BUN 10 05/17/2014 0529   CREATININE 1.23 07/15/2023 1556   CALCIUM 8.6 07/15/2023 1556   CALCIUM 8.2 (L) 05/17/2014 0529   GFRNONAA 53 (L) 07/06/2023 0633   GFRNONAA >60 01/26/2023 1449   GFRNONAA >60 05/17/2014 0529   GFRNONAA >60 05/01/2014 0928   GFRAA >60 01/30/2020 0129   GFRAA >60 05/17/2014 0529   GFRAA >60 05/01/2014 0981   Diarrhea: Patient notes this started this morning.  He said 2 episodes of liquidy stool.  He notes no vomiting, abdominal pain, fevers, or blood in stool.  He attributes this to dietary intake yesterday with pecan cream pie and [place] soup.  Notes at times he can have diarrhea related to this kind of intake.  Patient reports urine is light yellow.  He reports good fluid intake.  Social History   Tobacco Use  Smoking Status Never  Smokeless Tobacco Never    Current Outpatient Medications on File Prior to Visit  Medication Sig Dispense Refill   acetaminophen (TYLENOL) 325 MG tablet Take 650 mg by mouth every 6 (six) hours as needed for moderate pain.     amLODipine (NORVASC) 5 MG tablet Take 1 tablet (5 mg total) by mouth daily. 90 tablet 1   Calcium Carbonate-Vitamin D (CALCIUM 600/VITAMIN D PO) Take 1 tablet by mouth daily.     Cholecalciferol  50 MCG (2000 UT) TABS Take 2,000 Units by mouth daily.     docusate sodium (COLACE) 100 MG capsule Take 1 capsule (100 mg total) by mouth daily as needed for mild constipation.     esomeprazole (NEXIUM) 40 MG capsule Take 1 capsule (40 mg total) by mouth 2 (two) times daily before a meal. 180 capsule 1   ezetimibe (ZETIA) 10 MG tablet Take 1 tablet (10 mg total) by mouth daily. 90 tablet 3   gabapentin (NEURONTIN) 300 MG capsule Take 1 capsule (300 mg total) by mouth 2 (two) times daily AND 2 capsules (600 mg total) daily with lunch. 120 capsule 0   HYDROcodone-acetaminophen (NORCO) 7.5-325 MG tablet Take 1-2 tablets by mouth every 4 (four) hours as needed for moderate pain (pain score 4-6) or severe pain (pain score 7-10). 30 tablet 0   linagliptin (TRADJENTA) 5 MG TABS tablet Take 1 tablet (5 mg total) by mouth daily. 90 tablet 1   meclizine (ANTIVERT) 25 MG tablet Take 1 tablet (25 mg total) by mouth 3 (three) times daily as needed for up to 14 days for dizziness. 42 tablet 0   methocarbamol 1000 MG TABS Take 1,000 mg by mouth 4 (four) times daily. 30 tablet 1   Multiple Vitamin (MULTIVITAMIN) capsule Take 1 capsule by mouth daily. With copper  and zinc (Patient taking differently: Take 1 capsule by mouth daily.)     ondansetron (ZOFRAN-ODT) 4 MG disintegrating tablet Allow 1-2 tablets to dissolve in your mouth every 8 hours as needed for nausea/vomiting 30 tablet 0   primidone (MYSOLINE) 50 MG tablet Take 1 tablet (50 mg total) by mouth at bedtime. 30 tablet 5   rosuvastatin (CRESTOR) 40 MG tablet Take 1 tablet (40 mg total) by mouth daily. 90 tablet 1   sucralfate (CARAFATE) 1 g tablet TAKE 1 TABLET BY MOUTH 4 TIMES DAILY 360 tablet 1   valsartan (DIOVAN) 80 MG tablet Take 1 tablet (80 mg total) by mouth daily. 90 tablet 1   venlafaxine XR (EFFEXOR-XR) 75 MG 24 hr capsule TAKE ONE CAPSULE EVERY MORNING WITH BREAKFAST 90 capsule 1   No current facility-administered medications on file prior to  visit.     ROS see history of present illness  Objective  Physical Exam Vitals:   07/22/23 1112 07/22/23 1125  BP: (!) 150/88 (!) 148/84  Pulse: (!) 115 (!) 108  Temp: (!) 97.4 F (36.3 C)   SpO2: 99%     BP Readings from Last 3 Encounters:  07/22/23 (!) 148/84  07/15/23 132/84  07/11/23 118/65   Wt Readings from Last 3 Encounters:  07/22/23 163 lb 9.6 oz (74.2 kg)  07/15/23 175 lb 9.6 oz (79.7 kg)  07/13/23 165 lb (74.8 kg)    Physical Exam Constitutional:      General: He is not in acute distress.    Appearance: He is not diaphoretic.  Cardiovascular:     Rate and Rhythm: Regular rhythm. Tachycardia present.     Heart sounds: Normal heart sounds.  Pulmonary:     Effort: Pulmonary effort is normal.     Breath sounds: Normal breath sounds.  Skin:    General: Skin is warm and dry.  Neurological:     Mental Status: He is alert.      Assessment/Plan: Please see individual problem list.  Essential hypertension, benign Assessment & Plan: Chronic issue.  Blood pressure is above goal.  Exacerbation of a chronic issue.  Patient will remain on valsartan 80 mg daily and amlodipine 5 mg daily.  Check lab work next week.  Follow-up with me in 2 weeks.  Orders: -     Basic metabolic panel; Future  Diarrhea, unspecified type Assessment & Plan: Possibly related to dietary intake.  Patient was in the hospital recently which makes an infectious cause a possibility.  Discussed checking stool studies though patient thinks it may be dietary so we will see how he does through the weekend.  If he still having diarrhea on Monday he will contact us and we can check stool tests.  Patient will remain adequately hydrated.  Advised limiting use of Imodium and preferably not using this given the potential that this could be infectious.     Return in about 1 week (around 07/29/2023) for Labs, 2 weeks PCP for blood pressure follow-up.   Marikay Alar, MD [facility]

## 2023-07-26 ENCOUNTER — Other Ambulatory Visit: Payer: Self-pay | Admitting: Family Medicine

## 2023-07-26 NOTE — Telephone Encounter (Signed)
Refilled though dose changed to a more appropriate dose given that he is still using this after his recent hospitalization.

## 2023-07-27 ENCOUNTER — Other Ambulatory Visit: Payer: Self-pay

## 2023-07-27 ENCOUNTER — Telehealth: Payer: Self-pay

## 2023-07-27 NOTE — Transitions of Care (Post Inpatient/ED Visit) (Signed)
   07/27/2023  Name: Justin Patel MRN: [id number] DOB: 22-May-1951  error

## 2023-07-29 ENCOUNTER — Other Ambulatory Visit (INDEPENDENT_AMBULATORY_CARE_PROVIDER_SITE_OTHER): Payer: Medicare PPO

## 2023-07-29 ENCOUNTER — Other Ambulatory Visit: Payer: Self-pay | Admitting: Family Medicine

## 2023-07-29 DIAGNOSIS — I10 Essential (primary) hypertension: Secondary | ICD-10-CM | POA: Diagnosis not present

## 2023-07-29 LAB — BASIC METABOLIC PANEL
BUN: 10 mg/dL (ref 6–23)
CO2: 20 meq/L (ref 19–32)
Calcium: 9.1 mg/dL (ref 8.4–10.5)
Chloride: 111 meq/L (ref 96–112)
Creatinine, Ser: 0.95 mg/dL (ref 0.40–1.50)
GFR: 79.99 mL/min (ref >60.00)
Glucose, Bld: 101 mg/dL — ABNORMAL HIGH (ref 70–99)
Potassium: 3.7 meq/L (ref 3.5–5.1)
Sodium: 140 meq/L (ref 135–145)

## 2023-08-01 DIAGNOSIS — F1120 Opioid dependence, uncomplicated: Secondary | ICD-10-CM | POA: Diagnosis not present

## 2023-08-01 DIAGNOSIS — M5416 Radiculopathy, lumbar region: Secondary | ICD-10-CM | POA: Diagnosis not present

## 2023-08-01 DIAGNOSIS — M546 Pain in thoracic spine: Secondary | ICD-10-CM | POA: Diagnosis not present

## 2023-08-01 DIAGNOSIS — M5414 Radiculopathy, thoracic region: Secondary | ICD-10-CM | POA: Diagnosis not present

## 2023-08-02 ENCOUNTER — Other Ambulatory Visit: Payer: Self-pay | Admitting: Family Medicine

## 2023-08-09 ENCOUNTER — Other Ambulatory Visit: Payer: Self-pay | Admitting: Family Medicine

## 2023-08-11 ENCOUNTER — Telehealth: Payer: Self-pay

## 2023-08-11 ENCOUNTER — Telehealth: Payer: Self-pay | Admitting: Family Medicine

## 2023-08-11 MED ORDER — TAMSULOSIN HCL 0.4 MG PO CAPS: 0.4000 mg | ORAL_CAPSULE | Freq: Every day | ORAL | 3 refills | Status: DC

## 2023-08-11 NOTE — Telephone Encounter (Signed)
 Duplicate

## 2023-08-11 NOTE — Telephone Encounter (Unsigned)
 Copied from CRM [id number]. Topic: Clinical - Medication Question >> Aug 11, 2023  2:05 PM Rolin D wrote: Reason for CRM: patient was taken off flomax  and states he isnt having much of a flow . Patient would like to know if he could be put back on the medication and take 1 pill a day instead of 2 pills a day

## 2023-08-11 NOTE — Telephone Encounter (Signed)
 Pt.notified

## 2023-08-11 NOTE — Addendum Note (Signed)
 Addended by: [name] on: 08/11/2023 02:55 PM   Modules accepted: Orders

## 2023-08-11 NOTE — Telephone Encounter (Signed)
 Copied from CRM [id number]. Topic: Clinical - Medication Question >> Aug 11, 2023  2:05 PM Rolin D wrote: Reason for CRM: patient was taken off flomax  and states he isnt having much of a flow . Patient would like to know if he could be put back on the medication and take 1 pill a day instead of 2 pills a day

## 2023-08-11 NOTE — Telephone Encounter (Signed)
 He can go back on flomax 0.4 mg daily. New prescription sent to pharmacy. He needs to monitor for low BPs and light headedness with this. If those occur he needs to let us know.

## 2023-08-18 ENCOUNTER — Ambulatory Visit: Payer: Medicare PPO | Attending: Medical | Admitting: Medical

## 2023-08-18 ENCOUNTER — Encounter: Payer: Self-pay | Admitting: Medical

## 2023-08-18 VITALS — BP 121/72 | HR 86 | Ht 66.0 in | Wt 166.4 lb

## 2023-08-18 DIAGNOSIS — S301XXD Contusion of abdominal wall, subsequent encounter: Secondary | ICD-10-CM

## 2023-08-18 DIAGNOSIS — R55 Syncope and collapse: Secondary | ICD-10-CM | POA: Diagnosis not present

## 2023-08-18 DIAGNOSIS — D649 Anemia, unspecified: Secondary | ICD-10-CM

## 2023-08-18 DIAGNOSIS — I10 Essential (primary) hypertension: Secondary | ICD-10-CM

## 2023-08-18 DIAGNOSIS — I5032 Chronic diastolic (congestive) heart failure: Secondary | ICD-10-CM

## 2023-08-18 DIAGNOSIS — I951 Orthostatic hypotension: Secondary | ICD-10-CM

## 2023-08-18 MED ORDER — VALSARTAN 80 MG PO TABS: 80.0000 mg | ORAL_TABLET | Freq: Every evening | ORAL | 3 refills | Status: DC

## 2023-08-18 NOTE — Progress Notes (Signed)
 Cardiology Office Note:    Date:  08/19/2023   ID:  Justin Patel, DOB May 24, 1951, MRN [id number]  PCP:  Maribeth Camellia MATSU, MD  [facility] Cardiologist:  Deatrice Cage, MD  [facility] Electrophysiologist:  None   Referring MD: Maribeth Camellia MATSU, MD   Chief Complaint: hospital follow-up  History of Present Illness:    Justin Patel is a 73 y.o. male with a hx of  chronic diastolic heart failure, morbid obesity s/p gastric bypass 2010, iron  deficiency anemia, diabetes mellitus, orthostatic hypotension, HTN, HLD, anxiety, prior alcohol abuse, degenerative disc disease with previous back surgery and GERD.    Echo in 2020 showed LVEF 55-60%, no valvular abnormalities.    The patient was seen in there ER 03/14/23 for syncope. Work-up showed K3.3, Scr 1.73. EKG with NSR and PACs. Hs trop negative. Vitals were normal. Patient was discharged home without intervention.   Patient was seen October 2024 after syncope episode.  Orthostatics were positive.  EKG showed normal sinus rhythm with PACs and PVCs.  Lasix , amlodipine  and Toprol  were stopped.  Heart monitor and echocardiogram ordered.  Echo showed EF 60 to 65%, mild LVH, grade 1 diastolic dysfunction.  Heart monitor showed predominantly normal sinus rhythm, 1 run of ventricular tachycardia, 62 runs of SVT, longest lasting 29 seconds, rare PACs, occasional PVCs with a burden of 1.7%.  Patient was last seen in November 2024.  He was back on his BP medications without any symptoms/issues.  The patient was admitted 06/2023 for 3 days for for fall from standing, right flank hematoma and ABL anemia. He presented 07/02/23 with a fall from standing after voiding. He turned and fell, hitting his right back on the side of the tub. He was found to have right flank hematoma. He was found to be profoundly orthostatic and given IVF and intermittently midodrine . MRI of the brain was negative. He was started on meclizine . PT recommended HH. He was  not sent home on midodrine .  Since the visit patient's BP was high and he was started on amlodipine  and Valsartan  for BP.   Today, the patient is here for hospital follow-up. He is overall feeling better but still has hematoma on the right side that is slowly improving. He is using his cane and being careful when he changes positions. Prior to his admission, when he fell he felt dizzy and lightheaded, similar to when BP dropped. He is staying hydrated. He feels better. He is on amlodipine  and valsartan . He is not taking midodrine . Orthostatics almost positive today.  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  removal scheduled to clear margins.   CHF (congestive heart failure) (HCC)    no longer after weight loss   Chronic kidney disease    cysts   Degenerative disc disease    Depression    Diabetes mellitus    no longer diabetic-or on meds   DJD (degenerative joint disease)    Dysplastic nevus 02/04/2016   R upper back paraspinal - severe   Family history of adverse reaction to anesthesia    sons wake up combative   Foot fracture, left 01/2022   Gastric ulcer    GERD (gastroesophageal reflux disease)    H/O hiatal hernia    Headache(784.0)    sinus  Heart murmur    History of fusion of thoracic spine 12/28/2021   History of gastric bypass    Hx of congestive heart failure    Hyperlipidemia    Hypertension    Iron  deficiency anemia 02/28/2015   Lumbar scoliosis    Opiate abuse, continuous (HCC) 06/20/2015   S/P laparoscopic cholecystectomy 02/18/2020   Sacral fracture, closed (HCC)    Seizures (HCC)    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, after GI bleed   Sleep apnea    hx not now since wt loss   Stones in the urinary tract    Syncope and collapse     Past Surgical History:  Procedure Laterality Date   ANTERIOR  CERVICAL DECOMP/DISCECTOMY FUSION  01/26/2012   Procedure: ANTERIOR CERVICAL DECOMPRESSION/DISCECTOMY FUSION 3 LEVELS;  Surgeon: Catalina CHRISTELLA Stains, MD;  Location: [facility];  Service: Neurosurgery;  Laterality: N/A;  Cervical three-four Cervical four-five Cervical five-six Cervical six-seven , Anterior cervical decompression/diskectomy, fusion, plate   ANTERIOR LAT LUMBAR FUSION Right 04/25/2018   Procedure: Right Lumbar two-three Lumbar three-four Lumbar four-five anterior  lateral interbody fusion  with posterior percutaneous pedicle screws;  Surgeon: Unice Pac, MD;  Location: [facility] OR;  Service: Neurosurgery;  Laterality: Right;   APPENDECTOMY     CARDIAC CATHETERIZATION     [facility], normal   CARDIAC CATHETERIZATION     [place]   CHOLECYSTECTOMY     COLONOSCOPY WITH PROPOFOL  N/A 12/27/2017   Procedure: COLONOSCOPY WITH PROPOFOL ;  Surgeon: Toledo, Ladell POUR, MD;  Location: [facility] ENDOSCOPY;  Service: Gastroenterology;  Laterality: N/A;   ESOPHAGOGASTRODUODENOSCOPY (EGD) WITH PROPOFOL  N/A 12/27/2017   Procedure: ESOPHAGOGASTRODUODENOSCOPY (EGD) WITH PROPOFOL ;  Surgeon: Toledo, Ladell POUR, MD;  Location: [facility] ENDOSCOPY;  Service: Gastroenterology;  Laterality: N/A;   ESOPHAGOGASTRODUODENOSCOPY (EGD) WITH PROPOFOL  N/A 06/19/2021   Procedure: ESOPHAGOGASTRODUODENOSCOPY (EGD) WITH PROPOFOL ;  Surgeon: Maryruth Ole DASEN, MD;  Location: [facility] ENDOSCOPY;  Service: Gastroenterology;  Laterality: N/A;   ESOPHAGOGASTRODUODENOSCOPY (EGD) WITH PROPOFOL  N/A 09/25/2021   Procedure: ESOPHAGOGASTRODUODENOSCOPY (EGD) WITH PROPOFOL ;  Surgeon: Maryruth Ole DASEN, MD;  Location: [facility] ENDOSCOPY;  Service: Endoscopy;  Laterality: N/A;   GASTRIC BYPASS  08/09/2008   [employer]   HARDWARE REMOVAL N/A 03/08/2022   Procedure: Removal of bilateral Thoracic ten pedicle screws;  Surgeon: Louis Shove, MD;  Location: [facility] OR;  Service: Neurosurgery;  Laterality: N/A;   HERNIA REPAIR  08/09/1998   hiatal    JOINT REPLACEMENT     KNEE ARTHROSCOPY     bilateral, left x 2   LAMINECTOMY WITH POSTERIOR LATERAL ARTHRODESIS LEVEL 4 N/A 01/14/2020   Procedure: Decompressive Laminectomy Lumbar One with pedicle screw fixation from Thoracic Ten to Lumbar Two;  Surgeon: Unice Pac, MD;  Location: [facility] OR;  Service: Neurosurgery;  Laterality: N/A;  Decompressive Laminectomy Lumbar One with pedicle screw fixation from Thoracic Ten to Lumbar Two   LUMBAR PERCUTANEOUS PEDICLE SCREW 3 LEVEL N/A 04/25/2018   Procedure: LUMBAR PERCUTANEOUS PEDICLE SCREW 3 LEVEL;  Surgeon: Unice Pac, MD;  Location: [facility] OR;  Service: Neurosurgery;  Laterality: N/A;   NASAL SINUS SURGERY     x5   OSTEOTOMY  08/10/1999   left   ROUX-EN-Y GASTRIC BYPASS     THORACIC LAMINECTOMY FOR EPIDURAL ABSCESS N/A 03/28/2022   Procedure: THORACIC WOUND WASHOUT;  Surgeon: Joshua Alm RAMAN, MD;  Location: [facility] OR;  Service: Neurosurgery;  Laterality: N/A;   TONSILLECTOMY     TOTAL KNEE ARTHROPLASTY Left 05/09/2014   Dr. Hooten  UVULOPALATOPLASTY  08/09/2009    Current Medications: Current Meds  Medication Sig   acetaminophen  (TYLENOL ) 325 MG tablet Take 650 mg by mouth every 6 (six) hours as needed for moderate pain.   amLODipine  (NORVASC ) 5 MG tablet Take 1 tablet (5 mg total) by mouth daily.   Calcium  Carbonate-Vitamin D  (CALCIUM  600/VITAMIN D  PO) Take 1 tablet by mouth daily.   Cholecalciferol  50 MCG (2000 UT) TABS Take 2,000 Units by mouth daily.   docusate sodium  (COLACE) 100 MG capsule Take 1 capsule (100 mg total) by mouth daily as needed for mild constipation.   esomeprazole  (NEXIUM ) 40 MG capsule TAKE 1 CAPSULE BY MOUTH 2 TIMES DAILY BEFORE MEALS.   ezetimibe  (ZETIA ) 10 MG tablet Take 1 tablet (10 mg total) by mouth daily.   HYDROcodone -acetaminophen  (NORCO) 7.5-325 MG tablet Take 1-2 tablets by mouth every 4 (four) hours as needed for moderate pain (pain score 4-6) or severe pain (pain score 7-10).   methocarbamol  (ROBAXIN ) 750 MG  tablet TAKE ONE TABLET BY MOUTH EVERY EIGHT HOURS AS NEEDED FOR SPASM   Multiple Vitamin (MULTIVITAMIN) capsule Take 1 capsule by mouth daily. With copper  and zinc  (Patient taking differently: Take 1 capsule by mouth daily.)   ondansetron  (ZOFRAN -ODT) 4 MG disintegrating tablet Allow 1-2 tablets to dissolve in your mouth every 8 hours as needed for nausea/vomiting   primidone  (MYSOLINE ) 50 MG tablet Take 1 tablet (50 mg total) by mouth at bedtime.   rosuvastatin  (CRESTOR ) 40 MG tablet Take 1 tablet (40 mg total) by mouth daily.   sucralfate  (CARAFATE ) 1 g tablet TAKE 1 TABLET BY MOUTH 4 TIMES DAILY   tamsulosin  (FLOMAX ) 0.4 MG CAPS capsule Take 1 capsule (0.4 mg total) by mouth daily.   TRADJENTA  5 MG TABS tablet TAKE 1 TABLET BY MOUTH DAILY   venlafaxine  XR (EFFEXOR -XR) 75 MG 24 hr capsule TAKE ONE CAPSULE EVERY MORNING WITH BREAKFAST   [DISCONTINUED] valsartan  (DIOVAN ) 80 MG tablet Take 1 tablet (80 mg total) by mouth daily.     Allergies:   Altace [ramipril], Ace inhibitors, Levaquin [levofloxacin in d5w], Lyrica [pregabalin], Metformin , and Trulicity  [dulaglutide ]   Social History   Socioeconomic History   Marital status: Married    Spouse name: Not on file   Number of children: 2   Years of education: Not on file   Highest education level: Not on file  Occupational History   Not on file  Tobacco Use   Smoking status: Never   Smokeless tobacco: Never  Vaping Use   Vaping status: Never Used  Substance and Sexual Activity   Alcohol use: Yes    Alcohol/week: 21.0 standard drinks of alcohol    Types: 21 Glasses of wine per week    Comment: occassionally   Drug use: No   Sexual activity: Yes    Partners: Female  Other Topics Concern   Not on file  Social History Narrative   Lives in [place] with wife, [place]. 2 sons, Beverley FOLKS, Franky 30.. 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      Diet - regular diet, limited quantities  after gastric bypass      Exercise - no regular, limited by arthritis in knees, occasional water aerobics   Right handed   One story house   Social Drivers of Health   Financial Resource Strain: Low Risk  (07/13/2023)   Overall Financial Resource Strain (CARDIA)    Difficulty of  Paying Living Expenses: Not hard at all  Food Insecurity: No Food Insecurity (07/20/2023)   Hunger Vital Sign    Worried About Running Out of Food in the Last Year: Never true    Ran Out of Food in the Last Year: Never true  Transportation Needs: No Transportation Needs (07/20/2023)   PRAPARE - Administrator, Civil Service (Medical): No    Lack of Transportation (Non-Medical): No  Physical Activity: Inactive (07/13/2023)   Exercise Vital Sign    Days of Exercise per Week: 0 days    Minutes of Exercise per Session: 0 min  Stress: No Stress Concern Present (07/13/2023)   [employer] of Occupational Health - Occupational Stress Questionnaire    Feeling of Stress : Only a little  Social Connections: Socially Integrated (07/13/2023)   Social Connection and Isolation Panel [NHANES]    Frequency of Communication with Friends and Family: Twice a week    Frequency of Social Gatherings with Friends and Family: More than three times a week    Attends Religious Services: More than 4 times per year    Active Member of [employer] or Organizations: Yes    Attends Engineer, Structural: More than 4 times per year    Marital Status: Married     Family History: The patient's family history includes Alcoholism in his maternal uncle; Dementia (age of onset: 21) in his mother; Diabetes in his father; Heart disease (age of onset: 47) in his father; Lung cancer in his paternal aunt, paternal aunt, paternal aunt, and paternal aunt; Lupus in his sister; Lymphoma in his sister; Mesothelioma in his cousin; Osteoporosis in his mother; Ovarian cancer (age of onset: 75) in his sister; Prostate cancer in his maternal  uncle; Skin cancer in his maternal uncle; Tongue cancer in his maternal uncle.  ROS:   Please see the history of present illness.     All other systems reviewed and are negative.  EKGs/Labs/Other Studies Reviewed:    The following studies were reviewed today:  Heart monitor 05/2023 Patch Wear Time:  13 days and 21 hours (2024-10-05T11:21:07-399 to 2024-10-19T09:01:40-0400)   Patient had a min HR of 47 bpm, max HR of 214 bpm, and avg HR of 73 bpm. Predominant underlying rhythm was Sinus Rhythm. 1 run of Ventricular Tachycardia occurred lasting 4 beats with a max rate of 194 bpm (avg 169 bpm). 62 Supraventricular Tachycardia  runs occurred, the run with the fastest interval lasting 29.9 secs with a max rate of 214 bpm (avg 166 bpm); the run with the fastest interval was also the longest. Isolated SVEs were rare (<1.0%), SVE Couplets were rare (<1.0%), and SVE Triplets were  rare (<1.0%). Isolated VEs were occasional (1.7%, 24606), VE Couplets were rare (<1.0%, 122), and VE Triplets were rare (<1.0%, 5). Ventricular Bigeminy and Trigeminy were present.    Echo 05/2023 1. Left ventricular ejection fraction, by estimation, is 60 to 65%. The  left ventricle has normal function. The left ventricle has no regional  wall motion abnormalities. There is mild left ventricular hypertrophy.  Left ventricular diastolic parameters  are consistent with Grade I diastolic dysfunction (impaired relaxation).  The average left ventricular global longitudinal strain is -18.7 %. The  global longitudinal strain is normal.   2. Right ventricular systolic function is normal. The right ventricular  size is normal.   3. The mitral valve is normal in structure. No evidence of mitral valve  regurgitation.   4. The aortic valve is tricuspid.  Aortic valve regurgitation is not  visualized.     EKG:  EKG is not ordered today.    Recent Labs: 05/11/2023: TSH 2.040 07/02/2023: ALT 81 07/06/2023: Magnesium   1.8 07/15/2023: Hemoglobin 11.0; Platelets 350 07/29/2023: BUN 10; Creatinine, Ser 0.95; Potassium 3.7; Sodium 140  Recent Lipid Panel    Component Value Date/Time   CHOL 144 08/11/2022 1415   CHOL 171 03/14/2019 0000   TRIG 81.0 08/11/2022 1415   HDL 64.20 08/11/2022 1415   HDL 64 03/14/2019 0000   CHOLHDL 2 08/11/2022 1415   VLDL 16.2 08/11/2022 1415   LDLCALC 64 08/11/2022 1415   LDLCALC 77 03/14/2019 0000   LDLDIRECT 70.0 05/27/2021 0948     Physical Exam:    VS:  BP 121/72   Pulse 86   Ht 5' 6 (1.676 m)   Wt 166 lb 6.4 oz (75.5 kg)   SpO2 97%   BMI 26.86 kg/m     Wt Readings from Last 3 Encounters:  08/18/23 166 lb 6.4 oz (75.5 kg)  07/22/23 163 lb 9.6 oz (74.2 kg)  07/15/23 175 lb 9.6 oz (79.7 kg)     GEN:  Well nourished, well developed in no acute distress HEENT: Normal NECK: No JVD; No carotid bruits LYMPHATICS: No lymphadenopathy CARDIAC: RRR, no murmurs, rubs, gallops RESPIRATORY:  Clear to auscultation without rales, wheezing or rhonchi  ABDOMEN: Soft, non-tender, non-distended MUSCULOSKELETAL:  No edema; No deformity  SKIN: Warm and dry NEUROLOGIC:  Alert and oriented x 3 PSYCHIATRIC:  Normal affect   ASSESSMENT:    1. Syncope, unspecified syncope type   2. Primary hypertension   3. Orthostatic hypotension   4. Hematoma of right flank, subsequent encounter   5. Anemia, unspecified type   6. Chronic diastolic heart failure (HCC)    PLAN:    In order of problems listed above:  Syncope Orthostatic hypotension HTN History of syncope felt to be secondary to orthostatic hypotension. He had recurrent syncope leading to an admission found to have orthostatic hypotension, acute anemia requiring transfusion and a large right flank hematoma (from fall). BP meds were stopped and he required IVF and midodrine  in the hospital. He was not discharged on midodrine . Patient noted elevated blood pressures and he was restarted on amlodipine  5mg  daily and  valsartan  80mg  daily by PCP. He uses a cane at home and is extremely careful when he changes positions. Prior syncope work-up included heart monitor and an echocardiogram. Heart monitor showed NSR with 1 run of NSVT 4 beats, and 62 runs of SVT longest lasting 29.9 seconds, rare PACs and occasional PVCs 1.7%. Echo showed LVEF 60-65%, mild LVH, G1DD. Imaging of the head unremarkable. Orthostatics are almost positive today. I recommended he take amlodipine  in the AM and Valsartan  in the pm.   Right flank hematoma from fall Acute anemia Hgb down to 7.8 in the hospital requiring transfusion. Most recent Hgb 11.0. he is not on blood thinners at baseline.   Chronic diastolic heart failure The patient is euvolemic on exam. Most recent echo showed normal LVEF with G1DD.   Disposition: Follow up in 3 month(s) with MD/APP    Signed, Patrici Minnis VEAR Fishman, PA-C  08/19/2023 12:36 PM    [facility]

## 2023-08-18 NOTE — Patient Instructions (Signed)
 Medication Instructions:  Your physician recommends the following medication changes.  START TAKING: Valsartan  80 mg NIGHTLY  *If you need a refill on your cardiac medications before your next appointment, please call your pharmacy*   Lab Work: None ordered at this time  If you have labs (blood work) drawn today and your tests are completely normal, you will receive your results only by: MyChart Message (if you have MyChart) OR A paper copy in the mail If you have any lab test that is abnormal or we need to change your treatment, we will call you to review the results.   Follow-Up: At [facility], you and your health needs are our priority.  As part of our continuing mission to provide you with exceptional heart care, we have created designated Provider Care Teams.  These Care Teams include your primary Cardiologist (physician) and Advanced Practice Providers (APPs -  Physician Assistants and Nurse Practitioners) who all work together to provide you with the care you need, when you need it.  Your next appointment:   3 month(s)  Provider:   You may see Deatrice Cage, MD or one of the following Advanced Practice Providers on your designated Care Team:   Lonni Meager, NP Bernardino Bring, PA-C Cadence Franchester, PA-C Tylene Lunch, NP Barnie Hila, NP

## 2023-08-22 DIAGNOSIS — E119 Type 2 diabetes mellitus without complications: Secondary | ICD-10-CM | POA: Diagnosis not present

## 2023-08-22 DIAGNOSIS — H353131 Nonexudative age-related macular degeneration, bilateral, early dry stage: Secondary | ICD-10-CM | POA: Diagnosis not present

## 2023-08-22 DIAGNOSIS — H2513 Age-related nuclear cataract, bilateral: Secondary | ICD-10-CM | POA: Diagnosis not present

## 2023-08-22 LAB — HM DIABETES EYE EXAM

## 2023-09-06 ENCOUNTER — Inpatient Hospital Stay (HOSPITAL_COMMUNITY)
Admission: EM | Admit: 2023-09-06 | Discharge: 2023-09-09 | DRG: 918 | Disposition: A | Discharge status: Home Health | Payer: Medicare PPO | Attending: Internal Medicine | Admitting: Internal Medicine

## 2023-09-06 ENCOUNTER — Emergency Department (HOSPITAL_COMMUNITY): Payer: Medicare PPO

## 2023-09-06 ENCOUNTER — Encounter (HOSPITAL_COMMUNITY): Payer: Self-pay

## 2023-09-06 ENCOUNTER — Other Ambulatory Visit: Payer: Self-pay

## 2023-09-06 DIAGNOSIS — Z801 Family history of malignant neoplasm of trachea, bronchus and lung: Secondary | ICD-10-CM

## 2023-09-06 DIAGNOSIS — Z9049 Acquired absence of other specified parts of digestive tract: Secondary | ICD-10-CM

## 2023-09-06 DIAGNOSIS — I10 Essential (primary) hypertension: Secondary | ICD-10-CM | POA: Diagnosis present

## 2023-09-06 DIAGNOSIS — M549 Dorsalgia, unspecified: Secondary | ICD-10-CM | POA: Diagnosis present

## 2023-09-06 DIAGNOSIS — Z8269 Family history of other diseases of the musculoskeletal system and connective tissue: Secondary | ICD-10-CM

## 2023-09-06 DIAGNOSIS — N281 Cyst of kidney, acquired: Secondary | ICD-10-CM | POA: Diagnosis not present

## 2023-09-06 DIAGNOSIS — I951 Orthostatic hypotension: Secondary | ICD-10-CM | POA: Diagnosis present

## 2023-09-06 DIAGNOSIS — M1990 Unspecified osteoarthritis, unspecified site: Secondary | ICD-10-CM | POA: Diagnosis present

## 2023-09-06 DIAGNOSIS — I5032 Chronic diastolic (congestive) heart failure: Secondary | ICD-10-CM | POA: Diagnosis present

## 2023-09-06 DIAGNOSIS — E1122 Type 2 diabetes mellitus with diabetic chronic kidney disease: Secondary | ICD-10-CM | POA: Diagnosis present

## 2023-09-06 DIAGNOSIS — S0990XA Unspecified injury of head, initial encounter: Secondary | ICD-10-CM | POA: Diagnosis not present

## 2023-09-06 DIAGNOSIS — E869 Volume depletion, unspecified: Secondary | ICD-10-CM | POA: Diagnosis present

## 2023-09-06 DIAGNOSIS — Z8249 Family history of ischemic heart disease and other diseases of the circulatory system: Secondary | ICD-10-CM | POA: Diagnosis not present

## 2023-09-06 DIAGNOSIS — E119 Type 2 diabetes mellitus without complications: Secondary | ICD-10-CM

## 2023-09-06 DIAGNOSIS — R110 Nausea: Secondary | ICD-10-CM | POA: Diagnosis not present

## 2023-09-06 DIAGNOSIS — F32A Depression, unspecified: Secondary | ICD-10-CM | POA: Diagnosis present

## 2023-09-06 DIAGNOSIS — R42 Dizziness and giddiness: Secondary | ICD-10-CM | POA: Diagnosis not present

## 2023-09-06 DIAGNOSIS — Z981 Arthrodesis status: Secondary | ICD-10-CM

## 2023-09-06 DIAGNOSIS — R7989 Other specified abnormal findings of blood chemistry: Secondary | ICD-10-CM

## 2023-09-06 DIAGNOSIS — Z808 Family history of malignant neoplasm of other organs or systems: Secondary | ICD-10-CM

## 2023-09-06 DIAGNOSIS — Z1152 Encounter for screening for COVID-19: Secondary | ICD-10-CM

## 2023-09-06 DIAGNOSIS — H6690 Otitis media, unspecified, unspecified ear: Secondary | ICD-10-CM

## 2023-09-06 DIAGNOSIS — R55 Syncope and collapse: Principal | ICD-10-CM | POA: Diagnosis present

## 2023-09-06 DIAGNOSIS — Z8711 Personal history of peptic ulcer disease: Secondary | ICD-10-CM

## 2023-09-06 DIAGNOSIS — Z8042 Family history of malignant neoplasm of prostate: Secondary | ICD-10-CM

## 2023-09-06 DIAGNOSIS — Z8262 Family history of osteoporosis: Secondary | ICD-10-CM

## 2023-09-06 DIAGNOSIS — E872 Acidosis, unspecified: Secondary | ICD-10-CM | POA: Diagnosis present

## 2023-09-06 DIAGNOSIS — F419 Anxiety disorder, unspecified: Secondary | ICD-10-CM | POA: Diagnosis present

## 2023-09-06 DIAGNOSIS — Z8041 Family history of malignant neoplasm of ovary: Secondary | ICD-10-CM

## 2023-09-06 DIAGNOSIS — E785 Hyperlipidemia, unspecified: Secondary | ICD-10-CM | POA: Diagnosis present

## 2023-09-06 DIAGNOSIS — I959 Hypotension, unspecified: Secondary | ICD-10-CM | POA: Diagnosis not present

## 2023-09-06 DIAGNOSIS — N1831 Chronic kidney disease, stage 3a: Secondary | ICD-10-CM | POA: Diagnosis present

## 2023-09-06 DIAGNOSIS — T391X1A Poisoning by 4-Aminophenol derivatives, accidental (unintentional), initial encounter: Principal | ICD-10-CM

## 2023-09-06 DIAGNOSIS — R7401 Elevation of levels of liver transaminase levels: Secondary | ICD-10-CM | POA: Insufficient documentation

## 2023-09-06 DIAGNOSIS — H6692 Otitis media, unspecified, left ear: Secondary | ICD-10-CM | POA: Diagnosis present

## 2023-09-06 DIAGNOSIS — Z0389 Encounter for observation for other suspected diseases and conditions ruled out: Secondary | ICD-10-CM | POA: Diagnosis not present

## 2023-09-06 DIAGNOSIS — Z79899 Other long term (current) drug therapy: Secondary | ICD-10-CM

## 2023-09-06 DIAGNOSIS — K219 Gastro-esophageal reflux disease without esophagitis: Secondary | ICD-10-CM | POA: Diagnosis present

## 2023-09-06 DIAGNOSIS — Z833 Family history of diabetes mellitus: Secondary | ICD-10-CM

## 2023-09-06 DIAGNOSIS — Z9181 History of falling: Secondary | ICD-10-CM

## 2023-09-06 DIAGNOSIS — Z888 Allergy status to other drugs, medicaments and biological substances status: Secondary | ICD-10-CM

## 2023-09-06 DIAGNOSIS — K719 Toxic liver disease, unspecified: Secondary | ICD-10-CM | POA: Diagnosis present

## 2023-09-06 DIAGNOSIS — Z96652 Presence of left artificial knee joint: Secondary | ICD-10-CM | POA: Diagnosis present

## 2023-09-06 DIAGNOSIS — K838 Other specified diseases of biliary tract: Secondary | ICD-10-CM | POA: Diagnosis not present

## 2023-09-06 DIAGNOSIS — S2242XA Multiple fractures of ribs, left side, initial encounter for closed fracture: Secondary | ICD-10-CM | POA: Diagnosis not present

## 2023-09-06 DIAGNOSIS — N400 Enlarged prostate without lower urinary tract symptoms: Secondary | ICD-10-CM | POA: Diagnosis present

## 2023-09-06 DIAGNOSIS — Z881 Allergy status to other antibiotic agents status: Secondary | ICD-10-CM

## 2023-09-06 DIAGNOSIS — Z9884 Bariatric surgery status: Secondary | ICD-10-CM

## 2023-09-06 DIAGNOSIS — Z7984 Long term (current) use of oral hypoglycemic drugs: Secondary | ICD-10-CM

## 2023-09-06 DIAGNOSIS — I130 Hypertensive heart and chronic kidney disease with heart failure and stage 1 through stage 4 chronic kidney disease, or unspecified chronic kidney disease: Secondary | ICD-10-CM | POA: Diagnosis present

## 2023-09-06 DIAGNOSIS — N179 Acute kidney failure, unspecified: Secondary | ICD-10-CM | POA: Diagnosis present

## 2023-09-06 DIAGNOSIS — G894 Chronic pain syndrome: Secondary | ICD-10-CM | POA: Diagnosis present

## 2023-09-06 DIAGNOSIS — M419 Scoliosis, unspecified: Secondary | ICD-10-CM | POA: Diagnosis present

## 2023-09-06 DIAGNOSIS — D689 Coagulation defect, unspecified: Secondary | ICD-10-CM | POA: Diagnosis present

## 2023-09-06 DIAGNOSIS — E876 Hypokalemia: Secondary | ICD-10-CM | POA: Diagnosis not present

## 2023-09-06 DIAGNOSIS — Z807 Family history of other malignant neoplasms of lymphoid, hematopoietic and related tissues: Secondary | ICD-10-CM

## 2023-09-06 DIAGNOSIS — N138 Other obstructive and reflux uropathy: Secondary | ICD-10-CM | POA: Diagnosis present

## 2023-09-06 DIAGNOSIS — E663 Overweight: Secondary | ICD-10-CM | POA: Diagnosis present

## 2023-09-06 DIAGNOSIS — R296 Repeated falls: Secondary | ICD-10-CM | POA: Diagnosis present

## 2023-09-06 LAB — CBC WITH DIFFERENTIAL/PLATELET
Abs Immature Granulocytes: 0.08 10*3/uL — ABNORMAL HIGH (ref 0.00–0.07)
Basophils Absolute: 0 10*3/uL (ref 0.0–0.1)
Basophils Relative: 0 %
Eosinophils Absolute: 0 10*3/uL (ref 0.0–0.5)
Eosinophils Relative: 0 %
HCT: 40 % (ref 39.0–52.0)
Hemoglobin: 12.8 g/dL — ABNORMAL LOW (ref 13.0–17.0)
Immature Granulocytes: 1 %
Lymphocytes Relative: 7 %
Lymphs Abs: 0.6 10*3/uL — ABNORMAL LOW (ref 0.7–4.0)
MCH: 35.9 pg — ABNORMAL HIGH (ref 26.0–34.0)
MCHC: 32 g/dL (ref 30.0–36.0)
MCV: 112 fL — ABNORMAL HIGH (ref 80.0–100.0)
Monocytes Absolute: 0.4 10*3/uL (ref 0.1–1.0)
Monocytes Relative: 4 %
Neutro Abs: 8 10*3/uL — ABNORMAL HIGH (ref 1.7–7.7)
Neutrophils Relative %: 88 %
Platelets: 178 10*3/uL (ref 150–400)
RBC: 3.57 MIL/uL — ABNORMAL LOW (ref 4.22–5.81)
RDW: 15.8 % — ABNORMAL HIGH (ref 11.5–15.5)
Smear Review: NORMAL
WBC: 9.1 10*3/uL (ref 4.0–10.5)
nRBC: 0 % (ref 0.0–0.2)

## 2023-09-06 LAB — COMPREHENSIVE METABOLIC PANEL
ALT: 896 U/L — ABNORMAL HIGH (ref 0–44)
AST: 700 U/L — ABNORMAL HIGH (ref 15–41)
Albumin: 2.6 g/dL — ABNORMAL LOW (ref 3.5–5.0)
Alkaline Phosphatase: 69 U/L (ref 38–126)
BUN: 20 mg/dL (ref 8–23)
CO2: <7 mmol/L — ABNORMAL LOW (ref 22–32)
Calcium: 8.3 mg/dL — ABNORMAL LOW (ref 8.9–10.3)
Chloride: 110 mmol/L (ref 98–111)
Creatinine, Ser: 2.31 mg/dL — ABNORMAL HIGH (ref 0.61–1.24)
GFR, Estimated: 29 mL/min — ABNORMAL LOW (ref >60)
Glucose, Bld: 127 mg/dL — ABNORMAL HIGH (ref 70–99)
Potassium: 3.9 mmol/L (ref 3.5–5.1)
Sodium: 137 mmol/L (ref 135–145)
Total Bilirubin: 0.8 mg/dL (ref 0.0–1.2)
Total Protein: 5.2 g/dL — ABNORMAL LOW (ref 6.5–8.1)

## 2023-09-06 LAB — PROTIME-INR
INR: 1.7 — ABNORMAL HIGH (ref 0.8–1.2)
Prothrombin Time: 20.4 s — ABNORMAL HIGH (ref 11.4–15.2)

## 2023-09-06 LAB — I-STAT CG4 LACTIC ACID, ED: Lactic Acid, Venous: 2.3 mmol/L (ref 0.5–1.9)

## 2023-09-06 LAB — APTT: aPTT: 41 s — ABNORMAL HIGH (ref 24–36)

## 2023-09-06 LAB — POC OCCULT BLOOD, ED: Fecal Occult Bld: NEGATIVE

## 2023-09-06 MED ORDER — SODIUM CHLORIDE 0.9 % IV SOLN
2.0000 g | Freq: Once | INTRAVENOUS | Status: AC
  Administered 2023-09-06: 2 g via INTRAVENOUS
  Filled 2023-09-06: qty 12.5

## 2023-09-06 MED ORDER — VANCOMYCIN HCL 1500 MG/300ML IV SOLN
1500.0000 mg | Freq: Once | INTRAVENOUS | Status: AC
  Administered 2023-09-07: 1500 mg via INTRAVENOUS
  Filled 2023-09-06: qty 300

## 2023-09-06 MED ORDER — LACTATED RINGERS IV BOLUS (SEPSIS)
1000.0000 mL | Freq: Once | INTRAVENOUS | Status: AC
Start: 2023-09-06 — End: 2023-09-06
  Administered 2023-09-06: 1000 mL via INTRAVENOUS

## 2023-09-06 MED ORDER — LACTATED RINGERS IV SOLN: INTRAVENOUS | Status: AC

## 2023-09-06 NOTE — Sepsis Progress Note (Signed)
Elink monitoring for the code sepsis protocol.

## 2023-09-06 NOTE — ED Provider Notes (Signed)
[facility] Emergency Department Provider Note MRN:  [id number]  Arrival date & time: 09/07/23     Chief Complaint   Loss of Consciousness and Hypotension   History of Present Illness   Justin Patel is a 73 y.o. year-old male presents to the ED with chief complaint of hypotension, multiple syncopal episodes over the past couple of days, and reported confusion.  Per EMS, the patient has passed out several times over the past few days.  Patient denies passing out.  He initially thought it was the 1930s.  On reassessment, he was able to accurately recall the date.  He states that he feels very fatigued.  He denies fevers, chills, cough, nausea, vomiting, diarrhea.  Denies any chest pain or abdominal pain.  Denies any pain or injuries.  He denies any dark or bloody stools.  States that he has had both hypertension and hypotension in the past.  He denies any recent medication changes.  Family reports had several falls over the past few days.  History provided by patient.   Review of Systems  Pertinent positive and negative review of systems noted in HPI.    Physical Exam   Vitals:   09/07/23 0345 09/07/23 0400  BP: 123/67 113/64  Pulse: 86 83  Resp: 14 19  Temp:    SpO2: 100% 100%    CONSTITUTIONAL:  pale-appearing, NAD NEURO:  Alert and oriented x 3, CN 3-12 grossly intact EYES:  eyes equal and reactive ENT/NECK:  Supple, no stridor  CARDIO:  normal rate, regular rhythm, appears well-perfused  PULM:  No respiratory distress, CTAB GI/GU:  non-distended, no focal abdominal tenderness, no gross blood on rectal exam MSK/SPINE:  No gross deformities, no edema, moves all extremities  SKIN:  no rash, atraumatic   *Additional and/or pertinent findings included in MDM below  Diagnostic and Interventional Summary    EKG Interpretation Date/Time:  Wednesday September 07 2023 00:37:10 EST Ventricular Rate:  89 PR Interval:  144 QRS Duration:  83 QT  Interval:  382 QTC Calculation: 465 R Axis:   14  Text Interpretation: Sinus rhythm Atrial premature complexes Low voltage, extremity leads When compared with ECG of 07/02/2023, Premature atrial complexes are now present Confirmed by Dione Booze ([id number]) on 09/07/2023 1:26:11 AM       Labs Reviewed  CBC WITH DIFFERENTIAL/PLATELET - Abnormal; Notable for the following components:      Result Value   RBC 3.57 (*)    Hemoglobin 12.8 (*)    MCV 112.0 (*)    MCH 35.9 (*)    RDW 15.8 (*)    Neutro Abs 8.0 (*)    Lymphs Abs 0.6 (*)    Abs Immature Granulocytes 0.08 (*)    All other components within normal limits  PROTIME-INR - Abnormal; Notable for the following components:   Prothrombin Time 20.4 (*)    INR 1.7 (*)    All other components within normal limits  APTT - Abnormal; Notable for the following components:   aPTT 41 (*)    All other components within normal limits  URINALYSIS, W/ REFLEX TO CULTURE (INFECTION SUSPECTED) - Abnormal; Notable for the following components:   Specific Gravity, Urine 1.032 (*)    Ketones, ur 5 (*)    Protein, ur 30 (*)    All other components within normal limits  COMPREHENSIVE METABOLIC PANEL - Abnormal; Notable for the following components:   CO2 <7 (*)    Glucose, Bld 127 (*)  Creatinine, Ser 2.31 (*)    Calcium 8.3 (*)    Total Protein 5.2 (*)    Albumin 2.6 (*)    AST 700 (*)    ALT 896 (*)    GFR, Estimated 29 (*)    All other components within normal limits  ACETAMINOPHEN LEVEL - Abnormal; Notable for the following components:   Acetaminophen (Tylenol), Serum 152 (*)    All other components within normal limits  I-STAT CG4 LACTIC ACID, ED - Abnormal; Notable for the following components:   Lactic Acid, Venous 2.3 (*)    All other components within normal limits  I-STAT VENOUS BLOOD GAS, ED - Abnormal; Notable for the following components:   pH, Ven 7.149 (*)    pCO2, Ven 19.0 (*)    pO2, Ven 94 (*)    Bicarbonate 6.6 (*)     TCO2 7 (*)    Acid-base deficit 20.0 (*)    HCT 35.0 (*)    Hemoglobin 11.9 (*)    All other components within normal limits  TROPONIN I (HIGH SENSITIVITY) - Abnormal; Notable for the following components:   Troponin I (High Sensitivity) 23 (*)    All other components within normal limits  TROPONIN I (HIGH SENSITIVITY) - Abnormal; Notable for the following components:   Troponin I (High Sensitivity) 26 (*)    All other components within normal limits  RESP PANEL BY RT-PCR (RSV, FLU A&B, COVID)  RVPGX2  CULTURE, BLOOD (ROUTINE X 2)  CULTURE, BLOOD (ROUTINE X 2)  ETHANOL  CK  CBC  COMPREHENSIVE METABOLIC PANEL  MAGNESIUM  PHOSPHORUS  BLOOD GAS, VENOUS  PROTIME-INR  SALICYLATE LEVEL  ACETAMINOPHEN LEVEL  POC OCCULT BLOOD, ED  I-STAT CG4 LACTIC ACID, ED    US Abdomen Limited RUQ (LIVER/GB)  Final Result    CT HEAD WO CONTRAST ( )  Final Result    DG Chest Port 1 View  Final Result      Medications  lactated ringers infusion (0 mLs Intravenous Hold 09/07/23 0412)  acetylcysteine (ACETADOTE) 30.5 mg/mL load via infusion 10,545 mg (10,545 mg Intravenous Bolus from Bag 09/07/23 0419)    Followed by  acetylcysteine (ACETADOTE) 18,000 mg in dextrose 5 % 590 mL (30.5085 mg/mL) infusion (15 mg/kg/hr  70.3 kg Intravenous New Bag/Given 09/07/23 0419)  lidocaine (LIDODERM) 5 % 1 patch (1 patch Transdermal Patch Applied 09/07/23 0334)  prochlorperazine (COMPAZINE) injection 5 mg (has no administration in time range)  polyethylene glycol (MIRALAX / GLYCOLAX) packet 17 g (has no administration in time range)  fomepizole (ANTIZOL) 1,050 mg in sodium chloride 0.9 % 100 mL IVPB (1,050 mg Intravenous New Bag/Given 09/07/23 0426)  lactated ringers bolus 1,000 mL (0 mLs Intravenous Stopped 09/06/23 2352)  ceFEPIme (MAXIPIME) 2 g in sodium chloride 0.9 % 100 mL IVPB (0 g Intravenous Stopped 09/07/23 0102)  vancomycin (VANCOREADY) IVPB 1500 mg/300 mL (0 mg Intravenous Stopped 09/07/23 0314)  sodium  bicarbonate injection 100 mEq (100 mEq Intravenous Given 09/07/23 0334)     Procedures  /  Critical Care .Critical Care  Performed by: Roxy Horseman, PA-C Authorized by: Roxy Horseman, PA-C   Critical care provider statement:    Critical care time (minutes):  49   Critical care was necessary to treat or prevent imminent or life-threatening deterioration of the following conditions:  Renal failure and hepatic failure   Critical care was time spent personally by me on the following activities:  Development of treatment plan with patient or surrogate, discussions with consultants,  evaluation of patient's response to treatment, examination of patient, ordering and review of laboratory studies, ordering and review of radiographic studies, ordering and performing treatments and interventions, pulse oximetry, re-evaluation of patient's condition and review of old charts   ED Course and Medical Decision Making  I have reviewed the triage vital signs, the nursing notes, and pertinent available records from the EMR.  Social Determinants Affecting Complexity of Care: Patient has no clinically significant social determinants affecting this chief complaint..   ED Course: Clinical Course as of 09/07/23 0437  Tue Sep 06, 2023  2219 Hemoccult negative [RB]  Wed Sep 07, 2023  1610 Comprehensive metabolic panel(!) Markedly elevated LFTs, with recent fall, will check CK. Creatinine elevated at 2.3, getting fluids.  Will need admission. [RB]    Clinical Course User Index [RB] Roxy Horseman, PA-C    Medical Decision Making Patient here with generalized weakness.  He has noted to be hypotensive and hypothermic in triage.  Sepsis labs ordered.  Patient will be given fluids.  Will be given warming blanket.  Labs and imaging pending.   Amount and/or Complexity of Data Reviewed Labs: ordered. Radiology: ordered.  Risk Prescription drug management. Decision regarding hospitalization.          Consultants: I consulted with Hospitalist, Dr. Margo Aye, who is appreciated for admitting.   Treatment and Plan: Patient's exam and diagnostic results are concerning for hypotension, AKI, elevated LFTs.  Feel that patient will need admission to the hospital for further treatment and evaluation.    Final Clinical Impressions(s) / ED Diagnoses     ICD-10-CM   1. Syncope, unspecified syncope type  R55     2. Hypotension, unspecified hypotension type  I95.9     3. AKI (acute kidney injury) (HCC)  N17.9     4. Elevated LFTs  R79.89       ED Discharge Orders     None         Discharge Instructions Discussed with and Provided to Patient:   Discharge Instructions   None      Roxy Horseman, PA-C 09/07/23 0440    Sabas Sous, MD 09/07/23 [phone]

## 2023-09-06 NOTE — ED Triage Notes (Signed)
Pt BIB [facility] from home. Past few days feeling weak and having poor oral intake. No fever, no n/v/d. Pt notes drainage to left ear. EMS reports negative halo sign.In the past 24 hours pt has been dizzy and has had multiple syncopal episodes. Pt had a similar hypotensive episode in Nov. Upon arrival is A&Ox4, GCS 15 and cool to touch.  EMS  250 NS 4mg  Zofran 91 CBG 70/40 BP 90P 98% RA 98.7 T 20 LAC

## 2023-09-07 ENCOUNTER — Emergency Department (HOSPITAL_COMMUNITY): Payer: Medicare PPO

## 2023-09-07 DIAGNOSIS — M1990 Unspecified osteoarthritis, unspecified site: Secondary | ICD-10-CM | POA: Diagnosis present

## 2023-09-07 DIAGNOSIS — R55 Syncope and collapse: Secondary | ICD-10-CM | POA: Diagnosis present

## 2023-09-07 DIAGNOSIS — E872 Acidosis, unspecified: Secondary | ICD-10-CM | POA: Diagnosis present

## 2023-09-07 DIAGNOSIS — Z8249 Family history of ischemic heart disease and other diseases of the circulatory system: Secondary | ICD-10-CM | POA: Diagnosis not present

## 2023-09-07 DIAGNOSIS — E869 Volume depletion, unspecified: Secondary | ICD-10-CM | POA: Diagnosis present

## 2023-09-07 DIAGNOSIS — R7401 Elevation of levels of liver transaminase levels: Secondary | ICD-10-CM

## 2023-09-07 DIAGNOSIS — G894 Chronic pain syndrome: Secondary | ICD-10-CM

## 2023-09-07 DIAGNOSIS — N179 Acute kidney failure, unspecified: Secondary | ICD-10-CM | POA: Diagnosis present

## 2023-09-07 DIAGNOSIS — D689 Coagulation defect, unspecified: Secondary | ICD-10-CM | POA: Diagnosis present

## 2023-09-07 DIAGNOSIS — T391X1A Poisoning by 4-Aminophenol derivatives, accidental (unintentional), initial encounter: Secondary | ICD-10-CM

## 2023-09-07 DIAGNOSIS — N400 Enlarged prostate without lower urinary tract symptoms: Secondary | ICD-10-CM | POA: Diagnosis present

## 2023-09-07 DIAGNOSIS — H6690 Otitis media, unspecified, unspecified ear: Secondary | ICD-10-CM

## 2023-09-07 DIAGNOSIS — Z1152 Encounter for screening for COVID-19: Secondary | ICD-10-CM | POA: Diagnosis not present

## 2023-09-07 DIAGNOSIS — M549 Dorsalgia, unspecified: Secondary | ICD-10-CM | POA: Diagnosis present

## 2023-09-07 DIAGNOSIS — I5032 Chronic diastolic (congestive) heart failure: Secondary | ICD-10-CM | POA: Diagnosis present

## 2023-09-07 DIAGNOSIS — I951 Orthostatic hypotension: Secondary | ICD-10-CM | POA: Diagnosis present

## 2023-09-07 DIAGNOSIS — F419 Anxiety disorder, unspecified: Secondary | ICD-10-CM | POA: Diagnosis present

## 2023-09-07 DIAGNOSIS — I130 Hypertensive heart and chronic kidney disease with heart failure and stage 1 through stage 4 chronic kidney disease, or unspecified chronic kidney disease: Secondary | ICD-10-CM | POA: Diagnosis present

## 2023-09-07 DIAGNOSIS — F32A Depression, unspecified: Secondary | ICD-10-CM | POA: Diagnosis present

## 2023-09-07 DIAGNOSIS — E785 Hyperlipidemia, unspecified: Secondary | ICD-10-CM | POA: Diagnosis present

## 2023-09-07 DIAGNOSIS — K219 Gastro-esophageal reflux disease without esophagitis: Secondary | ICD-10-CM | POA: Diagnosis present

## 2023-09-07 DIAGNOSIS — Z801 Family history of malignant neoplasm of trachea, bronchus and lung: Secondary | ICD-10-CM | POA: Diagnosis not present

## 2023-09-07 DIAGNOSIS — H6692 Otitis media, unspecified, left ear: Secondary | ICD-10-CM | POA: Diagnosis present

## 2023-09-07 DIAGNOSIS — E1122 Type 2 diabetes mellitus with diabetic chronic kidney disease: Secondary | ICD-10-CM | POA: Diagnosis present

## 2023-09-07 DIAGNOSIS — M419 Scoliosis, unspecified: Secondary | ICD-10-CM | POA: Diagnosis present

## 2023-09-07 DIAGNOSIS — K719 Toxic liver disease, unspecified: Secondary | ICD-10-CM | POA: Diagnosis present

## 2023-09-07 DIAGNOSIS — N1831 Chronic kidney disease, stage 3a: Secondary | ICD-10-CM | POA: Diagnosis present

## 2023-09-07 HISTORY — DX: Otitis media, unspecified, unspecified ear: H66.90

## 2023-09-07 HISTORY — DX: Poisoning by 4-aminophenol derivatives, accidental (unintentional), initial encounter: T39.1X1A

## 2023-09-07 HISTORY — DX: Elevation of levels of liver transaminase levels: R74.01

## 2023-09-07 LAB — COMPREHENSIVE METABOLIC PANEL
ALT: 769 U/L — ABNORMAL HIGH (ref 0–44)
AST: 509 U/L — ABNORMAL HIGH (ref 15–41)
Albumin: 2.4 g/dL — ABNORMAL LOW (ref 3.5–5.0)
Alkaline Phosphatase: 61 U/L (ref 38–126)
Anion gap: 21 — ABNORMAL HIGH (ref 5–15)
BUN: 22 mg/dL (ref 8–23)
CO2: 9 mmol/L — ABNORMAL LOW (ref 22–32)
Calcium: 7.9 mg/dL — ABNORMAL LOW (ref 8.9–10.3)
Chloride: 111 mmol/L (ref 98–111)
Creatinine, Ser: 2.37 mg/dL — ABNORMAL HIGH (ref 0.61–1.24)
GFR, Estimated: 28 mL/min — ABNORMAL LOW (ref >60)
Glucose, Bld: 95 mg/dL (ref 70–99)
Potassium: 3.9 mmol/L (ref 3.5–5.1)
Sodium: 141 mmol/L (ref 135–145)
Total Bilirubin: 1.2 mg/dL (ref 0.0–1.2)
Total Protein: 4.8 g/dL — ABNORMAL LOW (ref 6.5–8.1)

## 2023-09-07 LAB — CK: Total CK: 74 U/L (ref 49–397)

## 2023-09-07 LAB — ETHANOL: Alcohol, Ethyl (B): <10 mg/dL (ref <10)

## 2023-09-07 LAB — URINALYSIS, W/ REFLEX TO CULTURE (INFECTION SUSPECTED)
Bacteria, UA: NONE SEEN
Bilirubin Urine: NEGATIVE
Glucose, UA: NEGATIVE mg/dL
Hgb urine dipstick: NEGATIVE
Ketones, ur: 5 mg/dL — AB
Leukocytes,Ua: NEGATIVE
Nitrite: NEGATIVE
Protein, ur: 30 mg/dL — AB
Specific Gravity, Urine: 1.032 — ABNORMAL HIGH (ref 1.005–1.030)
pH: 5 (ref 5.0–8.0)

## 2023-09-07 LAB — I-STAT CG4 LACTIC ACID, ED
Lactic Acid, Venous: 0.6 mmol/L (ref 0.5–1.9)
Lactic Acid, Venous: 1.3 mmol/L (ref 0.5–1.9)

## 2023-09-07 LAB — I-STAT VENOUS BLOOD GAS, ED
Acid-base deficit: 20 mmol/L — ABNORMAL HIGH (ref 0.0–2.0)
Bicarbonate: 6.6 mmol/L — ABNORMAL LOW (ref 20.0–28.0)
Calcium, Ion: 1.21 mmol/L (ref 1.15–1.40)
HCT: 35 % — ABNORMAL LOW (ref 39.0–52.0)
Hemoglobin: 11.9 g/dL — ABNORMAL LOW (ref 13.0–17.0)
O2 Saturation: 95 %
Potassium: 4.4 mmol/L (ref 3.5–5.1)
Sodium: 138 mmol/L (ref 135–145)
TCO2: 7 mmol/L — ABNORMAL LOW (ref 22–32)
pCO2, Ven: 19 mm[Hg] — CL (ref 44–60)
pH, Ven: 7.149 — CL (ref 7.25–7.43)
pO2, Ven: 94 mm[Hg] — ABNORMAL HIGH (ref 32–45)

## 2023-09-07 LAB — CBC
HCT: 37.1 % — ABNORMAL LOW (ref 39.0–52.0)
Hemoglobin: 12.2 g/dL — ABNORMAL LOW (ref 13.0–17.0)
MCH: 36.3 pg — ABNORMAL HIGH (ref 26.0–34.0)
MCHC: 32.9 g/dL (ref 30.0–36.0)
MCV: 110.4 fL — ABNORMAL HIGH (ref 80.0–100.0)
Platelets: 171 10*3/uL (ref 150–400)
RBC: 3.36 MIL/uL — ABNORMAL LOW (ref 4.22–5.81)
RDW: 15.9 % — ABNORMAL HIGH (ref 11.5–15.5)
WBC: 10.9 10*3/uL — ABNORMAL HIGH (ref 4.0–10.5)
nRBC: 0 % (ref 0.0–0.2)

## 2023-09-07 LAB — BLOOD GAS, VENOUS
Acid-base deficit: 15.8 mmol/L — ABNORMAL HIGH (ref 0.0–2.0)
Bicarbonate: 10.2 mmol/L — ABNORMAL LOW (ref 20.0–28.0)
O2 Saturation: 46.8 %
Patient temperature: 34.4
pCO2, Ven: 22 mm[Hg] — ABNORMAL LOW (ref 44–60)
pH, Ven: 7.25 (ref 7.25–7.43)
pO2, Ven: <31 mm[Hg] — CL (ref 32–45)

## 2023-09-07 LAB — RESP PANEL BY RT-PCR (RSV, FLU A&B, COVID)  RVPGX2
Influenza A by PCR: NEGATIVE
Influenza B by PCR: NEGATIVE
Resp Syncytial Virus by PCR: NEGATIVE
SARS Coronavirus 2 by RT PCR: NEGATIVE

## 2023-09-07 LAB — GLUCOSE, CAPILLARY: Glucose-Capillary: 93 mg/dL (ref 70–99)

## 2023-09-07 LAB — SALICYLATE LEVEL: Salicylate Lvl: <7 mg/dL — ABNORMAL LOW (ref 7.0–30.0)

## 2023-09-07 LAB — PROTIME-INR
INR: 1.7 — ABNORMAL HIGH (ref 0.8–1.2)
Prothrombin Time: 20 s — ABNORMAL HIGH (ref 11.4–15.2)

## 2023-09-07 LAB — PHOSPHORUS: Phosphorus: 4.7 mg/dL — ABNORMAL HIGH (ref 2.5–4.6)

## 2023-09-07 LAB — ACETAMINOPHEN LEVEL
Acetaminophen (Tylenol), Serum: 152 ug/mL (ref 10–30)
Acetaminophen (Tylenol), Serum: 81 ug/mL — ABNORMAL HIGH (ref 10–30)

## 2023-09-07 LAB — TROPONIN I (HIGH SENSITIVITY)
Troponin I (High Sensitivity): 23 ng/L — ABNORMAL HIGH (ref <18)
Troponin I (High Sensitivity): 26 ng/L — ABNORMAL HIGH (ref <18)

## 2023-09-07 LAB — MAGNESIUM: Magnesium: 2.1 mg/dL (ref 1.7–2.4)

## 2023-09-07 MED ORDER — LIDOCAINE 5 % EX PTCH
1.0000 | MEDICATED_PATCH | CUTANEOUS | Status: DC
  Administered 2023-09-07 – 2023-09-09 (×3): 1 via TRANSDERMAL
  Filled 2023-09-07 (×3): qty 1

## 2023-09-07 MED ORDER — VENLAFAXINE HCL ER 75 MG PO CP24
75.0000 mg | ORAL_CAPSULE | Freq: Every day | ORAL | Status: DC
  Administered 2023-09-07 – 2023-09-09 (×3): 75 mg via ORAL
  Filled 2023-09-07 (×3): qty 1

## 2023-09-07 MED ORDER — OXYCODONE HCL 5 MG PO TABS
5.0000 mg | ORAL_TABLET | ORAL | Status: DC | PRN
Start: 2023-09-07 — End: 2023-09-09
  Administered 2023-09-07 – 2023-09-09 (×6): 10 mg via ORAL
  Filled 2023-09-07 (×2): qty 2
  Filled 2023-09-07: qty 1
  Filled 2023-09-07 (×4): qty 2

## 2023-09-07 MED ORDER — ACETYLCYSTEINE LOAD VIA INFUSION
150.0000 mg/kg | Freq: Once | INTRAVENOUS | Status: AC
  Administered 2023-09-07: 10545 mg via INTRAVENOUS
  Filled 2023-09-07: qty 346

## 2023-09-07 MED ORDER — SODIUM BICARBONATE 8.4 % IV SOLN
100.0000 meq | INTRAVENOUS | Status: AC
  Administered 2023-09-07: 100 meq via INTRAVENOUS
  Filled 2023-09-07: qty 50

## 2023-09-07 MED ORDER — AMOXICILLIN-POT CLAVULANATE 500-125 MG PO TABS
1.0000 | ORAL_TABLET | Freq: Two times a day (BID) | ORAL | Status: DC
  Administered 2023-09-07 – 2023-09-09 (×5): 1 via ORAL
  Filled 2023-09-07 (×6): qty 1

## 2023-09-07 MED ORDER — PROCHLORPERAZINE EDISYLATE 10 MG/2ML IJ SOLN: 5.0000 mg | Freq: Four times a day (QID) | INTRAMUSCULAR | Status: DC | PRN

## 2023-09-07 MED ORDER — POLYETHYLENE GLYCOL 3350 17 G PO PACK: 17.0000 g | PACK | Freq: Every day | ORAL | Status: DC | PRN

## 2023-09-07 MED ORDER — ACETYLCYSTEINE LOAD VIA INFUSION: 150.0000 mg/kg | Freq: Once | INTRAVENOUS | Status: DC

## 2023-09-07 MED ORDER — FOMEPIZOLE 1 GM/ML IV SOLN
15.0000 mg/kg | Freq: Once | INTRAVENOUS | Status: AC
  Administered 2023-09-07: 1050 mg via INTRAVENOUS
  Filled 2023-09-07: qty 1.05

## 2023-09-07 MED ORDER — DEXTROSE 5 % IV SOLN
15.0000 mg/kg/h | INTRAVENOUS | Status: DC
  Administered 2023-09-07 – 2023-09-08 (×4): 15 mg/kg/h via INTRAVENOUS
  Filled 2023-09-07 (×3): qty 90

## 2023-09-07 MED ORDER — PANTOPRAZOLE SODIUM 40 MG PO TBEC
40.0000 mg | DELAYED_RELEASE_TABLET | Freq: Every day | ORAL | Status: DC
  Administered 2023-09-07 – 2023-09-09 (×3): 40 mg via ORAL
  Filled 2023-09-07 (×3): qty 1

## 2023-09-07 MED ORDER — MIDODRINE HCL 5 MG PO TABS
5.0000 mg | ORAL_TABLET | Freq: Two times a day (BID) | ORAL | Status: DC
  Administered 2023-09-07 – 2023-09-09 (×4): 5 mg via ORAL
  Filled 2023-09-07 (×4): qty 1

## 2023-09-07 NOTE — Assessment & Plan Note (Addendum)
-   In setting of Tylenol overdose

## 2023-09-07 NOTE — H&P (Addendum)
History and Physical  [name] XBJ:[id number] DOB: September 18, 1950 DOA: 09/06/2023  Referring physician:  Roxy Horseman, PA-EDP PCP: [name], MD  Outpatient Specialists: Cardiology, oncology. Patient coming from: Home.  Chief Complaint: Loss of consciousness x 2.  HPI: Justin Patel is a 73 y.o. male with medical history significant for chronic HFpEF, history of morbid obesity status post gastric bypass surgery in 2010, type 2 diabetes, orthostatic hypotension, hypertension, hyperlipidemia, chronic anxiety/depression, degenerative disc disease with previous back surgery, chronic back pain, GERD, who presented to the ER via EMS after 2 episodes of syncope in the last 2 days.  States she has had worsening back pain in the last few days and has taken more Tylenol.  Has had no prodrome prior to losing consciousness.  They occurred in seated and standing position.  Associated with hypotension.  No reported anginal symptoms.  No subjective fevers, no emesis.  Periodically he has diarrhea which is chronic.  In the ER, hypotensive with positive orthostatic vital signs, responded well to IV fluid bolus.  Lab studies notable for severe anion gap metabolic acidosis with serum bicarb less than 7 and VBG pH of 7.149, lactic acidosis 2.3, significantly elevated LFTs and elevated INR.  Acetaminophen level of 152.  Started on acetylcysteine with the guidance of poison control, pharmacy assisting.  Right upper quadrant abdominal ultrasound was unrevealing, showing, bile duct dilatation in the setting of cholecystectomy, no choledocholithiasis seen.  TRH, hospitalist service, was asked to admit.  ED Course: The patient in 7.5.  BP 115/60 after IV fluid bolus, pulse 84, respiratory 16, O2 saturation 100% on room room air.  Review of Systems: Review of systems as noted in the HPI. All other systems reviewed and are negative.   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  removal scheduled to clear margins.   CHF (congestive heart failure) (HCC)    no longer after weight loss   Chronic kidney disease    cysts   Degenerative disc disease    Depression    Diabetes mellitus    no longer diabetic-or on meds   DJD (degenerative joint disease)    Dysplastic nevus 02/04/2016   R upper back paraspinal - severe   Family history of adverse reaction to anesthesia    sons wake up combative   Foot fracture, left 01/2022   Gastric ulcer    GERD (gastroesophageal reflux disease)    H/O hiatal hernia    Headache(784.0)    sinus   Heart murmur    History of fusion of thoracic spine 12/28/2021   History of gastric bypass    Hx of congestive heart failure    Hyperlipidemia    Hypertension    Iron deficiency anemia 02/28/2015   Lumbar scoliosis    Opiate abuse, continuous (HCC) 06/20/2015   S/P laparoscopic cholecystectomy 02/18/2020   Sacral fracture, closed (HCC)    Seizures (HCC)    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, after GI bleed   Sleep apnea    hx not now since wt loss   Stones in the urinary tract    Syncope and collapse    Past Surgical History:  Procedure Laterality Date   ANTERIOR CERVICAL DECOMP/DISCECTOMY FUSION  01/26/2012   Procedure: ANTERIOR CERVICAL DECOMPRESSION/DISCECTOMY FUSION 3 LEVELS;  Surgeon:  [name], MD;  Location: [facility];  Service: Neurosurgery;  Laterality: N/A;  Cervical three-four Cervical four-five Cervical five-six Cervical six-seven , Anterior cervical decompression/diskectomy, fusion, plate   ANTERIOR LAT LUMBAR FUSION Right 04/25/2018   Procedure: Right Lumbar two-three Lumbar three-four Lumbar four-five anterior  lateral interbody fusion  with posterior percutaneous pedicle screws;  Surgeon: [name], MD;  Location: [facility] OR;  Service: Neurosurgery;  Laterality: Right;    APPENDECTOMY     CARDIAC CATHETERIZATION     [facility], normal   CARDIAC CATHETERIZATION     [place]   CHOLECYSTECTOMY     COLONOSCOPY WITH PROPOFOL N/A 12/27/2017   Procedure: COLONOSCOPY WITH PROPOFOL;  Surgeon: Toledo, [name], MD;  Location: [facility];  Service: Gastroenterology;  Laterality: N/A;   ESOPHAGOGASTRODUODENOSCOPY (EGD) WITH PROPOFOL N/A 12/27/2017   Procedure: ESOPHAGOGASTRODUODENOSCOPY (EGD) WITH PROPOFOL;  Surgeon: Toledo, [name], MD;  Location: [facility];  Service: Gastroenterology;  Laterality: N/A;   ESOPHAGOGASTRODUODENOSCOPY (EGD) WITH PROPOFOL N/A 06/19/2021   Procedure: ESOPHAGOGASTRODUODENOSCOPY (EGD) WITH PROPOFOL;  Surgeon: [name], MD;  Location: [facility];  Service: Gastroenterology;  Laterality: N/A;   ESOPHAGOGASTRODUODENOSCOPY (EGD) WITH PROPOFOL N/A 09/25/2021   Procedure: ESOPHAGOGASTRODUODENOSCOPY (EGD) WITH PROPOFOL;  Surgeon: [name], MD;  Location: [facility];  Service: Endoscopy;  Laterality: N/A;   GASTRIC BYPASS  08/09/2008   [employer] [facility]   HARDWARE REMOVAL N/A 03/08/2022   Procedure: Removal of bilateral Thoracic ten pedicle screws;  Surgeon: [name], MD;  Location[facility] OR;  Service: Neurosurgery;  Laterality: N/A;   HERNIA REPAIR  08/09/1998   hiatal   JOINT REPLACEMENT     KNEE ARTHROSCOPY     bilateral, left x 2   LAMINECTOMY WITH POSTERIOR LATERAL ARTHRODESIS LEVEL 4 N/A 01/14/2020   Procedure: Decompressive Laminectomy Lumbar One with pedicle screw fixation from Thoracic Ten to Lumbar Two;  Surgeon: [name], MD;  Location: [facility] OR;  Service: Neurosurgery;  Laterality: N/A;  Decompressive Laminectomy Lumbar One with pedicle screw fixation from Thoracic Ten to Lumbar Two   LUMBAR PERCUTANEOUS PEDICLE SCREW 3 LEVEL N/A 04/25/2018   Procedure: LUMBAR PERCUTANEOUS PEDICLE SCREW 3 LEVEL;  Surgeon: [name], MD;  Location: [facility] OR;  Service: Neurosurgery;  Laterality: N/A;    NASAL SINUS SURGERY     x5   OSTEOTOMY  08/10/1999   left   ROUX-EN-Y GASTRIC BYPASS     THORACIC LAMINECTOMY FOR EPIDURAL ABSCESS N/A 03/28/2022   Procedure: THORACIC WOUND WASHOUT;  Surgeon: [name], MD;  Location: [facility] OR;  Service: Neurosurgery;  Laterality: N/A;   TONSILLECTOMY     TOTAL KNEE ARTHROPLASTY Left 05/09/2014   Dr. [name]   UVULOPALATOPLASTY  08/09/2009    Social History:  reports that he has never smoked. He has never used smokeless tobacco. He reports current alcohol use of about 21.0 standard drinks of alcohol per week. He reports that he does not use drugs.   Allergies  Allergen Reactions   Altace [Ramipril] Anaphylaxis   Ace Inhibitors Hives   Levaquin [Levofloxacin In D5w] Hives   Lyrica [Pregabalin] Other (See Comments)    Edema   Metformin Diarrhea    High doses cause diarrhea.    Trulicity [Dulaglutide] Nausea And Vomiting    Family History  Problem Relation Age of Onset   Dementia Mother 15   Osteoporosis Mother    Heart disease Father 19   Diabetes Father    Lymphoma Sister  lymphoma, stage 4   Ovarian cancer Sister 21       Ovarian   Lupus Sister    Prostate cancer Maternal Uncle    Skin cancer Maternal Uncle    Tongue cancer Maternal Uncle        uncle died of heart attack   Alcoholism Maternal Uncle    Lung cancer Paternal Aunt        pat aunts x 4 died of lung cancer   Lung cancer Paternal Aunt    Lung cancer Paternal Aunt    Lung cancer Paternal Aunt    Mesothelioma Cousin        paternal cousin      Prior to Admission medications   Medication Sig Start Date End Date Taking? Authorizing Provider  acetaminophen (TYLENOL) 500 MG tablet Take 1,500 mg by mouth 2 (two) times daily as needed for moderate pain (pain score 4-6) or mild pain (pain score 1-3).   Yes [provider]  amLODipine (NORVASC) 5 MG tablet Take 1 tablet (5 mg total) by mouth daily. 07/20/23  Yes [name], MD  Calcium  Carbonate-Vitamin D (CALCIUM 600/VITAMIN D PO) Take 1 tablet by mouth daily after supper.   Yes [provider]  Cholecalciferol 50 MCG (2000 UT) TABS Take 2,000 Units by mouth daily after supper. 01/31/20  Yes [provider]  esomeprazole (NEXIUM) 40 MG capsule TAKE 1 CAPSULE BY MOUTH 2 TIMES DAILY BEFORE MEALS. 07/29/23  Yes [name], MD  ezetimibe (ZETIA) 10 MG tablet Take 1 tablet (10 mg total) by mouth daily. 01/11/23  Yes [name], MD  fluticasone (FLONASE) 50 MCG/ACT nasal spray Place 2 sprays into both nostrils daily.   Yes [provider]  gabapentin (NEURONTIN) 300 MG capsule Take 300-600 mg by mouth 3 (three) times daily. Take one capsule in the morning, Take two capsules in the afternoon, and one capsule at night   Yes [provider]  methocarbamol (ROBAXIN) 750 MG tablet TAKE ONE TABLET BY MOUTH EVERY EIGHT HOURS AS NEEDED FOR SPASM 08/02/23  Yes [name], MD  Multiple Vitamin (MULTIVITAMIN) capsule Take 1 capsule by mouth daily. With copper and zinc 07/11/18  Yes [name], MD  ondansetron (ZOFRAN-ODT) 4 MG disintegrating tablet Allow 1-2 tablets to dissolve in your mouth every 8 hours as needed for nausea/vomiting 02/08/23  Yes Loleta Rose, MD  oxyCODONE-acetaminophen (PERCOCET) 7.5-325 MG tablet Take 1 tablet by mouth 4 (four) times daily as needed.   Yes [provider]  primidone (MYSOLINE) 50 MG tablet Take 1 tablet (50 mg total) by mouth at bedtime. 12/08/22  Yes Jaffe, Adam R, DO  rosuvastatin (CRESTOR) 40 MG tablet Take 1 tablet (40 mg total) by mouth daily. Patient taking differently: Take 40 mg by mouth at bedtime. 02/01/23  Yes [name], MD  sucralfate (CARAFATE) 1 g tablet TAKE 1 TABLET BY MOUTH 4 TIMES DAILY 07/26/23  Yes [name], MD  TRADJENTA 5 MG TABS tablet TAKE 1 TABLET BY MOUTH DAILY 08/09/23  Yes [name], MD  valsartan (DIOVAN) 80 MG tablet Take 1 tablet (80  mg total) by mouth at bedtime. 08/18/23  Yes Furth, Cadence H, PA-C  venlafaxine XR (EFFEXOR-XR) 75 MG 24 hr capsule TAKE ONE CAPSULE EVERY MORNING WITH BREAKFAST 06/28/23  Yes [name], MD    Physical Exam: BP (!) 110/58   Pulse 79   Temp (!) 97.5 F (36.4 C) (Oral)  Resp 16   Ht 5\' 6"  (1.676 m)   Wt 70.3 kg   SpO2 100%   BMI 25.02 kg/m   General: 73 y.o. year-old male well developed well nourished in no acute distress.  Alert and oriented x3. Cardiovascular: Regular rate and rhythm with no rubs or gallops.  No thyromegaly or JVD noted.  Trace lower extremity edema bilaterally. Respiratory: Clear to auscultation with no wheezes or rales. Good inspiratory effort. Abdomen: Soft nontender nondistended with normal bowel sounds x4 quadrants. Muskuloskeletal: No cyanosis or clubbing noted bilaterally Neuro: CN II-XII intact, strength, sensation, reflexes Skin: No ulcerative lesions noted or rashes Psychiatry: Judgement and insight appear normal. Mood is appropriate for condition and setting          Labs on Admission:  Basic Metabolic Panel: Recent Labs  Lab 09/06/23 2207 09/07/23 0228  NA 137 138  K 3.9 4.4  CL 110  --   CO2 <7*  --   GLUCOSE 127*  --   BUN 20  --   CREATININE 2.31*  --   CALCIUM 8.3*  --    Liver Function Tests: Recent Labs  Lab 09/06/23 2207  AST 700*  ALT 896*  ALKPHOS 69  BILITOT 0.8  PROT 5.2*  ALBUMIN 2.6*   No results for input(s): "LIPASE", "AMYLASE" in the last 168 hours. No results for input(s): "AMMONIA" in the last 168 hours. CBC: Recent Labs  Lab 09/06/23 2211 09/07/23 0228  WBC 9.1  --   NEUTROABS 8.0*  --   HGB 12.8* 11.9*  HCT 40.0 35.0*  MCV 112.0*  --   PLT 178  --    Cardiac Enzymes: Recent Labs  Lab 09/06/23 2355  CKTOTAL 74    BNP (last 3 results) No results for input(s): "BNP" in the last 8760 hours.  ProBNP (last 3 results) No results for input(s): "PROBNP" in the last 8760 hours.  CBG: No  results for input(s): "GLUCAP" in the last 168 hours.  Radiological Exams on Admission: US Abdomen Limited RUQ (LIVER/GB) Result Date: 09/07/2023 CLINICAL DATA:  151471 RUQ pain 151471 EXAM: ULTRASOUND ABDOMEN LIMITED RIGHT UPPER QUADRANT COMPARISON:  CT abdomen pelvis 07/02/2023, CT abdomen pelvis 11/22/2022 FINDINGS: Gallbladder: Cholecystectomy. Common bile duct: Diameter: 12 mm-chronic dilated which can be seen in the post cholecystectomy setting. Liver: No focal lesion identified. Within normal limits in parenchymal echogenicity. Portal vein is patent on color Doppler imaging with normal direction of blood flow towards the liver. Other: Simple right renal cyst-no further follow-up indicated. IMPRESSION: Status post cholecystectomy with persistent common bile duct dilatation which can be seen in this setting. Electronically Signed   By: Tish Frederickson M.D.   On: 09/07/2023 00:29   CT HEAD WO CONTRAST ( ) Result Date: 09/07/2023 CLINICAL DATA:  Head trauma, minor (Age >= 65y) Past few days feeling weak and having poor oral intake. No fever, no n/v/d. Pt notes drainage to left ear. EMS reports negative halo sign.In the past 24 hours pt has been dizzy EXAM: CT HEAD WITHOUT CONTRAST TECHNIQUE: Contiguous axial images were obtained from the base of the skull through the vertex without intravenous contrast. RADIATION DOSE REDUCTION: This exam was performed according to the departmental dose-optimization program which includes automated exposure control, adjustment of the mA and/or kV according to patient size and/or use of iterative reconstruction technique. COMPARISON:  MRI head 07/09/2023, CT head 07/02/2023, CT head 12/17/2021 FINDINGS: Brain: Patchy and confluent areas of decreased attenuation are noted throughout the deep and periventricular white  matter of the cerebral hemispheres bilaterally, compatible with chronic microvascular ischemic disease. no evidence of large-territorial acute infarction. No  parenchymal hemorrhage. No mass lesion. No extra-axial collection. No mass effect or midline shift. No hydrocephalus. Basilar cisterns are patent. Vascular: No hyperdense vessel. Skull: No acute fracture or focal lesion. Sinuses/Orbits: Left maxillary sinus mucosal thickening with associated hypertrophy of the sinus walls. Complete left mastoid air cell effusion. Partial right mastoid air cell effusion. Fluid noted within the left middle ear. Otherwise paranasal sinuses are clear. The orbits are unremarkable. Other: None. IMPRESSION: 1. No acute intracranial abnormality. 2. Chronic left maxillary sinus disease. 3. Complete left mastoid air cell effusion with associated middle ear fluid. 4. Partial right mastoid air cell effusion. Electronically Signed   By: Tish Frederickson M.D.   On: 09/07/2023 00:26   DG Chest Port 1 View Result Date: 09/06/2023 CLINICAL DATA:  Questionable sepsis - evaluate for abnormality EXAM: PORTABLE CHEST 1 VIEW COMPARISON:  07/02/2023 FINDINGS: The cardiomediastinal contours are normal. The lungs are clear. Pulmonary vasculature is normal. No consolidation, pleural effusion, or pneumothorax. Multiple remote left rib fractures. Lumbar and cervical hardware is partially included. Chronic ossified intra-articular body left axillary recess. IMPRESSION: No active disease. Electronically Signed   By: Narda Rutherford M.D.   On: 09/06/2023 22:25    EKG: I independently viewed the EKG done and my findings are as followed: Sinus rhythm rate of 89.  Nonspecific ST-T changes.  QTc 465.  Assessment/Plan Present on Admission:  Syncope  Principal Problem:   Syncope  Syncope, suspect secondary to orthostatic hypotension Positive orthostatic vital signs Initially hypotensive, responded well to IV fluid. Add midodrine 5 mg twice daily Closely monitor vital signs. Fall precautions PT OT assessment  Acetaminophen accidental overdose due to overuse Has been having increased back pain and  using more Tylenol Hold off home Tylenol Presented with acetaminophen level of 152 Received acetylcysteine bolus and drip, fomepizole was added Salicylate level was negative Poison control was consulted with the assistance of pharmacy, highly appreciated.  Acute transaminitis in the setting of acetaminophen overdose Auto coagulopathy in the setting of acute liver injury AST 700 ALT 896 INR 1.7 Hold off and avoid hepatotoxic agents Repeat CMP in the morning  Severe nongap metabolic acidosis with serum bicarb less than 7, anion gap could not be calculated, pH 7.149 on VBG Received 2 A of bicarb Repeat CMP and VBG Continue to treat as indicated.  Chronic anxiety/depression Resume home regimen  GERD Resume home PPI  Chronic back pain Lidocaine patch. Follows with neurosurgery   Critical care time: 55 minutes.   DVT prophylaxis: SCDs  Code Status: Full code  Family Communication: Wife at bedside.  Disposition Plan: Admitted to progressive care unit.  Consults called: Poison control, pharmacy.  Admission status: Inpatient status.   Status is: Inpatient The patient requires at least 2 midnights for further evaluation and treatment of present condition.   Darlin Drop MD [facility] Pager [phone]  If 7PM-7AM, please contact night-coverage [email or web address] Password [email or web address]  09/07/2023, 3:09 AM

## 2023-09-07 NOTE — Assessment & Plan Note (Addendum)
-   Hold Crestor for now; held at discharge for approximately 2 more weeks -Repeat LFTs at follow-up

## 2023-09-07 NOTE — Hospital Course (Addendum)
Justin Patel is a 73 y.o. male with medical history significant for chronic HFpEF, history of morbid obesity status post gastric bypass surgery in 2010, type 2 diabetes, orthostatic hypotension, hypertension, hyperlipidemia, chronic anxiety/depression, degenerative disc disease with previous back surgery, chronic back pain, GERD, who presented to the ER via EMS after 2 episodes of syncope in the last 2 days.   He states he has been taking increased amounts of Tylenol at home.  He endorsed taking approximately 2 tablets of arthritis Tylenol and 3 tablets of extra strength in the morning and again at night.  He denies taking any of his Percocet. In the ER he was found to be hypotensive with positive orthostatics.  He also had severe anion gap metabolic acidosis, elevated lactic acid, and elevated LFTs. Initial Tylenol level returned at 152.  He was started on N-acetylcysteine and fomepizole. Poison control was also informed.  He responded well to treatment.  Tylenol level returned to undetectable and LFTs improved. He was recommended to continue holding on any Tylenol containing products at discharge for about 1 week.  Oxycodone was prescribed at discharge and patient was instructed to continue holding Percocet.

## 2023-09-07 NOTE — Assessment & Plan Note (Signed)
-  No home meds noted ?

## 2023-09-07 NOTE — Progress Notes (Signed)
Pt admitted to the unit from ED; pt alert and verbally responsive. Telemetry applied and verified with CCMD; skin assessment completed with second RN per protocol; skin intact with no pressure ulcers or opened wound noted except for ecchymosis on arms, hip and legs. Pt oriented to the unit, room and call light. Pt in bed with call light within reach and bed alarm on. Arabella Merles Ladelle Teodoro RN   09/07/23 1517  Vitals  Temp 97.8 F (36.6 C)  Temp Source Oral  BP 119/72  MAP (mmHg) 87  BP Location Left Arm  BP Method Automatic  Patient Position (if appropriate) Lying  Pulse Rate 96  Pulse Rate Source Monitor  ECG Heart Rate 93  Resp 16  Level of Consciousness  Level of Consciousness Alert  MEWS COLOR  MEWS Score Color Green  Oxygen Therapy  SpO2 100 %  O2 Device Room Air  Pain Assessment  Pain Scale 0-10  Pain Score 0  PCA/Epidural/Spinal Assessment  Respiratory Pattern Regular;Unlabored  IS - Achieved (mL) (RN, NT, or RT) 2000 mL  ECG Monitoring  Cardiac Rhythm NSR;ST  Glasgow Coma Scale  Eye Opening 4  Best Verbal Response (NON-intubated) 5  Best Motor Response 6  Glasgow Coma Scale Score 15  MEWS Score  MEWS Temp 0  MEWS Systolic 0  MEWS Pulse 0  MEWS RR 0  MEWS LOC 0  MEWS Score 0

## 2023-09-07 NOTE — Assessment & Plan Note (Addendum)
-   Unintentional but has been taking 5 tablets of Tylenol twice a day.  2 of them are arthritis strength and 3 of them are extra strength -Level 152 on admission -Poison control following - Received fomepizole - Continue N-acetylcysteine protocol; discontinued evening of 09/08/2023 - Tylenol level undetectable and LFTs improved.  INR also normalized

## 2023-09-07 NOTE — Progress Notes (Addendum)
Pharmacy Toxicology Note  Justin Patel is a 73 y.o. 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  Pharmacy has been consulted for N-acetylcysteine dosing and contacted [facility] with recommendations for treatment (1/29 0330, spoke with kevin).   Per patient report tylenol 1950mg  (3 of 650mg ) BID (with minimum ingestion of 3900mg /day) for at least two weeks. Does have percocet at home which he says he has not been taking recently. Unsure of any other tylenol intake. He has been told previously not to take NSAIDs and denies any other over the counter pain medications.  Tylenol level: 152 (which typically correlates with ingestion >10g) AST 700 ALT 896 Scr 2.31 (BL <1)  Plan: Start N-acetylcysteine 150mg /kg load followed by 15mg /kg/hr Start Fomepizole 15mg /kg x 1 given concern for massive ingestion If INR >2, give Vitamin K 10mg  PO x 1 Will trend 4h level tylenol and LFTs Given AKI, will also check salicylate level. If elevated will need Na bicarb continuous infusion and salicylate overdose treatment   Thank you for allowing pharmacy to participate in this patient's care.  Marja Kays, PharmD Emergency Medicine Clinical Pharmacist 09/07/2023,3:50 AM

## 2023-09-07 NOTE — Assessment & Plan Note (Addendum)
-   left ear noted with bulging TM, decreased light reflex and decreased hearing  - start on Augmentin and monitor response -Outpatient follow-up

## 2023-09-07 NOTE — ED Notes (Signed)
Pt of the floor with paramedic transport, no new onsent distress at this time.

## 2023-09-07 NOTE — ED Notes (Signed)
Patient transported to Ultrasound

## 2023-09-07 NOTE — Assessment & Plan Note (Addendum)
-   patient has history of CKD3a. Baseline creat ~ 1.5, eGFR~ 50 - patient presents with increase in creat >0.3 mg/dL above baseline or creat increase >1.5x baseline presumed to have occurred within past 7 days PTA - creat 2.31 on admission - likely some volume depletion and contribution from tylenol OD -Creatinine improved and 1.15 at discharge

## 2023-09-07 NOTE — Progress Notes (Signed)
Progress Note    Justin Patel   ZOX:[id number]  DOB: 1951/02/27  DOA: 09/06/2023     0 PCP: [name], MD  Initial CC: syncope  Hospital Course: Justin Patel is a 73 y.o. male with medical history significant for chronic HFpEF, history of morbid obesity status post gastric bypass surgery in 2010, type 2 diabetes, orthostatic hypotension, hypertension, hyperlipidemia, chronic anxiety/depression, degenerative disc disease with previous back surgery, chronic back pain, GERD, who presented to the ER via EMS after 2 episodes of syncope in the last 2 days.   He states he has been taking increased amounts of Tylenol at home.  He endorsed taking approximately 2 tablets of arthritis Tylenol and 3 tablets of extra strength in the morning and again at night.  He denies taking any of his Percocet. In the ER he was found to be hypotensive with positive orthostatics.  He also had severe anion gap metabolic acidosis, elevated lactic acid, and elevated LFTs. Initial Tylenol level returned at 152.  He was started on N-acetylcysteine and fomepizole. Poison control was also informed.  Interval History:  No further syncope since admission.  He understands Tylenol consumption was above recommended amount.  He was also complaining of left ear pain and discharge concerning for ear infection recently.  He was to have an appointment with his doctor tomorrow he says.  Assessment and Plan: * Syncope-resolved as of 09/07/2023 Positive orthostatic vital signs Initially hypotensive, responded well to IV fluid. Add midodrine 5 mg twice daily; will wean as able Fall precautions PT OT assessment  Tylenol overdose, accidental or unintentional, initial encounter - Unintentional but has been taking 5 tablets of Tylenol twice a day.  2 of them are arthritis strength and 3 of them are extra strength -Level 152 on admission -Poison control following - Received fomepizole - Continue N-acetylcysteine  protocol -Trend LFTs  Acute renal failure superimposed on stage 3a chronic kidney disease (HCC) - patient has history of CKD3a. Baseline creat ~ 1.5, eGFR~ 50 - patient presents with increase in creat >0.3 mg/dL above baseline or creat increase >1.5x baseline presumed to have occurred within past 7 days PTA - creat 2.31 on admission - likely some volume depletion and contribution from tylenol OD   Otitis media - left ear noted with bulging TM, decreased light reflex and decreased hearing  - start on Augmentin and monitor response - hold off on otic drops for now given allergies but can attempt if still needed  Transaminitis - Insetting of Tylenol overdose - Continue trending LFTs  Chronic pain syndrome - Continue on plain oxycodone  BPH (benign prostatic hyperplasia) - No home meds noted  Hyperlipidemia - Hold Crestor for now  Essential hypertension, benign - Hypotensive on admission.  Meds held on admission  Type 2 diabetes mellitus (HCC) - Last A1c 5.8% on 07/15/2023 - Continue diet control for now   Old records reviewed in assessment of this patient  Antimicrobials: Augmentin 1/29 >> current   DVT prophylaxis:  SCDs Start: 09/07/23 0310   Code Status:   Code Status: Full Code  Mobility Assessment (Last 72 Hours)     Mobility Assessment   No documentation.           Barriers to discharge: none Disposition Plan:  Home HH orders placed: TBD Status is: Inpt  Objective: Blood pressure 100/68, pulse 96, temperature 98 F (36.7 C), resp. rate 10, height 5\' 6"  (1.676 m), weight 70.3 kg, SpO2 100%.  Examination:  Physical  Exam Constitutional:      General: He is not in acute distress.    Appearance: Normal appearance.  HENT:     Head: Normocephalic and atraumatic.     Left Ear: Drainage and swelling present. A middle ear effusion is present. There is impacted cerumen. Tympanic membrane is bulging.     Mouth/Throat:     Mouth: Mucous membranes are  moist.  Eyes:     Extraocular Movements: Extraocular movements intact.  Cardiovascular:     Rate and Rhythm: Normal rate and regular rhythm.  Pulmonary:     Effort: Pulmonary effort is normal. No respiratory distress.     Breath sounds: Normal breath sounds. No wheezing.  Abdominal:     General: Bowel sounds are normal. There is no distension.     Palpations: Abdomen is soft.     Tenderness: There is no abdominal tenderness.  Musculoskeletal:        General: Normal range of motion.     Cervical back: Normal range of motion and neck supple.  Skin:    General: Skin is warm and dry.  Neurological:     General: No focal deficit present.     Mental Status: He is alert.  Psychiatric:        Mood and Affect: Mood normal.        Behavior: Behavior normal.      Consultants:    Procedures:    Data Reviewed: Results for orders placed or performed during the hospital encounter of 09/06/23 (from the past 24 hours)  Comprehensive metabolic panel     Status: Abnormal   Collection Time: 09/06/23 10:07 PM  Result Value Ref Range   Sodium 137 135 - 145 mmol/L   Potassium 3.9 3.5 - 5.1 mmol/L   Chloride 110 98 - 111 mmol/L   CO2 <7 (L) 22 - 32 mmol/L   Glucose, Bld 127 (H) 70 - 99 mg/dL   BUN 20 8 - 23 mg/dL   Creatinine, Ser 0.98 (H) 0.61 - 1.24 mg/dL   Calcium 8.3 (L) 8.9 - 10.3 mg/dL   Total Protein 5.2 (L) 6.5 - 8.1 g/dL   Albumin 2.6 (L) 3.5 - 5.0 g/dL   AST 119 (H) 15 - 41 U/L   ALT 896 (H) 0 - 44 U/L   Alkaline Phosphatase 69 38 - 126 U/L   Total Bilirubin 0.8 0.0 - 1.2 mg/dL   GFR, Estimated 29 (L) >60 mL/min   Anion gap NOT CALCULATED 5 - 15  CBC with Differential     Status: Abnormal   Collection Time: 09/06/23 10:11 PM  Result Value Ref Range   WBC 9.1 4.0 - 10.5 K/uL   RBC 3.57 (L) 4.22 - 5.81 MIL/uL   Hemoglobin 12.8 (L) 13.0 - 17.0 g/dL   HCT 14.7 82.9 - 56.2 %   MCV 112.0 (H) 80.0 - 100.0 fL   MCH 35.9 (H) 26.0 - 34.0 pg   MCHC 32.0 30.0 - 36.0 g/dL   RDW  13.0 (H) 86.5 - 15.5 %   Platelets 178 150 - 400 K/uL   nRBC 0.0 0.0 - 0.2 %   Neutrophils Relative % 88 %   Neutro Abs 8.0 (H) 1.7 - 7.7 K/uL   Lymphocytes Relative 7 %   Lymphs Abs 0.6 (L) 0.7 - 4.0 K/uL   Monocytes Relative 4 %   Monocytes Absolute 0.4 0.1 - 1.0 K/uL   Eosinophils Relative 0 %   Eosinophils Absolute 0.0 0.0 - 0.5  K/uL   Basophils Relative 0 %   Basophils Absolute 0.0 0.0 - 0.1 K/uL   WBC Morphology MORPHOLOGY UNREMARKABLE    RBC Morphology MORPHOLOGY UNREMARKABLE    Smear Review Normal platelet morphology    Immature Granulocytes 1 %   Abs Immature Granulocytes 0.08 (H) 0.00 - 0.07 K/uL  Protime-INR     Status: Abnormal   Collection Time: 09/06/23 10:11 PM  Result Value Ref Range   Prothrombin Time 20.4 (H) 11.4 - 15.2 seconds   INR 1.7 (H) 0.8 - 1.2  APTT     Status: Abnormal   Collection Time: 09/06/23 10:11 PM  Result Value Ref Range   aPTT 41 (H) 24 - 36 seconds  POC occult blood, ED     Status: None   Collection Time: 09/06/23 10:17 PM  Result Value Ref Range   Fecal Occult Bld NEGATIVE NEGATIVE  I-Stat Lactic Acid, ED     Status: Abnormal   Collection Time: 09/06/23 10:19 PM  Result Value Ref Range   Lactic Acid, Venous 2.3 (HH) 0.5 - 1.9 mmol/L   Comment NOTIFIED PHYSICIAN   Resp panel by RT-PCR (RSV, Flu A&B, Covid) Anterior Nasal Swab     Status: None   Collection Time: 09/06/23 10:43 PM   Specimen: Anterior Nasal Swab  Result Value Ref Range   SARS Coronavirus 2 by RT PCR NEGATIVE NEGATIVE   Influenza A by PCR NEGATIVE NEGATIVE   Influenza B by PCR NEGATIVE NEGATIVE   Resp Syncytial Virus by PCR NEGATIVE NEGATIVE  Blood Culture (routine x 2)     Status: None (Preliminary result)   Collection Time: 09/06/23 10:44 PM   Specimen: BLOOD  Result Value Ref Range   Specimen Description BLOOD SITE NOT SPECIFIED    Special Requests      BOTTLES DRAWN AEROBIC AND ANAEROBIC Blood Culture results may not be optimal due to an inadequate volume of  blood received in culture bottles   Culture      NO GROWTH < 12 HOURS Performed at [facility], [address] [place], [place] [address]    Report Status PENDING   Blood Culture (routine x 2)     Status: None (Preliminary result)   Collection Time: 09/06/23 11:22 PM   Specimen: BLOOD LEFT HAND  Result Value Ref Range   Specimen Description BLOOD LEFT HAND    Special Requests      BOTTLES DRAWN AEROBIC ONLY Blood Culture adequate volume   Culture      NO GROWTH < 12 HOURS Performed at [facility], [address], [place], [place] [address]    Report Status PENDING   Troponin I (High Sensitivity)     Status: Abnormal   Collection Time: 09/06/23 11:55 PM  Result Value Ref Range   Troponin I (High Sensitivity) 23 (H) <18 ng/L  CK     Status: None   Collection Time: 09/06/23 11:55 PM  Result Value Ref Range   Total CK 74 49 - 397 U/L  Ethanol     Status: None   Collection Time: 09/06/23 11:57 PM  Result Value Ref Range   Alcohol, Ethyl (B) <10 <10 mg/dL  I-Stat Lactic Acid, ED     Status: None   Collection Time: 09/07/23 12:07 AM  Result Value Ref Range   Lactic Acid, Venous 1.3 0.5 - 1.9 mmol/L  Urinalysis, w/ Reflex to Culture (Infection Suspected) -Urine, Clean Catch     Status: Abnormal  Collection Time: 09/07/23 12:39 AM  Result Value Ref Range   Specimen Source URINE, CLEAN CATCH    Color, Urine YELLOW YELLOW   APPearance CLEAR CLEAR   Specific Gravity, Urine 1.032 (H) 1.005 - 1.030   pH 5.0 5.0 - 8.0   Glucose, UA NEGATIVE NEGATIVE mg/dL   Hgb urine dipstick NEGATIVE NEGATIVE   Bilirubin Urine NEGATIVE NEGATIVE   Ketones, ur 5 (A) NEGATIVE mg/dL   Protein, ur 30 (A) NEGATIVE mg/dL   Nitrite NEGATIVE NEGATIVE   Leukocytes,Ua NEGATIVE NEGATIVE   RBC / HPF 0-5 0 - 5 RBC/hpf   WBC, UA 0-5 0 - 5 WBC/hpf   Bacteria, UA NONE SEEN NONE SEEN   Squamous Epithelial / HPF 0-5 0 - 5 /HPF   Mucus PRESENT    Hyaline Casts, UA PRESENT   Acetaminophen level      Status: Abnormal   Collection Time: 09/07/23  2:00 AM  Result Value Ref Range   Acetaminophen (Tylenol), Serum 152 (HH) 10 - 30 ug/mL  Troponin I (High Sensitivity)     Status: Abnormal   Collection Time: 09/07/23  2:14 AM  Result Value Ref Range   Troponin I (High Sensitivity) 26 (H) <18 ng/L  I-Stat CG4 Lactic Acid, ED     Status: None   Collection Time: 09/07/23  2:27 AM  Result Value Ref Range   Lactic Acid, Venous 0.6 0.5 - 1.9 mmol/L  I-Stat venous blood gas, ([facility], [facility], [facility])     Status: Abnormal   Collection Time: 09/07/23  2:28 AM  Result Value Ref Range   pH, Ven 7.149 (LL) 7.25 - 7.43   pCO2, Ven 19.0 (LL) 44 - 60 mmHg   pO2, Ven 94 (H) 32 - 45 mmHg   Bicarbonate 6.6 (L) 20.0 - 28.0 mmol/L   TCO2 7 (L) 22 - 32 mmol/L   O2 Saturation 95 %   Acid-base deficit 20.0 (H) 0.0 - 2.0 mmol/L   Sodium 138 135 - 145 mmol/L   Potassium 4.4 3.5 - 5.1 mmol/L   Calcium, Ion 1.21 1.15 - 1.40 mmol/L   HCT 35.0 (L) 39.0 - 52.0 %   Hemoglobin 11.9 (L) 13.0 - 17.0 g/dL   Sample type VENOUS    Comment NOTIFIED PHYSICIAN   CBC     Status: Abnormal   Collection Time: 09/07/23  4:28 AM  Result Value Ref Range   WBC 10.9 (H) 4.0 - 10.5 K/uL   RBC 3.36 (L) 4.22 - 5.81 MIL/uL   Hemoglobin 12.2 (L) 13.0 - 17.0 g/dL   HCT 09.8 (L) 11.9 - 14.7 %   MCV 110.4 (H) 80.0 - 100.0 fL   MCH 36.3 (H) 26.0 - 34.0 pg   MCHC 32.9 30.0 - 36.0 g/dL   RDW 82.9 (H) 56.2 - 13.0 %   Platelets 171 150 - 400 K/uL   nRBC 0.0 0.0 - 0.2 %  Comprehensive metabolic panel     Status: Abnormal   Collection Time: 09/07/23  4:28 AM  Result Value Ref Range   Sodium 141 135 - 145 mmol/L   Potassium 3.9 3.5 - 5.1 mmol/L   Chloride 111 98 - 111 mmol/L   CO2 9 (L) 22 - 32 mmol/L   Glucose, Bld 95 70 - 99 mg/dL   BUN 22 8 - 23 mg/dL   Creatinine, Ser 8.65 (H) 0.61 - 1.24 mg/dL   Calcium 7.9 (L) 8.9 - 10.3 mg/dL   Total Protein 4.8 (L) 6.5 - 8.1  g/dL   Albumin 2.4 (L) 3.5 - 5.0 g/dL   AST 027 (H) 15 - 41 U/L   ALT  769 (H) 0 - 44 U/L   Alkaline Phosphatase 61 38 - 126 U/L   Total Bilirubin 1.2 0.0 - 1.2 mg/dL   GFR, Estimated 28 (L) >60 mL/min   Anion gap 21 (H) 5 - 15  Magnesium     Status: None   Collection Time: 09/07/23  4:28 AM  Result Value Ref Range   Magnesium 2.1 1.7 - 2.4 mg/dL  Phosphorus     Status: Abnormal   Collection Time: 09/07/23  4:28 AM  Result Value Ref Range   Phosphorus 4.7 (H) 2.5 - 4.6 mg/dL  Protime-INR     Status: Abnormal   Collection Time: 09/07/23  4:28 AM  Result Value Ref Range   Prothrombin Time 20.0 (H) 11.4 - 15.2 seconds   INR 1.7 (H) 0.8 - 1.2  Salicylate level     Status: Abnormal   Collection Time: 09/07/23  4:28 AM  Result Value Ref Range   Salicylate Lvl <7.0 (L) 7.0 - 30.0 mg/dL  Acetaminophen level     Status: Abnormal   Collection Time: 09/07/23  4:28 AM  Result Value Ref Range   Acetaminophen (Tylenol), Serum 81 (H) 10 - 30 ug/mL  Blood gas, venous     Status: Abnormal   Collection Time: 09/07/23  6:37 AM  Result Value Ref Range   pH, Ven 7.25 7.25 - 7.43   pCO2, Ven 22 (L) 44 - 60 mmHg   pO2, Ven <31 (LL) 32 - 45 mmHg   Bicarbonate 10.2 (L) 20.0 - 28.0 mmol/L   Acid-base deficit 15.8 (H) 0.0 - 2.0 mmol/L   O2 Saturation 46.8 %   Patient temperature 34.4    Collection site BLOOD LEFT FOREARM    Drawn by DRAWN BY RN    *Note: Due to a large number of results and/or encounters for the requested time period, some results have not been displayed. A complete set of results can be found in Results Review.    I have reviewed pertinent nursing notes, vitals, labs, and images as necessary. I have ordered labwork to follow up on as indicated.  I have reviewed the last notes from staff over past 24 hours. I have discussed patient's care plan and test results with nursing staff, CM/SW, and other staff as appropriate.  Time spent: Greater than 50% of the 55 minute visit was spent in counseling/coordination of care for the patient as laid out in the  A&P.   LOS: 0 days   Lewie Chamber, MD [facility] 09/07/2023, 1:23 PM

## 2023-09-07 NOTE — Progress Notes (Signed)
Antibiotic to be sent from phamracy.

## 2023-09-07 NOTE — Assessment & Plan Note (Signed)
-   Last A1c 5.8% on 07/15/2023 - Continue diet control for now

## 2023-09-07 NOTE — Assessment & Plan Note (Signed)
-   Hypotensive on admission.  Meds held on admission

## 2023-09-07 NOTE — Assessment & Plan Note (Signed)
Positive orthostatic vital signs Initially hypotensive, responded well to IV fluid. Add midodrine 5 mg twice daily; will wean as able Fall precautions PT OT assessment

## 2023-09-07 NOTE — Assessment & Plan Note (Signed)
-   Continue on plain oxycodone

## 2023-09-08 ENCOUNTER — Ambulatory Visit: Payer: Medicare PPO | Admitting: Nurse Practitioner

## 2023-09-08 ENCOUNTER — Other Ambulatory Visit: Payer: Self-pay | Admitting: Neurology

## 2023-09-08 DIAGNOSIS — T391X1A Poisoning by 4-Aminophenol derivatives, accidental (unintentional), initial encounter: Secondary | ICD-10-CM | POA: Diagnosis not present

## 2023-09-08 DIAGNOSIS — N179 Acute kidney failure, unspecified: Secondary | ICD-10-CM | POA: Diagnosis not present

## 2023-09-08 DIAGNOSIS — N1831 Chronic kidney disease, stage 3a: Secondary | ICD-10-CM | POA: Diagnosis not present

## 2023-09-08 DIAGNOSIS — I951 Orthostatic hypotension: Secondary | ICD-10-CM

## 2023-09-08 HISTORY — DX: Orthostatic hypotension: I95.1

## 2023-09-08 LAB — CBC WITH DIFFERENTIAL/PLATELET
Abs Immature Granulocytes: 0.03 10*3/uL (ref 0.00–0.07)
Basophils Absolute: 0 10*3/uL (ref 0.0–0.1)
Basophils Relative: 0 %
Eosinophils Absolute: 0.1 10*3/uL (ref 0.0–0.5)
Eosinophils Relative: 2 %
HCT: 36.6 % — ABNORMAL LOW (ref 39.0–52.0)
Hemoglobin: 12.4 g/dL — ABNORMAL LOW (ref 13.0–17.0)
Immature Granulocytes: 0 %
Lymphocytes Relative: 19 %
Lymphs Abs: 1.6 10*3/uL (ref 0.7–4.0)
MCH: 36.6 pg — ABNORMAL HIGH (ref 26.0–34.0)
MCHC: 33.9 g/dL (ref 30.0–36.0)
MCV: 108 fL — ABNORMAL HIGH (ref 80.0–100.0)
Monocytes Absolute: 0.6 10*3/uL (ref 0.1–1.0)
Monocytes Relative: 7 %
Neutro Abs: 6.2 10*3/uL (ref 1.7–7.7)
Neutrophils Relative %: 72 %
Platelets: 137 10*3/uL — ABNORMAL LOW (ref 150–400)
RBC: 3.39 MIL/uL — ABNORMAL LOW (ref 4.22–5.81)
RDW: 16 % — ABNORMAL HIGH (ref 11.5–15.5)
WBC: 8.6 10*3/uL (ref 4.0–10.5)
nRBC: 0 % (ref 0.0–0.2)

## 2023-09-08 LAB — COMPREHENSIVE METABOLIC PANEL
ALT: 387 U/L — ABNORMAL HIGH (ref 0–44)
ALT: 309 U/L — ABNORMAL HIGH (ref 0–44)
AST: 121 U/L — ABNORMAL HIGH (ref 15–41)
AST: 100 U/L — ABNORMAL HIGH (ref 15–41)
Albumin: 2.2 g/dL — ABNORMAL LOW (ref 3.5–5.0)
Albumin: 2.1 g/dL — ABNORMAL LOW (ref 3.5–5.0)
Alkaline Phosphatase: 54 U/L (ref 38–126)
Alkaline Phosphatase: 62 U/L (ref 38–126)
Anion gap: 15 (ref 5–15)
Anion gap: 9 (ref 5–15)
BUN: 12 mg/dL (ref 8–23)
BUN: 13 mg/dL (ref 8–23)
CO2: 15 mmol/L — ABNORMAL LOW (ref 22–32)
CO2: 16 mmol/L — ABNORMAL LOW (ref 22–32)
Calcium: 8.5 mg/dL — ABNORMAL LOW (ref 8.9–10.3)
Calcium: 8.7 mg/dL — ABNORMAL LOW (ref 8.9–10.3)
Chloride: 110 mmol/L (ref 98–111)
Chloride: 115 mmol/L — ABNORMAL HIGH (ref 98–111)
Creatinine, Ser: 1.51 mg/dL — ABNORMAL HIGH (ref 0.61–1.24)
Creatinine, Ser: 1.32 mg/dL — ABNORMAL HIGH (ref 0.61–1.24)
GFR, Estimated: 49 mL/min — ABNORMAL LOW (ref >60)
GFR, Estimated: 57 mL/min — ABNORMAL LOW (ref >60)
Glucose, Bld: 88 mg/dL (ref 70–99)
Glucose, Bld: 138 mg/dL — ABNORMAL HIGH (ref 70–99)
Potassium: 2.5 mmol/L — CL (ref 3.5–5.1)
Potassium: 3.1 mmol/L — ABNORMAL LOW (ref 3.5–5.1)
Sodium: 140 mmol/L (ref 135–145)
Sodium: 140 mmol/L (ref 135–145)
Total Bilirubin: 1 mg/dL (ref 0.0–1.2)
Total Bilirubin: 0.7 mg/dL (ref 0.0–1.2)
Total Protein: 4.8 g/dL — ABNORMAL LOW (ref 6.5–8.1)
Total Protein: 4.7 g/dL — ABNORMAL LOW (ref 6.5–8.1)

## 2023-09-08 LAB — MAGNESIUM: Magnesium: 1.7 mg/dL (ref 1.7–2.4)

## 2023-09-08 LAB — ACETAMINOPHEN LEVEL: Acetaminophen (Tylenol), Serum: <10 ug/mL — ABNORMAL LOW (ref 10–30)

## 2023-09-08 MED ORDER — MAGNESIUM SULFATE 2 GM/50ML IV SOLN
2.0000 g | Freq: Once | INTRAVENOUS | Status: AC
  Administered 2023-09-08: 2 g via INTRAVENOUS
  Filled 2023-09-08: qty 50

## 2023-09-08 MED ORDER — POTASSIUM CHLORIDE CRYS ER 20 MEQ PO TBCR
40.0000 meq | EXTENDED_RELEASE_TABLET | ORAL | Status: AC
  Administered 2023-09-08 (×3): 40 meq via ORAL
  Filled 2023-09-08 (×3): qty 2

## 2023-09-08 MED ORDER — METHOCARBAMOL 500 MG PO TABS
500.0000 mg | ORAL_TABLET | Freq: Three times a day (TID) | ORAL | Status: DC | PRN
  Administered 2023-09-08 – 2023-09-09 (×2): 500 mg via ORAL
  Filled 2023-09-08 (×2): qty 1

## 2023-09-08 NOTE — Progress Notes (Signed)
RN received a call from poison control inquiring about pt's current labs. After speaking with RN, the poison controlled representative requested for Tylenol level, another chemistry with LFT and PT/INR labs to be drawn today either now or tonight. Dr. Frederick Peers notified. Dionne Bucy RN

## 2023-09-08 NOTE — Progress Notes (Signed)
Brief Pharmacy Note  RN reports that poison control called her and recommended d/c'ing [facility] given updated labs this evening (APAP <10, LFTs trending down).  NA[facility] has been d/c'd.  Vernard Gambles, PharmD, BCPS 09/08/2023 10:56 PM

## 2023-09-08 NOTE — Evaluation (Signed)
Occupational Therapy Evaluation Patient Details Name: Justin Patel MRN: [id number] DOB: 1950-10-01 Today's Date: 09/08/2023   History of Present Illness 73 yo male presents to ED on 1/28 with weakness, poor oral intake, dizziness s/p syncope. Workup for syncope and chronic tylenol toxicity. PMH includes  HFpEF, history of morbid obesity status post gastric bypass surgery in 2010, DMII, orthostatic hypotension, hypertension, hyperlipidemia, chronic anxiety/depression, degenerative disc disease with previous back surgery, chronic back pain, GERD.   Clinical Impression   Pt c/o mild-mod pain to RUE, back, swelling noted to proximal RUE. Pt lives at home with wife, states PLOF independent, cane out in community. Pt currently displays decreased safety awareness, poor problem solving, significant dizziness sitting/standing, VSS. Pt answered orientation questions but repeatedly made comments as if he were at his house or his wife was in the other room. Pt was able to complete ADLs sitting in recliner with min A, stood for 30 seconds before needing to sit back down. Pt would benefit from continued acute care to maximize functional activity tolerance and safety awareness, HHOT recommended upon DC. Pt states he has all DME needed to remain safe in home setting.        If plan is discharge home, recommend the following: A little help with walking and/or transfers;A little help with bathing/dressing/bathroom;Assistance with cooking/housework;Assist for transportation;Help with stairs or ramp for entrance    Functional Status Assessment  Patient has had a recent decline in their functional status and demonstrates the ability to make significant improvements in function in a reasonable and predictable amount of time.  Equipment Recommendations  None recommended by OT    Recommendations for Other Services       Precautions / Restrictions Precautions Precautions: Fall Precaution Comments:  BP Restrictions Weight Bearing Restrictions Per Provider Order: No      Mobility Bed Mobility Overal bed mobility: Needs Assistance             General bed mobility comments: arrived in recliner    Transfers Overall transfer level: Needs assistance Equipment used: Rolling walker (2 wheels) Transfers: Sit to/from Stand Sit to Stand: Min assist           General transfer comment: min A for safety, dizzy, poor safety awareness      Balance Overall balance assessment: Needs assistance Sitting-balance support: No upper extremity supported, Feet supported Sitting balance-Leahy Scale: Fair     Standing balance support: During functional activity, Bilateral upper extremity supported Standing balance-Leahy Scale: Fair Standing balance comment: requires RW due to dizziness and poor safety awareness                           ADL either performed or assessed with clinical judgement   ADL Overall ADL's : Needs assistance/impaired Eating/Feeding: Independent   Grooming: Set up;Sitting   Upper Body Bathing: Minimal assistance;Sitting   Lower Body Bathing: Minimal assistance;Sitting/lateral leans   Upper Body Dressing : Minimal assistance;Sitting   Lower Body Dressing: Minimal assistance;Sit to/from stand   Toilet Transfer: Minimal assistance;BSC/3in1;Rolling walker (2 wheels)   Toileting- Clothing Manipulation and Hygiene: Set up;Supervision/safety;Sitting/lateral lean       Functional mobility during ADLs: Minimal assistance General ADL Comments: Pt min A for most ADLs, poor safety awareness, significant dizziness with sitting/standing, BP known to drop but was stable during session.     Vision Baseline Vision/History: 1 Wears glasses Ability to See in Adequate Light: 0 Adequate Patient Visual Report: No change from  baseline       Perception         Praxis         Pertinent Vitals/Pain Pain Assessment Pain Assessment: 0-10 Pain Score: 5   Faces Pain Scale: Hurts little more Pain Location: back, chronic Pain Descriptors / Indicators: Discomfort, Grimacing, Guarding Pain Intervention(s): Monitored during session     Extremity/Trunk Assessment Upper Extremity Assessment Upper Extremity Assessment: Generalized weakness;RUE deficits/detail RUE Deficits / Details: swelling/pain to proximal RUE RUE Sensation: WNL RUE Coordination: decreased gross motor   Lower Extremity Assessment Lower Extremity Assessment: Defer to PT evaluation   Cervical / Trunk Assessment Cervical / Trunk Assessment: Kyphotic;Other exceptions Cervical / Trunk Exceptions: history of multiple back surgeries   Communication Communication Communication: No apparent difficulties Cueing Techniques: Verbal cues;Gestural cues   Cognition Arousal: Alert Behavior During Therapy: WFL for tasks assessed/performed Overall Cognitive Status: Impaired/Different from baseline Area of Impairment: Orientation, Safety/judgement, Following commands                 Orientation Level: Disoriented to, Place     Following Commands: Follows one step commands with increased time Safety/Judgement: Decreased awareness of safety     General Comments: Pt answered all orientation questions, but repeatedly thought he was in his home throughout session, forgot that IV was in him multiple times asking what was attached to him, poor problem solving and safety awareness. Pt couldn't find urinal and urinated into trash can, call bell was on the floor.     General Comments  orthostatic BP in previous note    Exercises     Shoulder Instructions      Home Living Family/patient expects to be discharged to:: Private residence Living Arrangements: Spouse/significant other Available Help at Discharge: Family;Available PRN/intermittently Type of Home: House Home Access: Stairs to enter [employer] of Steps: 3 Entrance Stairs-Rails: Right;Left Home Layout:  One level     Bathroom Shower/Tub: Producer, television/film/video: Standard     Home Equipment: Cane - single point;Wheelchair - Forensic psychologist (2 wheels);Grab bars - tub/shower;Grab bars - toilet;Shower seat   Additional Comments: Pt reports living at home with wife who is available 24/7      Prior Functioning/Environment Prior Level of Function : Independent/Modified Independent             Mobility Comments: uses cane in community only          OT Problem List: Decreased strength;Decreased activity tolerance;Impaired balance (sitting and/or standing);Decreased cognition;Decreased safety awareness;Pain      OT Treatment/Interventions: Self-care/ADL training;Therapeutic exercise;Energy conservation;DME and/or AE instruction;Therapeutic activities;Patient/family education;Balance training    OT Goals(Current goals can be found in the care plan section) Acute Rehab OT Goals Patient Stated Goal: to improve balance OT Goal Formulation: With patient Time For Goal Achievement: 09/22/23 Potential to Achieve Goals: Good  OT Frequency: Min 1X/week    Co-evaluation              AM-PAC OT "6 Clicks" Daily Activity     Outcome Measure Help from another person eating meals?: None Help from another person taking care of personal grooming?: A Little Help from another person toileting, which includes using toliet, bedpan, or urinal?: A Little Help from another person bathing (including washing, rinsing, drying)?: A Little Help from another person to put on and taking off regular upper body clothing?: A Little Help from another person to put on and taking off regular lower body clothing?: A Little 6 Click Score: 19  End of Session Equipment Utilized During Treatment: Gait belt;Rolling walker (2 wheels) Nurse Communication: Mobility status  Activity Tolerance: Patient limited by fatigue Patient left: in chair;with call bell/phone within reach;with chair alarm  set  OT Visit Diagnosis: Unsteadiness on feet (R26.81);Other abnormalities of gait and mobility (R26.89);Repeated falls (R29.6);Muscle weakness (generalized) (M62.81);Pain;Other symptoms and signs involving cognitive function Pain - Right/Left: Right Pain - part of body: Arm                Time: 6213-0865 OT Time Calculation (min): 31 min Charges:  OT General Charges $OT Visit: 1 Visit OT Evaluation $OT Eval Moderate Complexity: 1 Mod OT Treatments $Self Care/Home Management : 8-22 mins  [address] OTR/L   Alexis Goodell 09/08/2023, 2:09 PM

## 2023-09-08 NOTE — Progress Notes (Signed)
   09/08/23 1100  Orthostatic Lying   BP- Lying 120/73  Pulse- Lying 93  Orthostatic Sitting  BP- Sitting 118/88  Pulse- Sitting 104  Orthostatic Standing at 0 minutes  BP- Standing at 0 minutes 96/63 (100/81, 106 bpm after return to sitting)  Pulse- Standing at 0 minutes 115  Orthostatic Standing at 3 minutes  BP- Standing at 3 minutes 107/74 (return to supine, 6 minutes post-stand)  Pulse- Standing at 3 minutes 113   Symptomatic hypotension with standing at 0 minutes, including "wooziness", eye closing, pallor.  Marye Round, PT DPT Acute Rehabilitation Services Secure Chat Preferred  Office [phone]

## 2023-09-08 NOTE — Progress Notes (Signed)
Progress Note    Justin Patel   ZOX:[id number]  DOB: 1951/05/14  DOA: 09/06/2023     1 PCP: [name], Justin Patel  Initial CC: syncope  Hospital Course: Justin Patel is a 73 y.o. male with medical history significant for chronic HFpEF, history of morbid obesity status post gastric bypass surgery in 2010, type 2 diabetes, orthostatic hypotension, hypertension, hyperlipidemia, chronic anxiety/depression, degenerative disc disease with previous back surgery, chronic back pain, GERD, who presented to the ER via EMS after 2 episodes of syncope in the last 2 days.   He states he has been taking increased amounts of Tylenol at home.  He endorsed taking approximately 2 tablets of arthritis Tylenol and 3 tablets of extra strength in the morning and again at night.  He denies taking any of his Percocet. In the ER he was found to be hypotensive with positive orthostatics.  He also had severe anion gap metabolic acidosis, elevated lactic acid, and elevated LFTs. Initial Tylenol level returned at 152.  He was started on N-acetylcysteine and fomepizole. Poison control was also informed.  Interval History:  No events overnight.  Feeling better overall it seems.  Wife present bedside this afternoon. Was still positive during orthostatic check.  Assessment and Plan: * Syncope-resolved as of 09/07/2023 Positive orthostatic vital signs Initially hypotensive, responded well to IV fluid. Add midodrine 5 mg twice daily; will wean as able Fall precautions PT OT assessment  Tylenol overdose, accidental or unintentional, initial encounter - Unintentional but has been taking 5 tablets of Tylenol twice a day.  2 of them are arthritis strength and 3 of them are extra strength -Level 152 on admission -Poison control following - Received fomepizole - Continue N-acetylcysteine protocol -Trend LFTs  Acute renal failure superimposed on stage 3a chronic kidney disease (HCC) - patient has history of  CKD3a. Baseline creat ~ 1.5, eGFR~ 50 - patient presents with increase in creat >0.3 mg/dL above baseline or creat increase >1.5x baseline presumed to have occurred within past 7 days PTA - creat 2.31 on admission - likely some volume depletion and contribution from tylenol OD -Improving; continue encouraging oral intake   Orthostatic hypotension - positive again with PT on 1/30 -Continue midodrine -Overall pressures are improving  Otitis media - left ear noted with bulging TM, decreased light reflex and decreased hearing  - start on Augmentin and monitor response - hold off on otic drops for now given allergies but can attempt if still needed  Transaminitis - Insetting of Tylenol overdose - Continue trending LFTs  Chronic pain syndrome - Continue on plain oxycodone  BPH (benign prostatic hyperplasia) - No home meds noted  Hyperlipidemia - Hold Crestor for now  Essential hypertension, benign - Hypotensive on admission.  Meds held on admission  Type 2 diabetes mellitus (HCC) - Last A1c 5.8% on 07/15/2023 - Continue diet control for now   Old records reviewed in assessment of this patient  Antimicrobials: Augmentin 1/29 >> current   DVT prophylaxis:  SCDs Start: 09/07/23 0310   Code Status:   Code Status: Full Code  Mobility Assessment (Last 72 Hours)     Mobility Assessment     Row Name 09/08/23 1359 09/08/23 1344 09/08/23 0750 09/07/23 1937 09/07/23 1517   Does patient have an order for bedrest or is patient medically unstable -- -- No - Continue assessment Yes- Bedfast (Level 1) - Complete No - Continue assessment   What is the highest level of mobility based on the progressive  mobility assessment? Level 3 (Stands with assist) - Balance while standing  and cannot march in place Level 4 (Walks with assist in room) - Balance while marching in place and cannot step forward and back - Complete Level 4 (Walks with assist in room) - Balance while marching in place and  cannot step forward and back - Complete Level 4 (Walks with assist in room) - Balance while marching in place and cannot step forward and back - Complete Level 4 (Walks with assist in room) - Balance while marching in place and cannot step forward and back - Complete            Barriers to discharge: none Disposition Plan:  Home HH orders placed: TBD Status is: Inpt  Objective: Blood pressure (!) 160/87, pulse 97, temperature 97.7 F (36.5 C), temperature source Oral, resp. rate (!) 21, height 5\' 6"  (1.676 m), weight 70.3 kg, SpO2 97%.  Examination:  Physical Exam Constitutional:      General: He is not in acute distress.    Appearance: Normal appearance.  HENT:     Head: Normocephalic and atraumatic.     Left Ear: Drainage and swelling present. A middle ear effusion is present. There is impacted cerumen. Tympanic membrane is bulging.     Mouth/Throat:     Mouth: Mucous membranes are moist.  Eyes:     Extraocular Movements: Extraocular movements intact.  Cardiovascular:     Rate and Rhythm: Normal rate and regular rhythm.  Pulmonary:     Effort: Pulmonary effort is normal. No respiratory distress.     Breath sounds: Normal breath sounds. No wheezing.  Abdominal:     General: Bowel sounds are normal. There is no distension.     Palpations: Abdomen is soft.     Tenderness: There is no abdominal tenderness.  Musculoskeletal:        General: Normal range of motion.     Cervical back: Normal range of motion and neck supple.  Skin:    General: Skin is warm and dry.  Neurological:     General: No focal deficit present.     Mental Status: He is alert.  Psychiatric:        Mood and Affect: Mood normal.        Behavior: Behavior normal.      Consultants:    Procedures:    Data Reviewed: Results for orders placed or performed during the hospital encounter of 09/06/23 (from the past 24 hours)  CBC with Differential/Platelet     Status: Abnormal   Collection Time:  09/08/23  4:28 AM  Result Value Ref Range   WBC 8.6 4.0 - 10.5 K/uL   RBC 3.39 (L) 4.22 - 5.81 MIL/uL   Hemoglobin 12.4 (L) 13.0 - 17.0 g/dL   HCT 41.6 (L) 60.6 - 30.1 %   MCV 108.0 (H) 80.0 - 100.0 fL   MCH 36.6 (H) 26.0 - 34.0 pg   MCHC 33.9 30.0 - 36.0 g/dL   RDW 60.1 (H) 09.3 - 23.5 %   Platelets 137 (L) 150 - 400 K/uL   nRBC 0.0 0.0 - 0.2 %   Neutrophils Relative % 72 %   Neutro Abs 6.2 1.7 - 7.7 K/uL   Lymphocytes Relative 19 %   Lymphs Abs 1.6 0.7 - 4.0 K/uL   Monocytes Relative 7 %   Monocytes Absolute 0.6 0.1 - 1.0 K/uL   Eosinophils Relative 2 %   Eosinophils Absolute 0.1 0.0 - 0.5 K/uL  Basophils Relative 0 %   Basophils Absolute 0.0 0.0 - 0.1 K/uL   Immature Granulocytes 0 %   Abs Immature Granulocytes 0.03 0.00 - 0.07 K/uL  Comprehensive metabolic panel     Status: Abnormal   Collection Time: 09/08/23 10:17 AM  Result Value Ref Range   Sodium 140 135 - 145 mmol/L   Potassium 2.5 (LL) 3.5 - 5.1 mmol/L   Chloride 110 98 - 111 mmol/L   CO2 15 (L) 22 - 32 mmol/L   Glucose, Bld 88 70 - 99 mg/dL   BUN 12 8 - 23 mg/dL   Creatinine, Ser 0.27 (H) 0.61 - 1.24 mg/dL   Calcium 8.5 (L) 8.9 - 10.3 mg/dL   Total Protein 4.8 (L) 6.5 - 8.1 g/dL   Albumin 2.2 (L) 3.5 - 5.0 g/dL   AST 253 (H) 15 - 41 U/L   ALT 387 (H) 0 - 44 U/L   Alkaline Phosphatase 54 38 - 126 U/L   Total Bilirubin 1.0 0.0 - 1.2 mg/dL   GFR, Estimated 49 (L) >60 mL/min   Anion gap 15 5 - 15  Magnesium     Status: None   Collection Time: 09/08/23 10:17 AM  Result Value Ref Range   Magnesium 1.7 1.7 - 2.4 mg/dL   *Note: Due to a large number of results and/or encounters for the requested time period, some results have not been displayed. A complete set of results can be found in Results Review.    I have reviewed pertinent nursing notes, vitals, labs, and images as necessary. I have ordered labwork to follow up on as indicated.  I have reviewed the last notes from staff over past 24 hours. I have  discussed patient's care plan and test results with nursing staff, CM/SW, and other staff as appropriate.  Time spent: Greater than 50% of the 55 minute visit was spent in counseling/coordination of care for the patient as laid out in the A&P.   LOS: 1 day   Justin Chamber, Justin Patel [facility] 09/08/2023, 5:27 PM

## 2023-09-08 NOTE — Assessment & Plan Note (Addendum)
-   Still some positive orthostatics but recovers quickly after standing -Overall blood pressure has improved; BP meds held for a few more days at discharge

## 2023-09-08 NOTE — Progress Notes (Signed)
   09/08/23 1300  Provider Notification  Provider Name/Title Lewie Chamber, MD  Date Provider Notified 09/08/23  Time Provider Notified 1241  Method of Notification Page  Notification Reason Critical Result  Test performed and critical result BMP / potassium 2.5  Date Critical Result Received 09/08/23  Time Critical Result Received 1239  Provider response See new orders  Date of Provider Response 09/08/23  Time of Provider Response 1258

## 2023-09-08 NOTE — Evaluation (Signed)
Physical Therapy Evaluation Patient Details Name: Justin Patel MRN: [id number] DOB: Apr 27, 1951 Today's Date: 09/08/2023  History of Present Illness  73 yo male presents to ED on 1/28 with weakness, poor oral intake, dizziness s/p syncope. Workup for syncope and chronic tylenol toxicity. PMH includes  HFpEF, history of morbid obesity status post gastric bypass surgery in 2010, DMII, orthostatic hypotension, hypertension, hyperlipidemia, chronic anxiety/depression, degenerative disc disease with previous back surgery, chronic back pain, GERD.  Clinical Impression   Pt presents with chronic back pain, periods of AMS with disorientation during session, impaired activity tolerance, poor balance with history of falls, and generalized weakness. Pt to benefit from acute PT to address deficits. Pt ambulated short room distance, pt limited by symptomatic orthostatic hypotension. PT anticipates good progress with improvement in BP response to activity. Pt would benefit from ted hose, abdominal binder if BP response to activity does not improve. PT to progress mobility as tolerated, and will continue to follow acutely.          If plan is discharge home, recommend the following: A little help with walking and/or transfers;A little help with bathing/dressing/bathroom   Can travel by private vehicle        Equipment Recommendations None recommended by PT  Recommendations for Other Services       Functional Status Assessment Patient has had a recent decline in their functional status and demonstrates the ability to make significant improvements in function in a reasonable and predictable amount of time.     Precautions / Restrictions Precautions Precautions: Fall Precaution Comments: BP Restrictions Weight Bearing Restrictions Per Provider Order: No      Mobility  Bed Mobility Overal bed mobility: Needs Assistance Bed Mobility: Supine to Sit, Sit to Supine     Supine to sit: Supervision,  HOB elevated, Used rails Sit to supine: Supervision, HOB elevated, Used rails   General bed mobility comments: use of HOB elevation, increased time    Transfers Overall transfer level: Needs assistance Equipment used: Rolling walker (2 wheels) Transfers: Sit to/from Stand Sit to Stand: Min assist           General transfer comment: light rise and steady assist, stand x2 from EOB.    Ambulation/Gait Ambulation/Gait assistance: Contact guard assist Gait Distance (Feet): 15 Feet Assistive device: Rolling walker (2 wheels) Gait Pattern/deviations: Step-through pattern, Decreased stride length, Trunk flexed Gait velocity: decr     General Gait Details: close guard for safety, cuse for close proximity of RW. Pt with worsening "wooziness", returned to bed  Stairs            Wheelchair Mobility     Tilt Bed    Modified Rankin (Stroke Patients Only)       Balance Overall balance assessment: Needs assistance Sitting-balance support: No upper extremity supported, Feet supported Sitting balance-Leahy Scale: Fair     Standing balance support: During functional activity, Bilateral upper extremity supported Standing balance-Leahy Scale: Fair                               Pertinent Vitals/Pain Pain Assessment Pain Assessment: Faces Faces Pain Scale: Hurts little more Pain Location: back, chronic Pain Descriptors / Indicators: Discomfort, Grimacing, Guarding Pain Intervention(s): Limited activity within patient's tolerance, Monitored during session, Patient requesting pain meds-RN notified    Home Living Family/patient expects to be discharged to:: Private residence Living Arrangements: Spouse/significant other Available Help at Discharge: Family;Available PRN/intermittently Type of  Home: House Home Access: Stairs to enter Entrance Stairs-Rails: Doctor, general practice of Steps: 3   Home Layout: One level Home Equipment: Cane - single  point;Wheelchair - Forensic psychologist (2 wheels);Grab bars - tub/shower;Grab bars - toilet;Shower seat      Prior Function Prior Level of Function : Independent/Modified Independent             Mobility Comments: uses cane in community only       Extremity/Trunk Assessment   Upper Extremity Assessment Upper Extremity Assessment: Defer to OT evaluation    Lower Extremity Assessment Lower Extremity Assessment: Generalized weakness    Cervical / Trunk Assessment Cervical / Trunk Assessment: Kyphotic;Other exceptions Cervical / Trunk Exceptions: history of multiple back surgeries  Communication   Communication Communication: No apparent difficulties Cueing Techniques: Verbal cues;Gestural cues  Cognition Arousal: Alert Behavior During Therapy: WFL for tasks assessed/performed Overall Cognitive Status: Impaired/Different from baseline Area of Impairment: Orientation, Safety/judgement, Following commands                 Orientation Level: Disoriented to, Place     Following Commands: Follows one step commands with increased time Safety/Judgement: Decreased awareness of safety     General Comments: Pt states he is in [place], thinking he is at home off and on throughout session. Pt with confusion about lines/leads        General Comments General comments (skin integrity, edema, etc.): orthostatic BP in previous note    Exercises     Assessment/Plan    PT Assessment Patient needs continued PT services  PT Problem List Decreased strength;Decreased mobility;Decreased safety awareness;Decreased activity tolerance;Decreased knowledge of use of DME;Decreased balance;Pain;Cardiopulmonary status limiting activity       PT Treatment Interventions DME instruction;Therapeutic exercise;Gait training;Patient/family education;Therapeutic activities;Balance training;Stair training;Functional mobility training;Neuromuscular re-education    PT Goals (Current goals  can be found in the Care Plan section)  Acute Rehab PT Goals Patient Stated Goal: stop feeling dizzy PT Goal Formulation: With patient Time For Goal Achievement: 09/22/23 Potential to Achieve Goals: Good    Frequency Min 1X/week     Co-evaluation               AM-PAC PT "6 Clicks" Mobility  Outcome Measure Help needed turning from your back to your side while in a flat bed without using bedrails?: A Little Help needed moving from lying on your back to sitting on the side of a flat bed without using bedrails?: A Little Help needed moving to and from a bed to a chair (including a wheelchair)?: A Little Help needed standing up from a chair using your arms (e.g., wheelchair or bedside chair)?: A Little Help needed to walk in hospital room?: A Little Help needed climbing 3-5 steps with a railing? : A Little 6 Click Score: 18    End of Session   Activity Tolerance: Patient tolerated treatment well Patient left: in bed;with bed alarm set;with call bell/phone within reach Nurse Communication: Mobility status PT Visit Diagnosis: Other abnormalities of gait and mobility (R26.89);Muscle weakness (generalized) (M62.81)    Time: 1100-1130 PT Time Calculation (min) (ACUTE ONLY): 30 min   Charges:   PT Evaluation $PT Eval Low Complexity: 1 Low PT Treatments $Therapeutic Activity: 8-22 mins PT General Charges $$ ACUTE PT VISIT: 1 Visit         Marye Round, PT DPT Acute Rehabilitation Services Secure Chat Preferred  Office [phone]   Tykira Wachs E Christain [place] 09/08/2023, 1:46 PM

## 2023-09-08 NOTE — Plan of Care (Signed)

## 2023-09-08 NOTE — Progress Notes (Signed)
Poison control called and recommended that Acetylcysteine be discontinued. Provider notified stated that pharmacy be notified. Pharmacy was notified by this writer and medication was discontinued.

## 2023-09-09 DIAGNOSIS — N179 Acute kidney failure, unspecified: Secondary | ICD-10-CM | POA: Diagnosis not present

## 2023-09-09 DIAGNOSIS — H6690 Otitis media, unspecified, unspecified ear: Secondary | ICD-10-CM | POA: Diagnosis not present

## 2023-09-09 DIAGNOSIS — I951 Orthostatic hypotension: Secondary | ICD-10-CM | POA: Diagnosis not present

## 2023-09-09 DIAGNOSIS — T391X1A Poisoning by 4-Aminophenol derivatives, accidental (unintentional), initial encounter: Secondary | ICD-10-CM | POA: Diagnosis not present

## 2023-09-09 LAB — CBC WITH DIFFERENTIAL/PLATELET
Abs Immature Granulocytes: 0.01 10*3/uL (ref 0.00–0.07)
Basophils Absolute: 0 10*3/uL (ref 0.0–0.1)
Basophils Relative: 1 %
Eosinophils Absolute: 0.2 10*3/uL (ref 0.0–0.5)
Eosinophils Relative: 4 %
HCT: 31.4 % — ABNORMAL LOW (ref 39.0–52.0)
Hemoglobin: 10.8 g/dL — ABNORMAL LOW (ref 13.0–17.0)
Immature Granulocytes: 0 %
Lymphocytes Relative: 28 %
Lymphs Abs: 1.3 10*3/uL (ref 0.7–4.0)
MCH: 35.9 pg — ABNORMAL HIGH (ref 26.0–34.0)
MCHC: 34.4 g/dL (ref 30.0–36.0)
MCV: 104.3 fL — ABNORMAL HIGH (ref 80.0–100.0)
Monocytes Absolute: 0.4 10*3/uL (ref 0.1–1.0)
Monocytes Relative: 9 %
Neutro Abs: 2.9 10*3/uL (ref 1.7–7.7)
Neutrophils Relative %: 58 %
Platelets: 124 10*3/uL — ABNORMAL LOW (ref 150–400)
RBC: 3.01 MIL/uL — ABNORMAL LOW (ref 4.22–5.81)
RDW: 16.1 % — ABNORMAL HIGH (ref 11.5–15.5)
WBC: 4.8 10*3/uL (ref 4.0–10.5)
nRBC: 0 % (ref 0.0–0.2)

## 2023-09-09 LAB — PROTIME-INR
INR: 1.1 (ref 0.8–1.2)
Prothrombin Time: 14.2 s (ref 11.4–15.2)

## 2023-09-09 LAB — COMPREHENSIVE METABOLIC PANEL
ALT: 271 U/L — ABNORMAL HIGH (ref 0–44)
AST: 79 U/L — ABNORMAL HIGH (ref 15–41)
Albumin: 2.1 g/dL — ABNORMAL LOW (ref 3.5–5.0)
Alkaline Phosphatase: 57 U/L (ref 38–126)
Anion gap: 12 (ref 5–15)
BUN: 12 mg/dL (ref 8–23)
CO2: 16 mmol/L — ABNORMAL LOW (ref 22–32)
Calcium: 8.9 mg/dL (ref 8.9–10.3)
Chloride: 113 mmol/L — ABNORMAL HIGH (ref 98–111)
Creatinine, Ser: 1.15 mg/dL (ref 0.61–1.24)
GFR, Estimated: >60 mL/min (ref >60)
Glucose, Bld: 93 mg/dL (ref 70–99)
Potassium: 3.5 mmol/L (ref 3.5–5.1)
Sodium: 141 mmol/L (ref 135–145)
Total Bilirubin: 1 mg/dL (ref 0.0–1.2)
Total Protein: 4.8 g/dL — ABNORMAL LOW (ref 6.5–8.1)

## 2023-09-09 LAB — MAGNESIUM: Magnesium: 1.9 mg/dL (ref 1.7–2.4)

## 2023-09-09 MED ORDER — VALSARTAN 80 MG PO TABS: 80.0000 mg | ORAL_TABLET | Freq: Every evening | ORAL | Status: DC

## 2023-09-09 MED ORDER — AMLODIPINE BESYLATE 5 MG PO TABS: 5.0000 mg | ORAL_TABLET | Freq: Every day | ORAL | Status: DC

## 2023-09-09 MED ORDER — POTASSIUM CHLORIDE CRYS ER 20 MEQ PO TBCR
40.0000 meq | EXTENDED_RELEASE_TABLET | Freq: Once | ORAL | Status: AC
  Administered 2023-09-09: 40 meq via ORAL
  Filled 2023-09-09: qty 2

## 2023-09-09 MED ORDER — OXYCODONE-ACETAMINOPHEN 7.5-325 MG PO TABS: 1.0000 | ORAL_TABLET | Freq: Four times a day (QID) | ORAL | Status: DC | PRN

## 2023-09-09 MED ORDER — AMOXICILLIN-POT CLAVULANATE 500-125 MG PO TABS: 1.0000 | ORAL_TABLET | Freq: Two times a day (BID) | ORAL | 0 refills | Status: AC

## 2023-09-09 MED ORDER — ROSUVASTATIN CALCIUM 40 MG PO TABS: 40.0000 mg | ORAL_TABLET | Freq: Every day | ORAL | Status: DC

## 2023-09-09 MED ORDER — OXYCODONE HCL 10 MG PO TABS: 10.0000 mg | ORAL_TABLET | ORAL | 0 refills | Status: DC | PRN

## 2023-09-09 NOTE — Discharge Summary (Signed)
Physician Discharge Summary   Justin Patel BMW:[id number] DOB: Apr 16, 1951 DOA: 09/06/2023  PCP: [name], MD  Admit date: 09/06/2023 Discharge date: 09/09/2023   Admitted From: Home Disposition:  Home with HH Discharging physician: Lewie Chamber, MD Barriers to discharge: none  Recommendations at discharge: Repeat CMP within 1 week Repeat orthostatics; BP meds held until 2/4  Home Health: PT, OT  Discharge Condition: stable CODE STATUS: Full Diet recommendation:  Diet Orders (From admission, onward)     Start     Ordered   09/09/23 0000  Diet - low sodium heart healthy        09/09/23 0943   09/07/23 0318  Diet heart healthy/carb modified Room service appropriate? Yes; Fluid consistency: Thin  Diet effective now       Question Answer Comment  Diet-HS Snack? Nothing   Room service appropriate? Yes   Fluid consistency: Thin      09/07/23 0317            Hospital Course: Justin Patel is a 73 y.o. male with medical history significant for chronic HFpEF, history of morbid obesity status post gastric bypass surgery in 2010, type 2 diabetes, orthostatic hypotension, hypertension, hyperlipidemia, chronic anxiety/depression, degenerative disc disease with previous back surgery, chronic back pain, GERD, who presented to the ER via EMS after 2 episodes of syncope in the last 2 days.   He states he has been taking increased amounts of Tylenol at home.  He endorsed taking approximately 2 tablets of arthritis Tylenol and 3 tablets of extra strength in the morning and again at night.  He denies taking any of his Percocet. In the ER he was found to be hypotensive with positive orthostatics.  He also had severe anion gap metabolic acidosis, elevated lactic acid, and elevated LFTs. Initial Tylenol level returned at 152.  He was started on N-acetylcysteine and fomepizole. Poison control was also informed.  He responded well to treatment.  Tylenol level returned to  undetectable and LFTs improved. He was recommended to continue holding on any Tylenol containing products at discharge for about 1 week.  Oxycodone was prescribed at discharge and patient was instructed to continue holding Percocet.  Assessment and Plan: * Syncope-resolved as of 09/07/2023 Positive orthostatic vital signs Initially hypotensive, responded well to IV fluid. Add midodrine 5 mg twice daily; will wean as able Fall precautions PT OT assessment  Tylenol overdose, accidental or unintentional, initial encounter - Unintentional but has been taking 5 tablets of Tylenol twice a day.  2 of them are arthritis strength and 3 of them are extra strength -Level 152 on admission -Poison control following - Received fomepizole - Continue N-acetylcysteine protocol; discontinued evening of 09/08/2023 - Tylenol level undetectable and LFTs improved.  INR also normalized  Acute renal failure superimposed on stage 3a chronic kidney disease (HCC) - patient has history of CKD3a. Baseline creat ~ 1.5, eGFR~ 50 - patient presents with increase in creat >0.3 mg/dL above baseline or creat increase >1.5x baseline presumed to have occurred within past 7 days PTA - creat 2.31 on admission - likely some volume depletion and contribution from tylenol OD -Creatinine improved and 1.15 at discharge   Orthostatic hypotension - Still some positive orthostatics but recovers quickly after standing -Overall blood pressure has improved; BP meds held for a few more days at discharge  Otitis media - left ear noted with bulging TM, decreased light reflex and decreased hearing  - start on Augmentin and monitor response -Outpatient follow-up  Transaminitis - In setting of Tylenol overdose  Chronic pain syndrome - Continue on plain oxycodone  BPH (benign prostatic hyperplasia) - No home meds noted  Hyperlipidemia - Hold Crestor for now; held at discharge for approximately 2 more weeks -Repeat LFTs at  follow-up  Essential hypertension, benign - Hypotensive on admission.  Meds held on admission  Type 2 diabetes mellitus (HCC) - Last A1c 5.8% on 07/15/2023 - Continue diet control for now    The patient's acute and chronic medical conditions were treated accordingly. On day of discharge, patient was felt deemed stable for discharge. Patient/family member advised to call PCP or come back to ER if needed.   Principal Diagnosis: Syncope  Discharge Diagnoses: Active Hospital Problems   Diagnosis Date Noted   Tylenol overdose, accidental or unintentional, initial encounter 09/07/2023    Priority: 1.   Acute renal failure superimposed on stage 3a chronic kidney disease (HCC) 05/08/2018    Priority: 2.   Orthostatic hypotension 09/08/2023    Priority: 3.   Otitis media 09/07/2023    Priority: 3.   Transaminitis 09/07/2023   Chronic pain syndrome 04/16/2015   BPH (benign prostatic hyperplasia) 11/16/2013   Type 2 diabetes mellitus (HCC) 03/21/2013   Essential hypertension, benign 03/21/2013   Hyperlipidemia 03/21/2013   Overweight (BMI 25.0-29.9) 03/21/2013    Resolved Hospital Problems   Diagnosis Date Noted Date Resolved   Syncope 06/03/2014 09/07/2023    Priority: 1.     Discharge Instructions     Diet - low sodium heart healthy   Complete by: As directed    Increase activity slowly   Complete by: As directed       Allergies as of 09/09/2023       Reactions   Altace [ramipril] Anaphylaxis   Ace Inhibitors Hives   Levaquin [levofloxacin In D5w] Hives   Lyrica [pregabalin] Other (See Comments)   Edema   Metformin Diarrhea   High doses cause diarrhea.   Trulicity [dulaglutide] Nausea And Vomiting        Medication List     STOP taking these medications    acetaminophen 500 MG tablet Commonly known as: TYLENOL       TAKE these medications    amLODipine 5 MG tablet Commonly known as: NORVASC Take 1 tablet (5 mg total) by mouth daily. Start taking  on: September 13, 2023 What changed: These instructions start on September 13, 2023. If you are unsure what to do until then, ask your doctor or other care provider.   amoxicillin-clavulanate 500-125 MG tablet Commonly known as: AUGMENTIN Take 1 tablet by mouth 2 (two) times daily for 4 days.   CALCIUM 600/VITAMIN D PO Take 1 tablet by mouth daily after supper.   Cholecalciferol 50 MCG (2000 UT) Tabs Take 2,000 Units by mouth daily after supper.   esomeprazole 40 MG capsule Commonly known as: NEXIUM TAKE 1 CAPSULE BY MOUTH 2 TIMES DAILY BEFORE MEALS.   ezetimibe 10 MG tablet Commonly known as: ZETIA Take 1 tablet (10 mg total) by mouth daily.   fluticasone 50 MCG/ACT nasal spray Commonly known as: FLONASE Place 2 sprays into both nostrils daily.   gabapentin 300 MG capsule Commonly known as: NEURONTIN Take 300-600 mg by mouth 3 (three) times daily. Take one capsule in the morning, Take two capsules in the afternoon, and one capsule at night   methocarbamol 750 MG tablet Commonly known as: ROBAXIN TAKE ONE TABLET BY MOUTH EVERY EIGHT HOURS AS NEEDED FOR SPASM  multivitamin capsule Take 1 capsule by mouth daily. With copper and zinc   ondansetron 4 MG disintegrating tablet Commonly known as: ZOFRAN-ODT Allow 1-2 tablets to dissolve in your mouth every 8 hours as needed for nausea/vomiting   Oxycodone HCl 10 MG Tabs Take 1 tablet (10 mg total) by mouth every 4 (four) hours as needed.   oxyCODONE-acetaminophen 7.5-325 MG tablet Commonly known as: PERCOCET Take 1 tablet by mouth 4 (four) times daily as needed. Start taking on: September 14, 2023 What changed: These instructions start on September 14, 2023. If you are unsure what to do until then, ask your doctor or other care provider.   primidone 50 MG tablet Commonly known as: MYSOLINE TAKE ONE TABLET BY MOUTH AT BEDTIME   rosuvastatin 40 MG tablet Commonly known as: CRESTOR Take 1 tablet (40 mg total) by mouth at  bedtime. Start taking on: September 23, 2023 What changed:  when to take this These instructions start on September 23, 2023. If you are unsure what to do until then, ask your doctor or other care provider.   sucralfate 1 g tablet Commonly known as: CARAFATE TAKE 1 TABLET BY MOUTH 4 TIMES DAILY   Tradjenta 5 MG Tabs tablet Generic drug: linagliptin TAKE 1 TABLET BY MOUTH DAILY   valsartan 80 MG tablet Commonly known as: DIOVAN Take 1 tablet (80 mg total) by mouth at bedtime. Start taking on: September 13, 2023 What changed: These instructions start on September 13, 2023. If you are unsure what to do until then, ask your doctor or other care provider.   venlafaxine XR 75 MG 24 hr capsule Commonly known as: EFFEXOR-XR TAKE ONE CAPSULE EVERY MORNING WITH BREAKFAST        Follow-up Information     [facility] Follow up.   Specialty: Home Health Services Why: HHPT/OT arranged- they will contact pt for scheduling Contact information: [address] [place] [address] [phone]                Allergies  Allergen Reactions   Altace [Ramipril] Anaphylaxis   Ace Inhibitors Hives   Levaquin [Levofloxacin In D5w] Hives   Lyrica [Pregabalin] Other (See Comments)    Edema   Metformin Diarrhea    High doses cause diarrhea.    Trulicity [Dulaglutide] Nausea And Vomiting    Consultations:   Procedures:   Discharge Exam: BP (!) 143/84 (BP Location: Left Arm)   Pulse 83   Temp 97.7 F (36.5 C) (Oral)   Resp 15   Ht 5\' 6"  (1.676 m)   Wt 70.3 kg   SpO2 100%   BMI 25.02 kg/m  Physical Exam Constitutional:      General: He is not in acute distress.    Appearance: Normal appearance.  HENT:     Head: Normocephalic and atraumatic.     Left Ear: Drainage and swelling present. A middle ear effusion is present. There is impacted cerumen. Tympanic membrane is bulging.     Mouth/Throat:     Mouth: Mucous membranes are moist.  Eyes:      Extraocular Movements: Extraocular movements intact.  Cardiovascular:     Rate and Rhythm: Normal rate and regular rhythm.  Pulmonary:     Effort: Pulmonary effort is normal. No respiratory distress.     Breath sounds: Normal breath sounds. No wheezing.  Abdominal:     General: Bowel sounds are normal. There is no distension.     Palpations: Abdomen is soft.  Tenderness: There is no abdominal tenderness.  Musculoskeletal:        General: Normal range of motion.     Cervical back: Normal range of motion and neck supple.  Skin:    General: Skin is warm and dry.  Neurological:     General: No focal deficit present.     Mental Status: He is alert.  Psychiatric:        Mood and Affect: Mood normal.        Behavior: Behavior normal.      The results of significant diagnostics from this hospitalization (including imaging, microbiology, ancillary and laboratory) are listed below for reference.   Microbiology: Recent Results (from the past 240 hours)  Resp panel by RT-PCR (RSV, Flu A&B, Covid) Anterior Nasal Swab     Status: None   Collection Time: 09/06/23 10:43 PM   Specimen: Anterior Nasal Swab  Result Value Ref Range Status   SARS Coronavirus 2 by RT PCR NEGATIVE NEGATIVE Final   Influenza A by PCR NEGATIVE NEGATIVE Final   Influenza B by PCR NEGATIVE NEGATIVE Final    Comment: (NOTE) The Xpert Xpress SARS-CoV-2/FLU/RSV plus assay is intended as an aid in the diagnosis of influenza from Nasopharyngeal swab specimens and should not be used as a sole basis for treatment. Nasal washings and aspirates are unacceptable for Xpert Xpress SARS-CoV-2/FLU/RSV testing.  Fact Sheet for Patients: BloggerCourse.com  Fact Sheet for Healthcare Providers: SeriousBroker.it  This test is not yet approved or cleared by the [place] FDA and has been authorized for detection and/or diagnosis of SARS-CoV-2 by FDA under an Emergency Use  Authorization (EUA). This EUA will remain in effect (meaning this test can be used) for the duration of the COVID-19 declaration under Section 564(b)(1) of the Act, 21 U.S.C. section 360bbb-3(b)(1), unless the authorization is terminated or revoked.     Resp Syncytial Virus by PCR NEGATIVE NEGATIVE Final    Comment: (NOTE) Fact Sheet for Patients: BloggerCourse.com  Fact Sheet for Healthcare Providers: SeriousBroker.it  This test is not yet approved or cleared by the [place] FDA and has been authorized for detection and/or diagnosis of SARS-CoV-2 by FDA under an Emergency Use Authorization (EUA). This EUA will remain in effect (meaning this test can be used) for the duration of the COVID-19 declaration under Section 564(b)(1) of the Act, 21 U.S.C. section 360bbb-3(b)(1), unless the authorization is terminated or revoked.  Performed at [facility], [address], [place], [place] [address]   Blood Culture (routine x 2)     Status: None (Preliminary result)   Collection Time: 09/06/23 10:44 PM   Specimen: BLOOD  Result Value Ref Range Status   Specimen Description BLOOD SITE NOT SPECIFIED  Final   Special Requests   Final    BOTTLES DRAWN AEROBIC AND ANAEROBIC Blood Culture results may not be optimal due to an inadequate volume of blood received in culture bottles   Culture   Final    NO GROWTH 3 DAYS Performed at [facility], [address], [place], [place] [address]    Report Status PENDING  Incomplete  Blood Culture (routine x 2)     Status: None (Preliminary result)   Collection Time: 09/06/23 11:22 PM   Specimen: BLOOD LEFT HAND  Result Value Ref Range Status   Specimen Description BLOOD LEFT HAND  Final   Special Requests   Final    BOTTLES DRAWN AEROBIC ONLY Blood Culture adequate volume   Culture  Final    NO GROWTH 3 DAYS Performed at [facility], [address], [place], [place]  [address]    Report Status PENDING  Incomplete     Labs: BNP (last 3 results) No results for input(s): "BNP" in the last 8760 hours. Basic Metabolic Panel: Recent Labs  Lab 09/06/23 2207 09/07/23 0228 09/07/23 0428 09/08/23 1017 09/08/23 2023 09/09/23 0504  NA 137 138 141 140 140 141  K 3.9 4.4 3.9 2.5* 3.1* 3.5  CL 110  --  111 110 115* 113*  CO2 <7*  --  9* 15* 16* 16*  GLUCOSE 127*  --  95 88 138* 93  BUN 20  --  22 12 13 12   CREATININE 2.31*  --  2.37* 1.51* 1.32* 1.15  CALCIUM 8.3*  --  7.9* 8.5* 8.7* 8.9  MG  --   --  2.1 1.7  --  1.9  PHOS  --   --  4.7*  --   --   --    Liver Function Tests: Recent Labs  Lab 09/06/23 2207 09/07/23 0428 09/08/23 1017 09/08/23 2023 09/09/23 0504  AST 700* 509* 121* 100* 79*  ALT 896* 769* 387* 309* 271*  ALKPHOS 69 61 54 62 57  BILITOT 0.8 1.2 1.0 0.7 1.0  PROT 5.2* 4.8* 4.8* 4.7* 4.8*  ALBUMIN 2.6* 2.4* 2.2* 2.1* 2.1*   No results for input(s): "LIPASE", "AMYLASE" in the last 168 hours. No results for input(s): "AMMONIA" in the last 168 hours. CBC: Recent Labs  Lab 09/06/23 2211 09/07/23 0228 09/07/23 0428 09/08/23 0428 09/09/23 0504  WBC 9.1  --  10.9* 8.6 4.8  NEUTROABS 8.0*  --   --  6.2 2.9  HGB 12.8* 11.9* 12.2* 12.4* 10.8*  HCT 40.0 35.0* 37.1* 36.6* 31.4*  MCV 112.0*  --  110.4* 108.0* 104.3*  PLT 178  --  171 137* 124*   Cardiac Enzymes: Recent Labs  Lab 09/06/23 2355  CKTOTAL 74   BNP: Invalid input(s): "POCBNP" CBG: Recent Labs  Lab 09/07/23 1620  GLUCAP 93   D-Dimer No results for input(s): "DDIMER" in the last 72 hours. Hgb A1c No results for input(s): "HGBA1C" in the last 72 hours. Lipid Profile No results for input(s): "CHOL", "HDL", "LDLCALC", "TRIG", "CHOLHDL", "LDLDIRECT" in the last 72 hours. Thyroid function studies No results for input(s): "TSH", "T4TOTAL", "T3FREE", "THYROIDAB" in the last 72 hours.  Invalid input(s): "FREET3" Anemia work up No results for input(s):  "VITAMINB12", "FOLATE", "FERRITIN", "TIBC", "IRON", "RETICCTPCT" in the last 72 hours. Urinalysis    Component Value Date/Time   COLORURINE YELLOW 09/07/2023 0039   APPEARANCEUR CLEAR 09/07/2023 0039   APPEARANCEUR Clear 05/01/2014 0928   LABSPEC 1.032 (H) 09/07/2023 0039   LABSPEC 1.031 05/01/2014 0928   PHURINE 5.0 09/07/2023 0039   GLUCOSEU NEGATIVE 09/07/2023 0039   GLUCOSEU Negative 05/01/2014 0928   HGBUR NEGATIVE 09/07/2023 0039   BILIRUBINUR NEGATIVE 09/07/2023 0039   BILIRUBINUR neg 01/11/2022 1147   BILIRUBINUR Negative 05/01/2014 0928   KETONESUR 5 (A) 09/07/2023 0039   PROTEINUR 30 (A) 09/07/2023 0039   UROBILINOGEN 0.2 01/11/2022 1147   NITRITE NEGATIVE 09/07/2023 0039   LEUKOCYTESUR NEGATIVE 09/07/2023 0039   LEUKOCYTESUR Negative 05/01/2014 0928   Sepsis Labs Recent Labs  Lab 09/06/23 2211 09/07/23 0428 09/08/23 0428 09/09/23 0504  WBC 9.1 10.9* 8.6 4.8   Microbiology Recent Results (from the past 240 hours)  Resp panel by RT-PCR (RSV, Flu A&B, Covid) Anterior Nasal Swab  Status: None   Collection Time: 09/06/23 10:43 PM   Specimen: Anterior Nasal Swab  Result Value Ref Range Status   SARS Coronavirus 2 by RT PCR NEGATIVE NEGATIVE Final   Influenza A by PCR NEGATIVE NEGATIVE Final   Influenza B by PCR NEGATIVE NEGATIVE Final    Comment: (NOTE) The Xpert Xpress SARS-CoV-2/FLU/RSV plus assay is intended as an aid in the diagnosis of influenza from Nasopharyngeal swab specimens and should not be used as a sole basis for treatment. Nasal washings and aspirates are unacceptable for Xpert Xpress SARS-CoV-2/FLU/RSV testing.  Fact Sheet for Patients: BloggerCourse.com  Fact Sheet for Healthcare Providers: SeriousBroker.it  This test is not yet approved or cleared by the [place] FDA and has been authorized for detection and/or diagnosis of SARS-CoV-2 by FDA under an Emergency Use Authorization  (EUA). This EUA will remain in effect (meaning this test can be used) for the duration of the COVID-19 declaration under Section 564(b)(1) of the Act, 21 U.S.C. section 360bbb-3(b)(1), unless the authorization is terminated or revoked.     Resp Syncytial Virus by PCR NEGATIVE NEGATIVE Final    Comment: (NOTE) Fact Sheet for Patients: BloggerCourse.com  Fact Sheet for Healthcare Providers: SeriousBroker.it  This test is not yet approved or cleared by the [place] FDA and has been authorized for detection and/or diagnosis of SARS-CoV-2 by FDA under an Emergency Use Authorization (EUA). This EUA will remain in effect (meaning this test can be used) for the duration of the COVID-19 declaration under Section 564(b)(1) of the Act, 21 U.S.C. section 360bbb-3(b)(1), unless the authorization is terminated or revoked.  Performed at [facility], [address], [place], [place] [address]   Blood Culture (routine x 2)     Status: None (Preliminary result)   Collection Time: 09/06/23 10:44 PM   Specimen: BLOOD  Result Value Ref Range Status   Specimen Description BLOOD SITE NOT SPECIFIED  Final   Special Requests   Final    BOTTLES DRAWN AEROBIC AND ANAEROBIC Blood Culture results may not be optimal due to an inadequate volume of blood received in culture bottles   Culture   Final    NO GROWTH 3 DAYS Performed at [facility], [address] [place], [place] [address]    Report Status PENDING  Incomplete  Blood Culture (routine x 2)     Status: None (Preliminary result)   Collection Time: 09/06/23 11:22 PM   Specimen: BLOOD LEFT HAND  Result Value Ref Range Status   Specimen Description BLOOD LEFT HAND  Final   Special Requests   Final    BOTTLES DRAWN AEROBIC ONLY Blood Culture adequate volume   Culture   Final    NO GROWTH 3 DAYS Performed at [facility], [address] [place], [place] [address]     Report Status PENDING  Incomplete    Procedures/Studies: US Abdomen Limited RUQ (LIVER/GB) Result Date: 09/07/2023 CLINICAL DATA:  151471 RUQ pain 151471 EXAM: ULTRASOUND ABDOMEN LIMITED RIGHT UPPER QUADRANT COMPARISON:  CT abdomen pelvis 07/02/2023, CT abdomen pelvis 11/22/2022 FINDINGS: Gallbladder: Cholecystectomy. Common bile duct: Diameter: 12 mm-chronic dilated which can be seen in the post cholecystectomy setting. Liver: No focal lesion identified. Within normal limits in parenchymal echogenicity. Portal vein is patent on color Doppler imaging with normal direction of blood flow towards the liver. Other: Simple right renal cyst-no further follow-up indicated. IMPRESSION: Status post cholecystectomy with persistent common bile duct dilatation which can be seen in  this setting. Electronically Signed   By: Tish Frederickson M.D.   On: 09/07/2023 00:29   CT HEAD WO CONTRAST ( ) Result Date: 09/07/2023 CLINICAL DATA:  Head trauma, minor (Age >= 65y) Past few days feeling weak and having poor oral intake. No fever, no n/v/d. Pt notes drainage to left ear. EMS reports negative halo sign.In the past 24 hours pt has been dizzy EXAM: CT HEAD WITHOUT CONTRAST TECHNIQUE: Contiguous axial images were obtained from the base of the skull through the vertex without intravenous contrast. RADIATION DOSE REDUCTION: This exam was performed according to the departmental dose-optimization program which includes automated exposure control, adjustment of the mA and/or kV according to patient size and/or use of iterative reconstruction technique. COMPARISON:  MRI head 07/09/2023, CT head 07/02/2023, CT head 12/17/2021 FINDINGS: Brain: Patchy and confluent areas of decreased attenuation are noted throughout the deep and periventricular white matter of the cerebral hemispheres bilaterally, compatible with chronic microvascular ischemic disease. no evidence of large-territorial acute infarction. No parenchymal hemorrhage. No  mass lesion. No extra-axial collection. No mass effect or midline shift. No hydrocephalus. Basilar cisterns are patent. Vascular: No hyperdense vessel. Skull: No acute fracture or focal lesion. Sinuses/Orbits: Left maxillary sinus mucosal thickening with associated hypertrophy of the sinus walls. Complete left mastoid air cell effusion. Partial right mastoid air cell effusion. Fluid noted within the left middle ear. Otherwise paranasal sinuses are clear. The orbits are unremarkable. Other: None. IMPRESSION: 1. No acute intracranial abnormality. 2. Chronic left maxillary sinus disease. 3. Complete left mastoid air cell effusion with associated middle ear fluid. 4. Partial right mastoid air cell effusion. Electronically Signed   By: Tish Frederickson M.D.   On: 09/07/2023 00:26   DG Chest Port 1 View Result Date: 09/06/2023 CLINICAL DATA:  Questionable sepsis - evaluate for abnormality EXAM: PORTABLE CHEST 1 VIEW COMPARISON:  07/02/2023 FINDINGS: The cardiomediastinal contours are normal. The lungs are clear. Pulmonary vasculature is normal. No consolidation, pleural effusion, or pneumothorax. Multiple remote left rib fractures. Lumbar and cervical hardware is partially included. Chronic ossified intra-articular body left axillary recess. IMPRESSION: No active disease. Electronically Signed   By: Narda Rutherford M.D.   On: 09/06/2023 22:25     Time coordinating discharge: Over 30 minutes    Lewie Chamber, MD  [facility] 09/09/2023, 2:13 PM

## 2023-09-09 NOTE — Plan of Care (Signed)

## 2023-09-09 NOTE — TOC Transition Note (Signed)
Transition of Care (TOC) - Discharge Note Donn Pierini RN,BSN Transitions of Care Unit 4NP (Non Trauma)- RN Case Manager See Treatment Team for direct Phone #   Patient Details  Name: Justin Patel MRN: [id number] Date of Birth: Sep 08, 1950  Transition of Care [facility]) CM/SW Contact:  Darrold Span, RN Phone Number: 09/09/2023, 11:29 AM   Clinical Narrative:    Pt stable for transition home today, Orders placed for HHPT/OT as per therapy recommendations. No DME needs noted.   CM in to speak with pt for [facility] choice- list provided Per CMS guidelines from [email or web address] website with star ratings (copy placed in shadow chart) Pt voiced that he has had HH in past but does not remember name- request to call wife- TC made to wife to discuss [facility]- per wife she also does not remember name, per review in [employer] system no HH listed, wife voiced they do not have a preference  for provider.   Per wife she will come transport home- will be here around 1pm.   Call made to [facility] for [facility] referral- referral has been accepted for HHPT/OT needs.   No further TOC needs noted.   Final next level of care: Home w Home Health Services Barriers to Discharge: No Barriers Identified   Patient Goals and CMS Choice Patient states their goals for this hospitalization and ongoing recovery are:: return home CMS Medicare.gov Compare Post Acute Care list provided to:: Patient Represenative (must comment) Choice offered to / list presented to : Patient, Spouse      Discharge Placement               Home w/ [facility]        Discharge Plan and Services Additional resources added to the After Visit Summary for     Discharge Planning Services: CM Consult Post Acute Care Choice: Home Health          DME Arranged: N/A DME Agency: NA       HH Arranged: PT, OT HH Agency: [facility] Date [name] Agency Contacted: 09/09/23 Time HH Agency Contacted: 1129 Representative spoke with at [facility]  Agency: Kandee Keen  Social Drivers of Health (SDOH) Interventions SDOH Screenings   Food Insecurity: Patient Declined (09/07/2023)  Housing: Low Risk  (07/20/2023)  Transportation Needs: Patient Declined (09/07/2023)  Utilities: Not At Risk (09/07/2023)  Alcohol Screen: Low Risk  (07/13/2023)  Depression (PHQ2-9): Low Risk  (07/22/2023)  Financial Resource Strain: Low Risk  (07/13/2023)  Physical Activity: Inactive (07/13/2023)  Social Connections: Socially Integrated (07/13/2023)  Stress: No Stress Concern Present (07/13/2023)  Tobacco Use: Low Risk  (09/06/2023)  Health Literacy: Adequate Health Literacy (07/13/2023)     Readmission Risk Interventions    09/09/2023   11:29 AM  Readmission Risk Prevention Plan  Transportation Screening Complete  Home Care Screening Complete  Medication Review (RN CM) Complete

## 2023-09-09 NOTE — Progress Notes (Signed)
Pt to be discharged home and wife will come pick up pt. Pt to be discharged by discharge lounge nurse and transported off unit to the discharge lounge to wait on spouse to come pick him up. Pt IV and telemetry removed. Pt to pick up electronically sent prescriptions from preferred pharmacy on file. Pt wife Meriam Sprague notified of pt going down to the discharge lounge to wait on her. Dionne Bucy RN

## 2023-09-09 NOTE — Progress Notes (Signed)
Pt discharge education and instructions completed with pt and his wife Meriam Sprague to voices understanding, denies any questions. Pt discharged home with wife. Pt transported off unit via wheelchair with belongings to the side. Dionne Bucy RN

## 2023-09-11 LAB — CULTURE, BLOOD (ROUTINE X 2)
Culture: NO GROWTH
Culture: NO GROWTH
Special Requests: ADEQUATE

## 2023-09-12 ENCOUNTER — Telehealth: Payer: Self-pay | Admitting: *Deleted

## 2023-09-12 DIAGNOSIS — N1831 Chronic kidney disease, stage 3a: Secondary | ICD-10-CM | POA: Diagnosis not present

## 2023-09-12 DIAGNOSIS — H6592 Unspecified nonsuppurative otitis media, left ear: Secondary | ICD-10-CM | POA: Diagnosis not present

## 2023-09-12 DIAGNOSIS — I951 Orthostatic hypotension: Secondary | ICD-10-CM | POA: Diagnosis not present

## 2023-09-12 DIAGNOSIS — I130 Hypertensive heart and chronic kidney disease with heart failure and stage 1 through stage 4 chronic kidney disease, or unspecified chronic kidney disease: Secondary | ICD-10-CM | POA: Diagnosis not present

## 2023-09-12 DIAGNOSIS — I5032 Chronic diastolic (congestive) heart failure: Secondary | ICD-10-CM | POA: Diagnosis not present

## 2023-09-12 DIAGNOSIS — N179 Acute kidney failure, unspecified: Secondary | ICD-10-CM | POA: Diagnosis not present

## 2023-09-12 DIAGNOSIS — D684 Acquired coagulation factor deficiency: Secondary | ICD-10-CM | POA: Diagnosis not present

## 2023-09-12 DIAGNOSIS — E1122 Type 2 diabetes mellitus with diabetic chronic kidney disease: Secondary | ICD-10-CM | POA: Diagnosis not present

## 2023-09-12 DIAGNOSIS — D631 Anemia in chronic kidney disease: Secondary | ICD-10-CM | POA: Diagnosis not present

## 2023-09-12 NOTE — Transitions of Care (Post Inpatient/ED Visit) (Signed)
09/12/2023  Name: Justin Patel MRN: [id number] DOB: 07/10/1951  Today's TOC FU Call Status: Today's TOC FU Call Status:: Successful TOC FU Call Completed TOC FU Call Complete Date: 09/12/23 Patient's Name and Date of Birth confirmed.  Transition Care Management Follow-up Telephone Call Date of Discharge: 09/09/23 Discharge Facility: [facility]) Type of Discharge: Inpatient Admission Primary Inpatient Discharge Diagnosis:: Syncope/ orthostasis How have you been since you were released from the hospital?: Better ("So far so good, I have only been home a couple of days and have not yet started taking my blood pressure medication again yet.  I hope they don't make me pass out again.  I will try to start using my walker and cane more consistently") Any questions or concerns?: No  Items Reviewed: Did you receive and understand the discharge instructions provided?: Yes (thoroughly reviewed with patient and spouse who verbalizes good understanding of same) Medications obtained,verified, and reconciled?: Yes (Medications Reviewed) (Full medication reconciliation/ review completed; no concerns or discrepancies identified; confirmed patient obtained/ is taking all newly Rx'd medications as instructed; spouse-manages medications and denies questions/ concerns around medications today) Any new allergies since your discharge?: No Dietary orders reviewed?: Yes Type of Diet Ordered:: "Healthy as possible" Do you have support at home?: Yes People in Home: spouse Name of Support/Comfort Primary Source: Reports independent in self-care activities; supportive spouse assists as/ if needed/ indicated  Medications Reviewed Today: Medications Reviewed Today     Reviewed by Michaela Corner, RN (Registered Nurse) on 09/12/23 at 1409  Med List Status: <None>   Medication Order Taking? Sig Documenting Provider Last Dose Status Informant  amLODipine (NORVASC) 5 MG tablet [id number] Yes Take 1 tablet (5 mg  total) by mouth daily. Lewie Chamber, MD Taking Active            Med Note Marilu Favre Sep 12, 2023  1:53 PM) 09/12/23: Reports during [facility] call- per hospital discharge instructions they are to re-start on 09/13/23  amoxicillin-clavulanate (AUGMENTIN) 500-125 MG tablet [id number] Yes Take 1 tablet by mouth 2 (two) times daily for 4 days. Lewie Chamber, MD Taking Active   Calcium Carbonate-Vitamin D (CALCIUM 600/VITAMIN D PO) [id number] Yes Take 1 tablet by mouth daily after supper. [provider] Taking Active Self, Pharmacy Records  Cholecalciferol 50 MCG (2000 UT) TABS [id number] Yes Take 2,000 Units by mouth daily after supper. [provider] Taking Active Self, Pharmacy Records  esomeprazole (NEXIUM) 40 MG capsule [id number] Yes TAKE 1 CAPSULE BY MOUTH 2 TIMES DAILY BEFORE MEALS. [name], MD Taking Active Self, Pharmacy Records  ezetimibe (ZETIA) 10 MG tablet [id number] Yes Take 1 tablet (10 mg total) by mouth daily. [name], MD Taking Active Self, Pharmacy Records  fluticasone [facility]) 50 MCG/ACT nasal spray [id number] Yes Place 2 sprays into both nostrils daily. [provider] Taking Active Self, Pharmacy Records  gabapentin (NEURONTIN) 300 MG capsule [id number] Yes Take 300-600 mg by mouth 3 (three) times daily. Take one capsule in the morning, Take two capsules in the afternoon, and one capsule at night [provider] Taking Active Self, Pharmacy Records           Med Note Marilu Favre Sep 12, 2023  1:55 PM) 09/12/23: Reports during [facility] call-- they have ordered re-fill and are awaiting it to be available for pick up  methocarbamol (ROBAXIN) 750 MG tablet [id number] No TAKE ONE TABLET BY MOUTH EVERY EIGHT HOURS AS NEEDED  FOR SPASM  Patient not taking: Reported on 09/12/2023   [name], MD Not Taking Active Self, Pharmacy Records           Med Note Marilu Favre Sep 12, 2023  1:56 PM) 09/12/23: Reports  during [facility] call he has not been taking recently x 3 weeks  Multiple Vitamin (MULTIVITAMIN) capsule [id number] Yes Take 1 capsule by mouth daily. With copper and zinc [name], MD Taking Active Self, Pharmacy Records  ondansetron (ZOFRAN-ODT) 4 MG disintegrating tablet [id number] No Allow 1-2 tablets to dissolve in your mouth every 8 hours as needed for nausea/vomiting  Patient not taking: Reported on 09/12/2023   Loleta Rose, MD Not Taking Active Self, Pharmacy Records           Med Note Marilu Favre Sep 12, 2023  1:57 PM) 09/12/23: Reports during [facility] call he has not been taking recently- may need refill/ re-prescribing  oxyCODONE 10 MG TABS [id number] Yes Take 1 tablet (10 mg total) by mouth every 4 (four) hours as needed. Lewie Chamber, MD Taking Active   oxyCODONE-acetaminophen (PERCOCET) 7.5-325 MG tablet [id number] Yes Take 1 tablet by mouth 4 (four) times daily as needed. Lewie Chamber, MD Taking Active   primidone (MYSOLINE) 50 MG tablet [id number] Yes TAKE ONE TABLET BY MOUTH AT BEDTIME Drema Dallas, DO Taking Active   rosuvastatin (CRESTOR) 40 MG tablet [id number] Yes Take 1 tablet (40 mg total) by mouth at bedtime. Lewie Chamber, MD Taking Active            Med Note Marilu Favre Sep 12, 2023  1:59 PM) 09/12/23: Reports during [facility] call, to re-start this medication on 09/23/23:  as per hospital discharge instructions  sucralfate (CARAFATE) 1 g tablet [id number] Yes TAKE 1 TABLET BY MOUTH 4 TIMES DAILY [name], MD Taking Active Self, Pharmacy Records  TRADJENTA 5 MG TABS tablet [id number] Yes TAKE 1 TABLET BY MOUTH DAILY [name], MD Taking Active Self, Pharmacy Records  valsartan (DIOVAN) 80 MG tablet [id number] Yes Take 1 tablet (80 mg total) by mouth at bedtime. Lewie Chamber, MD Taking Active            Med Note Michaela Corner   Mon Sep 12, 2023  2:00 PM) 09/12/23: Reports during [facility] call, to re-start taking on 02/04.25 as per hospital  discharge instructions from 09/09/23  venlafaxine XR (EFFEXOR-XR) 75 MG 24 hr capsule [id number] Yes TAKE ONE CAPSULE EVERY MORNING WITH BREAKFAST [name], MD Taking Active Self, Pharmacy Records           Home Care and Equipment/Supplies: Were Home Health Services Ordered?: Yes Name of Home Health Agency:: [facility] Has Agency set up a time to come to your home?: Yes First Home Health Visit Date: 09/12/23 Any new equipment or medical supplies ordered?: No  Functional Questionnaire: Do you need assistance with bathing/showering or dressing?: Yes (spouse supervises/ assists as indicated) Do you need assistance with meal preparation?: Yes (wife assists with meal preparation) Do you need assistance with eating?: No Do you have difficulty maintaining continence: No Do you need assistance with getting out of bed/getting out of a chair/moving?: No Do you have difficulty managing or taking your medications?: Yes (spouse manages all aspects of medication administration)  Follow up appointments reviewed: PCP Follow-up appointment confirmed?: Yes (care coordination outreach in real-time with scheduling care guide to successfully schedule hospital follow  up PCP appointment 09/15/23) Date of PCP follow-up appointment?: 09/15/23 Follow-up Provider: PCP- covering provider Specialist Hospital Follow-up appointment confirmed?: Yes Date of Specialist follow-up appointment?: 10/03/23 Follow-Up Specialty Provider:: Cancer Center- hematology provider Do you need transportation to your follow-up appointment?: No Do you understand care options if your condition(s) worsen?: Yes-patient verbalized understanding  SDOH Interventions Today    Flowsheet Row Most Recent Value  SDOH Interventions   Food Insecurity Interventions Intervention Not Indicated  Housing Interventions Intervention Not Indicated  Transportation Interventions Intervention Not Indicated  [spouse provides transportation]   Utilities Interventions Intervention Not Indicated      Interventions Today    Flowsheet Row Most Recent Value  Chronic Disease   Chronic disease during today's visit Other  [Recurrent syncope]  General Interventions   General Interventions Discussed/Reviewed General Interventions Discussed, Durable Medical Equipment (DME), Doctor Visits  Doctor Visits Discussed/Reviewed Doctor Visits Discussed, PCP, Specialist  Durable Medical Equipment (DME) BP Cuff, Wheelchair, Environmental consultant, Other  [confirmed currently requiring/ using assistive devices for ambulation - walker/ cane/ wheelchair: uses prn per patient report,  encouraged to use frequently given recurrent syncope,  occasionally monitos BP at home]  Wheelchair Standard  PCP/Specialist Visits Compliance with follow-up visit  Exercise Interventions   Exercise Discussed/Reviewed Exercise Discussed  [role of home health services with importance of participation/ ongoing engagement]  Education Interventions   Education Provided Provided Education  Provided Verbal Education On Medication  [process to get medications refilled,  fall precautions/ safety- need to use assistive devices with ambulation]  Nutrition Interventions   Nutrition Discussed/Reviewed Nutrition Discussed  Pharmacy Interventions   Pharmacy Dicussed/Reviewed Pharmacy Topics Discussed  [Full medication review with updating medication list in EHR per patient report]  Safety Interventions   Safety Discussed/Reviewed Safety Discussed, Fall Risk       TOC Interventions Today    Flowsheet Row Most Recent Value  TOC Interventions   TOC Interventions Discussed/Reviewed Arranged PCP follow up within 7 days/Care Guide scheduled, TOC Interventions Discussed  [Patient declines need for ongoing/ further care management/ coordination outreach,  declines enrollment in 30-day TOC program- declines taking my direct phone number should needs/ concerns arise post-TOC call]      Caryl Pina, RN, BSN, CCRN Alumnus RN Care Manager  Transitions of Care  [facility] Population [facility]  [place] [phone]: direct office

## 2023-09-15 ENCOUNTER — Encounter: Payer: Self-pay | Admitting: Nurse Practitioner

## 2023-09-15 ENCOUNTER — Ambulatory Visit: Payer: Medicare PPO | Admitting: Nurse Practitioner

## 2023-09-15 VITALS — BP 124/82 | Temp 98.6°F | Ht 66.0 in | Wt 162.6 lb

## 2023-09-15 DIAGNOSIS — I951 Orthostatic hypotension: Secondary | ICD-10-CM

## 2023-09-15 DIAGNOSIS — R7401 Elevation of levels of liver transaminase levels: Secondary | ICD-10-CM | POA: Diagnosis not present

## 2023-09-15 LAB — COMPREHENSIVE METABOLIC PANEL
ALT: 63 U/L — ABNORMAL HIGH (ref 0–53)
AST: 32 U/L (ref 0–37)
Albumin: 3.4 g/dL — ABNORMAL LOW (ref 3.5–5.2)
Alkaline Phosphatase: 52 U/L (ref 39–117)
BUN: 14 mg/dL (ref 6–23)
CO2: 33 meq/L — ABNORMAL HIGH (ref 19–32)
Calcium: 9.4 mg/dL (ref 8.4–10.5)
Chloride: 101 meq/L (ref 96–112)
Creatinine, Ser: 1.02 mg/dL (ref 0.40–1.50)
GFR: 73.38 mL/min (ref >60.00)
Glucose, Bld: 114 mg/dL — ABNORMAL HIGH (ref 70–99)
Potassium: 3.4 meq/L — ABNORMAL LOW (ref 3.5–5.1)
Sodium: 140 meq/L (ref 135–145)
Total Bilirubin: 0.4 mg/dL (ref 0.2–1.2)
Total Protein: 6.2 g/dL (ref 6.0–8.3)

## 2023-09-15 MED ORDER — GABAPENTIN 300 MG PO CAPS: 300.0000 mg | ORAL_CAPSULE | Freq: Two times a day (BID) | ORAL | 0 refills | Status: DC

## 2023-09-15 MED ORDER — ONDANSETRON 4 MG PO TBDP: ORAL_TABLET | ORAL | 0 refills | Status: DC

## 2023-09-15 NOTE — Progress Notes (Signed)
 Established Patient Office Visit  Subjective:  Patient ID: Justin Patel, male    DOB: 02-Jun-1951  Age: 73 y.o. MRN: [id number]  CC:  Chief Complaint  Patient presents with   Hospitalization Follow-up   Discussed the use of a AI scribe software for clinical note transcription with the patient, who gave verbal consent to proceed.  HPI  Justin Patel is a 73 year old male who presents for follow-up after a recent hospital discharge accompanied by his spouse.  He was hospitalized at [facility] from 09/06/2023 to 09/09/2023.  He initially went to the ED via EMS due to syncopal episode at home. He was admitted with a diagnosis of transaminitis related to unintentional acetaminophen  overdose and orthostatic hypotension.   He had significantly elevated AST 700 and ALT 896 and serum acetaminophen  level 152, metabolic acidosis  at admission and was started on IV fluids and acetylcysteine  with guidance of poison control.   Since discharge patient states that he is doing better.  Did not have any syncopal episode since discharge. Have started back on his blood pressure medications and home blood pressure stable.  He experienced  ear infection and throat infection during his hospital stay, for which he was prescribed Augmentin . He has a PE tube bilateral  ear and reports some discharge, which has improved with treatment.  Patient does not have the medication or medication list with him at present.  Patient states that he is not taking primidone .  Patient requested refill of gabapentin .  Advise caution with gabapentin  use due to potential side effect of dizziness.  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  removal scheduled to clear margins.   CHF (congestive heart failure) (HCC)    no longer after weight loss   Chronic kidney disease     cysts   Degenerative disc disease    Depression    Diabetes mellitus    no longer diabetic-or on meds   DJD (degenerative joint disease)    Dysplastic nevus 02/04/2016   R upper back paraspinal - severe   Family history of adverse reaction to anesthesia    sons wake up combative   Foot fracture, left 01/2022   Gastric ulcer    GERD (gastroesophageal reflux disease)    H/O hiatal hernia    Headache(784.0)    sinus   Heart murmur    History of fusion of thoracic spine 12/28/2021   History of gastric bypass    Hx of congestive heart failure    Hyperlipidemia    Hypertension    Iron  deficiency anemia 02/28/2015   Lumbar scoliosis    Opiate abuse, continuous (HCC) 06/20/2015   S/P laparoscopic cholecystectomy 02/18/2020   Sacral fracture, closed (HCC)    Seizures (HCC)    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, after GI bleed   Sleep apnea    hx not now since wt loss   Stones in the urinary tract    Syncope and collapse     Past Surgical History:  Procedure Laterality Date   ANTERIOR CERVICAL DECOMP/DISCECTOMY FUSION  01/26/2012   Procedure: ANTERIOR CERVICAL DECOMPRESSION/DISCECTOMY FUSION 3 LEVELS;  Surgeon: Catalina CHRISTELLA Stains, MD;  Location: [facility];  Service: Neurosurgery;  Laterality: N/A;  Cervical three-four Cervical four-five Cervical five-six Cervical six-seven ,  Anterior cervical decompression/diskectomy, fusion, plate   ANTERIOR LAT LUMBAR FUSION Right 04/25/2018   Procedure: Right Lumbar two-three Lumbar three-four Lumbar four-five anterior  lateral interbody fusion  with posterior percutaneous pedicle screws;  Surgeon: Unice Pac, MD;  Location: [facility] OR;  Service: Neurosurgery;  Laterality: Right;   APPENDECTOMY     CARDIAC CATHETERIZATION     [facility], normal   CARDIAC CATHETERIZATION     [place]   CHOLECYSTECTOMY     COLONOSCOPY WITH PROPOFOL  N/A 12/27/2017   Procedure: COLONOSCOPY WITH PROPOFOL ;  Surgeon: Toledo, Ladell POUR, MD;  Location:  [facility] END[facility]COPY;  Service: Gastroenterology;  Laterality: N/A;   ESOPHAGOGASTRODUODENOSCOPY (EGD) WITH PROPOFOL  N/A 12/27/2017   Procedure: ESOPHAGOGASTRODUODENOSCOPY (EGD) WITH PROPOFOL ;  Surgeon: Toledo, Ladell POUR, MD;  Location: [facility] ENDOSCOPY;  Service: Gastroenterology;  Laterality: N/A;   ESOPHAGOGASTRODUODENOSCOPY (EGD) WITH PROPOFOL  N/A 06/19/2021   Procedure: ESOPHAGOGASTRODUODENOSCOPY (EGD) WITH PROPOFOL ;  Surgeon: Maryruth Ole DASEN, MD;  Location: [facility] ENDOSCOPY;  Service: Gastroenterology;  Laterality: N/A;   ESOPHAGOGASTRODUODENOSCOPY (EGD) WITH PROPOFOL  N/A 09/25/2021   Procedure: ESOPHAGOGASTRODUODENOSCOPY (EGD) WITH PROPOFOL ;  Surgeon: Maryruth Ole DASEN, MD;  Location: [facility] ENDOSCOPY;  Service: Endoscopy;  Laterality: N/A;   GASTRIC BYPASS  08/09/2008   [employer] [facility]   HARDWARE REMOVAL N/A 03/08/2022   Procedure: Removal of bilateral Thoracic ten pedicle screws;  Surgeon: Louis Shove, MD;  Location[facility];  Service: Neurosurgery;  Laterality: N/A;   HERNIA REPAIR  08/09/1998   hiatal   JOINT REPLACEMENT     KNEE ARTHROSCOPY     bilateral, left x 2   LAMINECTOMY WITH POSTERIOR LATERAL ARTHRODESIS LEVEL 4 N/A 01/14/2020   Procedure: Decompressive Laminectomy Lumbar One with pedicle screw fixation from Thoracic Ten to Lumbar Two;  Surgeon: Unice Pac, MD;  Location: [facility] OR;  Service: Neurosurgery;  Laterality: N/A;  Decompressive Laminectomy Lumbar One with pedicle screw fixation from Thoracic Ten to Lumbar Two   LUMBAR PERCUTANEOUS PEDICLE SCREW 3 LEVEL N/A 04/25/2018   Procedure: LUMBAR PERCUTANEOUS PEDICLE SCREW 3 LEVEL;  Surgeon: Unice Pac, MD;  Location: [facility] OR;  Service: Neurosurgery;  Laterality: N/A;   NASAL SINUS SURGERY     x5   OSTEOTOMY  08/10/1999   left   ROUX-EN-Y GASTRIC BYPASS     THORACIC LAMINECTOMY FOR EPIDURAL ABSCESS N/A 03/28/2022   Procedure: THORACIC WOUND WASHOUT;  Surgeon: Joshua Alm RAMAN, MD;  Location: [facility] OR;  Service: Neurosurgery;   Laterality: N/A;   TONSILLECTOMY     TOTAL KNEE ARTHROPLASTY Left 05/09/2014   Dr. Mardee   UVULOPALATOPLASTY  08/09/2009    Family History  Problem Relation Age of Onset   Dementia Mother 57   Osteoporosis Mother    Heart disease Father 24   Diabetes Father    Lymphoma Sister        lymphoma, stage 4   Ovarian cancer Sister 6       Ovarian   Lupus Sister    Prostate cancer Maternal Uncle    Skin cancer Maternal Uncle    Tongue cancer Maternal Uncle        uncle died of heart attack   Alcoholism Maternal Uncle    Lung cancer Paternal Aunt        pat aunts x 4 died of lung cancer   Lung cancer Paternal Aunt    Lung cancer Paternal Aunt    Lung cancer Paternal Aunt    Mesothelioma Cousin        paternal cousin  Social History   Socioeconomic History   Marital status: Married    Spouse name: Not on file   Number of children: 2   Years of education: Not on file   Highest education level: Not on file  Occupational History   Not on file  Tobacco Use   Smoking status: Never   Smokeless tobacco: Never  Vaping Use   Vaping status: Never Used  Substance and Sexual Activity   Alcohol use: Yes    Alcohol/week: 21.0 standard drinks of alcohol    Types: 21 Glasses of wine per week    Comment: occassionally   Drug use: No   Sexual activity: Yes    Partners: Female  Other Topics Concern   Not on file  Social History Narrative   Lives in [place] with wife, [place]. 2 sons, Beverley FOLKS, Franky 30.. 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      Diet - regular diet, limited quantities after gastric bypass      Exercise - no regular, limited by arthritis in knees, occasional water aerobics   Right handed   One story house   Social Drivers of Health   Financial Resource Strain: Low Risk  (07/13/2023)   Overall Financial Resource Strain (CARDIA)    Difficulty of Paying Living Expenses: Not hard at all  Food Insecurity:  No Food Insecurity (09/12/2023)   Hunger Vital Sign    Worried About Running Out of Food in the Last Year: Never true    Ran Out of Food in the Last Year: Never true  Transportation Needs: No Transportation Needs (09/12/2023)   PRAPARE - Administrator, Civil Service (Medical): No    Lack of Transportation (Non-Medical): No  Physical Activity: Inactive (07/13/2023)   Exercise Vital Sign    Days of Exercise per Week: 0 days    Minutes of Exercise per Session: 0 min  Stress: No Stress Concern Present (07/13/2023)   [employer] of Occupational Health - Occupational Stress Questionnaire    Feeling of Stress : Only a little  Social Connections: Socially Integrated (07/13/2023)   Social Connection and Isolation Panel [NHANES]    Frequency of Communication with Friends and Family: Twice a week    Frequency of Social Gatherings with Friends and Family: More than three times a week    Attends Religious Services: More than 4 times per year    Active Member of [employer] or Organizations: Yes    Attends Engineer, Structural: More than 4 times per year    Marital Status: Married  Catering Manager Violence: Not At Risk (09/12/2023)   Humiliation, Afraid, Rape, and Kick questionnaire    Fear of Current or Ex-Partner: No    Emotionally Abused: No    Physically Abused: No    Sexually Abused: No     Outpatient Medications Prior to Visit  Medication Sig Dispense Refill   amLODipine  (NORVASC ) 5 MG tablet Take 1 tablet (5 mg total) by mouth daily.     Calcium  Carbonate-Vitamin D  (CALCIUM  600/VITAMIN D  PO) Take 1 tablet by mouth daily after supper.     Cholecalciferol  50 MCG (2000 UT) TABS Take 2,000 Units by mouth daily after supper.     esomeprazole  (NEXIUM ) 40 MG capsule TAKE 1 CAPSULE BY MOUTH 2 TIMES DAILY BEFORE MEALS. 180 capsule 1   ezetimibe  (ZETIA ) 10 MG tablet Take 1 tablet (10 mg total) by mouth daily.  90 tablet 3   fluticasone  (FLONASE ) 50 MCG/ACT nasal spray Place 2 sprays  into both nostrils daily.     Multiple Vitamin (MULTIVITAMIN) capsule Take 1 capsule by mouth daily. With copper  and zinc      oxyCODONE -acetaminophen  (PERCOCET) 7.5-325 MG tablet Take 1 tablet by mouth 4 (four) times daily as needed.     primidone  (MYSOLINE ) 50 MG tablet TAKE ONE TABLET BY MOUTH AT BEDTIME 30 tablet 2   [START ON 09/23/2023] rosuvastatin  (CRESTOR ) 40 MG tablet Take 1 tablet (40 mg total) by mouth at bedtime.     sucralfate  (CARAFATE ) 1 g tablet TAKE 1 TABLET BY MOUTH 4 TIMES DAILY 360 tablet 1   TRADJENTA  5 MG TABS tablet TAKE 1 TABLET BY MOUTH DAILY 90 tablet 1   valsartan  (DIOVAN ) 80 MG tablet Take 1 tablet (80 mg total) by mouth at bedtime.     venlafaxine  XR (EFFEXOR -XR) 75 MG 24 hr capsule TAKE ONE CAPSULE EVERY MORNING WITH BREAKFAST 90 capsule 1   gabapentin  (NEURONTIN ) 300 MG capsule Take 300-600 mg by mouth 3 (three) times daily. Take one capsule in the morning, Take two capsules in the afternoon, and one capsule at night     ondansetron  (ZOFRAN -ODT) 4 MG disintegrating tablet Allow 1-2 tablets to dissolve in your mouth every 8 hours as needed for nausea/vomiting 30 tablet 0   methocarbamol  (ROBAXIN ) 750 MG tablet TAKE ONE TABLET BY MOUTH EVERY EIGHT HOURS AS NEEDED FOR SPASM (Patient not taking: Reported on 09/15/2023) 30 tablet 0   oxyCODONE  10 MG TABS Take 1 tablet (10 mg total) by mouth every 4 (four) hours as needed. (Patient not taking: Reported on 09/15/2023) 30 tablet 0   No facility-administered medications prior to visit.    Allergies  Allergen Reactions   Altace [Ramipril] Anaphylaxis   Ace Inhibitors Hives   Levaquin [Levofloxacin In D5w] Hives   Lyrica [Pregabalin] Other (See Comments)    Edema   Metformin  Diarrhea    High doses cause diarrhea.    Trulicity  [Dulaglutide ] Nausea And Vomiting    ROS Review of Systems Negative unless indicated in HPI.    Objective:    Physical Exam Constitutional:      Appearance: Normal appearance.  HENT:      Right Ear: A PE tube is present.     Left Ear: A PE tube is present.     Mouth/Throat:     Mouth: Mucous membranes are moist.  Eyes:     Conjunctiva/sclera: Conjunctivae normal.     Pupils: Pupils are equal, round, and reactive to light.  Cardiovascular:     Rate and Rhythm: Normal rate and regular rhythm.     Pulses: Normal pulses.     Heart sounds: Normal heart sounds.  Pulmonary:     Effort: Pulmonary effort is normal.     Breath sounds: Normal breath sounds.  Skin:    General: Skin is warm.     Findings: Bruising present.  Neurological:     General: No focal deficit present.     Mental Status: He is alert and oriented to person, place, and time. Mental status is at baseline.  Psychiatric:        Mood and Affect: Mood normal.        Behavior: Behavior normal.        Thought Content: Thought content normal.        Judgment: Judgment normal.     Temp 98.6 F (37 C)   Ht 5' 6 (  1.676 m)   Wt 162 lb 9.6 oz (73.8 kg)   SpO2 96%   BMI 26.24 kg/m  Wt Readings from Last 3 Encounters:  09/15/23 162 lb 9.6 oz (73.8 kg)  09/06/23 155 lb (70.3 kg)  08/18/23 166 lb 6.4 oz (75.5 kg)     Health Maintenance  Topic Date Due   Diabetic kidney evaluation - Urine ACR  01/12/2023   FOOT EXAM  04/29/2023   OPHTHALMOLOGY EXAM  06/22/2023   COVID-19 Vaccine (3 - Pfizer risk series) 10/01/2023 (Originally 11/27/2019)   HEMOGLOBIN A1C  01/13/2024   DTaP/Tdap/Td (2 - Td or Tdap) 06/03/2024   Medicare Annual Wellness (AWV)  07/12/2024   Diabetic kidney evaluation - eGFR measurement  09/14/2024   Colonoscopy  12/28/2027   Pneumonia Vaccine 36+ Years old  Completed   INFLUENZA VACCINE  Completed   Hepatitis C Screening  Completed   Zoster Vaccines- Shingrix  Completed   HPV VACCINES  Aged Out    There are no preventive care reminders to display for this patient.  Lab Results  Component Value Date   TSH 2.040 05/11/2023   Lab Results  Component Value Date   WBC 4.8 09/09/2023    HGB 10.8 (L) 09/09/2023   HCT 31.4 (L) 09/09/2023   MCV 104.3 (H) 09/09/2023   PLT 124 (L) 09/09/2023   Lab Results  Component Value Date   NA 140 09/15/2023   K 4.0 09/19/2023   CO2 33 (H) 09/15/2023   GLUCOSE 114 (H) 09/15/2023   BUN 14 09/15/2023   CREATININE 1.02 09/15/2023   BILITOT 0.4 09/15/2023   ALKPHOS 52 09/15/2023   AST 32 09/15/2023   ALT 63 (H) 09/15/2023   PROT 6.2 09/15/2023   ALBUMIN  3.4 (L) 09/15/2023   CALCIUM  9.4 09/15/2023   ANIONGAP 12 09/09/2023   GFR 73.38 09/15/2023   Lab Results  Component Value Date   CHOL 144 08/11/2022   Lab Results  Component Value Date   HDL 64.20 08/11/2022   Lab Results  Component Value Date   LDLCALC 64 08/11/2022   Lab Results  Component Value Date   TRIG 81.0 08/11/2022   Lab Results  Component Value Date   CHOLHDL 2 08/11/2022   Lab Results  Component Value Date   HGBA1C 5.8 (H) 07/15/2023      Assessment & Plan:  Transaminitis Assessment & Plan: Last AST 79 and ALT 271 at discharge on 09/09/2023. Patient has stopped taking Tylenol . -Will check CMP   Orders: -     Comprehensive metabolic panel  Orthostatic hypotension Assessment & Plan: Blood pressure readings at home have been normal since restarting antihypertensive medications. Orthostatic blood pressure readings were stable during the visit. -Continue amlodipine  and valsartan  as currently prescribed. -Advise patient to change positions slowly to prevent dizziness and potential falls. -Patient stated that he is not taking primidone    Other orders -     Ondansetron ; Allow 1-2 tablets to dissolve in your mouth every 8 hours as needed for nausea/vomiting  Dispense: 30 tablet; Refill: 0 -     Gabapentin ; Take 1 capsule (300 mg total) by mouth 2 (two) times daily. Take one capsule in the morning and one capsule at night  Dispense: 60 capsule; Refill: 0    Follow-up: Return in about 2 weeks (around 09/29/2023), or if symptoms worsen or fail to  improve, for PCP.   Braysen Cloward, NP

## 2023-09-15 NOTE — Patient Instructions (Addendum)
 Continue to take the BP medication Valsartan  and amlodipine .  Please monitor blood pressure daily and follow up in PCP in 2 weeks.

## 2023-09-16 ENCOUNTER — Encounter: Payer: Self-pay | Admitting: Nurse Practitioner

## 2023-09-16 ENCOUNTER — Telehealth: Payer: Self-pay

## 2023-09-16 DIAGNOSIS — N179 Acute kidney failure, unspecified: Secondary | ICD-10-CM | POA: Diagnosis not present

## 2023-09-16 DIAGNOSIS — E1122 Type 2 diabetes mellitus with diabetic chronic kidney disease: Secondary | ICD-10-CM | POA: Diagnosis not present

## 2023-09-16 DIAGNOSIS — D631 Anemia in chronic kidney disease: Secondary | ICD-10-CM | POA: Diagnosis not present

## 2023-09-16 DIAGNOSIS — I951 Orthostatic hypotension: Secondary | ICD-10-CM | POA: Diagnosis not present

## 2023-09-16 DIAGNOSIS — D684 Acquired coagulation factor deficiency: Secondary | ICD-10-CM | POA: Diagnosis not present

## 2023-09-16 DIAGNOSIS — I130 Hypertensive heart and chronic kidney disease with heart failure and stage 1 through stage 4 chronic kidney disease, or unspecified chronic kidney disease: Secondary | ICD-10-CM | POA: Diagnosis not present

## 2023-09-16 DIAGNOSIS — N1831 Chronic kidney disease, stage 3a: Secondary | ICD-10-CM | POA: Diagnosis not present

## 2023-09-16 DIAGNOSIS — I5032 Chronic diastolic (congestive) heart failure: Secondary | ICD-10-CM | POA: Diagnosis not present

## 2023-09-16 DIAGNOSIS — H6592 Unspecified nonsuppurative otitis media, left ear: Secondary | ICD-10-CM | POA: Diagnosis not present

## 2023-09-16 NOTE — Telephone Encounter (Signed)
 Copied from CRM [id number]. Topic: Clinical - Home Health Verbal Orders >> Sep 16, 2023  1:00 PM Evie B wrote: Caller/Agency: lemul from [facility] Callback Number: [phone] Service Requested: Physical Therapy Frequency: 2 week 4 , 1 week 3  Any new concerns about the patient? Yes level 2 medication interaction for TRADJENTA  5 MG TABS tablet, primidone  (MYSOLINE ) 50 MG tablet pt is not taking . methocarbamol  (ROBAXIN ) 750 MG tablet or multivitamin.

## 2023-09-16 NOTE — Telephone Encounter (Signed)
 Noted.  You can give verbal orders for PT.

## 2023-09-16 NOTE — Progress Notes (Signed)
 Please schedule lab for potassium recheck on  2/10

## 2023-09-16 NOTE — Telephone Encounter (Signed)
 Called and spoke with Justin Patel, verbal orders given.

## 2023-09-17 DIAGNOSIS — N1831 Chronic kidney disease, stage 3a: Secondary | ICD-10-CM | POA: Diagnosis not present

## 2023-09-17 DIAGNOSIS — I951 Orthostatic hypotension: Secondary | ICD-10-CM | POA: Diagnosis not present

## 2023-09-17 DIAGNOSIS — E1122 Type 2 diabetes mellitus with diabetic chronic kidney disease: Secondary | ICD-10-CM | POA: Diagnosis not present

## 2023-09-17 DIAGNOSIS — I5032 Chronic diastolic (congestive) heart failure: Secondary | ICD-10-CM | POA: Diagnosis not present

## 2023-09-17 DIAGNOSIS — D684 Acquired coagulation factor deficiency: Secondary | ICD-10-CM | POA: Diagnosis not present

## 2023-09-17 DIAGNOSIS — I130 Hypertensive heart and chronic kidney disease with heart failure and stage 1 through stage 4 chronic kidney disease, or unspecified chronic kidney disease: Secondary | ICD-10-CM | POA: Diagnosis not present

## 2023-09-17 DIAGNOSIS — H6592 Unspecified nonsuppurative otitis media, left ear: Secondary | ICD-10-CM | POA: Diagnosis not present

## 2023-09-17 DIAGNOSIS — N179 Acute kidney failure, unspecified: Secondary | ICD-10-CM | POA: Diagnosis not present

## 2023-09-17 DIAGNOSIS — D631 Anemia in chronic kidney disease: Secondary | ICD-10-CM | POA: Diagnosis not present

## 2023-09-19 ENCOUNTER — Other Ambulatory Visit: Payer: Self-pay | Admitting: Family Medicine

## 2023-09-19 ENCOUNTER — Other Ambulatory Visit: Payer: Medicare PPO

## 2023-09-19 ENCOUNTER — Encounter: Payer: Self-pay | Admitting: Family Medicine

## 2023-09-19 ENCOUNTER — Other Ambulatory Visit (INDEPENDENT_AMBULATORY_CARE_PROVIDER_SITE_OTHER): Payer: Medicare PPO

## 2023-09-19 DIAGNOSIS — E876 Hypokalemia: Secondary | ICD-10-CM

## 2023-09-19 LAB — POTASSIUM: Potassium: 4 meq/L (ref 3.5–5.1)

## 2023-09-20 DIAGNOSIS — I951 Orthostatic hypotension: Secondary | ICD-10-CM | POA: Diagnosis not present

## 2023-09-20 DIAGNOSIS — D631 Anemia in chronic kidney disease: Secondary | ICD-10-CM | POA: Diagnosis not present

## 2023-09-20 DIAGNOSIS — D684 Acquired coagulation factor deficiency: Secondary | ICD-10-CM | POA: Diagnosis not present

## 2023-09-20 DIAGNOSIS — I130 Hypertensive heart and chronic kidney disease with heart failure and stage 1 through stage 4 chronic kidney disease, or unspecified chronic kidney disease: Secondary | ICD-10-CM | POA: Diagnosis not present

## 2023-09-20 DIAGNOSIS — H6592 Unspecified nonsuppurative otitis media, left ear: Secondary | ICD-10-CM | POA: Diagnosis not present

## 2023-09-20 DIAGNOSIS — E1122 Type 2 diabetes mellitus with diabetic chronic kidney disease: Secondary | ICD-10-CM | POA: Diagnosis not present

## 2023-09-20 DIAGNOSIS — N179 Acute kidney failure, unspecified: Secondary | ICD-10-CM | POA: Diagnosis not present

## 2023-09-20 DIAGNOSIS — I5032 Chronic diastolic (congestive) heart failure: Secondary | ICD-10-CM | POA: Diagnosis not present

## 2023-09-20 DIAGNOSIS — N1831 Chronic kidney disease, stage 3a: Secondary | ICD-10-CM | POA: Diagnosis not present

## 2023-09-21 NOTE — Assessment & Plan Note (Signed)
Last AST 79 and ALT 271 at discharge on 09/09/2023. Patient has stopped taking Tylenol. -Will check CMP

## 2023-09-21 NOTE — Assessment & Plan Note (Signed)
Blood pressure readings at home have been normal since restarting antihypertensive medications. Orthostatic blood pressure readings were stable during the visit. -Continue amlodipine and valsartan as currently prescribed. -Advise patient to change positions slowly to prevent dizziness and potential falls. -Patient stated that he is not taking primidone

## 2023-09-22 DIAGNOSIS — E1122 Type 2 diabetes mellitus with diabetic chronic kidney disease: Secondary | ICD-10-CM | POA: Diagnosis not present

## 2023-09-22 DIAGNOSIS — N1831 Chronic kidney disease, stage 3a: Secondary | ICD-10-CM | POA: Diagnosis not present

## 2023-09-22 DIAGNOSIS — D631 Anemia in chronic kidney disease: Secondary | ICD-10-CM | POA: Diagnosis not present

## 2023-09-22 DIAGNOSIS — I130 Hypertensive heart and chronic kidney disease with heart failure and stage 1 through stage 4 chronic kidney disease, or unspecified chronic kidney disease: Secondary | ICD-10-CM | POA: Diagnosis not present

## 2023-09-22 DIAGNOSIS — I5032 Chronic diastolic (congestive) heart failure: Secondary | ICD-10-CM | POA: Diagnosis not present

## 2023-09-22 DIAGNOSIS — H6592 Unspecified nonsuppurative otitis media, left ear: Secondary | ICD-10-CM | POA: Diagnosis not present

## 2023-09-22 DIAGNOSIS — I951 Orthostatic hypotension: Secondary | ICD-10-CM | POA: Diagnosis not present

## 2023-09-22 DIAGNOSIS — N179 Acute kidney failure, unspecified: Secondary | ICD-10-CM | POA: Diagnosis not present

## 2023-09-22 DIAGNOSIS — D684 Acquired coagulation factor deficiency: Secondary | ICD-10-CM | POA: Diagnosis not present

## 2023-09-23 ENCOUNTER — Telehealth: Payer: Self-pay

## 2023-09-23 NOTE — Telephone Encounter (Signed)
Signed. Placed in signed folder.

## 2023-09-23 NOTE — Telephone Encounter (Signed)
Received paperwork from [place]. Placed in folder for review and signature

## 2023-09-23 NOTE — Telephone Encounter (Signed)
Forms faxed received confirmation.  Marland Kitchen

## 2023-09-27 ENCOUNTER — Ambulatory Visit: Payer: Medicare PPO | Admitting: Family Medicine

## 2023-09-27 ENCOUNTER — Encounter: Payer: Self-pay | Admitting: Family Medicine

## 2023-09-27 VITALS — BP 130/88 | HR 76 | Temp 97.9°F | Ht 66.0 in | Wt 165.0 lb

## 2023-09-27 DIAGNOSIS — D684 Acquired coagulation factor deficiency: Secondary | ICD-10-CM | POA: Diagnosis not present

## 2023-09-27 DIAGNOSIS — E1122 Type 2 diabetes mellitus with diabetic chronic kidney disease: Secondary | ICD-10-CM | POA: Diagnosis not present

## 2023-09-27 DIAGNOSIS — N400 Enlarged prostate without lower urinary tract symptoms: Secondary | ICD-10-CM

## 2023-09-27 DIAGNOSIS — I130 Hypertensive heart and chronic kidney disease with heart failure and stage 1 through stage 4 chronic kidney disease, or unspecified chronic kidney disease: Secondary | ICD-10-CM | POA: Diagnosis not present

## 2023-09-27 DIAGNOSIS — H6592 Unspecified nonsuppurative otitis media, left ear: Secondary | ICD-10-CM | POA: Diagnosis not present

## 2023-09-27 DIAGNOSIS — D631 Anemia in chronic kidney disease: Secondary | ICD-10-CM | POA: Diagnosis not present

## 2023-09-27 DIAGNOSIS — N1831 Chronic kidney disease, stage 3a: Secondary | ICD-10-CM | POA: Diagnosis not present

## 2023-09-27 DIAGNOSIS — R42 Dizziness and giddiness: Secondary | ICD-10-CM

## 2023-09-27 DIAGNOSIS — N179 Acute kidney failure, unspecified: Secondary | ICD-10-CM | POA: Diagnosis not present

## 2023-09-27 DIAGNOSIS — R7401 Elevation of levels of liver transaminase levels: Secondary | ICD-10-CM | POA: Diagnosis not present

## 2023-09-27 DIAGNOSIS — D649 Anemia, unspecified: Secondary | ICD-10-CM

## 2023-09-27 DIAGNOSIS — H6690 Otitis media, unspecified, unspecified ear: Secondary | ICD-10-CM

## 2023-09-27 DIAGNOSIS — I5032 Chronic diastolic (congestive) heart failure: Secondary | ICD-10-CM | POA: Diagnosis not present

## 2023-09-27 DIAGNOSIS — I10 Essential (primary) hypertension: Secondary | ICD-10-CM

## 2023-09-27 DIAGNOSIS — E785 Hyperlipidemia, unspecified: Secondary | ICD-10-CM | POA: Diagnosis not present

## 2023-09-27 DIAGNOSIS — R55 Syncope and collapse: Secondary | ICD-10-CM

## 2023-09-27 DIAGNOSIS — I959 Hypotension, unspecified: Secondary | ICD-10-CM

## 2023-09-27 DIAGNOSIS — I951 Orthostatic hypotension: Secondary | ICD-10-CM | POA: Diagnosis not present

## 2023-09-27 HISTORY — DX: Dizziness and giddiness: R42

## 2023-09-27 LAB — COMPREHENSIVE METABOLIC PANEL
ALT: 20 U/L (ref 0–53)
AST: 23 U/L (ref 0–37)
Albumin: 3.5 g/dL (ref 3.5–5.2)
Alkaline Phosphatase: 64 U/L (ref 39–117)
BUN: 18 mg/dL (ref 6–23)
CO2: 31 meq/L (ref 19–32)
Calcium: 8.7 mg/dL (ref 8.4–10.5)
Chloride: 101 meq/L (ref 96–112)
Creatinine, Ser: 1.12 mg/dL (ref 0.40–1.50)
GFR: 65.57 mL/min (ref >60.00)
Glucose, Bld: 96 mg/dL (ref 70–99)
Potassium: 4.1 meq/L (ref 3.5–5.1)
Sodium: 138 meq/L (ref 135–145)
Total Bilirubin: 0.3 mg/dL (ref 0.2–1.2)
Total Protein: 6.5 g/dL (ref 6.0–8.3)

## 2023-09-27 LAB — CBC
HCT: 37.4 % — ABNORMAL LOW (ref 39.0–52.0)
Hemoglobin: 12.3 g/dL — ABNORMAL LOW (ref 13.0–17.0)
MCHC: 32.9 g/dL (ref 30.0–36.0)
MCV: 109.6 fL — ABNORMAL HIGH (ref 78.0–100.0)
Platelets: 234 10*3/uL (ref 150.0–400.0)
RBC: 3.41 Mil/uL — ABNORMAL LOW (ref 4.22–5.81)
RDW: 15.7 % — ABNORMAL HIGH (ref 11.5–15.5)
WBC: 6.5 10*3/uL (ref 4.0–10.5)

## 2023-09-27 MED ORDER — METHOCARBAMOL 750 MG PO TABS: ORAL_TABLET | ORAL | 0 refills | Status: DC

## 2023-09-27 MED ORDER — MECLIZINE HCL 12.5 MG PO TABS: 12.5000 mg | ORAL_TABLET | Freq: Three times a day (TID) | ORAL | 0 refills | Status: DC | PRN

## 2023-09-27 MED ORDER — FLUTICASONE PROPIONATE 50 MCG/ACT NA SUSP: 2.0000 | Freq: Every day | NASAL | 6 refills | Status: AC

## 2023-09-27 MED ORDER — FINASTERIDE 5 MG PO TABS: 5.0000 mg | ORAL_TABLET | Freq: Every day | ORAL | 0 refills | Status: DC

## 2023-09-27 MED ORDER — EZETIMIBE 10 MG PO TABS: 10.0000 mg | ORAL_TABLET | Freq: Every day | ORAL | 3 refills | Status: DC

## 2023-09-27 NOTE — Assessment & Plan Note (Signed)
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  Meclizine refilled today.  He was given modified Epley maneuvers to do at home.

## 2023-09-27 NOTE — Assessment & Plan Note (Signed)
 Recheck today.

## 2023-09-27 NOTE — Patient Instructions (Signed)
 How to Perform the Epley Maneuver The Epley maneuver is an exercise that relieves symptoms of vertigo. Vertigo is the feeling that you or your surroundings are moving when they are not. When you feel vertigo, you may feel like the room is spinning and may have trouble walking. The Epley maneuver is used for a type of vertigo caused by a calcium deposit in a part of the inner ear. The maneuver involves changing head positions to help the deposit move out of the area. You can do this maneuver at home whenever you have symptoms of vertigo. You can repeat it in 24 hours if your vertigo has not gone away. Even though the Epley maneuver may relieve your vertigo for a few weeks, it is possible that your symptoms will return. This maneuver relieves vertigo, but it does not relieve dizziness. What are the risks? If it is done correctly, the Epley maneuver is considered safe. Sometimes it can lead to dizziness or nausea that goes away after a short time. If you develop other symptoms--such as changes in vision, weakness, or numbness--stop doing the maneuver and call your health care provider. Supplies needed: A bed or table. A pillow. How to do the Epley maneuver     Sit on the edge of a bed or table with your back straight and your legs extended or hanging over the edge of the bed or table. Turn your head halfway toward the affected ear or side as told by your health care provider. Lie backward quickly with your head turned until you are lying flat on your back. Your head should dangle (head-hanging position). You may want to position a pillow under your shoulders. Hold this position for at least 30 seconds. If you feel dizzy or have symptoms of vertigo, continue to hold the position until the symptoms stop. Turn your head to the opposite direction until your unaffected ear is facing down. Your head should continue to dangle. Hold this position for at least 30 seconds. If you feel dizzy or have symptoms of  vertigo, continue to hold the position until the symptoms stop. Turn your whole body to the same side as your head so that you are positioned on your side. Your head will now be nearly facedown and no longer needs to dangle. Hold for at least 30 seconds. If you feel dizzy or have symptoms of vertigo, continue to hold the position until the symptoms stop. Sit back up. You can repeat the maneuver in 24 hours if your vertigo does not go away. Follow these instructions at home: For 24 hours after doing the Epley maneuver: Keep your head in an upright position. When lying down to sleep or rest, keep your head raised (elevated) with two or more pillows. Avoid excessive neck movements. Activity Do not drive or use machinery if you feel dizzy. After doing the Epley maneuver, return to your normal activities as told by your health care provider. Ask your health care provider what activities are safe for you. General instructions Drink enough fluid to keep your urine pale yellow. Do not drink alcohol. Take over-the-counter and prescription medicines only as told by your health care provider. Keep all follow-up visits. This is important. Preventing vertigo symptoms Ask your health care provider if there is anything you should do at home to prevent vertigo. He or she may recommend that you: Keep your head elevated with two or more pillows while you sleep. Do not sleep on the side of your affected ear. Get  up slowly from bed. Avoid sudden movements during the day. Avoid extreme head positions or movement, such as looking up or bending over. Contact a health care provider if: Your vertigo gets worse. You have other symptoms, including: Nausea. Vomiting. Headache. Get help right away if you: Have vision changes. Have a headache or neck pain that is severe or getting worse. Cannot stop vomiting. Have new numbness or weakness in any part of your body. These symptoms may represent a serious problem  that is an emergency. Do not wait to see if the symptoms will go away. Get medical help right away. Call your local emergency services (911 in the [place] Do not drive yourself to the hospital. Summary Vertigo is the feeling that you or your surroundings are moving when they are not. The Epley maneuver is an exercise that relieves symptoms of vertigo. If the Epley maneuver is done correctly, it is considered safe. This information is not intended to replace advice given to you by your health care provider. Make sure you discuss any questions you have with your health care provider. Document Revised: 04/22/2023 Document Reviewed: 04/22/2023 Elsevier Patient Education  2024 [employer].

## 2023-09-27 NOTE — Assessment & Plan Note (Signed)
Significant improvement.  Patient has a tympanic membrane tube in place that is draining clear fluid.  I do not feel as though he has a bacterial otitis media at this time.  We will have him start on nasal Flonase to see if this will help reduce inflammation in his sinuses and thus reduce the drainage from his ear.

## 2023-09-27 NOTE — Assessment & Plan Note (Signed)
Chronic issue.  Patient is no longer on Flomax given his orthostasis.  We will trial of Proscar 5 mg daily to see if this is beneficial.

## 2023-09-27 NOTE — Assessment & Plan Note (Signed)
Chronic issue.  Adequately controlled in the setting of his orthostasis.  He will continue amlodipine 5 mg daily and valsartan 80 mg daily.

## 2023-09-27 NOTE — Progress Notes (Signed)
Justin Alar, MD Phone: [phone]  WARD Justin Patel is a 73 y.o. male who presents today for follow-up.  Hypertension/orthostasis/syncope: Patient notes no recurrent syncope since his last hospital visit.  He is back on amlodipine and valsartan.  No chest pain.  Notes less lightheadedness than he had previously.  Has been off of Flomax.  Blood pressures have been 120s-130s/70s-80s.  Vertigo: Patient reports intermittently having issues with dizziness and room spinning sensation when he rolls over in bed.  Was previously taking meclizine for this.  Ear infection: Patient reports left-sided ear infection.  He is finished antibiotics.  Notes continued drainage from his left ear though it is better.  Notes improvement in sinus congestion as well.  Elevated LFTs: Related to excessive Tylenol use.  He is back on Crestor.  No right upper quadrant pain.  Social History   Tobacco Use  Smoking Status Never  Smokeless Tobacco Never    Current Outpatient Medications on File Prior to Visit  Medication Sig Dispense Refill   amLODipine (NORVASC) 5 MG tablet Take 1 tablet (5 mg total) by mouth daily.     Calcium Carbonate-Vitamin D (CALCIUM 600/VITAMIN D PO) Take 1 tablet by mouth daily after supper.     Cholecalciferol 50 MCG (2000 UT) TABS Take 2,000 Units by mouth daily after supper.     esomeprazole (NEXIUM) 40 MG capsule TAKE 1 CAPSULE BY MOUTH 2 TIMES DAILY BEFORE MEALS. 180 capsule 1   gabapentin (NEURONTIN) 300 MG capsule Take 1 capsule (300 mg total) by mouth 2 (two) times daily. Take one capsule in the morning and one capsule at night 60 capsule 0   Multiple Vitamin (MULTIVITAMIN) capsule Take 1 capsule by mouth daily. With copper and zinc     ondansetron (ZOFRAN-ODT) 4 MG disintegrating tablet Allow 1-2 tablets to dissolve in your mouth every 8 hours as needed for nausea/vomiting 30 tablet 0   oxyCODONE-acetaminophen (PERCOCET) 7.5-325 MG tablet Take 1 tablet by mouth 4 (four) times  daily as needed.     primidone (MYSOLINE) 50 MG tablet TAKE ONE TABLET BY MOUTH AT BEDTIME 30 tablet 2   rosuvastatin (CRESTOR) 40 MG tablet Take 1 tablet (40 mg total) by mouth at bedtime.     sucralfate (CARAFATE) 1 g tablet TAKE 1 TABLET BY MOUTH 4 TIMES DAILY 360 tablet 1   TRADJENTA 5 MG TABS tablet TAKE 1 TABLET BY MOUTH DAILY 90 tablet 1   valsartan (DIOVAN) 80 MG tablet Take 1 tablet (80 mg total) by mouth at bedtime.     venlafaxine XR (EFFEXOR-XR) 75 MG 24 hr capsule TAKE ONE CAPSULE EVERY MORNING WITH BREAKFAST 90 capsule 1   oxyCODONE 10 MG TABS Take 1 tablet (10 mg total) by mouth every 4 (four) hours as needed. (Patient not taking: Reported on 09/15/2023) 30 tablet 0   No current facility-administered medications on file prior to visit.     ROS see history of present illness  Objective  Physical Exam Vitals:   09/27/23 1047  BP: 130/88  Pulse: 76  Temp: 97.9 F (36.6 C)  SpO2: 97%    BP Readings from Last 3 Encounters:  09/27/23 130/88  09/15/23 124/82  09/09/23 (!) 143/84   Wt Readings from Last 3 Encounters:  09/27/23 165 lb (74.8 kg)  09/15/23 162 lb 9.6 oz (73.8 kg)  09/06/23 155 lb (70.3 kg)    Physical Exam Constitutional:      General: He is not in acute distress.    Appearance: He  is not diaphoretic.  HENT:     Right Ear: Tympanic membrane normal.     Ears:     Comments: Left TM with tympanic membrane tube in place, clear drainage from this Cardiovascular:     Rate and Rhythm: Normal rate and regular rhythm.     Heart sounds: Normal heart sounds.  Pulmonary:     Effort: Pulmonary effort is normal.     Breath sounds: Normal breath sounds.  Skin:    General: Skin is warm and dry.  Neurological:     Mental Status: He is alert.      Assessment/Plan: Please see individual problem list.  Essential hypertension, benign Assessment & Plan: Chronic issue.  Adequately controlled in the setting of his orthostasis.  He will continue amlodipine 5  mg daily and valsartan 80 mg daily.   Hyperlipidemia, unspecified hyperlipidemia type -     Ezetimibe; Take 1 tablet (10 mg total) by mouth daily.  Dispense: 90 tablet; Refill: 3  Acute otitis media, unspecified otitis media type Assessment & Plan: Significant improvement.  Patient has a tympanic membrane tube in place that is draining clear fluid.  I do not feel as though he has a bacterial otitis media at this time.  We will have him start on nasal Flonase to see if this will help reduce inflammation in his sinuses and thus reduce the drainage from his ear.  Orders: -     Fluticasone Propionate; Place 2 sprays into both nostrils daily.  Dispense: 16 g; Refill: 6  Benign prostatic hyperplasia, unspecified whether lower urinary tract symptoms present Assessment & Plan: Chronic issue.  Patient is no longer on Flomax given his orthostasis.  We will trial of Proscar 5 mg daily to see if this is beneficial.  Orders: -     Finasteride; Take 1 tablet (5 mg total) by mouth daily.  Dispense: 30 tablet; Refill: 0  Transaminitis Assessment & Plan: Recheck today.  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  Meclizine refilled today.  He was given modified Epley maneuvers to do at home.  Orders: -     Meclizine HCl; Take 1 tablet (12.5 mg total) by mouth 3 (three) times daily as needed for dizziness.  Dispense: 30 tablet; Refill: 0  Anemia, unspecified type -     CBC  Other orders -     Methocarbamol; TAKE ONE TABLET BY MOUTH EVERY EIGHT HOURS AS NEEDED FOR SPASM  Dispense: 30 tablet; Refill: 0     Return in about 3 months (around 12/25/2023) for transfer to [facility].   Justin Alar, MD [facility]

## 2023-09-28 ENCOUNTER — Ambulatory Visit: Payer: Medicare PPO | Admitting: Family Medicine

## 2023-09-30 ENCOUNTER — Telehealth: Payer: Self-pay

## 2023-09-30 ENCOUNTER — Ambulatory Visit: Payer: Medicare PPO

## 2023-09-30 ENCOUNTER — Other Ambulatory Visit: Payer: Medicare PPO

## 2023-09-30 ENCOUNTER — Encounter: Payer: Self-pay | Admitting: Family Medicine

## 2023-09-30 ENCOUNTER — Ambulatory Visit: Payer: Medicare PPO | Admitting: Internal Medicine

## 2023-09-30 DIAGNOSIS — D631 Anemia in chronic kidney disease: Secondary | ICD-10-CM | POA: Diagnosis not present

## 2023-09-30 DIAGNOSIS — I130 Hypertensive heart and chronic kidney disease with heart failure and stage 1 through stage 4 chronic kidney disease, or unspecified chronic kidney disease: Secondary | ICD-10-CM | POA: Diagnosis not present

## 2023-09-30 DIAGNOSIS — N1831 Chronic kidney disease, stage 3a: Secondary | ICD-10-CM | POA: Diagnosis not present

## 2023-09-30 DIAGNOSIS — E1122 Type 2 diabetes mellitus with diabetic chronic kidney disease: Secondary | ICD-10-CM | POA: Diagnosis not present

## 2023-09-30 DIAGNOSIS — I951 Orthostatic hypotension: Secondary | ICD-10-CM | POA: Diagnosis not present

## 2023-09-30 DIAGNOSIS — N179 Acute kidney failure, unspecified: Secondary | ICD-10-CM | POA: Diagnosis not present

## 2023-09-30 DIAGNOSIS — I5032 Chronic diastolic (congestive) heart failure: Secondary | ICD-10-CM | POA: Diagnosis not present

## 2023-09-30 DIAGNOSIS — H6592 Unspecified nonsuppurative otitis media, left ear: Secondary | ICD-10-CM | POA: Diagnosis not present

## 2023-09-30 DIAGNOSIS — D684 Acquired coagulation factor deficiency: Secondary | ICD-10-CM | POA: Diagnosis not present

## 2023-09-30 NOTE — Telephone Encounter (Signed)
 Signed. Placed in signed folder.

## 2023-09-30 NOTE — Telephone Encounter (Signed)
Received home health orders from [place]. Place in folder for review and signature

## 2023-09-30 NOTE — Telephone Encounter (Signed)
 Forms faxed

## 2023-10-03 ENCOUNTER — Inpatient Hospital Stay: Payer: Medicare PPO | Admitting: Internal Medicine

## 2023-10-03 ENCOUNTER — Encounter: Payer: Self-pay | Admitting: Internal Medicine

## 2023-10-03 ENCOUNTER — Inpatient Hospital Stay: Payer: Medicare PPO

## 2023-10-03 ENCOUNTER — Inpatient Hospital Stay: Payer: Medicare PPO | Attending: Internal Medicine

## 2023-10-03 VITALS — BP 131/75 | HR 80

## 2023-10-03 VITALS — BP 136/93 | HR 80 | Temp 98.2°F | Ht 66.0 in | Wt 169.0 lb

## 2023-10-03 DIAGNOSIS — Z9884 Bariatric surgery status: Secondary | ICD-10-CM | POA: Diagnosis not present

## 2023-10-03 DIAGNOSIS — M810 Age-related osteoporosis without current pathological fracture: Secondary | ICD-10-CM | POA: Diagnosis not present

## 2023-10-03 DIAGNOSIS — N183 Chronic kidney disease, stage 3 unspecified: Secondary | ICD-10-CM | POA: Insufficient documentation

## 2023-10-03 DIAGNOSIS — D509 Iron deficiency anemia, unspecified: Secondary | ICD-10-CM | POA: Insufficient documentation

## 2023-10-03 DIAGNOSIS — E538 Deficiency of other specified B group vitamins: Secondary | ICD-10-CM | POA: Insufficient documentation

## 2023-10-03 DIAGNOSIS — E611 Iron deficiency: Secondary | ICD-10-CM

## 2023-10-03 LAB — VITAMIN B12: Vitamin B-12: 428 pg/mL (ref 180–914)

## 2023-10-03 LAB — CBC WITH DIFFERENTIAL (CANCER CENTER ONLY)
Abs Immature Granulocytes: 0.03 10*3/uL (ref 0.00–0.07)
Basophils Absolute: 0.1 10*3/uL (ref 0.0–0.1)
Basophils Relative: 1 %
Eosinophils Absolute: 0.2 10*3/uL (ref 0.0–0.5)
Eosinophils Relative: 3 %
HCT: 38.4 % — ABNORMAL LOW (ref 39.0–52.0)
Hemoglobin: 12.4 g/dL — ABNORMAL LOW (ref 13.0–17.0)
Immature Granulocytes: 1 %
Lymphocytes Relative: 18 %
Lymphs Abs: 1.1 10*3/uL (ref 0.7–4.0)
MCH: 35.5 pg — ABNORMAL HIGH (ref 26.0–34.0)
MCHC: 32.3 g/dL (ref 30.0–36.0)
MCV: 110 fL — ABNORMAL HIGH (ref 80.0–100.0)
Monocytes Absolute: 0.3 10*3/uL (ref 0.1–1.0)
Monocytes Relative: 5 %
Neutro Abs: 4.3 10*3/uL (ref 1.7–7.7)
Neutrophils Relative %: 72 %
Platelet Count: 194 10*3/uL (ref 150–400)
RBC: 3.49 MIL/uL — ABNORMAL LOW (ref 4.22–5.81)
RDW: 13.6 % (ref 11.5–15.5)
WBC Count: 5.9 10*3/uL (ref 4.0–10.5)
nRBC: 0 % (ref 0.0–0.2)

## 2023-10-03 LAB — BASIC METABOLIC PANEL - CANCER CENTER ONLY
Anion gap: 8 (ref 5–15)
BUN: 20 mg/dL (ref 8–23)
CO2: 26 mmol/L (ref 22–32)
Calcium: 9.2 mg/dL (ref 8.9–10.3)
Chloride: 103 mmol/L (ref 98–111)
Creatinine: 1.07 mg/dL (ref 0.61–1.24)
GFR, Estimated: >60 mL/min (ref >60)
Glucose, Bld: 155 mg/dL — ABNORMAL HIGH (ref 70–99)
Potassium: 3.9 mmol/L (ref 3.5–5.1)
Sodium: 137 mmol/L (ref 135–145)

## 2023-10-03 LAB — VITAMIN D 25 HYDROXY (VIT D DEFICIENCY, FRACTURES): Vit D, 25-Hydroxy: 40.63 ng/mL (ref 30–100)

## 2023-10-03 MED ORDER — IRON SUCROSE 20 MG/ML IV SOLN
200.0000 mg | Freq: Once | INTRAVENOUS | Status: AC
  Administered 2023-10-03: 200 mg via INTRAVENOUS
  Filled 2023-10-03: qty 10

## 2023-10-03 MED ORDER — CYANOCOBALAMIN 1000 MCG/ML IJ SOLN
1000.0000 ug | Freq: Once | INTRAMUSCULAR | Status: AC
  Administered 2023-10-03: 1000 ug via INTRAMUSCULAR
  Filled 2023-10-03: qty 1

## 2023-10-03 MED ORDER — SODIUM CHLORIDE 0.9% FLUSH
10.0000 mL | Freq: Once | INTRAVENOUS | Status: DC | PRN
  Filled 2023-10-03: qty 10

## 2023-10-03 NOTE — Patient Instructions (Signed)

## 2023-10-03 NOTE — Progress Notes (Signed)
 [facility] CONSULT NOTE  Patient Care Team: [name], MD as PCP - General (Family Medicine) Iran Ouch, MD as PCP - Cardiology (Cardiology) Drema Dallas, DO as Consulting Physician (Neurology) Earna Coder, MD as Consulting Physician (Oncology) Homsher, Wynona Canes, RN as VBCI Care Management  CHIEF COMPLAINTS/PURPOSE OF CONSULTATION: ANEMIA   HEMATOLOGY HISTORY  # ANEMIA [Hb; MCV-platelets- WBC; Iron sat; ferritin;  GFR- CT/US; FEB 2023- EGD [[facility]]/- Evidence of a Roux-en-Y gastrojejunostomy was found. The gastrojejunal anastomosis was characterized by healthy appearing mucosa. The jejunojejunal anastomosis was characterized by healthy appearing mucosa. Previous gastric ulcer has healed. 2019-Colonoscopy   Latest Reference Range & Units 01/05/22 11:13  TIBC 250.0 - 450.0 mcg/dL 098.1 (H)  Saturation Ratios 20.0 - 50.0 % 10.3 (L)  Ferritin 22.0 - 322.0 ng/mL 12.5 (L)  Transferrin 212.0 - 360.0 mg/dL 191.4 (H)  (H): Data is abnormally high (L): Data is abnormally low  # Leucocytosis/weight loss/extreme fatigue-bone marrow biopsy-June 2019-unremarkable.   #Mild macrocytosis without anemia-question copper deficiency/gastric bypass; recommend p.o. copper.   #  SDH [[place]- 2015]; gastric Bypass Surgery    Latest Reference Range & Units 01/05/22 11:13  TIBC 250.0 - 450.0 mcg/dL 782.9 (H)  Saturation Ratios 20.0 - 50.0 % 10.3 (L)  Ferritin 22.0 - 322.0 ng/mL 12.5 (L)  Transferrin 212.0 - 360.0 mg/dL 562.1 (H)  (H): Data is abnormally high (L): Data is abnormally low   No history exists.    Latest Reference Range & Units 01/05/22 11:13  WBC 4.0 - 10.5 K/uL 8.4  RBC 4.22 - 5.81 Mil/uL 4.19 (L)  Hemoglobin 13.0 - 17.0 g/dL 30.8 (L)  HCT 65.7 - 84.6 % 38.3 (L)  MCV 78.0 - 100.0 fl 91.4  MCHC 30.0 - 36.0 g/dL 96.2  RDW 95.2 - 84.1 % 16.7 (H)  Platelets 150.0 - 400.0 K/uL 207.0  (L): Data is abnormally low (H): Data is abnormally  high  HISTORY OF PRESENTING ILLNESS: Patient ambulating independently.  Alone.   Justin Patel 73 y.o.  male pleasant patient with a history of gastric bypass -anemia; chronic back pain s/p multiple surgeries osteoporosis is here for follow-up.  Larey Seat about 3 weeks ago, no injuries. Was seen in ED for this , BP dropped. Fell in 11/24, resulted in a hematoma on the back.   Patient complains of on going fatigue.  Chronic back pain s/p surgery-  [place] neurosurgery, Takes hydrocodone for chronic back pain. Pt in a pain clinic.   Pt feels fatigued.  Denies any blood in stools or black or stools.   Review of Systems  Constitutional:  Positive for malaise/fatigue. Negative for chills, diaphoresis, fever and weight loss.  HENT:  Negative for nosebleeds and sore throat.   Eyes:  Negative for double vision.  Respiratory:  Negative for cough, hemoptysis, sputum production, shortness of breath and wheezing.   Cardiovascular:  Negative for chest pain, palpitations, orthopnea and leg swelling.  Gastrointestinal:  Negative for abdominal pain, blood in stool, constipation, diarrhea, heartburn, melena, nausea and vomiting.  Genitourinary:  Negative for dysuria, frequency and urgency.  Musculoskeletal:  Positive for back pain and joint pain.  Skin: Negative.  Negative for itching and rash.  Neurological:  Negative for dizziness, tingling, focal weakness, weakness and headaches.  Endo/Heme/Allergies:  Does not bruise/bleed easily.  Psychiatric/Behavioral:  Negative for depression. The patient is not nervous/anxious and does not have insomnia.      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  removal scheduled to clear margins.   CHF (congestive heart failure) (HCC)    no longer after weight loss   Chronic kidney disease     cysts   Degenerative disc disease    Depression    Diabetes mellitus    no longer diabetic-or on meds   DJD (degenerative joint disease)    Dysplastic nevus 02/04/2016   R upper back paraspinal - severe   Family history of adverse reaction to anesthesia    sons wake up combative   Foot fracture, left 01/2022   Gastric ulcer    GERD (gastroesophageal reflux disease)    H/O hiatal hernia    Headache(784.0)    sinus   Heart murmur    History of fusion of thoracic spine 12/28/2021   History of gastric bypass    Hx of congestive heart failure    Hyperlipidemia    Hypertension    Iron deficiency anemia 02/28/2015   Lumbar scoliosis    Opiate abuse, continuous (HCC) 06/20/2015   S/P laparoscopic cholecystectomy 02/18/2020   Sacral fracture, closed (HCC)    Seizures (HCC)    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, after GI bleed   Sleep apnea    hx not now since wt loss   Stones in the urinary tract    Syncope and collapse     SURGICAL HISTORY: Past Surgical History:  Procedure Laterality Date   ANTERIOR CERVICAL DECOMP/DISCECTOMY FUSION  01/26/2012   Procedure: ANTERIOR CERVICAL DECOMPRESSION/DISCECTOMY FUSION 3 LEVELS;  Surgeon: [name], MD;  Location: [facility];  Service: Neurosurgery;  Laterality: N/A;  Cervical three-four Cervical four-five Cervical five-six Cervical six-seven , Anterior cervical decompression/diskectomy, fusion, plate   ANTERIOR LAT LUMBAR FUSION Right 04/25/2018   Procedure: Right Lumbar two-three Lumbar three-four Lumbar four-five anterior  lateral interbody fusion  with posterior percutaneous pedicle screws;  Surgeon: [name], MD;  Location: [facility] OR;  Service: Neurosurgery;  Laterality: Right;   APPENDECTOMY     CARDIAC CATHETERIZATION     [facility], normal   CARDIAC CATHETERIZATION     [place]   CHOLECYSTECTOMY     COLONOSCOPY WITH PROPOFOL N/A 12/27/2017   Procedure: COLONOSCOPY WITH PROPOFOL;  Surgeon: Toledo, [name],  MD;  Location: [facility];  Service: Gastroenterology;  Laterality: N/A;   ESOPHAGOGASTRODUODENOSCOPY (EGD) WITH PROPOFOL N/A 12/27/2017   Procedure: ESOPHAGOGASTRODUODENOSCOPY (EGD) WITH PROPOFOL;  Surgeon: Toledo, [name], MD;  Location: [facility]COPY;  Service: Gastroenterology;  Laterality: N/A;   ESOPHAGOGASTRODUODENOSCOPY (EGD) WITH PROPOFOL N/A 06/19/2021   Procedure: ESOPHAGOGASTRODUODENOSCOPY (EGD) WITH PROPOFOL;  Surgeon: [name], MD;  Location: [facility] END[facility]COPY;  Service: Gastroenterology;  Laterality: N/A;   ESOPHAGOGASTRODUODENOSCOPY (EGD) WITH PROPOFOL N/A 09/25/2021   Procedure: ESOPHAGOGASTRODUODENOSCOPY (EGD) WITH PROPOFOL;  Surgeon: [name], MD;  Location: [facility] ENDOSCOPY;  Service: Endoscopy;  Laterality: N/A;   GASTRIC BYPASS  08/09/2008   [employer]   HARDWARE REMOVAL N/A 03/08/2022   Procedure: Removal of bilateral Thoracic ten pedicle screws;  Surgeon: [name], MD;  Location[facility] OR;  Service: Neurosurgery;  Laterality: N/A;   HERNIA REPAIR  08/09/1998   hiatal   JOINT REPLACEMENT     KNEE ARTHROSCOPY     bilateral, left x 2   LAMINECTOMY WITH POSTERIOR LATERAL ARTHRODESIS LEVEL 4 N/A  01/14/2020   Procedure: Decompressive Laminectomy Lumbar One with pedicle screw fixation from Thoracic Ten to Lumbar Two;  Surgeon: [name], MD;  Location: [facility] OR;  Service: Neurosurgery;  Laterality: N/A;  Decompressive Laminectomy Lumbar One with pedicle screw fixation from Thoracic Ten to Lumbar Two   LUMBAR PERCUTANEOUS PEDICLE SCREW 3 LEVEL N/A 04/25/2018   Procedure: LUMBAR PERCUTANEOUS PEDICLE SCREW 3 LEVEL;  Surgeon: [name], MD;  Location: [facility] OR;  Service: Neurosurgery;  Laterality: N/A;   NASAL SINUS SURGERY     x5   OSTEOTOMY  08/10/1999   left   ROUX-EN-Y GASTRIC BYPASS     THORACIC LAMINECTOMY FOR EPIDURAL ABSCESS N/A 03/28/2022   Procedure: THORACIC WOUND WASHOUT;  Surgeon: [name], MD;  Location: [facility] OR;  Service:  Neurosurgery;  Laterality: N/A;   TONSILLECTOMY     TOTAL KNEE ARTHROPLASTY Left 05/09/2014   Dr. [name]   UVULOPALATOPLASTY  08/09/2009    SOCIAL HISTORY: Social History   Socioeconomic History   Marital status: Married    Spouse name: Not on file   Number of children: 2   Years of education: Not on file   Highest education level: Not on file  Occupational History   Not on file  Tobacco Use   Smoking status: Never   Smokeless tobacco: Never  Vaping Use   Vaping status: Never Used  Substance and Sexual Activity   Alcohol use: Yes    Alcohol/week: 21.0 standard drinks of alcohol    Types: 21 Glasses of wine per week    Comment: occassionally   Drug use: No   Sexual activity: Yes    Partners: Female  Other Topics Concern   Not on file  Social History Narrative   Lives in [place] with wife, Iola. 2 sons, Roderic Ovens, Caryn Bee 30.. 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      Diet - regular diet, limited quantities after gastric bypass      Exercise - no regular, limited by arthritis in knees, occasional water aerobics   Right handed   One story house   Social Drivers of Health   Financial Resource Strain: Low Risk  (07/13/2023)   Overall Financial Resource Strain (CARDIA)    Difficulty of Paying Living Expenses: Not hard at all  Food Insecurity: No Food Insecurity (09/12/2023)   Hunger Vital Sign    Worried About Running Out of Food in the Last Year: Never true    Ran Out of Food in the Last Year: Never true  Transportation Needs: No Transportation Needs (09/12/2023)   PRAPARE - Administrator, Civil Service (Medical): No    Lack of Transportation (Non-Medical): No  Physical Activity: Inactive (07/13/2023)   Exercise Vital Sign    Days of Exercise per Week: 0 days    Minutes of Exercise per Session: 0 min  Stress: No Stress Concern Present (07/13/2023)   [employer] of Occupational Health -  Occupational Stress Questionnaire    Feeling of Stress : Only a little  Social Connections: Socially Integrated (07/13/2023)   Social Connection and Isolation Panel [NHANES]    Frequency of Communication with Friends and Family: Twice a week    Frequency of Social Gatherings with Friends and Family: More than three times a week    Attends Religious Services: More than 4 times per year    Active Member of [employer] or Organizations: Yes  Attends Banker Meetings: More than 4 times per year    Marital Status: Married  Catering manager Violence: Not At Risk (09/12/2023)   Humiliation, Afraid, Rape, and Kick questionnaire    Fear of Current or Ex-Partner: No    Emotionally Abused: No    Physically Abused: No    Sexually Abused: No    FAMILY HISTORY: Family History  Problem Relation Age of Onset   Dementia Mother 63   Osteoporosis Mother    Heart disease Father 26   Diabetes Father    Lymphoma Sister        lymphoma, stage 4   Ovarian cancer Sister 43       Ovarian   Lupus Sister    Prostate cancer Maternal Uncle    Skin cancer Maternal Uncle    Tongue cancer Maternal Uncle        uncle died of heart attack   Alcoholism Maternal Uncle    Lung cancer Paternal Aunt        pat aunts x 4 died of lung cancer   Lung cancer Paternal Aunt    Lung cancer Paternal Aunt    Lung cancer Paternal Aunt    Mesothelioma Cousin        paternal cousin    ALLERGIES:  is allergic to altace [ramipril], ace inhibitors, levaquin [levofloxacin in d5w], lyrica [pregabalin], metformin, and trulicity [dulaglutide].  MEDICATIONS:  Current Outpatient Medications  Medication Sig Dispense Refill   amLODipine (NORVASC) 5 MG tablet Take 1 tablet (5 mg total) by mouth daily.     Calcium Carbonate-Vitamin D (CALCIUM 600/VITAMIN D PO) Take 1 tablet by mouth daily after supper.     Cholecalciferol 50 MCG (2000 UT) TABS Take 2,000 Units by mouth daily after supper.     esomeprazole (NEXIUM) 40 MG  capsule TAKE 1 CAPSULE BY MOUTH 2 TIMES DAILY BEFORE MEALS. 180 capsule 1   ezetimibe (ZETIA) 10 MG tablet Take 1 tablet (10 mg total) by mouth daily. 90 tablet 3   finasteride (PROSCAR) 5 MG tablet Take 1 tablet (5 mg total) by mouth daily. 30 tablet 0   fluticasone (FLONASE) 50 MCG/ACT nasal spray Place 2 sprays into both nostrils daily. 16 g 6   gabapentin (NEURONTIN) 300 MG capsule Take 1 capsule (300 mg total) by mouth 2 (two) times daily. Take one capsule in the morning and one capsule at night 60 capsule 0   meclizine (ANTIVERT) 12.5 MG tablet Take 1 tablet (12.5 mg total) by mouth 3 (three) times daily as needed for dizziness. 30 tablet 0   methocarbamol (ROBAXIN) 750 MG tablet TAKE ONE TABLET BY MOUTH EVERY EIGHT HOURS AS NEEDED FOR SPASM 30 tablet 0   Multiple Vitamin (MULTIVITAMIN) capsule Take 1 capsule by mouth daily. With copper and zinc     ondansetron (ZOFRAN-ODT) 4 MG disintegrating tablet Allow 1-2 tablets to dissolve in your mouth every 8 hours as needed for nausea/vomiting 30 tablet 0   oxyCODONE-acetaminophen (PERCOCET) 7.5-325 MG tablet Take 1 tablet by mouth 4 (four) times daily as needed.     primidone (MYSOLINE) 50 MG tablet TAKE ONE TABLET BY MOUTH AT BEDTIME 30 tablet 2   rosuvastatin (CRESTOR) 40 MG tablet Take 1 tablet (40 mg total) by mouth at bedtime.     sucralfate (CARAFATE) 1 g tablet TAKE 1 TABLET BY MOUTH 4 TIMES DAILY 360 tablet 1   TRADJENTA 5 MG TABS tablet TAKE 1 TABLET BY MOUTH DAILY 90 tablet 1  valsartan (DIOVAN) 80 MG tablet Take 1 tablet (80 mg total) by mouth at bedtime.     venlafaxine XR (EFFEXOR-XR) 75 MG 24 hr capsule TAKE ONE CAPSULE EVERY MORNING WITH BREAKFAST 90 capsule 1   No current facility-administered medications for this visit.   Facility-Administered Medications Ordered in Other Visits  Medication Dose Route Frequency Provider Last Rate Last Admin   cyanocobalamin (VITAMIN B12) injection 1,000 mcg  1,000 mcg Intramuscular Once  Louretta Shorten R, MD       iron sucrose (VENOFER) injection 200 mg  200 mg Intravenous Once Louretta Shorten R, MD       sodium chloride flush (NS) 0.9 % injection 10 mL  10 mL Intracatheter Once PRN Earna Coder, MD         .  PHYSICAL EXAMINATION:   Vitals:   10/03/23 1453  BP: (!) 136/93  Pulse: 80  Temp: 98.2 F (36.8 C)  SpO2: 100%    Filed Weights   10/03/23 1453  Weight: 169 lb (76.7 kg)     Physical Exam Vitals and nursing note reviewed.  HENT:     Head: Normocephalic and atraumatic.     Mouth/Throat:     Pharynx: Oropharynx is clear.  Eyes:     Extraocular Movements: Extraocular movements intact.     Pupils: Pupils are equal, round, and reactive to light.  Cardiovascular:     Rate and Rhythm: Normal rate and regular rhythm.  Pulmonary:     Comments: Decreased breath sounds bilaterally.  Abdominal:     Palpations: Abdomen is soft.  Musculoskeletal:        General: Normal range of motion.     Cervical back: Normal range of motion.  Skin:    General: Skin is warm.  Neurological:     General: No focal deficit present.     Mental Status: He is alert and oriented to person, place, and time.  Psychiatric:        Behavior: Behavior normal.        Judgment: Judgment normal.      LABORATORY DATA:  I have reviewed the data as listed Lab Results  Component Value Date   WBC 5.9 10/03/2023   HGB 12.4 (L) 10/03/2023   HCT 38.4 (L) 10/03/2023   MCV 110.0 (H) 10/03/2023   PLT 194 10/03/2023   Recent Labs    09/08/23 2023 09/09/23 0504 09/15/23 1105 09/19/23 0840 09/27/23 1119 10/03/23 1443  NA 140 141 140  --  138 137  K 3.1* 3.5 3.4* 4.0 4.1 3.9  CL 115* 113* 101  --  101 103  CO2 16* 16* 33*  --  31 26  GLUCOSE 138* 93 114*  --  96 155*  BUN 13 12 14   --  18 20  CREATININE 1.32* 1.15 1.02  --  1.12 1.07  CALCIUM 8.7* 8.9 9.4  --  8.7 9.2  GFRNONAA 57* >60  --   --   --  >60  PROT 4.7* 4.8* 6.2  --  6.5  --   ALBUMIN 2.1*  2.1* 3.4*  --  3.5  --   AST 100* 79* 32  --  23  --   ALT 309* 271* 63*  --  20  --   ALKPHOS 62 57 52  --  64  --   BILITOT 0.7 1.0 0.4  --  0.3  --      US Abdomen Limited RUQ (LIVER/GB) Result Date: 09/07/2023 CLINICAL DATA:  [id number] RUQ pain 151471 EXAM: ULTRASOUND ABDOMEN LIMITED RIGHT UPPER QUADRANT COMPARISON:  CT abdomen pelvis 07/02/2023, CT abdomen pelvis 11/22/2022 FINDINGS: Gallbladder: Cholecystectomy. Common bile duct: Diameter: 12 mm-chronic dilated which can be seen in the post cholecystectomy setting. Liver: No focal lesion identified. Within normal limits in parenchymal echogenicity. Portal vein is patent on color Doppler imaging with normal direction of blood flow towards the liver. Other: Simple right renal cyst-no further follow-up indicated. IMPRESSION: Status post cholecystectomy with persistent common bile duct dilatation which can be seen in this setting. Electronically Signed   By: Tish Frederickson M.D.   On: 09/07/2023 00:29   CT HEAD WO CONTRAST ( ) Result Date: 09/07/2023 CLINICAL DATA:  Head trauma, minor (Age >= 65y) Past few days feeling weak and having poor oral intake. No fever, no n/v/d. Pt notes drainage to left ear. EMS reports negative halo sign.In the past 24 hours pt has been dizzy EXAM: CT HEAD WITHOUT CONTRAST TECHNIQUE: Contiguous axial images were obtained from the base of the skull through the vertex without intravenous contrast. RADIATION DOSE REDUCTION: This exam was performed according to the departmental dose-optimization program which includes automated exposure control, adjustment of the mA and/or kV according to patient size and/or use of iterative reconstruction technique. COMPARISON:  MRI head 07/09/2023, CT head 07/02/2023, CT head 12/17/2021 FINDINGS: Brain: Patchy and confluent areas of decreased attenuation are noted throughout the deep and periventricular white matter of the cerebral hemispheres bilaterally, compatible with chronic microvascular  ischemic disease. no evidence of large-territorial acute infarction. No parenchymal hemorrhage. No mass lesion. No extra-axial collection. No mass effect or midline shift. No hydrocephalus. Basilar cisterns are patent. Vascular: No hyperdense vessel. Skull: No acute fracture or focal lesion. Sinuses/Orbits: Left maxillary sinus mucosal thickening with associated hypertrophy of the sinus walls. Complete left mastoid air cell effusion. Partial right mastoid air cell effusion. Fluid noted within the left middle ear. Otherwise paranasal sinuses are clear. The orbits are unremarkable. Other: None. IMPRESSION: 1. No acute intracranial abnormality. 2. Chronic left maxillary sinus disease. 3. Complete left mastoid air cell effusion with associated middle ear fluid. 4. Partial right mastoid air cell effusion. Electronically Signed   By: Tish Frederickson M.D.   On: 09/07/2023 00:26   DG Chest Port 1 View Result Date: 09/06/2023 CLINICAL DATA:  Questionable sepsis - evaluate for abnormality EXAM: PORTABLE CHEST 1 VIEW COMPARISON:  07/02/2023 FINDINGS: The cardiomediastinal contours are normal. The lungs are clear. Pulmonary vasculature is normal. No consolidation, pleural effusion, or pneumothorax. Multiple remote left rib fractures. Lumbar and cervical hardware is partially included. Chronic ossified intra-articular body left axillary recess. IMPRESSION: No active disease. Electronically Signed   By: Narda Rutherford M.D.   On: 09/06/2023 22:25    ASSESSMENT & PLAN:   Iron deficiency # Iron deficiency: sec to gastric bypass.  Question low-grade MDS-but bone marrow biopsy would be recommended to confirm.  As long patient does not have worsening anemia-I would recommend continued surveillance without a bone marrow.    # Currently on maintenance iron infusion/ venofer.  Today hemoglobin is 12.3 .  Proceed with Venofer today.  # B 12 def [JUNE 2024-]- 233- s/p B12 shot- nov 2023; proceed with B12 shot every 3-4  months.  B12 shot today.  # Chronic back pain/osteoporosis [Dr.Poole;neurosurg]-status post surgery clinically stable.vit D NOV 2023- 43. Reclast [[facility]; endocrinology]  # CKD stage- II- III [Dr.Singh]- stable.   # Hx-of Hypotension with fall [JAN 205]- monitor for now.    #  Fatigue: Hx of sleep apnea [before gastric by pass]- ? Depression [limited activities/chronic pain ]  # DISPOSITION:  # proceed with venofer today; ok B12 shot injection # follow up in 4 months- MD; labs- cbc/bmp; vit D 25-OH; B12 levels-possible IV venofer; B12 injection-Dr.B    All questions were answered. The patient knows to call the clinic with any problems, questions or concerns.    Earna Coder, MD 10/03/2023 3:48 PM

## 2023-10-03 NOTE — Assessment & Plan Note (Addendum)
#   Iron deficiency: sec to gastric bypass.  Question low-grade MDS-but bone marrow biopsy would be recommended to confirm.  As long patient does not have worsening anemia-I would recommend continued surveillance without a bone marrow.    # Currently on maintenance iron infusion/ venofer.  Today hemoglobin is 12.3 .  Proceed with Venofer today.  # B 12 def [JUNE 2024-]- 233- s/p B12 shot- nov 2023; proceed with B12 shot every 3-4 months.  B12 shot today.  # Chronic back pain/osteoporosis [Dr.Poole;neurosurg]-status post surgery clinically stable.vit D NOV 2023- 43. Reclast [[facility]; endocrinology]  # CKD stage- II- III [Dr.Singh]- stable.   # Hx-of Hypotension with fall [JAN 205]- monitor for now.    # Fatigue: Hx of sleep apnea [before gastric by pass]- ? Depression [limited activities/chronic pain ]  # DISPOSITION:  # proceed with venofer today; ok B12 shot injection # follow up in 4 months- MD; labs- cbc/bmp; vit D 25-OH; B12 levels-possible IV venofer; B12 injection-Dr.B

## 2023-10-03 NOTE — Progress Notes (Signed)
 Fatigue/weakness: YES Dyspena: NO Light headedness: YES Blood in stool: NO   Fell about 3 weeks ago, no injuries. Was seen in ED for this , BP dropped. Fell in 11/24, resulted in a hematoma on the back.   C/o clear drainage lt ear x1 month.

## 2023-10-04 DIAGNOSIS — N1831 Chronic kidney disease, stage 3a: Secondary | ICD-10-CM | POA: Diagnosis not present

## 2023-10-04 DIAGNOSIS — D684 Acquired coagulation factor deficiency: Secondary | ICD-10-CM | POA: Diagnosis not present

## 2023-10-04 DIAGNOSIS — I5032 Chronic diastolic (congestive) heart failure: Secondary | ICD-10-CM | POA: Diagnosis not present

## 2023-10-04 DIAGNOSIS — E1122 Type 2 diabetes mellitus with diabetic chronic kidney disease: Secondary | ICD-10-CM | POA: Diagnosis not present

## 2023-10-04 DIAGNOSIS — I951 Orthostatic hypotension: Secondary | ICD-10-CM | POA: Diagnosis not present

## 2023-10-04 DIAGNOSIS — D631 Anemia in chronic kidney disease: Secondary | ICD-10-CM | POA: Diagnosis not present

## 2023-10-04 DIAGNOSIS — H6592 Unspecified nonsuppurative otitis media, left ear: Secondary | ICD-10-CM | POA: Diagnosis not present

## 2023-10-04 DIAGNOSIS — I130 Hypertensive heart and chronic kidney disease with heart failure and stage 1 through stage 4 chronic kidney disease, or unspecified chronic kidney disease: Secondary | ICD-10-CM | POA: Diagnosis not present

## 2023-10-04 DIAGNOSIS — N179 Acute kidney failure, unspecified: Secondary | ICD-10-CM | POA: Diagnosis not present

## 2023-10-05 ENCOUNTER — Ambulatory Visit: Payer: Medicare PPO | Admitting: Dermatology

## 2023-10-05 ENCOUNTER — Encounter: Payer: Self-pay | Admitting: Dermatology

## 2023-10-05 DIAGNOSIS — L729 Follicular cyst of the skin and subcutaneous tissue, unspecified: Secondary | ICD-10-CM

## 2023-10-05 DIAGNOSIS — W908XXA Exposure to other nonionizing radiation, initial encounter: Secondary | ICD-10-CM | POA: Diagnosis not present

## 2023-10-05 DIAGNOSIS — L578 Other skin changes due to chronic exposure to nonionizing radiation: Secondary | ICD-10-CM

## 2023-10-05 DIAGNOSIS — D229 Melanocytic nevi, unspecified: Secondary | ICD-10-CM

## 2023-10-05 DIAGNOSIS — Z86018 Personal history of other benign neoplasm: Secondary | ICD-10-CM

## 2023-10-05 DIAGNOSIS — L719 Rosacea, unspecified: Secondary | ICD-10-CM

## 2023-10-05 DIAGNOSIS — W19XXXA Unspecified fall, initial encounter: Secondary | ICD-10-CM

## 2023-10-05 DIAGNOSIS — L819 Disorder of pigmentation, unspecified: Secondary | ICD-10-CM

## 2023-10-05 DIAGNOSIS — L814 Other melanin hyperpigmentation: Secondary | ICD-10-CM

## 2023-10-05 DIAGNOSIS — Z1283 Encounter for screening for malignant neoplasm of skin: Secondary | ICD-10-CM

## 2023-10-05 DIAGNOSIS — D1801 Hemangioma of skin and subcutaneous tissue: Secondary | ICD-10-CM | POA: Diagnosis not present

## 2023-10-05 DIAGNOSIS — L821 Other seborrheic keratosis: Secondary | ICD-10-CM | POA: Diagnosis not present

## 2023-10-05 DIAGNOSIS — Z7189 Other specified counseling: Secondary | ICD-10-CM

## 2023-10-05 DIAGNOSIS — T148XXA Other injury of unspecified body region, initial encounter: Secondary | ICD-10-CM

## 2023-10-05 DIAGNOSIS — S300XXA Contusion of lower back and pelvis, initial encounter: Secondary | ICD-10-CM | POA: Diagnosis not present

## 2023-10-05 DIAGNOSIS — Z79899 Other long term (current) drug therapy: Secondary | ICD-10-CM

## 2023-10-05 MED ORDER — METRONIDAZOLE 0.75 % EX GEL: 1.0000 | Freq: Every day | CUTANEOUS | 11 refills | Status: AC

## 2023-10-05 NOTE — Patient Instructions (Signed)

## 2023-10-05 NOTE — Progress Notes (Signed)
 Follow-Up Visit   Subjective  Justin Patel is a 73 y.o. male who presents for the following: Skin Cancer Screening and Full Body Skin Exam, hx of Dysplastic Nevus, Rosacea face, Metronidazole 0.75% gel 2x/wk  The patient presents for Total-Body Skin Exam (TBSE) for skin cancer screening and mole check. The patient has spots, moles and lesions to be evaluated, some may be new or changing and the patient may have concern these could be cancer.  The following portions of the chart were reviewed this encounter and updated as appropriate: medications, allergies, medical history  Review of Systems:  No other skin or systemic complaints except as noted in HPI or Assessment and Plan.  Objective  Well appearing patient in no apparent distress; mood and affect are within normal limits.  A full examination was performed including scalp, head, eyes, ears, nose, lips, neck, chest, axillae, abdomen, back, buttocks, bilateral upper extremities, bilateral lower extremities, hands, feet, fingers, toes, fingernails, and toenails. All findings within normal limits unless otherwise noted below.   Relevant physical exam findings are noted in the Assessment and Plan.   Assessment & Plan   SKIN CANCER SCREENING PERFORMED TODAY.  ACTINIC DAMAGE - Chronic condition, secondary to cumulative UV/sun exposure - diffuse scaly erythematous macules with underlying dyspigmentation - Recommend daily broad spectrum sunscreen SPF 30+ to sun-exposed areas, reapply every 2 hours as needed.  - Staying in the shade or wearing long sleeves, sun glasses (UVA+UVB protection) and wide brim hats (4-inch brim around the entire circumference of the hat) are also recommended for sun protection.  - Call for new or changing lesions.  LENTIGINES, SEBORRHEIC KERATOSES, HEMANGIOMAS - Benign normal skin lesions - Benign-appearing - Call for any changes  MELANOCYTIC NEVI - Tan-brown and/or pink-flesh-colored symmetric macules  and papules - Benign appearing on exam today - Observation - Call clinic for new or changing moles - Recommend daily use of broad spectrum spf 30+ sunscreen to sun-exposed areas.   HISTORY OF DYSPLASTIC NEVUS No evidence of recurrence today Recommend regular full body skin exams Recommend daily broad spectrum sunscreen SPF 30+ to sun-exposed areas, reapply every 2 hours as needed.  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 Currently well-controlled.  Rosacea is a chronic progressive skin condition usually affecting the face of adults, causing redness and/or acne bumps. It is treatable but not curable. It sometimes affects the eyes (ocular rosacea) as well. It may respond to topical and/or systemic medication and can flare with stress, sun exposure, alcohol, exercise, topical steroids (including hydrocortisone/cortisone 10) and some foods.  Daily application of broad spectrum spf 30+ sunscreen to face is recommended to reduce flares. Patient denies grittiness of the eyes Treatment Plan Cont Metronidazole 0.75% gel qhs to face Long term medication management.  Patient is using long term (months to years) prescription medication  to control their dermatologic condition.  These medications require periodic monitoring to evaluate for efficacy and side effects and may require periodic laboratory monitoring.  HEMATOMA with DYSCHROMIA  Resolving, secondary to fall R low back Exam: hyperpigmentation and firmness Treatment Plan: Benign, observe.    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  Observation.  Call clinic for new or changing lesions.  Recommend daily use of broad spectrum spf 30+ sunscreen to  sun-exposed areas.   ACTINIC SKIN  DAMAGE   SKIN CANCER SCREENING   LENTIGO   MELANOCYTIC NEVUS, UNSPECIFIED LOCATION   ROSACEA   HISTORY OF DYSPLASTIC NEVUS   CYST OF SKIN   HEMATOMA   DYSCHROMIA   COUNSELING AND COORDINATION OF CARE   MEDICATION MANAGEMENT   Return in about 1 year (around 10/04/2024) for TBSE, Hx of Dysplastic nevi.  I, Ardis Rowan, RMA, am acting as scribe for Armida Sans, MD .   Documentation: I have reviewed the above documentation for accuracy and completeness, and I agree with the above.  Armida Sans, MD

## 2023-10-06 DIAGNOSIS — N179 Acute kidney failure, unspecified: Secondary | ICD-10-CM | POA: Diagnosis not present

## 2023-10-06 DIAGNOSIS — H6592 Unspecified nonsuppurative otitis media, left ear: Secondary | ICD-10-CM | POA: Diagnosis not present

## 2023-10-06 DIAGNOSIS — N1831 Chronic kidney disease, stage 3a: Secondary | ICD-10-CM | POA: Diagnosis not present

## 2023-10-06 DIAGNOSIS — I951 Orthostatic hypotension: Secondary | ICD-10-CM | POA: Diagnosis not present

## 2023-10-06 DIAGNOSIS — E1122 Type 2 diabetes mellitus with diabetic chronic kidney disease: Secondary | ICD-10-CM | POA: Diagnosis not present

## 2023-10-06 DIAGNOSIS — D684 Acquired coagulation factor deficiency: Secondary | ICD-10-CM | POA: Diagnosis not present

## 2023-10-06 DIAGNOSIS — I5032 Chronic diastolic (congestive) heart failure: Secondary | ICD-10-CM | POA: Diagnosis not present

## 2023-10-06 DIAGNOSIS — I130 Hypertensive heart and chronic kidney disease with heart failure and stage 1 through stage 4 chronic kidney disease, or unspecified chronic kidney disease: Secondary | ICD-10-CM | POA: Diagnosis not present

## 2023-10-06 DIAGNOSIS — D631 Anemia in chronic kidney disease: Secondary | ICD-10-CM | POA: Diagnosis not present

## 2023-10-12 ENCOUNTER — Other Ambulatory Visit: Payer: Self-pay | Admitting: Family Medicine

## 2023-10-12 ENCOUNTER — Other Ambulatory Visit: Payer: Self-pay

## 2023-10-12 DIAGNOSIS — R42 Dizziness and giddiness: Secondary | ICD-10-CM

## 2023-10-12 MED ORDER — MECLIZINE HCL 12.5 MG PO TABS: 12.5000 mg | ORAL_TABLET | Freq: Three times a day (TID) | ORAL | 0 refills | Status: DC | PRN

## 2023-10-12 MED ORDER — METHOCARBAMOL 750 MG PO TABS: ORAL_TABLET | ORAL | 0 refills | Status: DC

## 2023-10-12 NOTE — Telephone Encounter (Signed)
 Spoke with Justin Patel at [facility] to check the status of prescriptions. Justin Patel says the prescriptions was picked up around 100 pm on 09/27/23. Patient was scheduled with Dr Charlann Lange in April 2025.

## 2023-10-12 NOTE — Telephone Encounter (Signed)
 Copied from CRM [id number]. Topic: Clinical - Prescription Issue >> Oct 12, 2023  3:21 PM Denese Killings wrote: Reason for CRM: Patient stated that pharmacy doesn't have prescriptions for meclizine (ANTIVERT) 12.5 MG tablet and methocarbamol (ROBAXIN) 750 MG tablet. He is asking that you resend it.

## 2023-10-12 NOTE — Telephone Encounter (Signed)
 Pt scheduled with Clent Ridges for [facility] on 01-24-24 will need a new TOC with someone else. Please call and schedule pt with another provider

## 2023-10-12 NOTE — Telephone Encounter (Signed)
 I called patient and scheduled a transfer of care appointment with Dr. Charlann Lange for him.  Patient states he would like to know if we have sent in the medications he requested.  I do not see a confirmation in the system from the pharmacy.  I let him know that I will send a message back to let our clinical staff know he is following up on his medication request.  Prescription Request  10/12/2023  LOV: Visit date not found  What is the name of the medication or equipment? methocarbamol (ROBAXIN) 750 MG tablet and  meclizine (ANTIVERT) 12.5 MG tablet and   Have you contacted your pharmacy to request a refill? Yes   Which pharmacy would you like this sent to?  [facility] - [place], [place] - [address] [place] [address] Phone: [phone] Fax: [phone]    Patient notified that their request is being sent to the clinical staff for review and that they should receive a response within 2 business days.   Please advise at Mobile [phone] (mobile)  Patient states he is out of both of these medications and it is very important that he receives his refills.

## 2023-10-12 NOTE — Telephone Encounter (Signed)
 Pt has now been scheduled a toc

## 2023-10-12 NOTE — Addendum Note (Signed)
 Addended by: Donavan Foil on: 10/12/2023 04:21 PM   Modules accepted: Orders

## 2023-10-13 NOTE — Telephone Encounter (Signed)
 Noted.

## 2023-10-14 ENCOUNTER — Other Ambulatory Visit: Payer: Self-pay | Admitting: Nurse Practitioner

## 2023-10-17 ENCOUNTER — Other Ambulatory Visit: Payer: Self-pay | Admitting: Family Medicine

## 2023-10-17 NOTE — Telephone Encounter (Unsigned)
 Copied from CRM [id number]. Topic: Clinical - Medication Refill >> Oct 17, 2023  2:05 PM Dimitri Ped wrote: Most Recent Primary Care Visit:  Provider: Birdie Sons ERIC G  Department: [facility]  Visit Type: OFFICE VISIT  Date: 09/27/2023  Medication: gabapentin (NEURONTIN) 300 MG capsule   Has the patient contacted their pharmacy? Yes pharmacy sent in request but have not heard anything back  (Agent: If no, request that the patient contact the pharmacy for the refill. If patient does not wish to contact the pharmacy document the reason why and proceed with request.) (Agent: If yes, when and what did the pharmacy advise?)  Is this the correct pharmacy for this prescription? Yes If no, delete pharmacy and type the correct one.  This is the patient's preferred pharmacy:  [facility] - [place], [place] - [address] [place] [address] Phone: [phone] Fax: [phone]   Has the prescription been filled recently? No  Is the patient out of the medication? Yes  Has the patient been seen for an appointment in the last year OR does the patient have an upcoming appointment? Yes  Can we respond through MyChart? Yes  Agent: Please be advised that Rx refills may take up to 3 business days. We ask that you follow-up with your pharmacy.

## 2023-10-18 ENCOUNTER — Other Ambulatory Visit: Payer: Self-pay

## 2023-10-18 MED ORDER — GABAPENTIN 300 MG PO CAPS: 300.0000 mg | ORAL_CAPSULE | Freq: Two times a day (BID) | ORAL | 0 refills | Status: DC

## 2023-10-24 ENCOUNTER — Other Ambulatory Visit: Payer: Self-pay

## 2023-10-24 DIAGNOSIS — N400 Enlarged prostate without lower urinary tract symptoms: Secondary | ICD-10-CM

## 2023-10-24 MED ORDER — FINASTERIDE 5 MG PO TABS: 5.0000 mg | ORAL_TABLET | Freq: Every day | ORAL | 0 refills | Status: DC
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
## 2023-10-25 ENCOUNTER — Encounter: Payer: Self-pay | Admitting: Neurology

## 2023-10-25 ENCOUNTER — Ambulatory Visit: Payer: Medicare PPO | Admitting: Neurology

## 2023-10-25 VITALS — BP 139/90 | HR 97 | Ht 66.0 in | Wt 162.0 lb

## 2023-10-25 DIAGNOSIS — G250 Essential tremor: Secondary | ICD-10-CM | POA: Diagnosis not present

## 2023-10-25 MED ORDER — PRIMIDONE 50 MG PO TABS
50.0000 mg | ORAL_TABLET | Freq: Every day | ORAL | 5 refills | Status: DC
Start: 2023-10-25 — End: 2024-04-23

## 2023-11-08 ENCOUNTER — Ambulatory Visit

## 2023-11-08 VITALS — BP 136/88 | HR 91 | Temp 98.3°F | Ht 66.0 in | Wt 164.0 lb

## 2023-11-08 DIAGNOSIS — I700 Atherosclerosis of aorta: Secondary | ICD-10-CM

## 2023-11-08 DIAGNOSIS — M51369 Other intervertebral disc degeneration, lumbar region without mention of lumbar back pain or lower extremity pain: Secondary | ICD-10-CM | POA: Diagnosis not present

## 2023-11-08 DIAGNOSIS — N400 Enlarged prostate without lower urinary tract symptoms: Secondary | ICD-10-CM

## 2023-11-08 DIAGNOSIS — F419 Anxiety disorder, unspecified: Secondary | ICD-10-CM

## 2023-11-08 DIAGNOSIS — K219 Gastro-esophageal reflux disease without esophagitis: Secondary | ICD-10-CM | POA: Diagnosis not present

## 2023-11-08 DIAGNOSIS — I10 Essential (primary) hypertension: Secondary | ICD-10-CM | POA: Diagnosis not present

## 2023-11-08 DIAGNOSIS — G40909 Epilepsy, unspecified, not intractable, without status epilepticus: Secondary | ICD-10-CM | POA: Insufficient documentation

## 2023-11-08 DIAGNOSIS — E1169 Type 2 diabetes mellitus with other specified complication: Secondary | ICD-10-CM

## 2023-11-08 DIAGNOSIS — E782 Mixed hyperlipidemia: Secondary | ICD-10-CM

## 2023-11-08 DIAGNOSIS — E663 Overweight: Secondary | ICD-10-CM

## 2023-11-08 DIAGNOSIS — G9340 Encephalopathy, unspecified: Secondary | ICD-10-CM | POA: Insufficient documentation

## 2023-11-08 DIAGNOSIS — S301XXS Contusion of abdominal wall, sequela: Secondary | ICD-10-CM

## 2023-11-08 MED ORDER — VENLAFAXINE HCL ER 75 MG PO CP24: 75.0000 mg | ORAL_CAPSULE | Freq: Every day | ORAL | 1 refills | Status: DC

## 2023-11-08 MED ORDER — METHOCARBAMOL 750 MG PO TABS: ORAL_TABLET | ORAL | 1 refills | Status: DC

## 2023-11-08 MED ORDER — SUCRALFATE 1 G PO TABS: 1.0000 g | ORAL_TABLET | Freq: Four times a day (QID) | ORAL | 1 refills | Status: DC

## 2023-11-08 MED ORDER — LINAGLIPTIN 5 MG PO TABS: 5.0000 mg | ORAL_TABLET | Freq: Every day | ORAL | 1 refills | Status: DC

## 2023-11-08 MED ORDER — FINASTERIDE 5 MG PO TABS: 5.0000 mg | ORAL_TABLET | Freq: Every day | ORAL | 0 refills | Status: DC

## 2023-11-08 NOTE — Progress Notes (Signed)
 Established Patient Office Visit   Subjective  Patient ID: Justin Patel, male    DOB: [birth year]  Age: 73 y.o. MRN: [id number]  Chief Complaint  Patient presents with   Transitions Of Care   Back Pain    He  has a past medical history of Acute cough (12/22/2022), Acute renal failure superimposed on stage 3a chronic kidney disease (HCC) (05/08/2018), Anemia, Anxiety, Arthritis, Cancer (HCC) (02/01/2016), CHF (congestive heart failure) (HCC), Chronic kidney disease, Degenerative disc disease, Depression, Diabetes mellitus, Diarrhea (07/22/2023), DJD (degenerative joint disease), Dysplastic nevus (02/04/2016), Family history of adverse reaction to anesthesia, Foot fracture (02/02/2022), Foot fracture, left (01/2022), Gastric ulcer, GERD (gastroesophageal reflux disease), H/O hiatal hernia, Headache(784.0), Heart murmur, 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), History of gastric bypass, congestive heart failure, Hyperlipidemia, Hypertension, Iron deficiency anemia (02/28/2015), Lumbar scoliosis, Opiate abuse, continuous (HCC) (06/20/2015), Orthostatic hypotension (09/08/2023), Otitis media (09/07/2023), Pneumonia (11/20/2022), Rash (03/11/2017), RLQ abdominal pain (05/18/2018), S/P laparoscopic cholecystectomy (02/18/2020), Sacral fracture, closed (HCC), Seizures (HCC), Sleep apnea, Sore throat (03/15/2023), Stones in the urinary tract, Syncope and collapse, Transaminitis (09/07/2023), and Tylenol overdose, accidental or unintentional, initial encounter (09/07/2023).  HPI Established patient of Dr. Birdie Sons presenting to establish care. He does not have new concerns today.  1) Hypertension: Current medication includes amlodipine 5 mg, valsartan 80 mg daily. SBP ranging in the 130 mmHg and DBP in 80 mmHg.  Patient denies headache, chest pain, lower leg edema.  Patient was hospitalized in January for syncope.  He had orthostatic hypotension at the time.  He was also found to have severe  metabolic acidosis, elevated lactic acid, elevated LFTs secondary to Tylenol overuse.  At the time antihypertensive medication was adjusted by discontinuing metoprolol and decreasing dose of valsartan and amlodipine.   2) Muscle spasm: Back spasm for which she has been taking methocarbamol 1 tablet as needed. Patient is requesting Also follows up with pain and spine clinic. Patient sustained a fall in November 2024 leading to hospitalization.  He reports he gets back spasms.  He followed up with physical therapy.  Takes methocarbamol 750 mg 1 tablet daily as needed.  Requesting refill.    3) Tremors: Stable, follows up with neurologist Dr. Shon Millet who has the patient on primidone 50 mg at nightly.  Last appointment was on 10/25/2023, f/u every 6 months.   4) S/P gastric bypass: Patient has a history of gastric bypass surgery.  He does not follow-up with GI.  Last serum copper, zinc 06/25/2022 was normal, zinc was normal.  Patient B12 was 220 01/02/2022.  He had normal vitamin D of 41.35 on 06/25/2022.   ROS As per HPI    Objective:     BP 136/88   Pulse 91   Temp 98.3 F (36.8 C) (Oral)   Ht 5\' 6"  (1.676 m)   Wt 164 lb (74.4 kg)   SpO2 98%   BMI 26.47 kg/m    Physical Exam HENT:     Head: Normocephalic and atraumatic.  Cardiovascular:     Rate and Rhythm: Normal rate.  Pulmonary:     Effort: Pulmonary effort is normal.     Breath sounds: Normal breath sounds.  Abdominal:     General: Abdomen is protuberant.     Palpations: Abdomen is soft.  Musculoskeletal:     Cervical back: No rigidity.     Right lower leg: No edema.     Left lower leg: No edema.  Skin:    General: Skin is  warm.  Neurological:     Mental Status: He is alert and oriented to person, place, and time.  Psychiatric:        Mood and Affect: Mood normal.      No results found for any visits on 11/08/23.  The 10-year ASCVD risk score (Arnett DK, et al., 2019) is: 36.3%    Assessment & Plan:   Essential hypertension, benign Assessment & Plan: Blood pressure reviewed today.  DBP above goal of 80 mmHg.  Given patient's recent hospitalization, history of orthostatic hypotension recommend checking home blood pressure for the next 2 weeks before adjusting antihypertensive medication.  Patient will send [place] a MyChart message or give our office a call in about 2 weeks updating [place] about his home BP.  Will look into adjusting if DBP over 90 mmHg.  Continue valsartan 80 mg and amlodipine 5 mg daily.   Type 2 diabetes mellitus with other specified complication, without long-term current use of insulin [facility]) Assessment & Plan: Lab Results  Component Value Date   HGBA1C 5.8 (H) 07/15/2023   Continue Tradjenta 5 mg daily.   Orders: -     linaGLIPtin; Take 1 tablet (5 mg total) by mouth daily.  Dispense: 90 tablet; Refill: 1  Gastroesophageal reflux disease, unspecified whether esophagitis present Assessment & Plan: S/P gastric bypass.  On sucralfate therapy 1gm, four times a day.  Symptoms stable, will continue current therapy.  Currently not seeing GI.    Orders: -     Sucralfate; Take 1 tablet (1 g total) by mouth 4 (four) times daily.  Dispense: 360 tablet; Refill: 1  Degeneration of intervertebral disc of lumbar region, unspecified whether pain present -     Methocarbamol; TAKE ONE TABLET BY MOUTH EVERY EIGHT HOURS AS NEEDED FOR SPASM  Dispense: 30 tablet; Refill: 1  Overweight (BMI 25.0-29.9)  Anxiety -     Venlafaxine HCl ER; Take 1 capsule (75 mg total) by mouth daily.  Dispense: 90 capsule; Refill: 1  Benign prostatic hyperplasia, unspecified whether lower urinary tract symptoms present Assessment & Plan: Continue Finasteride 5 mg daily. Symptoms stable.    Orders: -     Finasteride; Take 1 tablet (5 mg total) by mouth daily.  Dispense: 30 tablet; Refill: 0  Aortic atherosclerosis (HCC) Assessment & Plan: Continue risk factor management including continuing statin  therapy   Mixed hyperlipidemia Assessment & Plan: Lab Results  Component Value Date   CHOL 144 08/11/2022   HDL 64.20 08/11/2022   LDLCALC 64 08/11/2022   LDLDIRECT 70.0 05/27/2021   TRIG 81.0 08/11/2022   CHOLHDL 2 08/11/2022  On rosuvastatin 40 mg daily.  Continue.   Hematoma of right flank, sequela Assessment & Plan: Sustained from fall, resolving.     Return in about 3 months (around 02/07/2024).   Skip Estimable, MD

## 2023-11-08 NOTE — Assessment & Plan Note (Signed)
 S/P gastric bypass.  On sucralfate therapy 1gm, four times a day.  Symptoms stable, will continue current therapy.  Currently not seeing GI.

## 2023-11-08 NOTE — Assessment & Plan Note (Addendum)
 Continue risk factor management including continuing statin therapy

## 2023-11-08 NOTE — Assessment & Plan Note (Signed)
 Lab Results  Component Value Date   CHOL 144 08/11/2022   HDL 64.20 08/11/2022   LDLCALC 64 08/11/2022   LDLDIRECT 70.0 05/27/2021   TRIG 81.0 08/11/2022   CHOLHDL 2 08/11/2022  On rosuvastatin 40 mg daily.  Continue.

## 2023-11-08 NOTE — Assessment & Plan Note (Addendum)
 Blood pressure reviewed today.  DBP above goal of 80 mmHg.  Given patient's recent hospitalization, history of orthostatic hypotension recommend checking home blood pressure for the next 2 weeks before adjusting antihypertensive medication.  Patient will send [place] a MyChart message or give our office a call in about 2 weeks updating [place] about his home BP.  Will look into adjusting if DBP over 90 mmHg.  Continue valsartan 80 mg and amlodipine 5 mg daily.

## 2023-11-08 NOTE — Assessment & Plan Note (Signed)
 Continue Finasteride 5 mg daily. Symptoms stable.

## 2023-11-08 NOTE — Assessment & Plan Note (Signed)
 Lab Results  Component Value Date   HGBA1C 5.8 (H) 07/15/2023   Continue Tradjenta 5 mg daily.

## 2023-11-08 NOTE — Assessment & Plan Note (Signed)
 Sustained from fall, resolving.

## 2023-11-10 ENCOUNTER — Other Ambulatory Visit: Payer: Self-pay

## 2023-11-17 ENCOUNTER — Encounter: Payer: Self-pay | Admitting: Medical

## 2023-11-17 ENCOUNTER — Ambulatory Visit: Payer: Medicare PPO | Attending: Medical | Admitting: Medical

## 2023-11-17 VITALS — BP 118/80 | HR 78 | Ht 65.0 in | Wt 160.0 lb

## 2023-11-17 DIAGNOSIS — D649 Anemia, unspecified: Secondary | ICD-10-CM

## 2023-11-17 DIAGNOSIS — S3013XD Contusion of flank (latus) region, subsequent encounter: Secondary | ICD-10-CM

## 2023-11-17 DIAGNOSIS — R55 Syncope and collapse: Secondary | ICD-10-CM

## 2023-11-17 DIAGNOSIS — S301XXD Contusion of abdominal wall, subsequent encounter: Secondary | ICD-10-CM | POA: Diagnosis not present

## 2023-11-17 DIAGNOSIS — I951 Orthostatic hypotension: Secondary | ICD-10-CM

## 2023-11-17 DIAGNOSIS — I5032 Chronic diastolic (congestive) heart failure: Secondary | ICD-10-CM

## 2023-11-17 DIAGNOSIS — I10 Essential (primary) hypertension: Secondary | ICD-10-CM | POA: Diagnosis not present

## 2023-11-17 NOTE — Patient Instructions (Signed)
 Medication Instructions:  Your physician recommends that you continue on your current medications as directed. Please refer to the Current Medication list given to you today.   *If you need a refill on your cardiac medications before your next appointment, please call your pharmacy*  Lab Work: No labs ordered today    Testing/Procedures: No test ordered today   Follow-Up: At [facility], you and your health needs are our priority.  As part of our continuing mission to provide you with exceptional heart care, our providers are all part of one team.  This team includes your primary Cardiologist (physician) and Advanced Practice Providers or APPs (Physician Assistants and Nurse Practitioners) who all work together to provide you with the care you need, when you need it.  Your next appointment:   4 month(s)  Provider:   You may see Lorine Bears, MD or one of the following Advanced Practice Providers on your designated Care Team:   Nicolasa Ducking, NP Ames Dura, PA-C Eula Listen, PA-C Cadence [place], PA-C Charlsie Quest, NP Carlos Levering, NP    We recommend signing up for the patient portal called "MyChart".  Sign up information is provided on this After Visit Summary.  MyChart is used to connect with patients for Virtual Visits (Telemedicine).  Patients are able to view lab/test results, encounter notes, upcoming appointments, etc.  Non-urgent messages can be sent to your provider as well.   To learn more about what you can do with MyChart, go to [email or web address].

## 2023-11-17 NOTE — Progress Notes (Unsigned)
 Cardiology Office Note:  .   Date:  11/18/2023  ID:  Justin Patel, DOB Aug 21, 1950, MRN [id number] PCP: Skip Estimable, MD  [facility] Providers Cardiologist:  Lorine Bears, MD     History of Present Illness: .   Justin Patel is a 73 y.o. male with a hx of  chronic diastolic heart failure, morbid obesity s/p gastric bypass 2010, iron deficiency anemia, diabetes mellitus, orthostatic hypotension, HTN, HLD, anxiety, prior alcohol abuse, degenerative disc disease with previous back surgery and GERD.    Echo in 2020 showed LVEF 55-60%, no valvular abnormalities.    The patient was seen in there ER 03/14/23 for syncope. Work-up showed K3.3, Scr 1.73. EKG with NSR and PACs. Hs trop negative. Vitals were normal. Patient was discharged home without intervention.   Patient was seen October 2024 after syncope episode.  Orthostatics were positive.  EKG showed normal sinus rhythm with PACs and PVCs.  Lasix, amlodipine and Toprol were stopped.  Heart monitor and echocardiogram ordered.  Echo showed EF 60 to 65%, mild LVH, grade 1 diastolic dysfunction.  Heart monitor showed predominantly normal sinus rhythm, 1 run of ventricular tachycardia, 62 runs of SVT, longest lasting 29 seconds, rare PACs, occasional PVCs with a burden of 1.7%.   Patient was last seen in November 2024.  He was back on his BP medications without any symptoms/issues.   The patient was admitted 06/2023 for 3 days for for fall from standing, right flank hematoma and ABL anemia. He presented 07/02/23 with a fall from standing after voiding. He turned and fell, hitting his right back on the side of the tub. He was found to have right flank hematoma. He was found to be profoundly orthostatic and given IVF and intermittently midodrine. MRI of the brain was negative. He was started on meclizine. PT recommended HH. He was not sent home on midodrine.   Since the visit patient's BP was high and he was started on amlodipine and  Valsartan for BP.   Patient was last seen 08/18/2023 for hospital follow-up.  Orthostatics were almost positive and it was recommended he take amlodipine in the a.m. and losartan in the p.m.  He was hospitalized in late 08/2023 with accidental Tylenol overdose, syncope, AKI on CKD3, orthostatic hypotension, oitis media   Today, the patient still has pain and bruising from the fall. He denies further orthostatic symptoms. BP at home 120-130/80. He also has chronic back pain.   Studies Reviewed: Marland Kitchen   EKG Interpretation Date/Time:  Thursday November 17 2023 15:17:37 EDT Ventricular Rate:  78 PR Interval:  140 QRS Duration:  78 QT Interval:  398 QTC Calculation: 453 R Axis:   70  Text Interpretation: Normal sinus rhythm Normal ECG When compared with ECG of 07-Sep-2023 00:37, PREVIOUS ECG IS PRESENT Confirmed by Terrilee Croak ([id number]) on 11/17/2023 3:30:27 PM    Heart monitor 05/2023 Patch Wear Time:  13 days and 21 hours (2024-10-05T11:21:07-399 to 2024-10-19T09:01:40-0400)   Patient had a min HR of 47 bpm, max HR of 214 bpm, and avg HR of 73 bpm. Predominant underlying rhythm was Sinus Rhythm. 1 run of Ventricular Tachycardia occurred lasting 4 beats with a max rate of 194 bpm (avg 169 bpm). 62 Supraventricular Tachycardia  runs occurred, the run with the fastest interval lasting 29.9 secs with a max rate of 214 bpm (avg 166 bpm); the run with the fastest interval was also the longest. Isolated SVEs were rare (<1.0%), SVE Couplets were rare (<1.0%), and  SVE Triplets were  rare (<1.0%). Isolated VEs were occasional (1.7%, 24606), VE Couplets were rare (<1.0%, 122), and VE Triplets were rare (<1.0%, 5). Ventricular Bigeminy and Trigeminy were present.    Echo 05/2023 1. Left ventricular ejection fraction, by estimation, is 60 to 65%. The  left ventricle has normal function. The left ventricle has no regional  wall motion abnormalities. There is mild left ventricular hypertrophy.  Left ventricular  diastolic parameters  are consistent with Grade I diastolic dysfunction (impaired relaxation).  The average left ventricular global longitudinal strain is -18.7 %. The  global longitudinal strain is normal.   2. Right ventricular systolic function is normal. The right ventricular  size is normal.   3. The mitral valve is normal in structure. No evidence of mitral valve  regurgitation.   4. The aortic valve is tricuspid. Aortic valve regurgitation is not  visualized.       Physical Exam:   VS:  BP 118/80 (BP Location: Left Arm, Patient Position: Sitting, Cuff Size: Normal)   Pulse 78   Ht 5\' 5"  (1.651 m)   Wt 160 lb (72.6 kg)   SpO2 97%   BMI 26.63 kg/m    Wt Readings from Last 3 Encounters:  11/17/23 160 lb (72.6 kg)  11/08/23 164 lb (74.4 kg)  10/25/23 162 lb (73.5 kg)    GEN: Well nourished, well developed in no acute distress NECK: No JVD; No carotid bruits CARDIAC: RRR, no murmurs, rubs, gallops RESPIRATORY:  Clear to auscultation without rales, wheezing or rhonchi  ABDOMEN: Soft, non-tender, non-distended EXTREMITIES:  No edema; No deformity   ASSESSMENT AND PLAN: .    Syncope Orthostatic Hypotension HTN Most recent admission for syncope in the setting of accidental Tylenol overdose and orthostatic hypotension.  Prior heart monitor and echocardiogram were unremarkable.  The patient denies further orthostasis. He is taking amlodipine 5 mg in the a.m. and valsartan 80 mg in the p.m.    Right flank hematoma Anemia This occurred in November 2024, he is still recovering. Hgb 12.4  Chronic diastolic heart failure Patient is euvolemic on exam. Echo in 2024 showed LVEF 60-65%, mild LVH, G1DD.     Dispo: Follow-up in 4 months  Signed, Ashaya Raftery David Stall, PA-C

## 2023-11-18 ENCOUNTER — Other Ambulatory Visit: Payer: Self-pay

## 2023-12-23 ENCOUNTER — Other Ambulatory Visit: Payer: Self-pay

## 2023-12-23 DIAGNOSIS — N400 Enlarged prostate without lower urinary tract symptoms: Secondary | ICD-10-CM

## 2024-01-20 ENCOUNTER — Other Ambulatory Visit: Payer: Self-pay

## 2024-01-20 DIAGNOSIS — E782 Mixed hyperlipidemia: Secondary | ICD-10-CM

## 2024-01-20 DIAGNOSIS — E119 Type 2 diabetes mellitus without complications: Secondary | ICD-10-CM

## 2024-01-20 NOTE — Telephone Encounter (Unsigned)
 Copied from CRM [id number]. Topic: Clinical - Medication Refill >> Jan 20, 2024 12:42 PM Alyse July wrote: Medication: rosuvastatin  (CRESTOR ) 40 MG tablet  Has the patient contacted their pharmacy? Yes  This is the patient's preferred pharmacy:  [facility] - [place], [place] - [address] [place] [address] Phone: [phone] Fax: [phone]  Is this the correct pharmacy for this prescription? Yes If no, delete pharmacy and type the correct one.   Has the prescription been filled recently? No  Is the patient out of the medication? Yes  Has the patient been seen for an appointment in the last year OR does the patient have an upcoming appointment? Yes  Can we respond through MyChart? Yes  Agent: Please be advised that Rx refills may take up to 3 business days. We ask that you follow-up with your pharmacy.

## 2024-01-21 MED ORDER — ROSUVASTATIN CALCIUM 40 MG PO TABS: 40.0000 mg | ORAL_TABLET | Freq: Every day | ORAL | 1 refills | Status: DC

## 2024-01-21 NOTE — Telephone Encounter (Signed)
 Refill to Rosuvastatin  40 mg sent. Needs lipid panel repeated. Lab ordered, will discuss this during upcoming appointment on 02/07/24.  1. Mixed hyperlipidemia (Primary) - rosuvastatin  (CRESTOR ) 40 MG tablet; Take 1 tablet (40 mg total) by mouth at bedtime.  Dispense: 90 tablet; Refill: 1 - Lipid panel; Future  Jacklin Mascot, MD

## 2024-01-24 ENCOUNTER — Encounter: Payer: Medicare PPO | Admitting: Family Medicine

## 2024-01-26 ENCOUNTER — Telehealth: Payer: Self-pay

## 2024-01-26 ENCOUNTER — Other Ambulatory Visit: Payer: Self-pay

## 2024-01-26 DIAGNOSIS — E782 Mixed hyperlipidemia: Secondary | ICD-10-CM

## 2024-01-26 MED ORDER — ROSUVASTATIN CALCIUM 40 MG PO TABS: 40.0000 mg | ORAL_TABLET | Freq: Every day | ORAL | 1 refills | Status: DC

## 2024-01-26 NOTE — Telephone Encounter (Signed)
 Pt wife called about Rx Rosuvastin stating it was never sent to the pharmacy and wanting to know which location it's going to. Please advise.

## 2024-01-26 NOTE — Telephone Encounter (Signed)
 See telephone encounter in patient's chart regarding prescription. Patient been notified via MyChart as well.

## 2024-01-26 NOTE — Telephone Encounter (Signed)
 Spoke with patient and he verbalized that he just picked up his prescription. Patient was notified it was sent incorrectly and apologized to patient.

## 2024-01-26 NOTE — Telephone Encounter (Signed)
 Prescription has been corrected, as the last prescription that was sent had No Print selected instead of Normal. It has been corrected and resent to patient's pharmacy.

## 2024-01-30 DIAGNOSIS — M5414 Radiculopathy, thoracic region: Secondary | ICD-10-CM | POA: Diagnosis not present

## 2024-01-30 DIAGNOSIS — F1120 Opioid dependence, uncomplicated: Secondary | ICD-10-CM | POA: Diagnosis not present

## 2024-01-30 DIAGNOSIS — M5416 Radiculopathy, lumbar region: Secondary | ICD-10-CM | POA: Diagnosis not present

## 2024-01-30 DIAGNOSIS — M546 Pain in thoracic spine: Secondary | ICD-10-CM | POA: Diagnosis not present

## 2024-01-31 ENCOUNTER — Inpatient Hospital Stay: Payer: Medicare PPO

## 2024-01-31 ENCOUNTER — Inpatient Hospital Stay: Payer: Medicare PPO | Admitting: Internal Medicine

## 2024-02-07 ENCOUNTER — Ambulatory Visit (INDEPENDENT_AMBULATORY_CARE_PROVIDER_SITE_OTHER)

## 2024-02-07 VITALS — BP 120/84 | HR 91 | Temp 99.1°F | Ht 66.0 in | Wt 162.4 lb

## 2024-02-07 DIAGNOSIS — Z9884 Bariatric surgery status: Secondary | ICD-10-CM | POA: Diagnosis not present

## 2024-02-07 DIAGNOSIS — E119 Type 2 diabetes mellitus without complications: Secondary | ICD-10-CM | POA: Diagnosis not present

## 2024-02-07 DIAGNOSIS — L989 Disorder of the skin and subcutaneous tissue, unspecified: Secondary | ICD-10-CM

## 2024-02-07 DIAGNOSIS — I10 Essential (primary) hypertension: Secondary | ICD-10-CM | POA: Diagnosis not present

## 2024-02-07 DIAGNOSIS — R251 Tremor, unspecified: Secondary | ICD-10-CM | POA: Diagnosis not present

## 2024-02-07 DIAGNOSIS — E1169 Type 2 diabetes mellitus with other specified complication: Secondary | ICD-10-CM | POA: Diagnosis not present

## 2024-02-07 DIAGNOSIS — K219 Gastro-esophageal reflux disease without esophagitis: Secondary | ICD-10-CM

## 2024-02-07 DIAGNOSIS — S301XXS Contusion of abdominal wall, sequela: Secondary | ICD-10-CM | POA: Diagnosis not present

## 2024-02-07 DIAGNOSIS — M5416 Radiculopathy, lumbar region: Secondary | ICD-10-CM

## 2024-02-07 DIAGNOSIS — N400 Enlarged prostate without lower urinary tract symptoms: Secondary | ICD-10-CM

## 2024-02-07 DIAGNOSIS — E782 Mixed hyperlipidemia: Secondary | ICD-10-CM | POA: Diagnosis not present

## 2024-02-07 DIAGNOSIS — Z79899 Other long term (current) drug therapy: Secondary | ICD-10-CM | POA: Diagnosis not present

## 2024-02-07 DIAGNOSIS — F39 Unspecified mood [affective] disorder: Secondary | ICD-10-CM

## 2024-02-07 MED ORDER — SUCRALFATE 1 G PO TABS
1.0000 g | ORAL_TABLET | Freq: Four times a day (QID) | ORAL | 1 refills | Status: AC
Start: 2024-02-07 — End: ?

## 2024-02-07 MED ORDER — EZETIMIBE 10 MG PO TABS: 10.0000 mg | ORAL_TABLET | Freq: Every day | ORAL | 3 refills | Status: AC

## 2024-02-07 MED ORDER — GABAPENTIN 300 MG PO CAPS: 300.0000 mg | ORAL_CAPSULE | Freq: Two times a day (BID) | ORAL | 1 refills | Status: DC

## 2024-02-07 MED ORDER — VENLAFAXINE HCL ER 75 MG PO CP24: 75.0000 mg | ORAL_CAPSULE | Freq: Every day | ORAL | 3 refills | Status: AC

## 2024-02-07 MED ORDER — ROSUVASTATIN CALCIUM 40 MG PO TABS: 40.0000 mg | ORAL_TABLET | Freq: Every day | ORAL | 3 refills | Status: AC

## 2024-02-07 MED ORDER — FINASTERIDE 5 MG PO TABS: 5.0000 mg | ORAL_TABLET | Freq: Every day | ORAL | 1 refills | Status: AC

## 2024-02-07 MED ORDER — ESOMEPRAZOLE MAGNESIUM 40 MG PO CPDR: 40.0000 mg | DELAYED_RELEASE_CAPSULE | Freq: Every day | ORAL | 1 refills | Status: DC

## 2024-02-07 NOTE — Assessment & Plan Note (Signed)
 On Finasteride  5 mg, continue

## 2024-02-07 NOTE — Assessment & Plan Note (Signed)
 Left dorsal fifth finger, no sign of infection. Recommend using bacitracin (patient has this at home) apply twice a day for 7 days. Watch for signs of infection, counseled increased r/o delayed wound healing given h/o diabetes. Reach out to our office for evaluation if worsening.

## 2024-02-07 NOTE — Assessment & Plan Note (Addendum)
 Chronic, Type 2 Diabetes Mellitus HbA1c: 07/2023 was 5.8%, Repeat A1c today.  LDL: Check lipid panel today, LDL goal <70 Urine microalbumin: UDT, sees [facility] Medications:  Continue Linagliptam 5 mg.  Continue Rosuvastatin  40 mg daily.

## 2024-02-07 NOTE — Assessment & Plan Note (Signed)
 Significantly improved compared to OV from 11/08/23.

## 2024-02-07 NOTE — Assessment & Plan Note (Signed)
 S/P gastric bypass.   Chronic, stable on sucralfate  therapy 1gm, four times a day and Nexium  40 mg daily. Magnesium  normal on 09/09/23.

## 2024-02-07 NOTE — Assessment & Plan Note (Signed)
 Check Selenium, copper  level. Last B 12, vitamin D  normal on 09/2023.  Patient also established with heme/onc for iron  deficiency anemia management.  Has B12, CBC future order from Dr. Rennie.

## 2024-02-07 NOTE — Assessment & Plan Note (Signed)
 Management per neurology.

## 2024-02-07 NOTE — Assessment & Plan Note (Signed)
 Check lipid panel today, continue Zetia  10 mg, Rosuvastatin  40 mg daily.

## 2024-02-07 NOTE — Progress Notes (Signed)
 Established Patient Office Visit   Subjective  Patient ID: Justin Patel, male    DOB: [birth year]  Age: 73 y.o. MRN: [id number]  Chief Complaint  Patient presents with   Diabetes   Hypertension   Anxiety    He  has a past medical history of Acute cough (12/22/2022), Acute renal failure superimposed on stage 3a chronic kidney disease (HCC) (05/08/2018), Anemia, Anxiety, Arthritis, Cancer (HCC) (02/01/2016), CHF (congestive heart failure) (HCC), Chronic abdominal pain (02/27/2019), Chronic kidney disease, Degenerative disc disease, Depression, Diabetes mellitus, Diarrhea (07/22/2023), DJD (degenerative joint disease), Dysplastic nevus (02/04/2016), Dyspnea on exertion (08/24/2018), Family history of adverse reaction to anesthesia, Foot fracture (02/02/2022), Foot fracture, left (01/2022), Fracture of L1 vertebra (HCC) (01/14/2020), Gastric ulcer, GERD (gastroesophageal reflux disease), Grief (03/15/2023), H/O hiatal hernia, Headache(784.0), Heart murmur, 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), History of gastric bypass, congestive heart failure, Hyperlipidemia, Hypertension, Intertrigo (02/24/2017), Iron  deficiency anemia (02/28/2015), Itchy skin (12/04/2019), Lumbar scoliosis, Numbness of face (05/22/2019), Opiate abuse, continuous (HCC) (06/20/2015), Orthostatic hypotension (09/08/2023), Otitis media (09/07/2023), Pneumonia (11/20/2022), Rash (03/11/2017), RLQ abdominal pain (05/18/2018), S/P laparoscopic cholecystectomy (02/18/2020), Sacral fracture, closed (HCC), Seizures (HCC), Sleep apnea, Sore throat (03/15/2023), Stones in the urinary tract, Syncope and collapse, Transaminitis (09/07/2023), Tylenol  overdose, accidental or unintentional, initial encounter (09/07/2023), Vertigo (09/27/2023), and Vitamin D  deficiency (07/20/2017).  Diabetes  Hypertension Associated symptoms include anxiety.  Anxiety     1) Chronic lower back pain: Established with pain clinic. Denies  radiating down his legs.  Gabapentin  300 mg (twice a day). He is not taking Methocarbamol  750 mg for months now.  On Hydrocodone -Acetamin 7.5-325 four times a day.   2) Essential hypertension: Home BP 120-125 mmHg, 75-80 mmHg. No headache, no lower leg edema. Currently taking Amlodipine  5 mg, Valsartan  80 mg daily.   3) GERD: On Nexium  40 mg once a day. Symptoms stable. B12 10/03/23 was normal. Magnesium  normal on 09/09/23. Sucralfate  1 gm four times a day.   4) Diabetes, type II:  A1c 07/15/23 was 5.8%. He has been eating healthy. On Linagliptin  5 mg,   5) Hyperlipidemia: On Zetia  10 mg, Rosuvastatin  40 mg.   6) Mood disorder: On Venlafaxine  XR 75 mg daily.   7) Tremors: Established with neurologist , on    ROS As per HPI    Objective:     BP 120/84 (BP Location: Left Arm, Patient Position: Sitting, Cuff Size: Normal)   Pulse 91   Temp 99.1 F (37.3 C) (Oral)   Ht 5' 6 (1.676 m)   Wt 162 lb 6.4 oz (73.7 kg)   SpO2 97%   BMI 26.21 kg/m      02/07/2024    2:04 PM 11/08/2023    3:08 PM 09/15/2023   10:04 AM  Depression screen PHQ 2/9  Decreased Interest 1 2 0  Down, Depressed, Hopeless 0 1 0  PHQ - 2 Score 1 3 0  Altered sleeping 1 0 0  Tired, decreased energy 1 1 0  Change in appetite 0 0 0  Feeling bad or failure about yourself  0 0 0  Trouble concentrating 0 0 0  Moving slowly or fidgety/restless 0 0 0  Suicidal thoughts 0 0 0  PHQ-9 Score 3 4 0  Difficult doing work/chores Not difficult at all Not difficult at all Not difficult at all      02/07/2024    2:04 PM 11/08/2023    3:08 PM 09/15/2023   10:04 AM 07/22/2023   11:15 AM  GAD 7 : Generalized Anxiety Score  Nervous, Anxious, on Edge 0 0 0 0  Control/stop worrying 0 0 0 0  Worry too much - different things 0 0 0 0  Trouble relaxing 0 0 0 0  Restless 0 0 0 0  Easily annoyed or irritable 0 0 0 0  Afraid - awful might happen 0 0 0 0  Total GAD 7 Score 0 0 0 0  Anxiety Difficulty Not difficult at all Not  difficult at all Not difficult at all Not difficult at all      02/07/2024    2:04 PM 11/08/2023    3:08 PM 09/15/2023   10:04 AM  Depression screen PHQ 2/9  Decreased Interest 1 2 0  Down, Depressed, Hopeless 0 1 0  PHQ - 2 Score 1 3 0  Altered sleeping 1 0 0  Tired, decreased energy 1 1 0  Change in appetite 0 0 0  Feeling bad or failure about yourself  0 0 0  Trouble concentrating 0 0 0  Moving slowly or fidgety/restless 0 0 0  Suicidal thoughts 0 0 0  PHQ-9 Score 3 4 0  Difficult doing work/chores Not difficult at all Not difficult at all Not difficult at all      02/07/2024    2:04 PM 11/08/2023    3:08 PM 09/15/2023   10:04 AM 07/22/2023   11:15 AM  GAD 7 : Generalized Anxiety Score  Nervous, Anxious, on Edge 0 0 0 0  Control/stop worrying 0 0 0 0  Worry too much - different things 0 0 0 0  Trouble relaxing 0 0 0 0  Restless 0 0 0 0  Easily annoyed or irritable 0 0 0 0  Afraid - awful might happen 0 0 0 0  Total GAD 7 Score 0 0 0 0  Anxiety Difficulty Not difficult at all Not difficult at all Not difficult at all Not difficult at all   SDOH Screenings   Food Insecurity: No Food Insecurity (09/12/2023)  Housing: Unknown (11/14/2023)   Received from [facility]  Transportation Needs: No Transportation Needs (09/12/2023)  Utilities: Not At Risk (09/12/2023)  Alcohol Screen: Low Risk  (07/13/2023)  Depression (PHQ2-9): Low Risk  (02/07/2024)  Financial Resource Strain: Low Risk  (07/13/2023)  Physical Activity: Inactive (07/13/2023)  Social Connections: Socially Integrated (07/13/2023)  Stress: No Stress Concern Present (07/13/2023)  Tobacco Use: Low Risk  (02/07/2024)  Health Literacy: Adequate Health Literacy (07/13/2023)     Physical Exam Constitutional:      General: He is not in acute distress.    Appearance: Normal appearance.  HENT:     Head: Normocephalic and atraumatic.     Mouth/Throat:     Mouth: Mucous membranes are moist.   Eyes:     Pupils: Pupils  are equal, round, and reactive to light.   Neck:     Thyroid : No thyroid  mass or thyroid  tenderness.   Cardiovascular:     Rate and Rhythm: Normal rate and regular rhythm.     Pulses:          Dorsalis pedis pulses are 2+ on the right side and 2+ on the left side.       Posterior tibial pulses are 2+ on the right side and 2+ on the left side.  Pulmonary:     Effort: Pulmonary effort is normal.     Breath sounds: Normal breath sounds. No wheezing.  Abdominal:  General: Bowel sounds are normal.     Palpations: Abdomen is soft.     Tenderness: There is no abdominal tenderness. There is no guarding.   Musculoskeletal:     Cervical back: Neck supple. No rigidity.     Right lower leg: No edema.     Left lower leg: No edema.     Right foot: Bunion and prominent metatarsal heads present.     Left foot: Bunion and prominent metatarsal heads present.     Comments: Bluish discoloration on right lower back.    Feet:     Right foot:     Skin integrity: Dry skin present. No fissure.     Toenail Condition: Right toenails are normal.     Left foot:     Skin integrity: No dry skin or fissure.     Toenail Condition: Left toenails are normal.   Skin:    General: Skin is warm.     Comments: Left dorsal finger: small skin healing laceration without discharge, erythema.    Neurological:     Mental Status: He is alert and oriented to person, place, and time.   Psychiatric:        Mood and Affect: Mood normal.        Behavior: Behavior normal.        No results found for any visits on 02/07/24.  The 10-year ASCVD risk score (Arnett DK, et al., 2019) is: 30.2%     Assessment & Plan:   Type 2 diabetes mellitus with other specified complication, without long-term current use of insulin  [facility]) Assessment & Plan: Chronic, Type 2 Diabetes Mellitus HbA1c: 07/2023 was 5.8%, Repeat A1c today.  LDL: Check lipid panel today, LDL goal <70 Urine microalbumin: UDT, sees [facility] Medications:  Continue Linagliptam 5 mg.  Continue Rosuvastatin  40 mg daily.     Orders: -     Hemoglobin A1c -     Microalbumin / creatinine urine ratio  Essential hypertension, benign Assessment & Plan: Chronic, BP stable today.  Continue f/u with cardiology.  Continue Amlodipine  5 mg, Valsartan  80 mg daily.    Mixed hyperlipidemia Assessment & Plan: Check lipid panel today, continue Zetia  10 mg, Rosuvastatin  40 mg daily.   Orders: -     Lipid panel -     Ezetimibe ; Take 1 tablet (10 mg total) by mouth daily.  Dispense: 90 tablet; Refill: 3 -     Rosuvastatin  Calcium ; Take 1 tablet (40 mg total) by mouth at bedtime.  Dispense: 90 tablet; Refill: 3  Encounter for diabetic foot exam [facility]) Assessment & Plan: Chronic, Type 2 Diabetes Mellitus HbA1c: 07/2023 was 5.8%, Repeat A1c today.  LDL: Check lipid panel today, LDL goal <70 Urine microalbumin: UDT, sees [facility] Medications:  Continue Linagliptam 5 mg.  Continue Rosuvastatin  40 mg daily.      Tremor Assessment & Plan: Management per neurology.    S/P gastric bypass Assessment & Plan: Check Selenium, copper  level. Last B 12, vitamin D  normal on 09/2023.  Patient also established with heme/onc for iron  deficiency anemia management.  Has B12, CBC future order from Dr. Rennie.  Orders: -     Selenium serum -     Vitamin E  Hematoma of right flank, sequela Assessment & Plan: Significantly improved compared to OV from 11/08/23.    Lumbar radiculopathy Assessment & Plan: Following up with pain clinic. Discussed realistic expectations on pain control with the patient today.  Gabapentin  300 mg (  twice a day), PDMP reviewed. Refill sent after counseling patient on s/e of medication.  Discontinue Methocarbamol  750 mg as he has not taken this for months now.   Also taking Hydrocodone -Acetamin 7.5-325 four times a day per pain clinic. Continue f/u and further management per pain clinic.    Orders: -     Gabapentin ; Take 1 capsule (300 mg total) by mouth 2 (two) times daily.  Dispense: 180 capsule; Refill: 1  Gastroesophageal reflux disease, unspecified whether esophagitis present Assessment & Plan: S/P gastric bypass.   Chronic, stable on sucralfate  therapy 1gm, four times a day and Nexium  40 mg daily. Magnesium  normal on 09/09/23.   Orders: -     Esomeprazole  Magnesium ; Take 1 capsule (40 mg total) by mouth daily.  Dispense: 180 capsule; Refill: 1 -     Sucralfate ; Take 1 tablet (1 g total) by mouth 4 (four) times daily.  Dispense: 360 tablet; Refill: 1  Skin lesion Assessment & Plan: Left dorsal fifth finger, no sign of infection. Recommend using bacitracin (patient has this at home) apply twice a day for 7 days. Watch for signs of infection, counseled increased r/o delayed wound healing given h/o diabetes. Reach out to our office for evaluation if worsening.    Mood disorder (HCC) Assessment & Plan: Stable on Venlafaxine  XR 75 mg daily, continue.   Orders: -     Venlafaxine  HCl ER; Take 1 capsule (75 mg total) by mouth daily.  Dispense: 90 capsule; Refill: 3  Benign prostatic hyperplasia, unspecified whether lower urinary tract symptoms present Assessment & Plan: On Finasteride  5 mg, continue  Orders: -     Finasteride ; Take 1 tablet (5 mg total) by mouth daily.  Dispense: 90 tablet; Refill: 1  I spent 50 minutes on the day of this face-to-face encounter reviewing the patient's medical and surgical history, medications, ongoing concerns, and reviewing the assessment and plan with the patient. This time also included counseling the patient on their health conditions and management options. Additionally, I spent time post-visit ordering and reviewing diagnostics and therapeutics with the patient.   Return in about 6 months (around 08/09/2024) for Chronic .   Luke Shade, MD

## 2024-02-07 NOTE — Assessment & Plan Note (Addendum)
 Chronic, BP stable today.  Continue f/u with cardiology.  Continue Amlodipine  5 mg, Valsartan  80 mg daily.

## 2024-02-07 NOTE — Assessment & Plan Note (Signed)
 Stable on Venlafaxine  XR 75 mg daily, continue.

## 2024-02-07 NOTE — Assessment & Plan Note (Signed)
 Following up with pain clinic. Discussed realistic expectations on pain control with the patient today.  Gabapentin  300 mg (twice a day), PDMP reviewed. Refill sent after counseling patient on s/e of medication.  Discontinue Methocarbamol  750 mg as he has not taken this for months now.   Also taking Hydrocodone -Acetamin 7.5-325 four times a day per pain clinic. Continue f/u and further management per pain clinic.

## 2024-02-08 ENCOUNTER — Ambulatory Visit: Payer: Self-pay

## 2024-02-08 DIAGNOSIS — E1121 Type 2 diabetes mellitus with diabetic nephropathy: Secondary | ICD-10-CM

## 2024-02-08 LAB — LIPID PANEL
Cholesterol: 150 mg/dL (ref 0–200)
HDL: 63.3 mg/dL (ref >39.00)
LDL Cholesterol: 66 mg/dL (ref 0–99)
NonHDL: 86.7
Total CHOL/HDL Ratio: 2
Triglycerides: 105 mg/dL (ref 0.0–149.0)
VLDL: 21 mg/dL (ref 0.0–40.0)

## 2024-02-08 LAB — MICROALBUMIN / CREATININE URINE RATIO
Creatinine,U: 159.4 mg/dL
Microalb Creat Ratio: 874.3 mg/g — ABNORMAL HIGH (ref 0.0–30.0)
Microalb, Ur: 139.4 mg/dL — ABNORMAL HIGH (ref 0.0–1.9)

## 2024-02-08 LAB — HEMOGLOBIN A1C: Hgb A1c MFr Bld: 7 % — ABNORMAL HIGH (ref 4.6–6.5)

## 2024-02-08 NOTE — Progress Notes (Signed)
 Please let the patient know his recent lab shows worsening diabetes. His A1c is 7% compared to 5.8% from December of last year. His urine also shows presence of microalbumin suggestive of diabetic kidney disease. We can: - Either restart Farxiga  10 mg (I am aware of previous groin infection on Farxiga ) with close monitoring of symptoms or,  - Start a new medication called Rybelsus. Rybelsus is contraindicated in patient with history of thyroid  cancer, multiple endocrine neoplasia. Please confirm he does not have have above contraindications.  We will start this at 3 mg orally once daily for 30 days. After 30 days, increase to 7 mg orally once daily.  - Cholesterol looks great.   I also recommend follow up in clinic in 4-6 weeks after starting either one of these medications and repeat urine test.   Thank you,  Luke Shade, MD

## 2024-02-09 DIAGNOSIS — E1121 Type 2 diabetes mellitus with diabetic nephropathy: Secondary | ICD-10-CM | POA: Insufficient documentation

## 2024-02-09 DIAGNOSIS — E119 Type 2 diabetes mellitus without complications: Secondary | ICD-10-CM | POA: Insufficient documentation

## 2024-02-09 MED ORDER — RYBELSUS 7 MG PO TABS: 7.0000 mg | ORAL_TABLET | Freq: Every day | ORAL | 0 refills | Status: DC

## 2024-02-09 MED ORDER — RYBELSUS 3 MG PO TABS: 3.0000 mg | ORAL_TABLET | Freq: Every day | ORAL | 0 refills | Status: DC

## 2024-02-09 NOTE — Telephone Encounter (Signed)
 1. 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  Dispense: 30 tablet; Refill: 0 - Semaglutide (RYBELSUS) 7 MG TABS; Take 1 tablet (7 mg total) by mouth daily.  Dispense: 90 tablet; Refill: 0 - Urine Microalbumin w/creat. ratio; Future   Luke Shade, MD

## 2024-02-10 LAB — VITAMIN E
Gamma-Tocopherol (Vit E): <1 mg/L (ref <4.4)
Vitamin E (Alpha Tocopherol): 11.4 mg/L (ref 5.7–19.9)

## 2024-02-10 LAB — SELENIUM SERUM: Selenium, Blood: 104 ug/L (ref 63–160)

## 2024-02-20 NOTE — Telephone Encounter (Unsigned)
 Copied from CRM [id number]. Topic: Clinical - Medication Question >> Feb 20, 2024 11:42 AM [place] E wrote: Reason for CRM: Patient called in stating that he was prescribed Semaglutide  (RYBELSUS ) 3 MG TABS and does not want to take this medication as he read the side effects and does not feel comfortable taking it. Patient questioning if there are alternatives for him to take, but does not want to be prescribed Farxiga  due to those side effects as well (got a yeast infection). Callback number for patient is [phone].

## 2024-02-29 ENCOUNTER — Observation Stay
Admission: EM | Admit: 2024-02-29 | Discharge: 2024-03-02 | Disposition: A | Discharge status: Home / Self Care | Attending: Family Medicine | Admitting: Family Medicine

## 2024-02-29 ENCOUNTER — Other Ambulatory Visit: Payer: Self-pay

## 2024-02-29 ENCOUNTER — Observation Stay

## 2024-02-29 ENCOUNTER — Emergency Department

## 2024-02-29 DIAGNOSIS — N400 Enlarged prostate without lower urinary tract symptoms: Secondary | ICD-10-CM | POA: Insufficient documentation

## 2024-02-29 DIAGNOSIS — R9082 White matter disease, unspecified: Secondary | ICD-10-CM | POA: Diagnosis not present

## 2024-02-29 DIAGNOSIS — N261 Atrophy of kidney (terminal): Secondary | ICD-10-CM | POA: Diagnosis not present

## 2024-02-29 DIAGNOSIS — Z85828 Personal history of other malignant neoplasm of skin: Secondary | ICD-10-CM | POA: Insufficient documentation

## 2024-02-29 DIAGNOSIS — G929 Unspecified toxic encephalopathy: Secondary | ICD-10-CM | POA: Diagnosis not present

## 2024-02-29 DIAGNOSIS — G9340 Encephalopathy, unspecified: Secondary | ICD-10-CM | POA: Diagnosis not present

## 2024-02-29 DIAGNOSIS — E860 Dehydration: Secondary | ICD-10-CM | POA: Diagnosis not present

## 2024-02-29 DIAGNOSIS — R7401 Elevation of levels of liver transaminase levels: Secondary | ICD-10-CM | POA: Insufficient documentation

## 2024-02-29 DIAGNOSIS — N189 Chronic kidney disease, unspecified: Secondary | ICD-10-CM | POA: Insufficient documentation

## 2024-02-29 DIAGNOSIS — E1122 Type 2 diabetes mellitus with diabetic chronic kidney disease: Secondary | ICD-10-CM | POA: Diagnosis not present

## 2024-02-29 DIAGNOSIS — G9341 Metabolic encephalopathy: Secondary | ICD-10-CM | POA: Diagnosis not present

## 2024-02-29 DIAGNOSIS — N179 Acute kidney failure, unspecified: Secondary | ICD-10-CM | POA: Diagnosis not present

## 2024-02-29 DIAGNOSIS — Z79899 Other long term (current) drug therapy: Secondary | ICD-10-CM | POA: Diagnosis not present

## 2024-02-29 DIAGNOSIS — E1140 Type 2 diabetes mellitus with diabetic neuropathy, unspecified: Secondary | ICD-10-CM | POA: Insufficient documentation

## 2024-02-29 DIAGNOSIS — Z9884 Bariatric surgery status: Secondary | ICD-10-CM | POA: Diagnosis not present

## 2024-02-29 DIAGNOSIS — R4182 Altered mental status, unspecified: Principal | ICD-10-CM

## 2024-02-29 DIAGNOSIS — I672 Cerebral atherosclerosis: Secondary | ICD-10-CM | POA: Diagnosis not present

## 2024-02-29 DIAGNOSIS — I130 Hypertensive heart and chronic kidney disease with heart failure and stage 1 through stage 4 chronic kidney disease, or unspecified chronic kidney disease: Secondary | ICD-10-CM | POA: Diagnosis not present

## 2024-02-29 DIAGNOSIS — R0902 Hypoxemia: Secondary | ICD-10-CM | POA: Diagnosis not present

## 2024-02-29 DIAGNOSIS — Z9889 Other specified postprocedural states: Secondary | ICD-10-CM | POA: Diagnosis not present

## 2024-02-29 DIAGNOSIS — I959 Hypotension, unspecified: Secondary | ICD-10-CM | POA: Diagnosis not present

## 2024-02-29 DIAGNOSIS — N281 Cyst of kidney, acquired: Secondary | ICD-10-CM | POA: Diagnosis not present

## 2024-02-29 DIAGNOSIS — F32A Depression, unspecified: Secondary | ICD-10-CM | POA: Diagnosis not present

## 2024-02-29 DIAGNOSIS — G8929 Other chronic pain: Secondary | ICD-10-CM | POA: Diagnosis not present

## 2024-02-29 DIAGNOSIS — R531 Weakness: Secondary | ICD-10-CM | POA: Diagnosis not present

## 2024-02-29 DIAGNOSIS — R410 Disorientation, unspecified: Secondary | ICD-10-CM | POA: Diagnosis not present

## 2024-02-29 DIAGNOSIS — E785 Hyperlipidemia, unspecified: Secondary | ICD-10-CM | POA: Insufficient documentation

## 2024-02-29 DIAGNOSIS — I5032 Chronic diastolic (congestive) heart failure: Secondary | ICD-10-CM | POA: Diagnosis not present

## 2024-02-29 DIAGNOSIS — M545 Low back pain, unspecified: Secondary | ICD-10-CM | POA: Insufficient documentation

## 2024-02-29 DIAGNOSIS — R270 Ataxia, unspecified: Secondary | ICD-10-CM | POA: Insufficient documentation

## 2024-02-29 DIAGNOSIS — Z96652 Presence of left artificial knee joint: Secondary | ICD-10-CM | POA: Insufficient documentation

## 2024-02-29 LAB — URINALYSIS, ROUTINE W REFLEX MICROSCOPIC
Bilirubin Urine: NEGATIVE
Glucose, UA: NEGATIVE mg/dL
Hgb urine dipstick: NEGATIVE
Ketones, ur: NEGATIVE mg/dL
Leukocytes,Ua: NEGATIVE
Nitrite: NEGATIVE
Protein, ur: 30 mg/dL — AB
Specific Gravity, Urine: 1.027 (ref 1.005–1.030)
pH: 5 (ref 5.0–8.0)

## 2024-02-29 LAB — CBC
HCT: 35.2 % — ABNORMAL LOW (ref 39.0–52.0)
Hemoglobin: 11 g/dL — ABNORMAL LOW (ref 13.0–17.0)
MCH: 33.7 pg (ref 26.0–34.0)
MCHC: 31.3 g/dL (ref 30.0–36.0)
MCV: 108 fL — ABNORMAL HIGH (ref 80.0–100.0)
Platelets: 197 K/uL (ref 150–400)
RBC: 3.26 MIL/uL — ABNORMAL LOW (ref 4.22–5.81)
RDW: 15.6 % — ABNORMAL HIGH (ref 11.5–15.5)
WBC: 11.9 K/uL — ABNORMAL HIGH (ref 4.0–10.5)
nRBC: 0 % (ref 0.0–0.2)

## 2024-02-29 LAB — COMPREHENSIVE METABOLIC PANEL WITH GFR
ALT: 95 U/L — ABNORMAL HIGH (ref 0–44)
AST: 41 U/L (ref 15–41)
Albumin: 2.8 g/dL — ABNORMAL LOW (ref 3.5–5.0)
Alkaline Phosphatase: 60 U/L (ref 38–126)
Anion gap: 13 (ref 5–15)
BUN: 52 mg/dL — ABNORMAL HIGH (ref 8–23)
CO2: 20 mmol/L — ABNORMAL LOW (ref 22–32)
Calcium: 7.8 mg/dL — ABNORMAL LOW (ref 8.9–10.3)
Chloride: 104 mmol/L (ref 98–111)
Creatinine, Ser: 2.96 mg/dL — ABNORMAL HIGH (ref 0.61–1.24)
GFR, Estimated: 22 mL/min — ABNORMAL LOW (ref >60)
Glucose, Bld: 98 mg/dL (ref 70–99)
Potassium: 4.7 mmol/L (ref 3.5–5.1)
Sodium: 137 mmol/L (ref 135–145)
Total Bilirubin: 0.5 mg/dL (ref 0.0–1.2)
Total Protein: 6.5 g/dL (ref 6.5–8.1)

## 2024-02-29 LAB — CK: Total CK: 94 U/L (ref 49–397)

## 2024-02-29 LAB — CBG MONITORING, ED: Glucose-Capillary: 70 mg/dL (ref 70–99)

## 2024-02-29 LAB — ACETAMINOPHEN LEVEL: Acetaminophen (Tylenol), Serum: <10 ug/mL — ABNORMAL LOW (ref 10–30)

## 2024-02-29 MED ORDER — SODIUM CHLORIDE 0.9 % IV SOLN: Freq: Once | INTRAVENOUS | Status: AC

## 2024-02-29 MED ORDER — EZETIMIBE 10 MG PO TABS
10.0000 mg | ORAL_TABLET | Freq: Every day | ORAL | Status: DC
  Administered 2024-02-29 – 2024-03-02 (×3): 10 mg via ORAL
  Filled 2024-02-29 (×3): qty 1

## 2024-02-29 MED ORDER — INSULIN ASPART 100 UNIT/ML IJ SOLN
0.0000 [IU] | Freq: Three times a day (TID) | INTRAMUSCULAR | Status: DC
  Administered 2024-03-01 (×2): 1 [IU] via SUBCUTANEOUS
  Filled 2024-02-29 (×2): qty 1

## 2024-02-29 MED ORDER — ACETAMINOPHEN 650 MG RE SUPP: 650.0000 mg | Freq: Four times a day (QID) | RECTAL | Status: DC | PRN

## 2024-02-29 MED ORDER — ROSUVASTATIN CALCIUM 20 MG PO TABS
40.0000 mg | ORAL_TABLET | Freq: Every day | ORAL | Status: DC
  Administered 2024-02-29 – 2024-03-01 (×2): 40 mg via ORAL
  Filled 2024-02-29 (×3): qty 2

## 2024-02-29 MED ORDER — PANTOPRAZOLE SODIUM 40 MG PO TBEC
40.0000 mg | DELAYED_RELEASE_TABLET | Freq: Every day | ORAL | Status: DC
  Administered 2024-02-29 – 2024-03-02 (×3): 40 mg via ORAL
  Filled 2024-02-29 (×3): qty 1

## 2024-02-29 MED ORDER — FLUTICASONE PROPIONATE 50 MCG/ACT NA SUSP
2.0000 | Freq: Every day | NASAL | Status: DC
  Administered 2024-02-29 – 2024-03-02 (×3): 2 via NASAL
  Filled 2024-02-29 (×2): qty 16

## 2024-02-29 MED ORDER — SUCRALFATE 1 G PO TABS
1.0000 g | ORAL_TABLET | Freq: Three times a day (TID) | ORAL | Status: DC
  Administered 2024-02-29 – 2024-03-02 (×7): 1 g via ORAL
  Filled 2024-02-29 (×7): qty 1

## 2024-02-29 MED ORDER — FINASTERIDE 5 MG PO TABS
5.0000 mg | ORAL_TABLET | Freq: Every day | ORAL | Status: DC
  Administered 2024-02-29 – 2024-03-02 (×3): 5 mg via ORAL
  Filled 2024-02-29 (×3): qty 1

## 2024-02-29 MED ORDER — GABAPENTIN 100 MG PO CAPS
100.0000 mg | ORAL_CAPSULE | Freq: Two times a day (BID) | ORAL | Status: DC
  Administered 2024-02-29 – 2024-03-02 (×5): 100 mg via ORAL
  Filled 2024-02-29 (×5): qty 1

## 2024-02-29 MED ORDER — HYDROMORPHONE HCL 1 MG/ML IJ SOLN
0.5000 mg | Freq: Four times a day (QID) | INTRAMUSCULAR | Status: AC | PRN
  Administered 2024-02-29 – 2024-03-01 (×4): 0.5 mg via INTRAVENOUS
  Filled 2024-02-29 (×4): qty 0.5

## 2024-02-29 MED ORDER — HEPARIN SODIUM (PORCINE) 5000 UNIT/ML IJ SOLN
5000.0000 [IU] | Freq: Two times a day (BID) | INTRAMUSCULAR | Status: DC
  Administered 2024-02-29 – 2024-03-02 (×5): 5000 [IU] via SUBCUTANEOUS
  Filled 2024-02-29 (×5): qty 1

## 2024-02-29 MED ORDER — ONDANSETRON HCL 4 MG PO TABS: 4.0000 mg | ORAL_TABLET | Freq: Four times a day (QID) | ORAL | Status: DC | PRN

## 2024-02-29 MED ORDER — SODIUM CHLORIDE 0.9 % IV SOLN: INTRAVENOUS | Status: AC

## 2024-02-29 MED ORDER — VENLAFAXINE HCL ER 75 MG PO CP24
75.0000 mg | ORAL_CAPSULE | Freq: Every day | ORAL | Status: DC
  Administered 2024-03-01 – 2024-03-02 (×2): 75 mg via ORAL
  Filled 2024-02-29 (×2): qty 1

## 2024-02-29 MED ORDER — HYDRALAZINE HCL 20 MG/ML IJ SOLN: 5.0000 mg | Freq: Four times a day (QID) | INTRAMUSCULAR | Status: DC | PRN

## 2024-02-29 MED ORDER — PRIMIDONE 50 MG PO TABS
50.0000 mg | ORAL_TABLET | Freq: Every day | ORAL | Status: DC
  Administered 2024-02-29 – 2024-03-01 (×2): 50 mg via ORAL
  Filled 2024-02-29 (×3): qty 1

## 2024-02-29 MED ORDER — ONDANSETRON HCL 4 MG/2ML IJ SOLN: 4.0000 mg | Freq: Four times a day (QID) | INTRAMUSCULAR | Status: DC | PRN

## 2024-02-29 MED ORDER — ACETAMINOPHEN 325 MG PO TABS
650.0000 mg | ORAL_TABLET | Freq: Four times a day (QID) | ORAL | Status: DC | PRN
Start: 2024-02-29 — End: 2024-03-02
  Administered 2024-02-29 – 2024-03-02 (×2): 650 mg via ORAL
  Filled 2024-02-29 (×2): qty 2

## 2024-02-29 NOTE — ED Provider Notes (Signed)
 [facility] Provider Note    Event Date/Time   First MD Initiated Contact with Patient 02/29/24 1046     (approximate)   History   Altered Mental Status and Tremors   HPI  ODIES DESA is a 73 y.o. male with a history of diabetes, acute renal failure, CHF, diabetes, history of gastric bypass, history of seizures who presents with altered mental status.  Per spouse patient was difficult to arouse this morning, she suspects related to medication.  No reports of fevers dysuria, cough, shortness of breath, rash     Physical Exam   Triage Vital Signs: ED Triage Vitals  Encounter Vitals Group     BP 02/29/24 0915 101/62     Girls Systolic BP Percentile --      Girls Diastolic BP Percentile --      Boys Systolic BP Percentile --      Boys Diastolic BP Percentile --      Pulse Rate 02/29/24 0915 92     Resp 02/29/24 0915 16     Temp 02/29/24 0915 97.8 F (36.6 C)     Temp Source 02/29/24 0915 Oral     SpO2 02/29/24 0915 97 %     Weight 02/29/24 0916 73.5 kg (162 lb)     Height 02/29/24 0916 1.676 m (5' 6)     Head Circumference --      Peak Flow --      Pain Score 02/29/24 0916 8     Pain Loc --      Pain Education --      Exclude from Growth Chart --     Most recent vital signs: Vitals:   02/29/24 0915  BP: 101/62  Pulse: 92  Resp: 16  Temp: 97.8 F (36.6 C)  SpO2: 97%     General: Awake, no distress.  Is able to answer questions but does have some confusion CV:  Good peripheral perfusion.  Resp:  Normal effort.  Abd:  No distention.  Soft, nontender Other:  Moves all extremities equally, cranial nerves II through XII appear normal.   ED Results / Procedures / Treatments   Labs (all labs ordered are listed, but only abnormal results are displayed) Labs Reviewed  COMPREHENSIVE METABOLIC PANEL WITH GFR - Abnormal; Notable for the following components:      Result Value   CO2 20 (*)    BUN 52 (*)    Creatinine, Ser 2.96 (*)     Calcium  7.8 (*)    Albumin  2.8 (*)    ALT 95 (*)    GFR, Estimated 22 (*)    All other components within normal limits  CBC - Abnormal; Notable for the following components:   WBC 11.9 (*)    RBC 3.26 (*)    Hemoglobin 11.0 (*)    HCT 35.2 (*)    MCV 108.0 (*)    RDW 15.6 (*)    All other components within normal limits  URINALYSIS, ROUTINE W REFLEX MICROSCOPIC - Abnormal; Notable for the following components:   Color, Urine YELLOW (*)    APPearance CLEAR (*)    Protein, ur 30 (*)    Bacteria, UA RARE (*)    All other components within normal limits     EKG  ED ECG REPORT I, Lamar Price, the attending physician, personally viewed and interpreted this ECG.  Date: 02/29/2024  Rhythm: normal sinus rhythm QRS Axis: normal Intervals: normal ST/T Wa nonspecific changes ve  abnormalities: normal Narrative Interpretation: no evidence of acute ischemia    RADIOLOGY CT head without acute abnormality, chronic paranasal sinus disease    PROCEDURES:  Critical Care performed:   Procedures   MEDICATIONS ORDERED IN ED: Medications  0.9 %  sodium chloride  infusion ( Intravenous New Bag/Given 02/29/24 1114)     IMPRESSION / MDM / ASSESSMENT AND PLAN / ED COURSE  I reviewed the triage vital signs and the nursing notes. Patient's presentation is most consistent with acute presentation with potential threat to life or bodily function.  Patient presents with altered mental status as detailed above, he is slightly confused but moving all extremities normally, no cranial nerve abnormalities, doubt CVA, will send for CT head, obtain labs and monitor carefully  CT scan without acute abnormality  Lab work is notable for an acute kidney injury with a creatinine near 3, his baseline appears to be 1.1  IV fluids are infusing, urinalysis without acute abnormality, will consult the hospitalist team        FINAL CLINICAL IMPRESSION(S) / ED DIAGNOSES   Final diagnoses:   Altered mental status, unspecified altered mental status type  Dehydration  AKI (acute kidney injury) (HCC)     Rx / DC Orders   ED Discharge Orders     None        Note:  This document was prepared using Dragon voice recognition software and may include unintentional dictation errors.   Arlander Charleston, MD 02/29/24 1218

## 2024-02-29 NOTE — ED Notes (Signed)
 Pharmacy and US  tech in room.

## 2024-02-29 NOTE — ED Notes (Signed)
Lab called to obtain lab work .

## 2024-02-29 NOTE — ED Notes (Signed)
 Repositioned patient in bed with help from other staff.

## 2024-02-29 NOTE — Evaluation (Signed)
 Physical Therapy Evaluation Patient Details Name: Justin Patel MRN: [id number] DOB: Oct 11, 1950 Today's Date: 02/29/2024  History of Present Illness  73 y.o. male with medical history significant of HTN/chronic HFpEF, morbid obesity status post gastric bypass in 2010, IIDM, orthostatic hypotension, HLD, anxiety/depression, chronic back pain and degenerative lumbar problem, GERD, sent by family member for evaluation of altered mentations.  Clinical Impression  Pt was limited by consistent myoclonic jerks and general confusion t/o the session.  We were able to get up to sitting and even standing with some assist, but pt had 1 significant (and multiple smaller) buckling/jerk episodes in just ~20 seconds of standing that further standing/gait activities were unsafe.  Pt is far from his baseline and will require futher PT to address functional limitations.        If plan is discharge home, recommend the following: A lot of help with walking and/or transfers;A lot of help with bathing/dressing/bathroom;Direct supervision/assist for medications management;Direct supervision/assist for financial management;Assist for transportation;Help with stairs or ramp for entrance;Supervision due to cognitive status   Can travel by private vehicle   No    Equipment Recommendations  (TBD)  Recommendations for Other Services       Functional Status Assessment Patient has had a recent decline in their functional status and demonstrates the ability to make significant improvements in function in a reasonable and predictable amount of time.     Precautions / Restrictions Precautions Precautions: Fall Restrictions Weight Bearing Restrictions Per Provider Order: No      Mobility  Bed Mobility Overal bed mobility: Needs Assistance Bed Mobility: Supine to Sit     Supine to sit: Min assist     General bed mobility comments: Pt needed light assist to come to and maintain fully upright sitting     Transfers Overall transfer level: Needs assistance Equipment used: Rolling walker (2 wheels) Transfers: Sit to/from Stand Sit to Stand: Min assist, Mod assist           General transfer comment: Pt eager to try standing but again with consistent jerking needing assist to get to and safely maintain standing.  Had significant b/l knee jerk/buckling needing direct assist to arrest.    Ambulation/Gait               General Gait Details: deferred ambulation away from the bed due to coordination/control/jerks that were a clear safety concern at this time  Stairs            Wheelchair Mobility     Tilt Bed    Modified Rankin (Stroke Patients Only)       Balance Overall balance assessment: Needs assistance Sitting-balance support: Bilateral upper extremity supported Sitting balance-Leahy Scale: Fair Sitting balance - Comments: able to maintain static sitting despite myoclonic jerks   Standing balance support: Bilateral upper extremity supported Standing balance-Leahy Scale: Poor Standing balance comment: myoclonic jerks and knee buckling in standing, UEs on walker and direct PT assist to maintain balance                             Pertinent Vitals/Pain Pain Assessment Pain Assessment: No/denies pain    Home Living Family/patient expects to be discharged to:: Private residence Living Arrangements: Spouse/significant other Available Help at Discharge: Family;Available PRN/intermittently Type of Home: House Home Access: Stairs to enter Entrance Stairs-Rails: Doctor, general practice of Steps: 3   Home Layout: One level Home Equipment: Cane - single point;Wheelchair - manual;Rolling  Walker (2 wheels);Grab bars - tub/shower;Grab bars - toilet;Shower seat Additional Comments: Info gathered from prior documentation, pt did report mostly corroborating info    Prior Function Prior Level of Function : Independent/Modified Independent              Mobility Comments: uses cane in community only ADLs Comments: reports he does not need help     Extremity/Trunk Assessment   Upper Extremity Assessment Upper Extremity Assessment: Generalized weakness (myoclonic jerks)    Lower Extremity Assessment Lower Extremity Assessment: Generalized weakness (myoclonic jerks, lacks full ankle DF, poor overall control)       Communication   Communication Communication: Impaired Factors Affecting Communication: Difficulty expressing self    Cognition Arousal: Alert Behavior During Therapy:  (constant myoclonic jerks, difficulty getting out full thoughts)   PT - Cognitive impairments: No family/caregiver present to determine baseline                       PT - Cognition Comments: Pt struggled to follow questioning, guess the year but even with assist could not get Month/day, knew he was in the hospital but not location, regularly off topic and stammering Following commands: Intact       Cueing Cueing Techniques: Verbal cues, Gestural cues     General Comments      Exercises     Assessment/Plan    PT Assessment Patient needs continued PT services  PT Problem List Decreased strength;Decreased range of motion;Decreased activity tolerance;Decreased balance;Decreased cognition;Decreased knowledge of use of DME;Decreased safety awareness       PT Treatment Interventions DME instruction;Gait training;Stair training;Functional mobility training;Therapeutic activities;Therapeutic exercise;Balance training;Patient/family education    PT Goals (Current goals can be found in the Care Plan section)  Acute Rehab PT Goals Patient Stated Goal: go home PT Goal Formulation: With patient Time For Goal Achievement: 03/13/24 Potential to Achieve Goals: Fair    Frequency Min 2X/week     Co-evaluation               AM-PAC PT 6 Clicks Mobility  Outcome Measure Help needed turning from your back to your side while  in a flat bed without using bedrails?: A Little Help needed moving from lying on your back to sitting on the side of a flat bed without using bedrails?: A Little Help needed moving to and from a bed to a chair (including a wheelchair)?: A Lot Help needed standing up from a chair using your arms (e.g., wheelchair or bedside chair)?: A Lot Help needed to walk in hospital room?: Total Help needed climbing 3-5 steps with a railing? : Total 6 Click Score: 12    End of Session Equipment Utilized During Treatment: Gait belt Activity Tolerance: Patient tolerated treatment well Patient left: with bed alarm set;with call bell/phone within reach   PT Visit Diagnosis: Muscle weakness (generalized) (M62.81);Difficulty in walking, not elsewhere classified (R26.2);Unsteadiness on feet (R26.81);Ataxic gait (R26.0)    Time: 8391-8364 PT Time Calculation (min) (ACUTE ONLY): 27 min   Charges:   PT Evaluation $PT Eval Low Complexity: 1 Low PT Treatments $Therapeutic Activity: 8-22 mins PT General Charges $$ ACUTE PT VISIT: 1 Visit         Carmin JONELLE Deed, DPT 02/29/2024, 5:56 PM

## 2024-02-29 NOTE — H&P (Signed)
 History and Physical    Justin Patel FMW:[id number] DOB: 01/27/1951 DOA: 02/29/2024  PCP: Abbey Bruckner, MD (Confirm with patient/family/NH records and if not entered, this has to be entered at [facility] point of entry) Patient coming from: Home  I have personally briefly reviewed patient's old medical records in [facility]  Chief Complaint: AMS  HPI: Justin Patel is a 73 y.o. male with medical history significant of HTN/chronic HFpEF, morbid obesity status post gastric bypass in 2010, IIDM, orthostatic hypotension, HLD, anxiety/depression, chronic back pain and degenerative lumbar problem, GERD, sent by family member for evaluation of altered mentations.  Patient is confused and unable to provide any history, most of history provided by wife over the phone.  Wife reported that the patient has a chronic back pain for which she has been taking chronic Tylenol , however recently last week back pain became uncontrolled and patient was prescribed with hydrocodone  and he started to take hydrocodone  since this Monday.  Tuesday, patient started became very sleepy and confused, and wife measured his BP Tuesday morning, reading was 70/40, after stay up for a brief period time patient went back to sleep from 4 PM to 7 AM today.  And wife also found patient was shaky and unsteady on his feet.  But denied any nausea vomiting or diarrhea for last 2 days.  Patient denied any active pain currently, denies any dysuria or urinary frequency no nausea vomiting or diarrhea. ED Course: Afebrile, pulse 85, blood pressure 100/60 O2 saturation 97% on room air.  CT head showed chronic white matter changes and atrophy otherwise no acute findings, blood work showed BUN 53 creatinine 2.0 compared to baseline 1.0 glucose 98 bicarb 20 hemoglobin 11 WBC 11.9.  Patient was given IV bolus in the ED.  Review of Systems: As per HPI otherwise 14 point review of systems negative.    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  removal scheduled to clear margins.   CHF (congestive heart failure) (HCC)    no longer after weight loss   Chronic abdominal pain 02/27/2019   Chronic kidney disease    cysts   Degenerative disc disease    Depression    Diabetes mellitus    no longer diabetic-or on meds   Diarrhea 07/22/2023   DJD (degenerative joint disease)    Dysplastic nevus 02/04/2016   R upper back paraspinal - severe   Dyspnea on exertion 08/24/2018   Family history of adverse reaction to anesthesia    sons wake up combative   Foot fracture 02/02/2022   Foot fracture, left 01/2022   Fracture of L1 vertebra (HCC) 01/14/2020   Gastric ulcer    GERD (gastroesophageal reflux disease)    Grief 03/15/2023   H/O hiatal hernia    Headache(784.0)    sinus   Heart murmur    History of fusion of thoracic spine 12/28/2021   History of gastric bypass    Hx of congestive heart failure    Hyperlipidemia    Hypertension    Intertrigo 02/24/2017   Iron  deficiency anemia 02/28/2015   Itchy skin 12/04/2019   Lumbar scoliosis    Numbness of face 05/22/2019  Opiate abuse, continuous (HCC) 06/20/2015   Orthostatic hypotension 09/08/2023   Otitis media 09/07/2023   Pneumonia 11/20/2022   Rash 03/11/2017   RLQ abdominal pain 05/18/2018   S/P laparoscopic cholecystectomy 02/18/2020   Sacral fracture, closed (HCC)    Seizures (HCC)    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, after GI bleed   Sleep apnea    hx not now since wt loss   Sore throat 03/15/2023   Stones in the urinary tract    Syncope and collapse    Transaminitis 09/07/2023   Tylenol  overdose, accidental or unintentional, initial encounter 09/07/2023   Vertigo 09/27/2023   Vitamin D   deficiency 07/20/2017    Past Surgical History:  Procedure Laterality Date   ANTERIOR CERVICAL DECOMP/DISCECTOMY FUSION  01/26/2012   Procedure: ANTERIOR CERVICAL DECOMPRESSION/DISCECTOMY FUSION 3 LEVELS;  Surgeon: Catalina CHRISTELLA Stains, MD;  Location: [facility];  Service: Neurosurgery;  Laterality: N/A;  Cervical three-four Cervical four-five Cervical five-six Cervical six-seven , Anterior cervical decompression/diskectomy, fusion, plate   ANTERIOR LAT LUMBAR FUSION Right 04/25/2018   Procedure: Right Lumbar two-three Lumbar three-four Lumbar four-five anterior  lateral interbody fusion  with posterior percutaneous pedicle screws;  Surgeon: Unice Pac, MD;  Location: [facility] OR;  Service: Neurosurgery;  Laterality: Right;   APPENDECTOMY     CARDIAC CATHETERIZATION     [facility], normal   CARDIAC CATHETERIZATION     [place]   CHOLECYSTECTOMY     COLONOSCOPY WITH PROPOFOL  N/A 12/27/2017   Procedure: COLONOSCOPY WITH PROPOFOL ;  Surgeon: Toledo, Ladell POUR, MD;  Location: [facility];  Service: Gastroenterology;  Laterality: N/A;   ESOPHAGOGASTRODUODENOSCOPY (EGD) WITH PROPOFOL  N/A 12/27/2017   Procedure: ESOPHAGOGASTRODUODENOSCOPY (EGD) WITH PROPOFOL ;  Surgeon: Toledo, Ladell POUR, MD;  Location: [facility];  Service: Gastroenterology;  Laterality: N/A;   ESOPHAGOGASTRODUODENOSCOPY (EGD) WITH PROPOFOL  N/A 06/19/2021   Procedure: ESOPHAGOGASTRODUODENOSCOPY (EGD) WITH PROPOFOL ;  Surgeon: Maryruth Ole DASEN, MD;  Location: [facility];  Service: Gastroenterology;  Laterality: N/A;   ESOPHAGOGASTRODUODENOSCOPY (EGD) WITH PROPOFOL  N/A 09/25/2021   Procedure: ESOPHAGOGASTRODUODENOSCOPY (EGD) WITH PROPOFOL ;  Surgeon: Maryruth Ole DASEN, MD;  Location: [facility];  Service: Endoscopy;  Laterality: N/A;   GASTRIC BYPASS  08/09/2008   [employer]   HARDWARE REMOVAL N/A 03/08/2022   Procedure: Removal of bilateral Thoracic ten pedicle screws;  Surgeon: Louis Shove, MD;  Location:  [facility] OR;  Service: Neurosurgery;  Laterality: N/A;   HERNIA REPAIR  08/09/1998   hiatal   JOINT REPLACEMENT     KNEE ARTHROSCOPY     bilateral, left x 2   LAMINECTOMY WITH POSTERIOR LATERAL ARTHRODESIS LEVEL 4 N/A 01/14/2020   Procedure: Decompressive Laminectomy Lumbar One with pedicle screw fixation from Thoracic Ten to Lumbar Two;  Surgeon: Unice Pac, MD;  Location: [facility] OR;  Service: Neurosurgery;  Laterality: N/A;  Decompressive Laminectomy Lumbar One with pedicle screw fixation from Thoracic Ten to Lumbar Two   LUMBAR PERCUTANEOUS PEDICLE SCREW 3 LEVEL N/A 04/25/2018   Procedure: LUMBAR PERCUTANEOUS PEDICLE SCREW 3 LEVEL;  Surgeon: Unice Pac, MD;  Location: [facility] OR;  Service: Neurosurgery;  Laterality: N/A;   NASAL SINUS SURGERY     x5   OSTEOTOMY  08/10/1999   left   ROUX-EN-Y GASTRIC BYPASS     THORACIC LAMINECTOMY FOR EPIDURAL ABSCESS N/A 03/28/2022   Procedure: THORACIC WOUND WASHOUT;  Surgeon: Joshua Alm RAMAN, MD;  Location: [facility] OR;  Service: Neurosurgery;  Laterality: N/A;   TONSILLECTOMY     TOTAL KNEE ARTHROPLASTY Left 05/09/2014  Dr. Mardee SORENSON  08/09/2009     reports that he has never smoked. He has never used smokeless tobacco. He reports current alcohol use of about 21.0 standard drinks of alcohol per week. He reports that he does not use drugs.  Allergies  Allergen Reactions   Altace [Ramipril] Anaphylaxis   Ace Inhibitors Hives   Levaquin [Levofloxacin In D5w] Hives   Lyrica [Pregabalin] Other (See Comments)    Edema   Metformin Diarrhea    High doses cause diarrhea.    Trulicity  [Dulaglutide ] Nausea And Vomiting    Family History  Problem Relation Age of Onset   Dementia Mother 1   Osteoporosis Mother    Heart disease Father 78   Diabetes Father    Lymphoma Sister        lymphoma, stage 4   Ovarian cancer Sister 67       Ovarian   Lupus Sister    Prostate cancer Maternal Uncle    Skin cancer Maternal Uncle    Tongue cancer  Maternal Uncle        uncle died of heart attack   Alcoholism Maternal Uncle    Lung cancer Paternal Aunt        pat aunts x 4 died of lung cancer   Lung cancer Paternal Aunt    Lung cancer Paternal Aunt    Lung cancer Paternal Aunt    Mesothelioma Cousin        paternal cousin     Prior to Admission medications   Medication Sig Start Date End Date Taking? Authorizing Provider  amLODipine  (NORVASC ) 5 MG tablet Take 1 tablet (5 mg total) by mouth daily. Patient taking differently: Take 5 mg by mouth in the morning. 09/13/23   Patsy Lenis, MD  Calcium  Carbonate-Vitamin D  (CALCIUM  600/VITAMIN D  PO) Take 1 tablet by mouth daily after supper.    [provider]  Cholecalciferol  50 MCG (2000 UT) TABS Take 2,000 Units by mouth daily after supper. 01/31/20   [provider]  esomeprazole  (NEXIUM ) 40 MG capsule Take 1 capsule (40 mg total) by mouth daily. 02/07/24   Bair, Kalpana, MD  ezetimibe  (ZETIA ) 10 MG tablet Take 1 tablet (10 mg total) by mouth daily. 02/07/24   Bair, Kalpana, MD  finasteride  (PROSCAR ) 5 MG tablet Take 1 tablet (5 mg total) by mouth daily. 02/07/24   Bair, Kalpana, MD  fluticasone  (FLONASE ) 50 MCG/ACT nasal spray Place 2 sprays into both nostrils daily. 09/27/23   Maribeth Camellia MATSU, MD  gabapentin  (NEURONTIN ) 300 MG capsule Take 1 capsule (300 mg total) by mouth 2 (two) times daily. 02/07/24   Bair, Kalpana, MD  linagliptin  (TRADJENTA ) 5 MG TABS tablet Take 1 tablet (5 mg total) by mouth daily. 11/08/23   Bair, Kalpana, MD  metroNIDAZOLE  (METROGEL ) 0.75 % gel Apply 1 Application topically at bedtime. qhs to face for Rosacea 10/05/23 10/04/24  Hester Lenis BROCKS, MD  Multiple Vitamin (MULTIVITAMIN) capsule Take 1 capsule by mouth daily. With copper  and zinc  07/11/18   Maribeth Camellia MATSU, MD  oxyCODONE -acetaminophen  (PERCOCET) 7.5-325 MG tablet Take 1 tablet by mouth 4 (four) times daily as needed. 09/14/23   Patsy Lenis, MD  primidone  (MYSOLINE ) 50 MG tablet Take 1  tablet (50 mg total) by mouth at bedtime. 10/25/23   Skeet Juliene SAUNDERS, DO  rosuvastatin  (CRESTOR ) 40 MG tablet Take 1 tablet (40 mg total) by mouth at bedtime. 02/07/24   Bair, Kalpana, MD  Semaglutide  (RYBELSUS ) 3 MG TABS  Take 1 tablet (3 mg total) by mouth daily. 02/09/24   Bair, Kalpana, MD  Semaglutide  (RYBELSUS ) 7 MG TABS Take 1 tablet (7 mg total) by mouth daily. 02/09/24   Bair, Kalpana, MD  sucralfate  (CARAFATE ) 1 g tablet Take 1 tablet (1 g total) by mouth 4 (four) times daily. 02/07/24   Bair, Kalpana, MD  valsartan  (DIOVAN ) 80 MG tablet Take 1 tablet (80 mg total) by mouth at bedtime. 09/13/23   Patsy Lenis, MD  venlafaxine  XR (EFFEXOR -XR) 75 MG 24 hr capsule Take 1 capsule (75 mg total) by mouth daily. 02/07/24   Abbey Bruckner, MD    Physical Exam: Vitals:   02/29/24 0915 02/29/24 0916 02/29/24 1200 02/29/24 1215  BP: 101/62  122/61   Pulse: 92  85   Resp: 16  (!) 23 11  Temp: 97.8 F (36.6 C)     TempSrc: Oral     SpO2: 97%  97%   Weight:  73.5 kg    Height:  5' 6 (1.676 m)      Constitutional: NAD, calm, comfortable Vitals:   02/29/24 0915 02/29/24 0916 02/29/24 1200 02/29/24 1215  BP: 101/62  122/61   Pulse: 92  85   Resp: 16  (!) 23 11  Temp: 97.8 F (36.6 C)     TempSrc: Oral     SpO2: 97%  97%   Weight:  73.5 kg    Height:  5' 6 (1.676 m)     Eyes: PERRL, lids and conjunctivae normal ENMT: Mucous membranes are moist. Posterior pharynx clear of any exudate or lesions.Normal dentition.  Neck: normal, supple, no masses, no thyromegaly Respiratory: clear to auscultation bilaterally, no wheezing, no crackles. Normal respiratory effort. No accessory muscle use.  Cardiovascular: Regular rate and rhythm, no murmurs / rubs / gallops. No extremity edema. 2+ pedal pulses. No carotid bruits.  Abdomen: no tenderness, no masses palpated. No hepatosplenomegaly. Bowel sounds positive.  Musculoskeletal: no clubbing / cyanosis. No joint deformity upper and lower extremities. Good ROM,  no contractures. Normal muscle tone.  Skin: no rashes, lesions, ulcers. No induration Neurologic: CN 2-12 grossly intact. Sensation intact, DTR normal. Strength 5/5 in all 4.  Positive for ataxia on both upper and lower extremities Psychiatric: Normal judgment and insight. Alert and oriented x 3. Normal mood.     Labs on Admission: I have personally reviewed following labs and imaging studies  CBC: Recent Labs  Lab 02/29/24 0950  WBC 11.9*  HGB 11.0*  HCT 35.2*  MCV 108.0*  PLT 197   Basic Metabolic Panel: Recent Labs  Lab 02/29/24 0950  NA 137  K 4.7  CL 104  CO2 20*  GLUCOSE 98  BUN 52*  CREATININE 2.96*  CALCIUM  7.8*   GFR: Estimated Creatinine Clearance: 20.1 mL/min (A) (by C-G formula based on SCr of 2.96 mg/dL (H)). Liver Function Tests: Recent Labs  Lab 02/29/24 0950  AST 41  ALT 95*  ALKPHOS 60  BILITOT 0.5  PROT 6.5  ALBUMIN  2.8*   No results for input(s): LIPASE, AMYLASE in the last 168 hours. No results for input(s): AMMONIA in the last 168 hours. Coagulation Profile: No results for input(s): INR, PROTIME in the last 168 hours. Cardiac Enzymes: No results for input(s): CKTOTAL, CKMB, CKMBINDEX, TROPONINI in the last 168 hours. BNP (last 3 results) No results for input(s): PROBNP in the last 8760 hours. HbA1C: No results for input(s): HGBA1C in the last 72 hours. CBG: No results for input(s): GLUCAP in the  last 168 hours. Lipid Profile: No results for input(s): CHOL, HDL, LDLCALC, TRIG, CHOLHDL, LDLDIRECT in the last 72 hours. Thyroid  Function Tests: No results for input(s): TSH, T4TOTAL, FREET4, T3FREE, THYROIDAB in the last 72 hours. Anemia Panel: No results for input(s): VITAMINB12, FOLATE, FERRITIN, TIBC, IRON , RETICCTPCT in the last 72 hours. Urine analysis:    Component Value Date/Time   COLORURINE YELLOW (A) 02/29/2024 0918   APPEARANCEUR CLEAR (A) 02/29/2024 0918    APPEARANCEUR Clear 05/01/2014 0928   LABSPEC 1.027 02/29/2024 0918   LABSPEC 1.031 05/01/2014 0928   PHURINE 5.0 02/29/2024 0918   GLUCOSEU NEGATIVE 02/29/2024 0918   GLUCOSEU Negative 05/01/2014 0928   HGBUR NEGATIVE 02/29/2024 0918   BILIRUBINUR NEGATIVE 02/29/2024 0918   BILIRUBINUR neg 01/11/2022 1147   BILIRUBINUR Negative 05/01/2014 0928   KETONESUR NEGATIVE 02/29/2024 0918   PROTEINUR 30 (A) 02/29/2024 0918   UROBILINOGEN 0.2 01/11/2022 1147   NITRITE NEGATIVE 02/29/2024 0918   LEUKOCYTESUR NEGATIVE 02/29/2024 0918   LEUKOCYTESUR Negative 05/01/2014 0928    Radiological Exams on Admission: CT Head Wo Contrast Result Date: 02/29/2024 CLINICAL DATA:  Mental status change, unknown cause EXAM: CT HEAD WITHOUT CONTRAST TECHNIQUE: Contiguous axial images were obtained from the base of the skull through the vertex without intravenous contrast. RADIATION DOSE REDUCTION: This exam was performed according to the departmental dose-optimization program which includes automated exposure control, adjustment of the mA and/or kV according to patient size and/or use of iterative reconstruction technique. COMPARISON:  CT of the head dated September 07, 2023. FINDINGS: Brain: There is age-related cerebral and cerebellar volume loss present. There is mild to moderate periventricular white matter disease. There is no evidence of hemorrhage, mass acute cortical infarct or hydrocephalus. Vascular: Mild calcific atheromatous disease within the carotid siphons and vertebral arteries. Skull: Intact and unremarkable. Sinuses/Orbits: Status post left sinonasal surgery. There is mucosal disease within the ethmoid air cells, left maxillary sinus and the sphenoid sinuses. Other: None. IMPRESSION: 1. Age-related atrophy and mild to moderate cerebral white matter disease. 2. 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 Electronically Signed   By: Evalene Coho M.D.   On: 02/29/2024 11:59    EKG: Independently reviewed.  Sinus rhythm, no acute ST changes.  Assessment/Plan Principal Problem:   Encephalopathy Active Problems:   Acute encephalopathy   Ataxia  (please populate well all problems here in Problem List. (For example, if patient is on BP meds at home and you resume or decide to hold them, it is a problem that needs to be her. Same for CAD, COPD, HLD and so on)  Acute ataxia Acute metabolic encephalopathy - Secondary to polypharmacy, especially interaction of narcotics/hydrocodone  and other CNS medication such as gabapentin  primidone , on top of AKI - Hold off narcotics - Decrease gabapentin  dosage to half, 100 mg twice daily - Resume primidone  tomorrow - PT evaluation  AKI - Probably multifactorial, most likely secondary to polypharmacy, probably dehydration as well as hypotension secondary to dehydration and BP medications - Continue IV hydration - Check renal ultrasound - UA negative for UTI - Hold off home BP meds -CK pending  IIDM -Hold off p.o. diabetic medications - SSI  DM neuropathy - Cut down gabapentin  dosage as above  BPH - Continue Proscar   DVT prophylaxis: Heparin  subcu Code Status: Full code Family Communication: Wife over the phone Disposition Plan: Expect less than 2 midnight hospital stay Consults called: None Admission status: Telemetry observation   Sharlie Shreffler T  Bashir Marchetti MD [facility] Pager [phone]  02/29/2024, 12:51 PM

## 2024-02-29 NOTE — ED Triage Notes (Signed)
 Per EMS, Pt, from home, presents w/ lethargy, tremors, and intermittent confusion starting this morning.  Pt's wife reported he was hard to wake up.  Also, she reported to EMS this has happened before when he took to much pain medication.    Pt has chronic back pain.  Pain score 8/10.   EMS reports small pupils and hypotension.

## 2024-03-01 ENCOUNTER — Telehealth: Payer: Self-pay | Admitting: *Deleted

## 2024-03-01 DIAGNOSIS — R270 Ataxia, unspecified: Secondary | ICD-10-CM

## 2024-03-01 DIAGNOSIS — G9340 Encephalopathy, unspecified: Secondary | ICD-10-CM

## 2024-03-01 LAB — CBC WITH DIFFERENTIAL/PLATELET
Abs Immature Granulocytes: 0.02 K/uL (ref 0.00–0.07)
Basophils Absolute: 0 K/uL (ref 0.0–0.1)
Basophils Relative: 1 %
Eosinophils Absolute: 0.2 K/uL (ref 0.0–0.5)
Eosinophils Relative: 3 %
HCT: 33.8 % — ABNORMAL LOW (ref 39.0–52.0)
Hemoglobin: 10.8 g/dL — ABNORMAL LOW (ref 13.0–17.0)
Immature Granulocytes: 0 %
Lymphocytes Relative: 14 %
Lymphs Abs: 1.1 K/uL (ref 0.7–4.0)
MCH: 33.9 pg (ref 26.0–34.0)
MCHC: 32 g/dL (ref 30.0–36.0)
MCV: 106 fL — ABNORMAL HIGH (ref 80.0–100.0)
Monocytes Absolute: 0.5 K/uL (ref 0.1–1.0)
Monocytes Relative: 7 %
Neutro Abs: 5.5 K/uL (ref 1.7–7.7)
Neutrophils Relative %: 75 %
Platelets: 181 K/uL (ref 150–400)
RBC: 3.19 MIL/uL — ABNORMAL LOW (ref 4.22–5.81)
RDW: 15.5 % (ref 11.5–15.5)
WBC: 7.4 K/uL (ref 4.0–10.5)
nRBC: 0 % (ref 0.0–0.2)

## 2024-03-01 LAB — GLUCOSE, CAPILLARY
Glucose-Capillary: 69 mg/dL — ABNORMAL LOW (ref 70–99)
Glucose-Capillary: 129 mg/dL — ABNORMAL HIGH (ref 70–99)
Glucose-Capillary: 123 mg/dL — ABNORMAL HIGH (ref 70–99)
Glucose-Capillary: 132 mg/dL — ABNORMAL HIGH (ref 70–99)

## 2024-03-01 LAB — COMPREHENSIVE METABOLIC PANEL WITH GFR
ALT: 62 U/L — ABNORMAL HIGH (ref 0–44)
AST: 23 U/L (ref 15–41)
Albumin: 2.5 g/dL — ABNORMAL LOW (ref 3.5–5.0)
Alkaline Phosphatase: 51 U/L (ref 38–126)
Anion gap: 5 (ref 5–15)
BUN: 30 mg/dL — ABNORMAL HIGH (ref 8–23)
CO2: 22 mmol/L (ref 22–32)
Calcium: 7.4 mg/dL — ABNORMAL LOW (ref 8.9–10.3)
Chloride: 114 mmol/L — ABNORMAL HIGH (ref 98–111)
Creatinine, Ser: 1.32 mg/dL — ABNORMAL HIGH (ref 0.61–1.24)
GFR, Estimated: 57 mL/min — ABNORMAL LOW (ref >60)
Glucose, Bld: 125 mg/dL — ABNORMAL HIGH (ref 70–99)
Potassium: 4.2 mmol/L (ref 3.5–5.1)
Sodium: 141 mmol/L (ref 135–145)
Total Bilirubin: 0.7 mg/dL (ref 0.0–1.2)
Total Protein: 5.5 g/dL — ABNORMAL LOW (ref 6.5–8.1)

## 2024-03-01 MED ORDER — LIDOCAINE 5 % EX PTCH
1.0000 | MEDICATED_PATCH | CUTANEOUS | Status: DC
  Administered 2024-03-01: 1 via TRANSDERMAL
  Filled 2024-03-01 (×2): qty 1

## 2024-03-01 NOTE — Hospital Course (Signed)
 Justin Patel is a 73 year old male with hypertension, chronic heart failure preserved EF, morbid obesity status post gastric bypass in 2010, diabetes, orthostatic hypotension, hyperlipidemia, anxiety, depression, chronic back pain, GERD, who presented with altered mental status. Patient's wife provides the majority of history at intake.  She reports that due to patient's chronic back pain he has been taking Tylenol  regularly but due to recent flare he was prescribed hydrocodone  and has been taking it daily for 4 days.  She reported the patient became increasingly somnolent and confused.  She also noted that the patient was increasingly hypotensive with a blood pressure of 70/40.  On arrival to the ED blood pressure was 100/60, pulse 85, head CT with chronic white matter changes and atrophy but no acute findings.  Patient was found to have an AKI with a creatinine of 2.0.

## 2024-03-01 NOTE — TOC Initial Note (Signed)
 Transition of Care [facility]) - Initial/Assessment Note    Patient Details  Name: Justin Patel MRN: [id number] Date of Birth: [birth year]  Transition of Care [facility]) CM/SW Contact:    Dalia GORMAN Fuse, RN Phone Number: 03/01/2024, 4:13 PM  Clinical Narrative:                 Patient is from home with his wife. He drives and is able to get to and from appts without any dificulty. His PCP is Dr. Abbey and he uses [facility]. Per the patient he successfully amb in the hall with and without a walker and would like to go home. He declined HH at this time.  No TOC needs, please outreach to [facility] if needs identified.  Expected Discharge Plan: Home w Home Health Services Barriers to Discharge: Continued Medical Work up   Patient Goals and CMS Choice     Choice offered to / list presented to : Patient      Expected Discharge Plan and Services                                              Prior Living Arrangements/Services                       Activities of Daily Living   ADL Screening (condition at time of admission) Independently performs ADLs?: Yes (appropriate for developmental age) Is the patient deaf or have difficulty hearing?: No Does the patient have difficulty seeing, even when wearing glasses/contacts?: No Does the patient have difficulty concentrating, remembering, or making decisions?: Yes  Permission Sought/Granted                  Emotional Assessment              Admission diagnosis:  Dehydration [E86.0] Encephalopathy [G93.40] AKI (acute kidney injury) (HCC) [N17.9] Altered mental status, unspecified altered mental status type [R41.82] Patient Active Problem List   Diagnosis Date Noted   Ataxia 02/29/2024   Encephalopathy 02/29/2024   Type II diabetes mellitus with nephropathy (HCC) 02/09/2024   Acute encephalopathy 11/08/2023   Right flank hematoma 07/02/2023   Allergic rhinitis 02/01/2023   Tremor 11/10/2022   Chronic  kidney disease due to type 2 diabetes mellitus (HCC) 08/29/2022   Hyperparathyroidism due to renal insufficiency (HCC) 08/29/2022   Proteinuria 08/29/2022   Stage 3a chronic kidney disease (HCC) 08/29/2022   Lumbar pseudoarthrosis 03/08/2022   Thoracic radiculopathy 02/01/2022   Iron  deficiency 01/19/2022   Lumbar radiculopathy 09/25/2020   Aortic atherosclerosis (HCC) 05/20/2020   Common bile duct dilatation 05/20/2020   S/P gastric bypass 01/30/2020   Arthralgia 08/17/2019   Mass of submandibular region 08/17/2019   Chronic night sweats 03/21/2019   Muscle weakness 08/24/2018   Macrocytosis without anemia 07/05/2018   Lumbar scoliosis 04/25/2018   Easy bruising 12/09/2017   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   Macrocytosis 11/02/2017   GERD (gastroesophageal reflux disease) 10/18/2017   Osteoporosis without current pathological fracture 07/20/2017   Osteopenia determined by x-ray 04/25/2017   Skin lesion 03/11/2017   Seborrheic dermatitis 02/24/2017   Mood disorder (HCC) 06/25/2016   Cervical facet syndrome 03/15/2016   Facet syndrome, lumbar 03/15/2016   DDD (degenerative disc disease), cervical 03/15/2016   DDD (degenerative disc disease), lumbar 03/15/2016   Other iron  deficiency anemias 11/27/2015  Chronic pain syndrome 04/16/2015   Former consumption of alcohol 10/01/2014   BPH (benign prostatic hyperplasia) 11/16/2013   Anemia 11/08/2013   Renal cyst 08/27/2013   Encounter for diabetic foot exam (HCC) 03/21/2013   Essential hypertension, benign 03/21/2013   Hyperlipidemia 03/21/2013   Overweight (BMI 25.0-29.9) 03/21/2013   PCP:  Abbey Bruckner, MD Pharmacy:   [facility] - [place], [place] - [address] [place] [address] Phone: [phone] Fax: [phone]     Social Drivers of Health (SDOH) Social History: SDOH Screenings   Food Insecurity: No Food Insecurity (02/29/2024)  Housing: Low Risk  (02/29/2024)   Transportation Needs: No Transportation Needs (02/29/2024)  Utilities: Not At Risk (02/29/2024)  Alcohol Screen: Low Risk  (07/13/2023)  Depression (PHQ2-9): Low Risk  (02/07/2024)  Financial Resource Strain: Low Risk  (07/13/2023)  Physical Activity: Inactive (07/13/2023)  Social Connections: Socially Integrated (02/29/2024)  Stress: No Stress Concern Present (07/13/2023)  Tobacco Use: Low Risk  (02/29/2024)  Health Literacy: Adequate Health Literacy (07/13/2023)   SDOH Interventions:     Readmission Risk Interventions    09/09/2023   11:29 AM  Readmission Risk Prevention Plan  Transportation Screening Complete  Home Care Screening Complete  Medication Review (RN CM) Complete

## 2024-03-01 NOTE — Care Management Obs Status (Signed)
 MEDICARE OBSERVATION STATUS NOTIFICATION   Patient Details  Name: Justin Patel MRN: [id number] Date of Birth: 11/30/1950   Medicare Observation Status Notification Given:  Yes    Jermaine Neuharth W, CMA 03/01/2024, 1:43 PM

## 2024-03-01 NOTE — Telephone Encounter (Signed)
 I spoke to the wife and let her know that it has been canceled and whenever he gets out of the hospital you are more than welcome to give a call so that we can set him up a new appointment from being out of the hospital.

## 2024-03-01 NOTE — Progress Notes (Signed)
 PROGRESS NOTE    AH BOTT  FMW:[id number] DOB: 12/21/1950 DOA: 02/29/2024 PCP: Abbey Bruckner, MD  Chief Complaint  Patient presents with   Altered Mental Status   Tremors    Hospital Course:  Justin Patel is a 73 year old male with hypertension, chronic heart failure preserved EF, morbid obesity status post gastric bypass in 2010, diabetes, orthostatic hypotension, hyperlipidemia, anxiety, depression, chronic back pain, GERD, who presented with altered mental status. Patient's wife provides the majority of history at intake.  She reports that due to patient's chronic back pain he has been taking Tylenol  regularly but due to recent flare he was prescribed hydrocodone  and has been taking it daily for 4 days.  She reported the patient became increasingly somnolent and confused.  She also noted that the patient was increasingly hypotensive with a blood pressure of 70/40.  On arrival to the ED blood pressure was 100/60, pulse 85, head CT with chronic white matter changes and atrophy but no acute findings.  Patient was found to have an AKI with a creatinine of 2.0.  Subjective: This morning patient is much improved.  We discussed his improving kidney function he is very relieved.   Objective: Vitals:   02/29/24 2037 03/01/24 0604 03/01/24 0732 03/01/24 1137  BP: 135/65 130/75 (!) 154/80 (!) 149/97  Pulse: 87 76 76 80  Resp: 17 17 18 18   Temp: 98.2 F (36.8 C) (!) 97.4 F (36.3 C) 97.8 F (36.6 C) 98.4 F (36.9 C)  TempSrc:      SpO2: 100% 98% 99% 100%  Weight:      Height:        Intake/Output Summary (Last 24 hours) at 03/01/2024 1422 Last data filed at 03/01/2024 1033 Gross per 24 hour  Intake 739.2 ml  Output 1400 ml  Net -660.8 ml   Filed Weights   02/29/24 0916  Weight: 73.5 kg    Examination: General exam: Appears calm and comfortable, NAD  Respiratory system: No work of breathing, symmetric chest wall expansion Cardiovascular system: S1 & S2 heard, RRR.   Gastrointestinal system: Abdomen is nondistended, soft and nontender.  Neuro: Alert and oriented. No focal neurological deficits. Extremities: Symmetric, expected ROM Skin: No rashes, lesions Psychiatry: Demonstrates appropriate judgement and insight. Mood & affect appropriate for situation.   Assessment & Plan:  Principal Problem:   Encephalopathy Active Problems:   Acute encephalopathy   Ataxia    Acute toxic encephalopathy - Likely secondary to opioid overdose.  Patient was taking hydrocodone  and became increasingly hypotensive, somnolent - Opioids have now been discontinued. - Mental status much improved today.  Patient is much more alert, ambulating with physical therapy. - May require home health at discharge but with continued improvement he may not need PT at all  AKI - Creatinine on arrival 2.96, baseline appears closer to 1.0 - Creatinine downtrending now - AKI likely multifactorial, prerenal secondary to dehydration given decreased p.o. intake in the days prior to arrival, may have also been complicated by hypotension - Status post IV fluids creatinine has improved significantly - Patient now alert enough to tolerate p.o. hydration - Discontinue further IV fluids - Avoid hypotension  Chronic low back pain - Multiple prior spinal surgeries, chronic pain - Have discontinued opioid therapy due to overdose as above - Will approach with multimodal pain approach.  Does have allergy to Lyrica, and will need to avoid NSAIDs given AKI  Hypertension - Continue home meds  Heart failure preserved EF - Continue home meds  Prior gastric bypass - Continue home supplementation  Type 2 diabetes - Sliding scale insulin , titrate as needed  Hyperlipidemia - Continue statin  Depression - Continue home meds   DVT prophylaxis: Heparin    Code Status: Full Code Disposition: Inpatient pending clinical resolution.  Likely home with home health tomorrow pending repeat PT  evals in a.m.  Consultants:    Procedures:    Antimicrobials:  Anti-infectives (From admission, onward)    None       Data Reviewed: I have personally reviewed following labs and imaging studies CBC: Recent Labs  Lab 02/29/24 0950 03/01/24 0950  WBC 11.9* 7.4  NEUTROABS  --  5.5  HGB 11.0* 10.8*  HCT 35.2* 33.8*  MCV 108.0* 106.0*  PLT 197 181   Basic Metabolic Panel: Recent Labs  Lab 02/29/24 0950 03/01/24 0950  NA 137 141  K 4.7 4.2  CL 104 114*  CO2 20* 22  GLUCOSE 98 125*  BUN 52* 30*  CREATININE 2.96* 1.32*  CALCIUM  7.8* 7.4*   GFR: Estimated Creatinine Clearance: 45 mL/min (A) (by C-G formula based on SCr of 1.32 mg/dL (H)). Liver Function Tests: Recent Labs  Lab 02/29/24 0950 03/01/24 0950  AST 41 23  ALT 95* 62*  ALKPHOS 60 51  BILITOT 0.5 0.7  PROT 6.5 5.5*  ALBUMIN  2.8* 2.5*   CBG: Recent Labs  Lab 02/29/24 1700 03/01/24 0730 03/01/24 1136  GLUCAP 70 69* 129*    No results found for this or any previous visit (from the past 240 hours).   Radiology Studies: US  RENAL Result Date: 02/29/2024 CLINICAL DATA:  Acute renal insufficiency. EXAM: RENAL / URINARY TRACT ULTRASOUND COMPLETE COMPARISON:  None Available. FINDINGS: Right Kidney: Renal measurements: 11.4 x 5.1 x 4.7 cm = volume: 142 mL. There is mild parenchyma atrophy and cortical thinning. Mild increased echogenicity. No hydronephrosis or shadowing stone. There is a 2.5 cm upper pole cyst. Left Kidney: Renal measurements: 12.3 x 6.4 x 5.0 cm = volume: 207 mL. Mild parenchyma atrophy. Diffuse increased echogenicity. No hydronephrosis or shadowing stone. Two cysts measure up to 6 cm in the inferior pole. Bladder: Appears normal for degree of bladder distention. Other: None. IMPRESSION: 1. Mildly atrophic and echogenic kidneys, likely chronic kidney disease. No hydronephrosis or shadowing stone. 2. Bilateral renal cysts. Electronically Signed   By: Vanetta Chou M.D.   On: 02/29/2024  15:11   CT Head Wo Contrast Result Date: 02/29/2024 CLINICAL DATA:  Mental status change, unknown cause EXAM: CT HEAD WITHOUT CONTRAST TECHNIQUE: Contiguous axial images were obtained from the base of the skull through the vertex without intravenous contrast. RADIATION DOSE REDUCTION: This exam was performed according to the departmental dose-optimization program which includes automated exposure control, adjustment of the mA and/or kV according to patient size and/or use of iterative reconstruction technique. COMPARISON:  CT of the head dated September 07, 2023. FINDINGS: Brain: There is age-related cerebral and cerebellar volume loss present. There is mild to moderate periventricular white matter disease. There is no evidence of hemorrhage, mass acute cortical infarct or hydrocephalus. Vascular: Mild calcific atheromatous disease within the carotid siphons and vertebral arteries. Skull: Intact and unremarkable. Sinuses/Orbits: Status post left sinonasal surgery. There is mucosal disease within the ethmoid air cells, left maxillary sinus and the sphenoid sinuses. Other: None. IMPRESSION: 1. Age-related atrophy and mild to moderate cerebral white matter disease. 2. 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 Electronically Signed   By: Evalene  Hugh M.D.   On: 02/29/2024 11:59    Scheduled Meds:  ezetimibe   10 mg Oral Daily   finasteride   5 mg Oral Daily   fluticasone   2 spray Each Nare Daily   gabapentin   100 mg Oral BID   heparin   5,000 Units Subcutaneous Q12H   insulin  aspart  0-9 Units Subcutaneous TID WC   pantoprazole   40 mg Oral Daily   primidone   50 mg Oral QHS   rosuvastatin   40 mg Oral QHS   sucralfate   1 g Oral TID WC & HS   venlafaxine  XR  75 mg Oral Q breakfast   Continuous Infusions:   LOS: 0 days  MDM: Patient is high risk for one or more organ failure.  They necessitate ongoing hospitalization for continued  IV therapies and subsequent lab monitoring. Total time spent interpreting labs and vitals, reviewing the medical record, coordinating care amongst consultants and care team members, directly assessing and discussing care with the patient and/or family: 55 min  Chalsey Leeth, DO [facility]  To contact the attending physician between 7A-7P please use Epic Chat. To contact the covering physician during after hours 7P-7A, please review Amion.  03/01/2024, 2:22 PM   *This document has been created with the assistance of dictation software. Please excuse typographical errors. *

## 2024-03-01 NOTE — Plan of Care (Signed)
  Problem: Clinical Measurements: Goal: Ability to maintain clinical measurements within normal limits will improve Outcome: Progressing   Problem: Activity: Goal: Risk for activity intolerance will decrease Outcome: Progressing   Problem: Pain Managment: Goal: General experience of comfort will improve and/or be controlled Outcome: Progressing   Problem: Safety: Goal: Ability to remain free from injury will improve Outcome: Progressing

## 2024-03-01 NOTE — Progress Notes (Signed)
 Physical Therapy Treatment Patient Details Name: Justin Patel MRN: [id number] DOB: 09/01/1950 Today's Date: 03/01/2024   History of Present Illness 73 y.o. male with medical history significant of HTN/chronic HFpEF, morbid obesity status post gastric bypass in 2010, IIDM, orthostatic hypotension, HLD, anxiety/depression, chronic back pain and degenerative lumbar problem, GERD, sent by family member for evaluation of altered mentations.    PT Comments  Pt with significant improvement across the board with no myoclonic jerking, much improved cognition and no pain or hesitation with any activity.  He was able to circumambulate the nurses' station X 2 w/o AD with consistent and safe cadence.  Pt reports that he is not back to his baseline but feeling good about being able to manage at home.  Pt will benefit from ongoing PT to address functional limitations, d/c recs updated..     If plan is discharge home, recommend the following:     Can travel by private vehicle     Yes  Equipment Recommendations  None recommended by PT    Recommendations for Other Services       Precautions / Restrictions Precautions Precautions: Fall Recall of Precautions/Restrictions: Intact Restrictions Weight Bearing Restrictions Per Provider Order: No     Mobility  Bed Mobility Overal bed mobility: Independent                  Transfers Overall transfer level: Modified independent Equipment used: Rolling walker (2 wheels) Transfers: Sit to/from Stand Sit to Stand: Modified independent (Device/Increase time)           General transfer comment: Pt able to rise to standing from recliner multiple times w/o assist or hesitation    Ambulation/Gait Ambulation/Gait assistance: Supervision Gait Distance (Feet): 350 Feet Assistive device: None         General Gait Details: Pt was able to ambulate around the nurses' station w/o an AD and with consistent and safe cadence.  Pt with no LOBs,  jerking or miss-steps and though he does not feel totally at his baseline he ultimately did very well and showed good tolerance and safety.   Stairs             Wheelchair Mobility     Tilt Bed    Modified Rankin (Stroke Patients Only)       Balance Overall balance assessment: Modified Independent Sitting-balance support: No upper extremity supported Sitting balance-Leahy Scale: Normal     Standing balance support: No upper extremity supported Standing balance-Leahy Scale: Good Standing balance comment: no myoclonic jerks or knee buckling in standing, confident with prolonged static and dynamic standing tasks                            Communication Communication Communication: No apparent difficulties  Cognition Arousal: Alert Behavior During Therapy: WFL for tasks assessed/performed                             Following commands: Intact      Cueing Cueing Techniques: Verbal cues, Gestural cues  Exercises      General Comments General comments (skin integrity, edema, etc.): Pt with night-and-day difference from yesterday - no longer having pain or jerking, cognition greatly improved      Pertinent Vitals/Pain Pain Assessment Pain Assessment: No/denies pain    Home Living Family/patient expects to be discharged to:: Private residence  Prior Function            PT Goals (current goals can now be found in the care plan section) Progress towards PT goals: Progressing toward goals    Frequency    Min 2X/week      PT Plan      Co-evaluation              AM-PAC PT 6 Clicks Mobility   Outcome Measure  Help needed turning from your back to your side while in a flat bed without using bedrails?: None Help needed moving from lying on your back to sitting on the side of a flat bed without using bedrails?: None Help needed moving to and from a bed to a chair (including a wheelchair)?:  None Help needed standing up from a chair using your arms (e.g., wheelchair or bedside chair)?: None Help needed to walk in hospital room?: None Help needed climbing 3-5 steps with a railing? : A Little 6 Click Score: 23    End of Session Equipment Utilized During Treatment: Gait belt Activity Tolerance: Patient tolerated treatment well Patient left: with bed alarm set;with call bell/phone within reach   PT Visit Diagnosis: Muscle weakness (generalized) (M62.81);Difficulty in walking, not elsewhere classified (R26.2);Unsteadiness on feet (R26.81);Ataxic gait (R26.0)     Time: 8841-8784 PT Time Calculation (min) (ACUTE ONLY): 17 min  Charges:    $Gait Training: 8-22 mins PT General Charges $$ ACUTE PT VISIT: 1 Visit                     Carmin JONELLE Deed, DPT 03/01/2024, 2:27 PM

## 2024-03-01 NOTE — Plan of Care (Signed)
  Problem: Education: Goal: Ability to describe self-care measures that may prevent or decrease complications (Diabetes Survival Skills Education) will improve Outcome: Progressing   Problem: Coping: Goal: Ability to adjust to condition or change in health will improve Outcome: Progressing   Problem: Fluid Volume: Goal: Ability to maintain a balanced intake and output will improve Outcome: Progressing   Problem: Health Behavior/Discharge Planning: Goal: Ability to identify and utilize available resources and services will improve Outcome: Progressing   Problem: Metabolic: Goal: Ability to maintain appropriate glucose levels will improve Outcome: Progressing   Problem: Nutritional: Goal: Maintenance of adequate nutrition will improve Outcome: Progressing   Problem: Skin Integrity: Goal: Risk for impaired skin integrity will decrease Outcome: Progressing   Problem: Tissue Perfusion: Goal: Adequacy of tissue perfusion will improve Outcome: Progressing   Problem: Activity: Goal: Risk for activity intolerance will decrease Outcome: Progressing   Problem: Nutrition: Goal: Adequate nutrition will be maintained Outcome: Progressing

## 2024-03-01 NOTE — Plan of Care (Signed)
 The patient c/o back pain 10/10. PRN Dilaudid  and Tylenol  650mg  was given for pain with little effect.

## 2024-03-02 ENCOUNTER — Inpatient Hospital Stay

## 2024-03-02 ENCOUNTER — Inpatient Hospital Stay: Admitting: Internal Medicine

## 2024-03-02 ENCOUNTER — Other Ambulatory Visit: Payer: Self-pay

## 2024-03-02 DIAGNOSIS — R270 Ataxia, unspecified: Secondary | ICD-10-CM | POA: Diagnosis not present

## 2024-03-02 DIAGNOSIS — G9340 Encephalopathy, unspecified: Secondary | ICD-10-CM | POA: Diagnosis not present

## 2024-03-02 LAB — COMPREHENSIVE METABOLIC PANEL WITH GFR
ALT: 56 U/L — ABNORMAL HIGH (ref 0–44)
AST: 30 U/L (ref 15–41)
Albumin: 2.7 g/dL — ABNORMAL LOW (ref 3.5–5.0)
Alkaline Phosphatase: 54 U/L (ref 38–126)
Anion gap: 11 (ref 5–15)
BUN: 16 mg/dL (ref 8–23)
CO2: 23 mmol/L (ref 22–32)
Calcium: 7.8 mg/dL — ABNORMAL LOW (ref 8.9–10.3)
Chloride: 108 mmol/L (ref 98–111)
Creatinine, Ser: 0.89 mg/dL (ref 0.61–1.24)
GFR, Estimated: >60 mL/min (ref >60)
Glucose, Bld: 115 mg/dL — ABNORMAL HIGH (ref 70–99)
Potassium: 3.8 mmol/L (ref 3.5–5.1)
Sodium: 142 mmol/L (ref 135–145)
Total Bilirubin: 0.7 mg/dL (ref 0.0–1.2)
Total Protein: 5.9 g/dL — ABNORMAL LOW (ref 6.5–8.1)

## 2024-03-02 LAB — GLUCOSE, CAPILLARY
Glucose-Capillary: 116 mg/dL — ABNORMAL HIGH (ref 70–99)
Glucose-Capillary: 130 mg/dL — ABNORMAL HIGH (ref 70–99)

## 2024-03-02 MED ORDER — HYDROCODONE-ACETAMINOPHEN 5-325 MG PO TABS: 2.0000 | ORAL_TABLET | Freq: Four times a day (QID) | ORAL | Status: DC | PRN

## 2024-03-02 MED ORDER — GABAPENTIN 100 MG PO CAPS
100.0000 mg | ORAL_CAPSULE | Freq: Two times a day (BID) | ORAL | 0 refills | Status: DC
  Filled 2024-03-02: qty 60, 30d supply, fill #0

## 2024-03-02 MED ORDER — LIDOCAINE 5 % EX PTCH
1.0000 | MEDICATED_PATCH | CUTANEOUS | 0 refills | Status: AC
  Filled 2024-03-02 (×2): qty 30, 30d supply, fill #0

## 2024-03-02 MED ORDER — AMLODIPINE BESYLATE 5 MG PO TABS
5.0000 mg | ORAL_TABLET | Freq: Every day | ORAL | Status: DC
  Administered 2024-03-02: 5 mg via ORAL
  Filled 2024-03-02: qty 1

## 2024-03-02 NOTE — Plan of Care (Signed)
  Problem: Education: Goal: Ability to describe self-care measures that may prevent or decrease complications (Diabetes Survival Skills Education) will improve Outcome: Progressing   Problem: Coping: Goal: Ability to adjust to condition or change in health will improve Outcome: Progressing   Problem: Fluid Volume: Goal: Ability to maintain a balanced intake and output will improve Outcome: Progressing   Problem: Health Behavior/Discharge Planning: Goal: Ability to identify and utilize available resources and services will improve Outcome: Progressing   Problem: Metabolic: Goal: Ability to maintain appropriate glucose levels will improve Outcome: Progressing   Problem: Nutritional: Goal: Maintenance of adequate nutrition will improve Outcome: Progressing   Problem: Tissue Perfusion: Goal: Adequacy of tissue perfusion will improve Outcome: Progressing   Problem: Education: Goal: Knowledge of General Education information will improve Description: Including pain rating scale, medication(s)/side effects and non-pharmacologic comfort measures Outcome: Progressing   Problem: Health Behavior/Discharge Planning: Goal: Ability to manage health-related needs will improve Outcome: Progressing   Problem: Activity: Goal: Risk for activity intolerance will decrease Outcome: Progressing   Problem: Pain Managment: Goal: General experience of comfort will improve and/or be controlled Outcome: Progressing   Problem: Safety: Goal: Ability to remain free from injury will improve Outcome: Progressing   Problem: Skin Integrity: Goal: Risk for impaired skin integrity will decrease Outcome: Progressing

## 2024-03-02 NOTE — Discharge Instructions (Signed)
 Please be very careful with your pain medication.  You may take 650 mg of Tylenol  twice a day, please start with Tylenol  if you are in pain.  If Tylenol  does not help then you may proceed with the previously prescribed Norco.  Do not exceed 4 doses of Norco in a day. DO NOT drink alcohol while taking these medications. Please follow-up closely with the pain management clinic.

## 2024-03-02 NOTE — Discharge Summary (Signed)
 DISCHARGE SUMMARY    Justin Patel FMW:[id number] DOB: 1950/12/11 DOA: 02/29/2024  PCP: Abbey Bruckner, MD  Admit date: 02/29/2024 Discharge date: 03/02/2024   Recommendations for Outpatient Follow-up:  Follow up with PCP in 1-2 weeks to continue working on pain management strategies.  Hospital Course: GLADSTONE ROSAS 73 year old male with hypertension, chronic heart failure with preserved EF, history of morbid obesity status post gastric bypass in 2010, diabetes, orthostatic hypotension, hyperlipidemia, anxiety, depression, chronic back pain status post multiple lumbar surgeries, GERD, who presented with altered mental status.  Patient was brought in by EMS with somnolence and a blood pressure of 70/40.  He was also noted to have AKI with creatinine of 2.96.  He was admitted and fluid resuscitated.  Head CT was negative for acute changes. Patient gradually became more alert and returned to his physiologic baseline.  Acute toxic encephalopathy thought to be secondary to polypharmacy, opioid overdose and hypotension. Unfortunately patient does suffer from chronic pain and is on prescribed Norco.  We reviewed appropriate dosing of this medication as well as alternatives for pain management including Tylenol , lidocaine  patch.  We also reiterated that the patient should not be drinking alcohol while taking these pain medications. Gradually he is AKI resolved with a creatinine better than baseline.  He is discharging home today with plans to follow-up closely with his pain management clinic and neurosurgeon.  Acute toxic encephalopathy - Likely secondary to opioid overdose.  Patient was taking hydrocodone , gabapentin , alcohol, multiple hypertensives and became increasingly hypotensive, somnolent - Medications have been deprescribed - Mental status much improved today.  Patient is much more alert, ambulating with physical therapy.   AKI - Creatinine on arrival 2.96, creatinine is down trended to  baseline now - AKI likely multifactorial, prerenal secondary to dehydration given decreased p.o. intake in the days prior to arrival, may have also been complicated by hypotension - Status post IV fluids creatinine has improved significantly  Chronic low back pain - Multiple prior spinal surgeries, chronic pain - Will approach with multimodal pain approach.  Does have allergy to Lyrica, and will need to avoid NSAIDs given AKI - Discussed appropriate dosing of Tylenol , Norco.  Advised not to drink alcohol - Follows with pain clinic.  Continue   Hypertension - Continue home meds - Hold amlodipine  at discharge.  Resume ARB   Heart failure preserved EF - Continue home meds   Prior gastric bypass - Continue home supplementation   Type 2 diabetes - Sliding scale insulin , titrate as needed   Hyperlipidemia - Continue statin   Depression - Continue home meds  Elevated LFTs, chronic - ALT elevated.  Downtrending since admission.  Follow-up outpatient with PCP to continue monitoring.   Discharge Instructions  Discharge Instructions     Call MD for:  difficulty breathing, headache or visual disturbances   Complete by: As directed    Call MD for:  persistant dizziness or light-headedness   Complete by: As directed    Call MD for:  persistant nausea and vomiting   Complete by: As directed    Call MD for:  severe uncontrolled pain   Complete by: As directed    Call MD for:  temperature >100.4   Complete by: As directed    Diet general   Complete by: As directed    Discharge instructions   Complete by: As directed    Follow up with your primary care physician to discuss the medication changes during this admission   Increase activity slowly  Complete by: As directed       Allergies as of 03/02/2024       Reactions   Altace [ramipril] Anaphylaxis   Ace Inhibitors Hives   Levaquin [levofloxacin In D5w] Hives   Lyrica [pregabalin] Other (See Comments)   Edema    Metformin Diarrhea   High doses cause diarrhea.   Trulicity  [dulaglutide ] Nausea And Vomiting        Medication List     STOP taking these medications    amLODipine  5 MG tablet Commonly known as: NORVASC    oxyCODONE -acetaminophen  7.5-325 MG tablet Commonly known as: PERCOCET       TAKE these medications    CALCIUM  600/VITAMIN D  PO Take 1 tablet by mouth daily after supper.   Cholecalciferol  50 MCG (2000 UT) Tabs Take 2,000 Units by mouth daily after supper.   esomeprazole  40 MG capsule Commonly known as: NEXIUM  Take 1 capsule (40 mg total) by mouth daily.   ezetimibe  10 MG tablet Commonly known as: ZETIA  Take 1 tablet (10 mg total) by mouth daily.   finasteride  5 MG tablet Commonly known as: PROSCAR  Take 1 tablet (5 mg total) by mouth daily.   fluticasone  50 MCG/ACT nasal spray Commonly known as: FLONASE  Place 2 sprays into both nostrils daily.   gabapentin  100 MG capsule Commonly known as: NEURONTIN  Take 1 capsule (100 mg total) by mouth 2 (two) times daily. What changed:  medication strength how much to take   HYDROcodone -acetaminophen  7.5-325 MG tablet Commonly known as: NORCO Take 1 tablet by mouth 4 (four) times daily as needed.   lidocaine  5 % Commonly known as: LIDODERM  Place 1 patch onto the skin daily. Remove & Discard patch within 12 hours or as directed by MD   linagliptin  5 MG Tabs tablet Commonly known as: Tradjenta  Take 1 tablet (5 mg total) by mouth daily.   metroNIDAZOLE  0.75 % gel Commonly known as: METROGEL  Apply 1 Application topically at bedtime. qhs to face for Rosacea   multivitamin capsule Take 1 capsule by mouth daily. With copper  and zinc    primidone  50 MG tablet Commonly known as: MYSOLINE  Take 1 tablet (50 mg total) by mouth at bedtime.   rosuvastatin  40 MG tablet Commonly known as: CRESTOR  Take 1 tablet (40 mg total) by mouth at bedtime.   Rybelsus  3 MG Tabs Generic drug: Semaglutide  Take 1 tablet (3 mg  total) by mouth daily. What changed: Another medication with the same name was removed. Continue taking this medication, and follow the directions you see here.   sucralfate  1 g tablet Commonly known as: CARAFATE  Take 1 tablet (1 g total) by mouth 4 (four) times daily.   valsartan  80 MG tablet Commonly known as: DIOVAN  Take 1 tablet (80 mg total) by mouth at bedtime.   venlafaxine  XR 75 MG 24 hr capsule Commonly known as: EFFEXOR -XR Take 1 capsule (75 mg total) by mouth daily.        Allergies  Allergen Reactions   Altace [Ramipril] Anaphylaxis   Ace Inhibitors Hives   Levaquin [Levofloxacin In D5w] Hives   Lyrica [Pregabalin] Other (See Comments)    Edema   Metformin Diarrhea    High doses cause diarrhea.    Trulicity  [Dulaglutide ] Nausea And Vomiting    Consultations:    Procedures/Studies: US  RENAL Result Date: 02/29/2024 CLINICAL DATA:  Acute renal insufficiency. EXAM: RENAL / URINARY TRACT ULTRASOUND COMPLETE COMPARISON:  None Available. FINDINGS: Right Kidney: Renal measurements: 11.4 x 5.1 x 4.7 cm = volume: 142  mL. There is mild parenchyma atrophy and cortical thinning. Mild increased echogenicity. No hydronephrosis or shadowing stone. There is a 2.5 cm upper pole cyst. Left Kidney: Renal measurements: 12.3 x 6.4 x 5.0 cm = volume: 207 mL. Mild parenchyma atrophy. Diffuse increased echogenicity. No hydronephrosis or shadowing stone. Two cysts measure up to 6 cm in the inferior pole. Bladder: Appears normal for degree of bladder distention. Other: None. IMPRESSION: 1. Mildly atrophic and echogenic kidneys, likely chronic kidney disease. No hydronephrosis or shadowing stone. 2. Bilateral renal cysts. Electronically Signed   By: Vanetta Chou M.D.   On: 02/29/2024 15:11   CT Head Wo Contrast Result Date: 02/29/2024 CLINICAL DATA:  Mental status change, unknown cause EXAM: CT HEAD WITHOUT CONTRAST TECHNIQUE: Contiguous axial images were obtained from the base of the  skull through the vertex without intravenous contrast. RADIATION DOSE REDUCTION: This exam was performed according to the departmental dose-optimization program which includes automated exposure control, adjustment of the mA and/or kV according to patient size and/or use of iterative reconstruction technique. COMPARISON:  CT of the head dated September 07, 2023. FINDINGS: Brain: There is age-related cerebral and cerebellar volume loss present. There is mild to moderate periventricular white matter disease. There is no evidence of hemorrhage, mass acute cortical infarct or hydrocephalus. Vascular: Mild calcific atheromatous disease within the carotid siphons and vertebral arteries. Skull: Intact and unremarkable. Sinuses/Orbits: Status post left sinonasal surgery. There is mucosal disease within the ethmoid air cells, left maxillary sinus and the sphenoid sinuses. Other: None. IMPRESSION: 1. Age-related atrophy and mild to moderate cerebral white matter disease. 2. 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 Electronically Signed   By: Evalene Coho M.D.   On: 02/29/2024 11:59      Discharge Exam: Vitals:   03/02/24 0458 03/02/24 0724  BP: (!) 148/85 (!) 162/91  Pulse: 70 65  Resp: 18 15  Temp: 98.3 F (36.8 C) 98 F (36.7 C)  SpO2: 97% 95%   Vitals:   03/01/24 1634 03/01/24 1933 03/02/24 0458 03/02/24 0724  BP: (!) 157/84 (!) 158/83 (!) 148/85 (!) 162/91  Pulse: 74 78 70 65  Resp: 18 17 18 15   Temp: 98.3 F (36.8 C) 99 F (37.2 C) 98.3 F (36.8 C) 98 F (36.7 C)  TempSrc:  Oral    SpO2: 99% 97% 97% 95%  Weight:      Height:        Constitutional:  Normal appearance. Non toxic-appearing.  HENT: Head Normocephalic and atraumatic.  Mucous membranes are moist.  Eyes:  Extraocular intact. Conjunctivae normal.  Cardiovascular: Rate and Rhythm: Normal rate and regular rhythm.  Pulmonary: Non labored, symmetric rise of  chest wall.  Skin: warm and dry. not jaundiced.  Neurological: No focal deficit present. alert. Oriented.  Psychiatric: Mood and Affect congruent.    The results of significant diagnostics from this hospitalization (including imaging, microbiology, ancillary and laboratory) are listed below for reference.     Microbiology: No results found for this or any previous visit (from the past 240 hours).   Labs: BNP (last 3 results) No results for input(s): BNP in the last 8760 hours. Basic Metabolic Panel: Recent Labs  Lab 02/29/24 0950 03/01/24 0950 03/02/24 0852  NA 137 141 142  K 4.7 4.2 3.8  CL 104 114* 108  CO2 20* 22 23  GLUCOSE 98 125* 115*  BUN 52* 30* 16  CREATININE 2.96* 1.32* 0.89  CALCIUM  7.8* 7.4*  7.8*   Liver Function Tests: Recent Labs  Lab 02/29/24 0950 03/01/24 0950 03/02/24 0852  AST 41 23 30  ALT 95* 62* 56*  ALKPHOS 60 51 54  BILITOT 0.5 0.7 0.7  PROT 6.5 5.5* 5.9*  ALBUMIN  2.8* 2.5* 2.7*   No results for input(s): LIPASE, AMYLASE in the last 168 hours. No results for input(s): AMMONIA in the last 168 hours. CBC: Recent Labs  Lab 02/29/24 0950 03/01/24 0950  WBC 11.9* 7.4  NEUTROABS  --  5.5  HGB 11.0* 10.8*  HCT 35.2* 33.8*  MCV 108.0* 106.0*  PLT 197 181   Cardiac Enzymes: Recent Labs  Lab 02/29/24 0950  CKTOTAL 94   BNP: Invalid input(s): POCBNP CBG: Recent Labs  Lab 03/01/24 1136 03/01/24 1633 03/01/24 1939 03/02/24 0721 03/02/24 1135  GLUCAP 129* 123* 132* 116* 130*   D-Dimer No results for input(s): DDIMER in the last 72 hours. Hgb A1c No results for input(s): HGBA1C in the last 72 hours. Lipid Profile No results for input(s): CHOL, HDL, LDLCALC, TRIG, CHOLHDL, LDLDIRECT in the last 72 hours. Thyroid  function studies No results for input(s): TSH, T4TOTAL, T3FREE, THYROIDAB in the last 72 hours.  Invalid input(s): FREET3 Anemia work up No results for input(s): VITAMINB12,  FOLATE, FERRITIN, TIBC, IRON , RETICCTPCT in the last 72 hours. Urinalysis    Component Value Date/Time   COLORURINE YELLOW (A) 02/29/2024 0918   APPEARANCEUR CLEAR (A) 02/29/2024 0918   APPEARANCEUR Clear 05/01/2014 0928   LABSPEC 1.027 02/29/2024 0918   LABSPEC 1.031 05/01/2014 0928   PHURINE 5.0 02/29/2024 0918   GLUCOSEU NEGATIVE 02/29/2024 0918   GLUCOSEU Negative 05/01/2014 0928   HGBUR NEGATIVE 02/29/2024 0918   BILIRUBINUR NEGATIVE 02/29/2024 0918   BILIRUBINUR neg 01/11/2022 1147   BILIRUBINUR Negative 05/01/2014 0928   KETONESUR NEGATIVE 02/29/2024 0918   PROTEINUR 30 (A) 02/29/2024 0918   UROBILINOGEN 0.2 01/11/2022 1147   NITRITE NEGATIVE 02/29/2024 0918   LEUKOCYTESUR NEGATIVE 02/29/2024 0918   LEUKOCYTESUR Negative 05/01/2014 0928   Sepsis Labs Recent Labs  Lab 02/29/24 0950 03/01/24 0950  WBC 11.9* 7.4   Microbiology No results found for this or any previous visit (from the past 240 hours).   Time coordinating discharge: 32 min   SIGNED: Lorane Poland, MD  [facility] 03/02/2024, 1:17 PM Pager   If 7PM-7AM, please contact night-coverage

## 2024-03-05 ENCOUNTER — Telehealth: Payer: Self-pay

## 2024-03-05 ENCOUNTER — Telehealth (HOSPITAL_COMMUNITY): Payer: Self-pay | Admitting: Pharmacy Technician

## 2024-03-05 NOTE — Telephone Encounter (Signed)
 Pharmacy Patient Advocate Encounter   Received notification from Fax that prior authorization for Lidocaine  5% patches is required/requested.   Insurance verification completed.   The patient is insured through [employer] .   Per test claim: PA required; PA submitted to above mentioned insurance via CoverMyMeds Key/confirmation #/EOC [id number] Status is pending

## 2024-03-05 NOTE — Telephone Encounter (Signed)
 Pharmacy Patient Advocate Encounter  Received notification from [employer] that Prior Authorization for Lidocaine  5% patches  has been APPROVED from 03/05/2024 to 08/08/2024   PA #/Case ID/Reference #: [id number]

## 2024-03-05 NOTE — Transitions of Care (Post Inpatient/ED Visit) (Unsigned)
   03/05/2024  Name: Justin Patel MRN: [id number] DOB: 03-Sep-1950  Today's TOC FU Call Status: Today's TOC FU Call Status:: Unsuccessful Call (1st Attempt) Unsuccessful Call (1st Attempt) Date: 03/05/24  Attempted to reach the patient regarding the most recent Inpatient/ED visit.  Follow Up Plan: Additional outreach attempts will be made to reach the patient to complete the Transitions of Care (Post Inpatient/ED visit) call.   Signature Julian Lemmings, LPN [facility] Nurse Health Advisor Direct Dial [phone]

## 2024-03-06 NOTE — Transitions of Care (Post Inpatient/ED Visit) (Unsigned)
   03/06/2024  Name: Justin Patel MRN: [id number] DOB: Jun 24, 1951  Today's TOC FU Call Status: Today's TOC FU Call Status:: Unsuccessful Call (2nd Attempt) Unsuccessful Call (1st Attempt) Date: 03/05/24 Unsuccessful Call (2nd Attempt) Date: 03/06/24  Attempted to reach the patient regarding the most recent Inpatient/ED visit.  Follow Up Plan: Additional outreach attempts will be made to reach the patient to complete the Transitions of Care (Post Inpatient/ED visit) call.   Signature Julian Lemmings, LPN [facility] Nurse Health Advisor Direct Dial [phone]

## 2024-03-07 NOTE — Transitions of Care (Post Inpatient/ED Visit) (Signed)
   03/07/2024  Name: Justin Patel MRN: [id number] DOB: 08-22-1950  Today's TOC FU Call Status: Today's TOC FU Call Status:: Successful TOC FU Call Completed Unsuccessful Call (1st Attempt) Date: 03/05/24 Unsuccessful Call (2nd Attempt) Date: 03/06/24 [facility] FU Call Complete Date: 03/07/24 Patient's Name and Date of Birth confirmed.  Transition Care Management Follow-up Telephone Call Date of Discharge: 03/02/24 Discharge Facility: [facility]) Type of Discharge: Inpatient Admission Primary Inpatient Discharge Diagnosis:: dehydration How have you been since you were released from the hospital?: Better Any questions or concerns?: No  Items Reviewed: Did you receive and understand the discharge instructions provided?: Yes Medications obtained,verified, and reconciled?: Yes (Medications Reviewed) Any new allergies since your discharge?: No Dietary orders reviewed?: Yes Do you have support at home?: Yes People in Home [RPT]: spouse  Medications Reviewed Today: Medications Reviewed Today   Medications were not reviewed in this encounter     Home Care and Equipment/Supplies: Were Home Health Services Ordered?: NA Any new equipment or medical supplies ordered?: NA  Functional Questionnaire: Do you need assistance with bathing/showering or dressing?: No Do you need assistance with meal preparation?: No Do you need assistance with eating?: No Do you have difficulty maintaining continence: No Do you need assistance with getting out of bed/getting out of a chair/moving?: No Do you have difficulty managing or taking your medications?: No  Follow up appointments reviewed: PCP Follow-up appointment confirmed?: No (declined appt) MD Provider Line Number:[phone] Given: No Specialist Hospital Follow-up appointment confirmed?: NA Do you need transportation to your follow-up appointment?: No Do you understand care options if your condition(s) worsen?:  Yes-patient verbalized understanding    SIGNATURE Julian Lemmings, LPN [facility] Nurse Health Advisor Direct Dial [phone]

## 2024-03-14 DIAGNOSIS — M546 Pain in thoracic spine: Secondary | ICD-10-CM | POA: Diagnosis not present

## 2024-03-14 DIAGNOSIS — M5416 Radiculopathy, lumbar region: Secondary | ICD-10-CM | POA: Diagnosis not present

## 2024-03-14 DIAGNOSIS — F1120 Opioid dependence, uncomplicated: Secondary | ICD-10-CM | POA: Diagnosis not present

## 2024-03-14 DIAGNOSIS — M4326 Fusion of spine, lumbar region: Secondary | ICD-10-CM | POA: Diagnosis not present

## 2024-03-22 ENCOUNTER — Encounter: Payer: Self-pay | Admitting: Medical

## 2024-03-22 ENCOUNTER — Ambulatory Visit: Attending: Medical | Admitting: Medical

## 2024-03-22 VITALS — BP 140/70 | HR 78 | Ht 67.0 in | Wt 162.6 lb

## 2024-03-22 DIAGNOSIS — R55 Syncope and collapse: Secondary | ICD-10-CM | POA: Diagnosis not present

## 2024-03-22 DIAGNOSIS — R770 Abnormality of albumin: Secondary | ICD-10-CM | POA: Diagnosis not present

## 2024-03-22 DIAGNOSIS — I5032 Chronic diastolic (congestive) heart failure: Secondary | ICD-10-CM | POA: Diagnosis not present

## 2024-03-22 DIAGNOSIS — I951 Orthostatic hypotension: Secondary | ICD-10-CM

## 2024-03-22 DIAGNOSIS — I10 Essential (primary) hypertension: Secondary | ICD-10-CM | POA: Diagnosis not present

## 2024-03-22 DIAGNOSIS — E782 Mixed hyperlipidemia: Secondary | ICD-10-CM

## 2024-03-22 DIAGNOSIS — N179 Acute kidney failure, unspecified: Secondary | ICD-10-CM | POA: Diagnosis not present

## 2024-03-22 NOTE — Patient Instructions (Signed)
 Medication Instructions:  No changes at this time.   *If you need a refill on your cardiac medications before your next appointment, please call your pharmacy*  Lab Work: CMET today  If you have labs (blood work) drawn today and your tests are completely normal, you will receive your results only by: MyChart Message (if you have MyChart) OR A paper copy in the mail If you have any lab test that is abnormal or we need to change your treatment, we will call you to review the results.  Testing/Procedures: None  Follow-Up: At [facility], you and your health needs are our priority.  As part of our continuing mission to provide you with exceptional heart care, our providers are all part of one team.  This team includes your primary Cardiologist (physician) and Advanced Practice Providers or APPs (Physician Assistants and Nurse Practitioners) who all work together to provide you with the care you need, when you need it.  Your next appointment:   6 month(s)  Provider:   Deatrice Cage, MD or Cadence Franchester, PA-C

## 2024-03-22 NOTE — Progress Notes (Signed)
 Cardiology Office Note   Date:  03/22/2024  ID:  RACHEL SAMPLES, DOB [birth year], MRN [id number] PCP: Abbey Bruckner, MD  [facility] Providers Cardiologist:  Deatrice Cage, MD   History of Present Illness Justin Patel is a 73 y.o. male with a hx of  chronic diastolic heart failure, morbid obesity s/p gastric bypass 2010, iron  deficiency anemia, diabetes mellitus, orthostatic hypotension, HTN, HLD, anxiety, prior alcohol abuse, degenerative disc disease with previous back surgery and GERD.    Echo in 2020 showed LVEF 55-60%, no valvular abnormalities.    The patient was seen in there ER 03/14/23 for syncope. Work-up showed K3.3, Scr 1.73. EKG with NSR and PACs. Hs trop negative. Vitals were normal. Patient was discharged home without intervention.   Patient was seen October 2024 after syncope episode.  Orthostatics were positive.  EKG showed normal sinus rhythm with PACs and PVCs.  Lasix , amlodipine  and Toprol  were stopped.  Echo showed EF 60 to 65%, mild LVH, grade 1 diastolic dysfunction.  Heart monitor showed predominantly normal sinus rhythm, 1 run of ventricular tachycardia, 62 runs of SVT, longest lasting 29 seconds, rare PACs, occasional PVCs with a burden of 1.7%. Patient was seen in follow-up November 2024.  He was back on his BP medications without any symptoms/issues.   The patient was admitted 06/2023 for 3 days for for fall from standing, right flank hematoma and ABL anemia. He presented 07/02/23 with a fall from standing after voiding. He turned and fell, hitting his right back on the side of the tub. He was found to have right flank hematoma. He was found to be profoundly orthostatic and given IVF and intermittently midodrine . MRI of the brain was negative.  PT recommended HH. He was not sent home on midodrine . BP improved and valsartan  and amlodipine  were restarted.    He was hospitalized in late 08/2023 with accidental Tylenol  overdose, syncope, AKI on CKD3, orthostatic  hypotension, oitis media treated conservatively. Midodrine  was restarted.   The patient was admitted 02/2024 for acute toxic encephalopathy likely secondary to opioid overdose with alcohol use, AKI, hypotension, elevated LFTs treated with IVF. Brain imaging was negative. Amlodipine  was held and Valsartan  was restarted.   Today, the patient reports he is still tired from discharge. He denies lightheadedness and dizziness. He is sleeping good. He denies chest pain or SOB. He is no longer drinking alcohol. Mayn of his pain/psych medications were adjusted.   Studies Reviewed EKG Interpretation Date/Time:  Thursday March 22 2024 15:14:58 EDT Ventricular Rate:  78 PR Interval:  160 QRS Duration:  70 QT Interval:  370 QTC Calculation: 421 R Axis:   24  Text Interpretation: Sinus rhythm with Premature atrial complexes When compared with ECG of 29-Feb-2024 09:17, Premature atrial complexes are now Present Confirmed by Franchester, Denzil Bristol ([id number]) on 03/22/2024 3:17:48 PM    Heart monitor 05/2023 Patch Wear Time:  13 days and 21 hours (2024-10-05T11:21:07-399 to 2024-10-19T09:01:40-0400)   Patient had a min HR of 47 bpm, max HR of 214 bpm, and avg HR of 73 bpm. Predominant underlying rhythm was Sinus Rhythm. 1 run of Ventricular Tachycardia occurred lasting 4 beats with a max rate of 194 bpm (avg 169 bpm). 62 Supraventricular Tachycardia  runs occurred, the run with the fastest interval lasting 29.9 secs with a max rate of 214 bpm (avg 166 bpm); the run with the fastest interval was also the longest. Isolated SVEs were rare (<1.0%), SVE Couplets were rare (<1.0%), and SVE Triplets were  rare (<1.0%). Isolated VEs were occasional (1.7%, 24606), VE Couplets were rare (<1.0%, 122), and VE Triplets were rare (<1.0%, 5). Ventricular Bigeminy and Trigeminy were present.    Echo 05/2023 1. Left ventricular ejection fraction, by estimation, is 60 to 65%. The  left ventricle has normal function. The left ventricle  has no regional  wall motion abnormalities. There is mild left ventricular hypertrophy.  Left ventricular diastolic parameters  are consistent with Grade I diastolic dysfunction (impaired relaxation).  The average left ventricular global longitudinal strain is -18.7 %. The  global longitudinal strain is normal.   2. Right ventricular systolic function is normal. The right ventricular  size is normal.   3. The mitral valve is normal in structure. No evidence of mitral valve  regurgitation.   4. The aortic valve is tricuspid. Aortic valve regurgitation is not  visualized.       Physical Exam VS:  BP (!) 140/70 (BP Location: Left Arm, Patient Position: Sitting, Cuff Size: Normal)   Pulse 78   Ht 5' 7 (1.702 m)   Wt 162 lb 9.6 oz (73.8 kg)   SpO2 99%   BMI 25.47 kg/m        Wt Readings from Last 3 Encounters:  03/22/24 162 lb 9.6 oz (73.8 kg)  02/29/24 162 lb (73.5 kg)  02/07/24 162 lb 6.4 oz (73.7 kg)    GEN: Well nourished, well developed in no acute distress NECK: No JVD; No carotid bruits CARDIAC: RRR, no murmurs, rubs, gallops RESPIRATORY:  Clear to auscultation without rales, wheezing or rhonchi  ABDOMEN: Soft, non-tender, non-distended EXTREMITIES:  No edema; No deformity   ASSESSMENT AND PLAN  Orthostatic hypotension HTN H/o syncope Patient has history of syncope found to have orthostatic hypotension. Patient underwent went work-up for syncope last year that was unremarkable. Recent admission for acute encephalopathy suspected 2/2 opiod overdose (polypharmacy), hypotension, AKI s/pIVF.  Amlodipine  was held and Valsartan  was restarted. He denies lightheadedness or dizziness. BP today is 140/70. We will continue Valsartan  80mg  daily.   AKI Scr up to 2.96 in the hospital. Scr at d/c was 0.8. Check CMET on ARB.   Low albumin  Albumin  2.7. Recommended increase protein intake and protein shakes for supplementation. CMET today.   Chronic diastolic heart failure Patient  is euvolemic on exam. Echo in 2024 showed LVEF 60-65%, mild LVH, G1DD.   HLD LDL 66. Continue Zetia  and Crestor  40mg  daily.        Dispo: Follow-up in 6 months  Signed, Shiheem Corporan VEAR Fishman, PA-C

## 2024-03-23 ENCOUNTER — Ambulatory Visit: Payer: Self-pay | Admitting: Medical

## 2024-03-23 LAB — COMPREHENSIVE METABOLIC PANEL WITH GFR
ALT: 45 IU/L — ABNORMAL HIGH (ref 0–44)
AST: 37 IU/L (ref 0–40)
Albumin: 3.8 g/dL (ref 3.8–4.8)
Alkaline Phosphatase: 72 IU/L (ref 44–121)
BUN/Creatinine Ratio: 12 (ref 10–24)
BUN: 13 mg/dL (ref 8–27)
Bilirubin Total: 0.2 mg/dL (ref 0.0–1.2)
CO2: 23 mmol/L (ref 20–29)
Calcium: 9.1 mg/dL (ref 8.6–10.2)
Chloride: 102 mmol/L (ref 96–106)
Creatinine, Ser: 1.09 mg/dL (ref 0.76–1.27)
Globulin, Total: 2.3 g/dL (ref 1.5–4.5)
Glucose: 125 mg/dL — ABNORMAL HIGH (ref 70–99)
Potassium: 4.8 mmol/L (ref 3.5–5.2)
Sodium: 136 mmol/L (ref 134–144)
Total Protein: 6.1 g/dL (ref 6.0–8.5)
eGFR: 72 mL/min/1.73 (ref >59)

## 2024-03-26 ENCOUNTER — Ambulatory Visit

## 2024-03-26 VITALS — BP 122/70 | HR 86 | Temp 98.5°F | Ht 67.0 in | Wt 159.6 lb

## 2024-03-26 DIAGNOSIS — Z7984 Long term (current) use of oral hypoglycemic drugs: Secondary | ICD-10-CM

## 2024-03-26 DIAGNOSIS — G894 Chronic pain syndrome: Secondary | ICD-10-CM | POA: Diagnosis not present

## 2024-03-26 DIAGNOSIS — M5416 Radiculopathy, lumbar region: Secondary | ICD-10-CM

## 2024-03-26 DIAGNOSIS — E1122 Type 2 diabetes mellitus with diabetic chronic kidney disease: Secondary | ICD-10-CM

## 2024-03-26 DIAGNOSIS — Z7985 Long-term (current) use of injectable non-insulin antidiabetic drugs: Secondary | ICD-10-CM | POA: Diagnosis not present

## 2024-03-26 DIAGNOSIS — I10 Essential (primary) hypertension: Secondary | ICD-10-CM | POA: Diagnosis not present

## 2024-03-26 DIAGNOSIS — E1169 Type 2 diabetes mellitus with other specified complication: Secondary | ICD-10-CM | POA: Diagnosis not present

## 2024-03-26 DIAGNOSIS — E1121 Type 2 diabetes mellitus with diabetic nephropathy: Secondary | ICD-10-CM | POA: Diagnosis not present

## 2024-03-26 MED ORDER — METFORMIN HCL ER 500 MG PO TB24: 500.0000 mg | ORAL_TABLET | Freq: Every day | ORAL | 1 refills | Status: AC

## 2024-03-26 MED ORDER — LINAGLIPTIN 5 MG PO TABS: 5.0000 mg | ORAL_TABLET | Freq: Every day | ORAL | 3 refills | Status: AC

## 2024-03-26 MED ORDER — TELMISARTAN 40 MG PO TABS: 40.0000 mg | ORAL_TABLET | Freq: Every day | ORAL | 3 refills | Status: AC

## 2024-03-26 MED ORDER — GABAPENTIN 100 MG PO CAPS: 100.0000 mg | ORAL_CAPSULE | Freq: Two times a day (BID) | ORAL | 4 refills | Status: DC

## 2024-03-26 NOTE — Assessment & Plan Note (Signed)
 Continue gabapentin  100 mg twice a day.  PDMP reviewed.  Refill sent.  Patient counseled to call pharmacy as he gets closer to needing refill.  Continue lidocaine  5%, 1 patch every 12 hourly as needed for back pain.  Continue follow-up with pain clinic.  Discussed realistic goal of pain control without use of narcotics at this time.  Patient also counseled on increasing mobility, using heating pad to help with pain.

## 2024-03-26 NOTE — Assessment & Plan Note (Signed)
 Continue follow-up with pain clinic. Do not recommend increasing the dose of gabapentin  at this time.  Continue gabapentin  100 mg twice a day.  PDMP reviewed.

## 2024-03-26 NOTE — Progress Notes (Signed)
 Established Patient Office Visit   Subjective  Patient ID: Justin Patel, male    DOB: December 05, 1950  Age: 73 y.o. MRN: [id number]  Chief Complaint  Patient presents with   Diabetes   Gastroesophageal Reflux    He  has a past medical history of Acute cough (12/22/2022), Acute renal failure superimposed on stage 3a chronic kidney disease (HCC) (05/08/2018), Anemia, Anxiety, Arthritis, Cancer (HCC) (02/01/2016), CHF (congestive heart failure) (HCC), Chronic abdominal pain (02/27/2019), Chronic kidney disease, Degenerative disc disease, Depression, Diabetes mellitus, Diarrhea (07/22/2023), DJD (degenerative joint disease), Dysplastic nevus (02/04/2016), Dyspnea on exertion (08/24/2018), Family history of adverse reaction to anesthesia, Foot fracture (02/02/2022), Foot fracture, left (01/2022), Fracture of L1 vertebra (HCC) (01/14/2020), Gastric ulcer, GERD (gastroesophageal reflux disease), Grief (03/15/2023), H/O hiatal hernia, Headache(784.0), Heart murmur, 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), History of gastric bypass, congestive heart failure, Hyperlipidemia, Hypertension, Intertrigo (02/24/2017), Iron  deficiency anemia (02/28/2015), Itchy skin (12/04/2019), Lumbar scoliosis, Numbness of face (05/22/2019), Opiate abuse, continuous (HCC) (06/20/2015), Orthostatic hypotension (09/08/2023), Otitis media (09/07/2023), Pneumonia (11/20/2022), Rash (03/11/2017), RLQ abdominal pain (05/18/2018), S/P laparoscopic cholecystectomy (02/18/2020), Sacral fracture, closed (HCC), Seizures (HCC), Sleep apnea, Sore throat (03/15/2023), Stones in the urinary tract, Syncope and collapse, Transaminitis (09/07/2023), Tylenol  overdose, accidental or unintentional, initial encounter (09/07/2023), Vertigo (09/27/2023), and Vitamin D  deficiency (07/20/2017).  HPI Discussed the use of AI scribe software for clinical note transcription with the patient, who gave verbal consent to proceed.  History of Present  Illness Justin Patel is a 73 year old male who presents for follow-up after recent hospitalization and chronic medication management.   Hospitalization from 02/29/2024 to 03/02/2024 for acute toxic encephalopathy: This was suspected secondary to polypharmacy, although opiate overdose and hypotension.  During hospital discharge patient's gabapentin  dosage was reduced to 100 mg twice a day.  He is currently on buprenorphine patch.  He continues to follow-up with pain clinic.  He is also on lidocaine  5% patch.  Continues to have back pain, shoulder pain, neuropathy symptoms involving lower legs.    Type 2 diabetes with diabetic nephropathy:  he has a history of diabetes and is taking linagliptin  5 mg daily. He did not start Rybelsus  due to concerns about side effects, including thyroid  cancer. His hemoglobin A1c has increased from 5.8% to 7% over the past year. He is considering dietary changes to manage his blood sugar levels. He previously took metformin  but experienced diarrhea, a common side effect, and is considering restarting it with food to mitigate this issue.  Has a history of side effect to SGLT2 in the past.  Patient has seen nephrology in the past but has not been following up with them.  He has tried metformin  in the past but developed diarrhea on it so has not continued to take metformin .  He has a history of heart care and is considering changing his blood pressure medication from valsartan  to telmisartan  for longer-lasting effects.  ROS As per HPI    Objective:     BP 122/70 (BP Location: Left Arm, Patient Position: Sitting, Cuff Size: Normal)   Pulse 86   Temp 98.5 F (36.9 C) (Oral)   Ht 5' 7 (1.702 m)   Wt 159 lb 9.6 oz (72.4 kg)   SpO2 97%   BMI 25.00 kg/m      03/26/2024    2:03 PM 02/07/2024    2:04 PM 11/08/2023    3:08 PM  Depression screen PHQ 2/9  Decreased Interest 0 1 2  Down,  Depressed, Hopeless 0 0 1  PHQ - 2 Score 0 1 3  Altered sleeping 0 1 0  Tired,  decreased energy 0 1 1  Change in appetite 0 0 0  Feeling bad or failure about yourself  0 0 0  Trouble concentrating 0 0 0  Moving slowly or fidgety/restless 0 0 0  Suicidal thoughts 0 0 0  PHQ-9 Score 0 3 4  Difficult doing work/chores Not difficult at all Not difficult at all Not difficult at all      03/26/2024    2:03 PM 02/07/2024    2:04 PM 11/08/2023    3:08 PM 09/15/2023   10:04 AM  GAD 7 : Generalized Anxiety Score  Nervous, Anxious, on Edge 0 0 0 0  Control/stop worrying 0 0 0 0  Worry too much - different things 0 0 0 0  Trouble relaxing 0 0 0 0  Restless 0 0 0 0  Easily annoyed or irritable 0 0 0 0  Afraid - awful might happen 0 0 0 0  Total GAD 7 Score 0 0 0 0  Anxiety Difficulty Not difficult at all Not difficult at all Not difficult at all Not difficult at all      03/26/2024    2:03 PM 02/07/2024    2:04 PM 11/08/2023    3:08 PM  Depression screen PHQ 2/9  Decreased Interest 0 1 2  Down, Depressed, Hopeless 0 0 1  PHQ - 2 Score 0 1 3  Altered sleeping 0 1 0  Tired, decreased energy 0 1 1  Change in appetite 0 0 0  Feeling bad or failure about yourself  0 0 0  Trouble concentrating 0 0 0  Moving slowly or fidgety/restless 0 0 0  Suicidal thoughts 0 0 0  PHQ-9 Score 0 3 4  Difficult doing work/chores Not difficult at all Not difficult at all Not difficult at all      03/26/2024    2:03 PM 02/07/2024    2:04 PM 11/08/2023    3:08 PM 09/15/2023   10:04 AM  GAD 7 : Generalized Anxiety Score  Nervous, Anxious, on Edge 0 0 0 0  Control/stop worrying 0 0 0 0  Worry too much - different things 0 0 0 0  Trouble relaxing 0 0 0 0  Restless 0 0 0 0  Easily annoyed or irritable 0 0 0 0  Afraid - awful might happen 0 0 0 0  Total GAD 7 Score 0 0 0 0  Anxiety Difficulty Not difficult at all Not difficult at all Not difficult at all Not difficult at all   SDOH Screenings   Food Insecurity: No Food Insecurity (02/29/2024)  Housing: Low Risk  (02/29/2024)  Transportation  Needs: No Transportation Needs (02/29/2024)  Utilities: Not At Risk (02/29/2024)  Alcohol Screen: Low Risk  (07/13/2023)  Depression (PHQ2-9): Low Risk  (03/26/2024)  Financial Resource Strain: Low Risk  (07/13/2023)  Physical Activity: Inactive (07/13/2023)  Social Connections: Socially Integrated (02/29/2024)  Stress: No Stress Concern Present (07/13/2023)  Tobacco Use: Low Risk  (03/26/2024)  Health Literacy: Adequate Health Literacy (07/13/2023)     Physical Exam Constitutional:      General: He is not in acute distress.    Appearance: He is normal weight.  HENT:     Head: Normocephalic and atraumatic.     Mouth/Throat:     Mouth: Mucous membranes are moist.  Cardiovascular:     Rate and Rhythm: Normal  rate.  Pulmonary:     Breath sounds: No wheezing or rales.  Abdominal:     General: Bowel sounds are normal.     Palpations: Abdomen is soft.     Tenderness: There is no abdominal tenderness. There is no guarding.  Musculoskeletal:     Cervical back: Normal range of motion and neck supple.     Right lower leg: No edema.     Left lower leg: No edema.  Skin:    General: Skin is warm.  Neurological:     Mental Status: He is alert and oriented to person, place, and time.  Psychiatric:        Mood and Affect: Mood normal.        No results found for any visits on 03/26/24.  The 10-year ASCVD risk score (Arnett DK, et al., 2019) is: 33.9%    Following results discussed:  Component     Latest Ref Rng 02/07/2024  Cholesterol     0 - 200 mg/dL 849   Triglycerides     0.0 - 149.0 mg/dL 894.9   HDL Cholesterol     >39.00 mg/dL 36.69   VLDL     0.0 - 40.0 mg/dL 78.9   LDL (calc)     0 - 99 mg/dL 66   Total CHOL/HDL Ratio 2   NonHDL 86.70   Microalb, Ur     0.0 - 1.9 mg/dL 860.5 (H)   Creatinine,U     mg/dL 840.5   MICROALB/CREAT RATIO     0.0 - 30.0 mg/g 874.3 (H)   Vitamin E (Alpha Tocopherol)     5.7 - 19.9 mg/L 11.4   Gamma-Tocopherol (Vit E)     <4.4 mg/L <1.0    Hemoglobin A1C     4.6 - 6.5 % 7.0 (H)   SELENIUM, BLOOD     63 - 160 mcg/L 104    Assessment & Plan:   Type 2 diabetes mellitus with other specified complication, without long-term current use of insulin  (HCC) Assessment & Plan: Add metformin  500 mg daily with breakfast to help improve blood glucose as A1c from July shows uptrending.  If side effect including diarrhea, bloating patient will reach out to our clinic.  Continue linagliptin  5 mg once daily.  Orders: -     linaGLIPtin ; Take 1 tablet (5 mg total) by mouth daily.  Dispense: 90 tablet; Refill: 3 -     metFORMIN  HCl ER; Take 1 tablet (500 mg total) by mouth daily with breakfast.  Dispense: 90 tablet; Refill: 1 -     Hemoglobin A1c; Future -     Comprehensive metabolic panel with GFR; Future  Type II diabetes mellitus with nephropathy (HCC) Assessment & Plan: Add metformin  500 mg daily with breakfast to help improve blood glucose as A1c from July shows uptrending.  If side effect including diarrhea, bloating patient will reach out to our clinic.  Continue linagliptin  5 mg once daily.  Orders: -     Gabapentin ; Take 1 capsule (100 mg total) by mouth 2 (two) times daily.  Dispense: 60 capsule; Refill: 4 -     Microalbumin / creatinine urine ratio; Future  Essential hypertension, benign -     Telmisartan ; Take 1 tablet (40 mg total) by mouth daily.  Dispense: 90 tablet; Refill: 3 -     Comprehensive metabolic panel with GFR; Future  Chronic kidney disease due to type 2 diabetes mellitus (HCC) Assessment & Plan: Patient declined treatment with SGLT2, GLP-1  group medication to help with proteinuria. Recommend changing antihypertensive from valsartan  to telmisartan  for longer coverage.  Start telmisartan  40 mg once patient runs out of current prescription for valsartan .  Patient verbalizes understanding.  Also counseled on checking blood pressure at home.  Repeat urine microalbumin, A1c, CMP in 3 months.   Chronic pain  syndrome Assessment & Plan: Continue follow-up with pain clinic. Do not recommend increasing the dose of gabapentin  at this time.  Continue gabapentin  100 mg twice a day.  PDMP reviewed.   Lumbar radiculopathy Assessment & Plan: Continue gabapentin  100 mg twice a day.  PDMP reviewed.  Refill sent.  Patient counseled to call pharmacy as he gets closer to needing refill.  Continue lidocaine  5%, 1 patch every 12 hourly as needed for back pain.  Continue follow-up with pain clinic.  Discussed realistic goal of pain control without use of narcotics at this time.  Patient also counseled on increasing mobility, using heating pad to help with pain.      Return in about 3 months (around 06/26/2024).   Luke Shade, MD

## 2024-03-26 NOTE — Patient Instructions (Addendum)
-   Please finish Losartan  at home. Once finished please start taking Telmisartan  40 mg once daily to help with high blood pressure and kidney protection.   - I am starting you on Metformin  500 mg, please take this with food to reduce upset stomach. Make sure you stay hydrated. I recommend we repeat your labs in 3 months. If your kidney function worsens, increased in protein in urine, I recommend referral to kidney doctor or a nephrologist.   - Continue Gabapentin  100 mg twice a day, continue lidocaine  patch, 1 patch to back every 12 hourly, continue follow up with pain clinic.

## 2024-03-26 NOTE — Assessment & Plan Note (Signed)
 Patient declined treatment with SGLT2, GLP-1 group medication to help with proteinuria. Recommend changing antihypertensive from valsartan  to telmisartan  for longer coverage.  Start telmisartan  40 mg once patient runs out of current prescription for valsartan .  Patient verbalizes understanding.  Also counseled on checking blood pressure at home.  Repeat urine microalbumin, A1c, CMP in 3 months.

## 2024-03-26 NOTE — Assessment & Plan Note (Signed)
 Add metformin  500 mg daily with breakfast to help improve blood glucose as A1c from July shows uptrending.  If side effect including diarrhea, bloating patient will reach out to our clinic.  Continue linagliptin  5 mg once daily.

## 2024-04-10 ENCOUNTER — Inpatient Hospital Stay: Attending: Internal Medicine

## 2024-04-10 ENCOUNTER — Inpatient Hospital Stay

## 2024-04-10 ENCOUNTER — Encounter: Payer: Self-pay | Admitting: Internal Medicine

## 2024-04-10 ENCOUNTER — Inpatient Hospital Stay: Admitting: Internal Medicine

## 2024-04-10 VITALS — BP 150/89 | HR 68

## 2024-04-10 VITALS — BP 126/75 | HR 80 | Temp 97.7°F | Resp 16 | Wt 161.0 lb

## 2024-04-10 DIAGNOSIS — M810 Age-related osteoporosis without current pathological fracture: Secondary | ICD-10-CM | POA: Insufficient documentation

## 2024-04-10 DIAGNOSIS — E611 Iron deficiency: Secondary | ICD-10-CM | POA: Insufficient documentation

## 2024-04-10 DIAGNOSIS — E538 Deficiency of other specified B group vitamins: Secondary | ICD-10-CM | POA: Diagnosis not present

## 2024-04-10 DIAGNOSIS — D649 Anemia, unspecified: Secondary | ICD-10-CM | POA: Diagnosis not present

## 2024-04-10 LAB — CBC WITH DIFFERENTIAL (CANCER CENTER ONLY)
Abs Immature Granulocytes: 0.03 K/uL (ref 0.00–0.07)
Basophils Absolute: 0 K/uL (ref 0.0–0.1)
Basophils Relative: 1 %
Eosinophils Absolute: 0.1 K/uL (ref 0.0–0.5)
Eosinophils Relative: 2 %
HCT: 38.5 % — ABNORMAL LOW (ref 39.0–52.0)
Hemoglobin: 12.8 g/dL — ABNORMAL LOW (ref 13.0–17.0)
Immature Granulocytes: 0 %
Lymphocytes Relative: 17 %
Lymphs Abs: 1.3 K/uL (ref 0.7–4.0)
MCH: 34.8 pg — ABNORMAL HIGH (ref 26.0–34.0)
MCHC: 33.2 g/dL (ref 30.0–36.0)
MCV: 104.6 fL — ABNORMAL HIGH (ref 80.0–100.0)
Monocytes Absolute: 0.5 K/uL (ref 0.1–1.0)
Monocytes Relative: 6 %
Neutro Abs: 5.7 K/uL (ref 1.7–7.7)
Neutrophils Relative %: 74 %
Platelet Count: 190 K/uL (ref 150–400)
RBC: 3.68 MIL/uL — ABNORMAL LOW (ref 4.22–5.81)
RDW: 13.7 % (ref 11.5–15.5)
WBC Count: 7.7 K/uL (ref 4.0–10.5)
nRBC: 0 % (ref 0.0–0.2)

## 2024-04-10 LAB — BASIC METABOLIC PANEL WITH GFR
Anion gap: 7 (ref 5–15)
BUN: 13 mg/dL (ref 8–23)
CO2: 17 mmol/L — ABNORMAL LOW (ref 22–32)
Calcium: 8.5 mg/dL — ABNORMAL LOW (ref 8.9–10.3)
Chloride: 108 mmol/L (ref 98–111)
Creatinine, Ser: 1.12 mg/dL (ref 0.61–1.24)
GFR, Estimated: >60 mL/min (ref >60)
Glucose, Bld: 89 mg/dL (ref 70–99)
Potassium: 4.2 mmol/L (ref 3.5–5.1)
Sodium: 132 mmol/L — ABNORMAL LOW (ref 135–145)

## 2024-04-10 LAB — VITAMIN B12: Vitamin B-12: 665 pg/mL (ref 180–914)

## 2024-04-10 LAB — VITAMIN D 25 HYDROXY (VIT D DEFICIENCY, FRACTURES): Vit D, 25-Hydroxy: 43.2 ng/mL (ref 30–100)

## 2024-04-10 MED ORDER — CYANOCOBALAMIN 1000 MCG/ML IJ SOLN
1000.0000 ug | Freq: Once | INTRAMUSCULAR | Status: AC
  Administered 2024-04-10: 1000 ug via INTRAMUSCULAR
  Filled 2024-04-10: qty 1

## 2024-04-10 MED ORDER — SODIUM CHLORIDE 0.9% FLUSH
10.0000 mL | Freq: Once | INTRAVENOUS | Status: AC | PRN
  Administered 2024-04-10: 10 mL
  Filled 2024-04-10: qty 10

## 2024-04-10 MED ORDER — IRON SUCROSE 20 MG/ML IV SOLN
200.0000 mg | Freq: Once | INTRAVENOUS | Status: AC
  Administered 2024-04-10: 200 mg via INTRAVENOUS
  Filled 2024-04-10: qty 10

## 2024-04-10 NOTE — Progress Notes (Signed)
 [facility] CONSULT NOTE  Patient Care Team: Bair, Kalpana, MD as PCP - General (Family Medicine) Darron Deatrice LABOR, MD as PCP - Cardiology (Cardiology) Skeet Juliene SAUNDERS, DO as Consulting Physician (Neurology) Rennie Cindy SAUNDERS, MD as Consulting Physician (Oncology) Homsher, Wanda, RN as VBCI Care Management  CHIEF COMPLAINTS/PURPOSE OF CONSULTATION: ANEMIA   HEMATOLOGY HISTORY  # ANEMIA [Hb; MCV-platelets- WBC; Iron  sat; ferritin;  GFR- CT/US ; FEB 2023- EGD [[facility]]/- Evidence of a Roux-en-Y gastrojejunostomy was found. The gastrojejunal anastomosis was characterized by healthy appearing mucosa. The jejunojejunal anastomosis was characterized by healthy appearing mucosa. Previous gastric ulcer has healed. 2019-Colonoscopy   Latest Reference Range & Units 01/05/22 11:13  TIBC 250.0 - 450.0 mcg/dL 466.5 (H)  Saturation Ratios 20.0 - 50.0 % 10.3 (L)  Ferritin 22.0 - 322.0 ng/mL 12.5 (L)  Transferrin 212.0 - 360.0 mg/dL 618.9 (H)  (H): Data is abnormally high (L): Data is abnormally low  # Leucocytosis/weight loss/extreme fatigue-bone marrow biopsy-June 2019-unremarkable.   #Mild macrocytosis without anemia-question copper  deficiency/gastric bypass; recommend p.o. copper .   #  SDH [[place]- 2015]; gastric Bypass Surgery    Latest Reference Range & Units 01/05/22 11:13  TIBC 250.0 - 450.0 mcg/dL 466.5 (H)  Saturation Ratios 20.0 - 50.0 % 10.3 (L)  Ferritin 22.0 - 322.0 ng/mL 12.5 (L)  Transferrin 212.0 - 360.0 mg/dL 618.9 (H)  (H): Data is abnormally high (L): Data is abnormally low   No history exists.    Latest Reference Range & Units 01/05/22 11:13  WBC 4.0 - 10.5 K/uL 8.4  RBC 4.22 - 5.81 Mil/uL 4.19 (L)  Hemoglobin 13.0 - 17.0 g/dL 87.6 (L)  HCT 60.9 - 47.9 % 38.3 (L)  MCV 78.0 - 100.0 fl 91.4  MCHC 30.0 - 36.0 g/dL 67.9  RDW 88.4 - 84.4 % 16.7 (H)  Platelets 150.0 - 400.0 K/uL 207.0  (L): Data is abnormally low (H): Data is abnormally  high  HISTORY OF PRESENTING ILLNESS: Patient ambulating independently.  Alone.   Justin Patel 73 y.o.  male pleasant patient with a history of gastric bypass -anemia; chronic back pain s/p multiple surgeries osteoporosis is here for follow-up.  atient is here for follow up; Patient states when he stands his BP drops. Patient has had to go to ED due to falls   Patient complains of on going fatigue. Chronic back pain s/p surgery-  [place] neurosurgery, Takes hydrocodone  for chronic back pain. Pt in a pain clinic.   Pt feels fatigued.  Denies any blood in stools or black or stools.   Review of Systems  Constitutional:  Positive for malaise/fatigue. Negative for chills, diaphoresis, fever and weight loss.  HENT:  Negative for nosebleeds and sore throat.   Eyes:  Negative for double vision.  Respiratory:  Negative for cough, hemoptysis, sputum production, shortness of breath and wheezing.   Cardiovascular:  Negative for chest pain, palpitations, orthopnea and leg swelling.  Gastrointestinal:  Negative for abdominal pain, blood in stool, constipation, diarrhea, heartburn, melena, nausea and vomiting.  Genitourinary:  Negative for dysuria, frequency and urgency.  Musculoskeletal:  Positive for back pain and joint pain.  Skin: Negative.  Negative for itching and rash.  Neurological:  Negative for dizziness, tingling, focal weakness, weakness and headaches.  Endo/Heme/Allergies:  Does not bruise/bleed easily.  Psychiatric/Behavioral:  Negative for depression. The patient is not nervous/anxious and does not have insomnia.      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  removal scheduled to clear margins.    CHF (congestive heart failure) (HCC)    no longer after weight loss   Chronic abdominal pain 02/27/2019   Chronic kidney disease    cysts   Degenerative disc disease    Depression    Diabetes mellitus    no longer diabetic-or on meds   Diarrhea 07/22/2023   DJD (degenerative joint disease)    Dysplastic nevus 02/04/2016   R upper back paraspinal - severe   Dyspnea on exertion 08/24/2018   Family history of adverse reaction to anesthesia    sons wake up combative   Foot fracture 02/02/2022   Foot fracture, left 01/2022   Fracture of L1 vertebra (HCC) 01/14/2020   Gastric ulcer    GERD (gastroesophageal reflux disease)    Grief 03/15/2023   H/O hiatal hernia    Headache(784.0)    sinus   Heart murmur    History of fusion of thoracic spine 12/28/2021   History of gastric bypass    Hx of congestive heart failure    Hyperlipidemia    Hypertension    Intertrigo 02/24/2017   Iron  deficiency anemia 02/28/2015   Itchy skin 12/04/2019   Lumbar scoliosis    Numbness of face 05/22/2019   Opiate abuse, continuous (HCC) 06/20/2015   Orthostatic hypotension 09/08/2023   Otitis media 09/07/2023   Pneumonia 11/20/2022   Rash 03/11/2017   RLQ abdominal pain 05/18/2018   S/P laparoscopic cholecystectomy 02/18/2020   Sacral fracture, closed (HCC)    Seizures (HCC)    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, after GI bleed   Sleep apnea    hx not now since wt loss   Sore throat 03/15/2023   Stones in the urinary tract    Syncope and collapse    Transaminitis 09/07/2023   Tylenol  overdose, accidental or unintentional, initial encounter 09/07/2023   Vertigo 09/27/2023   Vitamin D  deficiency 07/20/2017    SURGICAL HISTORY: Past Surgical History:  Procedure Laterality Date   ANTERIOR CERVICAL DECOMP/DISCECTOMY FUSION  01/26/2012   Procedure: ANTERIOR CERVICAL DECOMPRESSION/DISCECTOMY FUSION 3 LEVELS;  Surgeon: Catalina CHRISTELLA Stains, MD;  Location: [facility];  Service: Neurosurgery;   Laterality: N/A;  Cervical three-four Cervical four-five Cervical five-six Cervical six-seven , Anterior cervical decompression/diskectomy, fusion, plate   ANTERIOR LAT LUMBAR FUSION Right 04/25/2018   Procedure: Right Lumbar two-three Lumbar three-four Lumbar four-five anterior  lateral interbody fusion  with posterior percutaneous pedicle screws;  Surgeon: Unice Pac, MD;  Location: [facility] OR;  Service: Neurosurgery;  Laterality: Right;   APPENDECTOMY     CARDIAC CATHETERIZATION     [facility], normal   CARDIAC CATHETERIZATION     [place]   CHOLECYSTECTOMY     COLONOSCOPY WITH PROPOFOL  N/A 12/27/2017   Procedure: COLONOSCOPY WITH PROPOFOL ;  Surgeon: Toledo, Ladell POUR, MD;  Location: [facility];  Service: Gastroenterology;  Laterality: N/A;   ESOPHAGOGASTRODUODENOSCOPY (EGD) WITH PROPOFOL  N/A 12/27/2017   Procedure: ESOPHAGOGASTRODUODENOSCOPY (EGD) WITH PROPOFOL ;  Surgeon: Toledo, Ladell POUR, MD;  Location: [facility] END[facility]COPY;  Service: Gastroenterology;  Laterality: N/A;   ESOPHAGOGASTRODUODENOSCOPY (EGD) WITH PROPOFOL  N/A 06/19/2021   Procedure: ESOPHAGOGASTRODUODENOSCOPY (EGD) WITH PROPOFOL ;  Surgeon: Maryruth Ole DASEN, MD;  Location: [facility] END[facility]COPY;  Service: Gastroenterology;  Laterality: N/A;   ESOPHAGOGASTRODUODENOSCOPY (EGD) WITH PROPOFOL  N/A 09/25/2021   Procedure: ESOPHAGOGASTRODUODENOSCOPY (EGD) WITH PROPOFOL ;  Surgeon: Maryruth Ole DASEN, MD;  Location: [facility] ENDOSCOPY;  Service: Endoscopy;  Laterality: N/A;   GASTRIC BYPASS  08/09/2008   [employer] [facility]   HARDWARE REMOVAL N/A 03/08/2022   Procedure: Removal of bilateral Thoracic ten pedicle screws;  Surgeon: Louis Shove, MD;  Location: [facility];  Service: Neurosurgery;  Laterality: N/A;   HERNIA REPAIR  08/09/1998   hiatal   JOINT REPLACEMENT     KNEE ARTHROSCOPY     bilateral, left x 2   LAMINECTOMY WITH POSTERIOR LATERAL ARTHRODESIS LEVEL 4 N/A 01/14/2020   Procedure: Decompressive Laminectomy Lumbar One with pedicle  screw fixation from Thoracic Ten to Lumbar Two;  Surgeon: Unice Pac, MD;  Location: [facility] OR;  Service: Neurosurgery;  Laterality: N/A;  Decompressive Laminectomy Lumbar One with pedicle screw fixation from Thoracic Ten to Lumbar Two   LUMBAR PERCUTANEOUS PEDICLE SCREW 3 LEVEL N/A 04/25/2018   Procedure: LUMBAR PERCUTANEOUS PEDICLE SCREW 3 LEVEL;  Surgeon: Unice Pac, MD;  Location: [facility];  Service: Neurosurgery;  Laterality: N/A;   NASAL SINUS SURGERY     x5   OSTEOTOMY  08/10/1999   left   ROUX-EN-Y GASTRIC BYPASS     THORACIC LAMINECTOMY FOR EPIDURAL ABSCESS N/A 03/28/2022   Procedure: THORACIC WOUND WASHOUT;  Surgeon: Joshua Alm RAMAN, MD;  Location: [facility] OR;  Service: Neurosurgery;  Laterality: N/A;   TONSILLECTOMY     TOTAL KNEE ARTHROPLASTY Left 05/09/2014   Dr. Mardee   UVULOPALATOPLASTY  08/09/2009    SOCIAL HISTORY: Social History   Socioeconomic History   Marital status: Married    Spouse name: Not on file   Number of children: 2   Years of education: Not on file   Highest education level: Not on file  Occupational History   Not on file  Tobacco Use   Smoking status: Never   Smokeless tobacco: Never  Vaping Use   Vaping status: Never Used  Substance and Sexual Activity   Alcohol use: Yes    Alcohol/week: 21.0 standard drinks of alcohol    Types: 21 Glasses of wine per week    Comment: occassionally   Drug use: No   Sexual activity: Yes    Partners: Female  Other Topics Concern   Not on file  Social History Narrative   Lives in [place] with wife, [place]. 2 sons, Beverley FOLKS, Franky 30.. 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      Diet - regular diet, limited quantities after gastric bypass      Exercise - no regular, limited by arthritis in knees, occasional water aerobics   Right handed   One story house   Social Drivers of Health   Financial Resource Strain: Low Risk  (07/13/2023)   Overall Financial  Resource Strain (CARDIA)    Difficulty of Paying Living Expenses: Not hard at all  Food Insecurity: No Food Insecurity (02/29/2024)   Hunger Vital Sign    Worried About Running Out of Food in the Last Year: Never true    Ran Out of Food in the Last Year: Never true  Transportation Needs: No Transportation Needs (02/29/2024)   PRAPARE - Administrator, Civil Service (Medical): No    Lack of Transportation (Non-Medical): No  Physical Activity: Inactive (07/13/2023)   Exercise Vital  Sign    Days of Exercise per Week: 0 days    Minutes of Exercise per Session: 0 min  Stress: No Stress Concern Present (07/13/2023)   [employer] of Occupational Health - Occupational Stress Questionnaire    Feeling of Stress : Only a little  Social Connections: Socially Integrated (02/29/2024)   Social Connection and Isolation Panel    Frequency of Communication with Friends and Family: Twice a week    Frequency of Social Gatherings with Friends and Family: More than three times a week    Attends Religious Services: More than 4 times per year    Active Member of [employer] or Organizations: Yes    Attends Engineer, structural: More than 4 times per year    Marital Status: Married  Catering manager Violence: Not At Risk (02/29/2024)   Humiliation, Afraid, Rape, and Kick questionnaire    Fear of Current or Ex-Partner: No    Emotionally Abused: No    Physically Abused: No    Sexually Abused: No    FAMILY HISTORY: Family History  Problem Relation Age of Onset   Dementia Mother 20   Osteoporosis Mother    Heart disease Father 57   Diabetes Father    Lymphoma Sister        lymphoma, stage 4   Ovarian cancer Sister 12       Ovarian   Lupus Sister    Prostate cancer Maternal Uncle    Skin cancer Maternal Uncle    Tongue cancer Maternal Uncle        uncle died of heart attack   Alcoholism Maternal Uncle    Lung cancer Paternal Aunt        pat aunts x 4 died of lung cancer   Lung  cancer Paternal Aunt    Lung cancer Paternal Aunt    Lung cancer Paternal Aunt    Mesothelioma Cousin        paternal cousin    ALLERGIES:  is allergic to altace [ramipril], ace inhibitors, levaquin [levofloxacin in d5w], lyrica [pregabalin], metformin , and trulicity  [dulaglutide ].  MEDICATIONS:  Current Outpatient Medications  Medication Sig Dispense Refill   buprenorphine (BUTRANS) 10 MCG/HR PTWK 1 patch once a week.     Calcium  Carbonate-Vitamin D  (CALCIUM  600/VITAMIN D  PO) Take 1 tablet by mouth daily after supper.     Cholecalciferol  50 MCG (2000 UT) TABS Take 2,000 Units by mouth daily after supper.     esomeprazole  (NEXIUM ) 40 MG capsule Take 1 capsule (40 mg total) by mouth daily. 180 capsule 1   ezetimibe  (ZETIA ) 10 MG tablet Take 1 tablet (10 mg total) by mouth daily. 90 tablet 3   finasteride  (PROSCAR ) 5 MG tablet Take 1 tablet (5 mg total) by mouth daily. 90 tablet 1   fluticasone  (FLONASE ) 50 MCG/ACT nasal spray Place 2 sprays into both nostrils daily. 16 g 6   gabapentin  (NEURONTIN ) 100 MG capsule Take 1 capsule (100 mg total) by mouth 2 (two) times daily. 60 capsule 4   linagliptin  (TRADJENTA ) 5 MG TABS tablet Take 1 tablet (5 mg total) by mouth daily. 90 tablet 3   metFORMIN  (GLUCOPHAGE -XR) 500 MG 24 hr tablet Take 1 tablet (500 mg total) by mouth daily with breakfast. 90 tablet 1   metroNIDAZOLE  (METROGEL ) 0.75 % gel Apply 1 Application topically at bedtime. qhs to face for Rosacea 45 g 11   Multiple Vitamin (MULTIVITAMIN) capsule Take 1 capsule by mouth daily. With copper  and zinc   primidone  (MYSOLINE ) 50 MG tablet Take 1 tablet (50 mg total) by mouth at bedtime. 30 tablet 5   rosuvastatin  (CRESTOR ) 40 MG tablet Take 1 tablet (40 mg total) by mouth at bedtime. 90 tablet 3   sucralfate  (CARAFATE ) 1 g tablet Take 1 tablet (1 g total) by mouth 4 (four) times daily. 360 tablet 1   telmisartan  (MICARDIS ) 40 MG tablet Take 1 tablet (40 mg total) by mouth daily. 90 tablet 3    venlafaxine  XR (EFFEXOR -XR) 75 MG 24 hr capsule Take 1 capsule (75 mg total) by mouth daily. 90 capsule 3   buprenorphine (BUTRANS) 5 MCG/HR PTWK 1 patch once a week. (Patient not taking: Reported on 04/10/2024)     No current facility-administered medications for this visit.   Facility-Administered Medications Ordered in Other Visits  Medication Dose Route Frequency Provider Last Rate Last Admin   cyanocobalamin  (VITAMIN B12) injection 1,000 mcg  1,000 mcg Intramuscular Once Davinity Fanara R, MD       iron  sucrose (VENOFER ) injection 200 mg  200 mg Intravenous Once Jackelin Correia R, MD       sodium chloride  flush (NS) 0.9 % injection 10 mL  10 mL Intracatheter Once PRN Heydy Montilla R, MD         .  PHYSICAL EXAMINATION:   Vitals:   04/10/24 1456  BP: 126/75  Pulse: 80  Resp: 16  Temp: 97.7 F (36.5 C)  SpO2: 100%    Filed Weights   04/10/24 1456  Weight: 161 lb (73 kg)     Physical Exam Vitals and nursing note reviewed.  HENT:     Head: Normocephalic and atraumatic.     Mouth/Throat:     Pharynx: Oropharynx is clear.  Eyes:     Extraocular Movements: Extraocular movements intact.     Pupils: Pupils are equal, round, and reactive to light.  Cardiovascular:     Rate and Rhythm: Normal rate and regular rhythm.  Pulmonary:     Comments: Decreased breath sounds bilaterally.  Abdominal:     Palpations: Abdomen is soft.  Musculoskeletal:        General: Normal range of motion.     Cervical back: Normal range of motion.  Skin:    General: Skin is warm.  Neurological:     General: No focal deficit present.     Mental Status: He is alert and oriented to person, place, and time.  Psychiatric:        Behavior: Behavior normal.        Judgment: Judgment normal.      LABORATORY DATA:  I have reviewed the data as listed Lab Results  Component Value Date   WBC 7.7 04/10/2024   HGB 12.8 (L) 04/10/2024   HCT 38.5 (L) 04/10/2024   MCV 104.6 (H)  04/10/2024   PLT 190 04/10/2024   Recent Labs    03/01/24 0950 03/02/24 0852 03/22/24 1539 04/10/24 1433  NA 141 142 136 132*  K 4.2 3.8 4.8 4.2  CL 114* 108 102 108  CO2 22 23 23  17*  GLUCOSE 125* 115* 125* 89  BUN 30* 16 13 13   CREATININE 1.32* 0.89 1.09 1.12  CALCIUM  7.4* 7.8* 9.1 8.5*  GFRNONAA 57* >60  --  >60  PROT 5.5* 5.9* 6.1  --   ALBUMIN  2.5* 2.7* 3.8  --   AST 23 30 37  --   ALT 62* 56* 45*  --   ALKPHOS 51 54 72  --  BILITOT 0.7 0.7 0.2  --      No results found.   ASSESSMENT & PLAN:   Iron  deficiency # Iron  deficiency: sec to gastric bypass. Given macrocytosis-  Question low-grade MDS-but bone marrow biopsy would be recommended to confirm.  As long patient does not have worsening anemia-I would recommend continued surveillance without a bone marrow.    # Currently on maintenance iron  infusion/ venofer .  Today hemoglobin is 12.3 .Proceed with Venofer  today.  # B 12 def [JUNE 2024-]- 233- s/p B12 shot- nov 2023; proceed with B12 shot every 3-4 months.  B12 shot today.  # Chronic back pain/osteoporosis [Dr.Poole;neurosurg]-status post surgery clinically stable.vit D NOV 2023- 43. Reclast  [[facility]; endocrinology]  # CKD stage- II- III [Dr.Singh]- stable.   # Hx-of Orthostatic Hypotension with recurrent fall [JAN 205]-does not wear compression stocking- use Dr.Arida  monitor for now.    # DISPOSITION:  # proceed with venofer  today; ok B12 shot injection # follow up in 4 months- MD; labs- cbc/bmp; vit D 25-OH; B12 levels-possible IV venofer ; B12 injection-Dr.B    All questions were answered. The patient knows to call the clinic with any problems, questions or concerns.    Cindy JONELLE Joe, MD 04/10/2024 3:37 PM

## 2024-04-10 NOTE — Progress Notes (Signed)
 Patient is here for follow up; Patient states when he stands his BP drops. Patient has had to go to ED due to falls.

## 2024-04-10 NOTE — Assessment & Plan Note (Addendum)
#   Iron  deficiency: sec to gastric bypass. Given macrocytosis-  Question low-grade MDS-but bone marrow biopsy would be recommended to confirm.  As long patient does not have worsening anemia-I would recommend continued surveillance without a bone marrow.    # Currently on maintenance iron  infusion/ venofer .  Today hemoglobin is 12.3 .Proceed with Venofer  today.  # B 12 def [JUNE 2024-]- 233- s/p B12 shot- nov 2023; proceed with B12 shot every 3-4 months.  B12 shot today.  # Chronic back pain/osteoporosis [Dr.Poole;neurosurg]-status post surgery clinically stable.vit D NOV 2023- 43. Reclast  [[facility]; endocrinology]  # CKD stage- II- III [Dr.Singh]- stable.   # Hx-of Orthostatic Hypotension with recurrent fall [JAN 205]-does not wear compression stocking- use Dr.Arida  monitor for now.    # DISPOSITION:  # proceed with venofer  today; ok B12 shot injection # follow up in 4 months- MD; labs- cbc/bmp; vit D 25-OH; B12 levels-possible IV venofer ; B12 injection-Dr.B

## 2024-04-21 ENCOUNTER — Other Ambulatory Visit: Payer: Self-pay | Admitting: Neurology

## 2024-04-24 DIAGNOSIS — M5416 Radiculopathy, lumbar region: Secondary | ICD-10-CM | POA: Diagnosis not present

## 2024-04-24 DIAGNOSIS — F1120 Opioid dependence, uncomplicated: Secondary | ICD-10-CM | POA: Diagnosis not present
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
## 2024-05-01 ENCOUNTER — Ambulatory Visit: Admitting: Neurology

## 2024-05-01 ENCOUNTER — Encounter: Payer: Self-pay | Admitting: Neurology

## 2024-05-01 VITALS — BP 100/60 | HR 47 | Ht 67.0 in | Wt 152.0 lb

## 2024-05-01 DIAGNOSIS — G250 Essential tremor: Secondary | ICD-10-CM | POA: Diagnosis not present

## 2024-05-01 NOTE — Patient Instructions (Signed)
primidone

## 2024-05-14 ENCOUNTER — Other Ambulatory Visit

## 2024-05-14 ENCOUNTER — Ambulatory Visit: Payer: Self-pay

## 2024-05-14 DIAGNOSIS — E1169 Type 2 diabetes mellitus with other specified complication: Secondary | ICD-10-CM | POA: Diagnosis not present

## 2024-05-14 LAB — HEMOGLOBIN A1C: Hgb A1c MFr Bld: 6.1 % (ref 4.6–6.5)

## 2024-05-21 ENCOUNTER — Other Ambulatory Visit: Payer: Self-pay | Admitting: Neurology

## 2024-06-22 DIAGNOSIS — F1120 Opioid dependence, uncomplicated: Secondary | ICD-10-CM | POA: Diagnosis not present

## 2024-06-22 DIAGNOSIS — M5416 Radiculopathy, lumbar region: Secondary | ICD-10-CM | POA: Diagnosis not present

## 2024-06-22 DIAGNOSIS — M4326 Fusion of spine, lumbar region: Secondary | ICD-10-CM | POA: Diagnosis not present

## 2024-06-22 DIAGNOSIS — M546 Pain in thoracic spine: Secondary | ICD-10-CM | POA: Diagnosis not present

## 2024-06-27 ENCOUNTER — Ambulatory Visit

## 2024-06-27 VITALS — BP 120/60 | HR 55 | Temp 99.0°F | Wt 163.6 lb

## 2024-06-27 DIAGNOSIS — E1121 Type 2 diabetes mellitus with diabetic nephropathy: Secondary | ICD-10-CM

## 2024-06-27 DIAGNOSIS — F39 Unspecified mood [affective] disorder: Secondary | ICD-10-CM

## 2024-06-27 DIAGNOSIS — E1169 Type 2 diabetes mellitus with other specified complication: Secondary | ICD-10-CM | POA: Diagnosis not present

## 2024-06-27 DIAGNOSIS — N401 Enlarged prostate with lower urinary tract symptoms: Secondary | ICD-10-CM

## 2024-06-27 DIAGNOSIS — N138 Other obstructive and reflux uropathy: Secondary | ICD-10-CM

## 2024-06-27 DIAGNOSIS — R001 Bradycardia, unspecified: Secondary | ICD-10-CM | POA: Diagnosis not present

## 2024-06-27 DIAGNOSIS — Z7984 Long term (current) use of oral hypoglycemic drugs: Secondary | ICD-10-CM

## 2024-06-27 DIAGNOSIS — E1122 Type 2 diabetes mellitus with diabetic chronic kidney disease: Secondary | ICD-10-CM | POA: Diagnosis not present

## 2024-06-27 MED ORDER — GABAPENTIN 300 MG PO CAPS
300.0000 mg | ORAL_CAPSULE | Freq: Two times a day (BID) | ORAL | 0 refills | Status: AC
Start: 2024-08-13 — End: ?

## 2024-06-27 NOTE — Progress Notes (Signed)
 Established Patient Office Visit   Subjective  Patient ID: Justin Patel, male    DOB: [birth year]  Age: 73 y.o. MRN: [id number]  Chief Complaint  Patient presents with   Diabetes   Hypertension    Discussed the use of AI scribe software for clinical note transcription with the patient, who gave verbal consent to proceed.  History of Present Illness Last OV on 03/26/24: Was recommended to start taking Metformin  500 mg in adjunct with Linagliptan 5 mg once daily due to up-trending A1c. Was 7% on 02/07/24, repeat A1c from 05/14/24 came down to 6.1%. He is happy with the result and reports of no s/e to the medication.   He has a history of microalbuminuria.  Dr. Dennise used to be nephrologist in the past but patient has not follows up with him.  Was started on Telmisartan  40 mg during his last visit. Management complicated due to history of yeast dermatitis when he was on SGLT-2.   He has a h/o BPH, has noted more need for straining, change in urinary frequency over the last few months. He is currently taking Finasteride  5 mg daily.   Has a h/o anemia and sees hematologist Dr. Rennie, last visit was on on 04/10/24. He receives iron  infusion, B12 intramuscular injection and is recommended to f/u with hematologist in 4 months.  Chronic pain syndrome, lumbar radiculopathy: He has been taking Gabapentin  300 mg twice a day. He is also on Buprenorphine 20 mcg/hr patch and follows up with pain clinic closely. He has noted improvement in back pain since starting the patch. He reports no issues with gabapentin , stating it does not make him sleepy or affect his thought process.  He has bradycardia. He saw cardiology in 03/2024. He is recommended to f/u with them in 6 months. No symptoms of dizziness, weakness, or fatigue, and he maintains sufficient energy for daily activities.  His medications includes: Buprenorphine 20 mcg/hr patch once weekly Calcium  and vitamin D  combination,  Prolia  injection  twice a year Nexium  40 mg daily Zetia  10 mg daily Finasteride  5 mg daily Flonase  as needed,  Gabapentin  300 mg twice a day Metformin  500 mg daily, Tradjenta  5 mg daily Primidone  50 mg at night, tremors has been stable and he f/u with neurologist.  Crestor  40 mg daily Carafix four times a day Telmisartan  40 mg daily Daily multivitamin  and Venlafaxine  75 mg daily.   ROS As per HPI    Objective:     BP 120/60 (BP Location: Left Arm, Patient Position: Sitting, Cuff Size: Normal)   Pulse (!) 55   Temp 99 F (37.2 C) (Oral)   Wt 163 lb 9.6 oz (74.2 kg)   SpO2 98%   BMI 25.62 kg/m      04/10/2024    2:56 PM 03/26/2024    2:03 PM 02/07/2024    2:04 PM  Depression screen PHQ 2/9  Decreased Interest 0 0 1  Down, Depressed, Hopeless 0 0 0  PHQ - 2 Score 0 0 1  Altered sleeping 0 0 1  Tired, decreased energy 0 0 1  Change in appetite 0 0 0  Feeling bad or failure about yourself  0 0 0  Trouble concentrating 0 0 0  Moving slowly or fidgety/restless 0 0 0  Suicidal thoughts 0 0 0  PHQ-9 Score 0  0  3   Difficult doing work/chores  Not difficult at all Not difficult at all     Data saved with a  previous flowsheet row definition      03/26/2024    2:03 PM 02/07/2024    2:04 PM 11/08/2023    3:08 PM 09/15/2023   10:04 AM  GAD 7 : Generalized Anxiety Score  Nervous, Anxious, on Edge 0 0 0 0  Control/stop worrying 0 0 0 0  Worry too much - different things 0 0 0 0  Trouble relaxing 0 0 0 0  Restless 0 0 0 0  Easily annoyed or irritable 0 0 0 0  Afraid - awful might happen 0 0 0 0  Total GAD 7 Score 0 0 0 0  Anxiety Difficulty Not difficult at all Not difficult at all Not difficult at all Not difficult at all      04/10/2024    2:56 PM 03/26/2024    2:03 PM 02/07/2024    2:04 PM  Depression screen PHQ 2/9  Decreased Interest 0 0 1  Down, Depressed, Hopeless 0 0 0  PHQ - 2 Score 0 0 1  Altered sleeping 0 0 1  Tired, decreased energy 0 0 1  Change in appetite 0 0 0  Feeling  bad or failure about yourself  0 0 0  Trouble concentrating 0 0 0  Moving slowly or fidgety/restless 0 0 0  Suicidal thoughts 0 0 0  PHQ-9 Score 0  0  3   Difficult doing work/chores  Not difficult at all Not difficult at all     Data saved with a previous flowsheet row definition      03/26/2024    2:03 PM 02/07/2024    2:04 PM 11/08/2023    3:08 PM 09/15/2023   10:04 AM  GAD 7 : Generalized Anxiety Score  Nervous, Anxious, on Edge 0 0 0 0  Control/stop worrying 0 0 0 0  Worry too much - different things 0 0 0 0  Trouble relaxing 0 0 0 0  Restless 0 0 0 0  Easily annoyed or irritable 0 0 0 0  Afraid - awful might happen 0 0 0 0  Total GAD 7 Score 0 0 0 0  Anxiety Difficulty Not difficult at all Not difficult at all Not difficult at all Not difficult at all   SDOH Screenings   Food Insecurity: No Food Insecurity (02/29/2024)  Housing: Low Risk  (02/29/2024)  Transportation Needs: No Transportation Needs (02/29/2024)  Utilities: Not At Risk (02/29/2024)  Alcohol Screen: Low Risk  (07/13/2023)  Depression (PHQ2-9): Low Risk  (04/10/2024)  Financial Resource Strain: Low Risk  (07/13/2023)  Physical Activity: Inactive (07/13/2023)  Social Connections: Socially Integrated (02/29/2024)  Stress: No Stress Concern Present (07/13/2023)  Tobacco Use: Low Risk  (06/27/2024)  Health Literacy: Adequate Health Literacy (07/13/2023)     Physical Exam Constitutional:      General: He is not in acute distress.    Appearance: He is not ill-appearing.  HENT:     Head: Normocephalic and atraumatic.     Mouth/Throat:     Mouth: Mucous membranes are moist.  Cardiovascular:     Rate and Rhythm: Bradycardia present.  Pulmonary:     Effort: Pulmonary effort is normal.     Breath sounds: Normal breath sounds.  Abdominal:     Palpations: Abdomen is soft.     Tenderness: There is no abdominal tenderness. There is no guarding.  Musculoskeletal:     Cervical back: Neck supple. No tenderness.     Right  lower leg: No edema.  Left lower leg: No edema.  Skin:    General: Skin is warm.  Neurological:     Mental Status: He is alert and oriented to person, place, and time.     Comments: Essential tremors of bilateral hands noted   Psychiatric:        Mood and Affect: Mood normal.        No results found for any visits on 06/27/24.  The 10-year ASCVD risk score (Arnett DK, et al., 2019) is: 33.1%     Assessment & Plan:   Assessment & Plan BPH with obstruction/lower urinary tract symptoms - Ordered PSA, continue Finasteride  5 mg daily  - Referred to urologist for further evaluation and management. Patient prefers [facility] urology.   Orders:   PSA; Future   Ambulatory referral to Urology  Type II diabetes mellitus with nephropathy (HCC) Management complicated due to s/e on SGLT-2 medications in the past. Developed skin infection.  Continue Telmisartan  40 mg daily.  Repeat urine microalbumin.  Patient has seen nephrologist Dr. Dennise, recommend he reaches out to his office to schedule an appointment. He was provided with phone number to Dr. Aureliano office.  Orders:   Urine Microalbumin w/creat. 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 Orders:   Urine Microalbumin w/creat. ratio; Future   Comp Met (CMET); Future  Type 2 diabetes mellitus with other specified complication, without long-term current use of insulin  (HCC) A1c improved with addition of Metformin  500 mg daily in adjunct with Linagliptan 5 mg daily, continue.  Continue Gabapentin  300 mg BID.  Orders:   gabapentin  (NEURONTIN ) 300 MG capsule; Take 1 capsule (300 mg total) by mouth 2 (two) times daily.  Mood disorder PHQ-9, GAD-7 reviewed. No SI/HI. Mood has been stable on venlafaxine  75 mg daily, continue.     Bradycardia Asymptomatic, recommend close monitoring and evaluation if he develops dizziness, palpitations.     I  personally spent a total of 40 minutes in the care of the patient today including preparing to see the patient, performing a medically appropriate exam/evaluation, counseling and educating, placing orders, referring and communicating with other health care professionals, documenting clinical information in the EHR, independently interpreting results, and communicating results.  Return in about 3 months (around 09/27/2024) for non-fasting labs in 1 week, f/u with Dr. Abbey in 3 months .   Luke Abbey, MD

## 2024-06-27 NOTE — Assessment & Plan Note (Addendum)
-   Ordered PSA, continue Finasteride  5 mg daily  - Referred to urologist for further evaluation and management. Patient prefers [facility] urology.   Orders:   PSA; Future   Ambulatory referral to Urology

## 2024-06-27 NOTE — Assessment & Plan Note (Signed)
 Asymptomatic, recommend close monitoring and evaluation if he develops dizziness, palpitations.

## 2024-06-27 NOTE — Patient Instructions (Addendum)
 Please reach out to Dr. Saralee Stank, MD/nephrologist's office to schedule a follow up appointment.   Ph number: [phone]   I am referring you to urologist to evaluate into urinary symptoms. If you do not hear from us  in 2 weeks to schedule an appointment please reach out to us .    Please take Gabapentin  300 mg twice a day. Do not take more than 600 mg in one day.   Please schedule a lab appointment at your convenience to get labs updated.

## 2024-06-27 NOTE — Assessment & Plan Note (Addendum)
 Management complicated due to s/e on SGLT-2 medications in the past. Developed skin infection.  Continue Telmisartan  40 mg daily.  Repeat urine microalbumin.  Patient has seen nephrologist Dr. Dennise, recommend he reaches out to his office to schedule an appointment. He was provided with phone number to Dr. Aureliano office.  Orders:   Urine Microalbumin w/creat. ratio; Future

## 2024-06-27 NOTE — Assessment & Plan Note (Addendum)
 Plan per type II DM with nephropathy, check CMP. Orders:   Urine Microalbumin w/creat. ratio; Future   Comp Met (CMET); Future

## 2024-06-27 NOTE — Assessment & Plan Note (Addendum)
 A1c improved with addition of Metformin  500 mg daily in adjunct with Linagliptan 5 mg daily, continue.  Continue Gabapentin  300 mg BID.  Orders:   gabapentin  (NEURONTIN ) 300 MG capsule; Take 1 capsule (300 mg total) by mouth 2 (two) times daily.

## 2024-06-27 NOTE — Assessment & Plan Note (Addendum)
 PHQ-9, GAD-7 reviewed. No SI/HI. Mood has been stable on venlafaxine  75 mg daily, continue.

## 2024-06-28 ENCOUNTER — Encounter: Payer: Self-pay | Admitting: Neurology

## 2024-06-29 DIAGNOSIS — M810 Age-related osteoporosis without current pathological fracture: Secondary | ICD-10-CM | POA: Diagnosis not present

## 2024-07-16 ENCOUNTER — Ambulatory Visit: Payer: Medicare PPO

## 2024-07-16 VITALS — BP 124/75 | Ht 67.0 in | Wt 149.0 lb

## 2024-07-16 DIAGNOSIS — Z0000 Encounter for general adult medical examination without abnormal findings: Secondary | ICD-10-CM

## 2024-07-16 NOTE — Progress Notes (Signed)
 Chief Complaint  Patient presents with   Medicare Wellness     Subjective:   Justin Patel is a 73 y.o. male who presents for a Medicare Annual Wellness Visit.  Visit info / Clinical Intake: Medicare Wellness Visit Type:: Subsequent Annual Wellness Visit Persons participating in visit and providing information:: patient Medicare Wellness Visit Mode:: Telephone If telephone:: video declined Since this visit was completed virtually, some vitals may be partially provided or unavailable. Missing vitals are due to the limitations of the virtual format.: Documented vitals are patient reported If Telephone or Video please confirm:: I connected with patient using audio/video enable telemedicine. I verified patient identity with two identifiers, discussed telehealth limitations, and patient agreed to proceed. Patient Location:: Home Provider Location:: Office/Home Interpreter Needed?: No Pre-visit prep was completed: yes AWV questionnaire completed by patient prior to visit?: no Living arrangements:: lives with spouse/significant other Patient's Overall Health Status Rating: good Typical amount of pain: some (pain has been going on for years) Does pain affect daily life?: (!) yes Are you currently prescribed opioids?: no  Dietary Habits and Nutritional Risks How many meals a day?: 2 Eats fruit and vegetables daily?: yes Most meals are obtained by: preparing own meals In the last 2 weeks, have you had any of the following?: (!) nausea, vomiting, diarrhea (a little diarrhea middle of last week, wife had it also, doing fine now) Diabetic:: (!) yes Any non-healing wounds?: no How often do you check your BS?: as needed Would you like to be referred to a Nutritionist or for Diabetic Management? : no  Functional Status Activities of Daily Living (to include ambulation/medication): Independent Ambulation: Independent Medication Administration: Independent Home Management (perform basic  housework or laundry): Independent Manage your own finances?: yes Primary transportation is: driving Concerns about vision?: no *vision screening is required for WTM* Concerns about hearing?: (!) yes Uses hearing aids?: (!) yes Hear whispered voice?: -- (televisit)  Fall Screening Falls in the past year?: 0 Number of falls in past year: 0 Was there an injury with Fall?: 0 Fall Risk Category Calculator: 0 Patient Fall Risk Level: Low Fall Risk  Fall Risk Patient at Risk for Falls Due to: No Fall Risks Fall risk Follow up: Falls evaluation completed; Falls prevention discussed  Home and Transportation Safety: All rugs have non-skid backing?: yes All stairs or steps have railings?: yes Grab bars in the bathtub or shower?: yes Have non-skid surface in bathtub or shower?: yes Good home lighting?: yes Regular seat belt use?: yes Hospital stays in the last year:: (!) yes How many hospital stays:: 1 Reason: confusion  Cognitive Assessment Difficulty concentrating, remembering, or making decisions? : no Will 6CIT or Mini Cog be Completed: yes What year is it?: 0 points What month is it?: 0 points Give patient an address phrase to remember (5 components): [address], [place] About what time is it?: 0 points Count backwards from 20 to 1: 0 points Say the months of the year in reverse: 0 points Repeat the address phrase from earlier: 0 points 6 CIT Score: 0 points  Advance Directives (For Healthcare) Does Patient Have a Medical Advance Directive?: Yes Does patient want to make changes to medical advance directive?: No - Patient declined Type of Advance Directive: Healthcare Power of [place]; Living will Copy of Healthcare Power of Attorney in Chart?: No - copy requested Copy of Living Will in Chart?: No - copy requested  Reviewed/Updated  Reviewed/Updated: Reviewed All (Medical, Surgical, Family, Medications, Allergies, Care  Teams, Patient Goals)    Allergies  (verified) Altace [ramipril], Ace inhibitors, Levaquin [levofloxacin in d5w], Lyrica [pregabalin], Metformin , and Trulicity  [dulaglutide ]   Current Medications (verified) Outpatient Encounter Medications as of 07/16/2024  Medication Sig   BUTRANS 20 MCG/HR PTWK Place 1 patch onto the skin once a week.   Calcium  Carbonate-Vitamin D  (CALCIUM  600/VITAMIN D  PO) Take 1 tablet by mouth daily after supper.   esomeprazole  (NEXIUM ) 40 MG capsule Take 1 capsule (40 mg total) by mouth daily.   ezetimibe  (ZETIA ) 10 MG tablet Take 1 tablet (10 mg total) by mouth daily.   finasteride  (PROSCAR ) 5 MG tablet Take 1 tablet (5 mg total) by mouth daily.   fluticasone  (FLONASE ) 50 MCG/ACT nasal spray Place 2 sprays into both nostrils daily.   [START ON 08/13/2024] gabapentin  (NEURONTIN ) 300 MG capsule Take 1 capsule (300 mg total) by mouth 2 (two) times daily.   linagliptin  (TRADJENTA ) 5 MG TABS tablet Take 1 tablet (5 mg total) by mouth daily.   metFORMIN  (GLUCOPHAGE -XR) 500 MG 24 hr tablet Take 1 tablet (500 mg total) by mouth daily with breakfast.   metroNIDAZOLE  (METROGEL ) 0.75 % gel Apply 1 Application topically at bedtime. qhs to face for Rosacea   Multiple Vitamin (MULTIVITAMIN) capsule Take 1 capsule by mouth daily. With copper  and zinc    primidone  (MYSOLINE ) 50 MG tablet TAKE 1 TABLET BY MOUTH AT BEDTIME   PROLIA  60 MG/ML SOSY injection Inject 60 mg into the skin once.   rosuvastatin  (CRESTOR ) 40 MG tablet Take 1 tablet (40 mg total) by mouth at bedtime.   sucralfate  (CARAFATE ) 1 g tablet Take 1 tablet (1 g total) by mouth 4 (four) times daily.   telmisartan  (MICARDIS ) 40 MG tablet Take 1 tablet (40 mg total) by mouth daily.   venlafaxine  XR (EFFEXOR -XR) 75 MG 24 hr capsule Take 1 capsule (75 mg total) by mouth daily.   No facility-administered encounter medications on file as of 07/16/2024.    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  removal scheduled to clear margins.   CHF (congestive heart failure) (HCC)    no longer after weight loss   Chronic abdominal pain 02/27/2019   Chronic kidney disease    cysts   Degenerative disc disease    Depression    Diabetes mellitus    no longer diabetic-or on meds   Diarrhea 07/22/2023   DJD (degenerative joint disease)    Dysplastic nevus 02/04/2016   R upper back paraspinal - severe   Dyspnea on exertion 08/24/2018   Family history of adverse reaction to anesthesia    sons wake up combative   Foot fracture 02/02/2022   Foot fracture, left 01/2022   Fracture of L1 vertebra (HCC) 01/14/2020   Gastric ulcer    GERD (gastroesophageal reflux disease)    Grief 03/15/2023   H/O hiatal hernia    Headache(784.0)    sinus   Heart murmur    History of fusion of thoracic spine 12/28/2021   History of gastric bypass    Hx of congestive heart failure    Hyperlipidemia    Hypertension    Intertrigo 02/24/2017   Iron  deficiency anemia 02/28/2015  Itchy skin 12/04/2019   Lumbar scoliosis    Numbness of face 05/22/2019   Opiate abuse, continuous (HCC) 06/20/2015   Orthostatic hypotension 09/08/2023   Otitis media 09/07/2023   Pneumonia 11/20/2022   Rash 03/11/2017   RLQ abdominal pain 05/18/2018   S/P laparoscopic cholecystectomy 02/18/2020   Sacral fracture, closed (HCC)    Seizures (HCC)    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, after GI bleed   Sleep apnea    hx not now since wt loss   Sore throat 03/15/2023   Stones in the urinary tract    Syncope and collapse    Transaminitis 09/07/2023   Tylenol  overdose, accidental or unintentional, initial encounter 09/07/2023   Vertigo 09/27/2023   Vitamin D  deficiency 07/20/2017   Past Surgical History:  Procedure  Laterality Date   ANTERIOR CERVICAL DECOMP/DISCECTOMY FUSION  01/26/2012   Procedure: ANTERIOR CERVICAL DECOMPRESSION/DISCECTOMY FUSION 3 LEVELS;  Surgeon: Catalina CHRISTELLA Stains, MD;  Location: [facility];  Service: Neurosurgery;  Laterality: N/A;  Cervical three-four Cervical four-five Cervical five-six Cervical six-seven , Anterior cervical decompression/diskectomy, fusion, plate   ANTERIOR LAT LUMBAR FUSION Right 04/25/2018   Procedure: Right Lumbar two-three Lumbar three-four Lumbar four-five anterior  lateral interbody fusion  with posterior percutaneous pedicle screws;  Surgeon: Unice Pac, MD;  Location: [facility] OR;  Service: Neurosurgery;  Laterality: Right;   APPENDECTOMY     CARDIAC CATHETERIZATION     [facility], normal   CARDIAC CATHETERIZATION     [place]   CHOLECYSTECTOMY     COLONOSCOPY WITH PROPOFOL  N/A 12/27/2017   Procedure: COLONOSCOPY WITH PROPOFOL ;  Surgeon: Toledo, Ladell POUR, MD;  Location: [facility];  Service: Gastroenterology;  Laterality: N/A;   ESOPHAGOGASTRODUODENOSCOPY (EGD) WITH PROPOFOL  N/A 12/27/2017   Procedure: ESOPHAGOGASTRODUODENOSCOPY (EGD) WITH PROPOFOL ;  Surgeon: Toledo, Ladell POUR, MD;  Location: [facility];  Service: Gastroenterology;  Laterality: N/A;   ESOPHAGOGASTRODUODENOSCOPY (EGD) WITH PROPOFOL  N/A 06/19/2021   Procedure: ESOPHAGOGASTRODUODENOSCOPY (EGD) WITH PROPOFOL ;  Surgeon: Maryruth Ole DASEN, MD;  Location: [facility];  Service: Gastroenterology;  Laterality: N/A;   ESOPHAGOGASTRODUODENOSCOPY (EGD) WITH PROPOFOL  N/A 09/25/2021   Procedure: ESOPHAGOGASTRODUODENOSCOPY (EGD) WITH PROPOFOL ;  Surgeon: Maryruth Ole DASEN, MD;  Location: [facility];  Service: Endoscopy;  Laterality: N/A;   GASTRIC BYPASS  08/09/2008   [facility]   HARDWARE REMOVAL N/A 03/08/2022   Procedure: Removal of bilateral Thoracic ten pedicle screws;  Surgeon: Louis Shove, MD;  Location: [facility] OR;  Service: Neurosurgery;  Laterality: N/A;   HERNIA  REPAIR  08/09/1998   hiatal   JOINT REPLACEMENT     KNEE ARTHROSCOPY     bilateral, left x 2   LAMINECTOMY WITH POSTERIOR LATERAL ARTHRODESIS LEVEL 4 N/A 01/14/2020   Procedure: Decompressive Laminectomy Lumbar One with pedicle screw fixation from Thoracic Ten to Lumbar Two;  Surgeon: Unice Pac, MD;  Location: [facility] OR;  Service: Neurosurgery;  Laterality: N/A;  Decompressive Laminectomy Lumbar One with pedicle screw fixation from Thoracic Ten to Lumbar Two   LUMBAR PERCUTANEOUS PEDICLE SCREW 3 LEVEL N/A 04/25/2018   Procedure: LUMBAR PERCUTANEOUS PEDICLE SCREW 3 LEVEL;  Surgeon: Unice Pac, MD;  Location: [facility] OR;  Service: Neurosurgery;  Laterality: N/A;   NASAL SINUS SURGERY     x5   OSTEOTOMY  08/10/1999   left   ROUX-EN-Y GASTRIC BYPASS     THORACIC LAMINECTOMY FOR EPIDURAL ABSCESS N/A 03/28/2022   Procedure: THORACIC WOUND WASHOUT;  Surgeon: Joshua Alm RAMAN, MD;  Location: [facility] OR;  Service: Neurosurgery;  Laterality: N/A;   TONSILLECTOMY     TOTAL KNEE ARTHROPLASTY Left 05/09/2014   Dr. Mardee   UVULOPALATOPLASTY  08/09/2009   Family History  Problem Relation Age of Onset   Dementia Mother 14   Osteoporosis Mother    Heart disease Father 65   Diabetes Father    Lymphoma Sister        lymphoma, stage 4   Ovarian cancer Sister 53       Ovarian   Lupus Sister    Prostate cancer Maternal Uncle    Skin cancer Maternal Uncle    Tongue cancer Maternal Uncle        uncle died of heart attack   Alcoholism Maternal Uncle    Lung cancer Paternal Aunt        pat aunts x 4 died of lung cancer   Lung cancer Paternal Aunt    Lung cancer Paternal Aunt    Lung cancer Paternal Aunt    Mesothelioma Cousin        paternal cousin   Social History   Occupational History   Not on file  Tobacco Use   Smoking status: Never   Smokeless tobacco: Never  Vaping Use   Vaping status: Never Used  Substance and Sexual Activity   Alcohol use: Not Currently    Comment: not currently   Drug  use: No   Sexual activity: Yes    Partners: Female   Tobacco Counseling Counseling given: Not Answered  SDOH Screenings   Food Insecurity: No Food Insecurity (07/16/2024)  Housing: Low Risk  (07/16/2024)  Transportation Needs: No Transportation Needs (07/16/2024)  Utilities: Not At Risk (07/16/2024)  Alcohol Screen: Low Risk  (07/16/2024)  Depression (PHQ2-9): Low Risk  (07/16/2024)  Financial Resource Strain: Low Risk  (07/16/2024)  Physical Activity: Inactive (07/16/2024)  Social Connections: Moderately Integrated (07/16/2024)  Stress: No Stress Concern Present (07/16/2024)  Tobacco Use: Low Risk  (07/16/2024)  Health Literacy: Adequate Health Literacy (07/16/2024)   See flowsheets for full screening details  Depression Screen PHQ 2 & 9 Depression Scale- Over the past 2 weeks, how often have you been bothered by any of the following problems? Little interest or pleasure in doing things: 0 Feeling down, depressed, or hopeless (PHQ Adolescent also includes...irritable): 0 PHQ-2 Total Score: 0 Trouble falling or staying asleep, or sleeping too much: 0 Feeling tired or having little energy: 0 Poor appetite or overeating (PHQ Adolescent also includes...weight loss): 0 Feeling bad about yourself - or that you are a failure or have let yourself or your family down: 0 Trouble concentrating on things, such as reading the newspaper or watching television (PHQ Adolescent also includes...like school work): 0 Moving or speaking so slowly that other people could have noticed. Or the opposite - being so fidgety or restless that you have been moving around a lot more than usual: 0 Thoughts that you would be better off dead, or of hurting yourself in some way: 0 PHQ-9 Total Score: 0 If you checked off any problems, how difficult have these problems made it for you to do your work, take care of things at home, or get along with other people?: Not difficult at all     Goals Addressed              This Visit's Progress    Patient Stated       Wants to take walks             Objective:  Today's Vitals   07/16/24 1335  BP: 124/75  Weight: 149 lb (67.6 kg)  Height: 5' 7 (1.702 m)   Body mass index is 23.34 kg/m.  Hearing/Vision screen Hearing Screening - Comments:: Wears aids Vision Screening - Comments:: Glasses, [facility], up to date Immunizations and Health Maintenance Health Maintenance  Topic Date Due   FOOT EXAM  04/29/2023   DTaP/Tdap/Td (2 - Td or Tdap) 06/03/2024   OPHTHALMOLOGY EXAM  08/21/2024   COVID-19 Vaccine (4 - Mixed Product risk 2025-26 season) 11/01/2024   HEMOGLOBIN A1C  11/12/2024   Diabetic kidney evaluation - Urine ACR  02/06/2025   Diabetic kidney evaluation - eGFR measurement  04/10/2025   Medicare Annual Wellness (AWV)  07/16/2025   Colonoscopy  12/28/2027   Pneumococcal Vaccine: 50+ Years  Completed   Influenza Vaccine  Completed   Hepatitis C Screening  Completed   Zoster Vaccines- Shingrix  Completed   Meningococcal B Vaccine  Aged Out        Assessment/Plan:  This is a routine wellness examination for [place].  Patient Care Team: Bair, Kalpana, MD as PCP - General (Family Medicine) Darron Deatrice LABOR, MD as PCP - Cardiology (Cardiology) Skeet Juliene SAUNDERS, DO as Consulting Physician (Neurology) Rennie Cindy SAUNDERS, MD as Consulting Physician (Oncology) Homsher, Wanda, RN as VBCI Care Management Solum, Therisa HERO, MD as Physician Assistant (Endocrinology)  I have personally reviewed and noted the following in the patient's chart:   Medical and social history Use of alcohol, tobacco or illicit drugs  Current medications and supplements including opioid prescriptions. Functional ability and status Nutritional status Physical activity Advanced directives List of other physicians Hospitalizations, surgeries, and ER visits in previous 12 months Vitals Screenings to include cognitive, depression, and falls Referrals and  appointments  No orders of the defined types were placed in this encounter.  In addition, I have reviewed and discussed with patient certain preventive protocols, quality metrics, and best practice recommendations. A written personalized care plan for preventive services as well as general preventive health recommendations were provided to patient.   Angeline Fredericks, LPN   [id number]   Return in 1 year (on 07/16/2025).  After Visit Summary: (Pick Up) Due to this being a telephonic visit, with patients personalized plan was offered to patient and patient has requested to Pick up at office.  Nurse Notes: Discussed the need to update tetanus vaccine.Patient needs a diabetic foot exam at next office visit.

## 2024-07-16 NOTE — Patient Instructions (Signed)
 Mr. Justin Patel,  Thank you for taking the time for your Medicare Wellness Visit. I appreciate your continued commitment to your health goals. Please review the care plan we discussed, and feel free to reach out if I can assist you further.  Please note that Annual Wellness Visits do not include a physical exam. Some assessments may be limited, especially if the visit was conducted virtually. If needed, we may recommend an in-person follow-up with your provider.  Ongoing Care Seeing your primary care provider every 3 to 6 months helps us  monitor your health and provide consistent, personalized care.  Remember to update your tetanus vaccine.   Referrals If a referral was made during today's visit and you haven't received any updates within two weeks, please contact the referred provider directly to check on the status.  Recommended Screenings:  Health Maintenance  Topic Date Due   Complete foot exam   04/29/2023   DTaP/Tdap/Td vaccine (2 - Td or Tdap) 06/03/2024   Eye exam for diabetics  08/21/2024   COVID-19 Vaccine (4 - Mixed Product risk 2025-26 season) 11/01/2024   Hemoglobin A1C  11/12/2024   Yearly kidney health urinalysis for diabetes  02/06/2025   Yearly kidney function blood test for diabetes  04/10/2025   Medicare Annual Wellness Visit  07/16/2025   Colon Cancer Screening  12/28/2027   Pneumococcal Vaccine for age over 38  Completed   Flu Shot  Completed   Hepatitis C Screening  Completed   Zoster (Shingles) Vaccine  Completed   Meningitis B Vaccine  Aged Out       07/16/2024    1:40 PM  Advanced Directives  Does Patient Have a Medical Advance Directive? Yes  Type of Estate Agent of [place];Living will  Does patient want to make changes to medical advance directive? No - Patient declined  Copy of Healthcare Power of Attorney in Chart? No - copy requested    Vision: Annual vision screenings are recommended for early detection of glaucoma,  cataracts, and diabetic retinopathy. These exams can also reveal signs of chronic conditions such as diabetes and high blood pressure.  Dental: Annual dental screenings help detect early signs of oral cancer, gum disease, and other conditions linked to overall health, including heart disease and diabetes.  Please see the attached documents for additional preventive care recommendations.

## 2024-07-17 ENCOUNTER — Ambulatory Visit: Admitting: Urology

## 2024-08-03 ENCOUNTER — Other Ambulatory Visit: Payer: Self-pay

## 2024-08-03 DIAGNOSIS — E1169 Type 2 diabetes mellitus with other specified complication: Secondary | ICD-10-CM

## 2024-08-03 DIAGNOSIS — I10 Essential (primary) hypertension: Secondary | ICD-10-CM

## 2024-08-03 DIAGNOSIS — E1121 Type 2 diabetes mellitus with diabetic nephropathy: Secondary | ICD-10-CM

## 2024-08-13 ENCOUNTER — Inpatient Hospital Stay

## 2024-08-13 ENCOUNTER — Ambulatory Visit

## 2024-08-13 ENCOUNTER — Inpatient Hospital Stay: Attending: Internal Medicine

## 2024-08-13 ENCOUNTER — Inpatient Hospital Stay: Admitting: Internal Medicine

## 2024-08-13 ENCOUNTER — Encounter: Payer: Self-pay | Admitting: Internal Medicine

## 2024-08-13 VITALS — BP 171/87 | HR 50

## 2024-08-13 VITALS — BP 152/67 | HR 34 | Temp 95.9°F | Resp 12 | Ht 67.0 in | Wt 150.3 lb

## 2024-08-13 VITALS — BP 136/64 | HR 51 | Temp 97.9°F | Ht 67.0 in | Wt 149.8 lb

## 2024-08-13 DIAGNOSIS — Z808 Family history of malignant neoplasm of other organs or systems: Secondary | ICD-10-CM | POA: Diagnosis not present

## 2024-08-13 DIAGNOSIS — K219 Gastro-esophageal reflux disease without esophagitis: Secondary | ICD-10-CM

## 2024-08-13 DIAGNOSIS — N62 Hypertrophy of breast: Secondary | ICD-10-CM | POA: Diagnosis not present

## 2024-08-13 DIAGNOSIS — D7589 Other specified diseases of blood and blood-forming organs: Secondary | ICD-10-CM | POA: Insufficient documentation

## 2024-08-13 DIAGNOSIS — N6320 Unspecified lump in the left breast, unspecified quadrant: Secondary | ICD-10-CM

## 2024-08-13 DIAGNOSIS — E1169 Type 2 diabetes mellitus with other specified complication: Secondary | ICD-10-CM | POA: Diagnosis not present

## 2024-08-13 DIAGNOSIS — Z807 Family history of other malignant neoplasms of lymphoid, hematopoietic and related tissues: Secondary | ICD-10-CM | POA: Insufficient documentation

## 2024-08-13 DIAGNOSIS — E611 Iron deficiency: Secondary | ICD-10-CM

## 2024-08-13 DIAGNOSIS — R928 Other abnormal and inconclusive findings on diagnostic imaging of breast: Secondary | ICD-10-CM

## 2024-08-13 DIAGNOSIS — E782 Mixed hyperlipidemia: Secondary | ICD-10-CM

## 2024-08-13 DIAGNOSIS — N644 Mastodynia: Secondary | ICD-10-CM

## 2024-08-13 DIAGNOSIS — Z801 Family history of malignant neoplasm of trachea, bronchus and lung: Secondary | ICD-10-CM | POA: Insufficient documentation

## 2024-08-13 DIAGNOSIS — Z803 Family history of malignant neoplasm of breast: Secondary | ICD-10-CM | POA: Insufficient documentation

## 2024-08-13 DIAGNOSIS — E1121 Type 2 diabetes mellitus with diabetic nephropathy: Secondary | ICD-10-CM

## 2024-08-13 DIAGNOSIS — N183 Chronic kidney disease, stage 3 unspecified: Secondary | ICD-10-CM | POA: Insufficient documentation

## 2024-08-13 DIAGNOSIS — Z7984 Long term (current) use of oral hypoglycemic drugs: Secondary | ICD-10-CM

## 2024-08-13 DIAGNOSIS — D509 Iron deficiency anemia, unspecified: Secondary | ICD-10-CM | POA: Diagnosis present

## 2024-08-13 DIAGNOSIS — Z9884 Bariatric surgery status: Secondary | ICD-10-CM | POA: Insufficient documentation

## 2024-08-13 DIAGNOSIS — I10 Essential (primary) hypertension: Secondary | ICD-10-CM

## 2024-08-13 DIAGNOSIS — M818 Other osteoporosis without current pathological fracture: Secondary | ICD-10-CM | POA: Diagnosis not present

## 2024-08-13 DIAGNOSIS — I5032 Chronic diastolic (congestive) heart failure: Secondary | ICD-10-CM | POA: Insufficient documentation

## 2024-08-13 DIAGNOSIS — E538 Deficiency of other specified B group vitamins: Secondary | ICD-10-CM | POA: Insufficient documentation

## 2024-08-13 DIAGNOSIS — M51369 Other intervertebral disc degeneration, lumbar region without mention of lumbar back pain or lower extremity pain: Secondary | ICD-10-CM

## 2024-08-13 LAB — BASIC METABOLIC PANEL - CANCER CENTER ONLY
Anion gap: 9 (ref 5–15)
BUN: 15 mg/dL (ref 8–23)
CO2: 21 mmol/L — ABNORMAL LOW (ref 22–32)
Calcium: 8.7 mg/dL — ABNORMAL LOW (ref 8.9–10.3)
Chloride: 105 mmol/L (ref 98–111)
Creatinine: 1.22 mg/dL (ref 0.61–1.24)
GFR, Estimated: >60 mL/min
Glucose, Bld: 91 mg/dL (ref 70–99)
Potassium: 3.5 mmol/L (ref 3.5–5.1)
Sodium: 135 mmol/L (ref 135–145)

## 2024-08-13 LAB — VITAMIN D 25 HYDROXY (VIT D DEFICIENCY, FRACTURES): Vit D, 25-Hydroxy: 46.9 ng/mL (ref 30–100)

## 2024-08-13 LAB — CBC WITH DIFFERENTIAL (CANCER CENTER ONLY)
Abs Immature Granulocytes: 0.02 K/uL (ref 0.00–0.07)
Basophils Absolute: 0 K/uL (ref 0.0–0.1)
Basophils Relative: 1 %
Eosinophils Absolute: 0.1 K/uL (ref 0.0–0.5)
Eosinophils Relative: 3 %
HCT: 38.4 % — ABNORMAL LOW (ref 39.0–52.0)
Hemoglobin: 12.8 g/dL — ABNORMAL LOW (ref 13.0–17.0)
Immature Granulocytes: 1 %
Lymphocytes Relative: 43 %
Lymphs Abs: 1.8 K/uL (ref 0.7–4.0)
MCH: 34.4 pg — ABNORMAL HIGH (ref 26.0–34.0)
MCHC: 33.3 g/dL (ref 30.0–36.0)
MCV: 103.2 fL — ABNORMAL HIGH (ref 80.0–100.0)
Monocytes Absolute: 0.3 K/uL (ref 0.1–1.0)
Monocytes Relative: 7 %
Neutro Abs: 1.9 K/uL (ref 1.7–7.7)
Neutrophils Relative %: 45 %
Platelet Count: 171 K/uL (ref 150–400)
RBC: 3.72 MIL/uL — ABNORMAL LOW (ref 4.22–5.81)
RDW: 15.5 % (ref 11.5–15.5)
WBC Count: 4.1 K/uL (ref 4.0–10.5)
nRBC: 0 % (ref 0.0–0.2)

## 2024-08-13 LAB — VITAMIN B12: Vitamin B-12: 861 pg/mL (ref 180–914)

## 2024-08-13 MED ORDER — ESOMEPRAZOLE MAGNESIUM 40 MG PO CPDR: 40.0000 mg | DELAYED_RELEASE_CAPSULE | Freq: Every day | ORAL | 3 refills | Status: AC

## 2024-08-13 MED ORDER — CYANOCOBALAMIN 1000 MCG/ML IJ SOLN
1000.0000 ug | Freq: Once | INTRAMUSCULAR | Status: AC
  Administered 2024-08-13: 1000 ug via INTRAMUSCULAR
  Filled 2024-08-13: qty 1

## 2024-08-13 MED ORDER — IRON SUCROSE 20 MG/ML IV SOLN
200.0000 mg | Freq: Once | INTRAVENOUS | Status: AC
  Administered 2024-08-13: 200 mg via INTRAVENOUS
  Filled 2024-08-13: qty 10

## 2024-08-13 MED ORDER — SODIUM CHLORIDE 0.9% FLUSH
10.0000 mL | Freq: Once | INTRAVENOUS | Status: AC | PRN
  Administered 2024-08-13: 10 mL
  Filled 2024-08-13: qty 10

## 2024-08-13 NOTE — Progress Notes (Signed)
 Fatigue/weakness: YES Dyspena: NO Light headedness: NO Blood in stool: NO   C/o losing weight, appetite is fair. No n/v.  C/O LT breast protruding and sore, x3-4 months. No discharge or bruising. Also, having some red spots pop up at times.

## 2024-08-13 NOTE — Assessment & Plan Note (Addendum)
 Nephropathy, neuropathy, dyslipidemia, hypertension Orders:   HgB A1c; Future   Urine Microalbumin w/creat. ratio; Future

## 2024-08-13 NOTE — Progress Notes (Signed)
 [facility] CONSULT NOTE  Patient Care Team: Bair, Kalpana, MD as PCP - General (Family Medicine) Darron Deatrice LABOR, MD as PCP - Cardiology (Cardiology) Skeet Juliene SAUNDERS, DO as Consulting Physician (Neurology) Rennie Cindy SAUNDERS, MD as Consulting Physician (Oncology) Homsher, Wanda, RN as VBCI Care Management Solum, Therisa HERO, MD as Physician Assistant (Endocrinology)  CHIEF COMPLAINTS/PURPOSE OF CONSULTATION: ANEMIA   HEMATOLOGY HISTORY  # ANEMIA [Hb; MCV-platelets- WBC; Iron  sat; ferritin;  GFR- CT/US ; FEB 2023- EGD [[facility]]/- Evidence of a Roux-en-Y gastrojejunostomy was found. The gastrojejunal anastomosis was characterized by healthy appearing mucosa. The jejunojejunal anastomosis was characterized by healthy appearing mucosa. Previous gastric ulcer has healed. 2019-Colonoscopy   Latest Reference Range & Units 01/05/22 11:13  TIBC 250.0 - 450.0 mcg/dL 466.5 (H)  Saturation Ratios 20.0 - 50.0 % 10.3 (L)  Ferritin 22.0 - 322.0 ng/mL 12.5 (L)  Transferrin 212.0 - 360.0 mg/dL 618.9 (H)  (H): Data is abnormally high (L): Data is abnormally low  # Leucocytosis/weight loss/extreme fatigue-bone marrow biopsy-June 2019-unremarkable.   #Mild macrocytosis without anemia-question copper  deficiency/gastric bypass; recommend p.o. copper .   #  SDH [[place]- 2015]; gastric Bypass Surgery    Latest Reference Range & Units 01/05/22 11:13  TIBC 250.0 - 450.0 mcg/dL 466.5 (H)  Saturation Ratios 20.0 - 50.0 % 10.3 (L)  Ferritin 22.0 - 322.0 ng/mL 12.5 (L)  Transferrin 212.0 - 360.0 mg/dL 618.9 (H)  (H): Data is abnormally high (L): Data is abnormally low   No history exists.    Latest Reference Range & Units 01/05/22 11:13  WBC 4.0 - 10.5 K/uL 8.4  RBC 4.22 - 5.81 Mil/uL 4.19 (L)  Hemoglobin 13.0 - 17.0 g/dL 87.6 (L)  HCT 60.9 - 47.9 % 38.3 (L)  MCV 78.0 - 100.0 fl 91.4  MCHC 30.0 - 36.0 g/dL 67.9  RDW 88.4 - 84.4 % 16.7 (H)  Platelets 150.0 - 400.0 K/uL 207.0  (L):  Data is abnormally low (H): Data is abnormally high  HISTORY OF PRESENTING ILLNESS: Patient ambulating independently.  Alone.   Justin Patel 74 y.o.  male pleasant patient with a history of gastric bypass -anemia/iron  deficiency; chronic back pain s/p multiple surgeries osteoporosis is here for follow-up.  Discussed the use of AI scribe software for clinical note transcription with the patient, who gave verbal consent to proceed.  History of Present Illness   Justin Patel is a 74 year old male with iron  deficiency anemia who presents with new left breast pain and swelling.  Over the past several months, he has developed left breast pain and swelling, describing the area as protruding and really sore, with pain localized to the nipple and surrounding tissue. He denies nipple discharge, trauma, or fractures to the area. He recalls a prior mammogram several years ago that was reportedly normal.  He reports unintentional weight loss but maintains a normal appetite and continues to eat well. He is aware that his current patch medication may cause appetite changes but does not attribute the weight loss to decreased intake. He has also noticed bruising, though he did not specify the location or severity.  He continues to receive regular iron  infusions and Biktarvy injections.  He has a significant family history of breast cancer in three paternal aunts.       Review of Systems  Constitutional:  Positive for malaise/fatigue. Negative for chills, diaphoresis, fever and weight loss.  HENT:  Negative for nosebleeds and sore throat.   Eyes:  Negative for double vision.  Respiratory:  Negative for cough, hemoptysis, sputum production, shortness of breath and wheezing.   Cardiovascular:  Negative for chest pain, palpitations, orthopnea and leg swelling.  Gastrointestinal:  Negative for abdominal pain, blood in stool, constipation, diarrhea, heartburn, melena, nausea and vomiting.   Genitourinary:  Negative for dysuria, frequency and urgency.  Musculoskeletal:  Positive for back pain and joint pain.  Skin: Negative.  Negative for itching and rash.  Neurological:  Negative for dizziness, tingling, focal weakness, weakness and headaches.  Endo/Heme/Allergies:  Does not bruise/bleed easily.  Psychiatric/Behavioral:  Negative for depression. The patient is not nervous/anxious and does not have insomnia.      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  removal scheduled to clear margins.   CHF (congestive heart failure) (HCC)    no longer after weight loss   Chronic abdominal pain 02/27/2019   Chronic kidney disease    cysts   Degenerative disc disease    Depression    Diabetes mellitus    no longer diabetic-or on meds   Diarrhea 07/22/2023   DJD (degenerative joint disease)    Dysplastic nevus 02/04/2016   R upper back paraspinal - severe   Dyspnea on exertion 08/24/2018   Family history of adverse reaction to anesthesia    sons wake up combative   Foot fracture 02/02/2022   Foot fracture, left 01/2022   Fracture of L1 vertebra (HCC) 01/14/2020   Gastric ulcer    GERD (gastroesophageal reflux disease)    Grief 03/15/2023   H/O hiatal hernia    Headache(784.0)    sinus   Heart murmur    History of fusion of thoracic spine 12/28/2021   History of gastric bypass    Hx of congestive heart failure    Hyperlipidemia    Hypertension    Intertrigo 02/24/2017   Iron  deficiency anemia 02/28/2015   Itchy skin 12/04/2019   Lumbar scoliosis    Numbness of face 05/22/2019   Opiate abuse, continuous (HCC) 06/20/2015   Orthostatic hypotension 09/08/2023   Otitis media 09/07/2023    Pneumonia 11/20/2022   Rash 03/11/2017   RLQ abdominal pain 05/18/2018   S/P laparoscopic cholecystectomy 02/18/2020   Sacral fracture, closed (HCC)    Seizures (HCC)    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, after GI bleed   Sleep apnea    hx not now since wt loss   Sore throat 03/15/2023   Stones in the urinary tract    Syncope and collapse    Transaminitis 09/07/2023   Tylenol  overdose, accidental or unintentional, initial encounter 09/07/2023   Vertigo 09/27/2023   Vitamin D  deficiency 07/20/2017    SURGICAL HISTORY: Past Surgical History:  Procedure Laterality Date   ANTERIOR CERVICAL DECOMP/DISCECTOMY FUSION  01/26/2012   Procedure: ANTERIOR CERVICAL DECOMPRESSION/DISCECTOMY FUSION 3 LEVELS;  Surgeon: Catalina CHRISTELLA Stains, MD;  Location: [facility];  Service: Neurosurgery;  Laterality: N/A;  Cervical three-four Cervical four-five Cervical five-six Cervical six-seven , Anterior cervical decompression/diskectomy, fusion, plate   ANTERIOR LAT LUMBAR FUSION Right 04/25/2018   Procedure: Right Lumbar two-three Lumbar three-four Lumbar four-five anterior  lateral interbody fusion  with posterior percutaneous pedicle screws;  Surgeon: Unice Pac,  MD;  Location: [facility] OR;  Service: Neurosurgery;  Laterality: Right;   APPENDECTOMY     CARDIAC CATHETERIZATION     [facility], normal   CARDIAC CATHETERIZATION     [place]   CHOLECYSTECTOMY     COLONOSCOPY WITH PROPOFOL  N/A 12/27/2017   Procedure: COLONOSCOPY WITH PROPOFOL ;  Surgeon: Toledo, Ladell POUR, MD;  Location: [facility] ENDOSCOPY;  Service: Gastroenterology;  Laterality: N/A;   ESOPHAGOGASTRODUODENOSCOPY (EGD) WITH PROPOFOL  N/A 12/27/2017   Procedure: ESOPHAGOGASTRODUODENOSCOPY (EGD) WITH PROPOFOL ;  Surgeon: Toledo, Ladell POUR, MD;  Location: [facility] ENDOSCOPY;  Service: Gastroenterology;  Laterality: N/A;   ESOPHAGOGASTRODUODENOSCOPY (EGD) WITH PROPOFOL  N/A 06/19/2021   Procedure: ESOPHAGOGASTRODUODENOSCOPY (EGD) WITH PROPOFOL ;   Surgeon: Maryruth Ole DASEN, MD;  Location: [facility] ENDOSCOPY;  Service: Gastroenterology;  Laterality: N/A;   ESOPHAGOGASTRODUODENOSCOPY (EGD) WITH PROPOFOL  N/A 09/25/2021   Procedure: ESOPHAGOGASTRODUODENOSCOPY (EGD) WITH PROPOFOL ;  Surgeon: Maryruth Ole DASEN, MD;  Location: [facility] ENDOSCOPY;  Service: Endoscopy;  Laterality: N/A;   GASTRIC BYPASS  08/09/2008   [facility]   HARDWARE REMOVAL N/A 03/08/2022   Procedure: Removal of bilateral Thoracic ten pedicle screws;  Surgeon: Louis Shove, MD;  Location[facility];  Service: Neurosurgery;  Laterality: N/A;   HERNIA REPAIR  08/09/1998   hiatal   JOINT REPLACEMENT     KNEE ARTHROSCOPY     bilateral, left x 2   LAMINECTOMY WITH POSTERIOR LATERAL ARTHRODESIS LEVEL 4 N/A 01/14/2020   Procedure: Decompressive Laminectomy Lumbar One with pedicle screw fixation from Thoracic Ten to Lumbar Two;  Surgeon: Unice Pac, MD;  Location: [facility] OR;  Service: Neurosurgery;  Laterality: N/A;  Decompressive Laminectomy Lumbar One with pedicle screw fixation from Thoracic Ten to Lumbar Two   LUMBAR PERCUTANEOUS PEDICLE SCREW 3 LEVEL N/A 04/25/2018   Procedure: LUMBAR PERCUTANEOUS PEDICLE SCREW 3 LEVEL;  Surgeon: Unice Pac, MD;  Location: [facility] OR;  Service: Neurosurgery;  Laterality: N/A;   NASAL SINUS SURGERY     x5   OSTEOTOMY  08/10/1999   left   ROUX-EN-Y GASTRIC BYPASS     THORACIC LAMINECTOMY FOR EPIDURAL ABSCESS N/A 03/28/2022   Procedure: THORACIC WOUND WASHOUT;  Surgeon: Joshua Alm RAMAN, MD;  Location: [facility] OR;  Service: Neurosurgery;  Laterality: N/A;   TONSILLECTOMY     TOTAL KNEE ARTHROPLASTY Left 05/09/2014   Dr. Mardee   UVULOPALATOPLASTY  08/09/2009    SOCIAL HISTORY: Social History   Socioeconomic History   Marital status: Married    Spouse name: Not on file   Number of children: 2   Years of education: Not on file   Highest education level: Not on file  Occupational History   Not on file  Tobacco Use   Smoking status: Never    Smokeless tobacco: Never  Vaping Use   Vaping status: Never Used  Substance and Sexual Activity   Alcohol use: Not Currently    Comment: not currently   Drug use: No   Sexual activity: Yes    Partners: Female  Other Topics Concern   Not on file  Social History Narrative   Lives in [place] with wife, [place]. 2 sons, Beverley FOLKS, Franky 30.. 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      Diet - regular diet, limited quantities after gastric bypass      Exercise - no regular, limited by arthritis in knees, occasional water aerobics   Right handed   One story house   Social Drivers  of Health   Tobacco Use: Low Risk (08/13/2024)   Patient History    Smoking Tobacco Use: Never    Smokeless Tobacco Use: Never    Passive Exposure: Not on file  Financial Resource Strain: Low Risk (07/16/2024)   Overall Financial Resource Strain (CARDIA)    Difficulty of Paying Living Expenses: Not hard at all  Food Insecurity: No Food Insecurity (07/16/2024)   Epic    Worried About Programme Researcher, Broadcasting/film/video in the Last Year: Never true    Ran Out of Food in the Last Year: Never true  Transportation Needs: No Transportation Needs (07/16/2024)   Epic    Lack of Transportation (Medical): No    Lack of Transportation (Non-Medical): No  Physical Activity: Inactive (07/16/2024)   Exercise Vital Sign    Days of Exercise per Week: 0 days    Minutes of Exercise per Session: 0 min  Stress: No Stress Concern Present (07/16/2024)   [employer] of Occupational Health - Occupational Stress Questionnaire    Feeling of Stress: Not at all  Social Connections: Moderately Integrated (07/16/2024)   Social Connection and Isolation Panel    Frequency of Communication with Friends and Family: Twice a week    Frequency of Social Gatherings with Friends and Family: Once a week    Attends Religious Services: More than 4 times per year    Active Member of [employer] or Organizations:  No    Attends Banker Meetings: Never    Marital Status: Married  Catering Manager Violence: Not At Risk (07/16/2024)   Epic    Fear of Current or Ex-Partner: No    Emotionally Abused: No    Physically Abused: No    Sexually Abused: No  Depression (PHQ2-9): Low Risk (08/13/2024)   Depression (PHQ2-9)    PHQ-2 Score: 0  Alcohol Screen: Low Risk (07/16/2024)   Alcohol Screen    Last Alcohol Screening Score (AUDIT): 0  Housing: Low Risk (07/16/2024)   Epic    Unable to Pay for Housing in the Last Year: No    Number of Times Moved in the Last Year: 0    Homeless in the Last Year: No  Utilities: Not At Risk (07/16/2024)   Epic    Threatened with loss of utilities: No  Health Literacy: Adequate Health Literacy (07/16/2024)   B1300 Health Literacy    Frequency of need for help with medical instructions: Never    FAMILY HISTORY: Family History  Problem Relation Age of Onset   Dementia Mother 83   Osteoporosis Mother    Heart disease Father 24   Diabetes Father    Lymphoma Sister        lymphoma, stage 4   Ovarian cancer Sister 28       Ovarian   Lupus Sister    Prostate cancer Maternal Uncle    Skin cancer Maternal Uncle    Tongue cancer Maternal Uncle        uncle died of heart attack   Alcoholism Maternal Uncle    Lung cancer Paternal Aunt        pat aunts x 4 died of lung cancer   Lung cancer Paternal Aunt    Lung cancer Paternal Aunt    Lung cancer Paternal Aunt    Mesothelioma Cousin        paternal cousin    ALLERGIES:  is allergic to altace [ramipril], ace inhibitors, levaquin [levofloxacin in d5w], lyrica [pregabalin], metformin , and trulicity  [dulaglutide ].  MEDICATIONS:  Current Outpatient Medications  Medication Sig Dispense Refill   BUTRANS 20 MCG/HR PTWK Place 1 patch onto the skin once a week.     Calcium  Carbonate-Vitamin D  (CALCIUM  600/VITAMIN D  PO) Take 1 tablet by mouth daily after supper.     esomeprazole  (NEXIUM ) 40 MG capsule Take 1  capsule (40 mg total) by mouth daily. 180 capsule 1   ezetimibe  (ZETIA ) 10 MG tablet Take 1 tablet (10 mg total) by mouth daily. 90 tablet 3   finasteride  (PROSCAR ) 5 MG tablet Take 1 tablet (5 mg total) by mouth daily. 90 tablet 1   fluticasone  (FLONASE ) 50 MCG/ACT nasal spray Place 2 sprays into both nostrils daily. 16 g 6   gabapentin  (NEURONTIN ) 300 MG capsule Take 1 capsule (300 mg total) by mouth 2 (two) times daily. 180 capsule 0   linagliptin  (TRADJENTA ) 5 MG TABS tablet Take 1 tablet (5 mg total) by mouth daily. 90 tablet 3   metFORMIN  (GLUCOPHAGE -XR) 500 MG 24 hr tablet Take 1 tablet (500 mg total) by mouth daily with breakfast. 90 tablet 1   metroNIDAZOLE  (METROGEL ) 0.75 % gel Apply 1 Application topically at bedtime. qhs to face for Rosacea 45 g 11   Multiple Vitamin (MULTIVITAMIN) capsule Take 1 capsule by mouth daily. With copper  and zinc      primidone  (MYSOLINE ) 50 MG tablet TAKE 1 TABLET BY MOUTH AT BEDTIME 30 tablet 5   PROLIA  60 MG/ML SOSY injection Inject 60 mg into the skin every 6 (six) months.     rosuvastatin  (CRESTOR ) 40 MG tablet Take 1 tablet (40 mg total) by mouth at bedtime. 90 tablet 3   sucralfate  (CARAFATE ) 1 g tablet Take 1 tablet (1 g total) by mouth 4 (four) times daily. 360 tablet 1   telmisartan  (MICARDIS ) 40 MG tablet Take 1 tablet (40 mg total) by mouth daily. 90 tablet 3   venlafaxine  XR (EFFEXOR -XR) 75 MG 24 hr capsule Take 1 capsule (75 mg total) by mouth daily. 90 capsule 3   No current facility-administered medications for this visit.     SABRA  PHYSICAL EXAMINATION:   Vitals:   08/13/24 0944 08/13/24 1004  BP: (!) 151/93 (!) 152/67  Pulse: (!) 34   Resp: 12   Temp: (!) 95.9 F (35.5 C)   SpO2: 100%     Filed Weights   08/13/24 0944  Weight: 150 lb 4.8 oz (68.2 kg)   Left BREAST exam  no unusual skin changes or dominant masses felt.   Physical Exam Vitals and nursing note reviewed.  HENT:     Head: Normocephalic and atraumatic.      Mouth/Throat:     Pharynx: Oropharynx is clear.  Eyes:     Extraocular Movements: Extraocular movements intact.     Pupils: Pupils are equal, round, and reactive to light.  Cardiovascular:     Rate and Rhythm: Normal rate and regular rhythm.  Pulmonary:     Comments: Decreased breath sounds bilaterally.  Abdominal:     Palpations: Abdomen is soft.  Musculoskeletal:        General: Normal range of motion.     Cervical back: Normal range of motion.  Skin:    General: Skin is warm.  Neurological:     General: No focal deficit present.     Mental Status: He is alert and oriented to person, place, and time.  Psychiatric:        Behavior: Behavior normal.        Judgment: Judgment  normal.      LABORATORY DATA:  I have reviewed the data as listed Lab Results  Component Value Date   WBC 4.1 08/13/2024   HGB 12.8 (L) 08/13/2024   HCT 38.4 (L) 08/13/2024   MCV 103.2 (H) 08/13/2024   PLT 171 08/13/2024   Recent Labs    03/01/24 0950 03/02/24 0852 03/22/24 1539 04/10/24 1433 08/13/24 0940  NA 141 142 136 132* 135  K 4.2 3.8 4.8 4.2 3.5  CL 114* 108 102 108 105  CO2 22 23 23  17* 21*  GLUCOSE 125* 115* 125* 89 91  BUN 30* 16 13 13 15   CREATININE 1.32* 0.89 1.09 1.12 1.22  CALCIUM  7.4* 7.8* 9.1 8.5* 8.7*  GFRNONAA 57* >60  --  >60 >60  PROT 5.5* 5.9* 6.1  --   --   ALBUMIN  2.5* 2.7* 3.8  --   --   AST 23 30 37  --   --   ALT 62* 56* 45*  --   --   ALKPHOS 51 54 72  --   --   BILITOT 0.7 0.7 0.2  --   --      No results found.   ASSESSMENT & PLAN:   Iron  deficiency # Iron  deficiency: sec to gastric bypass. Given macrocytosis-  Question low-grade MDS-but bone marrow biopsy would be recommended to confirm.  As long patient does not have worsening anemia-I would recommend continued surveillance without a bone marrow.    # Currently on maintenance iron  infusion/ venofer .  Today hemoglobin is 12.8- over all stable-.Proceed with Venofer  today- stable.   # B 12 def  [JUNE 2024-]- 233- s/p B12 shot- nov 2023; proceed with B12 shot every 3-4 months.  B12 shot today.  # Left breast enlargement/ nipple- tender [Hx paternal aunts x 3 breast cancer]- check mammogram/US - ? Possible from finasteride  [2019- mammo/US - [facility]]-   # Chronic back pain/osteoporosis [Dr.Poole;neurosurg]-status post surgery clinically stable.vit D NOV 2023- 43. Reclast  [[facility]; endocrinology]-  stable.   # CKD stage- II- III [Dr.Singh]- stable.   # DISPOSITION:  # Left breast check Diagnostic mammogram/US - ASAP-  # proceed with venofer  today; ok B12 shot injection # follow up in 4 months- MD; labs- cbc/bmp; vit D 25-OH; B12 levels-possible IV venofer ; B12 injection-Dr.B  All questions were answered. The patient knows to call the clinic with any problems, questions or concerns.    Cindy JONELLE Joe, MD 08/13/2024 11:05 AM

## 2024-08-13 NOTE — Assessment & Plan Note (Signed)
 Diagnostic mammogram ordered by hemeonc, patient provided with phone number to schedule appointment for mammogram if he does not hear for appointment within next 24-48 hours.  Hold off Finasteride  5 mg for BPH until appointment with urologist to discuss potential alternative options as this can contribute to gynecomastia.

## 2024-08-13 NOTE — Assessment & Plan Note (Signed)
 Continue Gabapentin  300 mg BID for lower back pain and continue follow up with pain clinic for pain management.

## 2024-08-13 NOTE — Patient Instructions (Signed)

## 2024-08-13 NOTE — Assessment & Plan Note (Addendum)
 On Prolia  injection twice a year, continue follow up with Dr. Solum for osteoporosis management.

## 2024-08-13 NOTE — Assessment & Plan Note (Addendum)
 Continue Nexium  40 mg daily and Carafate  1 gm four times a day.  Recommend reducing intake of Aspirin 325 mg.  Orders:   esomeprazole  (NEXIUM ) 40 MG capsule; Take 1 capsule (40 mg total) by mouth daily.

## 2024-08-13 NOTE — Assessment & Plan Note (Addendum)
 Continue Crestor  40 mg daily, Zetia  10 mg daily.

## 2024-08-13 NOTE — Assessment & Plan Note (Signed)
#   Iron  deficiency: sec to gastric bypass. Given macrocytosis-  Question low-grade MDS-but bone marrow biopsy would be recommended to confirm.  As long patient does not have worsening anemia-I would recommend continued surveillance without a bone marrow.    # Currently on maintenance iron  infusion/ venofer .  Today hemoglobin is 12.8- over all stable-.Proceed with Venofer  today- stable.   # B 12 def [JUNE 2024-]- 233- s/p B12 shot- nov 2023; proceed with B12 shot every 3-4 months.  B12 shot today.  # Left breast enlargement/ nipple- tender [Hx paternal aunts x 3 breast cancer]- check mammogram/US - ? Possible from finasteride  [2019- mammo/US - [facility]]-   # Chronic back pain/osteoporosis [Dr.Poole;neurosurg]-status post surgery clinically stable.vit D NOV 2023- 43. Reclast  [[facility]; endocrinology]-  stable.   # CKD stage- II- III [Dr.Singh]- stable.   # DISPOSITION:  # Left breast check Diagnostic mammogram/US - ASAP-  # proceed with venofer  today; ok B12 shot injection # follow up in 4 months- MD; labs- cbc/bmp; vit D 25-OH; B12 levels-possible IV venofer ; B12 injection-Dr.B

## 2024-08-13 NOTE — Progress Notes (Signed)
 Patient tolerated Venofer  infusion well. Explained recommendation of 30 min post monitoring. Patient refused to wait post monitoring. Educated on what signs to watch for & to call with any concerns. No questions, discharged. Stable

## 2024-08-13 NOTE — Patient Instructions (Addendum)
 For mammogram appointment please call the following if you do not hear from them within next 24-48 hours.  [facility] - call [phone]   Please talk to urologist Dr. GORMAN about continuation, benefit and side effects of Finasteride  specially given left breast pain.   You are dur for A1c and urine microalbumin check anytime after 08/14/24 please schedule lab only appointment on your way out after 08/14/24.

## 2024-08-13 NOTE — Assessment & Plan Note (Addendum)
 Patient's blood pressure on arrival was elevated. However, repeat blood pressure after 15 min rest was within goal. Given patient's age, co-morbidities, risk of fall, fragility recommend goal BP 140/80 mmHg or less.   Continue Telmisartan  40 mg daily.

## 2024-08-13 NOTE — Assessment & Plan Note (Signed)
 Repeat A1c, urine microalbumin after 08/14/2024.  Recommend nephrology evaluation by Dr. Dennise (previously seen by nephrologist Dr. Dennise) if patient continues to have microalbuminuria.  Unable to tolerate SGLT-2 due to groin rash.  Continue Metformin  500 mg daily, Telmisartan  40 mg daily, Trajenta 5 mg daily.  Orders:   Urine Microalbumin w/creat. ratio; Future

## 2024-08-13 NOTE — Progress Notes (Addendum)
 "  Established Patient Office Visit   Subjective  Patient ID: Justin Patel, male    DOB: [birth year]  Age: 74 y.o. MRN: [id number]  Chief Complaint  Patient presents with   Medical Management of Chronic Issues    Six month follow-up     Discussed the use of AI scribe software for clinical note transcription with the patient, who gave verbal consent to proceed.  History of Present Illness Justin Patel is a 74 year old male with hypertension, vitamin B12 deficiency, iron  deficiency anemia who presents for chronic medication management.   - He is established with heme oncologist Dr. Rennie and receives B12, iron  infusion. He has order for diagnostic mammogram of left breast ordered during his visit with hematologist this morning. He experiences soreness and protrusion in his left breast, with pain particularly around the nipple when pressed.   - He has a h/o BPH for which he is prescribed Finasteride  5 mg daily which helps with urinary symptoms. He has appointment with urologist Dr. Twylla on 08/14/24.   - DM type ii with nephropathy, CKD, HTN, peripheral neuropathy, hyperlipidemia:  Previous nephrologist Dr. Dennise, he has not been following up with him.  Currently he is on Metformin  500 mg daily, Trajenta 5 mg daily, Telmisartan  40 mg daily, Rosuvastatin  40 mg daily and zetia  10 mg daily. Not able to tolerate SGLT-2 in the past.  He is on Gabapentin  300 mg twice a day.   - Pain management with Buprenorphine patch through pain clinic. Reports his appetite mat be reduced due to pain patch. Pain is adequately controlled on current regime. He also takes Aspirin 325 mg once every couple of weeks to help with pain. Has noted some bruising of forearms.   - GERD is managed with Nexium  40 mg daily.  - Mood has been stable on venlafaxine  75 mg daily. He has enough energy for daily activities.  - No chest pain, palpitations, or reduced exercise tolerance. Reports occasional bruising and  itching.    ROS As per HPI    Objective:     BP 136/64   Pulse (!) 51   Temp 97.9 F (36.6 C)   Ht 5' 7 (1.702 m)   Wt 149 lb 12.8 oz (67.9 kg)   SpO2 97%   BMI 23.46 kg/m      08/13/2024    1:58 PM 08/13/2024    9:59 AM 07/16/2024    1:54 PM  Depression screen PHQ 2/9  Decreased Interest 0 0 0  Down, Depressed, Hopeless 0 0 0  PHQ - 2 Score 0 0 0  Altered sleeping 0  0  Tired, decreased energy 0  0  Change in appetite 0  0  Feeling bad or failure about yourself  0  0  Trouble concentrating 0  0  Moving slowly or fidgety/restless 0  0  Suicidal thoughts 0  0  PHQ-9 Score 0  0  Difficult doing work/chores Not difficult at all  Not difficult at all      08/13/2024    1:58 PM 06/27/2024    5:35 PM 03/26/2024    2:03 PM 02/07/2024    2:04 PM  GAD 7 : Generalized Anxiety Score  Nervous, Anxious, on Edge 0 0 0 0  Control/stop worrying 0 0 0 0  Worry too much - different things 0 0 0 0  Trouble relaxing 0 0 0 0  Restless 0 0 0 0  Easily annoyed or irritable 0 0  0 0  Afraid - awful might happen 0 0 0 0  Total GAD 7 Score 0 0 0 0  Anxiety Difficulty Not difficult at all Not difficult at all Not difficult at all Not difficult at all      08/13/2024    1:58 PM 08/13/2024    9:59 AM 07/16/2024    1:54 PM  Depression screen PHQ 2/9  Decreased Interest 0 0 0  Down, Depressed, Hopeless 0 0 0  PHQ - 2 Score 0 0 0  Altered sleeping 0  0  Tired, decreased energy 0  0  Change in appetite 0  0  Feeling bad or failure about yourself  0  0  Trouble concentrating 0  0  Moving slowly or fidgety/restless 0  0  Suicidal thoughts 0  0  PHQ-9 Score 0  0  Difficult doing work/chores Not difficult at all  Not difficult at all      08/13/2024    1:58 PM 06/27/2024    5:35 PM 03/26/2024    2:03 PM 02/07/2024    2:04 PM  GAD 7 : Generalized Anxiety Score  Nervous, Anxious, on Edge 0 0 0 0  Control/stop worrying 0 0 0 0  Worry too much - different things 0 0 0 0  Trouble relaxing 0 0  0 0  Restless 0 0 0 0  Easily annoyed or irritable 0 0 0 0  Afraid - awful might happen 0 0 0 0  Total GAD 7 Score 0 0 0 0  Anxiety Difficulty Not difficult at all Not difficult at all Not difficult at all Not difficult at all   SDOH Screenings   Food Insecurity: No Food Insecurity (07/16/2024)  Housing: Low Risk (07/16/2024)  Transportation Needs: No Transportation Needs (07/16/2024)  Utilities: Not At Risk (07/16/2024)  Alcohol Screen: Low Risk (07/16/2024)  Depression (PHQ2-9): Low Risk (08/13/2024)  Financial Resource Strain: Low Risk (07/16/2024)  Physical Activity: Inactive (07/16/2024)  Social Connections: Moderately Integrated (07/16/2024)  Stress: No Stress Concern Present (07/16/2024)  Tobacco Use: Low Risk (08/13/2024)  Health Literacy: Adequate Health Literacy (07/16/2024)     Physical Exam Constitutional:      General: He is not in acute distress.    Appearance: He is not ill-appearing.  HENT:     Head: Normocephalic and atraumatic.  Cardiovascular:     Rate and Rhythm: Normal rate.  Pulmonary:     Effort: Pulmonary effort is normal.     Breath sounds: Normal breath sounds.  Abdominal:     General: Bowel sounds are normal.     Palpations: Abdomen is soft.     Tenderness: There is no guarding.  Musculoskeletal:     Cervical back: Neck supple.     Right lower leg: No edema.     Left lower leg: No edema.  Skin:    General: Skin is warm.  Neurological:     Mental Status: He is alert and oriented to person, place, and time.  Psychiatric:        Attention and Perception: Attention normal.        Mood and Affect: Mood normal.        Speech: Speech normal.        Behavior: Behavior is cooperative.        Results for orders placed or performed in visit on 08/13/24  CBC with Differential (Cancer Center Only)  Result Value Ref Range   WBC Count 4.1 4.0 - 10.5 K/uL  RBC 3.72 (L) 4.22 - 5.81 MIL/uL   Hemoglobin 12.8 (L) 13.0 - 17.0 g/dL   HCT 61.5 (L) 60.9 - 47.9 %    MCV 103.2 (H) 80.0 - 100.0 fL   MCH 34.4 (H) 26.0 - 34.0 pg   MCHC 33.3 30.0 - 36.0 g/dL   RDW 84.4 88.4 - 84.4 %   Platelet Count 171 150 - 400 K/uL   nRBC 0.0 0.0 - 0.2 %   Neutrophils Relative % 45 %   Neutro Abs 1.9 1.7 - 7.7 K/uL   Lymphocytes Relative 43 %   Lymphs Abs 1.8 0.7 - 4.0 K/uL   Monocytes Relative 7 %   Monocytes Absolute 0.3 0.1 - 1.0 K/uL   Eosinophils Relative 3 %   Eosinophils Absolute 0.1 0.0 - 0.5 K/uL   Basophils Relative 1 %   Basophils Absolute 0.0 0.0 - 0.1 K/uL   Immature Granulocytes 1 %   Abs Immature Granulocytes 0.02 0.00 - 0.07 K/uL  Basic Metabolic Panel - Cancer Center Only  Result Value Ref Range   Sodium 135 135 - 145 mmol/L   Potassium 3.5 3.5 - 5.1 mmol/L   Chloride 105 98 - 111 mmol/L   CO2 21 (L) 22 - 32 mmol/L   Glucose, Bld 91 70 - 99 mg/dL   BUN 15 8 - 23 mg/dL   Creatinine 8.77 9.38 - 1.24 mg/dL   Calcium  8.7 (L) 8.9 - 10.3 mg/dL   GFR, Estimated >39 >39 mL/min   Anion gap 9 5 - 15    The 10-year ASCVD risk score (Arnett DK, et al., 2019) is: 39.6%     Assessment & Plan:   Assessment & Plan Type 2 diabetes mellitus with other specified complication, without long-term current use of insulin  (HCC) Nephropathy, neuropathy, dyslipidemia, hypertension Orders:   HgB A1c; Future   Urine Microalbumin w/creat. ratio; Future  Type II diabetes mellitus with nephropathy (HCC) Repeat A1c, urine microalbumin after 08/14/2024.  Recommend nephrology evaluation by Dr. Dennise (previously seen by nephrologist Dr. Dennise) if patient continues to have microalbuminuria.  Unable to tolerate SGLT-2 due to groin rash.  Continue Metformin  500 mg daily, Telmisartan  40 mg daily, Trajenta 5 mg daily.  Orders:   Urine Microalbumin w/creat. ratio; Future  Gastroesophageal reflux disease, unspecified whether esophagitis present Continue Nexium  40 mg daily and Carafate  1 gm four times a day.  Recommend reducing intake of Aspirin 325 mg.  Orders:    esomeprazole  (NEXIUM ) 40 MG capsule; Take 1 capsule (40 mg total) by mouth daily.  Mixed hyperlipidemia Continue Crestor  40 mg daily, Zetia  10 mg daily.     Breast pain, left Diagnostic mammogram ordered by hemeonc, patient provided with phone number to schedule appointment for mammogram if he does not hear for appointment within next 24-48 hours.  Hold off Finasteride  5 mg for BPH until appointment with urologist to discuss potential alternative options as this can contribute to gynecomastia.     Degeneration of intervertebral disc of lumbar region, unspecified whether pain present Continue Gabapentin  300 mg BID for lower back pain and continue follow up with pain clinic for pain management.     Other osteoporosis without current pathological fracture On Prolia  injection twice a year, continue follow up with Dr. Solum for osteoporosis management.      Essential hypertension, benign Patient's blood pressure on arrival was elevated. However, repeat blood pressure after 15 min rest was within goal. Given patient's age, co-morbidities, risk of fall, fragility recommend goal BP  140/80 mmHg or less.   Continue Telmisartan  40 mg daily.       Return in about 6 months (around 02/10/2025) for lab only appointment after 08/14/24, visit with Dr. Abbey in 6 months .   Luke Abbey, MD "

## 2024-08-14 ENCOUNTER — Ambulatory Visit: Admitting: Urology

## 2024-08-14 ENCOUNTER — Encounter: Payer: Self-pay | Admitting: Urology

## 2024-08-14 VITALS — BP 120/63 | HR 39 | Ht 65.0 in | Wt 143.0 lb

## 2024-08-14 DIAGNOSIS — N138 Other obstructive and reflux uropathy: Secondary | ICD-10-CM

## 2024-08-14 DIAGNOSIS — N401 Enlarged prostate with lower urinary tract symptoms: Secondary | ICD-10-CM | POA: Diagnosis not present

## 2024-08-14 LAB — URINALYSIS, COMPLETE
Glucose, UA: NEGATIVE
Leukocytes,UA: NEGATIVE
Nitrite, UA: NEGATIVE
RBC, UA: NEGATIVE
Specific Gravity, UA: 1.025 (ref 1.005–1.030)
Urobilinogen, Ur: 0.2 mg/dL (ref 0.2–1.0)
pH, UA: 5.5 (ref 5.0–7.5)

## 2024-08-14 LAB — MICROSCOPIC EXAMINATION: Epithelial Cells (non renal): >10 /HPF — AB (ref 0–10)

## 2024-08-14 LAB — BLADDER SCAN AMB NON-IMAGING

## 2024-08-14 NOTE — Progress Notes (Signed)
 "  08/14/2024 5:22 PM   Justin Patel [birth year] [id number]  Referring provider: Abbey Bruckner, MD [address] [place],  [place] [address]  Chief Complaint  Patient presents with   Benign Prostatic Hypertrophy    HPI: Justin Patel is a 74 y.o. male referred for evaluation of BPH.  I had seen him at [facility] for BPH in 2015 and then 2020 for ejaculatory dysfunction Had been on tamsulosin  for several years and was hospitalized January 2025 with orthostatic hypotension and tamsulosin  was discontinued at that time He was started on finasteride  09/2023 Has mild-moderate lower urinary tract symptoms consisting of nocturia x 2, occasional urgency and frequency.  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  removal scheduled to clear margins.   CHF (congestive heart failure) (HCC)    no longer after weight loss   Chronic abdominal pain 02/27/2019   Chronic kidney disease    cysts   Degenerative disc disease    Depression    Diabetes mellitus    no longer diabetic-or on meds   Diarrhea 07/22/2023   DJD (degenerative joint disease)    Dysplastic nevus 02/04/2016   R upper back paraspinal - severe   Dyspnea on exertion 08/24/2018   Family history of adverse reaction to anesthesia    sons wake up combative   Foot fracture 02/02/2022   Foot fracture, left 01/2022   Fracture of L1 vertebra (HCC) 01/14/2020   Gastric ulcer    GERD (gastroesophageal reflux disease)    Grief 03/15/2023   H/O hiatal hernia    Headache(784.0)    sinus   Heart murmur    History of fusion of thoracic spine  12/28/2021   History of gastric bypass    Hx of congestive heart failure    Hyperlipidemia    Hypertension    Intertrigo 02/24/2017   Iron  deficiency anemia 02/28/2015   Itchy skin 12/04/2019   Lumbar scoliosis    Numbness of face 05/22/2019   Opiate abuse, continuous (HCC) 06/20/2015   Orthostatic hypotension 09/08/2023   Otitis media 09/07/2023   Pneumonia 11/20/2022   Rash 03/11/2017   RLQ abdominal pain 05/18/2018   S/P laparoscopic cholecystectomy 02/18/2020   Sacral fracture, closed (HCC)    Seizures (HCC)    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, after GI bleed   Sleep apnea    hx not now since wt loss   Sore throat 03/15/2023   Stones in the urinary tract    Syncope and collapse    Transaminitis 09/07/2023   Tylenol  overdose, accidental or unintentional, initial encounter 09/07/2023   Vertigo 09/27/2023   Vitamin D  deficiency 07/20/2017    Surgical History: Past Surgical History:  Procedure Laterality Date   ANTERIOR CERVICAL DECOMP/DISCECTOMY FUSION  01/26/2012   Procedure: ANTERIOR CERVICAL DECOMPRESSION/DISCECTOMY FUSION 3 LEVELS;  Surgeon: Catalina CHRISTELLA Stains, MD;  Location: [facility];  Service: Neurosurgery;  Laterality: N/A;  Cervical three-four Cervical four-five Cervical five-six Cervical six-seven , Anterior cervical decompression/diskectomy, fusion, plate   ANTERIOR LAT LUMBAR FUSION Right 04/25/2018  Procedure: Right Lumbar two-three Lumbar three-four Lumbar four-five anterior  lateral interbody fusion  with posterior percutaneous pedicle screws;  Surgeon: Unice Pac, MD;  [facility];  Service: Neurosurgery;  Laterality: Right;   APPENDECTOMY     CARDIAC CATHETERIZATION     [facility], normal   CARDIAC CATHETERIZATION     [place]   CHOLECYSTECTOMY     COLONOSCOPY WITH PROPOFOL  N/A 12/27/2017   Procedure: COLONOSCOPY WITH PROPOFOL ;  Surgeon: Toledo, Ladell POUR, MD;  Location: [facility];  Service: Gastroenterology;  Laterality: N/A;    ESOPHAGOGASTRODUODENOSCOPY (EGD) WITH PROPOFOL  N/A 12/27/2017   Procedure: ESOPHAGOGASTRODUODENOSCOPY (EGD) WITH PROPOFOL ;  Surgeon: Toledo, Ladell POUR, MD;  Location: [facility];  Service: Gastroenterology;  Laterality: N/A;   ESOPHAGOGASTRODUODENOSCOPY (EGD) WITH PROPOFOL  N/A 06/19/2021   Procedure: ESOPHAGOGASTRODUODENOSCOPY (EGD) WITH PROPOFOL ;  Surgeon: Maryruth Ole DASEN, MD;  Location: [facility];  Service: Gastroenterology;  Laterality: N/A;   ESOPHAGOGASTRODUODENOSCOPY (EGD) WITH PROPOFOL  N/A 09/25/2021   Procedure: ESOPHAGOGASTRODUODENOSCOPY (EGD) WITH PROPOFOL ;  Surgeon: Maryruth Ole DASEN, MD;  Location: [facility] ENDOSCOPY;  Service: Endoscopy;  Laterality: N/A;   GASTRIC BYPASS  08/09/2008   [facility]   HARDWARE REMOVAL N/A 03/08/2022   Procedure: Removal of bilateral Thoracic ten pedicle screws;  Surgeon: Louis Shove, MD;  Location: [facility] OR;  Service: Neurosurgery;  Laterality: N/A;   HERNIA REPAIR  08/09/1998   hiatal   JOINT REPLACEMENT     KNEE ARTHROSCOPY     bilateral, left x 2   LAMINECTOMY WITH POSTERIOR LATERAL ARTHRODESIS LEVEL 4 N/A 01/14/2020   Procedure: Decompressive Laminectomy Lumbar One with pedicle screw fixation from Thoracic Ten to Lumbar Two;  Surgeon: Unice Pac, MD;  Location: [facility] OR;  Service: Neurosurgery;  Laterality: N/A;  Decompressive Laminectomy Lumbar One with pedicle screw fixation from Thoracic Ten to Lumbar Two   LUMBAR PERCUTANEOUS PEDICLE SCREW 3 LEVEL N/A 04/25/2018   Procedure: LUMBAR PERCUTANEOUS PEDICLE SCREW 3 LEVEL;  Surgeon: Unice Pac, MD;  Location: [facility] OR;  Service: Neurosurgery;  Laterality: N/A;   NASAL SINUS SURGERY     x5   OSTEOTOMY  08/10/1999   left   ROUX-EN-Y GASTRIC BYPASS     THORACIC LAMINECTOMY FOR EPIDURAL ABSCESS N/A 03/28/2022   Procedure: THORACIC WOUND WASHOUT;  Surgeon: Joshua Alm RAMAN, MD;  Location: [facility] OR;  Service: Neurosurgery;  Laterality: N/A;   TONSILLECTOMY     TOTAL KNEE ARTHROPLASTY Left  05/09/2014   Dr. Hooten   UVULOPALATOPLASTY  08/09/2009    Home Medications:  Allergies as of 08/14/2024       Reactions   Altace [ramipril] Anaphylaxis   Ace Inhibitors Hives   Levaquin [levofloxacin In D5w] Hives   Lyrica [pregabalin] Other (See Comments)   Edema   Metformin  Diarrhea   High doses cause diarrhea.   Trulicity  [dulaglutide ] Nausea And Vomiting        Medication List        Accurate as of August 14, 2024  5:22 PM. If you have any questions, ask your nurse or doctor.          aspirin EC 325 MG tablet Take 325 mg by mouth daily as needed for moderate pain (pain score 4-6).   Butrans 20 MCG/HR Ptwk Generic drug: buprenorphine Place 1 patch onto the skin once a week.   CALCIUM  600/VITAMIN D  PO Take 1 tablet by mouth daily after supper.   esomeprazole  40 MG capsule Commonly known as: NEXIUM  Take 1 capsule (40 mg total) by mouth  daily.   ezetimibe  10 MG tablet Commonly known as: ZETIA  Take 1 tablet (10 mg total) by mouth daily.   finasteride  5 MG tablet Commonly known as: PROSCAR  Take 1 tablet (5 mg total) by mouth daily.   fluticasone  50 MCG/ACT nasal spray Commonly known as: FLONASE  Place 2 sprays into both nostrils daily.   gabapentin  300 MG capsule Commonly known as: NEURONTIN  Take 1 capsule (300 mg total) by mouth 2 (two) times daily.   linagliptin  5 MG Tabs tablet Commonly known as: Tradjenta  Take 1 tablet (5 mg total) by mouth daily.   metFORMIN  500 MG 24 hr tablet Commonly known as: GLUCOPHAGE -XR Take 1 tablet (500 mg total) by mouth daily with breakfast.   metroNIDAZOLE  0.75 % gel Commonly known as: METROGEL  Apply 1 Application topically at bedtime. qhs to face for Rosacea   multivitamin capsule Take 1 capsule by mouth daily. With copper  and zinc    primidone  50 MG tablet Commonly known as: MYSOLINE  TAKE 1 TABLET BY MOUTH AT BEDTIME   Prolia  60 MG/ML Sosy injection Generic drug: denosumab  Inject 60 mg into the skin every  6 (six) months.   rosuvastatin  40 MG tablet Commonly known as: CRESTOR  Take 1 tablet (40 mg total) by mouth at bedtime.   sucralfate  1 g tablet Commonly known as: CARAFATE  Take 1 tablet (1 g total) by mouth 4 (four) times daily.   telmisartan  40 MG tablet Commonly known as: MICARDIS  Take 1 tablet (40 mg total) by mouth daily.   venlafaxine  XR 75 MG 24 hr capsule Commonly known as: EFFEXOR -XR Take 1 capsule (75 mg total) by mouth daily.        Allergies: Allergies[1]  Family History: Family History  Problem Relation Age of Onset   Dementia Mother 46   Osteoporosis Mother    Heart disease Father 14   Diabetes Father    Lymphoma Sister        lymphoma, stage 4   Ovarian cancer Sister 69       Ovarian   Lupus Sister    Prostate cancer Maternal Uncle    Skin cancer Maternal Uncle    Tongue cancer Maternal Uncle        uncle died of heart attack   Alcoholism Maternal Uncle    Lung cancer Paternal Aunt        pat aunts x 4 died of lung cancer   Lung cancer Paternal Aunt    Lung cancer Paternal Aunt    Lung cancer Paternal Aunt    Mesothelioma Cousin        paternal cousin    Social History:  reports that he has never smoked. He has never used smokeless tobacco. He reports that he does not currently use alcohol. He reports that he does not use drugs.   Physical Exam: BP 120/63   Pulse (!) 39   Ht 5' 5 (1.651 m)   Wt 143 lb (64.9 kg)   BMI 23.80 kg/m   Constitutional:  Alert, No acute distress. HEENT: [place] AT Respiratory: Normal respiratory effort, no increased work of breathing. Psychiatric: Normal mood and affect.   Assessment & Plan:    1. BPH with LUTS Mild-moderate lower urinary tract symptoms.  Presently satisfied with his voiding pattern PVR today was 4 mL Continue finasteride  Will follow-up annually  2.  Prostate cancer screening On chart review after patient left his last PSA was in October 2020 and was 1.4.  Will contact patient regarding PSA  testing  Glendia JAYSON Barba, MD  [facility] [address], [address] [place], [place] [address] [phone]    [1]  Allergies Allergen Reactions   Altace [Ramipril] Anaphylaxis   Ace Inhibitors Hives   Levaquin [Levofloxacin In D5w] Hives   Lyrica [Pregabalin] Other (See Comments)    Edema   Metformin  Diarrhea    High doses cause diarrhea.    Trulicity  [Dulaglutide ] Nausea And Vomiting   "

## 2024-08-15 ENCOUNTER — Ambulatory Visit: Payer: Self-pay | Admitting: Urology

## 2024-08-15 NOTE — Progress Notes (Signed)
 Called patient and left message to return the call and also needs psa

## 2024-08-17 ENCOUNTER — Ambulatory Visit
Admission: RE | Admit: 2024-08-17 | Discharge: 2024-08-17 | Disposition: A | Source: Ambulatory Visit | Attending: Internal Medicine

## 2024-08-17 DIAGNOSIS — N6320 Unspecified lump in the left breast, unspecified quadrant: Secondary | ICD-10-CM

## 2024-08-17 DIAGNOSIS — N6342 Unspecified lump in left breast, subareolar: Secondary | ICD-10-CM | POA: Diagnosis present

## 2024-08-17 DIAGNOSIS — N62 Hypertrophy of breast: Secondary | ICD-10-CM | POA: Diagnosis not present

## 2024-08-17 DIAGNOSIS — N644 Mastodynia: Secondary | ICD-10-CM | POA: Diagnosis present

## 2024-08-22 LAB — OPHTHALMOLOGY REPORT-SCANNED

## 2024-08-23 ENCOUNTER — Ambulatory Visit: Payer: Self-pay

## 2024-08-23 NOTE — Progress Notes (Signed)
 Noted.  Jacklin Mascot, MD

## 2024-08-27 ENCOUNTER — Other Ambulatory Visit: Payer: Self-pay

## 2024-08-27 DIAGNOSIS — N401 Enlarged prostate with lower urinary tract symptoms: Secondary | ICD-10-CM

## 2024-09-10 ENCOUNTER — Other Ambulatory Visit

## 2024-09-11 ENCOUNTER — Other Ambulatory Visit

## 2024-09-11 DIAGNOSIS — N138 Other obstructive and reflux uropathy: Secondary | ICD-10-CM | POA: Diagnosis not present

## 2024-09-11 DIAGNOSIS — N401 Enlarged prostate with lower urinary tract symptoms: Secondary | ICD-10-CM | POA: Diagnosis not present

## 2024-09-12 LAB — URINALYSIS, COMPLETE
Bilirubin, UA: NEGATIVE
Glucose, UA: NEGATIVE
Ketones, UA: NEGATIVE
Leukocytes,UA: NEGATIVE
Nitrite, UA: NEGATIVE
RBC, UA: NEGATIVE
Specific Gravity, UA: 1.027 (ref 1.005–1.030)
Urobilinogen, Ur: 1 mg/dL (ref 0.2–1.0)
pH, UA: 5 (ref 5.0–7.5)

## 2024-09-12 LAB — MICROSCOPIC EXAMINATION
Bacteria, UA: NONE SEEN
Casts: NONE SEEN /LPF
RBC, Urine: NONE SEEN /HPF (ref 0–2)

## 2024-09-12 LAB — PSA: Prostate Specific Ag, Serum: 0.2 ng/mL (ref 0.0–4.0)

## 2024-09-13 ENCOUNTER — Telehealth: Payer: Self-pay

## 2024-09-13 NOTE — Telephone Encounter (Signed)
 Copied from CRM [id number]. Topic: Clinical - Lab/Test Results >> Sep 13, 2024 12:08 PM Roselie BROCKS wrote: Reason for CRM: Patient called for lab results. And request a return call once read.

## 2024-09-14 ENCOUNTER — Other Ambulatory Visit

## 2024-09-14 ENCOUNTER — Ambulatory Visit: Payer: Self-pay | Admitting: Urology

## 2024-12-11 ENCOUNTER — Inpatient Hospital Stay

## 2024-12-11 ENCOUNTER — Inpatient Hospital Stay: Admitting: Internal Medicine

## 2024-12-24 ENCOUNTER — Ambulatory Visit: Admitting: Neurology

## 2025-01-01 ENCOUNTER — Ambulatory Visit: Admitting: Neurology

## 2025-02-13 ENCOUNTER — Ambulatory Visit

## 2025-07-22 ENCOUNTER — Ambulatory Visit

## 2025-08-14 ENCOUNTER — Ambulatory Visit: Admitting: Urology
